# Patient Record
Sex: Female | Born: 1960 | Race: Black or African American | Hispanic: No | Marital: Single | Smoking: Current every day smoker
Health system: Southern US, Community
[De-identification: ages and names within clinical notes are randomized; demographics above are authoritative.]

## PROBLEM LIST (undated history)

## (undated) DIAGNOSIS — E1140 Type 2 diabetes mellitus with diabetic neuropathy, unspecified: Secondary | ICD-10-CM

## (undated) DIAGNOSIS — E669 Obesity, unspecified: Secondary | ICD-10-CM

## (undated) DIAGNOSIS — M76899 Other specified enthesopathies of unspecified lower limb, excluding foot: Secondary | ICD-10-CM

## (undated) DIAGNOSIS — I10 Essential (primary) hypertension: Secondary | ICD-10-CM

## (undated) DIAGNOSIS — E119 Type 2 diabetes mellitus without complications: Secondary | ICD-10-CM

## (undated) DIAGNOSIS — M2240 Chondromalacia patellae, unspecified knee: Secondary | ICD-10-CM

## (undated) DIAGNOSIS — M545 Low back pain, unspecified: Secondary | ICD-10-CM

## (undated) DIAGNOSIS — K219 Gastro-esophageal reflux disease without esophagitis: Secondary | ICD-10-CM

## (undated) DIAGNOSIS — F1910 Other psychoactive substance abuse, uncomplicated: Secondary | ICD-10-CM

## (undated) DIAGNOSIS — D126 Benign neoplasm of colon, unspecified: Secondary | ICD-10-CM

## (undated) DIAGNOSIS — I340 Nonrheumatic mitral (valve) insufficiency: Secondary | ICD-10-CM

## (undated) DIAGNOSIS — B182 Chronic viral hepatitis C: Secondary | ICD-10-CM

## (undated) DIAGNOSIS — G4700 Insomnia, unspecified: Secondary | ICD-10-CM

## (undated) DIAGNOSIS — M1990 Unspecified osteoarthritis, unspecified site: Secondary | ICD-10-CM

## (undated) DIAGNOSIS — F32A Depression, unspecified: Secondary | ICD-10-CM

## (undated) DIAGNOSIS — F419 Anxiety disorder, unspecified: Secondary | ICD-10-CM

## (undated) DIAGNOSIS — I499 Cardiac arrhythmia, unspecified: Secondary | ICD-10-CM

## (undated) DIAGNOSIS — I071 Rheumatic tricuspid insufficiency: Secondary | ICD-10-CM

## (undated) DIAGNOSIS — B1920 Unspecified viral hepatitis C without hepatic coma: Secondary | ICD-10-CM

## (undated) DIAGNOSIS — M542 Cervicalgia: Secondary | ICD-10-CM

## (undated) DIAGNOSIS — M797 Fibromyalgia: Secondary | ICD-10-CM

## (undated) DIAGNOSIS — M25529 Pain in unspecified elbow: Secondary | ICD-10-CM

## (undated) DIAGNOSIS — M1710 Unilateral primary osteoarthritis, unspecified knee: Secondary | ICD-10-CM

## (undated) DIAGNOSIS — F329 Major depressive disorder, single episode, unspecified: Secondary | ICD-10-CM

## (undated) DIAGNOSIS — G8929 Other chronic pain: Secondary | ICD-10-CM

## (undated) DIAGNOSIS — G894 Chronic pain syndrome: Secondary | ICD-10-CM

## (undated) DIAGNOSIS — Z720 Tobacco use: Secondary | ICD-10-CM

## (undated) DIAGNOSIS — I509 Heart failure, unspecified: Secondary | ICD-10-CM

## (undated) HISTORY — DX: Chondromalacia patellae, unspecified knee: M22.40

## (undated) HISTORY — DX: Obesity, unspecified: E66.9

## (undated) HISTORY — DX: Cervicalgia: M54.2

## (undated) HISTORY — DX: Tobacco use: Z72.0

## (undated) HISTORY — DX: Type 2 diabetes mellitus without complications: E11.9

## (undated) HISTORY — DX: Other psychoactive substance abuse, uncomplicated: F19.10

## (undated) HISTORY — DX: Essential (primary) hypertension: I10

## (undated) HISTORY — DX: Chronic pain syndrome: G89.4

## (undated) HISTORY — DX: Low back pain: M54.5

## (undated) HISTORY — DX: Major depressive disorder, single episode, unspecified: F32.9

## (undated) HISTORY — DX: Chronic viral hepatitis C: B18.2

## (undated) HISTORY — DX: Unspecified viral hepatitis C without hepatic coma: B19.20

## (undated) HISTORY — DX: Other chronic pain: G89.29

## (undated) HISTORY — DX: Low back pain, unspecified: M54.50

## (undated) HISTORY — PX: FOOT SURGERY: SHX648

## (undated) HISTORY — DX: Unilateral primary osteoarthritis, unspecified knee: M17.10

## (undated) HISTORY — PX: ABDOMINAL HYSTERECTOMY: SHX81

## (undated) HISTORY — DX: Insomnia, unspecified: G47.00

## (undated) HISTORY — DX: Other specified enthesopathies of unspecified lower limb, excluding foot: M76.899

## (undated) HISTORY — DX: Depression, unspecified: F32.A

## (undated) HISTORY — DX: Benign neoplasm of colon, unspecified: D12.6

## (undated) HISTORY — DX: Unspecified osteoarthritis, unspecified site: M19.90

## (undated) HISTORY — DX: Type 2 diabetes mellitus with diabetic neuropathy, unspecified: E11.40

## (undated) HISTORY — DX: Pain in unspecified elbow: M25.529

## (undated) HISTORY — PX: EYE SURGERY: SHX253

---

## 1998-05-03 ENCOUNTER — Emergency Department (HOSPITAL_COMMUNITY): Admission: EM | Admit: 1998-05-03 | Discharge: 1998-05-03 | Payer: Self-pay | Admitting: Emergency Medicine

## 1998-05-09 ENCOUNTER — Encounter: Admission: RE | Admit: 1998-05-09 | Discharge: 1998-05-09 | Payer: Self-pay | Admitting: Internal Medicine

## 1998-05-16 ENCOUNTER — Encounter: Admission: RE | Admit: 1998-05-16 | Discharge: 1998-05-16 | Payer: Self-pay | Admitting: Internal Medicine

## 1998-05-26 ENCOUNTER — Encounter: Admission: RE | Admit: 1998-05-26 | Discharge: 1998-05-26 | Payer: Self-pay | Admitting: Internal Medicine

## 1998-06-06 ENCOUNTER — Encounter: Admission: RE | Admit: 1998-06-06 | Discharge: 1998-06-06 | Payer: Self-pay | Admitting: Internal Medicine

## 1998-06-25 ENCOUNTER — Encounter: Admission: RE | Admit: 1998-06-25 | Discharge: 1998-06-25 | Payer: Self-pay | Admitting: Internal Medicine

## 1998-07-09 ENCOUNTER — Encounter: Admission: RE | Admit: 1998-07-09 | Discharge: 1998-07-09 | Payer: Self-pay

## 1998-12-24 ENCOUNTER — Encounter: Admission: RE | Admit: 1998-12-24 | Discharge: 1998-12-24 | Payer: Self-pay | Admitting: Hematology and Oncology

## 1999-02-27 ENCOUNTER — Encounter: Admission: RE | Admit: 1999-02-27 | Discharge: 1999-02-27 | Payer: Self-pay | Admitting: Internal Medicine

## 1999-03-03 ENCOUNTER — Encounter: Admission: RE | Admit: 1999-03-03 | Discharge: 1999-03-03 | Payer: Self-pay | Admitting: Internal Medicine

## 1999-05-22 ENCOUNTER — Encounter: Admission: RE | Admit: 1999-05-22 | Discharge: 1999-05-22 | Payer: Self-pay | Admitting: Internal Medicine

## 1999-10-28 ENCOUNTER — Ambulatory Visit (HOSPITAL_COMMUNITY): Admission: RE | Admit: 1999-10-28 | Discharge: 1999-10-28 | Payer: Self-pay | Admitting: Internal Medicine

## 1999-10-28 ENCOUNTER — Encounter: Payer: Self-pay | Admitting: Internal Medicine

## 1999-10-28 ENCOUNTER — Encounter: Admission: RE | Admit: 1999-10-28 | Discharge: 1999-10-28 | Payer: Self-pay | Admitting: Internal Medicine

## 1999-11-17 ENCOUNTER — Encounter: Admission: RE | Admit: 1999-11-17 | Discharge: 1999-11-17 | Payer: Self-pay | Admitting: Internal Medicine

## 2000-02-12 ENCOUNTER — Encounter: Admission: RE | Admit: 2000-02-12 | Discharge: 2000-02-12 | Payer: Self-pay | Admitting: Internal Medicine

## 2000-06-14 ENCOUNTER — Emergency Department (HOSPITAL_COMMUNITY): Admission: EM | Admit: 2000-06-14 | Discharge: 2000-06-14 | Payer: Self-pay | Admitting: *Deleted

## 2000-07-13 ENCOUNTER — Encounter: Admission: RE | Admit: 2000-07-13 | Discharge: 2000-07-13 | Payer: Self-pay | Admitting: Internal Medicine

## 2000-08-09 ENCOUNTER — Encounter: Admission: RE | Admit: 2000-08-09 | Discharge: 2000-08-09 | Payer: Self-pay | Admitting: Internal Medicine

## 2000-08-11 ENCOUNTER — Encounter: Admission: RE | Admit: 2000-08-11 | Discharge: 2000-11-09 | Payer: Self-pay | Admitting: Internal Medicine

## 2000-08-25 ENCOUNTER — Encounter: Admission: RE | Admit: 2000-08-25 | Discharge: 2000-09-20 | Payer: Self-pay | Admitting: Orthopedic Surgery

## 2000-08-30 ENCOUNTER — Encounter: Admission: RE | Admit: 2000-08-30 | Discharge: 2000-08-30 | Payer: Self-pay | Admitting: Internal Medicine

## 2000-08-30 ENCOUNTER — Encounter: Payer: Self-pay | Admitting: Internal Medicine

## 2000-08-30 ENCOUNTER — Ambulatory Visit (HOSPITAL_COMMUNITY): Admission: RE | Admit: 2000-08-30 | Discharge: 2000-08-30 | Payer: Self-pay | Admitting: Internal Medicine

## 2000-10-25 ENCOUNTER — Encounter: Admission: RE | Admit: 2000-10-25 | Discharge: 2000-10-25 | Payer: Self-pay | Admitting: Internal Medicine

## 2000-11-04 ENCOUNTER — Encounter: Admission: RE | Admit: 2000-11-04 | Discharge: 2000-11-04 | Payer: Self-pay | Admitting: Internal Medicine

## 2000-12-17 ENCOUNTER — Emergency Department (HOSPITAL_COMMUNITY): Admission: EM | Admit: 2000-12-17 | Discharge: 2000-12-17 | Payer: Self-pay | Admitting: Emergency Medicine

## 2000-12-19 ENCOUNTER — Encounter: Payer: Self-pay | Admitting: Internal Medicine

## 2000-12-19 ENCOUNTER — Ambulatory Visit (HOSPITAL_COMMUNITY): Admission: RE | Admit: 2000-12-19 | Discharge: 2000-12-19 | Payer: Self-pay | Admitting: Internal Medicine

## 2000-12-19 ENCOUNTER — Encounter: Admission: RE | Admit: 2000-12-19 | Discharge: 2000-12-19 | Payer: Self-pay | Admitting: Internal Medicine

## 2000-12-26 ENCOUNTER — Encounter: Admission: RE | Admit: 2000-12-26 | Discharge: 2001-03-26 | Payer: Self-pay | Admitting: Internal Medicine

## 2001-01-10 ENCOUNTER — Encounter: Admission: RE | Admit: 2001-01-10 | Discharge: 2001-01-10 | Payer: Self-pay | Admitting: Internal Medicine

## 2001-02-15 ENCOUNTER — Encounter: Payer: Self-pay | Admitting: Internal Medicine

## 2001-02-15 ENCOUNTER — Encounter: Admission: RE | Admit: 2001-02-15 | Discharge: 2001-02-15 | Payer: Self-pay | Admitting: Internal Medicine

## 2001-03-17 ENCOUNTER — Encounter: Admission: RE | Admit: 2001-03-17 | Discharge: 2001-03-17 | Payer: Self-pay

## 2001-03-28 ENCOUNTER — Encounter: Admission: RE | Admit: 2001-03-28 | Discharge: 2001-06-26 | Payer: Self-pay | Admitting: Internal Medicine

## 2001-05-16 ENCOUNTER — Encounter: Admission: RE | Admit: 2001-05-16 | Discharge: 2001-05-16 | Payer: Self-pay | Admitting: Internal Medicine

## 2001-05-26 ENCOUNTER — Encounter: Payer: Self-pay | Admitting: Internal Medicine

## 2001-05-26 ENCOUNTER — Ambulatory Visit (HOSPITAL_COMMUNITY): Admission: RE | Admit: 2001-05-26 | Discharge: 2001-05-26 | Payer: Self-pay | Admitting: Internal Medicine

## 2001-06-14 ENCOUNTER — Encounter: Admission: RE | Admit: 2001-06-14 | Discharge: 2001-06-14 | Payer: Self-pay | Admitting: Internal Medicine

## 2001-07-18 ENCOUNTER — Encounter: Admission: RE | Admit: 2001-07-18 | Discharge: 2001-10-16 | Payer: Self-pay | Admitting: Internal Medicine

## 2001-08-14 ENCOUNTER — Encounter: Admission: RE | Admit: 2001-08-14 | Discharge: 2001-08-14 | Payer: Self-pay | Admitting: Internal Medicine

## 2001-09-01 ENCOUNTER — Encounter: Admission: RE | Admit: 2001-09-01 | Discharge: 2001-09-01 | Payer: Self-pay | Admitting: Internal Medicine

## 2001-09-05 ENCOUNTER — Encounter: Admission: RE | Admit: 2001-09-05 | Discharge: 2001-10-06 | Payer: Self-pay | Admitting: Orthopedic Surgery

## 2001-12-20 ENCOUNTER — Encounter: Payer: Self-pay | Admitting: Orthopedic Surgery

## 2001-12-20 ENCOUNTER — Ambulatory Visit (HOSPITAL_COMMUNITY): Admission: RE | Admit: 2001-12-20 | Discharge: 2001-12-20 | Payer: Self-pay | Admitting: Orthopedic Surgery

## 2001-12-22 ENCOUNTER — Encounter: Admission: RE | Admit: 2001-12-22 | Discharge: 2002-03-22 | Payer: Self-pay | Admitting: Internal Medicine

## 2002-01-15 ENCOUNTER — Encounter: Admission: RE | Admit: 2002-01-15 | Discharge: 2002-01-15 | Payer: Self-pay | Admitting: Internal Medicine

## 2002-01-26 ENCOUNTER — Emergency Department (HOSPITAL_COMMUNITY): Admission: EM | Admit: 2002-01-26 | Discharge: 2002-01-26 | Payer: Self-pay | Admitting: Emergency Medicine

## 2002-03-28 ENCOUNTER — Encounter: Admission: RE | Admit: 2002-03-28 | Discharge: 2002-06-26 | Payer: Self-pay | Admitting: Internal Medicine

## 2002-07-03 ENCOUNTER — Encounter: Admission: RE | Admit: 2002-07-03 | Discharge: 2002-10-01 | Payer: Self-pay | Admitting: Internal Medicine

## 2002-12-25 ENCOUNTER — Encounter: Admission: RE | Admit: 2002-12-25 | Discharge: 2003-03-25 | Payer: Self-pay | Admitting: Internal Medicine

## 2003-06-10 ENCOUNTER — Emergency Department (HOSPITAL_COMMUNITY): Admission: EM | Admit: 2003-06-10 | Discharge: 2003-06-10 | Payer: Self-pay | Admitting: Emergency Medicine

## 2003-06-10 ENCOUNTER — Encounter: Payer: Self-pay | Admitting: Emergency Medicine

## 2003-08-30 ENCOUNTER — Encounter (INDEPENDENT_AMBULATORY_CARE_PROVIDER_SITE_OTHER): Payer: Self-pay | Admitting: Specialist

## 2003-08-30 ENCOUNTER — Ambulatory Visit (HOSPITAL_BASED_OUTPATIENT_CLINIC_OR_DEPARTMENT_OTHER): Admission: RE | Admit: 2003-08-30 | Discharge: 2003-08-30 | Payer: Self-pay | Admitting: Otolaryngology

## 2003-08-30 ENCOUNTER — Ambulatory Visit (HOSPITAL_COMMUNITY): Admission: RE | Admit: 2003-08-30 | Discharge: 2003-08-30 | Payer: Self-pay | Admitting: Otolaryngology

## 2004-02-06 ENCOUNTER — Ambulatory Visit (HOSPITAL_BASED_OUTPATIENT_CLINIC_OR_DEPARTMENT_OTHER): Admission: RE | Admit: 2004-02-06 | Discharge: 2004-02-06 | Payer: Self-pay | Admitting: Orthopedic Surgery

## 2007-02-27 ENCOUNTER — Encounter (INDEPENDENT_AMBULATORY_CARE_PROVIDER_SITE_OTHER): Payer: Self-pay | Admitting: *Deleted

## 2007-02-27 ENCOUNTER — Ambulatory Visit: Payer: Self-pay | Admitting: Internal Medicine

## 2007-02-27 DIAGNOSIS — I152 Hypertension secondary to endocrine disorders: Secondary | ICD-10-CM | POA: Insufficient documentation

## 2007-02-27 DIAGNOSIS — E1169 Type 2 diabetes mellitus with other specified complication: Secondary | ICD-10-CM | POA: Insufficient documentation

## 2007-02-27 DIAGNOSIS — I10 Essential (primary) hypertension: Secondary | ICD-10-CM | POA: Insufficient documentation

## 2007-02-27 DIAGNOSIS — B1710 Acute hepatitis C without hepatic coma: Secondary | ICD-10-CM | POA: Insufficient documentation

## 2007-02-27 DIAGNOSIS — F329 Major depressive disorder, single episode, unspecified: Secondary | ICD-10-CM | POA: Insufficient documentation

## 2007-02-27 DIAGNOSIS — N898 Other specified noninflammatory disorders of vagina: Secondary | ICD-10-CM | POA: Insufficient documentation

## 2007-02-27 DIAGNOSIS — F3289 Other specified depressive episodes: Secondary | ICD-10-CM | POA: Insufficient documentation

## 2007-02-27 DIAGNOSIS — E119 Type 2 diabetes mellitus without complications: Secondary | ICD-10-CM | POA: Insufficient documentation

## 2007-02-27 LAB — CONVERTED CEMR LAB
AST: 44 units/L — ABNORMAL HIGH (ref 0–37)
Albumin: 4.1 g/dL (ref 3.5–5.2)
BUN: 19 mg/dL (ref 6–23)
Basophils Relative: 0 % (ref 0–1)
Blood Glucose, Fingerstick: 359
Blood in Urine, dipstick: NEGATIVE
Candida species: NEGATIVE
Chloride: 102 meq/L (ref 96–112)
Eosinophils Relative: 2 % (ref 0–5)
Glucose, Bld: 299 mg/dL — ABNORMAL HIGH (ref 70–99)
Hemoglobin: 12.9 g/dL (ref 12.0–15.0)
Hep B Core Total Ab: POSITIVE — AB
Hep B S Ab: POSITIVE — AB
Hgb A1c MFr Bld: 11 %
Lymphs Abs: 3.4 10*3/uL — ABNORMAL HIGH (ref 0.7–3.3)
Monocytes Absolute: 0.3 10*3/uL (ref 0.2–0.7)
Neutro Abs: 2.4 10*3/uL (ref 1.7–7.7)
Nitrite: NEGATIVE
RBC: 4.47 M/uL (ref 3.87–5.11)
RDW: 13.6 % (ref 11.5–14.0)
Specific Gravity, Urine: >=1.03
Total Bilirubin: 0.5 mg/dL (ref 0.3–1.2)
Total Protein: 7.2 g/dL (ref 6.0–8.3)
Trichomonal Vaginitis: NEGATIVE
WBC Urine, dipstick: NEGATIVE
pH: 5

## 2007-02-28 ENCOUNTER — Telehealth (INDEPENDENT_AMBULATORY_CARE_PROVIDER_SITE_OTHER): Payer: Self-pay | Admitting: *Deleted

## 2007-03-22 ENCOUNTER — Encounter (INDEPENDENT_AMBULATORY_CARE_PROVIDER_SITE_OTHER): Payer: Self-pay | Admitting: *Deleted

## 2007-03-22 ENCOUNTER — Ambulatory Visit: Payer: Self-pay | Admitting: Internal Medicine

## 2007-03-22 ENCOUNTER — Telehealth: Payer: Self-pay | Admitting: *Deleted

## 2007-03-22 LAB — CONVERTED CEMR LAB
BUN: 14 mg/dL (ref 6–23)
CO2: 24 meq/L (ref 19–32)
Calcium: 9.2 mg/dL (ref 8.4–10.5)
Chloride: 100 meq/L (ref 96–112)
Creatinine, Ser: 0.91 mg/dL (ref 0.40–1.20)

## 2007-03-23 ENCOUNTER — Encounter (INDEPENDENT_AMBULATORY_CARE_PROVIDER_SITE_OTHER): Payer: Self-pay | Admitting: *Deleted

## 2007-03-27 ENCOUNTER — Telehealth (INDEPENDENT_AMBULATORY_CARE_PROVIDER_SITE_OTHER): Payer: Self-pay | Admitting: *Deleted

## 2007-03-29 ENCOUNTER — Ambulatory Visit: Payer: Self-pay | Admitting: Internal Medicine

## 2007-03-29 ENCOUNTER — Encounter (INDEPENDENT_AMBULATORY_CARE_PROVIDER_SITE_OTHER): Payer: Self-pay | Admitting: *Deleted

## 2007-03-29 DIAGNOSIS — K117 Disturbances of salivary secretion: Secondary | ICD-10-CM | POA: Insufficient documentation

## 2007-03-29 LAB — CONVERTED CEMR LAB
BUN: 14 mg/dL (ref 6–23)
Bilirubin, Direct: 0.2 mg/dL (ref 0.0–0.3)
Calcium: 8.3 mg/dL — ABNORMAL LOW (ref 8.4–10.5)
Creatinine, Ser: 0.9 mg/dL (ref 0.40–1.20)
Total Bilirubin: 0.8 mg/dL (ref 0.3–1.2)

## 2007-04-06 ENCOUNTER — Ambulatory Visit: Payer: Self-pay | Admitting: Internal Medicine

## 2007-04-06 LAB — CONVERTED CEMR LAB: Blood Glucose, Fingerstick: 109

## 2007-04-18 ENCOUNTER — Encounter (INDEPENDENT_AMBULATORY_CARE_PROVIDER_SITE_OTHER): Payer: Self-pay | Admitting: Internal Medicine

## 2007-04-18 ENCOUNTER — Ambulatory Visit: Payer: Self-pay | Admitting: Hospitalist

## 2007-04-18 LAB — CONVERTED CEMR LAB
Blood Glucose, Fingerstick: 204
Creatinine, Urine: 88.5 mg/dL
Microalb Creat Ratio: 4.7 mg/g (ref 0.0–30.0)
Microalb, Ur: 0.42 mg/dL (ref 0.00–1.89)

## 2007-04-24 ENCOUNTER — Telehealth (INDEPENDENT_AMBULATORY_CARE_PROVIDER_SITE_OTHER): Payer: Self-pay | Admitting: *Deleted

## 2007-04-27 ENCOUNTER — Ambulatory Visit: Payer: Self-pay | Admitting: Internal Medicine

## 2007-04-28 ENCOUNTER — Telehealth (INDEPENDENT_AMBULATORY_CARE_PROVIDER_SITE_OTHER): Payer: Self-pay | Admitting: *Deleted

## 2007-05-08 ENCOUNTER — Ambulatory Visit (HOSPITAL_COMMUNITY): Admission: RE | Admit: 2007-05-08 | Discharge: 2007-05-08 | Payer: Self-pay | Admitting: Internal Medicine

## 2007-05-08 ENCOUNTER — Encounter (INDEPENDENT_AMBULATORY_CARE_PROVIDER_SITE_OTHER): Payer: Self-pay | Admitting: *Deleted

## 2007-05-08 ENCOUNTER — Ambulatory Visit: Payer: Self-pay | Admitting: Internal Medicine

## 2007-05-08 DIAGNOSIS — E1149 Type 2 diabetes mellitus with other diabetic neurological complication: Secondary | ICD-10-CM | POA: Insufficient documentation

## 2007-05-08 LAB — CONVERTED CEMR LAB: Hgb A1c MFr Bld: 9.1 %

## 2007-05-23 ENCOUNTER — Telehealth (INDEPENDENT_AMBULATORY_CARE_PROVIDER_SITE_OTHER): Payer: Self-pay | Admitting: *Deleted

## 2007-05-23 ENCOUNTER — Encounter (INDEPENDENT_AMBULATORY_CARE_PROVIDER_SITE_OTHER): Payer: Self-pay | Admitting: *Deleted

## 2007-05-23 ENCOUNTER — Ambulatory Visit: Payer: Self-pay | Admitting: Gastroenterology

## 2007-05-26 ENCOUNTER — Ambulatory Visit: Payer: Self-pay | Admitting: Internal Medicine

## 2007-05-26 ENCOUNTER — Encounter (INDEPENDENT_AMBULATORY_CARE_PROVIDER_SITE_OTHER): Payer: Self-pay | Admitting: *Deleted

## 2007-05-31 ENCOUNTER — Telehealth (INDEPENDENT_AMBULATORY_CARE_PROVIDER_SITE_OTHER): Payer: Self-pay | Admitting: *Deleted

## 2007-06-06 ENCOUNTER — Encounter: Payer: Self-pay | Admitting: Sports Medicine

## 2007-06-07 ENCOUNTER — Ambulatory Visit (HOSPITAL_COMMUNITY): Admission: RE | Admit: 2007-06-07 | Discharge: 2007-06-07 | Payer: Self-pay | Admitting: Obstetrics and Gynecology

## 2007-06-08 ENCOUNTER — Ambulatory Visit: Payer: Self-pay | Admitting: *Deleted

## 2007-06-08 ENCOUNTER — Ambulatory Visit (HOSPITAL_COMMUNITY): Admission: RE | Admit: 2007-06-08 | Discharge: 2007-06-08 | Payer: Self-pay | Admitting: Internal Medicine

## 2007-06-08 ENCOUNTER — Ambulatory Visit: Payer: Self-pay | Admitting: Internal Medicine

## 2007-06-08 DIAGNOSIS — G5600 Carpal tunnel syndrome, unspecified upper limb: Secondary | ICD-10-CM | POA: Insufficient documentation

## 2007-06-08 DIAGNOSIS — N951 Menopausal and female climacteric states: Secondary | ICD-10-CM | POA: Insufficient documentation

## 2007-06-08 LAB — CONVERTED CEMR LAB: Blood Glucose, Fingerstick: 63

## 2007-06-12 ENCOUNTER — Telehealth: Payer: Self-pay | Admitting: Internal Medicine

## 2007-06-15 ENCOUNTER — Encounter (INDEPENDENT_AMBULATORY_CARE_PROVIDER_SITE_OTHER): Payer: Self-pay | Admitting: *Deleted

## 2007-06-15 ENCOUNTER — Telehealth: Payer: Self-pay | Admitting: *Deleted

## 2007-06-19 ENCOUNTER — Telehealth (INDEPENDENT_AMBULATORY_CARE_PROVIDER_SITE_OTHER): Payer: Self-pay | Admitting: *Deleted

## 2007-06-22 ENCOUNTER — Telehealth: Payer: Self-pay | Admitting: *Deleted

## 2007-06-23 ENCOUNTER — Ambulatory Visit: Payer: Self-pay | Admitting: Hospitalist

## 2007-06-23 ENCOUNTER — Encounter (INDEPENDENT_AMBULATORY_CARE_PROVIDER_SITE_OTHER): Payer: Self-pay | Admitting: *Deleted

## 2007-06-23 LAB — CONVERTED CEMR LAB
BUN: 22 mg/dL (ref 6–23)
Calcium: 9 mg/dL (ref 8.4–10.5)
Creatinine, Ser: 1.17 mg/dL (ref 0.40–1.20)

## 2007-07-11 ENCOUNTER — Ambulatory Visit: Payer: Self-pay | Admitting: Sports Medicine

## 2007-07-11 DIAGNOSIS — M722 Plantar fascial fibromatosis: Secondary | ICD-10-CM | POA: Insufficient documentation

## 2007-07-17 ENCOUNTER — Telehealth: Payer: Self-pay | Admitting: *Deleted

## 2007-07-19 ENCOUNTER — Telehealth: Payer: Self-pay | Admitting: Infectious Disease

## 2007-08-04 ENCOUNTER — Telehealth: Payer: Self-pay | Admitting: Licensed Clinical Social Worker

## 2007-08-10 ENCOUNTER — Telehealth: Payer: Self-pay | Admitting: *Deleted

## 2007-08-18 ENCOUNTER — Ambulatory Visit: Payer: Self-pay | Admitting: Sports Medicine

## 2007-08-21 ENCOUNTER — Telehealth (INDEPENDENT_AMBULATORY_CARE_PROVIDER_SITE_OTHER): Payer: Self-pay | Admitting: *Deleted

## 2007-08-22 ENCOUNTER — Telehealth (INDEPENDENT_AMBULATORY_CARE_PROVIDER_SITE_OTHER): Payer: Self-pay | Admitting: *Deleted

## 2007-08-23 ENCOUNTER — Ambulatory Visit: Payer: Self-pay | Admitting: Sports Medicine

## 2007-08-30 ENCOUNTER — Encounter (INDEPENDENT_AMBULATORY_CARE_PROVIDER_SITE_OTHER): Payer: Self-pay | Admitting: *Deleted

## 2007-08-31 ENCOUNTER — Encounter (INDEPENDENT_AMBULATORY_CARE_PROVIDER_SITE_OTHER): Payer: Self-pay | Admitting: Internal Medicine

## 2007-08-31 ENCOUNTER — Ambulatory Visit: Payer: Self-pay | Admitting: Internal Medicine

## 2007-08-31 DIAGNOSIS — S2092XA Blister (nonthermal) of unspecified parts of thorax, initial encounter: Secondary | ICD-10-CM | POA: Insufficient documentation

## 2007-08-31 DIAGNOSIS — K146 Glossodynia: Secondary | ICD-10-CM | POA: Insufficient documentation

## 2007-08-31 DIAGNOSIS — H5710 Ocular pain, unspecified eye: Secondary | ICD-10-CM | POA: Insufficient documentation

## 2007-08-31 LAB — CONVERTED CEMR LAB: Blood Glucose, Fingerstick: 182

## 2007-09-01 ENCOUNTER — Telehealth: Payer: Self-pay | Admitting: *Deleted

## 2007-09-01 LAB — CONVERTED CEMR LAB
ALT: 44 units/L — ABNORMAL HIGH (ref 0–35)
AST: 34 units/L (ref 0–37)
CO2: 22 meq/L (ref 19–32)
Calcium: 9.4 mg/dL (ref 8.4–10.5)
Chloride: 103 meq/L (ref 96–112)
Creatinine, Ser: 1.04 mg/dL (ref 0.40–1.20)
HCT: 41.6 % (ref 36.0–46.0)
Platelets: 218 10*3/uL (ref 150–400)
RDW: 12.6 % (ref 11.5–14.0)
Sodium: 140 meq/L (ref 135–145)
TSH: 4.638 microintl units/mL (ref 0.350–5.50)
Total Bilirubin: 0.6 mg/dL (ref 0.3–1.2)
Total Protein: 7.7 g/dL (ref 6.0–8.3)
WBC: 8.5 10*3/uL (ref 4.0–10.5)

## 2007-09-07 ENCOUNTER — Telehealth: Payer: Self-pay | Admitting: Infectious Diseases

## 2007-09-08 ENCOUNTER — Telehealth: Payer: Self-pay | Admitting: *Deleted

## 2007-09-08 ENCOUNTER — Encounter (INDEPENDENT_AMBULATORY_CARE_PROVIDER_SITE_OTHER): Payer: Self-pay | Admitting: *Deleted

## 2007-09-14 ENCOUNTER — Ambulatory Visit: Payer: Self-pay | Admitting: *Deleted

## 2007-09-14 ENCOUNTER — Encounter (INDEPENDENT_AMBULATORY_CARE_PROVIDER_SITE_OTHER): Payer: Self-pay | Admitting: Infectious Diseases

## 2007-09-14 LAB — CONVERTED CEMR LAB
Anti Nuclear Antibody(ANA): NEGATIVE
Blood Glucose, Fingerstick: 87
Sed Rate: 16 mm/hr (ref 0–22)

## 2007-09-21 ENCOUNTER — Telehealth: Payer: Self-pay | Admitting: *Deleted

## 2007-09-21 ENCOUNTER — Telehealth (INDEPENDENT_AMBULATORY_CARE_PROVIDER_SITE_OTHER): Payer: Self-pay | Admitting: *Deleted

## 2007-09-21 ENCOUNTER — Emergency Department (HOSPITAL_COMMUNITY): Admission: EM | Admit: 2007-09-21 | Discharge: 2007-09-21 | Payer: Self-pay | Admitting: Emergency Medicine

## 2007-09-22 ENCOUNTER — Telehealth (INDEPENDENT_AMBULATORY_CARE_PROVIDER_SITE_OTHER): Payer: Self-pay | Admitting: *Deleted

## 2007-09-22 ENCOUNTER — Telehealth: Payer: Self-pay | Admitting: *Deleted

## 2007-09-25 ENCOUNTER — Telehealth: Payer: Self-pay | Admitting: *Deleted

## 2007-09-26 ENCOUNTER — Telehealth: Payer: Self-pay | Admitting: *Deleted

## 2007-09-30 ENCOUNTER — Encounter (INDEPENDENT_AMBULATORY_CARE_PROVIDER_SITE_OTHER): Payer: Self-pay | Admitting: *Deleted

## 2007-10-03 ENCOUNTER — Ambulatory Visit: Payer: Self-pay | Admitting: Sports Medicine

## 2007-10-03 ENCOUNTER — Encounter: Payer: Self-pay | Admitting: Internal Medicine

## 2007-10-03 DIAGNOSIS — B369 Superficial mycosis, unspecified: Secondary | ICD-10-CM | POA: Insufficient documentation

## 2007-10-05 ENCOUNTER — Encounter (INDEPENDENT_AMBULATORY_CARE_PROVIDER_SITE_OTHER): Payer: Self-pay | Admitting: *Deleted

## 2007-10-05 ENCOUNTER — Ambulatory Visit: Payer: Self-pay | Admitting: *Deleted

## 2007-10-05 LAB — CONVERTED CEMR LAB
ALT: 33 units/L (ref 0–35)
AST: 31 units/L (ref 0–37)
Alkaline Phosphatase: 87 units/L (ref 39–117)
Creatinine, Ser: 1.07 mg/dL (ref 0.40–1.20)
Sodium: 140 meq/L (ref 135–145)
Total Bilirubin: 0.4 mg/dL (ref 0.3–1.2)
Total Protein: 7.8 g/dL (ref 6.0–8.3)

## 2007-10-06 ENCOUNTER — Ambulatory Visit (HOSPITAL_COMMUNITY): Admission: RE | Admit: 2007-10-06 | Discharge: 2007-10-06 | Payer: Self-pay | Admitting: Gastroenterology

## 2007-10-06 ENCOUNTER — Encounter (INDEPENDENT_AMBULATORY_CARE_PROVIDER_SITE_OTHER): Payer: Self-pay | Admitting: Diagnostic Radiology

## 2007-10-11 ENCOUNTER — Encounter (INDEPENDENT_AMBULATORY_CARE_PROVIDER_SITE_OTHER): Payer: Self-pay | Admitting: *Deleted

## 2007-10-19 ENCOUNTER — Telehealth (INDEPENDENT_AMBULATORY_CARE_PROVIDER_SITE_OTHER): Payer: Self-pay | Admitting: *Deleted

## 2007-10-20 ENCOUNTER — Telehealth (INDEPENDENT_AMBULATORY_CARE_PROVIDER_SITE_OTHER): Payer: Self-pay | Admitting: *Deleted

## 2007-11-14 ENCOUNTER — Telehealth (INDEPENDENT_AMBULATORY_CARE_PROVIDER_SITE_OTHER): Payer: Self-pay | Admitting: *Deleted

## 2007-11-14 ENCOUNTER — Telehealth: Payer: Self-pay | Admitting: *Deleted

## 2007-11-14 ENCOUNTER — Ambulatory Visit: Payer: Self-pay | Admitting: Internal Medicine

## 2007-11-14 ENCOUNTER — Ambulatory Visit: Payer: Self-pay | Admitting: Sports Medicine

## 2007-11-27 ENCOUNTER — Telehealth: Payer: Self-pay | Admitting: *Deleted

## 2007-12-15 ENCOUNTER — Ambulatory Visit: Payer: Self-pay | Admitting: Infectious Disease

## 2007-12-15 ENCOUNTER — Ambulatory Visit: Payer: Self-pay | Admitting: Sports Medicine

## 2007-12-15 ENCOUNTER — Ambulatory Visit (HOSPITAL_COMMUNITY): Admission: RE | Admit: 2007-12-15 | Discharge: 2007-12-15 | Payer: Self-pay | Admitting: Internal Medicine

## 2007-12-15 DIAGNOSIS — M542 Cervicalgia: Secondary | ICD-10-CM | POA: Insufficient documentation

## 2007-12-15 DIAGNOSIS — G894 Chronic pain syndrome: Secondary | ICD-10-CM | POA: Insufficient documentation

## 2007-12-27 ENCOUNTER — Encounter (HOSPITAL_BASED_OUTPATIENT_CLINIC_OR_DEPARTMENT_OTHER): Admission: RE | Admit: 2007-12-27 | Discharge: 2008-02-15 | Payer: Self-pay | Admitting: Surgery

## 2008-01-01 ENCOUNTER — Encounter (INDEPENDENT_AMBULATORY_CARE_PROVIDER_SITE_OTHER): Payer: Self-pay | Admitting: *Deleted

## 2008-01-02 ENCOUNTER — Ambulatory Visit: Payer: Self-pay | Admitting: Gastroenterology

## 2008-01-08 ENCOUNTER — Telehealth: Payer: Self-pay | Admitting: *Deleted

## 2008-01-15 ENCOUNTER — Encounter: Admission: RE | Admit: 2008-01-15 | Discharge: 2008-01-15 | Payer: Self-pay | Admitting: Gastroenterology

## 2008-01-18 ENCOUNTER — Telehealth: Payer: Self-pay | Admitting: *Deleted

## 2008-01-18 ENCOUNTER — Ambulatory Visit: Payer: Self-pay | Admitting: Internal Medicine

## 2008-01-18 DIAGNOSIS — G4700 Insomnia, unspecified: Secondary | ICD-10-CM | POA: Insufficient documentation

## 2008-01-26 ENCOUNTER — Encounter (INDEPENDENT_AMBULATORY_CARE_PROVIDER_SITE_OTHER): Payer: Self-pay | Admitting: *Deleted

## 2008-01-26 ENCOUNTER — Ambulatory Visit: Payer: Self-pay | Admitting: Internal Medicine

## 2008-01-26 ENCOUNTER — Encounter: Payer: Self-pay | Admitting: Licensed Clinical Social Worker

## 2008-01-26 LAB — CONVERTED CEMR LAB: Blood Glucose, Fingerstick: 186

## 2008-01-31 ENCOUNTER — Telehealth: Payer: Self-pay | Admitting: *Deleted

## 2008-02-06 ENCOUNTER — Encounter (INDEPENDENT_AMBULATORY_CARE_PROVIDER_SITE_OTHER): Payer: Self-pay | Admitting: *Deleted

## 2008-02-12 ENCOUNTER — Telehealth (INDEPENDENT_AMBULATORY_CARE_PROVIDER_SITE_OTHER): Payer: Self-pay | Admitting: *Deleted

## 2008-02-14 ENCOUNTER — Telehealth (INDEPENDENT_AMBULATORY_CARE_PROVIDER_SITE_OTHER): Payer: Self-pay | Admitting: *Deleted

## 2008-02-20 ENCOUNTER — Telehealth: Payer: Self-pay | Admitting: *Deleted

## 2008-02-22 ENCOUNTER — Encounter: Payer: Self-pay | Admitting: Internal Medicine

## 2008-02-22 ENCOUNTER — Ambulatory Visit (HOSPITAL_COMMUNITY): Admission: RE | Admit: 2008-02-22 | Discharge: 2008-02-22 | Payer: Self-pay | Admitting: Hospitalist

## 2008-02-22 ENCOUNTER — Encounter (INDEPENDENT_AMBULATORY_CARE_PROVIDER_SITE_OTHER): Payer: Self-pay | Admitting: *Deleted

## 2008-02-22 ENCOUNTER — Ambulatory Visit: Payer: Self-pay | Admitting: Hospitalist

## 2008-02-22 DIAGNOSIS — R000 Tachycardia, unspecified: Secondary | ICD-10-CM | POA: Insufficient documentation

## 2008-02-22 LAB — CONVERTED CEMR LAB
ALT: 41 units/L — ABNORMAL HIGH (ref 0–35)
AST: 39 units/L — ABNORMAL HIGH (ref 0–37)
Alkaline Phosphatase: 100 units/L (ref 39–117)
Blood Glucose, Fingerstick: 174
Creatinine, Ser: 1.25 mg/dL — ABNORMAL HIGH (ref 0.40–1.20)
HDL: 48 mg/dL (ref >39)
Hgb A1c MFr Bld: 7.5 %
Total Bilirubin: 0.5 mg/dL (ref 0.3–1.2)
Total CHOL/HDL Ratio: 3.8
VLDL: 32 mg/dL (ref 0–40)

## 2008-02-27 ENCOUNTER — Ambulatory Visit: Payer: Self-pay | Admitting: Sports Medicine

## 2008-02-29 ENCOUNTER — Ambulatory Visit: Payer: Self-pay | Admitting: *Deleted

## 2008-03-18 ENCOUNTER — Encounter (INDEPENDENT_AMBULATORY_CARE_PROVIDER_SITE_OTHER): Payer: Self-pay | Admitting: *Deleted

## 2008-03-21 ENCOUNTER — Ambulatory Visit: Payer: Self-pay | Admitting: Gastroenterology

## 2008-03-22 ENCOUNTER — Telehealth: Payer: Self-pay | Admitting: *Deleted

## 2008-04-02 ENCOUNTER — Encounter (INDEPENDENT_AMBULATORY_CARE_PROVIDER_SITE_OTHER): Payer: Self-pay | Admitting: *Deleted

## 2008-04-02 ENCOUNTER — Ambulatory Visit: Payer: Self-pay | Admitting: Hospitalist

## 2008-04-02 LAB — CONVERTED CEMR LAB: Blood Glucose, Fingerstick: 147

## 2008-04-05 LAB — CONVERTED CEMR LAB
ALT: 38 units/L — ABNORMAL HIGH (ref 0–35)
Alkaline Phosphatase: 92 units/L (ref 39–117)
Creatinine, Ser: 1.14 mg/dL (ref 0.40–1.20)
Glucose, Bld: 78 mg/dL (ref 70–99)
Sodium: 140 meq/L (ref 135–145)
Total Bilirubin: 0.4 mg/dL (ref 0.3–1.2)
Total Protein: 7.8 g/dL (ref 6.0–8.3)

## 2008-04-08 ENCOUNTER — Telehealth (INDEPENDENT_AMBULATORY_CARE_PROVIDER_SITE_OTHER): Payer: Self-pay | Admitting: *Deleted

## 2008-04-08 ENCOUNTER — Encounter (INDEPENDENT_AMBULATORY_CARE_PROVIDER_SITE_OTHER): Payer: Self-pay | Admitting: *Deleted

## 2008-05-01 ENCOUNTER — Telehealth (INDEPENDENT_AMBULATORY_CARE_PROVIDER_SITE_OTHER): Payer: Self-pay | Admitting: *Deleted

## 2008-05-08 ENCOUNTER — Ambulatory Visit: Payer: Self-pay | Admitting: Internal Medicine

## 2008-05-08 ENCOUNTER — Encounter (INDEPENDENT_AMBULATORY_CARE_PROVIDER_SITE_OTHER): Payer: Self-pay | Admitting: *Deleted

## 2008-05-09 ENCOUNTER — Telehealth: Payer: Self-pay

## 2008-05-09 LAB — CONVERTED CEMR LAB
CO2: 24 meq/L (ref 19–32)
Calcium: 9.7 mg/dL (ref 8.4–10.5)
Chloride: 103 meq/L (ref 96–112)
Glucose, Bld: 158 mg/dL — ABNORMAL HIGH (ref 70–99)
Sodium: 142 meq/L (ref 135–145)

## 2008-05-10 ENCOUNTER — Encounter (INDEPENDENT_AMBULATORY_CARE_PROVIDER_SITE_OTHER): Payer: Self-pay | Admitting: *Deleted

## 2008-05-23 ENCOUNTER — Encounter (INDEPENDENT_AMBULATORY_CARE_PROVIDER_SITE_OTHER): Payer: Self-pay | Admitting: *Deleted

## 2008-06-14 ENCOUNTER — Ambulatory Visit (HOSPITAL_COMMUNITY): Admission: RE | Admit: 2008-06-14 | Discharge: 2008-06-14 | Payer: Self-pay | Admitting: *Deleted

## 2008-06-24 ENCOUNTER — Ambulatory Visit: Payer: Self-pay | Admitting: *Deleted

## 2008-06-24 ENCOUNTER — Encounter: Payer: Self-pay | Admitting: Internal Medicine

## 2008-06-24 LAB — CONVERTED CEMR LAB
Creatinine, Urine: 259.4 mg/dL
Microalb, Ur: 1.7 mg/dL (ref 0.00–1.89)

## 2008-06-25 ENCOUNTER — Encounter: Payer: Self-pay | Admitting: Internal Medicine

## 2008-06-26 ENCOUNTER — Encounter: Payer: Self-pay | Admitting: Internal Medicine

## 2008-07-02 ENCOUNTER — Telehealth (INDEPENDENT_AMBULATORY_CARE_PROVIDER_SITE_OTHER): Payer: Self-pay | Admitting: *Deleted

## 2008-07-03 ENCOUNTER — Encounter: Payer: Self-pay | Admitting: Licensed Clinical Social Worker

## 2008-07-09 ENCOUNTER — Telehealth: Payer: Self-pay | Admitting: *Deleted

## 2008-07-10 ENCOUNTER — Telehealth: Payer: Self-pay | Admitting: Internal Medicine

## 2008-07-29 ENCOUNTER — Ambulatory Visit: Payer: Self-pay | Admitting: Internal Medicine

## 2008-07-29 LAB — CONVERTED CEMR LAB: Blood Glucose, Fingerstick: 204

## 2008-08-05 ENCOUNTER — Ambulatory Visit: Payer: Self-pay | Admitting: Family Medicine

## 2008-08-05 ENCOUNTER — Encounter: Admission: RE | Admit: 2008-08-05 | Discharge: 2008-08-05 | Payer: Self-pay | Admitting: Internal Medicine

## 2008-08-06 ENCOUNTER — Encounter: Payer: Self-pay | Admitting: Family Medicine

## 2008-08-07 ENCOUNTER — Telehealth (INDEPENDENT_AMBULATORY_CARE_PROVIDER_SITE_OTHER): Payer: Self-pay | Admitting: *Deleted

## 2008-08-15 ENCOUNTER — Ambulatory Visit: Payer: Self-pay | Admitting: Internal Medicine

## 2008-08-15 ENCOUNTER — Encounter: Payer: Self-pay | Admitting: Internal Medicine

## 2008-08-15 ENCOUNTER — Encounter (INDEPENDENT_AMBULATORY_CARE_PROVIDER_SITE_OTHER): Payer: Self-pay | Admitting: Internal Medicine

## 2008-08-15 LAB — CONVERTED CEMR LAB
Blood Glucose, Fingerstick: 134
CO2: 23 meq/L (ref 19–32)
Calcium: 9.2 mg/dL (ref 8.4–10.5)
Creatinine, Ser: 0.91 mg/dL (ref 0.40–1.20)
Sodium: 140 meq/L (ref 135–145)

## 2008-08-26 ENCOUNTER — Telehealth: Payer: Self-pay | Admitting: *Deleted

## 2008-08-28 ENCOUNTER — Telehealth: Payer: Self-pay | Admitting: *Deleted

## 2008-08-30 ENCOUNTER — Ambulatory Visit: Payer: Self-pay | Admitting: Internal Medicine

## 2008-09-04 ENCOUNTER — Encounter: Admission: RE | Admit: 2008-09-04 | Discharge: 2008-09-05 | Payer: Self-pay | Admitting: Internal Medicine

## 2008-09-11 ENCOUNTER — Encounter: Admission: RE | Admit: 2008-09-11 | Discharge: 2008-11-20 | Payer: Self-pay | Admitting: Podiatry

## 2008-11-08 ENCOUNTER — Encounter: Payer: Self-pay | Admitting: Internal Medicine

## 2008-12-12 ENCOUNTER — Encounter: Payer: Self-pay | Admitting: Internal Medicine

## 2008-12-24 ENCOUNTER — Telehealth (INDEPENDENT_AMBULATORY_CARE_PROVIDER_SITE_OTHER): Payer: Self-pay | Admitting: *Deleted

## 2009-01-09 ENCOUNTER — Ambulatory Visit: Payer: Self-pay | Admitting: Sports Medicine

## 2009-01-09 ENCOUNTER — Ambulatory Visit: Payer: Self-pay | Admitting: *Deleted

## 2009-01-09 ENCOUNTER — Encounter: Payer: Self-pay | Admitting: Internal Medicine

## 2009-01-09 ENCOUNTER — Encounter: Admission: RE | Admit: 2009-01-09 | Discharge: 2009-01-09 | Payer: Self-pay | Admitting: Sports Medicine

## 2009-01-09 LAB — CONVERTED CEMR LAB
Blood Glucose, Fingerstick: 140
CO2: 23 meq/L (ref 19–32)
Chloride: 101 meq/L (ref 96–112)
Glucose, Bld: 95 mg/dL (ref 70–99)
HDL: 62 mg/dL (ref >39)
Hemoglobin, Urine: NEGATIVE
Hgb A1c MFr Bld: 5.7 %
LDL Cholesterol: 75 mg/dL (ref 0–99)
Nitrite: NEGATIVE
Protein, ur: NEGATIVE mg/dL
Sodium: 139 meq/L (ref 135–145)
Total CHOL/HDL Ratio: 2.6
Urobilinogen, UA: 1 (ref 0.0–1.0)

## 2009-01-13 ENCOUNTER — Telehealth: Payer: Self-pay | Admitting: Internal Medicine

## 2009-02-06 ENCOUNTER — Telehealth: Payer: Self-pay | Admitting: Internal Medicine

## 2009-02-07 ENCOUNTER — Encounter: Payer: Self-pay | Admitting: Internal Medicine

## 2009-02-07 ENCOUNTER — Telehealth: Payer: Self-pay | Admitting: Internal Medicine

## 2009-03-04 ENCOUNTER — Telehealth: Payer: Self-pay | Admitting: *Deleted

## 2009-03-24 ENCOUNTER — Ambulatory Visit: Payer: Self-pay | Admitting: Internal Medicine

## 2009-03-24 DIAGNOSIS — K029 Dental caries, unspecified: Secondary | ICD-10-CM | POA: Insufficient documentation

## 2009-03-26 ENCOUNTER — Encounter: Payer: Self-pay | Admitting: Internal Medicine

## 2009-03-31 ENCOUNTER — Telehealth: Payer: Self-pay | Admitting: Internal Medicine

## 2009-04-01 ENCOUNTER — Telehealth: Payer: Self-pay | Admitting: Internal Medicine

## 2009-04-02 ENCOUNTER — Telehealth: Payer: Self-pay | Admitting: Internal Medicine

## 2009-04-08 ENCOUNTER — Ambulatory Visit: Payer: Self-pay | Admitting: Sports Medicine

## 2009-04-14 ENCOUNTER — Telehealth: Payer: Self-pay | Admitting: Internal Medicine

## 2009-05-01 ENCOUNTER — Telehealth: Payer: Self-pay | Admitting: Internal Medicine

## 2009-05-14 ENCOUNTER — Telehealth: Payer: Self-pay | Admitting: *Deleted

## 2009-05-14 ENCOUNTER — Telehealth (INDEPENDENT_AMBULATORY_CARE_PROVIDER_SITE_OTHER): Payer: Self-pay | Admitting: Internal Medicine

## 2009-05-17 ENCOUNTER — Telehealth: Payer: Self-pay | Admitting: Internal Medicine

## 2009-06-04 ENCOUNTER — Encounter: Payer: Self-pay | Admitting: Internal Medicine

## 2009-06-16 ENCOUNTER — Telehealth: Payer: Self-pay | Admitting: Internal Medicine

## 2009-07-31 ENCOUNTER — Ambulatory Visit: Payer: Self-pay | Admitting: Sports Medicine

## 2009-08-12 ENCOUNTER — Telehealth: Payer: Self-pay | Admitting: Internal Medicine

## 2009-08-28 ENCOUNTER — Ambulatory Visit (HOSPITAL_COMMUNITY): Admission: RE | Admit: 2009-08-28 | Discharge: 2009-08-28 | Payer: Self-pay | Admitting: Internal Medicine

## 2009-09-04 ENCOUNTER — Ambulatory Visit: Payer: Self-pay | Admitting: Internal Medicine

## 2009-09-04 LAB — CONVERTED CEMR LAB: Blood Glucose, Fingerstick: 120

## 2009-09-08 ENCOUNTER — Telehealth: Payer: Self-pay | Admitting: Internal Medicine

## 2009-09-09 ENCOUNTER — Telehealth: Payer: Self-pay | Admitting: Internal Medicine

## 2009-09-10 ENCOUNTER — Telehealth: Payer: Self-pay | Admitting: Internal Medicine

## 2009-09-16 ENCOUNTER — Ambulatory Visit: Payer: Self-pay | Admitting: Sports Medicine

## 2009-09-16 DIAGNOSIS — M67919 Unspecified disorder of synovium and tendon, unspecified shoulder: Secondary | ICD-10-CM | POA: Insufficient documentation

## 2009-09-16 DIAGNOSIS — M719 Bursopathy, unspecified: Secondary | ICD-10-CM

## 2009-09-25 ENCOUNTER — Telehealth: Payer: Self-pay | Admitting: *Deleted

## 2009-09-25 ENCOUNTER — Ambulatory Visit: Payer: Self-pay | Admitting: Internal Medicine

## 2009-10-14 ENCOUNTER — Ambulatory Visit: Payer: Self-pay | Admitting: Sports Medicine

## 2009-11-04 ENCOUNTER — Telehealth: Payer: Self-pay | Admitting: *Deleted

## 2010-01-05 ENCOUNTER — Telehealth: Payer: Self-pay | Admitting: Internal Medicine

## 2010-01-12 ENCOUNTER — Telehealth: Payer: Self-pay | Admitting: Internal Medicine

## 2010-01-14 ENCOUNTER — Telehealth: Payer: Self-pay | Admitting: Internal Medicine

## 2010-01-30 ENCOUNTER — Ambulatory Visit: Payer: Self-pay | Admitting: Internal Medicine

## 2010-01-30 ENCOUNTER — Ambulatory Visit (HOSPITAL_COMMUNITY): Admission: RE | Admit: 2010-01-30 | Discharge: 2010-01-30 | Payer: Self-pay | Admitting: Internal Medicine

## 2010-01-30 LAB — CONVERTED CEMR LAB
ALT: 30 units/L (ref 0–35)
Basophils Relative: 0 % (ref 0–1)
Blood Glucose, Fingerstick: 212
CO2: 25 meq/L (ref 19–32)
Cholesterol: 146 mg/dL (ref 0–200)
Hgb A1c MFr Bld: 6.4 %
LDL Cholesterol: 63 mg/dL (ref 0–99)
Lymphocytes Relative: 50 % — ABNORMAL HIGH (ref 12–46)
Lymphs Abs: 4.1 10*3/uL — ABNORMAL HIGH (ref 0.7–4.0)
MCHC: 32.7 g/dL (ref 30.0–36.0)
Monocytes Relative: 6 % (ref 3–12)
Neutro Abs: 3.4 10*3/uL (ref 1.7–7.7)
Neutrophils Relative %: 42 % — ABNORMAL LOW (ref 43–77)
Potassium: 4.2 meq/L (ref 3.5–5.3)
RBC: 4.22 M/uL (ref 3.87–5.11)
Sodium: 141 meq/L (ref 135–145)
Total Bilirubin: 0.5 mg/dL (ref 0.3–1.2)
Total Protein: 7.1 g/dL (ref 6.0–8.3)
VLDL: 32 mg/dL (ref 0–40)
WBC: 8.2 10*3/uL (ref 4.0–10.5)

## 2010-02-03 ENCOUNTER — Ambulatory Visit: Payer: Self-pay | Admitting: Internal Medicine

## 2010-02-03 LAB — CONVERTED CEMR LAB
OCCULT 2: NEGATIVE
OCCULT 3: POSITIVE

## 2010-02-09 ENCOUNTER — Ambulatory Visit: Payer: Self-pay | Admitting: Sports Medicine

## 2010-04-03 ENCOUNTER — Telehealth: Payer: Self-pay | Admitting: Internal Medicine

## 2010-05-13 ENCOUNTER — Telehealth: Payer: Self-pay | Admitting: Internal Medicine

## 2010-05-21 ENCOUNTER — Telehealth: Payer: Self-pay | Admitting: Internal Medicine

## 2010-06-05 ENCOUNTER — Telehealth: Payer: Self-pay | Admitting: Internal Medicine

## 2010-06-09 ENCOUNTER — Telehealth: Payer: Self-pay | Admitting: *Deleted

## 2010-06-10 ENCOUNTER — Ambulatory Visit: Payer: Self-pay | Admitting: Cardiovascular Disease

## 2010-06-10 ENCOUNTER — Encounter: Payer: Self-pay | Admitting: Ophthalmology

## 2010-06-10 ENCOUNTER — Inpatient Hospital Stay (HOSPITAL_COMMUNITY): Admission: EM | Admit: 2010-06-10 | Discharge: 2010-06-12 | Payer: Self-pay | Admitting: Emergency Medicine

## 2010-06-10 ENCOUNTER — Ambulatory Visit: Payer: Self-pay | Admitting: Internal Medicine

## 2010-06-10 ENCOUNTER — Telehealth: Payer: Self-pay | Admitting: *Deleted

## 2010-06-12 ENCOUNTER — Encounter: Payer: Self-pay | Admitting: Internal Medicine

## 2010-06-15 ENCOUNTER — Telehealth: Payer: Self-pay | Admitting: Internal Medicine

## 2010-06-19 ENCOUNTER — Ambulatory Visit: Payer: Self-pay | Admitting: Internal Medicine

## 2010-06-19 ENCOUNTER — Encounter (INDEPENDENT_AMBULATORY_CARE_PROVIDER_SITE_OTHER): Payer: Self-pay | Admitting: *Deleted

## 2010-06-19 DIAGNOSIS — N3000 Acute cystitis without hematuria: Secondary | ICD-10-CM | POA: Insufficient documentation

## 2010-06-19 DIAGNOSIS — R55 Syncope and collapse: Secondary | ICD-10-CM | POA: Insufficient documentation

## 2010-06-24 ENCOUNTER — Telehealth: Payer: Self-pay | Admitting: Internal Medicine

## 2010-07-07 ENCOUNTER — Telehealth: Payer: Self-pay | Admitting: Internal Medicine

## 2010-07-11 ENCOUNTER — Ambulatory Visit (HOSPITAL_COMMUNITY): Admission: RE | Admit: 2010-07-11 | Discharge: 2010-07-11 | Payer: Self-pay | Admitting: Internal Medicine

## 2010-07-20 ENCOUNTER — Encounter (INDEPENDENT_AMBULATORY_CARE_PROVIDER_SITE_OTHER): Payer: Self-pay | Admitting: *Deleted

## 2010-07-21 ENCOUNTER — Ambulatory Visit: Payer: Self-pay | Admitting: Internal Medicine

## 2010-07-21 ENCOUNTER — Telehealth: Payer: Self-pay | Admitting: Internal Medicine

## 2010-07-21 LAB — CONVERTED CEMR LAB: Blood Glucose, Fingerstick: 43

## 2010-07-23 ENCOUNTER — Ambulatory Visit: Payer: Self-pay | Admitting: Gastroenterology

## 2010-07-27 ENCOUNTER — Encounter: Payer: Self-pay | Admitting: Internal Medicine

## 2010-08-03 ENCOUNTER — Ambulatory Visit: Payer: Self-pay | Admitting: Internal Medicine

## 2010-08-06 ENCOUNTER — Ambulatory Visit: Payer: Self-pay | Admitting: Gastroenterology

## 2010-08-06 DIAGNOSIS — D126 Benign neoplasm of colon, unspecified: Secondary | ICD-10-CM

## 2010-08-06 HISTORY — DX: Benign neoplasm of colon, unspecified: D12.6

## 2010-08-07 ENCOUNTER — Encounter: Payer: Self-pay | Admitting: Gastroenterology

## 2010-08-11 ENCOUNTER — Ambulatory Visit: Payer: Self-pay | Admitting: Sports Medicine

## 2010-08-11 ENCOUNTER — Encounter: Payer: Self-pay | Admitting: Internal Medicine

## 2010-08-12 ENCOUNTER — Telehealth: Payer: Self-pay | Admitting: Internal Medicine

## 2010-08-31 ENCOUNTER — Ambulatory Visit (HOSPITAL_COMMUNITY): Admission: RE | Admit: 2010-08-31 | Discharge: 2010-08-31 | Payer: Self-pay | Admitting: Internal Medicine

## 2010-09-10 ENCOUNTER — Telehealth: Payer: Self-pay | Admitting: *Deleted

## 2010-09-14 ENCOUNTER — Ambulatory Visit: Payer: Self-pay | Admitting: Internal Medicine

## 2010-09-14 LAB — CONVERTED CEMR LAB: Hgb A1c MFr Bld: 6.3 %

## 2010-09-25 ENCOUNTER — Telehealth: Payer: Self-pay | Admitting: Internal Medicine

## 2010-09-28 ENCOUNTER — Ambulatory Visit: Payer: Self-pay | Admitting: Sports Medicine

## 2010-10-12 ENCOUNTER — Ambulatory Visit: Payer: Self-pay | Admitting: Family Medicine

## 2010-10-13 ENCOUNTER — Telehealth: Payer: Self-pay | Admitting: Internal Medicine

## 2010-10-14 ENCOUNTER — Encounter: Payer: Self-pay | Admitting: Sports Medicine

## 2010-10-20 ENCOUNTER — Telehealth (INDEPENDENT_AMBULATORY_CARE_PROVIDER_SITE_OTHER): Payer: Self-pay | Admitting: *Deleted

## 2010-10-22 ENCOUNTER — Ambulatory Visit: Payer: Self-pay | Admitting: Sports Medicine

## 2010-11-02 ENCOUNTER — Ambulatory Visit: Payer: Self-pay | Admitting: Family Medicine

## 2010-11-03 ENCOUNTER — Telehealth: Payer: Self-pay | Admitting: Internal Medicine

## 2010-12-27 ENCOUNTER — Encounter: Payer: Self-pay | Admitting: Gastroenterology

## 2011-01-05 NOTE — Initial Assessments (Signed)
INTERNAL MEDICINE ADMISSION HISTORY AND PHYSICAL  Attending: Dr. Rogelia Boga First Contact: Dr. Anselm Jungling [phone] Second Contact: Dr. Threasa Beards [phone] PCP: Mliss Sax MD  CC: Syncope  HPI: This is a 50 y/o AA female with a past medical history significant for PSVT,  type 2 diabetes, hypertension, and depression, who presents with a two day history of syncopal events.  The patient endorses three such events to date.  The first occurred yesterday at 1pm while the patient was sitting at a doctors office.  After getting up she noted herself walking "side ways" which apparently is not uncommon for her and has been occurring intermittently for the last 3-4 years.  At that point the patient passed out and was unconscious for a few mins.  The patient passed out again around 3pm while she was walking down her steps.  The last episode occurred today when she was walking to catch a bus.  On each of the occasions, the patient passed out with no prodromal symptoms (including warmth, palpitations, dizziness), and awakened without a post ictal state a within a few minutes. The patient does not recall the events and is unsure of whether or not she hit her head.  Two of the three of these events were reportedly witnessed and bystanders informed her that she had no shaking or other signs of seizures.  The patient denies having bit her tongue nor having lost control of her bowel or bladder.   There are no apparent associated symptoms nor any particular triggers.  The pt denies feeling anxious prior to the events and also denies any chest pain, weakness, SOB, recent changes in her medications, blurred vision, slurred speech, or diaphoresis.  The patient does claim that she used to experience similar episodes for which she never saw a doctor between the ages of 66 and 49.  Also of note, according to Dr. Gracy Racer clinic note  6/09, pt had previously been hospitalized at [facility] for PSVT leading to hypotension.   Upon further  questioning, pt endorses a 4 month history of constant sharp 10/10 shoulder pain that is worsened by movement and relieved by nothing.   ALLERGIES: NKDA  PAST MEDICAL HISTORY: PSVT associated with hypotension in 2008 Fibromyalgia Depression Diabetes mellitus, type II- Diagnosed 5 years ago Hypertension Obesity Tobacco abuse Low back pain Cocaine use and etoh- sober since 2001 Hepatitis C, Diagnosed 3-4 years ago, untreated. Insomnia  MEDICATIONS: Current Meds:  GLUCOTROL XL 10 MG XR24H-TAB (GLIPIZIDE) Take 1 tablet by mouth once a day METFORMIN HCL 1000 MG TABS (METFORMIN HCL) Take 1 tablet by mouth two times a day CALCIUM 600 1500 MG TABS (CALCIUM CARBONATE) Take 1 tablet by mouth two times a day VITAMIN D 400 UNIT CAPS (CHOLECALCIFEROL) Take 1 tablet by mouth once a day TRUETRACK TEST  STRP (GLUCOSE BLOOD) to test blood sugar 2x/day CYMBALTA 60 MG CPEP (DULOXETINE HCL) Take 2 tablets by mouth once a day LIDODERM 5 %  PTCH (LIDOCAINE) Apply on affected area once a day for 12 hours only, then off for 12 hours. METOPROLOL TARTRATE 25 MG  TABS (METOPROLOL TARTRATE) Take 1 tablet by mouth two times a day BENICAR 20 MG TABS (OLMESARTAN MEDOXOMIL) Take 1 tablet by mouth once a day BACTROBAN 2 % OINT (MUPIROCIN) apply three times a day NEURONTIN 300 MG CAPS (GABAPENTIN) Take 2 caps three times a day ULTICARE INSULIN SYRINGE 30G X 5/16" 0.5 ML MISC (INSULIN SYRINGE-NEEDLE U-100) use 2-3 times per day when you are checking your sugar  level ULTICARE SHORT PEN NEEDLES 31G X 8 MM MISC (INSULIN PEN NEEDLE) use 2-3 times per day when you are checking your sugar level PRILOSEC OTC 20 MG TBEC (OMEPRAZOLE MAGNESIUM) Take 1 tablet by mouth once a day TRAZODONE HCL 100 MG TABS (TRAZODONE HCL) Take 1 tablet by mouth at bedtime WELBUTRIN dose unknown, by mouth BID TRAMADOL HCL 50 MG TABS (TRAMADOL HCL) 1 tab every 6 hrs as needed for pain VICODIN 5-500 MG TABS (HYDROCODONE-ACETAMINOPHEN) Twice a day  as needed for pain  SOCIAL HISTORY: Social History: currently unemployed, previously worked as a Lawyer, lives by herself. Has two sons, one of which recently passed away.   FAMILY HISTORY Family History: 1 son with schizophrenia is 73 son in prison is 22 - for drug dealing lost twins through miscarriage after physical abuse Son who died of heart attack at age 64.  GM - heart problems Mother died of uterine ca Father died in mva  ROS: Negative as per HPI.   VITALS: T: 97.9 P:73  BP:127/88  R: 20  O2SAT: 100%  ON:RA PHYSICAL EXAM: General:  alert, well-developed, and cooperative to examination.   Head:  normocephalic and atraumatic.   Eyes:  vision grossly intact, pupils equal, pupils round, pupils reactive to light, no injection and anicteric.   Mouth:  pharynx pink and moist, no erythema, and no exudates.   Neck:  supple, full ROM, no thyromegaly, no JVD, and no carotid bruits.   Lungs:  normal respiratory effort, no accessory muscle use, normal breath sounds, no crackles, and no wheezes.  Heart:  normal rate, regular rhythm, no murmur, no gallop, and no rub.   Abdomen:  soft, non-tender, normal bowel sounds, no distention, no guarding, no rebound tenderness, no hepatomegaly, and no splenomegaly.   Msk:  no joint swelling, no joint warmth, and no redness over joints.  Left shoulder is tender to palpation and passive ROM without noticable joint effusion or warmth.  Pulses:  2+ DP/PT pulses bilaterally Extremities:  No cyanosis, clubbing, edema  Neurologic:  alert & oriented X3, cranial nerves II-XII intact, strength normal in all extremities, sensation intact to light touch, and gait normal.   Skin:  turgor normal and no rashes.   Psych:  Oriented X3, memory intact for recent and remote, normally interactive, good eye contact, not anxious appearing, and not depressed  appearing.  LABS:  WBC                                      8.6               4.0-10.5         K/uL  RBC                                       4.00              3.87-5.11        MIL/uL  Hemoglobin (HGB)                         12.4              12.0-15.0        g/dL  Hematocrit (HCT)  37.4              36.0-46.0        %  MCV                                      93.4              78.0-100.0       fL  MCH -                                    31.0              26.0-34.0        pg  MCHC                                     33.2              30.0-36.0        g/dL  RDW                                      13.9              11.5-15.5        %  Platelet Count (PLT)                     189               150-400          K/uL  Neutrophils, %                           42         l      43-77            %  Lymphocytes, %                           48         h      12-46            %  Monocytes, %                             8                 3-12             %  Eosinophils, %                           2                 0-5              %  Basophils, %                             1  0-1              %  Neutrophils, Absolute                    3.6               1.7-7.7          K/uL  Lymphocytes, Absolute                    4.1        h      0.7-4.0          K/uL  Monocytes, Absolute                      0.7               0.1-1.0          K/uL  Eosinophils, Absolute                    0.1               0.0-0.7          K/uL  Basophils, Absolute                      0.0               0.0-0.1          K/uL   CKMB, POC                                <1.0       l      1.0-8.0          ng/mL  Troponin I, POC                          <0.05             0.00-0.09        ng/mL  Myoglobin, POC                           42.6              12-200           ng/mL   Sodium (NA)                              137               135-145          mEq/L  Potassium (K)                            4.3               3.5-5.1          mEq/L  Chloride                                 103               96-112            mEq/L  CO2                                      27                19-32            mEq/L  Glucose                                  80                70-99            mg/dL  BUN                                      15                6-23             mg/dL  Creatinine                               1.13              0.4-1.2          mg/dL  GFR, Est Non African American            51         l      >60              mL/min  GFR, Est African American                >60               >60              mL/min    Oversized comment, see footnote  1  Calcium                                  8.9               8.4-10.5         mg/dL  STUDIES:   Head CT without contrast: Findings: No evidence for acute hemorrhage, mass lesion, midline   shift, hydrocephalus or large infarct.  No acute bony abnormality.   The nasal septum is deviated towards the right side.  Chest Xray: IMPRESSION:   No acute finding.  EKG: Normal sinus rhythm with a rate of 96 bpm.  ASSESSMENT AND PLAN:  1. Syncope: DDX includes Cardiac causes (arrhythmia, ischemia, structural), absence seizure, medication effect (Neurontin, Metoprolol, insulin, cymbalta), orthostasis, vasovagal syncope,  psychiatric syncope, hypoglycemia, drug use.  Given past history of PTST associated with hypotension in 2016m, arrhythmia is being heavily considered. Unfortunately, further details of prior admission to [facility]  are unavailable at this time. No formal cardiology evaluation was done as far as were are aware after that hospitalization.     Plan:     -admit to telemetry unit to monitor for arrhythmia.     -EKG when pt arrives to floor.       -UDS given hx of cocaine use, will  evaluate to determine if drug           use could be contributing to etiology.     -2D echocardiogram       -hold parameters for hypertension medications and orthostasis.      -100 cc NS x 12 hours.     - will continue to monitor CBC and BMET     - will check TSH to  evaluate for hyperthyroidism which could          precipitate arrhythmia.      -will check carotid doplers.   2. Shoulder pain:  Pt has history of fibromyalgia.  Pain has been ongoing for the last 4 months will evaluate with shoulder x-ray and recommend outpatient work up if negative.   2. Type 2 diabetes: Last A1C 6.4 on 2/11.  Will get HGB A1C and continue home medications.   3. Hepatitis C: reportedly untreated will order hepatitis C viral load.    3. Chronic pain: continue home medications.   5. DEPRESSION/ANXIETY: Continue home medications  6. VTE PROPH: lovenox  7. FEN: 100 cc NS x 12 hours, continue to monitor electrolytes and replete if needed, regular diet.   Attending Physician: I have seen and examined the patient. I reviewed the resident/fellow note and agree with the findings and plan of care as documented. My additions and revisions are included.    Name:   Date:

## 2011-01-05 NOTE — Progress Notes (Signed)
Summary: refill/ hla  Phone Note Refill Request Message from:  Fax from Pharmacy on November 03, 2010 3:03 PM  Refills Requested: Medication #1:  BACTROBAN 2 % OINT apply three times a day   Dosage confirmed as above?Dosage Confirmed   Last Refilled: 10/14 last visit 11/28  Initial call taken by: Marin Roberts RN,  November 03, 2010 3:03 PM  Follow-up for Phone Call        She has been on this since 2009. Any reason why she needs chronic topical ABX? I could not find a reason why. It just kept being refilled and refilled. Follow-up by: Blanch Media MD,  November 03, 2010 3:59 PM    Prescriptions: BACTROBAN 2 % OINT (MUPIROCIN) apply three times a day  #1 x 0   Entered and Authorized by:   Julaine Fusi  DO   Signed by:   Julaine Fusi  DO on 11/03/2010   Method used:   Faxed to ...       [facility] [address] (retail)       [address]       [facility]       [place], [place]  [address]       Ph: [phone]       Fax: [phone]   RxID:   [id number]   Appended Document: refill/ hla i have tried to call pt 3 times, keep getting a message about [facility] is off
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
## 2011-01-05 NOTE — Progress Notes (Signed)
Summary: Refill/gh  Phone Note Refill Request Message from:  Fax from Pharmacy on September 25, 2010 1:57 PM  Refills Requested: Medication #1:  PRILOSEC OTC 20 MG TBEC Take 1 tablet by mouth once a day   Last Refilled: 07/20/2010  Medication #2:  BENICAR 20 MG TABS Take 1 tablet by mouth once a day   Last Refilled: 08/11/2010  Medication #3:  TRAMADOL HCL 50 MG TABS 1 tab every 6 hrs as needed for pain  Method Requested: Electronic Initial call taken by: Angelina Ok RN,  September 25, 2010 1:58 PM  Follow-up for Phone Call        completed refill, thank you [name]   Follow-up by: Mliss Sax MD,  September 25, 2010 2:59 PM    Prescriptions: TRAMADOL HCL 50 MG TABS (TRAMADOL HCL) 1 tab every 6 hrs as needed for pain  #120 x 7   Entered and Authorized by:   Mliss Sax MD   Signed by:   Mliss Sax MD on 09/25/2010   Method used:   Faxed to ...       [facility] [address] (retail)       [address]       [facility]       [place], [place]  [address]       Ph: [phone]       Fax: [phone]   RxID:   [id number] PRILOSEC OTC 20 MG TBEC (OMEPRAZOLE MAGNESIUM) Take 1 tablet by mouth once a day  #30 x 6   Entered and Authorized by:   Mliss Sax MD   Signed by:   Mliss Sax MD on 09/25/2010   Method used:   Faxed to ...       [facility] [address] (retail)       [address]       [facility]       [name], [place]  [address]       Ph: [phone]       Fax: [phone]   RxID:   [id number] BENICAR 20 MG TABS (OLMESARTAN MEDOXOMIL) Take 1 tablet by mouth once a day  #31 x 7   Entered and Authorized by:   Mliss Sax MD   Signed by:   Mliss Sax MD on 09/25/2010   Method used:   Faxed to ...       [facility] [address] (retail)       [address]       [facility]       [place], [place]  [address]       Ph: [phone]       Fax: [phone]   RxID:   [id number]

## 2011-01-05 NOTE — Progress Notes (Signed)
Summary: refill/ hla  Phone Note Refill Request Message from:  Patient on June 05, 2010 12:02 PM  Refills Requested: Medication #1:  ULTICARE INSULIN SYRINGE 30G X 5/16" 0.5 ML MISC use 2-3 times per day when you are checking your sugar level   Dosage confirmed as above?Dosage Confirmed Initial call taken by: Marin Roberts RN,  June 05, 2010 12:02 PM  Follow-up for Phone Call        completed refill, thank you Terran Hollenkamp  Follow-up by: Mliss Sax MD,  June 05, 2010 6:41 PM    Prescriptions: Stann Ore INSULIN SYRINGE 30G X 5/16" 0.5 ML MISC (INSULIN SYRINGE-NEEDLE U-100) use 2-3 times per day when you are checking your sugar level  #90 x 10   Entered and Authorized by:   Mliss Sax MD   Signed by:   Mliss Sax MD on 06/05/2010   Method used:   Electronically to        [facility] # [phone]* (retail)       [address]       [place], [place]  [address]       Ph: [phone]       Fax: [phone]   RxID:   [id number]

## 2011-01-05 NOTE — Progress Notes (Signed)
Summary: phone/gg  Phone Note Call from Patient   Caller: Patient Summary of Call: Pt called stating she blacked out yesterday and again today.  She states she was walking sideways then she blacked out.  She was able to get up with help and return to normal acitvity.  This happened again today.  She did say this used to happen alot when she was young.  She denies other problems.  No nausea,  no known cause. she said she will not be able to come in for an appointment for at least a week.  At this time the phone call was disconnected and I was not able to advise pt.   Initial call taken by: Merrie Roof RN,  June 10, 2010 2:18 PM  Follow-up for Phone Call        Pt returned call and stated she blacked out again. I advised pt to go to ED NOW for evaluation.  She will have someone bring her. Follow-up by: Merrie Roof RN,  June 10, 2010 2:24 PM  Additional Follow-up for Phone Call Additional follow up Details #1::        I agree that patient should be seen in ED now.  She should not drive, and should call EMS if she is not already being taken to the ED. Additional Follow-up by: Margarito Liner MD,  June 10, 2010 3:00 PM

## 2011-01-05 NOTE — Miscellaneous (Signed)
Summary: [facility] Previsit/prep  Clinical Lists Changes  Medications: Added new medication of COLYTE WITH FLAVOR PACKS 240 GM  SOLR (PEG 3350-KCL-NABCB-NACL-NASULF) As per prep instructions. - Signed Rx of COLYTE WITH FLAVOR PACKS 240 GM  SOLR (PEG 3350-KCL-NABCB-NACL-NASULF) As per prep instructions.;  #1 x 0;  Signed;  Entered by: Wyona Almas RN;  Authorized by: Meryl Dare MD [facility];  Method used: Print then Give to Patient Observations: Added new observation of NKA: T (07/23/2010 9:06)    Prescriptions: COLYTE WITH FLAVOR PACKS 240 GM  SOLR (PEG 3350-KCL-NABCB-NACL-NASULF) As per prep instructions.  #1 x 0   Entered by:   Wyona Almas RN   Authorized by:   Meryl Dare MD [facility]   Signed by:   Wyona Almas RN on 07/23/2010   Method used:   Print then Give to Patient   RxID:   [id number]

## 2011-01-05 NOTE — Progress Notes (Signed)
Summary: med refill/gp  Phone Note Refill Request Message from:  Patient on October 13, 2010 12:04 PM  Refills Requested: Medication #1:  VICODIN 5-500 MG TABS Twice a day as needed for pain Pt. received #20 tablet from Dr. Jennette Kettle on 09/28/10. Pt. was called and stated she has taken the 20 tabs; she has been taking 3 a day.  Stated Dr. Jennette Kettle said she can take 3 a day for the broken bone in her foot.   Method Requested: Telephone to Pharmacy Initial call taken by: Chinita Pester RN,  October 13, 2010 12:04 PM  Follow-up for Phone Call        I have last perscribed this medication in June and I saw that she has gotten some in october so I assume that she is still in pain, what I dont see is the pain contract so i think the best thing to do would be to get her to come in to sign a new pain contract since  it sounds that she might be on vicodin for some time. I would give pt 40 tablets and have her come in for further evaluation of her pain. Thank you  [name] Follow-up by: Mliss Sax MD,  October 14, 2010 9:11 AM    Prescriptions: VICODIN 5-500 MG TABS (HYDROCODONE-ACETAMINOPHEN) Twice a day as needed for pain  #40 x 5   Entered and Authorized by:   Mliss Sax MD   Signed by:   Mliss Sax MD on 10/14/2010   Method used:   Telephoned to ...       [facility]* (retail)       [address]       [address] Shipping/mailing       [place], [place]  [address]       Ph: [phone]       Fax: [phone]   RxID:   [id number]   Appended Document: med refill/gp Rx called to by MD; pt. was called and made awared of refill.

## 2011-01-05 NOTE — Progress Notes (Signed)
Summary: refill/gg  Phone Note Refill Request  on May 13, 2010 10:21 AM  Refills Requested: Medication #1:  TRAMADOL HCL 50 MG TABS 1 tab every 6 hrs as needed for pain ***Pt wants a 3 month supply because the cost is only $5   Method Requested: Fax to Local Pharmacy Initial call taken by: Merrie Roof RN,  May 13, 2010 10:21 AM  Follow-up for Phone Call        completed refill, thank you Elio Haden  Follow-up by: Mliss Sax MD,  May 13, 2010 2:01 PM    Prescriptions: TRAMADOL HCL 50 MG TABS (TRAMADOL HCL) 1 tab every 6 hrs as needed for pain  #120 x 3   Entered and Authorized by:   Mliss Sax MD   Signed by:   Mliss Sax MD on 05/13/2010   Method used:   Faxed to ...       [facility] [address] (retail)       [address]       [facility]       [place], [place]  [address]       Ph: [phone]       Fax: [phone]   RxID:   [id number] TRAMADOL HCL 50 MG TABS (TRAMADOL HCL) 1 tab every 6 hrs as needed for pain  #120 x 3   Entered and Authorized by:   Mliss Sax MD   Signed by:   Mliss Sax MD on 05/13/2010   Method used:   Telephoned to ...       [facility] [address] (retail)       [address]       [facility]       [place], [place]  [address]       Ph: [phone]       Fax: [phone]   RxID:   [id number]

## 2011-01-05 NOTE — Progress Notes (Signed)
Summary: phone/gg  Phone Note Call from Patient   Caller: Patient Summary of Call: Pt states she fell and chipped bone in foot about 2 weeks ago.    She was seen at [facility],  was given a shoe and pain meds. She was told to f/u with PCP. Scheduled for Monday  Initial call taken by: Merrie Roof RN,  September 10, 2010 5:40 PM

## 2011-01-05 NOTE — Progress Notes (Signed)
Summary: refill/gg  Phone Note Refill Request  on April 03, 2010 2:20 PM  Refills Requested: Medication #1:  TRAMADOL HCL 50 MG TABS 1 tab every 6 hrs as needed for pain   Last Refilled: 01/30/2010  Method Requested: Telephone to Pharmacy Initial call taken by: Merrie Roof RN,  April 03, 2010 2:20 PM  Follow-up for Phone Call        completed refill, thank you Rosamund Nyland  Follow-up by: Mliss Sax MD,  April 03, 2010 3:06 PM    Prescriptions: TRAMADOL HCL 50 MG TABS (TRAMADOL HCL) 1 tab every 6 hrs as needed for pain  #120 x 0   Entered and Authorized by:   Mliss Sax MD   Signed by:   Mliss Sax MD on 04/03/2010   Method used:   Telephoned to ...       [facility] [address] (retail)       [address]       [facility]       [place], [place]  [address]       Ph: [phone]       Fax: [phone]   RxID:   [id number]   Appended Document: refill/gg Rx called in

## 2011-01-05 NOTE — Progress Notes (Signed)
Summary: refill/gg  Phone Note Refill Request  on July 07, 2010 3:13 PM  Refills Requested: Medication #1:  BENICAR 20 MG TABS Take 1 tablet by mouth once a day   Last Refilled: 01/14/2010 # 90   Method Requested: Fax to Local Pharmacy Initial call taken by: Merrie Roof RN,  July 07, 2010 3:14 PM  Follow-up for Phone Call        completed refill, thank you [name]  Follow-up by: Mliss Sax MD,  July 07, 2010 3:32 PM    Prescriptions: BENICAR 20 MG TABS (OLMESARTAN MEDOXOMIL) Take 1 tablet by mouth once a day  #31 x 7   Entered and Authorized by:   Mliss Sax MD   Signed by:   Mliss Sax MD on 07/07/2010   Method used:   Telephoned to ...       [facility] [address] (retail)       [address]       [facility]       [place], [place]  [address]       Ph: [phone]       Fax: [phone]   RxID:   [id number]   Appended Document: refill/gg rax sent in

## 2011-01-05 NOTE — Assessment & Plan Note (Signed)
Summary: F/U FOOT,MC   Primary Care [facility]:  Mliss Sax MD   History of Present Illness: f/u RIGHT foot and ankle. Better. Has been doing exercises. Still occasionally has some pain.   B plantar fasciitis pain--saw Dr Darrick Penna last week and had heel cups placed in her shoes and started using arch strap. Left plantar fascia area some better but right is still painful. They had discussed injection and she wants to try that.  Current Medications (verified): 1)  Glucotrol Xl 10 Mg Xr24h-Tab (Glipizide) .... Take 1 Tablet By Mouth Once A Day 2)  Metformin Hcl 1000 Mg Tabs (Metformin Hcl) .... Take 1 Tablet By Mouth Two Times A Day 3)  Calcium 600 1500 Mg Tabs (Calcium Carbonate) .... Take 1 Tablet By Mouth Two Times A Day 4)  Vitamin D 400 Unit Caps (Cholecalciferol) .... Take 1 Tablet By Mouth Once A Day 5)  Truetrack Test  Strp (Glucose Blood) .... To Test Blood Sugar 2x/day 6)  Cymbalta 60 Mg Cpep (Duloxetine Hcl) .... Take 2 Tablets By Mouth Once A Day 7)  Metoprolol Tartrate 25 Mg  Tabs (Metoprolol Tartrate) .... Take 1/2  Tablet By Mouth Two Times A Day 8)  Benicar 20 Mg Tabs (Olmesartan Medoxomil) .... Take 1 Tablet By Mouth Once A Day 9)  Bactroban 2 % Oint (Mupirocin) .... Apply Three Times A Day 10)  Neurontin 300 Mg Caps (Gabapentin) .... Take 2 Caps Three Times A Day 11)  Ulticare Insulin Syringe 30g X 5/16" 0.5 Ml Misc (Insulin Syringe-Needle U-100) .... Use 2-3 Times Per Day When You Are Checking Your Sugar Level 12)  Ulticare Short Pen Needles 31g X 8 Mm Misc (Insulin Pen Needle) .... Use 2-3 Times Per Day When You Are Checking Your Sugar Level 13)  Prilosec Otc 20 Mg Tbec (Omeprazole Magnesium) .... Take 1 Tablet By Mouth Once A Day 14)  Trazodone Hcl 100 Mg Tabs (Trazodone Hcl) .... Take 1 Tablet By Mouth At Bedtime 15)  Abilify 5 Mg Tabs (Aripiprazole) .... Take 1 Tablet By Mouth Once A Day 16)  Tramadol Hcl 50 Mg Tabs (Tramadol Hcl) .Marland Kitchen.. 1 Tab Every 6 Hrs As Needed For  Pain 17)  Vicodin 5-500 Mg Tabs (Hydrocodone-Acetaminophen) .... Twice A Day As Needed For Pain 18)  Ciprofloxacin Hcl 500 Mg Tabs (Ciprofloxacin Hcl) .... Take 1 Tablet By Mouth Two Times A Day 19)  Zoloft 25 Mg Tabs (Sertraline Hcl) .... Take 1 Tablet By Mouth Once A Day  Allergies: No Known Drug Allergies  Review of Systems  The patient denies weight gain.    Physical Exam  General:  alert, well-developed, well-nourished, well-hydrated, and overweight-appearing.   Msk:  Right plantar fascia origin is TTP and this reproduces her pain. No erythema or warmth, normal skin without a lot of callous. Neurovascaulrly intact.  RIGHT ankle still some ttp over ATF area but essentially full ROM and strength in ankle and fooot Additional Exam:  Patient given informed consent for injection. Discussed possible complications of infection, bleeding or skin atrophy at site of injection. Possible side effect of avascular necrosis (focal area of bone death) due to steroid use.Appropriate verbal time out taken Are cleaned and prepped in usual sterile fashion. A --1-- cc kennalog plus ---1-cc 1% lidocaine without epinephrine was injected into the-area above the right plantar fascia origin using a lateral approach--. Patient tolerated procedure well with no complications.    Impression & Recommendations:  Problem # 1:  ANKLE PAIN (ICD-719.47) LEFT ankle improved;  back to baseline  Problem # 2:  PLANTAR FASCIITIS, BILATERAL (ICD-728.71) injection into right foot continue arch strap and heel cups, stretches rtc if not improveing  Complete Medication List: 1)  Glucotrol Xl 10 Mg Xr24h-tab (Glipizide) .... Take 1 tablet by mouth once a day 2)  Metformin Hcl 1000 Mg Tabs (Metformin hcl) .... Take 1 tablet by mouth two times a day 3)  Calcium 600 1500 Mg Tabs (Calcium carbonate) .... Take 1 tablet by mouth two times a day 4)  Vitamin D 400 Unit Caps (Cholecalciferol) .... Take 1 tablet by mouth once a  day 5)  Truetrack Test Strp (Glucose blood) .... To test blood sugar 2x/day 6)  Cymbalta 60 Mg Cpep (Duloxetine hcl) .... Take 2 tablets by mouth once a day 7)  Metoprolol Tartrate 25 Mg Tabs (Metoprolol tartrate) .... Take 1/2  tablet by mouth two times a day 8)  Benicar 20 Mg Tabs (Olmesartan medoxomil) .... Take 1 tablet by mouth once a day 9)  Bactroban 2 % Oint (Mupirocin) .... Apply three times a day 10)  Neurontin 300 Mg Caps (Gabapentin) .... Take 2 caps three times a day 11)  Ulticare Insulin Syringe 30g X 5/16" 0.5 Ml Misc (Insulin syringe-needle u-100) .... Use 2-3 times per day when you are checking your sugar level 12)  Ulticare Short Pen Needles 31g X 8 Mm Misc (Insulin pen needle) .... Use 2-3 times per day when you are checking your sugar level 13)  Prilosec Otc 20 Mg Tbec (Omeprazole magnesium) .... Take 1 tablet by mouth once a day 14)  Trazodone Hcl 100 Mg Tabs (Trazodone hcl) .... Take 1 tablet by mouth at bedtime 15)  Abilify 5 Mg Tabs (Aripiprazole) .... Take 1 tablet by mouth once a day 16)  Tramadol Hcl 50 Mg Tabs (Tramadol hcl) .Marland Kitchen.. 1 tab every 6 hrs as needed for pain 17)  Vicodin 5-500 Mg Tabs (Hydrocodone-acetaminophen) .... Twice a day as needed for pain 18)  Ciprofloxacin Hcl 500 Mg Tabs (Ciprofloxacin hcl) .... Take 1 tablet by mouth two times a day 19)  Zoloft 25 Mg Tabs (Sertraline hcl) .... Take 1 tablet by mouth once a day  Other Orders: Joint Aspirate / Injection, Intermediate (09811) Kenalog 10 mg inj (B1478)   Orders Added: 1)  Est. Patient Level III [29562] 2)  Joint Aspirate / Injection, Intermediate [20605] 3)  Kenalog 10 mg inj [J3301]

## 2011-01-05 NOTE — Progress Notes (Signed)
Summary: refill/gg  Phone Note Refill Request  on May 21, 2010 3:02 PM  Refills Requested: Medication #1:  VICODIN 5-500 MG TABS Twice a day as needed for pain.   Last Refilled: 01/30/2010 Pt states tramadol does not help her pain, only gives some relief.   Method Requested: Fax to Local Pharmacy Initial call taken by: Merrie Roof RN,  May 21, 2010 3:03 PM  Follow-up for Phone Call        completed refill, thank you Billiejean Schimek  Follow-up by: Mliss Sax MD,  May 21, 2010 11:38 PM    Prescriptions: VICODIN 5-500 MG TABS (HYDROCODONE-ACETAMINOPHEN) Twice a day as needed for pain  #20 x 0   Entered and Authorized by:   Mliss Sax MD   Signed by:   Mliss Sax MD on 05/21/2010   Method used:   Telephoned to ...       [facility] [address] (retail)       [address]       [facility]       [place], [place]  [address]       Ph: [phone]       Fax: [phone]   RxID:   [id number]   Appended Document: refill/gg Health dept no longer can supply narcotics.  This will need to be called into another pharmacy.  Unable to reach pt to tell her but message left for her to call us with that info.  Appended Document: refill/gg called to [facility] [address] [place]  Appended Document: refill/gg I was called by one of the pharmacist that explained they don't stock this medicine and pt can not get it from there any longer. I have tried to reach the patient at home to inform her of that but the number we have listed above has been disconnected. If she needs this refill she will have to get it from another pharmacy which I can call in for her I just need to know what other pharmacy is she using. Will wait for pts call.  Alvy Alsop  Appended Document: refill/gg I have this phone #  [phone] for Thressa  gayle  Appended Document: refill/gg I have tried that number and the voice mail picked up but it was female voice so will see if she calls back. I did  leave the message indicating to call us back.

## 2011-01-05 NOTE — Progress Notes (Signed)
Summary: refill/ hla  Phone Note Refill Request Message from:  Patient on January 05, 2010 4:57 PM  Refills Requested: Medication #1:  TRAMADOL HCL 50 MG TABS take one by mouth two times a day as needed knee pain.   Dosage confirmed as above?Dosage Confirmed   Supply Requested: 3 months   Last Refilled: 12/13 Initial call taken by: Marin Roberts RN,  January 05, 2010 4:57 PM    Prescriptions: TRAMADOL HCL 50 MG TABS (TRAMADOL HCL) take one by mouth two times a day as needed knee pain  #60 x 2   Entered and Authorized by:   Doneen Poisson MD   Signed by:   Doneen Poisson MD on 01/05/2010   Method used:   Faxed to ...       [facility] [address] (retail)       [address]       [facility]       [place], [place]  [address]       Ph: [phone]       Fax: [phone]   RxID:   [id number]

## 2011-01-05 NOTE — Progress Notes (Signed)
Summary: refill/gg  Phone Note Refill Request  on July 21, 2010 4:36 PM  Refills Requested: Medication #1:  METOPROLOL TARTRATE 25 MG  TABS Take 1/2  tablet by mouth two times a day   Last Refilled: 06/01/2010 refill request is for metoprolol 25 mg 1 tab two times a day    Method Requested: Fax to Local Pharmacy Initial call taken by: Merrie Roof RN,  July 21, 2010 4:37 PM  Follow-up for Phone Call        completed refill, thank you [name]  Follow-up by: Mliss Sax MD,  July 21, 2010 4:42 PM    Prescriptions: METOPROLOL TARTRATE 25 MG  TABS (METOPROLOL TARTRATE) Take 1/2  tablet by mouth two times a day  #30 x 5   Entered and Authorized by:   Mliss Sax MD   Signed by:   Mliss Sax MD on 07/21/2010   Method used:   Faxed to ...       [facility] [address] (retail)       [address]       [facility]       [place], [place]  [address]       Ph: [phone]       Fax: [phone]   RxID:   [id number]

## 2011-01-05 NOTE — Letter (Signed)
Summary: GLUCOSE LOGBOOK REPORT  GLUCOSE LOGBOOK REPORT   Imported By: Shon Hough 08/11/2010 16:10:17  _____________________________________________________________________  External Attachment:    Type:   Image     Comment:   External Document

## 2011-01-05 NOTE — Assessment & Plan Note (Signed)
Summary: 3:30APPT,L FOOT PAIN/CHIPPED BONE,MC   Vital Signs:  Patient profile:   50 year old female Pulse rate:   80 / minute BP sitting:   134 / 95  (right arm) CC: f/u foot injury- fx left foot, stepped in hole   Primary Care Provider:  Mliss Sax MD  CC:  f/u foot injury- fx left foot and stepped in hole.  History of Present Illness: 3 weeks ago she stepped into a hole and turned her ankle. was seen at [facility] and had xrays---they told her she had broken a bone in her foot an placed her into postop soe. She is some better but still pain with standing and walking.  Initially had a  lot of ankle swelling--taht has improved but not resolved.  PERTINENT PMH/PSH: no prior ankle or foot injury on left.  Current Medications (verified): 1)  Glucotrol Xl 10 Mg Xr24h-Tab (Glipizide) .... Take 1 Tablet By Mouth Once A Day 2)  Metformin Hcl 1000 Mg Tabs (Metformin Hcl) .... Take 1 Tablet By Mouth Two Times A Day 3)  Calcium 600 1500 Mg Tabs (Calcium Carbonate) .... Take 1 Tablet By Mouth Two Times A Day 4)  Vitamin D 400 Unit Caps (Cholecalciferol) .... Take 1 Tablet By Mouth Once A Day 5)  Truetrack Test  Strp (Glucose Blood) .... To Test Blood Sugar 2x/day 6)  Cymbalta 60 Mg Cpep (Duloxetine Hcl) .... Take 2 Tablets By Mouth Once A Day 7)  Metoprolol Tartrate 25 Mg  Tabs (Metoprolol Tartrate) .... Take 1/2  Tablet By Mouth Two Times A Day 8)  Benicar 20 Mg Tabs (Olmesartan Medoxomil) .... Take 1 Tablet By Mouth Once A Day 9)  Bactroban 2 % Oint (Mupirocin) .... Apply Three Times A Day 10)  Neurontin 300 Mg Caps (Gabapentin) .... Take 2 Caps Three Times A Day 11)  Ulticare Insulin Syringe 30g X 5/16" 0.5 Ml Misc (Insulin Syringe-Needle U-100) .... Use 2-3 Times Per Day When You Are Checking Your Sugar Level 12)  Ulticare Short Pen Needles 31g X 8 Mm Misc (Insulin Pen Needle) .... Use 2-3 Times Per Day When You Are Checking Your Sugar Level 13)  Prilosec Otc 20 Mg Tbec (Omeprazole Magnesium)  .... Take 1 Tablet By Mouth Once A Day 14)  Trazodone Hcl 100 Mg Tabs (Trazodone Hcl) .... Take 1 Tablet By Mouth At Bedtime 15)  Abilify 5 Mg Tabs (Aripiprazole) .... Take 1 Tablet By Mouth Once A Day 16)  Tramadol Hcl 50 Mg Tabs (Tramadol Hcl) .Marland Kitchen.. 1 Tab Every 6 Hrs As Needed For Pain 17)  Vicodin 5-500 Mg Tabs (Hydrocodone-Acetaminophen) .... Twice A Day As Needed For Pain 18)  Ciprofloxacin Hcl 500 Mg Tabs (Ciprofloxacin Hcl) .... Take 1 Tablet By Mouth Two Times A Day 19)  Zoloft 25 Mg Tabs (Sertraline Hcl) .... Take 1 Tablet By Mouth Once A Day  Allergies: No Known Drug Allergies  Past History:  Past Medical History: Last updated: 09/16/2009 analgesics reviewed-- currently on neurontin, cymbalta, amitriptyline, lidoderm to hips Depression Diabetes mellitus, type II Hypertension Obesity Tobacco abuse Low back pain Cocaine use and etoh- sober since 2001 Hepatitis C Insomnia  Review of Systems  The patient denies fever, weight loss, and weight gain.         no numness in foot or toes  Physical Exam  General:  alert and well-developed.  is weraing a soft cervical collar and a right ankle brace. Msk:  Left foot some soft tissue swelling over  ATF area and she is tender there. No echymoses. Distallly neurovasclularly intact. Ankle stable to antreas. erior drawer, inversion and eversion. TTP dorsum of foot MT 1-3  Additional Exam:  limited US of foot / ankle I see no MT fracture, Small ankle effusion is present.  Reviewed her xray report from [facility] ED. Scanned to chart. imagesnot available,   Impression & Recommendations:  Problem # 1:  ANKLE PAIN (ICD-719.47) ankle sprain with possibly small ausion fracture as described n her x ray report Orders: Ankle Training Brace/ASO Support [phone]) [place] LIMITED [phone]) ASO brace and WBAT and rtc 2 weeks--HEP program with theraband given and explained.   Complete Medication List: 1)  Glucotrol Xl 10 Mg Xr24h-tab (Glipizide) ....  Take 1 tablet by mouth once a day 2)  Metformin Hcl 1000 Mg Tabs (Metformin hcl) .... Take 1 tablet by mouth two times a day 3)  Calcium 600 1500 Mg Tabs (Calcium carbonate) .... Take 1 tablet by mouth two times a day 4)  Vitamin D 400 Unit Caps (Cholecalciferol) .... Take 1 tablet by mouth once a day 5)  Truetrack Test Strp (Glucose blood) .... To test blood sugar 2x/day 6)  Cymbalta 60 Mg Cpep (Duloxetine hcl) .... Take 2 tablets by mouth once a day 7)  Metoprolol Tartrate 25 Mg Tabs (Metoprolol tartrate) .... Take 1/2  tablet by mouth two times a day 8)  Benicar 20 Mg Tabs (Olmesartan medoxomil) .... Take 1 tablet by mouth once a day 9)  Bactroban 2 % Oint (Mupirocin) .... Apply three times a day 10)  Neurontin 300 Mg Caps (Gabapentin) .... Take 2 caps three times a day 11)  Ulticare Insulin Syringe 30g X 5/16" 0.5 Ml Misc (Insulin syringe-needle u-100) .... Use 2-3 times per day when you are checking your sugar level 12)  Ulticare Short Pen Needles 31g X 8 Mm Misc (Insulin pen needle) .... Use 2-3 times per day when you are checking your sugar level 13)  Prilosec Otc 20 Mg Tbec (Omeprazole magnesium) .... Take 1 tablet by mouth once a day 14)  Trazodone Hcl 100 Mg Tabs (Trazodone hcl) .... Take 1 tablet by mouth at bedtime 15)  Abilify 5 Mg Tabs (Aripiprazole) .... Take 1 tablet by mouth once a day 16)  Tramadol Hcl 50 Mg Tabs (Tramadol hcl) .Marland Kitchen.. 1 tab every 6 hrs as needed for pain 17)  Vicodin 5-500 Mg Tabs (Hydrocodone-acetaminophen) .... Twice a day as needed for pain 18)  Ciprofloxacin Hcl 500 Mg Tabs (Ciprofloxacin hcl) .... Take 1 tablet by mouth two times a day 19)  Zoloft 25 Mg Tabs (Sertraline hcl) .... Take 1 tablet by mouth once a day Prescriptions: VICODIN 5-500 MG TABS (HYDROCODONE-ACETAMINOPHEN) Twice a day as needed for pain  #20 x 0   Entered and Authorized by:   Denny Levy MD   Signed by:   Denny Levy MD on 09/28/2010   Method used:   Print then Give to Patient   RxID:    [id number]    Orders Added: 1)  Ankle Training Brace/ASO Support [L1902] 2)  Est. Patient Level III [06301] 3)  [place] LIMITED [60109]

## 2011-01-05 NOTE — Progress Notes (Signed)
Summary: MRI  Phone Note Other Incoming   Caller: [facility] Summary of Call: Call from [facility].  Pt will need a MRI of Lumbar without contrast.done prior to getting the injections.  Once done pt will then be scheduled. Initial call taken by: Angelina Ok RN,  July 07, 2010 3:51 PM  Follow-up for Phone Call        completed, thank you [name]  Follow-up by: Mliss Sax MD,  July 07, 2010 4:16 PM

## 2011-01-05 NOTE — Assessment & Plan Note (Signed)
Summary: CORT SHOT/KNEE,MC   Vital Signs:  Patient profile:   50 year old female BP sitting:   90 / 60  Vitals Entered By: Lillia Pauls CMA (February 09, 2010 10:45 AM)  Primary Provider:  Mliss Sax MD   History of Present Illness: Left knee pain: DJD on Xrays  bilateral knees, but worse on L knee.  Last injection 09/2009 which gave about 50% relief.  Was able to walk after that injection.  Walking 2/wk, about 45 min each time.  knee pain worse with walking or standing.  Ankle pain all the time.    Would like refill on Vicodin 5-500. Last refilled on 01/30/10 for #20 tabs.  Pt takes this two times a day.  She also takes Tramadol 50mg  two times a day.   Allergies: No Known Drug Allergies   Knee Exam  General:    Well-developed, well-nourished, normal body habitus; no deformities, normal grooming.  Gait:    Unsteadyl heel-toe gait pattern bilaterally.    Knee Exam:    Right:    Inspection:  Normal    Palpation:  Normal    Stability:  stable    Tenderness:  no    Swelling:  no    Erythema:  no    Left:    Inspection:  Normal    Palpation:  Normal    Stability:  stable    Tenderness:  no    Swelling:  no    Erythema:  no    There are no changes in the appearance of chronic DJD as noted on prior exams no real effusion noted today on exam [facility] [place] does not show fluid in suprapatellar pouch    Impression & Recommendations:  Problem # 1:  KNEE PAIN, BILATERAL (ICD-719.46)  Consent obtained and verified. Sterile alcohol prep.  Topical analgesic spray: Ethyl chloride. Joint:left knee Completed without difficulty Meds: lidocaine + kenolog Needle:  Aftercare instructions and Red flags advised.  Pt advised to discuss vicodin refills with her PCP.  Since her DJD is chronic she needs to use the medicine only for her worse pain and use tramadol for more minor pain flares  Her updated medication list for this problem includes:    Tramadol Hcl 50 Mg Tabs (Tramadol hcl)  .Marland Kitchen... 1 tab every 6 hrs as needed for pain    Vicodin 5-500 Mg Tabs (Hydrocodone-acetaminophen) .Marland Kitchen... 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 1 tab every 6 hrs as needed for pain    Vicodin 5-500 Mg Tabs (Hydrocodone-acetaminophen) .Marland Kitchen... Twice a day as needed for pain  advanced DJD  manage with conservative care keep up exercise program to tolerance periodic injections  Complete Medication List: 1)  Glucotrol Xl 10 Mg Xr24h-tab (Glipizide) .... Take 1 tablet by mouth once a day 2)  Metformin Hcl 1000 Mg Tabs (Metformin hcl) .... Take 1 tablet by mouth two times a day 3)  Calcium 600 1500 Mg Tabs (Calcium carbonate) .... Take 1 tablet by mouth two times a day 4)  Vitamin D 400 Unit Caps (Cholecalciferol) .... Take 1 tablet by mouth once a day 5)  Truetrack Test Strp (Glucose blood) .... To test blood sugar 2x/day 6)  Cymbalta 60 Mg Cpep (Duloxetine hcl) .... Take 2  tablets by mouth once a day 7)  Lidoderm 5 % Ptch (Lidocaine) .... Apply on affected area once a day for 12 hours only, then off for 12 hours. 8)  Metoprolol Tartrate 25 Mg Tabs (Metoprolol tartrate) .... Take 1 tablet by mouth two times a day 9)  Benicar 20 Mg Tabs (Olmesartan medoxomil) .... Take 1 tablet by mouth once a day 10)  Bactroban 2 % Oint (Mupirocin) .... Apply three times a day 11)  Neurontin 300 Mg Caps (Gabapentin) .... Take 2 caps three times a day 12)  Ulticare Insulin Syringe 30g X 5/16" 0.5 Ml Misc (Insulin syringe-needle u-100) .... Use 2-3 times per day when you are checking your sugar level 13)  Ulticare Short Pen Needles 31g X 8 Mm Misc (Insulin pen needle) .... Use 2-3 times per day when you are checking your sugar level 14)  Prilosec Otc 20 Mg Tbec (Omeprazole magnesium)  .... Take 1 tablet by mouth once a day 15)  Trazodone Hcl 100 Mg Tabs (Trazodone hcl) .... Take 1 tablet by mouth at bedtime 16)  Abilify 5 Mg Tabs (Aripiprazole) .... Take 1 tablet by mouth once a day 17)  Tramadol Hcl 50 Mg Tabs (Tramadol hcl) .Marland Kitchen.. 1 tab every 6 hrs as needed for pain 18)  Vicodin 5-500 Mg Tabs (Hydrocodone-acetaminophen) .... Twice a day as needed for pain  Patient Instructions: 1)  Congratulations on your weight loss.!!! 2)  Please schedule a follow-up appointment in 3 months as needed for knee pain. 3)  Use ice for the next 2 days. 4)  Easy motion for the next few days.   5)  Then continue your routine exercises.

## 2011-01-05 NOTE — Discharge Summary (Signed)
Summary: Hospital Discharge Update    Hospital Discharge Update:  Date of Admission: 06/10/2010 Date of Discharge: 06/12/2010  Brief Summary:  Patient was admitted on 06/10/10 after 3 syncopal events which were unprecipitated and lasted a few minutes each.  Patient had a history of of PSVT with hypotension per EMR thus cardiac arrhythmia was of concern.  Work ups include Cardiac enzymes, TSH and d-dimer which were WNL.  CT of head on 06/10/10 was negative. Patient did not have any syncopal episodes or dizziness during this hospital admission.  She is to follow up with PCP and return to clinic or ED sooner if syncope is recurrent.   Lab or other results pending at discharge:  none  Other follow-up issues:  Check vitals to see if she still has dizziness May need to adjust BP medications  Medication list changes:  Changed medication from METOPROLOL TARTRATE 25 MG  TABS (METOPROLOL TARTRATE) Take 1 tablet by mouth two times a day to METOPROLOL TARTRATE 25 MG  TABS (METOPROLOL TARTRATE) Take 1/2  tablet by mouth two times a day  Discharge medications:  GLUCOTROL XL 10 MG XR24H-TAB (GLIPIZIDE) Take 1 tablet by mouth once a day METFORMIN HCL 1000 MG TABS (METFORMIN HCL) Take 1 tablet by mouth two times a day CALCIUM 600 1500 MG TABS (CALCIUM CARBONATE) Take 1 tablet by mouth two times a day VITAMIN D 400 UNIT CAPS (CHOLECALCIFEROL) Take 1 tablet by mouth once a day TRUETRACK TEST  STRP (GLUCOSE BLOOD) to test blood sugar 2x/day CYMBALTA 60 MG CPEP (DULOXETINE HCL) Take 2 tablets by mouth once a day LIDODERM 5 %  PTCH (LIDOCAINE) Apply on affected area once a day for 12 hours only, then off for 12 hours. METOPROLOL TARTRATE 25 MG  TABS (METOPROLOL TARTRATE) Take 1/2  tablet by mouth two times a day BENICAR 20 MG TABS (OLMESARTAN MEDOXOMIL) Take 1 tablet by mouth once a day BACTROBAN 2 % OINT (MUPIROCIN) apply three times a day NEURONTIN 300 MG CAPS (GABAPENTIN) Take 2 caps three times a  day ULTICARE INSULIN SYRINGE 30G X 5/16" 0.5 ML MISC (INSULIN SYRINGE-NEEDLE U-100) use 2-3 times per day when you are checking your sugar level ULTICARE SHORT PEN NEEDLES 31G X 8 MM MISC (INSULIN PEN NEEDLE) use 2-3 times per day when you are checking your sugar level PRILOSEC OTC 20 MG TBEC (OMEPRAZOLE MAGNESIUM) Take 1 tablet by mouth once a day TRAZODONE HCL 100 MG TABS (TRAZODONE HCL) Take 1 tablet by mouth at bedtime ABILIFY 5 MG TABS (ARIPIPRAZOLE) Take 1 tablet by mouth once a day TRAMADOL HCL 50 MG TABS (TRAMADOL HCL) 1 tab every 6 hrs as needed for pain VICODIN 5-500 MG TABS (HYDROCODONE-ACETAMINOPHEN) Twice a day as needed for pain  Other patient instructions:  1. Continue home medications 2. Please take 1/2 tablet of Metoprolol two times a day 2. Follow up with Dr. Eben Burow on July 15th, 2011 @ 10AM 3. Return to clinic or  ED if continue to pass out  Note: Hospital Discharge Medications & Other Instructions handout was printed, one copy for patient and a second copy to be placed in hospital chart.

## 2011-01-05 NOTE — Progress Notes (Signed)
Summary: refill/gg  Phone Note Refill Request  on August 12, 2010 3:45 PM  Refills Requested: Medication #1:  GLUCOTROL XL 10 MG XR24H-TAB Take 1 tablet by mouth once a day   Last Refilled: 07/07/2010  Method Requested: Fax to Local Pharmacy Initial call taken by: Merrie Roof RN,  August 12, 2010 3:46 PM  Follow-up for Phone Call        completed refill, thank you [name]  Follow-up by: Mliss Sax MD,  August 13, 2010 10:05 AM    Prescriptions: GLUCOTROL XL 10 MG XR24H-TAB (GLIPIZIDE) Take 1 tablet by mouth once a day  #31 x 1   Entered and Authorized by:   Mliss Sax MD   Signed by:   Mliss Sax MD on 08/13/2010   Method used:   Faxed to ...       [facility] [address] (retail)       [address]       [facility]       [place], [place]  [address]       Ph: [phone]       Fax: [phone]   RxID:   [id number]

## 2011-01-05 NOTE — Assessment & Plan Note (Signed)
Summary: ACUTE-BACK PAIN REQUESTING MRI/(MAGICK)/CFB   Vital Signs:  Patient profile:   50 year old female Height:      70.25 inches Weight:      315.4 pounds BMI:     45.10 Temp:     98.1 degrees F oral Pulse rate:   50 / minute BP sitting:   127 / 88  (right arm)  Vitals Entered By: Filomena Jungling NT II (January 30, 2010 11:27 AM) CC: BACK PAIN-  PATIENT FELL IN BATH TUB AND HURT HER BACK, Depression Is Patient Diabetic? No Pain Assessment Patient in pain? yes     Location: back Intensity: 10 Nutritional Status BMI of > 30 = obese CBG Result 212  Does patient need assistance? Functional Status Self care Ambulation Normal   Primary Care Provider:  Mliss Sax MD  CC:  BACK PAIN-  PATIENT FELL IN BATH TUB AND HURT HER BACK and Depression.  History of Present Illness: 50 y/o woman with PMH of HTN, HLD, DM and OA of multiple joints comes to the clinic complaining of back pain. She has a chronic lower back pain controlled with tramadol and OTC meds. She fell in her bathtub 2 days abck and the pain has been worse afer that. No weakness or numbness in hands and feet. Able towalk without difficulty. Pain is persistent throughout the day. More when bending forward. Requests a radiology referral for intrarticular injections as her sister recieved it in the past it helped her with back pain.   Depression History:      The patient denies a depressed mood most of the day and a diminished interest in her usual daily activities.         Preventive Screening-Counseling & Management  Alcohol-Tobacco     Alcohol type: AT TIMES  / BEER     Smoking Status: quit     Smoking Cessation Counseling: yes     Year Started: ABOUT 1 -2 CIGARETTES A WEEK     Year Quit: 2000  Caffeine-Diet-Exercise     Does Patient Exercise: yes     Type of exercise: walking     Exercise (avg: min/session): 30 min.     Times/week: 3  Current Medications (verified): 1)  Glucotrol Xl 10 Mg Xr24h-Tab  (Glipizide) .... Take 1 Tablet By Mouth Once A Day 2)  Metformin Hcl 1000 Mg Tabs (Metformin Hcl) .... Take 1 Tablet By Mouth Two Times A Day 3)  Calcium 600 1500 Mg Tabs (Calcium Carbonate) .... Take 1 Tablet By Mouth Two Times A Day 4)  Vitamin D 400 Unit Caps (Cholecalciferol) .... Take 1 Tablet By Mouth Once A Day 5)  Truetrack Test  Strp (Glucose Blood) .... To Test Blood Sugar 2x/day 6)  Cymbalta 60 Mg Cpep (Duloxetine Hcl) .... Take 2 Tablets By Mouth Once A Day 7)  Lidoderm 5 %  Ptch (Lidocaine) .... Apply On Affected Area Once A Day For 12 Hours Only, Then Off For 12 Hours. 8)  Metoprolol Tartrate 25 Mg  Tabs (Metoprolol Tartrate) .... Take 1 Tablet By Mouth Two Times A Day 9)  Benicar 20 Mg Tabs (Olmesartan Medoxomil) .... Take 1 Tablet By Mouth Once A Day 10)  Bactroban 2 % Oint (Mupirocin) .... Apply Three Times A Day 11)  Neurontin 300 Mg Caps (Gabapentin) .... Take 2 Caps Three Times A Day 12)  Ulticare Insulin Syringe 30g X 5/16" 0.5 Ml Misc (Insulin Syringe-Needle U-100) .... Use 2-3 Times Per Day When You Are Checking  Your Sugar Level 13)  Ulticare Short Pen Needles 31g X 8 Mm Misc (Insulin Pen Needle) .... Use 2-3 Times Per Day When You Are Checking Your Sugar Level 14)  Prilosec Otc 20 Mg Tbec (Omeprazole Magnesium) .... Take 1 Tablet By Mouth Once A Day 15)  Trazodone Hcl 100 Mg Tabs (Trazodone Hcl) .... Take 1 Tablet By Mouth At Bedtime 16)  Abilify 5 Mg Tabs (Aripiprazole) .... Take 1 Tablet By Mouth Once A Day 17)  Tramadol Hcl 50 Mg Tabs (Tramadol Hcl) .Marland Kitchen.. 1 Tab Every 6 Hrs As Needed For Pain 18)  Vicodin 5-500 Mg Tabs (Hydrocodone-Acetaminophen) .... Twice A Day As Needed For Pain  Allergies (verified): No Known Drug Allergies  Physical Exam  General:  alert, NAD, obese Head:  normocephalic.   atraumatic.   Eyes:  vision grossly intact, pupils equal, pupils round, and pupils reactive to light.   Ears:  R ear normal and L ear normal.   Nose:  no external  deformity.   Neck:  supple, full ROM, and no masses.   Lungs:  normal respiratory effort, no intercostal retractions, no accessory muscle use, normal breath sounds, and no wheezes.   Heart:  normal rate, regular rhythm, no murmur, and no gallop.   Abdomen:  soft, non-tender, normal bowel sounds, no distention, and no masses.   Msk:  normal ROM.  Back: TTP near sacrum, no paraspinal tenderness. Pain on flexion as well as extension but more with extension.  Pulses:  dorsalis pedis pulses normal bilaterally  Extremities:  no edema Neurologic:  OrientedX3, cranial nerver 2-12 intact,strength good in all extremities, sensations normal to light touch, reflexes 2+ b/l, gait normal    Impression & Recommendations:  Problem # 1:  LOW BACK PAIN, CHRONIC (ICD-724.2) Will increase her tramadol to Q6 hrs. She wants to see her radiologist for intrarticular injections for pain. She recied those in her shoulder in the past and helped with the pain. Will put in a referral and give 20 vicodins to treat pain till she sees her radiologist. No neuro impairment. Will get xray of spine to r/o fracture as the pain is worse after the fall  **DG Spine: Degenerative facet joint changes. No fracture  Her updated medication list for this problem includes:    Tramadol Hcl 50 Mg Tabs (Tramadol hcl) .Marland Kitchen... 1 tab every 6 hrs as needed for pain    Vicodin 5-500 Mg Tabs (Hydrocodone-acetaminophen) .Marland Kitchen... Twice a day as needed for pain  Orders: T-DG Lumbar Spine 2-3 Views [phone]) Radiology Referral (Radiology)  Discussed use of moist heat or ice, modified activities, medications, and stretching/strengthening exercises. Back care instructions given. To be seen in 51 weeks if no improvement; sooner if worsening of symptoms.   Problem # 2:  DIABETES MELLITUS, TYPE II (ICD-250.00) Assessment: Unchanged Well controlled.     Glucotrol Xl 10 Mg Xr24h-tab (Glipizide) .Marland Kitchen... Take 1 tablet by mouth once a day    Metformin Hcl 1000  Mg Tabs (Metformin hcl) .Marland Kitchen... Take 1 tablet by mouth two times a day    Benicar 20 Mg Tabs (Olmesartan medoxomil) .Marland Kitchen... Take 1 tablet by mouth once a day  Orders: T- Capillary Blood Glucose (98119) T-Hgb A1C (in-house) (14782NF) T-Comprehensive Metabolic Panel [phone])  Labs Reviewed: Creat: 0.92 (01/09/2009)     Last Eye Exam: Eye Exam done at [facility] (10/11/2007) Reviewed HgBA1c results: 6.4 (01/30/2010)  6.9 (09/04/2009)  Problem # 3:  HEPATITIS C (ICD-070.51) Last viral load measured in 2008.  No symptoms or physical exam findings suggestiveof decompensated liver disease. Contnue to observe.  Orders: T-CBC w/Diff ([id number])  Problem # 4:  DEPRESSION (ICD-311) Well controlled with current treatment plan. has a psychiatry follow up Her updated medication list for this problem includes:    Cymbalta 60 Mg Cpep (Duloxetine hcl) .Marland Kitchen... Take 2 tablets by mouth once a day    Trazodone Hcl 100 Mg Tabs (Trazodone hcl) .Marland Kitchen... Take 1 tablet by mouth at bedtime  Problem # 5:  HYPERTENSION (ICD-401.9) BP controlledl with benicar. Will get lipid profile today.   * CMET and lipid profile normal  Her updated medication list for this problem includes:    Metoprolol Tartrate 25 Mg Tabs (Metoprolol tartrate) .Marland Kitchen... Take 1 tablet by mouth two times a day    Benicar 20 Mg Tabs (Olmesartan medoxomil) .Marland Kitchen... Take 1 tablet by mouth once a day  Orders: T-Lipid Profile ([id number])  BP today: 127/88 Prior BP: 118/80 (10/14/2009)  Labs Reviewed: K+: 4.4 (01/09/2009) Creat: : 0.92 (01/09/2009)   Chol: 161 (01/09/2009)   HDL: 62 (01/09/2009)   LDL: 75 (01/09/2009)   TG: 119 (01/09/2009)  Complete Medication List: 1)  Glucotrol Xl 10 Mg Xr24h-tab (Glipizide) .... Take 1 tablet by mouth once a day 2)  Metformin Hcl 1000 Mg Tabs (Metformin hcl) .... Take 1 tablet by mouth two times a day 3)  Calcium 600 1500 Mg Tabs (Calcium carbonate) .... Take 1 tablet by mouth two times a  day 4)  Vitamin D 400 Unit Caps (Cholecalciferol) .... Take 1 tablet by mouth once a day 5)  Truetrack Test Strp (Glucose blood) .... To test blood sugar 2x/day 6)  Cymbalta 60 Mg Cpep (Duloxetine hcl) .... Take 2 tablets by mouth once a day 7)  Lidoderm 5 % Ptch (Lidocaine) .... Apply on affected area once a day for 12 hours only, then off for 12 hours. 8)  Metoprolol Tartrate 25 Mg Tabs (Metoprolol tartrate) .... Take 1 tablet by mouth two times a day 9)  Benicar 20 Mg Tabs (Olmesartan medoxomil) .... Take 1 tablet by mouth once a day 10)  Bactroban 2 % Oint (Mupirocin) .... Apply three times a day 11)  Neurontin 300 Mg Caps (Gabapentin) .... Take 2 caps three times a day 12)  Ulticare Insulin Syringe 30g X 5/16" 0.5 Ml Misc (Insulin syringe-needle u-100) .... Use 2-3 times per day when you are checking your sugar level 13)  Ulticare Short Pen Needles 31g X 8 Mm Misc (Insulin pen needle) .... Use 2-3 times per day when you are checking your sugar level 14)  Prilosec Otc 20 Mg Tbec (Omeprazole magnesium) .... Take 1 tablet by mouth once a day 15)  Trazodone Hcl 100 Mg Tabs (Trazodone hcl) .... Take 1 tablet by mouth at bedtime 16)  Abilify 5 Mg Tabs (Aripiprazole) .... Take 1 tablet by mouth once a day 17)  Tramadol Hcl 50 Mg Tabs (Tramadol hcl) .Marland Kitchen.. 1 tab every 6 hrs as needed for pain 18)  Vicodin 5-500 Mg Tabs (Hydrocodone-acetaminophen) .... Twice a day as needed for pain  Other Orders: T-Hemoccult Card-Multiple (take home) ([id number])  Patient Instructions: 1)  Please schedule a follow-up appointment in 1 month. 2)  Tobacco is very bad for your health and your loved ones! You Should stop smoking!. 3)  Stop Smoking Tips: Choose a Quit date. Cut down before the Quit date. decide what you will do as a substitute when you feel the urge to smoke(gum,toothpick,exercise). 4)  It is  important that you exercise regularly at least 20 minutes 5 times a week. If you develop chest pain, have severe  difficulty breathing, or feel very tired , stop exercising immediately and seek medical attention. 5)  You need to lose weight. Consider a lower calorie diet and regular exercise.  6)  It is not healthy  for men to drink more than 2-3 drinks per day or for women to drink more than 1-2 drinks per day. 7)  Schedule a colonoscopy/sigmoidoscopy to help detect colon cancer. 8)  You need to have a Pap Smear to prevent cervical cancer. 9)  Most patients (90%) with low back pain will improve with time (2-6 weeks). Keep active but avoid activities that are painful. Apply moist heat and/or ice to lower back several times a day. Prescriptions: VICODIN 5-500 MG TABS (HYDROCODONE-ACETAMINOPHEN) Twice a day as needed for pain  #20 x 0   Entered and Authorized by:   Bethel Born MD   Signed by:   Bethel Born MD on 01/30/2010   Method used:   Print then Give to Patient   RxID:   [id number] TRAMADOL HCL 50 MG TABS (TRAMADOL HCL) 1 tab every 6 hrs as needed for pain  #120 x 0   Entered and Authorized by:   Bethel Born MD   Signed by:   Bethel Born MD on 01/30/2010   Method used:   Print then Give to Patient   RxID:   [id number] TRAMADOL HCL 50 MG TABS (TRAMADOL HCL) 1 tab every 6 hrs as needed for pain  #120 x 0   Entered and Authorized by:   Bethel Born MD   Signed by:   Bethel Born MD on 01/30/2010   Method used:   Print then Give to Patient   RxID:   [id number] VICODIN 5-500 MG TABS (HYDROCODONE-ACETAMINOPHEN) Twice a day as needed for pain  #20 x 0   Entered and Authorized by:   Bethel Born MD   Signed by:   Bethel Born MD on 01/30/2010   Method used:   Print then Give to Patient   RxID:   [id number]   Prevention & Chronic Care Immunizations   Influenza vaccine: Fluvax 3+  (09/04/2009)    Tetanus booster: Not documented    Pneumococcal vaccine: Not documented  Colorectal Screening   Hemoccult: Not documented   Hemoccult action/deferral: Ordered   (01/30/2010)    Colonoscopy: Not documented   Colonoscopy action/deferral: Refused  (01/30/2010)  Other Screening   Pap smear: Not documented   Pap smear action/deferral: Not indicated S/P hysterectomy  (01/30/2010)    Mammogram: No specific mammographic evidence of malignancy.  No significant changes compared to previous study.  Assessment: BIRADS 1.   (08/28/2009)   Mammogram due: 09/2010   Smoking status: quit  (01/30/2010)  Diabetes Mellitus   HgbA1C: 6.4  (01/30/2010)   Hemoglobin A1C due: 08/08/2007    Eye exam: Eye Exam done at [facility]  (10/11/2007)   Diabetic eye exam action/deferral: Ophthalmology referral  (09/04/2009)   Eye exam due: 01/06/2011    Foot exam: yes  (03/24/2009)   High risk foot: Not documented   Foot care education: Not documented    Urine microalbumin/creatinine ratio: 6.6  (06/24/2008)   Urine microalbumin action/deferral: Deferred   Urine microalbumin/cr due: 04/17/2008    Diabetes flowsheet reviewed?: Yes   Progress toward A1C goal: Improved  Lipids   Total Cholesterol: 161  (01/09/2009)   LDL: 75  (01/09/2009)  LDL Direct: Not documented   HDL: 62  (01/09/2009)   Triglycerides: 119  (01/09/2009)  Hypertension   Last Blood Pressure: 127 / 88  (01/30/2010)   Serum creatinine: 0.92  (01/09/2009)   Serum potassium 4.4  (01/09/2009) CMP ordered     Hypertension flowsheet reviewed?: Yes   Progress toward BP goal: At goal  Self-Management Support :   Personal Goals (by the next clinic visit) :     Personal A1C goal: 7  (09/04/2009)     Personal blood pressure goal: 130/80  (09/04/2009)     Personal LDL goal: 100  (09/04/2009)    Patient will work on the following items until the next clinic visit to reach self-care goals:     Medications and monitoring: take my medicines every day, check my blood sugar  (01/30/2010)     Eating: drink diet soda or water instead of juice or soda, eat more vegetables, use fresh or  frozen vegetables, eat foods that are low in salt, eat baked foods instead of fried foods, eat fruit for snacks and desserts  (01/30/2010)     Activity: take a 30 minute walk every day  (01/30/2010)    Diabetes self-management support: Written self-care plan, Resources for patients handout  (01/30/2010)   Diabetes care plan printed   Last medical nutrition therapy: 04/27/2007    Hypertension self-management support: Written self-care plan, Resources for patients handout  (01/30/2010)   Hypertension self-care plan printed.      Resource handout printed.   Nursing Instructions: Give tetanus booster today Provide Hemoccult cards with instructions (see order)    Process Orders Check Orders Results:     [facility]: ABN not required for this insurance Tests Sent for requisitioning (January 31, 2010 12:28 PM):     01/30/2010: [facility] -- T-Comprehensive Metabolic Panel [[id number]] (signed)     01/30/2010: [facility] -- T-Lipid Profile [phone] (signed)     01/30/2010: [facility] -- [facility] w/Diff [[id number]] (signed)    Laboratory Results   Blood Tests   Date/Time Received: January 30, 2010 11:52 AM Date/Time Reported: Alric Quan  January 30, 2010 11:52 AM  HGBA1C: 6.4%   (Normal Range: Non-Diabetic - 3-6%   Control Diabetic - 6-8%) CBG Random:: 212mg /dL    Please disregard the duplicate presciptions for vicodin and tramadol.

## 2011-01-05 NOTE — Progress Notes (Signed)
Summary: med refill/gp  Phone Note Refill Request Message from:  Fax from Pharmacy on January 12, 2010 11:54 AM  Refills Requested: Medication #1:  METFORMIN HCL 1000 MG TABS Take 1 tablet by mouth two times a day   Last Refilled: 11/17/2009  Medication #2:  METOPROLOL TARTRATE 25 MG  TABS Take 1 tablet by mouth two times a day   Last Refilled: 11/17/2009  Medication #3:  BACTROBAN 2 % OINT apply three times a day   Last Refilled: 07/24/2009  Method Requested: Telephone to Pharmacy Initial call taken by: Chinita Pester RN,  January 12, 2010 11:55 AM  Follow-up for Phone Call        completed Follow-up by: Mliss Sax MD,  January 12, 2010 12:06 PM    Prescriptions: BACTROBAN 2 % OINT (MUPIROCIN) apply three times a day  #1 x 2   Entered and Authorized by:   Mliss Sax MD   Signed by:   Mliss Sax MD on 01/12/2010   Method used:   Faxed to ...       [facility] [address] (retail)       [address]       [facility]       [place], [place]  [address]       Ph: [phone]       Fax: [phone]   RxID:   [id number] METOPROLOL TARTRATE 25 MG  TABS (METOPROLOL TARTRATE) Take 1 tablet by mouth two times a day  #62 x 3   Entered and Authorized by:   Mliss Sax MD   Signed by:   Mliss Sax MD on 01/12/2010   Method used:   Faxed to ...       [facility] [address] (retail)       [address]       [facility]       [place], [place]  [address]       Ph: [phone]       Fax: [phone]   RxID:   [id number] METFORMIN HCL 1000 MG TABS (METFORMIN HCL) Take 1 tablet by mouth two times a day  #62 x 3   Entered and Authorized by:   Mliss Sax MD   Signed by:   Mliss Sax MD on 01/12/2010   Method used:   Faxed to ...       [facility] [address] (retail)       [address]       [facility]       [place], [place]  [address]       Ph: [phone]       Fax: [phone]   RxID:    [id number]

## 2011-01-05 NOTE — Letter (Signed)
Summary: Previsit letter  [facility]  [address] [place], [place] [address]   Phone: [phone]  Fax: [phone]       06/19/2010 MRN: [id number]  [facility] [address] [place], [place]  [address]  Dear Ms. Jarvis Newcomer,  Welcome to the Gastroenterology Division at [facility].    You are scheduled to see a nurse for your pre-procedure visit on 07-23-10 at 10:30a.m. on the 3rd floor at [facility], [address]  We ask that you try to arrive at our office 15 minutes prior to your appointment time to allow for check-in.  Your nurse visit will consist of discussing your medical and surgical history, your immediate family medical history, and your medications.    Please bring a complete list of all your medications or, if you prefer, bring the medication bottles and we will list them.  We will need to be aware of both prescribed and over the counter drugs.  We will need to know exact dosage information as well.  If you are on blood thinners (Coumadin, Plavix, Aggrenox, Ticlid, etc.) please call our office today/prior to your appointment, as we need to consult with your physician about holding your medication.   Please be prepared to read and sign documents such as consent forms, a financial agreement, and acknowledgement forms.  If necessary, and with your consent, a friend or relative is welcome to sit-in on the nurse visit with you.  Please bring your insurance card so that we may make a copy of it.  If your insurance requires a referral to see a specialist, please bring your referral form from your primary care physician.  No co-pay is required for this nurse visit.     If you cannot keep your appointment, please call [phone] to cancel or reschedule prior to your appointment date.  This allows [place] the opportunity to schedule an appointment for another patient in need of care.    Thank you for choosing  Gastroenterology for your  medical needs.  We appreciate the opportunity to care for you.  Please visit [place] at our website  to learn more about our practice.                     Sincerely.                                                                                                                   The Gastroenterology Division

## 2011-01-05 NOTE — Progress Notes (Signed)
Summary: refill/ hla  Phone Note Refill Request Message from:  Fax from Pharmacy  Refills Requested: Medication #1:  BENICAR 20 MG TABS Take 1 tablet by mouth once a day   Last Refilled: 12/1 Initial call taken by: Marin Roberts RN,  January 14, 2010 1:51 PM  Follow-up for Phone Call        completed Follow-up by: Mliss Sax MD,  January 14, 2010 4:37 PM    Prescriptions: BENICAR 20 MG TABS (OLMESARTAN MEDOXOMIL) Take 1 tablet by mouth once a day  #31 x 3   Entered and Authorized by:   Mliss Sax MD   Signed by:   Mliss Sax MD on 01/14/2010   Method used:   Faxed to ...       [facility] [address] (retail)       [address]       [facility]       [place], [place]  [address]       Ph: [phone]       Fax: [phone]   RxID:   [id number]

## 2011-01-05 NOTE — Letter (Signed)
Summary: Diabetic Instructions  [facility]  [address] [place], [place] [address]   Phone: [phone]  Fax: [phone]    [name] [birth year] MRN: [id number]   _X  _   ORAL DIABETIC MEDICATION INSTRUCTIONS  The day before your procedure:   Take your diabetic pill as you do normally  The day of your procedure:   Do not take your diabetic pill    We will check your blood sugar levels during the admission process and again in Recovery before discharging you home  ________________________________________________________________________  _ X _   INSULIN (LONG ACTING) MEDICATION INSTRUCTIONS (Lantus, NPH, 70/30, Humulin, Novolin-N)   The day before your procedure:   Take  your regular evening dose    The day of your procedure:   Do not take your morning dose

## 2011-01-05 NOTE — Assessment & Plan Note (Signed)
Summary: DM TEACHING/CH   Vital Signs:  Patient profile:   50 year old female Weight:      324.4 pounds BMI:     46.38 Is Patient Diabetic? Yes   Allergies: No Known Drug Allergies  MEDICAL NUTRITION THERAPY  Assessment:Patient reports concern about weight gain of 24 #. Recently lost son to heart disease, is grieving this loss by eating and smoking. Sees therapsit weekly. Will think about hospice grief counseling.   Wt: 302 09/2010 consistent with gain of 22 # over last year.: Medications: glucophage nad glucotrol xl 10 mg Blood sugars:see download- many hypoglycemic episodes 24 hour recall:breakfast= 3-6 carbs                      snack 0-2 carbs                       lunch-2-5 carbs                      snack-0 carbs                       dinner- 4 carbs                       snack- 2- 8 carbs Exercise:limited by heel spur nad motivation, thinking about rejoining gym Labs:A1C at target Estimated needs:2000-2200 calories &  ~300 g carb/day for weight loss  Diagnosis:  NI 5.8.4-Inconsistent carbohydrate intake as related to need to spread carb intake out over the day with use of Glucotrol XL as evidenced by patient report.   Intervention:  1- E:  1.6 advanced topicReviewed carb counting and importance of spreading carbs out with current medicine regimen 2- C 2.9 relapse prevention- Discussed self monitoring of weight/food intake as means of helping herself stay on track and know when she needs help.  3- RC 1.3 : collaboration with physican about hypoglycemic events   Monitoring: hypoglycemic events and understanding of carb sources and servings Evaluation:A1C and weight  Follow-up:as requested by patient- suggest 2-4 weeks  Complete Medication List: 1)  Glucotrol Xl 10 Mg Xr24h-tab (Glipizide) .... Take 1 tablet by mouth once a day 2)  Metformin Hcl 1000 Mg Tabs (Metformin hcl) .... Take 1 tablet by mouth two times a day 3)  Calcium 600 1500 Mg Tabs (Calcium  carbonate) .... Take 1 tablet by mouth two times a day 4)  Vitamin D 400 Unit Caps (Cholecalciferol) .... Take 1 tablet by mouth once a day 5)  Truetrack Test Strp (Glucose blood) .... To test blood sugar 2x/day 6)  Cymbalta 60 Mg Cpep (Duloxetine hcl) .... Take 2 tablets by mouth once a day 7)  Metoprolol Tartrate 25 Mg Tabs (Metoprolol tartrate) .... Take 1/2  tablet by mouth two times a day 8)  Benicar 20 Mg Tabs (Olmesartan medoxomil) .... Take 1 tablet by mouth once a day 9)  Bactroban 2 % Oint (Mupirocin) .... Apply three times a day 10)  Neurontin 300 Mg Caps (Gabapentin) .... Take 2 caps three times a day 11)  Ulticare Insulin Syringe 30g X 5/16" 0.5 Ml Misc (Insulin syringe-needle u-100) .... Use 2-3 times per day when you are checking your sugar level 12)  Ulticare Short Pen Needles 31g X 8 Mm Misc (Insulin pen needle) .... Use 2-3 times per day when you are checking your sugar level 13)  Prilosec Otc 20 Mg Tbec (Omeprazole  magnesium) .... Take 1 tablet by mouth once a day 14)  Trazodone Hcl 100 Mg Tabs (Trazodone hcl) .... Take 1 tablet by mouth at bedtime 15)  Abilify 5 Mg Tabs (Aripiprazole) .... Take 1 tablet by mouth once a day 16)  Tramadol Hcl 50 Mg Tabs (Tramadol hcl) .Marland Kitchen.. 1 tab every 6 hrs as needed for pain 17)  Vicodin 5-500 Mg Tabs (Hydrocodone-acetaminophen) .... Twice a day as needed for pain 18)  Ciprofloxacin Hcl 500 Mg Tabs (Ciprofloxacin hcl) .... Take 1 tablet by mouth two times a day 19)  Zoloft 25 Mg Tabs (Sertraline hcl) .... Take 1 tablet by mouth once a day 20)  Colyte With Flavor Packs 240 Gm Solr (Peg 3350-kcl-nabcb-nacl-nasulf) .... As per prep instructions. 21)  Dulcolax 5 Mg Tbec (Bisacodyl) .... Day before procedure take 2 at 12 noon 22)  Metoclopramide Hcl 10 Mg Tabs (Metoclopramide hcl) .... As per prep instructions.  Other Orders: MNT/Reassessment and Intervention, Individual, 15 Minutes [phone])

## 2011-01-05 NOTE — Progress Notes (Signed)
  Phone Note Other Incoming   Request: Send information Summary of Call: Attorney request from [facility] forwarded to healthport.

## 2011-01-05 NOTE — Progress Notes (Signed)
Summary: refill/gg  Phone Note Refill Request  on June 09, 2010 4:09 PM  Refills Requested: Medication #1:  GLUCOTROL XL 10 MG XR24H-TAB Take 1 tablet by mouth once a day   Last Refilled: 11/12/2009 # 200   Method Requested: Fax to Local Pharmacy Initial call taken by: Merrie Roof RN,  June 09, 2010 4:09 PM  Follow-up for Phone Call        Refill approved-nurse to complete.  Please schedule a follow-up appointment and notify patient. Follow-up by: Margarito Liner MD,  June 09, 2010 5:32 PM  Additional Follow-up for Phone Call Additional follow up Details #1::        Rx faxed to pharmacy [place] [facility] [place] Additional Follow-up by: Merrie Roof RN,  June 12, 2010 9:14 AM    Prescriptions: GLUCOTROL XL 10 MG XR24H-TAB (GLIPIZIDE) Take 1 tablet by mouth once a day  #31 x 1   Entered and Authorized by:   Margarito Liner MD   Signed by:   Margarito Liner MD on 06/09/2010   Method used:   Telephoned to ...       [facility] [address] (retail)       [address]       [facility]       [place], [place]  [address]       Ph: [phone]       Fax: [phone]   RxID:   [id number]

## 2011-01-05 NOTE — Progress Notes (Signed)
  Phone Note Outgoing Call   Summary of Call: Patient's urine ctx came back positive for Klebsiella pneumoniae.  Called patient and inquired about her urinary symptoms. Patient does not have  any symptoms suggestive of UTI or pyelo.  Advised pt to follow up with Dr. Eben Burow on 06/19/10. Given that this is asymptomatic bacteriuria, it is appropriate not to treat with abx.    Follow-up for Phone Call       Follow-up by: Rosana Berger MD,  June 15, 2010 12:06 PM

## 2011-01-05 NOTE — Letter (Signed)
Summary: Patient Notice- Polyp Results  [facility]  [address] [place], [place] [address]   Phone: [phone]  Fax: [phone]        August 07, 2010 MRN: [id number]    [facility] [address] [place], [place]  [address]    Dear [name],  I am pleased to inform you that the colon polyp(s) removed during your recent colonoscopy was (were) found to be benign (no cancer detected) upon pathologic examination.  I recommend you have a repeat colonoscopy examination in 5 years to look for recurrent polyps, as having colon polyps increases your risk for having recurrent polyps or even colon cancer in the future.  Should you develop new or worsening symptoms of abdominal pain, bowel habit changes or bleeding from the rectum or bowels, please schedule an evaluation with either your primary care physician or with me.  Continue treatment plan as outlined the day of your exam.  Please call us if you are having persistent problems or have questions about your condition that have not been fully answered at this time.  Sincerely,  Meryl Dare MD [facility]  This letter has been electronically signed by your physician.  Appended Document: Patient Notice- Polyp Results letter mailed 9.7.2011

## 2011-01-05 NOTE — Letter (Signed)
Summary: [name]  [name],P.C   Imported By: Marily Memos 10/22/2010 08:38:09  _____________________________________________________________________  External Attachment:    Type:   Image     Comment:   External Document

## 2011-01-05 NOTE — Miscellaneous (Signed)
Summary: PREP MEDS  Clinical Lists Changes  Medications: Added new medication of DULCOLAX 5 MG  TBEC (BISACODYL) Day before procedure take 2 at 12 NOON - Signed Added new medication of METOCLOPRAMIDE HCL 10 MG  TABS (METOCLOPRAMIDE HCL) As per prep instructions. - Signed Rx of DULCOLAX 5 MG  TBEC (BISACODYL) Day before procedure take 2 at 12 NOON;  #2 x 0;  Signed;  Entered by: Wyona Almas RN;  Authorized by: Meryl Dare MD FACG;  Method used: Print then Give to Patient Rx of METOCLOPRAMIDE HCL 10 MG  TABS (METOCLOPRAMIDE HCL) As per prep instructions.;  #2 x 0;  Signed;  Entered by: Wyona Almas RN;  Authorized by: Meryl Dare MD FACG;  Method used: Print then Give to Patient    Prescriptions: METOCLOPRAMIDE HCL 10 MG  TABS (METOCLOPRAMIDE HCL) As per prep instructions.  #2 x 0   Entered by:   Wyona Almas RN   Authorized by:   Meryl Dare MD [facility]   Signed by:   Wyona Almas RN on 07/23/2010   Method used:   Print then Give to Patient   RxID:   [id number] DULCOLAX 5 MG  TBEC (BISACODYL) Day before procedure take 2 at 12 NOON  #2 x 0   Entered by:   Wyona Almas RN   Authorized by:   Meryl Dare MD [facility]   Signed by:   Wyona Almas RN on 07/23/2010   Method used:   Print then Give to Patient   RxID:   [id number]

## 2011-01-05 NOTE — Procedures (Signed)
Summary: Colonoscopy  Patient: [name] Note: All result statuses are Final unless otherwise noted.  Tests: (1) Colonoscopy (COL)   COL Colonoscopy           DONE     [facility]     [address]     [place], [place]  [address]           COLONOSCOPY PROCEDURE REPORT           PATIENT:  [name], [name]  MR#:  [id number]     BIRTHDATE:  06/02/1961, 49 yrs. old  GENDER:  female     ENDOSCOPIST:  Judie Petit T. Russella Dar, MD, [facility]     Referred by:  Internal Medicine Teaching Service     PROCEDURE DATE:  08/06/2010     PROCEDURE:  Colonoscopy with snare polypectomy     ASA CLASS:  Class II     INDICATIONS:  1) Routine Risk Screening     MEDICATIONS:   Fentanyl 100 mcg IV, Versed 8 mg IV     DESCRIPTION OF PROCEDURE:   After the risks benefits and     alternatives of the procedure were thoroughly explained, informed     consent was obtained.  Digital rectal exam was performed and     revealed no abnormalities.   The LB [id number] endoscope     was introduced through the anus and advanced to the cecum, which     was identified by both the appendix and ileocecal valve, without     limitations.  The quality of the prep was good, using MoviPrep.     The instrument was then slowly withdrawn as the colon was fully     examined.     <<PROCEDUREIMAGES>>     FINDINGS:  Four polyps were found in the ascending colon. They     were 3 - 6 mm in size. Polyps were snared without cautery.     Retrieval was successful. Two polyps were found in the sigmoid     colon. They were 4 - 5 mm in size. Polyps were snared without     cautery. Retrieval was successful. A normal appearing cecum,     ileocecal valve, and appendiceal orifice were identified. The     hepatic flexure, transverse, splenic flexure, descending colon,     and rectum appeared unremarkable. Retroflexed views in the rectum     revealed internal hemorrhoids, small.  The time to cecum =  3.5     minutes. The scope was  then withdrawn (time =  12  min) from the     patient and the procedure completed.           COMPLICATIONS:  None           ENDOSCOPIC IMPRESSION:     1) 3 - 6 mm Four polyps in the ascending colon     2) 4 - 5 mm Two polyps in the sigmoid colon     3) Internal hemorrhoids           RECOMMENDATIONS:     1) Await pathology results     2) If the polyps removed today are adenomatous (pre-cancerous),     you will need a repeat colonoscopy in 5 years. Otherwise you     should continue to follow colorectal cancer screening guidelines     for "routine risk" patients with colonoscopy in 10 years.     Venita Lick. Russella Dar, MD, Clementeen Graham  n.     eSIGNED:   Lamarkus Nebel T. Maydelin Deming at 08/06/2010 10:38 AM           [name], [id number]  Note: An exclamation mark (!) indicates a result that was not dispersed into the flowsheet. Document Creation Date: 08/06/2010 10:38 AM _______________________________________________________________________  (1) Order result status: Final Collection or observation date-time: 08/06/2010 10:33 Requested date-time:  Receipt date-time:  Reported date-time:  Referring Physician:   Ordering Physician: Claudette Head [phone]) Specimen Source:  Source: Launa Grill Order Number: [id number] Lab site:   Appended Document: Colonoscopy     Procedures Next Due Date:    Colonoscopy: 08/2015

## 2011-01-05 NOTE — Assessment & Plan Note (Signed)
Summary: F/U FOOT,MC   Vital Signs:  Patient profile:   50 year old female Pulse rate:   101 / minute BP sitting:   176 / 87  (right arm)  Vitals Entered By: Rochele Pages RN (October 12, 2010 2:47 PM) CC: f/u Left foot 65% improved   Primary Care Provider:  Mliss Sax MD  CC:  f/u Left foot 65% improved.  History of Present Illness: left ankle / foot pain 50% better. has been wearing post op shoe as requested. No new sx  Habits & Providers  Alcohol-Tobacco-Diet     Tobacco Status: current     Tobacco Counseling: to quit use of tobacco products     Cigarette Packs/Day: 0.25     Year Started: 1978  Current Medications (verified): 1)  Glucotrol Xl 10 Mg Xr24h-Tab (Glipizide) .... Take 1 Tablet By Mouth Once A Day 2)  Metformin Hcl 1000 Mg Tabs (Metformin Hcl) .... Take 1 Tablet By Mouth Two Times A Day 3)  Calcium 600 1500 Mg Tabs (Calcium Carbonate) .... Take 1 Tablet By Mouth Two Times A Day 4)  Vitamin D 400 Unit Caps (Cholecalciferol) .... Take 1 Tablet By Mouth Once A Day 5)  Truetrack Test  Strp (Glucose Blood) .... To Test Blood Sugar 2x/day 6)  Cymbalta 60 Mg Cpep (Duloxetine Hcl) .... Take 2 Tablets By Mouth Once A Day 7)  Metoprolol Tartrate 25 Mg  Tabs (Metoprolol Tartrate) .... Take 1/2  Tablet By Mouth Two Times A Day 8)  Benicar 20 Mg Tabs (Olmesartan Medoxomil) .... Take 1 Tablet By Mouth Once A Day 9)  Bactroban 2 % Oint (Mupirocin) .... Apply Three Times A Day 10)  Neurontin 300 Mg Caps (Gabapentin) .... Take 2 Caps Three Times A Day 11)  Ulticare Insulin Syringe 30g X 5/16" 0.5 Ml Misc (Insulin Syringe-Needle U-100) .... Use 2-3 Times Per Day When You Are Checking Your Sugar Level 12)  Ulticare Short Pen Needles 31g X 8 Mm Misc (Insulin Pen Needle) .... Use 2-3 Times Per Day When You Are Checking Your Sugar Level 13)  Prilosec Otc 20 Mg Tbec (Omeprazole Magnesium) .... Take 1 Tablet By Mouth Once A Day 14)  Trazodone Hcl 100 Mg Tabs (Trazodone Hcl) .... Take  1 Tablet By Mouth At Bedtime 15)  Abilify 5 Mg Tabs (Aripiprazole) .... Take 1 Tablet By Mouth Once A Day 16)  Tramadol Hcl 50 Mg Tabs (Tramadol Hcl) .Marland Kitchen.. 1 Tab Every 6 Hrs As Needed For Pain 17)  Vicodin 5-500 Mg Tabs (Hydrocodone-Acetaminophen) .... Twice A Day As Needed For Pain 18)  Ciprofloxacin Hcl 500 Mg Tabs (Ciprofloxacin Hcl) .... Take 1 Tablet By Mouth Two Times A Day 19)  Zoloft 25 Mg Tabs (Sertraline Hcl) .... Take 1 Tablet By Mouth Once A Day  Allergies: No Known Drug Allergies  Social History: Smoking Status:  current Packs/Day:  0.25  Physical Exam  General:  alert, well-developed, and well-nourished.   Msk:  Left ankle no effusion. No tenderness to palpation. Ligamentously intact. Neurovascularlyintact. Some pain wit weight bearing but improved   Impression & Recommendations:  Problem # 1:  ANKLE PAIN (ICD-719.47) 50% improvement--will decrease post op shoe to 50% daytime wear, start ankle rehab--HEP and theraband given rtc 2 weks and expect her to be able to come out of post op shoe all together at that point.  Complete Medication List: 1)  Glucotrol Xl 10 Mg Xr24h-tab (Glipizide) .... Take 1 tablet by mouth once a day 2)  Metformin Hcl 1000 Mg Tabs (Metformin hcl) .... Take 1 tablet by mouth two times a day 3)  Calcium 600 1500 Mg Tabs (Calcium carbonate) .... Take 1 tablet by mouth two times a day 4)  Vitamin D 400 Unit Caps (Cholecalciferol) .... Take 1 tablet by mouth once a day 5)  Truetrack Test Strp (Glucose blood) .... To test blood sugar 2x/day 6)  Cymbalta 60 Mg Cpep (Duloxetine hcl) .... Take 2 tablets by mouth once a day 7)  Metoprolol Tartrate 25 Mg Tabs (Metoprolol tartrate) .... Take 1/2  tablet by mouth two times a day 8)  Benicar 20 Mg Tabs (Olmesartan medoxomil) .... Take 1 tablet by mouth once a day 9)  Bactroban 2 % Oint (Mupirocin) .... Apply three times a day 10)  Neurontin 300 Mg Caps (Gabapentin) .... Take 2 caps three times a day 11)   Ulticare Insulin Syringe 30g X 5/16" 0.5 Ml Misc (Insulin syringe-needle u-100) .... Use 2-3 times per day when you are checking your sugar level 12)  Ulticare Short Pen Needles 31g X 8 Mm Misc (Insulin pen needle) .... Use 2-3 times per day when you are checking your sugar level 13)  Prilosec Otc 20 Mg Tbec (Omeprazole magnesium) .... Take 1 tablet by mouth once a day 14)  Trazodone Hcl 100 Mg Tabs (Trazodone hcl) .... Take 1 tablet by mouth at bedtime 15)  Abilify 5 Mg Tabs (Aripiprazole) .... Take 1 tablet by mouth once a day 16)  Tramadol Hcl 50 Mg Tabs (Tramadol hcl) .Marland Kitchen.. 1 tab every 6 hrs as needed for pain 17)  Vicodin 5-500 Mg Tabs (Hydrocodone-acetaminophen) .... Twice a day as needed for pain 18)  Ciprofloxacin Hcl 500 Mg Tabs (Ciprofloxacin hcl) .... Take 1 tablet by mouth two times a day 19)  Zoloft 25 Mg Tabs (Sertraline hcl) .... Take 1 tablet by mouth once a day   Orders Added: 1)  Est. Patient Level III [22025]

## 2011-01-05 NOTE — Assessment & Plan Note (Signed)
Summary: FOOT INJECTION,MC   Vital Signs:  Patient profile:   50 year old female BP sitting:   130 / 86  Vitals Entered By: Lillia Pauls CMA (August 11, 2010 11:09 AM)  Primary Provider:  Mliss Sax MD   History of Present Illness: RT heel pain we injected this in august of 2010 and she got several months of relief last 2 mos has started hurting again  son died 12-Jul-2024of heart problem she has been stressed since not resting well  pain in foot hurts w too much standing  also has knee pain and gets some relief w don joy support had accidental fall onto knees playing w grandkids both are bruised at present  Allergies: No Known Drug Allergies  Physical Exam  General:  Well-developed,well-nourished,in no acute distress; alert,appropriate and cooperative throughout examination Msk:  Tender directly over insertion into medial border of calcaneus has fairly high arch shape no pain on calcaneal squeeze  bruises over ant tibia just below knee bilat   Impression & Recommendations:  Problem # 1:  PLANTAR FASCIITIS, BILATERAL (ICD-728.71)  This is pimarily on RT at present  will inject again arch straps did not help given heel cup today   cleanse with alcohol Topical analgesic spray : Ethyl choride RT Plantar fascia Approached in typical fashion with: Medial approach above fascia into space near med calcaneal insertion Completed without difficulty Meds: 1 cc kenalog 10 + 1.5 cc lidocaine Needle: 25 G 1.5 inch Aftercare instructions and Red flags advised  hopefully will get good relief again if not could repeat injection in 3 mos  Orders: Kenalog 10 mg inj ([id number]) Trigger Point Injection Single Tendon Origin/Insertion ([id number])  Complete Medication List: 1)  Glucotrol Xl 10 Mg Xr24h-tab (Glipizide) .... Take 1 tablet by mouth once a day 2)  Metformin Hcl 1000 Mg Tabs (Metformin hcl) .... Take 1 tablet by mouth two times a day 3)  Calcium 600 1500 Mg Tabs  (Calcium carbonate) .... Take 1 tablet by mouth two times a day 4)  Vitamin D 400 Unit Caps (Cholecalciferol) .... Take 1 tablet by mouth once a day 5)  Truetrack Test Strp (Glucose blood) .... To test blood sugar 2x/day 6)  Cymbalta 60 Mg Cpep (Duloxetine hcl) .... Take 2 tablets by mouth once a day 7)  Metoprolol Tartrate 25 Mg Tabs (Metoprolol tartrate) .... Take 1/2  tablet by mouth two times a day 8)  Benicar 20 Mg Tabs (Olmesartan medoxomil) .... Take 1 tablet by mouth once a day 9)  Bactroban 2 % Oint (Mupirocin) .... Apply three times a day 10)  Neurontin 300 Mg Caps (Gabapentin) .... Take 2 caps three times a day 11)  Ulticare Insulin Syringe 30g X 5/16" 0.5 Ml Misc (Insulin syringe-needle u-100) .... Use 2-3 times per day when you are checking your sugar level 12)  Ulticare Short Pen Needles 31g X 8 Mm Misc (Insulin pen needle) .... Use 2-3 times per day when you are checking your sugar level 13)  Prilosec Otc 20 Mg Tbec (Omeprazole magnesium) .... Take 1 tablet by mouth once a day 14)  Trazodone Hcl 100 Mg Tabs (Trazodone hcl) .... Take 1 tablet by mouth at bedtime 15)  Abilify 5 Mg Tabs (Aripiprazole) .... Take 1 tablet by mouth once a day 16)  Tramadol Hcl 50 Mg Tabs (Tramadol hcl) .Marland Kitchen.. 1 tab every 6 hrs as needed for pain 17)  Vicodin 5-500 Mg Tabs (Hydrocodone-acetaminophen) .... Twice a day as needed  for pain 18)  Ciprofloxacin Hcl 500 Mg Tabs (Ciprofloxacin hcl) .... Take 1 tablet by mouth two times a day 19)  Zoloft 25 Mg Tabs (Sertraline hcl) .... Take 1 tablet by mouth once a day 20)  Colyte With Flavor Packs 240 Gm Solr (Peg [id number]) .... As per prep instructions. 21)  Dulcolax 5 Mg Tbec (Bisacodyl) .... Day before procedure take 2 at 12 noon 22)  Metoclopramide Hcl 10 Mg Tabs (Metoclopramide hcl) .... As per prep instructions.

## 2011-01-05 NOTE — Assessment & Plan Note (Signed)
Summary: foot injury/gg   Vital Signs:  Patient profile:   50 year old female Height:      70.25 inches Weight:      324.9 pounds BMI:     46.45 Temp:     98.0 degrees F oral Pulse rate:   85 / minute BP sitting:   137 / 85  (right arm)  Vitals Entered By: Filomena Jungling NT II (September 14, 2010 4:00 PM) CC: MIDDLE TOES NUMBX , WANTS TO INCREASE PAIN MEDICINES., Depression Is Patient Diabetic? Yes Did you bring your meter with you today? No Pain Assessment Patient in pain? yes     Location: NECK,FOOT, Intensity: 7 Type: aching Onset of pain  Constant Nutritional Status BMI of > 30 = obese CBG Result 90  Have you ever been in a relationship where you felt threatened, hurt or afraid?No   Does patient need assistance? Functional Status Self care Ambulation Normal   Primary Care Provider:  Mliss Sax MD  CC:  MIDDLE TOES NUMBX , WANTS TO INCREASE PAIN MEDICINES., and Depression.  History of Present Illness: 50 y/o woman with DJD and multiple pain sites as per EMR comes to the clinic complainng for foot pain  site- right foot pain since 3 weeks, started when she fell down 3 weeks ago Xray done at that time showed chipped bone (closed fracture) was given splint, vicodin from the ED ([facility]) Pain is a little better but not all hte way pain free    Depression History:      The patient denies a depressed mood most of the day and a diminished interest in her usual daily activities.         Preventive Screening-Counseling & Management  Alcohol-Tobacco     Alcohol type: AT TIMES  / BEER     Smoking Status: quit     Smoking Cessation Counseling: yes     Year Started: ABOUT 1 -2 CIGARETTES A WEEK     Year Quit: 2000  Caffeine-Diet-Exercise     Does Patient Exercise: yes     Type of exercise: walking     Exercise (avg: min/session): 30 min.     Times/week: 3  Current Medications (verified): 1)  Glucotrol Xl 10 Mg Xr24h-Tab (Glipizide) ....  Take 1 Tablet By Mouth Once A Day 2)  Metformin Hcl 1000 Mg Tabs (Metformin Hcl) .... Take 1 Tablet By Mouth Two Times A Day 3)  Calcium 600 1500 Mg Tabs (Calcium Carbonate) .... Take 1 Tablet By Mouth Two Times A Day 4)  Vitamin D 400 Unit Caps (Cholecalciferol) .... Take 1 Tablet By Mouth Once A Day 5)  Truetrack Test  Strp (Glucose Blood) .... To Test Blood Sugar 2x/day 6)  Cymbalta 60 Mg Cpep (Duloxetine Hcl) .... Take 2 Tablets By Mouth Once A Day 7)  Metoprolol Tartrate 25 Mg  Tabs (Metoprolol Tartrate) .... Take 1/2  Tablet By Mouth Two Times A Day 8)  Benicar 20 Mg Tabs (Olmesartan Medoxomil) .... Take 1 Tablet By Mouth Once A Day 9)  Bactroban 2 % Oint (Mupirocin) .... Apply Three Times A Day 10)  Neurontin 300 Mg Caps (Gabapentin) .... Take 2 Caps Three Times A Day 11)  Ulticare Insulin Syringe 30g X 5/16" 0.5 Ml Misc (Insulin Syringe-Needle U-100) .... Use 2-3 Times Per Day When You Are Checking Your Sugar Level 12)  Ulticare Short Pen Needles 31g X 8 Mm Misc (Insulin Pen Needle) .... Use 2-3 Times Per Day When You  Are Checking Your Sugar Level 13)  Prilosec Otc 20 Mg Tbec (Omeprazole Magnesium) .... Take 1 Tablet By Mouth Once A Day 14)  Trazodone Hcl 100 Mg Tabs (Trazodone Hcl) .... Take 1 Tablet By Mouth At Bedtime 15)  Abilify 5 Mg Tabs (Aripiprazole) .... Take 1 Tablet By Mouth Once A Day 16)  Tramadol Hcl 50 Mg Tabs (Tramadol Hcl) .Marland Kitchen.. 1 Tab Every 6 Hrs As Needed For Pain 17)  Vicodin 5-500 Mg Tabs (Hydrocodone-Acetaminophen) .... Twice A Day As Needed For Pain 18)  Ciprofloxacin Hcl 500 Mg Tabs (Ciprofloxacin Hcl) .... Take 1 Tablet By Mouth Two Times A Day 19)  Zoloft 25 Mg Tabs (Sertraline Hcl) .... Take 1 Tablet By Mouth Once A Day  Allergies (verified): No Known Drug Allergies  Review of Systems  The patient denies anorexia, fever, weight loss, weight gain, vision loss, decreased hearing, hoarseness, chest pain, syncope, dyspnea on exertion, peripheral edema, prolonged  cough, headaches, hemoptysis, abdominal pain, melena, hematochezia, severe indigestion/heartburn, hematuria, incontinence, genital sores, muscle weakness, suspicious skin lesions, transient blindness, difficulty walking, depression, unusual weight change, abnormal bleeding, enlarged lymph nodes, angioedema, breast masses, and testicular masses.    Physical Exam  General:  alert, well-developed, and overweight-appearing.   Head:  normocephalic and atraumatic.   Eyes:  vision grossly intact, pupils equal, pupils round, and pupils reactive to light.   Ears:  R ear normal and L ear normal.   Nose:  no external deformity.   Neck:  supple and full ROM.   Chest Wall:  no deformities.   Lungs:  normal respiratory effort, no intercostal retractions, no accessory muscle use, and normal breath sounds.   Heart:  normal rate, regular rhythm, and no murmur.   Abdomen:  soft, non-tender, normal bowel sounds, and no masses.   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  will give 40 tabs of vicodin today  Her updated medication list for this problem includes:    Tramadol Hcl 50 Mg Tabs (Tramadol hcl) .Marland Kitchen... 1 tab every 6 hrs as needed for pain    Vicodin 5-500 Mg Tabs (Hydrocodone-acetaminophen) .Marland Kitchen... Twice a day as needed for pain  Orders: Sports Medicine (Sports Med)  Complete Medication List: 1)  Glucotrol Xl 10 Mg Xr24h-tab (Glipizide) .... Take 1 tablet by mouth once a  day 2)  Metformin Hcl 1000 Mg Tabs (Metformin hcl) .... Take 1 tablet by mouth two times a day 3)  Calcium 600 1500 Mg Tabs (Calcium carbonate) .... Take 1 tablet by mouth two times a day 4)  Vitamin D 400 Unit Caps (Cholecalciferol) .... Take 1 tablet by mouth once a day 5)  Truetrack Test Strp (Glucose blood) .... To test blood sugar 2x/day 6)  Cymbalta 60 Mg Cpep (Duloxetine hcl) .... Take 2 tablets by mouth once a day 7)  Metoprolol Tartrate 25 Mg Tabs (Metoprolol tartrate) .... Take 1/2  tablet by mouth two times a day 8)  Benicar 20 Mg Tabs (Olmesartan medoxomil) .... Take 1 tablet by mouth once a day 9)  Bactroban 2 % Oint (Mupirocin) .... Apply three times a day 10)  Neurontin 300 Mg Caps (Gabapentin) .... Take 2 caps three times a day 11)  Ulticare Insulin Syringe 30g X 5/16" 0.5 Ml Misc (Insulin syringe-needle u-100) .... Use 2-3 times per day when you are checking your sugar level 12)  Ulticare Short Pen Needles 31g X 8 Mm Misc (Insulin pen needle) .... Use 2-3 times per day when you are checking your sugar level 13)  Prilosec Otc 20 Mg Tbec (Omeprazole magnesium) .... Take 1 tablet by mouth once a day 14)  Trazodone Hcl 100 Mg Tabs (Trazodone hcl) .... Take 1 tablet by mouth at bedtime 15)  Abilify 5 Mg Tabs (Aripiprazole) .... Take 1 tablet by mouth once a day 16)  Tramadol Hcl 50 Mg Tabs (Tramadol hcl) .Marland Kitchen.. 1 tab every 6 hrs as needed for pain 17)  Vicodin 5-500 Mg Tabs (Hydrocodone-acetaminophen) .... Twice a day as needed for pain 18)  Ciprofloxacin Hcl 500 Mg Tabs (Ciprofloxacin hcl) .... Take 1 tablet by mouth two times a day 19)  Zoloft 25 Mg Tabs (Sertraline hcl) .... Take 1 tablet by mouth once a day  Other Orders: T- Capillary Blood Glucose ([id number]) T-Hgb A1C (in-house) ([id number])  Patient Instructions: 1)  Please schedule a follow-up appointment in 1 month. Prescriptions: VICODIN 5-500 MG TABS (HYDROCODONE-ACETAMINOPHEN) Twice a day as needed for pain  #20 x 0    Entered and Authorized by:   Bethel Born MD   Signed by:   Bethel Born MD on 09/14/2010   Method used:   Handwritten   RxID:   [id number]    * patinet is given 40 tablets and not 20 tablets of vicodin  Prevention & Chronic Care Immunizations   Influenza vaccine: Fluvax 3+  (09/04/2009)   Influenza vaccine deferral: Not indicated  (07/21/2010)    Tetanus booster: 06/19/2010: Tdap    Pneumococcal vaccine: Not documented  Colorectal Screening   Hemoccult: Not documented   Hemoccult action/deferral: Not indicated  (07/21/2010)    Colonoscopy: DONE  (08/06/2010)   Colonoscopy action/deferral: Refused  (07/21/2010)   Colonoscopy due: 08/2015  Other Screening   Pap smear: Not documented   Pap smear action/deferral: Not indicated S/P hysterectomy  (01/30/2010)    Mammogram: ASSESSMENT: Negative - BI-RADS 1^MM DIGITAL SCREENING  (08/31/2010)   Mammogram due: 09/2011   Smoking status: quit  (09/14/2010)  Diabetes Mellitus   HgbA1C: 6.3  (09/14/2010)   Hemoglobin A1C due: 08/08/2007    Eye exam: Eye Exam done at [facility]  (10/11/2007)   Diabetic eye exam action/deferral: Deferred  (06/19/2010)   Eye exam due: 01/06/2011    Foot exam: yes  (07/21/2010)   Foot exam action/deferral: Do today   High risk foot: No  (07/21/2010)   Foot care education: Done  (07/21/2010)    Urine microalbumin/creatinine ratio: 7.4  (06/19/2010)   Urine microalbumin action/deferral: Ordered   Urine microalbumin/cr due: 04/17/2008  Lipids   Total Cholesterol: 146  (01/30/2010)   LDL: 63  (01/30/2010)   LDL Direct: Not documented   HDL: 51  (01/30/2010)   Triglycerides: 158  (01/30/2010)  Hypertension   Last Blood Pressure: 137 / 85  (09/14/2010)   Serum creatinine: 0.97  (01/30/2010)   Serum potassium 4.2  (01/30/2010)  Self-Management Support :   Personal Goals (by the next clinic visit) :  Personal A1C goal: 7  (09/04/2009)     Personal blood pressure  goal: 130/80  (09/04/2009)     Personal LDL goal: 100  (09/04/2009)    Patient will work on the following items until the next clinic visit to reach self-care goals:     Medications and monitoring: take my medicines every day, check my blood sugar, examine my feet every day  (09/14/2010)     Eating: drink diet soda or water instead of juice or soda, eat more vegetables, use fresh or frozen vegetables, eat foods that are low in salt, eat baked foods instead of fried foods, eat fruit for snacks and desserts, limit or avoid alcohol  (09/14/2010)     Activity: take a 30 minute walk every day  (09/14/2010)    Diabetes self-management support: Education handout, Resources for patients handout  (09/14/2010)   Diabetes education handout printed   Last medical nutrition therapy: 04/27/2007    Hypertension self-management support: Education handout, Resources for patients handout  (09/14/2010)   Hypertension education handout printed      Resource handout printed.   Laboratory Results   Blood Tests   Date/Time Received: September 14, 2010 4:21 PM Date/Time Reported: Alric Quan  September 14, 2010 4:21 PM    HGBA1C: 6.3%   (Normal Range: Non-Diabetic - 3-6%   Control Diabetic - 6-8%) CBG Random:: 90mg /dL

## 2011-01-05 NOTE — Assessment & Plan Note (Signed)
Summary: INJECTION IN BOTH FEET,MC   Primary Provider:  Mliss Sax MD  CC:  b/l foot pain and possible injections.  History of Present Illness: 50yo female to office for bilateral foot pain & possible injection. Hx of plantar fasciitis & peripheral neuropathy.  Currently using gel heel cups which help some. Has sports insoles, but does not wear them often. Worsening pain over the last 66-months, increased with standing for long periods of time. Steroid injection in R heel 08/11/10, but did not help at all.  Had previous injection that was helpful. Previous injection in L heel that was helpful for several months. Taking tramadol three times a day, vicodin three times a day, gabapentin three times a day, & cymbalta which help some. Insterested in possible repeat injections. Also note recent L ankle sprain 71-month ago.  Pain is improving.  Also c/o L knee pain. Hx of advanced DJD & baker's cyst. Increased posterior-lateral knee pain over several weeks. Continues to use Don-joy knee brace which helps some. Occasional clicking, denies catching, locking, instability. Some night-time pain.  Allergies: No Known Drug Allergies  Past History:  Past Medical History: Last updated: 09/16/2009 analgesics reviewed-- currently on neurontin, cymbalta, amitriptyline, lidoderm to hips Depression Diabetes mellitus, type II Hypertension Obesity Tobacco abuse Low back pain Cocaine use and etoh- sober since 2001 Hepatitis C Insomnia  Past Surgical History: Last updated: 08/31/2007 Hysterectomy- unknown reason (mass in uterus) - when she was 17  Review of Systems      See HPI  Physical Exam  General:  Obese, NAD, pleasant, AOx3 Msk:  KNEES: - L knee: ROM 0-120 with pain & crepitus.  TTP along medial/lateral joint lines, tender baker's cyst on lateral aspect of popliteal fossa.  No erythema or bruising.  No ligamentous instability.   - R knee: ROM 0-120, mild crepitus.  Non-tender to  palpation today.  No ligamentous instability.  No swelling or effusion.  FEET: b/l pes cavus with mild longitudinal arch collapse.  Significant transverse arch collapse.  TTP along insertion of PF on medial border of calcaneus b/l.    GAIT: walking with antalgic gait Pulses:  +2/4 DP & PT b/l Additional Exam:  MSK U/S: FEET:  L foot with PF measuring 0.57cm, significant thickening noted throughout foot.  Small heel spur & calcifications at PF insertion.  R foot with PF measuring 0.40cm, also with thickening throughout foot.  Small heel spur.  No increased doppler flow.  Images saved.   Impression & Recommendations:  Problem # 1:  FOOT PAIN, BILATERAL (ICD-729.5) - b/l foot pain with long hx of chronic PF - Fitted with arch straps in office today - Scaphoid pads placed in dress shoes, additional scaphoid pads given for additional pairs of shoes.  Recommend she wear sports insoles & supportive shoes as much as possible. - If symptoms persist could consider injections in the future  Orders: [place] LIMITED ([id number]) Foot Orthosis ( Arch Strap/Heel Cup) [phone])  Problem # 2:  PLANTAR FASCIITIS, BILATERAL (ICD-728.71) - Chronic PF - MSK u/s showing thickening of PF b/l - R>L - Fitted with arch straps - Scaphoid pads placed in dress shoes & additional pads given for other pairs of shoes.  Encouraged to wear sports insoles & supportive shoes as much as possible. - Will defer injections at this time, but consider in futurre if no improvement with arch straps & scaphoid pads - Cont. to use heel cups - Cont. stretching exercises - f/u prn  Orders: [place] LIMITED ([id number])  Foot Orthosis ( Arch Strap/Heel Cup) [id number])  Problem # 3:  KNEE PAIN, LEFT (ICD-719.46)  - Hx of advanced DJD on previous x-rays.  Prominent Baker's Cyst noted on exam today - Continue using knee brace as needed - Explained given advanced DJD she will likely continue to have pain unless decides to undergo knee replacement, given  her comorbidities she is not a great candidate for this - Cont. medications as prescribed - f/u as needed, could consider steroid injection if symptoms worsening or persiting.  Her updated medication list for this problem includes:    Tramadol Hcl 50 Mg Tabs (Tramadol hcl) .Marland Kitchen... 1 tab every 6 hrs as needed for pain    Vicodin 5-500 Mg Tabs (Hydrocodone-acetaminophen) .Marland Kitchen... Twice a day as needed for pain  Orders: [place] LIMITED ([id number]) Foot Orthosis ( Arch Strap/Heel Cup) [phone])  Complete Medication List: 1)  Glucotrol Xl 10 Mg Xr24h-tab (Glipizide) .... Take 1 tablet by mouth once a day 2)  Metformin Hcl 1000 Mg Tabs (Metformin hcl) .... Take 1 tablet by mouth two times a day 3)  Calcium 600 1500 Mg Tabs (Calcium carbonate) .... Take 1 tablet by mouth two times a day 4)  Vitamin D 400 Unit Caps (Cholecalciferol) .... Take 1 tablet by mouth once a day 5)  Truetrack Test Strp (Glucose blood) .... To test blood sugar 2x/day 6)  Cymbalta 60 Mg Cpep (Duloxetine hcl) .... Take 2 tablets by mouth once a day 7)  Metoprolol Tartrate 25 Mg Tabs (Metoprolol tartrate) .... Take 1/2  tablet by mouth two times a day 8)  Benicar 20 Mg Tabs (Olmesartan medoxomil) .... Take 1 tablet by mouth once a day 9)  Bactroban 2 % Oint (Mupirocin) .... Apply three times a day 10)  Neurontin 300 Mg Caps (Gabapentin) .... Take 2 caps three times a day 11)  Ulticare Insulin Syringe 30g X 5/16" 0.5 Ml Misc (Insulin syringe-needle u-100) .... Use 2-3 times per day when you are checking your sugar level 12)  Ulticare Short Pen Needles 31g X 8 Mm Misc (Insulin pen needle) .... Use 2-3 times per day when you are checking your sugar level 13)  Prilosec Otc 20 Mg Tbec (Omeprazole magnesium) .... Take 1 tablet by mouth once a day 14)  Trazodone Hcl 100 Mg Tabs (Trazodone hcl) .... Take 1 tablet by mouth at bedtime 15)  Abilify 5 Mg Tabs (Aripiprazole) .... Take 1 tablet by mouth once a day 16)  Tramadol Hcl 50 Mg Tabs (Tramadol  hcl) .Marland Kitchen.. 1 tab every 6 hrs as needed for pain 17)  Vicodin 5-500 Mg Tabs (Hydrocodone-acetaminophen) .... Twice a day as needed for pain 18)  Ciprofloxacin Hcl 500 Mg Tabs (Ciprofloxacin hcl) .... Take 1 tablet by mouth two times a day 19)  Zoloft 25 Mg Tabs (Sertraline hcl) .... Take 1 tablet by mouth once a day   Orders Added: 1)  Foot Orthosis ( Arch Strap/Heel Cup) [[id number]] 2)  Est. Patient Level III [29528] 3)  [place] LIMITED [[id number]] 4)  Foot Orthosis ( Arch Strap/Heel Cup) [[id number]]
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
## 2011-01-05 NOTE — Procedures (Signed)
Summary: Instructions for procedure/[facility]  Instructions for procedure/[facility]   Imported By: Sherian Rein 07/24/2010 14:21:06  _____________________________________________________________________  External Attachment:    Type:   Image     Comment:   External Document

## 2011-01-05 NOTE — Letter (Signed)
Summary: BLOOD GLUCOSE LOGBOOK 08/16-08/22/2011  BLOOD GLUCOSE LOGBOOK 08/16-08/22/2011   Imported By: Shon Hough 08/11/2010 10:24:33  _____________________________________________________________________  External Attachment:    Type:   Image     Comment:   External Document

## 2011-01-05 NOTE — Progress Notes (Signed)
Summary: Injections for pain  Phone Note Call from Patient   Caller: Patient Call For: Mliss Sax MD Complaint: Urinary/GYN Problems Summary of Call: Call from pt requests a referral to [facility].  RTC to pt to see why .  Phone not taking calls.  Wants to know if she can get Epidural Injections for her pain.  Said that her siter goes there.  Order will need to be sent to [facility].Angelina Ok RN  June 24, 2010 2:51 PM  Initial call taken by: Angelina Ok RN,  June 24, 2010 2:51 PM  Follow-up for Phone Call        I can do that Venita Sheffield, I am just not sure whaere to put in the order, is it under referals or diagnostic studies. Thank you Bradd Merlos Follow-up by: Mliss Sax MD,  June 25, 2010 10:22 AM  Additional Follow-up for Phone Call Additional follow up Details #1::        Will need to be sent as a referral for the Epidural Injections.  To be faxed to Raynelle Fanning at [facility] who will then schedule the appointment.Angelina Ok RN  June 25, 2010 10:45 AM  Additional Follow-up by: Angelina Ok RN,  June 25, 2010 10:45 AM    Additional Follow-up for Phone Call Additional follow up Details #2::    completed refill, thank you Elvy Mclarty  Follow-up by: Mliss Sax MD,  June 25, 2010 11:08 AM

## 2011-01-05 NOTE — Progress Notes (Signed)
Summary: Refill/gh  Phone Note Refill Request Message from:  Fax from Pharmacy on September 25, 2010 1:46 PM  Refills Requested: Medication #1:  TRAMADOL HCL 50 MG TABS 1 tab every 6 hrs as needed for pain   Last Refilled: 09/01/2010  Method Requested: Fax to Local Pharmacy Initial call taken by: Angelina Ok RN,  September 25, 2010 1:46 PM  Follow-up for Phone Call        completed refill, thank you Trenese Haft  Follow-up by: Mliss Sax MD,  September 25, 2010 1:51 PM    Prescriptions: TRAMADOL HCL 50 MG TABS (TRAMADOL HCL) 1 tab every 6 hrs as needed for pain  #120 x 7   Entered and Authorized by:   Mliss Sax MD   Signed by:   Mliss Sax MD on 09/25/2010   Method used:   Faxed to ...       [facility] [address] (retail)       [address]       [facility]       [place], [place]  [address]       Ph: [phone]       Fax: [phone]   RxID:   [id number]

## 2011-01-07 NOTE — Op Note (Signed)
Summary: Office Procedure  Office Procedure   Imported By: Marily Memos 11/25/2010 08:54:08  _____________________________________________________________________  External Attachment:    Type:   Image     Comment:   External Document

## 2011-01-12 ENCOUNTER — Other Ambulatory Visit: Payer: Self-pay | Admitting: Internal Medicine

## 2011-01-12 ENCOUNTER — Ambulatory Visit (INDEPENDENT_AMBULATORY_CARE_PROVIDER_SITE_OTHER): Payer: Self-pay | Admitting: Internal Medicine

## 2011-01-12 ENCOUNTER — Encounter: Payer: Self-pay | Admitting: Internal Medicine

## 2011-01-12 DIAGNOSIS — I10 Essential (primary) hypertension: Secondary | ICD-10-CM

## 2011-01-12 DIAGNOSIS — F3289 Other specified depressive episodes: Secondary | ICD-10-CM

## 2011-01-12 DIAGNOSIS — G894 Chronic pain syndrome: Secondary | ICD-10-CM

## 2011-01-12 DIAGNOSIS — E119 Type 2 diabetes mellitus without complications: Secondary | ICD-10-CM

## 2011-01-12 DIAGNOSIS — F329 Major depressive disorder, single episode, unspecified: Secondary | ICD-10-CM

## 2011-01-12 MED ORDER — HYDROCODONE-ACETAMINOPHEN 5-500 MG PO TABS: 1.0000 | ORAL_TABLET | Freq: Three times a day (TID) | ORAL | Status: DC | PRN

## 2011-01-12 MED ORDER — FLUOXETINE HCL 20 MG PO CAPS: 20.0000 mg | ORAL_CAPSULE | Freq: Every day | ORAL | Status: DC

## 2011-01-12 NOTE — Patient Instructions (Signed)
Please schedule an appointment in 3 months.

## 2011-01-13 ENCOUNTER — Encounter: Payer: Self-pay | Admitting: Internal Medicine

## 2011-01-13 MED ORDER — CALCIUM CARBONATE 1500 (600 CA) MG PO TABS: 1.0000 | ORAL_TABLET | Freq: Two times a day (BID) | ORAL | Status: DC

## 2011-01-13 MED ORDER — GABAPENTIN 300 MG PO CAPS: 600.0000 mg | ORAL_CAPSULE | Freq: Three times a day (TID) | ORAL | Status: DC

## 2011-01-13 MED ORDER — OMEPRAZOLE 20 MG PO CPDR: 20.0000 mg | DELAYED_RELEASE_CAPSULE | Freq: Every day | ORAL | Status: DC

## 2011-01-13 MED ORDER — CHOLECALCIFEROL 10 MCG (400 UNIT) PO TABS: 400.0000 [IU] | ORAL_TABLET | Freq: Every day | ORAL | Status: DC

## 2011-01-13 MED ORDER — GLIPIZIDE ER 10 MG PO TB24: 10.0000 mg | ORAL_TABLET | Freq: Every day | ORAL | Status: DC

## 2011-01-13 MED ORDER — OLMESARTAN MEDOXOMIL 20 MG PO TABS: 20.0000 mg | ORAL_TABLET | Freq: Every day | ORAL | Status: DC

## 2011-01-13 MED ORDER — DULOXETINE HCL 60 MG PO CPEP: 120.0000 mg | ORAL_CAPSULE | Freq: Every day | ORAL | Status: DC

## 2011-01-13 MED ORDER — METOPROLOL TARTRATE 25 MG PO TABS: 12.5000 mg | ORAL_TABLET | Freq: Two times a day (BID) | ORAL | Status: DC

## 2011-01-13 MED ORDER — TRAMADOL HCL 50 MG PO TABS: 50.0000 mg | ORAL_TABLET | Freq: Four times a day (QID) | ORAL | Status: DC | PRN

## 2011-01-13 MED ORDER — METFORMIN HCL 1000 MG PO TABS: 1000.0000 mg | ORAL_TABLET | Freq: Two times a day (BID) | ORAL | Status: DC

## 2011-01-13 MED ORDER — ARIPIPRAZOLE 5 MG PO TABS: 5.0000 mg | ORAL_TABLET | Freq: Every day | ORAL | Status: DC

## 2011-01-13 MED ORDER — TRAZODONE HCL 100 MG PO TABS: 100.0000 mg | ORAL_TABLET | Freq: Every day | ORAL | Status: DC

## 2011-01-13 MED ORDER — MUPIROCIN 2 % EX OINT: TOPICAL_OINTMENT | Freq: Three times a day (TID) | CUTANEOUS | Status: DC

## 2011-01-13 NOTE — Assessment & Plan Note (Signed)
Well controlled on current medication. However, pt has financial difficulty and wants to switch to new medication and will make the appropriate change today, will switch to another SSRI Fluoxetine. We have discussed potential side effects and length of therapy.

## 2011-01-13 NOTE — Assessment & Plan Note (Signed)
Discussed compliance issue and need for regular BP checks at the pharmacy or home. For now will continue the same regimen. Discussed goals.

## 2011-01-13 NOTE — Assessment & Plan Note (Signed)
No significant changes in A1C, remains 7.6 on current medication regimen. She is already on maximum dose metformin and we have discussed necessary dietary changes. Will continue the same medication regimen but have discussed the potential need for insulin in the near future if better control not achieved.

## 2011-01-13 NOTE — Progress Notes (Signed)
  Subjective:    Patient ID: [name], female    DOB: May 17, 1961, 50 y.o.   MRN: [id number]  HPI  50 yo female with PMH outlined below presents to [facility] for regular follow up on her blood pressure and also needs refill on her pain medication. She reports that her pain is well controlled on current pain medication regimen but she requires 2-3 tablets per day. She has had recent MRI of the back and does not know what the results showed. She reports loosing 3 pounds and that is helpful to her but feels like she needs to loose more weight. She reports compliance with current blood pressure medications. Additional problem for her is Zoloft, which she says it is expensive for her and she would like to be switched to cheaper medication if possible. She denies any episodes of chest pain, no abdominal or urinary concerns, no changes in mood or appetite, no suicidal ideations.   Review of Systems  Constitutional: Negative.   HENT: Negative.   Respiratory: Negative.   Cardiovascular: Negative.   Gastrointestinal: Negative.   Genitourinary: Negative.   Musculoskeletal: Negative.   Psychiatric/Behavioral: Negative.        Objective:   Physical Exam  Constitutional: She appears well-developed and well-nourished. No distress.  HENT:  Head: Normocephalic and atraumatic.  Right Ear: External ear normal.  Left Ear: External ear normal.  Nose: Nose normal.  Mouth/Throat: Oropharynx is clear and moist. No oropharyngeal exudate.  Eyes: Conjunctivae and EOM are normal. Pupils are equal, round, and reactive to light. Right eye exhibits no discharge. Left eye exhibits no discharge. No scleral icterus.  Neck: Normal range of motion. Neck supple. No JVD present. No tracheal deviation present. No thyromegaly present.  Cardiovascular: Normal rate, regular rhythm, normal heart sounds and intact distal pulses.  Exam reveals no gallop and no friction rub.   No murmur heard. Pulmonary/Chest: Effort normal  and breath sounds normal. No respiratory distress. She has no wheezes. She has no rales. She exhibits no tenderness.  Abdominal: Soft. Bowel sounds are normal. She exhibits no distension and no mass. There is no tenderness. There is no rebound and no guarding.  Musculoskeletal:       Lumbar back: She exhibits tenderness, bony tenderness and pain. She exhibits normal range of motion, no swelling, no edema, no deformity, no laceration, no spasm and normal pulse.  Lymphadenopathy:    She has no cervical adenopathy.  Skin: Skin is warm and dry. No rash noted. She is not diaphoretic. No erythema. No pallor.  Psychiatric: She has a normal mood and affect. Her behavior is normal. Judgment and thought content normal.          Assessment & Plan:

## 2011-01-13 NOTE — Assessment & Plan Note (Signed)
Per MRI done in August 2011 -   L4-L5 degenerative disc disease without stenosis noted in addition to bilateral L4-L5 and L5-S1 facet arthrosis, worst at L4-L5, will continue the same pain medication regimen.

## 2011-01-18 ENCOUNTER — Ambulatory Visit (INDEPENDENT_AMBULATORY_CARE_PROVIDER_SITE_OTHER): Payer: Self-pay | Admitting: Sports Medicine

## 2011-01-18 ENCOUNTER — Encounter: Payer: Self-pay | Admitting: Sports Medicine

## 2011-01-18 DIAGNOSIS — M722 Plantar fascial fibromatosis: Secondary | ICD-10-CM

## 2011-01-18 DIAGNOSIS — M159 Polyosteoarthritis, unspecified: Secondary | ICD-10-CM

## 2011-01-20 ENCOUNTER — Telehealth: Payer: Self-pay | Admitting: *Deleted

## 2011-01-20 NOTE — Telephone Encounter (Signed)
Pt walked in complaints of neck pain.  Said that pain is limiting her ability to turn her head.  Pt described the pain as arthritic.  Pt said that her present pain meds do relieve her knee pain but not her neck discomfort.  Dr. Aldine Contes was consulted.  Pt is already on 3 meds for pain.  Dr. Aldine Contes does not want to put pt on anything else at this time due to the risk of kidney and liver damage. Pt can use OTC meds like Icy Hot or other creams for arthritis.Pt can also take up 4 Ibuprofen tablets per day for relief.  Pt can also apply heat to her neck as well.  This was was discussed with the pt who said that she will try the OTC  arthrirtis cremes.  She will apply try applying heat as well.

## 2011-01-27 NOTE — Assessment & Plan Note (Signed)
Summary: HEEL PAIN AND KNEE PAIN/ARTHRITIS/MJD   Vital Signs:  Patient profile:   50 year old female BP sitting:   144 / 90  Vitals Entered By: Lillia Pauls CMA (January 18, 2011 11:35 AM)  Primary Provider:  Mliss Sax MD   History of Present Illness: Pt here for follow up or rt foot and bilat knee pain.  Had PF rt foot injection at last visit, did not feel this was helpful.  Continues to have rt foot pain which is fairly constant. States she can only stand for less than 1 hour due to foot pain usually only for 10 mins or so.  Wears ASO on both ankles at all times.  knees hurt worse when foot painful note does have severe DJD of knees morbid obesity has contibuted to generalized DJD of lower extremities  Allergies: No Known Drug Allergies  Physical Exam  General:  obese but in NAD Msk:  pain at medial heel on RT  chronic DJD changes both knees  gait is antalgic poor pushoof RT foot limps on RT   Impression & Recommendations:  Problem # 1:  PLANTAR FASCIITIS, BILATERAL (ICD-728.71) While there is no evidence for use in this area, I discussed a trial of NTG patches to RT heel our other options since she has failed injection, strap, heel  cups, etc is to send for surgical eval however, with no insurance we have limited options for her In addition her obesity and multiple problems make surgery greater risk and less likely to be successful  trial of NTG suing 0.1 patch to RT heel daily x 1 mo reck 1 month try scan of area at that point w [place]  Problem # 2:  OSTEOARTHRITIS, MULTIPLE JOINTS (ICD-715.89)  Her updated medication list for this problem includes:    Tramadol Hcl 50 Mg Tabs (Tramadol hcl) .Marland Kitchen... 1 tab every 6 hrs as needed for pain    Vicodin 5-500 Mg Tabs (Hydrocodone-acetaminophen) .Marland Kitchen... Twice a day as needed for pain  with her multiple DJD issues and her chronic RT foot pain patient advises me that she cannot stand for more than 10 mins without  sitting certainly she limps w walking I can see that she does not seem able of standing for as long as an hour 2/2 above problems and morbid obesity This information may be useful to her PCP if she tries to get some disability rating  Complete Medication List: 1)  Glucotrol Xl 10 Mg Xr24h-tab (Glipizide) .... Take 1 tablet by mouth once a day 2)  Metformin Hcl 1000 Mg Tabs (Metformin hcl) .... Take 1 tablet by mouth two times a day 3)  Calcium 600 1500 Mg Tabs (Calcium carbonate) .... Take 1 tablet by mouth two times a day 4)  Vitamin D 400 Unit Caps (Cholecalciferol) .... Take 1 tablet by mouth once a day 5)  Truetrack Test Strp (Glucose blood) .... To test blood sugar 2x/day 6)  Cymbalta 60 Mg Cpep (Duloxetine hcl) .... Take 2 tablets by mouth once a day 7)  Metoprolol Tartrate 25 Mg Tabs (Metoprolol tartrate) .... Take 1/2  tablet by mouth two times a day 8)  Benicar 20 Mg Tabs (Olmesartan medoxomil) .... Take 1 tablet by mouth once a day 9)  Bactroban 2 % Oint (Mupirocin) .... Apply three times a day 10)  Neurontin 300 Mg Caps (Gabapentin) .... Take 2 caps three times a day 11)  Ulticare Insulin Syringe 30g X 5/16" 0.5 Ml Misc (Insulin syringe-needle u-100) .Marland KitchenMarland KitchenMarland Kitchen  Use 2-3 times per day when you are checking your sugar level 12)  Ulticare Short Pen Needles 31g X 8 Mm Misc (Insulin pen needle) .... Use 2-3 times per day when you are checking your sugar level 13)  Prilosec Otc 20 Mg Tbec (Omeprazole magnesium) .... Take 1 tablet by mouth once a day 14)  Trazodone Hcl 100 Mg Tabs (Trazodone hcl) .... Take 1 tablet by mouth at bedtime 15)  Abilify 5 Mg Tabs (Aripiprazole) .... Take 1 tablet by mouth once a day 16)  Tramadol Hcl 50 Mg Tabs (Tramadol hcl) .Marland Kitchen.. 1 tab every 6 hrs as needed for pain 17)  Vicodin 5-500 Mg Tabs (Hydrocodone-acetaminophen) .... Twice a day as needed for pain 18)  Ciprofloxacin Hcl 500 Mg Tabs (Ciprofloxacin hcl) .... Take 1 tablet by mouth two times a day 19)  Zoloft 25  Mg Tabs (Sertraline hcl) .... Take 1 tablet by mouth once a day 20)  Nitroglycerin 0.1 Mg/hr Pt24 (Nitroglycerin) .... Apply 1 patch to the bottom or right foot daily.  change daily. Prescriptions: NITROGLYCERIN 0.1 MG/HR PT24 (NITROGLYCERIN) Apply 1 patch to the bottom or right foot daily.  Change daily.  #30 x 2   Entered by:   Ellin Mayhew MD   Authorized by:   Enid Baas MD   Signed by:   Ellin Mayhew MD on 01/18/2011   Method used:   Faxed to ...       [facility] [address] (retail)       [address]       [facility]       [place], [place]  [address]       Ph: [phone]       Fax: [phone]   RxID:   [id number]    Orders Added: 1)  Est. Patient Level III [id number]

## 2011-02-05 ENCOUNTER — Telehealth: Payer: Self-pay | Admitting: *Deleted

## 2011-02-05 DIAGNOSIS — M549 Dorsalgia, unspecified: Secondary | ICD-10-CM

## 2011-02-05 NOTE — Telephone Encounter (Signed)
Pt calls and ask for a referral to a place to get shots in her back, states she has spoken to you about this, she can't remember anything about it excepts she wants to go but she has no address, no names, no ph#'s

## 2011-02-09 ENCOUNTER — Ambulatory Visit (INDEPENDENT_AMBULATORY_CARE_PROVIDER_SITE_OTHER): Payer: Medicaid Other | Admitting: Family Medicine

## 2011-02-09 ENCOUNTER — Encounter: Payer: Self-pay | Admitting: Family Medicine

## 2011-02-09 DIAGNOSIS — M545 Low back pain, unspecified: Secondary | ICD-10-CM

## 2011-02-12 ENCOUNTER — Telehealth: Payer: Self-pay | Admitting: *Deleted

## 2011-02-12 NOTE — Telephone Encounter (Signed)
Call from the [facility] in [place].  Pt now has Medicaid and will unable to get medications from the Health Department.  Pt was given Glucotrol, Bactroban and Neurontin that had been sent to her through the program today.  Pt will need to take her prescriptions to a regular pharmacy.  Call from pt this was reinforced.  Pt to take bottles of her meds to her selected pharmacy to get her meds changed.
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
## 2011-02-17 LAB — GLUCOSE, CAPILLARY: Glucose-Capillary: 90 mg/dL (ref 70–99)

## 2011-02-18 LAB — GLUCOSE, CAPILLARY: Glucose-Capillary: 67 mg/dL — ABNORMAL LOW (ref 70–99)

## 2011-02-19 ENCOUNTER — Ambulatory Visit: Payer: Medicaid Other | Attending: Family Medicine | Admitting: Physical Therapy

## 2011-02-19 DIAGNOSIS — R269 Unspecified abnormalities of gait and mobility: Secondary | ICD-10-CM | POA: Insufficient documentation

## 2011-02-19 DIAGNOSIS — IMO0001 Reserved for inherently not codable concepts without codable children: Secondary | ICD-10-CM | POA: Insufficient documentation

## 2011-02-19 DIAGNOSIS — M25579 Pain in unspecified ankle and joints of unspecified foot: Secondary | ICD-10-CM | POA: Insufficient documentation

## 2011-02-19 DIAGNOSIS — R5381 Other malaise: Secondary | ICD-10-CM | POA: Insufficient documentation

## 2011-02-19 DIAGNOSIS — M6281 Muscle weakness (generalized): Secondary | ICD-10-CM | POA: Insufficient documentation

## 2011-02-20 LAB — GLUCOSE, CAPILLARY: Glucose-Capillary: 66 mg/dL — ABNORMAL LOW (ref 70–99)

## 2011-02-21 LAB — DIFFERENTIAL
Basophils Relative: 1 % (ref 0–1)
Eosinophils Absolute: 0.1 10*3/uL (ref 0.0–0.7)
Eosinophils Relative: 2 % (ref 0–5)
Lymphs Abs: 4.1 10*3/uL — ABNORMAL HIGH (ref 0.7–4.0)
Monocytes Relative: 8 % (ref 3–12)

## 2011-02-21 LAB — CARDIAC PANEL(CRET KIN+CKTOT+MB+TROPI)
CK, MB: 0.6 ng/mL (ref 0.3–4.0)
Relative Index: INVALID (ref 0.0–2.5)
Total CK: 38 U/L (ref 7–177)
Troponin I: 0.01 ng/mL (ref 0.00–0.06)

## 2011-02-21 LAB — BASIC METABOLIC PANEL
BUN: 15 mg/dL (ref 6–23)
CO2: 27 mEq/L (ref 19–32)
Calcium: 8.9 mg/dL (ref 8.4–10.5)
Chloride: 103 mEq/L (ref 96–112)
Creatinine, Ser: 1.13 mg/dL (ref 0.4–1.2)
GFR calc Af Amer: >60 mL/min (ref >60)
Glucose, Bld: 80 mg/dL (ref 70–99)

## 2011-02-21 LAB — URINE CULTURE: Colony Count: >=100000

## 2011-02-21 LAB — RAPID URINE DRUG SCREEN, HOSP PERFORMED
Amphetamines: POSITIVE — AB
Tetrahydrocannabinol: NOT DETECTED

## 2011-02-21 LAB — COMPREHENSIVE METABOLIC PANEL
ALT: 31 U/L (ref 0–35)
AST: 34 U/L (ref 0–37)
Albumin: 3.3 g/dL — ABNORMAL LOW (ref 3.5–5.2)
Alkaline Phosphatase: 82 U/L (ref 39–117)
CO2: 26 mEq/L (ref 19–32)
Chloride: 102 mEq/L (ref 96–112)
GFR calc Af Amer: >60 mL/min (ref >60)
Potassium: 4.1 mEq/L (ref 3.5–5.1)
Total Bilirubin: 0.4 mg/dL (ref 0.3–1.2)

## 2011-02-21 LAB — CBC
Hemoglobin: 11.2 g/dL — ABNORMAL LOW (ref 12.0–15.0)
MCH: 31 pg (ref 26.0–34.0)
MCHC: 33.2 g/dL (ref 30.0–36.0)
MCV: 93.4 fL (ref 78.0–100.0)
Platelets: 169 10*3/uL (ref 150–400)
Platelets: 189 10*3/uL (ref 150–400)
RBC: 3.64 MIL/uL — ABNORMAL LOW (ref 3.87–5.11)
RBC: 4 MIL/uL (ref 3.87–5.11)
WBC: 6.9 10*3/uL (ref 4.0–10.5)

## 2011-02-21 LAB — GLUCOSE, CAPILLARY
Glucose-Capillary: 150 mg/dL — ABNORMAL HIGH (ref 70–99)
Glucose-Capillary: 154 mg/dL — ABNORMAL HIGH (ref 70–99)

## 2011-02-21 LAB — URINALYSIS, ROUTINE W REFLEX MICROSCOPIC
Nitrite: POSITIVE — AB
Specific Gravity, Urine: 1.027 (ref 1.005–1.030)
Urobilinogen, UA: 1 mg/dL (ref 0.0–1.0)
pH: 5.5 (ref 5.0–8.0)

## 2011-02-21 LAB — D-DIMER, QUANTITATIVE: D-Dimer, Quant: <0.22 ug/mL-FEU (ref 0.00–0.48)

## 2011-02-21 LAB — URINE MICROSCOPIC-ADD ON

## 2011-02-21 LAB — POCT CARDIAC MARKERS

## 2011-02-24 ENCOUNTER — Ambulatory Visit: Payer: Medicaid Other | Admitting: Physical Therapy

## 2011-02-25 ENCOUNTER — Encounter: Payer: Self-pay | Admitting: Internal Medicine

## 2011-02-25 ENCOUNTER — Ambulatory Visit (INDEPENDENT_AMBULATORY_CARE_PROVIDER_SITE_OTHER): Payer: Medicaid Other | Admitting: Internal Medicine

## 2011-02-25 VITALS — BP 130/89 | HR 98 | Temp 97.6°F | Ht 71.0 in | Wt 324.4 lb

## 2011-02-25 DIAGNOSIS — E119 Type 2 diabetes mellitus without complications: Secondary | ICD-10-CM

## 2011-02-25 DIAGNOSIS — G894 Chronic pain syndrome: Secondary | ICD-10-CM

## 2011-02-25 LAB — GLUCOSE, CAPILLARY: Glucose-Capillary: 138 mg/dL — ABNORMAL HIGH (ref 70–99)

## 2011-02-25 MED ORDER — CHOLECALCIFEROL 10 MCG (400 UNIT) PO TABS: 400.0000 [IU] | ORAL_TABLET | Freq: Every day | ORAL | Status: DC

## 2011-02-25 MED ORDER — CALCIUM CARBONATE 1500 (600 CA) MG PO TABS: 1.0000 | ORAL_TABLET | Freq: Two times a day (BID) | ORAL | Status: DC

## 2011-02-25 NOTE — Progress Notes (Signed)
Subjective:    Patient ID: [name], female    DOB: 03-Jul-1961, 50 y.o.   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 No specific aggravating factors, momentary relief after cracking her neck. No numbness or tingling down her arms or shoulders. No muscle atrophy. Patient states pain persistent because unable to stop daily activities to allow for adequate rest. Still wearing cervical collar. Per chart review, seems that the pain has been ongoing since at least 2004, MR C-spine in 2009 showed Degenerative spurring primarily at C4-5 into the left lateral recess and left neural foramen (might affect left C5 nerve root). Slight spurring into the left lateral recess at C3-4 with slight left foraminal stenosis without discrete nerve root impingement. States she go the Vicodin Rx from Dr. Aldine Contes on 02/10/11, and has already used the 90 pills within the last 2 weeks.  Also, patient is seen by Dr. Patsy Lager for low back pain and hip pain, and he feels that given her hx of polysubstance abuse, chronic pain,long term narcotic medications should be cautiously used and that she needs to lose weight at all costs. Recently was increased on gabapentin, but pt does not want to take it.  No fevers, chills, facial muscle weakness, vision changes, numbness or weakness of shoulders.   2) Ca/ Vitamin D Rx - patient wants refills of ca and vit d today.  Review of Systems Per HPI.  Current Outpatient Medications Medication Sig  . ARIPiprazole (ABILIFY) 5 MG tablet Take 1 tablet (5 mg total) by mouth daily.  . DULoxetine (CYMBALTA) 60 MG capsule Take 2 capsules (120 mg total) by mouth daily.  Marland Kitchen gabapentin (NEURONTIN) 300 MG capsule  Take 2 capsules (600 mg total) by mouth 3 (three) times daily.  Marland Kitchen glipiZIDE (GLUCOTROL) 10 MG 24 hr tablet Take 1 tablet (10 mg total) by mouth daily.  Marland Kitchen glucose blood (TRUETRACK TEST) test strip Use to test blood sugar 2 times a day.    Marland Kitchen HYDROcodone-acetaminophen (VICODIN) 5-500 MG per tablet Take 1 tablet by mouth every 8 (eight) hours as needed for Pain. For pain.  . metFORMIN (GLUCOPHAGE) 1000 MG tablet Take 1 tablet (1,000 mg total) by mouth 2 (two) times daily with meals.  . metoprolol (LOPRESSOR) 25 MG tablet Take 0.5 tablets (12.5 mg total) by mouth 2 (two) times daily.  . mupirocin (BACTROBAN) 2 % ointment Apply topically 3 (three) times daily.  Marland Kitchen olmesartan (BENICAR) 20 MG tablet Take 1 tablet (20 mg total) by mouth daily.  . traMADol (ULTRAM) 50 MG tablet Take 1 tablet (50 mg total) by mouth every 6 (six) hours as needed for Pain. For pain.  . traZODone (DESYREL) 100 MG tablet Take 1 tablet (100 mg total) by mouth at bedtime.  . Calcium Carbonate (CALCARB 600) 1500 MG TABS Take 1 tablet (1,500 mg total) by mouth 2 (two) times daily.  . cholecalciferol (VITAMIN D-400) 400 UNITS TABS Take 1 tablet (400 Units total) by mouth daily.  Marland Kitchen FLUoxetine (PROZAC) 20 MG capsule Take 1 capsule (20 mg total) by mouth daily.  Marland Kitchen omeprazole (PRILOSEC) 20 MG capsule Take 1 capsule (20 mg total) by mouth daily.    Allergies Review of patient's allergies indicates no known allergies.  Past Medical History  Diagnosis Date  . Depression     secondary to loss of her son at age 47  . Diabetes mellitus   . Hypertension   . Low back pain   . Hep C w/o coma, chronic   . Insomnia   . Substance abuse     sober since 2001    Past Surgical History  Procedure Date  . Abdominal hysterectomy     at age 76, unknown reasons       Objective:   Physical Exam General: Vital signs reviewed and noted. Well-developed,well-nourished,in no acute distress; alert,appropriate and cooperative throughout  examination. Head: normocephalic, atraumatic. Neck: No deformities, masses. Tenderness to palpation over cervical spine, and bogginess of surrounding musculature. Normal FROM. Sensation and muscle strength intact BL.  Lungs: Normal respiratory effort. Clear to auscultation BL without crackles or wheezes.  Heart: RRR. S1 and S2 normal without gallop, murmur, or rubs.  Abdomen: BS normoactive. Soft, Nondistended, non-tender.  No masses or organomegaly. Extremities: No pretibial edema.    Assessment & Plan:  Case and plan of care discussed with Dr. Mariea Stable.

## 2011-02-25 NOTE — Patient Instructions (Signed)
1) Please follow-up at the clinic in 1 month with your Primary Care Provider, at which time we will reevaluate your pain. 2) Continue taking your Neurontin as prescribed. 3) Please bring all of your medications in a bag to your next visit. 4) If you are diabetic, please bring your meter to your next visit. 5) If symptoms worsen, or new symptoms arise, please call the clinic or go to the ER.

## 2011-02-26 ENCOUNTER — Encounter: Payer: Self-pay | Admitting: Internal Medicine

## 2011-02-26 ENCOUNTER — Telehealth: Payer: Self-pay | Admitting: *Deleted

## 2011-02-26 NOTE — Assessment & Plan Note (Addendum)
Cervical neck pain consistent with her chronic pain, no new neurologic symptoms appreciated. As suggested by Dr. Patsy Lager, given patient's history of substance abuse and chronic pain, careful management of narcotic meds should be pursued.  - Encouraged continuation of gabapentin - No narcotic refills given today as pt states she has used 90 pills of Vicodin within last 2 weeks - told only to take it as directed - Follow-up with PCP closely.

## 2011-02-26 NOTE — Telephone Encounter (Signed)
CALLED PATIENT AND LEFT VOICE MESSAGE FOR PATIENT TO CALL ME(Catrell Morrone) AT [facility] [phone].  THE REASON FOR THIS CALL , IS TO LET THE PATIENT KNOW (PER DR REYNOLDS) THAT MEDICAID WILL NOT PAY FOR VIT D / NOR WILL IT PAY FOR CALCIUM PILL. THIS IS AT ALL PHRAM.  SHE CAN BUY THEM OVER THE COUNTER.  Schawn Byas NTII

## 2011-03-03 ENCOUNTER — Ambulatory Visit: Payer: Medicaid Other | Admitting: Physical Therapy

## 2011-03-10 ENCOUNTER — Ambulatory Visit: Payer: Medicaid Other | Attending: Family Medicine | Admitting: Physical Therapy

## 2011-03-10 DIAGNOSIS — R269 Unspecified abnormalities of gait and mobility: Secondary | ICD-10-CM | POA: Insufficient documentation

## 2011-03-10 DIAGNOSIS — M25579 Pain in unspecified ankle and joints of unspecified foot: Secondary | ICD-10-CM | POA: Insufficient documentation

## 2011-03-10 DIAGNOSIS — IMO0001 Reserved for inherently not codable concepts without codable children: Secondary | ICD-10-CM | POA: Insufficient documentation

## 2011-03-10 DIAGNOSIS — M6281 Muscle weakness (generalized): Secondary | ICD-10-CM | POA: Insufficient documentation

## 2011-03-10 DIAGNOSIS — R5381 Other malaise: Secondary | ICD-10-CM | POA: Insufficient documentation

## 2011-03-11 ENCOUNTER — Telehealth: Payer: Self-pay | Admitting: Family Medicine

## 2011-03-11 ENCOUNTER — Telehealth: Payer: Self-pay | Admitting: Internal Medicine

## 2011-03-11 ENCOUNTER — Ambulatory Visit (INDEPENDENT_AMBULATORY_CARE_PROVIDER_SITE_OTHER): Payer: Medicaid Other | Admitting: Sports Medicine

## 2011-03-11 ENCOUNTER — Encounter: Payer: Self-pay | Admitting: Sports Medicine

## 2011-03-11 VITALS — BP 127/86 | HR 93 | Ht 71.0 in

## 2011-03-11 DIAGNOSIS — M722 Plantar fascial fibromatosis: Secondary | ICD-10-CM | POA: Insufficient documentation

## 2011-03-11 MED ORDER — HYDROCODONE-ACETAMINOPHEN 5-500 MG PO TABS: 1.0000 | ORAL_TABLET | Freq: Three times a day (TID) | ORAL | Status: DC | PRN

## 2011-03-11 NOTE — Telephone Encounter (Signed)
Pt called from pharmacy with question regarding vicodin script; states she was expecting to receive her 90 tab supply but was only given 20.  Previous note in EPIC reveals that pharmacy had not received script and would not be able to fill 90 tab supply until Monday, so pt was gicwen 20 tabs to provide her with sufficient meds over the weekend.  Pt reports significant difficulty with transportation and is unsure why she cannot obtain her normal rx now.  Discussed case with pharmacist, Maxine Glenn [phone]).  Pt recently transferred all scripts from [facility] to [facility].  [facility] has not received paperwork for transfer as the request for paperwork was made after hours and will not arrive at [name] until Monday; therefore rx for Vicodin could not be filled until Monday.  As a result of patients difficulty obtaining transportation, full rx for vicodin 5/500 one tab po q8 #90 will be filled; both the 20 tab rx called in earlier today and the rx for monthly 90 tab supply expected on Monday will be cancelled.

## 2011-03-11 NOTE — Assessment & Plan Note (Signed)
Will refer for surgical evaluation for possible surgical management. Likely worsening in setting of morbid obesity. Discontinue nitroglycerin patches. Continue current pain regimen. Follow up as below.

## 2011-03-11 NOTE — Progress Notes (Signed)
  Subjective:    Patient ID: [name], female    DOB: Apr 25, 1961, 50 y.o.   MRN: [id number]  HPI 1) Plantar fasciitis: Chronic problem. Minimal relief with insoles, arch bands. Some initial relief with injections but less so with subsequent treatments. No relief with nitroglycerin patches. Currently on gabapentin with some relief, also on Cymbalta. Has been on amitriptyline with little to no relief, has also been on NSAIDs, tramadol. She is interested in surgical evaluation. Pain is worse in right foot as compared to left. Ultrasound of bilateral feet in November 2011 demonstrated L foot with PF measuring 0.57cm, significant thickening noted throughout foot.  Small heel spur & calcifications at PF insertion.  R foot with PF measuring 0.40cm, also with thickening throughout foot.  Small heel spur.  No increased doppler flow.   Review of Systems     Objective:   Physical Exam General: Morbidly obese, pleasant, NAD    Musculoskeletal:   FEET:  Full ROM bilateral ankles with pain at insertion of plantar fascia with dorsiflexion bilaterally (but right more than left) Bilateral collapse of longitudinal arch with weight bearing. Significant transverse arch collapse bilaterally. Tender to palpation at insertion of plantar fascia bilaterally right much more than left. No nodules palpated.          Assessment & Plan:

## 2011-03-11 NOTE — Patient Instructions (Addendum)
It was great to see you today! Stop using the nitroglycerin patches since they are not working. Continue to wear the orthotics (shoe inserts) to help with pain. We will work on getting you set up with one of the surgeons who accepts Medicaid. You have an appointment with Dr. Thurston Hole on Tuesday April 10th at 10 AM. Their address is [address] and the phone number there is  [phone]. The office stated you have a previous balance with one of their sister offices that needs to be addressed before the appt on Tuesday. The number to call to handle the balance is [phone].

## 2011-03-11 NOTE — Telephone Encounter (Signed)
Patient's pharmacy called saying they could not fill her vicodin refill prescription as they misplaced her prescription but found it again later in the day and the pharmacy was called by that time. They could refill it on Monday but patient does not have any pain medication for next 3 days. They can however give her a small prescription that will last her until Monday. Prescribed 20 tabs of vicodin for now.

## 2011-03-12 LAB — GLUCOSE, CAPILLARY: Glucose-Capillary: 120 mg/dL — ABNORMAL HIGH (ref 70–99)

## 2011-03-15 ENCOUNTER — Other Ambulatory Visit: Payer: Self-pay | Admitting: *Deleted

## 2011-03-15 NOTE — Telephone Encounter (Signed)
refill 

## 2011-03-16 ENCOUNTER — Telehealth: Payer: Self-pay | Admitting: *Deleted

## 2011-03-16 NOTE — Telephone Encounter (Signed)
Pt left paperwork for diabetic shoes and assistance with health care.  It's in your box and she would like it signed ASAP and faxed back to Facility.  Also she wants tramadol Rx to be written for # 120 a month, she takes 4 times a  Day as written.

## 2011-03-17 LAB — GLUCOSE, CAPILLARY: Glucose-Capillary: 93 mg/dL (ref 70–99)

## 2011-03-20 MED ORDER — TRAMADOL HCL 50 MG PO TABS: 50.0000 mg | ORAL_TABLET | Freq: Four times a day (QID) | ORAL | Status: DC | PRN

## 2011-03-23 ENCOUNTER — Telehealth: Payer: Self-pay | Admitting: *Deleted

## 2011-03-23 ENCOUNTER — Ambulatory Visit: Payer: Medicaid Other | Admitting: Family Medicine

## 2011-03-23 ENCOUNTER — Ambulatory Visit: Payer: Medicaid Other | Admitting: Physical Therapy

## 2011-03-23 LAB — GLUCOSE, CAPILLARY: Glucose-Capillary: 140 mg/dL — ABNORMAL HIGH (ref 70–99)

## 2011-03-23 NOTE — Telephone Encounter (Signed)
OPC to make the referral to Dr Charlsie Merles.

## 2011-03-25 ENCOUNTER — Ambulatory Visit: Payer: Medicaid Other | Admitting: Physical Therapy

## 2011-03-26 ENCOUNTER — Other Ambulatory Visit: Payer: Self-pay | Admitting: *Deleted

## 2011-03-26 DIAGNOSIS — M722 Plantar fascial fibromatosis: Secondary | ICD-10-CM

## 2011-03-26 NOTE — Telephone Encounter (Signed)
Call from pt stating that she needed a refill on her medication.  Did not get a full prescription at the pharmacy last month.  Wants to get a refill on an unspecified medication.  Message left for pt to call the Clinics and to ask for the Triage nurse.

## 2011-03-26 NOTE — Telephone Encounter (Signed)
Pt returned the call.  Would like to get a refill on Tramadol if possible.

## 2011-03-30 ENCOUNTER — Ambulatory Visit: Payer: Medicaid Other | Admitting: Physical Therapy

## 2011-03-31 ENCOUNTER — Telehealth: Payer: Self-pay | Admitting: *Deleted

## 2011-03-31 MED ORDER — TRAMADOL HCL 50 MG PO TABS: 50.0000 mg | ORAL_TABLET | Freq: Four times a day (QID) | ORAL | Status: DC | PRN

## 2011-03-31 NOTE — Telephone Encounter (Signed)
Addended byMliss Sax on: 03/31/2011 02:42 PM   Modules accepted: Orders

## 2011-03-31 NOTE — Telephone Encounter (Signed)
SPOKE WITH [name] AND GAVE HER THE APPT INFO FOR DR REGAL / MAY 2, 012 AT 10:30AM / [facility]- [address]- [phone]. LETTER MAILED TO THE PATIENT.  PATIENT WAS INSTRUCTED TO CALL THE OFFICE IF THIS IS NOT A GOOD DATE/TIME FOR HER. LELA STURDIVANT NTII

## 2011-04-01 ENCOUNTER — Ambulatory Visit: Payer: Medicaid Other | Admitting: Physical Therapy

## 2011-04-05 ENCOUNTER — Other Ambulatory Visit: Payer: Self-pay | Admitting: Internal Medicine

## 2011-04-05 ENCOUNTER — Ambulatory Visit: Payer: Medicaid Other | Admitting: Rehabilitation

## 2011-04-05 ENCOUNTER — Other Ambulatory Visit: Payer: Self-pay | Admitting: *Deleted

## 2011-04-06 ENCOUNTER — Encounter: Payer: Self-pay | Admitting: Internal Medicine

## 2011-04-06 ENCOUNTER — Ambulatory Visit (INDEPENDENT_AMBULATORY_CARE_PROVIDER_SITE_OTHER): Payer: Medicaid Other | Admitting: Internal Medicine

## 2011-04-06 VITALS — BP 132/86 | HR 86 | Temp 97.1°F | Ht 71.0 in | Wt 326.4 lb

## 2011-04-06 DIAGNOSIS — M722 Plantar fascial fibromatosis: Secondary | ICD-10-CM

## 2011-04-06 DIAGNOSIS — I10 Essential (primary) hypertension: Secondary | ICD-10-CM

## 2011-04-06 DIAGNOSIS — L819 Disorder of pigmentation, unspecified: Secondary | ICD-10-CM

## 2011-04-06 DIAGNOSIS — E119 Type 2 diabetes mellitus without complications: Secondary | ICD-10-CM

## 2011-04-06 DIAGNOSIS — F3289 Other specified depressive episodes: Secondary | ICD-10-CM

## 2011-04-06 DIAGNOSIS — F329 Major depressive disorder, single episode, unspecified: Secondary | ICD-10-CM

## 2011-04-06 LAB — GLUCOSE, CAPILLARY: Glucose-Capillary: 89 mg/dL (ref 70–99)

## 2011-04-06 MED ORDER — HYDROCODONE-ACETAMINOPHEN 5-500 MG PO TABS: 1.0000 | ORAL_TABLET | Freq: Three times a day (TID) | ORAL | Status: DC | PRN

## 2011-04-06 MED ORDER — HYDROCODONE-ACETAMINOPHEN 7.5-500 MG PO TABS: 1.0000 | ORAL_TABLET | Freq: Three times a day (TID) | ORAL | Status: DC | PRN

## 2011-04-06 MED ORDER — METOPROLOL TARTRATE 25 MG PO TABS: 12.5000 mg | ORAL_TABLET | Freq: Two times a day (BID) | ORAL | Status: DC

## 2011-04-06 MED ORDER — METFORMIN HCL 1000 MG PO TABS: 1000.0000 mg | ORAL_TABLET | Freq: Two times a day (BID) | ORAL | Status: DC

## 2011-04-06 NOTE — Assessment & Plan Note (Signed)
First reading upon arrival to the clinic indicated high blood pressure however I haven't recheck blood pressure and it appears well-controlled on current medication regimen. I have encouraged patient to check her blood pressure regularly and to call us back if her numbers are persistently higher than 140/90. For now we'll continue same medication regimen and we'll not increase the dose of metoprolol at this time. If the numbers are persistently higher than that he needed goal we can increase the dose of metoprolol.

## 2011-04-06 NOTE — Assessment & Plan Note (Signed)
Patient does not appear to be depressed at today's visit, her mood appears to be stable with good judgment. Current medication regimen includes Abilify, trazodone, Wellbutrin and Cymbalta all of which are being prescribed by mental health department.

## 2011-04-06 NOTE — Progress Notes (Signed)
  Subjective:    Patient ID: [name], female    DOB: November 27, 1961, 50 y.o.   MRN: [id number]  HPI Patient is 50 year old female with medical history outlined below who presents to clinic for regular followup on blood pressure, diabetes, chronic pain. She reports medical compliance with BP and diabetic medication and reports that pain is well controlled on current medication regimen. She has new concern today of facial spots that she first noticed 2 weeks ago and has never had similar problem in the past. Facial spots look hyperpigmented compared to the rest of the skin. She has 3 spots on the forehead and would like to see a dermatologist for the problem. She denies recent illnesses or hospitalizations. She denies fevers and chills, no chest pain, no abdominal or urinary concerns. In terms of her chronic plantar fasciitis she seen Dr. Charlsie Merles first week of May. She also needs refill on her pain medication and her blood pressure medication.    Review of Systems Per history of present illness    Objective:   Physical Exam Constitutional: Vital signs reviewed.  Patient is a well-developed and well-nourished in no acute distress and cooperative with exam. Alert and oriented x3.  Cardiovascular: RRR, S1 normal, S2 normal, no MRG, pulses symmetric and intact bilaterally Pulmonary/Chest: CTAB, no wheezes, rales, or rhonchi Abdominal: Soft. Non-tender, non-distended, bowel sounds are normal, no masses, organomegaly, or guarding present.  Musculoskeletal: No joint deformities, erythema, or stiffness, ROM full and no nontender Skin: Warm, dry and intact. There are 3 hyperpigmented spots on forehead, round and approximately 1 cm in diameter, not raised and not bleeding, with regular borders Psychiatric: Normal mood and affect. speech and behavior is normal. Judgment and thought content normal. Cognition and memory are normal.          Assessment & Plan:

## 2011-04-06 NOTE — Assessment & Plan Note (Signed)
Patient is following with orthopedic surgeon Dr. Charlsie Merles and she has appointment scheduled first week of May. We will followup on recommendations.

## 2011-04-06 NOTE — Assessment & Plan Note (Signed)
Spots on the forehead appear as sun related skin damage. I agree with patient's request to see dermatologist. Will make referral today and will followup on recommendations. Patient was advised to avoid exposure to sun and to wear protective clothing with sunscreen lotion.

## 2011-04-06 NOTE — Patient Instructions (Signed)
Please schedule followup appointment in 3 months. Also checked her blood pressure regularly and if the numbers are higher than 140/90 please call us back so that we can readjust her current medications.

## 2011-04-06 NOTE — Assessment & Plan Note (Signed)
A1c today was 6.5. Diabetes is well-controlled. I have discussed with patient the need for Accu-Chek Kit. She does not want to check her sugar levels and I have agreed with her decision. I have discussed recommended diet and have also recommended to continue compliance with medications. If her diabetes becomes uncontrollable we can provide Accu-Chek Kit for her as well as additional diabetic supplies. Patient requested diabetic shoes and I have provided prescription for that. Patient is up-to-date on yearly eye examinations. I have also examined her feet today and the results of foot exam are within normal limits except thickened nails. We will continue same medication regimen with no changes in dosing at this time.

## 2011-04-07 ENCOUNTER — Other Ambulatory Visit: Payer: Self-pay | Admitting: Licensed Clinical Social Worker

## 2011-04-07 DIAGNOSIS — M1990 Unspecified osteoarthritis, unspecified site: Secondary | ICD-10-CM

## 2011-04-08 ENCOUNTER — Ambulatory Visit: Payer: Medicaid Other | Attending: Family Medicine | Admitting: Rehabilitation

## 2011-04-08 ENCOUNTER — Telehealth: Payer: Self-pay | Admitting: Licensed Clinical Social Worker

## 2011-04-08 DIAGNOSIS — R5381 Other malaise: Secondary | ICD-10-CM | POA: Insufficient documentation

## 2011-04-08 DIAGNOSIS — M6281 Muscle weakness (generalized): Secondary | ICD-10-CM | POA: Insufficient documentation

## 2011-04-08 DIAGNOSIS — R269 Unspecified abnormalities of gait and mobility: Secondary | ICD-10-CM | POA: Insufficient documentation

## 2011-04-08 DIAGNOSIS — IMO0001 Reserved for inherently not codable concepts without codable children: Secondary | ICD-10-CM | POA: Insufficient documentation

## 2011-04-08 DIAGNOSIS — M25579 Pain in unspecified ankle and joints of unspecified foot: Secondary | ICD-10-CM | POA: Insufficient documentation

## 2011-04-08 NOTE — Telephone Encounter (Signed)
Left message with patient to discuss plan of care [facility], inhome aide).

## 2011-04-12 NOTE — Telephone Encounter (Signed)
Patient called this AM and said she would like to have an aide for assist with ADL's.  Faxed order this AM for [employer] to go out and assess need.

## 2011-04-13 ENCOUNTER — Encounter: Payer: Self-pay | Admitting: Rehabilitation

## 2011-04-14 ENCOUNTER — Other Ambulatory Visit: Payer: Self-pay | Admitting: Internal Medicine

## 2011-04-15 ENCOUNTER — Encounter: Payer: Self-pay | Admitting: Rehabilitation

## 2011-04-20 ENCOUNTER — Encounter: Payer: Self-pay | Admitting: Rehabilitation

## 2011-04-20 NOTE — Consult Note (Signed)
NAMEANGELIK, WALLS           ACCOUNT NO.:  [id number]   MEDICAL RECORD NO.:  [id number]          PATIENT TYPE:  REC   LOCATION:  FOOT                         FACILITY:  [facility]   PHYSICIAN:  Theresia Majors. Tanda Rockers, M.D.DATE OF BIRTH:  1961/11/07   DATE OF CONSULTATION:  12/28/2007  DATE OF DISCHARGE:                                 CONSULTATION   REASON FOR CONSULTATION:  Melanie Openshaw is a 50 year old female  referred by Dr. Silvestre Mesi and Dr. Donnald Garre for evaluation of abdominal  wound and also a rectal area wound.   IMPRESSION:  1. 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  2. Second-degree abrasion in the gluteal area.   RECOMMENDATIONS:  1. Continuation of antiseptic soap wash b.i.d. with topical tri-      antibiotic ointment, 4x4 pad and loose garments.  2. Additional efforts to avoid fraction in these areas of recurrent      injury.  3. Continue under the care of the [facility] for management of      her diabetes and exogenous obesity.   SUBJECTIVE:  Mr. Sol is a 50 year old lady who has had a small  ulceration in the suprapubic area for the past 5 months.  She has been  followed at the outpatient clinic and has been treated with antifungal  agents, and creams and local hygiene.  In spite of these efforts, this  wound has enlarged.  The wound has not been associated with excessive  drainage or extreme malodor.  The wound is tender.  It is particularly  painful with motion with walking and turning in the bed. She denies  concurrent trauma.   PAST MEDICAL HISTORY:  1. Hypertension.  2. Obesity.  3. Hepatitis C.  4. Depression.  5. Substance abuse.   CURRENT MEDICATION LIST:  1. Naprosyn 500 mg b.i.d.  2. Glipizide 10 mg daily.  3. Benicar 20 mg daily.  4. Metformin 1000 mg b.i.d.  5. Lantus insulin 25 units h.s.  6. Ultram 50 mg b.i.d.  7. Lasix 40 mg daily.  8. Cymbalta 120 mg daily.  9. Catapres 0.1 mg daily.  10.Neurontin 400 mg t.i.d.  11.Trazodone 100 mg h.s.  12.Hydroxipam 50 mg every hours.  13.She takes over-the-counter calcium and vitamin D.   The patient denies drug allergies.   Previous surgery has included a hysterectomy, nasal surgery and open  reduction of a left wrist.   FAMILY HISTORY:  Positive for cancer and diabetes.  Positive for heart  attack.   SOCIAL:  She lives in [place].  She is an unemployed former Lawyer.  She  has two adult children, who live in the local area. She is not married.   REVIEW OF SYSTEMS:  The patient admits to polymyalgias and arthralgias,  which prohibit her from walking more than a block.  She cannot negotiate  a flight of stairs.  She is a reformed smoker of over 10 years.  She  reports a weight gain of over 50 pounds over the last year.  She has had  multiple changes in her diabetic medications  because of increased CBG's.  Her last fasting blood sugar was in excess of 200.  Normally she is  approximately 150-160.  She denies polydipsia but admits to polyphagia.  She denies incontinence.  She admits to anxiety and depression, for  which she is being evaluated by the mental health department.  She  specifically denies angina pectoris, chronic cough, hemoptysis or  hematochezia.  The remainder of her review of systems is negative.   PHYSICAL EXAM:  She is an alert, oriented female who is in good contact  with reality and responding appropriately to questioning.  Blood pressure is 129/73, respirations are 20, temperature is 98.2.  She  is unaccompanied.  She is examined in the presence of the nurse.  Her  capillary blood glucose is 219 mg%.  HEENT:  Clear.  NECK:  Supple.  Trachea is midline.  The thyroid is nonpalpable.  LUNGS:  Clear.  The heart sounds are distant.  ABDOMEN:  Soft.  There is a prominent panniculus extending over the  suprapubic area.  At the level of the pubis there is a partial-thickness  abrasion, chronically inflamed,  with a halo of epithelialization.  No  debridement is needed.  This wound is moist and is moderately tender to  probing with a Q-tip.  On the lateral areas of the folded panniculus are  superficial abrasions in the depths of the skin fold.  The patient was  examined from the left lateral position and in the superior portion of  the intergluteal crease are two punctate areas of abrasion.  There is  100% granulation on these wounds.  There is no peri-wound erythema.  There is no cellulitis or fluctuance.  There is no active infection.  Refer to the data base entries.  There is trace edema bilaterally.  The feet were not examined.   DISCUSSION:  Ms. Graig has superficial abrasions involving the lower  mid abdomen and the proximal intergluteal area.  We have addressed the  multifactorial etiology of these lesions including the possibility of  fungi, candidiasis and mechanical pressure due to her panniculus as well  as abrasions from her use of Turkish towels.  We have emphasized the  need for an aggressive weight reduction program and the need to continue  under her mental health [facility].  In addition, we have emphasized the  need to control her diabetes.   With regard to her wounds, we have instructed her in local hygiene,  identifying the major culprits of the suprapubic wound as that of  pressure caused from the panniculus. The suggest dressing technique was  demonstrated.  We have instructed her in hygiene and the use of a  sterile 4x4 in these areas to offset the direct pressure.  We have given  the patient an opportunity to ask questions.  She seems to understand  the explanations and the recommendations for  management.  She indicates that she is able to execute these  instructions and will be compliant.  She expresses gratitude for having  been seen in the clinic, indicates that she will be compliant.  We will  reevaluate the patient in 1 month p.r.n.      Harold A.  Tanda Rockers, M.D.  Electronically Signed     HAN/MEDQ  D:  12/28/2007  T:  12/28/2007  Job:  [id number]   cc:   Ronda Fairly, M.D.  Alvester Morin, M.D.  Yetta Barre, M.D.

## 2011-04-20 NOTE — Assessment & Plan Note (Signed)
[facility]   NAME:  [name], [name]           ACCOUNT NO.:  [id number]   MEDICAL RECORD NO.:  [id number]      DATE OF BIRTH:  Apr 12, 1961   PHYSICIAN:  Jake Shark A. Tanda Rockers, M.D. VISIT DATE:  01/25/2008                                   OFFICE VISIT   SUBJECTIVE:  [name] is a 50 year old lady who we are following  for some abrasions secondary to the pressure and torsion related to her  panniculus.  In the interim we have treated her with antiseptic soap  washes and dry pads.  She reports that there has been decrease pain and  less tenderness in these areas of previous abrasion.   OBJECTIVE:  Blood pressure is 123/82, respirations 18, pulse rate 100,  temperature 97.6.  Capillary blood glucose 132 mg percent.  Inspection of the lower midline abdominal lesion shows that there is  been significant contraction.  There is no sinus.  There is advancing  epithelium over a 100% granulated bed.  Wound #2 is completely resolved.   ASSESSMENT:  Clinical improvement of abrasive injuries.   PLAN:  We will continue antiseptic soap wash and pad protection.  We  will reevaluate the patient in 3 weeks with anticipation that this wound  should be completely resolved.      Harold A. Tanda Rockers, M.D.  Electronically Signed     HAN/MEDQ  D:  01/25/2008  T:  01/26/2008  Job:  [id number]

## 2011-04-22 ENCOUNTER — Encounter: Payer: Self-pay | Admitting: Rehabilitation

## 2011-04-23 NOTE — H&P (Signed)
   NAMEBELVIE, IRIBE                       ACCOUNT NO.:  [id number]   MEDICAL RECORD NO.:  [id number]                   PATIENT TYPE:  AMB   LOCATION:  DSC                                  FACILITY:  [facility]   PHYSICIAN:  Hermelinda Medicus, M.D.                DATE OF BIRTH:  March 17, 1961   DATE OF ADMISSION:  08/30/2003  DATE OF DISCHARGE:                                HISTORY & PHYSICAL   This patient is a 50 year old female who has traumatized her nose in the  past.  She cannot breathe through her nose.  She has a severe septal  deviation.  Essentially the septum is at right angles in the midportion  posterior quadrilateral cartilage, and she now enters for a septal  reconstruction and turbinate reduction.   Her past history is interesting in the fact that she has had previous  hysterectomy and also previous wrist surgery on the right wrist.   She is allergic to IBUPROFEN, causes heartburn and discomfort.   She takes Skelaxin 800 mg q.8h. and GFN Phenylephrine tablets daily.  Also,  she has been on prednisone 10 mg.   She has used cocaine in the past 4-1/2 years ago, no more.  She smokes about  three cigarettes a day.   She has no cardiac, neurological, bowel or bladder or kidney problems.  She  has had hepatitis B, however.   PHYSICAL EXAMINATION:  VITAL SIGNS:  Blood pressure of 137/95.  She is 6  feet.  She weighs 350 pounds.  HEENT:  Her ears are clear.  The tympanic membranes are clear.  Oral cavity  is clear.  The nose shows a severe right-angle septal deviation blocking her  nasal airway on the right and the left.  No salivary gland abnormalities.  Both teeth and gums are within normal limits.  NECK:  Free of any thyromegaly, cervical lymphadenopathy, or mass.  CHEST:  Clear.  No rales, rhonchi, or wheezes.  CARDIOVASCULAR:  No evidence of murmur or gallops.  ABDOMEN:  Obese.  EXTREMITIES:  Unremarkable except for her right wrist where she has had  surgery.   INITIAL DIAGNOSES:  1. Septal deviation.  2. Turbinate hypertrophy.  3. History of hepatitis B.  4. History of IBUPROFEN allergy.  5. Obesity.                                                Hermelinda Medicus, M.D.   JC/MEDQ  D:  08/30/2003  T:  08/31/2003  Job:  [id number]   cc:   Aggie Cosier, M.D.

## 2011-04-23 NOTE — Op Note (Signed)
[name], [name]                       ACCOUNT NO.:  [id number]   MEDICAL RECORD NO.:  [id number]                   PATIENT TYPE:  AMB   LOCATION:  DSC                                  FACILITY:  [facility]   PHYSICIAN:  Hermelinda Medicus, M.D.                DATE OF BIRTH:  16-Apr-1961   DATE OF PROCEDURE:  08/30/2003  DATE OF DISCHARGE:                                 OPERATIVE REPORT   PREOPERATIVE DIAGNOSIS:  Septal deviation, turbinate hypertrophy, nasal  obstruction.   POSTOPERATIVE DIAGNOSIS:  Septal deviation, turbinate hypertrophy, nasal  obstruction.   PROCEDURE:  Septal reconstruction, turbinate reduction.   SURGEON:  Hermelinda Medicus, M.D.   ANESTHESIA:  Local 1% Xylocaine with epinephrine, topical Lidocaine and  Afrin nasal spray. The anesthesia was local with MAC with Judie Petit,  M.D.   DESCRIPTION OF PROCEDURE:  The patient was placed in the supine position and  under local anesthesia was prepped and draped in the appropriate manner  using Hibiclens x3 and the usual head drape.   Once the local anesthesia was instilled, a hemitransfixion incision was made  on the right side of the septum, carried around the columella to the left  side, back along the quadrilateral cartilage which was grossly deviated onto  a right-angle, pushing against the side of the nose into the turbinates. We  took a strip of the quadrilateral cartilage posteriorly  and then a  considerable amount of ethmoid and vomerine septal deviation which was also  pushed to the left.   We reduced the turbinates by outfracturing using the butter knife and were  able to gain some space this way also. She did have some high ethmoid septal  deviation to the right, but we were quite conservative with this for fear  that she would be losing some nasal height here because of the severity of  this problem.   We therefore  were conservative there, but took the vomerine septal  deviation which was so  severe off the premaxillary crest, brought the  premaxillary crest to the midline and then once the septum was established  in the midline we began closure using 5-0 plain catgut and then a through-  and-through septal suture using 4-0 plain catgut x2. An intranasal dressing  was placed.   The patient tolerated the procedure very well. She was doing well  postoperatively. Follow up will be overnight because of her steroid use,  because she is totally alone and her weight. She will then be seen in 5  days, 10 days, 3 weeks, 6 weeks and 6 months.                                               Hermelinda Medicus, M.D.    JC/MEDQ  D:  08/30/2003  T:  08/31/2003  Job:  [id number]

## 2011-04-23 NOTE — Op Note (Signed)
[name], [name]                       ACCOUNT NO.:  [id number]   MEDICAL RECORD NO.:  [id number]                   PATIENT TYPE:  AMB   LOCATION:  DSC                                  FACILITY:  [facility]   PHYSICIAN:  Dionne Ano. [name], M.D.         DATE OF BIRTH:  25-Oct-1961   DATE OF PROCEDURE:  02/06/2004  DATE OF DISCHARGE:  02/06/2004                                 OPERATIVE REPORT   PREOPERATIVE DIAGNOSIS:  Right carpal tunnel syndrome.   POSTOPERATIVE DIAGNOSIS:  Right carpal tunnel syndrome.   PROCEDURE:  1. Right limited open carpal tunnel release.  2. Right median nerve/peripheral nerve block at the wrist performed for     anesthesia purposes for carpal tunnel release.   SURGEON:  Dionne Ano. Amanda Pea, M.D.   COMPLICATIONS:  None.   ANESTHESIA:  Peripheral nerve block with IV sedation keeping the patient  awake, alert, and oriented in the entire case.   TOURNIQUET TIME:  Less than 15 minutes.   ESTIMATED BLOOD LOSS:  Minimal.   INDICATIONS FOR PROCEDURE:  The patient is a 50 year old female who presents  for the above-mentioned diagnosis.  I have counseled her in regard to the  risks and benefits of surgery, including the risks of bleeding, infection,  anesthesia, damage to normal structures, and failure of surgery to  accomplish its intended goals of relieving symptoms and restoring function.  With this in mind she has decided to proceed.  All questions have been  encouraged and answered preoperatively.   OPERATIVE PROCEDURE IN DETAIL:  The patient was seen by myself and  anesthesia, taken to the operative suite, underwent smooth induction of  peripheral nerve block with Marcaine and lidocaine without epinephrine  mixture at the wrist level.  Once adequate wrist block was obtained  performed by myself at the wrist level with local anesthetic, she was  prepped and draped a second time with Betadine scrub and paint.  Once it was  done the right upper  extremity was elevated, the tourniquet was insufflated  to 250 mmHg.  An incision was made 1 cm at the distal edge of the transverse  carpal ligament and the dissection was carried down through skin with knife  blade.  Palmar fascia was incised.  Deep canal/dissection was accomplished  revealing a palmaris brevis muscle, which was released.  I then identified  the distal edge of the transverse carpal ligament, identified this clearly,  and released this with a knife blade under 4.0 loupe magnification.  Following this, distal to proximal dissection was carried out, releasing the  proximal leaflet of the transverse carpal ligament until adequate room is  available for canal preparatory device one, two, and three.  The canal  preparatory devices were placed with the patient awake, alert and oriented,  without patient discomfort problems or complicating features.  Following  this a security clip was placed directly midline just under the proximal  leading leaflet of the  transverse carpal ligament.  The obturator disengaged  nicely, indicating correct placement.  I then identified the security knife,  placed it in the security clip, correctly releasing the transverse carpal  ligament, removed the security clip, identified the canal.  It was  completely released in terms of the release of the transverse carpal  ligament.  The median nerve was intact.  There was no pain or problems  during this.  I then deflated the tourniquet, obtained hemostasis with  bipolar electrocautery, irrigated the wound copiously, and sutured the wound  closed with interrupted Prolene after hemostasis was secured.  The patient  had a sterile dressing applied.  She was taken to the recovery room in  stable condition.  All sponge, needle, and instrument counts were reported  as correct.  She was discharged home on Vicodin and Robaxin.  She will  elevate the hand, move fingers frequently, and notify me should any problems   occur.  Otherwise, I will see her in a week.                                               Dionne Ano. [name], M.D.    Nash Mantis  D:  02/06/2004  T:  02/08/2004  Job:  [id number]

## 2011-04-27 ENCOUNTER — Telehealth: Payer: Self-pay | Admitting: *Deleted

## 2011-04-27 ENCOUNTER — Encounter: Payer: Self-pay | Admitting: Physical Therapy

## 2011-04-27 NOTE — Telephone Encounter (Signed)
Pt calls and states 5/21 when she had a BM the stool was full of dark red bleeding, at that time she had rectal pain, was not constipated. Since then she has not had another episode of bleeding, denies pain but is very concerned something "bad" is going on, she would like an appt but not until Friday, she is scheduled by sharonB. For thurs at 1415 and is agreeable, she is cautioned that if this becomes worse or she becomes more concerned that she call 911or go to EDor urg care, she is agreeable w/ this plan.

## 2011-04-27 NOTE — Telephone Encounter (Signed)
I agree with plan, thank you for the notification

## 2011-04-29 ENCOUNTER — Encounter: Payer: Self-pay | Admitting: Gastroenterology

## 2011-04-29 ENCOUNTER — Encounter: Payer: Self-pay | Admitting: Rehabilitation

## 2011-04-29 ENCOUNTER — Ambulatory Visit (INDEPENDENT_AMBULATORY_CARE_PROVIDER_SITE_OTHER): Payer: Medicaid Other | Admitting: Internal Medicine

## 2011-04-29 ENCOUNTER — Encounter: Payer: Self-pay | Admitting: Internal Medicine

## 2011-04-29 VITALS — BP 137/102 | HR 104 | Temp 98.3°F | Ht 71.0 in | Wt 319.9 lb

## 2011-04-29 DIAGNOSIS — I10 Essential (primary) hypertension: Secondary | ICD-10-CM

## 2011-04-29 DIAGNOSIS — K921 Melena: Secondary | ICD-10-CM | POA: Insufficient documentation

## 2011-04-29 DIAGNOSIS — E119 Type 2 diabetes mellitus without complications: Secondary | ICD-10-CM

## 2011-04-29 LAB — GLUCOSE, CAPILLARY: Glucose-Capillary: 115 mg/dL — ABNORMAL HIGH (ref 70–99)

## 2011-04-29 MED ORDER — OMEPRAZOLE 20 MG PO CPDR: 20.0000 mg | DELAYED_RELEASE_CAPSULE | Freq: Every day | ORAL | Status: DC

## 2011-04-29 MED ORDER — MUPIROCIN 2 % EX OINT: TOPICAL_OINTMENT | Freq: Three times a day (TID) | CUTANEOUS | Status: DC

## 2011-04-29 MED ORDER — CHOLECALCIFEROL 10 MCG (400 UNIT) PO TABS: 400.0000 [IU] | ORAL_TABLET | Freq: Every day | ORAL | Status: DC

## 2011-04-29 MED ORDER — GABAPENTIN 300 MG PO CAPS: 300.0000 mg | ORAL_CAPSULE | Freq: Three times a day (TID) | ORAL | Status: DC

## 2011-04-29 MED ORDER — CALCIUM CARBONATE 1500 (600 CA) MG PO TABS: 1.0000 | ORAL_TABLET | Freq: Two times a day (BID) | ORAL | Status: DC

## 2011-04-29 MED ORDER — FLUOXETINE HCL 20 MG PO CAPS: 20.0000 mg | ORAL_CAPSULE | Freq: Every day | ORAL | Status: DC

## 2011-04-29 MED ORDER — OLMESARTAN MEDOXOMIL 20 MG PO TABS: 20.0000 mg | ORAL_TABLET | Freq: Every day | ORAL | Status: DC

## 2011-04-29 MED ORDER — METFORMIN HCL 1000 MG PO TABS: 1000.0000 mg | ORAL_TABLET | Freq: Two times a day (BID) | ORAL | Status: DC

## 2011-04-29 NOTE — Progress Notes (Signed)
  Subjective:    Patient ID: [name], female    DOB: Apr 26, 1961, 50 y.o.   MRN: [id number]  HPI [name] is a pleasant 59 your old woman with past with a history of DM 2, peripheral neuropathy, depression who comes the clinic with bright red blood in stool 3 days ago. She said that this was the first ever incidence when she noticed blood in the stool and it was dark to bright red. The bowel movement yesterday, which was the next one after the bloody bowel movement, was normal brown color without any blood. She denies any abdominal pain, recent weight loss, fever, night sweats. She denies any diarrhea, constipation, nausea, vomiting, history of peptic of the disease. She does endorse having acid reflux and heartburn from that and she is on Prilosec. She had a colonoscopy done in September 2011 which showed--     1) 3 - 6 mm Four polyps in the ascending colon     2) 4 - 5 mm Two polyps in the sigmoid colon     3) Internal hemorrhoids And she is to have repeat colonoscopy in 5 years from then.    Review of Systems    as per history of present illness Objective:   Physical Exam    Constitutional: Vital signs reviewed.  Patient is a well-developed and well-nourished in no acute distress and cooperative with exam. Alert and oriented x3.  Head: Normocephalic and atraumatic Mouth: no erythema or exudates, MMM Eyes: PERRL, EOMI, conjunctivae normal, No scleral icterus.  Neck: Supple, Trachea midline normal ROM, No JVD, mass, thyromegaly, or carotid bruit present.  Cardiovascular: RRR, S1 normal, S2 normal Pulmonary/Chest: CTAB, no wheezes, rales, or rhonchi Abdominal: Soft. Non-tender, non-distended, bowel sounds are normal, no masses, organomegaly, or guarding present.  GU: no CVA tenderness Musculoskeletal: No joint deformities, erythema, or stiffness, ROM full and no nontender Neurological: A&O x3, Strenght is normal and symmetric bilaterally, cranial nerve II-XII are grossly  intact, no focal motor deficit, sensory intact to light touch bilaterally.  Skin: Warm, dry and intact. No rash, cyanosis, or clubbing.      Assessment & Plan:

## 2011-04-29 NOTE — Patient Instructions (Signed)
Please make a f/u apointment in 2-3 months. Please check stool with Hemoccult strips for any blood in stool. If you notice any blood in stool again, call the clinic and make an appointment.

## 2011-04-29 NOTE — Assessment & Plan Note (Signed)
[name] had one episode of bloody bowel movement with before and she has history of internal hemorrhoids and 6 polyp removal per colonoscopy last year. Considering this she likely had pain due to her hemorrhoids but considering her adenomatous polyps removal in September 11, will make a follow appointment with GI.

## 2011-04-29 NOTE — Assessment & Plan Note (Signed)
Blood pressure 137/102 today. I will continue the same medications for now. Refilled all the other medications for her today.

## 2011-04-30 ENCOUNTER — Other Ambulatory Visit: Payer: Self-pay | Admitting: Internal Medicine

## 2011-04-30 ENCOUNTER — Other Ambulatory Visit: Payer: Self-pay | Admitting: *Deleted

## 2011-04-30 DIAGNOSIS — M722 Plantar fascial fibromatosis: Secondary | ICD-10-CM

## 2011-04-30 NOTE — Telephone Encounter (Signed)
Pt said that she should be on the plain Hydrocodone 7.5 mg.  Has an ulcer that prevents her from taking Tylenol.  Has a prescription at another Pharnacy for the Lortab.  Wants this prescription sent to a different Pharmacy

## 2011-04-30 NOTE — Telephone Encounter (Signed)
Please communicate with the primary care for this problem of pain medication, as she already has refill of Vicodin and she is going to 2 different drugstores. Needs to be addressed by primary care as I just saw the patient for acute visit yesterday.

## 2011-05-04 ENCOUNTER — Encounter: Payer: Self-pay | Admitting: Rehabilitation

## 2011-05-04 ENCOUNTER — Telehealth: Payer: Self-pay | Admitting: *Deleted

## 2011-05-04 MED ORDER — HYDROCODONE-ACETAMINOPHEN 7.5-500 MG PO TABS: 1.0000 | ORAL_TABLET | Freq: Two times a day (BID) | ORAL | Status: DC

## 2011-05-04 NOTE — Telephone Encounter (Signed)
RTC to pt at [phone].  Child answered stating that pt was not there.  Pt was called at her home phone number.  Message left for the pt to call the Clinics.  Call was to inform pt that she will need to continue on her present Lortab.  Pt had come by the Clinics late last Friday stating that she had come to pick up het prescription.  Pt was informed at that time that Dr. Aldine Contes had not written a new prescription for pain medication for her.

## 2011-05-06 ENCOUNTER — Encounter: Payer: Self-pay | Admitting: Rehabilitation

## 2011-05-27 ENCOUNTER — Telehealth: Payer: Self-pay | Admitting: *Deleted

## 2011-05-27 NOTE — Telephone Encounter (Signed)
Called for prior authorization on Benicar - need to try Diovan or Losartan first. Flag sent to Dr Allena Katz about med. Stanton Kidney Joeann Steppe RN  05/27/11 11:30AM

## 2011-06-14 ENCOUNTER — Encounter: Payer: Self-pay | Admitting: Gastroenterology

## 2011-06-14 ENCOUNTER — Ambulatory Visit (INDEPENDENT_AMBULATORY_CARE_PROVIDER_SITE_OTHER): Payer: Medicaid Other | Admitting: Gastroenterology

## 2011-06-14 VITALS — BP 128/78 | HR 80 | Ht 71.0 in | Wt 309.0 lb

## 2011-06-14 DIAGNOSIS — Z86010 Personal history of colonic polyps: Secondary | ICD-10-CM

## 2011-06-14 DIAGNOSIS — K921 Melena: Secondary | ICD-10-CM

## 2011-06-14 NOTE — Progress Notes (Signed)
History of Present Illness: This is a 50 year old female who had one episode of dark red blood per rectum in May. She was seen by her primary physician and referred for further evaluation. She's not had any bleeding since that time. She underwent colonoscopy in September 2011 small adenomatous colon polyps and internal hemorrhoids. She relates no change in bowel habits change in stool caliber weight loss abdominal pain rectal pain nausea or vomiting.  Current Medications, Allergies, Past Medical History, Past Surgical History, Family History and Social History were reviewed in [employer] record.  Physical Exam: General: Well developed , well nourished, no acute distress Head: Normocephalic and atraumatic Eyes:  sclerae anicteric, EOMI Ears: Normal auditory acuity Mouth: No deformity or lesions Lungs: Clear throughout to auscultation Heart: Regular rate and rhythm; no murmurs, rubs or bruits Abdomen: Soft, non tender and non distended. No masses, hepatosplenomegaly or hernias noted. Normal Bowel sounds Rectal: No lesions and Hemoccult negative brown stool in the vault Musculoskeletal: Symmetrical with no gross deformities  Pulses:  Normal pulses noted Extremities: No clubbing, cyanosis, edema or deformities noted Neurological: Alert oriented x 4, grossly nonfocal Psychological:  Alert and cooperative. Normal mood and affect  Assessment and Recommendations:  1. Hematochezia-self-limited. This is likely a hemorrhoidal bleed. Her bleeding has not recurred. Followup as needed for recurrent GI symptoms.  2. Personal history of adenomatous colon polyps. Surveillance colonoscopy recommended September 2016.

## 2011-06-14 NOTE — Patient Instructions (Signed)
cc: Lyn Hollingshead, MD

## 2011-06-17 ENCOUNTER — Encounter: Payer: Self-pay | Admitting: Internal Medicine

## 2011-06-18 ENCOUNTER — Other Ambulatory Visit: Payer: Self-pay | Admitting: *Deleted

## 2011-06-18 MED ORDER — GLIPIZIDE ER 10 MG PO TB24: 10.0000 mg | ORAL_TABLET | Freq: Every day | ORAL | Status: DC

## 2011-06-18 NOTE — Telephone Encounter (Signed)
Rx called in 

## 2011-06-21 ENCOUNTER — Other Ambulatory Visit: Payer: Self-pay | Admitting: Dietician

## 2011-06-21 MED ORDER — GLUCOSE BLOOD VI STRP: ORAL_STRIP | Status: DC

## 2011-07-09 ENCOUNTER — Telehealth: Payer: Self-pay | Admitting: *Deleted

## 2011-07-09 NOTE — Telephone Encounter (Signed)
Pharm calls to state that pt is getting gabapentin from a spencer copeland and dr Aldine Contes. They state that on 06/12/2011 pt had script from dr copeland and dr Aldine Contes filled at [facility] getting a total of 360 gabapentin... 270 from dr copeland and 90 from int med. Pt is also getting trazadone from a dr holland.

## 2011-07-14 ENCOUNTER — Ambulatory Visit (INDEPENDENT_AMBULATORY_CARE_PROVIDER_SITE_OTHER): Payer: Medicaid Other | Admitting: Internal Medicine

## 2011-07-14 ENCOUNTER — Encounter: Payer: Self-pay | Admitting: Internal Medicine

## 2011-07-14 VITALS — BP 123/88 | HR 50 | Temp 97.7°F | Ht 71.0 in | Wt 309.5 lb

## 2011-07-14 DIAGNOSIS — I10 Essential (primary) hypertension: Secondary | ICD-10-CM

## 2011-07-14 DIAGNOSIS — G894 Chronic pain syndrome: Secondary | ICD-10-CM

## 2011-07-14 DIAGNOSIS — E119 Type 2 diabetes mellitus without complications: Secondary | ICD-10-CM

## 2011-07-14 LAB — POCT GLYCOSYLATED HEMOGLOBIN (HGB A1C): Hemoglobin A1C: 6.1

## 2011-07-14 NOTE — Assessment & Plan Note (Signed)
Patient has chronic pain and is requesting fentanyl patch. I have discussed this with patient given her history of substance abuse and I have recommended referral to pain clinic for further management.

## 2011-07-14 NOTE — Progress Notes (Signed)
  Subjective:    Patient ID: [name], female    DOB: Apr 08, 1961, 50 y.o.   MRN: [id number]  HPI  Patient is 50 year old female with past medical history outlined below who presents to clinic for regular followup on her diabetes and blood pressure. She would also like to discuss pain medication for her chronic pain. She's currently on Lortab, tramadol, muscle relaxant and this is offering no relief to her. She denies recent sicknesses or hospitalizations, no episodes of chest pain or shortness of breath, no abdominal or urinary concerns. She lives at home with son who is schizophrenic and is helping him with his activities of daily living. She is able to get around on her own.  Review of Systems Per HPI    Objective:   Physical Exam  Constitutional: Vital signs reviewed.  Patient is a well-developed and well-nourished in no acute distress and cooperative with exam. Alert and oriented x3.  Cardiovascular: RRR, S1 normal, S2 normal, no MRG, pulses symmetric and intact bilaterally Pulmonary/Chest: CTAB, no wheezes, rales, or rhonchi Abdominal: Soft. Non-tender, non-distended, bowel sounds are normal, no masses, organomegaly, or guarding present.  Neurological: A&O x3, Strenght is normal and symmetric bilaterally, cranial nerve II-XII are grossly intact, no focal motor deficit, sensory intact to light touch bilaterally.  Skin: Warm, dry and intact. No rash, cyanosis, or clubbing.  Psychiatric: Normal mood and affect. speech and behavior is normal. Judgment and thought content normal. Cognition and memory are normal.         Assessment & Plan:

## 2011-07-14 NOTE — Assessment & Plan Note (Signed)
Well controlled on current medication regimen we will check electrolyte panel today. No changes will be made today.

## 2011-07-14 NOTE — Assessment & Plan Note (Signed)
Diabetes is well controlled on current medication regimen. I have advised patient to bring her monitor next visit. We will see her in 3 months.

## 2011-08-04 ENCOUNTER — Encounter: Payer: Medicaid Other | Admitting: Physical Medicine and Rehabilitation

## 2011-08-04 ENCOUNTER — Other Ambulatory Visit: Payer: Self-pay | Admitting: Internal Medicine

## 2011-08-04 NOTE — Telephone Encounter (Signed)
Dr Aldine Contes noted earlier this month that the opioid was not providing any pain relief. Also got what I think was a four month supply on 05/04/2011 so should have one refill left. Has appt with pain clinic 9/26 so that fill (and refills) should last until that appt. Will CC Dr Aldine Contes who can override this denial and give enough for one month until pain clinic appt if medically indicated.

## 2011-08-06 ENCOUNTER — Other Ambulatory Visit: Payer: Self-pay | Admitting: Internal Medicine

## 2011-08-06 DIAGNOSIS — M722 Plantar fascial fibromatosis: Secondary | ICD-10-CM

## 2011-08-06 MED ORDER — HYDROCODONE-ACETAMINOPHEN 7.5-500 MG PO TABS: 1.0000 | ORAL_TABLET | Freq: Three times a day (TID) | ORAL | Status: DC

## 2011-08-06 NOTE — Telephone Encounter (Signed)
Patient called for refill of Lortab. She noted that she was taking 3 tablets a day  And no more at this point. I called the pharmacy and they informed me that she was prescribed 3 tablets per day with 90 tablets supply and she was due for a refill. I refill the medication and gave her a 90 day supply. She will follow up with Pain Clinic on 09/26.

## 2011-08-06 NOTE — Telephone Encounter (Signed)
Last filled 8/1

## 2011-08-13 ENCOUNTER — Encounter
Payer: Medicaid Other | Attending: Physical Medicine and Rehabilitation | Admitting: Physical Medicine and Rehabilitation

## 2011-08-13 DIAGNOSIS — I10 Essential (primary) hypertension: Secondary | ICD-10-CM | POA: Insufficient documentation

## 2011-08-13 DIAGNOSIS — M542 Cervicalgia: Secondary | ICD-10-CM

## 2011-08-13 DIAGNOSIS — E1149 Type 2 diabetes mellitus with other diabetic neurological complication: Secondary | ICD-10-CM | POA: Insufficient documentation

## 2011-08-13 DIAGNOSIS — M25569 Pain in unspecified knee: Secondary | ICD-10-CM

## 2011-08-13 DIAGNOSIS — M545 Low back pain, unspecified: Secondary | ICD-10-CM

## 2011-08-13 DIAGNOSIS — E1142 Type 2 diabetes mellitus with diabetic polyneuropathy: Secondary | ICD-10-CM | POA: Insufficient documentation

## 2011-08-13 DIAGNOSIS — M546 Pain in thoracic spine: Secondary | ICD-10-CM | POA: Insufficient documentation

## 2011-08-13 DIAGNOSIS — M76899 Other specified enthesopathies of unspecified lower limb, excluding foot: Secondary | ICD-10-CM | POA: Insufficient documentation

## 2011-08-13 DIAGNOSIS — E669 Obesity, unspecified: Secondary | ICD-10-CM | POA: Insufficient documentation

## 2011-08-13 DIAGNOSIS — R209 Unspecified disturbances of skin sensation: Secondary | ICD-10-CM | POA: Insufficient documentation

## 2011-08-13 DIAGNOSIS — F17200 Nicotine dependence, unspecified, uncomplicated: Secondary | ICD-10-CM | POA: Insufficient documentation
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
## 2011-08-16 ENCOUNTER — Telehealth: Payer: Self-pay | Admitting: *Deleted

## 2011-08-16 NOTE — Telephone Encounter (Signed)
Pt calls and wants referral for hand surgery, L hand. Also ask about getting narcotics, informed she would need to speak w/ dr wildman-tobriner at the appt., appt given for 9/18 at 1330

## 2011-08-24 ENCOUNTER — Other Ambulatory Visit: Payer: Self-pay | Admitting: Physical Medicine and Rehabilitation

## 2011-08-24 ENCOUNTER — Ambulatory Visit (INDEPENDENT_AMBULATORY_CARE_PROVIDER_SITE_OTHER): Payer: Medicaid Other | Admitting: Internal Medicine

## 2011-08-24 ENCOUNTER — Ambulatory Visit (HOSPITAL_COMMUNITY)
Admission: RE | Admit: 2011-08-24 | Discharge: 2011-08-24 | Disposition: A | Discharge status: Home / Self Care | Payer: Medicaid Other | Source: Ambulatory Visit | Attending: Physical Medicine and Rehabilitation | Admitting: Physical Medicine and Rehabilitation

## 2011-08-24 ENCOUNTER — Encounter: Payer: Self-pay | Admitting: Internal Medicine

## 2011-08-24 DIAGNOSIS — M79609 Pain in unspecified limb: Secondary | ICD-10-CM

## 2011-08-24 DIAGNOSIS — M1710 Unilateral primary osteoarthritis, unspecified knee: Secondary | ICD-10-CM | POA: Insufficient documentation

## 2011-08-24 DIAGNOSIS — M25561 Pain in right knee: Secondary | ICD-10-CM

## 2011-08-24 DIAGNOSIS — M25562 Pain in left knee: Secondary | ICD-10-CM

## 2011-08-24 DIAGNOSIS — G8929 Other chronic pain: Secondary | ICD-10-CM

## 2011-08-24 DIAGNOSIS — G894 Chronic pain syndrome: Secondary | ICD-10-CM

## 2011-08-24 DIAGNOSIS — G5602 Carpal tunnel syndrome, left upper limb: Secondary | ICD-10-CM | POA: Insufficient documentation

## 2011-08-24 DIAGNOSIS — E119 Type 2 diabetes mellitus without complications: Secondary | ICD-10-CM

## 2011-08-24 DIAGNOSIS — M79642 Pain in left hand: Secondary | ICD-10-CM

## 2011-08-24 DIAGNOSIS — F3289 Other specified depressive episodes: Secondary | ICD-10-CM

## 2011-08-24 DIAGNOSIS — M722 Plantar fascial fibromatosis: Secondary | ICD-10-CM

## 2011-08-24 DIAGNOSIS — F329 Major depressive disorder, single episode, unspecified: Secondary | ICD-10-CM

## 2011-08-24 DIAGNOSIS — M25569 Pain in unspecified knee: Secondary | ICD-10-CM | POA: Insufficient documentation

## 2011-08-24 DIAGNOSIS — IMO0002 Reserved for concepts with insufficient information to code with codable children: Secondary | ICD-10-CM | POA: Insufficient documentation

## 2011-08-24 LAB — GLUCOSE, CAPILLARY
Glucose-Capillary: 52 mg/dL — ABNORMAL LOW (ref 70–99)
Glucose-Capillary: 52 mg/dL — ABNORMAL LOW (ref 70–99)
Glucose-Capillary: 56 mg/dL — ABNORMAL LOW (ref 70–99)
Glucose-Capillary: 67 mg/dL — ABNORMAL LOW (ref 70–99)

## 2011-08-24 MED ORDER — GLUCOSE BLOOD VI STRP: ORAL_STRIP | Status: DC

## 2011-08-24 MED ORDER — ACCU-CHEK NANO SMARTVIEW W/DEVICE KIT: 1.0000 | PACK | Freq: Once | Status: DC

## 2011-08-24 MED ORDER — OLMESARTAN MEDOXOMIL 20 MG PO TABS: 20.0000 mg | ORAL_TABLET | Freq: Every day | ORAL | Status: DC

## 2011-08-24 MED ORDER — HYDROCODONE-ACETAMINOPHEN 7.5-500 MG PO TABS: 1.0000 | ORAL_TABLET | Freq: Three times a day (TID) | ORAL | Status: DC

## 2011-08-24 MED ORDER — ACCU-CHEK FASTCLIX LANCETS MISC: 1.0000 | Freq: Every day | Status: DC

## 2011-08-24 MED ORDER — FLUOXETINE HCL 20 MG PO CAPS: 20.0000 mg | ORAL_CAPSULE | Freq: Every day | ORAL | Status: DC

## 2011-08-24 MED ORDER — TRAMADOL HCL 50 MG PO TABS: 50.0000 mg | ORAL_TABLET | Freq: Four times a day (QID) | ORAL | Status: DC | PRN

## 2011-08-24 NOTE — Assessment & Plan Note (Signed)
Was seen in the pain clinic on 08/13/2011. As per history of present illness, waiting for MRI and x-rays and probable electrodiagnostic studies. She is on Vicodin 7.5 one tablet 3 times a day which helps her. I will give her the prescription for 90 tablets of Vicodin which cannot be refilled before 09/04/2011 because her last refill was done on 08/06/2011. I will also refill her tramadol today. Encouraged her to followup with the imaging results with the pain clinic and then will follow their final recommendations. She was supposed to get bilateral knee x-rays today.

## 2011-08-24 NOTE — Assessment & Plan Note (Signed)
Patient has history of carpal tunnel syndrome on right hand- for which she had surgery more than 2 years before. She says that she has in her symptoms in her left hand with sensation changes in fingers and weakness and change in grip. She  called the doctor's office who did the surgery on her right hand and she was told to get a referral from our clinic. We will call the clinic and try to set appointment for her for further evaluation and management

## 2011-08-24 NOTE — Assessment & Plan Note (Signed)
No suicidal ideation. Feels at baseline. I will refill fluoxetine.

## 2011-08-24 NOTE — Patient Instructions (Signed)
Please make followup appointment with your primary care doctor in 6-8 weeks or at the earliest possible appointment. Please stop taking glipizide from today. But continue taking metformin. I will give the refill for Vicodin today but that cannot be filled until September 29. Also get the x-rays and imaging procedures done as per the pain clinic Doctors recommendations. Take all medications regularly. We will make a referral for you for hand surgery.

## 2011-08-24 NOTE — Progress Notes (Signed)
2PM - container OJ given to pt due to CBG. Stanton Kidney Dallana Mavity RN 08/24/11 2:15PM

## 2011-08-24 NOTE — Assessment & Plan Note (Signed)
HbA1c 6.1. Being hypoglycemic with 50s and 60s lately. She saw Jamison Neighbor before I saw her, and as per recommendation I would stop glipizide and continue her metformin only. Discussed with her and told to stop glipizide from today. She verbalized understanding. She'll see her primary care doctor in 8-10 weeks.

## 2011-08-24 NOTE — Progress Notes (Signed)
  Subjective:    Patient ID: [name], female    DOB: 13-Oct-1961, 50 y.o.   MRN: [id number]  HPI [name] is a pleasant 36 your old woman with past with history of chronic pain syndrome, diabetes type 2 who comes to the clinic for medication refills and pain management. She was referred to pain management clinic in the last visit in August 2012. She was seen by the pain  Clinic on 08/13/2011 She is planned to get thoracic MRI, bilateral knee x-ray with sunrise view, and also considered to have elective diagnostic studies of bilateral lower extremities and bilateral trochanter injections. The did not fill her narcotic prescription. She is going to get the knee x-rays today. She says that her pain is well controlled with Vicodin 7.5 one tablet 3 times a day. She also takes tramadol for pain along with gabapentin. She denies any fever, chills, nausea vomiting, headache, chest pain, shortness of breath, abdominal pain, diarrhea.  She does complain of left hand pain all day long along with decreased sensation in the fingers and decreased strength with weak grip. She had carpal tunnel release on right side about 2 years before and she says that she has the same issues in left now. Reviewing her CBG's - today 58 and has been low in the 50s lately. Per Lupita Leash Riley's recommendation, we will stop glipizide today.  Review of Systems    as per history of present illness, other systems reviewed and negative. Objective:   Physical Exam  Vitals: Reviewed and stable  General: NAD, had a cervical collar placed. HEENT: PERRL, EOMI, no scleral icterus Cardiac: RRR, no rubs, murmurs or gallops Pulm: clear to auscultation bilaterally, moving normal volumes of air Abd: soft, nontender, nondistended, BS present Ext: warm and well perfused, no pedal edema Neuro: alert and oriented X3, cranial nerves II-XII grossly intact, decreased sensations below knee bilaterally. No motor abnormalities.       Assessment & Plan:

## 2011-08-27 ENCOUNTER — Other Ambulatory Visit: Payer: Self-pay | Admitting: Physical Medicine and Rehabilitation

## 2011-08-27 DIAGNOSIS — M546 Pain in thoracic spine: Secondary | ICD-10-CM

## 2011-08-30 ENCOUNTER — Ambulatory Visit
Admission: RE | Admit: 2011-08-30 | Discharge: 2011-08-30 | Disposition: A | Discharge status: Home / Self Care | Payer: Medicaid Other | Source: Ambulatory Visit | Attending: Physical Medicine and Rehabilitation | Admitting: Physical Medicine and Rehabilitation

## 2011-08-30 DIAGNOSIS — M546 Pain in thoracic spine: Secondary | ICD-10-CM

## 2011-09-01 ENCOUNTER — Other Ambulatory Visit: Payer: Self-pay | Admitting: Internal Medicine

## 2011-09-01 ENCOUNTER — Ambulatory Visit: Payer: Medicaid Other | Admitting: Physical Medicine and Rehabilitation

## 2011-09-01 DIAGNOSIS — Z1231 Encounter for screening mammogram for malignant neoplasm of breast: Secondary | ICD-10-CM

## 2011-09-10 ENCOUNTER — Encounter: Payer: Medicaid Other | Admitting: Physical Medicine and Rehabilitation

## 2011-09-15 ENCOUNTER — Ambulatory Visit (HOSPITAL_COMMUNITY)
Admission: RE | Admit: 2011-09-15 | Discharge: 2011-09-15 | Disposition: A | Discharge status: Home / Self Care | Payer: Medicaid Other | Source: Ambulatory Visit | Attending: Internal Medicine | Admitting: Internal Medicine

## 2011-09-15 DIAGNOSIS — Z1231 Encounter for screening mammogram for malignant neoplasm of breast: Secondary | ICD-10-CM | POA: Insufficient documentation

## 2011-09-15 LAB — CBC
RBC: 4.35
WBC: 8.4

## 2011-09-15 LAB — PROTIME-INR
INR: 0.9
Prothrombin Time: 12.1

## 2011-09-15 LAB — WOUND CULTURE

## 2011-09-21 ENCOUNTER — Telehealth: Payer: Self-pay | Admitting: *Deleted

## 2011-09-21 NOTE — Telephone Encounter (Addendum)
RTC to pt regarding referral to hand surgeon for Carpal Tunnel.  Call to [facility].  Pt scheduled with Dr. Cliffton Asters for 11/08/2011 at 9:00 AM to arrive by 8:30 AM.  Pt was informed.  Angelina Ok, RN 09/21/2011 4:56 PM

## 2011-09-29 ENCOUNTER — Ambulatory Visit (INDEPENDENT_AMBULATORY_CARE_PROVIDER_SITE_OTHER): Payer: Medicaid Other | Admitting: Internal Medicine

## 2011-09-29 ENCOUNTER — Encounter: Payer: Self-pay | Admitting: Internal Medicine

## 2011-09-29 ENCOUNTER — Other Ambulatory Visit: Payer: Self-pay | Admitting: Internal Medicine

## 2011-09-29 VITALS — BP 163/83 | HR 51 | Temp 97.5°F | Ht 71.0 in | Wt 295.7 lb

## 2011-09-29 DIAGNOSIS — Z23 Encounter for immunization: Secondary | ICD-10-CM

## 2011-09-29 DIAGNOSIS — M722 Plantar fascial fibromatosis: Secondary | ICD-10-CM

## 2011-09-29 DIAGNOSIS — E119 Type 2 diabetes mellitus without complications: Secondary | ICD-10-CM

## 2011-09-29 DIAGNOSIS — Z599 Problem related to housing and economic circumstances, unspecified: Secondary | ICD-10-CM

## 2011-09-29 DIAGNOSIS — Z5989 Other problems related to housing and economic circumstances: Secondary | ICD-10-CM

## 2011-09-29 DIAGNOSIS — Z598 Other problems related to housing and economic circumstances: Secondary | ICD-10-CM

## 2011-09-29 LAB — LIPID PANEL
Cholesterol: 152 mg/dL (ref 0–200)
Triglycerides: 103 mg/dL (ref <150)

## 2011-09-29 MED ORDER — GABAPENTIN 300 MG PO CAPS: 300.0000 mg | ORAL_CAPSULE | Freq: Three times a day (TID) | ORAL | Status: DC

## 2011-09-29 MED ORDER — HYDROCODONE-ACETAMINOPHEN 7.5-500 MG PO TABS: 1.0000 | ORAL_TABLET | Freq: Three times a day (TID) | ORAL | Status: DC

## 2011-09-29 MED ORDER — CHOLECALCIFEROL 10 MCG (400 UNIT) PO TABS: 400.0000 [IU] | ORAL_TABLET | Freq: Every day | ORAL | Status: DC

## 2011-09-29 MED ORDER — MUPIROCIN 2 % EX OINT: TOPICAL_OINTMENT | Freq: Three times a day (TID) | CUTANEOUS | Status: DC

## 2011-09-29 MED ORDER — ACCU-CHEK NANO SMARTVIEW W/DEVICE KIT: 1.0000 | PACK | Status: DC

## 2011-09-29 NOTE — Progress Notes (Signed)
HPI:  1. Refill on her medications. Past Medical History  Diagnosis Date  . Depression     secondary to loss of her son at age 50  . DMII (diabetes mellitus, type 2)   . Hypertension   . Low back pain   . Hep C w/o coma, chronic   . Insomnia   . Substance abuse     sober since 2001  . Chronic neck pain   . Tobacco abuse   . Hepatitis C   . Insomnia   . 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 Obesity   . Arthritis    Current Outpatient Prescriptions  Medication Sig Dispense Refill  . ACCU-CHEK FASTCLIX LANCETS MISC 1 each by Does not apply route daily. Check blood sugar once daily. 250.00  102 each  4  . ARIPiprazole (ABILIFY) 5 MG tablet Take 1 tablet (5 mg total) by mouth daily.  30 tablet  5  . Blood Glucose Monitoring Suppl (ACCU-CHEK NANO SMARTVIEW) W/DEVICE KIT 1 each by Does not apply route once. 250.00  1 kit  0  . Calcium Carbonate (CALCARB 600) 1500 MG TABS Take 1 tablet (1,500 mg total) by mouth 2 (two) times daily.  60 tablet  5  . cholecalciferol (VITAMIN D-400) 400 UNITS TABS Take 1 tablet (400 Units total) by mouth daily.  30 each  11  . DULoxetine (CYMBALTA) 60 MG capsule Take 2 capsules (120 mg total) by mouth daily.  60 capsule  5  . gabapentin (NEURONTIN) 300 MG capsule Take 1 capsule (300 mg total) by mouth 3 (three) times daily.  90 capsule  5  . glucose blood (ACCU-CHEK SMARTVIEW) test strip Check blood sugar once daily. 250.00  50 each  12  . HYDROcodone-acetaminophen (LORTAB) 7.5-500 MG per tablet Take 1 tablet by mouth 3 (three) times daily. Prescription to be filled on or after 10/04/2011.  90 tablet  0  . metoprolol tartrate (LOPRESSOR) 25 MG tablet Take 1 tablet (25 mg total) by mouth 2 (two) times daily.  60 tablet  5  . mupirocin (BACTROBAN) 2 % ointment Apply topically 3 (three) times daily.  22 g  5  . olmesartan (BENICAR) 20 MG tablet Take 1 tablet (20 mg total) by mouth daily.  30 tablet  5  . omeprazole (PRILOSEC) 20 MG capsule Take 1  capsule (20 mg total) by mouth daily.  30 capsule  5  . traMADol (ULTRAM) 50 MG tablet Take 1 tablet (50 mg total) by mouth every 6 (six) hours as needed for pain.  120 tablet  3  . traZODone (DESYREL) 100 MG tablet Take 1 tablet (100 mg total) by mouth at bedtime.  30 tablet  5  . DISCONTD: cholecalciferol (VITAMIN D-400) 400 UNITS TABS Take 1 tablet (400 Units total) by mouth daily.  30 each  5  . DISCONTD: gabapentin (NEURONTIN) 300 MG capsule Take 1 capsule (300 mg total) by mouth 3 (three) times daily.  90 capsule  5  . DISCONTD: HYDROcodone-acetaminophen (LORTAB) 7.5-500 MG per tablet Take 1 tablet by mouth 3 (three) times daily. Prescription to be filled on or after 09/04/2011.  90 tablet  0  . DISCONTD: mupirocin (BACTROBAN) 2 % ointment Apply topically 3 (three) times daily.  22 g  5  . glipiZIDE (GLUCOTROL XL) 10 MG 24 hr tablet Take 1 tablet (10 mg total) by mouth daily.  30 tablet  5  . metFORMIN (GLUCOPHAGE) 1000 MG tablet Take 1 tablet (1,000 mg total)  by mouth 2 (two) times daily with a meal.  60 tablet  5   Family History  Problem Relation Age of Onset  . Uterine cancer Mother   . Schizophrenia Son   . Diabetes     History   Social History  . Marital Status: Single    Spouse Name: N/A    Number of Children: N/A  . Years of Education: N/A   Social History Main Topics  . Smoking status: Current Everyday Smoker -- 0.4 packs/day  . Smokeless tobacco: Never Used  . Alcohol Use: No  . Drug Use: No  . Sexually Active: None   Other Topics Concern  . None   Social History Narrative   Financial assistance approved for 100% discount at [name] and has [name]  September 29, 2010 2:27 PM    Review of Systems: Constitutional: Denies fever, chills, diaphoresis, appetite change and fatigue.  HEENT: Denies photophobia, eye pain, redness, hearing loss, ear pain, congestion, sore throat, rhinorrhea, sneezing, mouth sores, trouble swallowing, neck pain, neck stiffness  and tinnitus.  Respiratory: Denies SOB, DOE, cough, chest tightness, and wheezing.  Cardiovascular: Denies chest pain, palpitations and leg swelling.  Gastrointestinal: Denies nausea, vomiting, abdominal pain, diarrhea, constipation, blood in stool and abdominal distention.  Genitourinary: Denies dysuria, urgency, frequency, hematuria, flank pain and difficulty urinating.  Musculoskeletal: Denies myalgias, back pain, joint swelling, arthralgias and gait problem.  Skin: Denies pallor, rash and wound.  Neurological: Denies dizziness, seizures, syncope, weakness, light-headedness, numbness and headaches.  Hematological: Denies adenopathy. Easy bruising, personal or family bleeding history  Psychiatric/Behavioral: Denies suicidal ideation, mood changes, confusion, nervousness, sleep disturbance and agitation   Vitals: reviewed General: alert, well-developed, and cooperative to examination.  Head: normocephalic and atraumatic.  Eyes: vision grossly intact, pupils equal, pupils round, pupils reactive to light, no injection and anicteric.  Mouth: pharynx pink and moist, no erythema, and no exudates.  Neck: supple, full ROM, no thyromegaly, no JVD, and no carotid bruits.  Lungs: normal respiratory effort, no accessory muscle use, normal breath sounds, no crackles, and no wheezes. Heart: normal rate, regular rhythm, no murmur, no gallop, and no rub.  Abdomen: soft, non-tender, normal bowel sounds, no distention, no guarding, no rebound tenderness, no hepatomegaly, and no splenomegaly.  Msk: no joint swelling, no joint warmth, and no redness over joints.  Pulses: 2+ DP/PT pulses bilaterally Extremities: No cyanosis, clubbing, edema Neurologic: alert & oriented X3, cranial nerves II-XII intact, strength normal in all extremities, sensation intact to light touch, and gait normal.  Skin: turgor normal and no rashes.  Psych: Oriented X3, memory intact for recent and remote, normally interactive, good eye  contact, not anxious appearing, and not depressed appearing.    Assessment & Plan:  1. 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 - Strongly adivsed against polypharmacy --rationale explained.  -Strongly advised to receive her Rx  ONLY from our clinic and [name] -refilled Lortab  2. Severe depression and anxiety -D/C fluoxetine (patient never filled a Rx written by Dr. Lyn Hollingshead); Rx was destroyed in the office. -Medication contract for Klonopin.  3. HTN -needs a tighter control -weight management discussed -risks of HTN explained to the patient -"patient would like to think about starting" anit-HTn medication.

## 2011-09-29 NOTE — Progress Notes (Deleted)
  Subjective:    Patient ID: [name], female    DOB: 01/31/1961, 50 y.o.   MRN: [id number]  HPI    Review of Systems     Objective:   Physical Exam        Assessment & Plan:   

## 2011-09-29 NOTE — Patient Instructions (Signed)
Please, restart your medications as instructed. Please, do not fill your prescriptions from  Multiple doctors and/or clinics. Please, call with any concerns and follow up in 4 weeks.

## 2011-10-04 ENCOUNTER — Encounter
Payer: Medicaid Other | Attending: Physical Medicine and Rehabilitation | Admitting: Physical Medicine and Rehabilitation

## 2011-10-04 DIAGNOSIS — M76899 Other specified enthesopathies of unspecified lower limb, excluding foot: Secondary | ICD-10-CM

## 2011-10-04 DIAGNOSIS — M1710 Unilateral primary osteoarthritis, unspecified knee: Secondary | ICD-10-CM | POA: Insufficient documentation

## 2011-10-04 DIAGNOSIS — M25559 Pain in unspecified hip: Secondary | ICD-10-CM | POA: Insufficient documentation

## 2011-10-04 DIAGNOSIS — E1149 Type 2 diabetes mellitus with other diabetic neurological complication: Secondary | ICD-10-CM | POA: Insufficient documentation

## 2011-10-04 DIAGNOSIS — M47814 Spondylosis without myelopathy or radiculopathy, thoracic region: Secondary | ICD-10-CM | POA: Insufficient documentation

## 2011-10-04 DIAGNOSIS — M546 Pain in thoracic spine: Secondary | ICD-10-CM | POA: Insufficient documentation

## 2011-10-04 DIAGNOSIS — M47817 Spondylosis without myelopathy or radiculopathy, lumbosacral region: Secondary | ICD-10-CM | POA: Insufficient documentation

## 2011-10-04 DIAGNOSIS — M47812 Spondylosis without myelopathy or radiculopathy, cervical region: Secondary | ICD-10-CM | POA: Insufficient documentation

## 2011-10-04 DIAGNOSIS — M545 Low back pain, unspecified: Secondary | ICD-10-CM | POA: Insufficient documentation

## 2011-10-04 DIAGNOSIS — M542 Cervicalgia: Secondary | ICD-10-CM

## 2011-10-04 DIAGNOSIS — M25529 Pain in unspecified elbow: Secondary | ICD-10-CM

## 2011-10-04 DIAGNOSIS — E1142 Type 2 diabetes mellitus with diabetic polyneuropathy: Secondary | ICD-10-CM | POA: Insufficient documentation

## 2011-10-04 DIAGNOSIS — F1911 Other psychoactive substance abuse, in remission: Secondary | ICD-10-CM | POA: Insufficient documentation

## 2011-10-04 DIAGNOSIS — R209 Unspecified disturbances of skin sensation: Secondary | ICD-10-CM | POA: Insufficient documentation

## 2011-10-04 DIAGNOSIS — M25519 Pain in unspecified shoulder: Secondary | ICD-10-CM | POA: Insufficient documentation

## 2011-10-05 NOTE — Assessment & Plan Note (Signed)
HISTORY:  [name] is a pleasant 50 year old single African American woman who has multiple pain complaints, predominantly shoulder, midback, bilateral hips consist of the trochanteric bursitis as well as knee pain.  Her average pain is about a 7 to an 8 on a scale of 10.  She is back in today for review of her radiographs and her thoracic MRI, the results are attached to the chart.  Briefly, she has a stable mild tricompartmental degenerative changes of her left knee as well as stable tricompartmental degenerative changes of her right knee, most advanced at the patellofemoral joint in the right knee.  Her thoracic MRI was notable for mild thoracic spondylosis at T11-T12 without cord deformity or significant stenosis.  The results of this were reviewed with her.  FUNCTIONAL STATUS:  She can walk 5 minutes.  She has difficulty with stairs.  She does drive. She is independent with self-care.  Denies problems with bowel or bladder.  Admits to depression and anxiety.  Denies suicidal ideation.  Reports occasional night sweats and weight loss.  Follow up with primary care for these problems.  PAST MEDICAL HISTORY, SOCIAL HISTORY, AND FAMILY HISTORY:  Otherwise unchanged.  She does have a history of substance abuse, however, and is on nonnarcotic management.  PHYSICAL EXAMINATION:  VITAL SIGNS:  Today, blood pressure is 1 6/79, pulse 87, respirations 16, 97% saturated on room air. GENERAL:  She is a well-developed obese woman, who does not appear in any distress.  She is oriented x3.  Speech is clear.  Affect is bright. She is alert, cooperative, and pleasant.  Follows commands without difficulty.  Answers my questions appropriately.  Cranial nerves, coordination are grossly intact.  Her reflexes are 2+ at the patellar tendons, 2+ at the Achilles tendon.  No abnormal tone, clonus, tremors are noted today.  She has limited motion in the right shoulder with internal rotation,  external is about 80 degrees.  She has about 90 degrees of B adduction on the right side in the right shoulder and left shoulder is relatively within normal limits.  Limitations in lumbar motion are noted and complains of pain with extension.  Motor strength is generally good in both upper and lower extremities.  She has tenderness over both trochanters right worse than left.  Decreased sensation below the knees is noted as well.  IMPRESSION: 1. Lumbago. 2. Bilateral knee osteoarthritis, more patellofemoral involvement on     the right. 3. Bilateral trochanteric bursitis. 4. Bilateral leg numbness.  History of diabetic neuropathy. 5. Cervicalgia with cervical spondylosis. 6. Lumbago with known lumbar spondylosis. 7. History of substance abuse.  Documented in the chart on July 14, 2011.  PLAN:  We will review treatment options for her bilateral hips, I would like her to get involved in some physical therapy, orders were written for this.  She would like to consider injection as well.  I will go ahead and get this set up for her.  I would consider electrodiagnostic studies for bilateral lower extremities.  I will switch her gabapentin to slightly higher dose.  The prescription is written for gabapentin 300 mg 1 p.o. q.7 a.m. 11:00 a.m., 3:00 p.m., 7:00 p.m, and 1 at 2 a.m. #150 with a refill.  I have answered all her questions.  She is nonnarcotic management as she is comfortable with the treatment plan at this time.     [name], M.D. Electronically Signed    DMK/MedQ D:  10/04/2011  14:27:07  T:  10/05/2011 01:09:55  Job #:  [id number]

## 2011-10-06 ENCOUNTER — Telehealth: Payer: Self-pay | Admitting: Licensed Clinical Social Worker

## 2011-10-11 NOTE — Telephone Encounter (Signed)
Referred by Dr. Denton Meek due to financial issues. 50 yo female patient w/ multiple health and MH issues including diabetes. Lives in section 8 housing and has no income.  Currently has disability claim in process and in appeals phase.  Has Medicaid.   Has aide thru [facility] and satisfied w/ that service. Supports include son who is in and out helping her.  Foodstamps are $200 per month.   Main issue is that she cannot afford her medication copays and therefore has not been able to fill meds for months.  Explained [facility] service to her and she was receptive.   The plan is to refer to [facility] to begin delivering meds and help w/ copays.   She said that Medicaid had not been paying for Abilify and Benicar.  I told her to go to Mental Health re: Abilify for authorization process since they are prescribing.  I will let Triage know about problem w/ Benicar and that I am referring to [facility]

## 2011-10-18 ENCOUNTER — Ambulatory Visit: Payer: Medicaid Other | Admitting: Physical Therapy

## 2011-10-25 ENCOUNTER — Other Ambulatory Visit: Payer: Self-pay | Admitting: *Deleted

## 2011-10-25 DIAGNOSIS — E119 Type 2 diabetes mellitus without complications: Secondary | ICD-10-CM

## 2011-10-25 DIAGNOSIS — G8929 Other chronic pain: Secondary | ICD-10-CM

## 2011-10-25 DIAGNOSIS — I10 Essential (primary) hypertension: Secondary | ICD-10-CM

## 2011-10-25 MED ORDER — METOPROLOL TARTRATE 25 MG PO TABS: 12.5000 mg | ORAL_TABLET | Freq: Two times a day (BID) | ORAL | Status: DC

## 2011-10-25 MED ORDER — CALCIUM CARBONATE 1500 (600 CA) MG PO TABS: 1.0000 | ORAL_TABLET | Freq: Two times a day (BID) | ORAL | Status: DC

## 2011-10-25 MED ORDER — TRAMADOL HCL 50 MG PO TABS: 50.0000 mg | ORAL_TABLET | Freq: Four times a day (QID) | ORAL | Status: DC | PRN

## 2011-10-25 MED ORDER — ARIPIPRAZOLE 5 MG PO TABS: 5.0000 mg | ORAL_TABLET | Freq: Every day | ORAL | Status: DC

## 2011-10-25 MED ORDER — OMEPRAZOLE 20 MG PO CPDR: 20.0000 mg | DELAYED_RELEASE_CAPSULE | Freq: Every day | ORAL | Status: DC

## 2011-10-25 MED ORDER — METFORMIN HCL 1000 MG PO TABS: 1000.0000 mg | ORAL_TABLET | Freq: Two times a day (BID) | ORAL | Status: DC

## 2011-10-25 MED ORDER — OLMESARTAN MEDOXOMIL 20 MG PO TABS: 20.0000 mg | ORAL_TABLET | Freq: Every day | ORAL | Status: DC

## 2011-10-25 NOTE — Telephone Encounter (Signed)
Pt called and stated she has changed pharmacy; now using [facility] and they need new rxs Thanks

## 2011-10-26 ENCOUNTER — Other Ambulatory Visit: Payer: Self-pay | Admitting: *Deleted

## 2011-10-26 DIAGNOSIS — I10 Essential (primary) hypertension: Secondary | ICD-10-CM

## 2011-10-26 DIAGNOSIS — F32A Depression, unspecified: Secondary | ICD-10-CM

## 2011-10-26 DIAGNOSIS — E119 Type 2 diabetes mellitus without complications: Secondary | ICD-10-CM

## 2011-10-26 DIAGNOSIS — F329 Major depressive disorder, single episode, unspecified: Secondary | ICD-10-CM

## 2011-10-26 DIAGNOSIS — M722 Plantar fascial fibromatosis: Secondary | ICD-10-CM

## 2011-10-26 MED ORDER — MUPIROCIN 2 % EX OINT: TOPICAL_OINTMENT | Freq: Three times a day (TID) | CUTANEOUS | Status: DC

## 2011-10-26 MED ORDER — GABAPENTIN 300 MG PO CAPS: 300.0000 mg | ORAL_CAPSULE | Freq: Three times a day (TID) | ORAL | Status: DC

## 2011-10-26 MED ORDER — GLUCOSE BLOOD VI STRP: ORAL_STRIP | Status: DC

## 2011-10-26 MED ORDER — METOPROLOL TARTRATE 25 MG PO TABS: 12.5000 mg | ORAL_TABLET | Freq: Two times a day (BID) | ORAL | Status: DC

## 2011-10-26 MED ORDER — DULOXETINE HCL 60 MG PO CPEP: 120.0000 mg | ORAL_CAPSULE | Freq: Every day | ORAL | Status: DC

## 2011-10-26 MED ORDER — ACCU-CHEK FASTCLIX LANCETS MISC: 1.0000 | Freq: Every day | Status: DC

## 2011-10-26 MED ORDER — GLIPIZIDE ER 10 MG PO TB24: 10.0000 mg | ORAL_TABLET | Freq: Every day | ORAL | Status: DC

## 2011-10-26 MED ORDER — TRAZODONE HCL 100 MG PO TABS: 100.0000 mg | ORAL_TABLET | Freq: Every day | ORAL | Status: DC

## 2011-10-26 NOTE — Telephone Encounter (Signed)
Pt also need a refill on her Wellbutrin and Prozac.

## 2011-10-26 NOTE — Telephone Encounter (Signed)
Rxs faxed to [facility] [phone]).

## 2011-10-26 NOTE — Telephone Encounter (Signed)
Prescriptions faxed to the [facility].

## 2011-10-26 NOTE — Telephone Encounter (Signed)
Will not refill Prozac and wellbutrin-she is already on Cymbalta-high risk for SSRI overdose/seratonin syndrome. I placed referral for psych.

## 2011-11-01 ENCOUNTER — Other Ambulatory Visit: Payer: Self-pay | Admitting: *Deleted

## 2011-11-01 ENCOUNTER — Encounter: Payer: Medicaid Other | Admitting: Internal Medicine

## 2011-11-01 DIAGNOSIS — F32A Depression, unspecified: Secondary | ICD-10-CM

## 2011-11-01 DIAGNOSIS — E119 Type 2 diabetes mellitus without complications: Secondary | ICD-10-CM

## 2011-11-01 DIAGNOSIS — F329 Major depressive disorder, single episode, unspecified: Secondary | ICD-10-CM

## 2011-11-01 NOTE — Telephone Encounter (Signed)
Pharmacy is requesting 6 months refill if possible. Angelina Ok, RN 11/01/2011 10:20 AM

## 2011-11-02 ENCOUNTER — Telehealth: Payer: Self-pay | Admitting: Licensed Clinical Social Worker

## 2011-11-02 NOTE — Telephone Encounter (Signed)
Referral from MD for psychiatry.  Patient already connected to psychiatry and will f/u w/ her to determine details.

## 2011-11-03 ENCOUNTER — Telehealth: Payer: Self-pay | Admitting: Licensed Clinical Social Worker

## 2011-11-03 ENCOUNTER — Encounter
Payer: Medicaid Other | Attending: Physical Medicine and Rehabilitation | Admitting: Physical Medicine and Rehabilitation

## 2011-11-03 DIAGNOSIS — M546 Pain in thoracic spine: Secondary | ICD-10-CM | POA: Insufficient documentation

## 2011-11-03 DIAGNOSIS — M25559 Pain in unspecified hip: Secondary | ICD-10-CM | POA: Insufficient documentation

## 2011-11-03 DIAGNOSIS — M76899 Other specified enthesopathies of unspecified lower limb, excluding foot: Secondary | ICD-10-CM

## 2011-11-03 DIAGNOSIS — G894 Chronic pain syndrome: Secondary | ICD-10-CM

## 2011-11-03 DIAGNOSIS — M25519 Pain in unspecified shoulder: Secondary | ICD-10-CM | POA: Insufficient documentation

## 2011-11-03 NOTE — Telephone Encounter (Signed)
Called patient to verify information about psychiatrist. Patient is already connected w/ [facility] in [place], Dr. Marcelle Overlie  [phone].  She goes there quarterly and has an appmt in January.  She has been going there since at least 2008.

## 2011-11-03 NOTE — Procedures (Signed)
NAMETAMMARA, MASSING           ACCOUNT NO.:  [id number]  MEDICAL RECORD NO.:  [id number]           PATIENT TYPE:  O  LOCATION:  PRM                          FACILITY:  [facility]  PHYSICIAN:  Brantley Stage, M.D.DATE OF BIRTH:  1961/10/23  DATE OF PROCEDURE:  11/03/2011 DATE OF DISCHARGE:                              OPERATIVE REPORT  [name] is a 50 year old single African American woman who has multiple pain complaints including shoulder, mid back, bilateral hips as well as diagnosed trochanteric bursitis bilaterally worse on the right than left, history of knee pain.  She was last seen by me on October 29.  At that time, she was interested in hip injection for trochanteric bursitis.  I had reviewed other treatment options with her. She has used up her 3 visits of physical therapy allowed to her.  She is still having some hip pain which interferes with sleep and ambulation. I reviewed treatment options with her as well as risks and benefits of hip injection.  She would like to proceed.  Ultrasound guidance was used.  Greater trochanter on the right was located and the skin area was marked.  The area was then prepped with Betadine and alcohol, and using a 25-gauge needle, 1 mL of Kenalog and 5 mL of 1% lidocaine was easily injected into the area of the right greater trochanter.  The patient tolerated the procedure very well.  She reports some improvement in the right lateral hip after the procedure.  I went over post injection instructions with her.  I have answered all her questions.  She would like to follow up back in our clinic after Christmas.  I will give her a follow up in 6 weeks then.  She is comfortable with this treatment plan and agrees with it.     Brantley Stage, M.D. Electronically Signed    DMK/MEDQ  D:  11/03/2011 09:13:56  T:  11/03/2011 09:24:34  Job:  [id number]

## 2011-11-04 ENCOUNTER — Telehealth: Payer: Self-pay | Admitting: *Deleted

## 2011-11-04 ENCOUNTER — Ambulatory Visit (INDEPENDENT_AMBULATORY_CARE_PROVIDER_SITE_OTHER): Payer: Medicaid Other | Admitting: Ophthalmology

## 2011-11-04 ENCOUNTER — Encounter: Payer: Self-pay | Admitting: Ophthalmology

## 2011-11-04 VITALS — BP 182/120 | HR 85 | Temp 97.2°F | Ht 71.0 in | Wt 288.7 lb

## 2011-11-04 DIAGNOSIS — F329 Major depressive disorder, single episode, unspecified: Secondary | ICD-10-CM

## 2011-11-04 DIAGNOSIS — I10 Essential (primary) hypertension: Secondary | ICD-10-CM

## 2011-11-04 DIAGNOSIS — M722 Plantar fascial fibromatosis: Secondary | ICD-10-CM

## 2011-11-04 DIAGNOSIS — Z5181 Encounter for therapeutic drug level monitoring: Secondary | ICD-10-CM

## 2011-11-04 DIAGNOSIS — G894 Chronic pain syndrome: Secondary | ICD-10-CM

## 2011-11-04 DIAGNOSIS — E119 Type 2 diabetes mellitus without complications: Secondary | ICD-10-CM

## 2011-11-04 MED ORDER — METFORMIN HCL 1000 MG PO TABS: 1000.0000 mg | ORAL_TABLET | Freq: Two times a day (BID) | ORAL | Status: DC

## 2011-11-04 MED ORDER — OMEPRAZOLE 20 MG PO CPDR: 20.0000 mg | DELAYED_RELEASE_CAPSULE | Freq: Every day | ORAL | Status: DC

## 2011-11-04 MED ORDER — HYDROCODONE-ACETAMINOPHEN 7.5-500 MG PO TABS: 1.0000 | ORAL_TABLET | Freq: Three times a day (TID) | ORAL | Status: DC

## 2011-11-04 MED ORDER — LOSARTAN POTASSIUM 50 MG PO TABS: 50.0000 mg | ORAL_TABLET | Freq: Every day | ORAL | Status: DC

## 2011-11-04 MED ORDER — ARIPIPRAZOLE 5 MG PO TABS: 5.0000 mg | ORAL_TABLET | Freq: Every day | ORAL | Status: DC

## 2011-11-04 NOTE — Assessment & Plan Note (Signed)
Uncontrolled on beta blocker alone. Started her on Losartan today and stopped Benicar since prior authorization was denied. Will recheck BP in one month.

## 2011-11-04 NOTE — Progress Notes (Signed)
  Subjective:    Patient ID: [name], female    DOB: 11-11-1961, 50 y.o.   MRN: [id number]  HPI    Review of Systems     Objective:   Physical Exam        Assessment & Plan:

## 2011-11-04 NOTE — Progress Notes (Signed)
Subjective:   Patient ID: [name] female   DOB: 1961/10/31 50 y.o.   MRN: [id number]  HPI: NicoleDominic [name] is a 50 y.o. woman here for refill of her vicodin and inability to fill benicar.  DM: sugars are extremely well controlled. Brought in log and values before breakfast were in 110s-120s. Patient is trying to lose weight and had lost over 30 lbs per chart review.  HTN: Medicaid would not fill prescription and suggested Losartan. Was previously on a different program which covered it. Hasn't been on an ACE inhibitor per records in [place] and [address].   Pain: Arthritis in neck, hips, knees, heel spurs. Has been taking vicodin for about a year. Is also taking tramadol. Will do urine tox today since none done in several months. Wasn't taking gabapentin since it was too expensive, has been taking it for the past 3 weeks. Doesn't notice further improvement in her pain. Pain clinic wanted her on higher dose of gabapentin, will let her up the dose to the dosage they ordered and will check back with her in a month. Is getting hip injection at pain clinic, doesn't notice an improvement yet but was 2 days ago.  Depression: mood is been all right. Has sadness when she thinks about her two sons, one has passed away about a year ago, and one has schizophrenia. Caregiver comes 4 hours a day for 5 days a week which gives her company. She helps with bathing, house cleaning, and grocery shopping etc. Sees [facility] in [place]. They want her to start class on depression every Monday. She is hesitant to commit to something that would require her to leave the house regularly.  Past Medical History  Diagnosis Date  . Depression     secondary to loss of her son at age 62  . DMII (diabetes mellitus, type 2)   . Hypertension   . Low back pain   . Hep C w/o coma, chronic   . Insomnia   . Substance abuse     sober since 2001  . Chronic neck pain   . Tobacco abuse   . Hepatitis C   .  Insomnia   . 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 Obesity   . Arthritis    Current Outpatient Prescriptions  Medication Sig Dispense Refill  . ACCU-CHEK FASTCLIX LANCETS MISC 1 each by Does not apply route daily. Check blood sugar once daily. 250.00  102 each  4  . ARIPiprazole (ABILIFY) 5 MG tablet Take 1 tablet (5 mg total) by mouth daily.  30 tablet  0  . Blood Glucose Monitoring Suppl (ACCU-CHEK NANO SMARTVIEW) W/DEVICE KIT 1 each by Does not apply route 1 day or 1 dose.  1 kit  0  . Calcium Carbonate (CALCARB 600) 1500 MG TABS Take 1 tablet (1,500 mg total) by mouth 2 (two) times daily.  60 tablet  0  . cholecalciferol (VITAMIN D-400) 400 UNITS TABS Take 1 tablet (400 Units total) by mouth daily.  30 each  11  . DULoxetine (CYMBALTA) 60 MG capsule Take 2 capsules (120 mg total) by mouth daily.  60 capsule  3  . gabapentin (NEURONTIN) 300 MG capsule Take 1 capsule (300 mg total) by mouth 3 (three) times daily.  90 capsule  5  . glipiZIDE (GLUCOTROL XL) 10 MG 24 hr tablet Take 1 tablet (10 mg total) by mouth daily.  30 tablet  5  . glucose blood (ACCU-CHEK SMARTVIEW) test strip  Check blood sugar once daily. 250.00  50 each  12  . HYDROcodone-acetaminophen (LORTAB) 7.5-500 MG per tablet Take 1 tablet by mouth 3 (three) times daily. Prescription to be filled on or after 10/04/2011.  90 tablet  0  . metFORMIN (GLUCOPHAGE) 1000 MG tablet Take 1 tablet (1,000 mg total) by mouth 2 (two) times daily with a meal.  60 tablet  0  . metoprolol tartrate (LOPRESSOR) 25 MG tablet Take 0.5 tablets (12.5 mg total) by mouth 2 (two) times daily.  60 tablet  0  . mupirocin ointment (BACTROBAN) 2 % Apply topically 3 (three) times daily.  22 g  5  . olmesartan (BENICAR) 20 MG tablet Take 1 tablet (20 mg total) by mouth daily.  30 tablet  0  . omeprazole (PRILOSEC) 20 MG capsule Take 1 capsule (20 mg total) by mouth daily.  30 capsule  0  . traMADol (ULTRAM) 50 MG tablet Take 1 tablet (50 mg total) by  mouth every 6 (six) hours as needed for pain.  120 tablet  0  . traZODone (DESYREL) 100 MG tablet Take 1 tablet (100 mg total) by mouth at bedtime.  30 tablet  3   Family History  Problem Relation Age of Onset  . Uterine cancer Mother   . Schizophrenia Son   . Diabetes     History   Social History  . Marital Status: Single    Spouse Name: N/A    Number of Children: N/A  . Years of Education: N/A   Social History Main Topics  . Smoking status: Current Everyday Smoker -- 0.4 packs/day  . Smokeless tobacco: Never Used  . Alcohol Use: No  . Drug Use: No  . Sexually Active: None   Other Topics Concern  . None   Social History Narrative   Financial assistance approved for 100% discount at [facility] and has [facility]  September 29, 2010 2:27 PM   Objective:  Physical Exam: Filed Vitals:   11/04/11 1622  BP: 182/120  Pulse: 85  Temp: 97.2 F (36.2 C)  TempSrc: Oral  Height: 5\' 11"  (1.803 m)  Weight: 288 lb 11.2 oz (130.953 kg)   General: pleasant obese woman with a neck collar on, slightly blank expression at times HEENT: PERRL, EOMI, no scleral icterus, has momentary dysconjugate gaze Cardiac: RRR, no rubs, murmurs or gallops, fast Pulm: clear to auscultation bilaterally, moving normal volumes of air Neuro: alert and oriented X3, cranial nerves II-XII grossly intact Psych: patient does not seem overtly depressed but is slightly limited in affect  Assessment & Plan:

## 2011-11-04 NOTE — Patient Instructions (Signed)
Please consider going to [facility] and doing water aerobics. Exercise can be very helpful for people with pain.

## 2011-11-04 NOTE — Telephone Encounter (Signed)
Called [phone] denied prior authorization on Benicar - needs to try Diovan or Losartan. Has appt today with Dr Maisie Fus. Stanton Kidney Sudiksha Victor RN 11/04/11 10AM

## 2011-11-04 NOTE — Assessment & Plan Note (Signed)
Patient is taking cymbalta and abilify. She takes trazodone for sleep. Is getting regular mental health follow up. Encouraged her to attend weekly class on depression. She would benefit from regular interaction and positive activities.

## 2011-11-04 NOTE — Assessment & Plan Note (Signed)
Patient's pain is manageable with current regimen. She wishes to increase gabapentin per pain clinic's reccs. Will allow her to fill script from them and will get dosages at next appt. She is also getting injections at pain clinic. Follow up UDS at next visit. Encouraged her to go to exercise class so she can build some muscle pain tolerance.

## 2011-11-04 NOTE — Assessment & Plan Note (Addendum)
Very well controlled on glipizide and metformin. HgA1c is 6.5 today. Encouraged exercise and continued weight loss. Congratulated on good control. LDL 91.

## 2011-11-05 NOTE — Progress Notes (Addendum)
Both meds from visit called to [facility], vicodin and cozaar.

## 2011-11-05 NOTE — Progress Notes (Signed)
agree with plan and notes 

## 2011-11-08 ENCOUNTER — Telehealth: Payer: Self-pay | Admitting: *Deleted

## 2011-11-08 LAB — AMPHETAMINES (GC/LC/MS), URINE
Amphetamine GC/MS Conf: NEGATIVE NG/ML
MDA GC/MS confirm: NEGATIVE NG/ML
MDMA GC/MS Conf: NEGATIVE NG/ML
Methamphetamine Quant, Ur: NEGATIVE NG/ML

## 2011-11-08 LAB — FENTANYL (GC/LC/MS), URINE: Fentanyl, confirm: NEGATIVE NG/ML

## 2011-11-08 LAB — PRESCRIPTION ABUSE MONITORING 15P, URINE
Amphetamine/Meth: POSITIVE NG/ML — ABNORMAL HIGH
Barbiturate Screen, Urine: NEGATIVE NG/ML
Buprenorphine, Urine: NEGATIVE NG/ML
Carisoprodol, Urine: NEGATIVE NG/ML
Cocaine Metabolites: NEGATIVE NG/ML
Creatinine, Urine: 271.5 MG/DL
Fentanyl, Ur: POSITIVE NG/ML — ABNORMAL HIGH
Opiate Screen, Urine: NEGATIVE NG/ML
Propoxyphene: NEGATIVE NG/ML
Zolpidem, Urine: NEGATIVE NG/ML

## 2011-11-08 NOTE — Telephone Encounter (Signed)
Called [facility] to check on delivery date for pt's scripts from visit last week, was informed the cozaar will need a Prior authorization before it will be filled, would you like to change to something else?

## 2011-11-09 ENCOUNTER — Other Ambulatory Visit: Payer: Self-pay | Admitting: Internal Medicine

## 2011-11-09 ENCOUNTER — Telehealth: Payer: Self-pay | Admitting: *Deleted

## 2011-11-09 ENCOUNTER — Encounter: Payer: Self-pay | Admitting: Internal Medicine

## 2011-11-09 DIAGNOSIS — I10 Essential (primary) hypertension: Secondary | ICD-10-CM

## 2011-11-09 MED ORDER — LISINOPRIL-HYDROCHLOROTHIAZIDE 10-12.5 MG PO TABS: 1.0000 | ORAL_TABLET | Freq: Every day | ORAL | Status: DC

## 2011-11-09 NOTE — Progress Notes (Signed)
Random UDS 15 point screen obtained from patient at 11/29 visit. Positive for tramadol appropriate, negative for prescribed hydrocodone, two false positives for AMP.FENT(confirmations negative). Based on this UDS she does not have prescribed Hydrocodone in her urine-I anticipate this is due to uncontrolled pain and that she is taking up her medicine quite early and relying on tramadol to get her by until her visits. This may be a UDS that we want to monitor closely -monthly. A negative primary drug may mean under-treated pain, pseudoaddiction, misuse or diversion-will need closer monitoring and close attention to her functional status- I notice that each visit unresolved pain is an issue- may need to consider long acting pain medication and very tight monitoring of pills. Prn use for someone who has chronic pain is probably most difficult to manage and most likely to be inappropriately discontinued due to interpretation of UDS results.

## 2011-11-09 NOTE — Progress Notes (Signed)
Was notified that pt cannot qualify for ARB tx until has a trial of ACEi.  Per Dr. Maisie Fus' last note, no documented trial of ACEi in echart or epic.  No listed allergy to ACEi.  Wrote Rx for lisinopril/HCTZ combo pill.  Will write for bmet in 2-4 weeks as well.

## 2011-11-09 NOTE — Telephone Encounter (Signed)
Insurance company [phone] denied Losartan - pt needs to try any ace inhibitor first. Flag sent to Dr Abner Greenspan. Stanton Kidney Daiton Cowles RN 11/09/11 11:24AM

## 2011-11-09 NOTE — Progress Notes (Deleted)
  Subjective:    Patient ID: [name], female    DOB: 10/31/1961, 50 y.o.   MRN: [id number]  HPI    Review of Systems     Objective:   Physical Exam        Assessment & Plan:   

## 2011-11-10 ENCOUNTER — Telehealth: Payer: Self-pay | Admitting: *Deleted

## 2011-11-10 NOTE — Telephone Encounter (Signed)
Pt aware new meds sent to pharm from Dr Abner Greenspan - advise to have labs 2-4 weeks after starting new med. Stanton Kidney Keirstyn Aydt RN 11/10/11 12N

## 2011-11-17 ENCOUNTER — Encounter: Payer: Self-pay | Admitting: Internal Medicine

## 2011-11-17 ENCOUNTER — Ambulatory Visit (INDEPENDENT_AMBULATORY_CARE_PROVIDER_SITE_OTHER): Payer: Medicaid Other | Admitting: Internal Medicine

## 2011-11-17 DIAGNOSIS — G894 Chronic pain syndrome: Secondary | ICD-10-CM

## 2011-11-17 DIAGNOSIS — I10 Essential (primary) hypertension: Secondary | ICD-10-CM

## 2011-11-17 DIAGNOSIS — E119 Type 2 diabetes mellitus without complications: Secondary | ICD-10-CM

## 2011-11-17 NOTE — Assessment & Plan Note (Signed)
Patient's blood pressure is still elevated, note she was started on a new blood pressure medication that she has not yet filled. I encouraged the patient to fill her prescription that was given to her last month, take it for several days and return to the clinic for followup so we will be able to make further adjustments, check basic metabolic panel, etc..

## 2011-11-17 NOTE — Patient Instructions (Signed)
Please take your new blood pressure medications and return 1-2 weeks after for a recheck of your BP.

## 2011-11-17 NOTE — Assessment & Plan Note (Signed)
Excellently controlled, no changes made

## 2011-11-17 NOTE — Assessment & Plan Note (Signed)
Also asked for an early refill of her narcotics she last had a refill 2 weeks ago for 90 tablets of Vicodin, on chart review it appears that last time her UDS was positive for amphetamine and fentanyl negative for opiates , and confirmatory screen was negative for amphetamine and fentanyl. When I asked the patient she states that she was taking her narcotics and had not run out last month when she had her UDS taken. This is indeed a confusing picture, and the patient does display drug-seeking behavior, I will not refill her prescription, and a for this issue to her primary care physician to decide whether he/she wants to continue prescribing the patient narcotics

## 2011-11-17 NOTE — Progress Notes (Signed)
Subjective:    Patient ID: [name], female    DOB: 1961/01/24, 50 y.o.   MRN: [id number]  HPI  Patient is a 50 year old female with a past medical history listed below, presents to the outpatient clinic for followup of her blood pressure, one month ago she was "started" on lisinopril, she was due to return today for a basic metabolic panel a blood pressure check,  However the patient has not filled her prescription, stating that she did not have time. She does however complain of left shoulder pain describes as tingling and aggravated with movement. Also asked for an early refill of her narcotics, on chart review it appears that last time her UDS was positive for amphetamine and fentanyl negative for opiates , and confirmatory screen was negative for amphetamine and fentanyl. When I asked the patient she states that she was taking her narcotics and had not run out last month when she had her UDS taken.   Patient Active Problem List  Diagnoses  . HEPATITIS C  . DIABETES MELLITUS, TYPE II  . DIABETIC PERIPHERAL NEUROPATHY  . DEPRESSION  . Chronic pain syndrome  . HYPERTENSION  . INSOMNIA  . Plantar fasciitis  . Blood in stool  . Left hand pain   Current Outpatient Prescriptions on File Prior to Visit  Medication Sig Dispense Refill  . ACCU-CHEK FASTCLIX LANCETS MISC 1 each by Does not apply route [name]. Check blood sugar once [name]. 250.00  102 each  4  . ARIPiprazole (ABILIFY) 5 MG tablet Take 1 tablet (5 mg total) by mouth [name].  30 tablet  6  . Blood Glucose Monitoring Suppl (ACCU-CHEK NANO SMARTVIEW) W/DEVICE KIT 1 each by Does not apply route 1 day or 1 dose.  1 kit  0  . Calcium Carbonate (CALCARB 600) 1500 MG TABS Take 1 tablet (1,500 mg total) by mouth 2 (two) times [name].  60 tablet  0  . cholecalciferol (VITAMIN D-400) 400 UNITS TABS Take 1 tablet (400 Units total) by mouth [name].  30 each  11  . DULoxetine (CYMBALTA) 60 MG capsule Take 2 capsules (120 mg total) by  mouth [name].  60 capsule  3  . gabapentin (NEURONTIN) 300 MG capsule Take 1 capsule (300 mg total) by mouth 3 (three) times [name].  90 capsule  5  . glipiZIDE (GLUCOTROL XL) 10 MG 24 hr tablet Take 1 tablet (10 mg total) by mouth [name].  30 tablet  5  . glucose blood (ACCU-CHEK SMARTVIEW) test strip Check blood sugar once [name]. 250.00  50 each  12  . HYDROcodone-acetaminophen (LORTAB) 7.5-500 MG per tablet Take 1 tablet by mouth 3 (three) times [name]. Prescription to be filled on or after 10/04/2011.  90 tablet  0  . lisinopril-hydrochlorothiazide (PRINZIDE,ZESTORETIC) 10-12.5 MG per tablet Take 1 tablet by mouth [name].  31 tablet  3  . metFORMIN (GLUCOPHAGE) 1000 MG tablet Take 1 tablet (1,000 mg total) by mouth 2 (two) times [name] with a meal.  60 tablet  6  . metoprolol tartrate (LOPRESSOR) 25 MG tablet Take 0.5 tablets (12.5 mg total) by mouth 2 (two) times [name].  60 tablet  0  . mupirocin ointment (BACTROBAN) 2 % Apply topically 3 (three) times [name].  22 g  5  . omeprazole (PRILOSEC) 20 MG capsule Take 1 capsule (20 mg total) by mouth [name].  30 capsule  6  . traMADol (ULTRAM) 50 MG tablet Take 1 tablet (50 mg total) by mouth every 6 (six) hours  as needed for pain.  120 tablet  0  . traZODone (DESYREL) 100 MG tablet Take 1 tablet (100 mg total) by mouth at bedtime.  30 tablet  3   No Known Allergies   Review of Systems  All other systems reviewed and are negative.       Objective:   Physical Exam  Nursing note and vitals reviewed. Constitutional: She is oriented to person, place, and time. She appears well-developed and well-nourished.  HENT:  Head: Normocephalic and atraumatic.  Eyes: Pupils are equal, round, and reactive to light.  Neck: Normal range of motion. Neck supple. No JVD present. No thyromegaly present.  Cardiovascular: Normal rate, regular rhythm and normal heart sounds.   No murmur heard. Pulmonary/Chest: Effort normal and breath sounds normal. She has no  wheezes. She has no rales.  Abdominal: Soft. Bowel sounds are normal.  Musculoskeletal: Normal range of motion. She exhibits no edema.  Neurological: She is alert and oriented to person, place, and time.  Skin: Skin is warm and dry.          Assessment & Plan:

## 2011-11-25 ENCOUNTER — Telehealth: Payer: Self-pay | Admitting: *Deleted

## 2011-11-25 IMAGING — CR DG KNEE COMPLETE 4+V*L*
4 series · 4 of 4 positions shown · non-contrast
Comparison: 01/09/2009 radiographs.

CLINICAL DATA: Knee pain.

LEFT KNEE - COMPLETE 4+ VIEW

[t knee ap left]
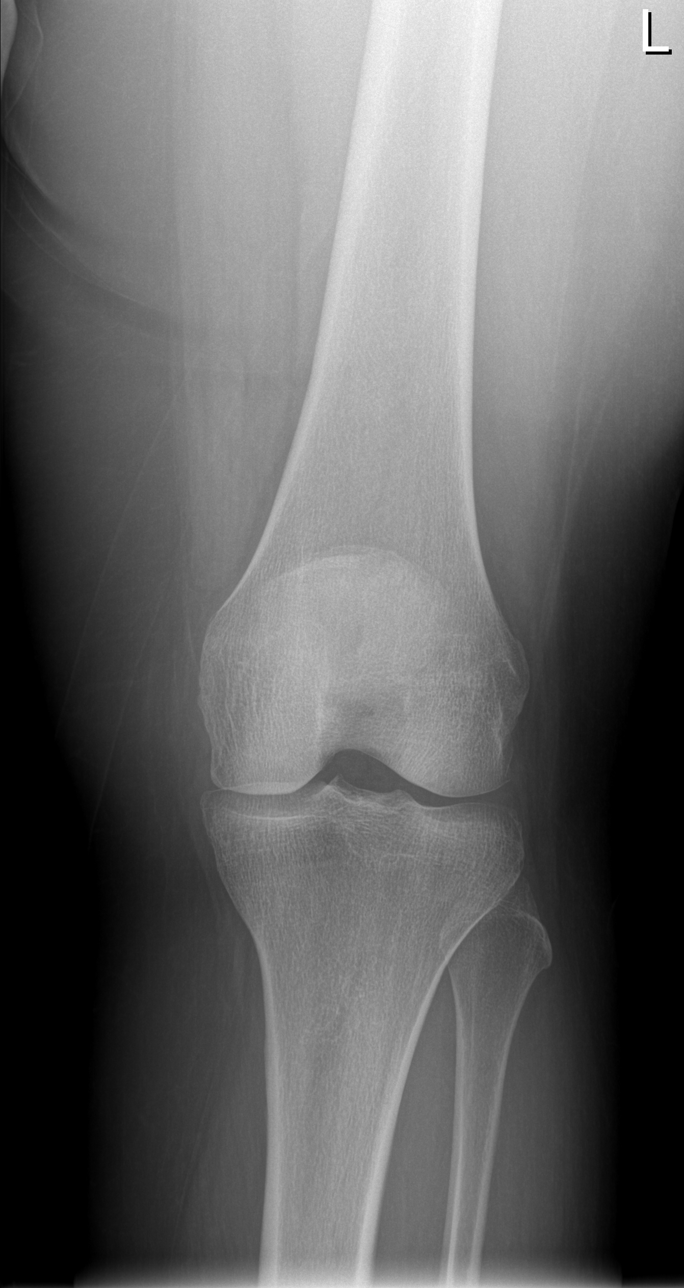

[t knee oblique left (1 of 2)]
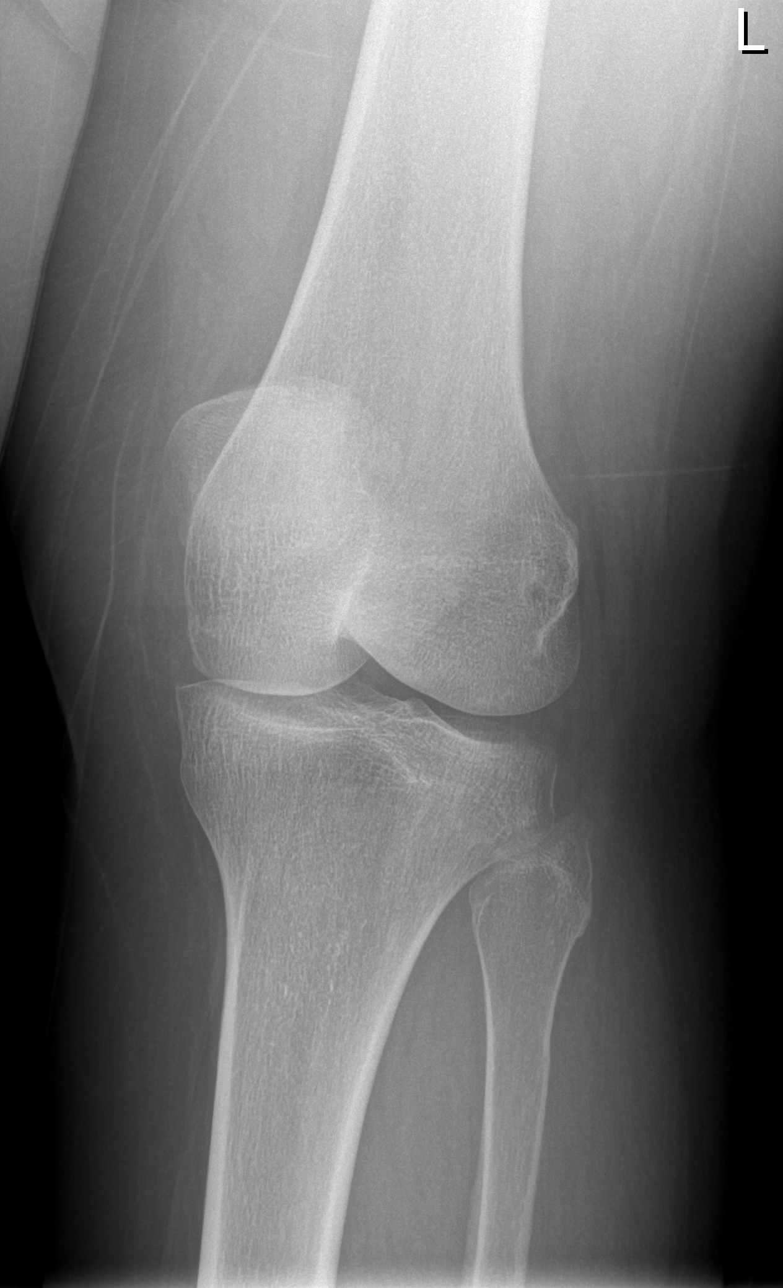

[t knee oblique left (2 of 2)]
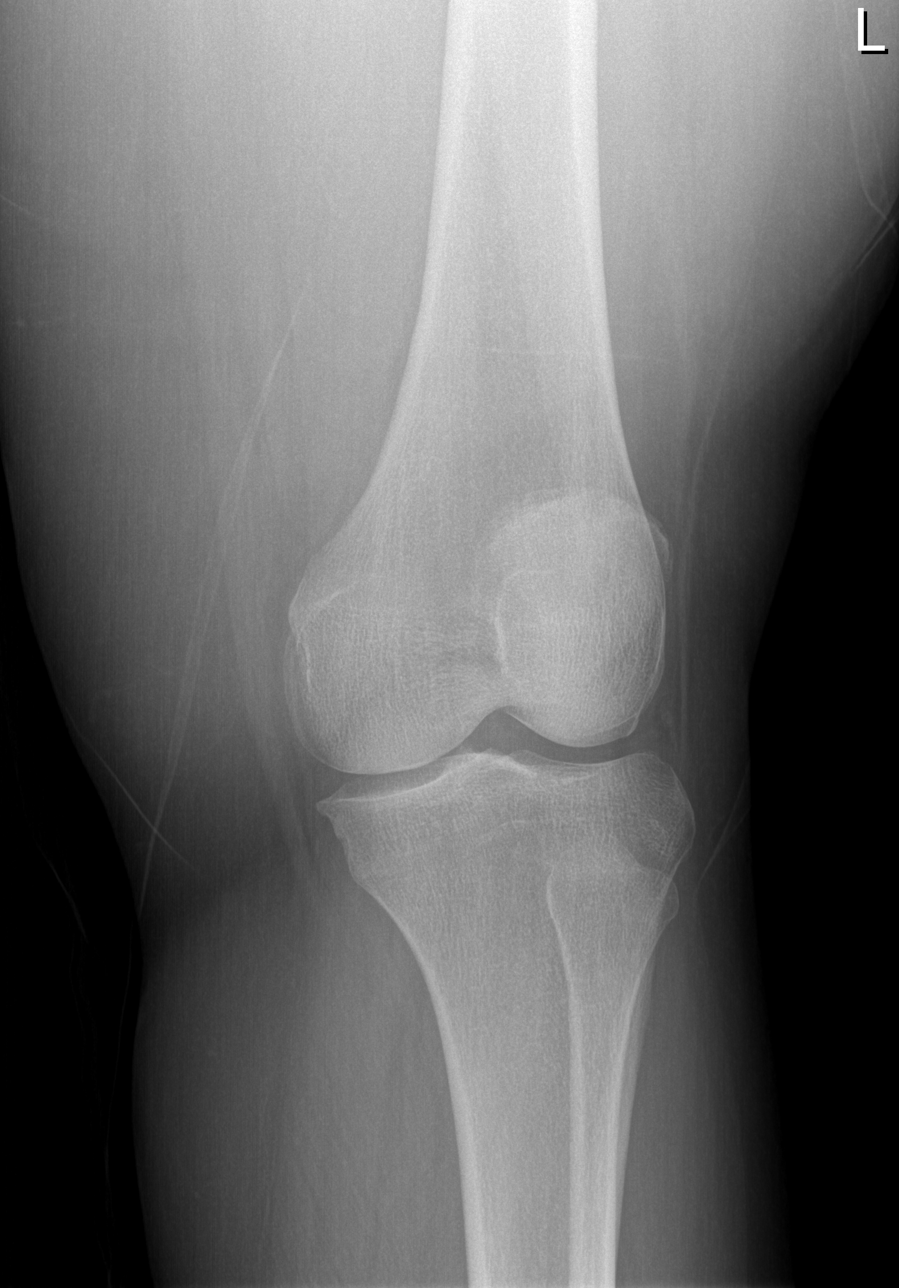

[t knee lat left]
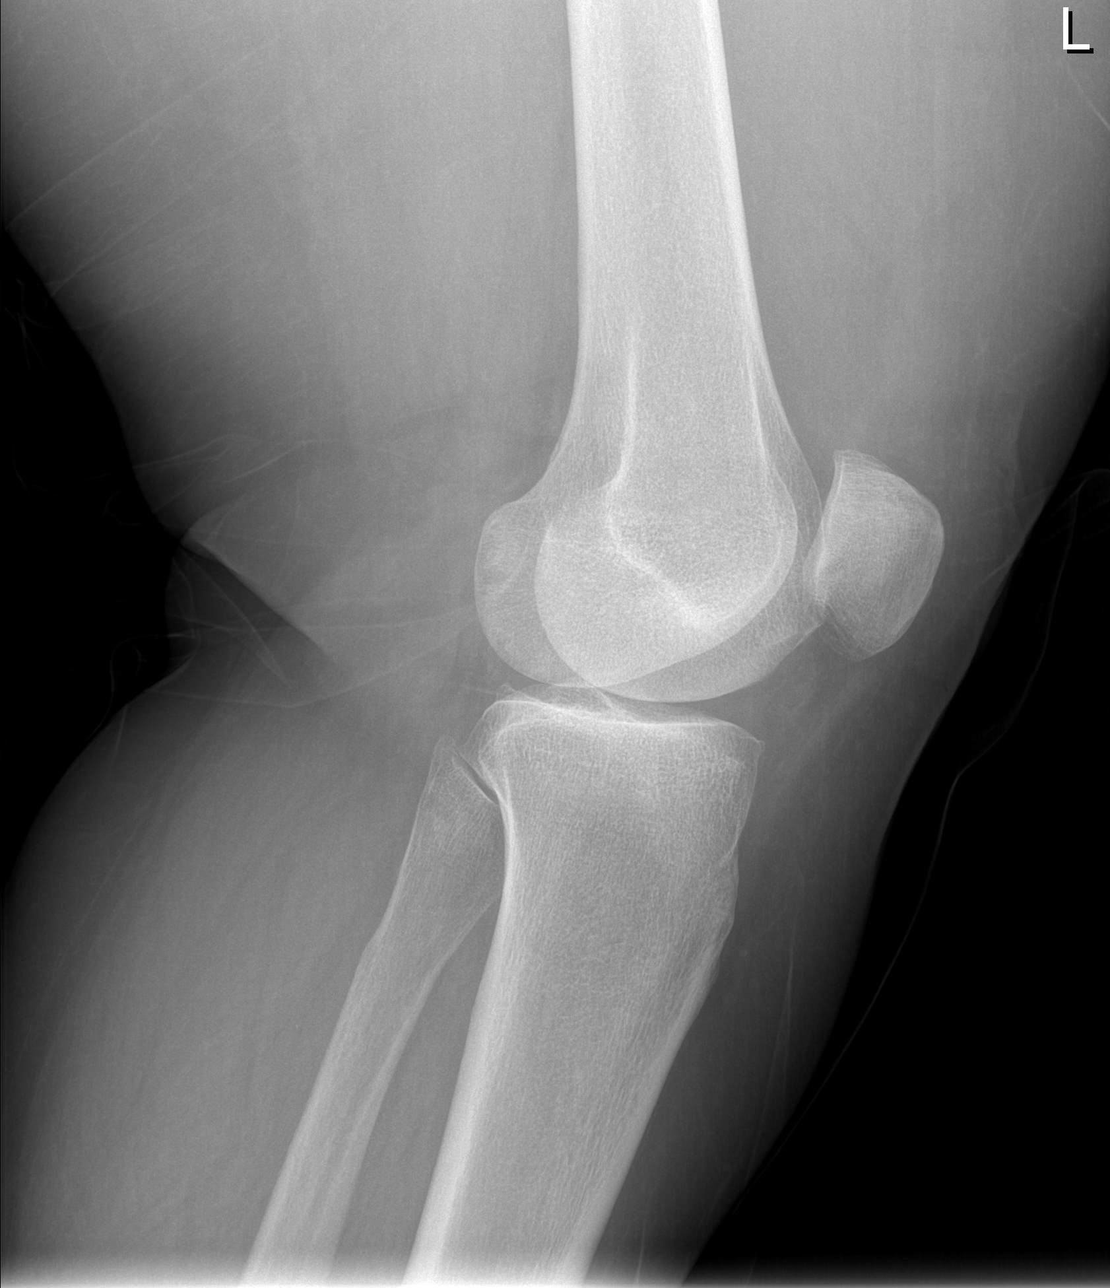

[4 of 4 positions shown; findings below may reference images not displayed]

FINDINGS: Mild tricompartmental degenerative changes are stable.
There is no evidence of acute fracture, dislocation or knee joint
effusion.  The mineralization and alignment are normal.
IMPRESSION: Stable mild tricompartmental degenerative changes.  No acute
osseous findings.

## 2011-11-25 NOTE — Telephone Encounter (Signed)
Called [phone] Abilify approved 11/23/11 - 11/22/12. Pharmacy and pt aware. Stanton Kidney Shawnte Demarest RN 11/25/11 10:30AM

## 2011-12-02 ENCOUNTER — Ambulatory Visit (INDEPENDENT_AMBULATORY_CARE_PROVIDER_SITE_OTHER): Payer: Medicaid Other | Admitting: Internal Medicine

## 2011-12-02 ENCOUNTER — Encounter: Payer: Self-pay | Admitting: Internal Medicine

## 2011-12-02 DIAGNOSIS — I10 Essential (primary) hypertension: Secondary | ICD-10-CM

## 2011-12-02 DIAGNOSIS — M722 Plantar fascial fibromatosis: Secondary | ICD-10-CM

## 2011-12-02 DIAGNOSIS — E1149 Type 2 diabetes mellitus with other diabetic neurological complication: Secondary | ICD-10-CM

## 2011-12-02 DIAGNOSIS — M79609 Pain in unspecified limb: Secondary | ICD-10-CM

## 2011-12-02 DIAGNOSIS — G894 Chronic pain syndrome: Secondary | ICD-10-CM

## 2011-12-02 DIAGNOSIS — M79642 Pain in left hand: Secondary | ICD-10-CM

## 2011-12-02 MED ORDER — HYDROCODONE-ACETAMINOPHEN 7.5-500 MG PO TABS: 1.0000 | ORAL_TABLET | Freq: Three times a day (TID) | ORAL | Status: DC

## 2011-12-02 MED ORDER — LISINOPRIL-HYDROCHLOROTHIAZIDE 10-12.5 MG PO TABS: 1.0000 | ORAL_TABLET | Freq: Every day | ORAL | Status: DC

## 2011-12-02 NOTE — Assessment & Plan Note (Signed)
Likely contributing to her chronic pain. Continue Neurontin along with tramadol and Vicodin.

## 2011-12-02 NOTE — Progress Notes (Signed)
Lisinopril/HCTZ rx called to Jonny Ruiz at [facility].

## 2011-12-02 NOTE — Assessment & Plan Note (Signed)
Lab Results  Component Value Date   NA 135 06/11/2010   K 4.1 06/11/2010   CL 102 06/11/2010   CO2 26 06/11/2010   BUN 13 06/11/2010   CREATININE 0.92 06/11/2010    BP Readings from Last 3 Encounters:  12/02/11 181/108  11/17/11 157/95  11/04/11 182/120    Assessment: Hypertension control:  severely elevated  Progress toward goals:  deteriorated Barriers to meeting goals:  nonadherence to medications  Plan: Hypertension treatment:  continue current medications . I will refill the prescription of lisinopril/HCTZ to her pharmacy. She will call the pharmacy and see if she can get the prescription as soon as possible. She will call the clinic for any further confusion or questions.

## 2011-12-02 NOTE — Patient Instructions (Signed)
Please make a followup appointment in 2-3 weeks.  We will reassess your pain. Don't forget to bring your medicines with you.  Take all medications regularly.  I did resend the prescription for your new blood pressure pill- lisinopril/HCTZ-10/12.5 mg daily again today. Call the pharmacy and see if they can send it to you as soon as possible.

## 2011-12-02 NOTE — Assessment & Plan Note (Addendum)
She has an appointment coming up with pain clinic on 12/15/2011.  From patient's description of her pain getting worse and After discussing this with Dr. Phillips Odor in detail, it seems like she is not getting enough pain medication. That is one of the explanations for her running out of her Vicodin earlier.  I would recommend reviewing the notes from pain clinic and see if any further recommendations- if not, it would be a better option to add long-acting baseline pain medication and give Vicodin as needed- then keep on increasing the dose of Vicodin. This was recommended by Dr. Phillips Odor- as per her note on 11/09/2011 and also a discussion with her today. Patient will do to come back in 2 weeks to reassess her pain and see if she needs to be started on long acting pain medication along with when necessary Vicodin.  After patient left, I reviewed the notes from pain Center- which are not in Epic but in the E-chart. Last note from 10/04/2011 says that patient is a nonnarcotic pain management- along with physical therapy and steroid injections. I discussed this with Dr. Phillips Odor and decided to let the pain clinic no about her getting narcotics from our clinic since may 2012 and will send her clinic notes to pain Center.  I will refill her Vicodin 90 tablets today without any refills and she will bring the pill bottles during next visit for a pill count. Epic did not allow me 2 print prescription for Vicodin for any computers and so I gave her written prescription which will be scanned in the system.

## 2011-12-02 NOTE — Assessment & Plan Note (Signed)
She's going to have surgery for left hand pain next month. She had carpal release for right previously.

## 2011-12-02 NOTE — Progress Notes (Signed)
  Subjective:    Patient ID: [name], female    DOB: March 29, 1961, 50 y.o.   MRN: [id number]  HPI [name] is a pleasant 50 year old woman with past history of DM 2, peripheral neuropathy, chronic pain syndrome, hypertension who comes the clinic for followup visit for blood pressure recheck and left arm pain.  Her blood pressure today is 181/108 which is not at all near goal. She was supposed to get started on lisinopril/HCTZ- which was prescribed on 11/09/2011 by Dr. Berlinda Last- but she did not start it yet, as she says that the pharmacy she gets from did not send her the bottle.   She also complains of left arm pain which is going on for 6-8 months now- for which she is going to have an MRI done in pain clinic during next visit.  She also complains of chronic neck pain, right hip pain for which she had a steroid injections and mid to low back pain- of which are going on for 7-8 years now.  She is on tramadol, Vicodin, gabapentin for pain. She says that Vicodin and tramadol helps her the best and she is been taking them for about a year now. She was not able to get out of her bed before she started Vicodin. She is not able to do her activities of daily living much more easily than before.  She was seen 2 weeks before in our clinic- when she was out of her Vicodin as she did more pills due to worsening pain- but she was not given the refill because he was not due until today.   Her previous UDS in November 2012 showed positive for tramadol and negative for hydrocodone- but she says that she ran out of Vicodin before she got a UDS and so she was not taking it.  Dr. Phillips Odor has a documentation not on 11/09/2011 to help management of chronic pain for this patient. I discussed about pain management for this patient with Dr. Phillips Odor before letting the patient go home today.  She denies any fever, chills, nausea vomiting, chest pain, short of breath, abdominal pain, diarrhea.     Review of  Systems     as per history of present illness, all other systems reviewed and negative.  Objective:   Physical Exam  General: resting in bed HEENT: PERRL, EOMI, no scleral icterus Cardiac: S1, S2, RRR, no rubs, murmurs or gallops Pulm: clear to auscultation bilaterally, moving normal volumes of air Abd: soft, nontender, nondistended, BS present Ext: Left arm pain, no significant sensation changes in left arm. Decrease in range of motion of neck and spine due to pain. Neuro: alert and oriented X3, cranial nerves II-XII grossly intact       Assessment & Plan:

## 2011-12-07 HISTORY — PX: CARPAL TUNNEL RELEASE: SHX101

## 2011-12-15 ENCOUNTER — Encounter
Payer: Medicaid Other | Attending: Physical Medicine and Rehabilitation | Admitting: Physical Medicine and Rehabilitation

## 2011-12-15 DIAGNOSIS — M25569 Pain in unspecified knee: Secondary | ICD-10-CM | POA: Insufficient documentation

## 2011-12-15 DIAGNOSIS — M76899 Other specified enthesopathies of unspecified lower limb, excluding foot: Secondary | ICD-10-CM

## 2011-12-15 DIAGNOSIS — M546 Pain in thoracic spine: Secondary | ICD-10-CM

## 2011-12-15 DIAGNOSIS — M1710 Unilateral primary osteoarthritis, unspecified knee: Secondary | ICD-10-CM | POA: Insufficient documentation

## 2011-12-15 DIAGNOSIS — M47817 Spondylosis without myelopathy or radiculopathy, lumbosacral region: Secondary | ICD-10-CM | POA: Insufficient documentation

## 2011-12-15 DIAGNOSIS — M542 Cervicalgia: Secondary | ICD-10-CM

## 2011-12-15 DIAGNOSIS — M79609 Pain in unspecified limb: Secondary | ICD-10-CM | POA: Insufficient documentation

## 2011-12-15 DIAGNOSIS — M25519 Pain in unspecified shoulder: Secondary | ICD-10-CM | POA: Insufficient documentation

## 2011-12-15 DIAGNOSIS — M545 Low back pain, unspecified: Secondary | ICD-10-CM | POA: Insufficient documentation

## 2011-12-15 DIAGNOSIS — G894 Chronic pain syndrome: Secondary | ICD-10-CM

## 2011-12-15 DIAGNOSIS — M25559 Pain in unspecified hip: Secondary | ICD-10-CM | POA: Insufficient documentation

## 2011-12-16 ENCOUNTER — Other Ambulatory Visit: Payer: Self-pay | Admitting: *Deleted

## 2011-12-16 DIAGNOSIS — M722 Plantar fascial fibromatosis: Secondary | ICD-10-CM

## 2011-12-16 DIAGNOSIS — G8929 Other chronic pain: Secondary | ICD-10-CM

## 2011-12-16 DIAGNOSIS — G894 Chronic pain syndrome: Secondary | ICD-10-CM

## 2011-12-16 NOTE — Assessment & Plan Note (Signed)
Ms. Deneene Tarver is a pleasant 51 year old, African American woman who has multiple pain complaints.  She has cervicalgia, left upper extremity pain, bilateral knee pain and foot pain as well as low back pain and lateral hip pain.  She is back in today for brief recheck.  She recently has undergone a left carpal tunnel surgery by Dr. Amanda Pea, December 10, 2011.  She has a follow up appointment with him in a couple of days.  She continues to have complaints of left upper extremity pain.  Pain radiating from her neck to her left shoulder and upper arm.  Average pain is about an 8 on a scale of 10.  Sleep is poor.  Pain is rather constant.  It seems to improve somewhat with heat and medication. She gets fair relief with current meds.  FUNCTIONAL STATUS:  She can walk about 5 minutes.  She is able to drive. She has difficulty with stairs due to her knees.  She is independent with self-care, but currently needs assistance because her left upper extremity is in a sling after the carpal tunnel surgery.  REVIEW OF SYSTEMS:  Admits to numbness, tingling, trouble walking, depression, anxiety.  Denies suicidal ideation.  Denies problems with bowel or bladder.  The rest of her problems, I asked her to follow up with primary care for issues related to night sweats, weight loss, and low blood sugars.  PAST MEDICAL, SOCIAL, AND FAMILY HISTORY:  Otherwise unchanged.  She smokes half pack of cigarettes a day, cautioned against this.  PHYSICAL EXAMINATION:  Blood pressure is 113/78, pulse 86, respirations 16, 96% saturated on room air.  She is 71 inches tall, weighing 285 pounds.  Well-developed obese woman who does not appear in any distress. She is oriented x3.  Speech is clear.  Affect is bright.  She is alert, cooperative, and pleasant.  Follows commands without difficulty. Answers the questions appropriately.  Cranial nerves and coordination are intact.  Her left upper extremity is in a  sling and a thick wrapped bandage.  She has well-preserved strength in right upper extremity and bilateral lower extremities.  The left upper extremity is not evaluated due to recent surgery.  She transitions easily from sitting to standing.  Gait is nonantalgic. Tandem gait and Romberg test are performed adequately.  She has limitations in cervical range of motion, complains of pain, especially the left neck radiating to the left upper extremity and shoulder with movement.  She is wearing a soft cervical orthosis today.  Reflexes are 2+ in the upper extremity, diminished in the lower extremities, tenderness over the trochanters are noted as well.  IMPRESSION: 1. Cervicalgia with radiation to the left shoulder and left upper arm. 2. Status post left carpal tunnel surgery. 3. Bilateral knee osteoarthritis. 4. History of leg numbness with history of diabetic neuropathy. 5. Lumbago with known lumbar spondylosis. 6. History of substance abuse documented in the chart on July 14, 2011.  PLAN:  She has requested a refill on some Voltaren gel.  She has been using that on her hands as well as her lateral elbow and knees.  We will give her a prescription of Voltaren gel 1% 4 g q.i.d. as directed with 2 refills.  I will also encourage her to maintain contact with Dr. Amanda Pea during her postoperative period with her left carpal tunnel surgery.  I will see her back in a month.  We will anticipate further cervical workup considering MRI.  She is in  agreement with this.  She would like to get through this postop period with her left hand prior to further work up.  I have answered all her questions.  She is comfortable with our plan.     Brantley Stage, M.D. Electronically Signed    DMK/MedQ D:  12/15/2011 10:41:49  T:  12/16/2011 03:56:15  Job #:  [id number]

## 2011-12-17 ENCOUNTER — Ambulatory Visit (INDEPENDENT_AMBULATORY_CARE_PROVIDER_SITE_OTHER): Payer: Medicaid Other | Admitting: Internal Medicine

## 2011-12-17 ENCOUNTER — Encounter: Payer: Self-pay | Admitting: Internal Medicine

## 2011-12-17 ENCOUNTER — Telehealth: Payer: Self-pay | Admitting: *Deleted

## 2011-12-17 VITALS — BP 121/87 | HR 93 | Temp 97.2°F | Ht 71.0 in | Wt 290.8 lb

## 2011-12-17 DIAGNOSIS — G894 Chronic pain syndrome: Secondary | ICD-10-CM

## 2011-12-17 DIAGNOSIS — M79642 Pain in left hand: Secondary | ICD-10-CM

## 2011-12-17 DIAGNOSIS — E119 Type 2 diabetes mellitus without complications: Secondary | ICD-10-CM

## 2011-12-17 DIAGNOSIS — I10 Essential (primary) hypertension: Secondary | ICD-10-CM

## 2011-12-17 DIAGNOSIS — F329 Major depressive disorder, single episode, unspecified: Secondary | ICD-10-CM

## 2011-12-17 DIAGNOSIS — M79609 Pain in unspecified limb: Secondary | ICD-10-CM

## 2011-12-17 MED ORDER — TRAZODONE HCL 100 MG PO TABS: 200.0000 mg | ORAL_TABLET | Freq: Every day | ORAL | Status: DC

## 2011-12-17 MED ORDER — HYDROCODONE-ACETAMINOPHEN 5-500 MG PO TABS: 2.0000 | ORAL_TABLET | Freq: Two times a day (BID) | ORAL | Status: DC | PRN

## 2011-12-17 MED ORDER — OXYCODONE HCL 20 MG PO TB12: 20.0000 mg | ORAL_TABLET | Freq: Two times a day (BID) | ORAL | Status: DC

## 2011-12-17 NOTE — Telephone Encounter (Signed)
Message from pt about her medications stating that she could not get her Oxycodone or the Hydrocodone.  Said that the doctor needed to do something about the problem.  Call to [facility] spoke to the Pharmacy was told that pt has received 11 med this month through Medicaid and pt will be unable to get more meds for 30 days.  Pharmacist said that the limit is 8 but she was extended 3 more.  Pt did get the Voltain Gel so all other scripts will not be filled until next month.  Attempts to call pt unable to reach her message left with female who answered phone that pt had called.Angelina Ok, RN 12/17/2011 5:01 PM

## 2011-12-17 NOTE — Assessment & Plan Note (Addendum)
Appreciate excellent visit and discussion by Dr. Allena Katz on 12/02/11. As he asked her to do, the patient did bring all her medicines and pill bottles today. The patient's pain continues to be moderately controlled with Vicodin, but she is using up to 45 mg daily, if not more. As Dr. Eliane Decree note outlines, I think we should in fact switch her to a long acting pain med.  After discussion with Dr. Coralee Pesa, we are going to switch her to oxycodone 20 mg twice a day along with Vicodin 5/500 for breakthrough pain. We will prescribe her 60 of the oxycodone, and 60 of the Vicodin. She will followup in 4 weeks, and should get a UDS at that time. I told her to stop taking her tramadol. - Oxycodone 20 twice a day - Vicodin 5/500, quantity 60 for breakthrough pain - DC'd tramadol - UDS at followup visit - Fax records to pain clinic and Dr. Pamelia Hoit

## 2011-12-17 NOTE — Telephone Encounter (Signed)
Can you refused refill and sign off.  Thanks

## 2011-12-17 NOTE — Assessment & Plan Note (Signed)
Patient was able to obtain blood pressure meds, and is taking lisinopril/HCTZ combo pill along with metoprolol. Her pressure today on recheck was 121 a bradycardia 7, which is at goal. No changes in her therapy. We will check a BMP today to ensure her electrolytes and creatinine are appropriate.

## 2011-12-17 NOTE — Progress Notes (Signed)
Notes of todays visit faxed to Dr Pamelia Hoit per Dr Abner Greenspan. Stanton Kidney Neveyah Garzon RN 12/17/11 1:20PM

## 2011-12-17 NOTE — Assessment & Plan Note (Signed)
Patient under excellent control on glipizide 10 mg and metformin 500 twice a day. She did bring her meter today which is excellent readings. She is not due for an A1c or lipids today. Her blood pressure is well controlled.

## 2011-12-17 NOTE — Progress Notes (Signed)
I agree with Dr. Wildman-Tobriner's assessment and plan. 

## 2011-12-17 NOTE — Assessment & Plan Note (Signed)
Patient continues taking Cymbalta and Abilify, along with trazodone for sleep. Discussion of her depression was not a major topic of this visit. Her mood should be reassessed at followup.

## 2011-12-17 NOTE — Telephone Encounter (Signed)
These medicines were changed at today's visit and should not be refilled

## 2011-12-17 NOTE — Progress Notes (Signed)
Subjective:     Patient ID: [name], female   DOB: [birth year], 51 y.o.   MRN: [id number]  HPI Patient is a very pleasant 51 year old woman with history of diabetes, hypertension, chronic pain, who presents for followup. Patient underwent left carpal tunnel surgery 1 week ago with Dr. Bryon Lions, who did her right carpal tunnel release roughly 5 years ago. The surgery had no consultations and went smoothly. The patient is here today with her arm in a sling and her left wrist bandaged. She says she is recovering well.  She was here on 12/27 for management of her chronic pain, which she has had for 6-7 years. The patient complains of continued neck, back, hip, feet pain. Last visit she was given 90 Vicodin 7.5 MG, and she has used all but 2 of them today, 15 days later. She says that they do help, but they don't last as long as they use to. She is able to be mobile and feels relatively well. She takes tramadol with Vicodin as well.  She continues to take Abilify and Cymbalta, as well as trazodone 200 mg for sleep. She was seen this week at the pain clinic [facility] who does not prescribe her narcotics. They're working up her chronic left arm pain and considering a cervical spine MRI. She has gone one hip injection the past, but there were no injections in the near future.  I will try to fax today's visit over to their office.  Patient was able to get her blood pressure medicines, which she is taking appropriately. She is also taking metformin and glipizide with no signs of hypoglycemia.  Review of Systems As per history of present illness    Objective:   Physical Exam Gen: NAD, EOMI MSK: L arm in sling, L wrist bandaged Neuro: ambulating without difficulty    Assessment:         Plan:

## 2011-12-17 NOTE — Assessment & Plan Note (Signed)
Status post left carpal toe release one week ago. No complications, patient doing well. Left arm currently in sling with left wrist bandaged. Has followup with Dr. Bryon Lions today.

## 2011-12-18 LAB — BASIC METABOLIC PANEL WITH GFR
CO2: 27 mEq/L (ref 19–32)
Chloride: 103 mEq/L (ref 96–112)
Creat: 0.85 mg/dL (ref 0.50–1.10)
Glucose, Bld: 46 mg/dL — ABNORMAL LOW (ref 70–99)

## 2011-12-20 NOTE — Progress Notes (Signed)
I agree with Dr. Wildman-Tobriner's assessment and plan. 

## 2011-12-21 ENCOUNTER — Telehealth: Payer: Self-pay | Admitting: *Deleted

## 2011-12-21 NOTE — Telephone Encounter (Signed)
Called [phone] - Dr Abner Greenspan needs to complete paperwork. Stanton Kidney Farran Amsden RN 12/21/11 4PM

## 2011-12-23 ENCOUNTER — Other Ambulatory Visit: Payer: Self-pay | Admitting: Internal Medicine

## 2011-12-23 MED ORDER — MORPHINE SULFATE ER 40 MG PO CP24: 1.0000 | ORAL_CAPSULE | Freq: Every day | ORAL | Status: DC

## 2011-12-23 NOTE — Progress Notes (Signed)
Insurance would not cover Oxycontin but offered 4 possible substitutions. Therefore I have written for Kadian (MS Contin) 40mg  q day. #30 given.

## 2012-01-12 ENCOUNTER — Encounter: Payer: Medicaid Other | Attending: Physical Medicine and Rehabilitation | Admitting: Neurosurgery

## 2012-01-12 DIAGNOSIS — M1710 Unilateral primary osteoarthritis, unspecified knee: Secondary | ICD-10-CM

## 2012-01-12 DIAGNOSIS — M542 Cervicalgia: Secondary | ICD-10-CM | POA: Insufficient documentation

## 2012-01-12 DIAGNOSIS — M545 Low back pain, unspecified: Secondary | ICD-10-CM | POA: Insufficient documentation

## 2012-01-12 DIAGNOSIS — M25519 Pain in unspecified shoulder: Secondary | ICD-10-CM | POA: Insufficient documentation

## 2012-01-12 NOTE — Assessment & Plan Note (Signed)
This patient is followed for multiple pain complaints, especially her neck and shoulder.  She rates her average pain at 8/10.  General activity level is 9.  Pain is same 24 hours a day.  Sleep patterns are poor.  Walking, bending, standing aggravate.  Rest and medication help. She uses a walker.  She does drive.  She does not climb steps. Functionally, she is unemployed.  She needs help with ADLs and household duties.  REVIEW OF SYSTEMS:  Notable for difficulties described above as well as some night sweats, weight loss, tingling, trouble walking, depression. No suicidal thoughts.  PAST MEDICAL HISTORY:  Unchanged.  SOCIAL HISTORY:  Unchanged.  FAMILY HISTORY:  Unchanged.  PHYSICAL EXAMINATION:  VITAL SIGNS:  Blood pressure is 140/86, pulse 76, respirations 14, O2 sat is 97 on room air. NEUROLOGIC:  Motor strength is somewhat diminished in the upper extremities, intact in the lower extremities.  Sensation is intact. Constitutionally, she is obese.  She is alert and oriented x3.  She uses a rolling walker.  IMPRESSION: 1. Cervicalgia. 2. Status post left carpal tunnel. 3. Bilateral knee osteoarthritis. 4. Lumbago.  PLAN:  Per the patient's request and Dr. Leretha Dykes last note, we are going to obtain an MRI of the cervical spine without gadolinium.  She will come back in a month to review that with Dr. Pamelia Hoit.  No prescriptions were needed or given.  Questions were encouraged and answered.     Natalie Leclaire L. Blima Dessert Electronically Signed    RLW/MedQ D:  01/12/2012 08:33:15  T:  01/12/2012 10:18:13  Job #:  [id number]

## 2012-01-12 NOTE — Progress Notes (Signed)
Addended by: Remus Blake on: 01/12/2012 11:58 AM   Modules accepted: Orders

## 2012-01-14 ENCOUNTER — Other Ambulatory Visit: Payer: Self-pay | Admitting: Physical Medicine and Rehabilitation

## 2012-01-14 DIAGNOSIS — M25512 Pain in left shoulder: Secondary | ICD-10-CM

## 2012-01-18 ENCOUNTER — Encounter: Payer: Self-pay | Admitting: Internal Medicine

## 2012-01-18 ENCOUNTER — Ambulatory Visit (INDEPENDENT_AMBULATORY_CARE_PROVIDER_SITE_OTHER): Payer: Medicaid Other | Admitting: Internal Medicine

## 2012-01-18 VITALS — BP 134/104 | HR 81 | Temp 97.2°F | Ht 71.0 in | Wt 290.4 lb

## 2012-01-18 DIAGNOSIS — G8929 Other chronic pain: Secondary | ICD-10-CM

## 2012-01-18 DIAGNOSIS — I10 Essential (primary) hypertension: Secondary | ICD-10-CM

## 2012-01-18 DIAGNOSIS — Z79899 Other long term (current) drug therapy: Secondary | ICD-10-CM

## 2012-01-18 DIAGNOSIS — F329 Major depressive disorder, single episode, unspecified: Secondary | ICD-10-CM

## 2012-01-18 DIAGNOSIS — E119 Type 2 diabetes mellitus without complications: Secondary | ICD-10-CM

## 2012-01-18 DIAGNOSIS — F3289 Other specified depressive episodes: Secondary | ICD-10-CM

## 2012-01-18 DIAGNOSIS — G4700 Insomnia, unspecified: Secondary | ICD-10-CM

## 2012-01-18 DIAGNOSIS — G894 Chronic pain syndrome: Secondary | ICD-10-CM

## 2012-01-18 LAB — POCT GLYCOSYLATED HEMOGLOBIN (HGB A1C): Hemoglobin A1C: 6.1

## 2012-01-18 LAB — GLUCOSE, CAPILLARY: Glucose-Capillary: 67 mg/dL — ABNORMAL LOW (ref 70–99)

## 2012-01-18 MED ORDER — MORPHINE SULFATE ER 40 MG PO CP24: 1.0000 | ORAL_CAPSULE | Freq: Every day | ORAL | Status: DC

## 2012-01-18 MED ORDER — HYDROCODONE-ACETAMINOPHEN 7.5-750 MG PO TABS: 1.0000 | ORAL_TABLET | Freq: Two times a day (BID) | ORAL | Status: DC | PRN

## 2012-01-18 MED ORDER — METOPROLOL TARTRATE 25 MG PO TABS: 25.0000 mg | ORAL_TABLET | Freq: Two times a day (BID) | ORAL | Status: DC

## 2012-01-18 NOTE — Assessment & Plan Note (Signed)
A1c today 6.1 on meformin 500 bid and glipizide 10mg .  Will con't current regimen.

## 2012-01-18 NOTE — Progress Notes (Signed)
Subjective:     Patient ID: [name], female   DOB: [birth year], 51 y.o.   MRN: [id number]  HPI Patient is a 51 year old woman with a history of DM, depression, hypertension,and  chronic pain who presents for followup.  Pain: Patient continues to have lower back pain, neck pain, and is scheduled to have an MRI next week as part of her management and her pain clinic. She is status post left carpal tunnel release, doing well with decreased numbness and tingling of that hand.  She got an injection on her R lateral foot through her orthopedist as well.  Last visit she was started on kadian 40mg  q daily, but has been taking it twice daily.  She was unaware of the dosing.  She says the vicodin she has for breakthrough pain is not sufficient and that she wants to go back to her 7.5mg  pills.  BP: says it has been running a little high at home  Mood: good, though she has been worried a/b her son who carries a dx of schizophrenia and was in the [facility] ED at the time of our appt.  Review of Systems As per HPI    Objective:   Physical Exam Gen: NAD, neck brace in place MSK: good ROM of all extremeties, ambulating w/o difficulties.    Assessment:         Plan:

## 2012-01-18 NOTE — Assessment & Plan Note (Signed)
BP's running high at home and in clinic.  Was taking 1/2 metop pills, so we increased to full pill. - f/u bp at next visit, ask a/b any orthostatic sx

## 2012-01-18 NOTE — Assessment & Plan Note (Addendum)
Con't cymbalta and abilify, trazodone for sleep.  Endorses stable, good moods.

## 2012-01-18 NOTE — Assessment & Plan Note (Signed)
Last visit had tried to start oxycodone, but insurance would not cover.  Instead, started morphine 40mg  q daily, but pt was taking bid.  I explained the proper dosing to the patient this visit.  She had vicodin for breakthough pain, but she complained it was not sufficient.  I changed her vicodin to 7.5mg  pills today. - refilled morphine 40mg  daily #30, put two refills in pain contract - refilled vicodin 7.5/750 #60, put two refills in pain contract - UDS today - f/u pain at next visit

## 2012-01-19 LAB — PRESCRIPTION ABUSE MONITORING 15P, URINE
Barbiturate Screen, Urine: NEGATIVE ng/mL
Buprenorphine, Urine: NEGATIVE ng/mL
Cocaine Metabolites: NEGATIVE ng/mL
Creatinine, Urine: 178.66 mg/dL
Fentanyl, Ur: NEGATIVE ng/mL
Methadone Screen, Urine: NEGATIVE ng/mL
Opiate Screen, Urine: POSITIVE ng/mL — ABNORMAL HIGH
Oxycodone Screen, Ur: NEGATIVE ng/mL
Propoxyphene: NEGATIVE ng/mL
Zolpidem, Urine: NEGATIVE ng/mL

## 2012-01-20 ENCOUNTER — Ambulatory Visit
Admission: RE | Admit: 2012-01-20 | Discharge: 2012-01-20 | Disposition: A | Discharge status: Home / Self Care | Payer: Medicaid Other | Source: Ambulatory Visit | Attending: Physical Medicine and Rehabilitation | Admitting: Physical Medicine and Rehabilitation

## 2012-01-20 ENCOUNTER — Other Ambulatory Visit: Payer: Self-pay | Admitting: Internal Medicine

## 2012-01-20 DIAGNOSIS — M25512 Pain in left shoulder: Secondary | ICD-10-CM

## 2012-01-20 LAB — OPIATES/OPIOIDS (LC/MS-MS)
Codeine Urine: NEGATIVE NG/ML
Heroin (6-AM), UR: NEGATIVE NG/ML
Hydrocodone: NEGATIVE NG/ML
Hydromorphone: NEGATIVE NG/ML
Morphine Urine: 6080 NG/ML — ABNORMAL HIGH

## 2012-02-08 ENCOUNTER — Encounter: Payer: Self-pay | Admitting: *Deleted

## 2012-02-08 ENCOUNTER — Other Ambulatory Visit: Payer: Self-pay | Admitting: *Deleted

## 2012-02-08 DIAGNOSIS — G8929 Other chronic pain: Secondary | ICD-10-CM

## 2012-02-08 MED ORDER — HYDROCODONE-ACETAMINOPHEN 7.5-750 MG PO TABS: 1.0000 | ORAL_TABLET | Freq: Two times a day (BID) | ORAL | Status: DC | PRN

## 2012-02-08 NOTE — Progress Notes (Signed)
[name]- What do you make of this?  While I generally try to do what patients ask for, I'm not so sure about this one...  Thoughts? Romeo Apple

## 2012-02-08 NOTE — Progress Notes (Signed)
Pt presents wanting script for kadian changed from 40mg  daily to 20mg  two daily, she states the 20mg  caps work better than the 40mg  caps???

## 2012-02-09 NOTE — Progress Notes (Signed)
There is no basis for this request if she is taking the Kadian once a day.  Therefore, the request will be denied.  It is not unreasonable to take the Kadian as 20 mg PO BID but this is not what the patient is asking to do.  Such a change would require a discussion between the patient and her PCP at a visit in the clinic where she could be examined to assure such a change in the dosing schedule is appropriate.  This is not something that can be change without such an encounter.

## 2012-02-09 NOTE — Telephone Encounter (Signed)
Called to pharm 

## 2012-02-11 ENCOUNTER — Encounter: Payer: Self-pay | Admitting: Physical Medicine and Rehabilitation

## 2012-02-11 ENCOUNTER — Encounter
Payer: Medicaid Other | Attending: Physical Medicine and Rehabilitation | Admitting: Physical Medicine and Rehabilitation

## 2012-02-11 DIAGNOSIS — M79609 Pain in unspecified limb: Secondary | ICD-10-CM | POA: Insufficient documentation

## 2012-02-11 DIAGNOSIS — M76899 Other specified enthesopathies of unspecified lower limb, excluding foot: Secondary | ICD-10-CM

## 2012-02-11 DIAGNOSIS — M25519 Pain in unspecified shoulder: Secondary | ICD-10-CM | POA: Insufficient documentation

## 2012-02-11 DIAGNOSIS — M25569 Pain in unspecified knee: Secondary | ICD-10-CM | POA: Insufficient documentation

## 2012-02-11 DIAGNOSIS — M5090 Cervical disc disorder, unspecified, unspecified cervical region: Secondary | ICD-10-CM | POA: Insufficient documentation

## 2012-02-11 DIAGNOSIS — M545 Low back pain, unspecified: Secondary | ICD-10-CM | POA: Insufficient documentation

## 2012-02-11 DIAGNOSIS — M7061 Trochanteric bursitis, right hip: Secondary | ICD-10-CM | POA: Insufficient documentation

## 2012-02-11 DIAGNOSIS — M1710 Unilateral primary osteoarthritis, unspecified knee: Secondary | ICD-10-CM | POA: Insufficient documentation

## 2012-02-11 DIAGNOSIS — R208 Other disturbances of skin sensation: Secondary | ICD-10-CM | POA: Insufficient documentation

## 2012-02-11 DIAGNOSIS — R209 Unspecified disturbances of skin sensation: Secondary | ICD-10-CM

## 2012-02-11 DIAGNOSIS — M25559 Pain in unspecified hip: Secondary | ICD-10-CM | POA: Insufficient documentation

## 2012-02-11 DIAGNOSIS — M542 Cervicalgia: Secondary | ICD-10-CM | POA: Insufficient documentation

## 2012-02-11 DIAGNOSIS — M47817 Spondylosis without myelopathy or radiculopathy, lumbosacral region: Secondary | ICD-10-CM | POA: Insufficient documentation

## 2012-02-11 NOTE — Progress Notes (Signed)
Requesting injection in rt hip.

## 2012-02-11 NOTE — Patient Instructions (Signed)
Take your gabapentin 4 times a day instead of 5 times a day.  We will schedule an injection for your right hip next month.

## 2012-02-11 NOTE — Progress Notes (Signed)
Subjective:    Patient ID: [name], female    DOB: 1961/06/15, 51 y.o.   MRN: [id number]  HPI The patient is a 51 year old woman who is followed here in our center for pain and rehabilitative medicine for multiple chronic pain problems. These included neck pain, bilateral foot burning and tingling, knee pain, low back pain, and hand pain as well his right hip pain.  she reports no new medical problems since he was last seen. She continues to use home health assistance for some activities of daily living.  Since increase in gabapentin last month she is noted some mild dizziness.   Review of Systems  Constitutional: Positive for diaphoresis (night sweats).  HENT: Positive for neck pain.   Eyes: Negative.   Respiratory: Positive for cough.   Cardiovascular: Negative.   Gastrointestinal: Negative.   Genitourinary: Negative.   Musculoskeletal: Positive for myalgias, back pain and arthralgias.  Skin: Negative.   Neurological: Positive for dizziness (more often lately, not r/t meals/blood sugar) and syncope (assoc w/ dizziness, feels she might faint).  Hematological: Negative.   Psychiatric/Behavioral:       Treating for PTSD  Pain Inventory Average Pain 8 Pain Right Now 9 My pain is sharp, stabbing, tingling and aching  In the last 24 hours, has pain interfered with the following? General activity 8 Relation with others 9 Enjoyment of life 10 What TIME of day is your pain at its worst? morning, daytime,  & at night Sleep (in general) Fair  Pain is worse with: walking, bending, standing and some activites Pain improves with: heat/ice, medication and TENS Relief from Meds: 8  Mobility walk with assistance use a walker ability to climb steps?  no do you drive?  yes transfers alone  Function not employed: date last employed 2008 disabled: date disabled pending I need assistance with the following:  dressing, bathing, meal prep, household duties and  shopping  Neuro/Psych numbness tingling trouble walking dizziness depression  Prior Studies Any changes since last visit?  no CT/MRI  Physicians involved in your care Primary care Dr Aldine Contes, [facility] Psychiatrist [facility]         Objective:   Physical Exam  I the patient is a moderately obese woman who does not appear in any distress he is oriented x3 her speech is clear her affect is bright she's alert cooperative and pleasant she follows commands without difficulty and answers the questions appropriately  Her cranial nerves coordination are intact  Her reflexes are 2+ in the upper extremities 1+ in the lower extremities and symmetric no abnormal tone clonus or tremors are noted  Motor strength is good in both upper and lower extremity is about obvious focal deficit  She reports decreased sensation in hands and feet  She transitions from sitting to standing slowly she uses a walker for ambulation  She has decreased range of motion in cervical spine in all planes. Shoulder range of motion is significantly diminished especially with an abduction and internal rotation.  She has crepitus with flexion extension at both knees and no obvious effusion is appreciated. Joint line tenderness especially medial is noted  She has tenderness over the right trochanter. Hip range of motion is fairly well preserves without growing or posterior hip pain.      Assessment & Plan:  1. Neck pain with radiation to the shoulders: recent cervical MRI is reviewed in electronic medical record. Multilevel cervical spondylosis is appreciated.  2.  Right hip trochanteric bursitis. This was injected 11/03/2011. This improved pain approximately 40% but is now recurring.may consider reinjection in the upcoming months.patient would like to be scheduled for reinjection next month.  3. Chronic burning foot pain x6-7 years consistent with diabetic peripheral  neuropathy.  4. Bilateral knee osteoarthritis.  5. Bilateral hand achiness. She does have a history of left carpal tunnel surgery    Plan: Continue voltaren gel to use as directed. Patient has noted some dizziness with a slight increase in gabapentin last month. I will reduce her dose back down to 4 times a day. She does not need a refill on her gabapentin or her voltaren GEL at this time. But she understands that she will reduce her dose from 5 times a day to 4 times a day with a gabapentin.  We'll set patient up for repeat right hip injection next month.

## 2012-02-18 ENCOUNTER — Other Ambulatory Visit: Payer: Self-pay | Admitting: *Deleted

## 2012-02-18 MED ORDER — LISINOPRIL-HYDROCHLOROTHIAZIDE 10-12.5 MG PO TABS: 1.0000 | ORAL_TABLET | Freq: Every day | ORAL | Status: DC

## 2012-02-20 ENCOUNTER — Telehealth: Payer: Self-pay | Admitting: Internal Medicine

## 2012-02-20 NOTE — Telephone Encounter (Signed)
Pt calls at 17: 02 3/17 asking to send prescription for Morphine 20 mg BID. She has called earlier this month( looking at notes seems like =on 3/5) about this same issue and her PCP and Dr. [name] decided not to accept the request before discussing during clinic appt. I told the same to pt that I can't send the prescription and will let clinic and her PCP know about this and give her a call back with close appt or further plan. She has Morphine 40mg  caps and advised her to use those for now.

## 2012-02-21 ENCOUNTER — Other Ambulatory Visit: Payer: Self-pay | Admitting: *Deleted

## 2012-02-21 NOTE — Telephone Encounter (Signed)
Please try to schedule an appt w/ pt's pcp asap, it seems she needs to discuss pain meds and dosages w/ him  Thanks, h.

## 2012-02-21 NOTE — Telephone Encounter (Signed)
Last refill 2/12 Pt is requesting Kadian 20 mg tid instead of 40 mg daily.  The 20 mg tabs work better for her. Please advise

## 2012-02-21 NOTE — Telephone Encounter (Signed)
Hi Gayle-  Is she now requesting TID??  She had initially wanted 20mg , 2 pills, once daily, instead of 40mg  once daily.  We have told her we are not comfortable changing her opiates around by phone and it would be best to come in for appt.  Dr. [name] had commented on prior phone note as well.  It also seems she called the pager (dr. patel) at night, at which time we of course could not change her medicines, so she is really reaching here.  If she is asking for TID this is yet another version of what she wants.  Can you please forward to triage pool?  She can come in for appt sooner if this is a big issue.  It does not have to be with me.  It is dosing management that any provider can do.  Romeo Apple

## 2012-02-22 NOTE — Telephone Encounter (Addendum)
I called pt to inform her and she states she was able to pick up refill on Morphine yesterday.  She will keep scheduled appointment and talk about changes at that time. Pt had 2 Rx written on 2/15 so she picked up the second one.

## 2012-03-07 ENCOUNTER — Encounter: Payer: Self-pay | Admitting: Physical Medicine and Rehabilitation

## 2012-03-14 ENCOUNTER — Ambulatory Visit (INDEPENDENT_AMBULATORY_CARE_PROVIDER_SITE_OTHER): Payer: Medicaid Other | Admitting: Internal Medicine

## 2012-03-14 ENCOUNTER — Encounter: Payer: Self-pay | Admitting: Internal Medicine

## 2012-03-14 VITALS — BP 102/70 | HR 60 | Temp 97.0°F | Ht 71.0 in | Wt 293.7 lb

## 2012-03-14 DIAGNOSIS — G8929 Other chronic pain: Secondary | ICD-10-CM

## 2012-03-14 DIAGNOSIS — I10 Essential (primary) hypertension: Secondary | ICD-10-CM

## 2012-03-14 DIAGNOSIS — E119 Type 2 diabetes mellitus without complications: Secondary | ICD-10-CM

## 2012-03-14 DIAGNOSIS — G894 Chronic pain syndrome: Secondary | ICD-10-CM

## 2012-03-14 LAB — GLUCOSE, CAPILLARY: Glucose-Capillary: 54 mg/dL — ABNORMAL LOW (ref 70–99)

## 2012-03-14 MED ORDER — OXYMORPHONE HCL ER 10 MG PO T12A: 10.0000 mg | EXTENDED_RELEASE_TABLET | Freq: Two times a day (BID) | ORAL | Status: DC

## 2012-03-14 MED ORDER — HYDROCODONE-ACETAMINOPHEN 7.5-750 MG PO TABS: 1.0000 | ORAL_TABLET | Freq: Four times a day (QID) | ORAL | Status: DC | PRN

## 2012-03-14 MED ORDER — HYDROCODONE-ACETAMINOPHEN 7.5-500 MG PO TABS: 1.0000 | ORAL_TABLET | Freq: Four times a day (QID) | ORAL | Status: AC | PRN

## 2012-03-14 MED ORDER — PIROXICAM 10 MG PO CAPS: 10.0000 mg | ORAL_CAPSULE | Freq: Every day | ORAL | Status: DC

## 2012-03-14 NOTE — Assessment & Plan Note (Signed)
BP Readings from Last 3 Encounters:  03/14/12 102/70  02/11/12 127/53  01/18/12 134/104   BP soft today and pt reports orthostatic sx at home.  Will d/c metoprolol, con't other antiHTN meds.

## 2012-03-14 NOTE — Assessment & Plan Note (Addendum)
Between last visit and now, pt has called clinic multiple times for changes in her pain regimen.  Today she says nothing is working and she has pain in the neck, back, hips, feet, and somewhat in her wrists.  She is taking kadian and vicodin as prescribed.  She also takes cymbalta, gabapentin, and trazodone.  Concern that opiate regimen is certainly not sufficiently treating her pain.  After much discussion, initially decided to try oxymorphone plus breakthrough vicodin.  However, after those rx's were written, pt asked to go back to just vicodin.  This had actually been her regimen towards the end of 2012 and seemed to be working for her.  Her behavior is at times concerning for drug seeking, but at other times seems quite reasonable.  I also think her cervical pain, which is her main complaint, may be better treated with NSAIDS.  We arrived at the following plan: - piroxicam 10mg  q12h PRN - vicodin 7.5/500mg  1-2 tabs q6h, #180 with one refill - clearly stated that she should STOP kadian - should get UDS at next visit to see whats in it - con't attending pain clinic (no opiates rx'ed there) - rtc 2 months or sooner if regimen not working well - pain contract addended and put back in drawer

## 2012-03-14 NOTE — Progress Notes (Signed)
Subjective:     Patient ID: [name], female   DOB: February 14, 1961, 51 y.o.   MRN: [id number]  HPI Pt is a 51 yo F with a h/o DMII, HCV, depression, chronic pain syndrome, and HTN who presents for f/u.  Please see problem list for HPI, a/p.  Review of Systems As per problems    Objective:   Physical Exam Gen: NAD MSK: TTP diffusely along C/T/L spine.  No TTP in shoulders.  TTP with ROM of b/l hips.  B/l wrists with good ROM, no pain or neuropathy.    Assessment:         Plan:

## 2012-03-15 ENCOUNTER — Other Ambulatory Visit: Payer: Self-pay | Admitting: Internal Medicine

## 2012-03-15 NOTE — Telephone Encounter (Signed)
Call from [facility] asking if we want pt on both Losartan Potassium 50 mg and Lisinopril/HCTZ 10-12.5 I looked in record and only find lisinopril/hctz.   Pt has been receiving both meds since 11/04/11 During last visit pt's BP was 102/70  I called pt to verify but have not heard back from her. Should I d/c the cozaar?

## 2012-03-15 NOTE — Telephone Encounter (Signed)
I called [facility] and d/c cozaar  also asked if pt was getting olmesartan and they said that was never started yet it remains on her med list. I told pharmacy the  metoprolol was d/c.

## 2012-03-15 NOTE — Telephone Encounter (Signed)
Front desk to schedule appointment for 4 weeks.

## 2012-03-15 NOTE — Telephone Encounter (Signed)
Just got #180 hydrocodone with 1 RF earlier this month.  Please D/C the Cozaar but see if you can get pt in with PCP 4 weeks or so bc metoprolol had been D/C'd 2/2 low BP and sxs. If she D/C's both ARB and BB, BP likely to increase.

## 2012-03-20 ENCOUNTER — Encounter
Payer: Medicaid Other | Attending: Physical Medicine and Rehabilitation | Admitting: Physical Medicine and Rehabilitation

## 2012-03-20 ENCOUNTER — Encounter: Payer: Self-pay | Admitting: Physical Medicine and Rehabilitation

## 2012-03-20 VITALS — BP 141/83 | HR 86 | Resp 18 | Ht 71.0 in | Wt 291.2 lb

## 2012-03-20 DIAGNOSIS — M7061 Trochanteric bursitis, right hip: Secondary | ICD-10-CM

## 2012-03-20 DIAGNOSIS — M542 Cervicalgia: Secondary | ICD-10-CM | POA: Insufficient documentation

## 2012-03-20 DIAGNOSIS — M76899 Other specified enthesopathies of unspecified lower limb, excluding foot: Secondary | ICD-10-CM

## 2012-03-20 NOTE — Patient Instructions (Signed)
Post-Injection Inflammatory Reaction An inflammatory reaction is possible any time a needle is used to give an injection. It is called a post-injection inflammatory reaction because it happens after the needle is put through the skin. A reaction may start minutes after the injection was given, or the reaction may appear several hours after the injection was given. A reaction can last for several hours to several days. CAUSES  An injection reaction can be caused by different things. Possible causes include:  A reaction to the medicine or vaccine that was given.   An infection that occurs if germs get inside the body at the injection site.  SYMPTOMS   Some symptoms may be found only at the injection site (localized reaction). These symptoms may include:   Itching.   Redness.   Warmth.   Swelling.   Tenderness.   Pain.   Some symptoms may show up in other parts of the body (systemic reaction). These symptoms may include:   Fever or chills.   Muscle aches.   Nausea.   Headache.   Dizziness.  DIAGNOSIS  To determine if there is a post-injection inflammatory reaction, your caregiver may:  Do a physical exam.   Draw a circle around any redness near the injection site. This will help to show whether the redness is spreading.  TREATMENT  Treatment will depend on what caused the reaction. Treatment will also vary based on how severe your reaction is. Common treatment methods include:  Putting an ice pack over the injection site.   Taking anti-inflammatory medicine, to reduce swelling and itching.   Taking an antibiotic.   Taking pain medicine.  HOME CARE INSTRUCTIONS   Follow all your caregiver's instructions carefully.   Keep the injection site clean.   You may put ice on the injection site.   Put ice in a plastic bag.   Place a towel between your skin and the bag.   Leave the ice on for 15 to 20 minutes, 3 to 4 times a day.   If the reaction is in a joint, you  might need to rest the joint for a while. Ask your caregiver how active you can be.   Only take over-the-counter or prescription medicines for pain, fever, or discomfort as directed by your caregiver. Do not give aspirin to children.  SEEK MEDICAL CARE IF:   You have any questions about your medicines.   Your pain, redness, warmth, swelling, or itching lasts for several hours.   You have a fever, chills, or muscle aches.  SEEK IMMEDIATE MEDICAL CARE IF:  Your pain, swelling, itching, or redness gets worse.   You have trouble breathing.   Your child has a high-pitched cry or does not stop crying.  Document Released: 08/04/2011 Document Revised: 11/11/2011 Document Reviewed: 08/04/2011 Exit[employer] Patient Information 2012 [employer]. 

## 2012-03-20 NOTE — Progress Notes (Signed)
  Subjective:    Patient ID: [name], female    DOB: [birth year], 51 y.o.   MRN: [id number]  HPI  Right hip pain. Chronic, worse with ambulation. Pacing to walk 10 minutes prior to right hip hurting.  Patient had good relief with injection last fall. Conservative management has not prevented recurrence .  This was injected 11/03/2011. This improved pain approximately 40% but is now recurring.may consider reinjection in the upcoming months.patient would like to be scheduled for reinjection next month She can walk about 10 min before R hip stops her from wallking   Pain Inventory Average Pain 8 Pain Right Now 8 My pain is constant, sharp, stabbing and aching  In the last 24 hours, has pain interfered with the following? General activity 8 Relation with others 8 Enjoyment of life 9 What TIME of day is your pain at its worst? morning daytime and night Sleep (in general) Fair  Pain is worse with: walking, bending and standing Pain improves with: rest, pacing activities and medication Relief from Meds: 6  Mobility use a walker how many minutes can you walk? 5 ability to climb steps?  no do you drive?  yes Do you have any goals in this area?  no  Function disabled: date disabled 2008 I need assistance with the following:  dressing, bathing, meal prep, household duties and shopping  Neuro/Psych depression anxiety  Prior Studies Any changes since last visit?  no  Physicians involved in your care Any changes since last visit?  no      Review of Systems  Constitutional: Positive for diaphoresis.       Night sweats  Musculoskeletal: Positive for back pain.       Neck and shoulder, hip, knees  Psychiatric/Behavioral: Positive for dysphoric mood. The patient is nervous/anxious.   All other systems reviewed and are negative.       Objective:   Physical Exam No new focal deficits are appreciated Tender to palpation over right greater trochanter noted.  Someextension down the iliotibial band as well.  5 over 5 strength in lower extremities. Reflexes unchanged from previous visits.     Assessment & Plan:  Indication: Chronic right hip pain nonresponsive to conservative treatment.  Procedure: Right greater trochanter hip injection.   Right hip trochanteric bursitis. This was injected 11/03/2011. This improved pain approximately 40% but is now recurring.may consider reinjection in the upcoming months.patient would like to be scheduled for reinjection next month She can walk about 10 min before R hip stops her from wallking  After reviewing treatment options, as well as risk and benefits of this procedure a consent was signed. The patient was placed in left lateral decubitus position. Using static and real time images ultrasound of the right greater trochanterwere obtained. Images were stored. The area was marked. Betadine swallows were used to clean the area followed by alcohol prep. 1 cc of Kenalog and 4 cc  of 1% lidocaine was injected into the area without complication.   Discharge instructions were given. Patient was discharged in good condition all questions were answered.

## 2012-03-28 ENCOUNTER — Encounter: Payer: Medicaid Other | Admitting: Internal Medicine

## 2012-04-06 ENCOUNTER — Other Ambulatory Visit: Payer: Self-pay | Admitting: Internal Medicine

## 2012-04-07 ENCOUNTER — Telehealth: Payer: Self-pay | Admitting: *Deleted

## 2012-04-07 NOTE — Telephone Encounter (Signed)
Fax from [facility] - Hydrocodone 7.5-500mg  - Take 1 -2 tabs every 12hrs as needed for pain Qty #60  Thanks

## 2012-04-07 NOTE — Telephone Encounter (Signed)
Denied. Pt was Rx #180 plus one refill on 03/14/2012. Too early to refill.

## 2012-04-10 NOTE — Telephone Encounter (Signed)
[facility] made awared of denial.

## 2012-04-18 ENCOUNTER — Encounter: Payer: Self-pay | Admitting: Internal Medicine

## 2012-04-18 ENCOUNTER — Ambulatory Visit (INDEPENDENT_AMBULATORY_CARE_PROVIDER_SITE_OTHER): Payer: Medicaid Other | Admitting: Internal Medicine

## 2012-04-18 ENCOUNTER — Telehealth: Payer: Self-pay | Admitting: Internal Medicine

## 2012-04-18 VITALS — BP 137/93 | HR 105 | Temp 97.1°F | Wt 294.0 lb

## 2012-04-18 DIAGNOSIS — E119 Type 2 diabetes mellitus without complications: Secondary | ICD-10-CM

## 2012-04-18 DIAGNOSIS — G894 Chronic pain syndrome: Secondary | ICD-10-CM

## 2012-04-18 DIAGNOSIS — F329 Major depressive disorder, single episode, unspecified: Secondary | ICD-10-CM

## 2012-04-18 DIAGNOSIS — I10 Essential (primary) hypertension: Secondary | ICD-10-CM

## 2012-04-18 DIAGNOSIS — Z79899 Other long term (current) drug therapy: Secondary | ICD-10-CM

## 2012-04-18 LAB — GLUCOSE, CAPILLARY: Glucose-Capillary: 113 mg/dL — ABNORMAL HIGH (ref 70–99)

## 2012-04-18 LAB — POCT GLYCOSYLATED HEMOGLOBIN (HGB A1C): Hemoglobin A1C: 6.1

## 2012-04-18 MED ORDER — HYDROCODONE-ACETAMINOPHEN 7.5-500 MG PO TABS: 2.0000 | ORAL_TABLET | Freq: Four times a day (QID) | ORAL | Status: AC | PRN

## 2012-04-18 NOTE — Assessment & Plan Note (Addendum)
Larey Seat last week when getting out of car.  No visible trauma.  Says b/l knees and lower back con't to hurt.  Chronic pain reasonably controlled, though says she is a 9/10.  Recently had R hip injected by pain clinic for trochanteric bursitis.  Gave vicodin 7.5/500 #180, which pt filled in April, then the one refill i gave her was filled in 04/10/12 but only #120 b/c the pharmacy didn't have adequate supplies.  Pt will get remaining rx at this time. - gave refill of vicodin for June/july with "do not fill until 05/12/12" on rx.  Copied and put in pain contract chart - UDS today.  Should be vicodin only - pt asked about patches, but has tried lidocaine patches and voltaren gel unsuccessfully in past.  No rx for fentayl patch at this time.

## 2012-04-18 NOTE — Progress Notes (Signed)
Subjective:     Patient ID: [name], female   DOB: [birth year], 51 y.o.   MRN: [id number]  HPI HPI  Pt is a 51 yo F with a h/o DMII, HCV, depression, chronic pain syndrome, and HTN who presents for f/u.   Please see problem list for HPI, a/p.   Review of Systems deferred    Objective:   Physical Exam Gen: NAD, walking with walker.    Assessment:         Plan:

## 2012-04-18 NOTE — Telephone Encounter (Signed)
Pt called around 6 30 pm, 04/18/12. Got new prescription of Vicodin from Dr. Berlinda Last, which was supposed to be for 180 tabs, but pharmacy won't fill 180- would just give 120. Wanted me to call pharmacy and make her have the extra 60 tabs as she didn't want to drive to [place] from [place] again.  I called [facility] pharmacy and talked with Pharmacist at length. They do not have the  New prescription with 2 tablets per 6 hrs PRN as patient didn't go there, but called in the prescription. Pharmacy needs new script or else can only fill 120 tabs per last refill instruction of 1 tab q6hr.  Called patient back and she said, she will call clinic tomorrow.

## 2012-04-18 NOTE — Assessment & Plan Note (Signed)
BP Readings from Last 3 Encounters:  04/18/12 137/93  03/20/12 141/83  03/14/12 102/70   Just slightly above goal today, now on prinzide only, though pt unsure given multiple prior rx.  Will go home and double check with most recent med list.  Orthostatic sx resolved off beta blocker.  If BP con't to run slightly high, can consider additional agent, though pt already on many meds. - con't to follow

## 2012-04-18 NOTE — Assessment & Plan Note (Signed)
A1c 6.1 again today on metformin 500 bid and glipizide 10mg .  Pt did not bring meter but says she checks CBG roughly daily with a.m. Values around 110s-120s. - con't current meds - bring meter next time - a1c in 3 months - on ACEi - on ASA - BP controlled - lipids controlled

## 2012-04-18 NOTE — Assessment & Plan Note (Signed)
Followed by [facility] in [place], but pt does not remember MD name.  Recently started on wellbutrin, though continues abilify, cymbalta, and trazadone.  Definite concern for polypharmacy, but pt thinks meds are helping her mood. - con't to follow

## 2012-04-19 ENCOUNTER — Telehealth: Payer: Self-pay | Admitting: *Deleted

## 2012-04-19 ENCOUNTER — Encounter: Payer: Self-pay | Admitting: Physical Medicine and Rehabilitation

## 2012-04-19 ENCOUNTER — Encounter
Payer: Medicaid Other | Attending: Physical Medicine and Rehabilitation | Admitting: Physical Medicine and Rehabilitation

## 2012-04-19 VITALS — BP 135/85 | HR 89 | Resp 16 | Ht 71.0 in | Wt 289.0 lb

## 2012-04-19 DIAGNOSIS — M533 Sacrococcygeal disorders, not elsewhere classified: Secondary | ICD-10-CM | POA: Insufficient documentation

## 2012-04-19 DIAGNOSIS — M545 Low back pain, unspecified: Secondary | ICD-10-CM | POA: Insufficient documentation

## 2012-04-19 DIAGNOSIS — M47816 Spondylosis without myelopathy or radiculopathy, lumbar region: Secondary | ICD-10-CM

## 2012-04-19 DIAGNOSIS — M47817 Spondylosis without myelopathy or radiculopathy, lumbosacral region: Secondary | ICD-10-CM | POA: Insufficient documentation

## 2012-04-19 LAB — PRESCRIPTION ABUSE MONITORING 15P, URINE
Barbiturate Screen, Urine: NEGATIVE ng/mL
Benzodiazepine Screen, Urine: NEGATIVE ng/mL
Buprenorphine, Urine: NEGATIVE ng/mL
Carisoprodol, Urine: NEGATIVE ng/mL
Meperidine, Ur: NEGATIVE ng/mL
Methadone Screen, Urine: NEGATIVE ng/mL
Propoxyphene: NEGATIVE ng/mL

## 2012-04-19 NOTE — Progress Notes (Addendum)
Subjective:    Patient ID: Markitta Ausburn, female    DOB: May 12, 1961, 51 y.o.   MRN: [id number]  HPI   The patient is a 51 year old woman who is followed here in our center for pain and rehabilitative medicine for multiple chronic pain problems. These included neck pain, bilateral foot burning and tingling, knee pain, low back pain, and hand pain as well his right hip pain.  she reports no new medical problems since he was last seen. She continues to use home health assistance for some activities of daily living.   Her chief complaint today is right low back pain which is getting in the way of her activities of daily living. She's been using a walker. Pain is described as a deep ache localized to the low right low back. She reports a constant pain and discomfort.  No history of new weakness numbness or tingling  No history of  Fever/ chills    PAST MEDICAL HISTORY: Positive for:  1. History of depression.  2. Diabetes mellitus type 2 x7 years.  3. Hypertension.  4. Obesity.  5. Tobacco use.  6. History of cocaine use and alcohol, sober since 2001. History of substance abuse. Documented in the chart on July 14, 2011.      Pain Inventory Average Pain 8 Pain Right Now 8 My pain is sharp, stabbing and aching  In the last 24 hours, has pain interfered with the following? General activity 8 Relation with others 9 Enjoyment of life 9 What TIME of day is your pain at its worst? daytime,evening and night Sleep (in general) Fair  Pain is worse with: walking, bending, standing and some activites Pain improves with: rest and medication Relief from Meds: 4  Mobility walk with assistance use a walker ability to climb steps?  no do you drive?  yes transfers alone Do you have any goals in this area?  no  Function not employed: date last employed 2008 I need assistance with the following:  dressing, bathing, meal prep, household duties and  shopping  Neuro/Psych weakness trouble walking depression anxiety  Prior Studies Any changes since last visit?  no  Physicians involved in your care Any changes since last visit?  no       Review of Systems  Constitutional: Positive for diaphoresis.  HENT: Negative.   Eyes: Negative.   Respiratory: Positive for cough.   Cardiovascular: Negative.   Gastrointestinal: Negative.   Genitourinary: Negative.   Musculoskeletal: Negative.   Skin: Negative.   Neurological: Negative.   Hematological: Negative.   Psychiatric/Behavioral: Negative.        Objective:   Physical Exam  Physical Exam  I the patient is a moderately obese woman who does not appear in any distress he is oriented x3 her speech is clear her affect is bright she's alert cooperative and pleasant she follows commands without difficulty and answers the questions appropriately  Her cranial nerves coordination are intact  Her reflexes are 2+ in the upper extremities 2+ in the lower extremities and symmetric no abnormal tone clonus or tremors are noted  Motor strength is good in both upper and lower extremity is about obvious focal deficit except weakness right gastroc for 4+ over 5.   She reports decreased sensation in hands and feet.  She transitions from sitting to standing slowly she uses a walker for ambulation  She has decreased range of motion in cervical spine in all planes. Shoulder range of motion is significantly diminished especially with  an abduction and internal rotation.  She has crepitus with flexion extension at both knees and no obvious effusion is appreciated. Joint line tenderness especially medial is noted  No tenderness over trochanter.   Hip range of motion is fairly well preserves without groin or posterior hip pain.Hip abductor's on the right slightly weaker than left hip abductor's . 4+/ 5 versus 5 over 5.          Assessment & Plan:  1. Right low back pain  may be SI related vs  facet Pin over SI joint but also pain with extension. Her right low back pain is her biggest problem for her right now and limits her function. We discussed various treatment options with her she would like to pursue sacroiliac joint injection. If this we are also consideringdoes not help I would consider facet medial branch blocks. Will also try a TENS unit for chronic low back pain   2 Neck pain with radiation to the shoulders: recent cervical MRI is reviewed in electronic medical record. Multilevel cervical spondylosis is appreciated.     3. Right hip trochanteric bursitis. This was injected 11/03/2011. This improved pain approximately 40% but is now recurring.may consider reinjection in the upcoming months.patient would like to be scheduled for reinjection next month.    4. Chronic burning foot pain x6-7 years consistent with diabetic peripheral neuropathy.     5. Bilateral knee osteoarthritis.     6. Bilateral hand achiness. She does have a history of left carpal tunnel surgery     Plan:   Continue voltaren gel to use as directed.    She is nonnarcotic management as she is comfortable with the treatment plan at this time.   opioid risk tool greater than 8.

## 2012-04-19 NOTE — Telephone Encounter (Signed)
Pt stopped by [facility] and needs extra Rx Vicdin 7.5/750mg  - pharmacy only gave her # 120 due to directions - Rx was written # 180.. Talked with Dr Rogelia Boga about med - pt finally decided to pay out of pocket 04/26/12 $37.99 for remaining 60 tablet. Then pt can fill new Rx 05/12/12 with new directions. Talked with Leonie Douglas at [facility]. Blayze Haen RN 04/19/12 11:40AM

## 2012-04-19 NOTE — Patient Instructions (Addendum)
We are setting you up for a sacral iliac joint injection on the right. Are being sent you to physical therapy for a TENS unit

## 2012-04-20 LAB — OPIATES/OPIOIDS (LC/MS-MS)
Codeine Urine: NEGATIVE NG/ML
Heroin (6-AM), UR: NEGATIVE NG/ML
Hydromorphone: NEGATIVE NG/ML
Morphine Urine: NEGATIVE NG/ML
Oxycodone, ur: NEGATIVE NG/ML

## 2012-04-26 ENCOUNTER — Ambulatory Visit: Payer: Medicaid Other | Attending: Physical Medicine and Rehabilitation

## 2012-05-04 ENCOUNTER — Ambulatory Visit: Payer: Medicaid Other

## 2012-05-05 DIAGNOSIS — M545 Low back pain, unspecified: Secondary | ICD-10-CM | POA: Insufficient documentation

## 2012-05-05 DIAGNOSIS — M25559 Pain in unspecified hip: Secondary | ICD-10-CM | POA: Insufficient documentation

## 2012-05-05 DIAGNOSIS — IMO0001 Reserved for inherently not codable concepts without codable children: Secondary | ICD-10-CM | POA: Insufficient documentation

## 2012-05-08 ENCOUNTER — Telehealth: Payer: Self-pay | Admitting: *Deleted

## 2012-05-08 ENCOUNTER — Other Ambulatory Visit: Payer: Self-pay | Admitting: Internal Medicine

## 2012-05-08 DIAGNOSIS — E119 Type 2 diabetes mellitus without complications: Secondary | ICD-10-CM

## 2012-05-08 MED ORDER — METFORMIN HCL 500 MG PO TABS: 500.0000 mg | ORAL_TABLET | Freq: Two times a day (BID) | ORAL | Status: DC

## 2012-05-08 NOTE — Telephone Encounter (Signed)
Fax from [facility].  Pt states she is taking Metformin1000mg  1/2 tab  BID d/t to hypoglycemia. According to office note 5/14, pt is on 500mg . If this is correct, the pharmacy needs a new rx . Current medlist has pt on Metfromin 1000mg  1 tab BID.   Thanks.

## 2012-05-10 ENCOUNTER — Ambulatory Visit: Payer: Medicaid Other | Attending: Physical Medicine and Rehabilitation

## 2012-05-10 DIAGNOSIS — IMO0001 Reserved for inherently not codable concepts without codable children: Secondary | ICD-10-CM | POA: Insufficient documentation

## 2012-05-10 DIAGNOSIS — M545 Low back pain, unspecified: Secondary | ICD-10-CM | POA: Insufficient documentation

## 2012-05-10 DIAGNOSIS — M25559 Pain in unspecified hip: Secondary | ICD-10-CM | POA: Insufficient documentation

## 2012-05-12 ENCOUNTER — Other Ambulatory Visit: Payer: Self-pay | Admitting: Internal Medicine

## 2012-05-15 ENCOUNTER — Ambulatory Visit (INDEPENDENT_AMBULATORY_CARE_PROVIDER_SITE_OTHER): Payer: Medicaid Other | Admitting: Internal Medicine

## 2012-05-15 ENCOUNTER — Encounter: Payer: Self-pay | Admitting: Internal Medicine

## 2012-05-15 ENCOUNTER — Other Ambulatory Visit: Payer: Self-pay | Admitting: *Deleted

## 2012-05-15 ENCOUNTER — Other Ambulatory Visit: Payer: Self-pay | Admitting: Internal Medicine

## 2012-05-15 VITALS — BP 132/98 | HR 81 | Temp 97.8°F | Ht 71.0 in | Wt 297.5 lb

## 2012-05-15 DIAGNOSIS — E119 Type 2 diabetes mellitus without complications: Secondary | ICD-10-CM

## 2012-05-15 DIAGNOSIS — G894 Chronic pain syndrome: Secondary | ICD-10-CM

## 2012-05-15 DIAGNOSIS — F329 Major depressive disorder, single episode, unspecified: Secondary | ICD-10-CM

## 2012-05-15 DIAGNOSIS — E1149 Type 2 diabetes mellitus with other diabetic neurological complication: Secondary | ICD-10-CM

## 2012-05-15 DIAGNOSIS — E1142 Type 2 diabetes mellitus with diabetic polyneuropathy: Secondary | ICD-10-CM

## 2012-05-15 MED ORDER — OMEPRAZOLE 20 MG PO CPDR: 20.0000 mg | DELAYED_RELEASE_CAPSULE | Freq: Every day | ORAL | Status: DC

## 2012-05-15 NOTE — Assessment & Plan Note (Signed)
Patient brought in her meter today with no new values. Does not complain of any lows. Had self titrated her metformin because she thought it was causing hypoglycemia, we discussed that glipizide would be the medicine because hyperglycemia. She's not due for an A1c today. - Continue metformin 500 twice a day - Continue glipizide 10 mg daily - Continue ACE inhibitor - Continue aspirin - A1c in August - Lipids controlled - BP controlled - Had eye exam last year

## 2012-05-15 NOTE — Progress Notes (Signed)
Subjective:     Patient ID: [name], female   DOB: [birth year], 51 y.o.   MRN: [id number]  HPI Patient is a 51 year old woman with history of diabetes, HCT, depression, chronic pain syndrome, and hypertension who presents for routine followup.  Patient has recently been seen in the chronic pain clinic, and is scheduled to get an injection next week. She is also working on getting a TENS unit for her lower back pain. Please see their note from 04/19/12 for more details. Patient is still taking Vicodin with moderate pain relief, she would like to continue.  Patient reports good compliance with her metformin and glipizide. She does not report any symptomatic lows. She does complain of some peripheral neuropathy in her feet, especially at night, despite gabapentin therapy.  Review of Systems Denies chest pain, palpitations, shortness of breath    Objective:   Physical Exam Gen: NAD, cooperative CV: RRR Chest: symmetric expansion, no respiratory distress Neuro: ambulating w/o difficulty, communicating appropriately    Assessment:         Plan:

## 2012-05-15 NOTE — Assessment & Plan Note (Signed)
Patient does report some worsening neuropathy, especially at night. She is currently on gabapentin 300 3 times a day. She has tried increased doses of gabapentin before, but says it caused her some nausea. - We agreed to try increasing her evening dose of gabapentin to 600 mg, in keeping the others at 300

## 2012-05-15 NOTE — Assessment & Plan Note (Signed)
Follow[facility] by [facility]. Says she has followup scheduled. Is currently on Abilify, Cymbalta, and trazodone. Patient reports a stable mood, denies SI - Continue to follow

## 2012-05-15 NOTE — Assessment & Plan Note (Addendum)
Continues to be an ongoing issue for this patient. Was recently seen in pain clinic. Based on the note from pain clinic, it looks like it may be pursuing another injection as well as a TENS unit. We do continue to prescribe narcotics to this patient, which she says are helpful. There has been some question of her last UDS, that did not show hydromorphone. I have left multiple messages with the UDS lab technician for help clarifying this. I will continue to look into it for UDS results are appropriate or not. For now, we will continue with Vicodin. She does have a refill for July already. She will need a new prescription starting on August 7. She will continue to follow with pain clinic. - Should have Vicodin through August 7 - Follow with pain clinic - Need to followup UDS results (have left message--today and in past--with Gwynneth Macleod at [phone])  **Addendum** UDS results are neg for hydromorphone (an active metabolite of hydrocodone).  This is very suspicious for pill dipping, and pt has h/o suspicious UDS last year.  At next visit, need to ask pt if she has been taking vicodin, how often, has she run out, all without raising suspicious.  Then get UDS, and we will see if results are consistent.  We will schedule her for the end of July (since she should have vicodin through that date) with her new PCP to have this visit. - GET UDS NEXT VISIT

## 2012-05-16 NOTE — Progress Notes (Signed)
Rx called into pharmacy. Jenan Ellegood RN 05/16/12 4:15PM 

## 2012-05-18 ENCOUNTER — Encounter: Payer: Self-pay | Admitting: Physical Medicine & Rehabilitation

## 2012-05-18 ENCOUNTER — Encounter: Payer: Medicaid Other | Attending: Physical Medicine and Rehabilitation

## 2012-05-18 ENCOUNTER — Ambulatory Visit (HOSPITAL_BASED_OUTPATIENT_CLINIC_OR_DEPARTMENT_OTHER): Payer: Medicaid Other | Admitting: Physical Medicine & Rehabilitation

## 2012-05-18 VITALS — BP 151/77 | HR 90 | Resp 16 | Ht 71.0 in | Wt 291.0 lb

## 2012-05-18 DIAGNOSIS — M533 Sacrococcygeal disorders, not elsewhere classified: Secondary | ICD-10-CM

## 2012-05-18 DIAGNOSIS — M542 Cervicalgia: Secondary | ICD-10-CM | POA: Insufficient documentation

## 2012-05-18 NOTE — Progress Notes (Signed)
Right sacroiliac injection under fluoroscopic guidance  Indication: Right Low back and buttocks pain not relieved by medication management and other conservative care.  Informed consent was obtained after describing risks and benefits of the procedure with the patient, this includes bleeding, bruising, infection, paralysis and medication side effects. The patient wishes to proceed and has given written consent. The patient was placed in a prone position. The lumbar and sacral area was marked and prepped with Betadine. A 25-gauge 1-1/2 inch needle was inserted into the skin and subcutaneous tissue and 1 mL of 1% lidocaine was injected. Then a 25-gauge 3.5 inch spinal needle was inserted under fluoroscopic guidance into the Right sacroiliac joint. AP and lateral images were utilized. Omnipaque 180x0.5 mL under live fluoroscopy demonstrated no intravascular uptake. Then a solution containing one ML of 40 mg per mL depomedrol and 2 ML of 1% lidocaine MPF was injected x1.5 mL. Patient tolerated the procedure well. Post procedure instructions were given. Please see post procedure form.

## 2012-05-18 NOTE — Patient Instructions (Signed)
Please monitor your pain level. We are looking for a reduction of pain the right side of the low back area. Please report how much pain relief he gets to Dr. Pamelia Hoit next visit. We may need to repeat this injection in the future or try a different area

## 2012-05-18 NOTE — Progress Notes (Signed)
  PROCEDURE RECORD [facility]   Name: Katoria Yetman DOB:[birth year] MRN: [id number]  Date:05/18/2012  Physician: Claudette Laws, MD    Nurse/CMA: Redgie Grayer  Allergies: No Known Allergies  Consent Signed: yes  Is patient diabetic? yes  CBG today? Didn't check this morning  Pregnant: no LMP: Patient's last menstrual period was 12/06/1976. (age 51-55)  Anticoagulants: no Anti-inflammatory: no Antibiotics: no  Procedure: Sacroiliac Injection  Position: Prone Start Time: 10:03am  End Time: 10:05am  Fluoro Time: 9 seconds  RN/CMA Hampton Abbot    Time 9:16am 10:07am    BP 151/77 149/99    Pulse 90 92    Respirations 16 16    O2 Sat 98% 96%    S/S 6 6    Pain Level 9/10 9/10     D/C home with Geraline (sister), patient A & O X 3, D/C instructions reviewed, and sits independently.

## 2012-06-06 ENCOUNTER — Other Ambulatory Visit: Payer: Self-pay | Admitting: Internal Medicine

## 2012-06-06 NOTE — Telephone Encounter (Signed)
Denied. This is a topical antibiotic and is meant to only be used a week or two at most.

## 2012-06-07 ENCOUNTER — Other Ambulatory Visit: Payer: Self-pay | Admitting: *Deleted

## 2012-06-07 DIAGNOSIS — E119 Type 2 diabetes mellitus without complications: Secondary | ICD-10-CM

## 2012-06-07 MED ORDER — OMEPRAZOLE 20 MG PO CPDR: 20.0000 mg | DELAYED_RELEASE_CAPSULE | Freq: Every day | ORAL | Status: DC

## 2012-06-07 NOTE — Telephone Encounter (Signed)
Per Dr Danice Goltz last note, she has enough hydrocodone until Aug 7th. I am having trouble figuring this out in EPIC. Doesn't use local pharmacy. If I am wrong about lasting until Aug, pls let me know.

## 2012-06-12 ENCOUNTER — Other Ambulatory Visit: Payer: Self-pay | Admitting: Internal Medicine

## 2012-06-12 NOTE — Telephone Encounter (Signed)
This was already refilled on 7/02 by Dr. Aundria Rud.

## 2012-06-13 NOTE — Telephone Encounter (Signed)
Ran [place] database and called pharmacy. [facility]. Needs reminded that she can only use one pharmacy.    4/10 #120 (has 60 remaining that she can pick up) 5/7 #120 5/22 #60 6/7 #180 6/27 #180   She got the 6/27 early so isn't due for refill until Aug 6th, which is exactly what Dr Berlinda Last wrote in his note. She has had red flags - significant in past - so Myriam Jacobson has arranged for appt - we must get UDS at that time.

## 2012-06-13 NOTE — Telephone Encounter (Signed)
Please schedule pt with her new pcp the last week of July close to the first of that week per dr Danice Goltz last visit note, please tell pt that it is to refill her pain meds and check her comfort level. Thank you

## 2012-06-16 ENCOUNTER — Encounter: Payer: Self-pay | Admitting: Physical Medicine and Rehabilitation

## 2012-06-16 ENCOUNTER — Encounter: Payer: Medicaid Other | Attending: Physical Medicine & Rehabilitation | Admitting: Physical Medicine and Rehabilitation

## 2012-06-16 VITALS — BP 123/80 | HR 105 | Resp 16 | Ht 71.0 in | Wt 292.6 lb

## 2012-06-16 DIAGNOSIS — M76899 Other specified enthesopathies of unspecified lower limb, excluding foot: Secondary | ICD-10-CM | POA: Insufficient documentation

## 2012-06-16 DIAGNOSIS — G8929 Other chronic pain: Secondary | ICD-10-CM | POA: Insufficient documentation

## 2012-06-16 DIAGNOSIS — M79609 Pain in unspecified limb: Secondary | ICD-10-CM | POA: Insufficient documentation

## 2012-06-16 DIAGNOSIS — M25519 Pain in unspecified shoulder: Secondary | ICD-10-CM | POA: Insufficient documentation

## 2012-06-16 DIAGNOSIS — M25559 Pain in unspecified hip: Secondary | ICD-10-CM | POA: Insufficient documentation

## 2012-06-16 DIAGNOSIS — M25569 Pain in unspecified knee: Secondary | ICD-10-CM | POA: Insufficient documentation

## 2012-06-16 DIAGNOSIS — M1710 Unilateral primary osteoarthritis, unspecified knee: Secondary | ICD-10-CM | POA: Insufficient documentation

## 2012-06-16 DIAGNOSIS — M545 Low back pain, unspecified: Secondary | ICD-10-CM

## 2012-06-16 DIAGNOSIS — R209 Unspecified disturbances of skin sensation: Secondary | ICD-10-CM | POA: Insufficient documentation

## 2012-06-16 DIAGNOSIS — M542 Cervicalgia: Secondary | ICD-10-CM | POA: Insufficient documentation

## 2012-06-16 MED ORDER — DICLOFENAC EPOLAMINE 1.3 % TD PTCH: 1.0000 | MEDICATED_PATCH | Freq: Two times a day (BID) | TRANSDERMAL | Status: DC

## 2012-06-16 NOTE — Progress Notes (Signed)
Subjective:    Patient ID: [name], female    DOB: 06/13/1961, 51 y.o.   MRN: [id number]  HPI  The patient complains about chronic neck pain, bilateral foot burning and tingling, knee pain, low back pain, and hand pain as well his right hip pain. Her problem has been stable.  She reports no new medical problems since he was last seen. She continues to use home health assistance for some activities of daily living.   Pain Inventory Average Pain 7 Pain Right Now 7 My pain is sharp, stabbing and aching  In the last 24 hours, has pain interfered with the following? General activity 9 Relation with others 9 Enjoyment of life 9 What TIME of day is your pain at its worst? daytime and night Sleep (in general) Poor  Pain is worse with: walking, bending and standing Pain improves with: rest Relief from Meds: 8  Mobility walk without assistance use a walker ability to climb steps?  no do you drive?  yes  Function disabled: date disabled  I need assistance with the following:  dressing, meal prep, household duties and shopping  Neuro/Psych depression anxiety  Prior Studies Any changes since last visit?  no  Physicians involved in your care Any changes since last visit?  no   Family History  Problem Relation Age of Onset  . Uterine cancer Mother   . Schizophrenia Son   . Diabetes    . Arthritis    . Hypertension     History   Social History  . Marital Status: Single    Spouse Name: N/A    Number of Children: N/A  . Years of Education: N/A   Occupational History  . CNA     worked for 15 years   Social History Main Topics  . Smoking status: Current Everyday Smoker -- 0.8 packs/day for 30 years    Types: Cigarettes  . Smokeless tobacco: Never Used  . Alcohol Use: No  . Drug Use: No  . Sexually Active: None   Other Topics Concern  . None   Social History Narrative   Financial assistance approved for 100% discount at [facility] and has [facility] cardDeborah  [facility]  September 29, 2010 2:27 PM   Past Surgical History  Procedure Date  . Abdominal hysterectomy     at age 64, unknown reasons  . Carpal tunnel release jan 2013    left side   Past Medical History  Diagnosis Date  . Depression     secondary to loss of her son at age 75  . DMII (diabetes mellitus, type 2)   . Hypertension   . Low back pain   . Hep C w/o coma, chronic   . Insomnia   . Substance abuse     sober since 2001  . Chronic neck pain   . Tobacco abuse   . Hepatitis C   . Insomnia   . 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 Obesity   . Arthritis   . Lumbago   . Enthesopathy of hip region   . Primary localized osteoarthrosis, lower leg   . Chondromalacia of patella   . Cervicalgia   . Pain in joint, upper arm   . Disturbance of skin sensation   . Chronic pain syndrome   . Diabetes mellitus    BP 123/80  Pulse 105  Resp 16  Ht 5\' 11"  (1.803 m)  Wt 292 lb 9.6 oz (132.722 kg)  BMI 40.81 kg/m2  SpO2 95%  LMP 12/06/1976    Review of Systems  Constitutional: Positive for diaphoresis.  Respiratory: Positive for cough.   Musculoskeletal: Positive for back pain.  Psychiatric/Behavioral: Positive for dysphoric mood. The patient is nervous/anxious.   All other systems reviewed and are negative.       Objective:   Physical Exam  Constitutional: She is oriented to person, place, and time. She appears well-developed and well-nourished.  HENT:  Head: Normocephalic.  Neck: Neck supple.  Musculoskeletal: She exhibits tenderness.  Neurological: She is alert and oriented to person, place, and time.  Skin: Skin is warm and dry.  Psychiatric: She has a normal mood and affect.   Symmetric normal motor tone is noted throughout. Normal muscle bulk. Muscle testing reveals 5/5 muscle strength of the upper extremity, and 5/5 of the lower extremity. Full range of motion in upper and lower extremities, except shoulders bilateral, abduction 45 , flexion 45 in both  shoulder. ROM of spine is  restricted. Fine motor movements are normal in both hands.  DTR in the upper and lower extremity are present and symmetric 2+. No clonus is noted.  Patient arises from chair without difficulty. Narrow based gait with normal arm swing bilateral , able to walk on heels and toes . Tandem walk is stable. No pronator drift. Rhomberg negati        Assessment & Plan:  1. Right low back pain 2 Neck pain with radiation to the shoulders: recent cervical MRI is reviewed in electronic medical record. Multilevel cervical spondylosis is appreciated.  3. Right hip trochanteric bursitis. This was injected 11/03/2011. This improved pain approximately 40% but is now recurring.may consider reinjection in the upcoming months.patient would like to be scheduled for reinjection next month.  4. Chronic burning foot pain x6-7 years consistent with diabetic peripheral neuropathy.  5. Bilateral knee osteoarthritis.  6. Bilateral hand achiness. She does have a history of left carpal tunnel surgery  Plan:  Prescribed Flector patches, for LBP and posterior hip pain. Advised patient to do a foot massage with a ball or in ice bottle. Advised patient to do some painting exercises for her shoulders to improve range of motion . Will fax order for electrodes and batteries for tens unit to: [facility] [address]  She is nonnarcotic management as she is comfortable with the treatment plan at this time.  opioid risk tool greater than 8. Followup in one month

## 2012-06-16 NOTE — Patient Instructions (Addendum)
Continue with walking as much as tolerated.Try to massage your feet, with a ball or icebottle.

## 2012-06-27 ENCOUNTER — Telehealth: Payer: Self-pay | Admitting: *Deleted

## 2012-06-27 ENCOUNTER — Encounter: Payer: Self-pay | Admitting: Internal Medicine

## 2012-06-27 ENCOUNTER — Ambulatory Visit (INDEPENDENT_AMBULATORY_CARE_PROVIDER_SITE_OTHER): Payer: Medicaid Other | Admitting: Internal Medicine

## 2012-06-27 VITALS — BP 124/88 | HR 99 | Temp 97.9°F | Wt 296.8 lb

## 2012-06-27 DIAGNOSIS — R05 Cough: Secondary | ICD-10-CM

## 2012-06-27 DIAGNOSIS — M545 Low back pain, unspecified: Secondary | ICD-10-CM

## 2012-06-27 DIAGNOSIS — F329 Major depressive disorder, single episode, unspecified: Secondary | ICD-10-CM

## 2012-06-27 DIAGNOSIS — Z720 Tobacco use: Secondary | ICD-10-CM | POA: Insufficient documentation

## 2012-06-27 DIAGNOSIS — E669 Obesity, unspecified: Secondary | ICD-10-CM | POA: Insufficient documentation

## 2012-06-27 DIAGNOSIS — M722 Plantar fascial fibromatosis: Secondary | ICD-10-CM

## 2012-06-27 DIAGNOSIS — M76899 Other specified enthesopathies of unspecified lower limb, excluding foot: Secondary | ICD-10-CM

## 2012-06-27 DIAGNOSIS — R059 Cough, unspecified: Secondary | ICD-10-CM | POA: Insufficient documentation

## 2012-06-27 DIAGNOSIS — G894 Chronic pain syndrome: Secondary | ICD-10-CM

## 2012-06-27 DIAGNOSIS — M7061 Trochanteric bursitis, right hip: Secondary | ICD-10-CM

## 2012-06-27 DIAGNOSIS — B1920 Unspecified viral hepatitis C without hepatic coma: Secondary | ICD-10-CM

## 2012-06-27 DIAGNOSIS — I10 Essential (primary) hypertension: Secondary | ICD-10-CM

## 2012-06-27 DIAGNOSIS — M5090 Cervical disc disorder, unspecified, unspecified cervical region: Secondary | ICD-10-CM

## 2012-06-27 DIAGNOSIS — M533 Sacrococcygeal disorders, not elsewhere classified: Secondary | ICD-10-CM

## 2012-06-27 DIAGNOSIS — F17200 Nicotine dependence, unspecified, uncomplicated: Secondary | ICD-10-CM

## 2012-06-27 DIAGNOSIS — E1149 Type 2 diabetes mellitus with other diabetic neurological complication: Secondary | ICD-10-CM

## 2012-06-27 DIAGNOSIS — G4700 Insomnia, unspecified: Secondary | ICD-10-CM

## 2012-06-27 DIAGNOSIS — E119 Type 2 diabetes mellitus without complications: Secondary | ICD-10-CM

## 2012-06-27 DIAGNOSIS — B1710 Acute hepatitis C without hepatic coma: Secondary | ICD-10-CM

## 2012-06-27 MED ORDER — HYDROCODONE-ACETAMINOPHEN 7.5-500 MG PO TABS: 1.0000 | ORAL_TABLET | Freq: Four times a day (QID) | ORAL | Status: AC | PRN

## 2012-06-27 MED ORDER — HYDROCODONE-ACETAMINOPHEN 5-500 MG PO TABS: 1.0000 | ORAL_TABLET | Freq: Four times a day (QID) | ORAL | Status: DC | PRN

## 2012-06-27 MED ORDER — LOSARTAN POTASSIUM-HCTZ 50-12.5 MG PO TABS: 1.0000 | ORAL_TABLET | Freq: Every day | ORAL | Status: DC

## 2012-06-27 NOTE — Assessment & Plan Note (Signed)
Regularly seen in by PM&R in pain clinic.  She just received a SI joint injection and is on NON-NARCOTIC pain management with clinic.  I spoke to them today, and they said its likely some flags were raised in the past that leads them to not prescribe her any narcotics.   Ms. Fishel was receiving Vicodin from our clinic, last prescription seemed to be called in May.  However, her last UDS did not show hydromorphone and results were verified by her PCP.  This raises suspicion for probably misuses of medicine and I was asked by her previous PCP to get another UDS this visit which we did.  Results pending.  I then called her pharmacy to verify refills and her medications.  She picked up 180 tablets of Vicodin on 05/12/12 which is a 22 day supply followed by another one on 6/27.  Additionally, on 7/12 she filled an old prescription from April 2012 of 60 tablets (15 day supply) and claims to stil have some pills left over.  She did want Vicodin refills this visit.  I explained to her that this would be difficult considering she goes to pain clinic and she should be evaluated for all pain and management there.  Dr. William Hamburger went to talk Ms. Soza with me and explained that upon review of her MRI's they did not seem to warrant the Vicodin prescription due to mild evidence.  As such, we would start tapering the Vicodin, from 8 tablets a day to 4 tablets a day and only give this last refill to last a month.  After this month, she will need to follow up exclusively with pain clinic for pain medication as they are better suited to handle her pain and overall management.  She was counseled on the effects of this type of medication, tendency to get addicted, and how we could not justify a standing prescription for it due to our limitations and that she needs proper attention and care from specialists.  She was clearly upset and even started crying, but appeared to understand our position and necessary next steps.  She is  welcome to continue to follow up in clinic for all her other general medical problems.

## 2012-06-27 NOTE — Assessment & Plan Note (Signed)
Presenting with 3 month history of chronic cough with white sputum.  Might be secondary to Ace inhibitor.  Changes to Hyzaar today 50/12.5 and will follow up on next visit to see if cough improved.

## 2012-06-27 NOTE — Assessment & Plan Note (Signed)
Please see chronic pain syndrome plan  To be followed in pain clinic

## 2012-06-27 NOTE — Telephone Encounter (Signed)
Dr Virgina Organ called our office inquiring about the narcotic status as far as our prescribing for [name].  I informed her that we were not prescribing narcotics, only non- narcotic management, therefore, she is not under contract to only get pain medications through our clinic physicians.

## 2012-06-27 NOTE — Assessment & Plan Note (Signed)
On Trazadone  Seen by [facility]

## 2012-06-27 NOTE — Assessment & Plan Note (Signed)
Please see chronic pain syndrome plan  To be followed in pain clinic 

## 2012-06-27 NOTE — Assessment & Plan Note (Signed)
A1c 6.1, recheck next month  Morning glucose as per her was 161, finger stick in clinic 97 today  Currently on Glipizide and metformin and claims to be compliant.

## 2012-06-27 NOTE — Assessment & Plan Note (Signed)
Diagnosed 8 years ago as per patient  Not on any current treatment  Not followed up regularly

## 2012-06-27 NOTE — Assessment & Plan Note (Signed)
Smoking for 35+ years, approximately 15 cigs/day.  Stopped for ten years 2 years ago but restarted.  No plans on quitting at this time.  Cessation advised.

## 2012-06-27 NOTE — Progress Notes (Signed)
Subjective:   Patient ID: [name] female   DOB: 1961-09-27 51 y.o.   MRN: [id number]  HPI: Ms.[name] is a 51 y.o. African American female with extensive past medical history presenting to clinic today for 3 month hx of cough with white sputum and medication refill for Vicodin.  [name] claims she has had this intermittent cough for 3 months, not relieved with any medication or cough syrup, and denies any recent cold or illness.  She denies any associated shortness of breath, congestion, or chest pain.  She does report 1 episode of vomiting 4 days ago because she coughed so much and has occasional nausea.  [name] is also here for a refill of Vicodin.  It appears on her last visit to our clinic, her UDS did not show presence of hydromorphone, as such suspicion was raised of possible misuse of medication.  As such, Dr. Berlinda Last asked for a repeat UDS to be done this visit and explained that he had called a refill for her on last visit that should last her until August 7th.  At that time, we could re-evaluate based on UDS results and [name] presentation. Upon calling pain clinic, I found out that they do not and will not prescribe her any narcotics due to certain suspicion flags raised on their end.  They will continue to treat her on a non-narcotic regimen.  I also called her pharmacy and verified medication and found that she last refilled Vicodin n 6/7, 6/27, and an old prescription on 7/12.  She claims she still currently has some but that she needs more and is only on this one medication that helps with her pain.  Past Medical History  Diagnosis Date  . Depression     secondary to loss of her son at age 57  . DMII (diabetes mellitus, type 2)   . Hypertension   . Low back pain   . Hep C w/o coma, chronic   . Insomnia   . Substance abuse     sober since 2001  . Chronic neck pain   . Tobacco abuse   . Hepatitis C   . Insomnia   . 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 Obesity   . Arthritis   . Lumbago   . Enthesopathy of hip region   . Primary localized osteoarthrosis, lower leg   . Chondromalacia of patella   . Cervicalgia   . Pain in joint, upper arm   . Disturbance of skin sensation   . Chronic pain syndrome   . Diabetes mellitus    Current Outpatient Prescriptions  Medication Sig Dispense Refill  . ACCU-CHEK FASTCLIX LANCETS MISC 1 each by Does not apply route daily. Check blood sugar once daily. 250.00  102 each  4  . ARIPiprazole (ABILIFY) 5 MG tablet Take 1 tablet (5 mg total) by mouth daily.  30 tablet  6  . Blood Glucose Monitoring Suppl (ACCU-CHEK NANO SMARTVIEW) W/DEVICE KIT 1 each by Does not apply route 1 day or 1 dose.  1 kit  0  . buPROPion (WELLBUTRIN) 75 MG tablet Take 75 mg by mouth 2 (two) times daily.      . Calcium Carbonate (CALCARB 600) 1500 MG TABS Take 1 tablet (1,500 mg total) by mouth 2 (two) times daily.  60 tablet  0  . cholecalciferol (VITAMIN D-400) 400 UNITS TABS Take 1 tablet (400 Units total) by mouth daily.  30 each  11  . CYMBALTA  60 MG capsule TAKE 2 CAPSULES BY MOUTH EVERY DAY  60 capsule  4  . diclofenac (FLECTOR) 1.3 % PTCH Place 1 patch onto the skin 2 (two) times daily.  30 patch  2  . gabapentin (NEURONTIN) 300 MG capsule TAKE 1 CAPSULE BY MOUTH THREE TIMES A DAY  90 capsule  5  . glipiZIDE (GLUCOTROL XL) 10 MG 24 hr tablet Take 1 tablet (10 mg total) by mouth daily.  30 tablet  5  . glucose blood (ACCU-CHEK SMARTVIEW) test strip Check blood sugar once daily. 250.00  50 each  12  . HYDROcodone-acetaminophen (LORTAB 7.5) 7.5-500 MG per tablet Take 1 tablet by mouth every 6 (six) hours as needed for pain.  120 tablet  0  . losartan-hydrochlorothiazide (HYZAAR) 50-12.5 MG per tablet Take 1 tablet by mouth daily.  30 tablet  11  . metFORMIN (GLUCOPHAGE) 500 MG tablet Take 1 tablet (500 mg total) by mouth 2 (two) times daily with a meal.  60 tablet  6  . mupirocin ointment (BACTROBAN) 2 %  Apply topically 3 (three) times daily.  22 g  5  . omeprazole (PRILOSEC) 20 MG capsule Take 1 capsule (20 mg total) by mouth daily.  30 capsule  6  . piroxicam (FELDENE) 10 MG capsule TAKE 1 CAPSULE BY MOUTH DAILY  30 capsule  PRN  . traZODone (DESYREL) 100 MG tablet Take 2 tablets (200 mg total) by mouth at bedtime.  60 tablet  3   Family History  Problem Relation Age of Onset  . Uterine cancer Mother   . Schizophrenia Son   . Diabetes    . Arthritis    . Hypertension     History   Social History  . Marital Status: Single    Spouse Name: N/A    Number of Children: N/A  . Years of Education: N/A   Occupational History  . CNA     worked for 15 years   Social History Main Topics  . Smoking status: Current Everyday Smoker -- 0.8 packs/day for 30 years    Types: Cigarettes  . Smokeless tobacco: Never Used  . Alcohol Use: No  . Drug Use: No  . Sexually Active: Not on file   Other Topics Concern  . Not on file   Social History Narrative   Financial assistance approved for 100% discount at [facility] and [facility]  September 29, 2010 2:27 PM   Review of Systems: Constitutional: Denies fever, chills, diaphoresis, appetite change and fatigue. + nausea HEENT: Denies photophobia, eye pain, redness, hearing loss, ear pain, congestion, sore throat, rhinorrhea, sneezing, mouth sores, trouble swallowing, neck stiffness and tinnitus.   Respiratory: Denies SOB, DOE, chest tightness,  and wheezing.  +cough plus white sputum Cardiovascular: Denies chest pain, palpitations and leg swelling.  Gastrointestinal: Denies nausea, vomiting, abdominal pain, diarrhea, constipation, blood in stool and abdominal distention.  Genitourinary: Denies dysuria, urgency, frequency, hematuria, flank pain and difficulty urinating.  Musculoskeletal: + diffuse body and joint pain.  Skin: Denies pallor, rash and wound.  Neurological: Denies dizziness, seizures, syncope, weakness, light-headedness,  numbness and headaches.  Hematological: Denies adenopathy. Easy bruising, personal or family bleeding history  Psychiatric/Behavioral: + hx depression and insomnia. Denies suicidal ideation, mood changes, confusion, nervousness, and agitation  Objective:  Physical Exam: Filed Vitals:   06/27/12 1336 06/27/12 1349  BP: 139/102 124/88  Pulse: 97 99  Temp: 97.9 F (36.6 C)   TempSrc: Oral   Weight: 296 lb 12.8  oz (134.628 kg)   SpO2: 94%    Constitutional: Vital signs reviewed.  Patient is a well-developed and well-nourished female in no acute distress and cooperative with exam. Alert and oriented x3.  Head: Normocephalic and atraumatic Mouth: no erythema or exudates, MMM.  Eyes: PERRL, EOMI, conjunctivae normal, No scleral icterus.  Neck: Supple, Trachea midline normal ROM, No JVD, mass, thyromegaly, or carotid bruit present.  Cardiovascular: RRR, S1 normal, S2 normal, no MRG, pulses symmetric and intact bilaterally Pulmonary/Chest: CTAB, no wheezes, rales, or rhonchi Abdominal: Soft. Non-tender, non-distended, obese, bowel sounds are normal, no masses, organomegaly, or guarding present.  GU: no CVA tenderness Musculoskeletal: + tenderness to palpation of entire spine, shoulders, and b/l lower extremities. + tenderness to palpation of left great toe.  Hematology: no cervical adenopathy.  Neurological: A&O x3, Strength is normal and symmetric bilaterally, cranial nerve II-XII are grossly intact, no focal motor deficit, sensory intact to light touch bilaterally. + tingling sensation in b/l feet. Skin: Warm, dry and intact. No rash, cyanosis, or clubbing.  Psychiatric: Normal mood and affect. speech and behavior is normal. Judgment and thought content normal. Cognition and memory are normal.   Assessment & Plan:   Ms. Dysert is a 51 year old African American female with extensive past medical history significant for chronic pain syndrome who presented today for cough x3 months and  Vicodin refill.

## 2012-06-27 NOTE — Progress Notes (Signed)
Lortab 7.5 rx faxed to [facility] on [address].

## 2012-06-27 NOTE — Assessment & Plan Note (Signed)
Advised for weight and diet control

## 2012-06-27 NOTE — Assessment & Plan Note (Signed)
Complained of cough with white sputum x3 months, switched Ace inhibitor to ARB (Hyzaar)  Will follow up to see if cough resolved

## 2012-06-27 NOTE — Assessment & Plan Note (Signed)
On gabapentin, not much relief, but bearable

## 2012-06-27 NOTE — Patient Instructions (Addendum)
Discontinue using Prinzide for high blood pressure Start taking Hyzaar for blood pressure to help with cough Vicodin being tapered, 4 tablets a day for one month and then follow up as need with pain clinic for full pain management Return to clinic for a follow up 3 months

## 2012-06-27 NOTE — Assessment & Plan Note (Addendum)
Regularly seen in at [facility] behavioral health.    One of her sons passed away several years ago from an MI, she claims to be depressed since them.  Is on Abilify, Cymbalta, Wellbutrin, and Trazadone.

## 2012-06-28 LAB — PRESCRIPTION ABUSE MONITORING 15P, URINE
Barbiturate Screen, Urine: NEGATIVE ng/mL
Benzodiazepine Screen, Urine: NEGATIVE ng/mL
Cannabinoid Scrn, Ur: NEGATIVE ng/mL
Cocaine Metabolites: NEGATIVE ng/mL
Creatinine, Urine: 296.56 mg/dL (ref >20.0)
Meperidine, Ur: NEGATIVE ng/mL
Methadone Screen, Urine: NEGATIVE ng/mL
Tramadol Scrn, Ur: NEGATIVE ng/mL
Zolpidem, Urine: NEGATIVE ng/mL

## 2012-06-29 NOTE — Progress Notes (Signed)
I interviewed patient with Dr. Virgina Organ and I agree with her note.  We should not presc ribe opiates to this patient.

## 2012-06-30 LAB — AMPHETAMINES (GC/LC/MS), URINE
Amphetamine GC/MS Conf: NEGATIVE ng/mL
MDA GC/MS confirm: NEGATIVE ng/mL
MDEA GC/MS Conf: NEGATIVE ng/mL
MDMA GC/MS Conf: NEGATIVE ng/mL
Methamphetamine Quant, Ur: NEGATIVE ng/mL

## 2012-06-30 LAB — OPIATES/OPIOIDS (LC/MS-MS)
Codeine Urine: NEGATIVE ng/mL
Hydrocodone: 615 ng/mL
Morphine Urine: NEGATIVE ng/mL

## 2012-07-17 ENCOUNTER — Encounter
Payer: Medicaid Other | Attending: Physical Medicine and Rehabilitation | Admitting: Physical Medicine and Rehabilitation

## 2012-07-17 ENCOUNTER — Encounter: Payer: Self-pay | Admitting: Physical Medicine and Rehabilitation

## 2012-07-17 VITALS — BP 136/88 | HR 105 | Resp 16 | Ht 70.0 in | Wt 296.0 lb

## 2012-07-17 DIAGNOSIS — M545 Low back pain, unspecified: Secondary | ICD-10-CM | POA: Insufficient documentation

## 2012-07-17 DIAGNOSIS — I10 Essential (primary) hypertension: Secondary | ICD-10-CM | POA: Insufficient documentation

## 2012-07-17 DIAGNOSIS — M79609 Pain in unspecified limb: Secondary | ICD-10-CM | POA: Insufficient documentation

## 2012-07-17 DIAGNOSIS — G894 Chronic pain syndrome: Secondary | ICD-10-CM

## 2012-07-17 DIAGNOSIS — M1710 Unilateral primary osteoarthritis, unspecified knee: Secondary | ICD-10-CM | POA: Insufficient documentation

## 2012-07-17 DIAGNOSIS — G8929 Other chronic pain: Secondary | ICD-10-CM | POA: Insufficient documentation

## 2012-07-17 DIAGNOSIS — M25569 Pain in unspecified knee: Secondary | ICD-10-CM | POA: Insufficient documentation

## 2012-07-17 DIAGNOSIS — M542 Cervicalgia: Secondary | ICD-10-CM | POA: Insufficient documentation

## 2012-07-17 DIAGNOSIS — F17200 Nicotine dependence, unspecified, uncomplicated: Secondary | ICD-10-CM | POA: Insufficient documentation

## 2012-07-17 DIAGNOSIS — E1149 Type 2 diabetes mellitus with other diabetic neurological complication: Secondary | ICD-10-CM | POA: Insufficient documentation

## 2012-07-17 DIAGNOSIS — E1142 Type 2 diabetes mellitus with diabetic polyneuropathy: Secondary | ICD-10-CM | POA: Insufficient documentation

## 2012-07-17 DIAGNOSIS — M76899 Other specified enthesopathies of unspecified lower limb, excluding foot: Secondary | ICD-10-CM | POA: Insufficient documentation

## 2012-07-17 DIAGNOSIS — R209 Unspecified disturbances of skin sensation: Secondary | ICD-10-CM | POA: Insufficient documentation

## 2012-07-17 NOTE — Patient Instructions (Signed)
Continue with your stretching exercises. 

## 2012-07-17 NOTE — Progress Notes (Signed)
Subjective:    Patient ID: [name], female    DOB: March 18, 1961, 51 y.o.   MRN: [id number]  HPI The patient complains about chronic neck pain, bilateral foot burning and tingling, knee pain, low back pain, and hand pain as well his right hip pain. Her problem has been stable.  She reports that she had surgery on her left foot for her plantar fasciitis. She continues to use home health assistance for some activities of daily living. She states, that she wants to get a prescription for narcotics, for her pain syndrome.  Pain Inventory Average Pain 8 Pain Right Now 8 My pain is sharp, stabbing and aching  In the last 24 hours, has pain interfered with the following? General activity 8 Relation with others 8 Enjoyment of life 10 What TIME of day is your pain at its worst? Morning, Dayimte, Evening Sleep (in general) Fair  Pain is worse with: walking, bending, sitting and standing Pain improves with: rest, medication, TENS and injections Relief from Meds: 8  Mobility use a walker ability to climb steps?  no do you drive?  yes  Function I need assistance with the following:  dressing, bathing, meal prep, household duties and shopping  Neuro/Psych trouble walking depression anxiety  Prior Studies Any changes since last visit?  no  Physicians involved in your care Any changes since last visit?  no   Family History  Problem Relation Age of Onset  . Uterine cancer Mother   . Schizophrenia Son   . Diabetes    . Arthritis    . Hypertension    . Heart attack Son 74    Died suddenly   History   Social History  . Marital Status: Single    Spouse Name: N/A    Number of Children: N/A  . Years of Education: N/A   Occupational History  . CNA     worked for 15 years   Social History Main Topics  . Smoking status: Current Everyday Smoker -- 0.8 packs/day for 35 years    Types: Cigarettes  . Smokeless tobacco: Never Used  . Alcohol Use: No  . Drug Use: No  .  Sexually Active: No     Celebate since 2000   Other Topics Concern  . None   Social History Narrative   Financial assistance approved for 100% discount at [facility] and has [facility]  September 29, 2010 2:27 PM   Past Surgical History  Procedure Date  . Abdominal hysterectomy     at age 58, unknown reasons  . Carpal tunnel release jan 2013    left side   Past Medical History  Diagnosis Date  . Depression     secondary to loss of her son at age 42  . DMII (diabetes mellitus, type 2)   . Hypertension   . Low back pain   . Hep C w/o coma, chronic   . Insomnia   . Substance abuse     sober since 2001  . Chronic neck pain   . Tobacco abuse   . Hepatitis C     diagnosed 2005  . Insomnia   . 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 Obesity   . Arthritis   . Lumbago   . Enthesopathy of hip region   . Primary localized osteoarthrosis, lower leg   . Chondromalacia of patella   . Cervicalgia   . Pain in joint, upper arm   . Diabetic neuropathy   .  Chronic pain syndrome   . Diabetes mellitus    BP 136/88  Pulse 105  Resp 16  Ht 5\' 10"  (1.778 m)  Wt 296 lb (134.265 kg)  BMI 42.47 kg/m2  SpO2 94%  LMP 12/06/1976      Review of Systems  Constitutional: Negative.   HENT: Positive for neck pain.   Eyes: Negative.   Respiratory: Negative.   Cardiovascular: Negative.   Gastrointestinal: Negative.   Genitourinary: Negative.   Musculoskeletal: Positive for back pain and gait problem.  Skin: Negative.   Neurological: Negative.   Hematological: Negative.   Psychiatric/Behavioral:       Depression/Anxiety       Objective:   Physical Exam Constitutional: She is oriented to person, place, and time. She appears well-developed and well-nourished.  HENT:  Head: Normocephalic.  Neck: Neck supple.  Musculoskeletal: She exhibits tenderness.  Neurological: She is alert and oriented to person, place, and time.  Skin: Skin is warm and dry.  Psychiatric: She  has a normal mood and affect.   Symmetric normal motor tone is noted throughout. Normal muscle bulk. Muscle testing reveals 5/5 muscle strength of the upper extremity, and 5/5 of the lower extremity. Full range of motion in upper and lower extremities, except shoulders bilateral, abduction 45 , flexion 45 in both shoulder. ROM of spine is restricted. Fine motor movements are normal in both hands.  DTR in the upper and lower extremity are present and symmetric 2+. No clonus is noted.  Patient arises from chair without difficulty. Narrow based gait with normal arm swing bilateral , able to walk on heels and toes . Tandem walk is stable. No pronator drift. Rhomberg negative.         Assessment & Plan:  1. Right low back pain  2 Neck pain with radiation to the shoulders: recent cervical MRI is reviewed in electronic medical record. Multilevel cervical spondylosis is appreciated.  3. Right hip trochanteric bursitis. This was injected 11/03/2011. This improved pain approximately 40% but is now recurring.may consider reinjection in the upcoming months.patient would like to be scheduled for reinjection next month.  4. Chronic burning foot pain x6-7 years consistent with diabetic peripheral neuropathy.  5. Bilateral knee osteoarthritis.  6. Bilateral hand achiness. She does have a history of left carpal tunnel surgery  Plan:   She is nonnarcotic management as she has an opioid risk tool greater than 8. I explained to her that I would not prescribe narcotics, because the risk of her abusing the medication would be too high. The patient wanted to be referred to another pain clinic, I gave her a list of other pain specialists in the region. She should tell us to whom she wants to be referred. I also told her , that we would continue to prescribe her non narcotic medication until she gets established in another pain clinic.

## 2012-08-01 ENCOUNTER — Encounter: Payer: Medicaid Other | Admitting: Internal Medicine

## 2012-08-03 ENCOUNTER — Other Ambulatory Visit: Payer: Self-pay | Admitting: *Deleted

## 2012-08-03 NOTE — Telephone Encounter (Signed)
Is this current 

## 2012-08-08 ENCOUNTER — Other Ambulatory Visit (HOSPITAL_COMMUNITY): Payer: Self-pay | Admitting: Internal Medicine

## 2012-08-08 DIAGNOSIS — Z1231 Encounter for screening mammogram for malignant neoplasm of breast: Secondary | ICD-10-CM

## 2012-09-04 ENCOUNTER — Other Ambulatory Visit: Payer: Self-pay | Admitting: *Deleted

## 2012-09-04 ENCOUNTER — Other Ambulatory Visit: Payer: Self-pay | Admitting: Internal Medicine

## 2012-09-04 MED ORDER — DICLOFENAC EPOLAMINE 1.3 % TD PTCH: 1.0000 | MEDICATED_PATCH | Freq: Two times a day (BID) | TRANSDERMAL | Status: DC

## 2012-09-04 NOTE — Telephone Encounter (Signed)
Message sent to front desk to schedule pt an appt. 

## 2012-09-04 NOTE — Telephone Encounter (Signed)
Needs routine F/U appt Dr Zannie Kehr / Nov

## 2012-09-18 ENCOUNTER — Ambulatory Visit (HOSPITAL_COMMUNITY)
Admission: RE | Admit: 2012-09-18 | Discharge: 2012-09-18 | Disposition: A | Discharge status: Home / Self Care | Payer: Medicaid Other | Source: Ambulatory Visit | Attending: Internal Medicine | Admitting: Internal Medicine

## 2012-09-18 DIAGNOSIS — Z1231 Encounter for screening mammogram for malignant neoplasm of breast: Secondary | ICD-10-CM

## 2012-10-02 ENCOUNTER — Other Ambulatory Visit: Payer: Self-pay | Admitting: Internal Medicine

## 2012-11-21 ENCOUNTER — Other Ambulatory Visit: Payer: Self-pay | Admitting: Internal Medicine

## 2012-11-21 NOTE — Telephone Encounter (Signed)
Not Internal Med pt.

## 2012-11-22 ENCOUNTER — Other Ambulatory Visit: Payer: Self-pay | Admitting: *Deleted

## 2012-11-22 DIAGNOSIS — E119 Type 2 diabetes mellitus without complications: Secondary | ICD-10-CM

## 2012-11-22 MED ORDER — MUPIROCIN 2 % EX OINT: TOPICAL_OINTMENT | Freq: Three times a day (TID) | CUTANEOUS | Status: DC

## 2012-11-22 NOTE — Telephone Encounter (Signed)
Pt has changed to another provider.  Refill was cancelled at [facility].  I spoke with Melissa, this order was not processed.

## 2012-11-22 NOTE — Addendum Note (Signed)
Addended by: Lars Mage on: 11/22/2012 11:26 AM   Modules accepted: Orders

## 2012-11-28 ENCOUNTER — Other Ambulatory Visit: Payer: Self-pay | Admitting: Internal Medicine

## 2013-05-16 ENCOUNTER — Other Ambulatory Visit: Payer: Self-pay | Admitting: Internal Medicine

## 2013-05-16 NOTE — Telephone Encounter (Signed)
prilosec is otc and does not need prescription.

## 2013-06-14 ENCOUNTER — Other Ambulatory Visit: Payer: Self-pay

## 2013-07-05 ENCOUNTER — Other Ambulatory Visit: Payer: Self-pay | Admitting: Internal Medicine

## 2013-07-05 NOTE — Telephone Encounter (Signed)
bactroban refill request

## 2014-07-11 ENCOUNTER — Ambulatory Visit: Payer: Self-pay | Admitting: Podiatry

## 2014-07-18 ENCOUNTER — Encounter: Payer: Self-pay | Admitting: Podiatry

## 2014-07-18 ENCOUNTER — Ambulatory Visit (INDEPENDENT_AMBULATORY_CARE_PROVIDER_SITE_OTHER): Payer: Medicaid Other | Admitting: Podiatry

## 2014-07-18 ENCOUNTER — Ambulatory Visit (INDEPENDENT_AMBULATORY_CARE_PROVIDER_SITE_OTHER): Payer: Medicaid Other

## 2014-07-18 VITALS — Ht 71.0 in | Wt 280.0 lb

## 2014-07-18 DIAGNOSIS — M79672 Pain in left foot: Principal | ICD-10-CM

## 2014-07-18 DIAGNOSIS — M2010 Hallux valgus (acquired), unspecified foot: Secondary | ICD-10-CM

## 2014-07-18 DIAGNOSIS — M79671 Pain in right foot: Secondary | ICD-10-CM

## 2014-07-18 DIAGNOSIS — M2040 Other hammer toe(s) (acquired), unspecified foot: Secondary | ICD-10-CM

## 2014-07-18 MED ORDER — TRIAMCINOLONE ACETONIDE 10 MG/ML IJ SUSP
10.0000 mg | Freq: Once | INTRAMUSCULAR | Status: AC
  Administered 2014-07-18: 10 mg

## 2014-07-18 NOTE — Patient Instructions (Signed)

## 2014-07-18 NOTE — Progress Notes (Signed)
Subjective:     Patient ID: [name], female   DOB: 08-Jul-1961, 53 y.o.   MRN: [id number]  HPI patient states I have painful bunion deformity on both feet but my heels are starting to hurt me not where we operated but in the center and outside when I get up in the morning or after periods of sitting. Patient is obese which is a certain complicating factor   Review of Systems  All other systems reviewed and are negative.      Objective:   Physical Exam Neurovascular status intact with muscle strength adequate and pain in the central and central lateral aspect of the heels of both feet. Moderate bunion deformity noted bilateral with some deviation of the big toes noted    Assessment:     Center and lateral band plan her fasciitis which has not worked on it previous visit and moderate structural bunion deformity    Plan:     H&P and x-rays reviewed with patient concerning bunion from the lateral side injected the heels 3 mg Kenalog 5 mg Xylocaine and applied fascially brace bilateral to support the arch and reappoint as needed

## 2014-07-18 NOTE — Progress Notes (Signed)
   Subjective:    Patient ID: [name], female    DOB: 1961-07-11, 53 y.o.   MRN: [id number]  HPI Comments: Pt complains of both heels hurting for over 3 months, she has been treating pain symptoms with Tylenol and muscle relaxers for other problems as well.  Pt states she had surgery for the heels couple years ago.  Pt states she is having pain in the B/L 1st MPJ areas for 1 years that is worsening.  Pt denies any treatment.     Review of Systems  All other systems reviewed and are negative.      Objective:   Physical Exam        Assessment & Plan:

## 2014-08-01 ENCOUNTER — Ambulatory Visit (INDEPENDENT_AMBULATORY_CARE_PROVIDER_SITE_OTHER): Payer: Medicaid Other | Admitting: Podiatry

## 2014-08-01 ENCOUNTER — Encounter: Payer: Self-pay | Admitting: Podiatry

## 2014-08-01 VITALS — BP 142/92 | HR 79 | Resp 16 | Ht 71.0 in | Wt 280.0 lb

## 2014-08-01 DIAGNOSIS — M79672 Pain in left foot: Principal | ICD-10-CM

## 2014-08-01 DIAGNOSIS — M79671 Pain in right foot: Secondary | ICD-10-CM

## 2014-08-01 DIAGNOSIS — M2010 Hallux valgus (acquired), unspecified foot: Secondary | ICD-10-CM

## 2014-08-01 DIAGNOSIS — M2040 Other hammer toe(s) (acquired), unspecified foot: Secondary | ICD-10-CM

## 2014-08-01 NOTE — Progress Notes (Signed)
Subjective:     Patient ID: Auriella Wieand, female   DOB: [birth year], 53 y.o.   MRN: [id number]  HPI patient states that they feel quite a bit better with continued structural damage of the bunion site and hammertoe noted   Review of Systems     Objective:   Physical Exam Neurovascular status intact with no change in health history with structural deformity noted around the first metatarsal and toes and minimal discomfort to the plantar heel both medial and lateral side    Assessment:     Improved fasciitis of both heels with structural deformity of both feet    Plan:     We are not been addressed structural deformity even though I did discuss it with her today and I did go ahead and advised on supportive shoe gear and continued usage of her fascially brace

## 2014-09-30 ENCOUNTER — Encounter: Payer: Self-pay | Admitting: Podiatry

## 2014-09-30 ENCOUNTER — Ambulatory Visit (INDEPENDENT_AMBULATORY_CARE_PROVIDER_SITE_OTHER): Payer: Medicaid Other | Admitting: Podiatry

## 2014-09-30 VITALS — BP 134/79 | HR 99 | Resp 16

## 2014-09-30 DIAGNOSIS — M79672 Pain in left foot: Secondary | ICD-10-CM

## 2014-09-30 DIAGNOSIS — M722 Plantar fascial fibromatosis: Secondary | ICD-10-CM

## 2014-09-30 DIAGNOSIS — M79671 Pain in right foot: Secondary | ICD-10-CM

## 2014-09-30 MED ORDER — TRIAMCINOLONE ACETONIDE 10 MG/ML IJ SUSP
10.0000 mg | Freq: Once | INTRAMUSCULAR | Status: AC
  Administered 2014-09-30: 10 mg

## 2014-09-30 NOTE — Progress Notes (Signed)
Subjective:     Patient ID: [name], female   DOB: 29-Jun-1961, 53 y.o.   MRN: [id number]  HPI patient presents stating on having pain in my right heel and   Review of Systems     Objective:   Physical Exam Neurovascular status are intact and unchanged with pain in the plantar aspect right heel at the insertion of a moderate nature    Assessment:     Continued plantar fasciitis plantar right    Plan:     Reinject the plantar fascia 3 mg Kenalog 5 mg Xylocaine and advised her reduced activity. Reappoint to recheck

## 2014-10-07 ENCOUNTER — Encounter: Payer: Self-pay | Admitting: Podiatry

## 2015-01-10 DIAGNOSIS — M722 Plantar fascial fibromatosis: Secondary | ICD-10-CM

## 2015-07-11 ENCOUNTER — Encounter: Payer: Self-pay | Admitting: Gastroenterology

## 2015-08-28 ENCOUNTER — Encounter: Payer: Self-pay | Admitting: Podiatry

## 2015-08-28 ENCOUNTER — Ambulatory Visit (INDEPENDENT_AMBULATORY_CARE_PROVIDER_SITE_OTHER): Payer: Medicare Other

## 2015-08-28 ENCOUNTER — Ambulatory Visit (INDEPENDENT_AMBULATORY_CARE_PROVIDER_SITE_OTHER): Payer: Medicare Other | Admitting: Podiatry

## 2015-08-28 DIAGNOSIS — M79671 Pain in right foot: Secondary | ICD-10-CM

## 2015-08-28 DIAGNOSIS — M79672 Pain in left foot: Secondary | ICD-10-CM | POA: Diagnosis not present

## 2015-08-28 DIAGNOSIS — S93409A Sprain of unspecified ligament of unspecified ankle, initial encounter: Secondary | ICD-10-CM

## 2015-08-28 DIAGNOSIS — M779 Enthesopathy, unspecified: Secondary | ICD-10-CM | POA: Diagnosis not present

## 2015-08-28 MED ORDER — TRIAMCINOLONE ACETONIDE 10 MG/ML IJ SUSP
10.0000 mg | Freq: Once | INTRAMUSCULAR | Status: AC
  Administered 2015-08-28: 10 mg

## 2015-08-28 NOTE — Progress Notes (Signed)
Subjective:     Patient ID: [name], female   DOB: 30-Jul-1961, 54 y.o.   MRN: [id number]  HPI patient states I developed a lot of pain on top of my foot and then also in the ankle on the inside it can get occasionally sore   Review of Systems     Objective:   Physical Exam Neurovascular status intact patient is obese which is complicating factor with long-term health problems diabetes and heel pain which is resolved with surgery. Dorsum of both feet are very tender when pressed and ankles can get sore on the medial side but no indications of long-term arthritis    Assessment:     Acute tendinitis dorsal of both feet with possible sprained ankles or other pathology of the ankles themselves    Plan:     H&P and both conditions reviewed. Today I did inject the dorsal tendon complex bilateral 3 mg Kenalog 5 mg Xylocaine and advised on heat therapy for this and then went ahead and discussed the ankles and supportive shoe gear and also ankle exercises. Reappoint to recheck as needed

## 2015-10-23 ENCOUNTER — Ambulatory Visit (INDEPENDENT_AMBULATORY_CARE_PROVIDER_SITE_OTHER): Payer: Medicare Other | Admitting: Podiatry

## 2015-10-23 ENCOUNTER — Encounter: Payer: Self-pay | Admitting: Podiatry

## 2015-10-23 VITALS — BP 135/77 | HR 87 | Resp 12

## 2015-10-23 DIAGNOSIS — M779 Enthesopathy, unspecified: Secondary | ICD-10-CM | POA: Diagnosis not present

## 2015-10-23 MED ORDER — TRIAMCINOLONE ACETONIDE 10 MG/ML IJ SUSP
10.0000 mg | Freq: Once | INTRAMUSCULAR | Status: AC
  Administered 2015-10-23: 10 mg

## 2015-10-23 NOTE — Progress Notes (Signed)
Subjective:     Patient ID: [name], female   DOB: 07-05-1961, 54 y.o.   MRN: [id number]  HPI patient states the top of my right foot has been hurting   Review of Systems     Objective:   Physical Exam Neurovascular status intact moderate obesity with inflammation around the dorsum of the right foot and ankle    Assessment:     Tendinitis condition    Plan:     Injected the tendinous area 3 mg Kenalog 5 mill grams Xylocaine and advised on physical therapy right foot and reappoint to recheck

## 2015-11-11 ENCOUNTER — Encounter (HOSPITAL_COMMUNITY): Payer: Self-pay | Admitting: Psychiatry

## 2015-11-11 ENCOUNTER — Telehealth (HOSPITAL_COMMUNITY): Payer: Self-pay

## 2015-11-11 ENCOUNTER — Ambulatory Visit (INDEPENDENT_AMBULATORY_CARE_PROVIDER_SITE_OTHER): Payer: Medicare Other | Admitting: Psychiatry

## 2015-11-11 DIAGNOSIS — F4310 Post-traumatic stress disorder, unspecified: Secondary | ICD-10-CM | POA: Diagnosis not present

## 2015-11-11 DIAGNOSIS — F332 Major depressive disorder, recurrent severe without psychotic features: Secondary | ICD-10-CM | POA: Diagnosis not present

## 2015-11-11 DIAGNOSIS — F329 Major depressive disorder, single episode, unspecified: Secondary | ICD-10-CM | POA: Insufficient documentation

## 2015-11-11 DIAGNOSIS — Z79899 Other long term (current) drug therapy: Secondary | ICD-10-CM

## 2015-11-11 DIAGNOSIS — G4700 Insomnia, unspecified: Secondary | ICD-10-CM

## 2015-11-11 DIAGNOSIS — F32A Depression, unspecified: Secondary | ICD-10-CM | POA: Insufficient documentation

## 2015-11-11 DIAGNOSIS — F411 Generalized anxiety disorder: Secondary | ICD-10-CM | POA: Diagnosis not present

## 2015-11-11 MED ORDER — ZOLPIDEM TARTRATE ER 12.5 MG PO TBCR: 12.5000 mg | EXTENDED_RELEASE_TABLET | Freq: Every evening | ORAL | Status: DC | PRN

## 2015-11-11 MED ORDER — ARIPIPRAZOLE 5 MG PO TABS: 5.0000 mg | ORAL_TABLET | Freq: Every day | ORAL | Status: DC

## 2015-11-11 MED ORDER — DULOXETINE HCL 60 MG PO CPEP: 120.0000 mg | ORAL_CAPSULE | Freq: Every day | ORAL | Status: DC

## 2015-11-11 MED ORDER — BUPROPION HCL 75 MG PO TABS: 75.0000 mg | ORAL_TABLET | Freq: Every morning | ORAL | Status: DC

## 2015-11-11 MED ORDER — HYDROXYZINE HCL 25 MG PO TABS: 25.0000 mg | ORAL_TABLET | Freq: Four times a day (QID) | ORAL | Status: DC | PRN

## 2015-11-11 NOTE — Patient Instructions (Signed)
1. Labs 2. EKG call [phone]  

## 2015-11-11 NOTE — Telephone Encounter (Signed)
Met with Dr. Doyne Keel to verify patient's Wellbutrin order after a message was left by [place] for verification.  Called patient's pharmacy back and verified with Tameka, pharmacist patient was only to be getting Wellbutrin 34m, one a day now as she informed Dr. ADoyne Keelthat is all she had been taking each day.  Called patient to inform this order would be filled now as was ordered this date.  Patient requested Dr. ADoyne Keelchange her Ambien medication as states her insurance will no longer approve it and needs something else for sleep. Informed Dr. ADoyne Keelwould be back in the office on 11/13/15 and would send request to her for a change.  Requested patient call back that date for follow up.
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
## 2015-11-13 NOTE — Telephone Encounter (Signed)
Telephone message left for patient Dr. Doyne Keel would approved regular Ambien 10mg  one at bedtime if she would like to try this and requested patient call back to confirm for order to be sent.

## 2015-11-13 NOTE — Telephone Encounter (Signed)
We can try regular Ambien 10mg .

## 2015-11-14 ENCOUNTER — Other Ambulatory Visit (HOSPITAL_COMMUNITY): Payer: Self-pay

## 2015-11-14 NOTE — Telephone Encounter (Signed)
that the pharmacy told her to ask for ambien 10mg  and see if insurance will cover.  pt has a new cell phone # [phone] and pt wants rx sent to [facility], [place], [place]

## 2015-11-18 MED ORDER — ZOLPIDEM TARTRATE 10 MG PO TABS
10.0000 mg | ORAL_TABLET | Freq: Every evening | ORAL | Status: DC | PRN
Start: 2015-11-18 — End: 2016-01-25

## 2015-11-18 NOTE — Telephone Encounter (Signed)
Telephone call with patient to discuss her desire to try regular Ambien 10 mg, one at bedtime for sleep.  Informed Dr. Doyne Keel approved the change to 10 mg, one at bedtime and agreed to call in new order plus one refill to last patient until her next evaluation on 01/12/15.  Requested patient call us back if any problems with regular Ambien as is changing due to insurance coverage and patient agreed with plan.  Called in Ambien 10 mg, one at bedtime as needed for sleep, #30 plus one refill to [facility] on [address] in [place] per patient's request with Benita Gutter, pharmacist.  Patient to no longer take Ambien 12.5 CR and will call if any problems with change.

## 2015-11-19 ENCOUNTER — Other Ambulatory Visit: Payer: Self-pay | Admitting: Pain Medicine

## 2015-11-26 ENCOUNTER — Encounter: Payer: Self-pay | Admitting: Pain Medicine

## 2015-11-26 ENCOUNTER — Ambulatory Visit: Payer: Medicare Other | Attending: Pain Medicine | Admitting: Pain Medicine

## 2015-11-26 VITALS — BP 96/67 | HR 71 | Temp 98.0°F | Resp 16 | Ht 71.0 in | Wt 294.0 lb

## 2015-11-26 DIAGNOSIS — M79605 Pain in left leg: Secondary | ICD-10-CM | POA: Diagnosis present

## 2015-11-26 DIAGNOSIS — M7060 Trochanteric bursitis, unspecified hip: Secondary | ICD-10-CM | POA: Diagnosis not present

## 2015-11-26 DIAGNOSIS — E1140 Type 2 diabetes mellitus with diabetic neuropathy, unspecified: Secondary | ICD-10-CM | POA: Diagnosis not present

## 2015-11-26 DIAGNOSIS — M47816 Spondylosis without myelopathy or radiculopathy, lumbar region: Secondary | ICD-10-CM | POA: Insufficient documentation

## 2015-11-26 DIAGNOSIS — F418 Other specified anxiety disorders: Secondary | ICD-10-CM | POA: Diagnosis not present

## 2015-11-26 DIAGNOSIS — G5600 Carpal tunnel syndrome, unspecified upper limb: Secondary | ICD-10-CM | POA: Insufficient documentation

## 2015-11-26 DIAGNOSIS — K219 Gastro-esophageal reflux disease without esophagitis: Secondary | ICD-10-CM | POA: Insufficient documentation

## 2015-11-26 DIAGNOSIS — M797 Fibromyalgia: Secondary | ICD-10-CM | POA: Diagnosis not present

## 2015-11-26 DIAGNOSIS — M79604 Pain in right leg: Secondary | ICD-10-CM | POA: Diagnosis present

## 2015-11-26 DIAGNOSIS — F17210 Nicotine dependence, cigarettes, uncomplicated: Secondary | ICD-10-CM | POA: Diagnosis not present

## 2015-11-26 DIAGNOSIS — I10 Essential (primary) hypertension: Secondary | ICD-10-CM | POA: Insufficient documentation

## 2015-11-26 DIAGNOSIS — M170 Bilateral primary osteoarthritis of knee: Secondary | ICD-10-CM | POA: Insufficient documentation

## 2015-11-26 DIAGNOSIS — M545 Low back pain: Secondary | ICD-10-CM | POA: Diagnosis present

## 2015-11-26 DIAGNOSIS — G588 Other specified mononeuropathies: Secondary | ICD-10-CM | POA: Diagnosis not present

## 2015-11-26 DIAGNOSIS — M5136 Other intervertebral disc degeneration, lumbar region: Secondary | ICD-10-CM | POA: Diagnosis not present

## 2015-11-26 DIAGNOSIS — M5416 Radiculopathy, lumbar region: Secondary | ICD-10-CM

## 2015-11-26 DIAGNOSIS — M533 Sacrococcygeal disorders, not elsewhere classified: Secondary | ICD-10-CM | POA: Diagnosis not present

## 2015-11-26 DIAGNOSIS — E0843 Diabetes mellitus due to underlying condition with diabetic autonomic (poly)neuropathy: Secondary | ICD-10-CM

## 2015-11-26 MED ORDER — KETOROLAC TROMETHAMINE 60 MG/2ML IM SOLN
INTRAMUSCULAR | Status: AC
  Filled 2015-11-26: qty 2

## 2015-11-26 MED ORDER — TRIAMCINOLONE ACETONIDE 40 MG/ML IJ SUSP: 40.0000 mg | Freq: Once | INTRAMUSCULAR | Status: DC

## 2015-11-26 MED ORDER — SODIUM CHLORIDE 0.9 % IJ SOLN: 20.0000 mL | Freq: Once | INTRAMUSCULAR | Status: DC

## 2015-11-26 MED ORDER — TRIAMCINOLONE ACETONIDE 40 MG/ML IJ SUSP
INTRAMUSCULAR | Status: AC
  Filled 2015-11-26: qty 1

## 2015-11-26 MED ORDER — SODIUM CHLORIDE 0.9 % IJ SOLN
INTRAMUSCULAR | Status: AC
  Filled 2015-11-26: qty 20

## 2015-11-26 NOTE — Progress Notes (Signed)
Subjective:    Patient ID: [name], female    DOB: 10-Apr-1961, 54 y.o.   MRN: [id number]  HPI  The patient is a 54 year old female who comes to pain management for at the request of Dr. Arvella Nigh for further evaluation and treatment of pain involving the lumbar and lower extremity regions. The patient states that her pain has progressively increased over the years. The patient states that the pain at the present time is severely incapacitating with pain involving the right lower extremity as well as the left lower extremity. The patient denies any recent trauma change in events of daily living to account for patient's symptomatology. Patient states that the pain involves the right lower extremity more than the left lower extremity and described pain as aching and utilizing a gnawing sensation that was deep sharp stabbing and tender. The patient states the pain awakened her from sleep and that the pain increases with bending climbing kneeling lifting intercourse motion standing squatting stooping twisting walking. The patient states the pain has decreased with biofeedback sleeping TENS unit use warm showers and baths hot packs and medications. We discussed patient's condition on today's visit and patient wished to proceed with interventional treatment in attempt to decrease severity of patient's symptoms, minimize progression of patient's symptoms, and avoid the need for more involved treatment. We discussed patient's condition and decision was made to proceed with cluneal and sciatic nerve blocks on today's visit. All were in agreement with suggested treatment plan. We also informed patient that we would like to obtain lumbar MRI and that we may consider PNCV EMG studies as well for further evaluation of patient's condition. We also informed patient we'll may consider surgical and neurological evaluations pending response to the present treatment and follow-up evaluations. The patient was  understanding and in agreement with suggested treatment plan.     Review of Systems    Cardiovascular: High blood pressure  Pulmonary: Positive cigarette smoking  Neurological: Unremarkable  Psychological: Anxiety Depression Insomnia  Gastrointestinal: Gastroesophageal reflux disease  Genitourinary: Unremarkable  Hematologic: Unremarkable  Endocrine: Diabetes mellitus  Rheumatological: Fibromyalgia  Musculoskeletal: Unremarkable  Other significant: Unremarkable      Objective:   Physical Exam  The patient was with tenderness to palpation of the splenius capitis and occipitalis musculature regions of mild degree. The patient was with unremarkable Spurling's maneuver and palpation of the acromioclavicular and glenohumeral joint regions reproduced minimal to mild discomfort. The patient appeared to be with bilaterally equal grip strength and Tinel and Phalen's maneuver were without increased pain of significant degree. The patient is status post carpal tunnel surgery. There was tenderness to palpation over the cervical paraspinal musculature region as well as the thoracic paraspinal musculature region with no crepitus of the thoracic region noted. Palpation over the lumbar paraspinal musculature region lumbar facet region was associated with moderate discomfort with lateral bending rotation extension and palpation over the lumbar facets reproducing moderate discomfort with severe tenderness to palpation over the PSIS and PII S regions as well as the gluteal and piriformis musculature regions right greater than the left. Straight leg raising was tolerates approximately 30 without a definite increased pain with dorsiflexion noted. He appeared to be negative clonus negative Homans. No definite sensory deficit or dermatomal distribution detected. There was tends to palpation of the greater trochanteric region iliotibial band region of moderate degree as well. The knees were  tenderness to palpation and crepitus of the knees were noted with negative anterior and posterior drawer signs without  ballottement of the patella. There was tenderness to palpation of the knees noted. There was negative clonus and negative Homans. Abdomen was nontender and no costovertebral tenderness was noted.         Assessment & Plan:      Degenerative disc disease lumbar spine  Gluteal and piriformis neuralgia  Lumbar facet syndrome  Sacroiliac joint dysfunction  Greater trochanteric bursitis  Carpal tunnel syndrome  DJD (knees)  Diabetic neuropathy                                                    Procedure: Cluneal and Sciatic Nerve Blocks  The patient is with pain involving the lumbar lower extremity region with severe tenderness to palpation of the gluteal and piriformis musculature regions. There is concern regarding component of patient's pain being due to gluteal and piriformis neuralgia The risks, benefits, and expectations of the procedure have been discussed and explained to the patient who was understanding and in agreement with suggested treatment plan. We will proceed with interventional treatment as discussed and as explained to the patient who wishes to proceed with proposed treatment.   PROCEDURE #1: Left cluneal nerve block with EKG, blood pressure, pulse, and pulse oximetry monitoring. The procedure was performed with the patient in the lateral decubitus position. Following alcohol prep of proposed entry site and identification of landmarks, a 22 -gauge needle was inserted into the gluteal musculature region and following elicitation of paresthesias radiating to the buttocks, needle was slightly withdrawn and following negative aspiration, a total of 4 mL of preservative-free normal saline with Kenalog injected for left  cluneal nerve block. Needle removed. The patient tolerated the injection well.   PROCEDURE #2: Left sciatic nerve block with EKG, blood  pressure, pulse, and pulse oximetry monitoring. The procedure was performed with the patient in the lateral decubitus position. Following identification of greater trochanter for establishing point of needle entry and alcohol prep of proposed entry site, a 22 -gauge needle was inserted and following elicitation of paresthesias radiating from buttocks to the foot, needle was slightly withdrawn. Following negative aspiration, a total of 4 mL of preservative-free normal saline with Kenalog injected for left sciatic nerve block. Needle removed. The patient tolerated injection well.  Cluneal and Sciatic Nerve Blocks on the Right Side:  The procedure was performed on the right side exactly as was performed on the left side and utilizing the same technique  The patient tolerated the procedure well  A total of 10 mg of Kenalog was utilized for the entire procedure  PLAN:   1. Medications: Will continue presently prescribed medications. 2. Will consider modification of treatment regimen pending response to treatment rendered on today's visit and follow-up evaluation. 3. The patient is to follow-up with primary care physician Dr.  Arvella Nigh regarding blood pressure diabetes mellitus and general medical condition status pose procedure performed on today's visit.. The patient has appointment to see primary care physician today 4. Surgical evaluation as discussed. 5. Neurological evaluation as discussed. 6. The patient may be a candidate for radiofrequency procedures, Botox injections, implantation type procedures, and other treatment pending response to treatment rendered on today's visit and follow-up evaluation. 7. The patient has been advised to adhere to proper body mechanics and avoid activities which appear to aggravate condition. 8. The patient has been advised to call the [facility] prior to scheduled return appointment should there be significant change in condition or should patient  have other concerns regarding condition prior to scheduled return appointment.  The patient is understanding and in agreement with suggested treatment plan.

## 2015-11-26 NOTE — Progress Notes (Signed)
Safety precautions to be maintained throughout the outpatient stay will include: orient to surroundings, keep bed in low position, maintain call bell within reach at all times, provide assistance with transfer out of bed and ambulation.  

## 2015-11-26 NOTE — Patient Instructions (Addendum)
Continue present medications   F/U PCP Dr.Sanchez-Brugal for evaliation of  BP diabetes mellitus  and general medical condition today as planned and as scheduled   Lumbar MRI to evaluate lumbar and lower extremity pain and paresthesias. Ask receptionist the date of your lumbar MRI. You may have lumbar MRI performed at the facility of your choice.  F/U surgical evaluation. May consider pending follow-up evaluations  F/U neurological evaluation. May consider pending follow-up evaluations . May consider PNCV/EMG studies and other studies  May consider radiofrequency rhizolysis or intraspinal procedures pending response to present treatment and F/U evaluation   Patient to call Pain Management Center should patient have concerns prior to scheduled return appointment. Trigger Point Injection Trigger points are areas where you have muscle pain. A trigger point injection is a shot given in the trigger point to relieve that pain. A trigger point might feel like a knot in your muscle. It hurts to press on a trigger point. Sometimes the pain spreads out (radiates) to other parts of the body. For example, pressing on a trigger point in your shoulder might cause pain in your arm or neck. You might have one trigger point. Or, you might have more than one. People often have trigger points in their upper back and lower back. They also occur often in the neck and shoulders. Pain from a trigger point lasts for a long time. It can make it hard to keep moving. You might not be able to do the exercise or physical therapy that could help you deal with the pain. A trigger point injection may help. It does not work for everyone. But, it may relieve your pain for a few days or a few months. A trigger point injection does not cure long-lasting (chronic) pain. LET YOUR CAREGIVER KNOW ABOUT:  Any allergies (especially to latex, lidocaine, or steroids).  Blood-thinning medicines that you take. These drugs can lead to bleeding  or bruising after an injection. They include:  Aspirin.  Ibuprofen.  Clopidogrel.  Warfarin.  Other medicines you take. This includes all vitamins, herbs, eyedrops, over-the-counter medicines, and creams.  Use of steroids.  Recent infections.  Past problems with numbing medicines.  Bleeding problems.  Surgeries you have had.  Other health problems. RISKS AND COMPLICATIONS A trigger point injection is a safe treatment. However, problems may develop, such as:  Minor side effects usually go away in 1 to 2 days. These may include:  Soreness.  Bruising.  Stiffness.  More serious problems are rare. But, they may include:  Bleeding under the skin (hematoma).  Skin infection.  Breaking off of the needle under your skin.  Lung puncture.  The trigger point injection may not work for you. BEFORE THE PROCEDURE You may need to stop taking any medicine that thins your blood. This is to prevent bleeding and bruising. Usually these medicines are stopped several days before the injection. No other preparation is needed. PROCEDURE  A trigger point injection can be given in your caregiver's office or in a clinic. Each injection takes 2 minutes or less.  Your caregiver will feel for trigger points. The caregiver may use a marker to circle the area for the injection.  The skin over the trigger point will be washed with a germ-killing (antiseptic) solution.  The caregiver pinches the spot for the injection.  Then, a very thin needle is used for the shot. You may feel pain or a twitching feeling when the needle enters the trigger point.  A numbing solution  may be injected into the trigger point. Sometimes a drug to keep down swelling, redness, and warmth (inflammation) is also injected.  Your caregiver moves the needle around the trigger zone until the tightness and twitching goes away.  After the injection, your caregiver may put gentle pressure over the injection  site.  Then it is covered with a bandage. AFTER THE PROCEDURE  You can go right home after the injection.  The bandage can be taken off after a few hours.  You may feel sore and stiff for 1 to 2 days.  Go back to your regular activities slowly. Your caregiver may ask you to stretch your muscles. Do not do anything that takes extra energy for a few days.  Follow your caregiver's instructions to manage and treat other pain.   This information is not intended to replace advice given to you by your health care provider. Make sure you discuss any questions you have with your health care provider.   Document Released: 11/11/2011 Document Revised: 03/19/2013 Document Reviewed: 11/11/2011 Elsevier Interactive Patient Education 2016 [employer] Pain Management Discharge Instructions  General Discharge Instructions :  If you need to reach your doctor call: Monday-Friday 8:00 am - 4:00 pm at [phone] or toll free [phone].  After clinic hours [phone] to have operator reach doctor.  Bring all of your medication bottles to all your appointments in the pain clinic.  To cancel or reschedule your appointment with Pain Management please remember to call 24 hours in advance to avoid a fee.  Refer to the educational materials which you have been given on: General Risks, I had my Procedure. Discharge Instructions, Post Sedation.  Post Procedure Instructions:  The drugs you were given will stay in your system until tomorrow, so for the next 24 hours you should not drive, make any legal decisions or drink any alcoholic beverages.  You may eat anything you prefer, but it is better to start with liquids then soups and crackers, and gradually work up to solid foods.  Please notify your doctor immediately if you have any unusual bleeding, trouble breathing or pain that is not related to your normal pain.  Depending on the type of procedure that was done, some parts of your body may  feel week and/or numb.  This usually clears up by tonight or the next day.  Walk with the use of an assistive device or accompanied by an adult for the 24 hours.  You may use ice on the affected area for the first 24 hours.  Put ice in a Ziploc bag and cover with a towel and place against area 15 minutes on 15 minutes off.  You may switch to heat after 24 hours.

## 2015-11-27 ENCOUNTER — Telehealth: Payer: Self-pay

## 2015-11-27 NOTE — Telephone Encounter (Signed)
Post procedure call - Left message to call if any questions or concerns.

## 2015-12-25 ENCOUNTER — Telehealth (HOSPITAL_COMMUNITY): Payer: Self-pay | Admitting: Pain Medicine

## 2015-12-26 ENCOUNTER — Ambulatory Visit (HOSPITAL_COMMUNITY): Admission: RE | Admit: 2015-12-26 | Payer: Medicare Other | Source: Ambulatory Visit

## 2015-12-30 ENCOUNTER — Ambulatory Visit (HOSPITAL_COMMUNITY)
Admission: RE | Admit: 2015-12-30 | Discharge: 2015-12-30 | Disposition: A | Discharge status: Home / Self Care | Payer: Medicare Other | Source: Ambulatory Visit | Attending: Pain Medicine | Admitting: Pain Medicine

## 2015-12-30 ENCOUNTER — Other Ambulatory Visit: Payer: Self-pay | Admitting: Pain Medicine

## 2015-12-30 DIAGNOSIS — M5136 Other intervertebral disc degeneration, lumbar region: Secondary | ICD-10-CM | POA: Insufficient documentation

## 2015-12-30 DIAGNOSIS — M7060 Trochanteric bursitis, unspecified hip: Secondary | ICD-10-CM | POA: Diagnosis not present

## 2015-12-30 DIAGNOSIS — M5137 Other intervertebral disc degeneration, lumbosacral region: Secondary | ICD-10-CM

## 2015-12-30 DIAGNOSIS — R202 Paresthesia of skin: Secondary | ICD-10-CM | POA: Insufficient documentation

## 2015-12-30 DIAGNOSIS — M4726 Other spondylosis with radiculopathy, lumbar region: Secondary | ICD-10-CM | POA: Insufficient documentation

## 2015-12-30 DIAGNOSIS — M7138 Other bursal cyst, other site: Secondary | ICD-10-CM | POA: Diagnosis not present

## 2015-12-30 DIAGNOSIS — M5416 Radiculopathy, lumbar region: Secondary | ICD-10-CM

## 2015-12-30 DIAGNOSIS — M545 Low back pain: Secondary | ICD-10-CM | POA: Diagnosis not present

## 2015-12-30 DIAGNOSIS — E0843 Diabetes mellitus due to underlying condition with diabetic autonomic (poly)neuropathy: Secondary | ICD-10-CM

## 2015-12-30 DIAGNOSIS — M47816 Spondylosis without myelopathy or radiculopathy, lumbar region: Secondary | ICD-10-CM

## 2015-12-30 NOTE — Telephone Encounter (Signed)
Attempted to call patient to inquire about blood thinners. Message left.

## 2015-12-31 ENCOUNTER — Telehealth: Payer: Self-pay | Admitting: *Deleted

## 2015-12-31 ENCOUNTER — Ambulatory Visit (HOSPITAL_COMMUNITY): Payer: Self-pay | Admitting: Clinical

## 2015-12-31 NOTE — Telephone Encounter (Signed)
Fax sent to PCP to stop Xarelto and request for PA sent to Pm-admin

## 2015-12-31 NOTE — Telephone Encounter (Signed)
Left message to please call our office. 

## 2016-01-01 ENCOUNTER — Other Ambulatory Visit: Payer: Self-pay | Admitting: Pain Medicine

## 2016-01-01 DIAGNOSIS — M47816 Spondylosis without myelopathy or radiculopathy, lumbar region: Secondary | ICD-10-CM

## 2016-01-01 DIAGNOSIS — M533 Sacrococcygeal disorders, not elsewhere classified: Secondary | ICD-10-CM

## 2016-01-01 DIAGNOSIS — M5416 Radiculopathy, lumbar region: Secondary | ICD-10-CM

## 2016-01-01 DIAGNOSIS — M5136 Other intervertebral disc degeneration, lumbar region: Secondary | ICD-10-CM

## 2016-01-01 DIAGNOSIS — Z7901 Long term (current) use of anticoagulants: Secondary | ICD-10-CM

## 2016-01-06 ENCOUNTER — Telehealth: Payer: Self-pay | Admitting: *Deleted

## 2016-01-06 NOTE — Telephone Encounter (Signed)
Left message with patient that we have not received authorization to d/c Xarelto prior to procedure on 01/12/16, if she would like to call their office to check on that.

## 2016-01-06 NOTE — Telephone Encounter (Signed)
Called DR Felicita Gage A. Laurin Coder M.D. Office to request information re; stopping Xorelto prior to procedure on 01/13/2016 with Dr Primus Bravo.  Message left on Dr Laurin Coder nurse voicemail to please let us know of decision to stop the medicine.

## 2016-01-07 NOTE — Telephone Encounter (Signed)
Patient called back / she had spoken with dr Laurin Coder and he said ok to stop meds for 3 days and restart  On tue     Call back at this number  [phone]

## 2016-01-09 ENCOUNTER — Ambulatory Visit (HOSPITAL_COMMUNITY): Payer: Medicare Other

## 2016-01-12 ENCOUNTER — Encounter: Payer: Self-pay | Admitting: Pain Medicine

## 2016-01-12 ENCOUNTER — Ambulatory Visit: Payer: Medicare Other | Attending: Pain Medicine | Admitting: Pain Medicine

## 2016-01-12 VITALS — BP 141/83 | HR 95 | Temp 97.9°F | Resp 16 | Ht 71.0 in | Wt 298.0 lb

## 2016-01-12 DIAGNOSIS — M79606 Pain in leg, unspecified: Secondary | ICD-10-CM | POA: Diagnosis present

## 2016-01-12 DIAGNOSIS — M7060 Trochanteric bursitis, unspecified hip: Secondary | ICD-10-CM

## 2016-01-12 DIAGNOSIS — M5136 Other intervertebral disc degeneration, lumbar region: Secondary | ICD-10-CM

## 2016-01-12 DIAGNOSIS — M47816 Spondylosis without myelopathy or radiculopathy, lumbar region: Secondary | ICD-10-CM | POA: Diagnosis not present

## 2016-01-12 DIAGNOSIS — M545 Low back pain: Secondary | ICD-10-CM | POA: Diagnosis present

## 2016-01-12 DIAGNOSIS — M5416 Radiculopathy, lumbar region: Secondary | ICD-10-CM

## 2016-01-12 DIAGNOSIS — E0843 Diabetes mellitus due to underlying condition with diabetic autonomic (poly)neuropathy: Secondary | ICD-10-CM

## 2016-01-12 MED ORDER — ORPHENADRINE CITRATE 30 MG/ML IJ SOLN
60.0000 mg | Freq: Once | INTRAMUSCULAR | Status: AC
  Administered 2016-01-12: 60 mg via INTRAMUSCULAR

## 2016-01-12 MED ORDER — TRIAMCINOLONE ACETONIDE 40 MG/ML IJ SUSP
40.0000 mg | Freq: Once | INTRAMUSCULAR | Status: AC
  Administered 2016-01-12: 40 mg

## 2016-01-12 MED ORDER — BUPIVACAINE HCL (PF) 0.25 % IJ SOLN
30.0000 mL | Freq: Once | INTRAMUSCULAR | Status: AC
Start: 2016-01-12 — End: 2016-01-12
  Administered 2016-01-12: 30 mL

## 2016-01-12 MED ORDER — FENTANYL CITRATE (PF) 100 MCG/2ML IJ SOLN
100.0000 ug | Freq: Once | INTRAMUSCULAR | Status: AC
  Administered 2016-01-12: 50 ug via INTRAVENOUS

## 2016-01-12 MED ORDER — MIDAZOLAM HCL 5 MG/5ML IJ SOLN
INTRAMUSCULAR | Status: AC
  Administered 2016-01-12: 4 mg via INTRAVENOUS
  Filled 2016-01-12: qty 5

## 2016-01-12 MED ORDER — SODIUM CHLORIDE 0.9% FLUSH: 20.0000 mL | Freq: Once | INTRAVENOUS | Status: DC

## 2016-01-12 MED ORDER — ORPHENADRINE CITRATE 30 MG/ML IJ SOLN
INTRAMUSCULAR | Status: AC
  Administered 2016-01-12: 60 mg via INTRAMUSCULAR
  Filled 2016-01-12: qty 2

## 2016-01-12 MED ORDER — FENTANYL CITRATE (PF) 100 MCG/2ML IJ SOLN
INTRAMUSCULAR | Status: AC
  Administered 2016-01-12: 50 ug via INTRAVENOUS
  Filled 2016-01-12: qty 2

## 2016-01-12 MED ORDER — CEFUROXIME AXETIL 250 MG PO TABS: 250.0000 mg | ORAL_TABLET | Freq: Two times a day (BID) | ORAL | Status: DC

## 2016-01-12 MED ORDER — CEFAZOLIN SODIUM 1-5 GM-% IV SOLN: 1.0000 g | Freq: Once | INTRAVENOUS | Status: DC

## 2016-01-12 MED ORDER — LACTATED RINGERS IV SOLN: 1000.0000 mL | INTRAVENOUS | Status: DC

## 2016-01-12 MED ORDER — BUPIVACAINE HCL (PF) 0.25 % IJ SOLN
INTRAMUSCULAR | Status: AC
  Administered 2016-01-12: 30 mL
  Filled 2016-01-12: qty 30

## 2016-01-12 MED ORDER — MIDAZOLAM HCL 5 MG/5ML IJ SOLN
5.0000 mg | Freq: Once | INTRAMUSCULAR | Status: AC
  Administered 2016-01-12: 4 mg via INTRAVENOUS

## 2016-01-12 MED ORDER — CEFAZOLIN SODIUM 1 G IJ SOLR
INTRAMUSCULAR | Status: AC
  Administered 2016-01-12: 1 g via INTRAVENOUS
  Filled 2016-01-12: qty 10

## 2016-01-12 MED ORDER — TRIAMCINOLONE ACETONIDE 40 MG/ML IJ SUSP
INTRAMUSCULAR | Status: AC
  Administered 2016-01-12: 40 mg
  Filled 2016-01-12: qty 1

## 2016-01-12 NOTE — Progress Notes (Signed)
Safety precautions to be maintained throughout the outpatient stay will include: orient to surroundings, keep bed in low position, maintain call bell within reach at all times, provide assistance with transfer out of bed and ambulation.  

## 2016-01-12 NOTE — Progress Notes (Signed)
Subjective:    Patient ID: [name], female    DOB: 07/17/1961, 55 y.o.   MRN: [id number]  HPI  PROCEDURE PERFORMED: Lumbar facet (medial branch block)   NOTE: The patient is a 55 y.o. female who returns to [facility] for further evaluation and treatment of pain involving the lumbar and lower extremity region. MRI  revealed the patient to be with evidence of L1-2: Mild facet hypertrophy without disc herniation or stenosis,  unchanged.  L2-3: Mild facet hypertrophy without disc herniation or stenosis,  unchanged.  L3-4: Mild facet hypertrophy without disc herniation or stenosis,  unchanged.  L4-5: Progressive, moderate facet arthrosis with a new 12 x 8 mm  cyst projecting medially from the left facet joint/ligamentum  flavum. This cyst focally indents the posterior thecal sac with mild  deflection of the intraspinal nerve roots. No significant overall  spinal stenosis or neural foraminal stenosis. No disc herniation.  There is also a new 12 mm synovial cyst projecting posterior  laterally from the inferior aspect of the left facet joint, not in a  position to cause neural impingement.  L5-S1: Mild-to-moderate facet arthrosis without disc herniation or  stenosis, unchanged. There is concern regarding significant component of patient's pain began due to facet arthropathy with facet syndrome The risks, benefits, and expectations of the procedure have been discussed and explained to the patient who was understanding and in agreement with suggested treatment plan. We will proceed with interventional treatment as discussed and as explained to the patient who was understanding and wished to proceed with procedure as planned.   DESCRIPTION OF PROCEDURE: Lumbar facet (medial branch block) with IV Versed, IV fentanyl conscious sedation, EKG, blood pressure, pulse, and pulse oximetry monitoring. The procedure was performed with the patient in the prone position. Betadine prep of  proposed entry site performed.   NEEDLE PLACEMENT AT: Left L 2 lumbar facet (medial branch block). Under fluoroscopic guidance with oblique orientation of 15 degrees, a 22-gauge needle was inserted at the L 2 vertebral body level with needle placed at the targeted area of Burton's Eye or Eye of the Scotty Dog with documentation of needle placement in the superior and lateral border of targeted area of Burton's Eye or Eye of the Scotty Dog with oblique orientation of 15 degrees. Following documentation of needle placement at the L 2 vertebral body level, needle placement was then accomplished at the L 3 vertebral body level.   NEEDLE PLACEMENT AT L3, L4, and L5 VERTEBRAL BODY LEVELS ON THE LEFT SIDE The procedure was performed at the L3, L4, and L5 vertebral body levels exactly as was performed at the L 2 vertebral body level utilizing the same technique and under fluoroscopic guidance.  NEEDLE PLACEMENT AT THE SACRAL ALA with AP view of the lumbosacral spine. With the patient in the prone position, Betadine prep of proposed entry site accomplished, a 22 gauge needle was inserted in the region of the sacral ala (groove formed by the superior articulating process of S1 and the sacral wing).  Needle placement was then verified at all levels on lateral view. Following documentation of needle placement at all levels on lateral view and following negative aspiration for heme and CSF, each level was injected with 1 mL of 0.125% bupivacaine with Kenalog.     LUMBAR FACET, MEDIAL BRANCH NERVE, BLOCKS PERFORMED ON THE RIGHT SIDE  The procedure was performed on the right side exactly as was performed on the left side at the same levels  and utilizing the same technique under fluoroscopic guidance.   Myoneural block injection of the gluteal musculature region Following Betadine prep proposed entry site a 22-gauge needle was inserted in the gluteal musculature region on the right and following negative  aspiration 2 cc of 0.125% ropivacaine with Norflex was injected for myoneural block injection of the gluteal musculature region times two.   The patient tolerated the procedure well  .A total of 40 mg of Kenalog was utilized for the procedure.   PLAN:  1. Medications: The patient will continue presently prescribed medications. 2. May consider modification of treatment regimen at time of return appointment pending response to treatment rendered on today's visit. 3. The patient is to follow-up with primary care physician Dr. Arvella Nigh   for further evaluation of blood pressure and general medical condition status post steroid injection performed on today's visit. 4. Surgical follow-up evaluation.Has been addressed  5. Neurological follow-up evaluation.May consider PNCV EMG studies  6. The patient may be candidate for radiofrequency procedures, implantation type procedures, and other treatment pending response to treatment and follow-up evaluation. 7. The patient has been advised to call the Pain Management Center prior to scheduled return appointment should there be significant change in condition or should patient have other concerns regarding condition prior to scheduled return appointment.  The patient is understanding and in agreement with suggested treatment plan.   Review of Systems     Objective:   Physical Exam        Assessment & Plan:

## 2016-01-12 NOTE — Patient Instructions (Addendum)
Continue present medications and get Ceftin antibiotic today and begin taking Ceftin antibiotic today as prescribed  F/U PCP Dr.Sanchez-Brugal for evaliation of  BP diabetes mellitus  and general medical condition today as planned and as scheduled   F/U surgical evaluation. May consider pending follow-up evaluations  F/U neurological evaluation. May consider pending follow-up evaluations . May consider PNCV/EMG studies and other studies  May consider radiofrequency rhizolysis or intraspinal procedures pending response to present treatment and F/U evaluation   Patient to call Pain Management Center should patient have concerns prior to scheduled return appointment. Facet Blocks Patient Information  Description: The facets are joints in the spine between the vertebrae.  Like any joints in the body, facets can become irritated and painful.  Arthritis can also effect the facets.  By injecting steroids and local anesthetic in and around these joints, we can temporarily block the nerve supply to them.  Steroids act directly on irritated nerves and tissues to reduce selling and inflammation which often leads to decreased pain.  Facet blocks may be done anywhere along the spine from the neck to the low back depending upon the location of your pain.   After numbing the skin with local anesthetic (like Novocaine), a small needle is passed onto the facet joints under x-ray guidance.  You may experience a sensation of pressure while this is being done.  The entire block usually lasts about 15-25 minutes.   Conditions which may be treated by facet blocks:   Low back/buttock pain  Neck/shoulder pain  Certain types of headaches  Preparation for the injection:  1. Do not eat any solid food or dairy products within 6 hours of your appointment. 2. You may drink clear liquid up to 2 hours before appointment.  Clear liquids include water, black coffee, juice or soda.  No milk or cream please. 3. You may  take your regular medication, including pain medications, with a sip of water before your appointment.  Diabetics should hold regular insulin (if taken separately) and take 1/2 normal NPH dose the morning of the procedure.  Carry some sugar containing items with you to your appointment. 4. A driver must accompany you and be prepared to drive you home after your procedure. 5. Bring all your current medications with you. 6. An IV may be inserted and sedation may be given at the discretion of the physician. 7. A blood pressure cuff, EKG and other monitors will often be applied during the procedure.  Some patients may need to have extra oxygen administered for a short period. 8. You will be asked to provide medical information, including your allergies and medications, prior to the procedure.  We must know immediately if you are taking blood thinners (like Coumadin/Warfarin) or if you are allergic to IV iodine contrast (dye).  We must know if you could possible be pregnant.  Possible side-effects:   Bleeding from needle site  Infection (rare, may require surgery)  Nerve injury (rare)  Numbness & tingling (temporary)  Difficulty urinating (rare, temporary)  Spinal headache (a headache worse with upright posture)  Light-headedness (temporary)  Pain at injection site (serveral days)  Decreased blood pressure (rare, temporary)  Weakness in arm/leg (temporary)  Pressure sensation in back/neck (temporary)   Call if you experience:   Fever/chills associated with headache or increased back/neck pain  Headache worsened by an upright position  New onset, weakness or numbness of an extremity below the injection site  Hives or difficulty breathing (go to the emergency room)  Inflammation or drainage at the injection site(s)  Severe back/neck pain greater than usual  New symptoms which are concerning to you  Please note:  Although the local anesthetic injected can often make your  back or neck feel good for several hours after the injection, the pain will likely return. It takes 3-7 days for steroids to work.  You may not notice any pain relief for at least one week.  If effective, we will often do a series of 2-3 injections spaced 3-6 weeks apart to maximally decrease your pain.  After the initial series, you may be a candidate for a more permanent nerve block of the facets.  If you have any questions, please call #[phone]) [facility] Dis[facility]  General Discharge Instructions :  If you need to reach your doctor call: Monday-Friday 8:00 am - 4:00 pm at [phone] or toll free [phone].  After clinic hours [phone] to have operator reach doctor.  Bring all of your medication bottles to all your appointments in the pain clinic.  To cancel or reschedule your appointment with Pain Management please remember to call 24 hours in advance to avoid a fee.  Refer to the educational materials which you have been given on: General Risks, I had my Procedure. Discharge Instructions, Post Sedation.  Post Procedure Instructions:  The drugs you were given will stay in your system until tomorrow, so for the next 24 hours you should not drive, make any legal decisions or drink any alcoholic beverages.  You may eat anything you prefer, but it is better to start with liquids then soups and crackers, and gradually work up to solid foods.  Please notify your doctor immediately if you have any unusual bleeding, trouble breathing or pain that is not related to your normal pain.  Depending on the type of procedure that was done, some parts of your body may feel week and/or numb.  This usually clears up by tonight or the next day.  Walk with the use of an assistive device or accompanied by an adult for the 24 hours.  You may use ice on the affected area for the first 24 hours.  Put ice in a Ziploc bag and cover  with a towel and place against area 15 minutes on 15 minutes off.  You may switch to heat after 24 hours.

## 2016-01-13 ENCOUNTER — Ambulatory Visit (HOSPITAL_COMMUNITY): Payer: Self-pay | Admitting: Psychiatry

## 2016-01-13 NOTE — Telephone Encounter (Signed)
Message left

## 2016-01-14 ENCOUNTER — Other Ambulatory Visit (HOSPITAL_COMMUNITY): Payer: Self-pay | Admitting: Psychiatry

## 2016-01-25 ENCOUNTER — Other Ambulatory Visit (HOSPITAL_COMMUNITY): Payer: Self-pay | Admitting: Psychiatry

## 2016-01-25 DIAGNOSIS — G4700 Insomnia, unspecified: Secondary | ICD-10-CM

## 2016-01-27 NOTE — Telephone Encounter (Signed)
I called the pharmacy to give a verbal order and the pharmacist informed me that patient picked up a prescription for ambien 12.5 written by a doctor at [facility]. I did not give the order and I called patient to let her know that it was not sent in due to her already having a prescription. I left a voicemail for patient and told her to call back with any questions.

## 2016-01-27 NOTE — Telephone Encounter (Signed)
Call in 30 day refill of Ambien. If pt reschedules again then no refills will be given until pt is seen in the clinic.

## 2016-01-28 ENCOUNTER — Ambulatory Visit: Payer: Self-pay | Admitting: Pain Medicine

## 2016-02-10 ENCOUNTER — Telehealth (HOSPITAL_COMMUNITY): Payer: Self-pay

## 2016-02-10 ENCOUNTER — Encounter (HOSPITAL_COMMUNITY): Payer: Self-pay | Admitting: Clinical

## 2016-02-10 ENCOUNTER — Ambulatory Visit (INDEPENDENT_AMBULATORY_CARE_PROVIDER_SITE_OTHER): Payer: 59 | Admitting: Clinical

## 2016-02-10 DIAGNOSIS — F4310 Post-traumatic stress disorder, unspecified: Secondary | ICD-10-CM | POA: Diagnosis not present

## 2016-02-10 DIAGNOSIS — F1421 Cocaine dependence, in remission: Secondary | ICD-10-CM

## 2016-02-10 DIAGNOSIS — F1490 Cocaine use, unspecified, uncomplicated: Secondary | ICD-10-CM | POA: Diagnosis not present

## 2016-02-10 DIAGNOSIS — F332 Major depressive disorder, recurrent severe without psychotic features: Secondary | ICD-10-CM | POA: Diagnosis not present

## 2016-02-10 DIAGNOSIS — F411 Generalized anxiety disorder: Secondary | ICD-10-CM

## 2016-02-10 MED ORDER — DULOXETINE HCL 60 MG PO CPEP: 120.0000 mg | ORAL_CAPSULE | Freq: Every day | ORAL | Status: DC

## 2016-02-10 MED ORDER — ARIPIPRAZOLE 5 MG PO TABS: 5.0000 mg | ORAL_TABLET | Freq: Every day | ORAL | Status: DC

## 2016-02-10 MED ORDER — BUPROPION HCL 75 MG PO TABS: 75.0000 mg | ORAL_TABLET | Freq: Every morning | ORAL | Status: DC

## 2016-02-10 MED ORDER — HYDROXYZINE HCL 25 MG PO TABS: 25.0000 mg | ORAL_TABLET | Freq: Four times a day (QID) | ORAL | Status: DC | PRN

## 2016-02-10 NOTE — Telephone Encounter (Signed)
Patient was here seeing Tharon Aquas and stopped by my office to let me know that she needs a refill on Abilify, Wellbutrin, Ambien, and Cymbalta. The patient has a f/u with you on 4/4 but does not have enough medication until that time. Okay to refill? Please advise, thank you

## 2016-02-10 NOTE — Telephone Encounter (Signed)
I e-scribed 4 prescriptions, Abilify, Wellbutrin, Cymbalta and Hydroxyzine to the [facility] in [place]. I did not refill ambien as the patient told me she just got a prescription for this at her PCP office.

## 2016-02-10 NOTE — Telephone Encounter (Signed)
Yes- give enough to get to scheduled appt. If pt misses appt then no refills until she is evaluated in the clinic.
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
## 2016-03-09 ENCOUNTER — Ambulatory Visit (INDEPENDENT_AMBULATORY_CARE_PROVIDER_SITE_OTHER): Payer: 59 | Admitting: Psychiatry

## 2016-03-09 ENCOUNTER — Encounter (HOSPITAL_COMMUNITY): Payer: Self-pay | Admitting: Psychiatry

## 2016-03-09 VITALS — BP 138/84 | HR 94 | Ht 71.0 in | Wt 288.2 lb

## 2016-03-09 DIAGNOSIS — F4310 Post-traumatic stress disorder, unspecified: Secondary | ICD-10-CM | POA: Diagnosis not present

## 2016-03-09 DIAGNOSIS — F411 Generalized anxiety disorder: Secondary | ICD-10-CM | POA: Diagnosis not present

## 2016-03-09 DIAGNOSIS — F332 Major depressive disorder, recurrent severe without psychotic features: Secondary | ICD-10-CM

## 2016-03-09 MED ORDER — BUPROPION HCL 75 MG PO TABS: 75.0000 mg | ORAL_TABLET | Freq: Every morning | ORAL | Status: DC

## 2016-03-09 MED ORDER — QUETIAPINE FUMARATE 50 MG PO TABS: 50.0000 mg | ORAL_TABLET | Freq: Every day | ORAL | Status: DC

## 2016-03-09 MED ORDER — DULOXETINE HCL 60 MG PO CPEP: 120.0000 mg | ORAL_CAPSULE | Freq: Every day | ORAL | Status: DC

## 2016-03-09 NOTE — Patient Instructions (Signed)
Call [phone] for EKG   Labs
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
## 2016-03-25 ENCOUNTER — Ambulatory Visit (INDEPENDENT_AMBULATORY_CARE_PROVIDER_SITE_OTHER): Payer: 59 | Admitting: Clinical

## 2016-03-25 DIAGNOSIS — F1490 Cocaine use, unspecified, uncomplicated: Secondary | ICD-10-CM | POA: Diagnosis not present

## 2016-03-25 DIAGNOSIS — F4310 Post-traumatic stress disorder, unspecified: Secondary | ICD-10-CM

## 2016-03-25 DIAGNOSIS — F1421 Cocaine dependence, in remission: Secondary | ICD-10-CM

## 2016-03-25 DIAGNOSIS — F332 Major depressive disorder, recurrent severe without psychotic features: Secondary | ICD-10-CM

## 2016-03-25 LAB — COMPREHENSIVE METABOLIC PANEL
ALBUMIN: 4.3 g/dL (ref 3.6–5.1)
ALK PHOS: 79 U/L (ref 33–130)
ALT: 16 U/L (ref 6–29)
AST: 15 U/L (ref 10–35)
BILIRUBIN TOTAL: 0.4 mg/dL (ref 0.2–1.2)
BUN: 18 mg/dL (ref 7–25)
CALCIUM: 8.4 mg/dL — AB (ref 8.6–10.4)
CO2: 27 mmol/L (ref 20–31)
Chloride: 105 mmol/L (ref 98–110)
Creat: 1.08 mg/dL — ABNORMAL HIGH (ref 0.50–1.05)
GLUCOSE: 98 mg/dL (ref 65–99)
POTASSIUM: 4.2 mmol/L (ref 3.5–5.3)
Sodium: 140 mmol/L (ref 135–146)
Total Protein: 6.7 g/dL (ref 6.1–8.1)

## 2016-03-25 LAB — LIPID PANEL
CHOL/HDL RATIO: 4.6 ratio (ref <=5.0)
CHOLESTEROL: 180 mg/dL (ref 125–200)
HDL: 39 mg/dL — ABNORMAL LOW (ref >=46)
LDL Cholesterol: 117 mg/dL (ref <130)
Triglycerides: 122 mg/dL (ref <150)
VLDL: 24 mg/dL (ref <30)

## 2016-03-25 LAB — CBC
HEMATOCRIT: 36.8 % (ref 35.0–45.0)
HEMOGLOBIN: 12.3 g/dL (ref 11.7–15.5)
MCH: 30.5 pg (ref 27.0–33.0)
MCHC: 33.4 g/dL (ref 32.0–36.0)
MCV: 91.3 fL (ref 80.0–100.0)
MPV: 10.9 fL (ref 7.5–12.5)
Platelets: 188 10*3/uL (ref 140–400)
RBC: 4.03 MIL/uL (ref 3.80–5.10)
RDW: 13.7 % (ref 11.0–15.0)
WBC: 5.2 10*3/uL (ref 3.8–10.8)

## 2016-03-25 LAB — TSH: TSH: 1.75 mIU/L

## 2016-03-25 LAB — PROLACTIN: PROLACTIN: 2.5 ng/mL

## 2016-03-25 LAB — HEMOGLOBIN A1C
HEMOGLOBIN A1C: 5.4 % (ref <5.7)
MEAN PLASMA GLUCOSE: 108 mg/dL

## 2016-04-01 NOTE — Progress Notes (Signed)
   THERAPIST PROGRESS NOTE  Session Time: 11:17-12:05  Participation Level: Active  Behavioral Response: CasualAlertDepressed  Type of Therapy: Individual Therapy  Treatment Goals addressed: improve psychiatric symptoms, relapse prevention Interventions: Motivational Interviewing,   Summary: [name] is a 55 y.o. female who presents with Major Depressive Disorder, severe without psychotic features, PTSD (Post Traumatic Stress Disorder) Cocaine Use Disorder, Moderate, in early remission.   Suicidal/Homicidal: Nowithout intent/plan  Therapist Response: Lourdes Sledge met with clinician for an individual session.[name] discussed her psychiatric symptoms, her current life events, and her goals for therapy. [name] shared that she has not been as depressed. Clinician asked open ended questions. [name] shared that being bored was her biggest issue. Client and clinician discussed her recovery. She shared she was doing "okay" but would not elaborate. Client and clinician discussed whether or not she would be interested in IOP - "no" and whether or not she was interested in attending NA . She shared that she thought about going to a meeting but hasn't gone to one yet. Clinician offered to give her a copy of a meeting schedule which she declined. Client and clinician discussed early recovery and boredom. Client and clinician discussed some relapse prevention skills. Clinician introduced some grounding and mindfulness techniques. Client and clinician discussed the purpose, and how to practice the skills. [name] shared that she would practice them daily until next session. [name] shared that she would like to return in 3 to 4 weeks  Plan: Return again in 3-4 weeks.  Diagnosis: Axis I: Major Depressive Disorder, severe without psychotic features, PTSD (Post Traumatic Stress Disorder) Cocaine Use Disorder, Moderate, in early remission.      Simonne Boulos A, LCSW 03/25/16

## 2016-04-01 NOTE — Progress Notes (Deleted)
   THERAPIST PROGRESS NOTE  Session Time: ***  Participation Level: {BHH PARTICIPATION LEVEL:22264}  Behavioral Response: {Appearance:22683}{BHH LEVEL OF CONSCIOUSNESS:22305}{BHH MOOD:22306}  Type of Therapy: {CHL AMB BH Type of Therapy:21022741}  Treatment Goals addressed: {CHL AMB BH Treatment Goals Addressed:21022754}  Interventions: {CHL AMB BH Type of Intervention:21022753}  Summary: [name] is a 55 y.o. female who presents with ***.   Suicidal/Homicidal: {BHH YES OR NO:22294}{yes/no/with/without intent/plan:22693}  Therapist Response: ***  Plan: Return again in *** weeks.  Diagnosis: Axis I: {psych axis 1:31909}     Severe episode of recurrent major depressive disorder, without psychotic features ([place]) - Primary    ICD-9-CM: 296.33 ICD-10-CM: F33.2    PTSD (post-traumatic stress disorder)     ICD-9-CM: 309.81 ICD-10-CM: F43.10    Cocaine use disorder, moderate, in early remission     ICD-9-CM: 305.63 ICD-10-CM: F14.90         Rusell Meneely A, LCSW 04/01/2016

## 2016-04-02 ENCOUNTER — Encounter (HOSPITAL_COMMUNITY): Payer: Self-pay | Admitting: Clinical

## 2016-04-02 ENCOUNTER — Other Ambulatory Visit (HOSPITAL_COMMUNITY): Payer: Self-pay | Admitting: Psychiatry

## 2016-04-05 ENCOUNTER — Ambulatory Visit (INDEPENDENT_AMBULATORY_CARE_PROVIDER_SITE_OTHER): Payer: Medicare Other | Admitting: Podiatry

## 2016-04-05 ENCOUNTER — Encounter: Payer: Self-pay | Admitting: Podiatry

## 2016-04-05 ENCOUNTER — Ambulatory Visit (INDEPENDENT_AMBULATORY_CARE_PROVIDER_SITE_OTHER): Payer: Medicare Other

## 2016-04-05 DIAGNOSIS — M779 Enthesopathy, unspecified: Secondary | ICD-10-CM | POA: Diagnosis not present

## 2016-04-05 DIAGNOSIS — M722 Plantar fascial fibromatosis: Secondary | ICD-10-CM

## 2016-04-05 MED ORDER — TRIAMCINOLONE ACETONIDE 10 MG/ML IJ SUSP
10.0000 mg | Freq: Once | INTRAMUSCULAR | Status: AC
  Administered 2016-04-05: 10 mg

## 2016-04-07 NOTE — Progress Notes (Signed)
Subjective:     Patient ID: [name], female   DOB: [birth year], 55 y.o.   MRN: [id number]  HPI patient presents stating I'm having a lot of pain on top of both my feet and it was better for about 45 months but has reoccurred   Review of Systems     Objective:   Physical Exam Neurovascular status intact muscle strength adequate with tendinitis of the dorsal bilateral feet that's improved but is still present when pressed    Assessment:     Tendinitis still noted dorsally but improved and does seem to respond to medication    Plan:     Injected the dorsal complex bilateral 3 mg Kenalog 5 mg Xylocaine advised on heat ice therapy and shoe gear that does not press too far into the foot

## 2016-04-15 ENCOUNTER — Ambulatory Visit (HOSPITAL_COMMUNITY): Payer: Self-pay | Admitting: Clinical

## 2016-05-06 ENCOUNTER — Other Ambulatory Visit (HOSPITAL_COMMUNITY): Payer: Self-pay | Admitting: Psychiatry

## 2016-05-11 ENCOUNTER — Ambulatory Visit (INDEPENDENT_AMBULATORY_CARE_PROVIDER_SITE_OTHER): Payer: 59 | Admitting: Psychiatry

## 2016-05-11 ENCOUNTER — Encounter (HOSPITAL_COMMUNITY): Payer: Self-pay | Admitting: Psychiatry

## 2016-05-11 VITALS — BP 132/82 | HR 79 | Ht 71.0 in | Wt 284.4 lb

## 2016-05-11 DIAGNOSIS — F332 Major depressive disorder, recurrent severe without psychotic features: Secondary | ICD-10-CM

## 2016-05-11 DIAGNOSIS — F4310 Post-traumatic stress disorder, unspecified: Secondary | ICD-10-CM

## 2016-05-11 DIAGNOSIS — F411 Generalized anxiety disorder: Secondary | ICD-10-CM | POA: Diagnosis not present

## 2016-05-11 MED ORDER — QUETIAPINE FUMARATE 100 MG PO TABS: 100.0000 mg | ORAL_TABLET | Freq: Every day | ORAL | Status: DC

## 2016-05-11 MED ORDER — DULOXETINE HCL 60 MG PO CPEP: 120.0000 mg | ORAL_CAPSULE | Freq: Every day | ORAL | Status: DC

## 2016-05-11 MED ORDER — BUPROPION HCL 75 MG PO TABS: 75.0000 mg | ORAL_TABLET | Freq: Every morning | ORAL | Status: DC
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
## 2016-05-25 ENCOUNTER — Encounter (HOSPITAL_COMMUNITY): Payer: Self-pay | Admitting: Clinical

## 2016-05-25 ENCOUNTER — Ambulatory Visit (INDEPENDENT_AMBULATORY_CARE_PROVIDER_SITE_OTHER): Payer: 59 | Admitting: Clinical

## 2016-05-25 DIAGNOSIS — F332 Major depressive disorder, recurrent severe without psychotic features: Secondary | ICD-10-CM | POA: Diagnosis not present

## 2016-05-25 DIAGNOSIS — F1490 Cocaine use, unspecified, uncomplicated: Secondary | ICD-10-CM

## 2016-05-25 DIAGNOSIS — F1421 Cocaine dependence, in remission: Secondary | ICD-10-CM

## 2016-05-25 DIAGNOSIS — F4310 Post-traumatic stress disorder, unspecified: Secondary | ICD-10-CM

## 2016-05-26 NOTE — Progress Notes (Signed)
   THERAPIST PROGRESS NOTE  Session Time: 10:15 -11:00  Participation Level: Active  Behavioral Response: CasualAlertDepressed  Type of Therapy: Individual Therapy  Treatment Goals addressed: Improve psychiatric symptoms, elevate mood, improve unhelpful thought patterns,  Interventions: motivational interviewing,   Summary: Jovanka Westgate is a 55 y.o. female who presents with severe episode of recurrent major depressive disorder, without psychotic features and PTSD, and Cocaine use disorder, moderate in early remission  Suicidal/Homicidal: No without intent/plan  Therapist Response: Boneta met with clinician for an individual session. Analeah discussed her psychiatric symptoms and  her current life events. Kenneisha stated that she has not relapsed.Melony shared that she has been sleeping better since starting Seroquel. She shared she is only waking up 2x a night rather than 5x. She shared that she is concerned about her balance. She stated she has almost fallen 3-4 times yesterday. She shared it has been increasing over the past 3-4 months. Clinician encouraged her to speak to her doctor about this. She stated she went to her doctor this week but forgot to mention it. Clinician suggest she call and let them know about it. Gilman Schmidt  Shared that she has been concerned about her son who has schizophrenia. She shared he won't take his medication and has not been acting right. Clinician asked open ended questions and she shared that she is determined to keep healthy boundaries with him, but thinking about it makes her depressed and concerned. Client and clinician discussed what was in her power to change and what was not. Client and clinician discussed Things she could do and things she is currently doing to elevate her mood.  Plan: Return again in 3-4 weeks.  Diagnosis: Axis I: major depressive disorder, without psychotic features and PTSD, and Cocaine use disorder, moderate in early  remission   Mozel Burdett A, LCSW 05/25/16

## 2016-06-11 ENCOUNTER — Telehealth: Payer: Self-pay | Admitting: *Deleted

## 2016-06-11 NOTE — Telephone Encounter (Signed)
"  This is [name].  Give me a call back."

## 2016-06-15 ENCOUNTER — Telehealth: Payer: Self-pay | Admitting: Podiatry

## 2016-06-15 NOTE — Telephone Encounter (Signed)
Pt returned call and just  wanted to know what days Dr Paulla Dolly does his surgery on. I told her tuesdays and said once the date was determined by Dr Paulla Dolly the surgery center would notify pt of the time.

## 2016-06-15 NOTE — Telephone Encounter (Signed)
I attempted to return patient's call.  Her home numbers were not in service and voicemail wasn't set up on mobile to leave a message.

## 2016-06-24 ENCOUNTER — Encounter (HOSPITAL_COMMUNITY): Payer: Self-pay | Admitting: Clinical

## 2016-06-24 ENCOUNTER — Ambulatory Visit (INDEPENDENT_AMBULATORY_CARE_PROVIDER_SITE_OTHER): Payer: Medicare Other | Admitting: Podiatry

## 2016-06-24 ENCOUNTER — Ambulatory Visit (INDEPENDENT_AMBULATORY_CARE_PROVIDER_SITE_OTHER): Payer: 59 | Admitting: Clinical

## 2016-06-24 VITALS — BP 114/67 | HR 83 | Resp 16

## 2016-06-24 DIAGNOSIS — M779 Enthesopathy, unspecified: Secondary | ICD-10-CM | POA: Diagnosis not present

## 2016-06-24 DIAGNOSIS — F1490 Cocaine use, unspecified, uncomplicated: Secondary | ICD-10-CM | POA: Diagnosis not present

## 2016-06-24 DIAGNOSIS — F332 Major depressive disorder, recurrent severe without psychotic features: Secondary | ICD-10-CM

## 2016-06-24 DIAGNOSIS — F1421 Cocaine dependence, in remission: Secondary | ICD-10-CM

## 2016-06-24 DIAGNOSIS — F4310 Post-traumatic stress disorder, unspecified: Secondary | ICD-10-CM | POA: Diagnosis not present

## 2016-06-24 MED ORDER — TRIAMCINOLONE ACETONIDE 10 MG/ML IJ SUSP
10.0000 mg | Freq: Once | INTRAMUSCULAR | Status: AC
  Administered 2016-06-24: 10 mg

## 2016-06-24 NOTE — Progress Notes (Signed)
   THERAPIST PROGRESS NOTE  Session Time: 10:30 -11:00  Participation Level: Active  Behavioral Response: CasualAlertDepressed  Type of Therapy: Individual Therapy  Treatment Goals addressed: Improve psychiatric symptoms, elevate mood ( increased energy, improved self-esteem, and decreased irritability), , improve unhelpful thought patterns, relapse prevention skills  Interventions: motivational interviewing, cbt,   Summary: [name] is a 55 y.o. female who presents with severe episode of recurrent major depressive disorder, without psychotic features and PTSD, and Cocaine use disorder, moderate in early remission  Suicidal/Homicidal: No without intent/plan  Therapist Response: Kyliegh met with clinician for an individual session. Antanisha discussed her psychiatric symptoms, her current life events and her homework. [name] shared that she has not completed her homework or practiced her grounding or mindfulness techniques. She shared that she has had her ups and down since last session. She shared that she has not relapsed. She shared that she would like to share her poetry on recovery. Client and clinician discussed possible platforms for her poetry ( a benefit to her recovery process). She shared that she has made some efforts to improve her health through exercising 1hr 5x a week. She shared that her foot has been hurting and her doctor told her that she might need to have surgery. [name] shared about some of the current challenges in her life. She shared about some of her negative automatic thoughts and emotions. [name] and clinician discussed the evidence for and against the thoughts. [name] was blet to formulate healthier alternative thoughts. Client and clinician discussed the process and how she could apply it to other negative thoughts.   Plan: Return again in 2-3 weeks.  Diagnosis: Axis I: major depressive disorder, without psychotic features and PTSD, and Cocaine use  disorder, moderate in early remission    Franke Menter A, LCSW 06/24/2016

## 2016-06-25 NOTE — Progress Notes (Signed)
Subjective:     Patient ID: [name], female   DOB: 1961/01/11, 55 y.o.   MRN: [id number]  HPI patient presents stating I'm still getting pain on top of my feet but it seems to be more to the outside   Review of Systems     Objective:   Physical Exam Neurovascular status intact with discomfort in the dorsal lateral aspect of left foot and right foot with tendinitis-like symptoms    Assessment:     Moderate obesity of the bilateral feet tendinitis-like symptoms    Plan:     Reviewed conditions and at this time went ahead and injected the lateral side 3 mg Kenalog 5 mg Xylocaine and advised on heat therapy and stretching exercises

## 2016-07-01 ENCOUNTER — Other Ambulatory Visit (HOSPITAL_COMMUNITY): Payer: Self-pay | Admitting: Psychiatry

## 2016-07-01 DIAGNOSIS — F4310 Post-traumatic stress disorder, unspecified: Secondary | ICD-10-CM

## 2016-07-01 DIAGNOSIS — F332 Major depressive disorder, recurrent severe without psychotic features: Secondary | ICD-10-CM

## 2016-07-01 DIAGNOSIS — F411 Generalized anxiety disorder: Secondary | ICD-10-CM

## 2016-07-29 ENCOUNTER — Other Ambulatory Visit (HOSPITAL_COMMUNITY): Payer: Self-pay | Admitting: Psychiatry

## 2016-07-29 DIAGNOSIS — F332 Major depressive disorder, recurrent severe without psychotic features: Secondary | ICD-10-CM

## 2016-08-11 ENCOUNTER — Ambulatory Visit (INDEPENDENT_AMBULATORY_CARE_PROVIDER_SITE_OTHER): Payer: 59 | Admitting: Clinical

## 2016-08-11 DIAGNOSIS — F1490 Cocaine use, unspecified, uncomplicated: Secondary | ICD-10-CM

## 2016-08-11 DIAGNOSIS — F1421 Cocaine dependence, in remission: Secondary | ICD-10-CM

## 2016-08-11 DIAGNOSIS — F332 Major depressive disorder, recurrent severe without psychotic features: Secondary | ICD-10-CM

## 2016-08-11 DIAGNOSIS — F4310 Post-traumatic stress disorder, unspecified: Secondary | ICD-10-CM | POA: Diagnosis not present
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
## 2016-08-12 ENCOUNTER — Ambulatory Visit (HOSPITAL_COMMUNITY): Payer: Self-pay | Admitting: Psychiatry

## 2016-08-17 ENCOUNTER — Encounter (HOSPITAL_COMMUNITY): Payer: Self-pay | Admitting: Clinical

## 2016-08-31 ENCOUNTER — Ambulatory Visit (INDEPENDENT_AMBULATORY_CARE_PROVIDER_SITE_OTHER): Payer: 59 | Admitting: Clinical

## 2016-08-31 ENCOUNTER — Other Ambulatory Visit (HOSPITAL_COMMUNITY): Payer: Self-pay | Admitting: Psychiatry

## 2016-08-31 DIAGNOSIS — F1421 Cocaine dependence, in remission: Secondary | ICD-10-CM

## 2016-08-31 DIAGNOSIS — F332 Major depressive disorder, recurrent severe without psychotic features: Secondary | ICD-10-CM

## 2016-08-31 DIAGNOSIS — F4310 Post-traumatic stress disorder, unspecified: Secondary | ICD-10-CM

## 2016-08-31 DIAGNOSIS — F411 Generalized anxiety disorder: Secondary | ICD-10-CM

## 2016-08-31 NOTE — Progress Notes (Signed)
   THERAPIST PROGRESS NOTE  Session Time: 8:05 - 9:00  Participation Level: Active  Behavioral Response: CasualAlertAnxious  Type of Therapy: Individual Therapy  Treatment Goals addressed: Improve psychiatric symptoms, elevate mood (decreased irritability),  improve unhelpful thought patterns, relapse prevention skills  Interventions: motivational interviewing, cbt, grounding and mindfulness techniques, psychoeducation  Summary: [name] is [name] 55 y.o. female who presents with severe episode of recurrent major depressive disorder, without psychotic features and PTSD, and Cocaine use disorder, moderate in early remission  Suicidal/Homicidal: No without intent/plan  Therapist Response: [name] met with clinician for an individual session. [name] discussed her psychiatric symptoms and her current life event. [name] shared that she had had ups and downs with her mental health. She shared that she had more downs because she is concerned [name] bout her son getting sentenced and other family issues. She shared she has been isolating some. Clinician asked open ended questions and [name] shared about her hopes for her son to receive mental health help and be able to return to the group home he was in before.  [name] shared her negative automatic thoughts client and clinician discussed these thoughts,  [name] was then able formulate healthier alternative thoughts.  [name] denied any relapse. She discussed her desire to begin exercising again to improve her mood. Clinician asked open ended questions and [name] identified things that would make it more likely for her to exercise. [name] shared that she was unlikely to do her homework packet so none was offered.  Plan: Return again in 3-4 weeks.  Diagnosis: Axis I: major depressive disorder, without psychotic features and PTSD, and Cocaine use disorder, moderate in early remission    Nicole Kloth A, LCSW 08/31/2016

## 2016-09-06 ENCOUNTER — Encounter (HOSPITAL_COMMUNITY): Payer: Self-pay | Admitting: Clinical

## 2016-09-24 ENCOUNTER — Ambulatory Visit: Payer: Medicare Other | Admitting: Podiatry

## 2016-09-28 ENCOUNTER — Other Ambulatory Visit (HOSPITAL_COMMUNITY): Payer: Self-pay | Admitting: Psychiatry

## 2016-09-28 DIAGNOSIS — F4310 Post-traumatic stress disorder, unspecified: Secondary | ICD-10-CM

## 2016-09-28 DIAGNOSIS — F411 Generalized anxiety disorder: Secondary | ICD-10-CM

## 2016-09-28 DIAGNOSIS — F332 Major depressive disorder, recurrent severe without psychotic features: Secondary | ICD-10-CM

## 2016-09-29 ENCOUNTER — Encounter: Payer: Self-pay | Admitting: Podiatry

## 2016-09-29 ENCOUNTER — Ambulatory Visit (INDEPENDENT_AMBULATORY_CARE_PROVIDER_SITE_OTHER): Payer: Medicare Other | Admitting: Podiatry

## 2016-09-29 ENCOUNTER — Ambulatory Visit (INDEPENDENT_AMBULATORY_CARE_PROVIDER_SITE_OTHER): Payer: Medicare Other

## 2016-09-29 DIAGNOSIS — M79672 Pain in left foot: Secondary | ICD-10-CM

## 2016-09-29 DIAGNOSIS — M79671 Pain in right foot: Secondary | ICD-10-CM | POA: Diagnosis not present

## 2016-09-29 DIAGNOSIS — M779 Enthesopathy, unspecified: Secondary | ICD-10-CM | POA: Diagnosis not present

## 2016-09-29 MED ORDER — TRIAMCINOLONE ACETONIDE 10 MG/ML IJ SUSP
10.0000 mg | Freq: Once | INTRAMUSCULAR | Status: AC
  Administered 2016-09-29: 10 mg

## 2016-09-30 ENCOUNTER — Ambulatory Visit (INDEPENDENT_AMBULATORY_CARE_PROVIDER_SITE_OTHER): Payer: 59 | Admitting: Clinical

## 2016-09-30 DIAGNOSIS — F1421 Cocaine dependence, in remission: Secondary | ICD-10-CM | POA: Diagnosis not present

## 2016-09-30 DIAGNOSIS — F332 Major depressive disorder, recurrent severe without psychotic features: Secondary | ICD-10-CM

## 2016-09-30 DIAGNOSIS — F4310 Post-traumatic stress disorder, unspecified: Secondary | ICD-10-CM | POA: Diagnosis not present

## 2016-09-30 NOTE — Progress Notes (Signed)
Subjective:     Patient ID: [name], female   DOB: 1961/05/19, 55 y.o.   MRN: [id number]  HPI patient states she did real well for a few months but has started to develop pain in the lateral side of both feet   Review of Systems     Objective:   Physical Exam Neurovascular status intact with discomfort in the lateral side of both feet around the peroneal tertius tendon complex with no muscle strength loss but moderate flatfoot and obesity is complicating factor    Assessment:     Tendinitis of the lateral side of the foot bilateral    Plan:     Careful sheath injection administered bilateral 3 mg Kenalog 5 mg Xylocaine and advised on ice therapy support therapy and will reappoint as needed

## 2016-09-30 NOTE — Progress Notes (Signed)
   THERAPIST PROGRESS NOTE  Session Time: 8:02 - 8:48  Participation Level: Active  Behavioral Response: CasualAlertDepressed   Treatment Goals addressed: Improve psychiatric symptoms, elevate mood, improve unhelpful thought patterns,   Interventions: motivational interviewing, cbt, grounding and mindfulness techniques, psychoeducation  Summary: [name] is a 55 y.o. female who presents with severe episode of recurrent major depressive disorder, without psychotic features and PTSD, and Cocaine use disorder, moderate in early remission  Suicidal/Homicidal: No without intent/plan  Therapist Response: [name] met with clinician for an individual session. [name] discussed her psychiatric symptoms, her current life events and her homework. [name] shared that she is experiencing depression and anxiety. Clinician asked open ended question and [name] shared that she is very concerned about her son. He is currently in [place] and she is concerned about his return home. Client and clinician discussed her concerns and options. [name] shared that she is also stressed due to health problems. She shared her concerns. Clinician asked open ended questions and she shared the steps she is going to take to take care of them. [name] shared that they upped her dose of Seroquel which she believes is helping. [name] denied any substance use. [name] discussed some negative thought she was experiencing client and clinician discussed the evidence for and against the thoughts recently was able to formulate healthier alternative thoughts.  Plan: Return again in 1-2 weeks.  Diagnosis: Axis I: major depressive disorder, without psychotic features and PTSD, and Cocaine use disorder, moderate in early remission   Diarra Kos A, LCSW 09/30/2016

## 2016-10-05 ENCOUNTER — Encounter (HOSPITAL_COMMUNITY): Payer: Self-pay | Admitting: Clinical

## 2016-10-14 ENCOUNTER — Encounter (HOSPITAL_COMMUNITY): Payer: Self-pay | Admitting: Psychiatry

## 2016-10-14 ENCOUNTER — Ambulatory Visit (INDEPENDENT_AMBULATORY_CARE_PROVIDER_SITE_OTHER): Payer: 59 | Admitting: Psychiatry

## 2016-10-14 DIAGNOSIS — Z833 Family history of diabetes mellitus: Secondary | ICD-10-CM

## 2016-10-14 DIAGNOSIS — Z808 Family history of malignant neoplasm of other organs or systems: Secondary | ICD-10-CM

## 2016-10-14 DIAGNOSIS — F411 Generalized anxiety disorder: Secondary | ICD-10-CM

## 2016-10-14 DIAGNOSIS — F332 Major depressive disorder, recurrent severe without psychotic features: Secondary | ICD-10-CM | POA: Diagnosis not present

## 2016-10-14 DIAGNOSIS — F17210 Nicotine dependence, cigarettes, uncomplicated: Secondary | ICD-10-CM | POA: Diagnosis not present

## 2016-10-14 DIAGNOSIS — F4310 Post-traumatic stress disorder, unspecified: Secondary | ICD-10-CM | POA: Diagnosis not present

## 2016-10-14 DIAGNOSIS — Z8261 Family history of arthritis: Secondary | ICD-10-CM

## 2016-10-14 DIAGNOSIS — Z818 Family history of other mental and behavioral disorders: Secondary | ICD-10-CM

## 2016-10-14 DIAGNOSIS — Z8249 Family history of ischemic heart disease and other diseases of the circulatory system: Secondary | ICD-10-CM

## 2016-10-14 DIAGNOSIS — Z813 Family history of other psychoactive substance abuse and dependence: Secondary | ICD-10-CM

## 2016-10-14 DIAGNOSIS — Z79899 Other long term (current) drug therapy: Secondary | ICD-10-CM

## 2016-10-14 DIAGNOSIS — Z811 Family history of alcohol abuse and dependence: Secondary | ICD-10-CM

## 2016-10-14 MED ORDER — BENZTROPINE MESYLATE 0.5 MG PO TABS: 0.5000 mg | ORAL_TABLET | Freq: Two times a day (BID) | ORAL | 3 refills | Status: DC

## 2016-10-14 MED ORDER — BUPROPION HCL 75 MG PO TABS: 75.0000 mg | ORAL_TABLET | Freq: Every morning | ORAL | 3 refills | Status: DC

## 2016-10-14 MED ORDER — DULOXETINE HCL 60 MG PO CPEP: 120.0000 mg | ORAL_CAPSULE | Freq: Every day | ORAL | 3 refills | Status: DC

## 2016-10-14 MED ORDER — QUETIAPINE FUMARATE 100 MG PO TABS: 100.0000 mg | ORAL_TABLET | Freq: Every day | ORAL | 3 refills | Status: DC
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
## 2016-11-03 ENCOUNTER — Other Ambulatory Visit: Payer: Self-pay | Admitting: Physical Medicine and Rehabilitation

## 2016-11-03 DIAGNOSIS — M545 Low back pain: Secondary | ICD-10-CM

## 2016-11-11 ENCOUNTER — Other Ambulatory Visit: Payer: Self-pay

## 2016-11-12 ENCOUNTER — Ambulatory Visit
Admission: RE | Admit: 2016-11-12 | Discharge: 2016-11-12 | Disposition: A | Discharge status: Home / Self Care | Payer: Medicare Other | Source: Ambulatory Visit | Attending: Physical Medicine and Rehabilitation | Admitting: Physical Medicine and Rehabilitation

## 2016-11-12 DIAGNOSIS — M545 Low back pain: Secondary | ICD-10-CM

## 2016-12-01 ENCOUNTER — Ambulatory Visit (INDEPENDENT_AMBULATORY_CARE_PROVIDER_SITE_OTHER): Payer: 59 | Admitting: Clinical

## 2016-12-01 DIAGNOSIS — F4310 Post-traumatic stress disorder, unspecified: Secondary | ICD-10-CM

## 2016-12-01 DIAGNOSIS — F1421 Cocaine dependence, in remission: Secondary | ICD-10-CM | POA: Diagnosis not present

## 2016-12-01 DIAGNOSIS — F332 Major depressive disorder, recurrent severe without psychotic features: Secondary | ICD-10-CM

## 2016-12-01 NOTE — Progress Notes (Signed)
   THERAPIST PROGRESS NOTE  Session Time: 8:00 -8:40  Participation Level: Active  Behavioral Response: CasualAlertDepressed  Type of Therapy: Individual Therapy  Treatment Goals addressed: Improve psychiatric symptoms, elevate mood ,improve unhelpful thought patterns, relapse prevention skills  Interventions: motivational interviewing, cbt,   Summary: [name] is a 55 y.o. female who presents with severe episode of recurrent major depressive disorder, without psychotic features and PTSD, and Cocaine use disorder, moderate in early remission  Suicidal/Homicidal: No without intent/plan  Therapist Response: [name] met with clinician for an individual session. [name] discussed her psychiatric symptoms and her current life events . [name] shared that she has been depressed and isolating. She shared that she has been missing her son a lot. He has been locked up for 3 months and she is not sure when he will be released. She shared that he has court on the 5th. She shared that she is concerned about whether or not his mental health is being taken care of (he has schizophrenia). She shared that she is very concerned because she used her son's food stamps while he was in jail and now need to pay them back. She shared that she has not used drugs but has put herself in a financial bind by going to the game rooms. Clinician asked open ended questions and [name] identified her solutions. One solution included moving. Client and clinician discussed relapse prevention skills (for drugs, and gambling). Clinician asked open ended questions about how [name] would like her life to look like and how she could create that. Clinician asked about hobbies and positive social interactions. [name] agreed to think about this,  Plan: Return again in 3-4 weeks.  Diagnosis: Axis I: major depressive disorder, without psychotic features and PTSD, and Cocaine use disorder, moderate in early  remission    [name] A, LCSW 12/01/2016

## 2016-12-03 ENCOUNTER — Encounter (HOSPITAL_COMMUNITY): Payer: Self-pay | Admitting: Clinical

## 2016-12-10 ENCOUNTER — Emergency Department (HOSPITAL_COMMUNITY): Payer: Medicare Other

## 2016-12-10 ENCOUNTER — Encounter (HOSPITAL_COMMUNITY): Payer: Self-pay | Admitting: Emergency Medicine

## 2016-12-10 ENCOUNTER — Emergency Department (HOSPITAL_COMMUNITY)
Admission: EM | Admit: 2016-12-10 | Discharge: 2016-12-11 | Disposition: A | Discharge status: Home / Self Care | Payer: Medicare Other | Attending: Emergency Medicine | Admitting: Emergency Medicine

## 2016-12-10 DIAGNOSIS — I951 Orthostatic hypotension: Secondary | ICD-10-CM | POA: Insufficient documentation

## 2016-12-10 DIAGNOSIS — I10 Essential (primary) hypertension: Secondary | ICD-10-CM | POA: Insufficient documentation

## 2016-12-10 DIAGNOSIS — F17210 Nicotine dependence, cigarettes, uncomplicated: Secondary | ICD-10-CM | POA: Diagnosis not present

## 2016-12-10 DIAGNOSIS — Z7901 Long term (current) use of anticoagulants: Secondary | ICD-10-CM | POA: Insufficient documentation

## 2016-12-10 DIAGNOSIS — E1140 Type 2 diabetes mellitus with diabetic neuropathy, unspecified: Secondary | ICD-10-CM | POA: Insufficient documentation

## 2016-12-10 DIAGNOSIS — R55 Syncope and collapse: Secondary | ICD-10-CM | POA: Diagnosis present

## 2016-12-10 LAB — CBC WITH DIFFERENTIAL/PLATELET
BASOS ABS: 0 10*3/uL (ref 0.0–0.1)
BASOS PCT: 1 %
Eosinophils Absolute: 0.4 10*3/uL (ref 0.0–0.7)
Eosinophils Relative: 5 %
HEMATOCRIT: 28.1 % — AB (ref 36.0–46.0)
HEMOGLOBIN: 9.7 g/dL — AB (ref 12.0–15.0)
LYMPHS PCT: 38 %
Lymphs Abs: 2.9 10*3/uL (ref 0.7–4.0)
MCH: 29 pg (ref 26.0–34.0)
MCHC: 34.5 g/dL (ref 30.0–36.0)
MCV: 84.1 fL (ref 78.0–100.0)
MONOS PCT: 11 %
Monocytes Absolute: 0.8 10*3/uL (ref 0.1–1.0)
NEUTROS ABS: 3.5 10*3/uL (ref 1.7–7.7)
NEUTROS PCT: 45 %
Platelets: 237 10*3/uL (ref 150–400)
RBC: 3.34 MIL/uL — ABNORMAL LOW (ref 3.87–5.11)
RDW: 13.2 % (ref 11.5–15.5)
WBC: 7.6 10*3/uL (ref 4.0–10.5)

## 2016-12-10 LAB — COMPREHENSIVE METABOLIC PANEL
ALBUMIN: 4.6 g/dL (ref 3.5–5.0)
ALK PHOS: 85 U/L (ref 38–126)
ALT: 21 U/L (ref 14–54)
ANION GAP: 12 (ref 5–15)
AST: 23 U/L (ref 15–41)
BUN: 25 mg/dL — ABNORMAL HIGH (ref 6–20)
CALCIUM: 7.8 mg/dL — AB (ref 8.9–10.3)
CO2: 23 mmol/L (ref 22–32)
Chloride: 99 mmol/L — ABNORMAL LOW (ref 101–111)
Creatinine, Ser: 1.42 mg/dL — ABNORMAL HIGH (ref 0.44–1.00)
GFR calc non Af Amer: 40 mL/min — ABNORMAL LOW (ref >60)
GFR, EST AFRICAN AMERICAN: 47 mL/min — AB (ref >60)
GLUCOSE: 88 mg/dL (ref 65–99)
POTASSIUM: 3 mmol/L — AB (ref 3.5–5.1)
SODIUM: 134 mmol/L — AB (ref 135–145)
Total Bilirubin: 0.5 mg/dL (ref 0.3–1.2)
Total Protein: 7.3 g/dL (ref 6.5–8.1)

## 2016-12-10 LAB — CBG MONITORING, ED: GLUCOSE-CAPILLARY: 116 mg/dL — AB (ref 65–99)

## 2016-12-10 NOTE — ED Provider Notes (Signed)
[facility] DEPT Provider Note   CSN: [id number] Arrival date & time: 12/10/16  1920  By signing my name below, I, Delton Prairie, attest that this documentation has been prepared under the direction and in the presence of Orpah Greek, MD  Electronically Signed: Delton Prairie, ED Scribe. 12/10/16. 11:57 PM.    History   Chief Complaint Chief Complaint  Patient presents with  . Loss of Consciousness    The history is provided by the patient. No language interpreter was used.   HPI Comments:  [name] is a 56 y.o. female, with a hx of DM and HTN, who presents to the Emergency Department s/p 3 episodes of syncope which lasted for a few minutes onset today. Pt state she feels lightheaded and dizzy prior to syncope. She also reports SOB, generalized body pain and notes she was stressed today. No alleviating factors noted. Pt denies hx of COPD, hx of lung problems, hx of blood clots, hx of hearty problems, any other associated symptoms and modifying factors at this time.    Past Medical History:  Diagnosis Date  . Arthritis   . Cervicalgia   . Chondromalacia of patella   . Chronic neck pain   . Chronic pain syndrome   . Depression    secondary to loss of her son at age 89  . Diabetes mellitus   . Diabetic neuropathy ([place])   . DMII (diabetes mellitus, type 2) ([place])   . Enthesopathy of hip region   . Hep C w/o coma, chronic ([place])   . Hepatitis C    diagnosed 2005  . Hypertension   . Insomnia   . Insomnia   . Low back pain   . Lumbago   . Obesity   . Pain in joint, upper arm   . Primary localized osteoarthrosis, lower leg   . Substance abuse    sober since 2001  . Tobacco abuse   . 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 DDD (degenerative disc disease), lumbar 11/26/2015  . Facet syndrome, lumbar 11/26/2015  . Greater trochanteric bursitis 11/26/2015  . Diabetic neuropathy ([place]) 11/26/2015   . Insomnia 11/11/2015  . PTSD (post-traumatic stress disorder) 11/11/2015  . GAD (generalized anxiety disorder) 11/11/2015  . Severe episode of recurrent major depressive disorder, without psychotic features ([place]) 11/11/2015  . Depression 11/11/2015  . Cough 06/27/2012  . Obesity 06/27/2012  . Tobacco use 06/27/2012  . Sacroiliac joint dysfunction 05/18/2012  . Right low back pain 04/19/2012  . Cervical neck pain with evidence of disc disease 02/11/2012  . Trochanteric bursitis of right hip 02/11/2012  . Plantar fasciitis 03/11/2011  . INSOMNIA 01/18/2008  . Chronic pain syndrome 12/15/2007  . DIABETIC PERIPHERAL NEUROPATHY 05/08/2007  . HEPATITIS C 02/27/2007  . DIABETES MELLITUS, TYPE II 02/27/2007  . DEPRESSION 02/27/2007  . HYPERTENSION 02/27/2007    Past Surgical History:  Procedure Laterality Date  . ABDOMINAL HYSTERECTOMY     at age 56, unknown reasons  . CARPAL TUNNEL RELEASE  jan 2013   left side  . FOOT SURGERY Bilateral     OB History    Gravida Para Term Preterm AB Living   2 2           SAB TAB Ectopic Multiple Live Births                   Home Medications    Prior to  Admission medications   Medication Sig Start Date End Date Taking? Authorizing Provider  albuterol (PROVENTIL HFA;VENTOLIN HFA) 108 (90 Base) MCG/ACT inhaler Inhale 2 puffs into the lungs every 6 (six) hours as needed for wheezing or shortness of breath.    Yes Historical Provider, MD  apixaban (ELIQUIS) 5 MG TABS tablet Take 5 mg by mouth 2 (two) times daily.    Yes Historical Provider, MD  atorvastatin (LIPITOR) 20 MG tablet Take 20 mg by mouth daily.   Yes Historical Provider, MD  benztropine (COGENTIN) 0.5 MG tablet Take 1 tablet (0.5 mg total) by mouth 2 (two) times daily. 10/14/16 10/14/17 Yes [name], MD  buPROPion (WELLBUTRIN) 75 MG tablet Take 1 tablet (75 mg total) by mouth every morning. 10/14/16  Yes [name], MD  Calcium Carbonate (CALCARB 600) 1500 MG TABS Take 1  tablet (1,500 mg total) by mouth 2 (two) times daily. 10/25/11  Yes Burman Freestone, MD  celecoxib (CELEBREX) 200 MG capsule Take 200 mg by mouth 2 (two) times daily.    Yes Historical Provider, MD  cholecalciferol (VITAMIN D-400) 400 UNITS TABS Take 1 tablet (400 Units total) by mouth daily. 09/29/11  Yes Heinz Knuckles, MD  diltiazem (DILACOR XR) 240 MG 24 hr capsule Take 240 mg by mouth daily.   Yes Historical Provider, MD  DULoxetine (CYMBALTA) 60 MG capsule Take 2 capsules (120 mg total) by mouth daily. 10/14/16  Yes [name], MD  ergocalciferol (VITAMIN D2) 50000 UNITS capsule Take 50,000 Units by mouth every Sunday.    Yes Historical Provider, MD  estrogens, conjugated, (PREMARIN) 0.3 MG tablet Take 0.3 mg by mouth daily. Take daily for 21 days then do not take for 7 days.   Yes Historical Provider, MD  GLIPIZIDE XL 10 MG 24 hr tablet TAKE 1 TABLET BY MOUTH EVERY DAY 09/04/12  Yes Bartholomew Crews, MD  hydroxypropyl methylcellulose / hypromellose (ISOPTO TEARS / GONIOVISC) 2.5 % ophthalmic solution Place 1 drop into both eyes 3 (three) times daily as needed for dry eyes.   Yes Historical Provider, MD  Iron-FA-B Cmp-C-Biot-Probiotic (FUSION PLUS PO) Take 1 tablet by mouth daily. Reported on 03/09/2016   Yes Historical Provider, MD  losartan-hydrochlorothiazide (HYZAAR) 100-25 MG tablet Take 1 tablet by mouth daily.   Yes Historical Provider, MD  metFORMIN (GLUCOPHAGE) 500 MG tablet Take 1 tablet (500 mg total) by mouth 2 (two) times daily with a meal. Patient taking differently: Take 1,000 mg by mouth 2 (two) times daily with a meal.  05/08/12  Yes Jenelle Mages, MD  mupirocin ointment (BACTROBAN) 2 % Apply topically 3 (three) times daily. Patient taking differently: Apply 1 application topically 2 (two) times daily.  11/22/12  Yes Janell Quiet, MD  omeprazole (PRILOSEC) 20 MG capsule Take 1 capsule (20 mg total) by mouth daily. Patient taking differently: Take 40 mg by mouth  daily.  06/07/12  Yes Bartholomew Crews, MD  Oxycodone HCl 10 MG TABS Take 10 mg by mouth 4 (four) times daily. Reported on 11/26/2015   Yes Historical Provider, MD  piroxicam (FELDENE) 10 MG capsule TAKE 1 CAPSULE BY MOUTH DAILY 06/06/12  Yes Milta Deiters, MD  pregabalin (LYRICA) 150 MG capsule Take 150 mg by mouth 2 (two) times daily.   Yes Historical Provider, MD  QUEtiapine (SEROQUEL) 100 MG tablet Take 1 tablet (100 mg total) by mouth at bedtime. Patient taking differently: Take 100 mg by mouth at bedtime.  10/14/16  Yes [name], MD  rivaroxaban (XARELTO) 20 MG TABS tablet Take 20 mg by mouth daily with supper.   Yes Historical Provider, MD  VOLTAREN 1 % GEL APP 1 GRAM EXT AA D UTD 11/24/16  Yes Historical Provider, MD  ACCU-CHEK FASTCLIX LANCETS MISC 1 each by Does not apply route daily. Check blood sugar once daily. 250.00 10/26/11   [name], DO  Blood Glucose Monitoring Suppl (ACCU-CHEK NANO SMARTVIEW) W/DEVICE KIT 1 each by Does not apply route 1 day or 1 dose. 09/29/11   Heinz Knuckles, MD  glucose blood (ACCU-CHEK SMARTVIEW) test strip Check blood sugar once daily. 250.00 10/26/11 10/25/12  [name], DO  Phentermine-Topiramate 3.75-23 MG CP24 Take by mouth. 08/23/16 09/06/16  Historical Provider, MD  Phentermine-Topiramate 7.5-46 MG CP24 Take by mouth. 08/23/16 09/22/16  Historical Provider, MD    Family History Family History  Problem Relation Age of Onset  . Uterine cancer Mother   . Alcohol abuse Father   . Alcohol abuse Sister   . Drug abuse Sister   . Drug abuse Brother   . Alcohol abuse Brother   . Schizophrenia Son   . Diabetes    . Arthritis    . Hypertension    . Heart attack Son 25    Died suddenly    Social History Social History  Substance Use Topics  . Smoking status: Current Every Day Smoker    Packs/day: 0.50    Years: 35.00    Types: Cigarettes  . Smokeless tobacco: Never Used  . Alcohol use Yes     Comment: one drink  every 6 months     Allergies   Hydrocodone-acetaminophen   Review of Systems Review of Systems  Respiratory: Positive for shortness of breath.   Cardiovascular: Positive for syncope.  Musculoskeletal: Positive for myalgias.  Neurological: Positive for dizziness, syncope and light-headedness.  All other systems reviewed and are negative.   Physical Exam Updated Vital Signs BP 108/73 (BP Location: Left Arm)   Pulse 108   Temp 97.7 F (36.5 C) (Oral)   Resp 19   Ht 5' 11"  (1.803 m)   Wt 290 lb (131.5 kg)   LMP 12/06/1976   SpO2 97%   BMI 40.45 kg/m   Physical Exam  Constitutional: She is oriented to person, place, and time. She appears well-developed and well-nourished. No distress.  HENT:  Head: Normocephalic and atraumatic.  Right Ear: Hearing normal.  Left Ear: Hearing normal.  Nose: Nose normal.  Mouth/Throat: Oropharynx is clear and moist and mucous membranes are normal.  Eyes: Conjunctivae and EOM are normal. Pupils are equal, round, and reactive to light.  Neck: Normal range of motion. Neck supple.  Cardiovascular: Regular rhythm, S1 normal and S2 normal.  Exam reveals no gallop and no friction rub.   No murmur heard. Pulmonary/Chest: Effort normal and breath sounds normal. No respiratory distress. She exhibits no tenderness.  Abdominal: Soft. Normal appearance and bowel sounds are normal. There is no hepatosplenomegaly. There is no tenderness. There is no rebound, no guarding, no tenderness at McBurney's point and negative Murphy's sign. No hernia.  Musculoskeletal: Normal range of motion.  Neurological: She is alert and oriented to person, place, and time. She has normal strength. No cranial nerve deficit or sensory deficit. Coordination normal. GCS eye subscore is 4. GCS verbal subscore is 5. GCS motor subscore is 6.  Skin: Skin is warm, dry and intact. No rash noted. No cyanosis.  Psychiatric: She has a normal mood and affect.  Her speech is normal and behavior  is normal. Thought content normal.  Nursing note and vitals reviewed.    ED Treatments / Results  DIAGNOSTIC STUDIES:  Oxygen Saturation is 98% on RA, normal by my interpretation.    COORDINATION OF CARE:  11:55 PM Discussed treatment plan with pt at bedside and pt agreed to plan.  Labs (all labs ordered are listed, but only abnormal results are displayed) Labs Reviewed  URINALYSIS, ROUTINE W REFLEX MICROSCOPIC - Abnormal; Notable for the following:       Result Value   Color, Urine AMBER (*)    APPearance HAZY (*)    All other components within normal limits  COMPREHENSIVE METABOLIC PANEL - Abnormal; Notable for the following:    Sodium 134 (*)    Potassium 3.0 (*)    Chloride 99 (*)    BUN 25 (*)    Creatinine, Ser 1.42 (*)    Calcium 7.8 (*)    GFR calc non Af Amer 40 (*)    GFR calc Af Amer 47 (*)    All other components within normal limits  CBC WITH DIFFERENTIAL/PLATELET - Abnormal; Notable for the following:    RBC 3.34 (*)    Hemoglobin 9.7 (*)    HCT 28.1 (*)    All other components within normal limits  CBG MONITORING, ED - Abnormal; Notable for the following:    Glucose-Capillary 116 (*)    All other components within normal limits    EKG  EKG Interpretation  Date/Time:  Friday December 10 2016 19:49:32 EST Ventricular Rate:  90 PR Interval:    QRS Duration: 96 QT Interval:  419 QTC Calculation: 513 R Axis:   47 Text Interpretation:  Sinus rhythm Ventricular premature complex Low voltage, precordial leads Borderline T wave abnormalities Prolonged QT interval Baseline wander in lead(s) I III aVL aVF V6 No significant change since last tracing Confirmed by Aicha Clingenpeel  MD, Hang Ammon ([id number]) on 12/11/2016 12:03:54 AM       Radiology No results found.  Procedures Procedures (including critical care time)  Medications Ordered in ED Medications  sodium chloride 0.9 % bolus 1,000 mL (0 mLs Intravenous Stopped 12/11/16 0427)     Initial Impression /  Assessment and Plan / ED Course  I have reviewed the triage vital signs and the nursing notes.  Pertinent labs & imaging results that were available during my care of the patient were reviewed by me and considered in my medical decision making (see chart for details).  Clinical Course   Patient presented to the ER for evaluation of syncope. Patient was noted to be significantly orthostatic here in the ER. This was the cause of her syncope. Patient was a minister to IV fluids with significant improvement. At this point she was felt appropriate for discharge and follow-up with primary doctor.  Final Clinical Impressions(s) / ED Diagnoses   Final diagnoses:  Syncope, unspecified syncope type  Orthostatic hypotension    New Prescriptions Discharge Medication List as of 12/11/2016  4:26 AM    I personally performed the services described in this documentation, which was scribed in my presence. The recorded information has been reviewed and is accurate.     Orpah Greek, MD 12/14/16 [phone]

## 2016-12-10 NOTE — ED Triage Notes (Signed)
Patient complaining of passing out. Patient states that she has not checked her blood sugar. Patient also cut her left palm and it has been bleeding off and on. Patient complaining of dry mouth and sob.

## 2016-12-11 DIAGNOSIS — I951 Orthostatic hypotension: Secondary | ICD-10-CM | POA: Diagnosis not present

## 2016-12-11 LAB — URINALYSIS, ROUTINE W REFLEX MICROSCOPIC
Bilirubin Urine: NEGATIVE
Glucose, UA: NEGATIVE mg/dL
Hgb urine dipstick: NEGATIVE
KETONES UR: NEGATIVE mg/dL
LEUKOCYTES UA: NEGATIVE
NITRITE: NEGATIVE
PROTEIN: NEGATIVE mg/dL
Specific Gravity, Urine: 1.011 (ref 1.005–1.030)
pH: 5 (ref 5.0–8.0)

## 2016-12-11 MED ORDER — SODIUM CHLORIDE 0.9 % IV BOLUS (SEPSIS)
1000.0000 mL | Freq: Once | INTRAVENOUS | Status: AC
  Administered 2016-12-11: 1000 mL via INTRAVENOUS

## 2016-12-11 NOTE — ED Notes (Signed)
Asked for urine multiple times

## 2016-12-30 DIAGNOSIS — R42 Dizziness and giddiness: Secondary | ICD-10-CM | POA: Insufficient documentation

## 2016-12-30 DIAGNOSIS — D649 Anemia, unspecified: Secondary | ICD-10-CM | POA: Insufficient documentation

## 2016-12-30 DIAGNOSIS — D500 Iron deficiency anemia secondary to blood loss (chronic): Secondary | ICD-10-CM | POA: Insufficient documentation

## 2016-12-30 DIAGNOSIS — Z791 Long term (current) use of non-steroidal anti-inflammatories (NSAID): Secondary | ICD-10-CM | POA: Insufficient documentation

## 2016-12-30 DIAGNOSIS — I480 Paroxysmal atrial fibrillation: Secondary | ICD-10-CM | POA: Insufficient documentation

## 2016-12-30 DIAGNOSIS — R55 Syncope and collapse: Secondary | ICD-10-CM

## 2016-12-30 DIAGNOSIS — M1990 Unspecified osteoarthritis, unspecified site: Secondary | ICD-10-CM | POA: Insufficient documentation

## 2017-01-17 DIAGNOSIS — E160 Drug-induced hypoglycemia without coma: Secondary | ICD-10-CM | POA: Insufficient documentation

## 2017-01-17 DIAGNOSIS — T383X1A Poisoning by insulin and oral hypoglycemic [antidiabetic] drugs, accidental (unintentional), initial encounter: Secondary | ICD-10-CM

## 2017-01-17 DIAGNOSIS — R9431 Abnormal electrocardiogram [ECG] [EKG]: Secondary | ICD-10-CM | POA: Insufficient documentation

## 2017-01-18 ENCOUNTER — Other Ambulatory Visit (HOSPITAL_COMMUNITY): Payer: Self-pay | Admitting: Psychiatry

## 2017-01-18 DIAGNOSIS — F332 Major depressive disorder, recurrent severe without psychotic features: Secondary | ICD-10-CM

## 2017-01-18 DIAGNOSIS — F411 Generalized anxiety disorder: Secondary | ICD-10-CM

## 2017-01-18 DIAGNOSIS — F4310 Post-traumatic stress disorder, unspecified: Secondary | ICD-10-CM

## 2017-01-20 ENCOUNTER — Ambulatory Visit (HOSPITAL_COMMUNITY): Payer: Self-pay | Admitting: Psychiatry

## 2017-02-03 ENCOUNTER — Ambulatory Visit (INDEPENDENT_AMBULATORY_CARE_PROVIDER_SITE_OTHER): Payer: 59 | Admitting: Clinical

## 2017-02-03 ENCOUNTER — Encounter (HOSPITAL_COMMUNITY): Payer: Self-pay | Admitting: Clinical

## 2017-02-03 DIAGNOSIS — F4310 Post-traumatic stress disorder, unspecified: Secondary | ICD-10-CM

## 2017-02-03 DIAGNOSIS — F1421 Cocaine dependence, in remission: Secondary | ICD-10-CM

## 2017-02-03 DIAGNOSIS — F332 Major depressive disorder, recurrent severe without psychotic features: Secondary | ICD-10-CM

## 2017-02-03 NOTE — Progress Notes (Signed)
   THERAPIST PROGRESS NOTE  Session Time: 8:12 - 9:00  Participation Level: Active  Behavioral Response: CasualAlertDepressed   Type of Therapy: Individual Therapy  Treatment Goals addressed: Improve psychiatric symptoms, elevate mood, improve unhelpful thought patterns,   Interventions: motivational interviewing,   Summary: Charlynn Salih is a 56 y.o. female who presents with severe episode of recurrent major depressive disorder, without psychotic features and PTSD, and Cocaine use disorder, moderate in early remission  Suicidal/Homicidal: No without intent/plan Therapist Response: Chairty met with clinician for an individual session. Altheia discussed her psychiatric symptomsand her current life events.  Bernardette shared that she has been feeling depressed. Clinician asked open ended questions and [name] shared that she has been having some medical issues. She shared she has gone to the hospital several times because she has been blacking out and passing out. She shared there is little or no warning sign and the medical staff has not yet figured out the cause. She denied any relapse. She also shared she was experiencing a lot of stress due to the fact her son has been released from jail but refuses to take his medication ( he does not live with her) and he has schizophrenia. Clinician asked open ended question and Polette identified steps she could take to improve her stress level including challenge unhelpful thoughts. Client and clinician discussed additional stress reduction techniques.   Plan: Return again in 2-3 weeks.  Diagnosis: Axis I: major depressive disorder, without psychotic features and PTSD, and Cocaine use disorder, moderate in early remission    Blackouts for about a month  No relapse - modifying medication, been the hospital several times - when walking  Davied Nocito A, LCSW 02/03/2017

## 2017-03-03 ENCOUNTER — Ambulatory Visit (INDEPENDENT_AMBULATORY_CARE_PROVIDER_SITE_OTHER): Payer: 59 | Admitting: Psychiatry

## 2017-03-03 ENCOUNTER — Encounter (HOSPITAL_COMMUNITY): Payer: Self-pay | Admitting: Psychiatry

## 2017-03-03 DIAGNOSIS — Z818 Family history of other mental and behavioral disorders: Secondary | ICD-10-CM | POA: Diagnosis not present

## 2017-03-03 DIAGNOSIS — F4310 Post-traumatic stress disorder, unspecified: Secondary | ICD-10-CM

## 2017-03-03 DIAGNOSIS — F17210 Nicotine dependence, cigarettes, uncomplicated: Secondary | ICD-10-CM

## 2017-03-03 DIAGNOSIS — Z888 Allergy status to other drugs, medicaments and biological substances status: Secondary | ICD-10-CM

## 2017-03-03 DIAGNOSIS — Z811 Family history of alcohol abuse and dependence: Secondary | ICD-10-CM | POA: Diagnosis not present

## 2017-03-03 DIAGNOSIS — F332 Major depressive disorder, recurrent severe without psychotic features: Secondary | ICD-10-CM | POA: Diagnosis not present

## 2017-03-03 DIAGNOSIS — Z79899 Other long term (current) drug therapy: Secondary | ICD-10-CM

## 2017-03-03 DIAGNOSIS — F411 Generalized anxiety disorder: Secondary | ICD-10-CM | POA: Diagnosis not present

## 2017-03-03 DIAGNOSIS — F1490 Cocaine use, unspecified, uncomplicated: Secondary | ICD-10-CM | POA: Diagnosis not present

## 2017-03-03 DIAGNOSIS — Z813 Family history of other psychoactive substance abuse and dependence: Secondary | ICD-10-CM

## 2017-03-03 MED ORDER — DULOXETINE HCL 60 MG PO CPEP: ORAL_CAPSULE | ORAL | 1 refills | Status: DC

## 2017-03-03 MED ORDER — BUPROPION HCL 100 MG PO TABS: 100.0000 mg | ORAL_TABLET | Freq: Every morning | ORAL | 1 refills | Status: DC

## 2017-03-03 MED ORDER — HYDROXYZINE PAMOATE 50 MG PO CAPS: 50.0000 mg | ORAL_CAPSULE | Freq: Every evening | ORAL | 1 refills | Status: DC | PRN

## 2017-03-03 NOTE — Progress Notes (Signed)
[place] MD/PA/NP OP Progress Note  03/03/2017 9:17 AM [name]  MRN:  [id number]  Chief Complaint:  Chief Complaint    Follow-up     HPI: Pt reports while walking she has had multiple syncopal episodes. She is working with her PCP to diagnosis the problem.  Pt denies depression. Denies anhedonia, isolation, crying spells, low motivation, poor hygiene, worthlessness and hopelessness. Denies SI/HI.  Sleep, appetite, energy are good.   She worries about her son a lot. Sister tells her she is anxious all the time. Pt states she feels anxious but can't give any details. She is scared to go outside due to multiple falls.   PTSD- states "I don't talk about it so I guess its ok".   Pt is worried that she is picking up bad habits like sucking on her tooth and on ice.  Taking meds as prescribed and denies SE.    Visit Diagnosis:    ICD-9-CM ICD-10-CM   1. Severe episode of recurrent major depressive disorder, without psychotic features ([place]) 296.33 F33.2 buPROPion (WELLBUTRIN) 100 MG tablet     DULoxetine (CYMBALTA) 60 MG capsule     DISCONTINUED: DULoxetine (CYMBALTA) 60 MG capsule     DISCONTINUED: buPROPion (WELLBUTRIN) 100 MG tablet  2. GAD (generalized anxiety disorder) 300.02 F41.1 DULoxetine (CYMBALTA) 60 MG capsule     hydrOXYzine (VISTARIL) 50 MG capsule     DISCONTINUED: DULoxetine (CYMBALTA) 60 MG capsule     DISCONTINUED: hydrOXYzine (VISTARIL) 50 MG capsule  3. PTSD (post-traumatic stress disorder) 309.81 F43.10 DULoxetine (CYMBALTA) 60 MG capsule     DISCONTINUED: DULoxetine (CYMBALTA) 60 MG capsule    Past Psychiatric History: see H&P  Past Medical History:  Past Medical History:  Diagnosis Date  . Arthritis   . Cervicalgia   . Chondromalacia of patella   . Chronic neck pain   . Chronic pain syndrome   . Depression    secondary to loss of her son at age 86  . Diabetes mellitus   . Diabetic neuropathy ([place])   . DMII (diabetes mellitus, type 2) ([place])   .  Enthesopathy of hip region   . Hep C w/o coma, chronic ([place])   . Hepatitis C    diagnosed 2005  . Hypertension   . Insomnia   . Insomnia   . Low back pain   . Lumbago   . Obesity   . Pain in joint, upper arm   . Primary localized osteoarthrosis, lower leg   . Substance abuse    sober since 2001  . Tobacco abuse   . 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 ABDOMINAL HYSTERECTOMY     at age 47, unknown reasons  . CARPAL TUNNEL RELEASE  jan 2013   left side  . FOOT SURGERY Bilateral     Family Psychiatric Hx:  Family History  Problem Relation Age of Onset  . Uterine cancer Mother   . Alcohol abuse Father   . Alcohol abuse Sister   . Drug abuse Sister   . Drug abuse Brother   . Alcohol abuse Brother   . Schizophrenia Son   . Diabetes    . Arthritis    . Hypertension    . Heart attack Son 14    Died suddenly    Social History:  Social History   Social History  . Marital status: Single    Spouse name: N/A  . Number of  children: N/A  . Years of education: N/A   Occupational History  . CNA     worked for 15 years   Social History Main Topics  . Smoking status: Current Every Day Smoker    Packs/day: 0.50    Years: 35.00    Types: Cigarettes  . Smokeless tobacco: Never Used  . Alcohol use Yes     Comment: one drink every 6 months  . Drug use: Yes    Types: "Crack" cocaine     Comment: last used Oct 2016  . Sexual activity: No     Comment: Celebate since 2000   Other Topics Concern  . None   Social History Narrative   Born and raised by mom until mom died when she was 14yo. After that she stayed with her aunt. Dad was married to someone else and was never around.  Pt has 1 brother and 1 sister and pt is the middle child. Pt had a very rough childhood. Never married. 2 kids one son died 3 yrs ago and other son has Schizophrenia. Pt has graduated HS and has some college. Pt is on disability and is  unemployed. 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       Financial assistance approved for 100% discount at [facility] and has [facility] card   [employer]  September 29, 2010 2:27 PM    Allergies:  Allergies  Allergen Reactions  . Hydrocodone-Acetaminophen Itching    Metabolic Disorder Labs: Lab Results  Component Value Date   HGBA1C 5.4 03/25/2016   MPG 108 03/25/2016   Lab Results  Component Value Date   PROLACTIN 2.5 03/25/2016   Lab Results  Component Value Date   CHOL 180 03/25/2016   TRIG 122 03/25/2016   HDL 39 (L) 03/25/2016   CHOLHDL 4.6 03/25/2016   VLDL 24 03/25/2016   LDLCALC 117 03/25/2016   LDLCALC 91 09/29/2011     Current Medications: Current Outpatient Prescriptions  Medication Sig Dispense Refill  . ACCU-CHEK FASTCLIX LANCETS MISC 1 each by Does not apply route daily. Check blood sugar once daily. 250.00 102 each 4  . albuterol (PROVENTIL HFA;VENTOLIN HFA) 108 (90 Base) MCG/ACT inhaler Inhale 2 puffs into the lungs every 6 (six) hours as needed for wheezing or shortness of breath.     Marland Kitchen apixaban (ELIQUIS) 5 MG TABS tablet Take 5 mg by mouth 2 (two) times daily.     Marland Kitchen atorvastatin (LIPITOR) 20 MG tablet Take 20 mg by mouth daily.    . benztropine (COGENTIN) 0.5 MG tablet TAKE 1 TABLET BY MOUTH TWICE DAILY 60 tablet 1  . Blood Glucose Monitoring Suppl (ACCU-CHEK NANO SMARTVIEW) W/DEVICE KIT 1 each by Does not apply route 1 day or 1 dose. 1 kit 0  . buPROPion (WELLBUTRIN) 75 MG tablet Take 1 tablet (75 mg total) by mouth every morning. 30 tablet 3  . Calcium Carbonate (CALCARB 600) 1500 MG TABS Take 1 tablet (1,500 mg total) by mouth 2 (two) times daily. 60 tablet 0  . celecoxib (CELEBREX) 200 MG capsule Take 200 mg by mouth 2 (two) times daily.     . cholecalciferol (VITAMIN D-400) 400 UNITS TABS Take 1 tablet (400 Units total) by mouth daily. 30 each 11  . diltiazem (DILACOR XR) 240 MG 24 hr capsule Take 240 mg by mouth daily.    . DULoxetine (CYMBALTA) 60 MG  capsule TAKE 2 CAPSULE BY MOUTH EVERY DAY 60 capsule 1  . ergocalciferol (VITAMIN  D2) 50000 UNITS capsule Take 50,000 Units by mouth every Sunday.     . estrogens, conjugated, (PREMARIN) 0.3 MG tablet Take 0.3 mg by mouth daily. Take daily for 21 days then do not take for 7 days.    . hydroxypropyl methylcellulose / hypromellose (ISOPTO TEARS / GONIOVISC) 2.5 % ophthalmic solution Place 1 drop into both eyes 3 (three) times daily as needed for dry eyes.    . Iron-FA-B Cmp-C-Biot-Probiotic (FUSION PLUS PO) Take 1 tablet by mouth daily. Reported on 03/09/2016    . mupirocin ointment (BACTROBAN) 2 % Apply topically 3 (three) times daily. (Patient taking differently: Apply 1 application topically 2 (two) times daily. ) 22 g 5  . omeprazole (PRILOSEC) 20 MG capsule Take 1 capsule (20 mg total) by mouth daily. (Patient taking differently: Take 40 mg by mouth daily. ) 30 capsule 6  . Oxycodone HCl 10 MG TABS Take 10 mg by mouth 4 (four) times daily. Reported on 11/26/2015    . piroxicam (FELDENE) 10 MG capsule TAKE 1 CAPSULE BY MOUTH DAILY 30 capsule PRN  . pregabalin (LYRICA) 150 MG capsule Take 150 mg by mouth 2 (two) times daily.    . QUEtiapine (SEROQUEL) 100 MG tablet Take 1 tablet (100 mg total) by mouth at bedtime. (Patient taking differently: Take 100 mg by mouth at bedtime. ) 30 tablet 3  . rivaroxaban (XARELTO) 20 MG TABS tablet Take 20 mg by mouth daily with supper.    Marland Kitchen VOLTAREN 1 % GEL APP 1 GRAM EXT AA D UTD  0  . glucose blood (ACCU-CHEK SMARTVIEW) test strip Check blood sugar once daily. 250.00 50 each 12   Current Facility-Administered Medications  Medication Dose Route Frequency Provider Last Rate Last Dose  . ceFAZolin (ANCEF) IVPB 1 g/50 mL premix  1 g Intravenous Once Mohammed Kindle, MD      . lactated ringers infusion 1,000 mL  1,000 mL Intravenous Continuous Mohammed Kindle, MD      . sodium chloride 0.9 % injection 20 mL  20 mL Other Once Mohammed Kindle, MD      . sodium chloride flush  (NS) 0.9 % injection 20 mL  20 mL Other Once Mohammed Kindle, MD      . triamcinolone acetonide (KENALOG-40) injection 40 mg  40 mg Other Once Mohammed Kindle, MD          Musculoskeletal: Strength & Muscle Tone: within normal limits Gait & Station: normal Patient leans: N/A  Psychiatric Specialty Exam: Review of Systems  Musculoskeletal: Positive for back pain, falls, joint pain and neck pain.  Neurological: Positive for tremors and loss of consciousness. Negative for dizziness, sensory change, seizures and headaches.  Psychiatric/Behavioral: Negative for depression, hallucinations, substance abuse and suicidal ideas. The patient is nervous/anxious. The patient does not have insomnia.     Blood pressure 130/78, pulse (!) 119, height 5' 11" (1.803 m), weight 262 lb 9.6 oz (119.1 kg), last menstrual period 12/06/1976.Body mass index is 36.63 kg/m.  General Appearance: Casual  Eye Contact:  Good  Speech:  Clear and Coherent and Normal Rate  Volume:  Normal  Mood:  Anxious  Affect:  Congruent  Thought Process:  Goal Directed and Descriptions of Associations: Intact  Orientation:  Full (Time, Place, and Person)  Thought Content: WDL   Suicidal Thoughts:  No  Homicidal Thoughts:  No  Memory:  Immediate;   Fair Recent;   Fair Remote;   Fair  Judgement:  Fair  Insight:  Fair  Psychomotor Activity:  Normal  Concentration:  Concentration: Good and Attention Span: Good  Recall:  Good  Fund of Knowledge: Good  Language: Good  Akathisia:  No  Handed:  Right  AIMS (if indicated):  n/a  Assets:  Communication Skills Desire for Improvement  ADL's:  Intact  Cognition: WNL  Sleep:  good     Treatment Plan Summary:Medication management and Plan see below  Assessment: MDD- recurrent, severe without psychotic features; GAD; PTSD   Medication management with supportive therapy. Risks/benefits and SE of the medication discussed. Pt verbalized understanding and verbal consent obtained for  treatment.  Affirm with the patient that the medications are taken as ordered. Patient expressed understanding of how their medications were to be used.   The risk of un-intended pregnancy is low based on the fact that pt reports she had a hysterectomy. Pt is aware that these meds carry a teratogenic risk. Pt will discuss plan of action if she does or plans to become pregnant in the future.   Meds: d/c Seroquel due to QTc 513 Start trial of Vistaril 58m po qHS for anxiety and sleep  Cymbalta 1219mpo qD for depression, anxiety and PTSD Increase Wellbutrin 1008mo qD for depression and anxiety D/c Cogentin    Labs: done on 12/10/2016 Hb 9.7, K 3, creat 1.42, EKG QTc 513    Therapy: brief supportive therapy provided. Discussed psychosocial stressors in detail.   Encouraged pt to develop daily routine and work on daily goal setting as a way to improve mood symptoms.  Reviewed sleep hygiene in detail Recommended pt stop all drug and alcohol use  Consultations:  Encouraged to continue individual therapy  Pt denies SI and is at an acute low risk for suicide. Patient told to call clinic if any problems occur. Patient advised to go to ER if they should develop SI/HI, side effects, or if symptoms worsen. Has crisis numbers to call if needed. Pt verbalized understanding.  F/up in 2 months or sooner if needed   SalCharlcie CradleD 03/03/2017, 9:17 AM

## 2017-05-04 DIAGNOSIS — M65342 Trigger finger, left ring finger: Secondary | ICD-10-CM | POA: Insufficient documentation

## 2017-05-05 ENCOUNTER — Ambulatory Visit (INDEPENDENT_AMBULATORY_CARE_PROVIDER_SITE_OTHER): Payer: 59 | Admitting: Psychiatry

## 2017-05-05 ENCOUNTER — Other Ambulatory Visit (HOSPITAL_COMMUNITY): Payer: Self-pay | Admitting: Psychiatry

## 2017-05-05 ENCOUNTER — Encounter (HOSPITAL_COMMUNITY): Payer: Self-pay | Admitting: Psychiatry

## 2017-05-05 DIAGNOSIS — F4310 Post-traumatic stress disorder, unspecified: Secondary | ICD-10-CM | POA: Diagnosis not present

## 2017-05-05 DIAGNOSIS — F17210 Nicotine dependence, cigarettes, uncomplicated: Secondary | ICD-10-CM

## 2017-05-05 DIAGNOSIS — Z813 Family history of other psychoactive substance abuse and dependence: Secondary | ICD-10-CM | POA: Diagnosis not present

## 2017-05-05 DIAGNOSIS — F332 Major depressive disorder, recurrent severe without psychotic features: Secondary | ICD-10-CM

## 2017-05-05 DIAGNOSIS — F411 Generalized anxiety disorder: Secondary | ICD-10-CM

## 2017-05-05 DIAGNOSIS — Z811 Family history of alcohol abuse and dependence: Secondary | ICD-10-CM

## 2017-05-05 DIAGNOSIS — Z818 Family history of other mental and behavioral disorders: Secondary | ICD-10-CM | POA: Diagnosis not present

## 2017-05-05 MED ORDER — LAMOTRIGINE 25 MG PO TABS: ORAL_TABLET | ORAL | 1 refills | Status: DC

## 2017-05-05 MED ORDER — DULOXETINE HCL 60 MG PO CPEP: ORAL_CAPSULE | ORAL | 1 refills | Status: DC
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
## 2017-05-24 ENCOUNTER — Other Ambulatory Visit (HOSPITAL_COMMUNITY): Payer: Self-pay | Admitting: Psychiatry

## 2017-06-23 ENCOUNTER — Other Ambulatory Visit (HOSPITAL_COMMUNITY): Payer: Self-pay | Admitting: Psychiatry

## 2017-06-23 DIAGNOSIS — F411 Generalized anxiety disorder: Secondary | ICD-10-CM

## 2017-06-30 ENCOUNTER — Ambulatory Visit (HOSPITAL_COMMUNITY): Payer: Self-pay | Admitting: Psychiatry

## 2017-07-07 ENCOUNTER — Ambulatory Visit (INDEPENDENT_AMBULATORY_CARE_PROVIDER_SITE_OTHER): Payer: 59 | Admitting: Psychiatry

## 2017-07-07 ENCOUNTER — Encounter (HOSPITAL_COMMUNITY): Payer: Self-pay | Admitting: Psychiatry

## 2017-07-07 ENCOUNTER — Ambulatory Visit: Payer: Medicare Other | Admitting: Podiatry

## 2017-07-07 DIAGNOSIS — Z818 Family history of other mental and behavioral disorders: Secondary | ICD-10-CM

## 2017-07-07 DIAGNOSIS — F332 Major depressive disorder, recurrent severe without psychotic features: Secondary | ICD-10-CM

## 2017-07-07 DIAGNOSIS — Z813 Family history of other psychoactive substance abuse and dependence: Secondary | ICD-10-CM

## 2017-07-07 DIAGNOSIS — F4310 Post-traumatic stress disorder, unspecified: Secondary | ICD-10-CM

## 2017-07-07 DIAGNOSIS — F411 Generalized anxiety disorder: Secondary | ICD-10-CM | POA: Diagnosis not present

## 2017-07-07 DIAGNOSIS — F17210 Nicotine dependence, cigarettes, uncomplicated: Secondary | ICD-10-CM | POA: Diagnosis not present

## 2017-07-07 DIAGNOSIS — Z811 Family history of alcohol abuse and dependence: Secondary | ICD-10-CM

## 2017-07-07 MED ORDER — DULOXETINE HCL 30 MG PO CPEP: ORAL_CAPSULE | ORAL | 2 refills | Status: DC
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
## 2017-07-13 ENCOUNTER — Encounter: Payer: Self-pay | Admitting: Podiatry

## 2017-07-13 ENCOUNTER — Ambulatory Visit (INDEPENDENT_AMBULATORY_CARE_PROVIDER_SITE_OTHER): Payer: Medicare Other | Admitting: Podiatry

## 2017-07-13 ENCOUNTER — Ambulatory Visit (INDEPENDENT_AMBULATORY_CARE_PROVIDER_SITE_OTHER): Payer: Medicare Other

## 2017-07-13 ENCOUNTER — Other Ambulatory Visit: Payer: Self-pay | Admitting: Podiatry

## 2017-07-13 DIAGNOSIS — M779 Enthesopathy, unspecified: Secondary | ICD-10-CM

## 2017-07-13 DIAGNOSIS — M2010 Hallux valgus (acquired), unspecified foot: Secondary | ICD-10-CM

## 2017-07-13 MED ORDER — TRIAMCINOLONE ACETONIDE 10 MG/ML IJ SUSP: 10.0000 mg | Freq: Once | INTRAMUSCULAR | Status: DC

## 2017-07-13 NOTE — Progress Notes (Signed)
Subjective:    Patient ID: [name], female   DOB: 56 y.o.   MRN: [id number]   HPI patient comes in complaining of 2 separate problems. There is inflammation around the lateral tendon complex of the left foot that's painful when pressed and also around the first MPJ left there is fluid buildup around the joint with what appears to be structural deformity    ROS      Objective:  Physical Exam neurovascular status intact with patient noted to have hyperostosis medial aspect first metatarsal head left that is prominent with fluid buildup around the joint surface and also tendinitis dorsal left with thick toenail deformity bilateral also noted     Assessment:    Inflammatory capsulitis first MPJ left with pain fluid buildup and tendinitis dorsal left foot     Plan:    H&P condition reviewed and injected around the first MPJ capsule 3 [place] Kenalog 5 mill grams Xylocaine and injected the tendon complex left 3 mg Kenalog 5 mill grams Xylocaine and discussed physical therapy anti-inflammatories wider shoes and bunion correction if symptoms do not resolve over time.  X-rays indicate structural bunion deformity left

## 2017-07-13 NOTE — Patient Instructions (Signed)

## 2017-07-15 DIAGNOSIS — H2513 Age-related nuclear cataract, bilateral: Secondary | ICD-10-CM | POA: Insufficient documentation

## 2017-07-15 DIAGNOSIS — H04123 Dry eye syndrome of bilateral lacrimal glands: Secondary | ICD-10-CM | POA: Insufficient documentation

## 2017-07-15 DIAGNOSIS — H524 Presbyopia: Secondary | ICD-10-CM | POA: Insufficient documentation

## 2017-08-23 DIAGNOSIS — K219 Gastro-esophageal reflux disease without esophagitis: Secondary | ICD-10-CM | POA: Insufficient documentation

## 2017-08-23 DIAGNOSIS — Z8679 Personal history of other diseases of the circulatory system: Secondary | ICD-10-CM | POA: Insufficient documentation

## 2017-08-25 ENCOUNTER — Ambulatory Visit (HOSPITAL_COMMUNITY): Payer: Self-pay | Admitting: Psychiatry

## 2017-09-08 ENCOUNTER — Encounter (HOSPITAL_COMMUNITY): Payer: Self-pay | Admitting: Psychiatry

## 2017-09-08 ENCOUNTER — Ambulatory Visit (INDEPENDENT_AMBULATORY_CARE_PROVIDER_SITE_OTHER): Payer: Medicare Other | Admitting: Psychiatry

## 2017-09-08 DIAGNOSIS — F17210 Nicotine dependence, cigarettes, uncomplicated: Secondary | ICD-10-CM | POA: Diagnosis not present

## 2017-09-08 DIAGNOSIS — F4310 Post-traumatic stress disorder, unspecified: Secondary | ICD-10-CM

## 2017-09-08 DIAGNOSIS — F332 Major depressive disorder, recurrent severe without psychotic features: Secondary | ICD-10-CM

## 2017-09-08 DIAGNOSIS — Z818 Family history of other mental and behavioral disorders: Secondary | ICD-10-CM | POA: Diagnosis not present

## 2017-09-08 DIAGNOSIS — F411 Generalized anxiety disorder: Secondary | ICD-10-CM | POA: Diagnosis not present

## 2017-09-08 DIAGNOSIS — Z79899 Other long term (current) drug therapy: Secondary | ICD-10-CM

## 2017-09-08 MED ORDER — DULOXETINE HCL 30 MG PO CPEP: 90.0000 mg | ORAL_CAPSULE | Freq: Every day | ORAL | 2 refills | Status: DC
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
## 2017-09-12 ENCOUNTER — Encounter: Payer: Self-pay | Admitting: Podiatry

## 2017-09-12 ENCOUNTER — Ambulatory Visit (INDEPENDENT_AMBULATORY_CARE_PROVIDER_SITE_OTHER): Payer: Medicare Other | Admitting: Podiatry

## 2017-09-12 DIAGNOSIS — M779 Enthesopathy, unspecified: Secondary | ICD-10-CM

## 2017-09-12 DIAGNOSIS — M79675 Pain in left toe(s): Secondary | ICD-10-CM

## 2017-09-12 DIAGNOSIS — M79674 Pain in right toe(s): Secondary | ICD-10-CM

## 2017-09-12 DIAGNOSIS — B351 Tinea unguium: Secondary | ICD-10-CM | POA: Diagnosis not present

## 2017-09-12 MED ORDER — TRIAMCINOLONE ACETONIDE 10 MG/ML IJ SUSP
10.0000 mg | Freq: Once | INTRAMUSCULAR | Status: AC
  Administered 2017-09-12: 10 mg

## 2017-09-12 NOTE — Progress Notes (Signed)
Subjective:    Patient ID: [name], female   DOB: 56 y.o.   MRN: [id number]   HPI patient presents with a lot of pain on top of both feet and thick yellow brittle nailbeds 1-5 both feet that are incurvated and becoming increasingly sore. 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

## 2017-09-21 DIAGNOSIS — Z86010 Personal history of colonic polyps: Secondary | ICD-10-CM | POA: Insufficient documentation

## 2017-09-23 ENCOUNTER — Other Ambulatory Visit (HOSPITAL_BASED_OUTPATIENT_CLINIC_OR_DEPARTMENT_OTHER): Payer: Self-pay | Admitting: Internal Medicine

## 2017-09-23 DIAGNOSIS — Z1231 Encounter for screening mammogram for malignant neoplasm of breast: Secondary | ICD-10-CM

## 2017-09-26 ENCOUNTER — Ambulatory Visit (HOSPITAL_BASED_OUTPATIENT_CLINIC_OR_DEPARTMENT_OTHER): Payer: Self-pay

## 2017-11-10 ENCOUNTER — Encounter (HOSPITAL_COMMUNITY): Payer: Self-pay | Admitting: Psychiatry

## 2017-11-10 ENCOUNTER — Ambulatory Visit (INDEPENDENT_AMBULATORY_CARE_PROVIDER_SITE_OTHER): Payer: Medicare Other | Admitting: Psychiatry

## 2017-11-10 ENCOUNTER — Other Ambulatory Visit: Payer: Self-pay

## 2017-11-10 DIAGNOSIS — F4310 Post-traumatic stress disorder, unspecified: Secondary | ICD-10-CM

## 2017-11-10 DIAGNOSIS — F17210 Nicotine dependence, cigarettes, uncomplicated: Secondary | ICD-10-CM | POA: Diagnosis not present

## 2017-11-10 DIAGNOSIS — F332 Major depressive disorder, recurrent severe without psychotic features: Secondary | ICD-10-CM | POA: Diagnosis not present

## 2017-11-10 DIAGNOSIS — Z811 Family history of alcohol abuse and dependence: Secondary | ICD-10-CM

## 2017-11-10 DIAGNOSIS — F411 Generalized anxiety disorder: Secondary | ICD-10-CM | POA: Diagnosis not present

## 2017-11-10 DIAGNOSIS — Z818 Family history of other mental and behavioral disorders: Secondary | ICD-10-CM

## 2017-11-10 DIAGNOSIS — Z813 Family history of other psychoactive substance abuse and dependence: Secondary | ICD-10-CM

## 2017-11-10 MED ORDER — DULOXETINE HCL 30 MG PO CPEP: 90.0000 mg | ORAL_CAPSULE | Freq: Every day | ORAL | 2 refills | Status: DC
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
## 2017-11-23 ENCOUNTER — Other Ambulatory Visit (HOSPITAL_COMMUNITY): Payer: Self-pay | Admitting: Psychiatry

## 2017-12-21 DIAGNOSIS — F17210 Nicotine dependence, cigarettes, uncomplicated: Secondary | ICD-10-CM | POA: Insufficient documentation

## 2017-12-21 DIAGNOSIS — R1313 Dysphagia, pharyngeal phase: Secondary | ICD-10-CM | POA: Insufficient documentation

## 2017-12-21 DIAGNOSIS — E041 Nontoxic single thyroid nodule: Secondary | ICD-10-CM | POA: Insufficient documentation

## 2017-12-30 ENCOUNTER — Encounter: Payer: Self-pay | Admitting: Podiatry

## 2017-12-30 ENCOUNTER — Ambulatory Visit (INDEPENDENT_AMBULATORY_CARE_PROVIDER_SITE_OTHER): Payer: Medicare Other | Admitting: Podiatry

## 2017-12-30 DIAGNOSIS — M779 Enthesopathy, unspecified: Secondary | ICD-10-CM | POA: Diagnosis not present

## 2017-12-30 DIAGNOSIS — M79675 Pain in left toe(s): Secondary | ICD-10-CM | POA: Diagnosis not present

## 2017-12-30 DIAGNOSIS — B351 Tinea unguium: Secondary | ICD-10-CM

## 2017-12-30 DIAGNOSIS — M79674 Pain in right toe(s): Secondary | ICD-10-CM | POA: Diagnosis not present

## 2017-12-30 MED ORDER — TRIAMCINOLONE ACETONIDE 10 MG/ML IJ SUSP
10.0000 mg | Freq: Once | INTRAMUSCULAR | Status: AC
  Administered 2017-12-30: 10 mg

## 2017-12-30 NOTE — Progress Notes (Signed)
Subjective:   Patient ID: [name], female   DOB: 57 y.o.   MRN: [id number]   HPI Patient presents stating her nails are really thick and sore and she cannot cut them and she has pain on top of both feet which just started recently   ROS      Objective:  Physical Exam  Neurovascular status was found to be intact with exquisite tendinitis of the dorsum of the left and right foot midfoot region and thick yellow brittle nailbeds 1-5 both feet that are painful     Assessment:  Dorsal tendinitis bilateral with mycotic nail infection with pain     Plan:  Debridement of nailbeds 1-5 both feet with no iatrogenic bleeding accomplished and injected dorsal tendon complex bilateral 3 mg Kenalog 5 mg Xylocaine with no problems noted.  Reappoint for routine care

## 2018-01-16 DIAGNOSIS — Z7984 Long term (current) use of oral hypoglycemic drugs: Secondary | ICD-10-CM | POA: Insufficient documentation

## 2018-02-16 ENCOUNTER — Ambulatory Visit (INDEPENDENT_AMBULATORY_CARE_PROVIDER_SITE_OTHER): Payer: Medicare Other | Admitting: Psychiatry

## 2018-02-16 ENCOUNTER — Encounter (HOSPITAL_COMMUNITY): Payer: Self-pay | Admitting: Psychiatry

## 2018-02-16 DIAGNOSIS — Z813 Family history of other psychoactive substance abuse and dependence: Secondary | ICD-10-CM

## 2018-02-16 DIAGNOSIS — F4310 Post-traumatic stress disorder, unspecified: Secondary | ICD-10-CM

## 2018-02-16 DIAGNOSIS — F332 Major depressive disorder, recurrent severe without psychotic features: Secondary | ICD-10-CM

## 2018-02-16 DIAGNOSIS — M542 Cervicalgia: Secondary | ICD-10-CM | POA: Diagnosis not present

## 2018-02-16 DIAGNOSIS — Z811 Family history of alcohol abuse and dependence: Secondary | ICD-10-CM | POA: Diagnosis not present

## 2018-02-16 DIAGNOSIS — M2550 Pain in unspecified joint: Secondary | ICD-10-CM

## 2018-02-16 DIAGNOSIS — Z818 Family history of other mental and behavioral disorders: Secondary | ICD-10-CM | POA: Diagnosis not present

## 2018-02-16 DIAGNOSIS — F411 Generalized anxiety disorder: Secondary | ICD-10-CM

## 2018-02-16 DIAGNOSIS — F17210 Nicotine dependence, cigarettes, uncomplicated: Secondary | ICD-10-CM

## 2018-02-16 MED ORDER — DULOXETINE HCL 30 MG PO CPEP: 90.0000 mg | ORAL_CAPSULE | Freq: Every day | ORAL | 3 refills | Status: DC
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
## 2018-02-28 DIAGNOSIS — M2012 Hallux valgus (acquired), left foot: Secondary | ICD-10-CM | POA: Insufficient documentation

## 2018-02-28 DIAGNOSIS — M19079 Primary osteoarthritis, unspecified ankle and foot: Secondary | ICD-10-CM | POA: Insufficient documentation

## 2018-03-09 DIAGNOSIS — M1710 Unilateral primary osteoarthritis, unspecified knee: Secondary | ICD-10-CM | POA: Insufficient documentation

## 2018-03-31 DIAGNOSIS — M7711 Lateral epicondylitis, right elbow: Secondary | ICD-10-CM | POA: Insufficient documentation

## 2018-04-04 ENCOUNTER — Other Ambulatory Visit (HOSPITAL_COMMUNITY): Payer: Self-pay | Admitting: Psychiatry

## 2018-04-04 DIAGNOSIS — F332 Major depressive disorder, recurrent severe without psychotic features: Secondary | ICD-10-CM

## 2018-04-04 DIAGNOSIS — F4310 Post-traumatic stress disorder, unspecified: Secondary | ICD-10-CM

## 2018-04-04 DIAGNOSIS — F411 Generalized anxiety disorder: Secondary | ICD-10-CM

## 2018-04-17 ENCOUNTER — Encounter: Payer: Self-pay | Admitting: Podiatry

## 2018-04-17 ENCOUNTER — Other Ambulatory Visit: Payer: Self-pay | Admitting: Podiatry

## 2018-04-17 ENCOUNTER — Ambulatory Visit (INDEPENDENT_AMBULATORY_CARE_PROVIDER_SITE_OTHER): Payer: Medicare Other

## 2018-04-17 ENCOUNTER — Ambulatory Visit (INDEPENDENT_AMBULATORY_CARE_PROVIDER_SITE_OTHER): Payer: Medicare Other | Admitting: Podiatry

## 2018-04-17 DIAGNOSIS — M2011 Hallux valgus (acquired), right foot: Secondary | ICD-10-CM

## 2018-04-17 DIAGNOSIS — M2012 Hallux valgus (acquired), left foot: Principal | ICD-10-CM

## 2018-04-17 DIAGNOSIS — M779 Enthesopathy, unspecified: Secondary | ICD-10-CM

## 2018-04-17 DIAGNOSIS — M21611 Bunion of right foot: Secondary | ICD-10-CM | POA: Diagnosis not present

## 2018-04-17 DIAGNOSIS — M2010 Hallux valgus (acquired), unspecified foot: Secondary | ICD-10-CM

## 2018-04-17 DIAGNOSIS — M21612 Bunion of left foot: Secondary | ICD-10-CM

## 2018-04-17 DIAGNOSIS — M7750 Other enthesopathy of unspecified foot: Secondary | ICD-10-CM | POA: Diagnosis not present

## 2018-04-17 MED ORDER — TRIAMCINOLONE ACETONIDE 10 MG/ML IJ SUSP
10.0000 mg | Freq: Once | INTRAMUSCULAR | Status: AC
  Administered 2018-04-17: 10 mg

## 2018-04-17 NOTE — Patient Instructions (Signed)
Pre-Operative Instructions  Congratulations, you have decided to take an important step towards improving your quality of life.  You can be assured that the doctors and staff at [facility] will be with you every step of the way.  Here are some important things you should know:  1. Plan to be at the surgery center/hospital at least 1 (one) hour prior to your scheduled time, unless otherwise directed by the surgical center/hospital staff.  You must have a responsible adult accompany you, remain during the surgery and drive you home.  Make sure you have directions to the surgical center/hospital to ensure you arrive on time. 2. If you are having surgery at [facility] or [facility] hospitals, you will need a copy of your medical history and physical form from your family physician within one month prior to the date of surgery. We will give you a form for your primary physician to complete.  3. We make every effort to accommodate the date you request for surgery.  However, there are times where surgery dates or times have to be moved.  We will contact you as soon as possible if a change in schedule is required.   4. No aspirin/ibuprofen for one week before surgery.  If you are on aspirin, any non-steroidal anti-inflammatory medications (Mobic, Aleve, Ibuprofen) should not be taken seven (7) days prior to your surgery.  You make take Tylenol for pain prior to surgery.  5. Medications - If you are taking daily heart and blood pressure medications, seizure, reflux, allergy, asthma, anxiety, pain or diabetes medications, make sure you notify the surgery center/hospital before the day of surgery so they can tell you which medications you should take or avoid the day of surgery. 6. No food or drink after midnight the night before surgery unless directed otherwise by surgical center/hospital staff. 7. No alcoholic beverages 24-hours prior to surgery.  No smoking 24-hours prior or 24-hours after  surgery. 8. Wear loose pants or shorts. They should be loose enough to fit over bandages, boots, and casts. 9. Don't wear slip-on shoes. Sneakers are preferred. 10. Bring your boot with you to the surgery center/hospital.  Also bring crutches or a walker if your physician has prescribed it for you.  If you do not have this equipment, it will be provided for you after surgery. 11. If you have not been contacted by the surgery center/hospital by the day before your surgery, call to confirm the date and time of your surgery. 12. Leave-time from work may vary depending on the type of surgery you have.  Appropriate arrangements should be made prior to surgery with your employer. 13. Prescriptions will be provided immediately following surgery by your doctor.  Fill these as soon as possible after surgery and take the medication as directed. Pain medications will not be refilled on weekends and must be approved by the doctor. 14. Remove nail polish on the operative foot and avoid getting pedicures prior to surgery. 15. Wash the night before surgery.  The night before surgery wash the foot and leg well with water and the antibacterial soap provided. Be sure to pay special attention to beneath the toenails and in between the toes.  Wash for at least three (3) minutes. Rinse thoroughly with water and dry well with a towel.  Perform this wash unless told not to do so by your physician.  Enclosed: 1 Ice pack (please put in freezer the night before surgery)   1 Hibiclens skin cleaner     Pre-op instructions  If you have any questions regarding the instructions, please do not hesitate to call our office.  [place]: [address], [place], [place] [address] -- [phone]  [place]: [address] , [place] [address] -- [phone]  [place]: [address]  [place], [place] [address] -- [phone]  [place]: [address], [address], [place], [place] [address] -- [phone]  Website:  [email or web address] 

## 2018-04-19 NOTE — Progress Notes (Signed)
Subjective:   Patient ID: [name], female   DOB: 57 y.o.   MRN: [id number]   HPI Patient presents stating that times her bunions seem to hurt her but is really the pain on top of her feet that bothers her the most and it is been several months since we worked on this   ROS      Objective:  Physical Exam  Neurovascular status unchanged with patient having mild bunion deformity right her true symptoms are really more related to the top of her feet with inflammation fluid around the dorsal tendon complex     Assessment:  Mild structural HAV deformity right which is present but not currently significantly symptomatic along with dorsal tendinitis     Plan:  H&P conditions reviewed and will get a focus on the top and I did reinject the dorsal tendon complex a more lateral today with 3 mg Kenalog 5 mg Xylocaine.  I do not recommend removal of the bunion unless it were to become more symptomatic and she does have mild systemic risk and I would rather not do this unless it becomes so painful she cannot wear shoe gear

## 2018-05-05 ENCOUNTER — Other Ambulatory Visit (HOSPITAL_COMMUNITY): Payer: Self-pay | Admitting: Psychiatry

## 2018-05-05 DIAGNOSIS — F332 Major depressive disorder, recurrent severe without psychotic features: Secondary | ICD-10-CM

## 2018-05-05 DIAGNOSIS — F4310 Post-traumatic stress disorder, unspecified: Secondary | ICD-10-CM

## 2018-05-05 DIAGNOSIS — F411 Generalized anxiety disorder: Secondary | ICD-10-CM

## 2018-05-19 ENCOUNTER — Other Ambulatory Visit (HOSPITAL_COMMUNITY): Payer: Self-pay | Admitting: Psychiatry

## 2018-05-19 ENCOUNTER — Other Ambulatory Visit: Payer: Self-pay

## 2018-05-19 DIAGNOSIS — F4310 Post-traumatic stress disorder, unspecified: Secondary | ICD-10-CM

## 2018-05-19 DIAGNOSIS — F411 Generalized anxiety disorder: Secondary | ICD-10-CM

## 2018-05-19 DIAGNOSIS — F332 Major depressive disorder, recurrent severe without psychotic features: Secondary | ICD-10-CM

## 2018-05-22 ENCOUNTER — Ambulatory Visit (INDEPENDENT_AMBULATORY_CARE_PROVIDER_SITE_OTHER): Payer: Medicare HMO | Admitting: Podiatry

## 2018-05-22 ENCOUNTER — Encounter: Payer: Self-pay | Admitting: Podiatry

## 2018-05-22 VITALS — BP 144/86 | HR 83 | Resp 16

## 2018-05-22 DIAGNOSIS — M21612 Bunion of left foot: Secondary | ICD-10-CM

## 2018-05-22 DIAGNOSIS — M2041 Other hammer toe(s) (acquired), right foot: Secondary | ICD-10-CM

## 2018-05-22 DIAGNOSIS — M21611 Bunion of right foot: Secondary | ICD-10-CM | POA: Diagnosis not present

## 2018-05-22 DIAGNOSIS — M2042 Other hammer toe(s) (acquired), left foot: Secondary | ICD-10-CM

## 2018-05-22 DIAGNOSIS — M898X9 Other specified disorders of bone, unspecified site: Secondary | ICD-10-CM

## 2018-05-22 NOTE — Patient Instructions (Signed)
Pre-Operative Instructions  Congratulations, you have decided to take an important step towards improving your quality of life.  You can be assured that the doctors and staff at [facility] will be with you every step of the way.  Here are some important things you should know:  1. Plan to be at the surgery center/hospital at least 1 (one) hour prior to your scheduled time, unless otherwise directed by the surgical center/hospital staff.  You must have a responsible adult accompany you, remain during the surgery and drive you home.  Make sure you have directions to the surgical center/hospital to ensure you arrive on time. 2. If you are having surgery at [facility] or [facility] hospitals, you will need a copy of your medical history and physical form from your family physician within one month prior to the date of surgery. We will give you a form for your primary physician to complete.  3. We make every effort to accommodate the date you request for surgery.  However, there are times where surgery dates or times have to be moved.  We will contact you as soon as possible if a change in schedule is required.   4. No aspirin/ibuprofen for one week before surgery.  If you are on aspirin, any non-steroidal anti-inflammatory medications (Mobic, Aleve, Ibuprofen) should not be taken seven (7) days prior to your surgery.  You make take Tylenol for pain prior to surgery.  5. Medications - If you are taking daily heart and blood pressure medications, seizure, reflux, allergy, asthma, anxiety, pain or diabetes medications, make sure you notify the surgery center/hospital before the day of surgery so they can tell you which medications you should take or avoid the day of surgery. 6. No food or drink after midnight the night before surgery unless directed otherwise by surgical center/hospital staff. 7. No alcoholic beverages 24-hours prior to surgery.  No smoking 24-hours prior or 24-hours after  surgery. 8. Wear loose pants or shorts. They should be loose enough to fit over bandages, boots, and casts. 9. Don't wear slip-on shoes. Sneakers are preferred. 10. Bring your boot with you to the surgery center/hospital.  Also bring crutches or a walker if your physician has prescribed it for you.  If you do not have this equipment, it will be provided for you after surgery. 11. If you have not been contacted by the surgery center/hospital by the day before your surgery, call to confirm the date and time of your surgery. 12. Leave-time from work may vary depending on the type of surgery you have.  Appropriate arrangements should be made prior to surgery with your employer. 13. Prescriptions will be provided immediately following surgery by your doctor.  Fill these as soon as possible after surgery and take the medication as directed. Pain medications will not be refilled on weekends and must be approved by the doctor. 14. Remove nail polish on the operative foot and avoid getting pedicures prior to surgery. 15. Wash the night before surgery.  The night before surgery wash the foot and leg well with water and the antibacterial soap provided. Be sure to pay special attention to beneath the toenails and in between the toes.  Wash for at least three (3) minutes. Rinse thoroughly with water and dry well with a towel.  Perform this wash unless told not to do so by your physician.  Enclosed: 1 Ice pack (please put in freezer the night before surgery)   1 Hibiclens skin cleaner     Pre-op instructions  If you have any questions regarding the instructions, please do not hesitate to call our office.  [place]: [address], [place], [place] [address] -- [phone]  [place]: [address] [place], [place] [address] -- [phone]  [place]: [address]  [place], [place] [address] -- [phone]  [place]: [address], [place], [place] [address] -- [phone]  Website:  [email or web address] 

## 2018-05-24 ENCOUNTER — Encounter: Payer: Self-pay | Admitting: *Deleted

## 2018-05-24 ENCOUNTER — Telehealth: Payer: Self-pay | Admitting: *Deleted

## 2018-05-24 NOTE — Telephone Encounter (Signed)
I attempted to call the patient twice at her home number.  A child answered each time and hung up on me each time.  I tried her mobile number but I could not leave a message because the mailbox was full.  I then tried the Home Phone number and it was not in service.  I am trying to find out who her primary care physician is so I can send him (her) a request for medical clearance for surgery.

## 2018-05-26 NOTE — Telephone Encounter (Addendum)
I attempted to call the patient again to find out her her primary care physician is.  She is scheduled for surgery on August 8.  Dr. Amalia Hailey has requested medical clearance.

## 2018-05-28 NOTE — Progress Notes (Signed)
Subjective: 57 year old female presenting today with a chief complaint of aching to the dorsal aspect of the bilateral midfoot, left worse than right, that has been ongoing for the past 8 years. She reports associated swelling. Walking and wearing certain shoes increases the pain. She has been applying Voltaren gel she received from Dr. Gershon Mussel. He referred her here for spurs. Patient is here for further evaluation and treatment.   Past Medical History:  Diagnosis Date  . Arthritis   . Cervicalgia   . Chondromalacia of patella   . Chronic neck pain   . Chronic pain syndrome   . Depression    secondary to loss of her son at age 40  . Diabetes mellitus   . Diabetic neuropathy ([place])   . DMII (diabetes mellitus, type 2) ([place])   . Enthesopathy of hip region   . Hep C w/o coma, chronic ([place])   . Hepatitis C    diagnosed 2005  . Hypertension   . Insomnia   . Insomnia   . Low back pain   . Lumbago   . Obesity   . Pain in joint, upper arm   . Primary localized osteoarthrosis, lower leg   . 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 Tobacco abuse   . 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  Dermatology: Skin is cool, dry and supple bilateral lower extremities. Negative for open lesions or macerations.  Vascular: Palpable pedal pulses bilaterally. No edema or erythema noted. Capillary refill within normal limits.  Neurological: Epicritic and protective threshold grossly intact bilaterally.   Musculoskeletal Exam: Clinical evidence of bunion deformity noted to the respective foot. There is a moderate pain on palpation range of motion of the first MPJ. Lateral deviation of the hallux noted consistent with hallux abductovalgus. Hammertoe contracture also noted on clinical exam to digits 2-5 of the bilateral feet. Symptomatic pain on palpation and range of motion also noted to the  metatarsal phalangeal joints of the respective hammertoe digits.   Dorsal midfoot exostosis noted bilaterally with pain on palpation.   Radiographic Exam: Increased intermetatarsal angle greater than 15 with a hallux abductus angle greater than 30 noted on AP view. Moderate degenerative changes noted within the first MPJ. Contracture deformity also noted to the interphalangeal joints and MPJs of the digits of the respective hammertoes.    Assessment: 1. HAV w/ bunion deformity bilateral 2. Hammertoe deformity digits 2-5 bilateral   3. Dorsal midfoot exostosis bilateral    Plan of Care:  1. Patient was evaluated. X-Rays reviewed. 2. Today we discussed the conservative versus surgical management of the presenting pathology. The patient opts for surgical management. All possible complications and details of the procedure were explained. All patient questions were answered. No guarantees were expressed or implied. 3. Authorization for surgery was initiated today. Surgery will consist of bunionectomy with double osteotomy left, PIPJ arthroplasty with MPJ capsulotomy 2-5 left, dorsal midfoot exostectomy left. 4. Request for home health aid placed for post op care.  5. CAM boot dispensed.  6. Patient will require medical clearance from PCP prior to surgery.  7. Return to clinic one week post op.   Edrick Kins, DPM [facility]  Dr. Edrick Kins, DPM    [address]  [place], [place] [address]                Office [phone]  Fax [phone]

## 2018-05-31 NOTE — Telephone Encounter (Addendum)
I attempted to call the patient once again.  Patient did not answer [name] home phone.  I was not given the option to leave a message.  I called [name] mobile number but was unable to leave a message because [name] mailbox was full.  I do not know who [name] primary care physician is.  I cannot get medical clearance prior to scheduled surgery on 07/13/2018 without this information.

## 2018-06-19 ENCOUNTER — Other Ambulatory Visit (HOSPITAL_COMMUNITY): Payer: Self-pay | Admitting: Psychiatry

## 2018-06-19 DIAGNOSIS — F4310 Post-traumatic stress disorder, unspecified: Secondary | ICD-10-CM

## 2018-06-19 DIAGNOSIS — F411 Generalized anxiety disorder: Secondary | ICD-10-CM

## 2018-06-19 DIAGNOSIS — F332 Major depressive disorder, recurrent severe without psychotic features: Secondary | ICD-10-CM

## 2018-06-22 ENCOUNTER — Other Ambulatory Visit (HOSPITAL_COMMUNITY): Payer: Self-pay | Admitting: Psychiatry

## 2018-06-22 ENCOUNTER — Ambulatory Visit (INDEPENDENT_AMBULATORY_CARE_PROVIDER_SITE_OTHER): Payer: Medicare HMO | Admitting: Psychiatry

## 2018-06-22 ENCOUNTER — Encounter (HOSPITAL_COMMUNITY): Payer: Self-pay | Admitting: Psychiatry

## 2018-06-22 VITALS — BP 148/74 | HR 93 | Ht 71.0 in | Wt 306.0 lb

## 2018-06-22 DIAGNOSIS — F411 Generalized anxiety disorder: Secondary | ICD-10-CM | POA: Diagnosis not present

## 2018-06-22 DIAGNOSIS — G4700 Insomnia, unspecified: Secondary | ICD-10-CM

## 2018-06-22 DIAGNOSIS — F332 Major depressive disorder, recurrent severe without psychotic features: Secondary | ICD-10-CM | POA: Diagnosis not present

## 2018-06-22 DIAGNOSIS — F4310 Post-traumatic stress disorder, unspecified: Secondary | ICD-10-CM

## 2018-06-22 MED ORDER — DULOXETINE HCL 60 MG PO CPEP: 120.0000 mg | ORAL_CAPSULE | Freq: Every day | ORAL | 3 refills | Status: DC

## 2018-06-22 MED ORDER — BUPROPION HCL ER (SR) 200 MG PO TB12: 200.0000 mg | ORAL_TABLET | Freq: Every day | ORAL | 3 refills | Status: DC

## 2018-06-22 MED ORDER — TRAZODONE HCL 100 MG PO TABS: 200.0000 mg | ORAL_TABLET | Freq: Every day | ORAL | 3 refills | Status: DC
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
## 2018-07-05 NOTE — Telephone Encounter (Signed)
I have made several attempts to reach the patient.  We have not obtained medical clearance because I do not know who her primary care physician is.  I am going to cancel her surgery for 07/13/2018.  I c

## 2018-07-06 ENCOUNTER — Telehealth: Payer: Self-pay | Admitting: *Deleted

## 2018-07-06 NOTE — Telephone Encounter (Signed)
I left the patient a message that we were canceling the surgery for 07/13/2018.  I have made several attempts to call and get the name of her primary care doctor, so I can get medical clearance that Dr. Amalia Hailey has requested.

## 2018-07-11 ENCOUNTER — Encounter: Payer: Self-pay | Admitting: *Deleted

## 2018-07-11 ENCOUNTER — Telehealth: Payer: Self-pay | Admitting: Podiatry

## 2018-07-11 NOTE — Telephone Encounter (Signed)
I spoke to pt and informed pt, [name] - Surgery Coordinator had made multiple attempts to contact pt for PCP to obtain medical clearance for surgery. I told pt I would send the note to her PCP for the clearance and would attempt to get the clearance, so she could be scheduled possibly next week. Pt states she seen DR. Sofie Hartigan at [facility] in [place], and she would like him to get medical clearance for stronger pain medication also.

## 2018-07-11 NOTE — Telephone Encounter (Signed)
Just general medical clearance. Patient has history of tobacco use, and multiple comorbidities regarding sugery. Dr. Amalia Hailey

## 2018-07-11 NOTE — Telephone Encounter (Signed)
Patient had surgery scheduled for 07/13/18 with Dr Amalia Hailey. She was called and told it was canceled and she would like to know why and if she can get it rescheduled for the same day. Please call patient back at [phone]. Call after 2pm if its not before 11am. She has a doctors apt at 11am.

## 2018-07-11 NOTE — Telephone Encounter (Signed)
Faxed letter to Dr. Arvella Nigh for medical clearance for diabetic and hypertensive diagnosis and recommendations for pain management.

## 2018-07-11 NOTE — Telephone Encounter (Signed)
Pt called and states she is waiting for a call concerning rescheduling her surgery for next week. I spoke with pt and told her that I had spoken with her this morning and we had discussed getting medical clearance from Dr. Arvella Nigh, and once we had gotten that the surgery coordinator would call her and help her schedule hopefully next week if received in time and surgery time was available. Pt states she remembers now her feet just feels so bad.

## 2018-07-19 ENCOUNTER — Encounter: Payer: Medicare HMO | Admitting: Podiatry

## 2018-07-19 ENCOUNTER — Telehealth: Payer: Self-pay | Admitting: *Deleted

## 2018-07-19 NOTE — Telephone Encounter (Signed)
"  I am calling to see if you received the forms from my doctor approving me to have surgery.  My foot is killing me.  I need to have this done as soon as possible."  We have not received anything from Dr. Jonita Albee sent the letter to your doctor on 07/11/2018.  "I am here to see the doctor today.  Can you send the forms again?"  A form was not faxed.  All we need is a written statement, it can we included in the clinical notes from your visit today.  "What is your fax number?"  My fax number is [phone].

## 2018-07-21 NOTE — Telephone Encounter (Signed)
I left [name] a message that we still haven't received medical clearance from her primary care physician.  I asked her to get them to fax it to [place] at [phone].

## 2018-07-24 NOTE — Telephone Encounter (Addendum)
"  I am calling to see if you got the medical clearance from Dr. Laurin Coder."  I have not received it.  "Did you fax the form to him?"  I have not you said you were at his office and that you would get him to send it.  What is his fax number?  The fax number is [phone]."  I'll send it.  I will let you know when I get a response.

## 2018-07-25 NOTE — Telephone Encounter (Signed)
I faxed the medical clearance request to Dr. Arvella Nigh again.  It was faxed previously to him/her on 07/11/2018.  We have not received a response.

## 2018-07-25 NOTE — Telephone Encounter (Signed)
"  I was calling to see if you received that information back from my doctor's office.  If I have to, I'll make an appointment just to pick it up."

## 2018-07-26 ENCOUNTER — Encounter: Payer: Medicare HMO | Admitting: Podiatry

## 2018-08-02 ENCOUNTER — Ambulatory Visit (INDEPENDENT_AMBULATORY_CARE_PROVIDER_SITE_OTHER): Payer: Medicare HMO | Admitting: Podiatry

## 2018-08-02 ENCOUNTER — Encounter: Payer: Self-pay | Admitting: Podiatry

## 2018-08-02 DIAGNOSIS — M21621 Bunionette of right foot: Secondary | ICD-10-CM

## 2018-08-02 DIAGNOSIS — M898X9 Other specified disorders of bone, unspecified site: Secondary | ICD-10-CM

## 2018-08-02 DIAGNOSIS — M21611 Bunion of right foot: Secondary | ICD-10-CM

## 2018-08-02 DIAGNOSIS — M2041 Other hammer toe(s) (acquired), right foot: Secondary | ICD-10-CM

## 2018-08-02 DIAGNOSIS — M21612 Bunion of left foot: Secondary | ICD-10-CM

## 2018-08-02 DIAGNOSIS — M21622 Bunionette of left foot: Secondary | ICD-10-CM

## 2018-08-02 DIAGNOSIS — M2042 Other hammer toe(s) (acquired), left foot: Secondary | ICD-10-CM

## 2018-08-02 NOTE — Patient Instructions (Signed)
Pre-Operative Instructions  Congratulations, you have decided to take an important step towards improving your quality of life.  You can be assured that the doctors and staff at [facility] will be with you every step of the way.  Here are some important things you should know:  1. Plan to be at the surgery center/hospital at least 1 (one) hour prior to your scheduled time, unless otherwise directed by the surgical center/hospital staff.  You must have a responsible adult accompany you, remain during the surgery and drive you home.  Make sure you have directions to the surgical center/hospital to ensure you arrive on time. 2. If you are having surgery at [facility] or [facility], you will need a copy of your medical history and physical form from your family physician within one month prior to the date of surgery. We will give you a form for your primary physician to complete.  3. We make every effort to accommodate the date you request for surgery.  However, there are times where surgery dates or times have to be moved.  We will contact you as soon as possible if a change in schedule is required.   4. No aspirin/ibuprofen for one week before surgery.  If you are on aspirin, any non-steroidal anti-inflammatory medications (Mobic, Aleve, Ibuprofen) should not be taken seven (7) days prior to your surgery.  You make take Tylenol for pain prior to surgery.  5. Medications - If you are taking daily heart and blood pressure medications, seizure, reflux, allergy, asthma, anxiety, pain or diabetes medications, make sure you notify the surgery center/hospital before the day of surgery so they can tell you which medications you should take or avoid the day of surgery. 6. No food or drink after midnight the night before surgery unless directed otherwise by surgical center/hospital staff. 7. No alcoholic beverages 24-hours prior to surgery.  No smoking 24-hours prior or 24-hours after  surgery. 8. Wear loose pants or shorts. They should be loose enough to fit over bandages, boots, and casts. 9. Don't wear slip-on shoes. Sneakers are preferred. 10. Bring your boot with you to the surgery center/hospital.  Also bring crutches or a walker if your physician has prescribed it for you.  If you do not have this equipment, it will be provided for you after surgery. 11. If you have not been contacted by the surgery center/hospital by the day before your surgery, call to confirm the date and time of your surgery. 12. Leave-time from work may vary depending on the type of surgery you have.  Appropriate arrangements should be made prior to surgery with your employer. 13. Prescriptions will be provided immediately following surgery by your doctor.  Fill these as soon as possible after surgery and take the medication as directed. Pain medications will not be refilled on weekends and must be approved by the doctor. 14. Remove nail polish on the operative foot and avoid getting pedicures prior to surgery. 15. Wash the night before surgery.  The night before surgery wash the foot and leg well with water and the antibacterial soap provided. Be sure to pay special attention to beneath the toenails and in between the toes.  Wash for at least three (3) minutes. Rinse thoroughly with water and dry well with a towel.  Perform this wash unless told not to do so by your physician.  Enclosed: 1 Ice pack (please put in freezer the night before surgery)   1 Hibiclens skin cleaner     Pre-op instructions  If you have any questions regarding the instructions, please do not hesitate to call our office.  [place]: [address], [place], [place] [address] -- [phone]  [place]: [address] [place], [place] [address] -- [phone]  [place]: [address]  [place], [place] [address] -- [phone]  [place]: [address], [address], [place],  [address] -- [phone]  Website:  [email or web address] 

## 2018-08-02 NOTE — Progress Notes (Signed)
[name] came by the office to get a copy of the medical clearance letter that we sent to Dr. Laurin Coder.  She said she was going to deliver it to Dr. Olene Craven office personally to get them to respond to our request.

## 2018-08-05 NOTE — Progress Notes (Signed)
Subjective: 57 year old female presenting today for follow up evaluation of bilateral bunions, hammertoes and left midfoot pain. She is interested in surgical intervention at this time. Patient is here for further evaluation and treatment.   Past Medical History:  Diagnosis Date  . Arthritis   . Cervicalgia   . Chondromalacia of patella   . Chronic neck pain   . Chronic pain syndrome   . Depression    secondary to loss of her son at age 44  . Diabetes mellitus   . Diabetic neuropathy ([place])   . DMII (diabetes mellitus, type 2) ([place])   . Enthesopathy of hip region   . Hep C w/o coma, chronic ([place])   . Hepatitis C    diagnosed 2005  . Hypertension   . Insomnia   . Insomnia   . Low back pain   . Lumbago   . Obesity   . Pain in joint, upper arm   . Primary localized osteoarthrosis, lower leg   . 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 Tobacco abuse   . 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  Dermatology: Skin is cool, dry and supple bilateral lower extremities. Negative for open lesions or macerations.  Vascular: Palpable pedal pulses bilaterally. No edema or erythema noted. Capillary refill within normal limits.  Neurological: Epicritic and protective threshold grossly intact bilaterally.   Musculoskeletal Exam: Clinical evidence of bunion deformity noted to the respective foot. There is a moderate pain on palpation range of motion of the first MPJ. Lateral deviation of the hallux noted consistent with hallux abductovalgus. Hammertoe contracture also noted on clinical exam to digits 2-5 of the bilateral feet. Symptomatic pain on palpation and range of motion also noted to the metatarsal phalangeal joints of the respective hammertoe digits.   Dorsal midfoot exostosis noted bilaterally with pain on palpation. Clinical evidence of Tailor's bunion deformity noted to  the respective foot. There is a moderate pain on palpation range of motion of the fifth MPJ.    Assessment: 1. HAV w/ bunion deformity bilateral 2. Hammertoe deformity digits 2-5 bilateral   3. Dorsal midfoot exostosis bilateral  4. Tailor's bunion deformity bilateral    Plan of Care:  1. Patient was evaluated.  2. Today we discussed the conservative versus surgical management of the presenting pathology. The patient opts for surgical management. All possible complications and details of the procedure were explained. All patient questions were answered. No guarantees were expressed or implied. 3. Authorization for surgery was initiated today. Surgery will consist of bunionectomy with double osteotomy left, PIPJ arthroplasty with MPJ capsulotomy 2-5 left, dorsal midfoot exostectomy left, Tailor's bunionectomy with metatarsal osteotomy left.  4. Request for home health aid placed for post op care.  5. Patient will require medical clearance from PCP prior to surgery.  6. Return to clinic one week post op.   Edrick Kins, DPM [facility]  Dr. Edrick Kins, DPM    [address]                                        [place],  [address]                Office [phone]  Fax [phone]  [phone]       

## 2018-08-11 ENCOUNTER — Telehealth: Payer: Self-pay | Admitting: *Deleted

## 2018-08-11 NOTE — Telephone Encounter (Signed)
We received the medical clearance letter from Dr. Laurin Coder.  I called and left the patient a message that she was cleared by Dr. Laurin Coder.  We can proceed with the procedure.

## 2018-08-15 NOTE — Telephone Encounter (Signed)
"  I am calling to see if you all received medical clearance from the patient's doctor."  Yes, we did.  "Can you fax it to [place].  The patient has called inquiring about it.  She said you all told her to call here to find out."  I have not received any calls from the patient.  I will send it over.  I emailed the medical clearance letter to Caren Griffins at the surgical center.

## 2018-08-15 NOTE — Telephone Encounter (Signed)
I faxed clinicals to [facility] as requested for review for authorization of [name]'s surgery.

## 2018-08-16 NOTE — Telephone Encounter (Addendum)
I called to check on the status of the authorization request.  Brandy informed me it was still pending.  The reference number for the call is [id number].

## 2018-08-18 NOTE — Telephone Encounter (Signed)
"  I'm calling to get the date of birth of the patient.  Our dates are not matching."  It's Mar 27, 1961.  "That's not what we have.  Maybe our records are wrong.  Can you send [place] a copy of a document stating that?"  I'm sorry, someone had written it down incorrectly.  It is 1961/01/09.  "Okay thank you, that's what we have.  I also wanted to let you know we do not handle code [phone], you need to contact [facility] regarding it."  So, have the other codes been authorized?  "Not yet, the case is under review as we speak."

## 2018-08-22 NOTE — Telephone Encounter (Signed)
I called and spoke to Zel J to see if authorization was needed for cpt code 28270.  He stated pre-certification was not needed for this code.  The reference number for the call is [id number].    I called [facility] to check on the status of the authorization.  Mammie Lorenzo stated it was still pending.  She asked if I would like for her to put a rush on it.  I told her yes.  The reference number for the call is [id number] [phone].

## 2018-08-24 ENCOUNTER — Other Ambulatory Visit: Payer: Self-pay | Admitting: Podiatry

## 2018-08-24 ENCOUNTER — Encounter: Payer: Self-pay | Admitting: Podiatry

## 2018-08-24 ENCOUNTER — Telehealth: Payer: Self-pay | Admitting: Podiatry

## 2018-08-24 DIAGNOSIS — M2012 Hallux valgus (acquired), left foot: Secondary | ICD-10-CM | POA: Diagnosis not present

## 2018-08-24 DIAGNOSIS — M7752 Other enthesopathy of left foot: Secondary | ICD-10-CM | POA: Diagnosis not present

## 2018-08-24 DIAGNOSIS — M25775 Osteophyte, left foot: Secondary | ICD-10-CM | POA: Diagnosis not present

## 2018-08-24 DIAGNOSIS — M2042 Other hammer toe(s) (acquired), left foot: Secondary | ICD-10-CM | POA: Diagnosis not present

## 2018-08-24 DIAGNOSIS — M21542 Acquired clubfoot, left foot: Secondary | ICD-10-CM | POA: Diagnosis not present

## 2018-08-24 MED ORDER — CEPHALEXIN 500 MG PO CAPS: 500.0000 mg | ORAL_CAPSULE | Freq: Three times a day (TID) | ORAL | 0 refills | Status: DC

## 2018-08-24 MED ORDER — OXYCODONE-ACETAMINOPHEN 5-325 MG PO TABS: 1.0000 | ORAL_TABLET | ORAL | 0 refills | Status: DC | PRN

## 2018-08-24 NOTE — Telephone Encounter (Signed)
The surgery was authorized on 08/22/2018.

## 2018-08-24 NOTE — Telephone Encounter (Signed)
I informed Julianna - [facility] Dr. Amalia Hailey stated destroy the percocet rx he had written.

## 2018-08-24 NOTE — Progress Notes (Signed)
Postop  Percocet 5/325mg  q4h Keflex 500mg  TID #[id number]

## 2018-08-24 NOTE — Telephone Encounter (Signed)
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 I'm calling about the Rx we got today for  percocet 5-325 that was sent in today from Dr. Amalia Hailey. The pt is actually already a pain management pt and is getting oxycodone 10 mg regularly and just increased to 15 mg. The most recent refill was on 03 September. I'm just inquiring about the needs for the percocet in addition to the oxycodone. Give me a call back and let me know if this is necessary, and was the provider aware when he was prescribing, and if the pt has been counseled on how to take it and is she going to stop taking the oxycodone, and if you have talked to the pain management provider as well. The call back number is [phone]. Thank you.

## 2018-08-26 ENCOUNTER — Other Ambulatory Visit: Payer: Self-pay | Admitting: Sports Medicine

## 2018-08-26 DIAGNOSIS — M79673 Pain in unspecified foot: Secondary | ICD-10-CM

## 2018-08-26 MED ORDER — CYCLOBENZAPRINE HCL 5 MG PO TABS: 5.0000 mg | ORAL_TABLET | Freq: Every day | ORAL | 0 refills | Status: DC

## 2018-08-26 MED ORDER — IBUPROFEN 800 MG PO TABS: 800.0000 mg | ORAL_TABLET | Freq: Three times a day (TID) | ORAL | 0 refills | Status: DC | PRN

## 2018-08-26 NOTE — Progress Notes (Signed)
Patient called stating that her foot is sore especially with walking. I advised patient to refrain from excessively walking or standing after having surgery. I sent to pharmacy motrin 800mg  to take in between doses of pain medication for break through and flexiril to take as needed but do not mix with pain medication. -Dr. Cannon Kettle

## 2018-08-30 ENCOUNTER — Ambulatory Visit (INDEPENDENT_AMBULATORY_CARE_PROVIDER_SITE_OTHER): Payer: Medicare HMO | Admitting: Podiatry

## 2018-08-30 ENCOUNTER — Other Ambulatory Visit: Payer: Self-pay | Admitting: Podiatry

## 2018-08-30 ENCOUNTER — Ambulatory Visit (INDEPENDENT_AMBULATORY_CARE_PROVIDER_SITE_OTHER): Payer: Medicare HMO

## 2018-08-30 DIAGNOSIS — M2042 Other hammer toe(s) (acquired), left foot: Secondary | ICD-10-CM

## 2018-08-30 DIAGNOSIS — M21612 Bunion of left foot: Secondary | ICD-10-CM

## 2018-08-30 DIAGNOSIS — M21611 Bunion of right foot: Secondary | ICD-10-CM

## 2018-08-30 DIAGNOSIS — M2041 Other hammer toe(s) (acquired), right foot: Secondary | ICD-10-CM

## 2018-08-30 DIAGNOSIS — Z9889 Other specified postprocedural states: Secondary | ICD-10-CM

## 2018-08-30 MED ORDER — CEPHALEXIN 500 MG PO CAPS: 500.0000 mg | ORAL_CAPSULE | Freq: Three times a day (TID) | ORAL | 0 refills | Status: DC

## 2018-08-30 NOTE — Progress Notes (Signed)
   Subjective:  Patient presents today status post left forefoot reconstructive surgery. DOS: 08/23/2018.  Patient presents today wearing her immobilization cam boot.  There is a strong malodor noted.  Immobilization cam boot is wet although the patient denies getting the boot wet.  She is currently being managed by pain management and is on 15 mg oxycodone every 6 hours for chronic pain unrelated to the foot.  Past Medical History:  Diagnosis Date  . Arthritis   . Cervicalgia   . Chondromalacia of patella   . Chronic neck pain   . Chronic pain syndrome   . Depression    secondary to loss of her son at age 66  . Diabetes mellitus   . Diabetic neuropathy ([place])   . DMII (diabetes mellitus, type 2) ([place])   . Enthesopathy of hip region   . Hep C w/o coma, chronic ([place])   . Hepatitis C    diagnosed 2005  . Hypertension   . Insomnia   . Insomnia   . Low back pain   . Lumbago   . Obesity   . Pain in joint, upper arm   . Primary localized osteoarthrosis, lower leg   . 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 Tobacco abuse   . 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  Skin incisions appear to be well coapted with sutures and staples intact. No dehiscence. No active bleeding noted.  Strong malodor noted with a heavy amount of maceration noted to the skin throughout the foot extending along the plantar aspect of the foot to the heel.  Malodor is likely due to skin maceration rather than infection.  Radiographic Exam:  Orthopedic hardware and osteotomies sites appear to be stable with routine healing.  Assessment: 1. s/p left forefoot reconstructive surgery. DOS: 08/23/2018 2.  Heavy maceration left foot   Plan of Care:  1. Patient was evaluated. X-rays reviewed 2.  Betadine wet-to-dry dressings were applied to the surgical foot.  Patient would benefit from home health dressing changes consisting of Betadine wet-to-dry  dressings every other day.  Orders placed today.  Patient is homebound and unable to come to the office regularly/routinely for dressing changes.  Patient is also unable to change her own dressings. 3.  Refill prescription for Keflex 500 mg 3 times daily for prophylaxis 4.  Continue weightbearing in the immobilization cam boot 5.  Return to clinic in 1 week   Edrick Kins, DPM [facility]  Dr. Edrick Kins, [address]                                        [place],  [address]                Office [phone]  Fax [phone]

## 2018-08-31 ENCOUNTER — Ambulatory Visit (INDEPENDENT_AMBULATORY_CARE_PROVIDER_SITE_OTHER): Payer: Medicare HMO | Admitting: Psychiatry

## 2018-08-31 ENCOUNTER — Encounter (HOSPITAL_COMMUNITY): Payer: Self-pay | Admitting: Psychiatry

## 2018-08-31 ENCOUNTER — Telehealth: Payer: Self-pay | Admitting: *Deleted

## 2018-08-31 DIAGNOSIS — F332 Major depressive disorder, recurrent severe without psychotic features: Secondary | ICD-10-CM

## 2018-08-31 DIAGNOSIS — F411 Generalized anxiety disorder: Secondary | ICD-10-CM | POA: Diagnosis not present

## 2018-08-31 DIAGNOSIS — G4700 Insomnia, unspecified: Secondary | ICD-10-CM | POA: Diagnosis not present

## 2018-08-31 DIAGNOSIS — F4310 Post-traumatic stress disorder, unspecified: Secondary | ICD-10-CM | POA: Diagnosis not present

## 2018-08-31 MED ORDER — TRAZODONE HCL 100 MG PO TABS: 200.0000 mg | ORAL_TABLET | Freq: Every evening | ORAL | 0 refills | Status: DC | PRN

## 2018-08-31 MED ORDER — DULOXETINE HCL 60 MG PO CPEP: 120.0000 mg | ORAL_CAPSULE | Freq: Every day | ORAL | 3 refills | Status: DC

## 2018-08-31 MED ORDER — BUPROPION HCL ER (SR) 200 MG PO TB12: 200.0000 mg | ORAL_TABLET | Freq: Every day | ORAL | 3 refills | Status: DC
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
## 2018-08-31 NOTE — Telephone Encounter (Signed)
Left message informing pt that we were ordering [place] and an agency will be calling either afternoon or tomorrow. Faxed required form, clinicals and demographics to [facility].

## 2018-08-31 NOTE — Telephone Encounter (Signed)
-----   Message from Edrick Kins, DPM sent at 08/30/2018  1:40 PM EDT ----- Regarding: Home health dressing changes Please order Home health dressing changes for patient.  Patient is homebound  Dx: s/p left forefoot reconstructive surgery. Heavy maceration left foot. DOS: 08/24/18  -Applied Betadine soaked 4 x 4 gauze between toes and to all incision sites.  -Dressed with dry 4 x 4 gauze, large Kerlix, Ace wrap or Coban.  -Reapply Immobilization cam boot  Thanks, Dr. Amalia Hailey

## 2018-09-11 ENCOUNTER — Ambulatory Visit (INDEPENDENT_AMBULATORY_CARE_PROVIDER_SITE_OTHER): Payer: Medicare Other | Admitting: Podiatry

## 2018-09-11 DIAGNOSIS — Z9889 Other specified postprocedural states: Secondary | ICD-10-CM

## 2018-09-11 DIAGNOSIS — M21611 Bunion of right foot: Secondary | ICD-10-CM

## 2018-09-11 DIAGNOSIS — M2042 Other hammer toe(s) (acquired), left foot: Secondary | ICD-10-CM

## 2018-09-11 DIAGNOSIS — M2041 Other hammer toe(s) (acquired), right foot: Secondary | ICD-10-CM

## 2018-09-11 DIAGNOSIS — M21612 Bunion of left foot: Secondary | ICD-10-CM

## 2018-09-12 NOTE — Progress Notes (Signed)
   Subjective:  Patient presents today status post left forefoot reconstructive surgery. DOS: 08/23/2018. She reports some continue sensitivity and tenderness around the pins. There are no modifying factors noted. She has been using the CAM boot as directed without issue. Patient is here for further evaluation and treatment.   Past Medical History:  Diagnosis Date  . Arthritis   . Cervicalgia   . Chondromalacia of patella   . Chronic neck pain   . Chronic pain syndrome   . Depression    secondary to loss of her son at age 44  . Diabetes mellitus   . Diabetic neuropathy ([place])   . DMII (diabetes mellitus, type 2) ([place])   . Enthesopathy of hip region   . Hep C w/o coma, chronic ([place])   . Hepatitis C    diagnosed 2005  . Hypertension   . Insomnia   . Insomnia   . Low back pain   . Lumbago   . Obesity   . Pain in joint, upper arm   . Primary localized osteoarthrosis, lower leg   . 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 Tobacco abuse   . 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  Skin incisions appear to be well coapted with sutures and staples intact. No dehiscence. No active bleeding noted.  Strong malodor noted with a heavy amount of maceration noted to the skin throughout the foot extending along the plantar aspect of the foot to the heel.  Malodor is likely due to skin maceration rather than infection.   Assessment: 1. s/p left forefoot reconstructive surgery. DOS: 08/23/2018 2. Heavy maceration left foot   Plan of Care:  1. Patient was evaluated.  2. Partial staples removed. Dry sterile dressing applied.  3. Continue home health dressing changes.  4. Continue weightbearing in CAM boot.  5. Return to clinic in two weeks.   Edrick Kins, DPM [facility]  Dr. Edrick Kins, [address]                                        [place], [place] [address]                Office  [phone]  Fax [phone]

## 2018-09-13 NOTE — Progress Notes (Signed)
DOS: 08-24-2018 LT; Bunionectomy with Osteotomy Tailors bunionectomy with osteotomy Hammer toe repair with capsulotomy 2-5  Dr. Durenda Guthrie

## 2018-10-02 ENCOUNTER — Ambulatory Visit (INDEPENDENT_AMBULATORY_CARE_PROVIDER_SITE_OTHER): Payer: Medicare Other

## 2018-10-02 ENCOUNTER — Ambulatory Visit (INDEPENDENT_AMBULATORY_CARE_PROVIDER_SITE_OTHER): Payer: Medicare Other | Admitting: Podiatry

## 2018-10-02 DIAGNOSIS — M2042 Other hammer toe(s) (acquired), left foot: Secondary | ICD-10-CM

## 2018-10-02 DIAGNOSIS — Z9889 Other specified postprocedural states: Secondary | ICD-10-CM

## 2018-10-02 DIAGNOSIS — M2041 Other hammer toe(s) (acquired), right foot: Secondary | ICD-10-CM

## 2018-10-02 DIAGNOSIS — M21612 Bunion of left foot: Secondary | ICD-10-CM

## 2018-10-02 DIAGNOSIS — M21611 Bunion of right foot: Secondary | ICD-10-CM

## 2018-10-03 NOTE — Progress Notes (Signed)
   Subjective:  Patient presents today status post left forefoot reconstructive surgery. DOS: 08/23/2018. She states the wound does not look well and wants it checked.  Patient is here for further evaluation and treatment. She has been using the CAM boot as directed without difficulty. Patient is here for further evaluation and treatment.   Past Medical History:  Diagnosis Date  . Arthritis   . Cervicalgia   . Chondromalacia of patella   . Chronic neck pain   . Chronic pain syndrome   . Depression    secondary to loss of her son at age 63  . Diabetes mellitus   . Diabetic neuropathy ([place])   . DMII (diabetes mellitus, type 2) ([place])   . Enthesopathy of hip region   . Hep C w/o coma, chronic ([place])   . Hepatitis C    diagnosed 2005  . Hypertension   . Insomnia   . Insomnia   . Low back pain   . Lumbago   . Obesity   . Pain in joint, upper arm   . Primary localized osteoarthrosis, lower leg   . 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 Tobacco abuse   . 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  Skin incisions appear to be well coapted with remaining staples intact. No dehiscence. No active bleeding noted.  Strong malodor noted with a heavy amount of maceration noted to the skin throughout the foot extending along the plantar aspect of the foot to the heel. Malodor is likely due to skin maceration rather than infection.   Assessment: 1. s/p left forefoot reconstructive surgery. DOS: 08/23/2018 2. Heavy maceration left foot   Plan of Care:  1. Patient was evaluated.  2. Remaining staples removed.  3. Percutaneous pins removed.  4. Post op shoe dispensed. Discontinue using CAM boot.   5. Return to clinic in 4 weeks.   Edrick Kins, DPM [facility]  Dr. Edrick Kins, [address]                                        [place], [place] [address]                Office [phone]  Fax [phone]

## 2018-10-04 ENCOUNTER — Telehealth: Payer: Self-pay | Admitting: Podiatry

## 2018-10-04 NOTE — Telephone Encounter (Signed)
This is [name] with [facility]. Ms. Stennett said she was in to see you guys this week or last week and I'm needing updated wound care orders sent to [place]. My number is [phone].

## 2018-10-05 NOTE — Telephone Encounter (Signed)
DR. Amalia Hailey states continue original wound care orders. Orders called to [facility].

## 2018-10-30 ENCOUNTER — Other Ambulatory Visit: Payer: Self-pay | Admitting: Podiatry

## 2018-10-30 ENCOUNTER — Ambulatory Visit (INDEPENDENT_AMBULATORY_CARE_PROVIDER_SITE_OTHER): Payer: Medicare Other | Admitting: Podiatry

## 2018-10-30 ENCOUNTER — Ambulatory Visit (INDEPENDENT_AMBULATORY_CARE_PROVIDER_SITE_OTHER): Payer: Medicare Other

## 2018-10-30 ENCOUNTER — Encounter: Payer: Self-pay | Admitting: Podiatry

## 2018-10-30 DIAGNOSIS — M21612 Bunion of left foot: Secondary | ICD-10-CM

## 2018-10-30 DIAGNOSIS — M2041 Other hammer toe(s) (acquired), right foot: Secondary | ICD-10-CM

## 2018-10-30 DIAGNOSIS — Z9889 Other specified postprocedural states: Secondary | ICD-10-CM

## 2018-10-30 DIAGNOSIS — M21611 Bunion of right foot: Secondary | ICD-10-CM

## 2018-10-30 DIAGNOSIS — M2042 Other hammer toe(s) (acquired), left foot: Secondary | ICD-10-CM

## 2018-10-31 NOTE — Progress Notes (Signed)
   Subjective:  Patient presents today status post left forefoot reconstructive surgery. DOS: 08/23/2018. She reports continued swelling of the foot. She notes associated stiffness of the toes. Patient is here for further evaluation and treatment. She has been using the post op shoe as directed. There are no modifying factors noted. Patient is here for further evaluation and treatment.   Past Medical History:  Diagnosis Date  . Arthritis   . Cervicalgia   . Chondromalacia of patella   . Chronic neck pain   . Chronic pain syndrome   . Depression    secondary to loss of her son at age 37  . Diabetes mellitus   . Diabetic neuropathy ([place])   . DMII (diabetes mellitus, type 2) ([place])   . Enthesopathy of hip region   . Hep C w/o coma, chronic ([place])   . Hepatitis C    diagnosed 2005  . Hypertension   . Insomnia   . Insomnia   . Low back pain   . Lumbago   . Obesity   . Pain in joint, upper arm   . Primary localized osteoarthrosis, lower leg   . 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 Tobacco abuse   . 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  Skin incisions appear to be well coapted. No dehiscence. No active bleeding noted. No signs of infection. Moderate edema noted to surgical extremity. Maceration resolved.   Radiographic Exam:  Orthopedic hardware and osteotomies sites appear to be stable with routine healing.   Assessment: 1. s/p left forefoot reconstructive surgery. DOS: 08/23/2018   Plan of Care:  1. Patient was evaluated. X-Rays reviewed.  2. Resume wearing good sneakers. Discontinue using post op shoe.  3. May resume full activity with no restrictions.  4. Return to clinic as needed.   Edrick Kins, DPM [facility]  Dr. Edrick Kins, [address]                                        [place],  [address]                Office [phone]  Fax [phone]

## 2018-11-22 ENCOUNTER — Ambulatory Visit (INDEPENDENT_AMBULATORY_CARE_PROVIDER_SITE_OTHER): Payer: Medicare Other | Admitting: Podiatry

## 2018-11-22 DIAGNOSIS — M79676 Pain in unspecified toe(s): Secondary | ICD-10-CM | POA: Diagnosis not present

## 2018-11-22 DIAGNOSIS — B351 Tinea unguium: Secondary | ICD-10-CM

## 2018-11-22 DIAGNOSIS — E0843 Diabetes mellitus due to underlying condition with diabetic autonomic (poly)neuropathy: Secondary | ICD-10-CM

## 2018-11-23 ENCOUNTER — Ambulatory Visit (INDEPENDENT_AMBULATORY_CARE_PROVIDER_SITE_OTHER): Payer: Medicare Other | Admitting: Psychiatry

## 2018-11-23 ENCOUNTER — Encounter (HOSPITAL_COMMUNITY): Payer: Self-pay | Admitting: Psychiatry

## 2018-11-23 DIAGNOSIS — F332 Major depressive disorder, recurrent severe without psychotic features: Secondary | ICD-10-CM | POA: Diagnosis not present

## 2018-11-23 DIAGNOSIS — F4310 Post-traumatic stress disorder, unspecified: Secondary | ICD-10-CM | POA: Diagnosis not present

## 2018-11-23 DIAGNOSIS — F411 Generalized anxiety disorder: Secondary | ICD-10-CM

## 2018-11-23 DIAGNOSIS — G4700 Insomnia, unspecified: Secondary | ICD-10-CM

## 2018-11-23 MED ORDER — DULOXETINE HCL 60 MG PO CPEP: 120.0000 mg | ORAL_CAPSULE | Freq: Every day | ORAL | 0 refills | Status: DC

## 2018-11-23 MED ORDER — BUPROPION HCL ER (SR) 200 MG PO TB12: 200.0000 mg | ORAL_TABLET | Freq: Every day | ORAL | 0 refills | Status: DC

## 2018-11-23 MED ORDER — TRAZODONE HCL 100 MG PO TABS: 250.0000 mg | ORAL_TABLET | Freq: Every evening | ORAL | 0 refills | Status: DC | PRN

## 2018-11-23 MED ORDER — BUPROPION HCL ER (SR) 200 MG PO TB12: 200.0000 mg | ORAL_TABLET | Freq: Two times a day (BID) | ORAL | 0 refills | Status: DC
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
## 2018-11-26 NOTE — Progress Notes (Signed)
   SUBJECTIVE 57 year old female presenting today status post left forefoot reconstructive surgery. DOS: 08/23/18. She reports some continued numbness of the foot and soreness to the dorsal aspect.  She also presents to office today complaining of elongated, thickened nails that cause pain while ambulating in shoes. She is unable to trim her own nails. Patient is here for further evaluation and treatment.   Past Medical History:  Diagnosis Date  . Arthritis   . Cervicalgia   . Chondromalacia of patella   . Chronic neck pain   . Chronic pain syndrome   . Depression    secondary to loss of her son at age 46  . Diabetes mellitus   . Diabetic neuropathy ([place])   . DMII (diabetes mellitus, type 2) ([place])   . Enthesopathy of hip region   . Hep C w/o coma, chronic ([place])   . Hepatitis C    diagnosed 2005  . Hypertension   . Insomnia   . Insomnia   . Low back pain   . Lumbago   . Obesity   . Pain in joint, upper arm   . Primary localized osteoarthrosis, lower leg   . 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 Tobacco abuse   . 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 Derm Skin incisions appear to be well coapted. No dehiscence. No active bleeding. Skin is dry and supple bilateral. Negative open lesions or macerations. Remaining integument unremarkable. Nails are tender, long, thickened and dystrophic with subungual debris, consistent with onychomycosis, 1-5 bilateral. No signs of infection noted. Vasc  DP and PT pedal pulses palpable bilaterally. Temperature gradient within normal limits.  Neuro Epicritic and protective threshold sensation diminished bilaterally.  Musculoskeletal Exam No symptomatic pedal deformities noted bilateral. Muscular strength within normal limits.  ASSESSMENT 1. Diabetes Mellitus w/ peripheral neuropathy 2. Onychomycosis of nail due to dermatophyte bilateral 3. S/p  left forefoot reconstruction surgery. DOS: 08/24/18.   PLAN OF CARE 1. Patient evaluated today. 2. Instructed to maintain good pedal hygiene and foot care. Stressed importance of controlling blood sugar.  3. Mechanical debridement of nails 1-5 bilaterally performed using a nail nipper. Filed with dremel without incident.  4. Recommended good shoe gear.  5. Return to clinic in 3 months for routine nail care.     Edrick Kins, DPM [facility]  Dr. Edrick Kins, [address]                                        [place], [place] [address]                Office [phone]  Fax [phone]

## 2019-01-01 ENCOUNTER — Ambulatory Visit (INDEPENDENT_AMBULATORY_CARE_PROVIDER_SITE_OTHER): Payer: Medicare Other | Admitting: Podiatry

## 2019-01-01 ENCOUNTER — Ambulatory Visit (INDEPENDENT_AMBULATORY_CARE_PROVIDER_SITE_OTHER): Payer: Medicare Other

## 2019-01-01 ENCOUNTER — Telehealth: Payer: Self-pay | Admitting: *Deleted

## 2019-01-01 ENCOUNTER — Encounter: Payer: Self-pay | Admitting: Podiatry

## 2019-01-01 ENCOUNTER — Other Ambulatory Visit: Payer: Self-pay | Admitting: Podiatry

## 2019-01-01 DIAGNOSIS — M205X2 Other deformities of toe(s) (acquired), left foot: Secondary | ICD-10-CM

## 2019-01-01 DIAGNOSIS — M21611 Bunion of right foot: Secondary | ICD-10-CM | POA: Diagnosis not present

## 2019-01-01 DIAGNOSIS — M898X7 Other specified disorders of bone, ankle and foot: Secondary | ICD-10-CM

## 2019-01-01 DIAGNOSIS — M779 Enthesopathy, unspecified: Principal | ICD-10-CM

## 2019-01-01 DIAGNOSIS — M2041 Other hammer toe(s) (acquired), right foot: Secondary | ICD-10-CM | POA: Diagnosis not present

## 2019-01-01 DIAGNOSIS — M778 Other enthesopathies, not elsewhere classified: Secondary | ICD-10-CM

## 2019-01-01 NOTE — Telephone Encounter (Signed)
"  I have a surgery that will be coming up in March on the 19th.  I just need you all address where you are located, the phone number, and the time so I can make sure I'll be there.  Call me back."

## 2019-01-01 NOTE — Progress Notes (Signed)
HPI: 58 year old female presents the office today for evaluation of a mallet toe contracture that is developed over the past several months to the left foot.  Patient had forefoot reconstructive surgery performed to the left foot on 08/23/2018.  Patient has no complaints and no pain other than the contracture that is developed to the left hallux. Patient also complains of pain to the right foot associated with her bunion and hammertoe deformities.  She also has a symptomatic dorsal bone spur on the top of her right foot.  Conservative treatments have been unsuccessful including shoe gear modifications and anti-inflammatories.  Past Medical History:  Diagnosis Date  . Arthritis   . Cervicalgia   . Chondromalacia of patella   . Chronic neck pain   . Chronic pain syndrome   . Depression    secondary to loss of her son at age 39  . Diabetes mellitus   . Diabetic neuropathy ([place])   . DMII (diabetes mellitus, type 2) ([place])   . Enthesopathy of hip region   . Hep C w/o coma, chronic ([place])   . Hepatitis C    diagnosed 2005  . Hypertension   . Insomnia   . Insomnia   . Low back pain   . Lumbago   . Obesity   . Pain in joint, upper arm   . Primary localized osteoarthrosis, lower leg   . 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 Tobacco abuse   . 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  Dermatology: Skin is warm, dry and supple bilateral lower extremities. Negative for open lesions or macerations.  Vascular: Palpable pedal pulses bilaterally. No edema or erythema noted. Capillary refill within normal limits.  Neurological: Epicritic and protective threshold grossly intact bilaterally.   Musculoskeletal Exam: Range of motion within normal limits to all pedal and ankle joints bilateral. Muscle strength 5/5 in all groups bilateral.  Flexible hammertoe deformity noted 2-5 right foot.  Hallux  abductovalgus deformity also noted right foot.  Rigid contracture noted to the MTPJ left hallux  Radiographic Exam:  Normal osseous mineralization. Joint spaces preserved. No fracture/dislocation/boney destruction.  Hammertoe contracture noted to digits 2-5 right foot.  Dorsal midfoot tarsal exostosis also noted to the right foot best visualized on lateral view.  Hallux abductovalgus bunion deformity noted to the right foot with an IM angle of approximately 15 degrees.  Assessment: 1.  Bunion right foot 2.  Hammertoes 2-5 right foot 3.  Dorsal midfoot exostosis right foot 4.  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 Patient evaluated. X-Rays reviewed.  2. Today we discussed the conservative versus surgical management of the presenting pathology. The patient opts for surgical management. All possible complications and details of the procedure were explained. All patient questions were answered. No guarantees were expressed or implied. 3. Authorization for surgery was initiated today. Surgery will consist of bunionectomy with first metatarsal osteotomy right.  Exostectomy right dorsal midfoot.  Hammertoe repair digits 2-5 right.  Digits 2-4 repair will include internal screw fixation.  The patient does not want percutaneous pin fixation.  Hallux IPJ arthrodesis left.  First MTPJ joint release left. 4.  Return to clinic 1 week postop        Edrick Kins, DPM [facility]  Dr. Edrick Kins, DPM  [address], [place] [address]                Office [phone]  Fax [phone]

## 2019-01-01 NOTE — Patient Instructions (Signed)
Pre-Operative Instructions  Congratulations, you have decided to take an important step towards improving your quality of life.  You can be assured that the doctors and staff at [facility] will be with you every step of the way.  Here are some important things you should know:  1. Plan to be at the surgery center/hospital at least 1 (one) hour prior to your scheduled time, unless otherwise directed by the surgical center/hospital staff.  You must have a responsible adult accompany you, remain during the surgery and drive you home.  Make sure you have directions to the surgical center/hospital to ensure you arrive on time. 2. If you are having surgery at [facility] or [facility] hospitals, you will need a copy of your medical history and physical form from your family physician within one month prior to the date of surgery. We will give you a form for your primary physician to complete.  3. We make every effort to accommodate the date you request for surgery.  However, there are times where surgery dates or times have to be moved.  We will contact you as soon as possible if a change in schedule is required.   4. No aspirin/ibuprofen for one week before surgery.  If you are on aspirin, any non-steroidal anti-inflammatory medications (Mobic, Aleve, Ibuprofen) should not be taken seven (7) days prior to your surgery.  You make take Tylenol for pain prior to surgery.  5. Medications - If you are taking daily heart and blood pressure medications, seizure, reflux, allergy, asthma, anxiety, pain or diabetes medications, make sure you notify the surgery center/hospital before the day of surgery so they can tell you which medications you should take or avoid the day of surgery. 6. No food or drink after midnight the night before surgery unless directed otherwise by surgical center/hospital staff. 7. No alcoholic beverages 24-hours prior to surgery.  No smoking 24-hours prior or 24-hours after  surgery. 8. Wear loose pants or shorts. They should be loose enough to fit over bandages, boots, and casts. 9. Don't wear slip-on shoes. Sneakers are preferred. 10. Bring your boot with you to the surgery center/hospital.  Also bring crutches or a walker if your physician has prescribed it for you.  If you do not have this equipment, it will be provided for you after surgery. 11. If you have not been contacted by the surgery center/hospital by the day before your surgery, call to confirm the date and time of your surgery. 12. Leave-time from work may vary depending on the type of surgery you have.  Appropriate arrangements should be made prior to surgery with your employer. 13. Prescriptions will be provided immediately following surgery by your doctor.  Fill these as soon as possible after surgery and take the medication as directed. Pain medications will not be refilled on weekends and must be approved by the doctor. 14. Remove nail polish on the operative foot and avoid getting pedicures prior to surgery. 15. Wash the night before surgery.  The night before surgery wash the foot and leg well with water and the antibacterial soap provided. Be sure to pay special attention to beneath the toenails and in between the toes.  Wash for at least three (3) minutes. Rinse thoroughly with water and dry well with a towel.  Perform this wash unless told not to do so by your physician.  Enclosed: 1 Ice pack (please put in freezer the night before surgery)   1 Hibiclens skin cleaner     Pre-op instructions  If you have any questions regarding the instructions, please do not hesitate to call our office.  [place]: [address], [place], [place] [address] -- [phone]  [place]: [address] [place], [place] [address] -- [phone]  [place]: [address]  [place], [place] [address] -- [phone]  [place]: [address], [address], [place], [place] [address] -- [phone]  Website:  [email or web address] 

## 2019-01-02 NOTE — Telephone Encounter (Signed)
I'm returning your call.  You need information about the surgical center?  "Yes, they offered me a bag when I was at your office but I told her I already had a bag.  The last time I had surgery, I was given two bags.  I need the name of the surgical center, their address, phone number, and the time I need to be there."  You're going to [facility].  Their address is [address].  Their phone number is [phone].  Someone from the surgical center will give you a call a day or two prior to your surgery date and they will give you your arrival time.  "Okay, that's all I needed."

## 2019-01-09 ENCOUNTER — Other Ambulatory Visit (HOSPITAL_COMMUNITY): Payer: Self-pay | Admitting: Psychiatry

## 2019-01-29 ENCOUNTER — Other Ambulatory Visit (HOSPITAL_COMMUNITY): Payer: Self-pay | Admitting: Psychiatry

## 2019-01-31 ENCOUNTER — Ambulatory Visit (INDEPENDENT_AMBULATORY_CARE_PROVIDER_SITE_OTHER): Payer: Medicare Other | Admitting: Podiatry

## 2019-01-31 DIAGNOSIS — M205X2 Other deformities of toe(s) (acquired), left foot: Secondary | ICD-10-CM | POA: Diagnosis not present

## 2019-01-31 DIAGNOSIS — M2041 Other hammer toe(s) (acquired), right foot: Secondary | ICD-10-CM | POA: Diagnosis not present

## 2019-01-31 DIAGNOSIS — M779 Enthesopathy, unspecified: Secondary | ICD-10-CM

## 2019-01-31 DIAGNOSIS — M21611 Bunion of right foot: Secondary | ICD-10-CM

## 2019-01-31 DIAGNOSIS — M778 Other enthesopathies, not elsewhere classified: Secondary | ICD-10-CM

## 2019-02-04 NOTE — Progress Notes (Signed)
   HPI: 58 year old female presenting today with a chief complaint of constant pain to the left lateral foot that began a few weeks ago. She states there is a "bone sticking out of her foot" that is causing the pain. She has been taking OTC pain relievers for treatment. She denies any known trauma or injury and describes this as more of a flare up. Walking and bearing weight increases her pain. Patient is here for further evaluation and treatment.   Past Medical History:  Diagnosis Date  . Arthritis   . Cervicalgia   . Chondromalacia of patella   . Chronic neck pain   . Chronic pain syndrome   . Depression    secondary to loss of her son at age 31  . Diabetes mellitus   . Diabetic neuropathy ([place])   . DMII (diabetes mellitus, type 2) ([place])   . Enthesopathy of hip region   . Hep C w/o coma, chronic ([place])   . Hepatitis C    diagnosed 2005  . Hypertension   . Insomnia   . Insomnia   . Low back pain   . Lumbago   . Obesity   . Pain in joint, upper arm   . Primary localized osteoarthrosis, lower leg   . 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 Tobacco abuse   . 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  Dermatology: Skin is warm, dry and supple bilateral lower extremities. Negative for open lesions or macerations.  Vascular: Palpable pedal pulses bilaterally. No edema or erythema noted. Capillary refill within normal limits.  Neurological: Epicritic and protective threshold grossly intact bilaterally.   Musculoskeletal Exam: Pain with palpation noted to the lateral aspect of the bilateral midfoot. Range of motion within normal limits to all pedal and ankle joints bilateral. Muscle strength 5/5 in all groups bilateral.   Assessment: 1. Bilateral lateral midfoot capsulitis    Plan of Care:  1. Patient evaluated.   2. Injection of 0.5 mLs Celestone Soluspan injected into the lateral  aspect of the bilateral midfoot.  3. Patient scheduled for surgery on 02/22/2019.  4. Return to clinic one week post op.      Edrick Kins, DPM [facility]  Dr. Edrick Kins, DPM    [address], [place] [address]                Office [phone]  Fax [phone]

## 2019-02-08 ENCOUNTER — Other Ambulatory Visit: Payer: Self-pay | Admitting: Sports Medicine

## 2019-02-08 DIAGNOSIS — M79673 Pain in unspecified foot: Secondary | ICD-10-CM

## 2019-02-21 ENCOUNTER — Ambulatory Visit (INDEPENDENT_AMBULATORY_CARE_PROVIDER_SITE_OTHER): Payer: Medicare Other | Admitting: Podiatry

## 2019-02-21 ENCOUNTER — Other Ambulatory Visit: Payer: Self-pay

## 2019-02-21 DIAGNOSIS — M79676 Pain in unspecified toe(s): Secondary | ICD-10-CM | POA: Diagnosis not present

## 2019-02-21 DIAGNOSIS — B351 Tinea unguium: Secondary | ICD-10-CM

## 2019-02-21 NOTE — Patient Instructions (Signed)
Diabetes Mellitus and Foot Care Foot care is an important part of your health, especially when you have diabetes. Diabetes may cause you to have problems because of poor blood flow (circulation) to your feet and legs, which can cause your skin to:  Become thinner and drier.  Break more easily.  Heal more slowly.  Peel and crack. You may also have nerve damage (neuropathy) in your legs and feet, causing decreased feeling in them. This means that you may not notice minor injuries to your feet that could lead to more serious problems. Noticing and addressing any potential problems early is the best way to prevent future foot problems. How to care for your feet Foot hygiene  Wash your feet daily with warm water and mild soap. Do not use hot water. Then, pat your feet and the areas between your toes until they are completely dry. Do not soak your feet as this can dry your skin.  Trim your toenails straight across. Do not dig under them or around the cuticle. File the edges of your nails with an emery board or nail file.  Apply a moisturizing lotion or petroleum jelly to the skin on your feet and to dry, brittle toenails. Use lotion that does not contain alcohol and is unscented. Do not apply lotion between your toes. Shoes and socks  Wear clean socks or stockings every day. Make sure they are not too tight. Do not wear knee-high stockings since they may decrease blood flow to your legs.  Wear shoes that fit properly and have enough cushioning. Always look in your shoes before you put them on to be sure there are no objects inside.  To break in new shoes, wear them for just a few hours a day. This prevents injuries on your feet. Wounds, scrapes, corns, and calluses  Check your feet daily for blisters, cuts, bruises, sores, and redness. If you cannot see the bottom of your feet, use a mirror or ask someone for help.  Do not cut corns or calluses or try to remove them with medicine.  If you  find a minor scrape, cut, or break in the skin on your feet, keep it and the skin around it clean and dry. You may clean these areas with mild soap and water. Do not clean the area with peroxide, alcohol, or iodine.  If you have a wound, scrape, corn, or callus on your foot, look at it several times a day to make sure it is healing and not infected. Check for: ? Redness, swelling, or pain. ? Fluid or blood. ? Warmth. ? Pus or a bad smell. General instructions  Do not cross your legs. This may decrease blood flow to your feet.  Do not use heating pads or hot water bottles on your feet. They may burn your skin. If you have lost feeling in your feet or legs, you may not know this is happening until it is too late.  Protect your feet from hot and cold by wearing shoes, such as at the beach or on hot pavement.  Schedule a complete foot exam at least once a year (annually) or more often if you have foot problems. If you have foot problems, report any cuts, sores, or bruises to your health care provider immediately. Contact a health care provider if:  You have a medical condition that increases your risk of infection and you have any cuts, sores, or bruises on your feet.  You have an injury that is not   healing.  You have redness on your legs or feet.  You feel burning or tingling in your legs or feet.  You have pain or cramps in your legs and feet.  Your legs or feet are numb.  Your feet always feel cold.  You have pain around a toenail. Get help right away if:  You have a wound, scrape, corn, or callus on your foot and: ? You have pain, swelling, or redness that gets worse. ? You have fluid or blood coming from the wound, scrape, corn, or callus. ? Your wound, scrape, corn, or callus feels warm to the touch. ? You have pus or a bad smell coming from the wound, scrape, corn, or callus. ? You have a fever. ? You have a red line going up your leg. Summary  Check your feet every day  for cuts, sores, red spots, swelling, and blisters.  Moisturize feet and legs daily.  Wear shoes that fit properly and have enough cushioning.  If you have foot problems, report any cuts, sores, or bruises to your health care provider immediately.  Schedule a complete foot exam at least once a year (annually) or more often if you have foot problems. This information is not intended to replace advice given to you by your health care provider. Make sure you discuss any questions you have with your health care provider. Document Released: 11/19/2000 Document Revised: 01/04/2018 Document Reviewed: 12/24/2016 Elsevier Interactive Patient Education  2019 [employer] Inc[employer]  Onychomycosis/Fungal Toenails  WHAT IS IT? An infection that lies within the keratin of your nail plate that is caused by a fungus.  WHY ME? Fungal infections affect all ages, sexes, races, and creeds.  There may be many factors that predispose you to a fungal infection such as age, coexisting medical conditions such as diabetes, or an autoimmune disease; stress, medications, fatigue, genetics, etc.  Bottom line: fungus thrives in a warm, moist environment and your shoes offer such a location.  IS IT CONTAGIOUS? Theoretically, yes.  You do not want to share shoes, nail clippers or files with someone who has fungal toenails.  Walking around barefoot in the same room or sleeping in the same bed is unlikely to transfer the organism.  It is important to realize, however, that fungus can spread easily from one nail to the next on the same foot.  HOW DO WE TREAT THIS?  There are several ways to treat this condition.  Treatment may depend on many factors such as age, medications, pregnancy, liver and kidney conditions, etc.  It is best to ask your doctor which options are available to you.  1. No treatment.   Unlike many other medical concerns, you can live with this condition.  However for many people this can be a painful condition and  may lead to ingrown toenails or a bacterial infection.  It is recommended that you keep the nails cut short to help reduce the amount of fungal nail. 2. Topical treatment.  These range from herbal remedies to prescription strength nail lacquers.  About 40-50% effective, topicals require twice daily application for approximately 9 to 12 months or until an entirely new nail has grown out.  The most effective topicals are medical grade medications available through physicians offices. 3. Oral antifungal medications.  With an 80-90% cure rate, the most common oral medication requires 3 to 4 months of therapy and stays in your system for a year as the new nail grows out.  Oral antifungal medications do require   blood work to make sure it is a safe drug for you.  A liver function panel will be performed prior to starting the medication and after the first month of treatment.  It is important to have the blood work performed to avoid any harmful side effects.  In general, this medication safe but blood work is required. 4. Laser Therapy.  This treatment is performed by applying a specialized laser to the affected nail plate.  This therapy is noninvasive, fast, and non-painful.  It is not covered by insurance and is therefore, out of pocket.  The results have been very good with a 80-95% cure rate.  The [facility] is the only practice in the area to offer this therapy. 5. Permanent Nail Avulsion.  Removing the entire nail so that a new nail will not grow back. 

## 2019-02-21 NOTE — Progress Notes (Signed)
Subjective: [name] presents today with painful, thick toenails 1-5 b/l that she cannot cut and which interfere with daily activities.  Pain is aggravated when wearing enclosed shoe gear.  She has attempted to cut her toenails herself and has cut herself several times doing so and she is also on Eliquis.  Patient states she is scheduled to have surgery on her left foot tomorrow with Dr. Amalia Hailey.  Rocky Morel, MD    Current Outpatient Medications:  .  ACCU-CHEK FASTCLIX LANCETS MISC, 1 each by Does not apply route daily. Check blood sugar once daily. 250.00, Disp: 102 each, Rfl: 4 .  albuterol (PROVENTIL HFA;VENTOLIN HFA) 108 (90 Base) MCG/ACT inhaler, Inhale 2 puffs into the lungs every 6 (six) hours as needed for wheezing or shortness of breath. , Disp: , Rfl:  .  apixaban (ELIQUIS) 5 MG TABS tablet, Take 5 mg by mouth 2 (two) times daily. , Disp: , Rfl:  .  atorvastatin (LIPITOR) 20 MG tablet, Take 20 mg by mouth daily., Disp: , Rfl:  .  Biotin 10 MG CAPS, Take 10 mg by mouth daily., Disp: , Rfl:  .  buPROPion (WELLBUTRIN SR) 150 MG 12 hr tablet, , Disp: , Rfl:  .  buPROPion (WELLBUTRIN SR) 200 MG 12 hr tablet, TK 1 T PO D, Disp: , Rfl:  .  Calcium Carbonate (CALCARB 600) 1500 MG TABS, Take 1 tablet (1,500 mg total) by mouth 2 (two) times daily., Disp: 60 tablet, Rfl: 0 .  celecoxib (CELEBREX) 200 MG capsule, Take 200 mg by mouth 2 (two) times daily. , Disp: , Rfl:  .  cephALEXin (KEFLEX) 500 MG capsule, Take 1 capsule (500 mg total) by mouth 3 (three) times daily., Disp: 30 capsule, Rfl: 0 .  CHANTIX CONTINUING MONTH PAK 1 MG tablet, , Disp: , Rfl:  .  Cholecalciferol (VITAMIN D-1000 MAX ST) 1000 units tablet, Take by mouth., Disp: , Rfl:  .  clotrimazole-betamethasone (LOTRISONE) cream, Apply 1 application topically 2 (two) times daily., Disp: , Rfl:  .  Cyanocobalamin (VITAMIN B 12 PO), Take 50 mcg by mouth., Disp: , Rfl:  .  cyclobenzaprine (FLEXERIL) 5 MG  tablet, Take 1 tablet (5 mg total) by mouth at bedtime., Disp: 30 tablet, Rfl: 0 .  DEXILANT 60 MG capsule, , Disp: , Rfl:  .  diltiazem (CARDIZEM CD) 120 MG 24 hr capsule, Take by mouth., Disp: , Rfl:  .  diltiazem (DILACOR XR) 240 MG 24 hr capsule, Take 240 mg by mouth daily., Disp: , Rfl:  .  DULoxetine (CYMBALTA) 60 MG capsule, TAKE 2 CAPSULE BY MOUTH EVERY DAY, Disp: 60 capsule, Rfl: 1 .  DULoxetine (CYMBALTA) 60 MG capsule, , Disp: , Rfl:  .  ergocalciferol (VITAMIN D2) 50000 UNITS capsule, Take 50,000 Units by mouth every Sunday. , Disp: , Rfl:  .  estrogens, conjugated, (PREMARIN) 0.3 MG tablet, Take 0.3 mg by mouth daily. Take daily for 21 days then do not take for 7 days., Disp: , Rfl:  .  ferrous sulfate 325 (65 FE) MG tablet, Take 325 mg by mouth daily with breakfast., Disp: , Rfl:  .  glucose blood (ACCU-CHEK SMARTVIEW) test strip, Check blood sugar once daily. 250.00, Disp: 50 each, Rfl: 12 .  guaiFENesin (MUCINEX) 600 MG 12 hr tablet, Take by mouth daily., Disp: , Rfl:  .  hydroxypropyl methylcellulose / hypromellose (ISOPTO TEARS / GONIOVISC) 2.5 % ophthalmic solution, Place 1 drop into both eyes 3 (three) times daily as needed  for dry eyes., Disp: , Rfl:  .  hydrOXYzine (ATARAX/VISTARIL) 25 MG tablet, , Disp: , Rfl:  .  ibuprofen (ADVIL,MOTRIN) 800 MG tablet, TAKE 1 TABLET(800 MG) BY MOUTH EVERY 8 HOURS AS NEEDED FOR MILD PAIN OR MODERATE PAIN, Disp: 30 tablet, Rfl: 0 .  Iron-FA-B Cmp-C-Biot-Probiotic (FUSION PLUS PO), Take 1 tablet by mouth daily. Reported on 03/09/2016, Disp: , Rfl:  .  LACTOBACILLUS PO, Take by mouth., Disp: , Rfl:  .  Lidocaine 4 % PTCH, Apply 2 patches topically daily., Disp: , Rfl:  .  linaclotide (LINZESS) 145 MCG CAPS capsule, Take 145 mcg by mouth daily before breakfast., Disp: , Rfl:  .  linagliptin (TRADJENTA) 5 MG TABS tablet, Take 5 mg by mouth daily., Disp: , Rfl:  .  losartan (COZAAR) 50 MG tablet, Take 50 mg by mouth daily., Disp: , Rfl:  .   lubiprostone (AMITIZA) 24 MCG capsule, Take 24 mcg by mouth 2 (two) times daily., Disp: , Rfl:  .  metoCLOPramide (REGLAN) 10 MG tablet, TK 1 T PO HS, Disp: , Rfl:  .  mupirocin ointment (BACTROBAN) 2 %, Apply topically 3 (three) times daily., Disp: 22 g, Rfl: 5 .  naloxone (NARCAN) nasal spray 4 mg/0.1 mL, Place 1 spray into the nose., Disp: , Rfl:  .  nystatin (NYSTATIN) powder, Apply topically 3 (three) times daily., Disp: , Rfl:  .  omeprazole (PRILOSEC) 20 MG capsule, Take 1 capsule (20 mg total) by mouth daily., Disp: 30 capsule, Rfl: 6 .  Oxycodone HCl 10 MG TABS, Take 10 mg by mouth 4 (four) times daily. Reported on 11/26/2015, Disp: , Rfl:  .  oxyCODONE-acetaminophen (PERCOCET) 5-325 MG tablet, Take 1 tablet by mouth every 4 (four) hours as needed for severe pain., Disp: 30 tablet, Rfl: 0 .  piroxicam (FELDENE) 10 MG capsule, TAKE 1 CAPSULE BY MOUTH DAILY, Disp: 30 capsule, Rfl: PRN .  potassium chloride (MICRO-K) 10 MEQ CR capsule, Take 20 mEq by mouth daily., Disp: , Rfl:  .  pregabalin (LYRICA) 200 MG capsule, , Disp: , Rfl:  .  RESTASIS 0.05 % ophthalmic emulsion, INT 1 GTT INTO OU BID, Disp: , Rfl:  .  rivaroxaban (XARELTO) 20 MG TABS tablet, Take 20 mg by mouth daily with supper., Disp: , Rfl:  .  traZODone (DESYREL) 100 MG tablet, Take 2.5 tablets (250 mg total) by mouth at bedtime as needed for sleep., Disp: 225 tablet, Rfl: 0 .  VOLTAREN 1 % GEL, APP 1 GRAM EXT AA D UTD, Disp: , Rfl: 0 .  XIIDRA 5 % SOLN, INT 1 GTT INTO OU BID, Disp: , Rfl:   Current Facility-Administered Medications:  .  ceFAZolin (ANCEF) IVPB 1 g/50 mL premix, 1 g, Intravenous, Once, Mohammed Kindle, MD .  lactated ringers infusion 1,000 mL, 1,000 mL, Intravenous, Continuous, Mohammed Kindle, MD .  sodium chloride 0.9 % injection 20 mL, 20 mL, Other, Once, Mohammed Kindle, MD .  sodium chloride flush (NS) 0.9 % injection 20 mL, 20 mL, Other, Once, Mohammed Kindle, MD .  triamcinolone acetonide (KENALOG) 10 MG/ML  injection 10 mg, 10 mg, Other, Once, Evelina Bucy, DPM .  triamcinolone acetonide (KENALOG-40) injection 40 mg, 40 mg, Other, Once, Mohammed Kindle, MD  Allergies  Allergen Reactions  . Hydrocodone-Acetaminophen Itching  . Hydrocodone Itching    Objective:  Vascular Examination: Capillary refill time immediate x 10 digits.  Dorsalis pedis and Posterior tibial pulses palpable b/l.  Digital hair present x 10 digits.  Skin temperature gradient WNL b/l.  Dermatological Examination: Skin with normal turgor, texture and tone b/l  Toenails 1-5 b/l discolored, thick, dystrophic with subungual debris and pain with palpation to nailbeds due to thickness of nails.  Well healed surgical surgical scars left foot dorsal midfoot and dorsal aspect metatarsals 2, 3, 4, 5  Musculoskeletal: Muscle strength 5/5 to all LE muscle groups  Cavus foot type noted bilaterally  No gross bony deformities b/l.  No pain, crepitus or joint limitation noted with ROM.   Neurological: Sensation intact with 10 gram monofilament. Vibratory sensation intact.  Assessment: Painful onychomycosis toenails 1-5 b/l   Plan: 1. Toenails 1-5 b/l were debrided in length and girth without iatrogenic bleeding. 2. Patient to continue soft, supportive shoe gear 3. Patient to report any pedal injuries to medical professional immediately. 4. Follow up 3 months.  5. Patient/POA to call should there be a concern in the interim.

## 2019-02-22 ENCOUNTER — Encounter: Payer: Self-pay | Admitting: Podiatry

## 2019-03-01 ENCOUNTER — Telehealth: Payer: Self-pay | Admitting: *Deleted

## 2019-03-01 ENCOUNTER — Other Ambulatory Visit: Payer: Self-pay

## 2019-03-01 ENCOUNTER — Ambulatory Visit (INDEPENDENT_AMBULATORY_CARE_PROVIDER_SITE_OTHER): Payer: Medicaid Other | Admitting: Psychiatry

## 2019-03-01 DIAGNOSIS — Z5329 Procedure and treatment not carried out because of patient's decision for other reasons: Secondary | ICD-10-CM

## 2019-03-01 NOTE — Progress Notes (Signed)
I called the patient at our scheduled appointment time.  There was no answer and I left a voice message for call back.  I called the patient again about an hour later and there was no answer.  Patient did not call back.

## 2019-03-01 NOTE — Telephone Encounter (Signed)
I am calling to reschedule your surgery.  Can you do it on Apr 26, 2019?  "Yes, that date will be fine.  Someone will call me a day or two before the surgery, right?"  Yes, someone from the surgical center will call you a day or two prior to your surgery date and they will give you your arrival time.

## 2019-03-05 ENCOUNTER — Other Ambulatory Visit: Payer: Medicare Other

## 2019-03-07 ENCOUNTER — Other Ambulatory Visit (HOSPITAL_COMMUNITY): Payer: Self-pay | Admitting: Psychiatry

## 2019-03-26 ENCOUNTER — Ambulatory Visit (INDEPENDENT_AMBULATORY_CARE_PROVIDER_SITE_OTHER): Payer: Medicare Other | Admitting: Podiatry

## 2019-03-26 ENCOUNTER — Other Ambulatory Visit: Payer: Self-pay

## 2019-03-26 ENCOUNTER — Ambulatory Visit: Payer: Medicare Other

## 2019-03-26 VITALS — Temp 97.3°F

## 2019-03-26 DIAGNOSIS — M778 Other enthesopathies, not elsewhere classified: Secondary | ICD-10-CM

## 2019-03-26 DIAGNOSIS — M779 Enthesopathy, unspecified: Secondary | ICD-10-CM | POA: Diagnosis not present

## 2019-03-26 DIAGNOSIS — M898X9 Other specified disorders of bone, unspecified site: Secondary | ICD-10-CM

## 2019-03-26 NOTE — Progress Notes (Signed)
HPI: 58 year old female presents the office today for new complaint regarding some pain and tenderness to the outside of the left foot.  Patient is scheduled for surgery on 04/26/2019.  She says that she has a prominent bump on the outside of her left foot but is been ongoing for several months despite shoe gear modification she continues to have pain and tenderness.  She would like to have it addressed and possible surgical intervention since she is going to have surgery anyways coming up in May.  Past Medical History:  Diagnosis Date  . Arthritis   . Cervicalgia   . Chondromalacia of patella   . Chronic neck pain   . Chronic pain syndrome   . Depression    secondary to loss of her son at age 83  . Diabetes mellitus   . Diabetic neuropathy ([place])   . DMII (diabetes mellitus, type 2) ([place])   . Enthesopathy of hip region   . Hep C w/o coma, chronic ([place])   . Hepatitis C    diagnosed 2005  . Hypertension   . Insomnia   . Insomnia   . Low back pain   . Lumbago   . Obesity   . Pain in joint, upper arm   . Primary localized osteoarthrosis, lower leg   . 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 Tobacco abuse   . 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  Dermatology: Skin is warm, dry and supple bilateral lower extremities. Negative for open lesions or macerations.  Vascular: Palpable pedal pulses bilaterally. No edema or erythema noted. Capillary refill within normal limits.  Neurological: Epicritic and protective threshold grossly intact bilaterally.   Musculoskeletal Exam: Range of motion within normal limits to all pedal and ankle joints bilateral. Muscle strength 5/5 in all groups bilateral.  There is a very prominent palpable exostosis of the anterior process of the calcaneus.  This is very tender to palpation.  This is not as prominent on the contralateral limb.  Radiographic  Exam taken 01/01/2019:  Normal osseous mineralization. Joint spaces preserved. No fracture/dislocation/boney destruction.  Radiographic exam does demonstrate a very prominent anterior process of the calcaneus.  Assessment: 1.  Bony exostosis anterior process calcaneus left   Plan of Care:  1. Patient evaluated. X-Rays reviewed that were taken 01/01/2019.  2.  Today we want to add exostectomy anterior process of the calcaneus to the left foot.  Patient has tried multiple conservative modalities and it is not provided any significant alleviation of symptoms for the patient.  All possible complications and details the procedure were explained.  No guarantees were expressed or implied.  All patient questions answered. 3.  Prior authorization for surgery which was initiated on 01/01/2019 consists ofbunionectomy with first metatarsal osteotomy right.  Exostectomy right dorsal midfoot.  Hammertoe repair digits 2-5 right.  Digits 2-4 repair will include internal screw fixation.  The patient does not want percutaneous pin fixation.  Hallux IPJ arthrodesis left.  First MTPJ joint release left. 4.  Return to clinic 1 week postop      Edrick Kins, DPM [facility]  Dr. Edrick Kins, DPM    [address]  [place], [place] [address]                Office [phone]  Fax [phone]

## 2019-04-02 ENCOUNTER — Other Ambulatory Visit (HOSPITAL_COMMUNITY): Payer: Self-pay | Admitting: Psychiatry

## 2019-04-11 ENCOUNTER — Telehealth: Payer: Self-pay | Admitting: *Deleted

## 2019-04-11 NOTE — Telephone Encounter (Signed)
Pt states she won't have anyone at home to help her after surgery and would like to have rehab set up for her.

## 2019-04-11 NOTE — Telephone Encounter (Signed)
Pt called states she is having surgery 04/25/2009 and would like to be admitted to rehab post-op.

## 2019-04-13 ENCOUNTER — Telehealth: Payer: Self-pay | Admitting: *Deleted

## 2019-04-13 NOTE — Telephone Encounter (Signed)
I informed pt the Surgery coordinator [phone] takes care of orders that needed to be ordered/scheduled prior to surgery.

## 2019-04-13 NOTE — Telephone Encounter (Signed)
I am calling to let you know that Dr. Amalia Hailey cannot have you admitted into a rehab after your surgery.  You do not meet the criteria for admission.  You surgery is not extensive enough for you to be admitted.  You health is not critical enough to be admitted.  "Okay, thanks for checking."

## 2019-04-13 NOTE — Telephone Encounter (Signed)
DOS 04/26/2019, CPT CODES: 43329, 28100, 28104, 28285 X 4, 51884 [place] BUNIONECTOMY, CALCANEAL EXOSTECTOMY, AND TARSAL EXOSTECTOMY OF THE RIGHT FOOT, HAMMER TOE REPAIR X 4, HALLUX IPJ FUSION OF THE LEFT FOOT  [employer]: Active Coverage: 12/06/2018 - -  Individual In-Network (Service Year) Deductible Member's plan does not have a deductible  Out-of-Pocket $0.00 MET YTD  $6,700.00 remaining  $6,700.00 Plan Amt.  You pay $0.00 if you are enrolled in Medicaid as a Qualified Medicare Beneficiary (QMB). $0.00 if you are enrolled in [place] with full benefits as a non-QMB, except for services that are not covered by the state Medicaid program.      This [employer] Medicare Advantage members plan does not currently require a prior authorization for these services. If you have general questions about the prior authorization requirements, please call us at [phone] or visit [email or web address] > Clinician Resources > Advance and Admission Notification Requirements. The number above acknowledges your notification. Please write this number down for future reference. Notification is not a guarantee of coverage or payment.  Decision ID #:[id number]

## 2019-04-13 NOTE — Telephone Encounter (Signed)
Pt states she would like to go into rehab after her surgery.

## 2019-04-23 ENCOUNTER — Telehealth: Payer: Self-pay | Admitting: *Deleted

## 2019-04-23 NOTE — Telephone Encounter (Signed)
"  I'm supposed to be having surgery on the twenty-first with Dr. Amalia Hailey.  I'm calling to see if you can write a note for my niece for work because she's going to be bringing me to the surgery center.  Her name is Lacretia Leigh.  She works at the [employer].  Could you call me when you get this message.  Thank you so much."

## 2019-04-24 ENCOUNTER — Encounter: Payer: Self-pay | Admitting: *Deleted

## 2019-04-24 ENCOUNTER — Telehealth: Payer: Self-pay | Admitting: Podiatry

## 2019-04-24 NOTE — Telephone Encounter (Signed)
I called and informed the patient I would type a letter as requested.  I informed her I would leave it at the front desk.  She said her niece would come by to pick it up on Thursday.

## 2019-04-24 NOTE — Progress Notes (Signed)
[name] called and requested a letter to give to her niece, Lacretia Leigh.  She stated her niece needs a letter for work so she can take the time off to transport and assist her with her after surgery care.

## 2019-04-24 NOTE — Telephone Encounter (Signed)
Patient is scheduled for surgery this Thursday and her niece is needing a note from our office to give to her employer in order to take off work and drive the patient to surgery and back home. Please give patient a call.

## 2019-04-25 ENCOUNTER — Telehealth: Payer: Self-pay | Admitting: *Deleted

## 2019-04-25 NOTE — Telephone Encounter (Signed)
I called pt and she states she now has a note for her sister.

## 2019-04-25 NOTE — Telephone Encounter (Signed)
Pt called states she needs a note for her sister to get out of work to take her to and from her surgery. Pt called twice.

## 2019-04-26 ENCOUNTER — Telehealth: Payer: Self-pay | Admitting: *Deleted

## 2019-04-26 ENCOUNTER — Encounter: Payer: Self-pay | Admitting: Podiatry

## 2019-04-26 DIAGNOSIS — M2042 Other hammer toe(s) (acquired), left foot: Secondary | ICD-10-CM | POA: Diagnosis not present

## 2019-04-26 DIAGNOSIS — M25775 Osteophyte, left foot: Secondary | ICD-10-CM | POA: Diagnosis not present

## 2019-04-26 DIAGNOSIS — M7752 Other enthesopathy of left foot: Secondary | ICD-10-CM | POA: Diagnosis not present

## 2019-04-26 DIAGNOSIS — M2011 Hallux valgus (acquired), right foot: Secondary | ICD-10-CM | POA: Diagnosis not present

## 2019-04-26 DIAGNOSIS — M25774 Osteophyte, right foot: Secondary | ICD-10-CM | POA: Diagnosis not present

## 2019-04-26 DIAGNOSIS — M2041 Other hammer toe(s) (acquired), right foot: Secondary | ICD-10-CM | POA: Diagnosis not present

## 2019-04-26 DIAGNOSIS — M7751 Other enthesopathy of right foot: Secondary | ICD-10-CM | POA: Diagnosis not present

## 2019-04-26 DIAGNOSIS — M2021 Hallux rigidus, right foot: Secondary | ICD-10-CM | POA: Diagnosis not present

## 2019-04-26 NOTE — Telephone Encounter (Signed)
Left message [facility] - Dr. Laurin Coder, requesting medical clearance to give pt additional pain medication after surgery today with Dr. Amalia Hailey, to fax information to [phone] or call [phone].

## 2019-04-26 NOTE — Telephone Encounter (Signed)
Pt's str, [name] presented to office stating Dr. Laurin Coder - [facility] had said Dr. Amalia Hailey could give her additional pain medication after surgery.

## 2019-04-26 NOTE — Telephone Encounter (Signed)
Left message informing Dr. Amalia Hailey, I had left a message for pt's pain management Dr. Laurin Coder to give medical clearance for additional pain medication, and was waiting for call back.

## 2019-04-27 ENCOUNTER — Encounter (HOSPITAL_BASED_OUTPATIENT_CLINIC_OR_DEPARTMENT_OTHER): Payer: Self-pay | Admitting: *Deleted

## 2019-04-27 ENCOUNTER — Telehealth: Payer: Self-pay | Admitting: Podiatry

## 2019-04-27 ENCOUNTER — Other Ambulatory Visit: Payer: Self-pay

## 2019-04-27 ENCOUNTER — Emergency Department (HOSPITAL_BASED_OUTPATIENT_CLINIC_OR_DEPARTMENT_OTHER)
Admission: EM | Admit: 2019-04-27 | Discharge: 2019-04-27 | Disposition: A | Discharge status: Home / Self Care | Payer: Medicare Other | Attending: Emergency Medicine | Admitting: Emergency Medicine

## 2019-04-27 DIAGNOSIS — Z79899 Other long term (current) drug therapy: Secondary | ICD-10-CM | POA: Diagnosis not present

## 2019-04-27 DIAGNOSIS — L7632 Postprocedural hematoma of skin and subcutaneous tissue following other procedure: Secondary | ICD-10-CM | POA: Insufficient documentation

## 2019-04-27 DIAGNOSIS — E119 Type 2 diabetes mellitus without complications: Secondary | ICD-10-CM | POA: Diagnosis not present

## 2019-04-27 DIAGNOSIS — Z7984 Long term (current) use of oral hypoglycemic drugs: Secondary | ICD-10-CM | POA: Diagnosis not present

## 2019-04-27 DIAGNOSIS — R58 Hemorrhage, not elsewhere classified: Secondary | ICD-10-CM

## 2019-04-27 DIAGNOSIS — F329 Major depressive disorder, single episode, unspecified: Secondary | ICD-10-CM | POA: Insufficient documentation

## 2019-04-27 DIAGNOSIS — M778 Other enthesopathies, not elsewhere classified: Secondary | ICD-10-CM

## 2019-04-27 DIAGNOSIS — Z7901 Long term (current) use of anticoagulants: Secondary | ICD-10-CM | POA: Diagnosis not present

## 2019-04-27 DIAGNOSIS — I10 Essential (primary) hypertension: Secondary | ICD-10-CM | POA: Diagnosis not present

## 2019-04-27 DIAGNOSIS — G8918 Other acute postprocedural pain: Secondary | ICD-10-CM | POA: Insufficient documentation

## 2019-04-27 DIAGNOSIS — Z9889 Other specified postprocedural states: Secondary | ICD-10-CM

## 2019-04-27 DIAGNOSIS — F17210 Nicotine dependence, cigarettes, uncomplicated: Secondary | ICD-10-CM | POA: Insufficient documentation

## 2019-04-27 HISTORY — DX: Fibromyalgia: M79.7

## 2019-04-27 MED ORDER — OXYCODONE HCL 15 MG PO TABS: 15.0000 mg | ORAL_TABLET | ORAL | 0 refills | Status: DC | PRN

## 2019-04-27 NOTE — Telephone Encounter (Signed)
Left message informing pt I had left a message with [facility] Dr. Laurin Coder yesterday and had not heard anything from that office concerning additional pain medication post op, that she should contact their office for pain management. I also informed the wheelchair had been ordered.

## 2019-04-27 NOTE — ED Provider Notes (Signed)
[facility] Provider Note   CSN: [id number] Arrival date & time: 04/27/19  2154    History   Chief Complaint Chief Complaint  Patient presents with  . Post-op Problem    HPI [name] is a 58 y.o. female.     The history is provided by the patient and medical records. No language interpreter was used.  Wound Check  This is a new problem. The current episode started yesterday. The problem occurs constantly. The problem has not changed since onset.Pertinent negatives include no chest pain, no abdominal pain, no headaches and no shortness of breath. Nothing aggravates the symptoms. Nothing relieves the symptoms. She has tried nothing for the symptoms. The treatment provided no relief.    Past Medical History:  Diagnosis Date  . Arthritis   . Cervicalgia   . Chondromalacia of patella   . Chronic neck pain   . Chronic pain syndrome   . Depression    secondary to loss of her son at age 49  . Diabetes mellitus   . Diabetic neuropathy ([place])   . DMII (diabetes mellitus, type 2) ([place])   . Enthesopathy of hip region   . Fibromyalgia   . Hep C w/o coma, chronic ([place])   . Hepatitis C    diagnosed 2005  . Hypertension   . Insomnia   . Insomnia   . Low back pain   . Lumbago   . Obesity   . Pain in joint, upper arm   . Primary localized osteoarthrosis, lower leg   . 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 Tobacco abuse   . 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 Lateral epicondylitis of right elbow 03/31/2018  . Arthritis of knee 03/09/2018  . Arthritis of foot, degenerative 02/28/2018  . Hallux valgus (acquired), left foot 02/28/2018  . Long term current use of oral hypoglycemic drug 01/16/2018  . Pharyngeal dysphagia 12/21/2017  . Left thyroid nodule 12/21/2017  . Cigarette smoker 12/21/2017  . History of colon polyps 09/21/2017  . History of atrial  fibrillation 08/23/2017  . GERD (gastroesophageal reflux disease) 08/23/2017  . Presbyopia of both eyes 07/15/2017  . Nuclear sclerotic cataract of both eyes 07/15/2017  . Dry eye syndrome of both lacrimal glands 07/15/2017  . Acquired trigger finger of left ring finger 05/04/2017  . Prolonged Q-T interval on ECG 01/17/2017  . 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 Postural dizziness with presyncope 12/30/2016  . Paroxysmal atrial fibrillation ([place]) 12/30/2016  . Osteoarthritis 12/30/2016  . NSAID long-term use 12/30/2016  . Anemia 12/30/2016  . DDD (degenerative disc disease), lumbar 11/26/2015  . Facet syndrome, lumbar 11/26/2015  . Greater trochanteric bursitis 11/26/2015  . Diabetic neuropathy ([place]) 11/26/2015  . Insomnia 11/11/2015  . PTSD (post-traumatic stress disorder) 11/11/2015  . GAD (generalized anxiety disorder) 11/11/2015  . Severe episode of recurrent major depressive disorder, without psychotic features ([place]) 11/11/2015  . Depression 11/11/2015  . Cough 06/27/2012  . Obesity 06/27/2012  . Tobacco use 06/27/2012  . Sacroiliac joint dysfunction 05/18/2012  . Right low back pain 04/19/2012  . Cervical neck pain with evidence of disc disease 02/11/2012  . Trochanteric bursitis of right hip 02/11/2012  . Plantar fasciitis 03/11/2011  . INSOMNIA 01/18/2008  . Chronic pain syndrome 12/15/2007  . DIABETIC PERIPHERAL NEUROPATHY 05/08/2007  . HEPATITIS C 02/27/2007  . DIABETES MELLITUS, TYPE II 02/27/2007  .  DEPRESSION 02/27/2007  . HYPERTENSION 02/27/2007    Past Surgical History:  Procedure Laterality Date  . ABDOMINAL HYSTERECTOMY     at age 51, unknown reasons  . CARPAL TUNNEL RELEASE  jan 2013   left side  . FOOT SURGERY Bilateral      OB History    Gravida  2   Para  2   Term      Preterm      AB      Living        SAB      TAB      Ectopic      Multiple      Live Births               Home Medications    Prior  to Admission medications   Medication Sig Start Date End Date Taking? Authorizing Provider  ACCU-CHEK FASTCLIX LANCETS MISC 1 each by Does not apply route daily. Check blood sugar once daily. 250.00 10/26/11   [name], DO  albuterol (PROVENTIL HFA;VENTOLIN HFA) 108 (90 Base) MCG/ACT inhaler Inhale 2 puffs into the lungs every 6 (six) hours as needed for wheezing or shortness of breath.     [provider]  atorvastatin (LIPITOR) 20 MG tablet Take 20 mg by mouth daily.    [provider]  Biotin 10 MG CAPS Take 10 mg by mouth daily.    [provider]  buPROPion [facility] SR) 150 MG 12 hr tablet  01/29/19   [provider]  buPROPion (WELLBUTRIN SR) 200 MG 12 hr tablet TK 1 T PO D 02/09/19   [provider]  Calcium Carbonate (CALCARB 600) 1500 MG TABS Take 1 tablet (1,500 mg total) by mouth 2 (two) times daily. 10/25/11   Burman Freestone, MD  celecoxib (CELEBREX) 200 MG capsule Take 200 mg by mouth 2 (two) times daily.     [provider]  cephALEXin (KEFLEX) 500 MG capsule Take 1 capsule (500 mg total) by mouth 3 (three) times daily. 08/30/18   Edrick Kins, DPM  Cholecalciferol (VITAMIN D-1000 MAX ST) 1000 units tablet Take by mouth.    [provider]  clotrimazole-betamethasone (LOTRISONE) cream Apply 1 application topically 2 (two) times daily.    [provider]  Cyanocobalamin (VITAMIN B 12 PO) Take 50 mcg by mouth.    [provider]  cyclobenzaprine (FLEXERIL) 5 MG tablet Take 1 tablet (5 mg total) by mouth at bedtime. 08/26/18   Landis Martins, DPM  DEXILANT 60 MG capsule  12/21/18   [provider]  diltiazem (CARDIZEM CD) 120 MG 24 hr capsule Take by mouth.    [provider]  diltiazem (DILACOR XR) 240 MG 24 hr capsule Take 240 mg by mouth daily.    [provider]  DULoxetine (CYMBALTA) 60 MG capsule TAKE 2 CAPSULE BY MOUTH EVERY DAY 02/01/19   [name], MD   DULoxetine (CYMBALTA) 60 MG capsule  01/03/19   [provider]  ergocalciferol (VITAMIN D2) 50000 UNITS capsule Take 50,000 Units by mouth every Sunday.     [provider]  estrogens, conjugated, (PREMARIN) 0.3 MG tablet Take 0.3 mg by mouth daily. Take daily for 21 days then do not take for 7 days.    [provider]  famotidine (PEPCID) 20 MG tablet  03/20/19   [provider]  ferrous sulfate 325 (65 FE) MG tablet Take 325 mg by mouth daily with breakfast.  [provider]  glucose blood (ACCU-CHEK SMARTVIEW) test strip Check blood sugar once daily. 250.00 10/26/11 10/25/12  [name], DO  guaiFENesin (MUCINEX) 600 MG 12 hr tablet Take by mouth daily.    [provider]  hydroxypropyl methylcellulose / hypromellose (ISOPTO TEARS / GONIOVISC) 2.5 % ophthalmic solution Place 1 drop into both eyes 3 (three) times daily as needed for dry eyes.    [provider]  hydrOXYzine (ATARAX/VISTARIL) 25 MG tablet  12/21/18   [provider]  ibuprofen (ADVIL,MOTRIN) 800 MG tablet TAKE 1 TABLET(800 MG) BY MOUTH EVERY 8 HOURS AS NEEDED FOR MILD PAIN OR MODERATE PAIN 02/09/19   Edrick Kins, DPM  Iron-FA-B Cmp-C-Biot-Probiotic (FUSION PLUS PO) Take 1 tablet by mouth daily. Reported on 03/09/2016    [provider]  LACTOBACILLUS PO Take by mouth.    [provider]  Lidocaine 4 % PTCH Apply 2 patches topically daily.    [provider]  linaclotide (LINZESS) 145 MCG CAPS capsule Take 145 mcg by mouth daily before breakfast.    [provider]  linagliptin (TRADJENTA) 5 MG TABS tablet Take 5 mg by mouth daily.    [provider]  losartan (COZAAR) 50 MG tablet Take 50 mg by mouth daily.    [provider]  lubiprostone (AMITIZA) 24 MCG capsule Take 24 mcg by mouth 2 (two) times daily.    [provider]  metoCLOPramide (REGLAN) 10 MG tablet TK 1 T PO HS 12/19/18    [provider]  mupirocin ointment (BACTROBAN) 2 % Apply topically 3 (three) times daily. 11/22/12   Janell Quiet, MD  naloxone [facility]) nasal spray 4 mg/0.1 mL Place 1 spray into the nose.    [provider]  nystatin (NYSTATIN) powder Apply topically 3 (three) times daily.    [provider]  omeprazole (PRILOSEC) 20 MG capsule Take 1 capsule (20 mg total) by mouth daily. 06/07/12   Bartholomew Crews, MD  Oxycodone HCl 10 MG TABS Take 10 mg by mouth 4 (four) times daily. Reported on 11/26/2015    [provider]  oxyCODONE-acetaminophen (PERCOCET) 5-325 MG tablet Take 1 tablet by mouth every 4 (four) hours as needed for severe pain. 08/24/18   Edrick Kins, DPM  piroxicam (FELDENE) 10 MG capsule TAKE 1 CAPSULE BY MOUTH DAILY 06/06/12   Milta Deiters, MD  potassium chloride (MICRO-K) 10 MEQ CR capsule Take 20 mEq by mouth daily.    [provider]  pregabalin (LYRICA) 200 MG capsule  12/21/18   [provider]  RESTASIS 0.05 % ophthalmic emulsion INT 1 GTT INTO OU BID 02/09/19   [provider]  rivaroxaban (XARELTO) 20 MG TABS tablet Take 20 mg by mouth daily with supper.    [provider]  traZODone (DESYREL) 100 MG tablet Take 2.5 tablets (250 mg total) by mouth at bedtime as needed for sleep. 11/23/18   [name], MD  VOLTAREN 1 % GEL APP 1 GRAM EXT AA D UTD 11/24/16   [provider]  Shirley Friar 5 % SOLN INT 1 GTT INTO OU BID 01/23/19   [provider]    Family History Family History  Problem Relation Age of Onset  . Uterine cancer Mother   . Alcohol abuse Father   . Alcohol abuse Sister   . Drug abuse Sister   . Drug abuse Brother   . Alcohol abuse Brother   . Schizophrenia Son   . Diabetes Other   .  Arthritis Other   . Hypertension Other   . Heart attack Son 46       Died suddenly    Social History Social History   Tobacco Use  . Smoking status: Current Every Day Smoker     Packs/day: 0.50    Years: 35.00    Pack years: 17.50    Types: Cigarettes  . Smokeless tobacco: Never Used  Substance Use Topics  . Alcohol use: No  . Drug use: No    Types: "Crack" cocaine    Comment: last used Oct 2016     Allergies   Hydrocodone-acetaminophen and Hydrocodone   Review of Systems Review of Systems  Constitutional: Negative for chills and fever.  HENT: Negative for ear pain and sore throat.   Eyes: Negative for pain and visual disturbance.  Respiratory: Negative for cough and shortness of breath.   Cardiovascular: Negative for chest pain and palpitations.  Gastrointestinal: Negative for abdominal pain and vomiting.  Genitourinary: Negative for dysuria, frequency and hematuria.  Musculoskeletal: Negative for arthralgias and back pain.  Skin: Negative for color change and rash.  Neurological: Negative for seizures, syncope and headaches.  All other systems reviewed and are negative.    Physical Exam Updated Vital Signs BP 132/70   Pulse 91   Temp 98.4 F (36.9 C) (Oral)   Resp 18   Ht 5\' 11"  (1.803 m)   Wt (!) 138.3 kg   LMP 12/06/1976   SpO2 98%   BMI 42.54 kg/m   Physical Exam Vitals signs and nursing note reviewed.  Constitutional:      General: She is not in acute distress.    Appearance: She is well-developed.  HENT:     Head: Normocephalic and atraumatic.  Eyes:     Conjunctiva/sclera: Conjunctivae normal.  Neck:     Musculoskeletal: Neck supple.  Cardiovascular:     Rate and Rhythm: Normal rate and regular rhythm.     Heart sounds: No murmur.  Pulmonary:     Effort: Pulmonary effort is normal. No respiratory distress.     Breath sounds: Normal breath sounds.  Abdominal:     Palpations: Abdomen is soft.     Tenderness: There is no abdominal tenderness.  Musculoskeletal:        General: Tenderness present.     Right lower leg: Edema (chronic) present.     Left lower leg: Edema (chroinc) present.     Right foot: Normal  capillary refill. Tenderness and laceration present. No crepitus or deformity.       Feet:  Skin:    General: Skin is warm and dry.     Capillary Refill: Capillary refill takes less than 2 seconds.     Findings: No erythema.  Neurological:     Mental Status: She is alert and oriented to person, place, and time.  Psychiatric:        Mood and Affect: Mood normal.      ED Treatments / Results  Labs (all labs ordered are listed, but only abnormal results are displayed) Labs Reviewed - No data to display  EKG None  Radiology No results found.  Procedures Procedures (including critical care time)  Medications Ordered in ED Medications - No data to display   Initial Impression / Assessment and Plan / ED Course  I have reviewed the triage vital signs and the nursing notes.  Pertinent labs & imaging results that were available during my care of the patient were reviewed by me and  considered in my medical decision making (see chart for details).        Sorcha Rotunno is a 58 y.o. female with a past medical history significant for diabetes, hepatitis C, hypertension, depression, and recent procedures from podiatry yesterday who presents for bleeding and pain.  Patient reports that she was told to return the emergency department after her podiatry procedure if she was bleeding through the dressing.  She reports that it did this today.  She also reports her pain has been inadequately controlled at home.  She reports that she has been speaking with both podiatry and her pain control team and is waiting for more pain medication prescription.  She reports that she takes oxycodone 15 mg multiple times a day but needs a new prescription for several doses to get her through the weekend.  She denies any fevers, chills, or other symptoms.  On exam, patient has a soaked through bandage on her right foot.  Left side is not giving her new problems.  Dressing was taken down and the wounds were  examined.  Intricate dressing was not removed from the toes however the large wounds were examined.  There is not appear to be any new bleeding or specifically no pulsatile bleeding.  With lack of new bleeding, wounds were redressed and re-bandaged.  Do not feel there is a complication with her surgery at this time.  We had a shared decision made conversation and as her procedure was done yesterday, we will not do any new labs or imaging at this time.  Suspect postoperative bleeding that has resolved.  Patient will follow-up with her podiatry team this week and she was given a prescription for several doses of pain medicine to help get her to her pain and podiatry team.  They will need to prescribe her more medication going forward.  Patient agreed with this plan of care and was discharged in good condition with understanding of return precautions.   Final Clinical Impressions(s) / ED Diagnoses   Final diagnoses:  Bleeding  Acute post-operative pain    ED Discharge Orders         Ordered    oxyCODONE (ROXICODONE) 15 MG immediate release tablet  Every 4 hours PRN     04/27/19 2300         Clinical Impression: 1. Bleeding   2. Acute post-operative pain     Disposition: Discharge  Condition: Good  I have discussed the results, Dx and Tx plan with the pt(& family if present). He/she/they expressed understanding and agree(s) with the plan. Discharge instructions discussed at great length. Strict return precautions discussed and pt &/or family have verbalized understanding of the instructions. No further questions at time of discharge.    Discharge Medication List as of 04/27/2019 11:17 PM    START taking these medications   Details  !! oxyCODONE (ROXICODONE) 15 MG immediate release tablet Take 1 tablet (15 mg total) by mouth every 4 (four) hours as needed for pain., Starting Fri 04/27/2019, Normal     !! - Potential duplicate medications found. Please discuss with provider.       Follow Up: Edrick Kins, [address] [place] [address] [phone]        Tegeler, Gwenyth Allegra, MD 04/28/19 [phone]

## 2019-04-27 NOTE — Telephone Encounter (Signed)
Faxed orders for wheelchair to [facility] and emailed to [facility]. [place].

## 2019-04-27 NOTE — Addendum Note (Signed)
Addended by: Harriett Sine D on: 04/27/2019 11:02 AM   Modules accepted: Orders

## 2019-04-27 NOTE — Telephone Encounter (Signed)
Pt is in pain, she is requesting pain meds and she also wants a referral sent to [facility] for a wheel chair. Please call patient

## 2019-04-27 NOTE — Discharge Instructions (Signed)
Your examination today did not show active severe bleeding from your postoperative site.  We rewrapped it and dressed it.  We prescribed you a small mount of pain medicine to add to your pain regimen to continue the recommended regimen.  Please follow-up with your foot surgeon and your pain team for further management.  If any symptoms change or worsen, please return to the nearest emergency department.

## 2019-04-27 NOTE — ED Triage Notes (Signed)
Pt brought in from home by EMS for right foot post op bleeding

## 2019-04-29 ENCOUNTER — Telehealth: Payer: Self-pay | Admitting: Sports Medicine

## 2019-04-29 NOTE — Telephone Encounter (Signed)
Patient called stating that she had to walk on her foot to let a relative in her home and noticed more bleeding through her dressing. Reports on Friday she went to [facility] and a doctor there changed it but now its bleeding again because she has to walk on it even though Dr. Rebekah Chesterfield told her not to. I advised patient to stay off her foot as instructed and to continue with ice and elevation. I advised patient to re-inforce the dressing with another guaze and ace wrap. I recommended for her to call office if bleeding continues or worsens. -Dr. Cannon Kettle

## 2019-05-01 ENCOUNTER — Telehealth: Payer: Self-pay | Admitting: *Deleted

## 2019-05-01 NOTE — Telephone Encounter (Signed)
Due to the [facility] Day holiday our office was closed. I called pt and she states she quit icing the foot and it has stopped bleeding and she has an appt tomorrow. I told pt to rest as much as possible to allow the incision site to heal and call again if she had any questions.

## 2019-05-01 NOTE — Telephone Encounter (Signed)
Pt called Friday after hours stating her surgery foot was bleeding. Pt called Saturday morning stating she had been to [facility] ED in [facility], the staff had changed the dressing except what was over the surgery site, and pt stated she was still having some bleeding.

## 2019-05-02 ENCOUNTER — Encounter: Payer: Self-pay | Admitting: Podiatry

## 2019-05-02 ENCOUNTER — Ambulatory Visit (INDEPENDENT_AMBULATORY_CARE_PROVIDER_SITE_OTHER): Payer: Medicare Other

## 2019-05-02 ENCOUNTER — Other Ambulatory Visit: Payer: Self-pay

## 2019-05-02 ENCOUNTER — Ambulatory Visit (INDEPENDENT_AMBULATORY_CARE_PROVIDER_SITE_OTHER): Payer: Medicare Other | Admitting: Podiatry

## 2019-05-02 VITALS — Temp 97.4°F

## 2019-05-02 DIAGNOSIS — Z9889 Other specified postprocedural states: Secondary | ICD-10-CM

## 2019-05-02 DIAGNOSIS — M779 Enthesopathy, unspecified: Secondary | ICD-10-CM

## 2019-05-02 DIAGNOSIS — M778 Other enthesopathies, not elsewhere classified: Secondary | ICD-10-CM

## 2019-05-02 DIAGNOSIS — M898X9 Other specified disorders of bone, unspecified site: Secondary | ICD-10-CM

## 2019-05-02 MED ORDER — DOXYCYCLINE HYCLATE 100 MG PO TABS: 100.0000 mg | ORAL_TABLET | Freq: Two times a day (BID) | ORAL | 0 refills | Status: DC

## 2019-05-07 ENCOUNTER — Ambulatory Visit (INDEPENDENT_AMBULATORY_CARE_PROVIDER_SITE_OTHER): Payer: Medicare Other | Admitting: Podiatry

## 2019-05-07 ENCOUNTER — Encounter: Payer: Self-pay | Admitting: Podiatry

## 2019-05-07 ENCOUNTER — Other Ambulatory Visit: Payer: Self-pay

## 2019-05-07 VITALS — Temp 97.3°F

## 2019-05-07 DIAGNOSIS — Z9889 Other specified postprocedural states: Secondary | ICD-10-CM

## 2019-05-07 NOTE — Progress Notes (Addendum)
   Subjective:  Patient presents today status post bilateral foot surgery. DOS: 04/26/2019.  Patient underwent hallux IPJ arthrodesis with bilateral exostectomy and bunionectomy to the right foot with hammertoe repair 2-5.  Patient states that her feet feel warm and throb.  She admits to being up on her feet a lot and doing a lot of walking, despite instructions to be minimal weightbearing and resting after surgery.  Patient also has not been wearing her postoperative shoes as instructed.  Past Medical History:  Diagnosis Date  . Arthritis   . Cervicalgia   . Chondromalacia of patella   . Chronic neck pain   . Chronic pain syndrome   . Depression    secondary to loss of her son at age 35  . Diabetes mellitus   . Diabetic neuropathy ([place])   . DMII (diabetes mellitus, type 2) ([place])   . Enthesopathy of hip region   . Fibromyalgia   . Hep C w/o coma, chronic ([place])   . Hepatitis C    diagnosed 2005  . Hypertension   . Insomnia   . Insomnia   . Low back pain   . Lumbago   . Obesity   . Pain in joint, upper arm   . Primary localized osteoarthrosis, lower leg   . Substance abuse ([place])    sober since 2001  . Tobacco abuse   . 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  Skin incisions have completely dehisced secondary to the excessive ambulation.  Heavy edema noted to the bilateral feet.  No significant sign of infection at the moment.  Significant amount of serous drainage noted to the bilateral feet incision sites.  Radiographic Exam:  Orthopedic hardware and osteotomies sites appear to be stable despite the excessive ambulation  Assessment: 1. s/p bilateral foot surgery. DOS: 04/26/2019 2.  Dehiscence to incision sites bilateral   Plan of Care:  1. Patient was evaluated. X-rays reviewed 2.  Today dressings were changed.  Instructions were provided to keep the dressings clean dry and intact x1 week 3.  Recommend the  patient be wheelchair-bound and not ambulatory to the bilateral feet 4.  Patient states that she needs a evening home health aide.  Our office will attempt to get the patient approved for home health aide.  This is medically necessary 5.  Patient is unable to use a walker, cane or crutch and she does have the ability to self propel a wheelchair.   6.  Return to clinic in 1 week for dressing change and reevaluation  Edrick Kins, DPM [facility]  Dr. Edrick Kins, [address]                                        [place], [place] [address]                Office [phone]  Fax [phone]

## 2019-05-07 NOTE — Telephone Encounter (Signed)
-----   Message from Edrick Kins, DPM sent at 05/07/2019  3:13 PM EDT ----- Regarding: Wheelchair and Home Health Aid Patient had surgery on 04/26/2019. Incisions have broken open and a mess.   Patient needs wheelchair. Last time she got it through [place].   Patient also needs home health aid. Last time she got it through [employer].   Dx: S/p bilateral foot surgery. DOS: 04/26/2019. NWB b/l lower extremities.   Thanks, Dr. Amalia Hailey

## 2019-05-08 ENCOUNTER — Telehealth: Payer: Self-pay | Admitting: Podiatry

## 2019-05-08 ENCOUNTER — Telehealth: Payer: Self-pay | Admitting: *Deleted

## 2019-05-08 DIAGNOSIS — Z9889 Other specified postprocedural states: Secondary | ICD-10-CM

## 2019-05-08 NOTE — Telephone Encounter (Signed)
-----   Message from Edrick Kins, DPM sent at 05/07/2019  3:13 PM EDT ----- Regarding: Wheelchair and Home Health Aid Patient had surgery on 04/26/2019. Incisions have broken open and a mess.   Patient needs wheelchair. Last time she got it through [place].   Patient also needs home health aid. Last time she got it through [employer].   Dx: S/p bilateral foot surgery. DOS: 04/26/2019. NWB b/l lower extremities.   Thanks, Dr. Amalia Hailey

## 2019-05-08 NOTE — Telephone Encounter (Signed)
ATamala Julian - Scheduler informed pt the wheelchair had been ordered with [facility] and I was waiting for [facility] to send referral form.

## 2019-05-08 NOTE — Telephone Encounter (Signed)
05/07/2019 orders processed in 05/08/2019 Telephone Call.

## 2019-05-08 NOTE — Telephone Encounter (Signed)
Patient wants a wheelchair, she spoke with [employer] and they are waiting on paperwork from [place].

## 2019-05-08 NOTE — Telephone Encounter (Signed)
Refaxed the [facility] DME form required with the Dr. Amalia Hailey notes and pt demographics.

## 2019-05-08 NOTE — Telephone Encounter (Signed)
Left message informing [facility] - Sonya I had not received the referral form from their office.

## 2019-05-08 NOTE — Telephone Encounter (Signed)
I called [place] and informed that Dr. Amalia Hailey is ordering [facility] aide for pt and I needed a copy of their referral form. Davy Pique states she will have Ms Jimmye Norman fax to our office.

## 2019-05-08 NOTE — Telephone Encounter (Signed)
Faxed orders with 12 month length of need, last completed clinical 05/02/2019 with order for the wheelchair and required [facility] Wheelchair order form to [place].

## 2019-05-08 NOTE — Telephone Encounter (Signed)
Faxed the forms [id number] from [facility] [employer] to [employer] and to [employer].

## 2019-05-09 ENCOUNTER — Telehealth: Payer: Self-pay | Admitting: Podiatry

## 2019-05-09 DIAGNOSIS — M898X9 Other specified disorders of bone, unspecified site: Secondary | ICD-10-CM

## 2019-05-09 DIAGNOSIS — M778 Other enthesopathies, not elsewhere classified: Secondary | ICD-10-CM

## 2019-05-09 DIAGNOSIS — Z9889 Other specified postprocedural states: Secondary | ICD-10-CM

## 2019-05-09 NOTE — Progress Notes (Signed)
   Subjective:  Patient presents today status post bilateral foot surgery. DOS: 04/26/2019.  Patient underwent hallux IPJ arthrodesis with bilateral exostectomy and bunionectomy to the right foot with hammertoe repair 2-5.  Patient continues to state that she has been ambulatory and walking on her foot excessively.  She does not have any home health aide in the evenings to help her.  She is kept the dressings clean dry and intact wearing her surgical shoes bilaterally.  She continues to have some bleeding to the incision sites continues to experience pain.  Past Medical History:  Diagnosis Date  . Arthritis   . Cervicalgia   . Chondromalacia of patella   . Chronic neck pain   . Chronic pain syndrome   . Depression    secondary to loss of her son at age 37  . Diabetes mellitus   . Diabetic neuropathy ([place])   . DMII (diabetes mellitus, type 2) ([place])   . Enthesopathy of hip region   . Fibromyalgia   . Hep C w/o coma, chronic ([place])   . Hepatitis C    diagnosed 2005  . Hypertension   . Insomnia   . Insomnia   . Low back pain   . Lumbago   . Obesity   . Pain in joint, upper arm   . Primary localized osteoarthrosis, lower leg   . Substance abuse ([place])    sober since 2001  . Tobacco abuse   . 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  Skin incisions have completely dehisced secondary to the excessive ambulation.  Heavy edema noted to the bilateral feet however there is some improvement since prior visit.  No significant sign of infection at the moment, although she is currently on antibiotics prescribed last visit more for prophylaxis.  Significant amount of serous drainage noted to the bilateral feet incision sites.  No malodor noted.  Assessment: 1. s/p bilateral foot surgery. DOS: 04/26/2019 2.  Dehiscence to incision sites bilateral   Plan of Care:  1. Patient was evaluated.  2.  Today dressings were changed.   Instructions were provided to keep the dressings clean dry and intact x1 week 3.  Continue oral antibiotics as prescribed last visit 4.  Patient is waiting to get approval for a wheelchair and home health aide for the evenings.  This will be medically necessary.  The patient needs to be nonweightbearing to the bilateral lower extremities.  Ambulation on her feet is detrimental to the surgery and postoperative recovery, which can lead to revisional surgery and potential complications.  Patient will need this for potentially 6 months. 5.  Until we can get the patient a wheelchair continue minimal weightbearing, preferably nonweightbearing however this is not reasonable until she can get a wheelchair. 6.  Return to clinic in 1 week  Edrick Kins, DPM [facility]  Dr. Edrick Kins, [address]                                        [place], [place] [address]                Office [phone]  Fax [phone]

## 2019-05-09 NOTE — Telephone Encounter (Signed)
Pt states [facility] states they are waiting for more information from our office and to call them.

## 2019-05-09 NOTE — Telephone Encounter (Signed)
Pt called and stated that [facility] is requesting more information from our office before they can deliver her the wheel chair. Please call patient

## 2019-05-09 NOTE — Telephone Encounter (Signed)
Left message for M. Stenson - [facility] to call if more information was needed.

## 2019-05-10 NOTE — Addendum Note (Signed)
Addended by: Harriett Sine D on: 05/10/2019 10:03 AM   Modules accepted: Orders

## 2019-05-10 NOTE — Telephone Encounter (Signed)
Faxed to [place], M. Stenson and emailed to [employer] Stenson for wheelchair.

## 2019-05-14 ENCOUNTER — Ambulatory Visit (INDEPENDENT_AMBULATORY_CARE_PROVIDER_SITE_OTHER): Payer: Self-pay | Admitting: Podiatry

## 2019-05-14 ENCOUNTER — Telehealth: Payer: Self-pay | Admitting: Podiatry

## 2019-05-14 ENCOUNTER — Other Ambulatory Visit: Payer: Self-pay

## 2019-05-14 VITALS — Temp 97.7°F

## 2019-05-14 DIAGNOSIS — M779 Enthesopathy, unspecified: Secondary | ICD-10-CM

## 2019-05-14 DIAGNOSIS — M778 Other enthesopathies, not elsewhere classified: Secondary | ICD-10-CM

## 2019-05-14 DIAGNOSIS — Z9889 Other specified postprocedural states: Secondary | ICD-10-CM

## 2019-05-14 NOTE — Telephone Encounter (Signed)
Addendum completed. - Dr. Amalia Hailey

## 2019-05-14 NOTE — Telephone Encounter (Signed)
Pt stated she saw Dr. Amalia Hailey' this morning and he is supposed to be writing another Rx for another wheelchair. Pt called to speak about that.

## 2019-05-15 NOTE — Telephone Encounter (Signed)
I called pt and informed that our office had faxed the required form with the heavy duty wheelchair, cushion, rear anti-tippers and brake extenders with Dr. Amalia Hailey up dated clinicals to [place]. I apologized to pt for the delay, and explained there were new steps to the ordering procedure our office was having to get use to.

## 2019-05-15 NOTE — Telephone Encounter (Signed)
Emailed required form, clinicals with Dr. Amalia Hailey updated statement of necessity, length of time needed and wheelchair with cushion, brake extensions, and rear anti-tippers and emailed to M. Stenson.

## 2019-05-17 NOTE — Progress Notes (Signed)
   Subjective:  Patient presents today status post bilateral foot surgery. DOS: 04/26/2019. Patient underwent hallux IPJ arthrodesis with bilateral exostectomy and bunionectomy to the right foot with hammertoe repair 2-5. She reports an increase in soreness of the bilateral feet but admits to being on them more due to multiple doctor appointments last week. She states she has been taking the antibiotics as directed. There are no other modifying factors noted. Patient is here for further evaluation and treatment.   Past Medical History:  Diagnosis Date  . Arthritis   . Cervicalgia   . Chondromalacia of patella   . Chronic neck pain   . Chronic pain syndrome   . Depression    secondary to loss of her son at age 89  . Diabetes mellitus   . Diabetic neuropathy ([place])   . DMII (diabetes mellitus, type 2) ([place])   . Enthesopathy of hip region   . Fibromyalgia   . Hep C w/o coma, chronic ([place])   . Hepatitis C    diagnosed 2005  . Hypertension   . Insomnia   . Insomnia   . Low back pain   . Lumbago   . Obesity   . Pain in joint, upper arm   . Primary localized osteoarthrosis, lower leg   . 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 Tobacco abuse   . 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  Skin incisions have completely dehisced secondary to the excessive ambulation.  Heavy edema noted to the bilateral feet however there is some improvement since prior visit.  No significant sign of infection at the moment, although she is currently on antibiotics prescribed last visit more for prophylaxis.  Significant amount of serous drainage noted to the bilateral feet incision sites.  No malodor noted.  Assessment: 1. s/p bilateral foot surgery. DOS: 04/26/2019 2.  Dehiscence to incision sites bilateral   Plan of Care:  1. Patient was evaluated.  2. Sutures removed. Staples left intact. Dry sterile dressing applied.  3.  Wheelchair still pending.  4. Continue minimal weightbearing bilaterally until wheelchair is approved.  5. Return to clinic in one week for staple removal.   Edrick Kins, DPM [facility]  Dr. Edrick Kins, [address]                                        [place], [place] [address]                Office [phone]  Fax [phone]

## 2019-05-23 ENCOUNTER — Ambulatory Visit (INDEPENDENT_AMBULATORY_CARE_PROVIDER_SITE_OTHER): Payer: Self-pay | Admitting: Podiatry

## 2019-05-23 ENCOUNTER — Telehealth: Payer: Self-pay | Admitting: *Deleted

## 2019-05-23 ENCOUNTER — Ambulatory Visit (INDEPENDENT_AMBULATORY_CARE_PROVIDER_SITE_OTHER): Payer: Medicare Other

## 2019-05-23 ENCOUNTER — Other Ambulatory Visit: Payer: Self-pay

## 2019-05-23 DIAGNOSIS — M898X9 Other specified disorders of bone, unspecified site: Secondary | ICD-10-CM

## 2019-05-23 DIAGNOSIS — M778 Other enthesopathies, not elsewhere classified: Secondary | ICD-10-CM

## 2019-05-23 DIAGNOSIS — Z9889 Other specified postprocedural states: Secondary | ICD-10-CM

## 2019-05-23 DIAGNOSIS — M779 Enthesopathy, unspecified: Secondary | ICD-10-CM | POA: Diagnosis not present

## 2019-05-23 NOTE — Telephone Encounter (Signed)
-----   Message from Edrick Kins, DPM sent at 05/23/2019  9:07 AM EDT ----- Regarding: wound dressing changes Please order Home health dressing changes to right foot wound. 2.0 x 1.0 x 0.2 cm.  3x/week x 6 weeks.   - cleanse with NS - apply prisma, aquacel ag, and DSD  Thanks, Dr. Amalia Hailey

## 2019-05-23 NOTE — Telephone Encounter (Signed)
Orders faxed to [facility].  °

## 2019-05-23 NOTE — Telephone Encounter (Signed)
Faxed copy of Dr. Amalia Hailey wound care orders of 05/23/2019 9:07am to [place].

## 2019-05-26 NOTE — Progress Notes (Signed)
   Subjective:  Patient presents today status post bilateral foot surgery. DOS: 04/26/2019. Patient underwent hallux IPJ arthrodesis with bilateral exostectomy and bunionectomy to the right foot with hammertoe repair 2-5. She reports an increase in soreness of the bilateral feet but admits to being on them more due to multiple doctor appointments last week. She states she has been taking the antibiotics as directed. There are no other modifying factors noted. Patient is here for further evaluation and treatment.   Past Medical History:  Diagnosis Date  . Arthritis   . Cervicalgia   . Chondromalacia of patella   . Chronic neck pain   . Chronic pain syndrome   . Depression    secondary to loss of her son at age 18  . Diabetes mellitus   . Diabetic neuropathy ([place])   . DMII (diabetes mellitus, type 2) ([place])   . Enthesopathy of hip region   . Fibromyalgia   . Hep C w/o coma, chronic ([place])   . Hepatitis C    diagnosed 2005  . Hypertension   . Insomnia   . Insomnia   . Low back pain   . Lumbago   . Obesity   . Pain in joint, upper arm   . Primary localized osteoarthrosis, lower leg   . 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 Tobacco abuse   . 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 Right foot wound dehiscence measuring 2.0 x 1.0 x 0.2 cm. Heavy edema noted to the bilateral feet however there is some improvement since prior visit. No significant sign of infection at the moment, although she is currently on antibiotics prescribed last visit more for prophylaxis.  Significant amount of serous drainage noted to the bilateral feet incision sites.  No malodor noted.  Radiographic Exam:  Orthopedic hardware and osteotomies sites appear to be stable with routine healing.  Assessment: 1. s/p bilateral foot surgery. DOS: 04/26/2019 2. Right foot wound dehiscence    Plan of Care:  1. Patient was evaluated. X-Rays  reviewed.   2. Staples removed.  3. Orders placed for home health wound dressing changes. Collagen and Aquacel with dry sterile dressing three times weekly.  4. Wheelchair still pending.  5. Continue minimal weightbearing bilateral until wheelchair is approved.  6. Return to clinic in 3 weeks.   Edrick Kins, DPM [facility]  Dr. Edrick Kins, [address]                                        [place], [place] [address]                Office [phone]  Fax [phone]

## 2019-05-29 ENCOUNTER — Telehealth: Payer: Self-pay | Admitting: Podiatry

## 2019-05-29 NOTE — Telephone Encounter (Addendum)
I called pt and informed I had sent the [facility] orders to [employer]. Pt states the aide has been coming out but not the nurse. I asked pt if the [facility] had nursing care and she stated they did. Pt states she had another company when she had the 1st surgery. I reviewed clinicals and pt was established with [facility]. Faxed required form, clinicals, orders and demographics to [place].

## 2019-05-29 NOTE — Telephone Encounter (Signed)
Patient called and asked about her home care nurse order for dressing changes. She has not heard anything. Please call when possible.

## 2019-06-06 ENCOUNTER — Ambulatory Visit (INDEPENDENT_AMBULATORY_CARE_PROVIDER_SITE_OTHER): Payer: Medicare Other | Admitting: Podiatry

## 2019-06-06 ENCOUNTER — Other Ambulatory Visit: Payer: Self-pay

## 2019-06-06 DIAGNOSIS — Z9889 Other specified postprocedural states: Secondary | ICD-10-CM

## 2019-06-06 DIAGNOSIS — B351 Tinea unguium: Secondary | ICD-10-CM | POA: Diagnosis not present

## 2019-06-06 DIAGNOSIS — M79676 Pain in unspecified toe(s): Secondary | ICD-10-CM

## 2019-06-13 NOTE — Progress Notes (Signed)
   Subjective:  Patient presents today status post bilateral foot surgery. DOS: 04/26/2019. Patient underwent hallux IPJ arthrodesis with bilateral exostectomy and bunionectomy to the right foot with hammertoe repair 2-5. She states she is doing well. She denies any significant pain or modifying factors.  She reports elongated, thickened nail that cause pain while ambulating in shoes. She is unable to trim her own nails. Patient is here for further evaluation and treatment.   Past Medical History:  Diagnosis Date  . Arthritis   . Cervicalgia   . Chondromalacia of patella   . Chronic neck pain   . Chronic pain syndrome   . Depression    secondary to loss of her son at age 49  . Diabetes mellitus   . Diabetic neuropathy ([place])   . DMII (diabetes mellitus, type 2) ([place])   . Enthesopathy of hip region   . Fibromyalgia   . Hep C w/o coma, chronic ([place])   . Hepatitis C    diagnosed 2005  . Hypertension   . Insomnia   . Insomnia   . Low back pain   . Lumbago   . Obesity   . Pain in joint, upper arm   . Primary localized osteoarthrosis, lower leg   . 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 Tobacco abuse   . 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  Skin incisions appear to be well coapted. No sign of infectious process noted. Dehiscence healed. No active bleeding noted. Moderate edema noted to the surgical extremity. Nails are tender, long, thickened and dystrophic with subungual debris, consistent with onychomycosis, 1-5 bilateral. No signs of infection noted.   Assessment: 1. s/p bilateral foot surgery. DOS: 04/26/2019 2. Right foot wound dehiscence - healed  3. Onychodystrophic nails 1-5 bilateral with hyperkeratosis of nails.  4. Onychomycosis of nail due to dermatophyte bilateral   Plan of Care:  1. Patient was evaluated.    2. Mechanical debridement of nails 1-5 bilaterally performed using a nail nipper.  Filed with dremel without incident.  3. Begin daily foot baths.  4. Continue weightbearing in post op shoes bilaterally.  5. Return to clinic in 4 weeks.   Edrick Kins, DPM [facility]  Dr. Edrick Kins, [address]                                        [place], [place] [address]                Office [phone]  Fax [phone]

## 2019-07-03 ENCOUNTER — Telehealth: Payer: Self-pay | Admitting: Podiatry

## 2019-07-03 ENCOUNTER — Telehealth: Payer: Self-pay | Admitting: *Deleted

## 2019-07-03 NOTE — Telephone Encounter (Signed)
Faxed completed forms 1 and 2 to [place].

## 2019-07-03 NOTE — Telephone Encounter (Signed)
Pt presents to office with form to be completed to continue [place] aide at current scheduling, with noting on form DOS 08/24/2018 and 04/26/2019, and that she is continuing surgical foot gear and needs assist with ADL. Form completed and faxed to [employer], copy sent to scan and original given to pt.

## 2019-07-03 NOTE — Telephone Encounter (Signed)
[facility] - [place] [facility] states the form faxed to her office is encorrect to call for more information.

## 2019-07-03 NOTE — Telephone Encounter (Signed)
Pt states CNA with home health agency is about to cut hours if a form is not filled out and signed today. States they need the dates of both surgeries. The first surgery is when she wore the boot and the second surgery is when she wore two shoes. Asked pt to fax [place] the form and pt stated if she cannot get it faxed she is coming to the office.

## 2019-07-03 NOTE — Telephone Encounter (Signed)
I called [facility] and she states she will fax the proper forms with instruction to me.

## 2019-07-03 NOTE — Telephone Encounter (Signed)
Pt presented to office and requested form completed and faxed, see additional note of 07/03/2019.

## 2019-07-10 ENCOUNTER — Telehealth: Payer: Self-pay | Admitting: *Deleted

## 2019-07-10 NOTE — Telephone Encounter (Signed)
[place] [employer] [place] - [name] states pt's DOB was incorrect on last form for Medical assistance, please complete the form if pt is to continue [place].

## 2019-07-11 ENCOUNTER — Ambulatory Visit (INDEPENDENT_AMBULATORY_CARE_PROVIDER_SITE_OTHER): Payer: Self-pay | Admitting: Podiatry

## 2019-07-11 ENCOUNTER — Other Ambulatory Visit: Payer: Self-pay

## 2019-07-11 ENCOUNTER — Encounter: Payer: Self-pay | Admitting: Podiatry

## 2019-07-11 VITALS — Temp 97.3°F

## 2019-07-11 DIAGNOSIS — Z9889 Other specified postprocedural states: Secondary | ICD-10-CM

## 2019-07-11 DIAGNOSIS — M2041 Other hammer toe(s) (acquired), right foot: Secondary | ICD-10-CM

## 2019-07-11 DIAGNOSIS — M898X9 Other specified disorders of bone, unspecified site: Secondary | ICD-10-CM

## 2019-07-11 NOTE — Telephone Encounter (Signed)
Yes, patient is able to perform ADL without assistance now. - Dr. Amalia Hailey

## 2019-07-11 NOTE — Telephone Encounter (Signed)
Form completed with proper DOB faxed to [facility].

## 2019-07-14 NOTE — Progress Notes (Signed)
   Subjective:  Patient presents today status post bilateral foot surgery. DOS: 04/26/2019. She states she is experiencing some continued pain in the bilateral arches. She reports some pain to the dorsum of the right foot. She has been using the post op shoes as directed and denies any known modifying factors. Patient is here for further evaluation and treatment.    Past Medical History:  Diagnosis Date  . Arthritis   . Cervicalgia   . Chondromalacia of patella   . Chronic neck pain   . Chronic pain syndrome   . Depression    secondary to loss of her son at age 53  . Diabetes mellitus   . Diabetic neuropathy ([place])   . DMII (diabetes mellitus, type 2) ([place])   . Enthesopathy of hip region   . Fibromyalgia   . Hep C w/o coma, chronic ([place])   . Hepatitis C    diagnosed 2005  . Hypertension   . Insomnia   . Insomnia   . Low back pain   . Lumbago   . Obesity   . Pain in joint, upper arm   . Primary localized osteoarthrosis, lower leg   . 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 Tobacco abuse   . 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  Skin incisions appear to be well coapted. No sign of infectious process noted. No dehiscence. No active bleeding noted. Moderate edema noted to the surgical extremity. Superficial skin breakdown of the right dorsal foot noted.   Assessment: 1. s/p bilateral foot surgery. DOS: 04/26/2019 2. Superficial skin breakdown right dorsal foot  Plan of Care:  1. Patient was evaluated. 2. Discontinue using post op shoes bilaterally.  3. Begin weightbearing in good sneakers.  4. Recommended Silvadene cream daily to the right foot.  5. Return to clinic in 6 weeks for final follow up X-Ray.    Edrick Kins, DPM [facility]  Dr. Edrick Kins, [address]                                        [place], [place] [address]                Office [phone]   Fax [phone]

## 2019-07-17 ENCOUNTER — Ambulatory Visit: Payer: Medicare Other | Admitting: Orthotics

## 2019-07-17 ENCOUNTER — Telehealth: Payer: Self-pay | Admitting: Podiatry

## 2019-07-17 ENCOUNTER — Other Ambulatory Visit: Payer: Self-pay

## 2019-07-17 DIAGNOSIS — M2012 Hallux valgus (acquired), left foot: Secondary | ICD-10-CM

## 2019-07-17 DIAGNOSIS — M205X2 Other deformities of toe(s) (acquired), left foot: Secondary | ICD-10-CM

## 2019-07-17 DIAGNOSIS — E0843 Diabetes mellitus due to underlying condition with diabetic autonomic (poly)neuropathy: Secondary | ICD-10-CM

## 2019-07-17 DIAGNOSIS — M2041 Other hammer toe(s) (acquired), right foot: Secondary | ICD-10-CM

## 2019-07-17 MED ORDER — SILVER SULFADIAZINE 1 % EX CREA: 1.0000 "application " | TOPICAL_CREAM | Freq: Every day | CUTANEOUS | 3 refills | Status: DC

## 2019-07-17 NOTE — Telephone Encounter (Signed)
I informed pt the silvadene was sent to the [facility] in [facility] on [address]. Pt stated [facility] [address], but would pick it up at the [facility] this time.

## 2019-07-17 NOTE — Telephone Encounter (Signed)
Pt is requesting a prescription for Silvadene Cream 1 %. Dr. Amalia Hailey gave pt a sample. [facility] [place]

## 2019-07-17 NOTE — Progress Notes (Signed)

## 2019-07-17 NOTE — Addendum Note (Signed)
Addended by: Harriett Sine D on: 07/17/2019 04:17 PM   Modules accepted: Orders

## 2019-08-17 DIAGNOSIS — E7800 Pure hypercholesterolemia, unspecified: Secondary | ICD-10-CM | POA: Insufficient documentation

## 2019-08-22 ENCOUNTER — Ambulatory Visit (INDEPENDENT_AMBULATORY_CARE_PROVIDER_SITE_OTHER): Payer: Medicare Other | Admitting: Podiatry

## 2019-08-22 ENCOUNTER — Other Ambulatory Visit: Payer: Self-pay

## 2019-08-22 ENCOUNTER — Ambulatory Visit (INDEPENDENT_AMBULATORY_CARE_PROVIDER_SITE_OTHER): Payer: Medicare Other

## 2019-08-22 ENCOUNTER — Other Ambulatory Visit: Payer: Self-pay | Admitting: Podiatry

## 2019-08-22 DIAGNOSIS — Z9889 Other specified postprocedural states: Secondary | ICD-10-CM

## 2019-08-22 DIAGNOSIS — T8484XA Pain due to internal orthopedic prosthetic devices, implants and grafts, initial encounter: Secondary | ICD-10-CM

## 2019-08-22 DIAGNOSIS — M779 Enthesopathy, unspecified: Secondary | ICD-10-CM

## 2019-08-22 NOTE — Progress Notes (Signed)
Subjective:  Patient presents today status post bilateral foot surgery. DOS: 04/26/2019.  Patient states that overall she is doing well however she still has some pain and tenderness.  She does admit to early weightbearing excessively immediately after surgery because she says that she did not get a wheelchair approved through her insurance.  She has had a rough postoperative management because of the increased ambulation against my advice.  However she is improved and feeling much better.  No new complaints at this time  Past Medical History:  Diagnosis Date  . Arthritis   . Cervicalgia   . Chondromalacia of patella   . Chronic neck pain   . Chronic pain syndrome   . Depression    secondary to loss of her son at age 85  . Diabetes mellitus   . Diabetic neuropathy ([place])   . DMII (diabetes mellitus, type 2) ([place])   . Enthesopathy of hip region   . Fibromyalgia   . Hep C w/o coma, chronic ([place])   . Hepatitis C    diagnosed 2005  . Hypertension   . Insomnia   . Insomnia   . Low back pain   . Lumbago   . Obesity   . Pain in joint, upper arm   . Primary localized osteoarthrosis, lower leg   . Substance abuse ([place])    sober since 2001  . Tobacco abuse   . 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  Skin incisions appear to be well coapted. No sign of infectious process noted. No dehiscence. No active bleeding noted. Moderate edema noted to the surgical extremity. Pain also noted to the distal tufts of the digits 2,3,4.  The pain is likely coming from the heads that the orthopedic screws used for the hammertoe repair.  The screws appear to be intact however it is very hard and nodular at the distal tuft of the toes.  The screws likely need to come out.   Radiographic exam There is some displacement of the capital fragment of the first metatarsal however the screws appear to be intact although they have moved since surgery.   Dorsal displacement of the capital fragment noted.  Screws appear intact to the hammertoes of the 2, 3, 4 right foot.  There is associated clinical pain with palpation of the heads of the orthopedic screws  Assessment: 1. s/p bilateral foot surgery. DOS: 04/26/2019 2.  Symptomatic orthopedic hardware right foot 2, 3, 4  Plan of Care:  1. Patient was evaluated. 2.  The patient does have some pain and tenderness to the distal tufts of the toes 2, 3, 4 I do believe that the screws need to come out.  The patient would like to be under sedation at that time so we can have to do the surgery at the surgery center instead of here in the office.  Under anesthesia.  All possible complications and details the procedure were explained.  No guarantees were expressed or implied.  All patient questions answered. 3.  Authorization for surgery initiated today.  Surgery will consist of removal of hardware digits 2, 3, 4 right foot 4.  Patient has an appointment for Friday, 08/24/2019, to receive her diabetic shoes with insoles from our Pedorthist 5.  Return to clinic 1 week postop   Edrick Kins, DPM [facility]  Dr. Edrick Kins, [address]  [place], [place] [address]                Office [phone]  Fax [phone]

## 2019-08-22 NOTE — Patient Instructions (Addendum)
Pre-Operative Instructions  Congratulations, you have decided to take an important step towards improving your quality of life.  You can be assured that the doctors and staff at [facility] will be with you every step of the way.  Here are some important things you should know:  1. Plan to be at the surgery center/hospital at least 1 (one) hour prior to your scheduled time, unless otherwise directed by the surgical center/hospital staff.  You must have a responsible adult accompany you, remain during the surgery and drive you home.  Make sure you have directions to the surgical center/hospital to ensure you arrive on time. 2. If you are having surgery at [facility] or [facility], you will need a copy of your medical history and physical form from your family physician within one month prior to the date of surgery. We will give you a form for your primary physician to complete.  3. We make every effort to accommodate the date you request for surgery.  However, there are times where surgery dates or times have to be moved.  We will contact you as soon as possible if a change in schedule is required.   4. No aspirin/ibuprofen for one week before surgery.  If you are on aspirin, any non-steroidal anti-inflammatory medications (Mobic, Aleve, Ibuprofen) should not be taken seven (7) days prior to your surgery.  You make take Tylenol for pain prior to surgery.  5. Medications - If you are taking daily heart and blood pressure medications, seizure, reflux, allergy, asthma, anxiety, pain or diabetes medications, make sure you notify the surgery center/hospital before the day of surgery so they can tell you which medications you should take or avoid the day of surgery. 6. No food or drink after midnight the night before surgery unless directed otherwise by surgical center/hospital staff. 7. No alcoholic beverages 24-hours prior to surgery.  No smoking 24-hours prior or 24-hours after  surgery. 8. Wear loose pants or shorts. They should be loose enough to fit over bandages, boots, and casts. 9. Don't wear slip-on shoes. Sneakers are preferred. 10. Bring your boot with you to the surgery center/hospital.  Also bring crutches or a walker if your physician has prescribed it for you.  If you do not have this equipment, it will be provided for you after surgery. 11. If you have not been contacted by the surgery center/hospital by the day before your surgery, call to confirm the date and time of your surgery. 12. Leave-time from work may vary depending on the type of surgery you have.  Appropriate arrangements should be made prior to surgery with your employer. 13. Prescriptions will be provided immediately following surgery by your doctor.  Fill these as soon as possible after surgery and take the medication as directed. Pain medications will not be refilled on weekends and must be approved by the doctor. 14. Remove nail polish on the operative foot and avoid getting pedicures prior to surgery. 15. Wash the night before surgery.  The night before surgery wash the foot and leg well with water and the antibacterial soap provided. Be sure to pay special attention to beneath the toenails and in between the toes.  Wash for at least three (3) minutes. Rinse thoroughly with water and dry well with a towel.  Perform this wash unless told not to do so by your physician.  Enclosed: 1 Ice pack (please put in freezer the night before surgery)   1 Hibiclens skin cleaner     Pre-op instructions  If you have any questions regarding the instructions, please do not hesitate to call our office.  [place]: [address], [place], [place] [address] -- [phone]  [place]: [address] [place], [place] [address] -- [phone]  [place]: [address]  [place], [place] [address] -- [phone]   Website: [email or web address] 

## 2019-08-23 ENCOUNTER — Telehealth: Payer: Self-pay | Admitting: *Deleted

## 2019-08-23 NOTE — Telephone Encounter (Signed)
"  I want to cancel my surgery for October 29.  I'm going to have another surgery and I want my insurance to cover it.  So I want to do this in the office.  I'll be in the office tomorrow at 9 am.  Hopefully I can talk to you then."

## 2019-08-23 NOTE — Telephone Encounter (Signed)
"  I had set up an appointment to have surgery on my feet on the October 29.  I want to cancel that and just let him take the screws out in the office.  Give me a call to set up an appointment to do this in the office."

## 2019-08-24 ENCOUNTER — Other Ambulatory Visit: Payer: Self-pay

## 2019-08-24 ENCOUNTER — Ambulatory Visit: Payer: Medicare Other | Admitting: Orthotics

## 2019-08-24 DIAGNOSIS — M2012 Hallux valgus (acquired), left foot: Secondary | ICD-10-CM | POA: Diagnosis not present

## 2019-08-24 DIAGNOSIS — E1140 Type 2 diabetes mellitus with diabetic neuropathy, unspecified: Secondary | ICD-10-CM | POA: Diagnosis not present

## 2019-08-24 DIAGNOSIS — M2042 Other hammer toe(s) (acquired), left foot: Secondary | ICD-10-CM | POA: Diagnosis not present

## 2019-08-24 NOTE — Telephone Encounter (Signed)
I am returning your call.  You want to cancel the surgery for the surgical center and schedule your surgery for here in the office, correct?  "Yes, that is right."  Dr. Amalia Hailey can do it on October 19 or October 03, 2019.  "Let's schedule it for October 19.  What time do I need to be there?"  You will need to be here at 7:45 am.  "I'll be there."

## 2019-08-31 DIAGNOSIS — T84498A Other mechanical complication of other internal orthopedic devices, implants and grafts, initial encounter: Secondary | ICD-10-CM | POA: Insufficient documentation

## 2019-09-07 ENCOUNTER — Telehealth: Payer: Self-pay | Admitting: *Deleted

## 2019-09-07 NOTE — Telephone Encounter (Signed)
Called and spoke with Hedy Camara at [facility] [employer] and there is no pre-cert or prior authorization for procedure code 20680 and diagnosis code Z48 and the reference number is [id number]. Lattie Haw

## 2019-09-24 ENCOUNTER — Ambulatory Visit (INDEPENDENT_AMBULATORY_CARE_PROVIDER_SITE_OTHER): Payer: Medicare Other | Admitting: Podiatry

## 2019-09-24 ENCOUNTER — Other Ambulatory Visit: Payer: Self-pay

## 2019-09-24 ENCOUNTER — Encounter: Payer: Self-pay | Admitting: Podiatry

## 2019-09-24 VITALS — BP 120/80 | HR 101 | Resp 16

## 2019-09-24 DIAGNOSIS — T8484XA Pain due to internal orthopedic prosthetic devices, implants and grafts, initial encounter: Secondary | ICD-10-CM

## 2019-09-28 NOTE — Progress Notes (Signed)
OPERATIVE REPORT Patient name: [name] MRN: [id number] DOB: [birth year]  DOS:  09/28/19  Preop Dx: Symptomatic orthopedic hardware x3 right foot Postop Dx: same  Procedure:  1.  Removal of orthopedic screw second digit right foot 2.  Removal of orthopedic screw third digit right foot 3.  Removal of orthopedic screw fourth digit right foot  Surgeon: Edrick Kins DPM  Anesthesia: 50-50 mixture of 2% lidocaine plain with 0.5% Marcaine plain totaling 10 cc infiltrated in the patient's right lower extremity in a digital block fashion  Hemostasis: Ankle tourniquet inflated to a pressure of 249mmHg after esmarch exsanguination   EBL: Minimal mL Materials: None Injectables: None Pathology: None  Condition: The patient tolerated the procedure and anesthesia well. No complications noted or reported   Justification for procedure: The patient is a 58 y.o. female who presents today for surgical correction of removal of symptomatic screws to digits 2, 3, 4 of the right foot. All conservative modalities of been unsuccessful in providing any sort of satisfactory alleviation of symptoms with the patient. The patient was told benefits as well as possible side effects of the surgery. The patient consented for surgical correction. The patient consent form was reviewed. All patient questions were answered. No guarantees were expressed or implied.  Patient consent form obtained last office visit  Procedure in Detail: The patient was brought to the procedure room, placed in the procedure chair in the supine position at which time an aseptic scrub and drape were performed about the patient's respective lower extremity after anesthesia was induced as described above. Attention was then directed to the surgical area where procedure number one commenced.  Procedure #1: Removal orthopedic screw second digit right foot A 1 cm transverse incision was planned and made overlying the distal tuft of the  second digit right foot.  Incision was carried down to the level of the orthopedic screw and the head of the screw was visualized and the screw was removed in toto and placed in sterile specimen container.  Irrigation utilized and primary closure obtained using 4-0 nylon suture.  Procedure #2: Removal orthopedic screw third digit right foot Procedure #2 commenced in the exact same fashion manner as procedure #1 with the exception that procedure #2 was performed to the third digit right foot  Procedure #3: Removal orthopedic screw fourth digit right foot Procedure #3 commenced in the exact same fashion manner as procedure #1 with the exception that the procedure was performed to the fourth digit right foot  Dry sterile compressive dressings were then applied to all previously mentioned incision sites about the patient's lower extremity. The tourniquet which was used for hemostasis was deflated. All normal neurovascular responses including pink color and warmth returned all the digits of patient's lower extremity.  The patient was then discharged with adequate prescriptions for analgesia. Verbal as well as written instructions were provided for the patient regarding wound care. The patient is to keep the dressings clean dry and intact until they are to follow surgeon Dr. Daylene Katayama in the office upon discharge.   Edrick Kins, DPM [facility]  Dr. Edrick Kins, [place]                                        [place], [place] [address]  Office [phone]  Fax [phone]

## 2019-10-01 ENCOUNTER — Encounter: Payer: Medicare Other | Admitting: Podiatry

## 2019-10-03 ENCOUNTER — Other Ambulatory Visit: Payer: Self-pay

## 2019-10-03 ENCOUNTER — Ambulatory Visit (INDEPENDENT_AMBULATORY_CARE_PROVIDER_SITE_OTHER): Payer: Medicare Other

## 2019-10-03 ENCOUNTER — Ambulatory Visit (INDEPENDENT_AMBULATORY_CARE_PROVIDER_SITE_OTHER): Payer: Self-pay | Admitting: Podiatry

## 2019-10-03 DIAGNOSIS — T8484XA Pain due to internal orthopedic prosthetic devices, implants and grafts, initial encounter: Secondary | ICD-10-CM

## 2019-10-03 DIAGNOSIS — Z9889 Other specified postprocedural states: Secondary | ICD-10-CM | POA: Diagnosis not present

## 2019-10-07 NOTE — Progress Notes (Signed)
   Subjective:  Patient presents today status post removal of hardware digits 2, 3 and 4 right. DOS: 09/26/2019. She states she is doing well regarding surgery. She denies any significant pain in the right foot or modifying factors. She has been using the post op shoe as directed.  She does have a complaint of aching pain in the medial left ankle and left plantar midfoot that began a few days ago. She has been using Voltaren gel which has helped to alleviate the symptoms. Being on the foot increases the pain. Patient is here for further evaluation and treatment.   Past Medical History:  Diagnosis Date  . Arthritis   . Cervicalgia   . Chondromalacia of patella   . Chronic neck pain   . Chronic pain syndrome   . Depression    secondary to loss of her son at age 79  . Diabetes mellitus   . Diabetic neuropathy ([place])   . DMII (diabetes mellitus, type 2) ([place])   . Enthesopathy of hip region   . Fibromyalgia   . Hep C w/o coma, chronic ([place])   . Hepatitis C    diagnosed 2005  . Hypertension   . Insomnia   . Insomnia   . Low back pain   . Lumbago   . Obesity   . Pain in joint, upper arm   . Primary localized osteoarthrosis, lower leg   . 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 Tobacco abuse   . 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  Skin incisions appear to be well coapted with sutures and staples intact. No sign of infectious process noted. No dehiscence. No active bleeding noted. Moderate edema noted to the surgical extremity. Pain with palpation noted to the medial, lateral and anterior aspects of the left ankle. Pain with palpation also noted to the left heel.   Radiographic Exam:  Orthopedic hardware and osteotomies sites appear to be stable with routine healing.  Assessment: 1. s/p ROH digits 2, 3 and 4. DOS: 09/26/2019 2. Ankle synovitis left 3. Plantar fasciitis left - midsubstance    Plan of Care:   1. Patient was evaluated. X-rays reviewed 2. Continue using OTC Voltaren gel.  3. Dressing changed.  4. Continue using post op shoe right.  5. Return to clinic in one week for suture removal.     Edrick Kins, DPM [facility]  Dr. Edrick Kins, [place]                                        [place], [place] [address]                Office [phone]  Fax [phone]

## 2019-10-08 ENCOUNTER — Encounter: Payer: Medicare Other | Admitting: Podiatry

## 2019-10-10 ENCOUNTER — Ambulatory Visit (INDEPENDENT_AMBULATORY_CARE_PROVIDER_SITE_OTHER): Payer: Medicare Other | Admitting: Podiatry

## 2019-10-10 ENCOUNTER — Other Ambulatory Visit: Payer: Self-pay

## 2019-10-10 DIAGNOSIS — M79676 Pain in unspecified toe(s): Secondary | ICD-10-CM | POA: Diagnosis not present

## 2019-10-10 DIAGNOSIS — B351 Tinea unguium: Secondary | ICD-10-CM | POA: Diagnosis not present

## 2019-10-10 DIAGNOSIS — T8484XA Pain due to internal orthopedic prosthetic devices, implants and grafts, initial encounter: Secondary | ICD-10-CM | POA: Diagnosis not present

## 2019-10-10 DIAGNOSIS — M778 Other enthesopathies, not elsewhere classified: Secondary | ICD-10-CM

## 2019-10-10 DIAGNOSIS — Z9889 Other specified postprocedural states: Secondary | ICD-10-CM

## 2019-10-15 NOTE — Progress Notes (Signed)
   Subjective:  Patient presents today status post removal of hardware from digits 2, 3 and 4 right. DOS: 09/26/2019. She states she is doing well overall. She reports some swelling of the right foot. She reports some pain in the plantar aspects of the bilateral feet. She has been using OTC Voltaren Gel and the post op shoe on the right foot as directed. Being on the foot increases the swelling. Patient is here for further evaluation and treatment.    Past Medical History:  Diagnosis Date  . Arthritis   . Cervicalgia   . Chondromalacia of patella   . Chronic neck pain   . Chronic pain syndrome   . Depression    secondary to loss of her son at age 58  . Diabetes mellitus   . Diabetic neuropathy ([place])   . DMII (diabetes mellitus, type 2) ([place])   . Enthesopathy of hip region   . Fibromyalgia   . Hep C w/o coma, chronic ([place])   . Hepatitis C    diagnosed 2005  . Hypertension   . Insomnia   . Insomnia   . Low back pain   . Lumbago   . Obesity   . Pain in joint, upper arm   . Primary localized osteoarthrosis, lower leg   . 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 Tobacco abuse   . 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  Skin incisions appear to be well coapted with sutures and staples intact. No sign of infectious process noted. No dehiscence. No active bleeding noted. Moderate edema noted to the surgical extremity. Nails are tender, long, thickened and dystrophic with subungual debris, consistent with onychomycosis, 1-5 bilateral. No signs of infection noted. Pain with palpation noted to the left midfoot.   Assessment: 1. s/p ROH digits 2, 3 and 4 right. DOS: 09/26/2019 2. Capsulitis left midfoot 3. Onychodystrophic nails 1-5 bilateral with hyperkeratosis of nails.  4. Onychomycosis of nail due to dermatophyte bilateral   Plan of Care:  1. Patient was evaluated.  2. Sutures removed.  3. Resume wearing DM  shoes.  4. Mechanical debridement of nails 1-5 bilaterally performed using a nail nipper. Filed with dremel without incident.  5. Injection of 0.5 mLs Celestone Soluspan injected into the left midfoot.  6. Return to clinic in 3 months.    Edrick Kins, DPM [facility]  Dr. Edrick Kins, [place]                                        [place], [place] [address]                Office [phone]  Fax [phone]

## 2019-11-20 ENCOUNTER — Telehealth: Payer: Self-pay | Admitting: Podiatry

## 2019-11-20 NOTE — Telephone Encounter (Signed)
I signed a medical records release form to have my medical records sent to Dr. Emogene Morgan and their fax number is [phone] for a second opinion. Call me when you get this message please.

## 2019-11-20 NOTE — Telephone Encounter (Signed)
I'm calling to let you know I just filled out another release form. They are supposed to be putting it in my chart, so if you can get it, I would appreciate it.

## 2019-11-20 NOTE — Telephone Encounter (Signed)
I called pt back to ask which practice Dr. Emogene Morgan was with but she didn't know. Stated she signed a release form in our office about a month ago. I told her I did not have the release form, but I faxed her records to [facility] yesterday from the release form she signed with them. Pt stated she doesn't think she will be going there. Said she would be in [place] tomorrow and would come by to sign another release form.

## 2019-11-23 ENCOUNTER — Telehealth: Payer: Self-pay | Admitting: Podiatry

## 2019-11-23 NOTE — Telephone Encounter (Signed)
Left voicemail letting pt know that I did receive her signed medical records release form the other day and I went ahead and faxed her records to Dr. Emogene Morgan. Told her to call with any other questions.

## 2019-11-23 NOTE — Telephone Encounter (Signed)
I came in and signed a release form the other day to have my medical records sent to Dr. Emogene Morgan. I was calling to make sure you received that request. Please call me when you get this message.

## 2020-01-14 ENCOUNTER — Ambulatory Visit: Payer: Medicare Other | Admitting: Podiatry

## 2020-02-26 DIAGNOSIS — M2020 Hallux rigidus, unspecified foot: Secondary | ICD-10-CM | POA: Insufficient documentation

## 2020-02-26 DIAGNOSIS — M2010 Hallux valgus (acquired), unspecified foot: Secondary | ICD-10-CM | POA: Insufficient documentation

## 2020-02-26 DIAGNOSIS — Z969 Presence of functional implant, unspecified: Secondary | ICD-10-CM | POA: Insufficient documentation

## 2020-04-05 DIAGNOSIS — Z8249 Family history of ischemic heart disease and other diseases of the circulatory system: Secondary | ICD-10-CM | POA: Insufficient documentation

## 2020-04-25 DIAGNOSIS — J3489 Other specified disorders of nose and nasal sinuses: Secondary | ICD-10-CM | POA: Insufficient documentation

## 2020-09-11 ENCOUNTER — Ambulatory Visit (HOSPITAL_COMMUNITY)
Admission: EM | Admit: 2020-09-11 | Discharge: 2020-09-11 | Disposition: A | Discharge status: Home / Self Care | Payer: Medicare Other | Attending: Internal Medicine | Admitting: Internal Medicine

## 2020-09-11 ENCOUNTER — Other Ambulatory Visit: Payer: Self-pay

## 2020-09-11 ENCOUNTER — Encounter (HOSPITAL_COMMUNITY): Payer: Self-pay | Admitting: *Deleted

## 2020-09-11 DIAGNOSIS — G8929 Other chronic pain: Secondary | ICD-10-CM | POA: Diagnosis not present

## 2020-09-11 DIAGNOSIS — M542 Cervicalgia: Secondary | ICD-10-CM

## 2020-09-11 MED ORDER — KETOROLAC TROMETHAMINE 60 MG/2ML IM SOLN
INTRAMUSCULAR | Status: AC
  Filled 2020-09-11: qty 2

## 2020-09-11 MED ORDER — LIDOCAINE 5 % EX PTCH: 1.0000 | MEDICATED_PATCH | CUTANEOUS | 0 refills | Status: DC

## 2020-09-11 MED ORDER — KETOROLAC TROMETHAMINE 60 MG/2ML IM SOLN
60.0000 mg | Freq: Once | INTRAMUSCULAR | Status: AC
  Administered 2020-09-11: 60 mg via INTRAMUSCULAR

## 2020-09-11 NOTE — Discharge Instructions (Addendum)
You will need to see pain specialist for further management of your neck pain. If symptoms persist ,please see you PCP sooner.

## 2020-09-11 NOTE — ED Triage Notes (Signed)
Patient in with complaints of neck pain that has been ongoing for 20 years. Patient states that she takes oxycodone at home. Patient was seen by PCP this am. Patient states that she was told by a friend that a "shot" helped her pain when she was seen here and patient stated she wanted to come here to see if it would help.

## 2020-09-11 NOTE — ED Provider Notes (Signed)
[name]    CSN: [id number] Arrival date & time: 09/11/20  1020      History   Chief Complaint Chief Complaint  Patient presents with   Neck Pain    HPI [name] is a 59 y.o. female to the urgent care with complaints of neck pain which has been ongoing for the past 20 years.  Patient has been taking narcotics at home for the neck pain.  Does not seem to help.  She denies any radiation of pain into the hands.  No weakness in the upper extremities.  Pain is aggravated by movement.  No known relieving factors.  Patient came to the urgent care with the understanding that I could give her.   Patient came to the urgent care upon the advice of a friend.  Her friend apparently received a shot of Toradol and helped so the patient is requesting the same thing.  HPI  Past Medical History:  Diagnosis Date   Arthritis    Cervicalgia    Chondromalacia of patella    Chronic neck pain    Chronic pain syndrome    Depression    secondary to loss of her son at age 33   Diabetes mellitus    Diabetic neuropathy ([place])    DMII (diabetes mellitus, type 2) ([place])    Enthesopathy of hip region    Fibromyalgia    Hep C w/o coma, chronic ([place])    Hepatitis C    diagnosed 2005   Hypertension    Insomnia    Insomnia    Low back pain    Lumbago    Obesity    Pain in joint, upper arm    Primary localized osteoarthrosis, lower leg    Substance abuse ([place])    sober since 2001   Tobacco abuse    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    Patient Active Problem List   Diagnosis Date Noted   Mechanical complication associated with orthopedic device ([place]) 08/31/2019   Hypercholesterolemia 08/17/2019   Lateral epicondylitis of right elbow 03/31/2018   Arthritis of knee 03/09/2018   Arthritis of foot, degenerative 02/28/2018   Hallux valgus (acquired), left foot 02/28/2018   Long term current use of oral hypoglycemic drug 01/16/2018    Pharyngeal dysphagia 12/21/2017   Left thyroid nodule 12/21/2017   Cigarette smoker 12/21/2017   History of colon polyps 09/21/2017   History of atrial fibrillation 08/23/2017   GERD (gastroesophageal reflux disease) 08/23/2017   Presbyopia of both eyes 07/15/2017   Nuclear sclerotic cataract of both eyes 07/15/2017   Dry eye syndrome of both lacrimal glands 07/15/2017   Acquired trigger finger of left ring finger 05/04/2017   Prolonged Q-T interval on ECG 01/17/2017   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 01/17/2017   Postural dizziness with presyncope 12/30/2016   Paroxysmal atrial fibrillation ([place]) 12/30/2016   Osteoarthritis 12/30/2016   NSAID long-term use 12/30/2016   Anemia 12/30/2016   DDD (degenerative disc disease), lumbar 11/26/2015   Facet syndrome, lumbar 11/26/2015   Greater trochanteric bursitis 11/26/2015   Diabetic neuropathy ([place]) 11/26/2015   Insomnia 11/11/2015   PTSD (post-traumatic stress disorder) 11/11/2015   GAD (generalized anxiety disorder) 11/11/2015   Severe episode of recurrent major depressive disorder, without psychotic features ([place]) 11/11/2015   Depression 11/11/2015   Cough 06/27/2012   Obesity 06/27/2012   Tobacco use 06/27/2012   Sacroiliac joint dysfunction 05/18/2012   Right low back pain 04/19/2012   Cervical neck pain with  evidence of disc disease 02/11/2012   Trochanteric bursitis of right hip 02/11/2012   Plantar fasciitis 03/11/2011   INSOMNIA 01/18/2008   Chronic pain syndrome 12/15/2007   DIABETIC PERIPHERAL NEUROPATHY 05/08/2007   HEPATITIS C 02/27/2007   DIABETES MELLITUS, TYPE II 02/27/2007   DEPRESSION 02/27/2007   HYPERTENSION 02/27/2007    Past Surgical History:  Procedure Laterality Date   ABDOMINAL HYSTERECTOMY     at age 34, unknown reasons   CARPAL TUNNEL RELEASE  jan 2013   left side   FOOT SURGERY Bilateral     OB History    Gravida  2   Para  2   Term       Preterm      AB      Living        SAB      TAB      Ectopic      Multiple      Live Births               Home Medications    Prior to Admission medications   Medication Sig Start Date End Date Taking? Authorizing Provider  atorvastatin (LIPITOR) 20 MG tablet Take 20 mg by mouth daily.   Yes [provider]  Biotin 10 MG CAPS Take 10 mg by mouth daily.   Yes [provider]  buPROPion (WELLBUTRIN SR) 150 MG 12 hr tablet  01/29/19  Yes [provider]  lubiprostone (AMITIZA) 24 MCG capsule Take 24 mcg by mouth 2 (two) times daily.   Yes [provider]  nystatin (NYSTATIN) powder Apply topically 3 (three) times daily.   Yes [provider]  omeprazole (PRILOSEC) 20 MG capsule Take 1 capsule (20 mg total) by mouth daily. 06/07/12  Yes Bartholomew Crews, MD  oxyCODONE (ROXICODONE) 15 MG immediate release tablet Take 1 tablet (15 mg total) by mouth every 4 (four) hours as needed for pain. 04/27/19  Yes Tegeler, Gwenyth Allegra, MD  potassium chloride (MICRO-K) 10 MEQ CR capsule Take 20 mEq by mouth daily.   Yes [provider]  pregabalin (LYRICA) 200 MG capsule  12/21/18  Yes [provider]  sucralfate (CARAFATE) 1 g tablet sucralfate 1 gram tablet   Yes [provider]  traZODone (DESYREL) 100 MG tablet Take 2.5 tablets (250 mg total) by mouth at bedtime as needed for sleep. 11/23/18  Yes [name], MD  ACCU-CHEK FASTCLIX LANCETS MISC 1 each by Does not apply route daily. Check blood sugar once daily. 250.00 10/26/11   [name], DO  albuterol (PROVENTIL HFA;VENTOLIN HFA) 108 (90 Base) MCG/ACT inhaler Inhale 2 puffs into the lungs every 6 (six) hours as needed for wheezing or shortness of breath.     [provider]  buPROPion (WELLBUTRIN SR) 200 MG 12 hr tablet TK 1 T PO D 02/09/19   [provider]  Calcium Carbonate (CALCARB 600) 1500 MG TABS Take 1 tablet (1,500 mg total)  by mouth 2 (two) times daily. 10/25/11   Burman Freestone, MD  celecoxib (CELEBREX) 200 MG capsule Take 200 mg by mouth 2 (two) times daily.     [provider]  cephALEXin (KEFLEX) 500 MG capsule Take 1 capsule (500 mg total) by mouth 3 (three) times daily. 08/30/18   Edrick Kins, DPM  CEQUA 0.09 % SOLN Apply 1 drop to eye 2 (two) times daily. 06/12/19   [provider]  Cholecalciferol (VITAMIN D-1000 MAX ST) 1000 units  tablet Take by mouth.    [provider]  clotrimazole-betamethasone (LOTRISONE) cream Apply 1 application topically 2 (two) times daily.    [provider]  Cyanocobalamin (VITAMIN B 12 PO) Take 50 mcg by mouth.    [provider]  cyclobenzaprine (FLEXERIL) 5 MG tablet Take 1 tablet (5 mg total) by mouth at bedtime. 08/26/18   Landis Martins, DPM  DEXILANT 60 MG capsule  12/21/18   [provider]  diltiazem (CARDIZEM CD) 120 MG 24 hr capsule Take by mouth.    [provider]  diltiazem (DILACOR XR) 240 MG 24 hr capsule Take 240 mg by mouth daily.    [provider]  doxycycline (VIBRA-TABS) 100 MG tablet Take 1 tablet (100 mg total) by mouth 2 (two) times daily. 05/02/19   Edrick Kins, DPM  DULoxetine (CYMBALTA) 20 MG capsule  04/05/19   [provider]  DULoxetine (CYMBALTA) 60 MG capsule TAKE 2 CAPSULE BY MOUTH EVERY DAY 02/01/19   [name], MD  DULoxetine (CYMBALTA) 60 MG capsule  01/03/19   [provider]  ergocalciferol (VITAMIN D2) 50000 UNITS capsule Take 50,000 Units by mouth every Sunday.     [provider]  estrogens, conjugated, (PREMARIN) 0.3 MG tablet Take 0.3 mg by mouth daily. Take daily for 21 days then do not take for 7 days.    [provider]  famotidine (PEPCID) 20 MG tablet  03/20/19   [provider]  ferrous sulfate 325 (65 FE) MG tablet Take 325 mg by mouth daily with breakfast.    [provider]  glucose blood (ACCU-CHEK  SMARTVIEW) test strip Check blood sugar once daily. 250.00 10/26/11 10/25/12  [name], DO  guaiFENesin (MUCINEX) 600 MG 12 hr tablet Take by mouth daily.    [provider]  hydroxypropyl methylcellulose / hypromellose (ISOPTO TEARS / GONIOVISC) 2.5 % ophthalmic solution Place 1 drop into both eyes 3 (three) times daily as needed for dry eyes.    [provider]  hydrOXYzine (ATARAX/VISTARIL) 25 MG tablet  12/21/18   [provider]  ibuprofen (ADVIL,MOTRIN) 800 MG tablet TAKE 1 TABLET(800 MG) BY MOUTH EVERY 8 HOURS AS NEEDED FOR MILD PAIN OR MODERATE PAIN 02/09/19   Edrick Kins, DPM  Iron-FA-B Cmp-C-Biot-Probiotic (FUSION PLUS PO) Take 1 tablet by mouth daily. Reported on 03/09/2016    [provider]  LACTOBACILLUS PO Take by mouth.    [provider]  lidocaine (LIDODERM) 5 % Place 1 patch onto the skin daily. Remove & Discard patch within 12 hours or as directed by MD 09/11/20   Chase Picket, MD  linaclotide (LINZESS) 145 MCG CAPS capsule Take 145 mcg by mouth daily before breakfast.    [provider]  linagliptin (TRADJENTA) 5 MG TABS tablet Take 5 mg by mouth daily.    [provider]  metoCLOPramide (REGLAN) 10 MG tablet TK 1 T PO HS 12/19/18   [provider]  mupirocin ointment (BACTROBAN) 2 % Apply topically 3 (three) times daily. 11/22/12   Janell Quiet, MD  naloxone [facility]) nasal spray 4 mg/0.1 mL Place 1 spray into the nose.    [provider]  Oxycodone HCl 10 MG TABS Take 10 mg by mouth 4 (four) times daily. Reported on 11/26/2015    [provider]  oxyCODONE-acetaminophen (PERCOCET) 5-325 MG tablet Take 1 tablet by mouth every 4 (four) hours as needed for severe pain. 08/24/18   Edrick Kins,  DPM  piroxicam (FELDENE) 10 MG capsule TAKE 1 CAPSULE BY MOUTH DAILY 06/06/12   Milta Deiters, MD  RESTASIS 0.05 % ophthalmic emulsion INT 1 GTT INTO OU BID 02/09/19   [provider]   Semaglutide,0.25 or 0.5MG /DOS, (OZEMPIC, 0.25 OR 0.5 MG/DOSE,) 2 MG/1.5ML SOPN INJECT 0.5 MG ONCE A WEEK 07/18/19   [provider]  silver sulfADIAZINE (SILVADENE) 1 % cream Apply 1 application topically daily. 07/17/19   Edrick Kins, DPM  VOLTAREN 1 % GEL APP 1 GRAM EXT AA D UTD 11/24/16   [provider]  XIIDRA 5 % SOLN INT 1 GTT INTO OU BID 01/23/19   [provider]  rivaroxaban (XARELTO) 20 MG TABS tablet Take 20 mg by mouth daily with supper.  09/11/20  [provider]    Family History Family History  Problem Relation Age of Onset   Uterine cancer Mother    Alcohol abuse Father    Alcohol abuse Sister    Drug abuse Sister    Drug abuse Brother    Alcohol abuse Brother    Schizophrenia Son    Diabetes Other    Arthritis Other    Hypertension Other    Heart attack Son 34       Died suddenly    Social History Social History   Tobacco Use   Smoking status: Current Every Day Smoker    Packs/day: 0.50    Years: 35.00    Pack years: 17.50    Types: Cigarettes   Smokeless tobacco: Never Used  Scientific laboratory technician Use: Never used  Substance Use Topics   Alcohol use: No   Drug use: No    Types: "Crack" cocaine    Comment: last used Oct 2016     Allergies   Hydrocodone-acetaminophen, Hydrocodone-acetaminophen, and Hydrocodone   Review of Systems Review of Systems  Constitutional: Negative.   Respiratory: Negative.   Musculoskeletal: Positive for neck pain. Negative for arthralgias and neck stiffness.  Neurological: Negative.  Negative for headaches.  Psychiatric/Behavioral: Negative for confusion.     Physical Exam Triage Vital Signs ED Triage Vitals [09/11/20 1112]  Enc Vitals Group     BP      Pulse      Resp      Temp      Temp src      SpO2      Weight      Height      Head Circumference      Peak Flow      Pain Score 7     Pain Loc      Pain Edu?      Excl. in [place]?    No data  found.  Updated Vital Signs LMP 12/06/1976   Visual Acuity Right Eye Distance:   Left Eye Distance:   Bilateral Distance:    Right Eye Near:   Left Eye Near:    Bilateral Near:     Physical Exam Vitals and nursing note reviewed.  Pulmonary:     Effort: Pulmonary effort is normal.     Breath sounds: Normal breath sounds.  Abdominal:     General: Bowel sounds are normal.     Palpations: Abdomen is soft.  Musculoskeletal:        General: No swelling, tenderness or deformity. Normal range of motion.  Skin:    General: Skin is warm.     Capillary Refill: Capillary refill takes less than 2 seconds.  Neurological:     General: No focal deficit present.     Mental Status: She is oriented to person, place, and time.      UC Treatments / Results  Labs (all labs ordered are listed, but only abnormal results are displayed) Labs Reviewed - No data to display  EKG   Radiology No results found.  Procedures Procedures (including critical care time)  Medications Ordered in UC Medications  ketorolac (TORADOL) injection 60 mg (60 mg Intramuscular Given 09/11/20 1301)    Initial Impression / Assessment and Plan / UC Course  I have reviewed the triage vital signs and the nursing notes.  Pertinent labs & imaging results that were available during my care of the patient were reviewed by me and considered in my medical decision making (see chart for details).     1.  Chronic neck pain with normal neurologic exam: Toradol 60 mg IM x1 dose Patient is advised to continue taking her current pain medications. Follow-up with primary care physician for possible referral to a neurosurgeon.  Patient is not enthused about surgical options for the neck pain. Final Clinical Impressions(s) / UC Diagnoses   Final diagnoses:  Chronic neck pain with normal neurological examination     Discharge Instructions     You will need to see pain specialist for further management of your neck  pain. If symptoms persist ,please see you PCP sooner.   ED Prescriptions    Medication Sig Dispense Auth. Provider   lidocaine (LIDODERM) 5 % Place 1 patch onto the skin daily. Remove & Discard patch within 12 hours or as directed by MD 30 patch Cristiana Yochim, Myrene Galas, MD     PDMP not reviewed this encounter.   Chase Picket, MD 09/12/20 1254

## 2020-11-07 ENCOUNTER — Ambulatory Visit (INDEPENDENT_AMBULATORY_CARE_PROVIDER_SITE_OTHER): Payer: Medicare Other | Admitting: Podiatry

## 2020-11-07 ENCOUNTER — Other Ambulatory Visit: Payer: Self-pay

## 2020-11-07 ENCOUNTER — Ambulatory Visit (INDEPENDENT_AMBULATORY_CARE_PROVIDER_SITE_OTHER): Payer: Medicare Other

## 2020-11-07 DIAGNOSIS — M2041 Other hammer toe(s) (acquired), right foot: Secondary | ICD-10-CM

## 2020-11-07 DIAGNOSIS — M2021 Hallux rigidus, right foot: Secondary | ICD-10-CM | POA: Diagnosis not present

## 2020-11-07 DIAGNOSIS — M21622 Bunionette of left foot: Secondary | ICD-10-CM

## 2020-11-07 DIAGNOSIS — Z969 Presence of functional implant, unspecified: Secondary | ICD-10-CM | POA: Diagnosis not present

## 2020-11-07 DIAGNOSIS — M21621 Bunionette of right foot: Secondary | ICD-10-CM

## 2020-11-07 DIAGNOSIS — M21961 Unspecified acquired deformity of right lower leg: Secondary | ICD-10-CM | POA: Diagnosis not present

## 2020-11-07 DIAGNOSIS — M79672 Pain in left foot: Secondary | ICD-10-CM

## 2020-11-07 DIAGNOSIS — M79671 Pain in right foot: Secondary | ICD-10-CM

## 2020-11-08 NOTE — Progress Notes (Unsigned)
  Subjective:  Patient ID: [name], female    DOB: May 19, 1961,  MRN: [id number]  Chief Complaint  Patient presents with  . Foot Pain    Left 5th digit painful screw in foot.  . Bunions    Right bunion pain  . Foot Problem    Right 2nd digit "turning"    59 y.o. female presents with the above complaint. History confirmed with patient. Endorses history of multiple procedures to both feet. Pain is worse on the right, her major complaint is the prominent screw in her left foot  Objective:  Physical Exam: warm, good capillary refill, no trophic changes or ulcerative lesions, normal DP and PT pulses and normal sensory exam. Left Foot: POP left 5th metatarsal with palpable hardware  Right Foot: 1st metatarsal dorsal prominence, decreased joint ROM with pain and crepitus on ROM. POP 2nd/3rd MPJs and toes, prominence dorsal 5th metatarsal  No images are attached to the encounter.  Radiographs: X-ray of both feet:   Left: Hallux IPJ arthroplasty with good arthrodesis.  Evidence of metatarsal osteotomy first metatarsal fully healed. Fixation screws presents to the left fifth metatarsal.  Fifth metatarsal does have some excess bone.  Evidence of prior arthroplasties of lesser digits.  Good digital alignment  Right Foot:Hallux valgus right foot with displaced but healed dorsal capital fragment.  Fixation staple present in the great toe.  Healed elongated second third metatarsals with lateral deviation of the digits.  Evidence of arthroplasty of the second third fourth fifth toes Assessment:   1. Hallux rigidus of right foot   2. Retained orthopedic hardware   3. Metatarsal deformity, right   4. Acquired hammertoe of right foot   5. Tailor's bunionette, left   6. Tailor's bunionette, right      Plan:  Patient was evaluated and treated and all questions answered.   Hallux rigidus, metatarsal deformity, hammertoes right foot, prominent hardware left foot ; tailor's bunion  bilateral -XR reviewed with patient -Educated on etiology of deformity -Discussed proper shoe gear modifications and padding  -Patient has failed all conservative therapy and wishes to proceed with surgical intervention. All risks, benefits, and alternatives discussed with patient. No guarantees given. Consent reviewed and signed by patient. -Planned procedures: Left foot removal of hardware. Right foot 1st metatarsophalangeal joint replacement, 2nd/3rd metatarsal shortening osteotomy, 2nd/3rd hammertoe corrections with pin fixation, 5th metatarsal correction of tailor's bunion -Identified risk factors: tobacco use -DME dispensed for post-op use: none   No follow-ups on file.

## 2020-11-11 ENCOUNTER — Other Ambulatory Visit: Payer: Self-pay | Admitting: Podiatry

## 2020-11-11 DIAGNOSIS — M21622 Bunionette of left foot: Secondary | ICD-10-CM

## 2020-11-11 DIAGNOSIS — M21621 Bunionette of right foot: Secondary | ICD-10-CM

## 2020-12-09 ENCOUNTER — Telehealth: Payer: Self-pay

## 2020-12-09 NOTE — Telephone Encounter (Signed)
DOS 12/17/2020  KELLER BUNION IMPLANT RT - 28291 MET HEAD RESECTION RT - 28113 METATARSAL OSTEOTOMY 2,3 RT - 28308 REMOVAL FIXATION DEEP LT - 20680 HAMMERTOE REPAIR 2,3 RT - 28285 HALLUX IPJ FUSION 1ST RT - 28760  [facility] EFFECTIVE DATE - 12/06/2020  PLAN DEDUCTIBLE - $0.00 OUT OF POCKET - $7550.00 W/ $7550.00 REMAINING   CO-INSURANCE 20% / Day OUTPATIENT SURGERY 20% / [place] $0 / Day OUTPATIENT SURGERY $0 / [place] Notification or Prior Authorization is not required for the requested services  This [employer] Medicare Advantage members plan does not currently require a prior authorization for these services. If you have general questions about the prior authorization requirements, please call us at [phone] or visit [email or web address] > Clinician Resources > Advance and Admission Notification Requirements. The number above acknowledges your notification. Please write this number down for future reference. Notification is not a guarantee of coverage or payment.  Decision ID #:[id number]

## 2020-12-17 ENCOUNTER — Telehealth: Payer: Self-pay | Admitting: Podiatry

## 2020-12-17 NOTE — Telephone Encounter (Signed)
Patient was unsure if she needed to be seen or rescheduled for surgery. There was a complication with patients legs were they went numb with absolutely no feeling. The surgery center wasn't clear on next steps and confused patient per patient. Please Advise

## 2020-12-18 ENCOUNTER — Telehealth: Payer: Self-pay | Admitting: Podiatry

## 2020-12-18 NOTE — Telephone Encounter (Signed)
Pt called the [place] office stating that she did not get sx on 12/17/2020 due to numbness in her foot. She is currently in the hospital and will call back to r/s her sx when she is out.

## 2020-12-23 ENCOUNTER — Encounter: Payer: Medicare Other | Admitting: Podiatry

## 2020-12-30 ENCOUNTER — Encounter: Payer: Medicare Other | Admitting: Podiatry

## 2021-01-01 DIAGNOSIS — M79642 Pain in left hand: Secondary | ICD-10-CM | POA: Insufficient documentation

## 2021-01-02 ENCOUNTER — Ambulatory Visit (INDEPENDENT_AMBULATORY_CARE_PROVIDER_SITE_OTHER): Payer: Medicare Other | Admitting: Podiatry

## 2021-01-02 ENCOUNTER — Other Ambulatory Visit: Payer: Self-pay

## 2021-01-02 DIAGNOSIS — M2041 Other hammer toe(s) (acquired), right foot: Secondary | ICD-10-CM

## 2021-01-02 DIAGNOSIS — Z969 Presence of functional implant, unspecified: Secondary | ICD-10-CM

## 2021-01-02 DIAGNOSIS — M21961 Unspecified acquired deformity of right lower leg: Secondary | ICD-10-CM

## 2021-01-02 DIAGNOSIS — Z01818 Encounter for other preprocedural examination: Secondary | ICD-10-CM

## 2021-01-02 DIAGNOSIS — M2021 Hallux rigidus, right foot: Secondary | ICD-10-CM

## 2021-01-02 DIAGNOSIS — M21622 Bunionette of left foot: Secondary | ICD-10-CM

## 2021-01-02 NOTE — Progress Notes (Signed)
  Subjective:  Patient ID: [name], female    DOB: 12-Feb-1961,  MRN: [id number]  Chief Complaint  Patient presents with  . Foot Problem    Surgical consult    60 y.o. female presents with the above complaint. History confirmed with patient. Was admitted after her surgery was canceled for electrolyte abnormality. States her numbness and other symptoms are now resolved. She would like to schedule surgery again and would like to discuss an additional procedure on her big and 5th toe of the right foot. States they hurt with shoegear as well.  Objective:  Physical Exam: warm, good capillary refill, no trophic changes or ulcerative lesions, normal DP and PT pulses and normal sensory exam. Left Foot: POP left 5th metatarsal with palpable hardware  Right Foot: 1st metatarsal dorsal prominence at MPJ and IPJ, decreased joint ROM with pain and crepitus on ROM. POP 2nd/3rd MPJs and toes, 5th toe, prominence dorsal 5th metatarsal  No images are attached to the encounter.  Radiographs: 12/3 X-ray of both feet:    Left: Hallux IPJ arthroplasty with good arthrodesis.  Evidence of metatarsal osteotomy first metatarsal fully healed. Fixation screws presents to the left fifth metatarsal.  Fifth metatarsal does have some excess bone.  Evidence of prior arthroplasties of lesser digits.  Good digital alignment  Right Foot:Hallux valgus right foot with displaced but healed dorsal capital fragment.  Fixation staple present in the great toe.  Healed elongated second third metatarsals with lateral deviation of the digits.  Evidence of arthroplasty of the second third fourth fifth toes Assessment:   1. Hallux rigidus of right foot   2. Pre-operative exam   3. Metatarsal deformity, right   4. Acquired hammertoe of right foot   5. Tailor's bunionette, left   6. Retained orthopedic hardware    Plan:  Patient was evaluated and treated and all questions answered.   Hallux rigidus, metatarsal  deformity, hammertoes right foot, prominent hardware left foot ; tailor's bunion bilateral -No longer having numbness in both legs. -Recheck electrolytes prior to surgery. CMP ordered. -Patient has failed all conservative therapy and wishes to proceed with surgical intervention. All risks, benefits, and alternatives discussed with patient. No guarantees given. Consent reviewed and signed by patient. -Planned procedures: Left foot removal of hardware. Right foot 1st metatarsophalangeal joint replacement, hallux IPJ arthroplasty, 2nd/3rd metatarsal shortening osteotomy, 2nd/3rd hammertoe corrections with pin fixation, 5th metatarsal correction of tailor's bunion and 5th hammertoe correction -Identified risk factors: tobacco use -DME dispensed for post-op use: none   No follow-ups on file.

## 2021-01-03 LAB — COMPREHENSIVE METABOLIC PANEL
AG Ratio: 1.5 (calc) (ref 1.0–2.5)
ALT: 6 U/L (ref 6–29)
AST: 11 U/L (ref 10–35)
Albumin: 4.4 g/dL (ref 3.6–5.1)
Alkaline phosphatase (APISO): 104 U/L (ref 37–153)
BUN/Creatinine Ratio: 16 (calc) (ref 6–22)
BUN: 20 mg/dL (ref 7–25)
CO2: 24 mmol/L (ref 20–32)
Calcium: 9.6 mg/dL (ref 8.6–10.4)
Chloride: 103 mmol/L (ref 98–110)
Creat: 1.28 mg/dL — ABNORMAL HIGH (ref 0.50–0.99)
Globulin: 3 g/dL (calc) (ref 1.9–3.7)
Glucose, Bld: 81 mg/dL (ref 65–99)
Potassium: 4.5 mmol/L (ref 3.5–5.3)
Sodium: 138 mmol/L (ref 135–146)
Total Bilirubin: 0.4 mg/dL (ref 0.2–1.2)
Total Protein: 7.4 g/dL (ref 6.1–8.1)

## 2021-01-13 ENCOUNTER — Encounter: Payer: Medicare Other | Admitting: Podiatry

## 2021-02-04 ENCOUNTER — Telehealth: Payer: Self-pay

## 2021-02-04 NOTE — Telephone Encounter (Signed)
DOS 02/18/2021  METATARSAL OSTEOTOMY 2,3RT - 67591 TAILORS BUNIONECTOMY RT - 28110 REMOVAL FIXATION DEEP LT - 20680 HAMMERTOE REPAIR 1,2,3 & 5 RT - 28285  [facility] MEDICARE   Notification or Prior Authorization is not required for the requested services  Decision ID #:[id number]

## 2021-02-18 ENCOUNTER — Other Ambulatory Visit: Payer: Self-pay | Admitting: Podiatry

## 2021-02-18 ENCOUNTER — Encounter: Payer: Self-pay | Admitting: Podiatry

## 2021-02-18 DIAGNOSIS — M2021 Hallux rigidus, right foot: Secondary | ICD-10-CM | POA: Diagnosis not present

## 2021-02-18 DIAGNOSIS — M21541 Acquired clubfoot, right foot: Secondary | ICD-10-CM

## 2021-02-18 DIAGNOSIS — M2041 Other hammer toe(s) (acquired), right foot: Secondary | ICD-10-CM

## 2021-02-18 DIAGNOSIS — Z4889 Encounter for other specified surgical aftercare: Secondary | ICD-10-CM

## 2021-02-18 DIAGNOSIS — M2011 Hallux valgus (acquired), right foot: Secondary | ICD-10-CM

## 2021-02-18 MED ORDER — CEPHALEXIN 500 MG PO CAPS: 500.0000 mg | ORAL_CAPSULE | Freq: Two times a day (BID) | ORAL | 0 refills | Status: DC

## 2021-02-18 MED ORDER — ONDANSETRON HCL 4 MG PO TABS: 4.0000 mg | ORAL_TABLET | Freq: Three times a day (TID) | ORAL | 0 refills | Status: DC | PRN

## 2021-02-18 MED ORDER — OXYCODONE-ACETAMINOPHEN 10-325 MG PO TABS: 1.0000 | ORAL_TABLET | ORAL | 0 refills | Status: DC | PRN

## 2021-02-18 NOTE — Progress Notes (Signed)
Rx sent to pharmacy for outpatient surgery. °

## 2021-02-19 ENCOUNTER — Telehealth (INDEPENDENT_AMBULATORY_CARE_PROVIDER_SITE_OTHER): Payer: Medicare Other | Admitting: Podiatry

## 2021-02-19 DIAGNOSIS — M21961 Unspecified acquired deformity of right lower leg: Secondary | ICD-10-CM

## 2021-02-19 DIAGNOSIS — M2021 Hallux rigidus, right foot: Secondary | ICD-10-CM

## 2021-02-19 DIAGNOSIS — M2041 Other hammer toe(s) (acquired), right foot: Secondary | ICD-10-CM

## 2021-02-19 NOTE — Telephone Encounter (Signed)
Patient has requested order for in home health for caregiver, Please Advise

## 2021-02-19 NOTE — Telephone Encounter (Signed)
[name] [phone]  The company the patient would like to receive home health care from

## 2021-02-19 NOTE — Addendum Note (Signed)
Addended by: Hardie Pulley on: 02/19/2021 12:04 PM   Modules accepted: Orders, Level of Service

## 2021-02-24 ENCOUNTER — Ambulatory Visit (INDEPENDENT_AMBULATORY_CARE_PROVIDER_SITE_OTHER): Payer: Medicare Other | Admitting: Podiatry

## 2021-02-24 ENCOUNTER — Other Ambulatory Visit: Payer: Self-pay

## 2021-02-24 ENCOUNTER — Ambulatory Visit (INDEPENDENT_AMBULATORY_CARE_PROVIDER_SITE_OTHER): Payer: Medicare Other

## 2021-02-24 DIAGNOSIS — Z9889 Other specified postprocedural states: Secondary | ICD-10-CM

## 2021-02-24 NOTE — Progress Notes (Signed)
  Subjective:  Patient ID: [name], female    DOB: May 29, 1961,  MRN: [id number]  Chief Complaint  Patient presents with  . Routine Post Op    POV #1 DOS 02/18/2021 2ND 3RD METATARSAL OSTETOMY 2-5 RT, LT REMOVAL FIXATION DEEP KWIRE/SCEW, 1ST 2ND 3RD 5TH HAMMERTOE REPAIR RT    DOS: 02/18/21 Procedure: Left foot removal of hardware. Right foot 1st metatarsophalangeal joint replacement, hallux IPJ arthroplasty, 2nd/3rd metatarsal shortening osteotomy, 2nd/3rd hammertoe corrections with pin fixation, 5th metatarsal correction of tailor's bunion and 5th hammertoe correction  60 y.o. female presents with the above complaint. History confirmed with patient. States she is having some pain that is relieved with her pain medication. Otherwise denies post-op issues or concerns.  Objective:  Physical Exam: tenderness at the surgical site, local edema noted and calf supple, nontender. Incision: healing well, no significant drainage, no dehiscence, no significant erythema  No images are attached to the encounter.  Radiographs: X-ray of both feet: consistent with post-op state good alignment noted.   Assessment:   1. Post-operative state     Plan:  Patient was evaluated and treated and all questions answered.  Post-operative State -XR reviewed with patient -WBAT in Surgical shoe bilat -Declined pain med refill -Dressed right foot with povidone, telfa, DSD. Left foot povidone and band-aid.  No follow-ups on file.

## 2021-02-25 ENCOUNTER — Telehealth: Payer: Self-pay | Admitting: *Deleted

## 2021-02-25 NOTE — Telephone Encounter (Signed)
Patient is calling and requesting home health care assist from [facility].She is having a hard time getting around since having surgery on both feet. Please advise.

## 2021-02-26 NOTE — Telephone Encounter (Signed)
Orders sent previously

## 2021-03-13 ENCOUNTER — Other Ambulatory Visit: Payer: Self-pay

## 2021-03-13 ENCOUNTER — Ambulatory Visit (INDEPENDENT_AMBULATORY_CARE_PROVIDER_SITE_OTHER): Payer: Medicare Other | Admitting: Podiatry

## 2021-03-13 DIAGNOSIS — Z9889 Other specified postprocedural states: Secondary | ICD-10-CM

## 2021-03-31 ENCOUNTER — Ambulatory Visit: Payer: Medicare Other | Admitting: Podiatry

## 2021-03-31 ENCOUNTER — Encounter: Payer: Medicare Other | Admitting: Podiatry

## 2021-04-07 ENCOUNTER — Other Ambulatory Visit: Payer: Self-pay

## 2021-04-07 ENCOUNTER — Ambulatory Visit (INDEPENDENT_AMBULATORY_CARE_PROVIDER_SITE_OTHER): Payer: Medicare Other

## 2021-04-07 ENCOUNTER — Ambulatory Visit (INDEPENDENT_AMBULATORY_CARE_PROVIDER_SITE_OTHER): Payer: Medicare Other | Admitting: Podiatry

## 2021-04-07 ENCOUNTER — Other Ambulatory Visit: Payer: Self-pay | Admitting: Podiatry

## 2021-04-07 DIAGNOSIS — Z9889 Other specified postprocedural states: Secondary | ICD-10-CM

## 2021-04-07 DIAGNOSIS — M2021 Hallux rigidus, right foot: Secondary | ICD-10-CM | POA: Diagnosis not present

## 2021-04-07 NOTE — Addendum Note (Signed)
Addended by: Judy Pimple D on: 04/07/2021 10:44 AM   Modules accepted: Orders

## 2021-04-07 NOTE — Progress Notes (Signed)
  Subjective:  Patient ID: [name], female    DOB: [birth year],  MRN: [id number]  Chief Complaint  Patient presents with  . Routine Post Op    Rm 10 POV #2 DOS 02/18/2021 2ND 3RD METATARSAL OSTETOMY 2-5 RT, LT REMOVAL FIXATION DEEP KWIRE/SCEW, 1ST 2ND 3RD 5TH HAMMERTOE REPAIR RT. Pt states she is doing well. Pt states she has questions for Dr. March Rummage directly.     DOS: 02/18/21 Procedure: Left foot removal of hardware. Right foot 1st metatarsophalangeal joint replacement, hallux IPJ arthroplasty, 2nd/3rd metatarsal shortening osteotomy, 2nd/3rd hammertoe corrections with pin fixation, 5th metatarsal correction of tailor's bunion and 5th hammertoe correction  60 y.o. female presents with the above complaint. History confirmed with patient.  Objective:  Physical Exam: tenderness at the surgical site, local edema noted and calf supple, nontender. Incision: healing well, no significant drainage, no dehiscence, no significant erythema  Assessment:   1. Post-operative state     Plan:  Patient was evaluated and treated and all questions answered.  Post-operative State -Sutures removed. -Still cannot shower due to pins. -F/u in 2 weeks for new XR and possible pin removal.  No follow-ups on file.

## 2021-04-07 NOTE — Progress Notes (Signed)
  Subjective:  Patient ID: Darshana Curnutt, female    DOB: 03-Sep-1961,  MRN: [id number]  Chief Complaint  Patient presents with  . Routine Post Op    POV # 3 DOS: 02/18/2021 2nd/3rd metatarsal osteotomy 2-5 rt, lt removal fixation deep kwire/scew, 1st 2nd 3rd 5th hammertoe repair rt. Reports ER visit on Saturday d/t shooting pains in toes. Also reports some rawness in btwn toes on right foot and on edge of toes. Also concerned with continued swelling.     DOS: 02/18/21 Procedure: Left foot removal of hardware. Right foot 1st metatarsophalangeal joint replacement, hallux IPJ arthroplasty, 2nd/3rd metatarsal shortening osteotomy, 2nd/3rd hammertoe corrections with pin fixation, 5th metatarsal correction of tailor's bunion and 5th hammertoe correction  60 y.o. female presents with the above complaint. History confirmed with patient.  Objective:  Physical Exam: tenderness at the surgical site, local edema noted and calf supple, nontender. Incision: well healed.  Assessment:   1. Post-operative state   2. Hallux rigidus of right foot    Plan:  Patient was evaluated and treated and all questions answered.  Post-operative State -Small retained sutures removed. -Ok to shower and soak at this point. -Start PT for ROM exercises of the MPJs.  Return in about 3 weeks (around 04/28/2021) for Post-Op (No XRs).

## 2021-04-15 ENCOUNTER — Other Ambulatory Visit: Payer: Self-pay

## 2021-04-15 ENCOUNTER — Encounter: Payer: Self-pay | Admitting: Physical Therapy

## 2021-04-15 ENCOUNTER — Ambulatory Visit: Payer: Medicare Other | Attending: Podiatry | Admitting: Physical Therapy

## 2021-04-15 DIAGNOSIS — M79672 Pain in left foot: Secondary | ICD-10-CM | POA: Diagnosis present

## 2021-04-15 DIAGNOSIS — M79671 Pain in right foot: Secondary | ICD-10-CM | POA: Diagnosis present

## 2021-04-15 DIAGNOSIS — M25672 Stiffness of left ankle, not elsewhere classified: Secondary | ICD-10-CM | POA: Diagnosis present

## 2021-04-15 DIAGNOSIS — R2689 Other abnormalities of gait and mobility: Secondary | ICD-10-CM | POA: Diagnosis present

## 2021-04-15 DIAGNOSIS — M25671 Stiffness of right ankle, not elsewhere classified: Secondary | ICD-10-CM | POA: Insufficient documentation

## 2021-04-15 DIAGNOSIS — M6281 Muscle weakness (generalized): Secondary | ICD-10-CM | POA: Diagnosis present

## 2021-04-15 NOTE — Therapy (Signed)
[place], [place], [address] Phone: [phone]   Fax:  [phone]  Physical Therapy Evaluation  Patient Details  Name: [name] MRN: [id number] Date of Birth: 1961-09-13 Referring Provider (PT): Evelina Bucy, DPM   Encounter Date: 04/15/2021   PT End of Session - 04/15/21 1303    Visit Number 1    Number of Visits 8    Date for PT Re-Evaluation 06/10/21    Authorization Type [employer] MCR    Progress Note Due on Visit 10    PT Start Time 1118    PT Stop Time 1202    PT Time Calculation (min) 44 min    Activity Tolerance Patient tolerated treatment well    Behavior During Therapy [facility] for tasks assessed/performed           Past Medical History:  Diagnosis Date  . Arthritis   . Cervicalgia   . Chondromalacia of patella   . Chronic neck pain   . Chronic pain syndrome   . Depression    secondary to loss of her son at age 39  . Diabetes mellitus   . Diabetic neuropathy ([place])   . DMII (diabetes mellitus, type 2) ([employer])   . Enthesopathy of hip region   . Fibromyalgia   . Hep C w/o coma, chronic ([place])   . Hepatitis C    diagnosed 2005  . Hypertension   . Insomnia   . Insomnia   . Low back pain   . Lumbago   . Obesity   . Pain in joint, upper arm   . Primary localized osteoarthrosis, lower leg   . 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 Tobacco abuse   . 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 ABDOMINAL HYSTERECTOMY     at age 57, unknown reasons  . CARPAL TUNNEL RELEASE  jan 2013   left side  . FOOT SURGERY Bilateral     There were no vitals filed for this visit.    Subjective Assessment - 04/15/21 1120    Subjective Patient reports she has had several procedures on her feet due to spurs, bunions, and straightening her toes over the past few years. Most recent surgery was on 02/18/2021. She just has constant  foot pain, and also notes chronic pain syndrome so feels pain throughout body. Patient is still wearing surgcal shoes and was told by her doctor she can start using regular shoes, but she hasn't done this yet. She states she spends most of the day in bed due to foot pain. She continues to have swelling of the right foot and this is a reason she is reluctant to wear regular shoes.    Pertinent History Chronic pain syndrome, depression, DM, HTN, BMI, numerous foot surgeries    Limitations Standing;Walking;House hold activities    How long can you stand comfortably? 2-3 minutes    How long can you walk comfortably? 2-3 minutes    Patient Stated Goals Want the pain to stop and swelling to go out    Currently in Pain? Yes    Pain Score 8     Pain Location Foot    Pain Orientation Right;Left   right worse than left   Pain Descriptors / Indicators Aching;Numbness   "just hurt"   Pain Type Chronic pain;Surgical pain    Pain Onset More than a month ago  Pain Frequency Constant    Aggravating Factors  Constant pain, worse with standing and walking    Pain Relieving Factors Medication              [facility] PT Assessment - 04/15/21 0001      Assessment   Medical Diagnosis Hallux rigidus of right foot    Referring Provider (PT) Evelina Bucy, DPM    Onset Date/Surgical Date 02/18/21    Next MD Visit 05/01/2021    Prior Therapy No      Precautions   Precautions None      Restrictions   Weight Bearing Restrictions No      Balance Screen   Has the patient fallen in the past 6 months Yes    How many times? 1 - about 3 months ago    Has the patient had a decrease in activity level because of a fear of falling?  Yes    Is the patient reluctant to leave their home because of a fear of falling?  Yes      Home Environment   Living Environment Private residence    Living Arrangements Alone    Type of Home Apartment    Home Access Level entry    Home Layout One level      Prior Function    Level of [place] On disability    Leisure None reported      Cognition   Overall Cognitive Status Within Functional Limits for tasks assessed      Observation/Other Assessments   Observations Patient appears in no apparent distress    Focus on Therapeutic Outcomes (FOTO)  NA - not set up      Observation/Other Assessments-Edema    Edema --   observable right foot MTP joint swelling     Sensation   Light Touch Appears Intact      Coordination   Gross Motor Movements are Fluid and Coordinated Yes      Functional Tests   Functional tests Single leg stance      Single Leg Stance   Comments Unable bilaterally      ROM / Strength   AROM / PROM / Strength AROM;Strength      AROM   Overall AROM Comments Minimal toe active motion, increased pain noted with eversion    AROM Assessment Site Ankle    Right/Left Ankle Right;Left    Right Ankle Dorsiflexion 0    Right Ankle Plantar Flexion 50    Right Ankle Inversion 30    Right Ankle Eversion 8    Left Ankle Dorsiflexion 5    Left Ankle Plantar Flexion 50    Left Ankle Inversion 35    Left Ankle Eversion 12      Strength   Overall Strength Comments Pain noted with strength testing all directions    Strength Assessment Site Ankle    Right/Left Ankle Right;Left    Right Ankle Dorsiflexion 4/5    Right Ankle Plantar Flexion 4-/5    Right Ankle Inversion 4/5    Right Ankle Eversion 4-/5    Left Ankle Dorsiflexion 4+/5    Left Ankle Plantar Flexion 4-/5    Left Ankle Inversion 4/5    Left Ankle Eversion 4-/5      Palpation   Palpation comment Patient reports generalized tenderness right > left foot, majority of tenderness noted around MTP joints throughout      Special Tests   Other special tests  None performed      Transfers   Transfers Independent with all Transfers      Ambulation/Gait   Ambulation/Gait Yes    Ambulation/Gait Assistance 7: Independent    Gait Comments Patient ambulating  with bilateral surgical shoes, bilateral toe out with coxalgic gait                      Objective measurements completed on examination: See above findings.       [employer] Adult PT Treatment/Exercise - 04/15/21 0001      Exercises   Exercises Ankle      Ankle Exercises: Stretches   Gastroc Stretch 3 reps;20 seconds    Gastroc Stretch Limitations seated edge of mat with strap      Ankle Exercises: Seated   Towel Crunch Limitations x 20 reps    Towel Inversion/Eversion Limitations x 20 reps    Heel Raises 20 reps    Toe Raise 20 reps                  PT Education - 04/15/21 1302    Education Details Exam findings, POC, HEP, elevation and ankle pumps for swelling, continued icing for pain    Person(s) Educated Patient    Methods Explanation;Demonstration;Verbal cues;Handout    Comprehension Verbalized understanding;Returned demonstration;Verbal cues required;Need further instruction            PT Short Term Goals - 04/15/21 1318      PT SHORT TERM GOAL #1   Title Patient will be I with initial HEP to progress in PT    Baseline provided at eval    Time 4    Period Weeks    Status New    Target Date 05/13/21      PT SHORT TERM GOAL #2   Title Patient will demonstrate > 5 deg dorsiflexion to improve gait    Baseline Right 0 deg, left 5 eg    Time 4    Period Weeks    Status New    Target Date 05/13/21      PT SHORT TERM GOAL #3   Title Patient will be able to walk >5 minutes using regular shoes with </= 6/10 pain level    Baseline only able to walk 2-3 minutes    Time 4    Period Weeks    Status New    Target Date 05/13/21             PT Long Term Goals - 04/15/21 1321      PT LONG TERM GOAL #1   Title Patient will be I with final HEP to maintain progress with PT and reduce pain    Time 8    Period Weeks    Status New    Target Date 06/10/21      PT LONG TERM GOAL #2   Title Patient will demonstrate ankle dorsflexion >/= 8 deg  to improve gait and stair negotiation    Time 8    Period Weeks    Status New    Target Date 06/10/21      PT LONG TERM GOAL #3   Title Patient will demonstrate >/= 4+/5 gross ankle strength to improve walking tolerance    Time 8    Period Weeks    Status New    Target Date 06/10/21      PT LONG TERM GOAL #4   Title Patient will be able to perform SLS >/=  15 sec bilaterally to improve balance and reduce fall risk    Time 8    Period Weeks    Status New    Target Date 06/10/21      PT LONG TERM GOAL #5   Title Patient will be able to walk >/= 20 minutes with needing to sit to improve community access and shopping    Time 8    Period Weeks    Status New    Target Date 06/10/21                  Plan - 04/15/21 1304    Clinical Impression Statement Patient presents to PT with report of long history of bilateral foot pain with numerous surgical interventions, most recently on 02/18/2021 for left foot removal of hardware (5th metatarsal); right foot 1st metatarsophalangeal joint replacement, hallux IPJ arthroplasty, 2nd/3rd metatarsal shortening osteotomy, 2nd/3rd hammertoe corrections with pin fixation, 5th metatarsal correction of tailor's bunion and 5th hammertoe correction. She is still ambulating in bilateral surgical shoes but can begin weaning into regular shoes at this time. She does report constant foot pain that limits all activit, she exhibits limitations in active range of motion and strength bilaterally with pain noted in all directions of resistance, gait and balance deficits, and observable swelling throughout right MTP joints. She was provided exercises to initiate active motion and ankles and feet, and instructed on swelling and pain management techniques. Patient would benefit from continued skilled PT to progress mobility and strength in order to reduce pain, improve walking, and maximize functional ability.    Personal Factors and Comorbidities Fitness;Past/Current  Experience;Time since onset of injury/illness/exacerbation;Comorbidity 3+    Comorbidities Chronic pain syndrome, depression, DM, HTN, BMI, numerous foot surgeries    Examination-Activity Limitations Locomotion Level;Stand;Bathing;Stairs;Sleep    Examination-Participation Restrictions Meal Prep;Cleaning;Community Activity;Shop    Stability/Clinical Decision Making Evolving/Moderate complexity    Clinical Decision Making Moderate    Rehab Potential Fair    PT Frequency 1x / week    PT Duration 8 weeks    PT Treatment/Interventions ADLs/Self Care Home Management;Aquatic Therapy;Electrical Stimulation;Cryotherapy;Iontophoresis 4mg /ml Dexamethasone;Moist Heat;Ultrasound;Neuromuscular re-education;Balance training;Therapeutic exercise;Therapeutic activities;Functional mobility training;Stair training;Gait training;Patient/family education;Manual techniques;Dry needling;Passive range of motion;Taping;Joint Manipulations    PT Next Visit Plan Review HEP and progress PRN, manual and stretching for ankle mobility and pain, progress AROM and initiate banded strengthening PRN, gait and balance training with regular shoes    PT Home Exercise Plan [email or web address]    Consulted and Agree with Plan of Care Patient           Patient will benefit from skilled therapeutic intervention in order to improve the following deficits and impairments:  Abnormal gait,Decreased range of motion,Difficulty walking,Pain,Decreased activity tolerance,Decreased balance,Impaired flexibility,Decreased strength,Increased edema  Visit Diagnosis: Pain in right foot  Pain in left foot  Stiffness of right ankle, not elsewhere classified  Stiffness of left ankle, not elsewhere classified  Muscle weakness (generalized)  Other abnormalities of gait and mobility     Problem List Patient Active Problem List   Diagnosis Date Noted  . Pain of left hand 01/01/2021  . Nasal vestibulitis 04/25/2020  . FH: CHF (congestive heart  failure) 04/05/2020  . Acquired hallux interphalangeus 02/26/2020  . Hallux rigidus 02/26/2020  . Retained orthopedic hardware 02/26/2020  . Mechanical complication associated with orthopedic device ([place]) 08/31/2019  . Hypercholesterolemia 08/17/2019  . Lateral epicondylitis of right elbow 03/31/2018  . Arthritis of knee 03/09/2018  . Arthritis of foot, degenerative 02/28/2018  .  Hallux valgus (acquired), left foot 02/28/2018  . Long term current use of oral hypoglycemic drug 01/16/2018  . Pharyngeal dysphagia 12/21/2017  . Left thyroid nodule 12/21/2017  . Cigarette smoker 12/21/2017  . History of colon polyps 09/21/2017  . History of atrial fibrillation 08/23/2017  . GERD (gastroesophageal reflux disease) 08/23/2017  . Presbyopia of both eyes 07/15/2017  . Nuclear sclerotic cataract of both eyes 07/15/2017  . Dry eye syndrome of both lacrimal glands 07/15/2017  . Acquired trigger finger of left ring finger 05/04/2017  . Prolonged Q-T interval on ECG 01/17/2017  . 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 Postural dizziness with presyncope 12/30/2016  . Paroxysmal atrial fibrillation ([place]) 12/30/2016  . Osteoarthritis 12/30/2016  . NSAID long-term use 12/30/2016  . Anemia 12/30/2016  . DDD (degenerative disc disease), lumbar 11/26/2015  . Facet syndrome, lumbar 11/26/2015  . Greater trochanteric bursitis 11/26/2015  . Diabetic neuropathy ([place]) 11/26/2015  . Insomnia 11/11/2015  . PTSD (post-traumatic stress disorder) 11/11/2015  . GAD (generalized anxiety disorder) 11/11/2015  . Severe episode of recurrent major depressive disorder, without psychotic features ([place]) 11/11/2015  . Depression 11/11/2015  . Cough 06/27/2012  . Obesity 06/27/2012  . Tobacco use 06/27/2012  . Sacroiliac joint dysfunction 05/18/2012  . Right low back pain 04/19/2012  . Cervical neck pain with evidence of disc disease 02/11/2012  . Trochanteric bursitis of right hip 02/11/2012  .  Plantar fasciitis 03/11/2011  . INSOMNIA 01/18/2008  . Chronic pain syndrome 12/15/2007  . DIABETIC PERIPHERAL NEUROPATHY 05/08/2007  . HEPATITIS C 02/27/2007  . DIABETES MELLITUS, TYPE II 02/27/2007  . DEPRESSION 02/27/2007  . HYPERTENSION 02/27/2007    Hilda Blades, PT, DPT, LAT, ATC 04/15/21  1:28 PM Phone: [phone] Fax: [facility] [address] [place], [place], [address] Phone: [phone]   Fax:  [phone]  Name: [name] MRN: [id number] Date of Birth: 07-03-1961

## 2021-04-15 NOTE — Patient Instructions (Signed)
Access Code: [email or web address] URL: [email or web address] Date: 04/15/2021 Prepared by: Hilda Blades  Exercises Seated Calf Stretch with Strap - 3 x daily - 7 x weekly - 3 reps - 20 hold Seated Heel Toe Raises - 3 x daily - 7 x weekly - 20 reps Ankle Inversion Eversion Towel Slide - 3 x daily - 7 x weekly - 20 reps Seated Toe Towel Scrunches - 3 x daily - 7 x weekly - 20 reps Ankle Pumps in Elevation - 3 x daily - 7 x weekly

## 2021-04-21 ENCOUNTER — Other Ambulatory Visit: Payer: Self-pay

## 2021-04-21 ENCOUNTER — Ambulatory Visit: Payer: Medicare Other

## 2021-04-21 DIAGNOSIS — M79671 Pain in right foot: Secondary | ICD-10-CM

## 2021-04-21 DIAGNOSIS — M6281 Muscle weakness (generalized): Secondary | ICD-10-CM

## 2021-04-21 DIAGNOSIS — M79672 Pain in left foot: Secondary | ICD-10-CM

## 2021-04-21 DIAGNOSIS — M25671 Stiffness of right ankle, not elsewhere classified: Secondary | ICD-10-CM

## 2021-04-21 DIAGNOSIS — R2689 Other abnormalities of gait and mobility: Secondary | ICD-10-CM

## 2021-04-21 DIAGNOSIS — M25672 Stiffness of left ankle, not elsewhere classified: Secondary | ICD-10-CM

## 2021-04-21 NOTE — Therapy (Addendum)
[place], [place], [address] Phone: [phone]   Fax:  [phone]  Physical Therapy Treatment / Discharge  Patient Details  Name: [name] MRN: [id number] Date of Birth: 22-Dec-1960 Referring Provider (PT): Evelina Bucy, DPM   Encounter Date: 04/21/2021   PT End of Session - 04/21/21 1133    Visit Number 2    Number of Visits 8    Date for PT Re-Evaluation 06/10/21    Authorization Type [employer] MCR    Progress Note Due on Visit 10    PT Start Time 1130    PT Stop Time 1210    PT Time Calculation (min) 40 min    Activity Tolerance Patient tolerated treatment well    Behavior During Therapy [facility] for tasks assessed/performed           Past Medical History:  Diagnosis Date  . Arthritis   . Cervicalgia   . Chondromalacia of patella   . Chronic neck pain   . Chronic pain syndrome   . Depression    secondary to loss of her son at age 56  . Diabetes mellitus   . Diabetic neuropathy ([place])   . DMII (diabetes mellitus, type 2) ([place])   . Enthesopathy of hip region   . Fibromyalgia   . Hep C w/o coma, chronic ([place])   . Hepatitis C    diagnosed 2005  . Hypertension   . Insomnia   . Insomnia   . Low back pain   . Lumbago   . Obesity   . Pain in joint, upper arm   . Primary localized osteoarthrosis, lower leg   . Substance abuse ([place])    sober since 2001  . Tobacco abuse   . 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 ABDOMINAL HYSTERECTOMY     at age 29, unknown reasons  . CARPAL TUNNEL RELEASE  jan 2013   left side  . FOOT SURGERY Bilateral     There were no vitals filed for this visit.   Subjective Assessment - 04/21/21 1134    Subjective Pt presents to PT with reports of continued R>L foot pain and discomfort. She has been compliant with her HEP with no adverse effect. Pt is ready to begin PT treatment at this time.     Currently in Pain? Yes    Pain Score 8     Pain Location Foot    Pain Orientation Right;Left   R>L                            [facility] Adult PT Treatment/Exercise - 04/21/21 0001      Ankle Exercises: Aerobic   Nustep lvl 5 LE only x 5 min while taking subjective      Additional Ankle Exercises DO NOT USE   Towel Crunch Limitations x 20 reps      Ankle Exercises: Seated   Towel Inversion/Eversion Limitations x 10 ea    Marble Pickup x 10 ea - difficult    Other Seated Ankle Exercises tennis ball massage rollouts to bilateral plantar fascia      Ankle Exercises: Supine   T-Band ankle inv/ev x 10 YTB ea                  PT Education - 04/21/21 1212    Education Details HEP  Person(s) Educated Patient    Methods Explanation;Demonstration;Handout    Comprehension Returned demonstration;Verbalized understanding            PT Short Term Goals - 04/15/21 1318      PT SHORT TERM GOAL #1   Title Patient will be I with initial HEP to progress in PT    Baseline provided at eval    Time 4    Period Weeks    Status New    Target Date 05/13/21      PT SHORT TERM GOAL #2   Title Patient will demonstrate > 5 deg dorsiflexion to improve gait    Baseline Right 0 deg, left 5 eg    Time 4    Period Weeks    Status New    Target Date 05/13/21      PT SHORT TERM GOAL #3   Title Patient will be able to walk >5 minutes using regular shoes with </= 6/10 pain level    Baseline only able to walk 2-3 minutes    Time 4    Period Weeks    Status New    Target Date 05/13/21             PT Long Term Goals - 04/15/21 1321      PT LONG TERM GOAL #1   Title Patient will be I with final HEP to maintain progress with PT and reduce pain    Time 8    Period Weeks    Status New    Target Date 06/10/21      PT LONG TERM GOAL #2   Title Patient will demonstrate ankle dorsflexion >/= 8 deg to improve gait and stair negotiation    Time 8    Period Weeks     Status New    Target Date 06/10/21      PT LONG TERM GOAL #3   Title Patient will demonstrate >/= 4+/5 gross ankle strength to improve walking tolerance    Time 8    Period Weeks    Status New    Target Date 06/10/21      PT LONG TERM GOAL #4   Title Patient will be able to perform SLS >/= 15 sec bilaterally to improve balance and reduce fall risk    Time 8    Period Weeks    Status New    Target Date 06/10/21      PT LONG TERM GOAL #5   Title Patient will be able to walk >/= 20 minutes with needing to sit to improve community access and shopping    Time 8    Period Weeks    Status New    Target Date 06/10/21                 Plan - 04/21/21 1256    Clinical Impression Statement Pt was able to complete prescribed exercises, but had difficulty with distal LE/ankle motor control, especially with marble pickup d/t swelling and limited ROM. She did respond well to self-directed tennis ball massage to bilateral medial longitudinal arches with no adverse effect. Incorporation of progressed distal LE strengthening exercises added to HEP. Pt continues to benefit from skilled PT services and should continue to be seen per POC as prescribed.    Personal Factors and Comorbidities Fitness;Past/Current Experience;Time since onset of injury/illness/exacerbation;Comorbidity 3+    Comorbidities Chronic pain syndrome, depression, DM, HTN, BMI, numerous foot surgeries    Examination-Activity Limitations Locomotion Level;Stand;Bathing;Stairs;Sleep  Examination-Participation Restrictions Meal Prep;Cleaning;Community Activity;Shop    Stability/Clinical Decision Making Evolving/Moderate complexity    Rehab Potential Fair    PT Frequency 1x / week    PT Duration 8 weeks    PT Treatment/Interventions ADLs/Self Care Home Management;Aquatic Therapy;Electrical Stimulation;Cryotherapy;Iontophoresis 4mg /ml Dexamethasone;Moist Heat;Ultrasound;Neuromuscular re-education;Balance training;Therapeutic  exercise;Therapeutic activities;Functional mobility training;Stair training;Gait training;Patient/family education;Manual techniques;Dry needling;Passive range of motion;Taping;Joint Manipulations    PT Next Visit Plan Review HEP and progress PRN, manual and stretching for ankle mobility and pain, progress AROM and initiate banded strengthening PRN, gait and balance training with regular shoes    PT Home Exercise Plan [id number]    Consulted and Agree with Plan of Care Patient           Patient will benefit from skilled therapeutic intervention in order to improve the following deficits and impairments:  Abnormal gait,Decreased range of motion,Difficulty walking,Pain,Decreased activity tolerance,Decreased balance,Impaired flexibility,Decreased strength,Increased edema  Visit Diagnosis: Pain in right foot  Pain in left foot  Stiffness of right ankle, not elsewhere classified  Stiffness of left ankle, not elsewhere classified  Muscle weakness (generalized)  Other abnormalities of gait and mobility     Problem List Patient Active Problem List   Diagnosis Date Noted  . Pain of left hand 01/01/2021  . Nasal vestibulitis 04/25/2020  . FH: CHF (congestive heart failure) 04/05/2020  . Acquired hallux interphalangeus 02/26/2020  . Hallux rigidus 02/26/2020  . Retained orthopedic hardware 02/26/2020  . Mechanical complication associated with orthopedic device ([place]) 08/31/2019  . Hypercholesterolemia 08/17/2019  . Lateral epicondylitis of right elbow 03/31/2018  . Arthritis of knee 03/09/2018  . Arthritis of foot, degenerative 02/28/2018  . Hallux valgus (acquired), left foot 02/28/2018  . Long term current use of oral hypoglycemic drug 01/16/2018  . Pharyngeal dysphagia 12/21/2017  . Left thyroid nodule 12/21/2017  . Cigarette smoker 12/21/2017  . History of colon polyps 09/21/2017  . History of atrial fibrillation 08/23/2017  . GERD (gastroesophageal reflux disease) 08/23/2017   . Presbyopia of both eyes 07/15/2017  . Nuclear sclerotic cataract of both eyes 07/15/2017  . Dry eye syndrome of both lacrimal glands 07/15/2017  . Acquired trigger finger of left ring finger 05/04/2017  . Prolonged Q-T interval on ECG 01/17/2017  . 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 Postural dizziness with presyncope 12/30/2016  . Paroxysmal atrial fibrillation ([place]) 12/30/2016  . Osteoarthritis 12/30/2016  . NSAID long-term use 12/30/2016  . Anemia 12/30/2016  . DDD (degenerative disc disease), lumbar 11/26/2015  . Facet syndrome, lumbar 11/26/2015  . Greater trochanteric bursitis 11/26/2015  . Diabetic neuropathy ([place]) 11/26/2015  . Insomnia 11/11/2015  . PTSD (post-traumatic stress disorder) 11/11/2015  . GAD (generalized anxiety disorder) 11/11/2015  . Severe episode of recurrent major depressive disorder, without psychotic features ([place]) 11/11/2015  . Depression 11/11/2015  . Cough 06/27/2012  . Obesity 06/27/2012  . Tobacco use 06/27/2012  . Sacroiliac joint dysfunction 05/18/2012  . Right low back pain 04/19/2012  . Cervical neck pain with evidence of disc disease 02/11/2012  . Trochanteric bursitis of right hip 02/11/2012  . Plantar fasciitis 03/11/2011  . INSOMNIA 01/18/2008  . Chronic pain syndrome 12/15/2007  . DIABETIC PERIPHERAL NEUROPATHY 05/08/2007  . HEPATITIS C 02/27/2007  . DIABETES MELLITUS, TYPE II 02/27/2007  . DEPRESSION 02/27/2007  . HYPERTENSION 02/27/2007    Ward Chatters, PT, DPT 04/21/21 12:59 PM  [facility] [address] [place], [place], [address] Phone: [phone]   Fax:  [phone]  Name:  Elna Radovich MRN: [id number] Date of Birth: 27-Oct-1961    PHYSICAL THERAPY DISCHARGE SUMMARY  Visits from Start of Care: 2  Current functional level related to goals / functional outcomes: See above   Remaining deficits: See above   Education / Equipment: HEP   Plan: Patient agrees to discharge.  Patient goals were not met. Patient is being discharged due to the patient's request.  ?????  Patient states she is having surgery.  Hilda Blades, PT, DPT, LAT, ATC 04/28/21  11:49 AM Phone: [phone] Fax: [phone]

## 2021-04-22 ENCOUNTER — Ambulatory Visit: Payer: Medicare Other | Admitting: Physical Therapy

## 2021-04-30 ENCOUNTER — Ambulatory Visit: Payer: Medicare Other | Admitting: Physical Therapy

## 2021-05-01 ENCOUNTER — Encounter: Payer: Medicare Other | Admitting: Podiatry

## 2021-05-07 ENCOUNTER — Encounter: Payer: Medicare Other | Admitting: Physical Therapy

## 2021-05-15 ENCOUNTER — Encounter: Payer: Medicare Other | Admitting: Physical Therapy

## 2021-06-25 ENCOUNTER — Other Ambulatory Visit: Payer: Self-pay | Admitting: Orthopaedic Surgery

## 2021-06-25 DIAGNOSIS — M79671 Pain in right foot: Secondary | ICD-10-CM

## 2021-07-14 ENCOUNTER — Ambulatory Visit
Admission: RE | Admit: 2021-07-14 | Discharge: 2021-07-14 | Disposition: A | Discharge status: Home / Self Care | Payer: Medicare Other | Source: Ambulatory Visit | Attending: Orthopaedic Surgery | Admitting: Orthopaedic Surgery

## 2021-07-14 DIAGNOSIS — M79671 Pain in right foot: Secondary | ICD-10-CM

## 2021-08-26 ENCOUNTER — Emergency Department (HOSPITAL_COMMUNITY)
Admission: EM | Admit: 2021-08-26 | Discharge: 2021-08-26 | Disposition: A | Discharge status: Home / Self Care | Payer: Medicare Other | Attending: Student | Admitting: Student

## 2021-08-26 ENCOUNTER — Encounter (HOSPITAL_COMMUNITY): Payer: Self-pay

## 2021-08-26 ENCOUNTER — Other Ambulatory Visit: Payer: Self-pay

## 2021-08-26 DIAGNOSIS — R509 Fever, unspecified: Secondary | ICD-10-CM | POA: Diagnosis not present

## 2021-08-26 DIAGNOSIS — R630 Anorexia: Secondary | ICD-10-CM | POA: Insufficient documentation

## 2021-08-26 DIAGNOSIS — R519 Headache, unspecified: Secondary | ICD-10-CM | POA: Diagnosis present

## 2021-08-26 DIAGNOSIS — I10 Essential (primary) hypertension: Secondary | ICD-10-CM | POA: Diagnosis not present

## 2021-08-26 DIAGNOSIS — Z7901 Long term (current) use of anticoagulants: Secondary | ICD-10-CM | POA: Diagnosis not present

## 2021-08-26 DIAGNOSIS — Z7984 Long term (current) use of oral hypoglycemic drugs: Secondary | ICD-10-CM | POA: Insufficient documentation

## 2021-08-26 DIAGNOSIS — E1140 Type 2 diabetes mellitus with diabetic neuropathy, unspecified: Secondary | ICD-10-CM | POA: Diagnosis not present

## 2021-08-26 DIAGNOSIS — Z794 Long term (current) use of insulin: Secondary | ICD-10-CM | POA: Diagnosis not present

## 2021-08-26 DIAGNOSIS — Z20822 Contact with and (suspected) exposure to covid-19: Secondary | ICD-10-CM | POA: Diagnosis not present

## 2021-08-26 DIAGNOSIS — F17210 Nicotine dependence, cigarettes, uncomplicated: Secondary | ICD-10-CM | POA: Insufficient documentation

## 2021-08-26 DIAGNOSIS — Z7982 Long term (current) use of aspirin: Secondary | ICD-10-CM | POA: Diagnosis not present

## 2021-08-26 DIAGNOSIS — Z79899 Other long term (current) drug therapy: Secondary | ICD-10-CM | POA: Diagnosis not present

## 2021-08-26 LAB — COMPREHENSIVE METABOLIC PANEL
ALT: 12 U/L (ref 0–44)
AST: 20 U/L (ref 15–41)
Albumin: 4.1 g/dL (ref 3.5–5.0)
Alkaline Phosphatase: 57 U/L (ref 38–126)
Anion gap: 11 (ref 5–15)
BUN: 14 mg/dL (ref 8–23)
CO2: 23 mmol/L (ref 22–32)
Calcium: 8.1 mg/dL — ABNORMAL LOW (ref 8.9–10.3)
Chloride: 104 mmol/L (ref 98–111)
Creatinine, Ser: 1.16 mg/dL — ABNORMAL HIGH (ref 0.44–1.00)
GFR, Estimated: 54 mL/min — ABNORMAL LOW (ref >60)
Glucose, Bld: 96 mg/dL (ref 70–99)
Potassium: 3.8 mmol/L (ref 3.5–5.1)
Sodium: 138 mmol/L (ref 135–145)
Total Bilirubin: 0.6 mg/dL (ref 0.3–1.2)
Total Protein: 7.4 g/dL (ref 6.5–8.1)

## 2021-08-26 LAB — CBC WITH DIFFERENTIAL/PLATELET
Abs Immature Granulocytes: 0.03 10*3/uL (ref 0.00–0.07)
Basophils Absolute: 0.1 10*3/uL (ref 0.0–0.1)
Basophils Relative: 1 %
Eosinophils Absolute: 0.1 10*3/uL (ref 0.0–0.5)
Eosinophils Relative: 1 %
HCT: 40.8 % (ref 36.0–46.0)
Hemoglobin: 13.9 g/dL (ref 12.0–15.0)
Immature Granulocytes: 1 %
Lymphocytes Relative: 30 %
Lymphs Abs: 1.7 10*3/uL (ref 0.7–4.0)
MCH: 30.4 pg (ref 26.0–34.0)
MCHC: 34.1 g/dL (ref 30.0–36.0)
MCV: 89.3 fL (ref 80.0–100.0)
Monocytes Absolute: 0.4 10*3/uL (ref 0.1–1.0)
Monocytes Relative: 7 %
Neutro Abs: 3.4 10*3/uL (ref 1.7–7.7)
Neutrophils Relative %: 60 %
Platelets: 234 10*3/uL (ref 150–400)
RBC: 4.57 MIL/uL (ref 3.87–5.11)
RDW: 13.5 % (ref 11.5–15.5)
WBC: 5.7 10*3/uL (ref 4.0–10.5)
nRBC: 0 % (ref 0.0–0.2)

## 2021-08-26 LAB — RESP PANEL BY RT-PCR (FLU A&B, COVID) ARPGX2
Influenza A by PCR: NEGATIVE
Influenza B by PCR: NEGATIVE
SARS Coronavirus 2 by RT PCR: NEGATIVE

## 2021-08-26 NOTE — ED Notes (Signed)
Pt's R big toe redressed and r ankle ace wrapped per ERP.

## 2021-08-26 NOTE — ED Triage Notes (Signed)
Pt reports going to dentist for filling and sending to ER for HTN 199/122. Patient reports nausea, dizziness, lightheaded, fatigue x2 days. On HTN meds but hasn't took them for 2 days due to nausea

## 2021-08-26 NOTE — ED Provider Notes (Signed)
Emergency Medicine Provider Triage Evaluation Note  [name] , a 60 y.o. female  was evaluated in triage.  Pt complains of lightheadedness.  Patient states for the past few days she has been experiencing fatigue, lightheadedness, dizziness, as well as nausea.  Due to her nausea she has had decreased [name].o. intake and stopped taking her regular medications.  While at the dentist office today she was noted to have an elevated blood pressure and was sent to the emergency department for further evaluation.  Denies visual changes, chest pain, shortness of breath.  Physical Exam  BP (!) 147/111 (BP Location: Left Arm)   Pulse 68   Temp (!) 97.3 F (36.3 C)   Resp 16   Ht 5\' 11"  (1.803 m)   Wt 135 kg   LMP 12/06/1976   SpO2 99%   BMI 41.51 kg/m  Gen:   Awake, no distress   Resp:  Normal effort  MSK:   Moves extremities without difficulty  Other:    Medical Decision Making  Medically screening exam initiated at 3:20 PM.  Appropriate orders placed.  [name] was informed that the remainder of the evaluation will be completed by another provider, this initial triage assessment does not replace that evaluation, and the importance of remaining in the ED until their evaluation is complete.   Nicole Sexton, PA-C 58/85/02 [id number]    [place], [place] P, DO 28/78/67 [phone]

## 2021-08-26 NOTE — Discharge Instructions (Addendum)
You were seen and evaluated in the emergency department today for further evaluation of high blood pressure and feeling unwell.  As we discussed, there is not appear to be signs of infection or other complications to your high blood pressure.  Your symptoms are likely due to your high blood pressure.  Not taking your medications for hypertension and diabetes.  I would recommend restarting your medications.  Please follow-up with your primary care provider within the next week for further evaluation.  Please return to the emergency department if you are experiencing new and severe chest pain, severe shortness of breath, loss of consciousness, weakness/numbness to upper or lower extremity, or any other concerns you might have.

## 2021-08-26 NOTE — ED Provider Notes (Signed)
[facility] DEPT Provider Note   CSN: [id number] Arrival date & time: 08/26/21  1458     History Chief Complaint  Patient presents with   Hypertension    [name] is a 60 y.o. female with history of hypertension and diabetes who presents to the emergency department for further evaluation of "feeling bad" that began 3 days ago.  She states that she had associated loss of appetite, headache, and dizziness.  Dizziness has been intermittent.  She describes her dizziness as a lightheaded sensation like she wants to pass out. She stopped taking her antihypertensive medications and diabetes medications secondary to loss of appetite.  She recently underwent a right great toe bunionectomy 1 week ago.  She also complains of subjective fever and chills.  She denies any chest pain, shortness of breath, abdominal pain, nausea, vomiting, diarrhea, leg pain, leg swelling, visual disturbances, syncope.  Chills improved prior to arrival with Tylenol.  The history is provided by the patient. No language interpreter was used.  Hypertension      Past Medical History:  Diagnosis Date   Arthritis    Cervicalgia    Chondromalacia of patella    Chronic neck pain    Chronic pain syndrome    Depression    secondary to loss of her son at age 62   Diabetes mellitus    Diabetic neuropathy ([place])    DMII (diabetes mellitus, type 2) ([place])    Enthesopathy of hip region    Fibromyalgia    Hep C w/o coma, chronic ([place])    Hepatitis C    diagnosed 2005   Hypertension    Insomnia    Insomnia    Low back pain    Lumbago    Obesity    Pain in joint, upper arm    Primary localized osteoarthrosis, lower leg    Substance abuse ([place])    sober since 2001   Tobacco abuse    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    Patient Active Problem List   Diagnosis Date Noted   Pain of left hand 01/01/2021   Nasal vestibulitis 04/25/2020   FH: CHF (congestive heart  failure) 04/05/2020   Acquired hallux interphalangeus 02/26/2020   Hallux rigidus 02/26/2020   Retained orthopedic hardware 02/26/2020   Mechanical complication associated with orthopedic device ([place]) 08/31/2019   Hypercholesterolemia 08/17/2019   Lateral epicondylitis of right elbow 03/31/2018   Arthritis of knee 03/09/2018   Arthritis of foot, degenerative 02/28/2018   Hallux valgus (acquired), left foot 02/28/2018   Long term current use of oral hypoglycemic drug 01/16/2018   Pharyngeal dysphagia 12/21/2017   Left thyroid nodule 12/21/2017   Cigarette smoker 12/21/2017   History of colon polyps 09/21/2017   History of atrial fibrillation 08/23/2017   GERD (gastroesophageal reflux disease) 08/23/2017   Presbyopia of both eyes 07/15/2017   Nuclear sclerotic cataract of both eyes 07/15/2017   Dry eye syndrome of both lacrimal glands 07/15/2017   Acquired trigger finger of left ring finger 05/04/2017   Prolonged Q-T interval on ECG 01/17/2017   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/11/2015   Depression 11/11/2015   Cough 06/27/2012   Obesity 06/27/2012   Tobacco use 06/27/2012   Sacroiliac joint dysfunction 05/18/2012   Right low back pain 04/19/2012   Cervical neck pain with evidence of disc disease 02/11/2012   Trochanteric bursitis of right hip 02/11/2012   Plantar fasciitis 03/11/2011   INSOMNIA  01/18/2008   Chronic pain syndrome 12/15/2007   DIABETIC PERIPHERAL NEUROPATHY 05/08/2007   HEPATITIS C 02/27/2007   DIABETES MELLITUS, TYPE II 02/27/2007   DEPRESSION 02/27/2007   HYPERTENSION 02/27/2007    Past Surgical History:  Procedure Laterality Date   ABDOMINAL HYSTERECTOMY     at age 75, unknown reasons   CARPAL TUNNEL RELEASE  jan 2013   left side   FOOT SURGERY Bilateral      OB History     Gravida  2   Para  2   Term      Preterm      AB      Living         SAB      IAB      Ectopic      Multiple      Live Births              Family History  Problem Relation Age of Onset   Uterine cancer Mother    Alcohol abuse Father    Alcohol abuse Sister    Drug abuse Sister    Drug abuse Brother    Alcohol abuse Brother    Schizophrenia Son    Diabetes Other    Arthritis Other    Hypertension Other    Heart attack Son 11       Died suddenly    Social History   Tobacco Use   Smoking status: Every Day    Packs/day: 0.50    Years: 35.00    Pack years: 17.50    Types: Cigarettes   Smokeless tobacco: Never  Vaping Use   Vaping Use: Never used  Substance Use Topics   Alcohol use: No   Drug use: No    Types: "Crack" cocaine    Comment: last used Oct 2016    Home Medications Prior to Admission medications   Medication Sig Start Date End Date Taking? Authorizing Provider  albuterol (PROVENTIL HFA;VENTOLIN HFA) 108 (90 Base) MCG/ACT inhaler Inhale 2 puffs into the lungs every 6 (six) hours as needed for wheezing or shortness of breath.    Yes [provider]  aspirin 81 MG EC tablet Take by mouth.   Yes [provider]  atorvastatin (LIPITOR) 20 MG tablet Take 20 mg by mouth daily.   Yes [provider]  baclofen (LIORESAL) 10 MG tablet baclofen 10 mg tablet   Yes [provider]  buPROPion (ZYBAN) 150 MG 12 hr tablet Take 150 mg by mouth 2 (two) times daily. 02/06/20  Yes [provider]   Cholecalciferol 25 MCG (1000 UT) tablet Take by mouth.   Yes [provider]  Cyanocobalamin (VITAMIN B 12 PO) Take 50 mcg by mouth.   Yes [provider]  diltiazem (CARDIZEM CD) 120 MG 24 hr capsule Take 120 mg by mouth daily.   Yes [provider]  DULoxetine (CYMBALTA) 60 MG capsule TAKE 2 CAPSULE BY MOUTH EVERY DAY Patient taking differently: Take 60 mg by mouth 2 (  two) times daily. 02/01/19  Yes [name], MD  ergocalciferol (VITAMIN D2) 50000 UNITS capsule Take 50,000 Units by mouth every Sunday.    Yes [provider]  estrogens, conjugated, (PREMARIN) 0.3 MG tablet Take 0.3 mg by mouth daily. Take daily for 21 days then do not take for 7 days.   Yes [provider]  ferrous sulfate 325 (65 FE) MG tablet Take 325 mg by mouth daily with breakfast.   Yes [provider]  hydroxypropyl methylcellulose / hypromellose (ISOPTO TEARS / GONIOVISC) 2.5 % ophthalmic solution Place 1 drop into both eyes 3 (three) times daily as needed for dry eyes.   Yes [provider]  hydrOXYzine (ATARAX/VISTARIL) 25 MG tablet Take 25 mg by mouth 3 (three) times daily. 12/21/18  Yes [provider]  linagliptin (TRADJENTA) 5 MG TABS tablet Take 5 mg by mouth daily.   Yes [provider]  losartan (COZAAR) 100 MG tablet Take 100 mg by mouth daily. 09/14/20  Yes [provider]  lubiprostone (AMITIZA) 24 MCG capsule Take 24 mcg by mouth 2 (two) times daily.   Yes [provider]  metFORMIN (GLUCOPHAGE) 500 MG tablet Take 500 mg by mouth daily with breakfast.   Yes [provider]  metoprolol tartrate (LOPRESSOR) 25 MG tablet Take 25 mg by mouth 2 (two) times daily.   Yes [provider]  naloxone (NARCAN) nasal spray 4 mg/0.1 mL Place 1 spray into the nose.   Yes [provider]  potassium chloride (MICRO-K) 10 MEQ CR capsule Take 20 mEq by mouth daily.   Yes [provider]   Semaglutide,0.25 or 0.5MG /DOS, (OZEMPIC, 0.25 OR 0.5 MG/DOSE,) 2 MG/1.5ML SOPN Inject 0.5 mg into the skin once a week. 07/18/19  Yes [provider]  ACCU-CHEK FASTCLIX LANCETS MISC 1 each by Does not apply route daily. Check blood sugar once daily. 250.00 10/26/11   [name], DO  Calcium Carbonate (CALCARB 600) 1500 MG TABS Take 1 tablet (1,500 mg total) by mouth 2 (two) times daily. Patient not taking: No sig reported 10/25/11   Burman Freestone, MD  calcium-vitamin D (OSCAL WITH D) 500-200 MG-UNIT TABS tablet Take by mouth. 12/19/20 03/19/21  [provider]  cephALEXin (KEFLEX) 500 MG capsule Take 1 capsule (500 mg total) by mouth 2 (two) times daily. Patient not taking: No sig reported 02/18/21   Evelina Bucy, DPM  cyclobenzaprine (FLEXERIL) 5 MG tablet Take 1 tablet (5 mg total) by mouth at bedtime. Patient not taking: No sig reported 08/26/18   Landis Martins, DPM  doxycycline (VIBRA-TABS) 100 MG tablet Take 1 tablet (100 mg total) by mouth 2 (two) times daily. Patient not taking: No sig reported 05/02/19   Edrick Kins, DPM  glucose blood (ACCU-CHEK SMARTVIEW) test strip Check blood sugar once daily. 250.00 10/26/11 10/25/12  [name], DO  ibuprofen (ADVIL,MOTRIN) 800 MG tablet TAKE 1 TABLET(800 MG) BY MOUTH EVERY 8 HOURS AS NEEDED FOR MILD PAIN OR MODERATE PAIN Patient not taking: No sig reported 02/09/19   Edrick Kins, DPM  lidocaine (LIDODERM) 5 % Place 1 patch onto the skin daily. Remove & Discard patch within 12 hours or as directed by MD Patient not taking: No sig reported 09/11/20   Lamptey, Myrene Galas, MD  mupirocin ointment (BACTROBAN) 2 % Apply topically 3 (three) times daily. Patient not taking: No sig reported 11/22/12   Janell Quiet, MD  omeprazole (PRILOSEC) 20 MG capsule Take 1 capsule (  20 mg total) by mouth daily. Patient not taking: No sig reported 06/07/12   Bartholomew Crews, MD  ondansetron (ZOFRAN) 4 MG tablet Take 1 tablet  (4 mg total) by mouth every 8 (eight) hours as needed for nausea or vomiting. Patient not taking: No sig reported 02/18/21   Evelina Bucy, DPM  oxyCODONE (ROXICODONE) 15 MG immediate release tablet Take 1 tablet (15 mg total) by mouth every 4 (four) hours as needed for pain. Patient not taking: No sig reported 04/27/19   Tegeler, Gwenyth Allegra, MD  oxyCODONE-acetaminophen (PERCOCET) 10-325 MG tablet Take 1 tablet by mouth every 4 (four) hours as needed for pain. Patient not taking: No sig reported 02/18/21   Evelina Bucy, DPM  piroxicam (FELDENE) 10 MG capsule TAKE 1 CAPSULE BY MOUTH DAILY Patient not taking: No sig reported 06/06/12   Milta Deiters, MD  silver sulfADIAZINE (SILVADENE) 1 % cream Apply 1 application topically daily. Patient not taking: No sig reported 07/17/19   Edrick Kins, DPM  traZODone (DESYREL) 100 MG tablet Take 2.5 tablets (250 mg total) by mouth at bedtime as needed for sleep. Patient not taking: No sig reported 11/23/18   [name], MD  rivaroxaban (XARELTO) 20 MG TABS tablet Take 20 mg by mouth daily with supper.  09/11/20  [provider]    Allergies    Hydrocodone-acetaminophen and Hydrocodone  Review of Systems   Review of Systems  All other systems reviewed and are negative.  Physical Exam Updated Vital Signs BP (!) 152/133   Pulse 80   Temp (!) 97.3 F (36.3 C)   Resp 18   Ht 5\' 11"  (1.803 m)   Wt 135 kg   LMP 12/06/1976   SpO2 99%   BMI 41.51 kg/m   Physical Exam Constitutional:      General: She is not in acute distress.    Appearance: Normal appearance.  HENT:     Head: Normocephalic and atraumatic.  Eyes:     General:        Right eye: No discharge.        Left eye: No discharge.  Cardiovascular:     Comments: Regular rate and rhythm.  S1/S2 are distinct without any evidence of murmur, rubs, or gallops.  Radial pulses are 2+ bilaterally.  Dorsalis pedis pulses are 2+ bilaterally.  No evidence of pedal  edema. Pulmonary:     Comments: Clear to auscultation bilaterally.  Normal effort.  No respiratory distress.  No evidence of wheezes, rales, or rhonchi heard throughout. Abdominal:     General: Abdomen is flat. Bowel sounds are normal. There is no distension.     Tenderness: There is no guarding or rebound.     Comments: Mild lower abdominal tenderness.  Musculoskeletal:        General: Normal range of motion.     Cervical back: Neck supple.  Skin:    General: Skin is warm and dry.     Findings: No rash.     Comments: There are sutures in place over the right great toe.  There does not appear to be any surrounding erythema or obvious purulence.  Wound appears to be healing well.  There is a 3 mm stage I ulcer to the right lower abdomen.  No obvious purulence or surrounding erythema  Neurological:     General: No focal deficit present.     Mental Status: She is alert.  Psychiatric:        Mood  and Affect: Mood normal.        Behavior: Behavior normal.    ED Results / Procedures / Treatments   Labs (all labs ordered are listed, but only abnormal results are displayed) Labs Reviewed  COMPREHENSIVE METABOLIC PANEL - Abnormal; Notable for the following components:      Result Value   Creatinine, Ser 1.16 (*)    Calcium 8.1 (*)    GFR, Estimated 54 (*)    All other components within normal limits  RESP PANEL BY RT-PCR (FLU A&B, COVID) ARPGX2  CBC WITH DIFFERENTIAL/PLATELET    EKG None  Radiology No results found.  Procedures Procedures   Medications Ordered in ED Medications - No data to display  ED Course  I have reviewed the triage vital signs and the nursing notes.  Pertinent labs & imaging results that were available during my care of the patient were reviewed by me and considered in my medical decision making (see chart for details).    MDM Rules/Calculators/A&P                          [name] is a 60 y.o. female with history of diabetes and  hypertension who presents to the emergency department today for further evaluation of high blood pressure and feeling poorly.  Her signs and symptoms are consistent with high blood pressure.  This correlates with her not taking her medications over the last few days. There does not appear to be any endorgan damage on her lab work.  COVID was negative.  There does not appear to be any other source that would appear to be making her feel unwell at this time.  Believe she is safe for discharge.  Strict return precautions given.  I will have her follow-up with her primary care provider.   Final Clinical Impression(s) / ED Diagnoses Final diagnoses:  Primary hypertension    Rx / DC Orders ED Discharge Orders     None        Cherrie Gauze 08/26/21 2250    Kseniya Lower, MD 08/27/21 0001

## 2021-12-05 ENCOUNTER — Emergency Department (HOSPITAL_COMMUNITY): Payer: Medicare Other

## 2021-12-05 ENCOUNTER — Emergency Department (HOSPITAL_COMMUNITY)
Admission: EM | Admit: 2021-12-05 | Discharge: 2021-12-05 | Disposition: A | Discharge status: Home / Self Care | Payer: Medicare Other | Attending: Emergency Medicine | Admitting: Emergency Medicine

## 2021-12-05 ENCOUNTER — Other Ambulatory Visit: Payer: Self-pay

## 2021-12-05 ENCOUNTER — Encounter (HOSPITAL_COMMUNITY): Payer: Self-pay

## 2021-12-05 DIAGNOSIS — I10 Essential (primary) hypertension: Secondary | ICD-10-CM | POA: Insufficient documentation

## 2021-12-05 DIAGNOSIS — R072 Precordial pain: Secondary | ICD-10-CM | POA: Insufficient documentation

## 2021-12-05 DIAGNOSIS — D649 Anemia, unspecified: Secondary | ICD-10-CM | POA: Diagnosis not present

## 2021-12-05 DIAGNOSIS — M797 Fibromyalgia: Secondary | ICD-10-CM | POA: Diagnosis not present

## 2021-12-05 DIAGNOSIS — R52 Pain, unspecified: Secondary | ICD-10-CM

## 2021-12-05 LAB — TROPONIN I (HIGH SENSITIVITY)
Troponin I (High Sensitivity): 7 ng/L (ref <18)
Troponin I (High Sensitivity): 8 ng/L (ref <18)

## 2021-12-05 LAB — CBC
HCT: 29.8 % — ABNORMAL LOW (ref 36.0–46.0)
Hemoglobin: 9.9 g/dL — ABNORMAL LOW (ref 12.0–15.0)
MCH: 31 pg (ref 26.0–34.0)
MCHC: 33.2 g/dL (ref 30.0–36.0)
MCV: 93.4 fL (ref 80.0–100.0)
Platelets: 157 10*3/uL (ref 150–400)
RBC: 3.19 MIL/uL — ABNORMAL LOW (ref 3.87–5.11)
RDW: 13.1 % (ref 11.5–15.5)
WBC: 6 10*3/uL (ref 4.0–10.5)
nRBC: 0 % (ref 0.0–0.2)

## 2021-12-05 LAB — LIPASE, BLOOD: Lipase: 53 U/L — ABNORMAL HIGH (ref 11–51)

## 2021-12-05 LAB — COMPREHENSIVE METABOLIC PANEL
ALT: 9 U/L (ref 0–44)
AST: 12 U/L — ABNORMAL LOW (ref 15–41)
Albumin: 3.5 g/dL (ref 3.5–5.0)
Alkaline Phosphatase: 50 U/L (ref 38–126)
Anion gap: 7 (ref 5–15)
BUN: 10 mg/dL (ref 8–23)
CO2: 23 mmol/L (ref 22–32)
Calcium: 7.9 mg/dL — ABNORMAL LOW (ref 8.9–10.3)
Chloride: 105 mmol/L (ref 98–111)
Creatinine, Ser: 0.98 mg/dL (ref 0.44–1.00)
GFR, Estimated: >60 mL/min (ref >60)
Glucose, Bld: 153 mg/dL — ABNORMAL HIGH (ref 70–99)
Potassium: 3.7 mmol/L (ref 3.5–5.1)
Sodium: 135 mmol/L (ref 135–145)
Total Bilirubin: 0.4 mg/dL (ref 0.3–1.2)
Total Protein: 6.1 g/dL — ABNORMAL LOW (ref 6.5–8.1)

## 2021-12-05 LAB — CBG MONITORING, ED: Glucose-Capillary: 106 mg/dL — ABNORMAL HIGH (ref 70–99)

## 2021-12-05 MED ORDER — ONDANSETRON HCL 4 MG/2ML IJ SOLN
4.0000 mg | Freq: Once | INTRAMUSCULAR | Status: AC
  Administered 2021-12-05: 4 mg via INTRAVENOUS
  Filled 2021-12-05: qty 2

## 2021-12-05 MED ORDER — MORPHINE SULFATE (PF) 4 MG/ML IV SOLN
4.0000 mg | Freq: Once | INTRAVENOUS | Status: AC
  Administered 2021-12-05: 4 mg via INTRAVENOUS
  Filled 2021-12-05: qty 1

## 2021-12-05 MED ORDER — METOPROLOL TARTRATE 25 MG PO TABS
25.0000 mg | ORAL_TABLET | Freq: Once | ORAL | Status: AC
  Administered 2021-12-05: 25 mg via ORAL
  Filled 2021-12-05: qty 1

## 2021-12-05 MED ORDER — KETOROLAC TROMETHAMINE 15 MG/ML IJ SOLN
15.0000 mg | Freq: Once | INTRAMUSCULAR | Status: AC
  Administered 2021-12-05: 15 mg via INTRAVENOUS
  Filled 2021-12-05: qty 1

## 2021-12-05 MED ORDER — LOSARTAN POTASSIUM 50 MG PO TABS
100.0000 mg | ORAL_TABLET | Freq: Once | ORAL | Status: AC
  Administered 2021-12-05: 100 mg via ORAL
  Filled 2021-12-05: qty 2

## 2021-12-05 NOTE — ED Provider Notes (Signed)
Emergency Medicine Provider Triage Evaluation Note  Skyelynn Rambeau , a 60 y.o. female  was evaluated in triage.  Pt complains of chest pain that woke her up from sleep. C/o left arm pain as well.  Review of Systems  Positive: Chest pain, left arm pain Negative: Sob, nv, abd pain, diaphoresis  Physical Exam  BP (!) 158/105    Pulse 79    Temp 97.8 F (36.6 C) (Oral)    Resp 18    LMP 12/06/1976    SpO2 98%  Gen:   Awake, no distress   Resp:  Normal effort  MSK:   Moves extremities without difficulty  Other:  Heart with rrr, murmur present. Lungs ctab  Medical Decision Making  Medically screening exam initiated at 4:44 AM.  Appropriate orders placed.  Najmah Carradine was informed that the remainder of the evaluation will be completed by another provider, this initial triage assessment does not replace that evaluation, and the importance of remaining in the ED until their evaluation is complete.     Rodney Booze, PA-C 12/05/21 0445    Veryl Speak, MD 12/05/21 [phone]

## 2021-12-05 NOTE — ED Triage Notes (Signed)
Pt reports acute onset of centralized chest pain radiating left arm onset 5 hrs PTA.

## 2021-12-05 NOTE — ED Notes (Addendum)
Pt continuously keeps asking where her name lies on the list. Pt is currently agitated claiming she needs her cigarettes. Educated patient on [employer].

## 2021-12-05 NOTE — ED Notes (Signed)
Patient refused dc vitals signs

## 2021-12-05 NOTE — ED Provider Notes (Signed)
[facility] EMERGENCY DEPARTMENT Provider Note   CSN: [id number] Arrival date & time: 12/05/21  0436     History Chief Complaint  Patient presents with   Chest Pain    [name] is a 60 y.o. female.  Patient states hx fibromyalgia and has chronic pain issues - states last night, pain in trapezius area, as well as occasionally notes sob when lies flat. No pnd. States mid chest pain at rest last night, dull, at rest, mild, not pleuritic, non radiating, midline/mid to lower chest. No associated sob, nv or diaphoresis. No exertional cp or discomfort. Denies fam hx premature cad, or personal hx cad. No leg pain or swelling. No recent surgery, trauma, travel or immobility. No hx dvt or pe. ?hx gerd. Denies severe headaches, had mild headache earlier. No neck pain or stiffness. No abd pain or nvd. No dysuria or gu c/o. Is requesting pain medication via iv line placed earlier as has been waiting in ED for long while.    The history is provided by the patient and medical records.  Chest Pain Associated symptoms: no abdominal pain, no cough, no fever, no numbness, no palpitations, no vomiting and no weakness       Past Medical History:  Diagnosis Date   Arthritis    Cervicalgia    Chondromalacia of patella    Chronic neck pain    Chronic pain syndrome    Depression    secondary to loss of her son at age 51   Diabetes mellitus    Diabetic neuropathy ([place])    DMII (diabetes mellitus, type 2) ([place])    Enthesopathy of hip region    Fibromyalgia    Hep C w/o coma, chronic ([place])    Hepatitis C    diagnosed 2005   Hypertension    Insomnia    Insomnia    Low back pain    Lumbago    Obesity    Pain in joint, upper arm    Primary localized osteoarthrosis, lower leg    Substance abuse ([place])    sober since 2001   Tobacco abuse    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    Patient Active Problem List   Diagnosis Date Noted   Pain of left hand  01/01/2021   Nasal vestibulitis 04/25/2020   FH: CHF (congestive heart failure) 04/05/2020   Acquired hallux interphalangeus 02/26/2020   Hallux rigidus 02/26/2020   Retained orthopedic hardware 02/26/2020   Mechanical complication associated with orthopedic device ([place]) 08/31/2019   Hypercholesterolemia 08/17/2019   Lateral epicondylitis of right elbow 03/31/2018   Arthritis of knee 03/09/2018   Arthritis of foot, degenerative 02/28/2018   Hallux valgus (acquired), left foot 02/28/2018   Long term current use of oral hypoglycemic drug 01/16/2018   Pharyngeal dysphagia 12/21/2017   Left thyroid nodule 12/21/2017   Cigarette smoker 12/21/2017   History of colon polyps 09/21/2017   History of atrial fibrillation 08/23/2017   GERD (gastroesophageal reflux disease) 08/23/2017   Presbyopia of both eyes 07/15/2017   Nuclear sclerotic cataract of both eyes 07/15/2017   Dry eye syndrome of both lacrimal glands 07/15/2017   Acquired trigger finger of left ring finger 05/04/2017   Prolonged Q-T interval on ECG 01/17/2017   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 01/17/2017   Postural dizziness with presyncope 12/30/2016   Paroxysmal atrial fibrillation ([place]) 12/30/2016   Osteoarthritis 12/30/2016   NSAID long-term use 12/30/2016   Anemia 12/30/2016   DDD (degenerative disc disease),  lumbar 11/26/2015   Facet syndrome, lumbar 11/26/2015   Greater trochanteric bursitis 11/26/2015   Diabetic neuropathy ([place]) 11/26/2015   Insomnia 11/11/2015   PTSD (post-traumatic stress disorder) 11/11/2015   GAD (generalized anxiety disorder) 11/11/2015   Severe episode of recurrent major depressive disorder, 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/11/2015   Depression 11/11/2015   Cough 06/27/2012   Obesity 06/27/2012   Tobacco use 06/27/2012   Sacroiliac joint dysfunction 05/18/2012   Right low back pain 04/19/2012   Cervical neck pain with evidence of disc disease 02/11/2012   Trochanteric bursitis of  right hip 02/11/2012   Plantar fasciitis 03/11/2011   INSOMNIA 01/18/2008   Chronic pain syndrome 12/15/2007   DIABETIC PERIPHERAL NEUROPATHY 05/08/2007   HEPATITIS C 02/27/2007   DIABETES MELLITUS, TYPE II 02/27/2007   DEPRESSION 02/27/2007   HYPERTENSION 02/27/2007    Past Surgical History:  Procedure Laterality Date   ABDOMINAL HYSTERECTOMY     at age 83, unknown reasons   CARPAL TUNNEL RELEASE  jan 2013   left side   FOOT SURGERY Bilateral      OB History     Gravida  2   Para  2   Term      Preterm      AB      Living         SAB      IAB      Ectopic      Multiple      Live Births              Family History  Problem Relation Age of Onset   Uterine cancer Mother    Alcohol abuse Father    Alcohol abuse Sister    Drug abuse Sister    Drug abuse Brother    Alcohol abuse Brother    Schizophrenia Son    Diabetes Other    Arthritis Other    Hypertension Other    Heart attack Son 43       Died suddenly    Social History   Tobacco Use   Smoking status: Every Day    Packs/day: 0.50    Years: 35.00    Pack years: 17.50    Types: Cigarettes   Smokeless tobacco: Never  Vaping Use   Vaping Use: Never used  Substance Use Topics   Alcohol use: No   Drug use: No    Types: "Crack" cocaine    Comment: last used Oct 2016    Home Medications Prior to Admission medications   Medication Sig Start Date End Date Taking? Authorizing Provider  ACCU-CHEK FASTCLIX LANCETS MISC 1 each by Does not apply route daily. Check blood sugar once daily. 250.00 10/26/11   [name], DO  albuterol (PROVENTIL HFA;VENTOLIN HFA) 108 (90 Base) MCG/ACT inhaler Inhale 2 puffs into the lungs every 6 (six) hours as needed for wheezing or shortness of breath.     [provider]  aspirin 81 MG EC tablet Take by mouth.    [provider]  atorvastatin (LIPITOR) 20 MG tablet Take 20 mg by mouth daily.    [provider]  baclofen  (LIORESAL) 10 MG tablet baclofen 10 mg tablet    [provider]  buPROPion (ZYBAN) 150 MG 12 hr tablet Take 150 mg by mouth 2 (two) times daily. 02/06/20   [provider]  Calcium Carbonate (CALCARB 600) 1500 MG TABS Take 1 tablet (1,500 mg total) by mouth 2 (two) times daily.  Patient not taking: No sig reported 10/25/11   Burman Freestone, MD  calcium-vitamin D (OSCAL WITH D) 500-200 MG-UNIT TABS tablet Take by mouth. 12/19/20 03/19/21  [provider]  cephALEXin (KEFLEX) 500 MG capsule Take 1 capsule (500 mg total) by mouth 2 (two) times daily. Patient not taking: No sig reported 02/18/21   Evelina Bucy, DPM  Cholecalciferol 25 MCG (1000 UT) tablet Take by mouth.    [provider]  Cyanocobalamin (VITAMIN B 12 PO) Take 50 mcg by mouth.    [provider]  cyclobenzaprine (FLEXERIL) 5 MG tablet Take 1 tablet (5 mg total) by mouth at bedtime. Patient not taking: No sig reported 08/26/18   Landis Martins, DPM  diltiazem (CARDIZEM CD) 120 MG 24 hr capsule Take 120 mg by mouth daily.    [provider]  doxycycline (VIBRA-TABS) 100 MG tablet Take 1 tablet (100 mg total) by mouth 2 (two) times daily. Patient not taking: No sig reported 05/02/19   Edrick Kins, DPM  DULoxetine (CYMBALTA) 60 MG capsule TAKE 2 CAPSULE BY MOUTH EVERY DAY Patient taking differently: Take 60 mg by mouth 2 (two) times daily. 02/01/19   [name], MD  ergocalciferol (VITAMIN D2) 50000 UNITS capsule Take 50,000 Units by mouth every Sunday.     [provider]  estrogens, conjugated, (PREMARIN) 0.3 MG tablet Take 0.3 mg by mouth daily. Take daily for 21 days then do not take for 7 days.    [provider]  ferrous sulfate 325 (65 FE) MG tablet Take 325 mg by mouth daily with breakfast.    [provider]  glucose blood (ACCU-CHEK SMARTVIEW) test strip Check blood sugar once daily. 250.00 10/26/11 10/25/12  [name], DO   hydroxypropyl methylcellulose / hypromellose (ISOPTO TEARS / GONIOVISC) 2.5 % ophthalmic solution Place 1 drop into both eyes 3 (three) times daily as needed for dry eyes.    [provider]  hydrOXYzine (ATARAX/VISTARIL) 25 MG tablet Take 25 mg by mouth 3 (three) times daily. 12/21/18   [provider]  ibuprofen (ADVIL,MOTRIN) 800 MG tablet TAKE 1 TABLET(800 MG) BY MOUTH EVERY 8 HOURS AS NEEDED FOR MILD PAIN OR MODERATE PAIN Patient not taking: No sig reported 02/09/19   Edrick Kins, DPM  lidocaine (LIDODERM) 5 % Place 1 patch onto the skin daily. Remove & Discard patch within 12 hours or as directed by MD Patient not taking: No sig reported 09/11/20   Chase Picket, MD  linagliptin (TRADJENTA) 5 MG TABS tablet Take 5 mg by mouth daily.    [provider]  losartan (COZAAR) 100 MG tablet Take 100 mg by mouth daily. 09/14/20   [provider]  lubiprostone (AMITIZA) 24 MCG capsule Take 24 mcg by mouth 2 (two) times daily.    [provider]  metFORMIN (GLUCOPHAGE) 500 MG tablet Take 500 mg by mouth daily with breakfast.    [provider]  metoprolol tartrate (LOPRESSOR) 25 MG tablet Take 25 mg by mouth 2 (two) times daily.    [provider]  mupirocin ointment (BACTROBAN) 2 % Apply topically 3 (three) times daily. Patient not taking: No sig reported 11/22/12   Janell Quiet, MD  naloxone [facility]) nasal spray 4 mg/0.1 mL Place 1 spray into the nose.    [provider]  omeprazole (PRILOSEC) 20 MG capsule Take 1 capsule (20 mg total) by mouth daily. Patient not taking: No sig reported 06/07/12   Larey Dresser  A, MD  ondansetron (ZOFRAN) 4 MG tablet Take 1 tablet (4 mg total) by mouth every 8 (eight) hours as needed for nausea or vomiting. Patient not taking: No sig reported 02/18/21   Evelina Bucy, DPM  oxyCODONE (ROXICODONE) 15 MG immediate release tablet Take 1 tablet (15 mg total) by mouth every 4 (four) hours as  needed for pain. Patient not taking: No sig reported 04/27/19   Tegeler, Gwenyth Allegra, MD  oxyCODONE-acetaminophen (PERCOCET) 10-325 MG tablet Take 1 tablet by mouth every 4 (four) hours as needed for pain. Patient not taking: No sig reported 02/18/21   Evelina Bucy, DPM  piroxicam (FELDENE) 10 MG capsule TAKE 1 CAPSULE BY MOUTH DAILY Patient not taking: No sig reported 06/06/12   Milta Deiters, MD  potassium chloride (MICRO-K) 10 MEQ CR capsule Take 20 mEq by mouth daily.    [provider]  Semaglutide,0.25 or 0.5MG /DOS, (OZEMPIC, 0.25 OR 0.5 MG/DOSE,) 2 MG/1.5ML SOPN Inject 0.5 mg into the skin once a week. 07/18/19   [provider]  silver sulfADIAZINE (SILVADENE) 1 % cream Apply 1 application topically daily. Patient not taking: No sig reported 07/17/19   Edrick Kins, DPM  traZODone (DESYREL) 100 MG tablet Take 2.5 tablets (250 mg total) by mouth at bedtime as needed for sleep. Patient not taking: No sig reported 11/23/18   [name], MD  rivaroxaban (XARELTO) 20 MG TABS tablet Take 20 mg by mouth daily with supper.  09/11/20  [provider]    Allergies    Hydrocodone-acetaminophen and Hydrocodone  Review of Systems   Review of Systems  Constitutional:  Negative for chills and fever.  HENT:  Negative for sore throat.   Eyes:  Negative for redness.  Respiratory:  Negative for cough.   Cardiovascular:  Positive for chest pain. Negative for palpitations and leg swelling.  Gastrointestinal:  Negative for abdominal pain, diarrhea and vomiting.  Genitourinary:  Negative for dysuria and flank pain.  Musculoskeletal:  Negative for neck pain and neck stiffness.       Bil trapezius area, as well as general body pain.   Skin:  Negative for rash.  Neurological:  Negative for weakness and numbness.  Hematological:  Does not bruise/bleed easily.  Psychiatric/Behavioral:  Negative for confusion.    Physical Exam Updated Vital Signs BP (!) 168/110 (BP  Location: Right Arm)    Pulse 69    Temp 97.8 F (36.6 C) (Oral)    Resp 18    LMP 12/06/1976    SpO2 99%   Physical Exam Vitals and nursing note reviewed.  Constitutional:      Appearance: Normal appearance. She is well-developed.  HENT:     Head: Atraumatic.     Comments: No sinus or temporal tenderness.     Nose: Nose normal.     Mouth/Throat:     Mouth: Mucous membranes are moist.  Eyes:     General: No scleral icterus.    Conjunctiva/sclera: Conjunctivae normal.     Pupils: Pupils are equal, round, and reactive to light.  Neck:     Trachea: No tracheal deviation.     Comments: No stiffness or rigidity Cardiovascular:     Rate and Rhythm: Normal rate and regular rhythm.     Pulses: Normal pulses.     Heart sounds: Normal heart sounds. No murmur heard.   No friction rub. No gallop.  Pulmonary:     Effort: Pulmonary effort is normal. No respiratory distress.  Breath sounds: Normal breath sounds.     Comments: Mild chest wall tenderness, reproducing symptoms. Normal chest movement, no sts or crepitus.  Abdominal:     General: Bowel sounds are normal. There is no distension.     Palpations: Abdomen is soft.     Tenderness: There is no abdominal tenderness. There is no guarding.  Genitourinary:    Comments: No cva tenderness.  Musculoskeletal:        General: No swelling or tenderness.     Cervical back: Normal range of motion and neck supple. No rigidity. No muscular tenderness.     Right lower leg: No edema.     Left lower leg: No edema.     Comments: C/T/L spine non tender, aligned, no step off. Trapezius/lateral neck muscular tenderness, no sts.   Skin:    General: Skin is warm and dry.     Findings: No rash.  Neurological:     Mental Status: She is alert.     Comments: Alert, speech normal. Motor/sens grossly intact bil. Steady gait.   Psychiatric:        Mood and Affect: Mood normal.    ED Results / Procedures / Treatments   Labs (all labs ordered are  listed, but only abnormal results are displayed) Results for orders placed or performed during the hospital encounter of 12/05/21  CBC  Result Value Ref Range   WBC 6.0 4.0 - 10.5 K/uL   RBC 3.19 (L) 3.87 - 5.11 MIL/uL   Hemoglobin 9.9 (L) 12.0 - 15.0 g/dL   HCT 29.8 (L) 36.0 - 46.0 %   MCV 93.4 80.0 - 100.0 fL   MCH 31.0 26.0 - 34.0 pg   MCHC 33.2 30.0 - 36.0 g/dL   RDW 13.1 11.5 - 15.5 %   Platelets 157 150 - 400 K/uL   nRBC 0.0 0.0 - 0.2 %  Comprehensive metabolic panel  Result Value Ref Range   Sodium 135 135 - 145 mmol/L   Potassium 3.7 3.5 - 5.1 mmol/L   Chloride 105 98 - 111 mmol/L   CO2 23 22 - 32 mmol/L   Glucose, Bld 153 (H) 70 - 99 mg/dL   BUN 10 8 - 23 mg/dL   Creatinine, Ser 0.98 0.44 - 1.00 mg/dL   Calcium 7.9 (L) 8.9 - 10.3 mg/dL   Total Protein 6.1 (L) 6.5 - 8.1 g/dL   Albumin 3.5 3.5 - 5.0 g/dL   AST 12 (L) 15 - 41 U/L   ALT 9 0 - 44 U/L   Alkaline Phosphatase 50 38 - 126 U/L   Total Bilirubin 0.4 0.3 - 1.2 mg/dL   GFR, Estimated >60 >60 mL/min   Anion gap 7 5 - 15  Lipase, blood  Result Value Ref Range   Lipase 53 (H) 11 - 51 U/L  Troponin I (High Sensitivity)  Result Value Ref Range   Troponin I (High Sensitivity) 7 <18 ng/L   DG Chest 1 View  Result Date: 12/05/2021 CLINICAL DATA:  Chest pain. EXAM: CHEST  1 VIEW COMPARISON:  Chest CT 12/18/2020 FINDINGS: There is mild-to-moderate cardiomegaly. The central vessels are normal caliber. There are mild chronic changes in the lung bases. No focal pneumonia is seen. No pleural effusion is evident. Mild aortic tortuosity. Thoracic spondylosis. IMPRESSION: Cardiomegaly with no evidence of acute chest disease or interval changes. Electronically Signed   By: Telford Nab M.D.   On: 12/05/2021 05:29     EKG EKG Interpretation  Date/Time:  Saturday December 05 2021 04:43:48 EST Ventricular Rate:  76 PR Interval:  160 QRS Duration: 88 QT Interval:  432 QTC Calculation: 486 R Axis:   31 Text  Interpretation: Normal sinus rhythm Nonspecific T wave abnormality No significant change since last tracing Confirmed by Lajean Saver [phone]) on 12/05/2021 10:15:13 AM  Radiology DG Chest 1 View  Result Date: 12/05/2021 CLINICAL DATA:  Chest pain. EXAM: CHEST  1 VIEW COMPARISON:  Chest CT 12/18/2020 FINDINGS: There is mild-to-moderate cardiomegaly. The central vessels are normal caliber. There are mild chronic changes in the lung bases. No focal pneumonia is seen. No pleural effusion is evident. Mild aortic tortuosity. Thoracic spondylosis. IMPRESSION: Cardiomegaly with no evidence of acute chest disease or interval changes. Electronically Signed   By: Telford Nab M.D.   On: 12/05/2021 05:29    Procedures Procedures   Medications Ordered in ED Medications  morphine 4 MG/ML injection 4 mg (has no administration in time range)  ondansetron (ZOFRAN) injection 4 mg (has no administration in time range)  ketorolac (TORADOL) 15 MG/ML injection 15 mg (has no administration in time range)    ED Course  I have reviewed the triage vital signs and the nursing notes.  Pertinent labs & imaging results that were available during my care of the patient were reviewed by me and considered in my medical decision making (see chart for details).    MDM Rules/Calculators/A&P                         Labs sent. Ecg. Cxr.   Reviewed nursing notes and prior charts for additional history.   Morphine iv, toradol iv.   Po fluids/food.   Labs reviewed/interpreted by me - trop normal. Wbc normal.   CXR reviewed/interpreted by me - no pna.   Pt appears comfortable, no distress, breathing comfortably. Currently hr is 66, rr 16, and pulse ox 99% room air.   Pt appears stable for d/c.   Rec f/u with her pcp/cardiologist, including  for recent symptoms, and as bp is high, hx same.   Return precautions provided.    Final Clinical Impression(s) / ED Diagnoses Final diagnoses:  Pain    Rx / DC  Orders ED Discharge Orders     None        Lajean Saver, MD 12/07/21 [phone]

## 2021-12-05 NOTE — Discharge Instructions (Addendum)
It was our pleasure to provide your ER care today - we hope that you feel better.  Your blood pressure is high today and your lab tests show anemia/low blood count - continue your blood pressure meds, limit salt intake, and follow up closely with your doctor/cardiologist in the next 1-2 weeks for these issues, as well as for your  recent chest discomfort.   Avoid smoking. If wheezing, use albuterol inhaler as need. If reflux symptoms, try pepcid and maalox for symptom relief.  Return to ER if worse, new symptoms, recurrent or persistent chest pain, increased trouble breathing, new/severe pain, fevers, or other concern.   You were given pain meds in the ER - no driving for the next 6 hours.

## 2021-12-05 NOTE — ED Triage Notes (Signed)
Pt BIB [facility] from home c/o chest pain for the past 5 hours. Home Oxycodone taken without relief. EMS gave ASA 324. No cardiac history. EMS VS BP 170/123, PR 84, 99% RA, 18-20 RR. 20G R forearm IV

## 2021-12-07 DIAGNOSIS — R011 Cardiac murmur, unspecified: Secondary | ICD-10-CM | POA: Diagnosis not present

## 2021-12-07 DIAGNOSIS — Z20822 Contact with and (suspected) exposure to covid-19: Secondary | ICD-10-CM | POA: Diagnosis not present

## 2021-12-07 DIAGNOSIS — I509 Heart failure, unspecified: Secondary | ICD-10-CM | POA: Diagnosis not present

## 2021-12-07 DIAGNOSIS — J90 Pleural effusion, not elsewhere classified: Secondary | ICD-10-CM | POA: Diagnosis not present

## 2021-12-07 DIAGNOSIS — Z7982 Long term (current) use of aspirin: Secondary | ICD-10-CM | POA: Insufficient documentation

## 2021-12-07 DIAGNOSIS — R0602 Shortness of breath: Secondary | ICD-10-CM | POA: Diagnosis present

## 2021-12-08 ENCOUNTER — Telehealth (HOSPITAL_COMMUNITY): Payer: Self-pay | Admitting: Emergency Medicine

## 2021-12-08 ENCOUNTER — Emergency Department (HOSPITAL_COMMUNITY): Payer: Medicare Other

## 2021-12-08 ENCOUNTER — Other Ambulatory Visit: Payer: Self-pay

## 2021-12-08 ENCOUNTER — Encounter (HOSPITAL_COMMUNITY): Payer: Self-pay | Admitting: Emergency Medicine

## 2021-12-08 ENCOUNTER — Telehealth: Payer: Self-pay | Admitting: Physician Assistant

## 2021-12-08 ENCOUNTER — Telehealth: Payer: Self-pay | Admitting: Surgery

## 2021-12-08 ENCOUNTER — Emergency Department (HOSPITAL_COMMUNITY)
Admission: EM | Admit: 2021-12-08 | Discharge: 2021-12-08 | Disposition: A | Discharge status: Home / Self Care | Payer: Medicare Other | Attending: Emergency Medicine | Admitting: Emergency Medicine

## 2021-12-08 DIAGNOSIS — R011 Cardiac murmur, unspecified: Secondary | ICD-10-CM

## 2021-12-08 DIAGNOSIS — I509 Heart failure, unspecified: Secondary | ICD-10-CM

## 2021-12-08 DIAGNOSIS — J90 Pleural effusion, not elsewhere classified: Secondary | ICD-10-CM

## 2021-12-08 LAB — COMPREHENSIVE METABOLIC PANEL
ALT: 9 U/L (ref 0–44)
AST: 13 U/L — ABNORMAL LOW (ref 15–41)
Albumin: 3.5 g/dL (ref 3.5–5.0)
Alkaline Phosphatase: 57 U/L (ref 38–126)
Anion gap: 9 (ref 5–15)
BUN: 15 mg/dL (ref 8–23)
CO2: 22 mmol/L (ref 22–32)
Calcium: 8.2 mg/dL — ABNORMAL LOW (ref 8.9–10.3)
Chloride: 107 mmol/L (ref 98–111)
Creatinine, Ser: 0.99 mg/dL (ref 0.44–1.00)
GFR, Estimated: >60 mL/min (ref >60)
Glucose, Bld: 82 mg/dL (ref 70–99)
Potassium: 4.1 mmol/L (ref 3.5–5.1)
Sodium: 138 mmol/L (ref 135–145)
Total Bilirubin: 0.9 mg/dL (ref 0.3–1.2)
Total Protein: 6.2 g/dL — ABNORMAL LOW (ref 6.5–8.1)

## 2021-12-08 LAB — CBC WITH DIFFERENTIAL/PLATELET
Abs Immature Granulocytes: 0.02 10*3/uL (ref 0.00–0.07)
Basophils Absolute: 0 10*3/uL (ref 0.0–0.1)
Basophils Relative: 1 %
Eosinophils Absolute: 0.1 10*3/uL (ref 0.0–0.5)
Eosinophils Relative: 2 %
HCT: 31.1 % — ABNORMAL LOW (ref 36.0–46.0)
Hemoglobin: 10.3 g/dL — ABNORMAL LOW (ref 12.0–15.0)
Immature Granulocytes: 0 %
Lymphocytes Relative: 20 %
Lymphs Abs: 1.4 10*3/uL (ref 0.7–4.0)
MCH: 30.7 pg (ref 26.0–34.0)
MCHC: 33.1 g/dL (ref 30.0–36.0)
MCV: 92.8 fL (ref 80.0–100.0)
Monocytes Absolute: 0.5 10*3/uL (ref 0.1–1.0)
Monocytes Relative: 7 %
Neutro Abs: 4.9 10*3/uL (ref 1.7–7.7)
Neutrophils Relative %: 70 %
Platelets: 157 10*3/uL (ref 150–400)
RBC: 3.35 MIL/uL — ABNORMAL LOW (ref 3.87–5.11)
RDW: 12.8 % (ref 11.5–15.5)
WBC: 7 10*3/uL (ref 4.0–10.5)
nRBC: 0 % (ref 0.0–0.2)

## 2021-12-08 LAB — BRAIN NATRIURETIC PEPTIDE: B Natriuretic Peptide: 705.8 pg/mL — ABNORMAL HIGH (ref 0.0–100.0)

## 2021-12-08 LAB — RESP PANEL BY RT-PCR (FLU A&B, COVID) ARPGX2
Influenza A by PCR: NEGATIVE
Influenza B by PCR: NEGATIVE
SARS Coronavirus 2 by RT PCR: NEGATIVE

## 2021-12-08 LAB — TROPONIN I (HIGH SENSITIVITY): Troponin I (High Sensitivity): 7 ng/L (ref <18)

## 2021-12-08 MED ORDER — FUROSEMIDE 40 MG PO TABS: 40.0000 mg | ORAL_TABLET | Freq: Every day | ORAL | 0 refills | Status: DC

## 2021-12-08 MED ORDER — FUROSEMIDE 10 MG/ML IJ SOLN
40.0000 mg | Freq: Once | INTRAMUSCULAR | Status: AC
  Administered 2021-12-08: 40 mg via INTRAVENOUS
  Filled 2021-12-08: qty 4

## 2021-12-08 MED ORDER — FUROSEMIDE 20 MG PO TABS: 40.0000 mg | ORAL_TABLET | Freq: Once | ORAL | Status: DC

## 2021-12-08 MED ORDER — OXYCODONE HCL 5 MG PO TABS
15.0000 mg | ORAL_TABLET | Freq: Once | ORAL | Status: AC
  Administered 2021-12-08: 15 mg via ORAL
  Filled 2021-12-08: qty 3

## 2021-12-08 NOTE — ED Triage Notes (Signed)
Patient reports SOB worse when lying on bed this evening with occasional dry cough , denies chest pain /no fever or chills.

## 2021-12-08 NOTE — ED Provider Notes (Signed)
Emergency Medicine Provider Triage Evaluation Note  Jenaya Saar , a 61 y.o. female  was evaluated in triage.  Pt complains of shortness of breath and pain in her back. She states that this started this afternoon.  She feels short of breath when she lays down or even tips her head forward.  She denies any fevers.  She states that she has a stabbing pain in her back.  She was seen for similar on 12/05/2021.  Review of Systems  Positive: Shortness of breath, back pain Negative: Fevers, syncope, leg swelling  Physical Exam  BP (!) 189/113    Pulse 75    Temp 98.4 F (36.9 C) (Oral)    Resp 16    Ht 5\' 11"  (1.803 m)    Wt 115 kg    LMP 12/06/1976    SpO2 96%    BMI 35.36 kg/m  Gen:   Awake, no distress   Resp:  Normal effort  MSK:   Moves extremities without difficulty  Other:  RRR, loud murmur auscultated.  She is very tender to palpation over her thoracic back diffusely which she states recreates and exacerbates her reported back pain.  Medical Decision Making  Medically screening exam initiated at 12:48 AM.  Appropriate orders placed.  Daurice Ovando was informed that the remainder of the evaluation will be completed by another provider, this initial triage assessment does not replace that evaluation, and the importance of remaining in the ED until their evaluation is complete.  Patient with back/chest pain, shortness of breath. Her back pain seems musculoskeletal, however given her risk factors and shortness of breath will obtain labs, chest x-ray, EKG.  Note: Portions of this report may have been transcribed using voice recognition software. Every effort was made to ensure accuracy; however, inadvertent computerized transcription errors may be present    Lorin Glass, PA-C 12/08/21 0050    Merryl Hacker, MD 12/08/21 2302

## 2021-12-08 NOTE — ED Provider Notes (Signed)
[facility] EMERGENCY DEPARTMENT Provider Note   CSN: [id number] Arrival date & time: 12/07/21  2356     History  Chief Complaint  Patient presents with   SOB / Cough    [name] is a 61 y.o. female.  Patient is a 61 yo female with pmh of CHF and heart murmur-unknown presenting for sob. Patients she developed sob while lying flat yesterday. Was seen on 12/31 in ED for similar symptoms. Denies chest pain. Admits to cough x 4 days. Denies fevers, chills, vomiting, or diarrhea.   The history is provided by the patient. No language interpreter was used.      Home Medications Prior to Admission medications   Medication Sig Start Date End Date Taking? Authorizing Provider  ACCU-CHEK FASTCLIX LANCETS MISC 1 each by Does not apply route daily. Check blood sugar once daily. 250.00 10/26/11   [name], DO  albuterol (PROVENTIL HFA;VENTOLIN HFA) 108 (90 Base) MCG/ACT inhaler Inhale 2 puffs into the lungs every 6 (six) hours as needed for wheezing or shortness of breath.     [provider]  aspirin 81 MG EC tablet Take by mouth.    [provider]  atorvastatin (LIPITOR) 20 MG tablet Take 20 mg by mouth daily.    [provider]  baclofen (LIORESAL) 10 MG tablet baclofen 10 mg tablet    [provider]  buPROPion (ZYBAN) 150 MG 12 hr tablet Take 150 mg by mouth 2 (two) times daily. 02/06/20   [provider]  Calcium Carbonate (CALCARB 600) 1500 MG TABS Take 1 tablet (1,500 mg total) by mouth 2 (two) times daily. Patient not taking: No sig reported 10/25/11   Burman Freestone, MD  calcium-vitamin D (OSCAL WITH D) 500-200 MG-UNIT TABS tablet Take by mouth. 12/19/20 03/19/21  [provider]  cephALEXin (KEFLEX) 500 MG capsule Take 1 capsule (500 mg total) by mouth 2 (two) times daily. Patient not taking: No sig reported 02/18/21   Evelina Bucy, DPM  Cholecalciferol 25 MCG (1000 UT) tablet Take by  mouth.    [provider]  Cyanocobalamin (VITAMIN B 12 PO) Take 50 mcg by mouth.    [provider]  cyclobenzaprine (FLEXERIL) 5 MG tablet Take 1 tablet (5 mg total) by mouth at bedtime. Patient not taking: No sig reported 08/26/18   Landis Martins, DPM  diltiazem (CARDIZEM CD) 120 MG 24 hr capsule Take 120 mg by mouth daily.    [provider]  doxycycline (VIBRA-TABS) 100 MG tablet Take 1 tablet (100 mg total) by mouth 2 (two) times daily. Patient not taking: No sig reported 05/02/19   Edrick Kins, DPM  DULoxetine (CYMBALTA) 60 MG capsule TAKE 2 CAPSULE BY MOUTH EVERY DAY Patient taking differently: Take 60 mg by mouth 2 (two) times daily. 02/01/19   [name], MD  ergocalciferol (VITAMIN D2) 50000 UNITS capsule Take 50,000 Units by mouth every Sunday.     [provider]  estrogens, conjugated, (PREMARIN) 0.3 MG tablet Take 0.3 mg by mouth daily. Take daily for 21 days then do not take for 7 days.    [provider]  ferrous sulfate 325 (65 FE) MG tablet Take 325 mg by mouth daily with breakfast.    [provider]  glucose blood (ACCU-CHEK SMARTVIEW) test strip Check blood sugar once daily. 250.00 10/26/11 10/25/12  [name], DO  hydroxypropyl methylcellulose / hypromellose (ISOPTO TEARS / GONIOVISC) 2.5 % ophthalmic solution Place  1 drop into both eyes 3 (three) times daily as needed for dry eyes.    [provider]  hydrOXYzine (ATARAX/VISTARIL) 25 MG tablet Take 25 mg by mouth 3 (three) times daily. 12/21/18   [provider]  ibuprofen (ADVIL,MOTRIN) 800 MG tablet TAKE 1 TABLET(800 MG) BY MOUTH EVERY 8 HOURS AS NEEDED FOR MILD PAIN OR MODERATE PAIN Patient not taking: No sig reported 02/09/19   Edrick Kins, DPM  lidocaine (LIDODERM) 5 % Place 1 patch onto the skin daily. Remove & Discard patch within 12 hours or as directed by MD Patient not taking: No sig reported 09/11/20   Chase Picket, MD   linagliptin (TRADJENTA) 5 MG TABS tablet Take 5 mg by mouth daily.    [provider]  losartan (COZAAR) 100 MG tablet Take 100 mg by mouth daily. 09/14/20   [provider]  lubiprostone (AMITIZA) 24 MCG capsule Take 24 mcg by mouth 2 (two) times daily.    [provider]  metFORMIN (GLUCOPHAGE) 500 MG tablet Take 500 mg by mouth daily with breakfast.    [provider]  metoprolol tartrate (LOPRESSOR) 25 MG tablet Take 25 mg by mouth 2 (two) times daily.    [provider]  mupirocin ointment (BACTROBAN) 2 % Apply topically 3 (three) times daily. Patient not taking: No sig reported 11/22/12   Janell Quiet, MD  naloxone [facility]) nasal spray 4 mg/0.1 mL Place 1 spray into the nose.    [provider]  omeprazole (PRILOSEC) 20 MG capsule Take 1 capsule (20 mg total) by mouth daily. Patient not taking: No sig reported 06/07/12   Bartholomew Crews, MD  ondansetron (ZOFRAN) 4 MG tablet Take 1 tablet (4 mg total) by mouth every 8 (eight) hours as needed for nausea or vomiting. Patient not taking: No sig reported 02/18/21   Evelina Bucy, DPM  oxyCODONE (ROXICODONE) 15 MG immediate release tablet Take 1 tablet (15 mg total) by mouth every 4 (four) hours as needed for pain. Patient not taking: No sig reported 04/27/19   Tegeler, Gwenyth Allegra, MD  oxyCODONE-acetaminophen (PERCOCET) 10-325 MG tablet Take 1 tablet by mouth every 4 (four) hours as needed for pain. Patient not taking: No sig reported 02/18/21   Evelina Bucy, DPM  piroxicam (FELDENE) 10 MG capsule TAKE 1 CAPSULE BY MOUTH DAILY Patient not taking: No sig reported 06/06/12   Milta Deiters, MD  potassium chloride (MICRO-K) 10 MEQ CR capsule Take 20 mEq by mouth daily.    [provider]  Semaglutide,0.25 or 0.5MG /DOS, (OZEMPIC, 0.25 OR 0.5 MG/DOSE,) 2 MG/1.5ML SOPN Inject 0.5 mg into the skin once a week. 07/18/19   [provider]  silver sulfADIAZINE (SILVADENE) 1 %  cream Apply 1 application topically daily. Patient not taking: No sig reported 07/17/19   Edrick Kins, DPM  traZODone (DESYREL) 100 MG tablet Take 2.5 tablets (250 mg total) by mouth at bedtime as needed for sleep. Patient not taking: No sig reported 11/23/18   [name], MD  rivaroxaban (XARELTO) 20 MG TABS tablet Take 20 mg by mouth daily with supper.  09/11/20  [provider]      Allergies    Hydrocodone-acetaminophen and Hydrocodone    Review of Systems   Review of Systems  Constitutional:  Negative for chills and fever.  HENT:  Negative for ear pain and sore throat.   Eyes:  Negative for pain and visual disturbance.  Respiratory:  Positive for cough  and shortness of breath.   Cardiovascular:  Negative for chest pain and palpitations.  Gastrointestinal:  Negative for abdominal pain and vomiting.  Genitourinary:  Negative for dysuria and hematuria.  Musculoskeletal:  Negative for arthralgias and back pain.  Skin:  Negative for color change and rash.  Neurological:  Negative for seizures and syncope.  All other systems reviewed and are negative.  Physical Exam Updated Vital Signs BP (!) 177/119 (BP Location: Right Arm)    Pulse 80    Temp 98.1 F (36.7 C) (Oral)    Resp 16    Ht 5\' 11"  (1.803 m)    Wt 115 kg    LMP 12/06/1976    SpO2 100%    BMI 35.36 kg/m  Physical Exam Vitals and nursing note reviewed.  Constitutional:      General: She is not in acute distress.    Appearance: She is well-developed.  HENT:     Head: Normocephalic and atraumatic.  Eyes:     Conjunctiva/sclera: Conjunctivae normal.  Cardiovascular:     Rate and Rhythm: Normal rate and regular rhythm.     Heart sounds: No murmur heard. Pulmonary:     Effort: Pulmonary effort is normal. No respiratory distress.     Breath sounds: Normal breath sounds.  Abdominal:     Palpations: Abdomen is soft.     Tenderness: There is no abdominal tenderness.  Musculoskeletal:        General: No  swelling.     Cervical back: Neck supple.  Skin:    General: Skin is warm and dry.     Capillary Refill: Capillary refill takes less than 2 seconds.  Neurological:     Mental Status: She is alert.  Psychiatric:        Mood and Affect: Mood normal.    ED Results / Procedures / Treatments   Labs (all labs ordered are listed, but only abnormal results are displayed) Labs Reviewed  COMPREHENSIVE METABOLIC PANEL - Abnormal; Notable for the following components:      Result Value   Calcium 8.2 (*)    Total Protein 6.2 (*)    AST 13 (*)    All other components within normal limits  BRAIN NATRIURETIC PEPTIDE - Abnormal; Notable for the following components:   B Natriuretic Peptide 705.8 (*)    All other components within normal limits  CBC WITH DIFFERENTIAL/PLATELET - Abnormal; Notable for the following components:   RBC 3.35 (*)    Hemoglobin 10.3 (*)    HCT 31.1 (*)    All other components within normal limits  RESP PANEL BY RT-PCR (FLU A&B, COVID) ARPGX2  TROPONIN I (HIGH SENSITIVITY)  TROPONIN I (HIGH SENSITIVITY)    EKG EKG Interpretation  Date/Time:  Tuesday December 08 2021 00:24:39 EST Ventricular Rate:  77 PR Interval:  148 QRS Duration: 88 QT Interval:  424 QTC Calculation: 479 R Axis:   117 Text Interpretation: Normal sinus rhythm Left posterior fascicular block Nonspecific ST and T wave abnormality PREVIOUS ECG IS PRESENT Confirmed by Campbell Stall ([id number]) on 0/08/7352 9:05:40 AM  Radiology DG Chest 2 View  Result Date: 12/08/2021 CLINICAL DATA:  Shortness of breath, difficulty breathing. EXAM: CHEST - 2 VIEW COMPARISON:  12/05/2021. FINDINGS: The heart is enlarged. Atherosclerotic calcification of the aorta is noted. Pulmonary vasculature is mildly distended. Interstitial prominence is noted bilaterally mild airspace disease is noted at the lung bases. There are small bilateral pleural effusions. No pneumothorax. No acute osseous abnormality. IMPRESSION: 1.  Cardiomegaly with mildly distended pulmonary vasculature. 2. Interstitial and airspace opacities predominantly at the lung bases, possible edema or infiltrate. 3. Small bilateral pleural effusions. Electronically Signed   By: Brett Fairy M.D.   On: 12/08/2021 01:29    Procedures Procedures    Medications Ordered in ED Medications - No data to display  ED Course/ Medical Decision Making/ A&P                           Medical Decision Making  9:18 AM  61 yo female with pmh of CHF and heart murmur-unknown presenting for sob. Patient is Aox3, no acute distress, no hypoxia, with stable vitals. Physical exam pertinent for systolic heart murmur and bilateral crackles in lower lung fields. No lower extremity swelling. Labs demonstrate elevated BNP. CXR demonstrates bilateral pleural effusions. Pt currently not on any diuretics. Lasix 40 mg iv given for suspected CHF exacerbation.   ECG stable with no ST segment elevation or depression. Troponin wnl.  Systolic heart murmur on exam-pt aware and states she has had for many years.   Patient in no distress and overall condition improved here in the ED. Detailed discussions were had with the patient regarding current findings, and need for close f/u with cardiologist for repeat echocardiogram for susptected worsening of CHF. The patient has been instructed to return immediately if the symptoms worsen in any way for re-evaluation. Patient verbalized understanding and is in agreement with current care plan. All questions answered prior to discharge.          Final Clinical Impression(s) / ED Diagnoses Final diagnoses:  Pleural effusion, bilateral  Acute on chronic congestive heart failure, unspecified heart failure type ([place])  Heart murmur    Rx / DC Orders ED Discharge Orders     None         Lianne Cure, DO 60/10/93 1009

## 2021-12-08 NOTE — Telephone Encounter (Signed)
Note created to change pharmacy for Lasix prescription

## 2021-12-08 NOTE — ED Notes (Signed)
Pt ambulatory to waiting room. Pt verbalized understanding of discharge instructions.   

## 2021-12-08 NOTE — Telephone Encounter (Signed)
ED RNCM attempted to call in prescription pharmacy not answering, message sent to EDP to follow up. No further EDCM needs. Will update patient.

## 2021-12-08 NOTE — ED Notes (Signed)
Pt called for vitals, no response. 

## 2021-12-08 NOTE — Telephone Encounter (Signed)
Patient called stating that she was not sure who her doctor was, and they sent the call through to me.  After reviewing the chart, was able to determine that she had been to the ER today with heart failure symptoms.  She was given Lasix in the ER and then discharged with a prescription.  There was some issue with her getting the prescription filled and she stated she had gone to several drug stores and was not able to get it.  Since she is not a cardiology patient and we had never seen her, I worked with the ER staff to resolve this.  The RN was able to get the ER PA to send the prescription to a different drugstore.  He then contacted the patient so she would know where to go pick it up.  Rosaria Ferries, PA-C 12/08/2021 7:25 PM

## 2021-12-08 NOTE — ED Notes (Signed)
Pt called for vitals x2 no response.

## 2021-12-09 NOTE — Care Management (Signed)
Patient has called several times and spoken with several different medical staff members concerning sending this prescription to different pharmacies.  ED RNCM spoke with [facility] and [facility] [address] which received the lasix 40mg  prescription and filled it. RNCM was contacted again by patient after she was not able to get prescription filled a [facility]. Spoke the pharmacist who explained that  prescription is held at [facility]. CM called [facility] to release prescription so [facility] can fill the script. Updated patient. No further ED RNCM needs identified.

## 2021-12-11 ENCOUNTER — Ambulatory Visit: Payer: Commercial Managed Care - HMO | Admitting: Internal Medicine

## 2021-12-21 NOTE — Progress Notes (Signed)
Cardiology Office Note:    Date:  12/30/2021   ID:  [name], DOB January 24, 1961, MRN [id number]  PCP:  [facility] [place] Providers Cardiologist:  None     Referring MD: [place]   CC: SOB Consulted for the evaluation of SOB at the [employer], [place]   History of Present Illness:    [name] is a 61 y.o. female with a hx of HTN with DM, Fibromyalgia, History of Hep C s/p therapy, Distant substance abuse, tobacco abuse, PAF (potentially in 2018- Xarelto was in per chart review) only who presents 12/30/21 for evaluation.  Patient notes that she is feeling fine.  Has had no chest pain, chest pressure, chest tightness, chest stinging .  Discomfort occurs as SOB, with minimal activity,  improves with laying down or sitting down.  Patient exertion notable for going grocery shopping and needs to ride a buggy because foot pain, fatigue, and DOE.  Notes shortness of breath and DOE.  Notes that when she is laying down she feels SOB. Needs 3-4 pillows.  No weight gain, leg swelling , or abdominal swelling.  No syncope or near syncope . Notes heart is pumping fast sometimes with activity.  No history of pre-eclampsia, gestation HTN or gestational DM.    Ambulatory BP not done.  Since lasix started on lasix 12/08/21 breathing has improved.  STOPBANG 3. Patient notes negative sleep test   Past Medical History:  Diagnosis Date   Arthritis    Cervicalgia    Chondromalacia of patella    Chronic neck pain    Chronic pain syndrome    Depression    secondary to loss of her son at age 1   Diabetes mellitus    Diabetic neuropathy ([place])    DMII (diabetes mellitus, type 2) ([place])    Enthesopathy of hip region    Fibromyalgia    Hep C w/o coma, chronic ([place])    Hepatitis C    diagnosed 2005   Hypertension    Insomnia    Insomnia    Low back pain    Lumbago    Obesity    Pain in joint, upper arm    Primary localized  osteoarthrosis, lower leg    Substance abuse ([place])    sober since 2001   Tobacco abuse    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 Check blood sugar once daily. 250.00   albuterol (PROVENTIL HFA;VENTOLIN HFA) 108 (90 Base) MCG/ACT inhaler Inhale 2 puffs into the lungs every 6 (six) hours as needed for wheezing or shortness of breath.    aspirin 81 MG EC tablet Take by mouth.   atorvastatin (LIPITOR) 20 MG tablet Take 20 mg by mouth daily.   baclofen (LIORESAL) 10 MG tablet baclofen 10 mg tablet   buprenorphine (BUTRANS) 10 MCG/HR PTWK 1 patch once a week.   buPROPion (WELLBUTRIN XL) 300 MG 24 hr tablet Take 300 mg by mouth daily.   buPROPion (ZYBAN) 150 MG 12 hr tablet Take 150 mg by mouth 2 (two) times daily.   Calcium Carbonate (CALCARB 600)  1500 MG TABS Take 1 tablet (1,500 mg total) by mouth 2 (two) times daily.   calcium-vitamin D (OSCAL WITH D) 500-200 MG-UNIT TABS tablet Take by mouth.   Cholecalciferol 25 MCG (1000 UT) tablet Take by mouth.   Cyanocobalamin (VITAMIN B 12 PO) Take 50 mcg by mouth.   dexlansoprazole (DEXILANT) 60 MG capsule Take 1 capsule by mouth daily.   diltiazem (CARDIZEM CD) 120 MG 24 hr capsule Take 120 mg by mouth daily.   Docusate Sodium (DSS) 100 MG CAPS Take by mouth as needed.   DULoxetine (CYMBALTA) 60 MG capsule TAKE 2 CAPSULE BY MOUTH EVERY DAY   ergocalciferol (VITAMIN D2) 50000 UNITS capsule Take 50,000 Units by mouth every Sunday.    estrogens, conjugated, (PREMARIN) 0.3 MG tablet Take 0.3 mg by mouth daily. Take daily for 21 days then do not take for 7 days.   ferrous sulfate 325 (65 FE) MG tablet Take 325 mg by mouth daily with  breakfast.   furosemide (LASIX) 40 MG tablet Take 1 tablet (40 mg total) by mouth daily.   gabapentin (NEURONTIN) 300 MG capsule Take 300 mg by mouth at bedtime.   glucose blood (ACCU-CHEK SMARTVIEW) test strip Check blood sugar once daily. 250.00   glycopyrrolate (ROBINUL) 1 MG tablet Take 1 mg by mouth daily.   hydroxypropyl methylcellulose / hypromellose (ISOPTO TEARS / GONIOVISC) 2.5 % ophthalmic solution Place 1 drop into both eyes 3 (three) times daily as needed for dry eyes.   hydrOXYzine (ATARAX/VISTARIL) 25 MG tablet Take 25 mg by mouth 3 (three) times daily.   ibuprofen (ADVIL,MOTRIN) 800 MG tablet TAKE 1 TABLET(800 MG) BY MOUTH EVERY 8 HOURS AS NEEDED FOR MILD PAIN OR MODERATE PAIN   linagliptin (TRADJENTA) 5 MG TABS tablet Take 5 mg by mouth daily.   losartan (COZAAR) 100 MG tablet Take 100 mg by mouth daily.   lubiprostone (AMITIZA) 24 MCG capsule Take 24 mcg by mouth 2 (two) times daily.   meloxicam (MOBIC) 15 MG tablet Take 15 mg by mouth daily.   metFORMIN (GLUCOPHAGE) 500 MG tablet Take 500 mg by mouth daily with breakfast.   metoprolol tartrate (LOPRESSOR) 25 MG tablet Take 25 mg by mouth 2 (two) times daily.   naloxone (NARCAN) nasal spray 4 mg/0.1 mL Place 1 spray into the nose.   omeprazole (PRILOSEC) 20 MG capsule Take 1 capsule (20 mg total) by mouth daily.   oxyCODONE (ROXICODONE) 15 MG immediate release tablet Take 1 tablet (15 mg total) by mouth every 4 (four) hours as needed for pain.   potassium chloride (MICRO-K) 10 MEQ CR capsule Take 20 mEq by mouth daily.   spironolactone (ALDACTONE) 25 MG tablet Take 0.5 tablets (12.5 mg total) by mouth daily.   traZODone (DESYREL) 100 MG tablet Take 2.5 tablets (250 mg total) by mouth at bedtime as needed for sleep.   [DISCONTINUED] lidocaine (LIDODERM) 5 % Place 1 patch onto the skin daily. Remove & Discard patch within 12 hours or as directed by MD   Current Facility-Administered Medications for the 12/30/21 encounter (Office  Visit) with Werner Lean, MD  Medication   ceFAZolin (ANCEF) IVPB 1 g/50 mL premix   lactated ringers infusion 1,000 mL   sodium chloride 0.9 % injection 20 mL   sodium chloride flush (NS) 0.9 % injection 20 mL   triamcinolone acetonide (KENALOG) 10 MG/ML injection 10 mg   triamcinolone acetonide (KENALOG-40) injection 40 mg     Allergies:   Hydrocodone-acetaminophen and Hydrocodone  Social History   Socioeconomic History   Marital status: Single    Spouse name: Not on file   Number of children: 2   Years of education: Not on file   Highest education level: Not on file  Occupational History   Occupation: CNA    Comment: worked for 15 years  Tobacco Use   Smoking status: Every Day    Packs/day: 0.50    Years: 35.00    Pack years: 17.50    Types: Cigarettes   Smokeless tobacco: Never  Vaping Use   Vaping Use: Never used  Substance and Sexual Activity   Alcohol use: No   Drug use: No    Types: "Crack" cocaine    Comment: last used Oct 2016   Sexual activity: Not Currently    Comment: Celebate since 2000  Other Topics Concern   Not on file  Social History Narrative   Born and raised by mom until mom died when she was 14yo. After that she stayed with her aunt. Dad was married to someone else and was never around.  Pt has 1 brother and 1 sister and pt is the middle child. Pt had a very rough childhood. Never married. 2 kids one son died several  yrs ago and other son has Schizophrenia. Pt has graduated HS and has some college. Pt is on disability and is unemployed. 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       Financial assistance approved for 100% discount at [facility] and has [facility] card   [employer]  September 29, 2010 2:27 PM   Social Determinants of Health   Financial Resource Strain: Not on file  Food Insecurity: Not on file  Transportation Needs: Not on file  Physical Activity: Not on file  Stress: Not on file  Social Connections: Not on file     Family  History: The patient's family history includes Alcohol abuse in her brother, father, and sister; Arthritis in an other family member; Diabetes in an other family member; Drug abuse in her brother and sister; Heart attack (age of onset: 35) in her son; Hypertension in an other family member; Schizophrenia in her son; Uterine cancer in her mother.  ROS:   Please see the history of present illness.     All other systems reviewed and are negative.  EKGs/Labs/Other Studies Reviewed:    The following studies were reviewed today:  EKG:   12/08/21: SR non specific TW flattening  Transthoracic Echocardiogram: Date: 06/12/2010 Results:  - Left ventricle: The cavity size was normal. Wall thickness was     normal. Systolic function was normal. The estimated ejection     fraction was in the range of 55% to 60%.   - Mitral valve: Mild regurgitation.   - Right ventricle: The cavity size was mildly dilated.   - Tricuspid valve: Mild-moderate regurgitation.   - Pulmonary arteries: PA peak pressure: 19mm Hg (S).   Transthoracic echocardiography. M-mode, complete 2D, spectral   Doppler, and color Doppler. Height: Height: 180cm. Height: 70.9in.   Weight: Weight: 146kg. Weight: 321.2lb. Body mass index: BMI:   45.1kg/m^2. Body surface area: BSA: 2.55m^2. Blood pressure: 127/88.   Patient status: Inpatient. Location: Bedside.    Recent Labs: 12/08/2021: ALT 9; B Natriuretic Peptide 705.8; BUN 15; Creatinine, Ser 0.99; Hemoglobin 10.3; Platelets 157; Potassium 4.1; Sodium 138  Recent Lipid Panel    Component Value Date/Time   CHOL 180 03/25/2016 0909   TRIG 122 03/25/2016 0909  HDL 39 (L) 03/25/2016 0909   CHOLHDL 4.6 03/25/2016 0909   VLDL 24 03/25/2016 0909   LDLCALC 117 03/25/2016 0909        Physical Exam:    VS:  BP 126/70    Pulse (!) 55    Ht 5\' 11"  (1.803 m)    Wt 91.6 kg    LMP 12/06/1976    SpO2 98%    BMI 28.17 kg/m     Wt Readings from Last 3 Encounters:  12/30/21 91.6 kg   12/08/21 115 kg  08/26/21 135 kg     Gen: No distress,    Neck: JVD with prominent V wave a nadir at the clavicle Cardiac: No Rubs or Gallops, no Murmur, regular bradycardia, +2 radial pulses Respiratory: Rhonic in bases bilaterally, norma effort, normal  respiratory rate GI: Soft, nontender, non-distended  MS: No  edema;  moves all extremities Integument: Skin feels warm Neuro:  At time of evaluation, alert and oriented to person/place/time/situation  Psych: Normal affect, patient feels OK   ASSESSMENT:    1. Hypertension, unspecified type   2. DOE (dyspnea on exertion)   3. History of atrial fibrillation   4. Hypertension associated with diabetes ([place])    PLAN:    DOE HTN with DM Fibromyalgia Distant substance abuse Tobacco abuse PAF NOS Moderate tricuspid regurgitation - will get echo - will get 14 day non -live ziopatch; if we find evidence of AF will switch ASA back to Xarelto 20 mg PO daily - adding aldactone 12.5 mg PO daily - continue atorvastatin 20 mg - continue lasix 40 mg PO daily - presently continue diltiazem 120 mg PO daily, if normal testing will stop metoprolol 25 mg PO BID - continue losartan 100 mg PO daily - may need to stop 20 meq potassium based pm tests - discussed vaping cessation - if she has no AF or vascular disease as we finish f/u will consider ASA stop after age 51  Three months me or APP         Medication Adjustments/Labs and Tests Ordered: Current medicines are reviewed at length with the patient today.  Concerns regarding medicines are outlined above.  Orders Placed This Encounter  Procedures   Basic metabolic panel   Pro b natriuretic peptide (BNP)   LONG TERM MONITOR (3-14 DAYS)   ECHOCARDIOGRAM COMPLETE   Meds ordered this encounter  Medications   spironolactone (ALDACTONE) 25 MG tablet    Sig: Take 0.5 tablets (12.5 mg total) by mouth daily.    Dispense:  45 tablet    Refill:  3    Patient Instructions   Medication Instructions:  Your physician has recommended you make the following change in your medication:  START: spironolactone (Aldactone) 12.5 mg by mouth daily  *If you need a refill on your cardiac medications before your next appointment, please call your pharmacy*   Lab Work: IN 7-10 DAYS: BNP, BMP If you have labs (blood work) drawn today and your tests are completely normal, you will receive your results only by: [place] (if you have MyChart) OR A paper copy in the mail If you have any lab test that is abnormal or we need to change your treatment, we will call you to review the results.   Testing/Procedures: Your physician has requested that you have an echocardiogram. Echocardiography is a painless test that uses sound waves to create images of your heart. It provides your doctor with information about the size and shape of  your heart and how well your hearts chambers and valves are working. This procedure takes approximately one hour. There are no restrictions for this procedure.  Your physician has requested that you wear a heart monitor.    Follow-Up: At [facility], you and your health needs are our priority.  As part of our continuing mission to provide you with exceptional heart care, we have created designated Provider Care Teams.  These Care Teams include your primary Cardiologist (physician) and Advanced Practice Providers (APPs -  Physician Assistants and Nurse Practitioners) who all work together to provide you with the care you need, when you need it.  We recommend signing up for the patient portal called "MyChart".  Sign up information is provided on this After Visit Summary.  MyChart is used to connect with patients for Virtual Visits (Telemedicine).  Patients are able to view lab/test results, encounter notes, upcoming appointments, etc.  Non-urgent messages can be sent to your provider as well.   To learn more about what you can do with MyChart, go to  [email or web address].    Your next appointment:   3 month(s)  The format for your next appointment:   In Person  Provider:   Rudean Haskell, MD or Melina Copa, PA-C or Ermalinda Barrios, PA-C         Other Instructions  Bryn Gulling- Long Term Monitor Instructions  Your physician has requested you wear a ZIO patch monitor for 14 days.  This is a single patch monitor. [employer] supplies one patch monitor per enrollment. Additional stickers are not available. Please do not apply patch if you will be having a Nuclear Stress Test,  Echocardiogram, Cardiac CT, MRI, or Chest Xray during the period you would be wearing the  monitor. The patch cannot be worn during these tests. You cannot remove and re-apply the  ZIO XT patch monitor.  Your ZIO patch monitor will be mailed 3 day USPS to your address on file. It may take 3-5 days  to receive your monitor after you have been enrolled.  Once you have received your monitor, please review the enclosed instructions. Your monitor  has already been registered assigning a specific monitor serial # to you.  Billing and Patient Assistance Program Information  We have supplied [employer] with any of your insurance information on file for billing purposes. [employer] offers a sliding scale Patient Assistance Program for patients that do not have  insurance, or whose insurance does not completely cover the cost of the ZIO monitor.  You must apply for the Patient Assistance Program to qualify for this discounted rate.  To apply, please call [employer] at [phone], select option 4, select option 2, ask to apply for  Patient Assistance Program. Theodore Demark will ask your household income, and how many people  are in your household. They will quote your out-of-pocket cost based on that information.  [employer] will also be able to set up a 4-month, interest-free payment plan if needed.  Applying the monitor   Shave hair from upper left chest.  Hold abrader disc by  orange tab. Rub abrader in 40 strokes over the upper left chest as  indicated in your monitor instructions.  Clean area with 4 enclosed alcohol pads. Let dry.  Apply patch as indicated in monitor instructions. Patch will be placed under collarbone on left  side of chest with arrow pointing upward.  Rub patch adhesive wings for 2 minutes. Remove white label marked "1". Remove the white  label marked "2". Rub  patch adhesive wings for 2 additional minutes.  While looking in a mirror, press and release button in center of patch. A small green light will  flash 3-4 times. This will be your only indicator that the monitor has been turned on.  Do not shower for the first 24 hours. You may shower after the first 24 hours.  Press the button if you feel a symptom. You will hear a small click. Record Date, Time and  Symptom in the Patient Logbook.  When you are ready to remove the patch, follow instructions on the last 2 pages of Patient  Logbook. Stick patch monitor onto the last page of Patient Logbook.  Place Patient Logbook in the blue and white box. Use locking tab on box and tape box closed  securely. The blue and white box has prepaid postage on it. Please place it in the mailbox as  soon as possible. Your physician should have your test results approximately 7 days after the  monitor has been mailed back to [facility].  Call [place] at [phone] if you have questions regarding  your ZIO XT patch monitor. Call them immediately if you see an orange light blinking on your  monitor.  If your monitor falls off in less than 4 days, contact our Monitor department at [phone].  If your monitor becomes loose or falls off after 4 days call [employer] at [phone] for  suggestions on securing your monitor     Signed, Werner Lean, MD  12/30/2021 9:43 AM    [place]

## 2021-12-30 ENCOUNTER — Other Ambulatory Visit: Payer: Self-pay

## 2021-12-30 ENCOUNTER — Other Ambulatory Visit: Payer: Self-pay | Admitting: Internal Medicine

## 2021-12-30 ENCOUNTER — Ambulatory Visit (INDEPENDENT_AMBULATORY_CARE_PROVIDER_SITE_OTHER): Payer: Medicare Other | Admitting: Internal Medicine

## 2021-12-30 ENCOUNTER — Ambulatory Visit (INDEPENDENT_AMBULATORY_CARE_PROVIDER_SITE_OTHER): Payer: Medicare Other

## 2021-12-30 ENCOUNTER — Encounter: Payer: Self-pay | Admitting: Internal Medicine

## 2021-12-30 VITALS — BP 126/70 | HR 55 | Ht 71.0 in | Wt 202.0 lb

## 2021-12-30 DIAGNOSIS — R0602 Shortness of breath: Secondary | ICD-10-CM

## 2021-12-30 DIAGNOSIS — R0609 Other forms of dyspnea: Secondary | ICD-10-CM

## 2021-12-30 DIAGNOSIS — Z8679 Personal history of other diseases of the circulatory system: Secondary | ICD-10-CM | POA: Diagnosis not present

## 2021-12-30 DIAGNOSIS — I340 Nonrheumatic mitral (valve) insufficiency: Secondary | ICD-10-CM

## 2021-12-30 DIAGNOSIS — E1159 Type 2 diabetes mellitus with other circulatory complications: Secondary | ICD-10-CM

## 2021-12-30 DIAGNOSIS — I10 Essential (primary) hypertension: Secondary | ICD-10-CM

## 2021-12-30 DIAGNOSIS — R001 Bradycardia, unspecified: Secondary | ICD-10-CM

## 2021-12-30 DIAGNOSIS — R9431 Abnormal electrocardiogram [ECG] [EKG]: Secondary | ICD-10-CM

## 2021-12-30 DIAGNOSIS — I152 Hypertension secondary to endocrine disorders: Secondary | ICD-10-CM

## 2021-12-30 MED ORDER — SPIRONOLACTONE 25 MG PO TABS: 12.5000 mg | ORAL_TABLET | Freq: Every day | ORAL | 3 refills | Status: DC

## 2021-12-30 NOTE — Progress Notes (Unsigned)
Enrolled for [employer] to mail a ZIO XT long term holter monitor to the patients address on file.  

## 2021-12-30 NOTE — Patient Instructions (Signed)
Medication Instructions:  Your physician has recommended you make the following change in your medication:  START: spironolactone (Aldactone) 12.5 mg by mouth daily  *If you need a refill on your cardiac medications before your next appointment, please call your pharmacy*   Lab Work: IN 7-10 DAYS: BNP, BMP If you have labs (blood work) drawn today and your tests are completely normal, you will receive your results only by: [place] (if you have MyChart) OR A paper copy in the mail If you have any lab test that is abnormal or we need to change your treatment, we will call you to review the results.   Testing/Procedures: Your physician has requested that you have an echocardiogram. Echocardiography is a painless test that uses sound waves to create images of your heart. It provides your doctor with information about the size and shape of your heart and how well your hearts chambers and valves are working. This procedure takes approximately one hour. There are no restrictions for this procedure.  Your physician has requested that you wear a heart monitor.    Follow-Up: At [facility], you and your health needs are our priority.  As part of our continuing mission to provide you with exceptional heart care, we have created designated Provider Care Teams.  These Care Teams include your primary Cardiologist (physician) and Advanced Practice Providers (APPs -  Physician Assistants and Nurse Practitioners) who all work together to provide you with the care you need, when you need it.  We recommend signing up for the patient portal called "MyChart".  Sign up information is provided on this After Visit Summary.  MyChart is used to connect with patients for Virtual Visits (Telemedicine).  Patients are able to view lab/test results, encounter notes, upcoming appointments, etc.  Non-urgent messages can be sent to your provider as well.   To learn more about what you can do with MyChart, go to  [email or web address].    Your next appointment:   3 month(s)  The format for your next appointment:   In Person  Provider:   Rudean Haskell, MD or Melina Copa, PA-C or Ermalinda Barrios, PA-C         Other Instructions  Bryn Gulling- Long Term Monitor Instructions  Your physician has requested you wear a ZIO patch monitor for 14 days.  This is a single patch monitor. [employer] supplies one patch monitor per enrollment. Additional stickers are not available. Please do not apply patch if you will be having a Nuclear Stress Test,  Echocardiogram, Cardiac CT, MRI, or Chest Xray during the period you would be wearing the  monitor. The patch cannot be worn during these tests. You cannot remove and re-apply the  ZIO XT patch monitor.  Your ZIO patch monitor will be mailed 3 day USPS to your address on file. It may take 3-5 days  to receive your monitor after you have been enrolled.  Once you have received your monitor, please review the enclosed instructions. Your monitor  has already been registered assigning a specific monitor serial # to you.  Billing and Patient Assistance Program Information  We have supplied [employer] with any of your insurance information on file for billing purposes. [employer] offers a sliding scale Patient Assistance Program for patients that do not have  insurance, or whose insurance does not completely cover the cost of the ZIO monitor.  You must apply for the Patient Assistance Program to qualify for this discounted rate.  To apply, please call [employer] at  [phone], select option 4, select option 2, ask to apply for  Patient Assistance Program. Theodore Demark will ask your household income, and how many people  are in your household. They will quote your out-of-pocket cost based on that information.  [employer] will also be able to set up a 42-month, interest-free payment plan if needed.  Applying the monitor   Shave hair from upper left chest.  Hold abrader disc by  orange tab. Rub abrader in 40 strokes over the upper left chest as  indicated in your monitor instructions.  Clean area with 4 enclosed alcohol pads. Let dry.  Apply patch as indicated in monitor instructions. Patch will be placed under collarbone on left  side of chest with arrow pointing upward.  Rub patch adhesive wings for 2 minutes. Remove white label marked "1". Remove the white  label marked "2". Rub patch adhesive wings for 2 additional minutes.  While looking in a mirror, press and release button in center of patch. A small green light will  flash 3-4 times. This will be your only indicator that the monitor has been turned on.  Do not shower for the first 24 hours. You may shower after the first 24 hours.  Press the button if you feel a symptom. You will hear a small click. Record Date, Time and  Symptom in the Patient Logbook.  When you are ready to remove the patch, follow instructions on the last 2 pages of Patient  Logbook. Stick patch monitor onto the last page of Patient Logbook.  Place Patient Logbook in the blue and white box. Use locking tab on box and tape box closed  securely. The blue and white box has prepaid postage on it. Please place it in the mailbox as  soon as possible. Your physician should have your test results approximately 7 days after the  monitor has been mailed back to [facility].  Call [place] at [phone] if you have questions regarding  your ZIO XT patch monitor. Call them immediately if you see an orange light blinking on your  monitor.  If your monitor falls off in less than 4 days, contact our Monitor department at [phone].  If your monitor becomes loose or falls off after 4 days call [employer] at [phone] for  suggestions on securing your monitor

## 2022-01-01 DIAGNOSIS — R0602 Shortness of breath: Secondary | ICD-10-CM | POA: Diagnosis not present

## 2022-01-01 DIAGNOSIS — R0609 Other forms of dyspnea: Secondary | ICD-10-CM

## 2022-01-01 DIAGNOSIS — R001 Bradycardia, unspecified: Secondary | ICD-10-CM | POA: Diagnosis not present

## 2022-01-06 ENCOUNTER — Other Ambulatory Visit: Payer: Commercial Managed Care - HMO

## 2022-01-06 ENCOUNTER — Other Ambulatory Visit (HOSPITAL_COMMUNITY): Payer: Commercial Managed Care - HMO

## 2022-01-06 ENCOUNTER — Encounter (HOSPITAL_COMMUNITY): Payer: Self-pay | Admitting: Internal Medicine

## 2022-01-06 ENCOUNTER — Encounter: Payer: Self-pay | Admitting: Cardiology

## 2022-01-06 NOTE — Progress Notes (Unsigned)
Patient ID: [name], female   DOB: [birth year], 61 y.o.   MRN: [id number]  Verified appointment "no show" status with Pearline Cables at 7:45am.

## 2022-01-11 ENCOUNTER — Ambulatory Visit (HOSPITAL_COMMUNITY): Payer: Medicare Other | Attending: Cardiology

## 2022-01-11 ENCOUNTER — Other Ambulatory Visit: Payer: Self-pay

## 2022-01-11 ENCOUNTER — Other Ambulatory Visit (HOSPITAL_COMMUNITY): Payer: Medicare Other

## 2022-01-11 DIAGNOSIS — R0609 Other forms of dyspnea: Secondary | ICD-10-CM | POA: Insufficient documentation

## 2022-01-11 DIAGNOSIS — Z8679 Personal history of other diseases of the circulatory system: Secondary | ICD-10-CM | POA: Diagnosis not present

## 2022-01-11 DIAGNOSIS — I10 Essential (primary) hypertension: Secondary | ICD-10-CM | POA: Insufficient documentation

## 2022-01-11 LAB — ECHOCARDIOGRAM COMPLETE
Area-P 1/2: 3.58 cm2
MV M vel: 6.57 m/s
MV Peak grad: 172.7 mmHg
P 1/2 time: 914 msec
Radius: 0.9 cm
S' Lateral: 4.1 cm

## 2022-01-12 ENCOUNTER — Telehealth: Payer: Self-pay | Admitting: Internal Medicine

## 2022-01-12 NOTE — Telephone Encounter (Signed)
Called Patient in regard to echo results (has severe mitral regurgitation) Left voicemail- will try to reach patient again.  If we miss patient we will send a message through [place].  A/P Severe MR Will schedule TEE  Rudean Haskell, MD [facility]  [address] [place], [place] [address] [phone]  4:10 PM

## 2022-01-14 ENCOUNTER — Telehealth: Payer: Self-pay

## 2022-01-14 ENCOUNTER — Telehealth: Payer: Self-pay | Admitting: Internal Medicine

## 2022-01-14 NOTE — Telephone Encounter (Signed)
Left a message for pt to call back.  Will notify pt of 02/03/22 scheduled TEE.  Letter pending to be released to my chart upon pt return call.

## 2022-01-14 NOTE — Telephone Encounter (Signed)
Called Patient in regard to Severe MR on Echo and her persistent SOB Discussed TEE risks and benefits Answered questions in regard to natural progression of MR  Patient doesn't want to do 02/10/22 TEE because its her sisters birthday. Will get TEE with structural team  Assessment Severe MR   Plan TEE  Patient had no further questions.  Rudean Haskell, MD [address] [place], [place] [address] [phone]  1:29 PM

## 2022-01-15 NOTE — Telephone Encounter (Signed)
Pt called in RN reviewed TEE instructions with pt and releases letter to my chart for pt to review.  Pt had no questions or concerns.

## 2022-02-02 ENCOUNTER — Telehealth (INDEPENDENT_AMBULATORY_CARE_PROVIDER_SITE_OTHER): Payer: Medicare Other | Admitting: Internal Medicine

## 2022-02-02 DIAGNOSIS — I340 Nonrheumatic mitral (valve) insufficiency: Secondary | ICD-10-CM

## 2022-02-02 NOTE — Addendum Note (Signed)
Addended by: Rudean Haskell A on: 02/02/2022 04:07 PM   Modules accepted: Orders

## 2022-02-02 NOTE — Telephone Encounter (Signed)
Cardiology Tele-Visit Office Note:    Date:  02/02/2022   ID:  [name], DOB 17-Jun-1961, MRN [id number]  PCP:  [facility] [place] [facility] Providers Cardiologist:  None     C: Severe MR  History of Present Illness:    [name] is a 61 y.o. female with a hx of HTN with DM, Fibromyalgia, History of Hep C s/p therapy, Distant substance abuse, tobacco abuse, PAF (potentially in 2018- Xarelto was in per chart review) only who presents 12/30/21 for evaluation.  Found to have severe MR 01/11/22.    She feels ok.  Still have some SOB, she had forgotten that tomorrow was   Past Medical History:  Diagnosis Date   Arthritis    Cervicalgia    Chondromalacia of patella    Chronic neck pain    Chronic pain syndrome    Depression    secondary to loss of her son at age 62   Diabetes mellitus    Diabetic neuropathy ([place])    DMII (diabetes mellitus, type 2) ([place])    Enthesopathy of hip region    Fibromyalgia    Hep C w/o coma, chronic ([place])    Hepatitis C    diagnosed 2005   Hypertension    Insomnia    Insomnia    Low back pain    Lumbago    Obesity    Pain in joint, upper arm    Primary localized osteoarthrosis, lower leg    Substance abuse ([place])    sober since 2001   Tobacco abuse    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   Current Facility-Administered Medications for the 02/02/22 encounter (Telephone) with Werner Lean, MD  Medication   ceFAZolin (ANCEF) IVPB 1 g/50 mL premix   lactated ringers infusion 1,000 mL   sodium chloride 0.9 % injection 20 mL   sodium chloride flush (NS) 0.9 % injection  20 mL   triamcinolone acetonide (KENALOG) 10 MG/ML injection 10 mg   triamcinolone acetonide (KENALOG-40) injection 40 mg     Allergies:   Hydrocodone-acetaminophen and Hydrocodone   Social History   Socioeconomic History   Marital status: Single    Spouse name: Not on file   Number of children: 2   Years of education: Not on file   Highest education level: Not on file  Occupational History   Occupation: CNA    Comment: worked for 15 years  Tobacco Use   Smoking status: Every Day    Packs/day: 0.50    Years: 35.00    Pack years: 17.50    Types: Cigarettes   Smokeless tobacco: Never  Vaping Use   Vaping Use: Never used  Substance and Sexual Activity   Alcohol use: No   Drug use: No    Types: "Crack" cocaine    Comment: last used Oct 2016   Sexual activity: Not Currently    Comment: Celebate since 2000  Other Topics Concern   Not on file  Social History Narrative   Born and raised by mom until mom died when she was 14yo. After that she stayed  with her aunt. Dad was married to someone else and was never around.  Pt has 1 brother and 1 sister and pt is the middle child. Pt had a very rough childhood. Never married. 2 kids one son died several  yrs ago and other son has Schizophrenia. Pt has graduated HS and has some college. Pt is on disability and is unemployed. 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       Financial assistance approved for 100% discount at [facility] and has [facility] card   [employer]  September 29, 2010 2:27 PM   Social Determinants of Health   Financial Resource Strain: Not on file  Food Insecurity: Not on file  Transportation Needs: Not on file  Physical Activity: Not on file  Stress: Not on file  Social Connections: Not on file     Family History: The patient's family history includes Alcohol abuse in her brother, father, and sister; Arthritis in an other family member; Diabetes in an other family member; Drug abuse in her brother and sister; Heart attack  (age of onset: 3) in her son; Hypertension in an other family member; Schizophrenia in her son; Uterine cancer in her mother.  ROS:   Please see the history of present illness.     All other systems reviewed and are negative.  EKGs/Labs/Other Studies Reviewed:    The following studies were reviewed today:  EKG:   12/08/21: SR non specific TW flattening  Transthoracic Echocardiogram: Date: 06/12/2010 Results:  - Left ventricle: The cavity size was normal. Wall thickness was     normal. Systolic function was normal. The estimated ejection     fraction was in the range of 55% to 60%.   - Mitral valve: Mild regurgitation.   - Right ventricle: The cavity size was mildly dilated.   - Tricuspid valve: Mild-moderate regurgitation.   - Pulmonary arteries: PA peak pressure: 38mm Hg (S).   Transthoracic echocardiography. M-mode, complete 2D, spectral   Doppler, and color Doppler. Height: Height: 180cm. Height: 70.9in.   Weight: Weight: 146kg. Weight: 321.2lb. Body mass index: BMI:   45.1kg/m^2. Body surface area: BSA: 2.49m^2. Blood pressure: 127/88.   Patient status: Inpatient. Location: Bedside.    Recent Labs: 12/08/2021: ALT 9; B Natriuretic Peptide 705.8; BUN 15; Creatinine, Ser 0.99; Hemoglobin 10.3; Platelets 157; Potassium 4.1; Sodium 138  Recent Lipid Panel    Component Value Date/Time   CHOL 180 03/25/2016 0909   TRIG 122 03/25/2016 0909   HDL 39 (L) 03/25/2016 0909   CHOLHDL 4.6 03/25/2016 0909   VLDL 24 03/25/2016 0909   LDLCALC 117 03/25/2016 0909        Physical Exam:    Virtual visit only  ASSESSMENT:    No diagnosis found.  PLAN:     Severe MR - After careful review of history and examination, the risks and benefits of transesophageal echocardiogram have been explained including risks of esophageal damage, perforation (1:10,000 risk), bleeding, pharyngeal hematoma as well as other potential complications associated with conscious sedation including aspiration,  arrhythmia, respiratory failure and death. Alternatives to treatment were discussed, questions were answered. Patient is willing to proceed.   Werner Lean, MD  02/02/2022 3:32 PM           Medication Adjustments/Labs and Tests Ordered: Current medicines are reviewed at length with the patient today.  Concerns regarding medicines are outlined above.  No orders of the defined types were placed in this encounter.  No orders of  the defined types were placed in this encounter.   There are no Patient Instructions on file for this visit.   Signed, Werner Lean, MD  02/02/2022 3:19 PM    [place]

## 2022-02-02 NOTE — Addendum Note (Signed)
Addended by: Rudean Haskell A on: 02/02/2022 04:05 PM   Modules accepted: Orders

## 2022-02-03 ENCOUNTER — Ambulatory Visit (HOSPITAL_COMMUNITY)
Admission: RE | Admit: 2022-02-03 | Discharge: 2022-02-03 | Disposition: A | Discharge status: Home / Self Care | Payer: Medicare Other | Source: Ambulatory Visit | Attending: Cardiovascular Disease | Admitting: Cardiovascular Disease

## 2022-02-03 ENCOUNTER — Ambulatory Visit (HOSPITAL_BASED_OUTPATIENT_CLINIC_OR_DEPARTMENT_OTHER): Payer: Medicare Other | Admitting: Anesthesiology

## 2022-02-03 ENCOUNTER — Ambulatory Visit (HOSPITAL_BASED_OUTPATIENT_CLINIC_OR_DEPARTMENT_OTHER)
Admission: RE | Admit: 2022-02-03 | Discharge: 2022-02-03 | Disposition: A | Discharge status: Home / Self Care | Payer: Medicare Other | Source: Ambulatory Visit | Attending: Internal Medicine | Admitting: Internal Medicine

## 2022-02-03 ENCOUNTER — Ambulatory Visit (HOSPITAL_COMMUNITY): Payer: Medicare Other | Admitting: Anesthesiology

## 2022-02-03 ENCOUNTER — Encounter (HOSPITAL_COMMUNITY)
Admission: RE | Disposition: A | Discharge status: Home / Self Care | Payer: Self-pay | Source: Ambulatory Visit | Attending: Cardiovascular Disease

## 2022-02-03 ENCOUNTER — Other Ambulatory Visit: Payer: Self-pay

## 2022-02-03 ENCOUNTER — Encounter (HOSPITAL_COMMUNITY): Payer: Self-pay | Admitting: Cardiovascular Disease

## 2022-02-03 DIAGNOSIS — E1140 Type 2 diabetes mellitus with diabetic neuropathy, unspecified: Secondary | ICD-10-CM | POA: Diagnosis not present

## 2022-02-03 DIAGNOSIS — E119 Type 2 diabetes mellitus without complications: Secondary | ICD-10-CM

## 2022-02-03 DIAGNOSIS — M797 Fibromyalgia: Secondary | ICD-10-CM | POA: Insufficient documentation

## 2022-02-03 DIAGNOSIS — I10 Essential (primary) hypertension: Secondary | ICD-10-CM | POA: Insufficient documentation

## 2022-02-03 DIAGNOSIS — F418 Other specified anxiety disorders: Secondary | ICD-10-CM | POA: Diagnosis not present

## 2022-02-03 DIAGNOSIS — I081 Rheumatic disorders of both mitral and tricuspid valves: Secondary | ICD-10-CM

## 2022-02-03 DIAGNOSIS — F1911 Other psychoactive substance abuse, in remission: Secondary | ICD-10-CM | POA: Diagnosis not present

## 2022-02-03 DIAGNOSIS — I340 Nonrheumatic mitral (valve) insufficiency: Secondary | ICD-10-CM | POA: Diagnosis not present

## 2022-02-03 DIAGNOSIS — F17210 Nicotine dependence, cigarettes, uncomplicated: Secondary | ICD-10-CM | POA: Insufficient documentation

## 2022-02-03 HISTORY — PX: TEE WITHOUT CARDIOVERSION: SHX5443

## 2022-02-03 LAB — ECHO TEE: Radius: 0.9 cm

## 2022-02-03 LAB — GLUCOSE, CAPILLARY
Glucose-Capillary: 87 mg/dL (ref 70–99)
Glucose-Capillary: 81 mg/dL (ref 70–99)

## 2022-02-03 SURGERY — ECHOCARDIOGRAM, TRANSESOPHAGEAL
Anesthesia: Monitor Anesthesia Care

## 2022-02-03 MED ORDER — SODIUM CHLORIDE 0.9 % IV SOLN: INTRAVENOUS | Status: DC

## 2022-02-03 MED ORDER — PROPOFOL 10 MG/ML IV BOLUS
INTRAVENOUS | Status: DC | PRN
Start: 2022-02-03 — End: 2022-02-03
  Administered 2022-02-03: 50 mg via INTRAVENOUS

## 2022-02-03 MED ORDER — LIDOCAINE 2% (20 MG/ML) 5 ML SYRINGE
INTRAMUSCULAR | Status: DC | PRN
  Administered 2022-02-03: 100 mg via INTRAVENOUS

## 2022-02-03 MED ORDER — PROPOFOL 500 MG/50ML IV EMUL
INTRAVENOUS | Status: DC | PRN
Start: 2022-02-03 — End: 2022-02-03
  Administered 2022-02-03: 150 ug/kg/min via INTRAVENOUS

## 2022-02-03 NOTE — CV Procedure (Signed)
TEE: ?Anesthesia: Propofol ? ?Normal EF 55% ?Severe MR rheumatic appearing valve with thickened leaflets and ERO 11-91% LICA stenosis.  F/U duplex in one year cm2 near P1/A1 and P2/A2 ?Severe TR with elevated PA pressures ?Moderate bi atrial enlargement  ?No LAA thrombus ?No ASD/PFO ?Normal RV ?Normal AV ?No effusion ? ?Jenkins Rouge MD [facility] ?

## 2022-02-03 NOTE — H&P (Signed)
Expand All Collapse All ?   ?   ?   ?   ?   ?   ?   ?   ?   ?   ?   ?   ?   ?   ?   ?   ?   ?   ?   ?   ?   ?   ?   ?   ?   ?   ?   ?   ?   ?   ?   ?   ?   ?   ?   ?   ?   ?   ?   ?   ?   ?   ?   ?   ?   ?   ?   ?   ?   ?   ?   ?   ?   ?   ?   ?   ?   ?   ?   ?   ?   ?   ?   ?   ?   ?   ?   ?   ?   ?   ?   ?   ?   ?   ?   ?   ?   ?   ?   ?   ?   ?   ?   ?   ?   ?   ?   ?   ?   ?   ?   ?   ? ?Cardiology Tele-Visit Office Note:   ?  ?Date:  02/02/2022  ?  ?ID:  [name], DOB May 01, 1961, MRN [id number] ?  ?PCP:  [facility] [place] ?             ?[facility] Providers ?Cardiologist:  None    ?  ?C: Severe MR ?  ?History of Present Illness:   ?  ?[name] is a 61 y.o. female with a hx of HTN with DM, Fibromyalgia, History of Hep C s/p therapy, Distant substance abuse, tobacco abuse, PAF (potentially in 2018- Xarelto was in per chart review) only who presents 12/30/21 for evaluation. ?  ?Found to have severe MR 01/11/22.   ?  ?Discussed TEE and risks including esophageal injury and intubation willing to proceed   ?  ?    ?Past Medical History:  ?Diagnosis Date  ? Arthritis    ? Cervicalgia    ? Chondromalacia of patella    ? Chronic neck pain    ? Chronic pain syndrome    ? Depression    ?  secondary to loss of her son at age 60  ? Diabetes mellitus    ? Diabetic neuropathy ([place])    ? DMII (diabetes mellitus, type 2) ([place])    ? Enthesopathy of hip region    ? Fibromyalgia    ? Hep C w/o coma, chronic ([facility])    ? Hepatitis C    ?  diagnosed 2005  ? Hypertension    ? Insomnia    ? Insomnia    ? Low back pain    ? Lumbago    ? Obesity    ? Pain in joint, upper arm    ? Primary localized osteoarthrosis, lower leg    ? Substance abuse ([place])    ?  sober since 2001  ? Tobacco abuse    ?  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  ?  ?     ?Past Surgical History:  ?Procedure Laterality Date  ? ABDOMINAL HYSTERECTOMY      ?  at age 85, unknown reasons  ? CARPAL TUNNEL RELEASE   jan 2013  ?  left side  ? FOOT  SURGERY Bilateral    ?  ?  ?Current Medications: ?Active Medications  ?No outpatient medications have been marked as taking for the 02/02/22 encounter (Telephone) with Werner Lean, MD.  ?  ?   ?Current Facility-Administered Medications for the 02/02/22 encounter (Telephone) with Werner Lean, MD  ?Medication  ? ceFAZolin (ANCEF) IVPB 1 g/50 mL premix  ? lactated ringers infusion 1,000 mL  ? sodium chloride 0.9 % injection 20 mL  ? sodium chloride flush (NS) 0.9 % injection 20 mL  ? triamcinolone acetonide (KENALOG) 10 MG/ML injection 10 mg  ? triamcinolone acetonide (KENALOG-40) injection 40 mg  ?  ?  ?  ?Allergies:   Hydrocodone-acetaminophen and Hydrocodone  ?  ?Social History  ?  ?     ?Socioeconomic History  ? Marital status: Single  ?    Spouse name: Not on file  ? Number of children: 2  ? Years of education: Not on file  ? Highest education level: Not on file  ?Occupational History  ? 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 Smoking status: Every Day  ?    Packs/day: 0.50  ?    Years: 35.00  ?    Pack years: 17.50  ?    Types: Cigarettes  ? Smokeless tobacco: Never  ?Vaping Use  ? Vaping Use: Never used  ?Substance and Sexual Activity  ? Alcohol use: No  ? Drug use: No  ?    Types: "Crack" cocaine  ?    Comment: last used Oct 2016  ? Sexual activity: Not Currently  ?    Comment: Celebate since 2000  ?Other Topics Concern  ? Not on file  ?Social History Narrative  ?  Born and raised by mom until mom died when she was 89yo. After that she stayed with her aunt. Dad was married to someone else and was never around.  Pt has 1 brother and 1 sister and pt is the middle child. Pt had a very rough childhood. Never married. 2 kids one son died several  yrs ago and other son has Schizophrenia. Pt has graduated HS and has some college. Pt is on disability and is unemployed. 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   ?     ?  Financial assistance approved for 100% discount at  [facility] and has [facility] card  ?  Bonna Gains  September 29, 2010 2:27 PM  ?  ?Social Determinants of Health  ?  ?Financial Resource Strain: Not on file  ?Food Insecurity: Not on file  ?Transportation Needs: Not on file  ?Physical Activity: Not on file  ?Stress: Not on file  ?Social Connections: Not on file  ?  ?  ?Family History: ?The patient's family history includes Alcohol abuse in her brother, father, and sister; Arthritis in an other family member; Diabetes in an other family member; Drug abuse in her brother and sister; Heart attack (age of onset: 80) in her son; Hypertension in an other family member; Schizophrenia in her son; Uterine cancer in her mother. ?  ?ROS:   ?Please see the  history of present illness.    ? All other systems reviewed and are negative. ?  ?EKGs/Labs/Other Studies Reviewed:   ?  ?The following studies were reviewed today: ?  ?EKG:   ?12/08/21: SR non specific TW flattening ?  ?Transthoracic Echocardiogram: ?Date: 06/12/2010 ?Results: ? - Left ventricle: The cavity size was normal. Wall thickness was  ?   normal. Systolic function was normal. The estimated ejection  ?   fraction was in the range of 55% to 60%.  ? - Mitral valve: Mild regurgitation.  ? - Right ventricle: The cavity size was mildly dilated.  ? - Tricuspid valve: Mild-moderate regurgitation.  ? - Pulmonary arteries: PA peak pressure: 19mm Hg (S).  ? Transthoracic echocardiography. M-mode, complete 2D, spectral  ? Doppler, and color Doppler. Height: Height: 180cm. Height: 70.9in.  ? Weight: Weight: 146kg. Weight: 321.2lb. Body mass index: BMI:  ? 45.1kg/m^2. Body surface area: BSA: 2.61m^2. Blood pressure: 127/88.  ? Patient status: Inpatient. Location: Bedside.  ?  ?  ?Recent Labs: ?12/08/2021: ALT 9; B Natriuretic Peptide 705.8; BUN 15; Creatinine, Ser 0.99; Hemoglobin 10.3; Platelets 157; Potassium 4.1; Sodium 138  ?Recent Lipid Panel ?Labs (Brief)  ?     ?   ?Component Value Date/Time  ?  CHOL 180 03/25/2016 0909  ?  TRIG 122  03/25/2016 0909  ?  HDL 39 (L) 03/25/2016 0909  ?  CHOLHDL 4.6 03/25/2016 0909  ?  VLDL 24 03/25/2016 0909  ?  [place] 117 03/25/2016 0909  ?  ?  ?    ?  ?Physical Exam:   ?  ?Virtual visit only ?  ?ASSESSMENT:   ?  ?No diagnosis found. ?  ?PLAN:   ?  ?  ?Severe MR ?- After careful review of history and examination, the risks and benefits of transesophageal echocardiogram have been explained including risks of esophageal damage, perforation (1:10,000 risk), bleeding, pharyngeal hematoma as well as other potential complications associated with conscious sedation including aspiration, arrhythmia, respiratory failure and death. Alternatives to treatment were discussed, questions were answered. Patient is willing to proceed.  ?  ?Jenkins Rouge MD [facility] ?  ?  ?  ?   ?  ?   ?  ?  ?  ? ?Note Details ? ?Ane Payment, Terisa Starr, MD File Time 02/02/2022  3:33 PM  ?Author Type Physician Status Signed  ?Last Editor Werner Lean, MD Service Cardiology  ?Hospital Acct # [id number] Admit Date 02/03/2022  ? ?

## 2022-02-03 NOTE — Transfer of Care (Signed)
Immediate Anesthesia Transfer of Care Note ? ?Patient: [name] ? ?Procedure(s) Performed: TRANSESOPHAGEAL ECHOCARDIOGRAM (TEE) ? ?Patient Location: PACU ? ?Anesthesia Type:MAC ? ?Level of Consciousness: drowsy and patient cooperative ? ?Airway & Oxygen Therapy: Patient Spontanous Breathing and Patient connected to nasal cannula oxygen ? ?Post-op Assessment: Report given to RN and Post -op Vital signs reviewed and stable ? ?Post vital signs: Reviewed and stable ? ?Last Vitals:  ?Vitals Value Taken Time  ?BP 120/63 02/03/22 0956  ?Temp 36.3 ?C 02/03/22 0955  ?Pulse 67 02/03/22 0958  ?Resp 14 02/03/22 0958  ?SpO2 96 % 02/03/22 0958  ?Vitals shown include unvalidated device data. ? ?Last Pain:  ?Vitals:  ? 02/03/22 0955  ?TempSrc: Temporal  ?PainSc: 0-No pain  ?   ? ?  ? ?Complications: No notable events documented. ?

## 2022-02-03 NOTE — Anesthesia Postprocedure Evaluation (Signed)
Anesthesia Post Note ? ?Patient: [name] ? ?Procedure(s) Performed: TRANSESOPHAGEAL ECHOCARDIOGRAM (TEE) ? ?  ? ?Patient location during evaluation: PACU ?Anesthesia Type: MAC ?Level of consciousness: awake and alert ?Pain management: pain level controlled ?Vital Signs Assessment: post-procedure vital signs reviewed and stable ?Respiratory status: spontaneous breathing, nonlabored ventilation and respiratory function stable ?Cardiovascular status: blood pressure returned to baseline ?Postop Assessment: no apparent nausea or vomiting ?Anesthetic complications: no ? ? ?No notable events documented. ? ?Last Vitals:  ?Vitals:  ? 02/03/22 1006 02/03/22 1016  ?BP: 140/81 (!) 151/86  ?Pulse: 64 68  ?Resp: 13 13  ?Temp:    ?SpO2: 96% 98%  ?  ?Last Pain:  ?Vitals:  ? 02/03/22 1016  ?TempSrc:   ?PainSc: 0-No pain  ? ? ?  ?  ?  ?  ?  ?  ? ?Marthenia Rolling ? ? ? ? ?

## 2022-02-03 NOTE — Progress Notes (Signed)
?  Echocardiogram ?Echocardiogram Transesophageal has been performed. ? ?Fidel Levy ?02/03/2022, 10:04 AM ?

## 2022-02-03 NOTE — Discharge Instructions (Signed)

## 2022-02-03 NOTE — Anesthesia Preprocedure Evaluation (Addendum)
Anesthesia Evaluation  ?Patient identified by MRN, date of birth, ID band ?Patient awake ? ? ? ?Reviewed: ?Allergy & Precautions, NPO status , Patient's Chart, lab work & pertinent test results, reviewed documented beta blocker date and time  ? ?History of Anesthesia Complications ?Negative for: history of anesthetic complications ? ?Airway ?Mallampati: III ? ?TM Distance: >3 FB ?Neck ROM: Full ? ? ? Dental ? ?(+) Missing,  ?  ?Pulmonary ?Current Smoker,  ?  ?Pulmonary exam normal ? ? ? ? ? ? ? Cardiovascular ?hypertension, Pt. on medications and Pt. on home beta blockers ?Normal cardiovascular exam ? ?TTE 01/11/22: EF 55-60%, moderate LVH, moderately elevated PASP 57.5 mmHg, severe RAE, moderate to severe TR, mild dilatation of the ascending aorta measuring 38 mm  ?  ?Neuro/Psych ?Anxiety Depression negative neurological ROS ?   ? GI/Hepatic ?GERD  Medicated and Controlled,(+) Hepatitis -, C  ?Endo/Other  ?diabetes, Type 2 ? Renal/GU ?negative Renal ROS  ?negative genitourinary ?  ?Musculoskeletal ? ?(+) Arthritis , Fibromyalgia - ? Abdominal ?  ?Peds ? Hematology ?negative hematology ROS ?(+)   ?Anesthesia Other Findings ?Day of surgery medications reviewed with patient. ? Reproductive/Obstetrics ?negative OB ROS ? ?  ? ? ? ? ? ? ? ? ? ? ? ? ? ?  ?  ? ? ? ? ? ? ? ?Anesthesia Physical ?Anesthesia Plan ? ?ASA: 4 ? ?Anesthesia Plan: MAC  ? ?Post-op Pain Management: Minimal or no pain anticipated  ? ?Induction:  ? ?PONV Risk Score and Plan: Treatment may vary due to age or medical condition and Propofol infusion ? ?Airway Management Planned: Natural Airway and Nasal Cannula ? ?Additional Equipment: None ? ?Intra-op Plan:  ? ?Post-operative Plan:  ? ?Informed Consent: I have reviewed the patients History and Physical, chart, labs and discussed the procedure including the risks, benefits and alternatives for the proposed anesthesia with the patient or authorized representative who has  indicated his/her understanding and acceptance.  ? ? ? ? ? ?Plan Discussed with: CRNA ? ?Anesthesia Plan Comments:   ? ? ? ? ? ?Anesthesia Quick Evaluation ? ?

## 2022-02-08 ENCOUNTER — Telehealth: Payer: Self-pay | Admitting: Internal Medicine

## 2022-02-08 NOTE — Telephone Encounter (Signed)
Called Patient in regard to TEE results ?Discussed Significant eccentric MR and TR ?Answered questions in regard to severity of disease and that I think at some point she will need surgery. ? ?Patient clarifies that she can go to the grocery store, but cannot complete shopping without a buggy due to SOB ? 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 ?Plan:  ?Consented for LHC/RHC ?Will see patient post cath to discuss options including surgical referal ? ?Patient had no further questions. ? ?Rudean Haskell, MD ?Cardiologist ?[facility]  ?[place], [place] ?[place], [place] [address] ?[phone]  ?7:44 AM ? ?

## 2022-02-09 ENCOUNTER — Telehealth: Payer: Self-pay | Admitting: Internal Medicine

## 2022-02-09 NOTE — Telephone Encounter (Signed)
Called Patient ?- we reviewed that the mitral regurgitation may by leading to the phenotype of CHF ?- she is doing better with the diuretic but still has SOB ?- we reviewed the purpose of the [facility] and [place] ? ?Assessment ?- LHC and RHC, based on this will optimize diuretics and/or send to CT surgery as appropriate (if there is RHC Echo discordance will repeat echo in 6 months) ? ? ?Patient had no further questions. ? ?Rudean Haskell, MD ?Cardiologist ?[facility]  ?[place], [place] ?[place], [place] [address] ?[phone]  ?10:27 AM ? ?

## 2022-02-11 ENCOUNTER — Encounter: Payer: Self-pay | Admitting: Internal Medicine

## 2022-02-11 ENCOUNTER — Ambulatory Visit (INDEPENDENT_AMBULATORY_CARE_PROVIDER_SITE_OTHER): Payer: Medicare Other | Admitting: Internal Medicine

## 2022-02-11 ENCOUNTER — Other Ambulatory Visit: Payer: Self-pay

## 2022-02-11 VITALS — BP 126/72 | HR 51 | Ht 71.0 in | Wt 213.3 lb

## 2022-02-11 DIAGNOSIS — I071 Rheumatic tricuspid insufficiency: Secondary | ICD-10-CM | POA: Diagnosis not present

## 2022-02-11 DIAGNOSIS — I340 Nonrheumatic mitral (valve) insufficiency: Secondary | ICD-10-CM | POA: Insufficient documentation

## 2022-02-11 DIAGNOSIS — I10 Essential (primary) hypertension: Secondary | ICD-10-CM

## 2022-02-11 LAB — CBC
Hematocrit: 27.7 % — ABNORMAL LOW (ref 34.0–46.6)
Hemoglobin: 9.4 g/dL — ABNORMAL LOW (ref 11.1–15.9)
MCH: 28.7 pg (ref 26.6–33.0)
MCHC: 33.9 g/dL (ref 31.5–35.7)
MCV: 85 fL (ref 79–97)
Platelets: 186 10*3/uL (ref 150–450)
RBC: 3.28 x10E6/uL — ABNORMAL LOW (ref 3.77–5.28)
RDW: 14.9 % (ref 11.7–15.4)
WBC: 4.4 10*3/uL (ref 3.4–10.8)

## 2022-02-11 LAB — BASIC METABOLIC PANEL
BUN/Creatinine Ratio: 13 (ref 12–28)
BUN: 14 mg/dL (ref 8–27)
CO2: 27 mmol/L (ref 20–29)
Calcium: 8.8 mg/dL (ref 8.7–10.3)
Chloride: 106 mmol/L (ref 96–106)
Creatinine, Ser: 1.09 mg/dL — ABNORMAL HIGH (ref 0.57–1.00)
Glucose: 107 mg/dL — ABNORMAL HIGH (ref 70–99)
Potassium: 5.1 mmol/L (ref 3.5–5.2)
Sodium: 138 mmol/L (ref 134–144)
eGFR: 57 mL/min/{1.73_m2} — ABNORMAL LOW (ref >59)

## 2022-02-11 MED ORDER — SODIUM CHLORIDE 0.9% FLUSH: 3.0000 mL | Freq: Two times a day (BID) | INTRAVENOUS | Status: DC

## 2022-02-11 NOTE — Progress Notes (Signed)
Cardiology Office Note:    Date:  02/11/2022   ID:  [name], DOB 06-25-1961, MRN [id number]  PCP:  [facility] Cardiologist:  None     Referring MD: [facility]   CC: MR follow up  History of Present Illness:    [name] is a 61 y.o. female with a hx of HTN with DM, Fibromyalgia, History of Hep C s/p therapy, Distant substance abuse, tobacco abuse, PAF (potentially in 2018- Xarelto was in per chart review) only who presented 12/30/21 for evaluation.  In interval had severe MR and signifcant TR on TTE, on confirmatory TEE had severe MR and TR and had abnormal inflammatory/rheumatic valves.  She does not have a commitant history of prior infectious sequelae,  Patient notes that she is doing [place].    I am unclear of her respiratory symptoms. She notes that she had leg pain and fatigue and then she is minimally activity. She also notes that when she tries to be active, she feels shortness of breath and DOE.  She still uses a buggy at the grocery store which is her most active activity.  Her limitation in this is SOB.  No chest pain or pressure . No weight gain or leg swelling.  No palpitations or syncope .   Past Medical History:  Diagnosis Date   Arthritis    Cervicalgia    Chondromalacia of patella    Chronic neck pain    Chronic pain syndrome    Depression    secondary to loss of her son at age 46   Diabetes mellitus    Diabetic neuropathy ([place])    DMII (diabetes mellitus, type 2) ([place])    Enthesopathy of hip region    Fibromyalgia    Hep C w/o coma, chronic ([place])    Hepatitis C    diagnosed 2005   Hypertension    Insomnia    Insomnia    Low back pain    Lumbago    Obesity    Pain in joint, upper arm    Primary localized osteoarthrosis, lower leg    Substance abuse ([place])    sober since 2001   Tobacco abuse    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 Check blood sugar once daily. 250.00   albuterol (PROVENTIL HFA;VENTOLIN HFA) 108 (90 Base) MCG/ACT inhaler Inhale 2 puffs into the lungs every 6 (six) hours as needed for wheezing or shortness of breath.    aspirin 81 MG EC tablet Take 81 mg by mouth daily.   atorvastatin (LIPITOR) 20 MG tablet Take 20 mg by mouth daily.   baclofen (LIORESAL) 10 MG tablet Take 10 mg by mouth 2 (two) times daily.   BIOTIN PO Take 1 tablet by mouth daily.   buPROPion (WELLBUTRIN XL) 150 MG 24 hr tablet Take 150 mg by mouth every morning.   calcium-vitamin D (OSCAL WITH D) 500-200 MG-UNIT TABS  tablet Take by mouth.   Cyanocobalamin (VITAMIN B 12 PO) Take 50 mcg by mouth.   dexlansoprazole (DEXILANT) 60 MG capsule Take 60 mg by mouth daily.   ergocalciferol (VITAMIN D2) 50000 UNITS capsule Take 50,000 Units by mouth every Sunday.    ferrous sulfate 325 (65 FE) MG tablet Take 325 mg by mouth daily with breakfast.   furosemide (LASIX) 40 MG tablet Take 1 tablet (40 mg total) by mouth daily.   gabapentin (NEURONTIN) 600 MG tablet Take 600 mg by mouth 3 (three) times daily.   hydrOXYzine (ATARAX/VISTARIL) 25 MG tablet Take 50 mg by mouth daily.   ibuprofen (ADVIL,MOTRIN) 800 MG tablet TAKE 1 TABLET(800 MG) BY MOUTH EVERY 8 HOURS AS NEEDED FOR MILD PAIN OR MODERATE PAIN   linagliptin (TRADJENTA) 5 MG TABS tablet Take 5 mg by mouth daily.   losartan (COZAAR) 100 MG tablet Take 100 mg by mouth daily.   lubiprostone (AMITIZA) 24 MCG capsule Take 24 mcg by mouth  daily as needed.   metoprolol tartrate (LOPRESSOR) 100 MG tablet Take 100 mg by mouth daily.   naloxone (NARCAN) nasal spray 4 mg/0.1 mL Place 1 spray into the nose once.   oxyCODONE (ROXICODONE) 15 MG immediate release tablet Take 1 tablet (15 mg total) by mouth every 4 (four) hours as needed for pain. (Patient taking differently: Take 15 mg by mouth 4 (four) times daily.)   piroxicam (FELDENE) 10 MG capsule TAKE 1 CAPSULE BY MOUTH DAILY   PREMARIN 0.45 MG tablet Take 0.45 mg by mouth daily.   spironolactone (ALDACTONE) 25 MG tablet Take 0.5 tablets (12.5 mg total) by mouth daily.   sucralfate (CARAFATE) 1 g tablet Take 1 g by mouth 4 (four) times daily -  with meals and at bedtime.   traZODone (DESYREL) 100 MG tablet Take 2.5 tablets (250 mg total) by mouth at bedtime as needed for sleep. (Patient taking differently: Take 200 mg by mouth at bedtime.)   Current Facility-Administered Medications for the 02/11/22 encounter (Office Visit) with Werner Lean, MD  Medication   ceFAZolin (ANCEF) IVPB 1 g/50 mL premix   lactated ringers infusion 1,000 mL   sodium chloride 0.9 % injection 20 mL   sodium chloride flush (NS) 0.9 % injection 20 mL   triamcinolone acetonide (KENALOG) 10 MG/ML injection 10 mg   triamcinolone acetonide (KENALOG-40) injection 40 mg     Allergies:   Hydrocodone-acetaminophen and Hydrocodone   Social History   Socioeconomic History   Marital status: Single    Spouse name: Not on file   Number of children: 2   Years of education: Not on file   Highest education level: Not on file  Occupational History   Occupation: CNA    Comment: worked for 15 years  Tobacco Use   Smoking status: Every Day    Packs/day: 0.50    Years: 35.00    Pack years: 17.50    Types: Cigarettes   Smokeless tobacco: Never  Vaping Use   Vaping Use: Never used  Substance and Sexual Activity   Alcohol use: No   Drug use: No    Types: "Crack" cocaine    Comment: last used Oct  2016   Sexual activity: Not Currently    Comment: Celebate since 2000  Other Topics Concern   Not on file  Social History Narrative   Born and raised by mom until mom died when she was 14yo. After that she stayed with her aunt. Dad was married  to someone else and was never around.  Pt has 1 brother and 1 sister and pt is the middle child. Pt had a very rough childhood. Never married. 2 kids one son died several  yrs ago and other son has Schizophrenia. Pt has graduated HS and has some college. Pt is on disability and is unemployed. 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       Financial assistance approved for 100% discount at [facility] and has [facility] card   [employer]  September 29, 2010 2:27 PM   Social Determinants of Health   Financial Resource Strain: Not on file  Food Insecurity: Not on file  Transportation Needs: Not on file  Physical Activity: Not on file  Stress: Not on file  Social Connections: Not on file     Family History: The patient's family history includes Alcohol abuse in her brother, father, and sister; Arthritis in an other family member; Diabetes in an other family member; Drug abuse in her brother and sister; Heart attack (age of onset: 58) in her son; Hypertension in an other family member; Schizophrenia in her son; Uterine cancer in her mother.  ROS:   Please see the history of present illness.     All other systems reviewed and are negative.  EKGs/Labs/Other Studies Reviewed:    The following studies were reviewed today:  EKG:   12/08/21: SR non specific TW flattening  Transthoracic Echocardiogram: Date: 06/12/2010 Results:  - Left ventricle: The cavity size was normal. Wall thickness was     normal. Systolic function was normal. The estimated ejection     fraction was in the range of 55% to 60%.   - Mitral valve: Mild regurgitation.   - Right ventricle: The cavity size was mildly dilated.   - Tricuspid valve: Mild-moderate regurgitation.   - Pulmonary  arteries: PA peak pressure: 1m Hg (S).   Transthoracic echocardiography. M-mode, complete 2D, spectral   Doppler, and color Doppler. Height: Height: 180cm. Height: 70.9in.   Weight: Weight: 146kg. Weight: 321.2lb. Body mass index: BMI:   45.1kg/m^2. Body surface area: BSA: 2.539m. Blood pressure: 127/88.   Patient status: Inpatient. Location: Bedside.    Recent Labs: 12/08/2021: ALT 9; B Natriuretic Peptide 705.8; BUN 15; Creatinine, Ser 0.99; Hemoglobin 10.3; Platelets 157; Potassium 4.1; Sodium 138  Recent Lipid Panel    Component Value Date/Time   CHOL 180 03/25/2016 0909   TRIG 122 03/25/2016 0909   HDL 39 (L) 03/25/2016 0909   CHOLHDL 4.6 03/25/2016 0909   VLDL 24 03/25/2016 0909   LDLCALC 117 03/25/2016 0909        Physical Exam:    VS:  BP 126/72    Pulse (!) 51    Ht '5\' 11"'$  (1.803 m)    Wt 213 lb 4.8 oz (96.8 kg)    LMP 12/06/1976    SpO2 98%    BMI 29.75 kg/m     Wt Readings from Last 3 Encounters:  02/11/22 213 lb 4.8 oz (96.8 kg)  12/30/21 202 lb (91.6 kg)  12/08/21 253 lb 8.5 oz (115 kg)    Gen: No distress  Neck: JVD with prominent V wave a nadir at the clavicle Cardiac: No Rubs or Gallops, holosystolic murmur, distant heart sounds regular bradycardia, +2 radial pulses Respiratory: CTEB, normal  respiratory rate and effort GI: Soft, nontender, non-distended  MS: No  edema;  moves all extremities Integument: Skin feels warm Neuro:  At time of evaluation, alert and oriented  to person/place/time/situation  Psych: Normal affect, patient feels tired  ASSESSMENT:    1. Nonrheumatic mitral valve regurgitation   2. Hypertension, unspecified type   3. Severe mitral regurgitation   4. Severe tricuspid regurgitation     PLAN:    Inflammatory vs Rheumatic Mitral Valve disease - Severe MR and Moderate severe TR HTN with DM Fibromyalgia Distant substance abuse, former Tobacco abuse - we have discussed LHC and RHC - based on results we will likely send her to  [facility] for eval, if no prominent V wave on RHC and widely patent CORS will refer but surgery may be delayed - continue diuretic - has f/u with me 04/05/22.        Medication Adjustments/Labs and Tests Ordered: Current medicines are reviewed at length with the patient today.  Concerns regarding medicines are outlined above.  Orders Placed This Encounter  Procedures   Basic metabolic panel   CBC   EKG 12-Lead   No orders of the defined types were placed in this encounter.   Patient Instructions  Medication Instructions:  Your physician recommends that you continue on your current medications as directed. Please refer to the Current Medication list given to you today.  *If you need a refill on your cardiac medications before your next appointment, please call your pharmacy*   Lab Work: TODAY: CBC, BMET If you have labs (blood work) drawn today and your tests are completely normal, you will receive your results only by: [place] (if you have MyChart) OR A paper copy in the mail If you have any lab test that is abnormal or we need to change your treatment, we will call you to review the results.   Testing/Procedures: Your physician has requested that you have a cardiac catheterization. Cardiac catheterization is used to diagnose and/or treat various heart conditions. Doctors may recommend this procedure for a number of different reasons. The most common reason is to evaluate chest pain. Chest pain can be a symptom of coronary artery disease (CAD), and cardiac catheterization can show whether plaque is narrowing or blocking your hearts arteries. This procedure is also used to evaluate the valves, as well as measure the blood flow and oxygen levels in different parts of your heart. For further information please visit [facility]uge[facility]iesta[facility][email or web address]. Please follow instruction sheet, as given.    Follow-Up: At [facility], you and your health needs are our priority.  As part of our  continuing mission to provide you with exceptional heart care, we have created designated Provider Care Teams.  These Care Teams include your primary Cardiologist (physician) and Advanced Practice Providers (APPs -  Physician Assistants and Nurse Practitioners) who all work together to provide you with the care you need, when you need it.   Your next appointment:   Apr 05, 2022 at 8 am   The format for your next appointment:   In Person  Provider:   Rudean Haskell, MD     Signed, Werner Lean, MD  02/11/2022 10:25 AM    [place]

## 2022-02-11 NOTE — Progress Notes (Signed)
Heart Cath Instructions ?

## 2022-02-11 NOTE — Patient Instructions (Signed)
Medication Instructions:  ?Your physician recommends that you continue on your current medications as directed. Please refer to the Current Medication list given to you today. ? ?*If you need a refill on your cardiac medications before your next appointment, please call your pharmacy* ? ? ?Lab Work: ?TODAY: CBC, BMET ?If you have labs (blood work) drawn today and your tests are completely normal, you will receive your results only by: ?MyChart Message (if you have MyChart) OR ?A paper copy in the mail ?If you have any lab test that is abnormal or we need to change your treatment, we will call you to review the results. ? ? ?Testing/Procedures: ?Your physician has requested that you have a cardiac catheterization. Cardiac catheterization is used to diagnose and/or treat various heart conditions. Doctors may recommend this procedure for a number of different reasons. The most common reason is to evaluate chest pain. Chest pain can be a symptom of coronary artery disease (CAD), and cardiac catheterization can show whether plaque is narrowing or blocking your heart?s arteries. This procedure is also used to evaluate the valves, as well as measure the blood flow and oxygen levels in different parts of your heart. For further information please visit [facility][email or web address]. Please follow instruction sheet, as given. ? ? ? ?Follow-Up: ?At [facility], you and your health needs are our priority.  As part of our continuing mission to provide you with exceptional heart care, we have created designated Provider Care Teams.  These Care Teams include your primary Cardiologist (physician) and Advanced Practice Providers (APPs -  Physician Assistants and Nurse Practitioners) who all work together to provide you with the care you need, when you need it. ? ? ?Your next appointment:   ?Apr 05, 2022 at 8 am  ? ?The format for your next appointment:   ?In Person ? ?Provider:   ?Rudean Haskell, MD  ? ?

## 2022-02-11 NOTE — H&P (View-Only) (Signed)
Cardiology Office Note:    Date:  02/11/2022   ID:  [name], DOB [birth year], MRN [id number]  PCP:  [facility] Cardiologist:  None     Referring MD: [facility]   CC: MR follow up  History of Present Illness:    [name] is a 61 y.o. female with a hx of HTN with DM, Fibromyalgia, History of Hep C s/p therapy, Distant substance abuse, tobacco abuse, PAF (potentially in 2018- Xarelto was in per chart review) only who presented 12/30/21 for evaluation.  In interval had severe MR and signifcant TR on TTE, on confirmatory TEE had severe MR and TR and had abnormal inflammatory/rheumatic valves.  She does not have a commitant history of prior infectious sequelae,  Patient notes that she is doing [name].    I am unclear of her respiratory symptoms. She notes that she had leg pain and fatigue and then she is minimally activity. She also notes that when she tries to be active, she feels shortness of breath and DOE.  She still uses a buggy at the grocery store which is her most active activity.  Her limitation in this is SOB.  No chest pain or pressure . No weight gain or leg swelling.  No palpitations or syncope .   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 Check blood sugar once daily. 250.00   albuterol (PROVENTIL HFA;VENTOLIN HFA) 108 (90 Base) MCG/ACT inhaler Inhale 2 puffs into the lungs every 6 (six) hours as needed for wheezing or shortness of breath.    aspirin 81 MG EC tablet Take 81 mg by mouth daily.   atorvastatin (LIPITOR) 20 MG tablet Take 20 mg by mouth daily.   baclofen (LIORESAL) 10 MG tablet Take 10 mg by mouth 2 (two) times daily.   BIOTIN PO Take 1 tablet by mouth daily.   buPROPion (WELLBUTRIN XL) 150 MG 24 hr tablet Take 150 mg by mouth every morning.   calcium-vitamin D (OSCAL WITH D) 500-200 MG-UNIT TABS  tablet Take by mouth.   Cyanocobalamin (VITAMIN B 12 PO) Take 50 mcg by mouth.   dexlansoprazole (DEXILANT) 60 MG capsule Take 60 mg by mouth daily.   ergocalciferol (VITAMIN D2) 50000 UNITS capsule Take 50,000 Units by mouth every Sunday.    ferrous sulfate 325 (65 FE) MG tablet Take 325 mg by mouth daily with breakfast.   furosemide (LASIX) 40 MG tablet Take 1 tablet (40 mg total) by mouth daily.   gabapentin (NEURONTIN) 600 MG tablet Take 600 mg by mouth 3 (three) times daily.   hydrOXYzine (ATARAX/VISTARIL) 25 MG tablet Take 50 mg by mouth daily.   ibuprofen (ADVIL,MOTRIN) 800 MG tablet TAKE 1 TABLET(800 MG) BY MOUTH EVERY 8 HOURS AS NEEDED FOR MILD PAIN OR MODERATE PAIN   linagliptin (TRADJENTA) 5 MG TABS tablet Take 5 mg by mouth daily.   losartan (COZAAR) 100 MG tablet Take 100 mg by mouth daily.   lubiprostone (AMITIZA) 24 MCG capsule Take 24 mcg by mouth  daily as needed.   metoprolol tartrate (LOPRESSOR) 100 MG tablet Take 100 mg by mouth daily.   naloxone (NARCAN) nasal spray 4 mg/0.1 mL Place 1 spray into the nose once.   oxyCODONE (ROXICODONE) 15 MG immediate release tablet Take 1 tablet (15 mg total) by mouth every 4 (four) hours as needed for pain. (Patient taking differently: Take 15 mg by mouth 4 (four) times daily.)   piroxicam (FELDENE) 10 MG capsule TAKE 1 CAPSULE BY MOUTH DAILY   PREMARIN 0.45 MG tablet Take 0.45 mg by mouth daily.   spironolactone (ALDACTONE) 25 MG tablet Take 0.5 tablets (12.5 mg total) by mouth daily.   sucralfate (CARAFATE) 1 g tablet Take 1 g by mouth 4 (four) times daily -  with meals and at bedtime.   traZODone (DESYREL) 100 MG tablet Take 2.5 tablets (250 mg total) by mouth at bedtime as needed for sleep. (Patient taking differently: Take 200 mg by mouth at bedtime.)   Current Facility-Administered Medications for the 02/11/22 encounter (Office Visit) with Werner Lean, MD  Medication   ceFAZolin (ANCEF) IVPB 1 g/50 mL premix   lactated ringers infusion 1,000 mL   sodium chloride 0.9 % injection 20 mL   sodium chloride flush (NS) 0.9 % injection 20 mL   triamcinolone acetonide (KENALOG) 10 MG/ML injection 10 mg   triamcinolone acetonide (KENALOG-40) injection 40 mg     Allergies:   Hydrocodone-acetaminophen and Hydrocodone   Social History   Socioeconomic History   Marital status: Single    Spouse name: Not on file   Number of children: 2   Years of education: Not on file   Highest education level: Not on file  Occupational History   Occupation: CNA    Comment: worked for 15 years  Tobacco Use   Smoking status: Every Day    Packs/day: 0.50    Years: 35.00    Pack years: 17.50    Types: Cigarettes   Smokeless tobacco: Never  Vaping Use   Vaping Use: Never used  Substance and Sexual Activity   Alcohol use: No   Drug use: No    Types: "Crack" cocaine    Comment: last used Oct  2016   Sexual activity: Not Currently    Comment: Celebate since 2000  Other Topics Concern   Not on file  Social History Narrative   Born and raised by mom until mom died when she was 14yo. After that she stayed with her aunt. Dad was married  to someone else and was never around.  Pt has 1 brother and 1 sister and pt is the middle child. Pt had a very rough childhood. Never married. 2 kids one son died several  yrs ago and other son has Schizophrenia. Pt has graduated HS and has some college. Pt is on disability and is unemployed. 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       Financial assistance approved for 100% discount at [facility] and has [facility] card   [employer]  September 29, 2010 2:27 PM   Social Determinants of Health   Financial Resource Strain: Not on file  Food Insecurity: Not on file  Transportation Needs: Not on file  Physical Activity: Not on file  Stress: Not on file  Social Connections: Not on file     Family History: The patient's family history includes Alcohol abuse in her brother, father, and sister; Arthritis in an other family member; Diabetes in an other family member; Drug abuse in her brother and sister; Heart attack (age of onset: 27) in her son; Hypertension in an other family member; Schizophrenia in her son; Uterine cancer in her mother.  ROS:   Please see the history of present illness.     All other systems reviewed and are negative.  EKGs/Labs/Other Studies Reviewed:    The following studies were reviewed today:  EKG:   12/08/21: SR non specific TW flattening  Transthoracic Echocardiogram: Date: 06/12/2010 Results:  - Left ventricle: The cavity size was normal. Wall thickness was     normal. Systolic function was normal. The estimated ejection     fraction was in the range of 55% to 60%.   - Mitral valve: Mild regurgitation.   - Right ventricle: The cavity size was mildly dilated.   - Tricuspid valve: Mild-moderate regurgitation.   - Pulmonary  arteries: PA peak pressure: 5m Hg (S).   Transthoracic echocardiography. M-mode, complete 2D, spectral   Doppler, and color Doppler. Height: Height: 180cm. Height: 70.9in.   Weight: Weight: 146kg. Weight: 321.2lb. Body mass index: BMI:   45.1kg/m^2. Body surface area: BSA: 2.536m. Blood pressure: 127/88.   Patient status: Inpatient. Location: Bedside.    Recent Labs: 12/08/2021: ALT 9; B Natriuretic Peptide 705.8; BUN 15; Creatinine, Ser 0.99; Hemoglobin 10.3; Platelets 157; Potassium 4.1; Sodium 138  Recent Lipid Panel    Component Value Date/Time   CHOL 180 03/25/2016 0909   TRIG 122 03/25/2016 0909   HDL 39 (L) 03/25/2016 0909   CHOLHDL 4.6 03/25/2016 0909   VLDL 24 03/25/2016 0909   LDLCALC 117 03/25/2016 0909        Physical Exam:    VS:  BP 126/72    Pulse (!) 51    Ht '5\' 11"'$  (1.803 m)    Wt 213 lb 4.8 oz (96.8 kg)    LMP 12/06/1976    SpO2 98%    BMI 29.75 kg/m     Wt Readings from Last 3 Encounters:  02/11/22 213 lb 4.8 oz (96.8 kg)  12/30/21 202 lb (91.6 kg)  12/08/21 253 lb 8.5 oz (115 kg)    Gen: No distress  Neck: JVD with prominent V wave a nadir at the clavicle Cardiac: No Rubs or Gallops, holosystolic murmur, distant heart sounds regular bradycardia, +2 radial pulses Respiratory: CTEB, normal  respiratory rate and effort GI: Soft, nontender, non-distended  MS: No  edema;  moves all extremities Integument: Skin feels warm Neuro:  At time of evaluation, alert and oriented  to person/place/time/situation  Psych: Normal affect, patient feels tired  ASSESSMENT:    1. Nonrheumatic mitral valve regurgitation   2. Hypertension, unspecified type   3. Severe mitral regurgitation   4. Severe tricuspid regurgitation     PLAN:    Inflammatory vs Rheumatic Mitral Valve disease - Severe MR and Moderate severe TR HTN with DM Fibromyalgia Distant substance abuse, former Tobacco abuse - we have discussed LHC and RHC - based on results we will likely send her to  TCTS for eval, if no prominent V wave on RHC and widely patent CORS will refer but surgery may be delayed - continue diuretic - has f/u with me 04/05/22.        Medication Adjustments/Labs and Tests Ordered: Current medicines are reviewed at length with the patient today.  Concerns regarding medicines are outlined above.  Orders Placed This Encounter  Procedures   Basic metabolic panel   CBC   EKG 12-Lead   No orders of the defined types were placed in this encounter.   Patient Instructions  Medication Instructions:  Your physician recommends that you continue on your current medications as directed. Please refer to the Current Medication list given to you today.  *If you need a refill on your cardiac medications before your next appointment, please call your pharmacy*   Lab Work: TODAY: CBC, BMET If you have labs (blood work) drawn today and your tests are completely normal, you will receive your results only by: [place] (if you have MyChart) OR A paper copy in the mail If you have any lab test that is abnormal or we need to change your treatment, we will call you to review the results.   Testing/Procedures: Your physician has requested that you have a cardiac catheterization. Cardiac catheterization is used to diagnose and/or treat various heart conditions. Doctors may recommend this procedure for a number of different reasons. The most common reason is to evaluate chest pain. Chest pain can be a symptom of coronary artery disease (CAD), and cardiac catheterization can show whether plaque is narrowing or blocking your hearts arteries. This procedure is also used to evaluate the valves, as well as measure the blood flow and oxygen levels in different parts of your heart. For further information please visit [email or web address]. Please follow instruction sheet, as given.    Follow-Up: At [facility], you and your health needs are our priority.  As part of our  continuing mission to provide you with exceptional heart care, we have created designated Provider Care Teams.  These Care Teams include your primary Cardiologist (physician) and Advanced Practice Providers (APPs -  Physician Assistants and Nurse Practitioners) who all work together to provide you with the care you need, when you need it.   Your next appointment:   Apr 05, 2022 at 8 am   The format for your next appointment:   In Person  Provider:   Rudean Haskell, MD     Signed, Werner Lean, MD  02/11/2022 10:25 AM    [place]

## 2022-02-12 ENCOUNTER — Other Ambulatory Visit: Payer: Self-pay

## 2022-02-12 ENCOUNTER — Encounter (HOSPITAL_COMMUNITY)
Admission: RE | Disposition: A | Discharge status: Home / Self Care | Payer: Medicare Other | Source: Home / Self Care | Attending: Interventional Cardiology

## 2022-02-12 ENCOUNTER — Ambulatory Visit (HOSPITAL_COMMUNITY)
Admission: RE | Admit: 2022-02-12 | Discharge: 2022-02-12 | Disposition: A | Discharge status: Home / Self Care | Payer: Medicare Other | Attending: Interventional Cardiology | Admitting: Interventional Cardiology

## 2022-02-12 ENCOUNTER — Telehealth: Payer: Self-pay

## 2022-02-12 DIAGNOSIS — I081 Rheumatic disorders of both mitral and tricuspid valves: Secondary | ICD-10-CM | POA: Insufficient documentation

## 2022-02-12 DIAGNOSIS — I480 Paroxysmal atrial fibrillation: Secondary | ICD-10-CM | POA: Diagnosis present

## 2022-02-12 DIAGNOSIS — M797 Fibromyalgia: Secondary | ICD-10-CM | POA: Diagnosis not present

## 2022-02-12 DIAGNOSIS — I340 Nonrheumatic mitral (valve) insufficiency: Secondary | ICD-10-CM | POA: Diagnosis not present

## 2022-02-12 DIAGNOSIS — E119 Type 2 diabetes mellitus without complications: Secondary | ICD-10-CM | POA: Insufficient documentation

## 2022-02-12 DIAGNOSIS — F17210 Nicotine dependence, cigarettes, uncomplicated: Secondary | ICD-10-CM | POA: Diagnosis present

## 2022-02-12 DIAGNOSIS — Z7982 Long term (current) use of aspirin: Secondary | ICD-10-CM | POA: Insufficient documentation

## 2022-02-12 DIAGNOSIS — F4310 Post-traumatic stress disorder, unspecified: Secondary | ICD-10-CM | POA: Diagnosis present

## 2022-02-12 DIAGNOSIS — Z7984 Long term (current) use of oral hypoglycemic drugs: Secondary | ICD-10-CM | POA: Insufficient documentation

## 2022-02-12 DIAGNOSIS — F1910 Other psychoactive substance abuse, uncomplicated: Secondary | ICD-10-CM | POA: Diagnosis not present

## 2022-02-12 DIAGNOSIS — I2722 Pulmonary hypertension due to left heart disease: Secondary | ICD-10-CM | POA: Insufficient documentation

## 2022-02-12 DIAGNOSIS — E1159 Type 2 diabetes mellitus with other circulatory complications: Secondary | ICD-10-CM

## 2022-02-12 DIAGNOSIS — I10 Essential (primary) hypertension: Secondary | ICD-10-CM | POA: Diagnosis not present

## 2022-02-12 DIAGNOSIS — Z79899 Other long term (current) drug therapy: Secondary | ICD-10-CM | POA: Diagnosis not present

## 2022-02-12 DIAGNOSIS — I071 Rheumatic tricuspid insufficiency: Secondary | ICD-10-CM

## 2022-02-12 HISTORY — PX: RIGHT/LEFT HEART CATH AND CORONARY ANGIOGRAPHY: CATH118266

## 2022-02-12 LAB — GLUCOSE, CAPILLARY
Glucose-Capillary: 101 mg/dL — ABNORMAL HIGH (ref 70–99)
Glucose-Capillary: 84 mg/dL (ref 70–99)

## 2022-02-12 LAB — POCT I-STAT EG7
Acid-base deficit: 1 mmol/L (ref 0.0–2.0)
Acid-base deficit: 1 mmol/L (ref 0.0–2.0)
Bicarbonate: 24.9 mmol/L (ref 20.0–28.0)
Bicarbonate: 25.4 mmol/L (ref 20.0–28.0)
Calcium, Ion: 1.22 mmol/L (ref 1.15–1.40)
Calcium, Ion: 1.22 mmol/L (ref 1.15–1.40)
HCT: 30 % — ABNORMAL LOW (ref 36.0–46.0)
HCT: 31 % — ABNORMAL LOW (ref 36.0–46.0)
Hemoglobin: 10.2 g/dL — ABNORMAL LOW (ref 12.0–15.0)
Hemoglobin: 10.5 g/dL — ABNORMAL LOW (ref 12.0–15.0)
O2 Saturation: 69 %
O2 Saturation: 70 %
Potassium: 4.5 mmol/L (ref 3.5–5.1)
Potassium: 4.5 mmol/L (ref 3.5–5.1)
Sodium: 141 mmol/L (ref 135–145)
Sodium: 141 mmol/L (ref 135–145)
TCO2: 26 mmol/L (ref 22–32)
TCO2: 27 mmol/L (ref 22–32)
pCO2, Ven: 45.3 mmHg (ref 44–60)
pCO2, Ven: 46.7 mmHg (ref 44–60)
pH, Ven: 7.343 (ref 7.25–7.43)
pH, Ven: 7.349 (ref 7.25–7.43)
pO2, Ven: 39 mmHg (ref 32–45)
pO2, Ven: 39 mmHg (ref 32–45)

## 2022-02-12 LAB — POCT I-STAT 7, (LYTES, BLD GAS, ICA,H+H)
Acid-base deficit: 1 mmol/L (ref 0.0–2.0)
Bicarbonate: 23.9 mmol/L (ref 20.0–28.0)
Calcium, Ion: 1.12 mmol/L — ABNORMAL LOW (ref 1.15–1.40)
HCT: 29 % — ABNORMAL LOW (ref 36.0–46.0)
Hemoglobin: 9.9 g/dL — ABNORMAL LOW (ref 12.0–15.0)
O2 Saturation: 99 %
Potassium: 4.4 mmol/L (ref 3.5–5.1)
Sodium: 142 mmol/L (ref 135–145)
TCO2: 25 mmol/L (ref 22–32)
pCO2 arterial: 38 mmHg (ref 32–48)
pH, Arterial: 7.406 (ref 7.35–7.45)
pO2, Arterial: 122 mmHg — ABNORMAL HIGH (ref 83–108)

## 2022-02-12 SURGERY — RIGHT/LEFT HEART CATH AND CORONARY ANGIOGRAPHY
Anesthesia: LOCAL

## 2022-02-12 MED ORDER — SODIUM CHLORIDE 0.9% FLUSH: 3.0000 mL | INTRAVENOUS | Status: DC | PRN

## 2022-02-12 MED ORDER — VERAPAMIL HCL 2.5 MG/ML IV SOLN
INTRAVENOUS | Status: AC
  Filled 2022-02-12: qty 2

## 2022-02-12 MED ORDER — HEPARIN (PORCINE) IN NACL 1000-0.9 UT/500ML-% IV SOLN
INTRAVENOUS | Status: DC | PRN
  Administered 2022-02-12 (×2): 500 mL

## 2022-02-12 MED ORDER — LIDOCAINE HCL (PF) 1 % IJ SOLN
INTRAMUSCULAR | Status: DC | PRN
  Administered 2022-02-12 (×2): 2 mL via INTRADERMAL

## 2022-02-12 MED ORDER — ONDANSETRON HCL 4 MG/2ML IJ SOLN: 4.0000 mg | Freq: Four times a day (QID) | INTRAMUSCULAR | Status: DC | PRN

## 2022-02-12 MED ORDER — HEPARIN SODIUM (PORCINE) 1000 UNIT/ML IJ SOLN
INTRAMUSCULAR | Status: DC | PRN
  Administered 2022-02-12: 4500 [IU] via INTRAVENOUS

## 2022-02-12 MED ORDER — LABETALOL HCL 5 MG/ML IV SOLN: 10.0000 mg | INTRAVENOUS | Status: DC | PRN

## 2022-02-12 MED ORDER — SODIUM CHLORIDE 0.9 % IV SOLN: 250.0000 mL | INTRAVENOUS | Status: DC | PRN

## 2022-02-12 MED ORDER — SODIUM CHLORIDE 0.9 % IV SOLN: INTRAVENOUS | Status: DC

## 2022-02-12 MED ORDER — HYDRALAZINE HCL 20 MG/ML IJ SOLN
INTRAMUSCULAR | Status: AC
  Filled 2022-02-12: qty 1

## 2022-02-12 MED ORDER — MIDAZOLAM HCL 2 MG/2ML IJ SOLN
INTRAMUSCULAR | Status: DC | PRN
  Administered 2022-02-12: 1 mg via INTRAVENOUS

## 2022-02-12 MED ORDER — LIDOCAINE HCL (PF) 1 % IJ SOLN
INTRAMUSCULAR | Status: AC
  Filled 2022-02-12: qty 30

## 2022-02-12 MED ORDER — ACETAMINOPHEN 325 MG PO TABS: 650.0000 mg | ORAL_TABLET | ORAL | Status: DC | PRN

## 2022-02-12 MED ORDER — VERAPAMIL HCL 2.5 MG/ML IV SOLN
INTRAVENOUS | Status: DC | PRN
  Administered 2022-02-12: 10 mL via INTRA_ARTERIAL

## 2022-02-12 MED ORDER — HEPARIN (PORCINE) IN NACL 1000-0.9 UT/500ML-% IV SOLN
INTRAVENOUS | Status: AC
  Filled 2022-02-12: qty 1000

## 2022-02-12 MED ORDER — HEPARIN SODIUM (PORCINE) 1000 UNIT/ML IJ SOLN
INTRAMUSCULAR | Status: AC
  Filled 2022-02-12: qty 10

## 2022-02-12 MED ORDER — SODIUM CHLORIDE 0.9 % IV SOLN
250.0000 mL | INTRAVENOUS | Status: DC | PRN
Start: 2022-02-12 — End: 2022-02-12

## 2022-02-12 MED ORDER — FUROSEMIDE 40 MG PO TABS: 40.0000 mg | ORAL_TABLET | Freq: Two times a day (BID) | ORAL | 3 refills | Status: DC

## 2022-02-12 MED ORDER — LABETALOL HCL 5 MG/ML IV SOLN
INTRAVENOUS | Status: AC
  Filled 2022-02-12: qty 4

## 2022-02-12 MED ORDER — SODIUM CHLORIDE 0.9% FLUSH: 3.0000 mL | Freq: Two times a day (BID) | INTRAVENOUS | Status: DC

## 2022-02-12 MED ORDER — SODIUM CHLORIDE 0.9 % IV SOLN
INTRAVENOUS | Status: DC | PRN
Start: 2022-02-12 — End: 2022-02-12
  Administered 2022-02-12: 96 mL/h via INTRAVENOUS

## 2022-02-12 MED ORDER — HYDRALAZINE HCL 20 MG/ML IJ SOLN
10.0000 mg | INTRAMUSCULAR | Status: DC | PRN
  Administered 2022-02-12: 10 mg via INTRAVENOUS
  Filled 2022-02-12: qty 1

## 2022-02-12 MED ORDER — ASPIRIN 81 MG PO CHEW: 81.0000 mg | CHEWABLE_TABLET | ORAL | Status: DC

## 2022-02-12 MED ORDER — MIDAZOLAM HCL 2 MG/2ML IJ SOLN
INTRAMUSCULAR | Status: AC
  Filled 2022-02-12: qty 2

## 2022-02-12 MED ORDER — LABETALOL HCL 5 MG/ML IV SOLN
INTRAVENOUS | Status: DC | PRN
  Administered 2022-02-12: 10 mg via INTRAVENOUS

## 2022-02-12 MED ORDER — IOHEXOL 350 MG/ML SOLN
INTRAVENOUS | Status: DC | PRN
  Administered 2022-02-12: 30 mL via INTRA_ARTERIAL

## 2022-02-12 MED ORDER — HYDRALAZINE HCL 20 MG/ML IJ SOLN
INTRAMUSCULAR | Status: DC | PRN
Start: 2022-02-12 — End: 2022-02-12
  Administered 2022-02-12: 10 mg via INTRAVENOUS

## 2022-02-12 MED ORDER — METOPROLOL TARTRATE 100 MG PO TABS
100.0000 mg | ORAL_TABLET | Freq: Once | ORAL | Status: AC
  Administered 2022-02-12: 100 mg via ORAL
  Filled 2022-02-12: qty 1

## 2022-02-12 SURGICAL SUPPLY — 12 items
CATH BALLN WEDGE 5F 110CM (CATHETERS) ×2 IMPLANT
CATH INFINITI 5 FR JL3.5 (CATHETERS) ×2 IMPLANT
CATH INFINITI JR4 5F (CATHETERS) ×2 IMPLANT
DEVICE RAD COMP TR BAND LRG (VASCULAR PRODUCTS) ×2 IMPLANT
GLIDESHEATH SLEND A-KIT 6F 22G (SHEATH) ×2 IMPLANT
GUIDEWIRE INQWIRE 1.5J.035X260 (WIRE) IMPLANT
INQWIRE 1.5J .035X260CM (WIRE) ×3
KIT HEART LEFT (KITS) ×3 IMPLANT
PACK CARDIAC CATHETERIZATION (CUSTOM PROCEDURE TRAY) ×3 IMPLANT
SHEATH GLIDE SLENDER 4/5FR (SHEATH) ×2 IMPLANT
TRANSDUCER W/STOPCOCK (MISCELLANEOUS) ×3 IMPLANT
TUBING CIL FLEX 10 FLL-RA (TUBING) ×3 IMPLANT

## 2022-02-12 NOTE — Telephone Encounter (Signed)
Called pt reviewed the following MD recommendation:  "Cath looked ok from coronary stand point.  ?I'd like to increase her lasix to 40 mg PO BID.  BMP in two weeks.  ?I'd like to refer her to cardiothoracic surgery for surgical eval.  ?Thanks,  ?MAC ". ? ?Pt is agreeable to plan.  Will come in for lab work on 02/26/22.  Advised that a scheduler will call to schedule CTS appointment.  All questions were answered.  ?

## 2022-02-12 NOTE — Interval H&P Note (Signed)
Cath Lab Visit (complete for each Cath Lab visit) ? ?Clinical Evaluation Leading to the Procedure:  ? ?ACS: No. ? ?Non-ACS:   ? ?Anginal Classification: CCS [name] ? ?Anti-ischemic medical therapy: No Therapy ? ?Non-Invasive Test Results: No non-invasive testing performed ? ?Prior CABG: No previous CABG ? ? ? ? ? ?History and Physical Interval Note: ? ?02/12/2022 ?1:13 PM ? ?[name]  has presented today for surgery, with the diagnosis of mitral regurgitation.  The various methods of treatment have been discussed with the patient and family. After consideration of risks, benefits and other options for treatment, the patient has consented to  Procedure(s): ?RIGHT/LEFT HEART CATH AND CORONARY ANGIOGRAPHY (N/A) as a surgical intervention.  The patient's history has been reviewed, patient examined, no change in status, stable for surgery.  I have reviewed the patient's chart and labs.  Questions were answered to the patient's satisfaction.   ? ? ?[name] ? ? ?

## 2022-02-12 NOTE — Telephone Encounter (Signed)
-----   Message from Werner Lean, MD sent at 02/12/2022  5:01 PM EST ----- ?Regarding: CT Surgery ?Cath looked ok from coronary stand point. ? ?I'd like to increase her lasix to 40 mg PO BID.  BMP in two weeks. ? ?I'd like to refer her to cardiothoracic surgery for surgical eval. ? ?Thanks, ?MAC ? ?

## 2022-02-12 NOTE — Progress Notes (Signed)
Patient stated that she feels like her heart is fluttering. Nicole Julian, MD at bedside. Nicole Julian, MD gave verbal order for home dose of metoprolol tartrate 100 mg PO once. Orders placed and followed. Will continue to monitor.  ?

## 2022-02-12 NOTE — Discharge Instructions (Signed)
Please take medications as prescribed including PM doses of Metoprolol and others. ?

## 2022-02-15 ENCOUNTER — Encounter (HOSPITAL_COMMUNITY): Payer: Self-pay | Admitting: Interventional Cardiology

## 2022-02-17 ENCOUNTER — Telehealth: Payer: Self-pay

## 2022-02-17 NOTE — Telephone Encounter (Signed)
Called pt in regards to furosemide and metoprolol questions.  Advised pt that furosemide dose was increased to 40 mg PO BID on 02/12/22.  No changes to metoprolol made by Dr. Gasper Sells.  Pt had no further questions.  ?

## 2022-02-26 ENCOUNTER — Other Ambulatory Visit: Payer: Medicare Other

## 2022-03-04 ENCOUNTER — Telehealth: Payer: Self-pay | Admitting: Internal Medicine

## 2022-03-04 NOTE — Telephone Encounter (Signed)
Called pt informed that previous lab work was in preparation for heart cath.  Lab work needed now is for increase in furosemide.  Informed pt that we need to check kidney function and electrolytes d/t medication increase.  Pt reports will come in on 03/12/22 for lab work.  No further questions or concerns.  ?

## 2022-03-04 NOTE — Telephone Encounter (Signed)
?  Pt wanted to know why she has to get another blood work when she just got it on 03/09 ?

## 2022-03-11 IMAGING — DX DG CHEST 2V
2 series · 2 of 2 positions shown · non-contrast
Comparison: 12/05/2021.

CLINICAL DATA: Shortness of breath, difficulty breathing.

EXAM:
CHEST - 2 VIEW

[chest pa]
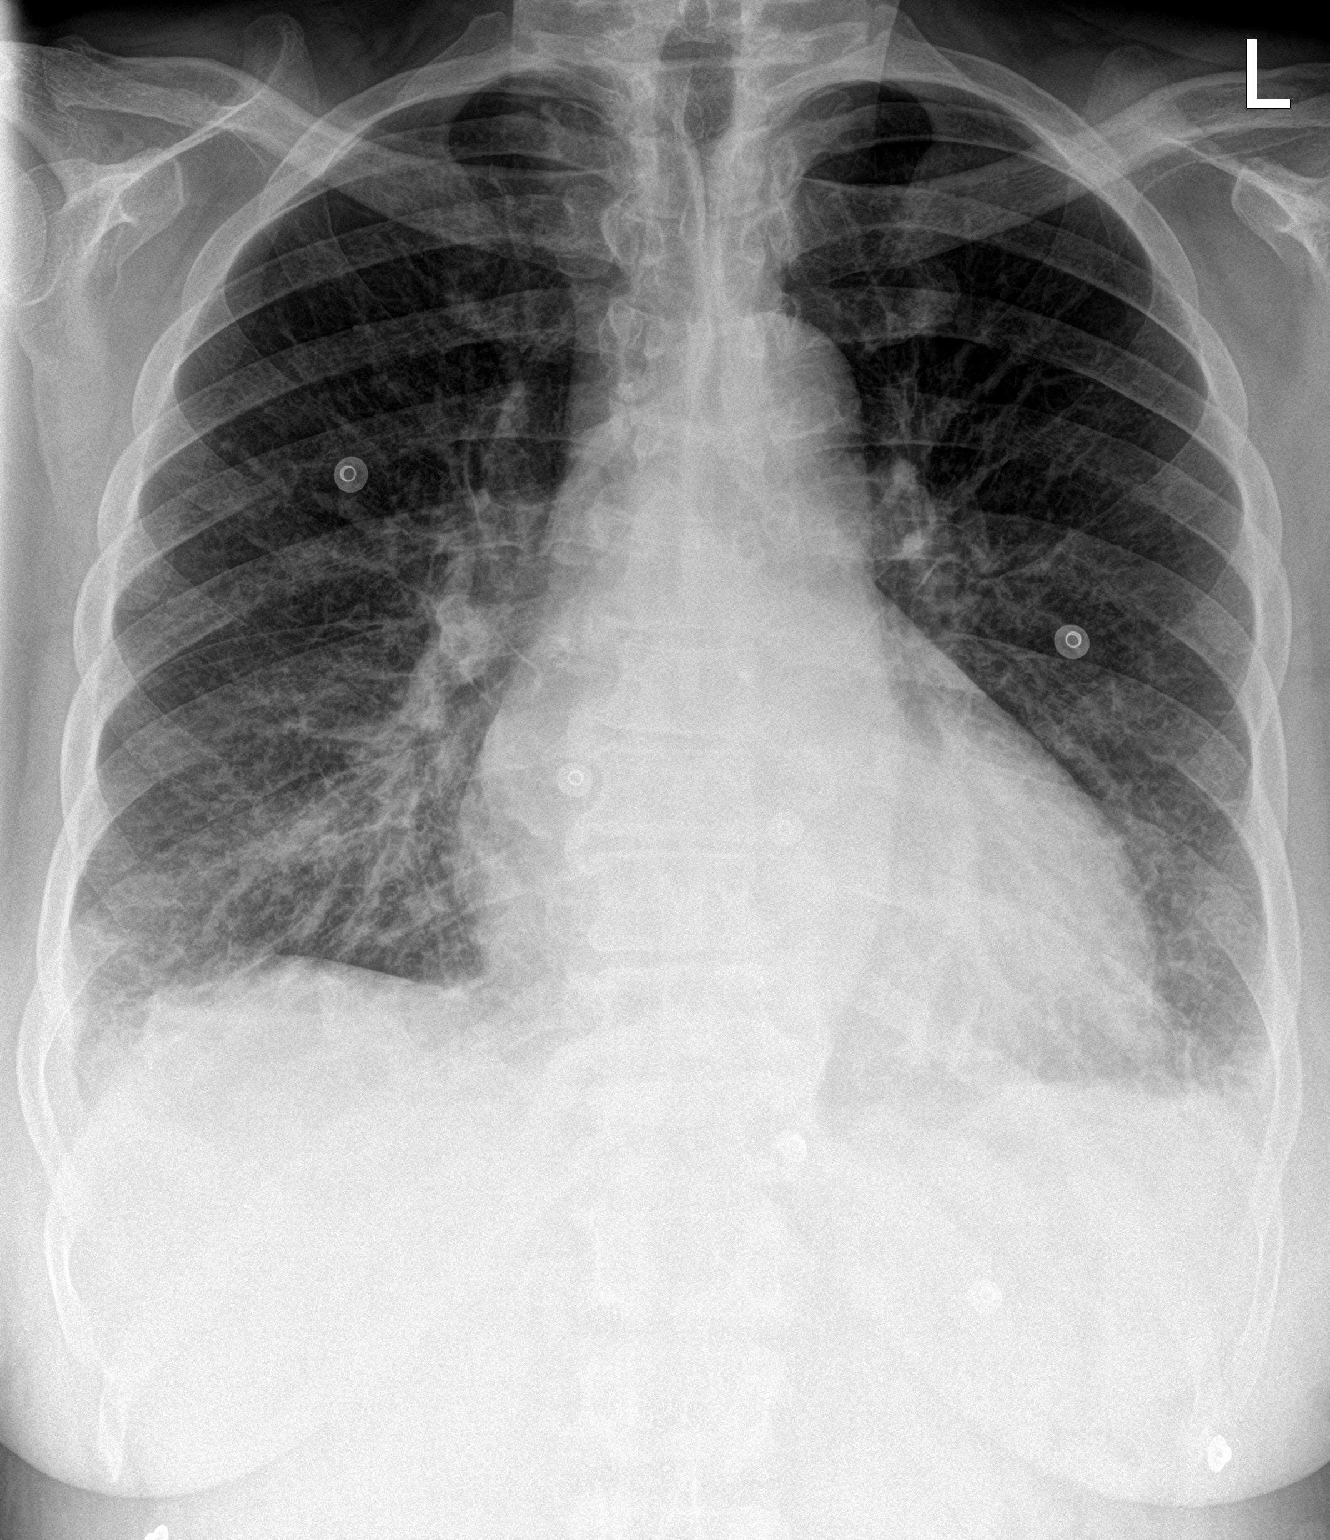

[chest lat]
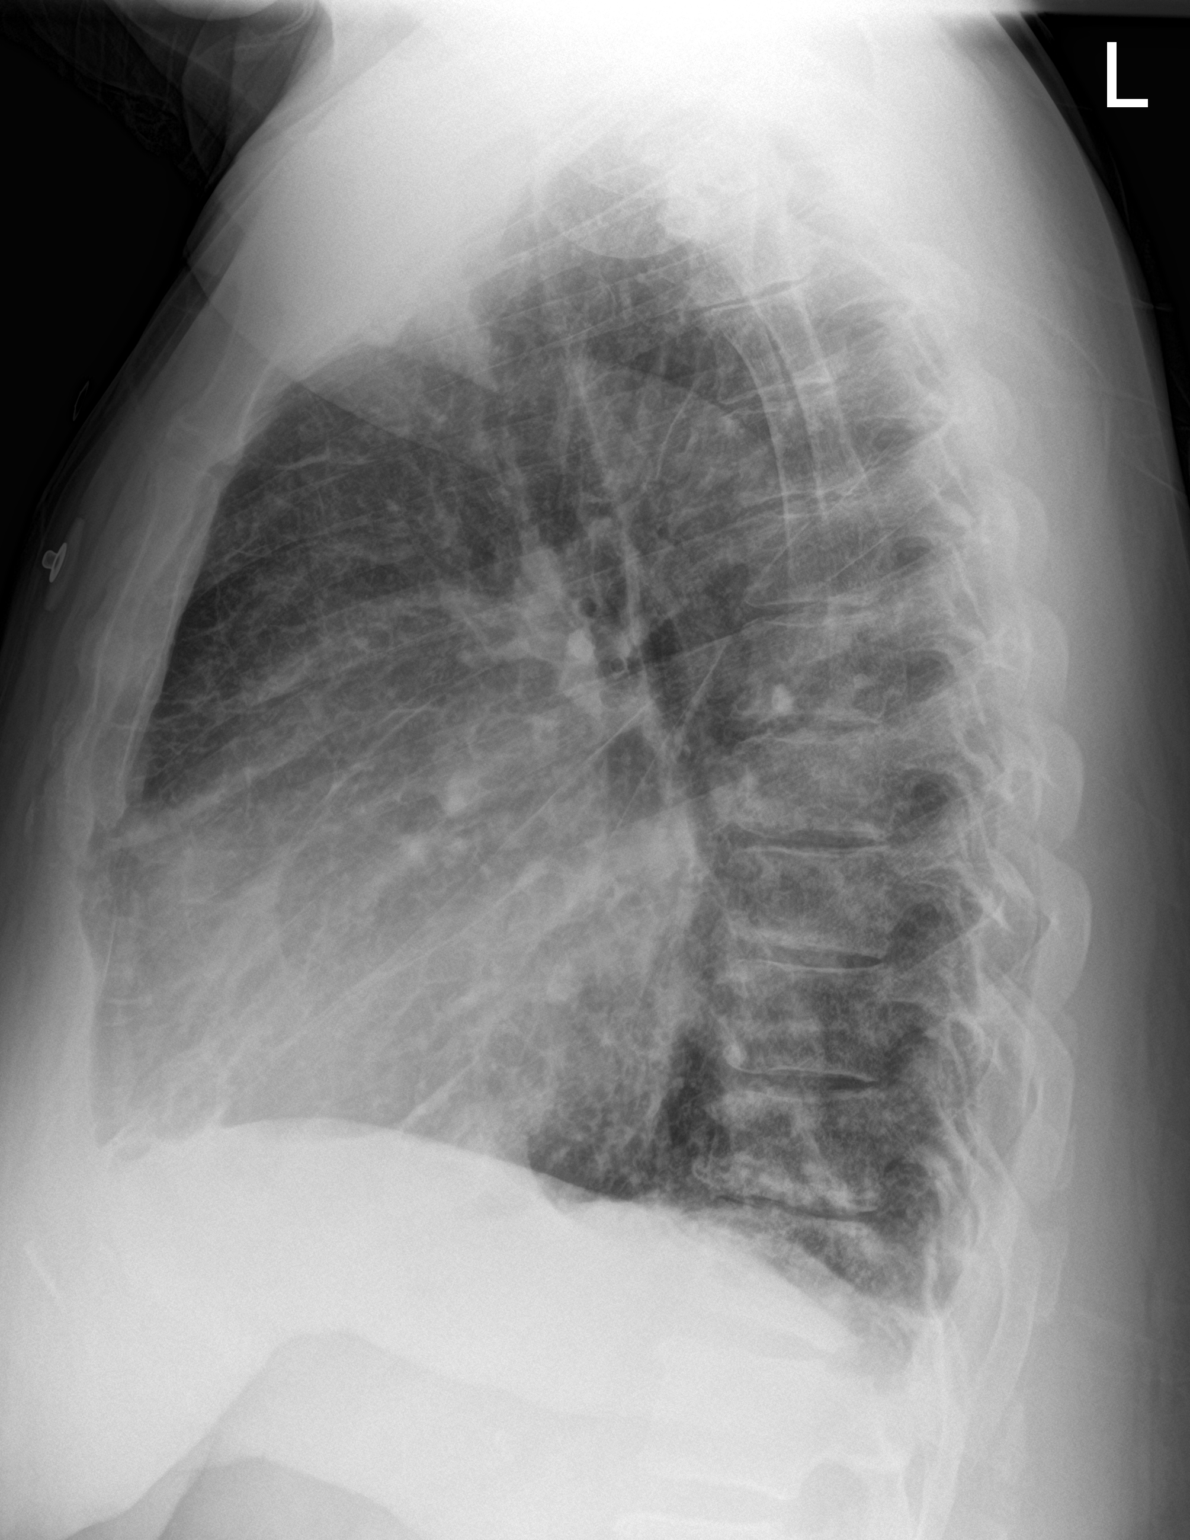

[2 of 2 positions shown; findings below may reference images not displayed]

FINDINGS: The heart is enlarged. Atherosclerotic calcification of the aorta is
noted. Pulmonary vasculature is mildly distended. Interstitial
prominence is noted bilaterally mild airspace disease is noted at
the lung bases. There are small bilateral pleural effusions. No
pneumothorax. No acute osseous abnormality.
IMPRESSION: 1. Cardiomegaly with mildly distended pulmonary vasculature.
2. Interstitial and airspace opacities predominantly at the lung
bases, possible edema or infiltrate.
3. Small bilateral pleural effusions.

## 2022-03-12 ENCOUNTER — Other Ambulatory Visit: Payer: Medicare Other | Admitting: *Deleted

## 2022-03-12 DIAGNOSIS — E1159 Type 2 diabetes mellitus with other circulatory complications: Secondary | ICD-10-CM

## 2022-03-12 DIAGNOSIS — I071 Rheumatic tricuspid insufficiency: Secondary | ICD-10-CM

## 2022-03-12 DIAGNOSIS — I340 Nonrheumatic mitral (valve) insufficiency: Secondary | ICD-10-CM

## 2022-03-12 LAB — BASIC METABOLIC PANEL
BUN/Creatinine Ratio: 18 (ref 12–28)
BUN: 23 mg/dL (ref 8–27)
CO2: 21 mmol/L (ref 20–29)
Calcium: 8.3 mg/dL — ABNORMAL LOW (ref 8.7–10.3)
Chloride: 108 mmol/L — ABNORMAL HIGH (ref 96–106)
Creatinine, Ser: 1.26 mg/dL — ABNORMAL HIGH (ref 0.57–1.00)
Glucose: 100 mg/dL — ABNORMAL HIGH (ref 70–99)
Potassium: 5.1 mmol/L (ref 3.5–5.2)
Sodium: 143 mmol/L (ref 134–144)
eGFR: 48 mL/min/{1.73_m2} — ABNORMAL LOW (ref >59)

## 2022-03-23 ENCOUNTER — Institutional Professional Consult (permissible substitution) (INDEPENDENT_AMBULATORY_CARE_PROVIDER_SITE_OTHER): Payer: Medicare Other | Admitting: Thoracic Surgery (Cardiothoracic Vascular Surgery)

## 2022-03-23 ENCOUNTER — Encounter: Payer: Self-pay | Admitting: Thoracic Surgery (Cardiothoracic Vascular Surgery)

## 2022-03-23 VITALS — BP 174/105 | HR 70 | Resp 20 | Ht 71.0 in | Wt 211.0 lb

## 2022-03-23 DIAGNOSIS — I340 Nonrheumatic mitral (valve) insufficiency: Secondary | ICD-10-CM

## 2022-03-23 NOTE — H&P (View-Only) (Signed)
[facility] [place] ?Referring Provider is [name], Nicole, * ? ?Chief Complaint  ?Patient presents with  ? Mitral Regurgitation  ?  Initial surgical consult, ECHO TEE 3/1 cath 3/10  ? ? ?HPI: [name] is sent for consultation regarding severe mitral regurgitation. ? ?[name] is a 61 year old woman with a past history of paroxysmal atrial fibrillation, hepatitis C (treated), hypertension, type 2 diabetes complicated by neuropathy, fibromyalgia, arthritis, chronic neck pain, and chronic low back pain.  She had a history of mitral regurgitation.  She presented with shortness of breath in January.  She saw Dr. Julieanne [name].  An echocardiogram was done which showed severe mitral regurgitation and moderate to severe tricuspid regurgitation.  There was preserved left ventricular function.  She had right and left heart catheterization.  She had normal coronaries.  She did have mildly elevated pulmonary artery pressure she had a large V wave. ? ?She continues to have shortness of breath with exertion.  Has improved with medical therapy but has not resolved.  She also intermittently has orthopnea.  Denies peripheral edema.  No chest pain, pressure, or tightness. ? ? ?Past Medical History:  ?Diagnosis Date  ? Arthritis   ? Cervicalgia   ? Chondromalacia of patella   ? Chronic neck pain   ? Chronic pain syndrome   ? Depression   ? secondary to loss of her son at age 73  ? Diabetes mellitus   ? Diabetic neuropathy ([place])   ? DMII (diabetes mellitus, type 2) ([place])   ? Enthesopathy of hip region   ? Fibromyalgia   ? Hep C w/o coma, chronic (HCC)   ? Hepatitis C   ? diagnosed 2005  ? Hypertension   ? Insomnia   ? Insomnia   ? Low back pain   ? Lumbago   ? Obesity   ? Pain in joint, upper arm   ? Primary localized osteoarthrosis, lower leg   ? Substance abuse ([place])   ? sober since 2001  ? Tobacco abuse   ? 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 ? ?Past Surgical History:  ?Procedure Laterality  Date  ? ABDOMINAL HYSTERECTOMY    ? at age 67, unknown reasons  ? CARPAL TUNNEL RELEASE  jan 2013  ? left side  ? FOOT SURGERY Bilateral   ? RIGHT/LEFT HEART CATH AND CORONARY ANGIOGRAPHY N/A 02/12/2022  ? Procedure: RIGHT/LEFT HEART CATH AND CORONARY ANGIOGRAPHY;  Surgeon: Nicole Crome, MD;  Location: [facility];  Service: Cardiovascular;  Laterality: N/A;  ? TEE WITHOUT CARDIOVERSION N/A 02/03/2022  ? Procedure: TRANSESOPHAGEAL ECHOCARDIOGRAM (TEE);  Surgeon: Nicole Hector, MD;  Location: [facility];  Service: Cardiovascular;  Laterality: N/A;  ? ? ?Family History  ?Problem Relation Age of Onset  ? Uterine cancer Mother   ? Alcohol abuse Father   ? Alcohol abuse Sister   ? Drug abuse Sister   ? Drug abuse Brother   ? Alcohol abuse Brother   ? Schizophrenia Son   ? Diabetes Other   ? Arthritis Other   ? Hypertension Other   ? Heart attack Son 75  ?     Died suddenly  ? ? ?Social History ?Social History  ? ?Tobacco Use  ? Smoking status: Every Day  ?  Packs/day: 0.50  ?  Years: 35.00  ?  Pack years: 17.50  ?  Types: Cigarettes  ? Smokeless tobacco: Never  ?Vaping Use  ? Vaping Use: Never used  ?Substance Use  Topics  ? Alcohol use: No  ? Drug use: No  ?  Types: "Crack" cocaine  ?  Comment: last used Oct 2016  ? ? ?Current Outpatient Medications  ?Medication Sig Dispense Refill  ? ACCU-CHEK FASTCLIX LANCETS MISC 1 each by Does not apply route daily. Check blood sugar once daily. 250.00 102 each 4  ? albuterol (PROVENTIL HFA;VENTOLIN HFA) 108 (90 Base) MCG/ACT inhaler Inhale 2 puffs into the lungs every 6 (six) hours as needed for wheezing or shortness of breath.     ? aspirin 81 MG EC tablet Take 81 mg by mouth daily.    ? atorvastatin (LIPITOR) 20 MG tablet Take 20 mg by mouth daily.    ? baclofen (LIORESAL) 10 MG tablet Take 10 mg by mouth 2 (two) times daily.    ? BIOTIN PO Take 1 tablet by mouth daily.    ? buPROPion (WELLBUTRIN XL) 150 MG 24 hr tablet Take 150 mg by mouth every morning.    ?  Cyanocobalamin (VITAMIN B 12 PO) Take 50 mcg by mouth.    ? dexlansoprazole (DEXILANT) 60 MG capsule Take 60 mg by mouth daily.    ? ergocalciferol (VITAMIN D2) 50000 UNITS capsule Take 50,000 Units by mouth every Sunday.     ? ferrous sulfate 325 (65 FE) MG tablet Take 325 mg by mouth daily with breakfast.    ? furosemide (LASIX) 40 MG tablet Take 1 tablet (40 mg total) by mouth 2 (two) times daily. 180 tablet 3  ? gabapentin (NEURONTIN) 600 MG tablet Take 600 mg by mouth 3 (three) times daily.    ? hydrOXYzine (ATARAX/VISTARIL) 25 MG tablet Take 50 mg by mouth daily.    ? ibuprofen (ADVIL,MOTRIN) 800 MG tablet TAKE 1 TABLET(800 MG) BY MOUTH EVERY 8 HOURS AS NEEDED FOR MILD PAIN OR MODERATE PAIN 30 tablet 0  ? linagliptin (TRADJENTA) 5 MG TABS tablet Take 5 mg by mouth daily.    ? losartan (COZAAR) 100 MG tablet Take 100 mg by mouth daily.    ? lubiprostone (AMITIZA) 24 MCG capsule Take 24 mcg by mouth daily as needed.    ? meloxicam (MOBIC) 15 MG tablet Take 15 mg by mouth daily.    ? metoprolol tartrate (LOPRESSOR) 100 MG tablet Take 100 mg by mouth daily.    ? naloxone (NARCAN) nasal spray 4 mg/0.1 mL Place 1 spray into the nose once.    ? oxyCODONE (ROXICODONE) 15 MG immediate release tablet Take 1 tablet (15 mg total) by mouth every 4 (four) hours as needed for pain. (Patient taking differently: Take 15 mg by mouth 4 (four) times daily.) 10 tablet 0  ? piroxicam (FELDENE) 10 MG capsule TAKE 1 CAPSULE BY MOUTH DAILY 30 capsule PRN  ? PREMARIN 0.45 MG tablet Take 0.45 mg by mouth daily.    ? spironolactone (ALDACTONE) 25 MG tablet Take 0.5 tablets (12.5 mg total) by mouth daily. 45 tablet 3  ? sucralfate (CARAFATE) 1 g tablet Take 1 g by mouth 4 (four) times daily -  with meals and at bedtime.    ? traZODone (DESYREL) 100 MG tablet Take 2.5 tablets (250 mg total) by mouth at bedtime as needed for sleep. (Patient taking differently: Take 200 mg by mouth at bedtime.) 225 tablet 0  ? calcium-vitamin D (OSCAL WITH  D) 500-200 MG-UNIT TABS tablet Take by mouth.    ? glucose blood (ACCU-CHEK SMARTVIEW) test strip Check blood sugar once daily. 250.00 50 each 12  ? ?  Current Facility-Administered Medications  ?Medication Dose Route Frequency Provider Last Rate Last Admin  ? ceFAZolin (ANCEF) IVPB 1 g/50 mL premix  1 g Intravenous Once Mohammed Kindle, MD      ? lactated ringers infusion 1,000 mL  1,000 mL Intravenous Continuous Mohammed Kindle, MD      ? sodium chloride 0.9 % injection 20 mL  20 mL Other Once Mohammed Kindle, MD      ? sodium chloride flush (NS) 0.9 % injection 20 mL  20 mL Other Once Mohammed Kindle, MD      ? sodium chloride flush (NS) 0.9 % injection 3 mL  3 mL Intravenous Q12H Chandrasekhar, Mahesh A, MD      ? triamcinolone acetonide (KENALOG) 10 MG/ML injection 10 mg  10 mg Other Once Evelina Bucy, DPM      ? triamcinolone acetonide (KENALOG-40) injection 40 mg  40 mg Other Once Mohammed Kindle, MD      ? ? ?Allergies  ?Allergen Reactions  ? Hydrocodone-Acetaminophen Itching  ? Hydrocodone Itching  ?  Tolerates oxycodone   ? ? ?Review of Systems  ?Constitutional:  Positive for activity change and fatigue. Negative for unexpected weight change.  ?HENT:  Positive for dental problem. Negative for trouble swallowing and voice change.   ?Eyes:  Positive for visual disturbance.  ?Respiratory:  Positive for shortness of breath.   ?Cardiovascular:  Negative for chest pain, palpitations and leg swelling.  ?Gastrointestinal:  Positive for abdominal pain (reflux).  ?Genitourinary:  Positive for frequency. Negative for dysuria.  ?Musculoskeletal:  Positive for arthralgias, myalgias and neck pain.  ?Neurological:  Positive for dizziness, numbness and headaches. Negative for weakness.  ?Hematological:  Bruises/bleeds easily.  ?Psychiatric/Behavioral:  Positive for dysphoric mood. The patient is nervous/anxious.   ?All other systems reviewed and are negative. ? ?BP (!) 174/105 (BP Location: Left Arm, Patient Position:  Sitting)   Pulse 70   Resp 20   Ht '5\' 11"'$  (1.803 m)   Wt 211 lb (95.7 kg)   LMP 12/06/1976   SpO2 96% Comment: RA  BMI 29.43 kg/m?  ?Physical Exam ?Vitals reviewed.  ?Constitutional:   ?   General: She is not in acute

## 2022-03-23 NOTE — Progress Notes (Signed)
[facility] is [facility] [place] ?Referring Provider is [place], [place], * ? ?Chief Complaint  ?Patient presents with  ? Mitral Regurgitation  ?  Initial surgical consult, ECHO TEE 3/1 cath 3/10  ? ? ?HPI: [name] is sent for consultation regarding severe mitral regurgitation. ? ?[name] is a 61 year old woman with a past history of paroxysmal atrial fibrillation, hepatitis C (treated), hypertension, type 2 diabetes complicated by neuropathy, fibromyalgia, arthritis, chronic neck pain, and chronic low back pain.  She had a history of mitral regurgitation.  She presented with shortness of breath in January.  She saw Dr. Julieanne Manson.  An echocardiogram was done which showed severe mitral regurgitation and moderate to severe tricuspid regurgitation.  There was preserved left ventricular function.  She had right and left heart catheterization.  She had normal coronaries.  She did have mildly elevated pulmonary artery pressure she had a large V wave. ? ?She continues to have shortness of breath with exertion.  Has improved with medical therapy but has not resolved.  She also intermittently has orthopnea.  Denies peripheral edema.  No chest pain, pressure, or tightness. ? ? ?Past Medical History:  ?Diagnosis Date  ? Arthritis   ? Cervicalgia   ? Chondromalacia of patella   ? Chronic neck pain   ? Chronic pain syndrome   ? Depression   ? secondary to loss of her son at age 60  ? Diabetes mellitus   ? Diabetic neuropathy ([place])   ? DMII (diabetes mellitus, type 2) ([place])   ? Enthesopathy of hip region   ? Fibromyalgia   ? Hep C w/o coma, chronic (HCC)   ? Hepatitis C   ? diagnosed 2005  ? Hypertension   ? Insomnia   ? Insomnia   ? Low back pain   ? Lumbago   ? Obesity   ? Pain in joint, upper arm   ? Primary localized osteoarthrosis, lower leg   ? 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 Tobacco abuse   ? 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 ? ?Past Surgical History:  ?Procedure Laterality  Date  ? ABDOMINAL HYSTERECTOMY    ? at age 27, unknown reasons  ? CARPAL TUNNEL RELEASE  jan 2013  ? left side  ? FOOT SURGERY Bilateral   ? RIGHT/LEFT HEART CATH AND CORONARY ANGIOGRAPHY N/A 02/12/2022  ? Procedure: RIGHT/LEFT HEART CATH AND CORONARY ANGIOGRAPHY;  Surgeon: Belva Crome, MD;  Location: [facility];  Service: Cardiovascular;  Laterality: N/A;  ? TEE WITHOUT CARDIOVERSION N/A 02/03/2022  ? Procedure: TRANSESOPHAGEAL ECHOCARDIOGRAM (TEE);  Surgeon: Josue Hector, MD;  Location: [facility]COPY;  Service: Cardiovascular;  Laterality: N/A;  ? ? ?Family History  ?Problem Relation Age of Onset  ? Uterine cancer Mother   ? Alcohol abuse Father   ? Alcohol abuse Sister   ? Drug abuse Sister   ? Drug abuse Brother   ? Alcohol abuse Brother   ? Schizophrenia Son   ? Diabetes Other   ? Arthritis Other   ? Hypertension Other   ? Heart attack Son 40  ?     Died suddenly  ? ? ?Social History ?Social History  ? ?Tobacco Use  ? Smoking status: Every Day  ?  Packs/day: 0.50  ?  Years: 35.00  ?  Pack years: 17.50  ?  Types: Cigarettes  ? Smokeless tobacco: Never  ?Vaping Use  ? Vaping Use: Never used  ?Substance Use  Topics  ? Alcohol use: No  ? Drug use: No  ?  Types: "Crack" cocaine  ?  Comment: last used Oct 2016  ? ? ?Current Outpatient Medications  ?Medication Sig Dispense Refill  ? ACCU-CHEK FASTCLIX LANCETS MISC 1 each by Does not apply route daily. Check blood sugar once daily. 250.00 102 each 4  ? albuterol (PROVENTIL HFA;VENTOLIN HFA) 108 (90 Base) MCG/ACT inhaler Inhale 2 puffs into the lungs every 6 (six) hours as needed for wheezing or shortness of breath.     ? aspirin 81 MG EC tablet Take 81 mg by mouth daily.    ? atorvastatin (LIPITOR) 20 MG tablet Take 20 mg by mouth daily.    ? baclofen (LIORESAL) 10 MG tablet Take 10 mg by mouth 2 (two) times daily.    ? BIOTIN PO Take 1 tablet by mouth daily.    ? buPROPion (WELLBUTRIN XL) 150 MG 24 hr tablet Take 150 mg by mouth every morning.    ?  Cyanocobalamin (VITAMIN B 12 PO) Take 50 mcg by mouth.    ? dexlansoprazole (DEXILANT) 60 MG capsule Take 60 mg by mouth daily.    ? ergocalciferol (VITAMIN D2) 50000 UNITS capsule Take 50,000 Units by mouth every Sunday.     ? ferrous sulfate 325 (65 FE) MG tablet Take 325 mg by mouth daily with breakfast.    ? furosemide (LASIX) 40 MG tablet Take 1 tablet (40 mg total) by mouth 2 (two) times daily. 180 tablet 3  ? gabapentin (NEURONTIN) 600 MG tablet Take 600 mg by mouth 3 (three) times daily.    ? hydrOXYzine (ATARAX/VISTARIL) 25 MG tablet Take 50 mg by mouth daily.    ? ibuprofen (ADVIL,MOTRIN) 800 MG tablet TAKE 1 TABLET(800 MG) BY MOUTH EVERY 8 HOURS AS NEEDED FOR MILD PAIN OR MODERATE PAIN 30 tablet 0  ? linagliptin (TRADJENTA) 5 MG TABS tablet Take 5 mg by mouth daily.    ? losartan (COZAAR) 100 MG tablet Take 100 mg by mouth daily.    ? lubiprostone (AMITIZA) 24 MCG capsule Take 24 mcg by mouth daily as needed.    ? meloxicam (MOBIC) 15 MG tablet Take 15 mg by mouth daily.    ? metoprolol tartrate (LOPRESSOR) 100 MG tablet Take 100 mg by mouth daily.    ? naloxone (NARCAN) nasal spray 4 mg/0.1 mL Place 1 spray into the nose once.    ? oxyCODONE (ROXICODONE) 15 MG immediate release tablet Take 1 tablet (15 mg total) by mouth every 4 (four) hours as needed for pain. (Patient taking differently: Take 15 mg by mouth 4 (four) times daily.) 10 tablet 0  ? piroxicam (FELDENE) 10 MG capsule TAKE 1 CAPSULE BY MOUTH DAILY 30 capsule PRN  ? PREMARIN 0.45 MG tablet Take 0.45 mg by mouth daily.    ? spironolactone (ALDACTONE) 25 MG tablet Take 0.5 tablets (12.5 mg total) by mouth daily. 45 tablet 3  ? sucralfate (CARAFATE) 1 g tablet Take 1 g by mouth 4 (four) times daily -  with meals and at bedtime.    ? traZODone (DESYREL) 100 MG tablet Take 2.5 tablets (250 mg total) by mouth at bedtime as needed for sleep. (Patient taking differently: Take 200 mg by mouth at bedtime.) 225 tablet 0  ? calcium-vitamin D (OSCAL WITH  D) 500-200 MG-UNIT TABS tablet Take by mouth.    ? glucose blood (ACCU-CHEK SMARTVIEW) test strip Check blood sugar once daily. 250.00 50 each 12  ? ?  Current Facility-Administered Medications  ?Medication Dose Route Frequency Provider Last Rate Last Admin  ? ceFAZolin (ANCEF) IVPB 1 g/50 mL premix  1 g Intravenous Once Mohammed Kindle, MD      ? lactated ringers infusion 1,000 mL  1,000 mL Intravenous Continuous Mohammed Kindle, MD      ? sodium chloride 0.9 % injection 20 mL  20 mL Other Once Mohammed Kindle, MD      ? sodium chloride flush (NS) 0.9 % injection 20 mL  20 mL Other Once Mohammed Kindle, MD      ? sodium chloride flush (NS) 0.9 % injection 3 mL  3 mL Intravenous Q12H Chandrasekhar, Mahesh A, MD      ? triamcinolone acetonide (KENALOG) 10 MG/ML injection 10 mg  10 mg Other Once Evelina Bucy, DPM      ? triamcinolone acetonide (KENALOG-40) injection 40 mg  40 mg Other Once Mohammed Kindle, MD      ? ? ?Allergies  ?Allergen Reactions  ? Hydrocodone-Acetaminophen Itching  ? Hydrocodone Itching  ?  Tolerates oxycodone   ? ? ?Review of Systems  ?Constitutional:  Positive for activity change and fatigue. Negative for unexpected weight change.  ?HENT:  Positive for dental problem. Negative for trouble swallowing and voice change.   ?Eyes:  Positive for visual disturbance.  ?Respiratory:  Positive for shortness of breath.   ?Cardiovascular:  Negative for chest pain, palpitations and leg swelling.  ?Gastrointestinal:  Positive for abdominal pain (reflux).  ?Genitourinary:  Positive for frequency. Negative for dysuria.  ?Musculoskeletal:  Positive for arthralgias, myalgias and neck pain.  ?Neurological:  Positive for dizziness, numbness and headaches. Negative for weakness.  ?Hematological:  Bruises/bleeds easily.  ?Psychiatric/Behavioral:  Positive for dysphoric mood. The patient is nervous/anxious.   ?All other systems reviewed and are negative. ? ?BP (!) 174/105 (BP Location: Left Arm, Patient Position:  Sitting)   Pulse 70   Resp 20   Ht '5\' 11"'$  (1.803 m)   Wt 211 lb (95.7 kg)   LMP 12/06/1976   SpO2 96% Comment: RA  BMI 29.43 kg/m?  ?Physical Exam ?Vitals reviewed.  ?Constitutional:   ?   General: She is not in acute

## 2022-04-04 NOTE — Progress Notes (Signed)
?Cardiology Office Note:   ? ?Date:  04/05/2022  ? ?ID:  [name], DOB [birth year], MRN [id number] ? ?PCP:  [facility] ?  ?[facility] Providers ?Cardiologist:  None    ? ?Referring MD: [facility]  ? ?CC: MR follow up ? ?History of Present Illness:   ? ?[name] is a 61 y.o. female with a hx of HTN with DM, Fibromyalgia, History of Hep C s/p therapy, Distant substance abuse, tobacco abuse, PAF (potentially in 2018- Xarelto was in per chart review) only who presented 12/30/21 for evaluation.  In interval had severe MR and signifcant TR on TTE, on confirmatory TEE had severe MR and TR and had abnormal inflammatory/rheumatic valves.  She does not have a commitant history of prior infectious sequelae.  Had widely Patent CORS, has 03/23/22 appointment with [facility]CTS and has been planned for surgery. ? ?Patient notes that she is doing well.   ?Pending surgery and needs to get a tooth pulled, then she will get surgery. ?There are no interval hospital/ED visit.   ? ?No chest pain or pressure .  No SOB/DOE and no PND/Orthopnea.  No weight gain or leg swelling (hast lost 5 lbs this is intentional).  No palpitations or syncope. Does best on this diuretic regimen.   ? ?Ambulatory blood pressure not done. ? ? ?Past Medical History:  ?Diagnosis Date  ? Arthritis   ? Cervicalgia   ? Chondromalacia of patella   ? Chronic neck pain   ? Chronic pain syndrome   ? Depression   ? secondary to loss of her son at age 34  ? Diabetes mellitus   ? Diabetic neuropathy ([place])   ? DMII (diabetes mellitus, type 2) ([place])   ? Enthesopathy of hip region   ? Fibromyalgia   ? Hep C w/o coma, chronic (HCC)   ? Hepatitis C   ? diagnosed 2005  ? Hypertension   ? Insomnia   ? Insomnia   ? Low back pain   ? Lumbago   ? Obesity   ? Pain in joint, upper arm   ? Primary localized osteoarthrosis, lower leg   ? 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 Tobacco abuse   ? 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 ? ?Past Surgical History:  ?Procedure Laterality Date  ? ABDOMINAL HYSTERECTOMY    ? at age 47, unknown reasons  ? CARPAL TUNNEL RELEASE  jan 2013  ? left side  ? FOOT SURGERY Bilateral   ? RIGHT/LEFT HEART CATH AND CORONARY ANGIOGRAPHY N/A 02/12/2022  ? Procedure: RIGHT/LEFT HEART CATH AND CORONARY ANGIOGRAPHY;  Surgeon: Belva Crome, MD;  Location: [facility];  Service: Cardiovascular;  Laterality: N/A;  ? TEE WITHOUT CARDIOVERSION N/A 02/03/2022  ? Procedure: TRANSESOPHAGEAL ECHOCARDIOGRAM (TEE);  Surgeon: Josue Hector, MD;  Location: [facility] ENDOSCOPY;  Service: Cardiovascular;  Laterality: N/A;  ? ? ?Current Medications: ?Current Meds  ?Medication Sig  ? ACCU-CHEK FASTCLIX LANCETS MISC 1 each by Does not apply route daily. Check blood sugar once daily. 250.00  ? albuterol (PROVENTIL HFA;VENTOLIN HFA) 108 (90 Base) MCG/ACT inhaler Inhale 2 puffs into the lungs every 6 (six) hours as needed for wheezing or shortness of breath.   ? aspirin 81 MG EC tablet Take 81 mg by mouth daily.  ? atorvastatin (LIPITOR) 20 MG tablet Take 20 mg by mouth daily.  ? baclofen (LIORESAL) 10 MG tablet Take 10 mg by mouth 2 (two)  times daily.  ? BIOTIN PO Take 1 tablet by mouth daily.  ? buPROPion (WELLBUTRIN XL) 300 MG 24 hr tablet daily.  ? calcium-vitamin D (OSCAL WITH D) 500-5 MG-MCG tablet Take 1 tablet by mouth daily.  ? Cyanocobalamin (VITAMIN B 12 PO) Take 50 mcg by mouth.  ? dexlansoprazole (DEXILANT) 60 MG capsule Take 60 mg by mouth daily.  ? diclofenac Sodium (VOLTAREN) 1 % GEL as needed.  ? ergocalciferol (VITAMIN D2) 50000 UNITS capsule Take 50,000 Units by mouth every Sunday.   ? furosemide (LASIX) 40 MG tablet Take 1 tablet (40 mg total) by mouth 2 (two) times daily.  ? gabapentin (NEURONTIN) 600 MG tablet Take 600 mg by mouth 3 (three) times daily.  ? hydrOXYzine (ATARAX/VISTARIL) 25 MG tablet Take 50 mg by mouth daily.  ? linagliptin (TRADJENTA) 5 MG TABS tablet Take 5 mg by mouth daily.  ? losartan (COZAAR)  100 MG tablet Take 100 mg by mouth daily.  ? lubiprostone (AMITIZA) 24 MCG capsule Take 24 mcg by mouth daily as needed.  ? meloxicam (MOBIC) 15 MG tablet Take 15 mg by mouth daily.  ? metoprolol tartrate (LOPRESSOR) 100 MG tablet Take 100 mg by mouth daily.  ? naloxone (NARCAN) nasal spray 4 mg/0.1 mL Place 1 spray into the nose once.  ? oxyCODONE (ROXICODONE) 15 MG immediate release tablet Take 1 tablet (15 mg total) by mouth every 4 (four) hours as needed for pain.  ? piroxicam (FELDENE) 10 MG capsule TAKE 1 CAPSULE BY MOUTH DAILY  ? PREMARIN 0.45 MG tablet Take 0.45 mg by mouth daily.  ? spironolactone (ALDACTONE) 25 MG tablet Take 0.5 tablets (12.5 mg total) by mouth daily.  ? sucralfate (CARAFATE) 1 g tablet Take 1 g by mouth 4 (four) times daily -  with meals and at bedtime.  ? traZODone (DESYREL) 100 MG tablet Take 2.5 tablets (250 mg total) by mouth at bedtime as needed for sleep. (Patient taking differently: Take 200 mg by mouth at bedtime.)  ? ?Current Facility-Administered Medications for the 04/05/22 encounter (Office Visit) with Werner Lean, MD  ?Medication  ? ceFAZolin (ANCEF) IVPB 1 g/50 mL premix  ? lactated ringers infusion 1,000 mL  ? sodium chloride 0.9 % injection 20 mL  ? sodium chloride flush (NS) 0.9 % injection 20 mL  ? sodium chloride flush (NS) 0.9 % injection 3 mL  ? triamcinolone acetonide (KENALOG) 10 MG/ML injection 10 mg  ? triamcinolone acetonide (KENALOG-40) injection 40 mg  ?  ? ?Allergies:   Hydrocodone-acetaminophen and Hydrocodone  ? ?Social History  ? ?Socioeconomic History  ? Marital status: Single  ?  Spouse name: Not on file  ? Number of children: 2  ? Years of education: Not on file  ? Highest education level: Not on file  ?Occupational History  ? Occupation: CNA  ?  Comment: worked for 15 years  ?Tobacco Use  ? Smoking status: Every Day  ?  Packs/day: 0.50  ?  Years: 35.00  ?  Pack years: 17.50  ?  Types: Cigarettes  ? Smokeless tobacco: Never  ?Vaping Use  ?  Vaping Use: Never used  ?Substance and Sexual Activity  ? Alcohol use: No  ? Drug use: No  ?  Types: "Crack" cocaine  ?  Comment: last used Oct 2016  ? Sexual activity: Not Currently  ?  Comment: Celebate since 2000  ?Other Topics Concern  ? Not on file  ?Social History Narrative  ? Born and raised  by mom until mom died when she was 14yo. After that she stayed with her aunt. Dad was married to someone else and was never around.  Pt has 1 brother and 1 sister and pt is the middle child. Pt had a very rough childhood. Never married. 2 kids one son died several  yrs ago and other son has Schizophrenia. Pt has graduated HS and has some college. Pt is on disability and is unemployed. 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   ?   ? Financial assistance approved for 100% discount at [facility] and has [facility] card  ? Bonna Gains  September 29, 2010 2:27 PM  ? ?Social Determinants of Health  ? ?Financial Resource Strain: Not on file  ?Food Insecurity: Not on file  ?Transportation Needs: Not on file  ?Physical Activity: Not on file  ?Stress: Not on file  ?Social Connections: Not on file  ?  ? ?Family History: ?The patient's family history includes Alcohol abuse in her brother, father, and sister; Arthritis in an other family member; Diabetes in an other family member; Drug abuse in her brother and sister; Heart attack (age of onset: 7) in her son; Hypertension in an other family member; Schizophrenia in her son; Uterine cancer in her mother. ? ?ROS:   ?Please see the history of present illness.    ? All other systems reviewed and are negative. ? ?EKGs/Labs/Other Studies Reviewed:   ? ?The following studies were reviewed today: ? ?EKG:   ?12/08/21: SR non specific TW flattening ? ?Transthoracic Echocardiogram: ?Date: 06/12/2010 ?Results: ? - Left ventricle: The cavity size was normal. Wall thickness was  ?   normal. Systolic function was normal. The estimated ejection  ?   fraction was in the range of 55% to 60%.  ? - Mitral valve: Mild  regurgitation.  ? - Right ventricle: The cavity size was mildly dilated.  ? - Tricuspid valve: Mild-moderate regurgitation.  ? - Pulmonary arteries: PA peak pressure: 4m Hg (S).  ? Transthoracic echocardiography.

## 2022-04-05 ENCOUNTER — Encounter: Payer: Self-pay | Admitting: Internal Medicine

## 2022-04-05 ENCOUNTER — Ambulatory Visit (INDEPENDENT_AMBULATORY_CARE_PROVIDER_SITE_OTHER): Payer: Medicare Other | Admitting: Internal Medicine

## 2022-04-05 VITALS — BP 92/70 | HR 74 | Ht 71.0 in | Wt 206.4 lb

## 2022-04-05 DIAGNOSIS — I340 Nonrheumatic mitral (valve) insufficiency: Secondary | ICD-10-CM

## 2022-04-05 DIAGNOSIS — E1159 Type 2 diabetes mellitus with other circulatory complications: Secondary | ICD-10-CM

## 2022-04-05 DIAGNOSIS — I959 Hypotension, unspecified: Secondary | ICD-10-CM | POA: Diagnosis not present

## 2022-04-05 DIAGNOSIS — I071 Rheumatic tricuspid insufficiency: Secondary | ICD-10-CM | POA: Diagnosis not present

## 2022-04-05 DIAGNOSIS — I152 Hypertension secondary to endocrine disorders: Secondary | ICD-10-CM

## 2022-04-05 NOTE — Patient Instructions (Signed)
Medication Instructions:  ?Your physician has recommended you make the following change in your medication:  ?DO NOT TAKE: Losartan and Furosemide (Lasix) today  ?IF your Blood Pressure continues to be 90/70's  ?STOP: Losartan and Spironolactone (Aldactone) ? ?*If you need a refill on your cardiac medications before your next appointment, please call your pharmacy* ? ? ?Lab Work: ?NONE ?If you have labs (blood work) drawn today and your tests are completely normal, you will receive your results only by: ?MyChart Message (if you have MyChart) OR ?A paper copy in the mail ?If you have any lab test that is abnormal or we need to change your treatment, we will call you to review the results. ? ? ?Testing/Procedures: ?NONE ? ? ?Follow-Up: ?At [facility], you and your health needs are our priority.  As part of our continuing mission to provide you with exceptional heart care, we have created designated Provider Care Teams.  These Care Teams include your primary Cardiologist (physician) and Advanced Practice Providers (APPs -  Physician Assistants and Nurse Practitioners) who all work together to provide you with the care you need, when you need it. ? ? ?Your next appointment:   ?8-9  month(s) ? ?The format for your next appointment:   ?In Person ? ?Provider:   ?Rudean Haskell, MD  ?}  ? ?Important Information About Sugar ? ? ? ? ?  ?

## 2022-04-08 ENCOUNTER — Other Ambulatory Visit: Payer: Self-pay | Admitting: *Deleted

## 2022-04-08 ENCOUNTER — Encounter: Payer: Self-pay | Admitting: *Deleted

## 2022-04-08 DIAGNOSIS — I340 Nonrheumatic mitral (valve) insufficiency: Secondary | ICD-10-CM

## 2022-04-08 DIAGNOSIS — I071 Rheumatic tricuspid insufficiency: Secondary | ICD-10-CM

## 2022-04-14 NOTE — Progress Notes (Signed)
Surgical Instructions ? ? ? Your procedure is scheduled on Monday 04/19/22.. ? Report to [facility] Main Entrance "A" at 5:30 A.M., then check in with the Admitting office. ? Call this number if you have problems the morning of surgery: ? [phone] ? ? If you have any questions prior to your surgery date call [phone]: Open Monday-Friday 8am-4pm ? ? ? Remember: ? Do not eat or drink after midnight the night before your surgery ? ?  ? Take these medicines the morning of surgery with A SIP OF WATER:  ?atorvastatin (LIPITOR) ?baclofen (LIORESAL) ?buPROPion (WELLBUTRIN XL)  ?dexlansoprazole (DEXILANT) ?gabapentin (NEURONTIN)  ?hydrOXYzine (ATARAX/VISTARIL)  ?[employer] Shirley Friar) eye drops ?methocarbamol (ROBAXIN) ?metoprolol tartrate (LOPRESSOR)  ?omeprazole (PRILOSEC) ?PREMARIN ? ?IF NEEDED: ?albuterol (PROVENTIL HFA;VENTOLIN HFA) ?lubiprostone (AMITIZA)  ?oxyCODONE (ROXICODONE) ? ?As of today, STOP taking any Aspirin (unless otherwise instructed by your surgeon) Aleve, Naproxen, Ibuprofen, Motrin, Advil, Goody's, BC's, all herbal medications, fish oil, diclofenac Sodium (VOLTAREN), meloxicam (MOBIC) and all vitamins. ? ?WHAT DO I DO ABOUT MY DIABETES MEDICATION? ? ? ?Do not take oral diabetes medicines (pills) the morning of surgery. ?   ?THE MORNING OF SURGERY, do not take linagliptin (TRADJENTA). ? ?The day of surgery, do not take other diabetes injectables, including Byetta (exenatide), Bydureon (exenatide ER), Victoza (liraglutide), or Trulicity (dulaglutide). ? ?If your CBG is greater than 220 mg/dL, you may take ? of your sliding scale (correction) dose of insulin. ? ? ?HOW TO MANAGE YOUR DIABETES ?BEFORE AND AFTER SURGERY ? ?Why is it important to control my blood sugar before and after surgery? ?Improving blood sugar levels before and after surgery helps healing and can limit problems. ?A way of improving blood sugar control is eating a healthy diet by: ? Eating less sugar and carbohydrates ? Increasing  activity/exercise ? Talking with your doctor about reaching your blood sugar goals ?High blood sugars (greater than 180 mg/dL) can raise your risk of infections and slow your recovery, so you will need to focus on controlling your diabetes during the weeks before surgery. ?Make sure that the doctor who takes care of your diabetes knows about your planned surgery including the date and location. ? ?How do I manage my blood sugar before surgery? ?Check your blood sugar at least 4 times a day, starting 2 days before surgery, to make sure that the level is not too high or low. ? ?Check your blood sugar the morning of your surgery when you wake up and every 2 hours until you get to the Short Stay unit. ? ?If your blood sugar is less than 70 mg/dL, you will need to treat for low blood sugar: ?Do not take insulin. ?Treat a low blood sugar (less than 70 mg/dL) with ? cup of clear juice (cranberry or apple), 4 glucose tablets, OR glucose gel. ?Recheck blood sugar in 15 minutes after treatment (to make sure it is greater than 70 mg/dL). If your blood sugar is not greater than 70 mg/dL on recheck, call [phone] for further instructions. ?Report your blood sugar to the short stay nurse when you get to Short Stay. ? ?If you are admitted to the hospital after surgery: ?Your blood sugar will be checked by the staff and you will probably be given insulin after surgery (instead of oral diabetes medicines) to make sure you have good blood sugar levels. ?The goal for blood sugar control after surgery is 80-180 mg/dL. ? ?         ?Do not wear jewelry or  makeup ?Do not wear lotions, powders, perfumes/colognes, or deodorant. ?Do not shave 48 hours prior to surgery.   ?Do not bring valuables to the hospital. ?Do not wear nail polish, gel polish, artificial nails, or any other type of covering on natural nails (fingers and toes) ?If you have artificial nails or gel coating that need to be removed by a nail salon, please have this  removed prior to surgery. Artificial nails or gel coating may interfere with anesthesia's ability to adequately monitor your vital signs. ? ?[facility] is not responsible for any belongings or valuables. .  ? ?Do NOT Smoke (Tobacco/Vaping)  24 hours prior to your procedure ? ?If you use a CPAP at night, you may bring your mask for your overnight stay. ?  ?Contacts, glasses, hearing aids, dentures or partials may not be worn into surgery, please bring cases for these belongings ?  ?For patients admitted to the hospital, discharge time will be determined by your treatment team. ?  ?Patients discharged the day of surgery will not be allowed to drive home, and someone needs to stay with them for 24 hours. ? ? ?SURGICAL WAITING ROOM VISITATION ?Patients having surgery or a procedure in a hospital may have two support people. ?Children under the age of 44 must have an adult with them who is not the patient. ?They may stay in the waiting area during the procedure and may switch out with other visitors. If the patient needs to stay at the hospital during part of their recovery, the visitor guidelines for inpatient rooms apply. ? ?Please refer to the [facility] website for the visitor guidelines for Inpatients (after your surgery is over and you are in a regular room).  ? ? ? ? ? ?Special instructions:   ? ?Oral Hygiene is also important to reduce your risk of infection.  Remember - BRUSH YOUR TEETH THE MORNING OF SURGERY WITH YOUR REGULAR TOOTHPASTE ? ? ?[facility]- Preparing For Surgery ? ?Before surgery, you can play an important role. Because skin is not sterile, your skin needs to be as free of germs as possible. You can reduce the number of germs on your skin by washing with CHG (chlorahexidine gluconate) Soap before surgery.  CHG is an antiseptic cleaner which kills germs and bonds with the skin to continue killing germs even after washing.   ? ? ?Please do not use if you have an allergy to CHG or antibacterial  soaps. If your skin becomes reddened/irritated stop using the CHG.  ?Do not shave (including legs and underarms) for at least 48 hours prior to first CHG shower. It is OK to shave your face. ? ?Please follow these instructions carefully. ?  ? ? Shower the NIGHT BEFORE SURGERY and the MORNING OF SURGERY with CHG Soap.  ? If you chose to wash your hair, wash your hair first as usual with your normal shampoo. After you shampoo, rinse your hair and body thoroughly to remove the shampoo.  Then [employer] and genitals (private parts) with your normal soap and rinse thoroughly to remove soap. ? ?After that Use CHG Soap as you would any other liquid soap. You can apply CHG directly to the skin and wash gently with a scrungie or a clean washcloth.  ? ?Apply the CHG Soap to your body ONLY FROM THE NECK DOWN.  Do not use on open wounds or open sores. Avoid contact with your eyes, ears, mouth and genitals (private parts). Wash Face and genitals (private parts)  with your normal soap.  ? ?Wash thoroughly, paying special attention to the area where your surgery will be performed. ? ?Thoroughly rinse your body with warm water from the neck down. ? ?DO NOT shower/wash with your normal soap after using and rinsing off the CHG Soap. ? ?Pat yourself dry with a CLEAN TOWEL. ? ?Wear CLEAN PAJAMAS to bed the night before surgery ? ?Place CLEAN SHEETS on your bed the night before your surgery ? ?DO NOT SLEEP WITH PETS. ? ? ?Day of Surgery: ?Take a shower with CHG soap. ?Wear Clean/Comfortable clothing the morning of surgery ?Do not apply any deodorants/lotions.   ?Remember to brush your teeth WITH YOUR REGULAR TOOTHPASTE. ? ? ? ?If you received a COVID test during your pre-op visit, it is requested that you wear a mask when out in public, stay away from anyone that may not be feeling well, and notify your surgeon if you develop symptoms. If you have been in contact with anyone that has tested positive in the last 10 days, please notify  your surgeon. ? ?  ?Please read over the following fact sheets that you were given.  ? ?

## 2022-04-15 ENCOUNTER — Encounter (HOSPITAL_COMMUNITY)
Admission: RE | Admit: 2022-04-15 | Discharge: 2022-04-15 | Disposition: A | Discharge status: Home / Self Care | Payer: Medicare Other | Source: Ambulatory Visit | Attending: Thoracic Surgery (Cardiothoracic Vascular Surgery) | Admitting: Thoracic Surgery (Cardiothoracic Vascular Surgery)

## 2022-04-15 ENCOUNTER — Other Ambulatory Visit: Payer: Self-pay

## 2022-04-15 ENCOUNTER — Ambulatory Visit (HOSPITAL_COMMUNITY)
Admission: RE | Admit: 2022-04-15 | Discharge: 2022-04-15 | Disposition: A | Discharge status: Home / Self Care | Payer: Medicare Other | Source: Ambulatory Visit | Attending: Thoracic Surgery (Cardiothoracic Vascular Surgery) | Admitting: Thoracic Surgery (Cardiothoracic Vascular Surgery)

## 2022-04-15 ENCOUNTER — Encounter (HOSPITAL_COMMUNITY): Payer: Self-pay

## 2022-04-15 VITALS — BP 148/93 | HR 73 | Temp 98.3°F | Resp 17 | Ht 71.0 in | Wt 212.0 lb

## 2022-04-15 DIAGNOSIS — I340 Nonrheumatic mitral (valve) insufficiency: Secondary | ICD-10-CM | POA: Insufficient documentation

## 2022-04-15 DIAGNOSIS — M542 Cervicalgia: Secondary | ICD-10-CM | POA: Insufficient documentation

## 2022-04-15 DIAGNOSIS — I480 Paroxysmal atrial fibrillation: Secondary | ICD-10-CM | POA: Insufficient documentation

## 2022-04-15 DIAGNOSIS — Z20822 Contact with and (suspected) exposure to covid-19: Secondary | ICD-10-CM | POA: Diagnosis not present

## 2022-04-15 DIAGNOSIS — I071 Rheumatic tricuspid insufficiency: Secondary | ICD-10-CM

## 2022-04-15 DIAGNOSIS — Z01818 Encounter for other preprocedural examination: Secondary | ICD-10-CM | POA: Insufficient documentation

## 2022-04-15 DIAGNOSIS — B1920 Unspecified viral hepatitis C without hepatic coma: Secondary | ICD-10-CM | POA: Insufficient documentation

## 2022-04-15 DIAGNOSIS — G894 Chronic pain syndrome: Secondary | ICD-10-CM | POA: Insufficient documentation

## 2022-04-15 DIAGNOSIS — E1140 Type 2 diabetes mellitus with diabetic neuropathy, unspecified: Secondary | ICD-10-CM | POA: Insufficient documentation

## 2022-04-15 DIAGNOSIS — I10 Essential (primary) hypertension: Secondary | ICD-10-CM | POA: Insufficient documentation

## 2022-04-15 DIAGNOSIS — I081 Rheumatic disorders of both mitral and tricuspid valves: Secondary | ICD-10-CM | POA: Insufficient documentation

## 2022-04-15 DIAGNOSIS — M797 Fibromyalgia: Secondary | ICD-10-CM | POA: Insufficient documentation

## 2022-04-15 DIAGNOSIS — F1911 Other psychoactive substance abuse, in remission: Secondary | ICD-10-CM | POA: Diagnosis not present

## 2022-04-15 DIAGNOSIS — Z7984 Long term (current) use of oral hypoglycemic drugs: Secondary | ICD-10-CM | POA: Insufficient documentation

## 2022-04-15 DIAGNOSIS — E119 Type 2 diabetes mellitus without complications: Secondary | ICD-10-CM | POA: Insufficient documentation

## 2022-04-15 DIAGNOSIS — F17200 Nicotine dependence, unspecified, uncomplicated: Secondary | ICD-10-CM | POA: Insufficient documentation

## 2022-04-15 DIAGNOSIS — F17210 Nicotine dependence, cigarettes, uncomplicated: Secondary | ICD-10-CM | POA: Diagnosis not present

## 2022-04-15 DIAGNOSIS — Z8619 Personal history of other infectious and parasitic diseases: Secondary | ICD-10-CM | POA: Insufficient documentation

## 2022-04-15 DIAGNOSIS — R0609 Other forms of dyspnea: Secondary | ICD-10-CM | POA: Diagnosis not present

## 2022-04-15 DIAGNOSIS — F1910 Other psychoactive substance abuse, uncomplicated: Secondary | ICD-10-CM | POA: Insufficient documentation

## 2022-04-15 HISTORY — DX: Rheumatic tricuspid insufficiency: I07.1

## 2022-04-15 HISTORY — DX: Nonrheumatic mitral (valve) insufficiency: I34.0

## 2022-04-15 LAB — CBC
HCT: 33.3 % — ABNORMAL LOW (ref 36.0–46.0)
Hemoglobin: 10.8 g/dL — ABNORMAL LOW (ref 12.0–15.0)
MCH: 29 pg (ref 26.0–34.0)
MCHC: 32.4 g/dL (ref 30.0–36.0)
MCV: 89.3 fL (ref 80.0–100.0)
Platelets: 159 10*3/uL (ref 150–400)
RBC: 3.73 MIL/uL — ABNORMAL LOW (ref 3.87–5.11)
RDW: 15 % (ref 11.5–15.5)
WBC: 4.3 10*3/uL (ref 4.0–10.5)
nRBC: 0 % (ref 0.0–0.2)

## 2022-04-15 LAB — PULMONARY FUNCTION TEST
DL/VA % pred: 55 %
DL/VA: 2.21 ml/min/mmHg/L
DLCO unc % pred: 44 %
DLCO unc: 11.06 ml/min/mmHg
FEF 25-75 Post: 2.75 L/sec
FEF 25-75 Pre: 2.99 L/sec
FEF2575-%Change-Post: -8 %
FEF2575-%Pred-Post: 109 %
FEF2575-%Pred-Pre: 118 %
FEV1-%Change-Post: 0 %
FEV1-%Pred-Post: 104 %
FEV1-%Pred-Pre: 104 %
FEV1-Post: 2.81 L
FEV1-Pre: 2.83 L
FEV1FVC-%Change-Post: 3 %
FEV1FVC-%Pred-Pre: 103 %
FEV6-%Change-Post: -3 %
FEV6-%Pred-Post: 99 %
FEV6-%Pred-Pre: 103 %
FEV6-Post: 3.32 L
FEV6-Pre: 3.44 L
FEV6FVC-%Pred-Post: 102 %
FEV6FVC-%Pred-Pre: 102 %
FVC-%Change-Post: -3 %
FVC-%Pred-Post: 96 %
FVC-%Pred-Pre: 100 %
FVC-Post: 3.32 L
FVC-Pre: 3.44 L
Post FEV1/FVC ratio: 85 %
Post FEV6/FVC ratio: 100 %
Pre FEV1/FVC ratio: 82 %
Pre FEV6/FVC Ratio: 100 %
RV % pred: 95 %
RV: 2.27 L
TLC % pred: 96 %
TLC: 5.92 L

## 2022-04-15 LAB — COMPREHENSIVE METABOLIC PANEL
ALT: 14 U/L (ref 0–44)
AST: 22 U/L (ref 15–41)
Albumin: 4 g/dL (ref 3.5–5.0)
Alkaline Phosphatase: 60 U/L (ref 38–126)
Anion gap: 9 (ref 5–15)
BUN: 39 mg/dL — ABNORMAL HIGH (ref 8–23)
CO2: 21 mmol/L — ABNORMAL LOW (ref 22–32)
Calcium: 8.7 mg/dL — ABNORMAL LOW (ref 8.9–10.3)
Chloride: 112 mmol/L — ABNORMAL HIGH (ref 98–111)
Creatinine, Ser: 1.38 mg/dL — ABNORMAL HIGH (ref 0.44–1.00)
GFR, Estimated: 43 mL/min — ABNORMAL LOW (ref >60)
Glucose, Bld: 130 mg/dL — ABNORMAL HIGH (ref 70–99)
Potassium: 4.3 mmol/L (ref 3.5–5.1)
Sodium: 142 mmol/L (ref 135–145)
Total Bilirubin: 0.6 mg/dL (ref 0.3–1.2)
Total Protein: 7.1 g/dL (ref 6.5–8.1)

## 2022-04-15 LAB — BLOOD GAS, ARTERIAL
Acid-base deficit: 0.7 mmol/L (ref 0.0–2.0)
Bicarbonate: 24.2 mmol/L (ref 20.0–28.0)
Drawn by: 58793
O2 Saturation: 99.4 %
Patient temperature: 37
pCO2 arterial: 40 mmHg (ref 32–48)
pH, Arterial: 7.39 (ref 7.35–7.45)
pO2, Arterial: 102 mmHg (ref 83–108)

## 2022-04-15 LAB — SURGICAL PCR SCREEN
MRSA, PCR: NEGATIVE
Staphylococcus aureus: NEGATIVE

## 2022-04-15 LAB — URINALYSIS, ROUTINE W REFLEX MICROSCOPIC
Bilirubin Urine: NEGATIVE
Glucose, UA: NEGATIVE mg/dL
Hgb urine dipstick: NEGATIVE
Ketones, ur: NEGATIVE mg/dL
Leukocytes,Ua: NEGATIVE
Nitrite: NEGATIVE
Protein, ur: NEGATIVE mg/dL
Specific Gravity, Urine: 1.011 (ref 1.005–1.030)
pH: 5 (ref 5.0–8.0)

## 2022-04-15 LAB — HEMOGLOBIN A1C
Hgb A1c MFr Bld: 6.1 % — ABNORMAL HIGH (ref 4.8–5.6)
Mean Plasma Glucose: 128.37 mg/dL

## 2022-04-15 LAB — APTT: aPTT: 29 seconds (ref 24–36)

## 2022-04-15 LAB — GLUCOSE, CAPILLARY: Glucose-Capillary: 116 mg/dL — ABNORMAL HIGH (ref 70–99)

## 2022-04-15 LAB — PROTIME-INR
INR: 1 (ref 0.8–1.2)
Prothrombin Time: 12.9 seconds (ref 11.4–15.2)

## 2022-04-15 LAB — SARS CORONAVIRUS 2 (TAT 6-24 HRS): SARS Coronavirus 2: NEGATIVE

## 2022-04-15 MED ORDER — ALBUTEROL SULFATE (2.5 MG/3ML) 0.083% IN NEBU
2.5000 mg | INHALATION_SOLUTION | Freq: Once | RESPIRATORY_TRACT | Status: AC
Start: 2022-04-15 — End: 2022-04-15
  Administered 2022-04-15: 2.5 mg via RESPIRATORY_TRACT

## 2022-04-15 MED ORDER — CHLORHEXIDINE GLUCONATE 0.12 % MT SOLN
OROMUCOSAL | Status: AC
  Filled 2022-04-15: qty 15

## 2022-04-15 NOTE — Progress Notes (Signed)
VASCULAR LAB ? ? ? ?Pre CABG Dopplers have been performed. ? ?See CV proc for preliminary results. ? ? ?Ardon Franklin, RVT ?04/15/2022, 9:02 AM ? ? ?

## 2022-04-15 NOTE — Progress Notes (Signed)
PCP - Previously seen at [facility]. Will now be going to [facility] as of tomorrow 5/12. She has an appt at 0900. ?Cardiologist - Dr. Gasper Sells ? ?PPM/ICD - n/a ? ?Chest x-ray - 04/15/22 ?EKG - 04/15/22 ?Stress Test - 04/07/20 ?ECHO - 02/03/22 ?Cardiac Cath -02/12/22  ? ?Sleep Study - Pt stated she's had a negative sleep study in the past. ?CPAP - denies ? ?Pt does not check CBG at home nor does she have the materials to do so.  ? ?Blood Thinner Instructions: n/a ?Aspirin Instructions:Cont, none DOS. ? ?NPO at MD ? ?COVID TEST- 04/15/22, done in PAT. ? ? ?Anesthesia review: Yes ? ?Patient denies shortness of breath, fever, cough and chest pain at PAT appointment ? ? ?All instructions explained to the patient, with a verbal understanding of the material. Patient agrees to go over the instructions while at home for a better understanding. Patient also instructed to self quarantine after being tested for COVID-19. The opportunity to ask questions was provided. ? ? ?

## 2022-04-16 MED ORDER — PAPAVERINE HCL 30 MG/ML IJ SOLN
INTRAMUSCULAR | Status: DC
  Filled 2022-04-16: qty 2.5

## 2022-04-16 MED ORDER — HEPARIN 30,000 UNITS/1000 ML (OHS) CELLSAVER SOLUTION
Status: DC
  Filled 2022-04-16: qty 1000

## 2022-04-16 MED ORDER — MAGNESIUM SULFATE 50 % IJ SOLN
40.0000 meq | INTRAMUSCULAR | Status: DC
  Filled 2022-04-16: qty 9.85

## 2022-04-16 MED ORDER — NOREPINEPHRINE 4 MG/250ML-% IV SOLN
0.0000 ug/min | INTRAVENOUS | Status: DC
  Filled 2022-04-16: qty 250

## 2022-04-16 MED ORDER — INSULIN REGULAR(HUMAN) IN NACL 100-0.9 UT/100ML-% IV SOLN
INTRAVENOUS | Status: AC
  Administered 2022-04-19: 2.2 [IU]/h via INTRAVENOUS
  Filled 2022-04-16: qty 100

## 2022-04-16 MED ORDER — SODIUM CHLORIDE 0.9 % IV SOLN
1.5000 mg/kg/h | INTRAVENOUS | Status: AC
  Administered 2022-04-19: 1.5 mg/kg/h via INTRAVENOUS
  Filled 2022-04-16: qty 25

## 2022-04-16 MED ORDER — NITROGLYCERIN IN D5W 200-5 MCG/ML-% IV SOLN
2.0000 ug/min | INTRAVENOUS | Status: DC
  Filled 2022-04-16: qty 250

## 2022-04-16 MED ORDER — POTASSIUM CHLORIDE 2 MEQ/ML IV SOLN
80.0000 meq | INTRAVENOUS | Status: DC
  Filled 2022-04-16: qty 40

## 2022-04-16 MED ORDER — MILRINONE LACTATE IN DEXTROSE 20-5 MG/100ML-% IV SOLN
0.3000 ug/kg/min | INTRAVENOUS | Status: DC
  Filled 2022-04-16: qty 100

## 2022-04-16 MED ORDER — VANCOMYCIN HCL 1500 MG/300ML IV SOLN
1500.0000 mg | INTRAVENOUS | Status: AC
  Administered 2022-04-19: 1500 mg via INTRAVENOUS
  Filled 2022-04-16: qty 300

## 2022-04-16 MED ORDER — DEXMEDETOMIDINE HCL IN NACL 400 MCG/100ML IV SOLN
0.1000 ug/kg/h | INTRAVENOUS | Status: AC
  Administered 2022-04-19: .7 ug/kg/h via INTRAVENOUS
  Filled 2022-04-16: qty 100

## 2022-04-16 MED ORDER — EPINEPHRINE HCL 5 MG/250ML IV SOLN IN NS
0.0000 ug/min | INTRAVENOUS | Status: DC
  Filled 2022-04-16: qty 250

## 2022-04-16 MED ORDER — TRANEXAMIC ACID (OHS) PUMP PRIME SOLUTION
2.0000 mg/kg | INTRAVENOUS | Status: DC
Start: 2022-04-19 — End: 2022-04-19
  Filled 2022-04-16: qty 1.92

## 2022-04-16 MED ORDER — CEFAZOLIN SODIUM-DEXTROSE 2-4 GM/100ML-% IV SOLN
2.0000 g | INTRAVENOUS | Status: DC
  Filled 2022-04-16: qty 100

## 2022-04-16 MED ORDER — TRANEXAMIC ACID (OHS) BOLUS VIA INFUSION
15.0000 mg/kg | INTRAVENOUS | Status: AC
  Administered 2022-04-19: 1443 mg via INTRAVENOUS
  Filled 2022-04-16: qty 1443

## 2022-04-16 MED ORDER — CEFAZOLIN SODIUM-DEXTROSE 2-4 GM/100ML-% IV SOLN
2.0000 g | INTRAVENOUS | Status: AC
  Administered 2022-04-19 (×2): 2 g via INTRAVENOUS
  Filled 2022-04-16: qty 100

## 2022-04-16 MED ORDER — PHENYLEPHRINE HCL-NACL 20-0.9 MG/250ML-% IV SOLN
30.0000 ug/min | INTRAVENOUS | Status: AC
  Administered 2022-04-19: 50 ug/min via INTRAVENOUS
  Filled 2022-04-16: qty 250

## 2022-04-16 NOTE — Progress Notes (Signed)
Anesthesia Chart Review: ? Case: [id number] Date/Time: 04/19/22 0717  ? Procedures:  ?    MITRAL VALVE REPAIR OR REPLACEMENT (MVR) ?    TRICUSPID VALVE REPAIR ?    TRANSESOPHAGEAL ECHOCARDIOGRAM (TEE)  ? Anesthesia type: General  ? Pre-op diagnosis:  ?    MR ?    TR  ? Location: [facility] ROOM 15 / [facility]  ? Surgeons: Melrose Nakayama, MD  ? ?  ? ? ?DISCUSSION: Patient is a 61 year old female scheduled for the above procedure. ? ?History includes smoking, DM2 (with neuropathy), HTN, Hepatitis C (s/p therapy), polysubstance abuse (sober since 2001), chronic neck pain, fibromyalgia, mitral and tricuspid valve regurgitation. Cardiology notes also mention PAF possibly in 2018. ? ?Anesthesia team to evaluate on the day of surgery.  04/15/2022 COVID-19 test was negative. ? ? ?VS: BP (!) 148/93   Pulse 73   Temp 36.8 ?C (Oral)   Resp 17   Ht '5\' 11"'$  (1.803 m)   Wt 96.2 kg   LMP 12/06/1976   SpO2 100%   BMI 29.57 kg/m?  ? ? ?PROVIDERS: ?[facility] has been primary care, but will be switching to [facility] as of 04/16/22. ?- Rudean Haskell, MD is cardiologist ? ? ?LABS: Labs reviewed: Acceptable for surgery. Renal function and H/H appear stable.  ?(all labs ordered are listed, but only abnormal results are displayed) ? ?Labs Reviewed  ?GLUCOSE, CAPILLARY - Abnormal; Notable for the following components:  ?    Result Value  ? Glucose-Capillary 116 (*)   ? All other components within normal limits  ?CBC - Abnormal; Notable for the following components:  ? RBC 3.73 (*)   ? Hemoglobin 10.8 (*)   ? HCT 33.3 (*)   ? All other components within normal limits  ?COMPREHENSIVE METABOLIC PANEL - Abnormal; Notable for the following components:  ? Chloride 112 (*)   ? CO2 21 (*)   ? Glucose, Bld 130 (*)   ? BUN 39 (*)   ? Creatinine, Ser 1.38 (*)   ? Calcium 8.7 (*)   ? GFR, Estimated 43 (*)   ? All other components within normal limits  ?URINALYSIS, ROUTINE W REFLEX MICROSCOPIC - Abnormal; Notable for the  following components:  ? Color, Urine STRAW (*)   ? All other components within normal limits  ?HEMOGLOBIN A1C - Abnormal; Notable for the following components:  ? Hgb A1c MFr Bld 6.1 (*)   ? All other components within normal limits  ?SARS CORONAVIRUS 2 (TAT 6-24 HRS)  ?SURGICAL PCR SCREEN  ?PROTIME-INR  ?APTT  ?BLOOD GAS, ARTERIAL  ?TYPE AND SCREEN  ? ? ?PFTs 04/15/22: FVC 3.44 (100%), post 3.32 (96%). FEV1 2.83 (104%), post 2.81 (104%). DLCO unc 11.06 (44%). ? ? ?IMAGES: ?CXR 04/15/22: ?FINDINGS: ?- Cardiomediastinal silhouette unchanged in size and contour. No ?evidence of central vascular congestion. No interlobular septal ?thickening. ?- No pneumothorax or pleural effusion. Coarsened interstitial ?markings, with no confluent airspace disease. ?- No acute displaced fracture. Degenerative changes of the spine. ?IMPRESSION: ?No active cardiopulmonary disease. ? ? ?EKG: 04/15/22: ?Normal sinus rhythm ?Cannot rule out Anterior infarct , age undetermined ?Abnormal ECG ?Confirmed by Eleonore Chiquito [phone]) on 04/15/2022 9:32:11 PM ? ? ?CV: ?US Carotid 04/15/22: ?Summary:  ?Right Carotid: The extracranial vessels were near-normal with only minimal  ?wall  ?               thickening or plaque. Systolic and Diastolic flow is  ?elevated in  ?  the distal ICA, etiology unknown.  ? ?Left Carotid: The extracranial vessels were near-normal with only minimal  ?wall  ?              thickening or plaque. Systolic and Diastolic flow is  ?elevated in  ?              the distal ICA, etiology unknown.  ?Vertebrals:  Bilateral vertebral arteries demonstrate antegrade flow.  ?Subclavians: Normal flow hemodynamics were seen in bilateral subclavian  ?             arteries.  ? ? ?RHC/LHC 02/12/22: ?CONCLUSIONS: ?Mild pulmonary hypertension with mean PA pressure 31 mmHg.  Mean pulmonary wedge pressure 23 mmHg with a V wave 41 mmHg.  WHO group 2 pulmonary hypertension. ?Pulmonary vascular resistance 0.99 Woods units ?Left  ventricular end diastolic pressure 35 mmHg, consistent with diastolic heart failure. ?Right dominant coronary anatomy.  Widely patent coronaries without obstructive disease. ?Previously documented severe mitral regurgitation. ?  ? ?TEE 02/03/22: ?IMPRESSIONS  ? 1. 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  ? 2. Left ventricular ejection fraction, by estimation, is 60 to 65%. The  ?left ventricle has normal function. The left ventricle has no regional  ?wall motion abnormalities.  ? 3. Right ventricular systolic function is normal. The right ventricular  ?size is normal.  ? 4. Left atrial size was mildly dilated. No left atrial/left atrial  ?appendage thrombus was detected.  ? 5. Thickened leaflets ? rheumatic severe MR 3D images suggest ERO .4 cm2  ?with largest area of jet between P1/A1. Blunted systolic flow in PVls PISA  ?1 cm2 . The mitral valve is rheumatic. Severe mitral valve regurgitation.  ?No evidence of mitral stenosis.  ? 6. Thickened leaflets ? rheumatic . The tricuspid valve is abnormal.  ?Tricuspid valve regurgitation is severe.  ? 7. The aortic valve is normal in structure. Aortic valve regurgitation is  ?not visualized. No aortic stenosis is present.  ? 8. The inferior vena cava is normal in size with greater than 50%  ?respiratory variability, suggesting right atrial pressure of 3 mmHg.  ?- Conclusion(s)/Recommendation(s): Normal biventricular function  ? ? ?Long term event ZioXT monitor 01/01/22-01/15/22: ?Patient had a minimum heart rate of 45 bpm, maximum heart rate of 171 bpm (SVT), and average heart rate of 71 bpm. ?Predominant underlying rhythm was sinus rhythm. ?Three runs of NSVT occurred lasting 6 beats at longest with a max rate of 169 bpm at fastest. ?Many runs of SVT occurred lasting 21 seconds at longest with a max rate of 171 bpm at fastest. ?Isolated PACs were rare (<1.0%). ?Isolated PVCs were occasional ( 2.7%). ?Triggered and diary events  associated with sinus rhythm and PVCs. ?  ?Past Medical History:  ?Diagnosis Date  ? Arthritis   ? Cervicalgia   ? Chondromalacia of patella   ? Chronic neck pain   ? Chronic pain syndrome   ? Depression   ? secondary to loss of her son at age 26  ? Diabetes mellitus   ? Diabetic neuropathy ([place])   ? DMII (diabetes mellitus, type 2) ([place])   ? Enthesopathy of hip region   ? Fibromyalgia   ? Hep C w/o coma, chronic (HCC)   ? Hepatitis C   ? diagnosed 2005  ? Hypertension   ? Insomnia   ? Insomnia   ? Low back pain   ? Lumbago   ? Mitral regurgitation   ?  Obesity   ? Pain in joint, upper arm   ? Primary localized osteoarthrosis, lower leg   ? Substance abuse ([place])   ? sober since 2001  ? Tobacco abuse   ? Tricuspid regurgitation   ? 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 ? ?Past Surgical History:  ?Procedure Laterality Date  ? ABDOMINAL HYSTERECTOMY    ? at age 51, unknown reasons  ? CARPAL TUNNEL RELEASE  12/07/2011  ? left side  ? EYE SURGERY    ? FOOT SURGERY Bilateral   ? RIGHT/LEFT HEART CATH AND CORONARY ANGIOGRAPHY N/A 02/12/2022  ? Procedure: RIGHT/LEFT HEART CATH AND CORONARY ANGIOGRAPHY;  Surgeon: Belva Crome, MD;  Location: [facility];  Service: Cardiovascular;  Laterality: N/A;  ? TEE WITHOUT CARDIOVERSION N/A 02/03/2022  ? Procedure: TRANSESOPHAGEAL ECHOCARDIOGRAM (TEE);  Surgeon: Josue Hector, MD;  Location: [facility] ENDOSCOPY;  Service: Cardiovascular;  Laterality: N/A;  ? ? ?MEDICATIONS: ? ACCU-CHEK FASTCLIX LANCETS MISC  ? albuterol (PROVENTIL HFA;VENTOLIN HFA) 108 (90 Base) MCG/ACT inhaler  ? aspirin 81 MG EC tablet  ? atorvastatin (LIPITOR) 20 MG tablet  ? baclofen (LIORESAL) 10 MG tablet  ? BIOTIN PO  ? buPROPion (WELLBUTRIN XL) 300 MG 24 hr tablet  ? Camphor-Menthol-Methyl Sal (SALONPAS) 3.12-11-08 % PTCH  ? Cyanocobalamin (VITAMIN B 12 PO)  ? dexlansoprazole (DEXILANT) 60 MG capsule  ? diclofenac Sodium (VOLTAREN) 1 % GEL  ? furosemide (LASIX) 40 MG tablet  ? gabapentin (NEURONTIN)  600 MG tablet  ? glucose blood (ACCU-CHEK SMARTVIEW) test strip  ? hydrOXYzine (ATARAX/VISTARIL) 25 MG tablet  ? Lifitegrast (XIIDRA) 5 % SOLN  ? linagliptin (TRADJENTA) 5 MG TABS tablet  ? losartan (COZAAR) 100 MG tablet

## 2022-04-16 NOTE — Anesthesia Preprocedure Evaluation (Addendum)
Anesthesia Evaluation  ?Patient identified by MRN, date of birth, ID band ?Patient awake ? ? ? ?Reviewed: ?Allergy & Precautions, NPO status , Patient's Chart, lab work & pertinent test results ? ?History of Anesthesia Complications ?Negative for: history of anesthetic complications ? ?Airway ?Mallampati: I ? ?TM Distance: >3 FB ?Neck ROM: Limited ? ? ? Dental ? ?(+) Dental Advisory Given ?  ?Pulmonary ?Current Smoker and Patient abstained from smoking.,  ?  ?Pulmonary exam normal ? ? ? ? ? ? ? Cardiovascular ?hypertension, Pt. on home beta blockers and Pt. on medications ?+ dysrhythmias Atrial Fibrillation + Valvular Problems/Murmurs  ?Rhythm:Regular Rate:Bradycardia ?+ Systolic murmurs ? ?'23 Cath -Mild pulmonary hypertension with mean PA pressure 31 mmHg.  Mean pulmonary wedge pressure 23 mmHg with a V wave 41 mmHg.  WHO group 2 pulmonary hypertension. ?Pulmonary vascular resistance 0.99 Woods units ?Left ventricular end diastolic pressure 35 mmHg, consistent with diastolic heart failure. ?Right dominant coronary anatomy.  Widely patent coronaries without obstructive disease. ?Previously documented severe mitral regurgitation. ? ?'23 TEE - EF 60 to 65%. Left atrial size was mildly dilated. Thickened MV leaflets ? rheumatic severe MR 3D images suggest ERO .4 cm2 with largest area of jet between P1/A1. Blunted systolic flow in PVls PISA 1 cm2 . The mitral valve is rheumatic. Severe mitral valve regurgitation. Thickened TV leaflets ? Rheumatic. Tricuspid valve regurgitation is severe.   ? ?  ?Neuro/Psych ?PSYCHIATRIC DISORDERS Anxiety Depression  Neuromuscular disease   ? GI/Hepatic ?GERD  Medicated and Controlled,(+)  ?  ? substance abuse (sober x 22 yrs) ? , Hepatitis -, C  ?Endo/Other  ?diabetes, Type 2, Oral Hypoglycemic Agents ? Renal/GU ?negative Renal ROS  ? ?  ?Musculoskeletal ? ?(+) Arthritis , Osteoarthritis,  Fibromyalgia - ? Abdominal ?  ?Peds ? Hematology ? ?(+) Blood  dyscrasia, anemia ,   ?Anesthesia Other Findings ? ? Reproductive/Obstetrics ? ?  ? ? ? ? ? ? ? ? ? ? ? ? ? ?  ?  ? ? ? ? ? ? ?Anesthesia Physical ?Anesthesia Plan ? ?ASA: 4 ? ?Anesthesia Plan: General  ? ?Post-op Pain Management:   ? ?Induction: Intravenous ? ?PONV Risk Score and Plan: 2 and Treatment may vary due to age or medical condition ? ?Airway Management Planned: Oral ETT ? ?Additional Equipment: Arterial line, CVP, PA Cath, TEE and Ultrasound Guidance Line Placement ? ?Intra-op Plan:  ? ?Post-operative Plan: Post-operative intubation/ventilation ? ?Informed Consent: I have reviewed the patients History and Physical, chart, labs and discussed the procedure including the risks, benefits and alternatives for the proposed anesthesia with the patient or authorized representative who has indicated his/her understanding and acceptance.  ? ? ? ?Dental advisory given ? ?Plan Discussed with: CRNA and Anesthesiologist ? ?Anesthesia Plan Comments:   ? ? ? ? ?Anesthesia Quick Evaluation ? ?

## 2022-04-19 ENCOUNTER — Encounter (HOSPITAL_COMMUNITY): Payer: Self-pay | Admitting: Thoracic Surgery (Cardiothoracic Vascular Surgery)

## 2022-04-19 ENCOUNTER — Inpatient Hospital Stay (HOSPITAL_COMMUNITY): Payer: Medicare Other

## 2022-04-19 ENCOUNTER — Inpatient Hospital Stay (HOSPITAL_COMMUNITY): Payer: Medicare Other | Admitting: Vascular Surgery

## 2022-04-19 ENCOUNTER — Inpatient Hospital Stay (HOSPITAL_COMMUNITY): Payer: Medicare Other | Admitting: Certified Registered"

## 2022-04-19 ENCOUNTER — Encounter (HOSPITAL_COMMUNITY)
Admission: RE | Disposition: A | Discharge status: Skilled Nursing Facility | Payer: Self-pay | Source: Home / Self Care | Attending: Thoracic Surgery (Cardiothoracic Vascular Surgery)

## 2022-04-19 ENCOUNTER — Inpatient Hospital Stay (HOSPITAL_COMMUNITY)
Admission: RE | Admit: 2022-04-19 | Discharge: 2022-04-28 | DRG: 220 | Disposition: A | Discharge status: Skilled Nursing Facility | Payer: Medicare Other | Attending: Thoracic Surgery (Cardiothoracic Vascular Surgery) | Admitting: Thoracic Surgery (Cardiothoracic Vascular Surgery)

## 2022-04-19 ENCOUNTER — Other Ambulatory Visit: Payer: Self-pay

## 2022-04-19 DIAGNOSIS — Z90710 Acquired absence of both cervix and uterus: Secondary | ICD-10-CM

## 2022-04-19 DIAGNOSIS — Z811 Family history of alcohol abuse and dependence: Secondary | ICD-10-CM

## 2022-04-19 DIAGNOSIS — J9811 Atelectasis: Secondary | ICD-10-CM | POA: Diagnosis not present

## 2022-04-19 DIAGNOSIS — Z79899 Other long term (current) drug therapy: Secondary | ICD-10-CM

## 2022-04-19 DIAGNOSIS — Z8249 Family history of ischemic heart disease and other diseases of the circulatory system: Secondary | ICD-10-CM | POA: Diagnosis not present

## 2022-04-19 DIAGNOSIS — Z8619 Personal history of other infectious and parasitic diseases: Secondary | ICD-10-CM | POA: Diagnosis not present

## 2022-04-19 DIAGNOSIS — E119 Type 2 diabetes mellitus without complications: Secondary | ICD-10-CM | POA: Diagnosis not present

## 2022-04-19 DIAGNOSIS — I480 Paroxysmal atrial fibrillation: Secondary | ICD-10-CM | POA: Diagnosis not present

## 2022-04-19 DIAGNOSIS — E1140 Type 2 diabetes mellitus with diabetic neuropathy, unspecified: Secondary | ICD-10-CM | POA: Diagnosis present

## 2022-04-19 DIAGNOSIS — Z8049 Family history of malignant neoplasm of other genital organs: Secondary | ICD-10-CM | POA: Diagnosis not present

## 2022-04-19 DIAGNOSIS — M1990 Unspecified osteoarthritis, unspecified site: Secondary | ICD-10-CM | POA: Diagnosis present

## 2022-04-19 DIAGNOSIS — M797 Fibromyalgia: Secondary | ICD-10-CM | POA: Diagnosis present

## 2022-04-19 DIAGNOSIS — Z7984 Long term (current) use of oral hypoglycemic drugs: Secondary | ICD-10-CM

## 2022-04-19 DIAGNOSIS — E872 Acidosis, unspecified: Secondary | ICD-10-CM | POA: Diagnosis not present

## 2022-04-19 DIAGNOSIS — D696 Thrombocytopenia, unspecified: Secondary | ICD-10-CM | POA: Diagnosis not present

## 2022-04-19 DIAGNOSIS — I10 Essential (primary) hypertension: Secondary | ICD-10-CM

## 2022-04-19 DIAGNOSIS — D62 Acute posthemorrhagic anemia: Secondary | ICD-10-CM | POA: Diagnosis not present

## 2022-04-19 DIAGNOSIS — E669 Obesity, unspecified: Secondary | ICD-10-CM | POA: Diagnosis present

## 2022-04-19 DIAGNOSIS — I081 Rheumatic disorders of both mitral and tricuspid valves: Secondary | ICD-10-CM

## 2022-04-19 DIAGNOSIS — I5032 Chronic diastolic (congestive) heart failure: Secondary | ICD-10-CM | POA: Diagnosis present

## 2022-04-19 DIAGNOSIS — E871 Hypo-osmolality and hyponatremia: Secondary | ICD-10-CM | POA: Diagnosis not present

## 2022-04-19 DIAGNOSIS — I110 Hypertensive heart disease with heart failure: Secondary | ICD-10-CM | POA: Diagnosis present

## 2022-04-19 DIAGNOSIS — I361 Nonrheumatic tricuspid (valve) insufficiency: Secondary | ICD-10-CM | POA: Diagnosis not present

## 2022-04-19 DIAGNOSIS — Z833 Family history of diabetes mellitus: Secondary | ICD-10-CM

## 2022-04-19 DIAGNOSIS — Z813 Family history of other psychoactive substance abuse and dependence: Secondary | ICD-10-CM

## 2022-04-19 DIAGNOSIS — G894 Chronic pain syndrome: Secondary | ICD-10-CM | POA: Diagnosis present

## 2022-04-19 DIAGNOSIS — E1159 Type 2 diabetes mellitus with other circulatory complications: Secondary | ICD-10-CM

## 2022-04-19 DIAGNOSIS — R001 Bradycardia, unspecified: Secondary | ICD-10-CM | POA: Diagnosis not present

## 2022-04-19 DIAGNOSIS — I4892 Unspecified atrial flutter: Secondary | ICD-10-CM | POA: Diagnosis not present

## 2022-04-19 DIAGNOSIS — F32A Depression, unspecified: Secondary | ICD-10-CM | POA: Diagnosis present

## 2022-04-19 DIAGNOSIS — F17210 Nicotine dependence, cigarettes, uncomplicated: Secondary | ICD-10-CM | POA: Diagnosis present

## 2022-04-19 DIAGNOSIS — Z7982 Long term (current) use of aspirin: Secondary | ICD-10-CM

## 2022-04-19 DIAGNOSIS — I2722 Pulmonary hypertension due to left heart disease: Secondary | ICD-10-CM | POA: Diagnosis present

## 2022-04-19 DIAGNOSIS — I493 Ventricular premature depolarization: Secondary | ICD-10-CM | POA: Diagnosis not present

## 2022-04-19 DIAGNOSIS — Z885 Allergy status to narcotic agent status: Secondary | ICD-10-CM | POA: Diagnosis not present

## 2022-04-19 DIAGNOSIS — I071 Rheumatic tricuspid insufficiency: Principal | ICD-10-CM

## 2022-04-19 DIAGNOSIS — M545 Low back pain, unspecified: Secondary | ICD-10-CM | POA: Diagnosis present

## 2022-04-19 DIAGNOSIS — Z6830 Body mass index (BMI) 30.0-30.9, adult: Secondary | ICD-10-CM

## 2022-04-19 DIAGNOSIS — I340 Nonrheumatic mitral (valve) insufficiency: Secondary | ICD-10-CM | POA: Diagnosis not present

## 2022-04-19 DIAGNOSIS — Z818 Family history of other mental and behavioral disorders: Secondary | ICD-10-CM

## 2022-04-19 HISTORY — PX: MITRAL VALVE REPAIR: SHX2039

## 2022-04-19 HISTORY — PX: TEE WITHOUT CARDIOVERSION: SHX5443

## 2022-04-19 HISTORY — PX: TRICUSPID VALVE REPLACEMENT: SHX816

## 2022-04-19 LAB — PREPARE RBC (CROSSMATCH)

## 2022-04-19 LAB — POCT I-STAT 7, (LYTES, BLD GAS, ICA,H+H)
Acid-Base Excess: 0 mmol/L (ref 0.0–2.0)
Acid-base deficit: 1 mmol/L (ref 0.0–2.0)
Acid-base deficit: 2 mmol/L (ref 0.0–2.0)
Acid-base deficit: 3 mmol/L — ABNORMAL HIGH (ref 0.0–2.0)
Acid-base deficit: 4 mmol/L — ABNORMAL HIGH (ref 0.0–2.0)
Acid-base deficit: 5 mmol/L — ABNORMAL HIGH (ref 0.0–2.0)
Acid-base deficit: 4 mmol/L — ABNORMAL HIGH (ref 0.0–2.0)
Acid-base deficit: 5 mmol/L — ABNORMAL HIGH (ref 0.0–2.0)
Bicarbonate: 24.7 mmol/L (ref 20.0–28.0)
Bicarbonate: 25.2 mmol/L (ref 20.0–28.0)
Bicarbonate: 22.4 mmol/L (ref 20.0–28.0)
Bicarbonate: 24.2 mmol/L (ref 20.0–28.0)
Bicarbonate: 21.7 mmol/L (ref 20.0–28.0)
Bicarbonate: 21 mmol/L (ref 20.0–28.0)
Bicarbonate: 21.4 mmol/L (ref 20.0–28.0)
Bicarbonate: 20.9 mmol/L (ref 20.0–28.0)
Calcium, Ion: 1.21 mmol/L (ref 1.15–1.40)
Calcium, Ion: 1.01 mmol/L — ABNORMAL LOW (ref 1.15–1.40)
Calcium, Ion: 1.02 mmol/L — ABNORMAL LOW (ref 1.15–1.40)
Calcium, Ion: 1.03 mmol/L — ABNORMAL LOW (ref 1.15–1.40)
Calcium, Ion: 1.08 mmol/L — ABNORMAL LOW (ref 1.15–1.40)
Calcium, Ion: 1.24 mmol/L (ref 1.15–1.40)
Calcium, Ion: 1.22 mmol/L (ref 1.15–1.40)
Calcium, Ion: 1.23 mmol/L (ref 1.15–1.40)
HCT: 29 % — ABNORMAL LOW (ref 36.0–46.0)
HCT: 21 % — ABNORMAL LOW (ref 36.0–46.0)
HCT: 21 % — ABNORMAL LOW (ref 36.0–46.0)
HCT: 21 % — ABNORMAL LOW (ref 36.0–46.0)
HCT: 28 % — ABNORMAL LOW (ref 36.0–46.0)
HCT: 25 % — ABNORMAL LOW (ref 36.0–46.0)
HCT: 25 % — ABNORMAL LOW (ref 36.0–46.0)
HCT: 25 % — ABNORMAL LOW (ref 36.0–46.0)
Hemoglobin: 9.9 g/dL — ABNORMAL LOW (ref 12.0–15.0)
Hemoglobin: 7.1 g/dL — ABNORMAL LOW (ref 12.0–15.0)
Hemoglobin: 7.1 g/dL — ABNORMAL LOW (ref 12.0–15.0)
Hemoglobin: 7.1 g/dL — ABNORMAL LOW (ref 12.0–15.0)
Hemoglobin: 9.5 g/dL — ABNORMAL LOW (ref 12.0–15.0)
Hemoglobin: 8.5 g/dL — ABNORMAL LOW (ref 12.0–15.0)
Hemoglobin: 8.5 g/dL — ABNORMAL LOW (ref 12.0–15.0)
Hemoglobin: 8.5 g/dL — ABNORMAL LOW (ref 12.0–15.0)
O2 Saturation: 100 %
O2 Saturation: 100 %
O2 Saturation: 100 %
O2 Saturation: 100 %
O2 Saturation: 99 %
O2 Saturation: 99 %
O2 Saturation: 99 %
O2 Saturation: 99 %
Patient temperature: 35.9
Patient temperature: 37.3
Patient temperature: 37.2
Patient temperature: 37.1
Potassium: 4.2 mmol/L (ref 3.5–5.1)
Potassium: 4 mmol/L (ref 3.5–5.1)
Potassium: 5.3 mmol/L — ABNORMAL HIGH (ref 3.5–5.1)
Potassium: 6.1 mmol/L — ABNORMAL HIGH (ref 3.5–5.1)
Potassium: 4.9 mmol/L (ref 3.5–5.1)
Potassium: 4.7 mmol/L (ref 3.5–5.1)
Potassium: 4.7 mmol/L (ref 3.5–5.1)
Potassium: 4.5 mmol/L (ref 3.5–5.1)
Sodium: 140 mmol/L (ref 135–145)
Sodium: 138 mmol/L (ref 135–145)
Sodium: 138 mmol/L (ref 135–145)
Sodium: 138 mmol/L (ref 135–145)
Sodium: 142 mmol/L (ref 135–145)
Sodium: 142 mmol/L (ref 135–145)
Sodium: 141 mmol/L (ref 135–145)
Sodium: 142 mmol/L (ref 135–145)
TCO2: 26 mmol/L (ref 22–32)
TCO2: 27 mmol/L (ref 22–32)
TCO2: 24 mmol/L (ref 22–32)
TCO2: 26 mmol/L (ref 22–32)
TCO2: 23 mmol/L (ref 22–32)
TCO2: 22 mmol/L (ref 22–32)
TCO2: 23 mmol/L (ref 22–32)
TCO2: 22 mmol/L (ref 22–32)
pCO2 arterial: 42.7 mmHg (ref 32–48)
pCO2 arterial: 43.2 mmHg (ref 32–48)
pCO2 arterial: 38.5 mmHg (ref 32–48)
pCO2 arterial: 46.2 mmHg (ref 32–48)
pCO2 arterial: 37.2 mmHg (ref 32–48)
pCO2 arterial: 41.4 mmHg (ref 32–48)
pCO2 arterial: 40.2 mmHg (ref 32–48)
pCO2 arterial: 40.7 mmHg (ref 32–48)
pH, Arterial: 7.371 (ref 7.35–7.45)
pH, Arterial: 7.374 (ref 7.35–7.45)
pH, Arterial: 7.327 — ABNORMAL LOW (ref 7.35–7.45)
pH, Arterial: 7.374 (ref 7.35–7.45)
pH, Arterial: 7.367 (ref 7.35–7.45)
pH, Arterial: 7.315 — ABNORMAL LOW (ref 7.35–7.45)
pH, Arterial: 7.335 — ABNORMAL LOW (ref 7.35–7.45)
pH, Arterial: 7.319 — ABNORMAL LOW (ref 7.35–7.45)
pO2, Arterial: 263 mmHg — ABNORMAL HIGH (ref 83–108)
pO2, Arterial: 405 mmHg — ABNORMAL HIGH (ref 83–108)
pO2, Arterial: 244 mmHg — ABNORMAL HIGH (ref 83–108)
pO2, Arterial: 308 mmHg — ABNORMAL HIGH (ref 83–108)
pO2, Arterial: 152 mmHg — ABNORMAL HIGH (ref 83–108)
pO2, Arterial: 161 mmHg — ABNORMAL HIGH (ref 83–108)
pO2, Arterial: 172 mmHg — ABNORMAL HIGH (ref 83–108)
pO2, Arterial: 152 mmHg — ABNORMAL HIGH (ref 83–108)

## 2022-04-19 LAB — CBC
HCT: 29.9 % — ABNORMAL LOW (ref 36.0–46.0)
HCT: 25.8 % — ABNORMAL LOW (ref 36.0–46.0)
Hemoglobin: 9.6 g/dL — ABNORMAL LOW (ref 12.0–15.0)
Hemoglobin: 8.4 g/dL — ABNORMAL LOW (ref 12.0–15.0)
MCH: 27.8 pg (ref 26.0–34.0)
MCH: 28.4 pg (ref 26.0–34.0)
MCHC: 32.1 g/dL (ref 30.0–36.0)
MCHC: 32.6 g/dL (ref 30.0–36.0)
MCV: 86.7 fL (ref 80.0–100.0)
MCV: 87.2 fL (ref 80.0–100.0)
Platelets: 156 10*3/uL (ref 150–400)
Platelets: 125 10*3/uL — ABNORMAL LOW (ref 150–400)
RBC: 3.45 MIL/uL — ABNORMAL LOW (ref 3.87–5.11)
RBC: 2.96 MIL/uL — ABNORMAL LOW (ref 3.87–5.11)
RDW: 15.3 % (ref 11.5–15.5)
RDW: 16 % — ABNORMAL HIGH (ref 11.5–15.5)
WBC: 7.3 10*3/uL (ref 4.0–10.5)
WBC: 5 10*3/uL (ref 4.0–10.5)
nRBC: 0 % (ref 0.0–0.2)
nRBC: 0 % (ref 0.0–0.2)

## 2022-04-19 LAB — GLUCOSE, CAPILLARY
Glucose-Capillary: 119 mg/dL — ABNORMAL HIGH (ref 70–99)
Glucose-Capillary: 96 mg/dL (ref 70–99)
Glucose-Capillary: 128 mg/dL — ABNORMAL HIGH (ref 70–99)
Glucose-Capillary: 108 mg/dL — ABNORMAL HIGH (ref 70–99)
Glucose-Capillary: 110 mg/dL — ABNORMAL HIGH (ref 70–99)
Glucose-Capillary: 119 mg/dL — ABNORMAL HIGH (ref 70–99)
Glucose-Capillary: 103 mg/dL — ABNORMAL HIGH (ref 70–99)
Glucose-Capillary: 115 mg/dL — ABNORMAL HIGH (ref 70–99)
Glucose-Capillary: 159 mg/dL — ABNORMAL HIGH (ref 70–99)
Glucose-Capillary: 146 mg/dL — ABNORMAL HIGH (ref 70–99)

## 2022-04-19 LAB — POCT I-STAT, CHEM 8
BUN: 17 mg/dL (ref 8–23)
BUN: 16 mg/dL (ref 8–23)
BUN: 16 mg/dL (ref 8–23)
BUN: 15 mg/dL (ref 8–23)
BUN: 14 mg/dL (ref 8–23)
BUN: 13 mg/dL (ref 8–23)
Calcium, Ion: 1.22 mmol/L (ref 1.15–1.40)
Calcium, Ion: 1.16 mmol/L (ref 1.15–1.40)
Calcium, Ion: 1 mmol/L — ABNORMAL LOW (ref 1.15–1.40)
Calcium, Ion: 1.02 mmol/L — ABNORMAL LOW (ref 1.15–1.40)
Calcium, Ion: 1.01 mmol/L — ABNORMAL LOW (ref 1.15–1.40)
Calcium, Ion: 1.06 mmol/L — ABNORMAL LOW (ref 1.15–1.40)
Chloride: 107 mmol/L (ref 98–111)
Chloride: 107 mmol/L (ref 98–111)
Chloride: 106 mmol/L (ref 98–111)
Chloride: 106 mmol/L (ref 98–111)
Chloride: 106 mmol/L (ref 98–111)
Chloride: 107 mmol/L (ref 98–111)
Creatinine, Ser: 1 mg/dL (ref 0.44–1.00)
Creatinine, Ser: 1 mg/dL (ref 0.44–1.00)
Creatinine, Ser: 0.9 mg/dL (ref 0.44–1.00)
Creatinine, Ser: 0.8 mg/dL (ref 0.44–1.00)
Creatinine, Ser: 0.8 mg/dL (ref 0.44–1.00)
Creatinine, Ser: 0.9 mg/dL (ref 0.44–1.00)
Glucose, Bld: 128 mg/dL — ABNORMAL HIGH (ref 70–99)
Glucose, Bld: 153 mg/dL — ABNORMAL HIGH (ref 70–99)
Glucose, Bld: 130 mg/dL — ABNORMAL HIGH (ref 70–99)
Glucose, Bld: 123 mg/dL — ABNORMAL HIGH (ref 70–99)
Glucose, Bld: 211 mg/dL — ABNORMAL HIGH (ref 70–99)
Glucose, Bld: 112 mg/dL — ABNORMAL HIGH (ref 70–99)
HCT: 33 % — ABNORMAL LOW (ref 36.0–46.0)
HCT: 27 % — ABNORMAL LOW (ref 36.0–46.0)
HCT: 23 % — ABNORMAL LOW (ref 36.0–46.0)
HCT: 23 % — ABNORMAL LOW (ref 36.0–46.0)
HCT: 22 % — ABNORMAL LOW (ref 36.0–46.0)
HCT: 27 % — ABNORMAL LOW (ref 36.0–46.0)
Hemoglobin: 11.2 g/dL — ABNORMAL LOW (ref 12.0–15.0)
Hemoglobin: 9.2 g/dL — ABNORMAL LOW (ref 12.0–15.0)
Hemoglobin: 7.8 g/dL — ABNORMAL LOW (ref 12.0–15.0)
Hemoglobin: 7.8 g/dL — ABNORMAL LOW (ref 12.0–15.0)
Hemoglobin: 7.5 g/dL — ABNORMAL LOW (ref 12.0–15.0)
Hemoglobin: 9.2 g/dL — ABNORMAL LOW (ref 12.0–15.0)
Potassium: 4.2 mmol/L (ref 3.5–5.1)
Potassium: 3.9 mmol/L (ref 3.5–5.1)
Potassium: 4.7 mmol/L (ref 3.5–5.1)
Potassium: 4.6 mmol/L (ref 3.5–5.1)
Potassium: 5.3 mmol/L — ABNORMAL HIGH (ref 3.5–5.1)
Potassium: 4.5 mmol/L (ref 3.5–5.1)
Sodium: 141 mmol/L (ref 135–145)
Sodium: 139 mmol/L (ref 135–145)
Sodium: 137 mmol/L (ref 135–145)
Sodium: 139 mmol/L (ref 135–145)
Sodium: 137 mmol/L (ref 135–145)
Sodium: 142 mmol/L (ref 135–145)
TCO2: 27 mmol/L (ref 22–32)
TCO2: 24 mmol/L (ref 22–32)
TCO2: 27 mmol/L (ref 22–32)
TCO2: 27 mmol/L (ref 22–32)
TCO2: 23 mmol/L (ref 22–32)
TCO2: 22 mmol/L (ref 22–32)

## 2022-04-19 LAB — HEMOGLOBIN AND HEMATOCRIT, BLOOD
HCT: 21.4 % — ABNORMAL LOW (ref 36.0–46.0)
Hemoglobin: 7.2 g/dL — ABNORMAL LOW (ref 12.0–15.0)

## 2022-04-19 LAB — ECHO INTRAOPERATIVE TEE
Height: 71 in
Weight: 3392 oz

## 2022-04-19 LAB — BASIC METABOLIC PANEL
Anion gap: 5 (ref 5–15)
BUN: 14 mg/dL (ref 8–23)
CO2: 20 mmol/L — ABNORMAL LOW (ref 22–32)
Calcium: 8.1 mg/dL — ABNORMAL LOW (ref 8.9–10.3)
Chloride: 115 mmol/L — ABNORMAL HIGH (ref 98–111)
Creatinine, Ser: 1 mg/dL (ref 0.44–1.00)
GFR, Estimated: >60 mL/min (ref >60)
Glucose, Bld: 108 mg/dL — ABNORMAL HIGH (ref 70–99)
Potassium: 4.7 mmol/L (ref 3.5–5.1)
Sodium: 140 mmol/L (ref 135–145)

## 2022-04-19 LAB — PROTIME-INR
INR: 1.4 — ABNORMAL HIGH (ref 0.8–1.2)
Prothrombin Time: 16.6 seconds — ABNORMAL HIGH (ref 11.4–15.2)

## 2022-04-19 LAB — APTT: aPTT: 38 seconds — ABNORMAL HIGH (ref 24–36)

## 2022-04-19 LAB — PLATELET COUNT: Platelets: 94 10*3/uL — ABNORMAL LOW (ref 150–400)

## 2022-04-19 LAB — MAGNESIUM: Magnesium: 2.8 mg/dL — ABNORMAL HIGH (ref 1.7–2.4)

## 2022-04-19 LAB — ABO/RH: ABO/RH(D): B POS

## 2022-04-19 SURGERY — REPAIR, MITRAL VALVE
Anesthesia: General

## 2022-04-19 MED ORDER — SODIUM CHLORIDE 0.9 % IV SOLN: 250.0000 mL | INTRAVENOUS | Status: DC

## 2022-04-19 MED ORDER — MIDAZOLAM HCL (PF) 10 MG/2ML IJ SOLN
INTRAMUSCULAR | Status: AC
  Filled 2022-04-19: qty 2

## 2022-04-19 MED ORDER — ACETAMINOPHEN 160 MG/5ML PO SOLN: 1000.0000 mg | Freq: Four times a day (QID) | ORAL | Status: DC

## 2022-04-19 MED ORDER — ALBUMIN HUMAN 5 % IV SOLN: INTRAVENOUS | Status: DC | PRN

## 2022-04-19 MED ORDER — BUPROPION HCL ER (XL) 300 MG PO TB24
300.0000 mg | ORAL_TABLET | Freq: Every morning | ORAL | Status: DC
  Administered 2022-04-20 – 2022-04-28 (×9): 300 mg via ORAL
  Filled 2022-04-19 (×9): qty 1

## 2022-04-19 MED ORDER — ESTROGENS CONJUGATED 0.45 MG PO TABS
0.4500 mg | ORAL_TABLET | Freq: Every day | ORAL | Status: DC
  Administered 2022-04-20 – 2022-04-28 (×9): 0.45 mg via ORAL
  Filled 2022-04-19 (×10): qty 1

## 2022-04-19 MED ORDER — LACTATED RINGERS IV SOLN: INTRAVENOUS | Status: DC

## 2022-04-19 MED ORDER — ONDANSETRON HCL 4 MG/2ML IJ SOLN: 4.0000 mg | Freq: Four times a day (QID) | INTRAMUSCULAR | Status: DC | PRN

## 2022-04-19 MED ORDER — METOPROLOL TARTRATE 5 MG/5ML IV SOLN: 2.5000 mg | INTRAVENOUS | Status: DC | PRN

## 2022-04-19 MED ORDER — GELATIN ABSORBABLE MT POWD
OROMUCOSAL | Status: DC | PRN
  Administered 2022-04-19: 16 mL via TOPICAL

## 2022-04-19 MED ORDER — CHLORHEXIDINE GLUCONATE 0.12 % MT SOLN
15.0000 mL | OROMUCOSAL | Status: AC
  Administered 2022-04-19: 15 mL via OROMUCOSAL
  Filled 2022-04-19: qty 15

## 2022-04-19 MED ORDER — EPHEDRINE 5 MG/ML INJ
INTRAVENOUS | Status: AC
  Filled 2022-04-19: qty 5

## 2022-04-19 MED ORDER — VECURONIUM BROMIDE 10 MG IV SOLR
INTRAVENOUS | Status: AC
  Filled 2022-04-19: qty 10

## 2022-04-19 MED ORDER — 0.9 % SODIUM CHLORIDE (POUR BTL) OPTIME
TOPICAL | Status: DC | PRN
  Administered 2022-04-19: 6000 mL

## 2022-04-19 MED ORDER — ALBUMIN HUMAN 5 % IV SOLN
250.0000 mL | INTRAVENOUS | Status: AC | PRN
  Administered 2022-04-19 (×4): 12.5 g via INTRAVENOUS
  Filled 2022-04-19 (×2): qty 250

## 2022-04-19 MED ORDER — ALBUTEROL SULFATE (2.5 MG/3ML) 0.083% IN NEBU
2.5000 mg | INHALATION_SOLUTION | Freq: Four times a day (QID) | RESPIRATORY_TRACT | Status: DC | PRN
  Administered 2022-04-24: 2.5 mg via RESPIRATORY_TRACT
  Filled 2022-04-19: qty 3

## 2022-04-19 MED ORDER — HEMOSTATIC AGENTS (NO CHARGE) OPTIME
TOPICAL | Status: DC | PRN
  Administered 2022-04-19 (×2): 1 via TOPICAL

## 2022-04-19 MED ORDER — ACETAMINOPHEN 500 MG PO TABS
1000.0000 mg | ORAL_TABLET | Freq: Four times a day (QID) | ORAL | Status: DC
  Administered 2022-04-20 – 2022-04-22 (×9): 1000 mg via ORAL
  Filled 2022-04-19 (×9): qty 2

## 2022-04-19 MED ORDER — SUFENTANIL CITRATE 50 MCG/ML IV SOLN
INTRAVENOUS | Status: AC
  Filled 2022-04-19: qty 1

## 2022-04-19 MED ORDER — SODIUM CHLORIDE 0.9% FLUSH: 3.0000 mL | INTRAVENOUS | Status: DC | PRN

## 2022-04-19 MED ORDER — MIDAZOLAM HCL 2 MG/2ML IJ SOLN
2.0000 mg | INTRAMUSCULAR | Status: DC | PRN
  Filled 2022-04-19: qty 2

## 2022-04-19 MED ORDER — GABAPENTIN 600 MG PO TABS
600.0000 mg | ORAL_TABLET | Freq: Three times a day (TID) | ORAL | Status: DC
Start: 2022-04-20 — End: 2022-04-29
  Administered 2022-04-20 – 2022-04-28 (×27): 600 mg via ORAL
  Filled 2022-04-19 (×27): qty 1

## 2022-04-19 MED ORDER — BISACODYL 10 MG RE SUPP: 10.0000 mg | Freq: Every day | RECTAL | Status: DC

## 2022-04-19 MED ORDER — MIDAZOLAM HCL 5 MG/5ML IJ SOLN
INTRAMUSCULAR | Status: DC | PRN
  Administered 2022-04-19 (×2): 2 mg via INTRAVENOUS
  Administered 2022-04-19 (×3): 3 mg via INTRAVENOUS
  Administered 2022-04-19: 5 mg via INTRAVENOUS
  Administered 2022-04-19: 2 mg via INTRAVENOUS

## 2022-04-19 MED ORDER — DEXMEDETOMIDINE HCL IN NACL 400 MCG/100ML IV SOLN
0.0000 ug/kg/h | INTRAVENOUS | Status: DC
  Administered 2022-04-19: 0.7 ug/kg/h via INTRAVENOUS
  Filled 2022-04-19: qty 100

## 2022-04-19 MED ORDER — SODIUM CHLORIDE 0.9 % IV SOLN: INTRAVENOUS | Status: DC

## 2022-04-19 MED ORDER — SODIUM CHLORIDE 0.9% FLUSH
3.0000 mL | Freq: Two times a day (BID) | INTRAVENOUS | Status: DC
  Administered 2022-04-20 – 2022-04-21 (×4): 3 mL via INTRAVENOUS

## 2022-04-19 MED ORDER — METOPROLOL TARTRATE 25 MG/10 ML ORAL SUSPENSION: 12.5000 mg | Freq: Two times a day (BID) | ORAL | Status: DC

## 2022-04-19 MED ORDER — CEFAZOLIN SODIUM-DEXTROSE 2-4 GM/100ML-% IV SOLN
2.0000 g | Freq: Three times a day (TID) | INTRAVENOUS | Status: AC
  Administered 2022-04-19 – 2022-04-21 (×6): 2 g via INTRAVENOUS
  Filled 2022-04-19 (×6): qty 100

## 2022-04-19 MED ORDER — SODIUM CHLORIDE 0.9 % IV SOLN: 1.0000 g | Freq: Once | INTRAVENOUS | Status: DC

## 2022-04-19 MED ORDER — ASPIRIN EC 325 MG PO TBEC: 325.0000 mg | DELAYED_RELEASE_TABLET | Freq: Every day | ORAL | Status: DC

## 2022-04-19 MED ORDER — VECURONIUM BROMIDE 10 MG IV SOLR
INTRAVENOUS | Status: DC | PRN
  Administered 2022-04-19: 10 mg via INTRAVENOUS
  Administered 2022-04-19: 2 mg via INTRAVENOUS
  Administered 2022-04-19: 6 mg via INTRAVENOUS
  Administered 2022-04-19: 2 mg via INTRAVENOUS

## 2022-04-19 MED ORDER — ACETAMINOPHEN 650 MG RE SUPP
650.0000 mg | Freq: Once | RECTAL | Status: AC
  Administered 2022-04-19: 650 mg via RECTAL

## 2022-04-19 MED ORDER — HEPARIN SODIUM (PORCINE) 1000 UNIT/ML IJ SOLN
INTRAMUSCULAR | Status: DC | PRN
  Administered 2022-04-19: 30000 [IU] via INTRAVENOUS

## 2022-04-19 MED ORDER — NITROGLYCERIN IN D5W 200-5 MCG/ML-% IV SOLN: 0.0000 ug/min | INTRAVENOUS | Status: DC

## 2022-04-19 MED ORDER — ORAL CARE MOUTH RINSE: 15.0000 mL | Freq: Once | OROMUCOSAL | Status: AC

## 2022-04-19 MED ORDER — LACTATED RINGERS IV SOLN: INTRAVENOUS | Status: DC | PRN

## 2022-04-19 MED ORDER — DOCUSATE SODIUM 100 MG PO CAPS
200.0000 mg | ORAL_CAPSULE | Freq: Every day | ORAL | Status: DC
  Administered 2022-04-20 – 2022-04-21 (×2): 200 mg via ORAL
  Filled 2022-04-19 (×2): qty 2

## 2022-04-19 MED ORDER — TRAZODONE HCL 150 MG PO TABS
250.0000 mg | ORAL_TABLET | Freq: Every evening | ORAL | Status: DC | PRN
  Administered 2022-04-22 – 2022-04-28 (×7): 250 mg via ORAL
  Filled 2022-04-19 (×7): qty 1

## 2022-04-19 MED ORDER — BACLOFEN 10 MG PO TABS
10.0000 mg | ORAL_TABLET | Freq: Two times a day (BID) | ORAL | Status: DC
  Administered 2022-04-20 – 2022-04-28 (×16): 10 mg via ORAL
  Filled 2022-04-19 (×19): qty 1

## 2022-04-19 MED ORDER — PHENYLEPHRINE HCL-NACL 20-0.9 MG/250ML-% IV SOLN
0.0000 ug/min | INTRAVENOUS | Status: DC
  Administered 2022-04-20: 10 ug/min via INTRAVENOUS

## 2022-04-19 MED ORDER — ASPIRIN 81 MG PO CHEW: 324.0000 mg | CHEWABLE_TABLET | Freq: Every day | ORAL | Status: DC

## 2022-04-19 MED ORDER — MAGNESIUM SULFATE 4 GM/100ML IV SOLN
4.0000 g | Freq: Once | INTRAVENOUS | Status: AC
  Administered 2022-04-19: 4 g via INTRAVENOUS
  Filled 2022-04-19: qty 100

## 2022-04-19 MED ORDER — PROTAMINE SULFATE 10 MG/ML IV SOLN
INTRAVENOUS | Status: AC
  Filled 2022-04-19: qty 5

## 2022-04-19 MED ORDER — LIFITEGRAST 5 % OP SOLN: 1.0000 [drp] | Freq: Two times a day (BID) | OPHTHALMIC | Status: DC

## 2022-04-19 MED ORDER — CHLORHEXIDINE GLUCONATE 4 % EX LIQD: 30.0000 mL | CUTANEOUS | Status: DC

## 2022-04-19 MED ORDER — CHLORHEXIDINE GLUCONATE CLOTH 2 % EX PADS
6.0000 | MEDICATED_PAD | Freq: Every day | CUTANEOUS | Status: DC
  Administered 2022-04-20 – 2022-04-21 (×2): 6 via TOPICAL

## 2022-04-19 MED ORDER — DEXTROSE 50 % IV SOLN: 0.0000 mL | INTRAVENOUS | Status: DC | PRN

## 2022-04-19 MED ORDER — PANTOPRAZOLE SODIUM 40 MG PO TBEC
40.0000 mg | DELAYED_RELEASE_TABLET | Freq: Every day | ORAL | Status: DC
  Administered 2022-04-20 – 2022-04-28 (×9): 40 mg via ORAL
  Filled 2022-04-19 (×9): qty 1

## 2022-04-19 MED ORDER — CALCIUM CHLORIDE 10 % IV SOLN
1.0000 g | Freq: Once | INTRAVENOUS | Status: AC
  Administered 2022-04-19: 1 g via INTRAVENOUS

## 2022-04-19 MED ORDER — METOPROLOL TARTRATE 12.5 MG HALF TABLET
12.5000 mg | ORAL_TABLET | Freq: Once | ORAL | Status: DC
  Filled 2022-04-19: qty 1

## 2022-04-19 MED ORDER — METOPROLOL TARTRATE 12.5 MG HALF TABLET: 12.5000 mg | ORAL_TABLET | Freq: Two times a day (BID) | ORAL | Status: DC

## 2022-04-19 MED ORDER — BISACODYL 5 MG PO TBEC
10.0000 mg | DELAYED_RELEASE_TABLET | Freq: Every day | ORAL | Status: DC
  Administered 2022-04-20 – 2022-04-21 (×2): 10 mg via ORAL
  Filled 2022-04-19 (×2): qty 2

## 2022-04-19 MED ORDER — SODIUM CHLORIDE 0.9% IV SOLUTION: Freq: Once | INTRAVENOUS | Status: DC

## 2022-04-19 MED ORDER — SODIUM CHLORIDE 0.9% FLUSH
10.0000 mL | Freq: Two times a day (BID) | INTRAVENOUS | Status: DC
  Administered 2022-04-20 (×2): 10 mL
  Administered 2022-04-20: 20 mL
  Administered 2022-04-21: 10 mL

## 2022-04-19 MED ORDER — PROPOFOL 10 MG/ML IV BOLUS
INTRAVENOUS | Status: DC | PRN
Start: 2022-04-19 — End: 2022-04-19
  Administered 2022-04-19: 50 mg via INTRAVENOUS

## 2022-04-19 MED ORDER — FAMOTIDINE IN NACL 20-0.9 MG/50ML-% IV SOLN
20.0000 mg | Freq: Two times a day (BID) | INTRAVENOUS | Status: AC
  Administered 2022-04-19: 20 mg via INTRAVENOUS
  Filled 2022-04-19: qty 50

## 2022-04-19 MED ORDER — CHLORHEXIDINE GLUCONATE 0.12 % MT SOLN
15.0000 mL | Freq: Once | OROMUCOSAL | Status: AC
  Administered 2022-04-19: 15 mL via OROMUCOSAL
  Filled 2022-04-19: qty 15

## 2022-04-19 MED ORDER — POTASSIUM CHLORIDE 10 MEQ/50ML IV SOLN: 10.0000 meq | INTRAVENOUS | Status: AC

## 2022-04-19 MED ORDER — SODIUM CHLORIDE 0.45 % IV SOLN: INTRAVENOUS | Status: DC | PRN

## 2022-04-19 MED ORDER — LACTATED RINGERS IV SOLN: 500.0000 mL | Freq: Once | INTRAVENOUS | Status: DC | PRN

## 2022-04-19 MED ORDER — SODIUM CHLORIDE 0.9% FLUSH: 10.0000 mL | INTRAVENOUS | Status: DC | PRN

## 2022-04-19 MED ORDER — HEPARIN SODIUM (PORCINE) 1000 UNIT/ML IJ SOLN
INTRAMUSCULAR | Status: AC
  Filled 2022-04-19: qty 1

## 2022-04-19 MED ORDER — METHOCARBAMOL 500 MG PO TABS
500.0000 mg | ORAL_TABLET | Freq: Four times a day (QID) | ORAL | Status: DC
  Administered 2022-04-20 – 2022-04-28 (×36): 500 mg via ORAL
  Filled 2022-04-19 (×36): qty 1

## 2022-04-19 MED ORDER — PROPOFOL 10 MG/ML IV BOLUS
INTRAVENOUS | Status: AC
  Filled 2022-04-19: qty 20

## 2022-04-19 MED ORDER — INSULIN ASPART 100 UNIT/ML IJ SOLN: 0.0000 [IU] | INTRAMUSCULAR | Status: DC | PRN

## 2022-04-19 MED ORDER — ACETAMINOPHEN 160 MG/5ML PO SOLN: 650.0000 mg | Freq: Once | ORAL | Status: AC

## 2022-04-19 MED ORDER — INSULIN REGULAR(HUMAN) IN NACL 100-0.9 UT/100ML-% IV SOLN: INTRAVENOUS | Status: DC

## 2022-04-19 MED ORDER — VANCOMYCIN HCL IN DEXTROSE 1-5 GM/200ML-% IV SOLN
1000.0000 mg | Freq: Once | INTRAVENOUS | Status: AC
  Administered 2022-04-19: 1000 mg via INTRAVENOUS
  Filled 2022-04-19: qty 200

## 2022-04-19 MED ORDER — PROTAMINE SULFATE 10 MG/ML IV SOLN
INTRAVENOUS | Status: AC
  Filled 2022-04-19: qty 25

## 2022-04-19 MED ORDER — ATORVASTATIN CALCIUM 10 MG PO TABS
20.0000 mg | ORAL_TABLET | Freq: Every day | ORAL | Status: DC
  Administered 2022-04-20 – 2022-04-28 (×9): 20 mg via ORAL
  Filled 2022-04-19 (×9): qty 2

## 2022-04-19 MED ORDER — HYDROXYZINE HCL 25 MG PO TABS
25.0000 mg | ORAL_TABLET | Freq: Four times a day (QID) | ORAL | Status: DC
  Administered 2022-04-20 – 2022-04-28 (×36): 25 mg via ORAL
  Filled 2022-04-19 (×36): qty 1

## 2022-04-19 MED ORDER — HEMOSTATIC AGENTS (NO CHARGE) OPTIME
TOPICAL | Status: DC | PRN
  Administered 2022-04-19: 1 via TOPICAL

## 2022-04-19 MED ORDER — PROTAMINE SULFATE 10 MG/ML IV SOLN
INTRAVENOUS | Status: DC | PRN
  Administered 2022-04-19 (×2): 150 mg via INTRAVENOUS

## 2022-04-19 MED ORDER — ~~LOC~~ CARDIAC SURGERY, PATIENT & FAMILY EDUCATION
Freq: Once | Status: DC
  Filled 2022-04-19: qty 1

## 2022-04-19 MED ORDER — TRAMADOL HCL 50 MG PO TABS
50.0000 mg | ORAL_TABLET | ORAL | Status: DC | PRN
  Administered 2022-04-23 – 2022-04-24 (×3): 100 mg via ORAL
  Administered 2022-04-25 – 2022-04-26 (×2): 50 mg via ORAL
  Filled 2022-04-19: qty 2
  Filled 2022-04-19 (×2): qty 1
  Filled 2022-04-19 (×3): qty 2

## 2022-04-19 MED ORDER — CHLORHEXIDINE GLUCONATE 0.12 % MT SOLN: 15.0000 mL | Freq: Once | OROMUCOSAL | Status: DC

## 2022-04-19 MED ORDER — PHENYLEPHRINE 80 MCG/ML (10ML) SYRINGE FOR IV PUSH (FOR BLOOD PRESSURE SUPPORT)
PREFILLED_SYRINGE | INTRAVENOUS | Status: AC
  Filled 2022-04-19: qty 10

## 2022-04-19 MED ORDER — OXYCODONE HCL 5 MG PO TABS
5.0000 mg | ORAL_TABLET | ORAL | Status: DC | PRN
  Administered 2022-04-20 – 2022-04-23 (×19): 10 mg via ORAL
  Filled 2022-04-19 (×19): qty 2

## 2022-04-19 MED ORDER — SUFENTANIL CITRATE 50 MCG/ML IV SOLN
INTRAVENOUS | Status: DC | PRN
  Administered 2022-04-19: 10 ug via INTRAVENOUS
  Administered 2022-04-19: 20 ug via INTRAVENOUS
  Administered 2022-04-19 (×2): 10 ug via INTRAVENOUS
  Administered 2022-04-19: 20 ug via INTRAVENOUS
  Administered 2022-04-19: 10 ug via INTRAVENOUS
  Administered 2022-04-19: 20 ug via INTRAVENOUS

## 2022-04-19 MED ORDER — MORPHINE SULFATE (PF) 2 MG/ML IV SOLN
1.0000 mg | INTRAVENOUS | Status: DC | PRN
  Administered 2022-04-19 – 2022-04-20 (×4): 4 mg via INTRAVENOUS
  Administered 2022-04-20 (×2): 2 mg via INTRAVENOUS
  Filled 2022-04-19 (×4): qty 2
  Filled 2022-04-19: qty 1
  Filled 2022-04-19: qty 2
  Filled 2022-04-19: qty 1

## 2022-04-19 MED ORDER — EPHEDRINE SULFATE-NACL 50-0.9 MG/10ML-% IV SOSY
PREFILLED_SYRINGE | INTRAVENOUS | Status: DC | PRN
Start: 2022-04-19 — End: 2022-04-19
  Administered 2022-04-19 (×2): 10 mg via INTRAVENOUS

## 2022-04-19 SURGICAL SUPPLY — 103 items
ADAPTER CARDIO PERF ANTE/RETRO (ADAPTER) ×2 IMPLANT
ADPR PRFSN 84XANTGRD RTRGD (ADAPTER) ×1
BAG DECANTER FOR FLEXI CONT (MISCELLANEOUS) ×2 IMPLANT
BINDER BREAST XXLRG (GAUZE/BANDAGES/DRESSINGS) ×1 IMPLANT
BLADE CLIPPER SURG (BLADE) ×2 IMPLANT
BLADE STERNUM SYSTEM 6 (BLADE) ×2 IMPLANT
BLADE SURG 15 STRL LF DISP TIS (BLADE) ×1 IMPLANT
BLADE SURG 15 STRL SS (BLADE) ×2
CANISTER SUCT 3000ML PPV (MISCELLANEOUS) ×2 IMPLANT
CANNULA ARTERIAL NVNT 3/8 22FR (MISCELLANEOUS) IMPLANT
CANNULA GUNDRY RCSP 15FR (MISCELLANEOUS) ×1 IMPLANT
CANNULA SUMP PERICARDIAL (CANNULA) ×2 IMPLANT
CATH FOLEY 2WAY  3CC 10FR (CATHETERS) ×2
CATH FOLEY 2WAY 3CC 10FR (CATHETERS) IMPLANT
CATH FOLEY 2WAY SLVR  5CC 12FR (CATHETERS) ×2
CATH FOLEY 2WAY SLVR 5CC 12FR (CATHETERS) IMPLANT
CATH HEART VENT LEFT (CATHETERS) IMPLANT
CATH ROBINSON RED A/P 18FR (CATHETERS) IMPLANT
CLIP FOGARTY SPRING 6M (CLIP) IMPLANT
CNTNR URN SCR LID CUP LEK RST (MISCELLANEOUS) IMPLANT
CONN 1/2X1/2X1/2  BEN (MISCELLANEOUS) ×4
CONN 1/2X1/2X1/2 BEN (MISCELLANEOUS) ×1 IMPLANT
CONN 3/8X1/2 ST GISH (MISCELLANEOUS) ×4 IMPLANT
CONN ST 1/2X1/2  BEN (MISCELLANEOUS) ×2
CONN ST 1/2X1/2 BEN (MISCELLANEOUS) IMPLANT
CONN ST 3/8 X 1/2 (MISCELLANEOUS) ×1 IMPLANT
CONN Y 3/8X3/8X3/8  BEN (MISCELLANEOUS)
CONN Y 3/8X3/8X3/8 BEN (MISCELLANEOUS) IMPLANT
CONT SPEC 4OZ STRL OR WHT (MISCELLANEOUS) ×4
CONTAINER PROTECT SURGISLUSH (MISCELLANEOUS) ×4 IMPLANT
DEVICE SUT CK QUICK LOAD MINI (Prosthesis & Implant Heart) ×2 IMPLANT
DRAPE CARDIOVASCULAR INCISE (DRAPES) ×2
DRAPE SRG 135X102X78XABS (DRAPES) ×1 IMPLANT
DRAPE WARM FLUID 44X44 (DRAPES) IMPLANT
DRSG AQUACEL AG ADV 3.5X14 (GAUZE/BANDAGES/DRESSINGS) ×1 IMPLANT
DRSG COVADERM 4X10 (GAUZE/BANDAGES/DRESSINGS) IMPLANT
DRSG COVADERM 4X14 (GAUZE/BANDAGES/DRESSINGS) ×1 IMPLANT
ELECT CAUTERY BLADE 6.4 (BLADE) IMPLANT
ELECT REM PT RETURN 9FT ADLT (ELECTROSURGICAL) ×4
ELECTRODE REM PT RTRN 9FT ADLT (ELECTROSURGICAL) ×2 IMPLANT
FELT TEFLON 1X6 (MISCELLANEOUS) ×4 IMPLANT
GAUZE 4X4 16PLY ~~LOC~~+RFID DBL (SPONGE) ×2 IMPLANT
GAUZE SPONGE 4X4 12PLY STRL (GAUZE/BANDAGES/DRESSINGS) ×4 IMPLANT
GAUZE SPONGE 4X4 12PLY STRL LF (GAUZE/BANDAGES/DRESSINGS) ×1 IMPLANT
GLOVE SURG SIGNA 7.5 PF LTX (GLOVE) IMPLANT
GOWN STRL REUS W/ TWL LRG LVL3 (GOWN DISPOSABLE) ×4 IMPLANT
GOWN STRL REUS W/TWL LRG LVL3 (GOWN DISPOSABLE) ×8
HEMOSTAT POWDER SURGIFOAM 1G (HEMOSTASIS) ×4 IMPLANT
HEMOSTAT SURGICEL 2X14 (HEMOSTASIS) ×1 IMPLANT
INSERT FOGARTY XLG (MISCELLANEOUS) IMPLANT
KIT BASIN OR (CUSTOM PROCEDURE TRAY) ×2 IMPLANT
KIT SUCTION CATH 14FR (SUCTIONS) ×2 IMPLANT
KIT SUT CK MINI COMBO 4X17 (Prosthesis & Implant Heart) ×1 IMPLANT
KIT TURNOVER KIT B (KITS) ×2 IMPLANT
LINE VENT (MISCELLANEOUS) ×1 IMPLANT
NS IRRIG 1000ML POUR BTL (IV SOLUTION) ×8 IMPLANT
PACK OPEN HEART (CUSTOM PROCEDURE TRAY) ×2 IMPLANT
PAD ARMBOARD 7.5X6 YLW CONV (MISCELLANEOUS) ×4 IMPLANT
POSITIONER HEAD DONUT 9IN (MISCELLANEOUS) ×2 IMPLANT
RING ANLPLS CARP-EDW PHY II 30 (Prosthesis & Implant Heart) IMPLANT
RING ANNULOPLASTY PHY II (Prosthesis & Implant Heart) ×2 IMPLANT
RING TRICUSPID T32 (Prosthesis & Implant Heart) ×1 IMPLANT
SEALANT PATCH FIBRIN 2X4IN (MISCELLANEOUS) ×2 IMPLANT
SET MPS 3-ND DEL (MISCELLANEOUS) ×1 IMPLANT
SPONGE T-LAP 18X18 ~~LOC~~+RFID (SPONGE) ×8 IMPLANT
SPONGE T-LAP 4X18 ~~LOC~~+RFID (SPONGE) ×2 IMPLANT
SUT ETHIBOND 2 0 SH (SUTURE) ×8 IMPLANT
SUT ETHIBOND 2 0 SH 36X2 (SUTURE) ×2 IMPLANT
SUT ETHIBOND 2 0 V4 (SUTURE) ×7 IMPLANT
SUT ETHIBOND 2 0V4 GREEN (SUTURE) ×7 IMPLANT
SUT ETHIBOND 4 0 RB 1 (SUTURE) ×2 IMPLANT
SUT PROLENE 3 0 SH DA (SUTURE) ×2 IMPLANT
SUT PROLENE 3 0 SH1 36 (SUTURE) ×2 IMPLANT
SUT PROLENE 4 0 RB 1 (SUTURE) ×26
SUT PROLENE 4 0 SH DA (SUTURE) ×6 IMPLANT
SUT PROLENE 4-0 RB1 .5 CRCL 36 (SUTURE) ×2 IMPLANT
SUT PROLENE 5 0 C 1 36 (SUTURE) ×5 IMPLANT
SUT PROLENE 5 0 CC1 (SUTURE) ×2 IMPLANT
SUT PROLENE 6 0 C 1 30 (SUTURE) ×1 IMPLANT
SUT SILK  1 MH (SUTURE) ×4
SUT SILK 1 MH (SUTURE) ×2 IMPLANT
SUT SILK 1 TIES 10X30 (SUTURE) ×2 IMPLANT
SUT SILK 2 0 (SUTURE) ×2
SUT SILK 2 0 SH CR/8 (SUTURE) ×4 IMPLANT
SUT SILK 2-0 18XBRD TIE 12 (SUTURE) ×1 IMPLANT
SUT SILK 3 0 SH CR/8 (SUTURE) ×2 IMPLANT
SUT SILK 4 0 (SUTURE) ×2
SUT SILK 4-0 18XBRD TIE 12 (SUTURE) ×1 IMPLANT
SUT STEEL 6MS V (SUTURE) IMPLANT
SUT TEM PAC WIRE 2 0 SH (SUTURE) ×8 IMPLANT
SUT VIC AB 1 CTX 27 (SUTURE) ×2 IMPLANT
SUT VIC AB 1 CTX 36 (SUTURE)
SUT VIC AB 1 CTX36XBRD ANBCTR (SUTURE) IMPLANT
SUT VIC AB 2-0 CTX 27 (SUTURE) IMPLANT
SUT VIC AB 3-0 X1 27 (SUTURE) IMPLANT
SYSTEM SAHARA CHEST DRAIN ATS (WOUND CARE) ×2 IMPLANT
TAPE PAPER 3X10 WHT MICROPORE (GAUZE/BANDAGES/DRESSINGS) ×1 IMPLANT
TOWEL GREEN STERILE (TOWEL DISPOSABLE) ×2 IMPLANT
TOWEL GREEN STERILE FF (TOWEL DISPOSABLE) ×2 IMPLANT
TRAY FOLEY SLVR 16FR TEMP STAT (SET/KITS/TRAYS/PACK) ×2 IMPLANT
UNDERPAD 30X36 HEAVY ABSORB (UNDERPADS AND DIAPERS) ×2 IMPLANT
VENT LEFT HEART 12002 (CATHETERS) ×2
WATER STERILE IRR 1000ML POUR (IV SOLUTION) ×4 IMPLANT

## 2022-04-19 NOTE — Anesthesia Procedure Notes (Signed)
Central Venous Catheter Insertion ?Performed by: Audry Pili, MD, anesthesiologist ?Start/End5/15/2023 7:24 AM, 04/19/2022 7:26 AM ?Patient location: Pre-op. ?Preanesthetic checklist: patient identified, IV checked, risks and benefits discussed, surgical consent, monitors and equipment checked, pre-op evaluation, timeout performed and anesthesia consent ?Position: Trendelenburg ?Hand hygiene performed  and maximum sterile barriers used  ?Total catheter length 10. ?PA cath was placed.Swan type:thermodilution ?PA Cath depth:52 ?Procedure performed without using ultrasound guided technique. ?Attempts: 1 ?Patient tolerated the procedure well with no immediate complications. ? ? ? ?

## 2022-04-19 NOTE — Anesthesia Postprocedure Evaluation (Signed)
Anesthesia Post Note ? ?Patient: [name] ? ?Procedure(s) Performed: MITRAL VALVE REPAIR WITH 30MM PHYSIO II ANNULOPLASTY RING ?TRICUSPID VALVE REPAIR WITH 32MM MC3 ANNULOPLASTY RING ?TRANSESOPHAGEAL ECHOCARDIOGRAM (TEE) ? ?  ? ?Patient location during evaluation: ICU ?Anesthesia Type: General ?Level of consciousness: sedated and patient remains intubated per anesthesia plan ?Pain management: pain level controlled ?Vital Signs Assessment: post-procedure vital signs reviewed and stable ?Respiratory status: patient remains intubated per anesthesia plan ?Cardiovascular status: stable ?Postop Assessment: no apparent nausea or vomiting ?Anesthetic complications: no ? ? ?No notable events documented. ? ?Last Vitals:  ?Vitals:  ? 04/19/22 0742 04/19/22 1342  ?BP:  (!) 142/94  ?Pulse: 60 80  ?Resp:  12  ?Temp:    ?SpO2: 100% 100%  ?  ? ? ?  ?  ?  ?  ?  ?  ? ?Audry Pili ? ? ? ? ?

## 2022-04-19 NOTE — Hospital Course (Addendum)
HPI: This is a 61 year old woman with a past history of paroxysmal atrial fibrillation, hepatitis C (treated), hypertension, type 2 diabetes complicated by neuropathy, fibromyalgia, arthritis, chronic neck pain, and chronic low back pain.  She had a history of mitral regurgitation.  She presented with shortness of breath in January.  She saw Dr. Julieanne Manson.  An echocardiogram was done which showed severe mitral regurgitation and moderate to severe tricuspid regurgitation.  There was preserved left ventricular function.  She had right and left heart catheterization.  She had normal coronaries.  She did have mildly elevated pulmonary artery pressure she had a large V wave.  She continues to have shortness of breath with exertion.  Has improved with medical therapy but has not resolved.  She also intermittently has orthopnea.  Denies peripheral edema.  No chest pain, pressure, or tightness.  Dr. Roxan Hockey discussed with her the proposed operative procedure which would be mitral valve repair or replacement and tricuspid valve repair.  Dr. Roxan Hockey informed her of the general nature of the procedure including the need for general anesthesia, the incisions to be used, the use of cardiopulmonary bypass, and the use of drainage tubes and temporary pacemaker wires postoperatively. She agreed to proceed with surgery.  Hospital Course: Patient underwent

## 2022-04-19 NOTE — Transfer of Care (Signed)
Immediate Anesthesia Transfer of Care Note ? ?Patient: [name] ? ?Procedure(s) Performed: MITRAL VALVE REPAIR WITH 30MM PHYSIO II ANNULOPLASTY RING ?TRICUSPID VALVE REPAIR WITH 32MM MC3 ANNULOPLASTY RING ?TRANSESOPHAGEAL ECHOCARDIOGRAM (TEE) ? ?Patient Location: ICU ? ?Anesthesia Type:General ? ?Level of Consciousness: unresponsive and Patient remains intubated per anesthesia plan ? ?Airway & Oxygen Therapy: Patient remains intubated per anesthesia plan and Patient placed on Ventilator (see vital sign flow sheet for setting) ? ?Post-op Assessment: Report given to RN, Post -op Vital signs reviewed and stable and Patient moving all extremities ? ?Post vital signs: Reviewed and stable ? ?Last Vitals:  ?Vitals Value Taken Time  ?BP    ?Temp 36.1 ?C 04/19/22 1349  ?Pulse 80 04/19/22 1349  ?Resp 13 04/19/22 1348  ?SpO2 100 % 04/19/22 1349  ?Vitals shown include unvalidated device data. ? ?Last Pain:  ?Vitals:  ? 04/19/22 0651  ?TempSrc:   ?PainSc: 8   ?   ? ?Patients Stated Pain Goal: 3 (04/19/22 7412) ? ?Complications: No notable events documented. ?

## 2022-04-19 NOTE — Anesthesia Procedure Notes (Signed)
Arterial Line Insertion ?Start/End5/15/2023 7:30 AM, 04/19/2022 7:40 AM ?Performed by: Moshe Salisbury, CRNA, CRNA ? Patient location: Pre-op. ?Preanesthetic checklist: patient identified, IV checked, site marked, risks and benefits discussed, surgical consent, monitors and equipment checked, pre-op evaluation, timeout performed and anesthesia consent ?Lidocaine 1% used for infiltration and patient sedated ?Left, radial was placed ?Catheter size: 20 G ?Hand hygiene performed , maximum sterile barriers used  and Seldinger technique used ? ?Attempts: 1 ?Procedure performed without using ultrasound guided technique. ?Following insertion, dressing applied and Biopatch. ?Post procedure assessment: normal ? ?Patient tolerated the procedure well with no immediate complications. ? ? ?

## 2022-04-19 NOTE — Brief Op Note (Addendum)
04/19/2022 ? ?11:46 AM ? ?PATIENT:  [name]  61 y.o. female ? ?PRE-OPERATIVE DIAGNOSIS:  1. Severe MR ?2. Severe TR ? ?POST-OPERATIVE DIAGNOSIS:  1. Severe MR ?2. Severe TR ? ?PROCEDURE: ?TRANSESOPHAGEAL ECHOCARDIOGRAM (TEE),  ?MITRAL VALVE REPAIR (using Model 5200, Serial # [id number], Size 30 MM) and TRICUSPID VALVE REPAIR (using Model 4900, Serial # [id number], size 32 mm) ? ? ?SURGEON:  Surgeon(s) and Role: ?   Melrose Nakayama, MD - Primary ? ?PHYSICIAN ASSISTANT:Donielle Tacy Dura PA-C ? ?ASSISTANTS: Alessandra Grout RNFA  ? ?ANESTHESIA:   general ? ?EBL:  Per anesthesia, perfusion record ? ?DRAINS:  2 Chest tubes placed in the mediastinum   ? ?COUNTS CORRECT:  YES ? ?DICTATION: .Dragon Dictation ? ?PLAN OF CARE: Admit to inpatient  ? ?PATIENT DISPOSITION:  ICU - intubated and hemodynamically stable. ?  ?Delay start of Pharmacological VTE agent (>24hrs) due to surgical blood loss or risk of bleeding: no ? ?BASELINE WEIGHT: 96.2 kg ? ?

## 2022-04-19 NOTE — Anesthesia Procedure Notes (Signed)
Central Venous Catheter Insertion ?Performed by: Audry Pili, MD, anesthesiologist ?Start/End5/15/2023 7:12 AM, 04/19/2022 7:26 AM ?Patient location: Pre-op. ?Preanesthetic checklist: patient identified, IV checked, risks and benefits discussed, surgical consent, monitors and equipment checked, pre-op evaluation, timeout performed and anesthesia consent ?Position: Trendelenburg ?Lidocaine 1% used for infiltration and patient sedated ?Hand hygiene performed , maximum sterile barriers used  and Seldinger technique used ?Catheter size: 8.5 Fr ?Central line was placed.MAC introducer ?Procedure performed using ultrasound guided technique. ?Ultrasound Notes:anatomy identified, needle tip was noted to be adjacent to the nerve/plexus identified, no ultrasound evidence of intravascular and/or intraneural injection and image(s) printed for medical record ?Attempts: 2 ?Following insertion, line sutured, dressing applied and Biopatch. ?Post procedure assessment: blood return through all ports, no air and free fluid flow ? ?Patient tolerated the procedure well with no immediate complications. ? ? ? ? ?

## 2022-04-19 NOTE — Progress Notes (Signed)
EVENING ROUNDS NOTE : ? ?   ?[address] ?      [place] [address] ?            [phone]   ?              ?Day of Surgery ?Procedure(s) (LRB): ?MITRAL VALVE REPAIR WITH 30MM PHYSIO II ANNULOPLASTY RING (N/A) ?TRICUSPID VALVE REPAIR WITH 32MM MC3 ANNULOPLASTY RING (N/A) ?TRANSESOPHAGEAL ECHOCARDIOGRAM (TEE) (N/A) ? ? ?Total Length of Stay:  LOS: 0 days  ?Events:   ?Low CT ?Low CI, stable BP ?Weaning to extubate ? ? ? ? ?BP (!) 142/94   Pulse 89   Temp (!) 96.4 ?F (35.8 ?C) (Core)   Resp 12   Ht '5\' 11"'$  (1.803 m)   Wt 96.2 kg   LMP 12/06/1976   SpO2 100%   BMI 29.57 kg/m?  ? ?PAP: (24-45)/(14-19) 24/14 ?CO:  [2.5 L/min-3 L/min] 3 L/min ?CI:  [1.1 L/min/m2-1.4 L/min/m2] 1.4 L/min/m2 ? ?Vent Mode: SIMV;PSV;PRVC ?FiO2 (%):  [50 %] 50 % ?Set Rate:  [12 bmp] 12 bmp ?Vt Set:  [560 mL] 560 mL ?PEEP:  [5 cmH20] 5 cmH20 ?Pressure Support:  [10 cmH20] 10 cmH20 ?Plateau Pressure:  [18 cmH20] 18 cmH20 ? ? sodium chloride 10 mL/hr at 04/19/22 1415  ? [START ON 04/20/2022] sodium chloride    ? sodium chloride 10 mL/hr at 04/19/22 1416  ?  ceFAZolin (ANCEF) IV 2 g (04/19/22 1441)  ? dexmedetomidine (PRECEDEX) IV infusion 0.7 mcg/kg/hr (04/19/22 1524)  ? famotidine (PEPCID) IV 20 mg (04/19/22 1402)  ? insulin 0.8 Units/hr (04/19/22 1410)  ? lactated ringers    ? lactated ringers    ? lactated ringers 20 mL/hr at 04/19/22 1413  ? magnesium sulfate 4 g (04/19/22 1403)  ? nitroGLYCERIN 0 mcg/min (04/19/22 1413)  ? phenylephrine (NEO-SYNEPHRINE) Adult infusion 0 mcg/min (04/19/22 1412)  ? potassium chloride    ? vancomycin    ? ? ?No intake/output data recorded. ? ? ? ?  Latest Ref Rng & Units 04/19/2022  ?  1:56 PM 04/19/2022  ?  1:06 PM 04/19/2022  ? 12:20 PM  ?CBC  ?WBC 4.0 - 10.5 K/uL 7.3      ?Hemoglobin 12.0 - 15.0 g/dL 9.6   9.2   7.5    ?Hematocrit 36.0 - 46.0 % 29.9   27.0   22.0    ?Platelets 150 - 400 K/uL 156      ? ? ? ?  Latest Ref Rng & Units 04/19/2022  ?  1:06 PM 04/19/2022  ? 12:20 PM 04/19/2022  ?  12:13 PM  ?BMP  ?Glucose 70 - 99 mg/dL 112   211     ?BUN 8 - 23 mg/dL 13   14     ?Creatinine 0.44 - 1.00 mg/dL 0.90   0.80     ?Sodium 135 - 145 mmol/L 142   137   138    ?Potassium 3.5 - 5.1 mmol/L 4.5   5.3   5.3    ?Chloride 98 - 111 mmol/L 107   106     ? ? ?ABG ?   ?Component Value Date/Time  ? PHART 7.374 04/19/2022 1213  ? PCO2ART 38.5 04/19/2022 1213  ? PO2ART 244 (H) 04/19/2022 1213  ? HCO3 22.4 04/19/2022 1213  ? TCO2 22 04/19/2022 1306  ? ACIDBASEDEF 3.0 (H) 04/19/2022 1213  ? O2SAT 100 04/19/2022 1213  ? ? ? ? ? ?Melodie Bouillon, MD ?04/19/2022 4:29 PM ? ? ?

## 2022-04-19 NOTE — Procedures (Signed)
Extubation Procedure Note ? ?Patient Details:   ?Name: [name] ?DOB: [birth year] ?MRN: [id number] ?  ?Airway Documentation:  ?  ?Vent end date: 04/19/22 Vent end time: 2103  ? ?Evaluation ? O2 sats: stable throughout ?Complications: No apparent complications ?Patient did tolerate procedure well. ?Bilateral Breath Sounds: Clear ?  ?Yes ? ?Adriahna Shearman ?04/19/2022, 9:05 PM ? ?

## 2022-04-19 NOTE — Interval H&P Note (Signed)
History and Physical Interval Note: ? ?04/19/2022 ?7:25 AM ? ?[name]  has presented today for surgery, with the diagnosis of MR ?TR.  The various methods of treatment have been discussed with the patient and family. After consideration of risks, benefits and other options for treatment, the patient has consented to  Procedure(s): ?MITRAL VALVE REPAIR OR REPLACEMENT (MVR) (N/A) ?TRICUSPID VALVE REPAIR (N/A) ?TRANSESOPHAGEAL ECHOCARDIOGRAM (TEE) (N/A) as a surgical intervention.  The patient's history has been reviewed, patient examined, no change in status, stable for surgery.  I have reviewed the patient's chart and labs.  Questions were answered to the patient's satisfaction.   ? ? ?Melrose Nakayama ? ? ?

## 2022-04-19 NOTE — Anesthesia Procedure Notes (Signed)
Procedure Name: Intubation ?Date/Time: 04/19/2022 8:08 AM ?Performed by: Moshe Salisbury, CRNA ?Pre-anesthesia Checklist: Patient identified, Emergency Drugs available, Suction available and Patient being monitored ?Patient Re-evaluated:Patient Re-evaluated prior to induction ?Oxygen Delivery Method: Circle System Utilized ?Preoxygenation: Pre-oxygenation with 100% oxygen ?Induction Type: IV induction ?Ventilation: Mask ventilation without difficulty ?Laryngoscope Size: Mac and 3 ?Grade View: Grade I ?Tube type: Oral ?Tube size: 8.0 mm ?Number of attempts: 1 ?Airway Equipment and Method: Stylet (VC exposed with minimal neck extension) ?Placement Confirmation: ETT inserted through vocal cords under direct vision, positive ETCO2 and breath sounds checked- equal and bilateral ?Secured at: 24 cm ?Tube secured with: Tape ?Dental Injury: Teeth and Oropharynx as per pre-operative assessment  ? ? ? ? ?

## 2022-04-19 NOTE — Progress Notes (Signed)
?  Echocardiogram ?Echocardiogram Transesophageal has been performed. ? ?Johny Chess ?04/19/2022, 9:35 AM ?

## 2022-04-20 ENCOUNTER — Encounter (HOSPITAL_COMMUNITY): Payer: Self-pay | Admitting: Thoracic Surgery (Cardiothoracic Vascular Surgery)

## 2022-04-20 ENCOUNTER — Inpatient Hospital Stay (HOSPITAL_COMMUNITY): Payer: Medicare Other

## 2022-04-20 LAB — BASIC METABOLIC PANEL
Anion gap: 5 (ref 5–15)
Anion gap: 8 (ref 5–15)
BUN: 12 mg/dL (ref 8–23)
BUN: 14 mg/dL (ref 8–23)
CO2: 19 mmol/L — ABNORMAL LOW (ref 22–32)
CO2: 20 mmol/L — ABNORMAL LOW (ref 22–32)
Calcium: 7.7 mg/dL — ABNORMAL LOW (ref 8.9–10.3)
Calcium: 8 mg/dL — ABNORMAL LOW (ref 8.9–10.3)
Chloride: 110 mmol/L (ref 98–111)
Chloride: 109 mmol/L (ref 98–111)
Creatinine, Ser: 1.11 mg/dL — ABNORMAL HIGH (ref 0.44–1.00)
Creatinine, Ser: 1.38 mg/dL — ABNORMAL HIGH (ref 0.44–1.00)
GFR, Estimated: 56 mL/min — ABNORMAL LOW (ref >60)
GFR, Estimated: 43 mL/min — ABNORMAL LOW (ref >60)
Glucose, Bld: 125 mg/dL — ABNORMAL HIGH (ref 70–99)
Glucose, Bld: 137 mg/dL — ABNORMAL HIGH (ref 70–99)
Potassium: 4.2 mmol/L (ref 3.5–5.1)
Potassium: 4 mmol/L (ref 3.5–5.1)
Sodium: 134 mmol/L — ABNORMAL LOW (ref 135–145)
Sodium: 137 mmol/L (ref 135–145)

## 2022-04-20 LAB — BPAM PLATELET PHERESIS
Blood Product Expiration Date: 202305162359
ISSUE DATE / TIME: 202305151151
Unit Type and Rh: 5100

## 2022-04-20 LAB — GLUCOSE, CAPILLARY
Glucose-Capillary: 124 mg/dL — ABNORMAL HIGH (ref 70–99)
Glucose-Capillary: 124 mg/dL — ABNORMAL HIGH (ref 70–99)
Glucose-Capillary: 125 mg/dL — ABNORMAL HIGH (ref 70–99)
Glucose-Capillary: 127 mg/dL — ABNORMAL HIGH (ref 70–99)
Glucose-Capillary: 128 mg/dL — ABNORMAL HIGH (ref 70–99)
Glucose-Capillary: 132 mg/dL — ABNORMAL HIGH (ref 70–99)
Glucose-Capillary: 132 mg/dL — ABNORMAL HIGH (ref 70–99)
Glucose-Capillary: 135 mg/dL — ABNORMAL HIGH (ref 70–99)
Glucose-Capillary: 140 mg/dL — ABNORMAL HIGH (ref 70–99)
Glucose-Capillary: 172 mg/dL — ABNORMAL HIGH (ref 70–99)
Glucose-Capillary: 90 mg/dL (ref 70–99)

## 2022-04-20 LAB — CBC
HCT: 23.9 % — ABNORMAL LOW (ref 36.0–46.0)
HCT: 23.8 % — ABNORMAL LOW (ref 36.0–46.0)
Hemoglobin: 7.8 g/dL — ABNORMAL LOW (ref 12.0–15.0)
Hemoglobin: 7.8 g/dL — ABNORMAL LOW (ref 12.0–15.0)
MCH: 28.4 pg (ref 26.0–34.0)
MCH: 28.7 pg (ref 26.0–34.0)
MCHC: 32.6 g/dL (ref 30.0–36.0)
MCHC: 32.8 g/dL (ref 30.0–36.0)
MCV: 86.9 fL (ref 80.0–100.0)
MCV: 87.5 fL (ref 80.0–100.0)
Platelets: 127 10*3/uL — ABNORMAL LOW (ref 150–400)
Platelets: 119 10*3/uL — ABNORMAL LOW (ref 150–400)
RBC: 2.75 MIL/uL — ABNORMAL LOW (ref 3.87–5.11)
RBC: 2.72 MIL/uL — ABNORMAL LOW (ref 3.87–5.11)
RDW: 16.3 % — ABNORMAL HIGH (ref 11.5–15.5)
RDW: 16.4 % — ABNORMAL HIGH (ref 11.5–15.5)
WBC: 6.8 10*3/uL (ref 4.0–10.5)
WBC: 8.8 10*3/uL (ref 4.0–10.5)
nRBC: 0 % (ref 0.0–0.2)
nRBC: 0.2 % (ref 0.0–0.2)

## 2022-04-20 LAB — PREPARE PLATELET PHERESIS: Unit division: 0

## 2022-04-20 LAB — MAGNESIUM
Magnesium: 2.2 mg/dL (ref 1.7–2.4)
Magnesium: 1.9 mg/dL (ref 1.7–2.4)

## 2022-04-20 MED ORDER — INSULIN DETEMIR 100 UNIT/ML ~~LOC~~ SOLN
15.0000 [IU] | Freq: Two times a day (BID) | SUBCUTANEOUS | Status: DC
  Administered 2022-04-20 – 2022-04-22 (×4): 15 [IU] via SUBCUTANEOUS
  Filled 2022-04-20 (×6): qty 0.15

## 2022-04-20 MED ORDER — WARFARIN - PHYSICIAN DOSING INPATIENT: Freq: Every day | Status: DC

## 2022-04-20 MED ORDER — FUROSEMIDE 10 MG/ML IJ SOLN
40.0000 mg | Freq: Two times a day (BID) | INTRAMUSCULAR | Status: DC
Start: 2022-04-20 — End: 2022-04-22
  Administered 2022-04-20 – 2022-04-21 (×4): 40 mg via INTRAVENOUS
  Filled 2022-04-20 (×4): qty 4

## 2022-04-20 MED ORDER — INSULIN ASPART 100 UNIT/ML IJ SOLN
0.0000 [IU] | INTRAMUSCULAR | Status: DC
  Administered 2022-04-20: 2 [IU] via SUBCUTANEOUS
  Administered 2022-04-20: 4 [IU] via SUBCUTANEOUS
  Administered 2022-04-20 – 2022-04-21 (×2): 2 [IU] via SUBCUTANEOUS
  Administered 2022-04-21: 4 [IU] via SUBCUTANEOUS
  Administered 2022-04-21: 2 [IU] via SUBCUTANEOUS

## 2022-04-20 MED ORDER — METOCLOPRAMIDE HCL 5 MG/ML IJ SOLN
10.0000 mg | Freq: Four times a day (QID) | INTRAMUSCULAR | Status: AC
  Administered 2022-04-20 – 2022-04-21 (×4): 10 mg via INTRAVENOUS
  Filled 2022-04-20 (×4): qty 2

## 2022-04-20 MED ORDER — WARFARIN SODIUM 2.5 MG PO TABS
2.5000 mg | ORAL_TABLET | Freq: Every day | ORAL | Status: DC
  Administered 2022-04-20 – 2022-04-21 (×2): 2.5 mg via ORAL
  Filled 2022-04-20 (×2): qty 1

## 2022-04-20 MED ORDER — ASPIRIN 81 MG PO TBEC
81.0000 mg | DELAYED_RELEASE_TABLET | Freq: Every day | ORAL | Status: DC
Start: 2022-04-20 — End: 2022-04-25
  Administered 2022-04-20 – 2022-04-25 (×6): 81 mg via ORAL
  Filled 2022-04-20 (×7): qty 1

## 2022-04-20 MED ORDER — ENOXAPARIN SODIUM 40 MG/0.4ML IJ SOSY
40.0000 mg | PREFILLED_SYRINGE | Freq: Every day | INTRAMUSCULAR | Status: DC
  Administered 2022-04-20 – 2022-04-21 (×2): 40 mg via SUBCUTANEOUS
  Filled 2022-04-20 (×2): qty 0.4

## 2022-04-20 MED FILL — Mannitol IV Soln 20%: INTRAVENOUS | Qty: 500 | Status: AC

## 2022-04-20 MED FILL — Heparin Sodium (Porcine) Inj 1000 Unit/ML: INTRAMUSCULAR | Qty: 10 | Status: AC

## 2022-04-20 MED FILL — Electrolyte-R (PH 7.4) Solution: INTRAVENOUS | Qty: 3000 | Status: AC

## 2022-04-20 MED FILL — Lidocaine HCl Local Preservative Free (PF) Inj 2%: INTRAMUSCULAR | Qty: 15 | Status: AC

## 2022-04-20 MED FILL — Sodium Chloride IV Soln 0.9%: INTRAVENOUS | Qty: 2000 | Status: AC

## 2022-04-20 MED FILL — Sodium Bicarbonate IV Soln 8.4%: INTRAVENOUS | Qty: 50 | Status: AC

## 2022-04-20 NOTE — Progress Notes (Signed)
? ?   ?  [address] ?      [place] [address] ?            [phone]   ? ?  ? ?POD # 1  ? ?Stable day ? ?BP 122/78   Pulse 87   Temp 99.3 ?F (37.4 ?C)   Resp (!) 21   Ht '5\' 11"'$  (1.803 m)   Wt 100.6 kg   LMP 12/06/1976   SpO2 97%   BMI 30.93 kg/m?  ?2L [place] 99%  ? ? ?Intake/Output Summary (Last 24 hours) at 04/20/2022 1744 ?Last data filed at 04/20/2022 1700 ?Gross per 24 hour  ?Intake 1201.75 ml  ?Output 1745 ml  ?Net -543.25 ml  ? ?Creatinine up to 1.38- monitor ?Hgb stable at 7.8 ? ?Revonda Standard Roxan Hockey, MD ?[facility] ?[phone] ?

## 2022-04-20 NOTE — Op Note (Signed)
[name], [name] MEDICAL RECORD NO: [id number] ACCOUNT NO: [id number] DATE OF BIRTH: Oct 28, 1961 FACILITY: [facility] LOCATION: [facility] PHYSICIAN: Revonda Standard. Roxan Hockey, MD  Operative Report   DATE OF PROCEDURE: 04/19/2022  PREOPERATIVE DIAGNOSES:  Severe mitral and tricuspid regurgitation.  POSTOPERATIVE DIAGNOSES:  Severe mitral and tricuspid regurgitation.  PROCEDURE:   Median sternotomy, extracorporeal circulation.  Mitral valve repair with closure of cleft at A2-A3 and placement of 30 mm Edwards Physio II annuloplasty ring and  Tricuspid valve repair with 32 mm MC3 annuloplasty ring.  SURGEON:  Revonda Standard. Roxan Hockey, MD  ASSISTANT:  Lars Pinks, PA.  ANESTHESIA:  General.  FINDINGS:  Severe mitral and tricuspid regurgitation with preserved left ventricular systolic function prebypass. 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 Tricuspid valve with annular dilatation.  Postbypass transesophageal echocardiography showed no residual mitral regurgitation, gradient of 3 mmHg across the mitral valve. Mild tricuspid regurgitation with  Swan-Ganz catheter in place.  CLINICAL NOTE:  [name] is a 61 year old woman with a history of paroxysmal atrial fibrillation, diabetes, hypertension, hepatitis C and mitral regurgitation.  She presented with worsening shortness of breath.  She was found to have severe mitral  regurgitation and moderate to severe tricuspid regurgitation.  She did not have any coronary disease.  She was referred for mitral and tricuspid valve repair or replacement.  The indications, risks, benefits, and alternatives were discussed in detail  with the patient.  She understood and accepted the risks and agreed to proceed.  OPERATIVE NOTE:  [name] was brought to the preoperative holding area on 04/19/2022.  Anesthesia placed a Swan-Ganz catheter and an arterial blood pressure monitoring line.  She was taken to  the operating room and anesthetized and intubated.  Foley  catheter was placed.  Intravenous antibiotics were administered.  The chest, abdomen and legs were prepped and draped in the usual sterile fashion.  Dr. Renold Don of Anesthesia performed transesophageal echocardiography.  Please see his separately  dictated note for full details of the procedure.  There was severe mitral and tricuspid regurgitation with no significant prolapse.  A timeout was performed. A median sternotomy was performed.  Initial hemostasis was achieved and a sternal retractor was placed and was opened over time.  The pericardium was opened.  There was cardiomegaly.  The patient was fully heparinized.  After  confirming adequate anticoagulation with ACT measurement, the aorta was cannulated via concentric 2-0 Ethibond pledgeted pursestring sutures.  A 31-French right angle venous cannula was placed via pursestring suture in the superior vena cava.   Cardiopulmonary bypass was initiated.  A 36-French malleable cannula was placed via pursestring suture in the inferior aspect of the right atrium and directed into the inferior vena cava and was also connected to the bypass circuit.  Flows were  maintained per protocol and the patient was cooled to 32 degrees Celsius.  Vessel loops were placed around the cavae, but were only tightened during the open portion of the procedure.  Carbon dioxide was insufflated per protocol.  Retrograde cardioplegia  cannula was placed via a pursestring suture in the right atrium and directed into the coronary sinus.  A foam pad was placed in the pericardium to insulate the heart.  A temperature probe was placed in the myocardial septum and a cardioplegia cannula  was placed into the ascending aorta via a 4-0 Prolene pledgeted suture.  The aorta was crossclamped.  Cardiac arrest then was achieved with a combination of cold antegrade and retrograde KBC blood  cardioplegia and topical iced saline.  The  calculated dose was 1450 mL. The first 450 mL was given antegrade.  There was a  diastolic arrest.  The remainder of the initial dose was given via the retrograde catheter and there was septal cooling to 10 degrees Celsius.  The interatrial groove was dissected out.  A left atriotomy was performed. A retractor was placed.  There was annular dilatation.  There was mild posterior annular calcification.  There was some retraction of the A3 leaflet.  The remainder of the  anterior leaflet appeared relatively normal and with some slight thickening of the chordae tendineae.  The posterior leaflet appeared essentially normal with no prolapsing segments.  The anterior leaflet sized for a 30 mm annuloplasty ring.  2-0  Ethibond horizontal mattress sutures were placed in the annulus circumferentially.  A total of 16 sutures were placed.  The commissural sutures also measured for a 30 mm Physio II annuloplasty ring. Sutures were placed through the sewing ring of the  valve and it was lowered into place.  The sutures were tied using the Cor-Knot device.  The left ventricle was distended with iced saline and there was residual leak at the A3 area.  There was a cleft between the A3 leaflet and the remainder of the  anterior leaflet.  This was closed with 4-0 Ethibond figure-of-eight sutures.  One suture was also placed at the commissure between A3 and P3, and there was no residual mitral regurgitation.  The left atriotomy then was closed in 2 layers using a 4-0  Prolene suture.  The first layer was a horizontal mattress suture.  De-airing was performed prior to tying the suture and then the second layer was performed.  An additional dose of retrograde cardioplegia was given at 60 minutes from the initial dose  and then a warm reanimation dose of 400 mL of cardioplegia was administered retrograde and then the aortic crossclamp was removed.  The total crossclamp time was 86 minutes.  The patient spontaneously resumed sinus  rhythm and did not require  defibrillation.  The caval tapes remained tightened and the balloon was deflated on the retrograde catheter.  A right atriotomy was performed and the tricuspid valve was inspected.  The leaflets appeared normal.  There was annular dilatation. 2-0 Ethibond  horizontal mattress sutures were placed in the annular circumferentially.  The valve sized for a 32 mm MC3 annuloplasty ring.  The sutures were placed through the ring, which was lowered into place and again the sutures were secured with a Cor-Knot  device.  The right ventricle was distended with saline and there was only a trace of leakage around the Swan-Ganz catheter.  The right atriotomy then was closed again with 2 layers of 4-0 Prolene suture with de-airing after the first layer.  By this point, rewarming was complete.  Epicardial pacing wires were placed on the right ventricle and right atrium.  When the patient had rewarmed to a core temperature of 37 degrees Celsius, a trial wean was performed and there was no residual mitral  regurgitation.  There was trace to 1+ tricuspid regurgitation at the Swan-Ganz catheter. There was only a mild gradient across the mitral valve.  The patient was placed back on full bypass.  The inferior vena caval cannula was removed and an additional  suture was placed at that site.  The patient then was weaned from bypass.  She was atrial paced and on no inotropic support at the time of separation from  bypass.  The total bypass time was 151 minutes.  The initial cardiac index was 1.8 liters per  minute per meter squared.  Post-bypass transesophageal echocardiography findings were as described above.  A test dose of protamine was administered.  The superior vena caval cannula and aortic cannula were removed.  The remainder of the protamine was administered without  incident.  After ensuring there was no residual air, the aortic cardioplegia cannula/root vent was removed.  Additional sutures  were required at that site for hemostasis.  The remainder of the protamine was administered without incident.  The chest was  irrigated with warm saline.  Hemostasis was achieved.  The pericardium was reapproximated over the ascending aorta and base of the heart with interrupted 3-0 silk sutures.  It came together easily without tension.  A 36-French chest tube and 28-French  Blake drain were placed into the mediastinum.  The sternum was closed with a combination of single and double heavy gauge stainless steel wires.  There was a drop in cardiac output with chest closure. Heart rate and blood pressure and PA pressures all  remained relatively stable.  The patient did respond to volume resuscitation.  The pectoralis fascia, subcutaneous tissue and skin were closed in standard fashion.  All sponge, needle and instrument counts were correct at the end of the procedure.  The  patient was taken from the operating room to the surgical intensive care unit, intubated and in good condition.  Experienced assistance was necessary for this case due to surgical complexity.  Lars Pinks, PA assisted providing exposure, retraction of delicate tissues, suture management, and suctioning.   VAI D: 04/19/2022 5:38:39 pm T: 04/20/2022 1:53:00 am  JOB: [id number]/ [id number]

## 2022-04-20 NOTE — Progress Notes (Signed)
1 Day Post-Op Procedure(s) (LRB): ?MITRAL VALVE REPAIR WITH 30MM PHYSIO II ANNULOPLASTY RING (N/A) ?TRICUSPID VALVE REPAIR WITH 32MM MC3 ANNULOPLASTY RING (N/A) ?TRANSESOPHAGEAL ECHOCARDIOGRAM (TEE) (N/A) ?Subjective: ?Some incisional pain ? ?Objective: ?Vital signs in last 24 hours: ?Temp:  [96.4 ?F (35.8 ?C)-99.7 ?F (37.6 ?C)] 99.7 ?F (37.6 ?C) (05/16 0600) ?Pulse Rate:  [80-90] 89 (05/16 0600) ?Cardiac Rhythm: Atrial paced (05/15 1930) ?Resp:  [11-25] 23 (05/16 0600) ?BP: (121-142)/(73-94) 121/73 (05/15 1826) ?SpO2:  [100 %] 100 % (05/16 0600) ?Arterial Line BP: (91-143)/(52-87) 114/58 (05/16 0600) ?FiO2 (%):  [40 %-50 %] 40 % (05/15 1930) ?Weight:  [100.6 kg] 100.6 kg (05/16 0600) ? ?Hemodynamic parameters for last 24 hours: ?PAP: (24-39)/(13-20) 38/19 ?CO:  [2.5 L/min-4.5 L/min] 4.5 L/min ?CI:  [1.1 L/min/m2-2.1 L/min/m2] 2.1 L/min/m2 ? ?Intake/Output from previous day: ?05/15 0701 - 05/16 0700 ?In: 4135.1 [I.V.:1991.9; Blood:945; IV Piggyback:1198.2] ?Out: 1779 [TJQZE:0923; Blood:1290; Chest Tube:490] ?Intake/Output this shift: ?No intake/output data recorded. ? ?General appearance: alert, cooperative, and no distress ?Neurologic: intact ?Heart: regular rate and rhythm and paced ?Lungs: diminished breath sounds bibasilar ?Abdomen: mildly distended, nontender ? ?Lab Results: ?Recent Labs  ?  04/19/22 ?2010 04/19/22 ?2043 04/19/22 ?2202 04/20/22 ?3007  ?WBC 5.0  --   --  6.8  ?HGB 8.4*  8.5*   < > 8.5* 7.8*  ?HCT 25.8*  25.0*   < > 25.0* 23.9*  ?PLT 125*  --   --  127*  ? < > = values in this interval not displayed.  ? ?BMET:  ?Recent Labs  ?  04/19/22 ?2010 04/19/22 ?2043 04/19/22 ?2202 04/20/22 ?6226  ?NA 140  142   < > 142 134*  ?K 4.7  4.7   < > 4.5 4.2  ?CL 115*  --   --  110  ?CO2 20*  --   --  19*  ?GLUCOSE 108*  --   --  125*  ?BUN 14  --   --  12  ?CREATININE 1.00  --   --  1.11*  ?CALCIUM 8.1*  --   --  7.7*  ? < > = values in this interval not displayed.  ?  ?PT/INR:  ?Recent Labs  ?   04/19/22 ?1356  ?LABPROT 16.6*  ?INR 1.4*  ? ?ABG ?   ?Component Value Date/Time  ? PHART 7.319 (L) 04/19/2022 2202  ? HCO3 20.9 04/19/2022 2202  ? TCO2 22 04/19/2022 2202  ? ACIDBASEDEF 5.0 (H) 04/19/2022 2202  ? O2SAT 99 04/19/2022 2202  ? ?CBG (last 3)  ?Recent Labs  ?  04/20/22 ?0216 04/20/22 ?0313 04/20/22 ?0508  ?GLUCAP 127* 125* 128*  ? ? ?Assessment/Plan: ?S/P Procedure(s) (LRB): ?MITRAL VALVE REPAIR WITH 30MM PHYSIO II ANNULOPLASTY RING (N/A) ?TRICUSPID VALVE REPAIR WITH 32MM MC3 ANNULOPLASTY RING (N/A) ?TRANSESOPHAGEAL ECHOCARDIOGRAM (TEE) (N/A) ?POD # 1 ?NEURO- intact ?CV- bradycardic- paced for rate, stop metoprolol for now ? Good hemodynamics- dc Swan, A line ? ASA 81 mg + warfarin ?RESP- IS for atelectasis ?RENAL- creatinine and lytes OK< mildly acidotic ?ENDO- CBG well controlled with insulin drip ? Transition to levemir + SSI ?GI- Reglan x 24 hours, slowly advance diet as tolerated ?Anemia acute secondary to ABL on chronic- better after transfusion, monitor ?Thrombocytopenia- improved post transfusion, monitor ?DC chest tubes ?SCD + enoxaparin ?mobilize ? ? LOS: 1 day  ? ? ?Melrose Nakayama ?04/20/2022 ? ? ?

## 2022-04-20 NOTE — Addendum Note (Signed)
Addendum  created 04/20/22 0859 by Josephine Igo, CRNA  ? Order list changed, Pharmacy for encounter modified  ?  ?

## 2022-04-21 ENCOUNTER — Inpatient Hospital Stay (HOSPITAL_COMMUNITY): Payer: Medicare Other

## 2022-04-21 LAB — BASIC METABOLIC PANEL
Anion gap: 7 (ref 5–15)
BUN: 16 mg/dL (ref 8–23)
CO2: 23 mmol/L (ref 22–32)
Calcium: 8.1 mg/dL — ABNORMAL LOW (ref 8.9–10.3)
Chloride: 106 mmol/L (ref 98–111)
Creatinine, Ser: 1.26 mg/dL — ABNORMAL HIGH (ref 0.44–1.00)
GFR, Estimated: 48 mL/min — ABNORMAL LOW (ref >60)
Glucose, Bld: 106 mg/dL — ABNORMAL HIGH (ref 70–99)
Potassium: 4 mmol/L (ref 3.5–5.1)
Sodium: 136 mmol/L (ref 135–145)

## 2022-04-21 LAB — CBC
HCT: 21.5 % — ABNORMAL LOW (ref 36.0–46.0)
Hemoglobin: 7 g/dL — ABNORMAL LOW (ref 12.0–15.0)
MCH: 28.3 pg (ref 26.0–34.0)
MCHC: 32.6 g/dL (ref 30.0–36.0)
MCV: 87 fL (ref 80.0–100.0)
Platelets: 97 10*3/uL — ABNORMAL LOW (ref 150–400)
RBC: 2.47 MIL/uL — ABNORMAL LOW (ref 3.87–5.11)
RDW: 16.4 % — ABNORMAL HIGH (ref 11.5–15.5)
WBC: 8.2 10*3/uL (ref 4.0–10.5)
nRBC: 0 % (ref 0.0–0.2)

## 2022-04-21 LAB — GLUCOSE, CAPILLARY
Glucose-Capillary: 102 mg/dL — ABNORMAL HIGH (ref 70–99)
Glucose-Capillary: 117 mg/dL — ABNORMAL HIGH (ref 70–99)
Glucose-Capillary: 135 mg/dL — ABNORMAL HIGH (ref 70–99)
Glucose-Capillary: 139 mg/dL — ABNORMAL HIGH (ref 70–99)
Glucose-Capillary: 166 mg/dL — ABNORMAL HIGH (ref 70–99)
Glucose-Capillary: 92 mg/dL (ref 70–99)

## 2022-04-21 LAB — PROTIME-INR
INR: 1.3 — ABNORMAL HIGH (ref 0.8–1.2)
Prothrombin Time: 16.1 seconds — ABNORMAL HIGH (ref 11.4–15.2)

## 2022-04-21 MED ORDER — INSULIN ASPART 100 UNIT/ML IJ SOLN: 0.0000 [IU] | INTRAMUSCULAR | Status: DC

## 2022-04-21 MED ORDER — FE FUMARATE-B12-VIT C-FA-IFC PO CAPS
1.0000 | ORAL_CAPSULE | Freq: Two times a day (BID) | ORAL | Status: DC
  Administered 2022-04-21 – 2022-04-28 (×16): 1 via ORAL
  Filled 2022-04-21 (×16): qty 1

## 2022-04-21 MED ORDER — METOPROLOL TARTRATE 12.5 MG HALF TABLET
12.5000 mg | ORAL_TABLET | Freq: Two times a day (BID) | ORAL | Status: DC
  Administered 2022-04-21 – 2022-04-22 (×4): 12.5 mg via ORAL
  Filled 2022-04-21 (×4): qty 1

## 2022-04-21 MED ORDER — INSULIN ASPART 100 UNIT/ML IJ SOLN: 0.0000 [IU] | Freq: Three times a day (TID) | INTRAMUSCULAR | Status: DC

## 2022-04-21 NOTE — Progress Notes (Addendum)
[facility] DAILY ICU PROGRESS NOTE ? ?                 [address] ?           [place] [address] ?         [phone]  ? ?2 Days Post-Op ?Procedure(s) (LRB): ?MITRAL VALVE REPAIR WITH 30MM PHYSIO II ANNULOPLASTY RING (N/A) ?TRICUSPID VALVE REPAIR WITH 32MM MC3 ANNULOPLASTY RING (N/A) ?TRANSESOPHAGEAL ECHOCARDIOGRAM (TEE) (N/A) ? ?Total Length of Stay:  LOS: 2 days  ? ?Subjective: ?Up in the bedside chair after walking a full lap in the hall this morning.  Pain controlled. No new concerns.  ? ?Objective: ?Vital signs in last 24 hours: ?Temp:  [97.6 ?F (36.4 ?C)-100.3 ?F (37.9 ?C)] 97.6 ?F (36.4 ?C) (05/17 6160) ?Pulse Rate:  [84-90] 89 (05/17 0800) ?Cardiac Rhythm: Atrial paced (05/17 0400) ?Resp:  [18-29] 23 (05/17 0800) ?BP: (102-140)/(67-87) 131/74 (05/17 0800) ?SpO2:  [93 %-100 %] 97 % (05/17 0800) ?Arterial Line BP: (117-131)/(66-68) 131/68 (05/16 1000) ?Weight:  [100.1 kg] 100.1 kg (05/17 0600) ? ?Filed Weights  ? 04/19/22 0557 04/20/22 0600 04/21/22 0600  ?Weight: 96.2 kg 100.6 kg 100.1 kg  ? ? ?Weight change: -0.5 kg  ? ?Hemodynamic parameters for last 24 hours: ?PAP: (40)/(25) 40/25 ? ?Intake/Output from previous day: ?05/16 0701 - 05/17 0700 ?In: 627.1 [P.O.:360; I.V.:67.3; IV Piggyback:199.9] ?Out: 1515 [Urine:1365; Chest Tube:150] ? ?Intake/Output this shift: ?No intake/output data recorded. ? ?Current Meds: ?Scheduled Meds: ? sodium chloride   Intravenous Once  ? acetaminophen  1,000 mg Oral Q6H  ? Or  ? acetaminophen (TYLENOL) oral liquid 160 mg/5 mL  1,000 mg Per Tube Q6H  ? aspirin EC  81 mg Oral Daily  ? atorvastatin  20 mg Oral Daily  ? baclofen  10 mg Oral BID  ? bisacodyl  10 mg Oral Daily  ? Or  ? bisacodyl  10 mg Rectal Daily  ? buPROPion  300 mg Oral q AM  ? Chlorhexidine Gluconate Cloth  6 each Topical Daily  ? docusate sodium  200 mg Oral Daily  ? enoxaparin (LOVENOX) injection  40 mg Subcutaneous QHS  ? estrogens (conjugated)  0.45 mg Oral Daily  ? furosemide  40 mg  Intravenous BID  ? gabapentin  600 mg Oral TID  ? hydrOXYzine  25 mg Oral QID  ? insulin aspart  0-24 Units Subcutaneous Q4H  ? insulin detemir  15 Units Subcutaneous BID  ? methocarbamol  500 mg Oral QID  ? pantoprazole  40 mg Oral Daily  ? sodium chloride flush  10-40 mL Intracatheter Q12H  ? sodium chloride flush  3 mL Intravenous Q12H  ? warfarin  2.5 mg Oral q1600  ? Warfarin - Physician Dosing Inpatient   Does not apply q1600  ? ?Continuous Infusions: ? sodium chloride Stopped (04/20/22 1555)  ? sodium chloride Stopped (04/20/22 1056)  ? sodium chloride 10 mL/hr at 04/19/22 1416  ? dexmedetomidine (PRECEDEX) IV infusion 0.7 mcg/kg/hr (04/19/22 1600)  ? insulin Stopped (04/20/22 1206)  ? lactated ringers    ? lactated ringers    ? lactated ringers Stopped (04/19/22 1838)  ? nitroGLYCERIN Stopped (04/19/22 2213)  ? phenylephrine (NEO-SYNEPHRINE) Adult infusion Stopped (04/20/22 7371)  ? ?PRN Meds:.sodium chloride, albuterol, dextrose, lactated ringers, metoprolol tartrate, midazolam, morphine injection, ondansetron (ZOFRAN) IV, oxyCODONE, sodium chloride flush, sodium chloride flush, traMADol, traZODone ? ?General appearance: alert, cooperative, and no distress ?Neurologic: intact ?Heart: Pacer turned off this morning, SR  in the 70's-80. ?Lungs: breath sounds clear. CXR showing mild basilar ATX, lung fields o/w clear ?Abdomen: soft, non-tender ?Extremities: mild peripheral edema ?Wound: the sternotomy is covered with a dry Aquacel dressing.  ? ?Lab Results: ?CBC: ?Recent Labs  ?  04/20/22 ?1629 04/21/22 ?0500  ?WBC 8.8 8.2  ?HGB 7.8* 7.0*  ?HCT 23.8* 21.5*  ?PLT 119* 97*  ? ?BMET:  ?Recent Labs  ?  04/20/22 ?1629 04/21/22 ?0500  ?NA 137 136  ?K 4.0 4.0  ?CL 109 106  ?CO2 20* 23  ?GLUCOSE 137* 106*  ?BUN 14 16  ?CREATININE 1.38* 1.26*  ?CALCIUM 8.0* 8.1*  ?  ?CMET: ?Lab Results  ?Component Value Date  ? WBC 8.2 04/21/2022  ? HGB 7.0 (L) 04/21/2022  ? HCT 21.5 (L) 04/21/2022  ? PLT 97 (L) 04/21/2022  ? GLUCOSE 106  (H) 04/21/2022  ? CHOL 180 03/25/2016  ? TRIG 122 03/25/2016  ? HDL 39 (L) 03/25/2016  ? [place] 117 03/25/2016  ? ALT 14 04/15/2022  ? AST 22 04/15/2022  ? NA 136 04/21/2022  ? K 4.0 04/21/2022  ? CL 106 04/21/2022  ? CREATININE 1.26 (H) 04/21/2022  ? BUN 16 04/21/2022  ? CO2 23 04/21/2022  ? TSH 1.75 03/25/2016  ? INR 1.3 (H) 04/21/2022  ? HGBA1C 6.1 (H) 04/15/2022  ? MICROALBUR 0.50 06/19/2010  ? ? ? ? ?PT/INR:  ?Recent Labs  ?  04/21/22 ?0500  ?LABPROT 16.1*  ?INR 1.3*  ? ?Radiology: [facility] Chest Port 1 View ? ?Result Date: 04/21/2022 ?CLINICAL DATA:  Reason for exam: S/P MVR Patient denies any sob or chest pains. Hx of diabetes, htn. Hx of mitral and tricuspid valves repair 04/19/2022. EXAM: PORTABLE CHEST - 1 VIEW COMPARISON:  the previous day's study FINDINGS: Swan-Ganz catheter has been retracted leaving the IJ venous sheath in place. Removal of right chest drain. No pneumothorax. Relatively low lung volumes with left basilar consolidation/atelectasis slightly improved since previous. Mild opacities at the right lung base however have slightly increased. Heart size upper limits normal. The AP window is obscured as before. Blunting of lateral costophrenic angles suggesting small effusions. Sternotomy wires. IMPRESSION: Persistent low lung volumes with bibasilar opacities, left greater than right. Electronically Signed   By: Lucrezia Europe M.D.   On: 04/21/2022 06:43   ? ? ?Assessment/Plan: ?S/P Procedure(s) (LRB): ?MITRAL VALVE REPAIR WITH 30MM PHYSIO II ANNULOPLASTY RING (N/A) ?TRICUSPID VALVE REPAIR WITH 32MM MC3 ANNULOPLASTY RING (N/A) ?TRANSESOPHAGEAL ECHOCARDIOGRAM (TEE) (N/A) ? 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 VS and cardiac rhythm stable off all support. Progressing with mobility. Chest tubes and monitoring lines are out. Remove the Foley catheter today. Has h/o PAF and now in SR, may tolerate low-dose metoprolol now. INR 1.3, continue Coumadin 2.'5mg'$  daily. ? ?-HEME-Expected acute blood loss anemia- Hct  down to 21.5 this morning. Seems to be tolerating OK. Add iron supplement, will discuss transfusion.  ? ?-ENDO-Type 2 DM- A1C 6.1.  Good control on current regimen.  ? ?-Pulm- good sats on 2Lnc, working on [employer].  ? ?-DVT PPX- ambulate, continue daily enoxaparin.  ? ?-RENAL- normal function, Wt 4Kg+. Diurese. ? ?-H/O chronic pain syndrome- gabapentin and Wellbutrin have been resumed. ? ?Antony Odea, PA-C ?04/21/2022 8:14 AM ? ?

## 2022-04-21 NOTE — Progress Notes (Signed)
CARDIAC REHAB PHASE I  ? ?PRE:  Rate/Rhythm: 70 pacing ? ?  BP: sitting 128/70 ? ?  SaO2: 98 2L, 95 RA ? ?MODE:  Ambulation: 370 ft  ? ?POST:  Rate/Rhythm: 90 SR ? ?  BP: sitting 127/70  ? ?  SaO2: 95 RA ? ?Pt moved out of bed following sternal precautions. Stood and walked with eva walker, slow and steady.  Fatigue with distance, able to walk without O2. To w/c to transfer to 4e. Left O2 off, encouraged recliner on arrival to new room. Encouraged IS and another walk. ? ?Note pt needs PT/OT eval as she sts she does not have anyone at home that can stay with her at d/c and has been planning for SNF. ?[id number] ? ?Yves Dill CES, ACSM ?04/21/2022 ?2:29 PM ? ? ? ? ?

## 2022-04-21 NOTE — Plan of Care (Signed)
  Problem: Education: Goal: Knowledge of General Education information will improve Description Including pain rating scale, medication(s)/side effects and non-pharmacologic comfort measures Outcome: Progressing   

## 2022-04-21 NOTE — Discharge Instructions (Addendum)
Patient needs PT Jeannie Done on Friday 04/30/22. Call result to the [facility] at  [phone] for Coumadin dosing instructions.    Discharge Instructions:  1. You may shower, please wash incisions daily with soap and water and keep dry.  If you wish to cover wounds with dressing you may do so but please keep clean and change daily.  No tub baths or swimming until incisions have completely healed.  If your incisions become red or develop any drainage please call our office at [phone]  2. No Driving until cleared by Dr. Leonarda Salon office and you are no longer using narcotic pain medications  3. Monitor your weight daily.. Please use the same scale and weigh at same time... If you gain 5-10 lbs in 48 hours with associated lower extremity swelling, please contact our office at [phone]  4. Fever of 101.5 for at least 24 hours with no source, please contact our office at [phone]  5. Activity- up as tolerated, please walk at least 3 times per day.  Avoid strenuous activity, no lifting, pushing, or pulling with your arms over 8-10 lbs for a minimum of 6 weeks  6. If any questions or concerns arise, please do not hesitate to contact our office at [phone]   Information on my medicine - Coumadin   (Warfarin)  This medication education was reviewed with me or my healthcare representative as part of my discharge preparation.  The pharmacist that spoke with me during my hospital stay was:  Einar Grad, [facility]  Why was Coumadin prescribed for you? Coumadin was prescribed for you because you have a blood clot or a medical condition that can cause an increased risk of forming blood clots. Blood clots can cause serious health problems by blocking the flow of blood to the heart, lung, or brain. Coumadin can prevent harmful blood clots from forming. As a reminder your indication for Coumadin is:  Blood Clot Prevention after Heart Valve Surgery  What test will check on my response to  Coumadin? While on Coumadin (warfarin) you will need to have an INR test regularly to ensure that your dose is keeping you in the desired range. The INR (international normalized ratio) number is calculated from the result of the laboratory test called prothrombin time (PT).  If an INR APPOINTMENT HAS NOT ALREADY BEEN MADE FOR YOU please schedule an appointment to have this lab work done by your health care provider within 7 days. Your INR goal is usually a number between:  2 to 3 or your provider may give you a more narrow range like 2-2.5.  Ask your health care provider during an office visit what your goal INR is.  What  do you need to  know  About  COUMADIN? Take Coumadin (warfarin) exactly as prescribed by your healthcare provider about the same time each day.  DO NOT stop taking without talking to the doctor who prescribed the medication.  Stopping without other blood clot prevention medication to take the place of Coumadin may increase your risk of developing a new clot or stroke.  Get refills before you run out.  What do you do if you miss a dose? If you miss a dose, take it as soon as you remember on the same day then continue your regularly scheduled regimen the next day.  Do not take two doses of Coumadin at the same time.  Important Safety Information A possible side effect of Coumadin (Warfarin) is an increased risk of bleeding. You should  call your healthcare provider right away if you experience any of the following: Bleeding from an injury or your nose that does not stop. Unusual colored urine (red or dark brown) or unusual colored stools (red or black). Unusual bruising for unknown reasons. A serious fall or if you hit your head (even if there is no bleeding).  Some foods or medicines interact with Coumadin (warfarin) and might alter your response to warfarin. To help avoid this: Eat a balanced diet, maintaining a consistent amount of Vitamin K. Notify your provider about major  diet changes you plan to make. Avoid alcohol or limit your intake to 1 drink for women and 2 drinks for men per day. (1 drink is 5 oz. wine, 12 oz. beer, or 1.5 oz. liquor.)  Make sure that ANY health care provider who prescribes medication for you knows that you are taking Coumadin (warfarin).  Also make sure the healthcare provider who is monitoring your Coumadin knows when you have started a new medication including herbals and non-prescription products.  Coumadin (Warfarin)  Major Drug Interactions  Increased Warfarin Effect Decreased Warfarin Effect  Alcohol (large quantities) Antibiotics (esp. Septra/Bactrim, Flagyl, Cipro) Amiodarone (Cordarone) Aspirin (ASA) Cimetidine (Tagamet) Megestrol (Megace) NSAIDs (ibuprofen, naproxen, etc.) Piroxicam (Feldene) Propafenone (Rythmol SR) Propranolol (Inderal) Isoniazid (INH) Posaconazole (Noxafil) Barbiturates (Phenobarbital) Carbamazepine (Tegretol) Chlordiazepoxide (Librium) Cholestyramine (Questran) Griseofulvin Oral Contraceptives Rifampin Sucralfate (Carafate) Vitamin K   Coumadin (Warfarin) Major Herbal Interactions  Increased Warfarin Effect Decreased Warfarin Effect  Garlic Ginseng Ginkgo biloba Coenzyme Q10 Green tea St. John's wort    Coumadin (Warfarin) FOOD Interactions  Eat a consistent number of servings per week of foods HIGH in Vitamin K (1 serving =  cup)  Collards (cooked, or boiled & drained) Kale (cooked, or boiled & drained) Mustard greens (cooked, or boiled & drained) Parsley *serving size only =  cup Spinach (cooked, or boiled & drained) Swiss chard (cooked, or boiled & drained) Turnip greens (cooked, or boiled & drained)  Eat a consistent number of servings per week of foods MEDIUM-HIGH in Vitamin K (1 serving = 1 cup)  Asparagus (cooked, or boiled & drained) Broccoli (cooked, boiled & drained, or raw & chopped) Brussel sprouts (cooked, or boiled & drained) *serving size only =   cup Lettuce, raw (green leaf, endive, romaine) Spinach, raw Turnip greens, raw & chopped   These websites have more information on Coumadin (warfarin):  FailFactory.se; VeganReport.com.au;

## 2022-04-21 NOTE — Progress Notes (Signed)
Mobility Specialist Progress Note ? ? 04/21/22 1838  ?Mobility  ?Activity Ambulated with assistance in hallway;Ambulated with assistance to bathroom  ?Level of Assistance Moderate assist, patient does 50-74%  ?Assistive Device Front wheel walker  ?Distance Ambulated (ft) 126 ft  ?Activity Response Tolerated well  ?$Mobility charge 1 Mobility  ? ?Pre Mobility:  72 HR ?During Mobility: 109 HR ?Post Mobility: 76 HR, 105/76 BP ? ?Received pt in bed c/o of sternal incision pain(6/10) but agreeable. Requiring no physical assist for initial part of mobility. While out in hallway, pt got an immense urge to void and was stating that they couldn't hold it. Pt's LE then begin to buckle and a chair was quickly found, Pt tried to prematurely sit down and assistance was needed from a NT to get pt seated safely. Rolled back to BR where pt had a successful void. MinA to get pt up from BR and back to bed. Left supine w/ call bell in reach and NT in room.   ? ?Holland Falling ?Mobility Specialist ?Phone Number [phone] ? ?

## 2022-04-21 NOTE — Care Management (Signed)
?  Transition of Care (TOC) Screening Note ? ? ?Patient Details  ?Name: [name] ?Date of Birth: 07-13-1961 ? ? ?Transition of Care (TOC) CM/SW Contact:    ?Graves-Bigelow, Ocie Cornfield, RN ?Phone Number: ?04/21/2022, 12:56 PM ? ? ? ?Transition of Care Department [facility]) has reviewed patient and no TOC needs have been identified at this time. We will continue to monitor patient advancement through interdisciplinary progression rounds. Case Manager will continue to follow for additional transition of care needs as the patient progresses.  ?

## 2022-04-22 ENCOUNTER — Other Ambulatory Visit: Payer: Self-pay

## 2022-04-22 ENCOUNTER — Inpatient Hospital Stay (HOSPITAL_COMMUNITY): Payer: Medicare Other

## 2022-04-22 LAB — CBC
HCT: 20.3 % — ABNORMAL LOW (ref 36.0–46.0)
Hemoglobin: 6.7 g/dL — CL (ref 12.0–15.0)
MCH: 28.5 pg (ref 26.0–34.0)
MCHC: 33 g/dL (ref 30.0–36.0)
MCV: 86.4 fL (ref 80.0–100.0)
Platelets: 78 10*3/uL — ABNORMAL LOW (ref 150–400)
RBC: 2.35 MIL/uL — ABNORMAL LOW (ref 3.87–5.11)
RDW: 15.6 % — ABNORMAL HIGH (ref 11.5–15.5)
WBC: 7.4 10*3/uL (ref 4.0–10.5)
nRBC: 0.4 % — ABNORMAL HIGH (ref 0.0–0.2)

## 2022-04-22 LAB — GLUCOSE, CAPILLARY
Glucose-Capillary: 90 mg/dL (ref 70–99)
Glucose-Capillary: 103 mg/dL — ABNORMAL HIGH (ref 70–99)
Glucose-Capillary: 82 mg/dL (ref 70–99)
Glucose-Capillary: 110 mg/dL — ABNORMAL HIGH (ref 70–99)

## 2022-04-22 LAB — BASIC METABOLIC PANEL
Anion gap: 8 (ref 5–15)
BUN: 18 mg/dL (ref 8–23)
CO2: 24 mmol/L (ref 22–32)
Calcium: 8.1 mg/dL — ABNORMAL LOW (ref 8.9–10.3)
Chloride: 102 mmol/L (ref 98–111)
Creatinine, Ser: 1.23 mg/dL — ABNORMAL HIGH (ref 0.44–1.00)
GFR, Estimated: 50 mL/min — ABNORMAL LOW (ref >60)
Glucose, Bld: 87 mg/dL (ref 70–99)
Potassium: 3.9 mmol/L (ref 3.5–5.1)
Sodium: 134 mmol/L — ABNORMAL LOW (ref 135–145)

## 2022-04-22 LAB — PROTIME-INR
INR: 1.6 — ABNORMAL HIGH (ref 0.8–1.2)
Prothrombin Time: 18.4 seconds — ABNORMAL HIGH (ref 11.4–15.2)

## 2022-04-22 LAB — PREPARE RBC (CROSSMATCH)

## 2022-04-22 MED ORDER — ONDANSETRON HCL 4 MG PO TABS
4.0000 mg | ORAL_TABLET | Freq: Four times a day (QID) | ORAL | Status: DC | PRN
Start: 2022-04-22 — End: 2022-04-29

## 2022-04-22 MED ORDER — WARFARIN SODIUM 2 MG PO TABS
2.0000 mg | ORAL_TABLET | Freq: Every day | ORAL | Status: DC
Start: 2022-04-22 — End: 2022-04-23
  Administered 2022-04-22: 2 mg via ORAL
  Filled 2022-04-22: qty 1

## 2022-04-22 MED ORDER — DOCUSATE SODIUM 100 MG PO CAPS
200.0000 mg | ORAL_CAPSULE | Freq: Every day | ORAL | Status: DC
  Administered 2022-04-22 – 2022-04-28 (×7): 200 mg via ORAL
  Filled 2022-04-22 (×7): qty 2

## 2022-04-22 MED ORDER — FUROSEMIDE 40 MG PO TABS
40.0000 mg | ORAL_TABLET | Freq: Every day | ORAL | Status: DC
  Administered 2022-04-22 – 2022-04-28 (×7): 40 mg via ORAL
  Filled 2022-04-22 (×7): qty 1

## 2022-04-22 MED ORDER — POTASSIUM CHLORIDE CRYS ER 20 MEQ PO TBCR: 20.0000 meq | EXTENDED_RELEASE_TABLET | Freq: Two times a day (BID) | ORAL | Status: DC

## 2022-04-22 MED ORDER — ~~LOC~~ CARDIAC SURGERY, PATIENT & FAMILY EDUCATION: Freq: Once | Status: AC

## 2022-04-22 MED ORDER — INSULIN ASPART 100 UNIT/ML IJ SOLN
0.0000 [IU] | Freq: Three times a day (TID) | INTRAMUSCULAR | Status: DC
  Administered 2022-04-27: 2 [IU] via SUBCUTANEOUS
  Administered 2022-04-28: 3 [IU] via SUBCUTANEOUS
  Administered 2022-04-28: 2 [IU] via SUBCUTANEOUS

## 2022-04-22 MED ORDER — SODIUM CHLORIDE 0.9% FLUSH
3.0000 mL | Freq: Two times a day (BID) | INTRAVENOUS | Status: DC
  Administered 2022-04-22 – 2022-04-27 (×9): 3 mL via INTRAVENOUS

## 2022-04-22 MED ORDER — SODIUM CHLORIDE 0.9% FLUSH: 3.0000 mL | INTRAVENOUS | Status: DC | PRN

## 2022-04-22 MED ORDER — SODIUM CHLORIDE 0.9 % IV SOLN: 250.0000 mL | INTRAVENOUS | Status: DC | PRN

## 2022-04-22 MED ORDER — INSULIN DETEMIR 100 UNIT/ML ~~LOC~~ SOLN
15.0000 [IU] | Freq: Two times a day (BID) | SUBCUTANEOUS | Status: DC
  Administered 2022-04-22 (×2): 15 [IU] via SUBCUTANEOUS
  Filled 2022-04-22 (×4): qty 0.15

## 2022-04-22 MED ORDER — ACETAMINOPHEN 325 MG PO TABS
650.0000 mg | ORAL_TABLET | Freq: Four times a day (QID) | ORAL | Status: DC | PRN
  Administered 2022-04-22 – 2022-04-26 (×6): 650 mg via ORAL
  Filled 2022-04-22 (×8): qty 2

## 2022-04-22 MED ORDER — ONDANSETRON HCL 4 MG/2ML IJ SOLN: 4.0000 mg | Freq: Four times a day (QID) | INTRAMUSCULAR | Status: DC | PRN

## 2022-04-22 MED ORDER — POTASSIUM CHLORIDE CRYS ER 20 MEQ PO TBCR
20.0000 meq | EXTENDED_RELEASE_TABLET | Freq: Three times a day (TID) | ORAL | Status: DC
  Administered 2022-04-22 – 2022-04-24 (×7): 20 meq via ORAL
  Filled 2022-04-22 (×7): qty 1

## 2022-04-22 NOTE — Evaluation (Signed)
Physical Therapy Evaluation Patient Details Name: [name] MRN: [id number] DOB: December 23, 1960 Today's Date: 04/22/2022  History of Present Illness  Pt is a 61 y.o. female who presented 04/19/22 for TEE and mitral and tricuspid valve repair. PMH: paroxysmal atrial fibrillation, hepatitis C (treated), hypertension, type 2 diabetes complicated by neuropathy, fibromyalgia, arthritis, chronic neck pain, and chronic low back pain   Clinical Impression  Pt presents with condition above and deficits mentioned below, see PT Problem List. PTA, she was mod I intermittently using a rollator, living in a 1-level ground floor apartment. Pt is currently requiring minA for transfers and up to minA to ambulate household distances with a RW, displaying deficits in functional strength, balance, and activity tolerance that place her at risk for falls. Pt needs cues to maintain sternal precautions during the session. Pt does not have any support to assist her at home, thus recommending short-term rehab at a SNF to improve her independence and safety with mobility prior to return home. Will continue to follow acutely.       Recommendations for follow up therapy are one component of a multi-disciplinary discharge planning process, led by the attending physician.  Recommendations may be updated based on patient status, additional functional criteria and insurance authorization.  Follow Up Recommendations Skilled nursing-short term rehab (<3 hours/day)    Assistance Recommended at Discharge Frequent or constant Supervision/Assistance  Patient can return home with the following  A little help with walking and/or transfers;A little help with bathing/dressing/bathroom;Assistance with cooking/housework;Assist for transportation    Equipment Recommendations None recommended by PT  Recommendations for Other Services       Functional Status Assessment Patient has had a recent decline in their functional status and  demonstrates the ability to make significant improvements in function in a reasonable and predictable amount of time.     Precautions / Restrictions Precautions Precautions: Fall;Sternal Precaution Booklet Issued: No Precaution Comments: reviewed sternal precautions Restrictions Weight Bearing Restrictions: Yes Other Position/Activity Restrictions:  (sternal precautions)      Mobility  Bed Mobility Overal bed mobility: Needs Assistance Bed Mobility: Sidelying to Sit, Rolling, Sit to Sidelying Rolling: Min guard Sidelying to sit: Min assist, HOB elevated     Sit to sidelying: Min guard General bed mobility comments: Extra time and cues to roll onto side. MinA to manage legs off EOB as pt slowly ascended trunk. Min guard to return to supine with cues to lean onto elbow    Transfers Overall transfer level: Needs assistance Equipment used: Rolling walker (2 wheels) Transfers: Sit to/from Stand Sit to Stand: Min assist, From elevated surface           General transfer comment: Pt with posterior LOB returning to sit upon first attempt. MinA to transition weight anteriorly and steady with transfer to stand from elevated EOB    Ambulation/Gait Ambulation/Gait assistance: Min assist, Min guard Gait Distance (Feet): 90 Feet Assistive device: Rolling walker (2 wheels) Gait Pattern/deviations: Step-through pattern, Decreased stride length, Narrow base of support, Knee flexed in stance - right Gait velocity: reduced Gait velocity interpretation: <1.31 ft/sec, indicative of household ambulator   General Gait Details: Pt with slow, mildly unsteady gait with narrow BOS and R knee flexion during stance. Cues provided to widen BOS with noted slight improvement in stability, min guard-minA for safety  Stairs            Wheelchair Mobility    Modified Rankin (Stroke Patients Only)       Balance Overall balance  assessment: Needs assistance Sitting-balance support: No upper  extremity supported, Feet supported Sitting balance-Leahy Scale: Fair Sitting balance - Comments: Static sitting EOb with supervision for safety   Standing balance support: Bilateral upper extremity supported, During functional activity, Reliant on assistive device for balance Standing balance-Leahy Scale: Poor Standing balance comment: Reliant on RW and up to minA                             Pertinent Vitals/Pain Pain Assessment Pain Assessment: 0-10 Pain Score: 6  Pain Location: back, chest Pain Descriptors / Indicators: Discomfort, Grimacing, Operative site guarding Pain Intervention(s): Limited activity within patient's tolerance, Monitored during session, Repositioned    Home Living Family/patient expects to be discharged to:: Private residence Living Arrangements: Alone Available Help at Discharge:  (none) Type of Home: Apartment Home Access: Level entry       Home Layout: One level Home Equipment: Rollator (4 wheels);Cane - single point;BSC/3in1;Wheelchair - manual;Grab bars - tub/shower;Shower seat      Prior Function Prior Level of Function : Independent/Modified Independent             Mobility Comments: Was using rollator intermittently       Hand Dominance        Extremity/Trunk Assessment   Upper Extremity Assessment Upper Extremity Assessment: Defer to OT evaluation (limited by sternal precautions)    Lower Extremity Assessment Lower Extremity Assessment: Generalized weakness (noted functionally)    Cervical / Trunk Assessment Cervical / Trunk Assessment: Other exceptions Cervical / Trunk Exceptions: s/p sternotomy  Communication   Communication: No difficulties  Cognition Arousal/Alertness: Awake/alert Behavior During Therapy: WFL for tasks assessed/performed Overall Cognitive Status: Within Functional Limits for tasks assessed                                          General Comments      Exercises      Assessment/Plan    PT Assessment Patient needs continued PT services  PT Problem List Decreased strength;Decreased activity tolerance;Decreased balance;Decreased mobility;Decreased knowledge of precautions;Cardiopulmonary status limiting activity;Pain       PT Treatment Interventions DME instruction;Gait training;Functional mobility training;Therapeutic activities;Therapeutic exercise;Balance training;Neuromuscular re-education;Patient/family education    PT Goals (Current goals can be found in the Care Plan section)  Acute Rehab PT Goals Patient Stated Goal: to go to SNF for rehab PT Goal Formulation: With patient Time For Goal Achievement: 05/13/22 Potential to Achieve Goals: Good    Frequency Min 3X/week     Co-evaluation               AM-PAC PT "6 Clicks" Mobility  Outcome Measure Help needed turning from your back to your side while in a flat bed without using bedrails?: A Little Help needed moving from lying on your back to sitting on the side of a flat bed without using bedrails?: A Little Help needed moving to and from a bed to a chair (including a wheelchair)?: A Little Help needed standing up from a chair using your arms (e.g., wheelchair or bedside chair)?: A Little Help needed to walk in hospital room?: A Little Help needed climbing 3-5 steps with a railing? : A Lot 6 Click Score: 17    End of Session   Activity Tolerance: Patient tolerated treatment well Patient left: in bed;with call bell/phone within reach;with nursing/sitter in room  Nurse Communication: Mobility status PT Visit Diagnosis: Unsteadiness on feet (R26.81);Other abnormalities of gait and mobility (R26.89);Muscle weakness (generalized) (M62.81);Difficulty in walking, not elsewhere classified (R26.2);Pain Pain - Right/Left:  (back, chest) Pain - part of body:  (back, chest)    Time: 9509-3267 PT Time Calculation (min) (ACUTE ONLY): 16 min   Charges:   PT Evaluation $PT Eval Moderate  Complexity: 1 Mod          Moishe Spice, PT, DPT Acute Rehabilitation Services  Pager: [phone] Office: [phone]   Orvan Falconer 04/22/2022, 12:11 PM

## 2022-04-22 NOTE — Progress Notes (Signed)
[address]            [place], [address]          [phone]   3 Days Post-Op Procedure(s) (LRB): MITRAL VALVE REPAIR WITH 30MM PHYSIO II ANNULOPLASTY RING (N/A) TRICUSPID VALVE REPAIR WITH 32MM MC3 ANNULOPLASTY RING (N/A) TRANSESOPHAGEAL ECHOCARDIOGRAM (TEE) (N/A)  Total Length of Stay:  LOS: 3 days   Subjective: Transferred from ICU yesterday. Continues to progress with ambulation and was weaned to RA.  Appetite improving, BM last evening.   Objective: Vital signs in last 24 hours: Temp:  [98.7 F (37.1 C)-99.8 F (37.7 C)] 99.2 F (37.3 C) (05/18 0330) Pulse Rate:  [69-89] 72 (05/18 0330) Cardiac Rhythm: Normal sinus rhythm (05/17 1900) Resp:  [18-40] 20 (05/18 0330) BP: (104-158)/(66-91) 110/66 (05/18 0330) SpO2:  [91 %-99 %] 92 % (05/18 0330) Weight:  [99.1 kg] 99.1 kg (05/18 0330)  Filed Weights   04/20/22 0600 04/21/22 0600 04/22/22 0330  Weight: 100.6 kg 100.1 kg 99.1 kg    Weight change: -1.034 kg     Intake/Output from previous day: 05/17 0701 - 05/18 0700 In: 1887.3 [P.O.:1380; I.V.:507.3] Out: 1000 [Urine:1000]  Intake/Output this shift: No intake/output data recorded.  Current Meds: Scheduled Meds:  aspirin EC  81 mg Oral Daily   atorvastatin  20 mg Oral Daily   baclofen  10 mg Oral BID   buPROPion  300 mg Oral q AM   docusate sodium  200 mg Oral Daily   estrogens (conjugated)  0.45 mg Oral Daily   ferrous [id number] C-folic acid  1 capsule Oral BID PC   furosemide  40 mg Oral Daily   gabapentin  600 mg Oral TID   hydrOXYzine  25 mg Oral QID   insulin aspart  0-15 Units Subcutaneous TID WC   insulin detemir  15 Units Subcutaneous BID   methocarbamol  500 mg Oral QID   metoprolol tartrate  12.5 mg Oral BID   pantoprazole  40 mg Oral Daily   potassium chloride  20 mEq Oral TID   sodium chloride flush  3 mL Intravenous Q12H   warfarin  2.5 mg Oral q1600   Warfarin - Physician Dosing  Inpatient   Does not apply q1600   Continuous Infusions:  sodium chloride     PRN Meds:.sodium chloride, albuterol, ondansetron **OR** ondansetron (ZOFRAN) IV, oxyCODONE, sodium chloride flush, traMADol, traZODone  General appearance: alert, cooperative, and no distress Neurologic: intact Heart: stable SR with a few PVC's Lungs: breath sounds clear. CXR is stable showing mild basilar ATX, lung fields o/w clear Abdomen: soft, non-tender, has blood oozing from skin left lower abdomen, possibly from Lovenox or insulin injection site.  Extremities: mild peripheral edema Wound: the sternotomy is covered with a dry Aquacel dressing.   Lab Results: CBC: Recent Labs    04/21/22 0500 04/22/22 0557  WBC 8.2 7.4  HGB 7.0* 6.7*  HCT 21.5* 20.3*  PLT 97* 78*   BMET:  Recent Labs    04/21/22 0500 04/22/22 0557  NA 136 134*  K 4.0 3.9  CL 106 102  CO2 23 24  GLUCOSE 106* 87  BUN 16 18  CREATININE 1.26* 1.23*  CALCIUM 8.1* 8.1*    CMET: Lab Results  Component Value Date   WBC 7.4 04/22/2022   HGB 6.7 (LL) 04/22/2022   HCT 20.3 (L) 04/22/2022  PLT 78 (L) 04/22/2022   GLUCOSE 87 04/22/2022   CHOL 180 03/25/2016   TRIG 122 03/25/2016   HDL 39 (L) 03/25/2016   LDLCALC 117 03/25/2016   ALT 14 04/15/2022   AST 22 04/15/2022   NA 134 (L) 04/22/2022   K 3.9 04/22/2022   CL 102 04/22/2022   CREATININE 1.23 (H) 04/22/2022   BUN 18 04/22/2022   CO2 24 04/22/2022   TSH 1.75 03/25/2016   INR 1.6 (H) 04/22/2022   HGBA1C 6.1 (H) 04/15/2022   MICROALBUR 0.50 06/19/2010      PT/INR:  Recent Labs    04/22/22 0155  LABPROT 18.4*  INR 1.6*   Radiology: DG Chest 2 View  Result Date: 04/22/2022 CLINICAL DATA:  Postoperative check. EXAM: CHEST - 2 VIEW COMPARISON:  Portable chest yesterday at 5:25 a.m. FINDINGS: 4:28 a.m., 04/22/2022. The heart is enlarged. There is a stable mediastinal configuration. There are midline sternotomy sutures and prosthetic mitral and tricuspid  valves again shown. Interval removal of right IJ catheter introducer sheath. No pneumothorax is seen. Small pleural effusions are again noted. The central vessels remain normal in caliber. There is consolidation or atelectasis in the base of both lungs, unchanged. The mid and upper lungs remain clear. Overall aeration is unchanged. IMPRESSION: 1. Persistent basilar atelectasis or consolidation, greater on the left and small underlying pleural effusions. Overall aeration is unchanged. 2. Interval removal right IJ catheter introducer sheath. Electronically Signed   By: Telford Nab M.D.   On: 04/22/2022 05:46     Assessment/Plan: S/P Procedure(s) (LRB): MITRAL VALVE REPAIR WITH 30MM PHYSIO II ANNULOPLASTY RING (N/A) TRICUSPID VALVE REPAIR WITH 32MM MC3 ANNULOPLASTY RING (N/A) TRANSESOPHAGEAL ECHOCARDIOGRAM (TEE) (N/A)  -POD3 MV and TV repair for severe MR/TR. VS and cardiac rhythm stable. Progressing with mobility. INR 1.6, continue Coumadin 2.'5mg'$  daily, remove the pacer wires today.  -HEME-Expected acute blood loss anemia- Hct 20% this morning. Will  discuss transfusing. Continue iron supplement.  Plt count 78,000. Monitor.  -ENDO-Type 2 DM- A1C 6.1.  Good control on current regimen.   -Pulm- good sats on RA, working on pulm hygiene.   -DVT PPX- ambulate, continue daily enoxaparin.   -RENAL- normal function pre-op with small increase in Creat post-op now trending back down. Wt down 1kg from yesterday, still 3Kg+. Diurese.  -H/O chronic pain syndrome- gabapentin and Wellbutrin have been resumed and pain is controlled.  -Disposition- Patient does not have support at home and will need short-term SNF at discharge. Requested PT/OT/TOC consults.   Antony Odea, PA-C 04/22/2022 7:49 AM

## 2022-04-22 NOTE — Care Management Important Message (Signed)
Important Message  Patient Details  Name: [name] MRN: [id number] Date of Birth: Dec 14, 1960   Medicare Important Message Given:  Yes     [name] 04/22/2022, 8:15 AM

## 2022-04-22 NOTE — Progress Notes (Signed)
CARDIAC REHAB PHASE I   PRE:  Rate/Rhythm: 73 NSR  BP:  Sitting: 120/63      SaO2: 94 RA  MODE:  Ambulation: 100 ft   POST:  Rate/Rhythm: 80 NSR  BP:  Sitting: 133/88        SaO2: 96 RA   Seen pt from 1420-1451, upon arrival pt was in good spirits and was eager to walk the hallways. Pt was assisted x2 with walker . Approximately after ambulating 71f the patient felt dizzy and needed to sit down. When the pt was ready to get up she felt dizzy again and needed to sit down in a chair near the door. A BP was taken and was recorded at 148/83. After about 3 minutes, the pt got up and walked to the recliner with assistance. Pt said that she felt faint and was nearing syncope at the door. Pt was left in recliner and her nurse was notified.   JChristen Bame 2:51 PM 04/22/2022

## 2022-04-22 NOTE — Progress Notes (Signed)
Pt declined ambulation currently. Would like to rest/sleep as didn't rest last night. SaO2 88-92 RA in bed. Changed pulse ox probe. Yves Dill CES, ACSM 8:35 AM 04/22/2022

## 2022-04-23 ENCOUNTER — Other Ambulatory Visit: Payer: Self-pay | Admitting: Physician Assistant

## 2022-04-23 DIAGNOSIS — I340 Nonrheumatic mitral (valve) insufficiency: Secondary | ICD-10-CM

## 2022-04-23 LAB — TYPE AND SCREEN
ABO/RH(D): B POS
Antibody Screen: NEGATIVE
Unit division: 0
Unit division: 0
Unit division: 0
Unit division: 0

## 2022-04-23 LAB — BPAM RBC
Blood Product Expiration Date: 202305222359
Blood Product Expiration Date: 202305232359
Blood Product Expiration Date: 202305252359
Blood Product Expiration Date: 202305262359
ISSUE DATE / TIME: 202305150911
ISSUE DATE / TIME: 202305150911
ISSUE DATE / TIME: 202305181154
ISSUE DATE / TIME: 202305190601
Unit Type and Rh: 7300
Unit Type and Rh: 7300
Unit Type and Rh: 7300
Unit Type and Rh: 7300

## 2022-04-23 LAB — PREPARE PLATELET PHERESIS
Unit division: 0
Unit division: 0

## 2022-04-23 LAB — BPAM PLATELET PHERESIS
Blood Product Expiration Date: 202305192359
Blood Product Expiration Date: 202305202359
ISSUE DATE / TIME: 202305181426
ISSUE DATE / TIME: 202305181629
Unit Type and Rh: 5100
Unit Type and Rh: 6200

## 2022-04-23 LAB — CBC
HCT: 20.9 % — ABNORMAL LOW (ref 36.0–46.0)
Hemoglobin: 7.5 g/dL — ABNORMAL LOW (ref 12.0–15.0)
MCH: 29.6 pg (ref 26.0–34.0)
MCHC: 35.9 g/dL (ref 30.0–36.0)
MCV: 82.6 fL (ref 80.0–100.0)
Platelets: 101 10*3/uL — ABNORMAL LOW (ref 150–400)
RBC: 2.53 MIL/uL — ABNORMAL LOW (ref 3.87–5.11)
RDW: 14.8 % (ref 11.5–15.5)
WBC: 7 10*3/uL (ref 4.0–10.5)
nRBC: 0.4 % — ABNORMAL HIGH (ref 0.0–0.2)

## 2022-04-23 LAB — GLUCOSE, CAPILLARY
Glucose-Capillary: 113 mg/dL — ABNORMAL HIGH (ref 70–99)
Glucose-Capillary: 89 mg/dL (ref 70–99)
Glucose-Capillary: 101 mg/dL — ABNORMAL HIGH (ref 70–99)
Glucose-Capillary: 118 mg/dL — ABNORMAL HIGH (ref 70–99)

## 2022-04-23 LAB — BASIC METABOLIC PANEL
Anion gap: 8 (ref 5–15)
BUN: 17 mg/dL (ref 8–23)
CO2: 23 mmol/L (ref 22–32)
Calcium: 8.2 mg/dL — ABNORMAL LOW (ref 8.9–10.3)
Chloride: 104 mmol/L (ref 98–111)
Creatinine, Ser: 1.04 mg/dL — ABNORMAL HIGH (ref 0.44–1.00)
GFR, Estimated: >60 mL/min (ref >60)
Glucose, Bld: 100 mg/dL — ABNORMAL HIGH (ref 70–99)
Potassium: 4.7 mmol/L (ref 3.5–5.1)
Sodium: 135 mmol/L (ref 135–145)

## 2022-04-23 LAB — PROTIME-INR
INR: 1.8 — ABNORMAL HIGH (ref 0.8–1.2)
Prothrombin Time: 20.8 seconds — ABNORMAL HIGH (ref 11.4–15.2)

## 2022-04-23 MED ORDER — WARFARIN SODIUM 2.5 MG PO TABS
2.5000 mg | ORAL_TABLET | Freq: Every day | ORAL | Status: DC
  Administered 2022-04-23: 2.5 mg via ORAL
  Filled 2022-04-23: qty 1

## 2022-04-23 MED ORDER — LACTULOSE 10 GM/15ML PO SOLN
20.0000 g | Freq: Every day | ORAL | Status: DC | PRN
  Administered 2022-04-26: 20 g via ORAL
  Filled 2022-04-23: qty 30

## 2022-04-23 MED ORDER — OXYCODONE HCL 5 MG PO TABS
15.0000 mg | ORAL_TABLET | ORAL | Status: DC | PRN
  Administered 2022-04-23 – 2022-04-28 (×29): 15 mg via ORAL
  Filled 2022-04-23 (×29): qty 3

## 2022-04-23 MED ORDER — LINAGLIPTIN 5 MG PO TABS
5.0000 mg | ORAL_TABLET | Freq: Every morning | ORAL | Status: DC
Start: 2022-04-23 — End: 2022-04-29
  Administered 2022-04-23 – 2022-04-28 (×6): 5 mg via ORAL
  Filled 2022-04-23 (×6): qty 1

## 2022-04-23 MED ORDER — METOPROLOL TARTRATE 25 MG PO TABS
25.0000 mg | ORAL_TABLET | Freq: Two times a day (BID) | ORAL | Status: DC
Start: 2022-04-23 — End: 2022-04-29
  Administered 2022-04-23 – 2022-04-28 (×12): 25 mg via ORAL
  Filled 2022-04-23 (×12): qty 1

## 2022-04-23 MED ORDER — LOSARTAN POTASSIUM 50 MG PO TABS
100.0000 mg | ORAL_TABLET | Freq: Every morning | ORAL | Status: DC
  Administered 2022-04-23 – 2022-04-28 (×6): 100 mg via ORAL
  Filled 2022-04-23 (×6): qty 2

## 2022-04-23 MED FILL — Magnesium Sulfate Inj 50%: INTRAMUSCULAR | Qty: 10 | Status: AC

## 2022-04-23 MED FILL — Heparin Sodium (Porcine) Inj 1000 Unit/ML: INTRAMUSCULAR | Qty: 2500 | Status: AC

## 2022-04-23 MED FILL — Potassium Chloride Inj 2 mEq/ML: INTRAVENOUS | Qty: 40 | Status: AC

## 2022-04-23 MED FILL — Heparin Sodium (Porcine) Inj 1000 Unit/ML: Qty: 1000 | Status: AC

## 2022-04-23 NOTE — NC FL2 (Signed)
[place] LEVEL OF CARE SCREENING TOOL     IDENTIFICATION  Patient Name: [name] Birthdate: [birth year] Sex: female Admission Date (Current Location): 04/19/2022  [facility] and [place] Number:  Herbalist and Address:  [facility] [place], [place] [address] [place], [place] [address]      Provider Number: [id number]  Attending Physician Name and Address:  Melrose Nakayama, MD  Relative Name and Phone Number:       Current Level of Care: Hospital Recommended Level of Care: [place] Prior Approval Number:    Date Approved/Denied:   PASRR Number: [id number]  Discharge Plan: SNF    Current Diagnoses: Patient Active Problem List   Diagnosis Date Noted   Hypotension 04/05/2022   Severe tricuspid regurgitation 04/05/2022   Severe mitral regurgitation 02/11/2022   DOE (dyspnea on exertion) 12/30/2021   Pain of left hand 01/01/2021   Nasal vestibulitis 04/25/2020   FH: CHF (congestive heart failure) 04/05/2020   Acquired hallux interphalangeus 02/26/2020   Hallux rigidus 02/26/2020   Retained orthopedic hardware 02/26/2020   Mechanical complication associated with orthopedic device ([place]) 08/31/2019   Hypercholesterolemia 08/17/2019   Lateral epicondylitis of right elbow 03/31/2018   Arthritis of knee 03/09/2018   Arthritis of foot, degenerative 02/28/2018   Hallux valgus (acquired), left foot 02/28/2018   Long term current use of oral hypoglycemic drug 01/16/2018   Pharyngeal dysphagia 12/21/2017   Left thyroid nodule 12/21/2017   History of colon polyps 09/21/2017   History of atrial fibrillation 08/23/2017   GERD (gastroesophageal reflux disease) 08/23/2017   Presbyopia of both eyes 07/15/2017   Nuclear sclerotic cataract of both eyes 07/15/2017   Dry eye syndrome of both lacrimal glands 07/15/2017   Acquired trigger finger of left ring finger 05/04/2017   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  01/17/2017   Postural dizziness with presyncope 12/30/2016   Paroxysmal atrial fibrillation ([place]) 12/30/2016   Osteoarthritis 12/30/2016   NSAID long-term use 12/30/2016   Anemia 12/30/2016   DDD (degenerative disc disease), lumbar 11/26/2015   Facet syndrome, lumbar 11/26/2015   Greater trochanteric bursitis 11/26/2015   Diabetic neuropathy ([place]) 11/26/2015   Insomnia 11/11/2015   PTSD (post-traumatic stress disorder) 11/11/2015   GAD (generalized anxiety disorder) 11/11/2015   Severe episode of recurrent major depressive disorder, without psychotic features ([place]) 11/11/2015   Depression 11/11/2015   Obesity 06/27/2012   Tobacco use 06/27/2012   Sacroiliac joint dysfunction 05/18/2012   Right low back pain 04/19/2012   Cervical neck pain with evidence of disc disease 02/11/2012   Trochanteric bursitis of right hip 02/11/2012   Plantar fasciitis 03/11/2011   INSOMNIA 01/18/2008   Chronic pain syndrome 12/15/2007   DIABETIC PERIPHERAL NEUROPATHY 05/08/2007   HEPATITIS C 02/27/2007   DIABETES MELLITUS, TYPE II 02/27/2007   DEPRESSION 02/27/2007   Hypertension associated with diabetes ([place]) 02/27/2007    Orientation RESPIRATION BLADDER Height & Weight     Self, Time, Situation, Place  Normal Continent Weight: 212 lb 11.9 oz (96.5 kg) Height:  '5\' 11"'$  (180.3 cm)  BEHAVIORAL SYMPTOMS/MOOD NEUROLOGICAL BOWEL NUTRITION STATUS      Continent Diet (please see discharge summary)  AMBULATORY STATUS COMMUNICATION OF NEEDS Skin   Supervision Verbally Surgical wounds (closed incision , chest)                       Personal Care Assistance Level of Assistance  Bathing, Feeding, Dressing Bathing Assistance: Limited assistance Feeding assistance:  Independent Dressing Assistance: Limited assistance     Functional Limitations Info  Sight, Hearing, Speech Sight Info: Adequate Hearing Info: Adequate Speech Info: Adequate    SPECIAL CARE FACTORS FREQUENCY  PT (By licensed PT),  OT (By licensed OT)     PT Frequency: 5x per wek OT Frequency: 5x per wek            Contractures Contractures Info: Not present    Additional Factors Info  Code Status, Allergies Code Status Info: FULL Allergies Info: Hydrocodone,acetaminophen,           Current Medications (04/23/2022):  This is the current hospital active medication list Current Facility-Administered Medications  Medication Dose Route Frequency Provider Last Rate Last Admin   0.9 %  sodium chloride infusion  250 mL Intravenous PRN Roddenberry, Myron G, PA-C       acetaminophen (TYLENOL) tablet 650 mg  650 mg Oral Q6H PRN Antony Odea, PA-C   650 mg at 04/22/22 2202   albuterol (PROVENTIL) (2.5 MG/3ML) 0.083% nebulizer solution 2.5 mg  2.5 mg Nebulization Q6H PRN Roddenberry, Myron G, PA-C       aspirin EC tablet 81 mg  81 mg Oral Daily Roddenberry, Myron G, PA-C   81 mg at 04/23/22 0847   atorvastatin (LIPITOR) tablet 20 mg  20 mg Oral Daily Roddenberry, Myron G, PA-C   20 mg at 04/23/22 0846   baclofen (LIORESAL) tablet 10 mg  10 mg Oral BID Roddenberry, Myron G, PA-C   10 mg at 04/23/22 0847   buPROPion (WELLBUTRIN XL) 24 hr tablet 300 mg  300 mg Oral q AM [place] Cutter G, PA-C   300 mg at 04/23/22 3646   docusate sodium (COLACE) capsule 200 mg  200 mg Oral Daily Antony Odea, PA-C   200 mg at 04/23/22 8032   estrogens (conjugated) (PREMARIN) tablet 0.45 mg  0.45 mg Oral Daily Roddenberry, Myron G, PA-C   0.45 mg at 04/23/22 1243   ferrous [id number] C-folic acid (TRINSICON / FOLTRIN) capsule 1 capsule  1 capsule Oral BID PC Roddenberry, Myron G, PA-C   1 capsule at 04/23/22 0846   furosemide (LASIX) tablet 40 mg  40 mg Oral Daily Antony Odea, PA-C   40 mg at 04/23/22 0847   gabapentin (NEURONTIN) tablet 600 mg  600 mg Oral TID Roddenberry, Myron G, PA-C   600 mg at 04/23/22 0847   hydrOXYzine (ATARAX) tablet 25 mg  25 mg Oral QID Roddenberry, Myron G, PA-C   25 mg at  04/23/22 1243   insulin aspart (novoLOG) injection 0-15 Units  0-15 Units Subcutaneous TID WC Roddenberry, Myron G, PA-C       linagliptin (TRADJENTA) tablet 5 mg  5 mg Oral q AM Melrose Nakayama, MD   5 mg at 04/23/22 0847   losartan (COZAAR) tablet 100 mg  100 mg Oral q AM Melrose Nakayama, MD   100 mg at 04/23/22 0846   methocarbamol (ROBAXIN) tablet 500 mg  500 mg Oral QID Roddenberry, Myron G, PA-C   500 mg at 04/23/22 1243   metoprolol tartrate (LOPRESSOR) tablet 25 mg  25 mg Oral BID Roddenberry, Myron G, PA-C   25 mg at 04/23/22 0847   ondansetron (ZOFRAN) tablet 4 mg  4 mg Oral Q6H PRN Roddenberry, Myron G, PA-C       Or   ondansetron (ZOFRAN) injection 4 mg  4 mg Intravenous Q6H PRN Antony Odea, PA-C  oxyCODONE (Oxy IR/ROXICODONE) immediate release tablet 5-10 mg  5-10 mg Oral Q3H PRN Antony Odea, PA-C   10 mg at 04/23/22 1243   pantoprazole (PROTONIX) EC tablet 40 mg  40 mg Oral Daily Antony Odea, PA-C   40 mg at 04/23/22 0846   potassium chloride SA (KLOR-CON M) CR tablet 20 mEq  20 mEq Oral TID Roddenberry, Myron G, PA-C   20 mEq at 04/23/22 0847   sodium chloride flush (NS) 0.9 % injection 3 mL  3 mL Intravenous Q12H Antony Odea, PA-C   3 mL at 04/23/22 0849   sodium chloride flush (NS) 0.9 % injection 3 mL  3 mL Intravenous PRN Roddenberry, Myron G, PA-C       traMADol Veatrice Bourbon) tablet 50-100 mg  50-100 mg Oral Q4H PRN Roddenberry, Myron G, PA-C   100 mg at 04/23/22 1106   traZODone (DESYREL) tablet 250 mg  250 mg Oral QHS PRN Antony Odea, PA-C   250 mg at 04/22/22 2202   warfarin (COUMADIN) tablet 2.5 mg  2.5 mg Oral q1600 Antony Odea, PA-C       Warfarin - Physician Dosing Inpatient   Does not apply q1600 Antony Odea, PA-C   Given at 04/22/22 1517     Discharge Medications: Please see discharge summary for a list of discharge medications.  Relevant Imaging Results:  Relevant Lab  Results:   Additional Information SSN [id number]  Vinie Sill, LCSW

## 2022-04-23 NOTE — Evaluation (Signed)
Occupational Therapy Evaluation Patient Details Name: [name] MRN: [id number] DOB: 10-Aug-1961 Today's Date: 04/23/2022   History of Present Illness Pt is a 61 y.o. female who presented 04/19/22 for TEE and mitral and tricuspid valve repair. PMH: paroxysmal atrial fibrillation, hepatitis C (treated), hypertension, type 2 diabetes complicated by neuropathy, fibromyalgia, arthritis, chronic neck pain, and chronic low back pain   Clinical Impression   Pt admitted with the above diagnosis and has the deficits listed below. Pt would benefit from cont OT to increase independence with basic adls to a mod I level so she can eventually return home to live alone after a short rehab stay.  Pt lives alone in an apartment and is unsteady on her feet during all adls making pt a fall risk to return home now.  Will focus therapy on home skills, LE adls and adapting to sternal precautions while completing adls.  Rec SNF due to pt living alone.       Recommendations for follow up therapy are one component of a multi-disciplinary discharge planning process, led by the attending physician.  Recommendations may be updated based on patient status, additional functional criteria and insurance authorization.   Follow Up Recommendations  Skilled nursing-short term rehab (<3 hours/day)    Assistance Recommended at Discharge Frequent or constant Supervision/Assistance  Patient can return home with the following A little help with walking and/or transfers;A little help with bathing/dressing/bathroom;Assistance with cooking/housework;Assist for transportation    Functional Status Assessment  Patient has had a recent decline in their functional status and demonstrates the ability to make significant improvements in function in a reasonable and predictable amount of time.  Equipment Recommendations  None recommended by OT    Recommendations for Other Services       Precautions / Restrictions  Precautions Precautions: Fall;Sternal Precaution Booklet Issued: No Precaution Comments: reviewed sternal precautions Restrictions Weight Bearing Restrictions: Yes Other Position/Activity Restrictions: sternal precautions      Mobility Bed Mobility Overal bed mobility: Needs Assistance Bed Mobility: Sidelying to Sit, Rolling, Sit to Sidelying Rolling: Min guard Sidelying to sit: Min assist, HOB elevated     Sit to sidelying: Min guard General bed mobility comments: Cues for sternal precautions.    Transfers Overall transfer level: Needs assistance Equipment used: Rolling walker (2 wheels) Transfers: Sit to/from Stand Sit to Stand: Min assist, From elevated surface           General transfer comment: Pt with posterior LOB returning to sit upon first attempt. MinA to transition weight anteriorly and steady with transfer to stand from elevated EOB. Cues to take nose over toes was helpful.      Balance Overall balance assessment: Needs assistance Sitting-balance support: No upper extremity supported, Feet supported Sitting balance-Leahy Scale: Fair Sitting balance - Comments: Static sitting EOb with supervision for safety Postural control: Posterior lean Standing balance support: Bilateral upper extremity supported, During functional activity, Reliant on assistive device for balance Standing balance-Leahy Scale: Poor Standing balance comment: Reliant on RW and up to minA                           ADL either performed or assessed with clinical judgement   ADL Overall ADL's : Needs assistance/impaired Eating/Feeding: Set up;Sitting   Grooming: Wash/dry hands;Wash/dry face;Oral care;Min guard;Standing Grooming Details (indicate cue type and reason): Pt groomed at sink with min guard only for balance.  One small LOB, pt recovered.  Pt stated she did not  feel like balance was very good this morning. Upper Body Bathing: Set up;Sitting   Lower Body Bathing:  Moderate assistance;Sit to/from stand;Cueing for compensatory techniques Lower Body Bathing Details (indicate cue type and reason): cuing to not reach down toward feet but to bring feet to her in figure 4 position. Cued to turn sideways on bed to reach feet. Upper Body Dressing : Minimal assistance   Lower Body Dressing: Moderate assistance;Cueing for compensatory techniques;Sit to/from stand   Toilet Transfer: Minimal assistance;Ambulation;BSC/3in1;Regular Toilet;Rolling walker (2 wheels) Toilet Transfer Details (indicate cue type and reason): Pt walked to bathroom with min assist. Toileting- Clothing Manipulation and Hygiene: Minimal assistance;Sit to/from stand;Cueing for compensatory techniques       Functional mobility during ADLs: Minimal assistance;Rolling walker (2 wheels) General ADL Comments: Pt limited most by decreased balance in standing during adls and pain.     Vision Baseline Vision/History: 0 No visual deficits Ability to See in Adequate Light: 0 Adequate Patient Visual Report: No change from baseline Vision Assessment?: No apparent visual deficits     Perception     Praxis      Pertinent Vitals/Pain Pain Assessment Pain Assessment: Faces Faces Pain Scale: Hurts even more Pain Location: back, chest Pain Descriptors / Indicators: Discomfort, Grimacing, Operative site guarding Pain Intervention(s): Limited activity within patient's tolerance, Monitored during session, Patient requesting pain meds-RN notified, Repositioned, RN gave pain meds during session     Hand Dominance Right   Extremity/Trunk Assessment Upper Extremity Assessment Upper Extremity Assessment: Overall WFL for tasks assessed   Lower Extremity Assessment Lower Extremity Assessment: Defer to PT evaluation   Cervical / Trunk Assessment Cervical / Trunk Assessment: Other exceptions Cervical / Trunk Exceptions: s/p sternotomy   Communication Communication Communication: No difficulties    Cognition Arousal/Alertness: Awake/alert Behavior During Therapy: WFL for tasks assessed/performed Overall Cognitive Status: Within Functional Limits for tasks assessed                                       General Comments  Pt unsteady on feet when up and experiencing pain. Pt commented about being surprised how much she is expected to do so early. Spoke with pt at length about why it is so important to walk, sit up and mobilize after a surgery such as this one.    Exercises     Shoulder Instructions      Home Living Family/patient expects to be discharged to:: Private residence Living Arrangements: Alone Available Help at Discharge: Available PRN/intermittently Type of Home: Apartment Home Access: Level entry     Home Layout: One level     Bathroom Shower/Tub: Teacher, early years/pre: Standard     Home Equipment: Rollator (4 wheels);Cane - single point;BSC/3in1;Wheelchair - manual;Grab bars - tub/shower;Shower seat          Prior Functioning/Environment Prior Level of Function : Independent/Modified Independent             Mobility Comments: Was using rollator intermittently          OT Problem List: Decreased range of motion;Impaired balance (sitting and/or standing);Decreased knowledge of use of DME or AE;Decreased knowledge of precautions;Pain      OT Treatment/Interventions: Self-care/ADL training;DME and/or AE instruction;Therapeutic activities;Balance training    OT Goals(Current goals can be found in the care plan section) Acute Rehab OT Goals Patient Stated Goal: to get stronger before returning home OT Goal  Formulation: With patient Time For Goal Achievement: 05/06/22 Potential to Achieve Goals: Good ADL Goals Pt Will Perform Grooming: with modified independence;standing Pt Will Perform Lower Body Bathing: with modified independence;sit to/from stand Pt Will Perform Lower Body Dressing: with modified independence;sit  to/from stand Pt Will Perform Tub/Shower Transfer: Tub transfer;with supervision;rolling walker;shower seat;ambulating Additional ADL Goal #1: Pt will walk to bathroom with rollator and complete all toileting on 3:1 over commode with mod I.  OT Frequency: Min 2X/week    Co-evaluation              AM-PAC OT "6 Clicks" Daily Activity     Outcome Measure Help from another person eating meals?: None Help from another person taking care of personal grooming?: None Help from another person toileting, which includes using toliet, bedpan, or urinal?: A Little Help from another person bathing (including washing, rinsing, drying)?: A Lot Help from another person to put on and taking off regular upper body clothing?: A Little Help from another person to put on and taking off regular lower body clothing?: A Lot 6 Click Score: 18   End of Session Equipment Utilized During Treatment: Rolling walker (2 wheels) Nurse Communication: Mobility status  Activity Tolerance: Patient tolerated treatment well Patient left: in bed;with call bell/phone within reach;with bed alarm set;with nursing/sitter in room  OT Visit Diagnosis: Unsteadiness on feet (R26.81)                Time: 3220-2542 OT Time Calculation (min): 29 min Charges:  OT General Charges $OT Visit: 1 Visit OT Evaluation $OT Eval Moderate Complexity: 1 Mod OT Treatments $Self Care/Home Management : 8-22 mins  Glenford Peers 04/23/2022, 9:13 AM

## 2022-04-23 NOTE — Progress Notes (Signed)
CARDIAC REHAB PHASE I   PRE:  Rate/Rhythm: 70 NSR  BP:  Sitting: 133/74  Standing: 119/76      SaO2: 91 RA  MODE:  Ambulation:  ft   POST:  Rate/Rhythm: 78 NSR  BP:  Sitting: 125/78      SaO2: 97 RA  Seen pt from 1216-1306, pt in good spirits was eager to be ambulated. As a precaution BP was taken standing which was recorded at 119/76. She felt stronger than yesterday so we ambulated with a rollator with 1 x support with gait belt. She walked 30 ft before stopping for a break due to knee buckling, she stood up with moderate assistance and walked another 64f before taking another break due to fatigue. After getting up again with moderate assistance she walked back to the room in her chair. There were no reported symptoms of dizziness or near-syncope. Pt was encouraged to continue use of IS.  JChristen Bame 12:51 PM 04/23/2022

## 2022-04-23 NOTE — TOC Initial Note (Signed)
Transition of Care [facility]) - Initial/Assessment Note    Patient Details  Name: [name] MRN: [id number] Date of Birth: 05-Aug-1961  [facility] of [facility]) CM/SW Contact:    Vinie Sill, LCSW Phone Number: 04/23/2022, 3:04 PM  Clinical Narrative:                  CSW met with patient. CSW introduced self and explained role. CSW discussed with patient therapy recommendation of short term rehab at [facility]. Patient states she lives home alone and is agreeable to short term rehab at [facility]. CSW explained the SNF process. Patient is interested in [place]?? But she was not sure of the SNF name. She was agreeable to sending out referrals in the area.   TOC will provide bed offers once available  TOC will continue to follow and assist with discharge planning.   Thurmond Butts, MSW, LCSW Clinical Social Worker    Expected Discharge Plan: Skilled Nursing Facility Barriers to Discharge: Continued Medical Work up, Ship broker, SNF Pending bed offer   Patient Goals and CMS Choice        Expected Discharge Plan and Services Expected Discharge Plan: [place] In-house Referral: Clinical Social Work     Living arrangements for the past 2 months: Single Family Home                                      Prior Living Arrangements/Services Living arrangements for the past 2 months: Single Family Home Lives with:: Self Patient language and need for interpreter reviewed:: No        Need for Family Participation in Patient Care: No (Comment) Care giver support system in place?: No (comment)   Criminal Activity/Legal Involvement Pertinent to Current Situation/Hospitalization: No - Comment as needed  Activities of Daily Living Home Assistive Devices/Equipment: Cane (specify quad or straight), Blood pressure cuff, Eyeglasses, Dentures (specify type), Walker (specify type), Wheelchair ADL Screening (condition at time of admission) Patient's  cognitive ability adequate to safely complete daily activities?: Yes Is the patient deaf or have difficulty hearing?: No Does the patient have difficulty seeing, even when wearing glasses/contacts?: No Does the patient have difficulty concentrating, remembering, or making decisions?: No Patient able to express need for assistance with ADLs?: Yes Does the patient have difficulty dressing or bathing?: No Independently performs ADLs?: Yes (appropriate for developmental age) Does the patient have difficulty walking or climbing stairs?: No Weakness of Legs: Both Weakness of Arms/Hands: Both  Permission Sought/Granted   Permission granted to share information with : No (patient does NOT want information shared w/family)              Emotional Assessment Appearance:: Appears stated age Attitude/Demeanor/Rapport: Self-Confident, Engaged Affect (typically observed): Accepting, Pleasant, Appropriate Orientation: : Oriented to Self, Oriented to Place, Oriented to  Time, Oriented to Situation Alcohol / Substance Use: Not Applicable Psych Involvement: No (comment)  Admission diagnosis:  Severe mitral regurgitation [I34.0] Patient Active Problem List   Diagnosis Date Noted   Hypotension 04/05/2022   Severe tricuspid regurgitation 04/05/2022   Severe mitral regurgitation 02/11/2022   DOE (dyspnea on exertion) 12/30/2021   Pain of left hand 01/01/2021   Nasal vestibulitis 04/25/2020   FH: CHF (congestive heart failure) 04/05/2020   Acquired hallux interphalangeus 02/26/2020   Hallux rigidus 02/26/2020   Retained orthopedic hardware 02/26/2020   Mechanical complication associated with orthopedic device ([place]) 08/31/2019  Hypercholesterolemia 08/17/2019   Lateral epicondylitis of right elbow 03/31/2018   Arthritis of knee 03/09/2018   Arthritis of foot, degenerative 02/28/2018   Hallux valgus (acquired), left foot 02/28/2018   Long term current use of oral hypoglycemic drug 01/16/2018    Pharyngeal dysphagia 12/21/2017   Left thyroid nodule 12/21/2017   History of colon polyps 09/21/2017   History of atrial fibrillation 08/23/2017   GERD (gastroesophageal reflux disease) 08/23/2017   Presbyopia of both eyes 07/15/2017   Nuclear sclerotic cataract of both eyes 07/15/2017   Dry eye syndrome of both lacrimal glands 07/15/2017   Acquired trigger finger of left ring finger 05/04/2017   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 01/17/2017   Postural dizziness with presyncope 12/30/2016   Paroxysmal atrial fibrillation ([place]) 12/30/2016   Osteoarthritis 12/30/2016   NSAID long-term use 12/30/2016   Anemia 12/30/2016   DDD (degenerative disc disease), lumbar 11/26/2015   Facet syndrome, lumbar 11/26/2015   Greater trochanteric bursitis 11/26/2015   Diabetic neuropathy ([place]) 11/26/2015   Insomnia 11/11/2015   PTSD (post-traumatic stress disorder) 11/11/2015   GAD (generalized anxiety disorder) 11/11/2015   Severe episode of recurrent major depressive disorder, without psychotic features ([place]) 11/11/2015   Depression 11/11/2015   Obesity 06/27/2012   Tobacco use 06/27/2012   Sacroiliac joint dysfunction 05/18/2012   Right low back pain 04/19/2012   Cervical neck pain with evidence of disc disease 02/11/2012   Trochanteric bursitis of right hip 02/11/2012   Plantar fasciitis 03/11/2011   INSOMNIA 01/18/2008   Chronic pain syndrome 12/15/2007   DIABETIC PERIPHERAL NEUROPATHY 05/08/2007   HEPATITIS C 02/27/2007   DIABETES MELLITUS, TYPE II 02/27/2007   DEPRESSION 02/27/2007   Hypertension associated with diabetes ([place]) 02/27/2007   PCP:  [facility]:   [facility], [place] [address] Phone: [phone] Fax: [place], [place] [address] Phone: [phone] Fax: [place], [place] - [address] [place] [address] Phone: [phone] Fax: [phone]     Social Determinants of Health (SDOH) Interventions    Readmission Risk Interventions     View : No data to display.

## 2022-04-23 NOTE — Discharge Summary (Signed)
Physician Discharge Summary  Patient ID: [name] MRN: [id number] DOB/AGE: [birth year] 61 y.o.  Admit date: 04/19/2022 Discharge date: 04/28/2022  Admission Diagnoses:  Severe mitral regurgitation Tricuspid insufficiency Tobacco use Hepatitis C Type 2 DM Depression History of paroxysmal atrial fibrillation Chronic pain syndrome  Discharge Diagnoses:   Severe mitral regurgitation Tricuspid insufficiency Tobacco use Hepatitis C Type 2 DM Depression Chronic pain syndrome Postoperative atrial fibrillation and atrial flutter    Discharged Condition: stable  HPI: This is a 61 year old woman with a past history of paroxysmal atrial fibrillation, hepatitis C (treated), hypertension, type 2 diabetes complicated by neuropathy, fibromyalgia, arthritis, chronic neck pain, and chronic low back pain.  She had a history of mitral regurgitation.  She presented with shortness of breath in January.  She saw Dr. Julieanne Manson.  An echocardiogram was done which showed severe mitral regurgitation and moderate to severe tricuspid regurgitation.  There was preserved left ventricular function.  She had right and left heart catheterization.  She had normal coronaries.  She did have mildly elevated pulmonary artery pressure she had a large V wave.  She continues to have shortness of breath with exertion.  Has improved with medical therapy but has not resolved.  She also intermittently has orthopnea.  Denies peripheral edema.  No chest pain, pressure, or tightness.  Dr. Roxan Hockey discussed with her the proposed operative procedure which would be mitral valve repair or replacement and tricuspid valve repair.  Dr. Roxan Hockey informed her of the general nature of the procedure including the need for general anesthesia, the incisions to be used, the use of cardiopulmonary bypass, and the use of drainage tubes and temporary pacemaker wires postoperatively. She agreed to proceed with  surgery.  Hospital Course: Patient underwent mitral valve repair and tricuspid valve repair on 04/19/2022.  Following procedure, she was transferred to the surgical ICU in stable condition.  She remained hemodynamically stable.  She was weaned from the ventilator and extubated routinely.  Monitoring lines were removed along with the chest drains.  He was mobilized while still in the ICU to make progress with ambulation.  She was ready for transfer to progressive care on postop day 2.  She was initially bradycardic but had return of stable sinus rhythm.  She was anticoagulated with Coumadin.  She had expected postoperative acute blood loss anemia that was monitored.  She was started on iron replacement.  By postop day 3, her hematocrit had dropped to around 20%.  She seems to be tolerating this well but felt it was most appropriate to proceed with transfusion.  She was given 1 unit of packed red blood cells along with 1 platelet pheresis for platelet count of 78,000.  While the following day, her platelet count had recovered to above 100,000.  She had no clinical signs of ongoing blood loss.  Her hematocrit had improved marginally.  Patient wires were removed without complication.  She continued to make progress with mobility.  Since she lives alone and had no support from family members, she agreed to pursue placement in a skilled nursing facility short-term until she can regain independence.  She was evaluated by the physical therapy and Occupational Therapy teams.  Transition of care staff made arrangements for identifying a skilled nursing bed. She was anticoagulated with Coumadin postoperatively with appropriate response.  Her INR is seem to stabilize around 2.3-2.5 on Coumadin dosing 2.5 mg every other day alternating with 5 mg every other day. On post-op day 8 she developed paroxysmal atrial fibrillation and atrial  flutter.  The ventricular rates were well controlled and BP remained stable. We consulted  cardiology regarding management and ultimately the decision was made to add Cardizem CD 62m daily.   She will continue anticoagulation for a minimum of 3 months and will have cardiology follow up.   Her INR at discharge is 2.5.  She will need a PT / INR checked on Friday 5/26 and have the result called to the [facility] at [phone] Consults: cardiology  Significant Diagnostic Studies: CLINICAL DATA:  Postoperative check.   EXAM: CHEST - 2 VIEW   COMPARISON:  Portable chest yesterday at 5:25 a.m.   FINDINGS: 4:28 a.m., 04/22/2022. The heart is enlarged. There is a stable mediastinal configuration.   There are midline sternotomy sutures and prosthetic mitral and tricuspid valves again shown.   Interval removal of right IJ catheter introducer sheath. No pneumothorax is seen.   Small pleural effusions are again noted. The central vessels remain normal in caliber. There is consolidation or atelectasis in the base of both lungs, unchanged.   The mid and upper lungs remain clear. Overall aeration is unchanged.   IMPRESSION: 1. Persistent basilar atelectasis or consolidation, greater on the left and small underlying pleural effusions. Overall aeration is unchanged. 2. Interval removal right IJ catheter introducer sheath.     Electronically Signed   By: KTelford NabM.D.   On: 04/22/2022 05:46  Treatments: Operative Report    DATE OF PROCEDURE: 04/19/2022   PREOPERATIVE DIAGNOSES:  Severe mitral and tricuspid regurgitation.   POSTOPERATIVE DIAGNOSES:  Severe mitral and tricuspid regurgitation.   PROCEDURE:  Median sternotomy, extracorporeal circulation. Mitral valve repair with closure of cleft at A2-A3 and placement of 30 mm Edwards Physio II annuloplasty ring and tricuspid valve repair with 32 mm MC3 annuloplasty ring.   SURGEON:  SRevonda Standard HRoxan Hockey MD   ASSISTANT:  DLars Pinks   ANESTHESIA:  General.   FINDINGS:  Severe mitral and tricuspid  regurgitation with preserved left ventricular systolic function prebypass. Intraoperative clefted A2-A3 annular dilatation, mild thickening of chordae tendineae. Tricuspid valve with annular dilatation.   Postbypass transesophageal echocardiography showed no residual mitral regurgitation, gradient of 3 mmHg across the mitral valve. Mild tricuspid regurgitation with  Swan-Ganz catheter in place.   CLINICAL NOTE:  The patient is a 61year old woman with a history of paroxysmal atrial fibrillation, diabetes, hypertension, hepatitis C and mitral regurgitation.  She presented with worsening shortness of breath.  She was found to have severe mitral  regurgitation and moderate to severe tricuspid regurgitation.  She did not have any coronary disease.  She was referred for mitral and tricuspid valve repair or replacement.  The indications, risks, benefits, and alternatives were discussed in detail  with the patient.  She understood and accepted the risks and agreed to proceed.  Discharge Exam: Blood pressure 106/71, pulse 85, temperature 98.3 F (36.8 C), temperature source Oral, resp. rate 20, height 5' 11"  (1.803 m), weight 91.3 kg, last menstrual period 12/06/1976, SpO2 94 %.  General appearance: alert, cooperative, and no distress Neurologic: intact Heart: currently SR in the 70's but had AF with rate of 130 just prior to seeing her this morning. Lungs: breath sounds clear.  Abdomen: soft, non-tender.    Extremities: no peripheral edema Wound: the sternotomy is well approximated and dry.   [facility]  Discharge Instructions     Amb Referral to Cardiac Rehabilitation   Complete by: As directed  Diagnosis: Valve Repair   Valve: Mitral   After initial evaluation and assessments completed: Virtual Based Care may be provided alone or in conjunction with Phase 2 Cardiac Rehab based on patient barriers.: Yes      Allergies as of 04/28/2022        Reactions   Hydrocodone-acetaminophen Itching   Hydrocodone Itching   Tolerates oxycodone         Medication List     STOP taking these medications    BIOTIN PO   diclofenac Sodium 1 % Gel Commonly known as: VOLTAREN   furosemide 40 MG tablet Commonly known as: LASIX   lubiprostone 24 MCG capsule Commonly known as: AMITIZA   meloxicam 15 MG tablet Commonly known as: MOBIC   omeprazole 10 MG capsule Commonly known as: PRILOSEC   Salonpas 3.12-11-08 % Ptch Generic drug: Camphor-Menthol-Methyl Sal   spironolactone 25 MG tablet Commonly known as: Aldactone   Xiidra 5 % Soln Generic drug: Lifitegrast       TAKE these medications    Accu-Chek FastClix Lancets Misc 1 each by Does not apply route daily. Check blood sugar once daily. 250.00   albuterol 108 (90 Base) MCG/ACT inhaler Commonly known as: VENTOLIN HFA Inhale 2 puffs into the lungs every 6 (six) hours as needed for wheezing or shortness of breath.   aspirin EC 81 MG tablet Take 81 mg by mouth daily.   atorvastatin 20 MG tablet Commonly known as: LIPITOR Take 20 mg by mouth daily.   baclofen 10 MG tablet Commonly known as: LIORESAL Take 10 mg by mouth 2 (two) times daily.   buPROPion 300 MG 24 hr tablet Commonly known as: WELLBUTRIN XL Take 300 mg by mouth in the morning.   dexlansoprazole 60 MG capsule Commonly known as: DEXILANT Take 60 mg by mouth daily.   diltiazem 120 MG 24 hr capsule Commonly known as: CARDIZEM CD Take 1 capsule (120 mg total) by mouth daily. Start taking on: Apr 29, 2022   ferrous [id number] C-folic acid capsule Commonly known as: TRINSICON / FOLTRIN Take 1 capsule by mouth 2 (two) times daily after a meal.   gabapentin 600 MG tablet Commonly known as: NEURONTIN Take 600 mg by mouth 3 (three) times daily.   glucose blood test strip Commonly known as: Accu-Chek SmartView Check blood sugar once daily. 250.00   hydrOXYzine 25 MG  tablet Commonly known as: ATARAX Take 25 mg by mouth in the morning, at noon, in the evening, and at bedtime.   linagliptin 5 MG Tabs tablet Commonly known as: TRADJENTA Take 5 mg by mouth in the morning.   losartan 50 MG tablet Commonly known as: COZAAR Take 1 tablet (50 mg total) by mouth in the morning. Start taking on: Apr 29, 2022 What changed:  medication strength how much to take   methocarbamol 500 MG tablet Commonly known as: ROBAXIN Take 500 mg by mouth 4 (four) times daily.   metoprolol tartrate 25 MG tablet Commonly known as: LOPRESSOR Take 1 tablet (25 mg total) by mouth 2 (two) times daily. What changed:  medication strength how much to take when to take this   naloxone 4 MG/0.1ML Liqd nasal spray kit Commonly known as: NARCAN Place 1 spray into the nose once.   oxyCODONE 15 MG immediate release tablet Commonly known as: ROXICODONE Take 1 tablet (15 mg total) by mouth every 4 (four) hours as needed for pain. What changed: when to take this   Premarin 0.45 MG tablet Generic  drug: estrogens (conjugated) Take 0.45 mg by mouth daily.   traZODone 100 MG tablet Commonly known as: DESYREL Take 2.5 tablets (250 mg total) by mouth at bedtime as needed for sleep. What changed:  how much to take when to take this   VITAMIN B 12 PO Take 50 mcg by mouth.   warfarin 2.5 MG tablet Commonly known as: COUMADIN Take 1 tablet (2.5 mg total) by mouth every Monday,Wednesday,Friday, and Sunday at 6 PM.   warfarin 5 MG tablet Commonly known as: COUMADIN Take 1 tablet (5 mg total) by mouth every Tuesday, Thursday, and Saturday at 6 PM. Start taking on: Apr 29, 2022        Contact information for follow-up providers     Swinyer, Lanice Schwab, NP. Go on 05/11/2022.   Specialty: Nurse Practitioner Why: Your appointment is at 8:25am. Contact information: [place] [address] [phone]         [facility]. Go on 05/18/2022.   Specialty: Cardiothoracic Surgery Why: Your appointment is at 3:30.  Please arrive 30 minutes early for a chest x-ray to be performed by [facility] located on the first floor of the same building. Contact information: [place], [place] [facility] .   Specialty: Cardiology Contact information: [address] [place] [phone]             Contact information for after-discharge care     Destination     [facility] .   Service: Skilled Nursing Contact information: [address] [place] [phone]                   The patient has been discharged on:   1.Beta Blocker:  Yes [ x ]                              No   [   ]                              If No, reason:  2.Ace Inhibitor/ARB: Yes [ x  ]                                     No  [    ]                                     If No, reason:  3.Statin:   Yes [  x ]                  No  [   ]                  If No, reason:  4.Ecasa:  Yes  [   ]                  No   [ x  ]                  If No, reason: No coronary disease,  on Coumadin    Signed: Antony Odea, PA-C 04/28/2022, 12:09 PM

## 2022-04-23 NOTE — Progress Notes (Addendum)
[address]            [place] [address]          [phone]   4 Days Post-Op Procedure(s) (LRB): MITRAL VALVE REPAIR WITH 30MM PHYSIO II ANNULOPLASTY RING (N/A) TRICUSPID VALVE REPAIR WITH 32MM MC3 ANNULOPLASTY RING (N/A) TRANSESOPHAGEAL ECHOCARDIOGRAM (TEE) (N/A)  Total Length of Stay:  LOS: 4 days   Subjective:  Walked in the hall yesterday but says she felt weaker and mor short of breath than previously. Having some soreness in her right chest and sides.  No evidence of any further bleeding. No GI symptoms, no melena.   Objective: Vital signs in last 24 hours: Temp:  [97.8 F (36.6 C)-100.6 F (38.1 C)] 97.8 F (36.6 C) (05/19 0342) Pulse Rate:  [62-76] 71 (05/19 0610) Cardiac Rhythm: Other (Comment) (05/18 2004) Resp:  [16-20] 18 (05/19 0610) BP: (135-167)/(69-87) 158/77 (05/19 0342) SpO2:  [90 %-99 %] 94 % (05/19 0610) Weight:  [96.5 kg] 96.5 kg (05/19 0610)  Filed Weights   04/21/22 0600 04/22/22 0330 04/23/22 0610  Weight: 100.1 kg 99.1 kg 96.5 kg    Weight change: -2.566 kg     Intake/Output from previous day: 05/18 0701 - 05/19 0700 In: 994 [P.O.:200; Blood:794] Out: 1600 [Urine:1600]  Intake/Output this shift: No intake/output data recorded.  Current Meds: Scheduled Meds:  aspirin EC  81 mg Oral Daily   atorvastatin  20 mg Oral Daily   baclofen  10 mg Oral BID   buPROPion  300 mg Oral q AM   docusate sodium  200 mg Oral Daily   estrogens (conjugated)  0.45 mg Oral Daily   ferrous [id number] C-folic acid  1 capsule Oral BID PC   furosemide  40 mg Oral Daily   gabapentin  600 mg Oral TID   hydrOXYzine  25 mg Oral QID   insulin aspart  0-15 Units Subcutaneous TID WC   insulin detemir  15 Units Subcutaneous BID   methocarbamol  500 mg Oral QID   metoprolol tartrate  12.5 mg Oral BID   pantoprazole  40 mg Oral Daily   potassium chloride  20 mEq Oral TID   sodium chloride flush  3 mL Intravenous  Q12H   warfarin  2 mg Oral q1600   Warfarin - Physician Dosing Inpatient   Does not apply q1600   Continuous Infusions:  sodium chloride     PRN Meds:.sodium chloride, acetaminophen, albuterol, ondansetron **OR** ondansetron (ZOFRAN) IV, oxyCODONE, sodium chloride flush, traMADol, traZODone  General appearance: alert, cooperative, and no distress Neurologic: intact Heart: stable SR with rare PVC's Lungs: breath sounds clear.  Abdomen: soft, non-tender.  The bleeding site on the abdomen from the Lovenox injection noted yesterday is dry.  Extremities: mild peripheral edema Wound: the sternotomy is covered with a dry Aquacel dressing.   Lab Results: CBC: Recent Labs    04/22/22 0557 04/23/22 0131  WBC 7.4 7.0  HGB 6.7* 7.5*  HCT 20.3* 20.9*  PLT 78* 101*    BMET:  Recent Labs    04/22/22 0557 04/23/22 0131  NA 134* 135  K 3.9 4.7  CL 102 104  CO2 24 23  GLUCOSE 87 100*  BUN 18 17  CREATININE 1.23* 1.04*  CALCIUM 8.1* 8.2*     CMET: Lab Results  Component Value Date   WBC 7.0 04/23/2022   HGB 7.5 (  L) 04/23/2022   HCT 20.9 (L) 04/23/2022   PLT 101 (L) 04/23/2022   GLUCOSE 100 (H) 04/23/2022   CHOL 180 03/25/2016   TRIG 122 03/25/2016   HDL 39 (L) 03/25/2016   LDLCALC 117 03/25/2016   ALT 14 04/15/2022   AST 22 04/15/2022   NA 135 04/23/2022   K 4.7 04/23/2022   CL 104 04/23/2022   CREATININE 1.04 (H) 04/23/2022   BUN 17 04/23/2022   CO2 23 04/23/2022   TSH 1.75 03/25/2016   INR 1.8 (H) 04/23/2022   HGBA1C 6.1 (H) 04/15/2022   MICROALBUR 0.50 06/19/2010      PT/INR:  Recent Labs    04/23/22 0131  LABPROT 20.8*  INR 1.8*    Radiology: No results found.   Assessment/Plan: S/P Procedure(s) (LRB): MITRAL VALVE REPAIR WITH 30MM PHYSIO II ANNULOPLASTY RING (N/A) TRICUSPID VALVE REPAIR WITH 32MM MC3 ANNULOPLASTY RING (N/A) TRANSESOPHAGEAL ECHOCARDIOGRAM (TEE) (N/A)  -POD4 MV and TV repair for severe MR/TR.  Cardiac rhythm stable, BP trending  higher so will increase the metoprolol to '25mg'$  BID. Progressing with mobility. INR 1.8, continue Coumadin 2.'5mg'$  daily, remove the pacer wires today.  -HEME-Expected acute blood loss anemia- Hct 21% this morning after transfusion with 1 unit PRBC's yesterday. Will  discuss transfusing another unit. Continue iron supplement.  Plt count 78,000 --> 101,000 after transfusion of 1 plt pheresis. No signs of ongoing losses. Monitor.  -ENDO-Type 2 DM- A1C 6.1.  Good control on current regimen.   -Pulm- good sats on RA, working on pulm hygiene.   -DVT PPX- ambulate, enoxaparin stopped due to anemia and thrombocytopenia.  -RENAL- normal function pre-op with small increase in Creat post-op now trending back down. Wt below pre-op.  -H/O chronic pain syndrome- gabapentin and Wellbutrin have been resumed and pain control is fair.  -Disposition- Patient does not have support at home and will need short-term SNF at discharge. Appreciate input from  PT/OT/TOC.   Antony Odea, PA-C 04/23/2022 7:10 AM  Patient seen and examined, agree with above Looks great SNF when available  Remo Lipps C. Roxan Hockey, MD [facility] [phone]

## 2022-04-23 NOTE — Progress Notes (Signed)
Removed epicardial pacing wires per order. All tips intact. VSS. Patient tolerated well. Educated pt on one hour bedrest. Patient verbalized understanding. Will continue to monitor. Call bell in reach.  Daymon Larsen, RN

## 2022-04-24 LAB — GLUCOSE, CAPILLARY
Glucose-Capillary: 107 mg/dL — ABNORMAL HIGH (ref 70–99)
Glucose-Capillary: 120 mg/dL — ABNORMAL HIGH (ref 70–99)
Glucose-Capillary: 107 mg/dL — ABNORMAL HIGH (ref 70–99)
Glucose-Capillary: 142 mg/dL — ABNORMAL HIGH (ref 70–99)

## 2022-04-24 LAB — PROTIME-INR
INR: 1.7 — ABNORMAL HIGH (ref 0.8–1.2)
Prothrombin Time: 20 seconds — ABNORMAL HIGH (ref 11.4–15.2)

## 2022-04-24 LAB — CBC
HCT: 22.4 % — ABNORMAL LOW (ref 36.0–46.0)
Hemoglobin: 7.7 g/dL — ABNORMAL LOW (ref 12.0–15.0)
MCH: 29.5 pg (ref 26.0–34.0)
MCHC: 34.4 g/dL (ref 30.0–36.0)
MCV: 85.8 fL (ref 80.0–100.0)
Platelets: 109 10*3/uL — ABNORMAL LOW (ref 150–400)
RBC: 2.61 MIL/uL — ABNORMAL LOW (ref 3.87–5.11)
RDW: 14.9 % (ref 11.5–15.5)
WBC: 6.4 10*3/uL (ref 4.0–10.5)
nRBC: 0.8 % — ABNORMAL HIGH (ref 0.0–0.2)

## 2022-04-24 LAB — BASIC METABOLIC PANEL
Anion gap: 10 (ref 5–15)
BUN: 15 mg/dL (ref 8–23)
CO2: 24 mmol/L (ref 22–32)
Calcium: 8.3 mg/dL — ABNORMAL LOW (ref 8.9–10.3)
Chloride: 99 mmol/L (ref 98–111)
Creatinine, Ser: 1.06 mg/dL — ABNORMAL HIGH (ref 0.44–1.00)
GFR, Estimated: 59 mL/min — ABNORMAL LOW (ref >60)
Glucose, Bld: 108 mg/dL — ABNORMAL HIGH (ref 70–99)
Potassium: 5 mmol/L (ref 3.5–5.1)
Sodium: 133 mmol/L — ABNORMAL LOW (ref 135–145)

## 2022-04-24 MED ORDER — POTASSIUM CHLORIDE CRYS ER 20 MEQ PO TBCR
20.0000 meq | EXTENDED_RELEASE_TABLET | Freq: Once | ORAL | Status: AC
  Administered 2022-04-25: 20 meq via ORAL
  Filled 2022-04-24: qty 1

## 2022-04-24 MED ORDER — WARFARIN SODIUM 5 MG PO TABS
5.0000 mg | ORAL_TABLET | ORAL | Status: DC
Start: 2022-04-24 — End: 2022-04-29
  Administered 2022-04-24 – 2022-04-27 (×2): 5 mg via ORAL
  Filled 2022-04-24 (×3): qty 1

## 2022-04-24 MED ORDER — WARFARIN SODIUM 2.5 MG PO TABS
2.5000 mg | ORAL_TABLET | ORAL | Status: DC
  Administered 2022-04-25 – 2022-04-28 (×3): 2.5 mg via ORAL
  Filled 2022-04-24 (×3): qty 1

## 2022-04-24 NOTE — Progress Notes (Addendum)
[address]       [place] [address]             [phone]      5 Days Post-Op Procedure(s) (LRB): MITRAL VALVE REPAIR WITH 30MM PHYSIO II ANNULOPLASTY RING (N/A) TRICUSPID VALVE REPAIR WITH 32MM MC3 ANNULOPLASTY RING (N/A) TRANSESOPHAGEAL ECHOCARDIOGRAM (TEE) (N/A) Subjective: Somewhat uncomfortable when taking deep breaths but mostly feeling better  Objective: Vital signs in last 24 hours: Temp:  [98.1 F (36.7 C)-98.8 F (37.1 C)] 98.8 F (37.1 C) (05/20 0822) Pulse Rate:  [60-74] 61 (05/20 0400) Cardiac Rhythm: Normal sinus rhythm (05/20 0813) Resp:  [14-20] 18 (05/20 0822) BP: (122-159)/(66-93) 129/73 (05/20 0822) SpO2:  [96 %-100 %] 100 % (05/20 0822) Weight:  [96.3 kg] 96.3 kg (05/20 0601)  Hemodynamic parameters for last 24 hours:    Intake/Output from previous day: 05/19 0701 - 05/20 0700 In: 480 [P.O.:480] Out: 1750 [Urine:1750] Intake/Output this shift: No intake/output data recorded.  General appearance: alert, cooperative, and no distress Heart: regular rate and rhythm Lungs: clear to auscultation bilaterally Abdomen: benign Extremities: no edema Wound: incis healing well  Lab Results: Recent Labs    04/23/22 0131 04/24/22 0119  WBC 7.0 6.4  HGB 7.5* 7.7*  HCT 20.9* 22.4*  PLT 101* 109*   BMET:  Recent Labs    04/23/22 0131 04/24/22 0119  NA 135 133*  K 4.7 5.0  CL 104 99  CO2 23 24  GLUCOSE 100* 108*  BUN 17 15  CREATININE 1.04* 1.06*  CALCIUM 8.2* 8.3*    PT/INR:  Recent Labs    04/24/22 0119  LABPROT 20.0*  INR 1.7*   ABG    Component Value Date/Time   PHART 7.319 (L) 04/19/2022 2202   HCO3 20.9 04/19/2022 2202   TCO2 22 04/19/2022 2202   ACIDBASEDEF 5.0 (H) 04/19/2022 2202   O2SAT 99 04/19/2022 2202   CBG (last 3)  Recent Labs    04/23/22 1558 04/23/22 2104 04/24/22 0555  GLUCAP 101* 118* 107*    Meds Scheduled Meds:  aspirin EC  81 mg Oral Daily   atorvastatin  20 mg Oral Daily    baclofen  10 mg Oral BID   buPROPion  300 mg Oral q AM   docusate sodium  200 mg Oral Daily   estrogens (conjugated)  0.45 mg Oral Daily   ferrous [id number] C-folic acid  1 capsule Oral BID PC   furosemide  40 mg Oral Daily   gabapentin  600 mg Oral TID   hydrOXYzine  25 mg Oral QID   insulin aspart  0-15 Units Subcutaneous TID WC   linagliptin  5 mg Oral q AM   losartan  100 mg Oral q AM   methocarbamol  500 mg Oral QID   metoprolol tartrate  25 mg Oral BID   pantoprazole  40 mg Oral Daily   potassium chloride  20 mEq Oral TID   sodium chloride flush  3 mL Intravenous Q12H   warfarin  2.5 mg Oral q1600   Warfarin - Physician Dosing Inpatient   Does not apply q1600   Continuous Infusions:  sodium chloride     PRN Meds:.sodium chloride, acetaminophen, albuterol, lactulose, ondansetron **OR** ondansetron (ZOFRAN) IV, oxyCODONE, sodium chloride flush, traMADol, traZODone  Xrays No results found.  Assessment/Plan: S/P Procedure(s) (LRB): MITRAL VALVE REPAIR WITH 30MM PHYSIO II ANNULOPLASTY RING (N/A) TRICUSPID VALVE REPAIR WITH 32MM MC3 ANNULOPLASTY RING (N/A) TRANSESOPHAGEAL ECHOCARDIOGRAM (TEE) (N/A)  POD#5  1 afeb, VSS s BP 120's-150's, beta blocker increased yesterday, also on ARB- control has improved  2 sats good on RA 3 good UOP, weight at preop, creat stable at 1.06, normal BUN- currently on po lasix 4 minor hyponatremia- monitor  5 INR 1.7, may need to consider increase in coumadin dose , cont 2.5 for now 6 H/H  a little better today at 7.7/22.4 7 thrombocytopenia trend improving 8 CXR stable aeration changes in lower lung fields 9 Routine rehab and pulm hygiene 10 SNF bed search for ST rehab      LOS: 5 days    Gaspar Bidding Pager [phone] 04/24/2022   Patient seen and examined, agree with above Awaiting SNF Change coumadin to 5 mg alternating with 2.5 mg  Remo Lipps C. Roxan Hockey, MD [facility] [phone]

## 2022-04-24 NOTE — Progress Notes (Signed)
Patient refused ambulation, she would like a bath first and CNA stated she would walk with her after bathing.   Education completed re: activity restrictions and progression, sternal precautions, exercise progression, smoking cessation, signs of infection, when to call the surgeon.  Awaiting SNF placement, referred to phase II cardiac rehab, not appropriate at this time.  If she progresses and becomes much stronger after SNF she may be appropriate. [phone]

## 2022-04-25 LAB — BASIC METABOLIC PANEL
Anion gap: 8 (ref 5–15)
BUN: 18 mg/dL (ref 8–23)
CO2: 25 mmol/L (ref 22–32)
Calcium: 8.2 mg/dL — ABNORMAL LOW (ref 8.9–10.3)
Chloride: 101 mmol/L (ref 98–111)
Creatinine, Ser: 1.09 mg/dL — ABNORMAL HIGH (ref 0.44–1.00)
GFR, Estimated: 57 mL/min — ABNORMAL LOW (ref >60)
Glucose, Bld: 127 mg/dL — ABNORMAL HIGH (ref 70–99)
Potassium: 4.5 mmol/L (ref 3.5–5.1)
Sodium: 134 mmol/L — ABNORMAL LOW (ref 135–145)

## 2022-04-25 LAB — GLUCOSE, CAPILLARY
Glucose-Capillary: 116 mg/dL — ABNORMAL HIGH (ref 70–99)
Glucose-Capillary: 109 mg/dL — ABNORMAL HIGH (ref 70–99)
Glucose-Capillary: 116 mg/dL — ABNORMAL HIGH (ref 70–99)
Glucose-Capillary: 108 mg/dL — ABNORMAL HIGH (ref 70–99)

## 2022-04-25 LAB — PROTIME-INR
INR: 2 — ABNORMAL HIGH (ref 0.8–1.2)
Prothrombin Time: 22.1 seconds — ABNORMAL HIGH (ref 11.4–15.2)

## 2022-04-25 NOTE — Progress Notes (Addendum)
[address]       [place] [address]             [phone]      6 Days Post-Op Procedure(s) (LRB): MITRAL VALVE REPAIR WITH 30MM PHYSIO II ANNULOPLASTY RING (N/A) TRICUSPID VALVE REPAIR WITH 32MM MC3 ANNULOPLASTY RING (N/A) TRANSESOPHAGEAL ECHOCARDIOGRAM (TEE) (N/A) Subjective: Steady progress  Objective: Vital signs in last 24 hours: Temp:  [98 F (36.7 C)-98.9 F (37.2 C)] 98 F (36.7 C) (05/21 0755) Pulse Rate:  [60-70] 66 (05/21 0755) Cardiac Rhythm: Normal sinus rhythm (05/21 0816) Resp:  [17-20] 18 (05/21 0755) BP: (99-145)/(59-82) 131/77 (05/21 0755) SpO2:  [92 %-94 %] 94 % (05/21 0755) Weight:  [94.5 kg] 94.5 kg (05/21 0416)  Hemodynamic parameters for last 24 hours:    Intake/Output from previous day: 05/20 0701 - 05/21 0700 In: 240 [P.O.:240] Out: 1800 [Urine:1800] Intake/Output this shift: Total I/O In: 240 [P.O.:240] Out: -   General appearance: alert, cooperative, and no distress Heart: regular rate and rhythm Lungs: mildly dim in bases Abdomen: benign Extremities: no edema Wound: incis healing  Lab Results: Recent Labs    04/23/22 0131 04/24/22 0119  WBC 7.0 6.4  HGB 7.5* 7.7*  HCT 20.9* 22.4*  PLT 101* 109*   BMET:  Recent Labs    04/24/22 0119 04/25/22 0336  NA 133* 134*  K 5.0 4.5  CL 99 101  CO2 24 25  GLUCOSE 108* 127*  BUN 15 18  CREATININE 1.06* 1.09*  CALCIUM 8.3* 8.2*    PT/INR:  Recent Labs    04/25/22 0336  LABPROT 22.1*  INR 2.0*   ABG    Component Value Date/Time   PHART 7.319 (L) 04/19/2022 2202   HCO3 20.9 04/19/2022 2202   TCO2 22 04/19/2022 2202   ACIDBASEDEF 5.0 (H) 04/19/2022 2202   O2SAT 99 04/19/2022 2202   CBG (last 3)  Recent Labs    04/24/22 1640 04/24/22 2105 04/25/22 0613  GLUCAP 107* 142* 116*    Meds Scheduled Meds:  aspirin EC  81 mg Oral Daily   atorvastatin  20 mg Oral Daily   baclofen  10 mg Oral BID   buPROPion  300 mg Oral q AM   docusate  sodium  200 mg Oral Daily   estrogens (conjugated)  0.45 mg Oral Daily   ferrous [id number] C-folic acid  1 capsule Oral BID PC   furosemide  40 mg Oral Daily   gabapentin  600 mg Oral TID   hydrOXYzine  25 mg Oral QID   insulin aspart  0-15 Units Subcutaneous TID WC   linagliptin  5 mg Oral q AM   losartan  100 mg Oral q AM   methocarbamol  500 mg Oral QID   metoprolol tartrate  25 mg Oral BID   pantoprazole  40 mg Oral Daily   sodium chloride flush  3 mL Intravenous Q12H   warfarin  2.5 mg Oral Q M,W,F,Su-1800   warfarin  5 mg Oral Q T,Th,Sat-1800   Warfarin - Physician Dosing Inpatient   Does not apply q1600   Continuous Infusions:  sodium chloride     PRN Meds:.sodium chloride, acetaminophen, albuterol, lactulose, ondansetron **OR** ondansetron (ZOFRAN) IV, oxyCODONE, sodium chloride flush, traMADol, traZODone  Xrays No results found.  Assessment/Plan: S/P Procedure(s) (LRB): MITRAL VALVE REPAIR WITH 30MM PHYSIO II ANNULOPLASTY RING (N/A) TRICUSPID VALVE REPAIR WITH 32MM MC3 ANNULOPLASTY RING (N/A) TRANSESOPHAGEAL ECHOCARDIOGRAM (TEE) (N/A)  POD#6 1 afeb,  VSS s BP 99-140's, sinus rhythm, sats good on RA 2 good uop, weight below preop, keep lasix for now as has some small pleural effus 3 BUN/Creat stable 4 INR 2.0 . Cont current schedule for coumadin with alternating dosing 2.5/'5mg'$  6 cont rehab/pulm hygiene 7 SNF bed search underway    LOS: 6 days    John Giovanni PA-C Pager [phone] 04/25/2022   Patient seen and examined, agree with above Looks great today, ready for DC when SNF arrangements complete  Remo Lipps C. Roxan Hockey, MD [facility] [phone]

## 2022-04-25 NOTE — TOC Progression Note (Signed)
Transition of Care [facility]) - Progression Note    Patient Details  Name: Marketa Midkiff MRN: [id number] Date of Birth: 10/13/1961  Transition of Care [facility]) CM/SW [place], LCSW Phone Number:[phone] 04/25/2022, 2:39 PM  Clinical Narrative:     Pt's only has one bed offers, will provide bed offers when more comes available.  TOC team will continue to assist with discharge planning needs.   Expected Discharge Plan: [place] Barriers to Discharge: Continued Medical Work up, Ship broker, SNF Pending bed offer  Expected Discharge Plan and Services Expected Discharge Plan: [place] In-house Referral: Clinical Social Work     Living arrangements for the past 2 months: Single Family Home                                       Social Determinants of Health (SDOH) Interventions    Readmission Risk Interventions     View : No data to display.

## 2022-04-25 NOTE — Plan of Care (Signed)
  Problem: Education: Goal: Knowledge of General Education information will improve Description Including pain rating scale, medication(s)/side effects and non-pharmacologic comfort measures Outcome: Progressing   Problem: Health Behavior/Discharge Planning: Goal: Ability to manage health-related needs will improve Outcome: Progressing   

## 2022-04-26 LAB — PROTIME-INR
INR: 2 — ABNORMAL HIGH (ref 0.8–1.2)
Prothrombin Time: 22.5 seconds — ABNORMAL HIGH (ref 11.4–15.2)

## 2022-04-26 LAB — GLUCOSE, CAPILLARY
Glucose-Capillary: 136 mg/dL — ABNORMAL HIGH (ref 70–99)
Glucose-Capillary: 124 mg/dL — ABNORMAL HIGH (ref 70–99)
Glucose-Capillary: 104 mg/dL — ABNORMAL HIGH (ref 70–99)
Glucose-Capillary: 107 mg/dL — ABNORMAL HIGH (ref 70–99)

## 2022-04-26 NOTE — Progress Notes (Addendum)
Physical Therapy Treatment Patient Details Name: [name] MRN: [id number] DOB: [birth year] Today's Date: 04/26/2022   History of Present Illness Pt is a 61 y.o. female who presented 04/19/22 for TEE and mitral and tricuspid valve repair. PMH: paroxysmal atrial fibrillation, hepatitis C (treated), hypertension, type 2 diabetes complicated by neuropathy, fibromyalgia, arthritis, chronic neck pain, and chronic low back pain.    PT Comments    Pt received in supine, agreeable to therapy session with max encouragement. Emphasis on benefits of mobility/risks of immobility, s/sx of DVT/blood clots (per pt question), and sternal precautions compliance during bed mobility and transfers (handout given to reinforce). Pt needing up to minA for bed mobility and seated scooting. Pt self-limiting today due to severe fatigue after working with cardiac rehab and OT earlier in day. Pt reports 7/10 modified RPE (fatigue) at end of session. After pt back to bed, she called out again for assist getting OOB to [facility] and pt sat up to EOB with mod cues for precaution compliance and minA for bed mobility, sit<>stand and stand pivot transfer from elevated bed height with B HHA while pivoting.  Pt continues to benefit from PT services to progress toward functional mobility goals.   Recommendations for follow up therapy are one component of a multi-disciplinary discharge planning process, led by the attending physician.  Recommendations may be updated based on patient status, additional functional criteria and insurance authorization.  Follow Up Recommendations  Skilled nursing-short term rehab (<3 hours/day)     Assistance Recommended at Discharge Frequent or constant Supervision/Assistance  Patient can return home with the following A little help with walking and/or transfers;A little help with bathing/dressing/bathroom;Assistance with cooking/housework;Assist for transportation   Equipment Recommendations  None  recommended by PT    Recommendations for Other Services       Precautions / Restrictions Precautions Precautions: Fall;Sternal Precaution Booklet Issued: Yes (comment) Precaution Comments: reviewed sternal precautions, sternal HEP/prec handouts given Required Braces or Orthoses: Other Brace (pt donned chest binder for comfort during session with assist from PTA) Restrictions Weight Bearing Restrictions: Yes Other Position/Activity Restrictions: sternal precautions     Mobility  Bed Mobility Overal bed mobility: Needs Assistance Bed Mobility: Rolling, Supine to Sit, Sit to Supine Rolling: Min guard   Supine to sit: Min assist, HOB elevated Sit to supine: Min assist   General bed mobility comments: Cues for sternal precautions and handout given to reinforce log rolling for EOB. Emphasis this session on core activation for supine to long sit with minA support at back to prevent pt extending shoulders back into bed or using UE.    Transfers     General transfer comment: seated scooting backward in long sit, cues for contralateral hip scooting but poor carryover of technique. Pt initially defers standing due to increased fatigue after supine/seated UE/LE exercises and working with cardiac rehab/OT earlier in day but after ~15 mins pt agreeable to stand up and pivot to [facility] with minA and B HHA, mod cues for technique and compliance with Move in the Tube precautions.        Balance Overall balance assessment: Needs assistance Sitting-balance support: No upper extremity supported, Feet supported Sitting balance-Leahy Scale: Fair Sitting balance - Comments: Static sitting in bed with supervision for safety, needing up to minA for dynamic seated tasks  Standing balance: Fair with BUE supported, pt needing external assist without AD      Cognition Arousal/Alertness: Awake/alert Behavior During Therapy: WFL for tasks assessed/performed Overall Cognitive Status: Within Functional  Limits for tasks assessed     General Comments: pt anxious re: pain and c/o fatigue, agreeable to limited therapy session with encouragement, able to perform some seated tasks but refusing gait progression due to fatigue.        Exercises Other Exercises Other Exercises: supine BLE AROM: heel slides, hip abduction, ankle pumps, glute sets x10 reps ea Other Exercises: seated BLE AROM: hip flexion, hip ADduction, LAQ x10 reps ea Other Exercises: seated BUE AROM: shoulder flexion (to 90*), shoulder shrugs/rolls (alternating sides for decreased pain), elbow flex/ext x10 reps ea Other Exercises: crunches while hugging heart pillow in bed from slightly elevated bed posture to long sitting with emphasis on core activation x5 reps    General Comments General comments (skin integrity, edema, etc.): reviewed importance of continued mobility OOB for pressure relief but also decreased risk of blood clots, pt instructed on s/sx of DVT/blood clots as well as manual splinting.      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 PT Goal Formulation: With patient Time For Goal Achievement: 05/13/22 Progress towards PT goals: Progressing toward goals (slow progress, fatigue/pain limiting)    Frequency    Min 3X/week      PT Plan Current plan remains appropriate       AM-PAC PT "6 Clicks" Mobility   Outcome Measure  Help needed turning from your back to your side while in a flat bed without using  bedrails?: A Little Help needed moving from lying on your back to sitting on the side of a flat bed without using bedrails?: A Little Help needed moving to and from a bed to a chair (including a wheelchair)?: A Little Help needed standing up from a chair using your arms (e.g., wheelchair or bedside chair)?: A Lot (pt too fatigued to attempt, anticipated score below based on mobility during session) Help needed to walk in hospital room?: A Lot (pt too fatigued to attempt, anticipated score below based on mobility during session) Help needed climbing 3-5 steps with a railing? : Total 6 Click Score: 14    End of Session Equipment Utilized During Treatment: Other (comment) (chest binder donned for comfort) Activity Tolerance: Patient tolerated treatment well Patient left: in bed;with call bell/phone within reach;with bed alarm set;Other (comment) (bed in chair posture) Nurse Communication: Mobility status PT Visit Diagnosis: Unsteadiness on feet (R26.81);Other abnormalities of gait and mobility (R26.89);Muscle weakness (generalized) (M62.81);Difficulty in walking, not elsewhere classified (R26.2);Pain Pain - part of body:  (sternal incision)     Time: 2778-2423, 5361-4431 PT Time Calculation (min) (ACUTE ONLY): 22 min +3 min  Charges:  $Therapeutic Exercise: 8-22 mins                     Cherrise Occhipinti P., PTA Acute Rehabilitation Services Secure Chat Preferred 9a-5:30pm Office: [place] 04/26/2022, 3:22 PM

## 2022-04-26 NOTE — Progress Notes (Signed)
Occupational Therapy Treatment Patient Details Name: [name] MRN: [id number] DOB: [birth year] Today's Date: 04/26/2022   History of present illness 61 y.o. female who presented 04/19/22 for TEE and mitral and tricuspid valve repair. PMH: paroxysmal atrial fibrillation, hepatitis C (treated), hypertension, type 2 diabetes complicated by neuropathy, fibromyalgia, arthritis, chronic neck pain, and chronic low back pain.   OT comments  Pt progressing towards established OT goals. Focused session on compensatory technique for UB and LB dressing. Pt demonstrating understanding while donning/doffing shirt and underwear with Supervision-Min Guard A. Pt performing sit<>stand and side steps to [facility] with Min Guard A. Continue to recommend dc to post-acute rehab to increase independence and safety. Will continue to follow acutely as admitted.    Recommendations for follow up therapy are one component of a multi-disciplinary discharge planning process, led by the attending physician.  Recommendations may be updated based on patient status, additional functional criteria and insurance authorization.    Follow Up Recommendations  Skilled nursing-short term rehab (<3 hours/day)    Assistance Recommended at Discharge Frequent or constant Supervision/Assistance  Patient can return home with the following  A little help with walking and/or transfers;A little help with bathing/dressing/bathroom;Assistance with cooking/housework;Assist for transportation   Equipment Recommendations  None recommended by OT    Recommendations for Other Services      Precautions / Restrictions Precautions Precautions: Fall;Sternal Precaution Booklet Issued: Yes (comment) Precaution Comments: reviewed sternal precautions, sternal HEP/prec handouts given Required Braces or Orthoses: Other Brace (pt donned chest binder for comfort during session with assist from PTA) Restrictions Weight Bearing Restrictions: Yes Other  Position/Activity Restrictions: sternal precautions       Mobility Bed Mobility Overal bed mobility: Needs Assistance Bed Mobility: Rolling, Sidelying to Sit, Sit to Sidelying Rolling: Min guard Sidelying to sit: Min guard     Sit to sidelying: Min guard General bed mobility comments: Min Guard A for log roll    Transfers Overall transfer level: Needs assistance   Transfers: Sit to/from Stand Sit to Stand: Min guard           General transfer comment: Min guard A for safety     Balance Overall balance assessment: Needs assistance Sitting-balance support: No upper extremity supported, Feet supported Sitting balance-Leahy Scale: Fair     Standing balance support: No upper extremity supported Standing balance-Leahy Scale: Fair                             ADL either performed or assessed with clinical judgement   ADL Overall ADL's : Needs assistance/impaired                 Upper Body Dressing : Supervision/safety;Set up;Sitting Upper Body Dressing Details (indicate cue type and reason): Educating pt on comepsnatory techniques for UB dressing. Pt donning and doffing her shirt without difficulty Lower Body Dressing: Min guard;Sitting/lateral leans Lower Body Dressing Details (indicate cue type and reason): Educating pt on compensatory techniques for LB dressing. Pt donning underwear. No difficulty over feet and using figure four. Declined to stand             Functional mobility during ADLs: Min guard (side steps to [facility]) General ADL Comments: Pt performing dressing and taking side steps towards [facility]    Extremity/Trunk Assessment Upper Extremity Assessment Upper Extremity Assessment: Overall WFL for tasks assessed   Lower Extremity Assessment Lower Extremity Assessment: Defer to PT evaluation  Vision   Vision Assessment?: No apparent visual deficits   Perception     Praxis      Cognition Arousal/Alertness:  Awake/alert Behavior During Therapy: WFL for tasks assessed/performed Overall Cognitive Status: Within Functional Limits for tasks assessed                                 General Comments: Slightly tangiental and self limiting.        Exercises      Shoulder Instructions       General Comments VSS    Pertinent Vitals/ Pain       Pain Assessment Pain Assessment: Faces Faces Pain Scale: Hurts even more Pain Location: sternal incision Pain Descriptors / Indicators: Discomfort, Grimacing, Operative site guarding Pain Intervention(s): Monitored during session, Limited activity within patient's tolerance, Repositioned  Home Living                                          Prior Functioning/Environment              Frequency  Min 2X/week        Progress Toward Goals  OT Goals(current goals can now be found in the care plan section)  Progress towards OT goals: Progressing toward goals  Acute Rehab OT Goals OT Goal Formulation: With patient Time For Goal Achievement: 05/06/22 Potential to Achieve Goals: Good ADL Goals Pt Will Perform Grooming: with modified independence;standing Pt Will Perform Lower Body Bathing: with modified independence;sit to/from stand Pt Will Perform Lower Body Dressing: with modified independence;sit to/from stand Pt Will Perform Tub/Shower Transfer: Tub transfer;with supervision;rolling walker;shower seat;ambulating Additional ADL Goal #1: Pt will walk to bathroom with rollator and complete all toileting on 3:1 over commode with mod I.  Plan Discharge plan remains appropriate    Co-evaluation                 AM-PAC OT "6 Clicks" Daily Activity     Outcome Measure   Help from another person eating meals?: None Help from another person taking care of personal grooming?: None Help from another person toileting, which includes using toliet, bedpan, or urinal?: A Little Help from another person  bathing (including washing, rinsing, drying)?: A Little Help from another person to put on and taking off regular upper body clothing?: A Little Help from another person to put on and taking off regular lower body clothing?: A Little 6 Click Score: 20    End of Session    OT Visit Diagnosis: Unsteadiness on feet (R26.81)   Activity Tolerance Patient tolerated treatment well   Patient Left in bed;with call bell/phone within reach;with bed alarm set   Nurse Communication Mobility status        Time: 8469-6295 OT Time Calculation (min): 13 min  Charges: OT General Charges $OT Visit: 1 Visit OT Treatments $Self Care/Home Management : 8-22 mins  [place], OTR/L Acute Rehab Pager: [phone] Office: [place] 04/26/2022, 4:48 PM

## 2022-04-26 NOTE — TOC Progression Note (Addendum)
Transition of Care [facility]) - Progression Note    Patient Details  Name: [name] MRN: [id number] Date of Birth: 1961/08/07  Transition of Care [facility]) CM/SW [place], [place] Phone Number: 04/26/2022, 11:17 AM  Clinical Narrative:    CSW notified that pt is medically ready at this time. CSW provided bed offers and pt will review. PT will need to see pt again prior to authorization initiation. CSW will follow up w/ pt on bed choice and authorization. TOC will continue to follow for DC needs.  2:55 Pt has chosen [facility]. CSW will initiate auth when PT OT updates are available.  3:45 Authorization started Expected Discharge Plan: [place] Barriers to Discharge: Continued Medical Work up, Ship broker, SNF Pending bed offer  Expected Discharge Plan and Services Expected Discharge Plan: [place] In-house Referral: Clinical Social Work     Living arrangements for the past 2 months: Single Family Home                                       Social Determinants of Health (SDOH) Interventions    Readmission Risk Interventions     View : No data to display.

## 2022-04-26 NOTE — Progress Notes (Signed)
Mobility Specialist: Progress Note   04/26/22 1500  Mobility  Activity Ambulated with assistance in hallway  Level of Assistance Standby assist, set-up cues, supervision of patient - no hands on  Assistive Device Four wheel walker  Distance Ambulated (ft) 340 ft (170'x2)  Activity Response Tolerated well  $Mobility charge 1 Mobility   Pre-Mobility: 76 HR Post-Mobility: 77 HR, 136/86 (93) BP, 92% SpO2  Pt received in the bed and agreeable to ambulation. Mod I for bed mobility and SB for ambulation. Stopped x1 for seated break secondary to fatigue and SOB. Pt back to bed after session with call bell and phone in reach.  [facility] Caitlin Ainley Mobility Specialist Mobility Specialist 5 North: [phone] Mobility Specialist 6 North: [phone]

## 2022-04-26 NOTE — Progress Notes (Signed)
CARDIAC REHAB PHASE I   PRE:  Rate/Rhythm: 73 NSR  BP:  Sitting: 125/74      SaO2: 93 RA  MODE:  Ambulation: 350 ft   POST:  Rate/Rhythm: 80 NSR  BP:  Sitting: 92/72      SaO2: 96 RA   Seen pt from 1001-1050, pt was eager to be ambulated. Pt needed moderate-maximal assistance getting out of bed. Pt had posterior lean when standing up due to narrow spacing of feet. Gait belt was used during ambulation with rollator, pt gait was slow, narrow, and unsteady when first walking but got better upon encouragement. Pt took one break to gather herself and get a hold of SOB. Pt need moderate assistance getting to the restroom due to unsteadiness. Reinforced pt education and smoking cessation.    [name]  10:41 AM 04/26/2022

## 2022-04-26 NOTE — Care Management Important Message (Signed)
Important Message  Patient Details  Name: [name] MRN: [id number] Date of Birth: 1961-08-14   Medicare Important Message Given:  Yes     [name] 04/26/2022, 7:48 AM

## 2022-04-26 NOTE — Progress Notes (Addendum)
[address]            [place] [address]          [phone]   7 Days Post-Op Procedure(s) (LRB): MITRAL VALVE REPAIR WITH 30MM PHYSIO II ANNULOPLASTY RING (N/A) TRICUSPID VALVE REPAIR WITH 32MM MC3 ANNULOPLASTY RING (N/A) TRANSESOPHAGEAL ECHOCARDIOGRAM (TEE) (N/A)  Total Length of Stay:  LOS: 7 days   Subjective:  Concerned that she didn't have as much help with walking over the weekend. Otherwise no new problems.   Objective: Vital signs in last 24 hours: Temp:  [98 F (36.7 C)-98.4 F (36.9 C)] 98.4 F (36.9 C) (05/22 0400) Pulse Rate:  [57-71] 66 (05/22 0400) Cardiac Rhythm: Junctional rhythm (05/21 1955) Resp:  [16-21] 17 (05/22 0400) BP: (101-131)/(59-81) 127/62 (05/22 0400) SpO2:  [94 %-100 %] 100 % (05/22 0400) Weight:  [94.2 kg] 94.2 kg (05/22 0422)  Filed Weights   04/24/22 0601 04/25/22 0416 04/26/22 0422  Weight: 96.3 kg 94.5 kg 94.2 kg    Weight change: -0.284 kg     Intake/Output from previous day: 05/21 0701 - 05/22 0700 In: 240 [P.O.:240] Out: 1400 [Urine:1400]  Intake/Output this shift: No intake/output data recorded.  Current Meds: Scheduled Meds:  atorvastatin  20 mg Oral Daily   baclofen  10 mg Oral BID   buPROPion  300 mg Oral q AM   docusate sodium  200 mg Oral Daily   estrogens (conjugated)  0.45 mg Oral Daily   ferrous [id number] C-folic acid  1 capsule Oral BID PC   furosemide  40 mg Oral Daily   gabapentin  600 mg Oral TID   hydrOXYzine  25 mg Oral QID   insulin aspart  0-15 Units Subcutaneous TID WC   linagliptin  5 mg Oral q AM   losartan  100 mg Oral q AM   methocarbamol  500 mg Oral QID   metoprolol tartrate  25 mg Oral BID   pantoprazole  40 mg Oral Daily   sodium chloride flush  3 mL Intravenous Q12H   warfarin  2.5 mg Oral Q M,W,F,Su-1800   warfarin  5 mg Oral Q T,Th,Sat-1800   Warfarin - Physician Dosing Inpatient   Does not apply q1600   Continuous Infusions:   sodium chloride     PRN Meds:.sodium chloride, acetaminophen, albuterol, lactulose, ondansetron **OR** ondansetron (ZOFRAN) IV, oxyCODONE, sodium chloride flush, traMADol, traZODone  General appearance: alert, cooperative, and no distress Neurologic: intact Heart: stable SR  Lungs: breath sounds clear.  Abdomen: soft, non-tender.    Extremities: no peripheral edema Wound: the sternotomy is well approximated and dry.   Lab Results: CBC: Recent Labs    04/24/22 0119  WBC 6.4  HGB 7.7*  HCT 22.4*  PLT 109*    BMET:  Recent Labs    04/24/22 0119 04/25/22 0336  NA 133* 134*  K 5.0 4.5  CL 99 101  CO2 24 25  GLUCOSE 108* 127*  BUN 15 18  CREATININE 1.06* 1.09*  CALCIUM 8.3* 8.2*     CMET: Lab Results  Component Value Date   WBC 6.4 04/24/2022   HGB 7.7 (L) 04/24/2022   HCT 22.4 (L) 04/24/2022   PLT 109 (L) 04/24/2022   GLUCOSE 127 (H) 04/25/2022   CHOL 180 03/25/2016   TRIG 122 03/25/2016   HDL 39 (L) 03/25/2016   LDLCALC 117 03/25/2016  ALT 14 04/15/2022   AST 22 04/15/2022   NA 134 (L) 04/25/2022   K 4.5 04/25/2022   CL 101 04/25/2022   CREATININE 1.09 (H) 04/25/2022   BUN 18 04/25/2022   CO2 25 04/25/2022   TSH 1.75 03/25/2016   INR 2.0 (H) 04/26/2022   HGBA1C 6.1 (H) 04/15/2022   MICROALBUR 0.50 06/19/2010      PT/INR:  Recent Labs    04/26/22 0409  LABPROT 22.5*  INR 2.0*    Radiology: No results found.   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  Cardiac rhythm stable, BP stable. Progressing with mobility. INR 2.0, continue Coumadin 2.'5mg'$  / '5mg'$  alternating dosing.   -HEME-last Hct 22% Continue iron supplement.  Thrombocytopenia resolevd.  -ENDO-Type 2 DM- A1C 6.1.  Good control on current regimen.   -Pulm- good sats on RA,  working on pulm hygiene.   -DVT PPX- anticoagulated on Coumadin, ambulate  -RENAL- normal function pre-op with small increase in Creat post-op now trending back down. Wt below pre-op.  -H/O chronic pain syndrome- gabapentin and Wellbutrin have been resumed and pain control is fair.  -Disposition- Patient does not have support at home. TOC team working on SNF placement. Ready for discharge when bed available.    Antony Odea, PA-C 04/26/2022 7:30 AM  Patient seen and examined, agree with above To SNF once arrangements are complete  Remo Lipps C. Roxan Hockey, MD [facility] [phone]

## 2022-04-27 DIAGNOSIS — I340 Nonrheumatic mitral (valve) insufficiency: Secondary | ICD-10-CM

## 2022-04-27 LAB — PROTIME-INR
INR: 2.2 — ABNORMAL HIGH (ref 0.8–1.2)
Prothrombin Time: 24 seconds — ABNORMAL HIGH (ref 11.4–15.2)

## 2022-04-27 LAB — GLUCOSE, CAPILLARY
Glucose-Capillary: 105 mg/dL — ABNORMAL HIGH (ref 70–99)
Glucose-Capillary: 132 mg/dL — ABNORMAL HIGH (ref 70–99)
Glucose-Capillary: 130 mg/dL — ABNORMAL HIGH (ref 70–99)
Glucose-Capillary: 101 mg/dL — ABNORMAL HIGH (ref 70–99)
Glucose-Capillary: 136 mg/dL — ABNORMAL HIGH (ref 70–99)

## 2022-04-27 LAB — BASIC METABOLIC PANEL
Anion gap: 8 (ref 5–15)
BUN: 14 mg/dL (ref 8–23)
CO2: 25 mmol/L (ref 22–32)
Calcium: 8.6 mg/dL — ABNORMAL LOW (ref 8.9–10.3)
Chloride: 102 mmol/L (ref 98–111)
Creatinine, Ser: 1.16 mg/dL — ABNORMAL HIGH (ref 0.44–1.00)
GFR, Estimated: 53 mL/min — ABNORMAL LOW (ref >60)
Glucose, Bld: 130 mg/dL — ABNORMAL HIGH (ref 70–99)
Potassium: 4.5 mmol/L (ref 3.5–5.1)
Sodium: 135 mmol/L (ref 135–145)

## 2022-04-27 LAB — MAGNESIUM: Magnesium: 1.9 mg/dL (ref 1.7–2.4)

## 2022-04-27 MED ORDER — ACETAMINOPHEN 500 MG PO TABS
1000.0000 mg | ORAL_TABLET | Freq: Four times a day (QID) | ORAL | Status: DC | PRN
  Administered 2022-04-27: 1000 mg via ORAL
  Filled 2022-04-27: qty 2

## 2022-04-27 NOTE — Consult Note (Addendum)
Cardiology Consultation:   Patient ID: [name] MRN: [id number]; DOB: October 10, 1961  Admit date: 04/19/2022 Date of Consult: 04/27/2022  PCP:  [facility] [place] [facility] Cardiologist:  Werner Lean, MD   Patient Profile:   [name] is a 61 y.o. female with a hx of hypertension, diabetes, fibromyalgia, history of hep C s/p therapy, distant substance abuse, tobacco abuse, PAF (?2018 with prior xarelto), severe MR and TR confirmed on TEE with abnormal inflammatory/rheumatic valves now s/p MVR and TV annuloplasty who is being seen 04/27/2022 for the evaluation of postoperative atrial fibrillation at the request of Dr. Roxan Hockey.  History of Present Illness:   [name] establish care with Dr. Gasper Sells for evaluation of severe mitral regurgitation and tricuspid regurgitation.  She does not have a history of endocarditis.  Preoperative heart catheterization showed widely patent coronaries without obstructive disease and mild pulmonary hypertension WHO group 2.  She was referred to CT surgery and ultimately underwent mitral valve repair and tricuspid valve repair on 04/19/2022.  Immediately following surgery she was in a junctional rhythm and required temporary pacing.  She converted to atrial fibrillation.  She is on Coumadin therapy s/p MVR, INR 2.2 today.  Cardiology was consulted for recommendations for antiarrhythmic.  Of note intraoperative TEE did not show thrombus.  Chart review shows she carries a history of PAF and was on cardizem and xarelto in 2018. I do not see prior cardiology notes.   During my interview, she does not remember details regarding her PAF in 2018. She does remember being on xarelto and requested to be taken off of it for unclear reasons. She denies history of bleeding, but doesn't remember details. I discussed her telemetry this admission following valve surgery. She is asymptomatic, rate controlled, and  hemodynamically stable. I discussed anticoagulation - coumadin s/p MV repair.   Per RN, she has been having daily headaches, but unclear if this is related to a certain medication.     Past Medical History:  Diagnosis Date   Arthritis    Cervicalgia    Chondromalacia of patella    Chronic neck pain    Chronic pain syndrome    Depression    secondary to loss of her son at age 6   Diabetes mellitus    Diabetic neuropathy ([place])    DMII (diabetes mellitus, type 2) ([place])    Enthesopathy of hip region    Fibromyalgia    Hep C w/o coma, chronic ([place])    Hepatitis C    diagnosed 2005   Hypertension    Insomnia    Insomnia    Low back pain    Lumbago    Mitral regurgitation    Obesity    Pain in joint, upper arm    Primary localized osteoarthrosis, lower leg    Substance abuse ([place])    sober since 2001   Tobacco abuse    Tricuspid regurgitation    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    Past Surgical History:  Procedure Laterality Date   ABDOMINAL HYSTERECTOMY     at age 21, unknown reasons   CARPAL TUNNEL RELEASE  12/07/2011   left side   EYE SURGERY     FOOT SURGERY Bilateral    MITRAL VALVE REPAIR N/A 04/19/2022   Procedure: MITRAL VALVE REPAIR WITH 30MM PHYSIO II ANNULOPLASTY RING;  Surgeon: Melrose Nakayama, MD;  Location: [facility];  Service: Open Heart Surgery;  Laterality: N/A;   RIGHT/LEFT HEART  CATH AND CORONARY ANGIOGRAPHY N/A 02/12/2022   Procedure: RIGHT/LEFT HEART CATH AND CORONARY ANGIOGRAPHY;  Surgeon: Belva Crome, MD;  Location: [facility];  Service: Cardiovascular;  Laterality: N/A;   TEE WITHOUT CARDIOVERSION N/A 02/03/2022   Procedure: TRANSESOPHAGEAL ECHOCARDIOGRAM (TEE);  Surgeon: Josue Hector, MD;  Location: [facility] ENDOSCOPY;  Service: Cardiovascular;  Laterality: N/A;   TEE WITHOUT CARDIOVERSION N/A 04/19/2022   Procedure: TRANSESOPHAGEAL ECHOCARDIOGRAM (TEE);  Surgeon: Melrose Nakayama, MD;  Location: [facility];  Service: Open  Heart Surgery;  Laterality: N/A;   TRICUSPID VALVE REPLACEMENT N/A 04/19/2022   Procedure: TRICUSPID VALVE REPAIR WITH 32MM MC3 ANNULOPLASTY RING;  Surgeon: Melrose Nakayama, MD;  Location: [facility];  Service: Open Heart Surgery;  Laterality: N/A;     Home Medications:  Prior to Admission medications   Medication Sig Start Date End Date Taking? Authorizing Provider  albuterol (PROVENTIL HFA;VENTOLIN HFA) 108 (90 Base) MCG/ACT inhaler Inhale 2 puffs into the lungs every 6 (six) hours as needed for wheezing or shortness of breath.    Yes [provider]  aspirin 81 MG EC tablet Take 81 mg by mouth daily.   Yes [provider]  atorvastatin (LIPITOR) 20 MG tablet Take 20 mg by mouth daily.   Yes [provider]  baclofen (LIORESAL) 10 MG tablet Take 10 mg by mouth 2 (two) times daily.   Yes [provider]  BIOTIN PO Take 1 tablet by mouth daily.   Yes [provider]  buPROPion (WELLBUTRIN XL) 300 MG 24 hr tablet Take 300 mg by mouth in the morning. 03/24/22  Yes [provider]  Camphor-Menthol-Methyl Sal (SALONPAS) 3.12-11-08 % PTCH Place 1 patch onto the skin daily as needed (pain.).   Yes [provider]  Cyanocobalamin (VITAMIN B 12 PO) Take 50 mcg by mouth.   Yes [provider]  dexlansoprazole (DEXILANT) 60 MG capsule Take 60 mg by mouth daily. 12/18/21  Yes [provider]  diclofenac Sodium (VOLTAREN) 1 % GEL 2 g 4 (four) times daily as needed (pain). 03/16/22  Yes [provider]  furosemide (LASIX) 40 MG tablet Take 1 tablet (40 mg total) by mouth 2 (two) times daily. 02/12/22  Yes Chandrasekhar, Mahesh A, MD  gabapentin (NEURONTIN) 600 MG tablet Take 600 mg by mouth 3 (three) times daily. 10/12/21  Yes [provider]  hydrOXYzine (ATARAX/VISTARIL) 25 MG tablet Take 25 mg by mouth in the morning, at noon, in the evening, and at bedtime. 12/21/18  Yes [provider]  Lifitegrast  Shirley Friar) 5 % SOLN Place 1 drop into both eyes in the morning and at bedtime.   Yes [provider]  linagliptin (TRADJENTA) 5 MG TABS tablet Take 5 mg by mouth in the morning.   Yes [provider]  losartan (COZAAR) 100 MG tablet Take 100 mg by mouth in the morning. 09/14/20  Yes [provider]  lubiprostone (AMITIZA) 24 MCG capsule Take 24 mcg by mouth daily as needed for constipation.   Yes [provider]  meloxicam (MOBIC) 15 MG tablet Take 15 mg by mouth daily. 02/05/22  Yes [provider]  methocarbamol (ROBAXIN) 500 MG tablet Take 500 mg by mouth 4 (four) times daily.   Yes [provider]  metoprolol tartrate (LOPRESSOR) 100 MG tablet Take 100 mg by mouth daily.   Yes [provider]  naloxone (NARCAN) nasal spray 4 mg/0.1 mL Place 1 spray into the nose once.   Yes  [provider]  omeprazole (PRILOSEC) 10 MG capsule Take 10 mg by mouth in the morning.   Yes [provider]  oxyCODONE (ROXICODONE) 15 MG immediate release tablet Take 1 tablet (15 mg total) by mouth every 4 (four) hours as needed for pain. Patient taking differently: Take 15 mg by mouth 2 (two) times daily as needed for pain. 04/27/19  Yes Tegeler, Gwenyth Allegra, MD  PREMARIN 0.45 MG tablet Take 0.45 mg by mouth daily. 01/11/22  Yes [provider]  spironolactone (ALDACTONE) 25 MG tablet Take 0.5 tablets (12.5 mg total) by mouth daily. 12/30/21  Yes Chandrasekhar, Mahesh A, MD  traZODone (DESYREL) 100 MG tablet Take 2.5 tablets (250 mg total) by mouth at bedtime as needed for sleep. Patient taking differently: Take 200 mg by mouth at bedtime. 11/23/18  Yes [name], MD  ACCU-CHEK FASTCLIX LANCETS MISC 1 each by Does not apply route daily. Check blood sugar once daily. 250.00 10/26/11   Lane Hacker L, DO  glucose blood (ACCU-CHEK SMARTVIEW) test strip Check blood sugar once daily. 250.00 10/26/11 12/30/21  [name],  DO    Inpatient Medications: Scheduled Meds:  atorvastatin  20 mg Oral Daily   baclofen  10 mg Oral BID   buPROPion  300 mg Oral q AM   docusate sodium  200 mg Oral Daily   estrogens (conjugated)  0.45 mg Oral Daily   ferrous [id number] C-folic acid  1 capsule Oral BID PC   furosemide  40 mg Oral Daily   gabapentin  600 mg Oral TID   hydrOXYzine  25 mg Oral QID   insulin aspart  0-15 Units Subcutaneous TID WC   linagliptin  5 mg Oral q AM   losartan  100 mg Oral q AM   methocarbamol  500 mg Oral QID   metoprolol tartrate  25 mg Oral BID   pantoprazole  40 mg Oral Daily   sodium chloride flush  3 mL Intravenous Q12H   warfarin  2.5 mg Oral Q M,W,F,Su-1800   warfarin  5 mg Oral Q T,Th,Sat-1800   Warfarin - Physician Dosing Inpatient   Does not apply q1600   Continuous Infusions:  sodium chloride     PRN Meds: sodium chloride, acetaminophen, albuterol, lactulose, ondansetron **OR** ondansetron (ZOFRAN) IV, oxyCODONE, sodium chloride flush, traZODone  Allergies:    Allergies  Allergen Reactions   Hydrocodone-Acetaminophen Itching   Hydrocodone Itching    Tolerates oxycodone     Social History:   Social History   Socioeconomic History   Marital status: Single    Spouse name: Not on file   Number of children: 2   Years of education: Not on file   Highest education level: Not on file  Occupational History   Occupation: CNA    Comment: worked for 15 years  Tobacco Use   Smoking status: Every Day    Packs/day: 0.50    Years: 35.00    Pack years: 17.50    Types: Cigarettes   Smokeless tobacco: Never   Tobacco comments:    4 cigarettes a day  Vaping Use   Vaping Use: Some days  Substance and Sexual Activity   Alcohol use: No   Drug use: No    Types: "Crack" cocaine    Comment: last used Oct 2016   Sexual activity: Not Currently    Comment: Celebate since 2000  Other Topics Concern   Not on file  Social History Narrative   Born and raised  by mom  until mom died when she was 14yo. After that she stayed with her aunt. Dad was married to someone else and was never around.  Pt has 1 brother and 1 sister and pt is the middle child. Pt had a very rough childhood. Never married. 2 kids one son died several  yrs ago and other son has Schizophrenia. Pt has graduated HS and has some college. Pt is on disability and is unemployed. 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       Financial assistance approved for 100% discount at [facility] and has [facility] card   [employer]  September 29, 2010 2:27 PM   Social Determinants of Health   Financial Resource Strain: Not on file  Food Insecurity: Not on file  Transportation Needs: Not on file  Physical Activity: Not on file  Stress: Not on file  Social Connections: Not on file  Intimate Partner Violence: Not on file    Family History:    Family History  Problem Relation Age of Onset   Uterine cancer Mother    Alcohol abuse Father    Alcohol abuse Sister    Drug abuse Sister    Drug abuse Brother    Alcohol abuse Brother    Schizophrenia Son    Diabetes Other    Arthritis Other    Hypertension Other    Heart attack Son 20       Died suddenly     ROS:  Please see the history of present illness.   All other ROS reviewed and negative.     Physical Exam/Data:   Vitals:   04/27/22 0400 04/27/22 0608 04/27/22 0807 04/27/22 1209  BP: (!) 114/53  134/83 104/70  Pulse: 74  70 85  Resp: '20 18 18 14  '$ Temp:   98.2 F (36.8 C) 98.2 F (36.8 C)  TempSrc: Oral  Oral Oral  SpO2: 90%  90% 96%  Weight:  92.9 kg    Height:        Intake/Output Summary (Last 24 hours) at 04/27/2022 1411 Last data filed at 04/27/2022 1210 Gross per 24 hour  Intake 680 ml  Output 1875 ml  Net -1195 ml      04/27/2022    6:08 AM 04/26/2022    4:22 AM 04/25/2022    4:16 AM  Last 3 Weights  Weight (lbs) 204 lb 12.9 oz 207 lb 10.8 oz 208 lb 4.8 oz  Weight (kg) 92.9 kg 94.2 kg 94.484 kg     Body mass index is 28.56  kg/m.  General:  Well nourished, well developed, in no acute distress HEENT: normal Neck: no JVD Vascular: No carotid bruits; Distal pulses 2+ bilaterally Cardiac:  irregular rhythm, regular rate, breast binder in place Lungs:  clear to auscultation bilaterally, no wheezing, rhonchi or rales  Abd: soft, nontender, no hepatomegaly  Ext: no edema Musculoskeletal:  No deformities, BUE and BLE strength normal and equal Skin: warm and dry  Neuro:  CNs 2-12 intact, no focal abnormalities noted Psych:  Normal affect   EKG:  The EKG was personally reviewed and demonstrates:  atrial flutter with variable block ventricular rate 77 Telemetry:  Telemetry was personally reviewed and demonstrates:  paced rhythm following surgery, sinus rhythm in the 70s, paroxysmal atrial fibrillation / flutter with rates in the 60-80s  Relevant CV Studies:  Heart cath 02/12/22: CONCLUSIONS: Mild pulmonary hypertension with mean PA pressure 31 mmHg.  Mean pulmonary wedge pressure 23 mmHg with  a V wave 41 mmHg.  WHO group 2 pulmonary hypertension. Pulmonary vascular resistance 0.99 Woods units Left ventricular end diastolic pressure 35 mmHg, consistent with diastolic heart failure. Right dominant coronary anatomy.  Widely patent coronaries without obstructive disease. Previously documented severe mitral regurgitation.   RECOMMENDATIONS:   Per Dr. Glenford Bayley who is making plans for referral to CV surgeon. Home later today if no complications. Resume all home medications including p.m. doses of antihypertensive agents.  She did not take any of her medicine this morning and had elevated blood pressure requiring IV hydralazine and labetalol.    TEE 02/03/22:  1. Severe MR ? rheumatic valve Patient may need TV addressed at time of  surgery as well.   2. Left ventricular ejection fraction, by estimation, is 60 to 65%. The  left ventricle has normal function. The left ventricle has no regional  wall motion  abnormalities.   3. Right ventricular systolic function is normal. The right ventricular  size is normal.   4. Left atrial size was mildly dilated. No left atrial/left atrial  appendage thrombus was detected.   5. Thickened leaflets ? 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 Blunted systolic flow in PVls PISA  1 cm2 . The mitral valve is rheumatic. Severe mitral valve regurgitation.  No evidence of mitral stenosis.   6. Thickened leaflets ? rheumatic . The tricuspid valve is abnormal.  Tricuspid valve regurgitation is severe.   7. The aortic valve is normal in structure. Aortic valve regurgitation is  not visualized. No aortic stenosis is present.   8. The inferior vena cava is normal in size with greater than 50%  respiratory variability, suggesting right atrial pressure of 3 mmHg.   Laboratory Data:  High Sensitivity Troponin:  No results for input(s): TROPONINIHS in the last 720 hours.   Chemistry Recent Labs  Lab 04/20/22 1629 04/21/22 0500 04/24/22 0119 04/25/22 0336 04/27/22 0857  NA 137   < > 133* 134* 135  K 4.0   < > 5.0 4.5 4.5  CL 109   < > 99 101 102  CO2 20*   < > '24 25 25  '$ GLUCOSE 137*   < > 108* 127* 130*  BUN 14   < > '15 18 14  '$ CREATININE 1.38*   < > 1.06* 1.09* 1.16*  CALCIUM 8.0*   < > 8.3* 8.2* 8.6*  MG 1.9  --   --   --  1.9  GFRNONAA 43*   < > 59* 57* 53*  ANIONGAP 8   < > '10 8 8   '$ < > = values in this interval not displayed.    No results for input(s): PROT, ALBUMIN, AST, ALT, ALKPHOS, BILITOT in the last 168 hours. Lipids No results for input(s): CHOL, TRIG, HDL, LABVLDL, LDLCALC, CHOLHDL in the last 168 hours.  Hematology Recent Labs  Lab 04/22/22 0557 04/23/22 0131 04/24/22 0119  WBC 7.4 7.0 6.4  RBC 2.35* 2.53* 2.61*  HGB 6.7* 7.5* 7.7*  HCT 20.3* 20.9* 22.4*  MCV 86.4 82.6 85.8  MCH 28.5 29.6 29.5  MCHC 33.0 35.9 34.4  RDW 15.6* 14.8 14.9  PLT 78* 101* 109*   Thyroid No results for  input(s): TSH, FREET4 in the last 168 hours.  BNPNo results for input(s): BNP, PROBNP in the last 168 hours.  DDimer No results for input(s): DDIMER in the last 168 hours.   Radiology/Studies:  No results found.  Assessment and Plan:   Atrial fibrillation / flutter Hx of PAF - hx of PAF previously on cardizem and xarelto - details unknown - BP 90-130s - metoprolol 25 mg BID - telemetry with episodes of Afib, flutter, and sinus rhythm - rate controlled - pt is asymptomatic, rate controlled, and hemodynamically stable - could consider short course of amiodarone with PO load, however pt is hesitant regarding new medications and INR is just now therapeutic - alternatively, can consider reducing losartan and restarting lower dose of cardizem in addition to metoprolol   Need for chronic anticoagulation - for PAF/flutter and s/p MV repair - continue coumadin - INR 2.2 - will need to discuss [place] following coumadin therapy   Severe MR/TR - rheumatic appearing valve - s/p MV repair and TV annuloplasty - per CTS   Hypertension PTA: 12.5 mg spironolactone, metoprolol, losartan 100 mg daily, and 120 mg cardizem - now on 100 mg losartan and metoprolol 25 mg BID  - BP labile and marginal at times   Diabetes  - A1c 6.1% - continue home mediations   Disposition Pt lives alone and slated for SNF - pending approval   Patient seen and examined, note reviewed with the signed Advanced Practice Provider. I personally reviewed laboratory data, imaging studies and relevant notes. I independently examined the patient and formulated the important aspects of the plan. I have personally discussed the plan with the patient and/or family. Comments or changes to the note/plan are indicated below.  Now with postop afib - rate control and asymptomatic.  Starting antiarrhythmic is not unreasonable but will recommend increasing Cardizem to 180 mg daily - which will also help with the uncontrolled  blood pressure.  Was previously on anticoagulation - but stopped unclear why. She is apprehensive about starting this now- and this is not unreasonable.  Since post-op would have the patient follow up, likely monitor in the outpatient setting to assess burden of afib.   Berniece Salines DO, MS [facility] Attending [place]  [address] [place], [place] [address] [phone] Website: [email or web address]    Risk Assessment/Risk Scores:   CHA2DS2-VASc Score = 3   This indicates a 3.2% annual risk of stroke. The patient's score is based upon: CHF History: 0 HTN History: 1 Diabetes History: 1 Stroke History: 0 Vascular Disease History: 0 Age Score: 0 Gender Score: 1        For questions or updates, please contact [place] Please consult [email or web address] for contact info under    Signed, Ledora Bottcher, [place]  04/27/2022 2:11 PM

## 2022-04-27 NOTE — Progress Notes (Signed)
CARDIAC REHAB PHASE I   PRE:  Rate/Rhythm: 86 SR    BP: sitting 112/65    SaO2: 88 RA  MODE:  Ambulation: 220 ft   POST:  Rate/Rhythm: 100 ST    BP: sitting 123/100     SaO2: 97 RA  Pt flat in bed, SaO2 low. Moved to EOB and to [facility], stood stronger today with rocking and min assist. Slow and steady pace in hall, more fatigued. She had LOB toward end of walk, sts her legs feel weak in general. Return to bed for nap, c/o HA. SaO2 much improved with walking. Encouraged another walk or atleast recliner. Also encouraged IS.  [id number]   [place], ACSM 04/27/2022 3:01 PM

## 2022-04-27 NOTE — Progress Notes (Signed)
Mobility Specialist: Progress Note   04/27/22 1235  Mobility  Activity Ambulated with assistance in hallway  Level of Assistance Modified independent, requires aide device or extra time  Assistive Device Four wheel walker  Distance Ambulated (ft) 340 ft (170'x2)  Activity Response Tolerated well  $Mobility charge 1 Mobility   Pre-Mobility: 86 HR, 104/70 (81) BP, 96% SpO2 Post-Mobility: 90 HR, 108/73 (85) BP, 96% SpO2  Pt received in the chair and agreeable to ambulation. Pt noted to have more coughing spells during session today. Stopped x1 for seated break secondary to BLE fatigue and SOB. Pt to BR after returning to the room and then back to bed per request. Call bell and phone at her side.   [facility] [name] Mobility Specialist Mobility Specialist 5 North: [phone] Mobility Specialist 6 North: [phone]

## 2022-04-27 NOTE — Progress Notes (Addendum)
[address]            [place] [address]          [phone]   8 Days Post-Op Procedure(s) (LRB): MITRAL VALVE REPAIR WITH 30MM PHYSIO II ANNULOPLASTY RING (N/A) TRICUSPID VALVE REPAIR WITH 32MM MC3 ANNULOPLASTY RING (N/A) TRANSESOPHAGEAL ECHOCARDIOGRAM (TEE) (N/A)  Total Length of Stay:  LOS: 8 days   Subjective:  Resting in bed, feels good. BM yesterday, has no concerns.   Monitor showing new a-fib this morning with VR in the 70's.  Objective: Vital signs in last 24 hours: Temp:  [98 F (36.7 C)-98.3 F (36.8 C)] 98.2 F (36.8 C) (05/23 0359) Pulse Rate:  [73-83] 74 (05/23 0400) Cardiac Rhythm: Normal sinus rhythm (05/23 0400) Resp:  [18-23] 18 (05/23 0608) BP: (99-131)/(53-73) 114/53 (05/23 0400) SpO2:  [90 %-97 %] 90 % (05/23 0400) Weight:  [92.9 kg] 92.9 kg (05/23 0608)  Filed Weights   04/25/22 0416 04/26/22 0422 04/27/22 0350  Weight: 94.5 kg 94.2 kg 92.9 kg    Weight change: -1.3 kg     Intake/Output from previous day: 05/22 0701 - 05/23 0700 In: 240 [P.O.:240] Out: 2375 [Urine:2375]  Intake/Output this shift: No intake/output data recorded.  Current Meds: Scheduled Meds:  atorvastatin  20 mg Oral Daily   baclofen  10 mg Oral BID   buPROPion  300 mg Oral q AM   docusate sodium  200 mg Oral Daily   estrogens (conjugated)  0.45 mg Oral Daily   ferrous [id number] C-folic acid  1 capsule Oral BID PC   furosemide  40 mg Oral Daily   gabapentin  600 mg Oral TID   hydrOXYzine  25 mg Oral QID   insulin aspart  0-15 Units Subcutaneous TID WC   linagliptin  5 mg Oral q AM   losartan  100 mg Oral q AM   methocarbamol  500 mg Oral QID   metoprolol tartrate  25 mg Oral BID   pantoprazole  40 mg Oral Daily   sodium chloride flush  3 mL Intravenous Q12H   warfarin  2.5 mg Oral Q M,W,F,Su-1800   warfarin  5 mg Oral Q T,Th,Sat-1800   Warfarin - Physician Dosing Inpatient   Does not apply q1600   Continuous  Infusions:  sodium chloride     PRN Meds:.sodium chloride, acetaminophen, albuterol, lactulose, ondansetron **OR** ondansetron (ZOFRAN) IV, oxyCODONE, sodium chloride flush, traMADol, traZODone  General appearance: alert, cooperative, and no distress Neurologic: intact Heart: irregularly irregular rhythm, 70's. Lungs: breath sounds clear.  Abdomen: soft, non-tender.    Extremities: no peripheral edema Wound: the sternotomy is well approximated and dry.   Lab Results: CBC: No results for input(s): WBC, HGB, HCT, PLT in the last 72 hours.  BMET:  Recent Labs    04/25/22 0336  NA 134*  K 4.5  CL 101  CO2 25  GLUCOSE 127*  BUN 18  CREATININE 1.09*  CALCIUM 8.2*     CMET: Lab Results  Component Value Date   WBC 6.4 04/24/2022   HGB 7.7 (L) 04/24/2022   HCT 22.4 (L) 04/24/2022   PLT 109 (L) 04/24/2022   GLUCOSE 127 (H) 04/25/2022   CHOL 180 03/25/2016   TRIG 122 03/25/2016   HDL 39 (L) 03/25/2016   LDLCALC 117 03/25/2016   ALT 14 04/15/2022   AST 22 04/15/2022   NA  134 (L) 04/25/2022   K 4.5 04/25/2022   CL 101 04/25/2022   CREATININE 1.09 (H) 04/25/2022   BUN 18 04/25/2022   CO2 25 04/25/2022   TSH 1.75 03/25/2016   INR 2.2 (H) 04/27/2022   HGBA1C 6.1 (H) 04/15/2022   MICROALBUR 0.50 06/19/2010      PT/INR:  Recent Labs    04/27/22 0500  LABPROT 24.0*  INR 2.2*    Radiology: No results found.   Assessment/Plan: S/P Procedure(s) (LRB): MITRAL VALVE REPAIR WITH 30MM PHYSIO II ANNULOPLASTY RING (N/A) TRICUSPID VALVE REPAIR WITH 32MM MC3 ANNULOPLASTY RING (N/A) TRANSESOPHAGEAL ECHOCARDIOGRAM (TEE) (N/A)  -POD8 MV and TV repair for severe MR/TR.   BP stable. INR 2.2, continue Coumadin 2.'5mg'$  / '5mg'$  alternating dosing.   -A-fib- has h/o PAF. She was bradycardic immediately post-op requiring pacing but has been in SR since POD2. She is currently rate-controlled, has stable BP and is anticoagulated. Will discuss with cardiology before adding an  anti-arrhythmic.   Check Mg++ and K+.  -HEME-last Hct 22% Continue iron supplement.  Thrombocytopenia resolved.  -ENDO-Type 2 DM- A1C 6.1.  Good control on current regimen.   -Pulm- good sats on RA  -DVT PPX- anticoagulated on Coumadin, ambulate  -RENAL- normal function pre-op with small increase in Creat post-op now trending back down. Wt below pre-op.  -H/O chronic pain syndrome- gabapentin and Wellbutrin have been resumed and pain control is fair.  -Disposition- Patient does not have support at home. She has accepted a be offer at [facility] SN[facility], awaiting insurance approval.    Antony Odea, PA-C [phone] 04/27/2022 7:29 AM  C/o pain. Headache with tramadol- will dc. She is on oxycodone 15 mg Q4, gabapentin 600 mg TID, and has PRN orders for baclofen and Robaxin  Irregular rhythm this AM, likely A fib/flutter with controlled V response in 70s Will check 12 lead Had junctional rhythm coming out of OR but has been in SR for awhile now. She is anticoagulated.  Will d/w Cardiology whether to try ot convert or just continue with rate control.  Revonda Standard Roxan Hockey, MD [facility] [phone]

## 2022-04-27 NOTE — TOC Progression Note (Addendum)
Transition of Care [facility]) - Progression Note    Patient Details  Name: [name] MRN: [id number] Date of Birth: [birth year]  Transition of Care [facility]) CM/SW Contact  Coralee Pesa, [place] Phone Number: 04/27/2022, 12:50 PM  Clinical Narrative:    [id number] 04/27/2022-04/29/2022  CSW notified by Everlene Balls they will approve SNF stay. Details not in portal yet. Plan to DC tomorrow. TOC will continue to follow for DC needs. CSW noted pt [name] is still pending. Pt with extensive improvement, concern is for denial. Pt to stay one more day. Will continue to follow for DC needs.  Expected Discharge Plan: [place] Barriers to Discharge: Continued Medical Work up, Ship broker, SNF Pending bed offer  Expected Discharge Plan and Services Expected Discharge Plan: [place] In-house Referral: Clinical Social Work     Living arrangements for the past 2 months: Single Family Home                                       Social Determinants of Health (SDOH) Interventions    Readmission Risk Interventions     View : No data to display.

## 2022-04-28 LAB — GLUCOSE, CAPILLARY
Glucose-Capillary: 134 mg/dL — ABNORMAL HIGH (ref 70–99)
Glucose-Capillary: 99 mg/dL (ref 70–99)
Glucose-Capillary: 154 mg/dL — ABNORMAL HIGH (ref 70–99)

## 2022-04-28 LAB — PROTIME-INR
INR: 2.5 — ABNORMAL HIGH (ref 0.8–1.2)
Prothrombin Time: 26.8 seconds — ABNORMAL HIGH (ref 11.4–15.2)

## 2022-04-28 MED ORDER — LOSARTAN POTASSIUM 50 MG PO TABS
50.0000 mg | ORAL_TABLET | Freq: Every morning | ORAL | Status: DC
Start: 2022-04-29 — End: 2022-04-29

## 2022-04-28 MED ORDER — DILTIAZEM HCL ER COATED BEADS 120 MG PO CP24
120.0000 mg | ORAL_CAPSULE | Freq: Every day | ORAL | Status: DC
  Administered 2022-04-28: 120 mg via ORAL
  Filled 2022-04-28: qty 1

## 2022-04-28 MED ORDER — OXYCODONE HCL 15 MG PO TABS: 15.0000 mg | ORAL_TABLET | Freq: Four times a day (QID) | ORAL | 0 refills | Status: AC | PRN

## 2022-04-28 MED ORDER — FE FUMARATE-B12-VIT C-FA-IFC PO CAPS: 1.0000 | ORAL_CAPSULE | Freq: Two times a day (BID) | ORAL | Status: DC

## 2022-04-28 MED ORDER — WARFARIN SODIUM 2.5 MG PO TABS: 2.5000 mg | ORAL_TABLET | ORAL | Status: DC

## 2022-04-28 MED ORDER — METOPROLOL TARTRATE 25 MG PO TABS: 25.0000 mg | ORAL_TABLET | Freq: Two times a day (BID) | ORAL | Status: DC

## 2022-04-28 MED ORDER — DILTIAZEM HCL ER COATED BEADS 120 MG PO CP24: 120.0000 mg | ORAL_CAPSULE | Freq: Every day | ORAL | Status: DC

## 2022-04-28 MED ORDER — WARFARIN SODIUM 5 MG PO TABS: 5.0000 mg | ORAL_TABLET | ORAL | Status: DC

## 2022-04-28 MED ORDER — LOSARTAN POTASSIUM 50 MG PO TABS: 50.0000 mg | ORAL_TABLET | Freq: Every morning | ORAL | Status: DC

## 2022-04-28 NOTE — TOC Transition Note (Addendum)
Transition of Care [facility]) - CM/SW Discharge Note   Patient Details  Name: [name] MRN: [id number] Date of Birth: 1961-09-04  Transition of Care [facility]) CM/SW Contact:  Coralee Pesa, [place] Phone Number: 04/28/2022, 1:29 PM   Clinical Narrative:    Pt to be transported to [facility] via [place]. Nurse to call report to [phone].   Final next level of care: Skilled Nursing Facility Barriers to Discharge: Barriers Resolved   Patient Goals and CMS Choice        Discharge Placement              Patient [facility] bed at: [facility] Patient to be transferred to facility by: [facility] Name of family member notified: Patient Patient and family notified of of transfer: 04/28/22  Discharge Plan and Services In-house Referral: Clinical Social Work                                   Social Determinants of Health (SDOH) Interventions     Readmission Risk Interventions     View : No data to display.

## 2022-04-28 NOTE — Progress Notes (Addendum)
Called report to ms. Chester Holstein, RN at [employer]. All d/c AVS and the printed hard copy of oxycodone prescription put in the AVS envelop.   Lavenia Atlas, RN

## 2022-04-28 NOTE — Progress Notes (Signed)
Occupational Therapy Treatment Patient Details Name: [name] MRN: [id number] DOB: 1960/12/25 Today's Date: 04/28/2022   History of present illness Pt is a 61 y.o. female admitted 04/19/22 for TEE, mitral and tricuspid valve repair. PMH includes PAF, Hep C (treated), HTN, DM2, diabetic neuropathy, fibromyalgia, arthritis, chronic neck and low back pain.   OT comments  Patient received in supine and agreeable to OT session. Patient able to get to EOB and ambulated to bathroom with rollator walker and supervision. Patient able to perform toilet hygiene and clothing management without assistance. Transfer training performed in room and patient asked to eat lunch on EOB. Patient expected to discharge to SNF today.    Recommendations for follow up therapy are one component of a multi-disciplinary discharge planning process, led by the attending physician.  Recommendations may be updated based on patient status, additional functional criteria and insurance authorization.    Follow Up Recommendations  Skilled nursing-short term rehab (<3 hours/day)    Assistance Recommended at Discharge Frequent or constant Supervision/Assistance  Patient can return home with the following  A little help with walking and/or transfers;A little help with bathing/dressing/bathroom;Assistance with cooking/housework;Assist for transportation   Equipment Recommendations  None recommended by OT    Recommendations for Other Services      Precautions / Restrictions Precautions Precautions: Fall;Sternal Precaution Comments: continue to review sternal precautions Required Braces or Orthoses: Other Brace Other Brace: pt wearing chest binder for comfort Restrictions Weight Bearing Restrictions: Yes Other Position/Activity Restrictions: sternal precautions       Mobility Bed Mobility Overal bed mobility: Needs Assistance Bed Mobility: Supine to Sit     Supine to sit: Supervision     General bed  mobility comments: supervision to get to EOB, sat on EOB at end of session to eat lunch    Transfers Overall transfer level: Needs assistance Equipment used: Rollator (4 wheels) Transfers: Sit to/from Stand Sit to Stand: Supervision           General transfer comment: able to stand without UE use     Balance Overall balance assessment: Needs assistance Sitting-balance support: No upper extremity supported, Feet supported Sitting balance-Leahy Scale: Good     Standing balance support: No upper extremity supported Standing balance-Leahy Scale: Fair Standing balance comment: can stand at sink for grooming tasks                           ADL either performed or assessed with clinical judgement   ADL Overall ADL's : Needs assistance/impaired     Grooming: Wash/dry hands;Wash/dry face;Supervision/safety;Standing Grooming Details (indicate cue type and reason): performed standing at sink                 Toilet Transfer: Engineer, manufacturing (4 wheels);Comfort height toilet Toilet Transfer Details (indicate cue type and reason): ambulated to bathroom with 4 wheel walker and performed toilet transfer Toileting- Clothing Manipulation and Hygiene: Modified independent;Sitting/lateral lean         General ADL Comments: declined further self care tasks    Extremity/Trunk Assessment              Vision       Perception     Praxis      Cognition Arousal/Alertness: Awake/alert Behavior During Therapy: WFL for tasks assessed/performed Overall Cognitive Status: No family/caregiver present to determine baseline cognitive functioning  General Comments: was able to recall sternal precautions        Exercises      Shoulder Instructions       General Comments pt preparing for d/c to SNF today. pt reluctant to focus on bed mobility training, but pt with flat bed no rails at home; continues to self-limit  with various complaints requiring frequent encouragement to perform tasks as indep as possible    Pertinent Vitals/ Pain       Pain Assessment Pain Assessment: Faces Faces Pain Scale: Hurts little more Pain Location: sternal incision Pain Descriptors / Indicators: Discomfort, Grimacing, Operative site guarding Pain Intervention(s): Monitored during session  Home Living                                          Prior Functioning/Environment              Frequency  Min 2X/week        Progress Toward Goals  OT Goals(current goals can now be found in the care plan section)  Progress towards OT goals: Progressing toward goals  Acute Rehab OT Goals Patient Stated Goal: get better OT Goal Formulation: With patient Time For Goal Achievement: 05/06/22 Potential to Achieve Goals: Good ADL Goals Pt Will Perform Grooming: with modified independence;standing Pt Will Perform Lower Body Bathing: with modified independence;sit to/from stand Pt Will Perform Lower Body Dressing: with modified independence;sit to/from stand Pt Will Perform Tub/Shower Transfer: Tub transfer;with supervision;rolling walker;shower seat;ambulating Additional ADL Goal #1: Pt will walk to bathroom with rollator and complete all toileting on 3:1 over commode with mod I.  Plan Discharge plan remains appropriate    Co-evaluation                 AM-PAC OT "6 Clicks" Daily Activity     Outcome Measure   Help from another person eating meals?: None Help from another person taking care of personal grooming?: None Help from another person toileting, which includes using toliet, bedpan, or urinal?: A Little Help from another person bathing (including washing, rinsing, drying)?: A Little Help from another person to put on and taking off regular upper body clothing?: A Little Help from another person to put on and taking off regular lower body clothing?: A Little 6 Click Score: 20     End of Session Equipment Utilized During Treatment: Rollator (4 wheels)  OT Visit Diagnosis: Unsteadiness on feet (R26.81)   Activity Tolerance Patient tolerated treatment well   Patient Left in bed;with call bell/phone within reach;with family/visitor present   Nurse Communication Mobility status        Time: 7253-6644 OT Time Calculation (min): 12 min  Charges: OT General Charges $OT Visit: 1 Visit OT Treatments $Self Care/Home Management : 8-22 mins  Lodema Hong, [place]  Pager [phone] Office [place] 04/28/2022, 1:17 PM

## 2022-04-28 NOTE — Progress Notes (Signed)
Reviewed with pt IS, sternal precautions, walking and CRPII. Receptive.  [id number] Yves Dill CES, ACSM 10:27 AM 04/28/2022

## 2022-04-28 NOTE — Progress Notes (Addendum)
[address]            [place] [address]          [phone]   9 Days Post-Op Procedure(s) (LRB): MITRAL VALVE REPAIR WITH 30MM PHYSIO II ANNULOPLASTY RING (N/A) TRICUSPID VALVE REPAIR WITH 32MM MC3 ANNULOPLASTY RING (N/A) TRANSESOPHAGEAL ECHOCARDIOGRAM (TEE) (N/A)  Total Length of Stay:  LOS: 9 days   Subjective:  Resting in bed, c/o posterior headache intermittently.   SR alternating with brief episodes of PAF / flutter  Objective: Vital signs in last 24 hours: Temp:  [98 F (36.7 C)-98.4 F (36.9 C)] 98.2 F (36.8 C) (05/24 0743) Pulse Rate:  [76-87] 81 (05/24 0743) Cardiac Rhythm: Normal sinus rhythm (05/23 1932) Resp:  [13-19] 16 (05/24 0743) BP: (104-126)/(59-83) 126/83 (05/24 0743) SpO2:  [90 %-96 %] 92 % (05/24 0743) Weight:  [91.3 kg] 91.3 kg (05/24 0700)  Filed Weights   04/26/22 0422 04/27/22 0608 04/28/22 0700  Weight: 94.2 kg 92.9 kg 91.3 kg    Weight change: -1.6 kg     Intake/Output from previous day: 05/23 0701 - 05/24 0700 In: 680 [P.O.:680] Out: 1900 [Urine:1900]  Intake/Output this shift: Total I/O In: 480 [P.O.:480] Out: -   Current Meds: Scheduled Meds:  atorvastatin  20 mg Oral Daily   baclofen  10 mg Oral BID   buPROPion  300 mg Oral q AM   docusate sodium  200 mg Oral Daily   estrogens (conjugated)  0.45 mg Oral Daily   ferrous [id number] C-folic acid  1 capsule Oral BID PC   furosemide  40 mg Oral Daily   gabapentin  600 mg Oral TID   hydrOXYzine  25 mg Oral QID   insulin aspart  0-15 Units Subcutaneous TID WC   linagliptin  5 mg Oral q AM   losartan  100 mg Oral q AM   methocarbamol  500 mg Oral QID   metoprolol tartrate  25 mg Oral BID   pantoprazole  40 mg Oral Daily   sodium chloride flush  3 mL Intravenous Q12H   warfarin  2.5 mg Oral Q M,W,F,Su-1800   warfarin  5 mg Oral Q T,Th,Sat-1800   Warfarin - Physician Dosing Inpatient   Does not apply q1600   Continuous  Infusions:  sodium chloride     PRN Meds:.sodium chloride, acetaminophen, albuterol, lactulose, ondansetron **OR** ondansetron (ZOFRAN) IV, oxyCODONE, sodium chloride flush, traZODone  General appearance: alert, cooperative, and no distress Neurologic: intact Heart: currently SR in the 70's but had AF with rate of 130 just prior to seeing her this morning. Lungs: breath sounds clear.  Abdomen: soft, non-tender.    Extremities: no peripheral edema Wound: the sternotomy is well approximated and dry.   Lab Results: CBC: No results for input(s): WBC, HGB, HCT, PLT in the last 72 hours.  BMET:  Recent Labs    04/27/22 0857  NA 135  K 4.5  CL 102  CO2 25  GLUCOSE 130*  BUN 14  CREATININE 1.16*  CALCIUM 8.6*     CMET: Lab Results  Component Value Date   WBC 6.4 04/24/2022   HGB 7.7 (L) 04/24/2022   HCT 22.4 (L) 04/24/2022   PLT 109 (L) 04/24/2022   GLUCOSE 130 (H) 04/27/2022   CHOL 180 03/25/2016   TRIG 122 03/25/2016   HDL 39 (L) 03/25/2016   LDLCALC 117 03/25/2016  ALT 14 04/15/2022   AST 22 04/15/2022   NA 135 04/27/2022   K 4.5 04/27/2022   CL 102 04/27/2022   CREATININE 1.16 (H) 04/27/2022   BUN 14 04/27/2022   CO2 25 04/27/2022   TSH 1.75 03/25/2016   INR 2.5 (H) 04/28/2022   HGBA1C 6.1 (H) 04/15/2022   MICROALBUR 0.50 06/19/2010      PT/INR:  Recent Labs    04/28/22 0303  LABPROT 26.8*  INR 2.5*    Radiology: No results found.   Assessment/Plan: S/P Procedure(s) (LRB): MITRAL VALVE REPAIR WITH 30MM PHYSIO II ANNULOPLASTY RING (N/A) TRICUSPID VALVE REPAIR WITH 32MM MC3 ANNULOPLASTY RING (N/A) TRANSESOPHAGEAL ECHOCARDIOGRAM (TEE) (N/A)  -POD9 MV and TV repair for severe MR/TR.   BP stable. INR 2.2, continue Coumadin 2.'5mg'$  / '5mg'$  alternating dosing.   -A-fib- has h/o PAF. She was bradycardic immediately post-op requiring pacing but has been in SR since POD2. She is currently rate-controlled, has stable BP and is anticoagulated.  Mg++ and K+  OK. Seen by cardiology yesterday--increase in Cardiizem dosing recommended but she is only on metoprolol currently. Will try to clarify plan with cardiology this morning.   -HEME-last Hct 22% Continue iron supplement.  Thrombocytopenia resolved.  -ENDO-Type 2 DM- A1C 6.1.  Good control on current regimen.   -Pulm- good sats on RA  -DVT PPX- anticoagulated on Coumadin, ambulate  -RENAL- normal function. Wt below pre-op.  -H/O chronic pain syndrome- gabapentin and Wellbutrin have been resumed and pain control is fair.  -Disposition- Patient does not have support at home. She has accepted a be offer at [facility] SNF, has  insurance approval for transfer today.     Antony Odea, PA-C [phone] 04/28/2022 8:10 AM   Patient seen and examined, agree with above Cardizem 120 mg daily To SNF today  Remo Lipps C. Roxan Hockey, MD [facility] [phone]

## 2022-04-28 NOTE — Progress Notes (Signed)
Mobility Specialist: Progress Note   04/28/22 1418  Mobility  Activity Ambulated with assistance in hallway  Level of Assistance Minimal assist, patient does 75% or more  Assistive Device Four wheel walker  Distance Ambulated (ft) 340 ft (170'x2)  Activity Response Tolerated well  $Mobility charge 1 Mobility   Pre-Mobility: 95 HR Post-Mobility: 85 HR  Pt received in the bed, requesting to use BR and then agreeable to hallway ambulation. Void successful. Stopped x1 for seated break secondary to SOB and BLE fatigue. Pt back to bed after session with call bell and phone in reach. Family present in the room.   [facility] Sheron Tallman Mobility Specialist Mobility Specialist 5 North: [phone] Mobility Specialist 6 North: [phone]

## 2022-04-28 NOTE — Progress Notes (Signed)
Physical Therapy Treatment Patient Details Name: [name] MRN: [id number] DOB: 1961-04-06 Today's Date: 04/28/2022   History of Present Illness Pt is a 60 y.o. female admitted 04/19/22 for TEE, mitral and tricuspid valve repair. PMH includes PAF, Hep C (treated), HTN, DM2, diabetic neuropathy, fibromyalgia, arthritis, chronic neck and low back pain.   PT Comments    Pt progressing with mobility. Today's session focused on bed mobility (with bed flat) and sit<>stand training while maintaining sternal precautions, as well as ambulation for improving strength and activity tolerance. Pt remains limited by generalized weakness, decreased activity tolerance, and impaired balance strategies/postural reactions. Pt preparing for d/c to SNF today since she has no support at home; if to remain admitted, will continue to follow acutely.    Recommendations for follow up therapy are one component of a multi-disciplinary discharge planning process, led by the attending physician.  Recommendations may be updated based on patient status, additional functional criteria and insurance authorization.  Follow Up Recommendations  Skilled nursing-short term rehab (<3 hours/day)      Assistance Recommended at Discharge Intermittent Supervision/Assistance  Patient can return home with the following A little help with bathing/dressing/bathroom;Assistance with cooking/housework;Assist for transportation   Equipment Recommendations  None recommended by PT    Recommendations for Other Services       Precautions / Restrictions Precautions Precautions: Fall;Sternal Precaution Comments: continue to review sternal precautions Required Braces or Orthoses: Other Brace Other Brace: pt wearing chest binder for comfort Restrictions Weight Bearing Restrictions: Yes     Mobility  Bed Mobility Overal bed mobility: Needs Assistance Bed Mobility: Supine to Sit, Rolling, Sidelying to Sit, Sit to  Sidelying Rolling: Supervision Sidelying to sit: Supervision Supine to sit: Min assist, HOB elevated   Sit to sidelying: Supervision General bed mobility comments: pt elevating HOB for ease of getting up secondary to urinary urgency, requiring minA for trunk elevation coming to long sitting from elevated HOB; additional supine<>sit x2 trials with bed flat, educ on log roll technique, pt able to perform without physical assist    Transfers Overall transfer level: Needs assistance Equipment used: Rollator (4 wheels) Transfers: Sit to/from Stand Sit to Stand: Supervision           General transfer comment: pt requesting assist to stand, encouraged to perform as indep as possible and reviewed technique with hand placement (on knees with use of momentum); pt able to perform multiple sit<>stands from EOB, BSC (over toilet) and rollator seat with supervision to ensure maintenance of sternal precautions; pt with good awareness of safety and sequencing with seated rests on rollator seat (i.e. backing against wall for stability, locking brakes)    Ambulation/Gait Ambulation/Gait assistance: Supervision Gait Distance (Feet): 240 Feet (+ 240) Assistive device: Rollator (4 wheels) Gait Pattern/deviations: Step-through pattern, Decreased stride length, Trunk flexed Gait velocity: Decreased     General Gait Details: slow, mostly steady gait with rollator and supervision for safety; 1x seated rest break secondary to fatigue, additional 2x standing rest break with pt c/o feeling dizzy; cues to self-monitor activity tolerance and need for rest breaks   Stairs             Wheelchair Mobility    Modified Rankin (Stroke Patients Only)       Balance Overall balance assessment: Needs assistance Sitting-balance support: No upper extremity supported, Feet supported Sitting balance-Leahy Scale: Good     Standing balance support: No upper extremity supported Standing balance-Leahy Scale:  Fair Standing balance comment: can static stand without  UE support                            Cognition Arousal/Alertness: Awake/alert Behavior During Therapy: WFL for tasks assessed/performed Overall Cognitive Status: No family/caregiver present to determine baseline cognitive functioning                                 General Comments: WFL for majority of tasks; self-limiting regarding pain despite education about post-op course (pt continues to state, "But I don't want to waste the pain medicine because I'll hurt after working with you")        Exercises Other Exercises Other Exercises: Incentive spirometer x10 (pt with good technique; pulling ~1250 mL) - pt reports doing this "whenever I think about it", educ on increasing frequency (10x/hr while awake)    General Comments General comments (skin integrity, edema, etc.): pt preparing for d/c to SNF today. pt reluctant to focus on bed mobility training, but pt with flat bed no rails at home; continues to self-limit with various complaints requiring frequent encouragement to perform tasks as indep as possible      Pertinent Vitals/Pain Pain Assessment Pain Assessment: Faces Faces Pain Scale: Hurts little more Pain Location: sternal incision Pain Descriptors / Indicators: Discomfort, Grimacing, Operative site guarding Pain Intervention(s): Premedicated before session, Monitored during session    Home Living                          Prior Function            PT Goals (current goals can now be found in the care plan section) Progress towards PT goals: Progressing toward goals    Frequency    Min 3X/week      PT Plan Current plan remains appropriate    Co-evaluation              AM-PAC PT "6 Clicks" Mobility   Outcome Measure  Help needed turning from your back to your side while in a flat bed without using bedrails?: A Little Help needed moving from lying on your back to  sitting on the side of a flat bed without using bedrails?: A Little Help needed moving to and from a bed to a chair (including a wheelchair)?: A Little Help needed standing up from a chair using your arms (e.g., wheelchair or bedside chair)?: A Little Help needed to walk in hospital room?: A Little Help needed climbing 3-5 steps with a railing? : A Little 6 Click Score: 18    End of Session Equipment Utilized During Treatment: Gait belt Activity Tolerance: Patient tolerated treatment well Patient left: in bed;with call bell/phone within reach;with bed alarm set Nurse Communication: Mobility status PT Visit Diagnosis: Other abnormalities of gait and mobility (R26.89);Muscle weakness (generalized) (M62.81);Difficulty in walking, not elsewhere classified (R26.2);Pain     Time: 9179-1505 PT Time Calculation (min) (ACUTE ONLY): 25 min  Charges:  $Therapeutic Exercise: 8-22 mins $Therapeutic Activity: 8-22 mins                     Mabeline Caras, PT, DPT Acute Rehabilitation Services  Pager [phone] Office [place] 04/28/2022, 10:02 AM

## 2022-04-28 NOTE — Progress Notes (Addendum)
Progress Note  Patient Name: [name] Date of Encounter: 04/28/2022  [facility] Cardiologist: Werner Lean, MD   Subjective   Remains asymptomatic.   Inpatient Medications    Scheduled Meds:  atorvastatin  20 mg Oral Daily   baclofen  10 mg Oral BID   buPROPion  300 mg Oral q AM   docusate sodium  200 mg Oral Daily   estrogens (conjugated)  0.45 mg Oral Daily   ferrous [id number] C-folic acid  1 capsule Oral BID PC   furosemide  40 mg Oral Daily   gabapentin  600 mg Oral TID   hydrOXYzine  25 mg Oral QID   insulin aspart  0-15 Units Subcutaneous TID WC   linagliptin  5 mg Oral q AM   losartan  100 mg Oral q AM   methocarbamol  500 mg Oral QID   metoprolol tartrate  25 mg Oral BID   pantoprazole  40 mg Oral Daily   sodium chloride flush  3 mL Intravenous Q12H   warfarin  2.5 mg Oral Q M,W,F,Su-1800   warfarin  5 mg Oral Q T,Th,Sat-1800   Warfarin - Physician Dosing Inpatient   Does not apply q1600   Continuous Infusions:  sodium chloride     PRN Meds: sodium chloride, acetaminophen, albuterol, lactulose, ondansetron **OR** ondansetron (ZOFRAN) IV, oxyCODONE, sodium chloride flush, traZODone   Vital Signs    Vitals:   04/27/22 2349 04/28/22 0418 04/28/22 0700 04/28/22 0743  BP: 123/70 (!) 125/59  126/83  Pulse: 87 76  81  Resp: '13 18  16  '$ Temp: 98.1 F (36.7 C) 98 F (36.7 C)  98.2 F (36.8 C)  TempSrc: Oral Oral  Oral  SpO2: 90% 94%  92%  Weight:   91.3 kg   Height:        Intake/Output Summary (Last 24 hours) at 04/28/2022 0830 Last data filed at 04/28/2022 0800 Gross per 24 hour  Intake 960 ml  Output 1500 ml  Net -540 ml      04/28/2022    7:00 AM 04/27/2022    6:08 AM 04/26/2022    4:22 AM  Last 3 Weights  Weight (lbs) 201 lb 4.5 oz 204 lb 12.9 oz 207 lb 10.8 oz  Weight (kg) 91.3 kg 92.9 kg 94.2 kg      Telemetry    Continues with sinus rhythm and bouts of atrial fibrillation and flutter - Personally  Reviewed  ECG    No new tracings - Personally Reviewed  Physical Exam   GEN: No acute distress.   Neck: No JVD Cardiac: RRR Respiratory: Clear to auscultation bilaterally. GI: Soft, nontender, non-distended  MS: No edema; No deformity. Neuro:  Nonfocal  Psych: Normal affect   Labs    High Sensitivity Troponin:  No results for input(s): TROPONINIHS in the last 720 hours.   Chemistry Recent Labs  Lab 04/24/22 0119 04/25/22 0336 04/27/22 0857  NA 133* 134* 135  K 5.0 4.5 4.5  CL 99 101 102  CO2 '24 25 25  '$ GLUCOSE 108* 127* 130*  BUN '15 18 14  '$ CREATININE 1.06* 1.09* 1.16*  CALCIUM 8.3* 8.2* 8.6*  MG  --   --  1.9  GFRNONAA 59* 57* 53*  ANIONGAP '10 8 8    '$ Lipids No results for input(s): CHOL, TRIG, HDL, LABVLDL, LDLCALC, CHOLHDL in the last 168 hours.  Hematology Recent Labs  Lab 04/22/22 0557 04/23/22 0131 04/24/22 0119  WBC 7.4 7.0 6.4  RBC  2.35* 2.53* 2.61*  HGB 6.7* 7.5* 7.7*  HCT 20.3* 20.9* 22.4*  MCV 86.4 82.6 85.8  MCH 28.5 29.6 29.5  MCHC 33.0 35.9 34.4  RDW 15.6* 14.8 14.9  PLT 78* 101* 109*   Thyroid No results for input(s): TSH, FREET4 in the last 168 hours.  BNPNo results for input(s): BNP, PROBNP in the last 168 hours.  DDimer No results for input(s): DDIMER in the last 168 hours.   Radiology    No results found.  Cardiac Studies   TEE intraop 04/19/22: PRE-OP FINDINGS   Left Ventricle: The left ventricle has normal systolic function, with an  ejection fraction of 60-65%. The cavity size was normal. There is mild  concentric left ventricular hypertrophy.    Right Ventricle: The right ventricle has normal systolic function. The  cavity was normal. There is no increase in right ventricular wall  thickness.   Left Atrium: Left atrial size was dilated. The left atrial appendage is  well visualized and there is no evidence of thrombus present.   Right Atrium: Right atrial size was normal in size.   Interatrial Septum: No atrial level  shunt detected by color flow Doppler.   Pericardium: There is no evidence of pericardial effusion.   Mitral Valve: The mitral valve is rheumatic. Mitral valve regurgitation is  severe by color flow Doppler. The MR jet is posteriorly-directed. There is  No evidence of mitral stenosis.   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 Tricuspid valve  regurgitation moderate-severe. No evidence of tricuspid stenosis is  present. The tricuspid valve is status post repair with an annuloplasty  ring.   Aortic Valve: The aortic valve is tricuspid Aortic valve regurgitation was  not visualized by color flow Doppler. There is no stenosis of the aortic  valve.    Pulmonic Valve: The pulmonic valve was not assessed.  Pulmonic valve regurgitation was not assessed by color flow Doppler.    Aorta: The aortic root, ascending aorta and aortic arch are normal in size  and structure.    Right and left heart cath 02/12/22: CONCLUSIONS: Mild pulmonary hypertension with mean PA pressure 31 mmHg.  Mean pulmonary wedge pressure 23 mmHg with a V wave 41 mmHg.  WHO group 2 pulmonary hypertension. Pulmonary vascular resistance 0.99 Woods units Left ventricular end diastolic pressure 35 mmHg, consistent with diastolic heart failure. Right dominant coronary anatomy.  Widely patent coronaries without obstructive disease. Previously documented severe mitral regurgitation.   RECOMMENDATIONS:   Per Dr. Glenford Bayley who is making plans for referral to CV surgeon. Home later today if no complications. Resume all home medications including p.m. doses of antihypertensive agents.  She did not take any of her medicine this morning and had elevated blood pressure requiring IV hydralazine and labetalol.  Patient Profile     61 y.o. female  with a hx of hypertension, diabetes, fibromyalgia, history of hep C s/p therapy, distant substance abuse, tobacco abuse, PAF (?2018 with prior xarelto), severe MR and TR  confirmed on TEE with abnormal inflammatory/rheumatic valves now s/p MVR and TV annuloplasty who is being seen 04/27/2022 for the evaluation of postoperative atrial fibrillation.  Assessment & Plan    Atrial fibrillation / flutter Hx of PAF (2018) - hx of PAF on cardizem, no longer on xarelto - metoprolol 25 mg BID - overnight, rates in the 130s - continue BB and start 180 mg cardizem - will discharge with rate controls strategy, suspect we will need to determine Afib burden for recommendations  for medication adjustments vs AAT   Chronic coumadin therapy Will be anticoagulated for 3 months s/p MV repair Will need to discuss ongoing [place] long-term - INR 2.5 today   Hypertension PTA: 12.5 mg spironolactone, metoprolol, losartan 100 mg daily, and 120 mg cardizem - now on 100 mg losartan and metoprolol 25 mg BID  - SBP in the 120s - start Cardizem 120 mg daily - has received 100 mg losartan this morning, will continue this dose as her BP seems to be tolerating well - will continue to hold spiro - may restart in OP setting   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 I personally reviewed laboratory data, imaging studies and relevant notes. I independently examined the patient and formulated the important aspects of the plan. I have personally discussed the plan with the patient and/or family. Comments or changes to the note/plan are indicated below.  Sinus rhythm with intermittent episodes of atrial fibrillation/flutter-rate control should be a focus for now.  Cardizem 120 mg daily will be started. Agree with holding her Aldactone. She will need outpatient follow-up with cardiology.   Berniece Salines DO, MS [facility] Attending Cardiologist [place]  [address] [place],  [address] [phone] Website: [email or web address]   For questions or  updates, please contact [place] Please consult [email or web address] for contact info under        Signed, Ledora Bottcher, PA  04/28/2022, 8:30 AM

## 2022-04-30 ENCOUNTER — Telehealth: Payer: Self-pay | Admitting: *Deleted

## 2022-04-30 NOTE — Telephone Encounter (Addendum)
Pt recently in the hospital 5/15 to 5/24. Pt d/c to [facility]. On D/C summary it says to check INR on 5/26 and call results to coumadin clinic.   [facility] called and asked if they could monitor and manage her INR while she is there.   Informed them that we prefer for them to manage while she is there. However, once she is d/c we would like monitor  and manage her warfarin. Informed them to let us know when she is d/c. [facility] said that pt will probably have [place] once d/c.   Pt is a patient of Dr. Gasper Sells

## 2022-05-02 ENCOUNTER — Emergency Department (HOSPITAL_COMMUNITY): Payer: Medicare Other

## 2022-05-02 ENCOUNTER — Emergency Department (HOSPITAL_COMMUNITY)
Admission: EM | Admit: 2022-05-02 | Discharge: 2022-05-02 | Disposition: A | Discharge status: Home / Self Care | Payer: Medicare Other | Attending: Emergency Medicine | Admitting: Emergency Medicine

## 2022-05-02 ENCOUNTER — Other Ambulatory Visit: Payer: Self-pay

## 2022-05-02 ENCOUNTER — Encounter (HOSPITAL_COMMUNITY): Payer: Self-pay | Admitting: Emergency Medicine

## 2022-05-02 DIAGNOSIS — Z7982 Long term (current) use of aspirin: Secondary | ICD-10-CM | POA: Diagnosis not present

## 2022-05-02 DIAGNOSIS — I10 Essential (primary) hypertension: Secondary | ICD-10-CM | POA: Insufficient documentation

## 2022-05-02 DIAGNOSIS — Z7901 Long term (current) use of anticoagulants: Secondary | ICD-10-CM | POA: Diagnosis not present

## 2022-05-02 DIAGNOSIS — R0789 Other chest pain: Secondary | ICD-10-CM | POA: Diagnosis not present

## 2022-05-02 DIAGNOSIS — R079 Chest pain, unspecified: Secondary | ICD-10-CM | POA: Diagnosis present

## 2022-05-02 DIAGNOSIS — Z79899 Other long term (current) drug therapy: Secondary | ICD-10-CM | POA: Diagnosis not present

## 2022-05-02 DIAGNOSIS — E119 Type 2 diabetes mellitus without complications: Secondary | ICD-10-CM | POA: Insufficient documentation

## 2022-05-02 LAB — TROPONIN I (HIGH SENSITIVITY)
Troponin I (High Sensitivity): 68 ng/L — ABNORMAL HIGH (ref <18)
Troponin I (High Sensitivity): 64 ng/L — ABNORMAL HIGH (ref <18)

## 2022-05-02 LAB — CBC WITH DIFFERENTIAL/PLATELET
Abs Immature Granulocytes: 0.03 10*3/uL (ref 0.00–0.07)
Basophils Absolute: 0.1 10*3/uL (ref 0.0–0.1)
Basophils Relative: 1 %
Eosinophils Absolute: 0.2 10*3/uL (ref 0.0–0.5)
Eosinophils Relative: 3 %
HCT: 29.9 % — ABNORMAL LOW (ref 36.0–46.0)
Hemoglobin: 9.5 g/dL — ABNORMAL LOW (ref 12.0–15.0)
Immature Granulocytes: 0 %
Lymphocytes Relative: 25 %
Lymphs Abs: 1.9 10*3/uL (ref 0.7–4.0)
MCH: 29 pg (ref 26.0–34.0)
MCHC: 31.8 g/dL (ref 30.0–36.0)
MCV: 91.2 fL (ref 80.0–100.0)
Monocytes Absolute: 0.5 10*3/uL (ref 0.1–1.0)
Monocytes Relative: 7 %
Neutro Abs: 4.8 10*3/uL (ref 1.7–7.7)
Neutrophils Relative %: 64 %
Platelets: 300 10*3/uL (ref 150–400)
RBC: 3.28 MIL/uL — ABNORMAL LOW (ref 3.87–5.11)
RDW: 15.2 % (ref 11.5–15.5)
WBC: 7.5 10*3/uL (ref 4.0–10.5)
nRBC: 0 % (ref 0.0–0.2)

## 2022-05-02 LAB — BASIC METABOLIC PANEL
Anion gap: 7 (ref 5–15)
BUN: 17 mg/dL (ref 8–23)
CO2: 22 mmol/L (ref 22–32)
Calcium: 8.7 mg/dL — ABNORMAL LOW (ref 8.9–10.3)
Chloride: 106 mmol/L (ref 98–111)
Creatinine, Ser: 1.02 mg/dL — ABNORMAL HIGH (ref 0.44–1.00)
GFR, Estimated: >60 mL/min (ref >60)
Glucose, Bld: 117 mg/dL — ABNORMAL HIGH (ref 70–99)
Potassium: 4.5 mmol/L (ref 3.5–5.1)
Sodium: 135 mmol/L (ref 135–145)

## 2022-05-02 LAB — PROTIME-INR
INR: 1.8 — ABNORMAL HIGH (ref 0.8–1.2)
Prothrombin Time: 21.1 seconds — ABNORMAL HIGH (ref 11.4–15.2)

## 2022-05-02 MED ORDER — OXYCODONE HCL 5 MG PO TABS
15.0000 mg | ORAL_TABLET | Freq: Once | ORAL | Status: AC
  Administered 2022-05-02: 15 mg via ORAL
  Filled 2022-05-02: qty 3

## 2022-05-02 MED ORDER — ACETAMINOPHEN 325 MG PO TABS
650.0000 mg | ORAL_TABLET | Freq: Once | ORAL | Status: AC
  Administered 2022-05-02: 650 mg via ORAL
  Filled 2022-05-02: qty 2

## 2022-05-02 MED ORDER — MORPHINE SULFATE (PF) 4 MG/ML IV SOLN
4.0000 mg | Freq: Once | INTRAVENOUS | Status: AC
  Administered 2022-05-02: 4 mg via INTRAVENOUS
  Filled 2022-05-02: qty 1

## 2022-05-02 NOTE — Discharge Instructions (Addendum)
Your testing today did not show any signs of heart attack.  Your x-ray was normal, I suspect the pain is coming from your chest wall after surgery which is somewhat normal.  You may continue to take the oxycodone that you have been prescribed, make sure you are taking a stool softener if you are taking this medication.  ER for worsening symptoms  Thank you for allowing [place] to treat you in the emergency department today.  After reviewing your examination and potential testing that was done it appears that you are safe to go home.  I would like for you to follow-up with your doctor within the next several days, have them obtain your results and follow-up with them to review all of these tests.  If you should develop severe or worsening symptoms return to the emergency department immediately

## 2022-05-02 NOTE — ED Triage Notes (Signed)
To ED via [place] from [place] in [place]-- with c/o upper chest pain with movement that started a few days ago but started as a shooting pain today--  Recent OHS with valve replacement May 15th--  Incision midsternal- clean, healing well. No drainage noted.

## 2022-05-02 NOTE — ED Notes (Signed)
Discharge instructions reviewed with patient, pt denies any questions or any further needs at this time. [facility] here to transport pt back to facility.

## 2022-05-02 NOTE — ED Provider Notes (Signed)
[facility] EMERGENCY DEPARTMENT Provider Note   CSN: [id number] Arrival date & time: 05/02/22  1403     History  Chief Complaint  Patient presents with   Chest Pain    [name] is a 61 y.o. female.   Chest Pain  This patient is a 61 year old female, she has a history of hypertension and atrial flutter on diltiazem, she is on Tradjenta for her diabetes and she takes warfarin as well.  This patient had a recent cardiothoracic surgery by Dr. Roxan Hockey, the patient was discharged from the hospital on May 24 after undergoing surgery on Apr 19, 2022.  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  She is on Coumadin.  During the work-up for this she had a heart catheterization on February 12, 2022.  The patient was found to have unremarkable coronary arteries that were noted to be "widely patent" based on a cardiology note from Apr 05, 2022 by Dr. Gasper Sells.  The patient has been at her nursing facility for approximately 4 days and in so doing his been doing some rehab, she has had ongoing pain in her chest ever since surgery but reports that today the pain was a little bit worse than usual more on the right side and more with range of motion of the right arm.  She does have pain with deep breathing but this has been since surgery, she has pain when she has the occasional cough.  She has no fevers or chills no swelling of the legs.  She is not short of breath at this time  She was sent by the nurse practitioner at her facility for further evaluation due to the ongoing chest pain to make sure there is nothing else going on.  Home Medications Prior to Admission medications   Medication Sig Start Date End Date Taking? Authorizing Provider  albuterol (PROVENTIL HFA;VENTOLIN HFA) 108 (90 Base) MCG/ACT inhaler Inhale 2 puffs into the lungs every 6 (six) hours as needed for wheezing or shortness of breath.    Yes [provider]   ACCU-CHEK FASTCLIX LANCETS MISC 1 each by Does not apply route daily. Check blood sugar once daily. 250.00 10/26/11   [name], DO  aspirin 81 MG EC tablet Take 81 mg by mouth daily.    [provider]  atorvastatin (LIPITOR) 20 MG tablet Take 20 mg by mouth daily.    [provider]  baclofen (LIORESAL) 10 MG tablet Take 10 mg by mouth 2 (two) times daily.    [provider]  buPROPion (WELLBUTRIN XL) 300 MG 24 hr tablet Take 300 mg by mouth in the morning. 03/24/22   [provider]  Cyanocobalamin (VITAMIN B 12 PO) Take 50 mcg by mouth.    [provider]  dexlansoprazole (DEXILANT) 60 MG capsule Take 60 mg by mouth daily. 12/18/21   [provider]  diltiazem (CARDIZEM CD) 120 MG 24 hr capsule Take 1 capsule (120 mg total) by mouth daily. 04/29/22   Antony Odea, PA-C  ferrous [id number] C-folic acid (TRINSICON / FOLTRIN) capsule Take 1 capsule by mouth 2 (two) times daily after a meal. 04/28/22   Roddenberry, Myron G, PA-C  FLUoxetine (PROZAC) 20 MG capsule Take 20 mg by mouth every morning. 04/14/22   [provider]  gabapentin (NEURONTIN) 600 MG tablet Take 600 mg by mouth 3 (three) times daily. 10/12/21   [provider]  glucose blood (ACCU-CHEK SMARTVIEW) test strip Check blood  sugar once daily. 250.00 10/26/11 12/30/21  [name], DO  hydrOXYzine (ATARAX/VISTARIL) 25 MG tablet Take 25 mg by mouth in the morning, at noon, in the evening, and at bedtime. 12/21/18   [provider]  linagliptin (TRADJENTA) 5 MG TABS tablet Take 5 mg by mouth in the morning.    [provider]  losartan (COZAAR) 50 MG tablet Take 1 tablet (50 mg total) by mouth in the morning. 04/29/22   Roddenberry, Arlis Porta, PA-C  methocarbamol (ROBAXIN) 500 MG tablet Take 500 mg by mouth 4 (four) times daily.    [provider]  metoprolol tartrate (LOPRESSOR) 25 MG tablet Take 1 tablet (25 mg  total) by mouth 2 (two) times daily. 04/28/22   Antony Odea, PA-C  naloxone [facility]) nasal spray 4 mg/0.1 mL Place 1 spray into the nose once.    [provider]  nystatin ointment (MYCOSTATIN) Apply topically 2 (two) times daily. 04/05/22   [provider]  oxyCODONE (ROXICODONE) 15 MG immediate release tablet Take 1 tablet (15 mg total) by mouth every 6 (six) hours as needed for up to 7 days for severe pain. 04/28/22 05/05/22  Antony Odea, PA-C  PREMARIN 0.45 MG tablet Take 0.45 mg by mouth daily. 01/11/22   [provider]  traZODone (DESYREL) 100 MG tablet Take 2.5 tablets (250 mg total) by mouth at bedtime as needed for sleep. Patient taking differently: Take 200 mg by mouth at bedtime. 11/23/18   [name], MD  warfarin (COUMADIN) 2.5 MG tablet Take 1 tablet (2.5 mg total) by mouth every Monday,Wednesday,Friday, and Sunday at 6 PM. 04/28/22   Antony Odea, PA-C  warfarin (COUMADIN) 5 MG tablet Take 1 tablet (5 mg total) by mouth every Tuesday, Thursday, and Saturday at 6 PM. 04/29/22   Roddenberry, Arlis Porta, PA-C      Allergies    Hydrocodone-acetaminophen and Hydrocodone    Review of Systems   Review of Systems  Cardiovascular:  Positive for chest pain.  All other systems reviewed and are negative.  Physical Exam Updated Vital Signs BP 118/79   Pulse 79   Temp 99.4 F (37.4 C) (Oral)   Resp 16   Ht 1.803 m ('5\' 11"'$ )   Wt 92.5 kg   LMP 12/06/1976   SpO2 97%   BMI 28.45 kg/m  Physical Exam Vitals and nursing note reviewed.  Constitutional:      General: She is not in acute distress.    Appearance: She is well-developed.  HENT:     Head: Normocephalic and atraumatic.     Mouth/Throat:     Pharynx: No oropharyngeal exudate.  Eyes:     General: No scleral icterus.       Right eye: No discharge.        Left eye: No discharge.     Conjunctiva/sclera: Conjunctivae normal.     Pupils: Pupils are equal, round, and reactive to  light.  Neck:     Thyroid: No thyromegaly.     Vascular: No JVD.  Cardiovascular:     Rate and Rhythm: Normal rate and regular rhythm.     Heart sounds: Normal heart sounds. No murmur heard.   No friction rub. No gallop.  Pulmonary:     Effort: Pulmonary effort is normal. No respiratory distress.     Breath sounds: Normal breath sounds. No wheezing or rales.     Comments: Median sternotomy present, well-healed surgical scar, no gaps, no bleeding, no drainage,  no surrounding redness, there is tenderness in the chest, upper bilateral.  No crepitance or subcutaneous emphysema Chest:     Chest wall: Tenderness present.  Abdominal:     General: Bowel sounds are normal. There is no distension.     Palpations: Abdomen is soft. There is no mass.     Tenderness: There is no abdominal tenderness.  Musculoskeletal:        General: No tenderness. Normal range of motion.     Cervical back: Normal range of motion and neck supple.     Right lower leg: No edema.     Left lower leg: No edema.  Lymphadenopathy:     Cervical: No cervical adenopathy.  Skin:    General: Skin is warm and dry.     Findings: No erythema or rash.  Neurological:     Mental Status: She is alert.     Coordination: Coordination normal.  Psychiatric:        Behavior: Behavior normal.    ED Results / Procedures / Treatments   Labs (all labs ordered are listed, but only abnormal results are displayed) Labs Reviewed  CBC WITH DIFFERENTIAL/PLATELET - Abnormal; Notable for the following components:      Result Value   RBC 3.28 (*)    Hemoglobin 9.5 (*)    HCT 29.9 (*)    All other components within normal limits  PROTIME-INR - Abnormal; Notable for the following components:   Prothrombin Time 21.1 (*)    INR 1.8 (*)    All other components within normal limits  BASIC METABOLIC PANEL  TROPONIN I (HIGH SENSITIVITY)    EKG EKG Interpretation  Date/Time:  Sunday May 02 2022 14:10:22 EDT Ventricular Rate:  84 PR  Interval:    QRS Duration: 125 QT Interval:  409 QTC Calculation: 484 R Axis:   -20 Text Interpretation: Atrial flutter with varied AV block, Ventricular premature complex Nonspecific intraventricular conduction delay Abnormal inferior Q waves Nonspecific T abnormalities, lateral leads Confirmed by Noemi Chapel [phone]) on 05/02/2022 2:31:48 PM  Radiology DG Chest Port 1 View  Result Date: 05/02/2022 CLINICAL DATA:  Upper chest pain, shortness of breath EXAM: PORTABLE CHEST 1 VIEW COMPARISON:  04/22/2022 FINDINGS: Single frontal view of the chest demonstrates stable enlargement the cardiac silhouette. Postsurgical changes are seen from median sternotomy and mitral and tricuspid valve replacements. No acute airspace disease, effusion, or pneumothorax. No acute bony abnormalities. IMPRESSION: 1. No acute intrathoracic process. Electronically Signed   By: Randa Ngo M.D.   On: 05/02/2022 14:59    Procedures Procedures    Medications Ordered in ED Medications  morphine (PF) 4 MG/ML injection 4 mg (4 mg Intravenous Given 05/02/22 1516)    ED Course/ Medical Decision Making/ A&P                           Medical Decision Making Amount and/or Complexity of Data Reviewed Labs: ordered. Radiology: ordered.  Risk Prescription drug management.   There is a slight irregularity to the patient's heart, she is in atrial flutter with variable block.  She has a history of this, she is anticoagulated, we will check a Coumadin level.  The causes of the patient's chest pain could be multifactorial but could include chest wall pain from the surgery and the sternotomy, this could be related to an underlying effusion or empyema, it would seem much less likely to be related to any obstructive coronary arteries given the  unremarkable EKG with regards to ST segments and the history of clean coronaries from several months ago.  Vital signs are unremarkable, she is not tachycardic, her respiratory rate is  even and unlabored and on my exam was about 16 breaths/min speaking in full sentences without distress or abnormal lung sounds.  She has a heart rate in the 80s her blood pressure is 124/70.  Labs and chest x-ray will be ordered, patient agreeable to the plan  Labs: At the time of change of shift the CBC is back and shows no leukocytosis, hemoglobin is up to 9.5 which is better than discharge and platelet count is 300,000.  INR is 1.8, troponin has not yet resulted.  Imaging: I personally viewed and interpreted the x-ray which shows no signs of significant effusion or pneumothorax, otherwise unremarkable, I agree with the radiologist interpretation.  Change of shift: I have discussed the case with Dr. Verner Chol who has accepted care at change of shift pending troponin, anticipate discharge if negative        Final Clinical Impression(s) / ED Diagnoses Final diagnoses:  Chest wall pain     Noemi Chapel, MD 05/02/22 1534

## 2022-05-02 NOTE — ED Notes (Signed)
Report attempted to be called to facility - no answer - [facility] here to take pt

## 2022-05-02 NOTE — ED Provider Notes (Addendum)
3:49 PM Care transferred to me. Discussed case with cardiology, Dr. Harrell Gave. Patient is stable on my exam. Perhaps this is residual troponin elevation from her surgery? Had clean cath just a few months ago. No ischemia on EKG. Plan with cardiology is to get 2nd troponin and see trend.  Second troponin is flat at 64.  Discussed with Dr.Kahn, who agrees this is unlikely to be MI with flat troponins apply is from some low-level inflammation from the recent surgery.  Patient has reproducible chest pain and worse with movements though no pleuritic pain, hypoxia, or tachycardia.  I think PE is less likely and do not think this needs work-up currently.  She is currently on warfarin. She feels ready for discharge.  Will discharge home with return precautions.    Sherwood Gambler, MD 05/02/22 [phone]

## 2022-05-09 NOTE — Progress Notes (Deleted)
Cardiology Office Note:    Date:  05/09/2022   ID:  [name], DOB May 31, 1961, MRN [id number]  PCP:  [facility] [place] Providers Cardiologist:  Nicole Lean, MD { Click to update primary MD,subspecialty MD or APP then REFRESH:1}    Referring MD: [facility] [place]   Chief Complaint: ***  History of Present Illness:    [name] is a *** 61 y.o. female with a hx of hypertension, diabetes, fibromyalgia, history of hepatitis C s/p therapy, distant substance abuse, tobacco abuse, PAF (? 2018 with prior Xarelto), severe MR and TR confirmed on TEE with abnormal inflammatory/rheumatic valves female s/p MVR and TV annuloplasty   She established care with Dr. Glenford [name] on 12/30/2021 for evaluation of shortness of breath  with minimal activity, improved when lying or sitting. + orthopnea on 3-4 pillows nightly.  She was started on Lasix 12/08/2021 after ED visit and had noted some improvement.  Echocardiogram 01/11/2022 revealed severe mitral valve regurgitation and moderate to severe tricuspid regurgitation, normal LVEF 55 to 60%, moderate LVH, moderately elevated PASP, severely dilated LA, mild dilatation of ascending aorta measuring 38 mm.  No history of endocarditis.  TEE revealed significant eccentric MR and TR, cardiac cath arranged. Preoperative heart catheterization showed widely patent coronaries without obstructive disease and mild pulmonary hypertension WHO group 2. She was referred to CT surgery and ultimately underwent MVR and TVR on 04/19/2022. Immediately following surgery she was in a junctional rhythm and required temporary pacing, she converted to atrial fibrillation.  She is on Coumadin therapy s/p MVR. PAF treated with metoprolol 25 mg twice daily, Cardizem 180 mg daily.  Spironolactone was held due to initiation of Cardizem.  She was discharged to SNF on 04/28/2022.  Today,   Postop echo scheduled Labs  Past Medical History:   Diagnosis Date   Arthritis    Cervicalgia    Chondromalacia of patella    Chronic neck pain    Chronic pain syndrome    Depression    secondary to loss of her son at age 49   Diabetes mellitus    Diabetic neuropathy ([place])    DMII (diabetes mellitus, type 2) ([place])    Enthesopathy of hip region    Fibromyalgia    Hep C w/o coma, chronic ([place])    Hepatitis C    diagnosed 2005   Hypertension    Insomnia    Insomnia    Low back pain    Lumbago    Mitral regurgitation    Obesity    Pain in joint, upper arm    Primary localized osteoarthrosis, lower leg    Substance abuse ([place])    sober since 2001   Tobacco abuse    Tricuspid regurgitation    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    Past Surgical History:  Procedure Laterality Date   ABDOMINAL HYSTERECTOMY     at age 23, unknown reasons   CARPAL TUNNEL RELEASE  12/07/2011   left side   EYE SURGERY     FOOT SURGERY Bilateral    MITRAL VALVE REPAIR N/A 04/19/2022   Procedure: MITRAL VALVE REPAIR WITH 30MM PHYSIO II ANNULOPLASTY RING;  Surgeon: Nicole Nakayama, MD;  Location: [facility];  Service: Open Heart Surgery;  Laterality: N/A;   RIGHT/LEFT HEART CATH AND CORONARY ANGIOGRAPHY N/A 02/12/2022   Procedure: RIGHT/LEFT HEART CATH AND CORONARY ANGIOGRAPHY;  Surgeon: Belva Crome, MD;  Location: [facility];  Service: Cardiovascular;  Laterality: N/A;   TEE WITHOUT CARDIOVERSION N/A 02/03/2022   Procedure: TRANSESOPHAGEAL ECHOCARDIOGRAM (TEE);  Surgeon: Josue Hector, MD;  Location[facility];  Service: Cardiovascular;  Laterality: N/A;   TEE WITHOUT CARDIOVERSION N/A 04/19/2022   Procedure: TRANSESOPHAGEAL ECHOCARDIOGRAM (TEE);  Surgeon: Nicole Nakayama, MD;  Location: [facility];  Service: Open Heart Surgery;  Laterality: N/A;   TRICUSPID VALVE REPLACEMENT N/A 04/19/2022   Procedure: TRICUSPID VALVE REPAIR WITH 32MM MC3 ANNULOPLASTY RING;  Surgeon: Nicole Nakayama, MD;  Location: [facility];  Service:  Open Heart Surgery;  Laterality: N/A;    Current Medications: No outpatient medications have been marked as taking for the 05/11/22 encounter (Appointment) with Ann Maki, Lanice Schwab, NP.   Current Facility-Administered Medications for the 05/11/22 encounter (Appointment) with Ann Maki, Lanice Schwab, NP  Medication   triamcinolone acetonide (KENALOG) 10 MG/ML injection 10 mg   triamcinolone acetonide (KENALOG-40) injection 40 mg     Allergies:   Hydrocodone-acetaminophen and Hydrocodone   Social History   Socioeconomic History   Marital status: Single    Spouse name: Not on file   Number of children: 2   Years of education: Not on file   Highest education level: Not on file  Occupational History   Occupation: CNA    Comment: worked for 15 years  Tobacco Use   Smoking status: Every Day    Packs/day: 0.50    Years: 35.00    Pack years: 17.50    Types: Cigarettes   Smokeless tobacco: Never   Tobacco comments:    4 cigarettes a day  Vaping Use   Vaping Use: Some days  Substance and Sexual Activity   Alcohol use: No   Drug use: No    Types: "Crack" cocaine    Comment: last used Oct 2016   Sexual activity: Not Currently    Comment: Celebate since 2000  Other Topics Concern   Not on file  Social History Narrative   Born and raised by mom until mom died when she was 14yo. After that she stayed with her aunt. Dad was married to someone else and was never around.  Pt has 1 brother and 1 sister and pt is the middle child. Pt had a very rough childhood. Never married. 2 kids one son died several  yrs ago and other son has Schizophrenia. Pt has graduated HS and has some college. Pt is on disability and is unemployed. 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       Financial assistance approved for 100% discount at [facility] and has [facility] card   [employer]  September 29, 2010 2:27 PM   Social Determinants of Health   Financial Resource Strain: Not on file  Food Insecurity: Not on file   Transportation Needs: Not on file  Physical Activity: Not on file  Stress: Not on file  Social Connections: Not on file     Family History: The patient's ***family history includes Alcohol abuse in her brother, father, and sister; Arthritis in an other family member; Diabetes in an other family member; Drug abuse in her brother and sister; Heart attack (age of onset: 8) in her son; Hypertension in an other family member; Schizophrenia in her son; Uterine cancer in her mother.  ROS:   Please see the history of present illness.    *** All other systems reviewed and are negative.  Labs/Other Studies Reviewed:    The following studies were reviewed today:  TCTS Op Note 04/19/22  PROCEDURE:   Median sternotomy, extracorporeal circulation.  Mitral valve repair with closure of cleft at A2-A3 and placement of 30 mm Edwards Physio II annuloplasty ring and  Tricuspid valve repair with 32 mm MC3 annuloplasty ring.   Right and left heart cath 02/12/22: CONCLUSIONS: Mild pulmonary hypertension with mean PA pressure 31 mmHg.  Mean pulmonary wedge pressure 23 mmHg with a V wave 41 mmHg.  WHO group 2 pulmonary hypertension. Pulmonary vascular resistance 0.99 Woods units Left ventricular end diastolic pressure 35 mmHg, consistent with diastolic heart failure. Right dominant coronary anatomy.  Widely patent coronaries without obstructive disease. Previously documented severe mitral regurgitation.   RECOMMENDATIONS:   Per Dr. Glenford [name] who is making plans for referral to CV surgeon. Home later today if no complications. Resume all home medications including p.m. doses of antihypertensive agents.  She did not take any of her medicine this morning and had elevated blood pressure requiring IV hydralazine and labetalol.  Echo TEE 02/03/22  1. Severe MR ? rheumatic valve Patient may need TV addressed at time of  surgery as well.   2. Left ventricular ejection fraction, by estimation, is 60 to  65%. The  left ventricle has normal function. The left ventricle has no regional  wall motion abnormalities.   3. Right ventricular systolic function is normal. The right ventricular  size is normal.   4. Left atrial size was mildly dilated. No left atrial/left atrial  appendage thrombus was detected.   5. Thickened leaflets ? 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 Blunted systolic flow in PVls PISA  1 cm2 . The mitral valve is rheumatic. Severe mitral valve regurgitation.  No evidence of mitral stenosis.   6. Thickened leaflets ? rheumatic . The tricuspid valve is abnormal.  Tricuspid valve regurgitation is severe.   7. The aortic valve is normal in structure. Aortic valve regurgitation is  not visualized. No aortic stenosis is present.   8. The inferior vena cava is normal in size with greater than 50%  respiratory variability, suggesting right atrial pressure of 3 mmHg.   Conclusion(s)/Recommendation(s): Normal biventricular function   2D Echo TTE 01/11/22    1. Left ventricular ejection fraction, by estimation, is 55 to 60%. The  left ventricle has normal function. The left ventricle has no regional  wall motion abnormalities. There is moderate left ventricular hypertrophy.  Left ventricular diastolic  parameters are indeterminate.   2. Right ventricular systolic function is normal. The right ventricular  size is normal. There is moderately elevated pulmonary artery systolic  pressure. The estimated right ventricular systolic pressure is 76.7 mmHg.   3. Left atrial size was severely dilated.   4. The tricuspid valve is abnormal. Tricuspid valve regurgitation is  moderate to severe.   5. The aortic valve is tricuspid. Aortic valve regurgitation is trivial.  No aortic stenosis is present.   6. Aortic dilatation noted. There is mild dilatation of the ascending  aorta, measuring 38 mm.   7. The inferior vena cava is normal in size  with greater than 50%  respiratory variability, suggesting right atrial pressure of 3 mmHg.   8. The mitral valve is abnormal. There is moderate thickening of the  mitral valve leaflets.      Severe mitral valve regurgitation. Recommend TEE for further  evaluation   Cardiac monitor 01/23/22  Patient had a minimum heart rate of 45 bpm, maximum heart rate of 171 bpm (SVT), and  average heart rate of 71 bpm. Predominant underlying rhythm was sinus rhythm. Three runs of NSVT occurred lasting 6 beats at longest with a max rate of 169 bpm at fastest. Many runs of SVT occurred lasting 21 seconds at longest with a max rate of 171 bpm at fastest. Isolated PACs were rare (<1.0%). Isolated PVCs were occasional ( 2.7%). Triggered and diary events associated with sinus rhythm and PVCs.  Recent Labs: 12/08/2021: B Natriuretic Peptide 705.8 04/15/2022: ALT 14 04/27/2022: Magnesium 1.9 05/02/2022: BUN 17; Creatinine, Ser 1.02; Hemoglobin 9.5; Platelets 300; Potassium 4.5; Sodium 135  Recent Lipid Panel    Component Value Date/Time   CHOL 180 03/25/2016 0909   TRIG 122 03/25/2016 0909   HDL 39 (L) 03/25/2016 0909   CHOLHDL 4.6 03/25/2016 0909   VLDL 24 03/25/2016 0909   LDLCALC 117 03/25/2016 0909     Risk Assessment/Calculations:   {Does this patient have ATRIAL FIBRILLATION?:[phone]}       Physical Exam:    VS:  LMP 12/06/1976     Wt Readings from Last 3 Encounters:  05/02/22 204 lb (92.5 kg)  04/28/22 201 lb 4.5 oz (91.3 kg)  04/15/22 212 lb (96.2 kg)     GEN: *** Well nourished, well developed in no acute distress HEENT: Normal NECK: No JVD; No carotid bruits CARDIAC: ***RRR, no murmurs, rubs, gallops RESPIRATORY:  Clear to auscultation without rales, wheezing or rhonchi  ABDOMEN: Soft, non-tender, non-distended MUSCULOSKELETAL:  No edema; No deformity. *** pedal pulses, ***bilaterally SKIN: Warm and dry NEUROLOGIC:  Alert and oriented x 3 PSYCHIATRIC:  Normal affect   EKG:   EKG is *** ordered today.  The ekg ordered today demonstrates ***  Diagnoses:    1. Severe mitral regurgitation   2. Severe tricuspid regurgitation   3. PAF (paroxysmal atrial fibrillation) ([place])   4. S/P MVR (mitral valve repair)   5. S/P tricuspid valve repair   6. Hypertension, unspecified type    Assessment and Plan:     Severe mitral regurgitation s/p MVR Severe tricuspid regurgitation:  PAF  {The patient has an active order for outpatient cardiac rehabilitation.   Please indicate if the patient is ready to start. Do NOT delete this.  It will auto delete.  Refresh note, then sign.              Click here to document readiness and see contraindications.  :1}  Cardiac Rehabilitation Eligibility Assessment       {The patient has an active order for outpatient cardiac rehabilitation.   Please indicate if the patient is ready to start. Do NOT delete this.  It will auto delete.  Refresh note, then sign.              Click here to document readiness and see contraindications.  :1}  Cardiac Rehabilitation Eligibility Assessment       {Are you ordering a CV Procedure (e.g. stress test, cath, DCCV, TEE, etc)?   Press F2        :[id number]}    Medication Adjustments/Labs and Tests Ordered: Current medicines are reviewed at length with the patient today.  Concerns regarding medicines are outlined above.  No orders of the defined types were placed in this encounter.  No orders of the defined types were placed in this encounter.   There are no Patient Instructions on file for this visit.   Signed, Emmaline Life, NP  05/09/2022 2:21 PM    [facility]

## 2022-05-10 ENCOUNTER — Emergency Department (HOSPITAL_COMMUNITY): Payer: Medicare Other

## 2022-05-10 ENCOUNTER — Telehealth (HOSPITAL_COMMUNITY): Payer: Self-pay

## 2022-05-10 ENCOUNTER — Inpatient Hospital Stay (HOSPITAL_COMMUNITY)
Admission: EM | Admit: 2022-05-10 | Discharge: 2022-05-17 | DRG: 863 | Disposition: A | Discharge status: Home Health | Payer: Medicare Other | Attending: Internal Medicine | Admitting: Internal Medicine

## 2022-05-10 ENCOUNTER — Encounter (HOSPITAL_COMMUNITY): Payer: Self-pay | Admitting: Emergency Medicine

## 2022-05-10 ENCOUNTER — Encounter (HOSPITAL_COMMUNITY)
Admission: EM | Disposition: A | Discharge status: Home Health | Payer: Self-pay | Source: Home / Self Care | Attending: Interventional Cardiology

## 2022-05-10 ENCOUNTER — Other Ambulatory Visit: Payer: Self-pay

## 2022-05-10 DIAGNOSIS — G4700 Insomnia, unspecified: Secondary | ICD-10-CM | POA: Diagnosis present

## 2022-05-10 DIAGNOSIS — I071 Rheumatic tricuspid insufficiency: Secondary | ICD-10-CM | POA: Diagnosis not present

## 2022-05-10 DIAGNOSIS — Z90710 Acquired absence of both cervix and uterus: Secondary | ICD-10-CM

## 2022-05-10 DIAGNOSIS — I4892 Unspecified atrial flutter: Secondary | ICD-10-CM | POA: Diagnosis not present

## 2022-05-10 DIAGNOSIS — K219 Gastro-esophageal reflux disease without esophagitis: Secondary | ICD-10-CM | POA: Diagnosis present

## 2022-05-10 DIAGNOSIS — F32A Depression, unspecified: Secondary | ICD-10-CM | POA: Diagnosis present

## 2022-05-10 DIAGNOSIS — Z8719 Personal history of other diseases of the digestive system: Secondary | ICD-10-CM

## 2022-05-10 DIAGNOSIS — E669 Obesity, unspecified: Secondary | ICD-10-CM | POA: Diagnosis present

## 2022-05-10 DIAGNOSIS — Z7984 Long term (current) use of oral hypoglycemic drugs: Secondary | ICD-10-CM | POA: Diagnosis not present

## 2022-05-10 DIAGNOSIS — F17210 Nicotine dependence, cigarettes, uncomplicated: Secondary | ICD-10-CM | POA: Diagnosis present

## 2022-05-10 DIAGNOSIS — R42 Dizziness and giddiness: Secondary | ICD-10-CM | POA: Diagnosis present

## 2022-05-10 DIAGNOSIS — E785 Hyperlipidemia, unspecified: Secondary | ICD-10-CM | POA: Diagnosis present

## 2022-05-10 DIAGNOSIS — I480 Paroxysmal atrial fibrillation: Secondary | ICD-10-CM | POA: Diagnosis present

## 2022-05-10 DIAGNOSIS — Z7901 Long term (current) use of anticoagulants: Secondary | ICD-10-CM

## 2022-05-10 DIAGNOSIS — M1990 Unspecified osteoarthritis, unspecified site: Secondary | ICD-10-CM | POA: Diagnosis present

## 2022-05-10 DIAGNOSIS — I309 Acute pericarditis, unspecified: Secondary | ICD-10-CM | POA: Diagnosis present

## 2022-05-10 DIAGNOSIS — G894 Chronic pain syndrome: Secondary | ICD-10-CM | POA: Diagnosis present

## 2022-05-10 DIAGNOSIS — I314 Cardiac tamponade: Secondary | ICD-10-CM | POA: Diagnosis present

## 2022-05-10 DIAGNOSIS — Z9889 Other specified postprocedural states: Secondary | ICD-10-CM

## 2022-05-10 DIAGNOSIS — Y838 Other surgical procedures as the cause of abnormal reaction of the patient, or of later complication, without mention of misadventure at the time of the procedure: Secondary | ICD-10-CM | POA: Diagnosis present

## 2022-05-10 DIAGNOSIS — Z952 Presence of prosthetic heart valve: Secondary | ICD-10-CM

## 2022-05-10 DIAGNOSIS — E1140 Type 2 diabetes mellitus with diabetic neuropathy, unspecified: Secondary | ICD-10-CM | POA: Diagnosis present

## 2022-05-10 DIAGNOSIS — Z885 Allergy status to narcotic agent status: Secondary | ICD-10-CM

## 2022-05-10 DIAGNOSIS — I10 Essential (primary) hypertension: Secondary | ICD-10-CM | POA: Diagnosis present

## 2022-05-10 DIAGNOSIS — I4891 Unspecified atrial fibrillation: Secondary | ICD-10-CM | POA: Diagnosis not present

## 2022-05-10 DIAGNOSIS — Z7982 Long term (current) use of aspirin: Secondary | ICD-10-CM | POA: Diagnosis not present

## 2022-05-10 DIAGNOSIS — M797 Fibromyalgia: Secondary | ICD-10-CM | POA: Diagnosis present

## 2022-05-10 DIAGNOSIS — B182 Chronic viral hepatitis C: Secondary | ICD-10-CM | POA: Diagnosis present

## 2022-05-10 DIAGNOSIS — I483 Typical atrial flutter: Secondary | ICD-10-CM | POA: Diagnosis not present

## 2022-05-10 DIAGNOSIS — Z79899 Other long term (current) drug therapy: Secondary | ICD-10-CM

## 2022-05-10 DIAGNOSIS — Z6829 Body mass index (BMI) 29.0-29.9, adult: Secondary | ICD-10-CM

## 2022-05-10 DIAGNOSIS — D500 Iron deficiency anemia secondary to blood loss (chronic): Secondary | ICD-10-CM

## 2022-05-10 DIAGNOSIS — I340 Nonrheumatic mitral (valve) insufficiency: Secondary | ICD-10-CM | POA: Diagnosis not present

## 2022-05-10 DIAGNOSIS — T8140XA Infection following a procedure, unspecified, initial encounter: Secondary | ICD-10-CM | POA: Diagnosis present

## 2022-05-10 DIAGNOSIS — E1169 Type 2 diabetes mellitus with other specified complication: Secondary | ICD-10-CM

## 2022-05-10 DIAGNOSIS — R579 Shock, unspecified: Secondary | ICD-10-CM | POA: Diagnosis present

## 2022-05-10 DIAGNOSIS — N179 Acute kidney failure, unspecified: Secondary | ICD-10-CM | POA: Diagnosis present

## 2022-05-10 DIAGNOSIS — J90 Pleural effusion, not elsewhere classified: Secondary | ICD-10-CM | POA: Diagnosis present

## 2022-05-10 DIAGNOSIS — Z720 Tobacco use: Secondary | ICD-10-CM | POA: Diagnosis present

## 2022-05-10 DIAGNOSIS — E119 Type 2 diabetes mellitus without complications: Secondary | ICD-10-CM

## 2022-05-10 DIAGNOSIS — I9589 Other hypotension: Secondary | ICD-10-CM | POA: Diagnosis not present

## 2022-05-10 DIAGNOSIS — I3139 Other pericardial effusion (noninflammatory): Secondary | ICD-10-CM

## 2022-05-10 HISTORY — PX: PERICARDIOCENTESIS: CATH118255

## 2022-05-10 LAB — I-STAT CHEM 8, ED
BUN: 21 mg/dL (ref 8–23)
Calcium, Ion: 1.07 mmol/L — ABNORMAL LOW (ref 1.15–1.40)
Chloride: 106 mmol/L (ref 98–111)
Creatinine, Ser: 2.1 mg/dL — ABNORMAL HIGH (ref 0.44–1.00)
Glucose, Bld: 134 mg/dL — ABNORMAL HIGH (ref 70–99)
HCT: 33 % — ABNORMAL LOW (ref 36.0–46.0)
Hemoglobin: 11.2 g/dL — ABNORMAL LOW (ref 12.0–15.0)
Potassium: 4.1 mmol/L (ref 3.5–5.1)
Sodium: 136 mmol/L (ref 135–145)
TCO2: 18 mmol/L — ABNORMAL LOW (ref 22–32)

## 2022-05-10 LAB — CBC WITH DIFFERENTIAL/PLATELET
Abs Immature Granulocytes: 0.06 10*3/uL (ref 0.00–0.07)
Basophils Absolute: 0.1 10*3/uL (ref 0.0–0.1)
Basophils Relative: 1 %
Eosinophils Absolute: 0.2 10*3/uL (ref 0.0–0.5)
Eosinophils Relative: 3 %
HCT: 32.1 % — ABNORMAL LOW (ref 36.0–46.0)
Hemoglobin: 10.3 g/dL — ABNORMAL LOW (ref 12.0–15.0)
Immature Granulocytes: 1 %
Lymphocytes Relative: 27 %
Lymphs Abs: 2.2 10*3/uL (ref 0.7–4.0)
MCH: 28.5 pg (ref 26.0–34.0)
MCHC: 32.1 g/dL (ref 30.0–36.0)
MCV: 88.7 fL (ref 80.0–100.0)
Monocytes Absolute: 0.7 10*3/uL (ref 0.1–1.0)
Monocytes Relative: 9 %
Neutro Abs: 4.9 10*3/uL (ref 1.7–7.7)
Neutrophils Relative %: 59 %
Platelets: 293 10*3/uL (ref 150–400)
RBC: 3.62 MIL/uL — ABNORMAL LOW (ref 3.87–5.11)
RDW: 14.7 % (ref 11.5–15.5)
WBC: 8.2 10*3/uL (ref 4.0–10.5)
nRBC: 0 % (ref 0.0–0.2)

## 2022-05-10 LAB — COMPREHENSIVE METABOLIC PANEL
ALT: 15 U/L (ref 0–44)
AST: 22 U/L (ref 15–41)
Albumin: 3.2 g/dL — ABNORMAL LOW (ref 3.5–5.0)
Alkaline Phosphatase: 125 U/L (ref 38–126)
Anion gap: 10 (ref 5–15)
BUN: 21 mg/dL (ref 8–23)
CO2: 18 mmol/L — ABNORMAL LOW (ref 22–32)
Calcium: 8.1 mg/dL — ABNORMAL LOW (ref 8.9–10.3)
Chloride: 107 mmol/L (ref 98–111)
Creatinine, Ser: 2.1 mg/dL — ABNORMAL HIGH (ref 0.44–1.00)
GFR, Estimated: 26 mL/min — ABNORMAL LOW (ref >60)
Glucose, Bld: 140 mg/dL — ABNORMAL HIGH (ref 70–99)
Potassium: 4.2 mmol/L (ref 3.5–5.1)
Sodium: 135 mmol/L (ref 135–145)
Total Bilirubin: 0.6 mg/dL (ref 0.3–1.2)
Total Protein: 6.2 g/dL — ABNORMAL LOW (ref 6.5–8.1)

## 2022-05-10 LAB — BODY FLUID CELL COUNT WITH DIFFERENTIAL
Eos, Fluid: 0 %
Lymphs, Fluid: 82 %
Monocyte-Macrophage-Serous Fluid: 1 % — ABNORMAL LOW (ref 50–90)
Neutrophil Count, Fluid: 16 % (ref 0–25)
Other Cells, Fluid: 1 %
Total Nucleated Cell Count, Fluid: 722 cu mm (ref 0–1000)

## 2022-05-10 LAB — GLUCOSE, CAPILLARY
Glucose-Capillary: 104 mg/dL — ABNORMAL HIGH (ref 70–99)
Glucose-Capillary: 120 mg/dL — ABNORMAL HIGH (ref 70–99)

## 2022-05-10 LAB — LACTIC ACID, PLASMA
Lactic Acid, Venous: 2.1 mmol/L (ref 0.5–1.9)
Lactic Acid, Venous: 2.6 mmol/L (ref 0.5–1.9)

## 2022-05-10 LAB — LIPASE, BLOOD: Lipase: 39 U/L (ref 11–51)

## 2022-05-10 LAB — GRAM STAIN

## 2022-05-10 LAB — TYPE AND SCREEN
ABO/RH(D): B POS
Antibody Screen: NEGATIVE

## 2022-05-10 LAB — PROTIME-INR
INR: 2.1 — ABNORMAL HIGH (ref 0.8–1.2)
Prothrombin Time: 23.6 seconds — ABNORMAL HIGH (ref 11.4–15.2)

## 2022-05-10 LAB — MRSA NEXT GEN BY PCR, NASAL: MRSA by PCR Next Gen: NOT DETECTED

## 2022-05-10 LAB — CBG MONITORING, ED: Glucose-Capillary: 142 mg/dL — ABNORMAL HIGH (ref 70–99)

## 2022-05-10 LAB — TROPONIN I (HIGH SENSITIVITY): Troponin I (High Sensitivity): 33 ng/L — ABNORMAL HIGH (ref <18)

## 2022-05-10 SURGERY — PERICARDIOCENTESIS
Anesthesia: LOCAL

## 2022-05-10 MED ORDER — GABAPENTIN 600 MG PO TABS
600.0000 mg | ORAL_TABLET | Freq: Three times a day (TID) | ORAL | Status: DC
  Administered 2022-05-10 – 2022-05-17 (×21): 600 mg via ORAL
  Filled 2022-05-10 (×21): qty 1

## 2022-05-10 MED ORDER — HEPARIN (PORCINE) IN NACL 1000-0.9 UT/500ML-% IV SOLN
INTRAVENOUS | Status: AC
  Filled 2022-05-10: qty 500

## 2022-05-10 MED ORDER — SODIUM CHLORIDE 0.9% FLUSH
3.0000 mL | Freq: Two times a day (BID) | INTRAVENOUS | Status: DC
  Administered 2022-05-10 – 2022-05-17 (×14): 3 mL via INTRAVENOUS

## 2022-05-10 MED ORDER — SODIUM CHLORIDE 0.9 % IV BOLUS
1000.0000 mL | Freq: Once | INTRAVENOUS | Status: AC
  Administered 2022-05-10: 1000 mL via INTRAVENOUS

## 2022-05-10 MED ORDER — PHENYLEPHRINE HCL-NACL 20-0.9 MG/250ML-% IV SOLN
INTRAVENOUS | Status: AC
Start: 2022-05-10 — End: 2022-05-10
  Filled 2022-05-10: qty 250

## 2022-05-10 MED ORDER — LIDOCAINE-EPINEPHRINE (PF) 2 %-1:200000 IJ SOLN
INTRAMUSCULAR | Status: AC
  Filled 2022-05-10: qty 20

## 2022-05-10 MED ORDER — PANTOPRAZOLE SODIUM 40 MG PO TBEC
40.0000 mg | DELAYED_RELEASE_TABLET | Freq: Every day | ORAL | Status: DC
  Administered 2022-05-10 – 2022-05-17 (×8): 40 mg via ORAL
  Filled 2022-05-10 (×8): qty 1

## 2022-05-10 MED ORDER — LIDOCAINE HCL (PF) 1 % IJ SOLN
INTRAMUSCULAR | Status: AC
  Filled 2022-05-10: qty 30

## 2022-05-10 MED ORDER — PHENYLEPHRINE 80 MCG/ML (10ML) SYRINGE FOR IV PUSH (FOR BLOOD PRESSURE SUPPORT)
80.0000 ug | PREFILLED_SYRINGE | Freq: Once | INTRAVENOUS | Status: AC | PRN
  Administered 2022-05-10: 80 ug via INTRAVENOUS
  Filled 2022-05-10: qty 10

## 2022-05-10 MED ORDER — ACETAMINOPHEN 325 MG PO TABS
650.0000 mg | ORAL_TABLET | ORAL | Status: DC | PRN
  Administered 2022-05-10 – 2022-05-12 (×3): 650 mg via ORAL
  Filled 2022-05-10 (×4): qty 2

## 2022-05-10 MED ORDER — LABETALOL HCL 5 MG/ML IV SOLN: 10.0000 mg | INTRAVENOUS | Status: AC | PRN

## 2022-05-10 MED ORDER — SODIUM CHLORIDE 0.9% FLUSH
3.0000 mL | INTRAVENOUS | Status: DC | PRN
  Administered 2022-05-16: 3 mL via INTRAVENOUS

## 2022-05-10 MED ORDER — VITAMIN K1 10 MG/ML IJ SOLN
10.0000 mg | INTRAVENOUS | Status: DC
  Filled 2022-05-10: qty 1

## 2022-05-10 MED ORDER — METOPROLOL TARTRATE 25 MG PO TABS
25.0000 mg | ORAL_TABLET | Freq: Two times a day (BID) | ORAL | Status: DC
  Administered 2022-05-10 – 2022-05-12 (×5): 25 mg via ORAL
  Filled 2022-05-10 (×4): qty 1

## 2022-05-10 MED ORDER — ALBUTEROL SULFATE (2.5 MG/3ML) 0.083% IN NEBU
3.0000 mL | INHALATION_SOLUTION | Freq: Four times a day (QID) | RESPIRATORY_TRACT | Status: DC | PRN
Start: 2022-05-10 — End: 2022-05-17

## 2022-05-10 MED ORDER — METOPROLOL TARTRATE 25 MG PO TABS
ORAL_TABLET | ORAL | Status: AC
  Filled 2022-05-10: qty 1

## 2022-05-10 MED ORDER — ONDANSETRON HCL 4 MG/2ML IJ SOLN: 4.0000 mg | Freq: Four times a day (QID) | INTRAMUSCULAR | Status: DC | PRN

## 2022-05-10 MED ORDER — SODIUM CHLORIDE 0.9 % IV SOLN: 250.0000 mL | INTRAVENOUS | Status: DC | PRN

## 2022-05-10 MED ORDER — PROTHROMBIN COMPLEX CONC HUMAN 500 UNITS IV KIT
1558.0000 [IU] | PACK | Status: AC
  Administered 2022-05-10: 1558 [IU] via INTRAVENOUS
  Filled 2022-05-10: qty 552

## 2022-05-10 MED ORDER — BACLOFEN 10 MG PO TABS
10.0000 mg | ORAL_TABLET | Freq: Two times a day (BID) | ORAL | Status: DC
  Administered 2022-05-10 – 2022-05-17 (×15): 10 mg via ORAL
  Filled 2022-05-10 (×18): qty 1

## 2022-05-10 MED ORDER — LIDOCAINE HCL (PF) 1 % IJ SOLN
INTRAMUSCULAR | Status: DC | PRN
  Administered 2022-05-10: 5 mL

## 2022-05-10 MED ORDER — HYDRALAZINE HCL 20 MG/ML IJ SOLN
10.0000 mg | INTRAMUSCULAR | Status: AC | PRN
Start: 2022-05-10 — End: 2022-05-10

## 2022-05-10 MED ORDER — MAGNESIUM HYDROXIDE 400 MG/5ML PO SUSP
30.0000 mL | Freq: Once | ORAL | Status: AC
  Administered 2022-05-10: 30 mL via ORAL
  Filled 2022-05-10: qty 30

## 2022-05-10 MED ORDER — ESTROGENS CONJUGATED 0.45 MG PO TABS
0.4500 mg | ORAL_TABLET | Freq: Every day | ORAL | Status: DC
  Administered 2022-05-12 – 2022-05-17 (×6): 0.45 mg via ORAL
  Filled 2022-05-10 (×6): qty 1

## 2022-05-10 MED ORDER — CHLORHEXIDINE GLUCONATE CLOTH 2 % EX PADS
6.0000 | MEDICATED_PAD | Freq: Every day | CUTANEOUS | Status: DC
  Administered 2022-05-11 – 2022-05-17 (×7): 6 via TOPICAL

## 2022-05-10 MED ORDER — TRAZODONE HCL 50 MG PO TABS
200.0000 mg | ORAL_TABLET | Freq: Every day | ORAL | Status: DC
  Administered 2022-05-10 – 2022-05-16 (×7): 200 mg via ORAL
  Filled 2022-05-10 (×8): qty 4

## 2022-05-10 MED ORDER — SODIUM CHLORIDE 0.9 % IV SOLN
INTRAVENOUS | Status: AC | PRN
  Administered 2022-05-10: 100 mL/h via INTRAVENOUS

## 2022-05-10 MED ORDER — ASPIRIN 81 MG PO TBEC
81.0000 mg | DELAYED_RELEASE_TABLET | Freq: Every day | ORAL | Status: DC
  Administered 2022-05-10 – 2022-05-17 (×8): 81 mg via ORAL
  Filled 2022-05-10 (×8): qty 1

## 2022-05-10 MED ORDER — OXYCODONE HCL 5 MG PO TABS
5.0000 mg | ORAL_TABLET | Freq: Once | ORAL | Status: AC
  Administered 2022-05-10: 5 mg via ORAL
  Filled 2022-05-10: qty 1

## 2022-05-10 MED ORDER — OXYCODONE HCL 5 MG PO TABS
15.0000 mg | ORAL_TABLET | Freq: Four times a day (QID) | ORAL | Status: DC
  Administered 2022-05-10: 10 mg via ORAL
  Administered 2022-05-10 – 2022-05-17 (×27): 15 mg via ORAL
  Filled 2022-05-10 (×28): qty 3

## 2022-05-10 MED ORDER — NOREPINEPHRINE 4 MG/250ML-% IV SOLN
INTRAVENOUS | Status: AC
  Administered 2022-05-10: 50 ug/min
  Filled 2022-05-10: qty 250

## 2022-05-10 MED ORDER — HEPARIN (PORCINE) IN NACL 1000-0.9 UT/500ML-% IV SOLN
INTRAVENOUS | Status: DC | PRN
  Administered 2022-05-10: 500 mL

## 2022-05-10 MED ORDER — BUPROPION HCL ER (XL) 150 MG PO TB24
300.0000 mg | ORAL_TABLET | Freq: Every morning | ORAL | Status: DC
  Administered 2022-05-10 – 2022-05-17 (×8): 300 mg via ORAL
  Filled 2022-05-10 (×9): qty 2

## 2022-05-10 MED ORDER — INSULIN ASPART 100 UNIT/ML IJ SOLN
0.0000 [IU] | Freq: Three times a day (TID) | INTRAMUSCULAR | Status: DC
  Administered 2022-05-14 – 2022-05-17 (×3): 2 [IU] via SUBCUTANEOUS

## 2022-05-10 MED ORDER — SODIUM CHLORIDE 0.9 % IV SOLN: INTRAVENOUS | Status: AC

## 2022-05-10 MED ORDER — ATORVASTATIN CALCIUM 10 MG PO TABS
20.0000 mg | ORAL_TABLET | Freq: Every day | ORAL | Status: DC
  Administered 2022-05-10 – 2022-05-17 (×8): 20 mg via ORAL
  Filled 2022-05-10 (×8): qty 2

## 2022-05-10 SURGICAL SUPPLY — 6 items
KIT HEART LEFT (KITS) ×1 IMPLANT
PACK CARDIAC CATHETERIZATION (CUSTOM PROCEDURE TRAY) ×1 IMPLANT
SHEATH PROBE COVER 6X72 (BAG) ×1 IMPLANT
TRANSDUCER W/STOPCOCK (MISCELLANEOUS) ×2 IMPLANT
TRAY PERICARDIOCENTESIS 6FX60 (TRAY / TRAY PROCEDURE) ×2 IMPLANT
TUBING CIL FLEX 10 FLL-RA (TUBING) ×1 IMPLANT

## 2022-05-10 NOTE — ED Notes (Signed)
2.1 lactic told to Ray MD

## 2022-05-10 NOTE — ED Notes (Signed)
Cardiology at pt bedside, used ultrasound to examine pt heart and confirm positive effusion

## 2022-05-10 NOTE — ED Notes (Signed)
Pt consent to heart catheterization procedure

## 2022-05-10 NOTE — Progress Notes (Signed)
Pt arrived from cath lab to [place]. Pt drowsy but oriented x4. States she feels "a little" better. Afib on monitor, BP in 889V systolic. 3L [place]. Pericardial drain with no output, MD orders to flush q8. Pt has no other needs at this time.

## 2022-05-10 NOTE — H&P (Signed)
Cardiology Admission History and Physical:   Patient ID: Nakya Weyand MRN: [id number]; DOB: 11-26-1961   Admission date: 05/10/2022  Primary Care Provider: [facility] [place] Cardiologist: Werner Lean, MD  [facility] Electrophysiologist:  None   Chief Complaint: chest pain/dizziness  Patient Profile:   [name] is a 61 y.o. female with hypertension, diabetes, fibromyalgia, history of hep C s/p therapy, distant substance abuse, tobacco abuse, pAF and post op AF, severe MR and TR confirmed on TEE with abnormal inflammatory/rheumatic valves now s/p MVR and TV annuloplasty who presents with dizziness and chest pain.   History of Present Illness:   Patient in extremis during my evaluation so history is limited to chart review. She was brought in by [facility] with dizziness and chest pain that started 06/05 AM. On arrival the fire department noted faint pulses and a sBP of 82 which remained consistent despite 500 cc IVFs. Pericardial effusion identified on bedside echo c/w tamponade physiology.   VS on ED arrival: P 75, BP 143/77, RR 14, O2 100%/RA  Appeared to be in acute distress during my evaluation and somnolent to arousal. No FNDs and reporting ongoing dizziness and chest pain. Pericardial effusion on my echo with at least 2 cm circumferential effusion with some fibrinous stranding. Dilated IVC and severely impaired biV fxn 2/2 to effusion. Normal EF prior.   In rate controlled AF with ventricular rates in the 70s, >100 on arrival (108 on ECG). BP 70/40s. With AF attempted to augment BP with both IVFs and phenylephrine pushes. No significant change in BP. CTS evaluated and did not have emergent OR availability. INR 2.1 so ordered for Kcentra as reversal. TV/MV ring but no mechanical valves.   Taken for emergency pericardiocentesis and 400 cc pink/red bloody serous fluid removed. Able to wean off pressors and IVFs following pericardial fluid  removal.   Transferred to [place] for further monitoring. Pericardial drain left in place.   Past Medical History:  Diagnosis Date   Arthritis    Cervicalgia    Chondromalacia of patella    Chronic neck pain    Chronic pain syndrome    Depression    secondary to loss of her son at age 57   Diabetes mellitus    Diabetic neuropathy ([place])    DMII (diabetes mellitus, type 2) ([place])    Enthesopathy of hip region    Fibromyalgia    Hep C w/o coma, chronic ([place])    Hepatitis C    diagnosed 2005   Hypertension    Insomnia    Insomnia    Low back pain    Lumbago    Mitral regurgitation    Obesity    Pain in joint, upper arm    Primary localized osteoarthrosis, lower leg    Substance abuse ([place])    sober since 2001   Tobacco abuse    Tricuspid regurgitation    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   Past Surgical History:  Procedure Laterality Date   ABDOMINAL HYSTERECTOMY     at age 89, unknown reasons   CARPAL TUNNEL RELEASE  12/07/2011   left side   EYE SURGERY     FOOT SURGERY Bilateral    MITRAL VALVE REPAIR N/A 04/19/2022   Procedure: MITRAL VALVE REPAIR WITH 30MM PHYSIO II ANNULOPLASTY RING;  Surgeon: Melrose Nakayama, MD;  Location: [place];  Service: Open Heart Surgery;  Laterality: N/A;   RIGHT/LEFT HEART CATH AND CORONARY ANGIOGRAPHY N/A 02/12/2022  Procedure: RIGHT/LEFT HEART CATH AND CORONARY ANGIOGRAPHY;  Surgeon: Belva Crome, MD;  Location: [place] CV [facility];  Service: Cardiovascular;  Laterality: N/A;   TEE WITHOUT CARDIOVERSION N/A 02/03/2022   Procedure: TRANSESOPHAGEAL ECHOCARDIOGRAM (TEE);  Surgeon: Josue Hector, MD;  Location: [facility] ENDOSCOPY;  Service: Cardiovascular;  Laterality: N/A;   TEE WITHOUT CARDIOVERSION N/A 04/19/2022   Procedure: TRANSESOPHAGEAL ECHOCARDIOGRAM (TEE);  Surgeon: Melrose Nakayama, MD;  Location: [place];  Service: Open Heart Surgery;  Laterality: N/A;   TRICUSPID VALVE REPLACEMENT N/A 04/19/2022   Procedure: TRICUSPID  VALVE REPAIR WITH 32MM MC3 ANNULOPLASTY RING;  Surgeon: Melrose Nakayama, MD;  Location: [place];  Service: Open Heart Surgery;  Laterality: N/A;    Medications Prior to Admission: Prior to Admission medications   Medication Sig Start Date End Date Taking? Authorizing Provider  ACCU-CHEK FASTCLIX LANCETS MISC 1 each by Does not apply route daily. Check blood sugar once daily. 250.00 10/26/11   [name], DO  albuterol (PROVENTIL HFA;VENTOLIN HFA) 108 (90 Base) MCG/ACT inhaler Inhale 2 puffs into the lungs every 6 (six) hours as needed for wheezing or shortness of breath.     [provider]  aspirin 81 MG EC tablet Take 81 mg by mouth daily.    [provider]  atorvastatin (LIPITOR) 20 MG tablet Take 20 mg by mouth daily.    [provider]  baclofen (LIORESAL) 10 MG tablet Take 10 mg by mouth 2 (two) times daily.    [provider]  buPROPion (WELLBUTRIN XL) 300 MG 24 hr tablet Take 300 mg by mouth in the morning. 03/24/22   [provider]  Cyanocobalamin (VITAMIN B 12 PO) Take 50 mcg by mouth.    [provider]  dexlansoprazole (DEXILANT) 60 MG capsule Take 60 mg by mouth daily. 12/18/21   [provider]  diltiazem (CARDIZEM CD) 120 MG 24 hr capsule Take 1 capsule (120 mg total) by mouth daily. 04/29/22   Antony Odea, PA-C  ferrous [id number] C-folic acid (TRINSICON / FOLTRIN) capsule Take 1 capsule by mouth 2 (two) times daily after a meal. 04/28/22   Roddenberry, Myron G, PA-C  FLUoxetine (PROZAC) 20 MG capsule Take 20 mg by mouth every morning. 04/14/22   [provider]  gabapentin (NEURONTIN) 600 MG tablet Take 600 mg by mouth 3 (three) times daily. 10/12/21   [provider]  glucose blood (ACCU-CHEK SMARTVIEW) test strip Check blood sugar once daily. 250.00 10/26/11 12/30/21  [name], DO  hydrOXYzine (ATARAX/VISTARIL) 25 MG tablet Take 25 mg by mouth in the morning,  at noon, in the evening, and at bedtime. 12/21/18   [provider]  linagliptin (TRADJENTA) 5 MG TABS tablet Take 5 mg by mouth in the morning.    [provider]  losartan (COZAAR) 50 MG tablet Take 1 tablet (50 mg total) by mouth in the morning. 04/29/22   Roddenberry, Arlis Porta, PA-C  methocarbamol (ROBAXIN) 500 MG tablet Take 500 mg by mouth 4 (four) times daily.    [provider]  metoprolol tartrate (LOPRESSOR) 25 MG tablet Take 1 tablet (25 mg total) by mouth 2 (two) times daily. 04/28/22   Antony Odea, PA-C  naloxone [facility]) nasal spray 4 mg/0.1 mL Place 1 spray into the nose once.    [provider]  nystatin ointment (MYCOSTATIN) Apply topically 2 (two) times daily. 04/05/22   [provider]  PREMARIN 0.45 MG tablet Take 0.45 mg by  mouth daily. 01/11/22   [provider]  traZODone (DESYREL) 100 MG tablet Take 2.5 tablets (250 mg total) by mouth at bedtime as needed for sleep. Patient taking differently: Take 200 mg by mouth at bedtime. 11/23/18   [name], MD  warfarin (COUMADIN) 2.5 MG tablet Take 1 tablet (2.5 mg total) by mouth every Monday,Wednesday,Friday, and Sunday at 6 PM. 04/28/22   Antony Odea, PA-C  warfarin (COUMADIN) 5 MG tablet Take 1 tablet (5 mg total) by mouth every Tuesday, Thursday, and Saturday at 6 PM. 04/29/22   Antony Odea, PA-C    Allergies:    Allergies  Allergen Reactions   Hydrocodone-Acetaminophen Itching   Hydrocodone Itching    Tolerates oxycodone    Social History:   Social History   Socioeconomic History   Marital status: Single    Spouse name: Not on file   Number of children: 2   Years of education: Not on file   Highest education level: Not on file  Occupational History   Occupation: CNA    Comment: worked for 15 years  Tobacco Use   Smoking status: Every Day    Packs/day: 0.50    Years: 35.00    Pack years: 17.50    Types: Cigarettes   Smokeless  tobacco: Never   Tobacco comments:    4 cigarettes a day  Vaping Use   Vaping Use: Some days  Substance and Sexual Activity   Alcohol use: No   Drug use: No    Types: "Crack" cocaine    Comment: last used Oct 2016   Sexual activity: Not Currently    Comment: Celebate since 2000  Other Topics Concern   Not on file  Social History Narrative   Born and raised by mom until mom died when she was 14yo. After that she stayed with her aunt. Dad was married to someone else and was never around.  Pt has 1 brother and 1 sister and pt is the middle child. Pt had a very rough childhood. Never married. 2 kids one son died several  yrs ago and other son has Schizophrenia. Pt has graduated HS and has some college. Pt is on disability and is unemployed. 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       Financial assistance approved for 100% discount at [facility] [place] and has [facility] card   [employer]  September 29, 2010 2:27 PM   Social Determinants of Health   Financial Resource Strain: Not on file  Food Insecurity: Not on file  Transportation Needs: Not on file  Physical Activity: Not on file  Stress: Not on file  Social Connections: Not on file  Intimate Partner Violence: Not on file    Family History:   The patient's family history includes Alcohol abuse in her brother, father, and sister; Arthritis in an other family member; Diabetes in an other family member; Drug abuse in her brother and sister; Heart attack (age of onset: 65) in her son; Hypertension in an other family member; Schizophrenia in her son; Uterine cancer in her mother.    ROS:   Review of Systems: [y] = yes, '[ ]'$  = no      General: Weight gain '[ ]'$ ; Weight loss '[ ]'$ ; Anorexia '[ ]'$ ; Fatigue '[ ]'$ ; Fever '[ ]'$ ; Chills '[ ]'$ ; Weakness [y] Cardiac: Chest pain/pressure [y]; Resting SOB '[ ]'$ ; Exertional SOB '[ ]'$ ; Orthopnea '[ ]'$ ; Pedal Edema '[ ]'$ ; Palpitations '[ ]'$ ; Syncope '[ ]'$ ;  Presyncope '[ ]'$ ; Paroxysmal nocturnal dyspnea '[ ]'$    Pulmonary: Cough '[ ]'$ ; Wheezing '[ ]'$ ;  Hemoptysis '[ ]'$ ; Sputum '[ ]'$ ; Snoring '[ ]'$    GI: Vomiting '[ ]'$ ; Dysphagia '[ ]'$ ; Melena '[ ]'$ ; Hematochezia '[ ]'$ ; Heartburn '[ ]'$ ; Abdominal pain '[ ]'$ ; Constipation '[ ]'$ ; Diarrhea '[ ]'$ ; BRBPR '[ ]'$    GU: Hematuria '[ ]'$ ; Dysuria '[ ]'$ ; Nocturia '[ ]'$  Vascular: Pain in legs with walking '[ ]'$ ; Pain in feet with lying flat '[ ]'$ ; Non-healing sores '[ ]'$ ; Stroke '[ ]'$ ; TIA '[ ]'$ ; Slurred speech '[ ]'$ ;   Neuro: Headaches '[ ]'$ ; Vertigo '[ ]'$ ; Seizures '[ ]'$ ; Paresthesias '[ ]'$ ;Blurred vision '[ ]'$ ; Diplopia '[ ]'$ ; Vision changes '[ ]'$   Dizziness [y]   Ortho/Skin: Arthritis '[ ]'$ ; Joint pain '[ ]'$ ; Muscle pain '[ ]'$ ; Joint swelling '[ ]'$ ; Back Pain '[ ]'$ ; Rash '[ ]'$    Psych: Depression '[ ]'$ ; Anxiety '[ ]'$    Heme: Bleeding problems '[ ]'$ ; Clotting disorders '[ ]'$ ; Anemia '[ ]'$    Endocrine: Diabetes '[ ]'$ ; Thyroid dysfunction '[ ]'$    Physical Exam/Data:   Vitals:   05/10/22 0758 05/10/22 0800 05/10/22 0803 05/10/22 0805  BP: 107/88 133/90 (!) 152/80 (!) 143/77  Pulse: 75 74 75 75  Resp: '13 14 14 14  '$ SpO2:  100%    Weight:      Height:        Intake/Output Summary (Last 24 hours) at 05/10/2022 0813 Last data filed at 05/10/2022 4196 Gross per 24 hour  Intake 2000 ml  Output --  Net 2000 ml      05/10/2022    6:15 AM 05/02/2022    2:22 PM 04/28/2022    7:00 AM  Last 3 Weights  Weight (lbs) 212 lb 204 lb 201 lb 4.5 oz  Weight (kg) 96.163 kg 92.534 kg 91.3 kg     Body mass index is 29.57 kg/m.  General: acute distress, somnolent but responsive  HEENT: normal Lymph: no adenopathy Neck: JVD not assessed Endocrine:  No thryomegaly Vascular: No carotid bruits; all pulses faint  Cardiac: irregular rhythm, regular rate Lungs: clear to auscultation bilaterally, no wheezing, rhonchi or rales  Abd: soft, nontender, no hepatomegaly  Ext: no edema Musculoskeletal:  No deformities, BUE and BLE strength normal and equal Skin: warm and dry  Neuro:  CNs 2-12 intact, no focal abnormalities noted Psych: ill appearing   EKG:  The ECG that was done (05/10/22 at 06:01:08)  was personally reviewed and demonstrates typical AFL with variable block, ventricular rate 108 bpm, QRS 104 ms, Qtc 521 ms, no ischemic changes   Relevant CV Studies:  TEE (Post op) Result date: 22/29/79 Complications: No known complications during this procedure.  POST-OP IMPRESSIONS  _ Left Ventricle: The left ventricle is unchanged from pre-bypass.  _ Right Ventricle: The right ventricle appears unchanged from pre-bypass.  _ Aorta: The aorta appears unchanged from pre-bypass.  _ Left Atrium: The left atrium appears unchanged from pre-bypass.  _ Left Atrial Appendage: The left atrial appendage appears unchanged from  pre-bypass.  _ Aortic Valve: The aortic valve appears unchanged from pre-bypass.  _ Mitral Valve: There is trace regurgitation. The gradient recorded across  the  prosthetic valve is within the expected range, measuring 3 cm/s. The  mitral  valve is status post repair with an annuloplasty ring.  _ Tricuspid Valve: There is mild-moderate regurgitation.  _ Pulmonic Valve: The pulmonic valve appears unchanged from pre-bypass.  _ Interatrial Septum: The interatrial  septum appears unchanged from  pre-bypass.  _ Interventricular Septum: The interventricular septum appears unchanged  from  pre-bypass.  _ Pericardium: The pericardium appears unchanged from pre-bypass.   Laboratory Data:  High Sensitivity Troponin:   Recent Labs  Lab 05/02/22 1422 05/02/22 1700 05/10/22 0603  TROPONINIHS 68* 64* 33*      Chemistry Recent Labs  Lab 05/10/22 0603 05/10/22 0611  NA 135 136  K 4.2 4.1  CL 107 106  CO2 18*  --   GLUCOSE 140* 134*  BUN 21 21  CREATININE 2.10* 2.10*  CALCIUM 8.1*  --   GFRNONAA 26*  --   ANIONGAP 10  --     Recent Labs  Lab 05/10/22 0603  PROT 6.2*  ALBUMIN 3.2*  AST 22  ALT 15  ALKPHOS 125  BILITOT 0.6   Hematology Recent Labs  Lab 05/10/22 0603 05/10/22 0611  WBC 8.2  --   RBC 3.62*  --   HGB 10.3* 11.2*  HCT 32.1* 33.0*  MCV  88.7  --   MCH 28.5  --   MCHC 32.1  --   RDW 14.7  --   PLT 293  --    BNPNo results for input(s): BNP, PROBNP in the last 168 hours.  DDimer No results for input(s): DDIMER in the last 168 hours.  Radiology/Studies:  DG Chest Port 1 View  Result Date: 05/10/2022 CLINICAL DATA:  61 year old female with chest pain. EXAM: PORTABLE CHEST 1 VIEW COMPARISON:  Portable chest 05/02/2022 and earlier. FINDINGS: Portable AP semi upright view at 0610 hours. Stable cardiomegaly and mediastinal contours. Chronic prosthetic cardiac valves. Prior sternotomy. Visualized tracheal air column is within normal limits. Allowing for portable technique the lungs are clear. No pneumothorax. No acute osseous abnormality identified. IMPRESSION: No acute cardiopulmonary abnormality. Electronically Signed   By: Genevie Ann M.D.   On: 05/10/2022 06:27    Assessment and Plan:   Cardiac tamponade Pericardial effusion  Severe MR/TR now s/p MVR and TV annuloplasty  400 cc blood serous fluid drained. Did not appear completely hemorrhagic so may just be post op pericarditis leading to pericardial effusion. Holding warfarin until drain removed. Ideally on warfarin 3 months post op. INR 2.1 and has not bee supratx. Kcentra given to normalize INR for drain. Once recovered then resume warfarin.  - continue home ASA 81 mg daily  - hold warfarin as above  HTN - hold home losartan 50 mg daily peri-shock and recovering following pressors for tamponade   AF/AFL Distant prior pAF and recent AF post op. OP monitor on 01/21/22 without atrial arrhythmias. Severe LA dilation in 01/2022 however also in the setting of severe MR prior to repair. She likely will require an AAD to maintain NSR. Tx as above to see if AFL converts to NSR, currently rate controlled with low dose metoprolol. If persistent will need to schedule cardioversion once she has recovered from pericardial tamponade and can tolerate OAC again. May need short course of  amiodarone if she starts to have AFL/AF with RVR again and/or for DCCV as OP.  - continue home metoprolol tartrate 25 mg bid  - hold dilt CD 120 mg daily  - holding warfarin   HLD - continue home atorva 20 mg daily   GERD - continue home protonix 40 mg daily   Insomnia - continue home trazodone 200 mg qhs   Severity of Illness: The appropriate patient status for this patient is INPATIENT. Inpatient status is judged to be reasonable  and necessary in order to provide the required intensity of service to ensure the patient's safety. The patient's presenting symptoms, physical exam findings, and initial radiographic and laboratory data in the context of their chronic comorbidities is felt to place them at high risk for further clinical deterioration. Furthermore, it is not anticipated that the patient will be medically stable for discharge from the hospital within 2 midnights of admission.   * I certify that at the point of admission it is my clinical judgment that the patient will require inpatient hospital care spanning beyond 2 midnights from the point of admission due to high intensity of service, high risk for further deterioration and high frequency of surveillance required.*   For questions or updates, please contact [place] Please consult [email or web address] for contact info under   Signed, Dion Body, MD  05/10/2022 8:13 AM

## 2022-05-10 NOTE — ED Notes (Signed)
Pt stated to this RN that she is full code

## 2022-05-10 NOTE — ED Notes (Signed)
Pressure bag applied to pt's normal saline liter bolus

## 2022-05-10 NOTE — Consult Note (Addendum)
[address]       [place] [address]             [phone]        Karysa Terriquez [facility] Medical Record #[id number] Date of Birth: [birth year]  Referring: Jettie Booze, MD Primary Care: [facility] Primary Cardiologist:Mahesh Ernst Spell, MD  Chief Complaint:    Chief Complaint  Patient presents with   Chest Pain   Dizziness    History of Present Illness:     [name] is a pleasant 61 year old female known to our practice.  She was initially referred to Dr. Roxan Hockey in April of this year by Dr. 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  Surgical repair and possible replacement discussed with her by Dr. Roxan Hockey and she eventually went on to have mitral valve repair and tricuspid valve repair on 04/19/2022.  Postoperative course was significant for atrial fibrillation and atrial flutter.  She was discharged on 04/28/2022 to [facility].  She was in atrial flutter at the time of discharge with controlled ventricular response managed with diltiazem.  She was brought back to the emergency room about 4 days later for evaluation of chest pain.  At that time, troponins were flat.  EKG showed no acute ischemic changes.  Her INR was 1.8.  Chest x-ray had no significant abnormality and specifically no pleural effusion and no pneumothorax.  She was felt to have reproducible chest wall pain and was returned to [facility] facility without being admitted to the hospital. She was brought to the ED this morning by [facility] with complaints of chest pain with dizziness.  She was reported to have a faint pulse by the EMS team and hypotension with a blood pressure of 84 systolic despite being given 500 mL bolus of saline.  EKG in the ED which again showed atrial flutter with controlled ventricular response.  Troponin was not significantly  elevated.  CBC was unremarkable.  Creatinine was elevated at 2.1.  Chest x-ray showed cardiomegaly.  She went on to have an echocardiogram demonstrating a large pericardial effusion with tamponade physiology.  She has been anticoagulated for the prosthetic annuloplasty rings this last month so her INR on admission was 2.1. Cardiology was consulted.  Decision was made to proceed with pericardiocentesis.  The INR was corrected with Kcentra.  She went on to have a successful pericardiocentesis draining about 400 mL of thin serous fluid.  She had immediate hemodynamic improvement.  She has been admitted by Dr.  Sheliah Mends the ICU.  Currently, [name] is resting comfortably.  She is sleepy but awakens easily.  She denies any pain or shortness of breath.    Current Activity/ Functional Status: Patient is independent with mobility/ambulation, transfers, ADL's, IADL's.   Zubrod Score: At the time of surgery this patient's most appropriate activity status/level should be described as: '[]'$     0    Normal activity, no symptoms '[]'$     1    Restricted in physical strenuous activity but ambulatory, able to do out light work '[]'$     2    Ambulatory and capable of self care, unable to do work activities, up and about                 more than 50%  Of the time                            '[]'$   3    Only limited self care, in bed greater than 50% of waking hours '[]'$     4    Completely disabled, no self care, confined to bed or chair '[]'$     5    Moribund  Past Medical History:  Diagnosis Date   Arthritis    Cervicalgia    Chondromalacia of patella    Chronic neck pain    Chronic pain syndrome    Depression    secondary to loss of her son at age 61   Diabetes mellitus    Diabetic neuropathy ([place])    DMII (diabetes mellitus, type 2) ([place])    Enthesopathy of hip region    Fibromyalgia    Hep C w/o coma, chronic ([place])    Hepatitis C    diagnosed 2005   Hypertension    Insomnia    Insomnia    Low back  pain    Lumbago    Mitral regurgitation    Obesity    Pain in joint, upper arm    Primary localized osteoarthrosis, lower leg    Substance abuse ([place])    sober since 2001   Tobacco abuse    Tricuspid regurgitation    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    Past Surgical History:  Procedure Laterality Date   ABDOMINAL HYSTERECTOMY     at age 45, unknown reasons   CARPAL TUNNEL RELEASE  12/07/2011   left side   EYE SURGERY     FOOT SURGERY Bilateral    MITRAL VALVE REPAIR N/A 04/19/2022   Procedure: MITRAL VALVE REPAIR WITH 30MM PHYSIO II ANNULOPLASTY RING;  Surgeon: Melrose Nakayama, MD;  Location: [facility];  Service: Open Heart Surgery;  Laterality: N/A;   PERICARDIOCENTESIS N/A 05/10/2022   Procedure: PERICARDIOCENTESIS;  Surgeon: Jettie Booze, MD;  Location: [facility];  Service: Cardiovascular;  Laterality: N/A;   RIGHT/LEFT HEART CATH AND CORONARY ANGIOGRAPHY N/A 02/12/2022   Procedure: RIGHT/LEFT HEART CATH AND CORONARY ANGIOGRAPHY;  Surgeon: Belva Crome, MD;  Location: [facility];  Service: Cardiovascular;  Laterality: N/A;   TEE WITHOUT CARDIOVERSION N/A 02/03/2022   Procedure: TRANSESOPHAGEAL ECHOCARDIOGRAM (TEE);  Surgeon: Josue Hector, MD;  Location: [facility];  Service: Cardiovascular;  Laterality: N/A;   TEE WITHOUT CARDIOVERSION N/A 04/19/2022   Procedure: TRANSESOPHAGEAL ECHOCARDIOGRAM (TEE);  Surgeon: Melrose Nakayama, MD;  Location: [facility];  Service: Open Heart Surgery;  Laterality: N/A;   TRICUSPID VALVE REPLACEMENT N/A 04/19/2022   Procedure: TRICUSPID VALVE REPAIR WITH 32MM MC3 ANNULOPLASTY RING;  Surgeon: Melrose Nakayama, MD;  Location: [facility];  Service: Open Heart Surgery;  Laterality: N/A;    Social History   Tobacco Use  Smoking Status Every Day   Packs/day: 0.50   Years: 35.00   Pack years: 17.50   Types: Cigarettes  Smokeless Tobacco Never  Tobacco Comments   4 cigarettes a day    Social History    Substance and Sexual Activity  Alcohol Use No     Allergies  Allergen Reactions   Hydrocodone-Acetaminophen Itching   Hydrocodone Itching    Tolerates oxycodone     Current Facility-Administered Medications  Medication Dose Route Frequency Provider Last Rate Last Admin   0.9 %  sodium chloride infusion  250 mL Intravenous PRN Jettie Booze, MD       acetaminophen (TYLENOL) tablet 650 mg  650 mg Oral Q4H PRN Jettie Booze, MD  albuterol (PROVENTIL) (2.5 MG/3ML) 0.083% nebulizer solution 3 mL  3 mL Inhalation Q6H PRN Jettie Booze, MD       aspirin EC tablet 81 mg  81 mg Oral Daily Jettie Booze, MD   81 mg at 05/10/22 1015   atorvastatin (LIPITOR) tablet 20 mg  20 mg Oral Daily Jettie Booze, MD   20 mg at 05/10/22 1015   baclofen (LIORESAL) tablet 10 mg  10 mg Oral BID Jettie Booze, MD   10 mg at 05/10/22 1015   buPROPion (WELLBUTRIN XL) 24 hr tablet 300 mg  300 mg Oral q AM Jettie Booze, MD   300 mg at 05/10/22 1015   [START ON 05/12/2022] estrogens (conjugated) (PREMARIN) tablet 0.45 mg  0.45 mg Oral Daily Jettie Booze, MD       gabapentin (NEURONTIN) tablet 600 mg  600 mg Oral TID Jettie Booze, MD       hydrALAZINE (APRESOLINE) injection 10 mg  10 mg Intravenous Q20 Min PRN Jettie Booze, MD       labetalol (NORMODYNE) injection 10 mg  10 mg Intravenous Q10 min PRN Jettie Booze, MD       lidocaine-EPINEPHrine (XYLOCAINE W/EPI) 2 %-1:200000 (PF) injection            metoprolol tartrate (LOPRESSOR) 25 MG tablet            metoprolol tartrate (LOPRESSOR) tablet 25 mg  25 mg Oral BID Jettie Booze, MD   25 mg at 05/10/22 1015   ondansetron (ZOFRAN) injection 4 mg  4 mg Intravenous Q6H PRN Jettie Booze, MD       pantoprazole (PROTONIX) EC tablet 40 mg  40 mg Oral Daily Jettie Booze, MD   40 mg at 05/10/22 1015   phenylephrine (NEOSYNEPHRINE) 20-0.9 MG/250ML-% infusion             sodium chloride flush (NS) 0.9 % injection 3 mL  3 mL Intravenous Q12H Jettie Booze, MD       sodium chloride flush (NS) 0.9 % injection 3 mL  3 mL Intravenous PRN Jettie Booze, MD       traZODone (DESYREL) tablet 200 mg  200 mg Oral QHS Jettie Booze, MD        Facility-Administered Medications Prior to Admission  Medication Dose Route Frequency Provider Last Rate Last Admin   triamcinolone acetonide (KENALOG) 10 MG/ML injection 10 mg  10 mg Other Once Evelina Bucy, DPM       triamcinolone acetonide (KENALOG-40) injection 40 mg  40 mg Other Once Mohammed Kindle, MD       Medications Prior to Admission  Medication Sig Dispense Refill Last Dose   albuterol (PROVENTIL HFA;VENTOLIN HFA) 108 (90 Base) MCG/ACT inhaler Inhale 2 puffs into the lungs every 6 (six) hours as needed for wheezing or shortness of breath.    unk   aspirin 81 MG EC tablet Take 81 mg by mouth daily.   05/09/2022   ACCU-CHEK FASTCLIX LANCETS MISC 1 each by Does not apply route daily. Check blood sugar once daily. 250.00 102 each 4    atorvastatin (LIPITOR) 20 MG tablet Take 20 mg by mouth daily.      baclofen (LIORESAL) 10 MG tablet Take 10 mg by mouth 2 (two) times daily.      buPROPion (WELLBUTRIN XL) 300 MG 24 hr tablet Take 300 mg by mouth in the morning.  Cyanocobalamin (VITAMIN B 12 PO) Take 50 mcg by mouth.      dexlansoprazole (DEXILANT) 60 MG capsule Take 60 mg by mouth daily.      diltiazem (CARDIZEM CD) 120 MG 24 hr capsule Take 1 capsule (120 mg total) by mouth daily.      ferrous [id number] C-folic acid (TRINSICON / FOLTRIN) capsule Take 1 capsule by mouth 2 (two) times daily after a meal.      FLUoxetine (PROZAC) 20 MG capsule Take 20 mg by mouth every morning.      gabapentin (NEURONTIN) 600 MG tablet Take 600 mg by mouth 3 (three) times daily.      glucose blood (ACCU-CHEK SMARTVIEW) test strip Check blood sugar once daily. 250.00 50 each 12    hydrOXYzine  (ATARAX/VISTARIL) 25 MG tablet Take 25 mg by mouth in the morning, at noon, in the evening, and at bedtime.      linagliptin (TRADJENTA) 5 MG TABS tablet Take 5 mg by mouth in the morning.      losartan (COZAAR) 50 MG tablet Take 1 tablet (50 mg total) by mouth in the morning.      methocarbamol (ROBAXIN) 500 MG tablet Take 500 mg by mouth 4 (four) times daily.      metoprolol tartrate (LOPRESSOR) 25 MG tablet Take 1 tablet (25 mg total) by mouth 2 (two) times daily.      naloxone (NARCAN) nasal spray 4 mg/0.1 mL Place 1 spray into the nose once.      nystatin ointment (MYCOSTATIN) Apply topically 2 (two) times daily.      oxyCODONE (ROXICODONE) 15 MG immediate release tablet Take 15 mg by mouth every 4 (four) hours as needed for pain.      PREMARIN 0.45 MG tablet Take 0.45 mg by mouth daily.      traZODone (DESYREL) 100 MG tablet Take 2.5 tablets (250 mg total) by mouth at bedtime as needed for sleep. (Patient taking differently: Take 200 mg by mouth at bedtime.) 225 tablet 0    warfarin (COUMADIN) 2.5 MG tablet Take 1 tablet (2.5 mg total) by mouth every Monday,Wednesday,Friday, and Sunday at 6 PM.      warfarin (COUMADIN) 5 MG tablet Take 1 tablet (5 mg total) by mouth every Tuesday, Thursday, and Saturday at 73 PM.       Family History  Problem Relation Age of Onset   Uterine cancer Mother    Alcohol abuse Father    Alcohol abuse Sister    Drug abuse Sister    Drug abuse Brother    Alcohol abuse Brother    Schizophrenia Son    Diabetes Other    Arthritis Other    Hypertension Other    Heart attack Son 41       Died suddenly     Review of Systems:      Cardiac Review of Systems: Y or  [    ]= no  Chest Pain [  x  ]  Resting SOB [   ] Exertional SOB  [ x ]  Orthopnea [  ]   Pedal Edema [   ]    Palpitations [  ] Syncope  [  ]   Presyncope [   ]  General Review of Systems: [Y] = yes [  ]=no Constitional: recent weight change [  ]; anorexia [  ]; fatigue [  ]; nausea [  ]; night  sweats [  ]; fever [  ]; or chills [  ]  Eye : blurred vision [  ]; diplopia [   ]; vision changes [  ];  Amaurosis fugax[  ]; Resp: cough [  ];  wheezing[  ];  hemoptysis[  ]; shortness of breath[ x ]; paroxysmal nocturnal dyspnea[  ]; dyspnea on exertion[ x ]; or orthopnea[  ];  GI:  gallstones[  ], vomiting[  ];  dysphagia[  ]; melena[  ];  hematochezia [  ]; heartburn[  ];   Hx of  Colonoscopy[  ]; GU: kidney stones [  ]; hematuria[  ];   dysuria [  ];  nocturia[  ];  history of     obstruction [  ]; urinary frequency [  ]             Skin: rash, swelling[  ];, hair loss[  ];  peripheral edema[  ];  or itching[  ]; Musculosketetal: myalgias[  ];  joint swelling[  ];  joint erythema[  ];  joint pain[  ];  back pain[  ];  Heme/Lymph: bruising[  ];  bleeding[  ];  anemia[  ];  Neuro: TIA[  ];  headaches[  ];  stroke[  ];  vertigo[  ];  seizures[  ];   paresthesias[  ];  difficulty walking[  ];  Psych:depression[  ]; anxiety[  ];  Endocrine: diabetes[  ];  thyroid dysfunction[  ];              Physical Exam: BP (!) 141/80   Pulse 75   Temp 97.7 F (36.5 C) (Oral)   Resp 16   Ht '5\' 11"'$  (1.803 m)   Wt 96.2 kg   LMP 12/06/1976   SpO2 100%   BMI 29.57 kg/m    General appearance: Sleepy but awakens easily.  Mental status appropriate. Head: Normocephalic, without obvious abnormality, atraumatic Neck: no carotid bruit and no JVD Resp: Breath sounds are full, equal, and clear to auscultation. Cardio: Irregularly irregular rhythm.  Monitor shows atrial flutter with variable conduction.  Ventricular rate is in the mid 80s.  Split S2, no murmur.  A small bore drainage catheter exits the subxiphoid skin.  It is attached to a closed suction wound drainage system.  There is minimal fluid in the canister. GI: Soft, nontender Extremities: Well perfused with easily palpable distal pulses.  No peripheral edema Neurologic: Grossly  normal  Diagnostic Studies & Laboratory data:     Recent Radiology Findings:   CARDIAC CATHETERIZATION  Result Date: 05/10/2022   Successful pericardiocentesis with removal of 450 cc of serosanguineous fluid. Fluid was not overtly bloody.  I suspect the fluid was more related to postsurgical pericarditis.  Cannot start NSAIDs at this point given her acute renal failure.  Hopefully, her renal function will improve now that her hemodynamics are better.  As her creatinine improves, consider increasing treatment for pericarditis.  We will try to dose colchicine for her renal function.  We will leave the drain in overnight.  Effusion was essentially gone by echocardiogram postprocedure, after the fluid removal noted above.  Plan for limited echo tomorrow.  Remove drain as determined by fluid accumulation. Discussed with Dr. Roxan Hockey. Results conveyed to bother, Jeneen Rinks, who is listed as the emergency contact.  He lives out of town but was going to inform the sister who lives here locally.   DG Chest Port 1 View  Result Date: 05/10/2022 CLINICAL DATA:  61 year old female with chest pain. EXAM: PORTABLE CHEST 1 VIEW COMPARISON:  Portable chest 05/02/2022 and earlier.  FINDINGS: Portable AP semi upright view at 0610 hours. Stable cardiomegaly and mediastinal contours. Chronic prosthetic cardiac valves. Prior sternotomy. Visualized tracheal air column is within normal limits. Allowing for portable technique the lungs are clear. No pneumothorax. No acute osseous abnormality identified. IMPRESSION: No acute cardiopulmonary abnormality. Electronically Signed   By: Genevie Ann M.D.   On: 05/10/2022 06:27   ECHOCARDIOGRAM LIMITED  Result Date: 05/10/2022    ECHOCARDIOGRAM LIMITED REPORT   Patient Name:   [name] Date of Exam: 05/10/2022 Medical Rec #:  [id number]         Height:       71.0 in Accession #:    [id number]        Weight:       212.0 lb Date of Birth:  [birth year]         BSA:          2.161 m Patient  Age:    39 years          BP:           134/70 mmHg Patient Gender: F                 HR:           86 bpm. Exam Location:  Inpatient Procedure: Limited Echo                      STAT ECHO Reported to: Dr Berniece Salines on 05/10/2022 8:27:00 AM. Indications:    Pericardiocentesis  History:        Patient has prior history of Echocardiogram examinations, most                 recent 04/19/2022. Arrythmias:Atrial Fibrillation; Risk                 Factors:Former Smoker.  Sonographer:    Clayton Lefort RDCS (AE) Referring Phys: [place]  1. Right ventricle does not relax completely suggesting increase in intrapericardial pressure.Subtle appreciation for ventricular interdependence. Full study for tamponade physiology not performed: Tissue doppler, MV/TV respiratory variation and IVC. Moderate pericardial effusion. The pericardial effusion is circumferential.  2. Left ventricular ejection fraction, by estimation, is 55 to 60%. The left ventricle has normal function. Left ventricular diastolic function could not be evaluated.  3. Right ventricular systolic function is normal.  4. The mitral valve is grossly normal.  5. The aortic valve was not well visualized. FINDINGS  Left Ventricle: Left ventricular ejection fraction, by estimation, is 55 to 60%. The left ventricle has normal function. Left ventricular diastolic function could not be evaluated. Right Ventricle: Right ventricular systolic function is normal. Left Atrium: Left atrial size was not assessed. Right Atrium: Right atrial size was not assessed. Pericardium: Right ventricle does not relax completely suggesting increase in intrapericardial pressure.Subtle appreciation for ventricular interdependence. Full study for tamponade physiology not performed: Tissue doppler, MV/TV respiratory variation and IVC. A moderately sized pericardial effusion is present. The pericardial effusion is circumferential. Mitral Valve: The mitral valve is grossly normal.  Tricuspid Valve: The tricuspid valve is grossly normal. Aortic Valve: The aortic valve was not well visualized. Pulmonic Valve: The pulmonic valve was not well visualized. Aorta: The ascending aorta was not well visualized and the aortic root was not well visualized. Venous: The inferior vena cava was not well visualized. Berniece Salines DO Electronically signed by Berniece Salines DO Signature Date/Time: 05/10/2022/10:43:00 AM    Final  I have independently reviewed the above radiologic studies and discussed with the patient   Recent Lab Findings: Lab Results  Component Value Date   WBC 8.2 05/10/2022   HGB 11.2 (L) 05/10/2022   HCT 33.0 (L) 05/10/2022   PLT 293 05/10/2022   GLUCOSE 134 (H) 05/10/2022   CHOL 180 03/25/2016   TRIG 122 03/25/2016   HDL 39 (L) 03/25/2016   LDLCALC 117 03/25/2016   ALT 15 05/10/2022   AST 22 05/10/2022   NA 136 05/10/2022   K 4.1 05/10/2022   CL 106 05/10/2022   CREATININE 2.10 (H) 05/10/2022   BUN 21 05/10/2022   CO2 18 (L) 05/10/2022   TSH 1.75 03/25/2016   INR 2.1 (H) 05/10/2022   HGBA1C 6.1 (H) 04/15/2022      Assessment / Plan:      -61 year old female well-known to [place] having had mitral and tricuspid valve repairs by Dr. Roxan Hockey on 04/19/2022.  She was discharged to [facility] inpatient rehab center on 524.  She presented earlier today via EMS with complaint of chest pain and dizziness and was discovered to have a large pericardial effusion with tamponade physiology.  Successful pericardiocentesis was performed after reversal of the Coumadin with a K-Centra.  She is hemodynamically stable. She has mild renal insufficiency that will likely resolve following pericardiocentesis.  Anticoagulation is to be resumed after observation overnight and if no further intervention is required.  We appreciate the care provided to [name] by the cardiology team.  We will follow along.  I  spent 20 minutes counseling the patient face to face.  Antony Odea, PA-C  05/10/2022 1:08 PM  Patient seen and examined in ED this AM and just now in ICU Came into the ED in shock and found to have a large pericardial effusion D/ w Dr. Irish Lack and he was able to drain the effusion with immediate hemodynamic improvement She is now resting comfortably She is in A flutter with controlled rate  Remo Lipps C. Roxan Hockey, MD [facility] [phone]

## 2022-05-10 NOTE — Telephone Encounter (Signed)
Pt insurance is active and benefits verified through [employer]. Co-pay $0.00, DED $0.00/$0.00 met, out of pocket $8,300.00/$1,644.34 met, co-insurance 20%. No pre-authorization required. Passport, 05/10/22 @ 11:01AM, REF#[id number]   How many CR sessions are covered? (36 sessions for TCR, 72 sessions for ICR)N/A Is this a lifetime maximum or an annual maximum? N/A Has the member used any of these services to date? N/A Is there a time limit (weeks/months) on start of program and/or program completion? N/A     Will contact patient to see if she is interested in the Cardiac Rehab Program. If interested, patient will need to complete follow up appt. Once completed, patient will be contacted for scheduling upon review by the RN Navigator. 

## 2022-05-10 NOTE — ED Notes (Signed)
ED provider at bedside assessed pt heart with ultrasound machine, stated that pericardial effusion is detected.

## 2022-05-10 NOTE — ED Notes (Signed)
Pt transported to cath lab at this time.

## 2022-05-10 NOTE — Progress Notes (Incomplete)
Progress Note  Patient Name: [name] Date of Encounter: 05/10/2022  [facility] Cardiologist: Werner Lean, MD ***  Subjective   ***  Inpatient Medications    Scheduled Meds:  aspirin EC  81 mg Oral Daily   atorvastatin  20 mg Oral Daily   baclofen  10 mg Oral BID   buPROPion  300 mg Oral q AM   estrogens (conjugated)  0.45 mg Oral Daily   gabapentin  600 mg Oral TID   lidocaine-EPINEPHrine       metoprolol tartrate  25 mg Oral BID   pantoprazole  40 mg Oral Daily   phenylephrine       sodium chloride flush  3 mL Intravenous Q12H   traZODone  200 mg Oral QHS   Continuous Infusions:  sodium chloride 75 mL/hr at 05/10/22 0902   sodium chloride     PRN Meds: sodium chloride, acetaminophen, albuterol, hydrALAZINE, labetalol, ondansetron (ZOFRAN) IV, sodium chloride flush   Vital Signs    Vitals:   05/10/22 0758 05/10/22 0800 05/10/22 0803 05/10/22 0805  BP: 107/88 133/90 (!) 152/80 (!) 143/77  Pulse: 75 74 75 75  Resp: '13 14 14 14  '$ SpO2:  100%    Weight:      Height:        Intake/Output Summary (Last 24 hours) at 05/10/2022 0943 Last data filed at 05/10/2022 1245 Gross per 24 hour  Intake 2000 ml  Output --  Net 2000 ml      05/10/2022    6:15 AM 05/02/2022    2:22 PM 04/28/2022    7:00 AM  Last 3 Weights  Weight (lbs) 212 lb 204 lb 201 lb 4.5 oz  Weight (kg) 96.163 kg 92.534 kg 91.3 kg      Telemetry    *** - Personally Reviewed  ECG    *** - Personally Reviewed  Physical Exam  *** GEN: No acute distress.   Neck: No JVD Cardiac: RRR, no murmurs, rubs, or gallops.  Respiratory: Clear to auscultation bilaterally. GI: Soft, nontender, non-distended  MS: No edema; No deformity. Neuro:  Nonfocal  Psych: Normal affect   Labs    High Sensitivity Troponin:   Recent Labs  Lab 05/02/22 1422 05/02/22 1700 05/10/22 0603  TROPONINIHS 68* 64* 33*     Chemistry Recent Labs  Lab 05/10/22 0603 05/10/22 0611  NA 135 136  K  4.2 4.1  CL 107 106  CO2 18*  --   GLUCOSE 140* 134*  BUN 21 21  CREATININE 2.10* 2.10*  CALCIUM 8.1*  --   PROT 6.2*  --   ALBUMIN 3.2*  --   AST 22  --   ALT 15  --   ALKPHOS 125  --   BILITOT 0.6  --   GFRNONAA 26*  --   ANIONGAP 10  --     Lipids No results for input(s): CHOL, TRIG, HDL, LABVLDL, LDLCALC, CHOLHDL in the last 168 hours.  Hematology Recent Labs  Lab 05/10/22 0603 05/10/22 0611  WBC 8.2  --   RBC 3.62*  --   HGB 10.3* 11.2*  HCT 32.1* 33.0*  MCV 88.7  --   MCH 28.5  --   MCHC 32.1  --   RDW 14.7  --   PLT 293  --    Thyroid No results for input(s): TSH, FREET4 in the last 168 hours.  BNPNo results for input(s): BNP, PROBNP in the last 168 hours.  DDimer No  results for input(s): DDIMER in the last 168 hours.   Radiology    CARDIAC CATHETERIZATION  Result Date: 05/10/2022   Successful pericardiocentesis with removal of 450 cc of serosanguineous fluid. Fluid was not overtly bloody.  I suspect the fluid was more related to postsurgical pericarditis.  Cannot start NSAIDs at this point given her acute renal failure.  Hopefully, her renal function will improve now that her hemodynamics are better.  As her creatinine improves, consider increasing treatment for pericarditis.  We will try to dose colchicine for her renal function.  We will leave the drain in overnight.  Effusion was essentially gone by echocardiogram postprocedure, after the fluid removal noted above.  Plan for limited echo tomorrow.  Remove drain as determined by fluid accumulation. Discussed with Dr. Roxan Hockey. Results conveyed to bother, Jeneen Rinks, who is listed as the emergency contact.  He lives out of town but was going to inform the sister who lives here locally.   DG Chest Port 1 View  Result Date: 05/10/2022 CLINICAL DATA:  61 year old female with chest pain. EXAM: PORTABLE CHEST 1 VIEW COMPARISON:  Portable chest 05/02/2022 and earlier. FINDINGS: Portable AP semi upright view at 0610 hours.  Stable cardiomegaly and mediastinal contours. Chronic prosthetic cardiac valves. Prior sternotomy. Visualized tracheal air column is within normal limits. Allowing for portable technique the lungs are clear. No pneumothorax. No acute osseous abnormality identified. IMPRESSION: No acute cardiopulmonary abnormality. Electronically Signed   By: Genevie Ann M.D.   On: 05/10/2022 06:27    Cardiac Studies   ***  Patient Profile     61 y.o. 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  Assessment & Plan    Pericardial Tamponade post recent MV and Tricuspid valve surgery: on warfarin therapy *** MVR Tricuspid annuloplasty Anticoagulation Atrial fibrillation  For questions or updates, please contact [facility] Please consult [email or web address] for contact info under        Signed, Sinclair Grooms, MD  05/10/2022, 9:43 AM

## 2022-05-10 NOTE — ED Notes (Signed)
Cardiology at pt bedside for arterial line placement

## 2022-05-10 NOTE — ED Provider Notes (Signed)
[facility] EMERGENCY DEPARTMENT Provider Note   CSN: [id number] Arrival date & time: 05/10/22  5284     History  Chief Complaint  Patient presents with   Chest Pain   Dizziness    [name] is a 61 y.o. female.  The history is provided by the patient, the EMS personnel and medical records.  Chest Pain Associated symptoms: dizziness   Dizziness Associated symptoms: chest pain   [name] is a 61 y.o. female who presents to the Emergency Department complaining of dizziness and chest pain.  Level 5 caveat due to acuity of condition.  History is provided by EMS, patient and the medical record.  She is status post mitral valve and tricuspid valve repair on May 15.  She was discharged from the hospital to rehab facility and went from rehab facility to home on June 2.  She states that she has had pain since the repair but became sicker today.  She cannot state when.  She states that she is dizzy feels ill with shortness of breath, worsening chest pain, nausea.  She is compliant with her medications but is unsure what they are.  EMS reports a blood pressure of 82 systolic.  They gave her 500 cc of fluid with no change in her blood pressure.    Home Medications Prior to Admission medications   Medication Sig Start Date End Date Taking? Authorizing Provider  ACCU-CHEK FASTCLIX LANCETS MISC 1 each by Does not apply route daily. Check blood sugar once daily. 250.00 10/26/11   [name], DO  albuterol (PROVENTIL HFA;VENTOLIN HFA) 108 (90 Base) MCG/ACT inhaler Inhale 2 puffs into the lungs every 6 (six) hours as needed for wheezing or shortness of breath.     [provider]  aspirin 81 MG EC tablet Take 81 mg by mouth daily.    [provider]  atorvastatin (LIPITOR) 20 MG tablet Take 20 mg by mouth daily.    [provider]  baclofen (LIORESAL) 10 MG tablet Take 10 mg by mouth 2 (two) times daily.    [provider]   buPROPion (WELLBUTRIN XL) 300 MG 24 hr tablet Take 300 mg by mouth in the morning. 03/24/22   [provider]  Cyanocobalamin (VITAMIN B 12 PO) Take 50 mcg by mouth.    [provider]  dexlansoprazole (DEXILANT) 60 MG capsule Take 60 mg by mouth daily. 12/18/21   [provider]  diltiazem (CARDIZEM CD) 120 MG 24 hr capsule Take 1 capsule (120 mg total) by mouth daily. 04/29/22   Antony Odea, PA-C  ferrous [id number] C-folic acid (TRINSICON / FOLTRIN) capsule Take 1 capsule by mouth 2 (two) times daily after a meal. 04/28/22   Roddenberry, Myron G, PA-C  FLUoxetine (PROZAC) 20 MG capsule Take 20 mg by mouth every morning. 04/14/22   [provider]  gabapentin (NEURONTIN) 600 MG tablet Take 600 mg by mouth 3 (three) times daily. 10/12/21   [provider]  glucose blood (ACCU-CHEK SMARTVIEW) test strip Check blood sugar once daily. 250.00 10/26/11 12/30/21  [name], DO  hydrOXYzine (ATARAX/VISTARIL) 25 MG tablet Take 25 mg by mouth in the morning, at noon, in the evening, and at bedtime. 12/21/18   [provider]  linagliptin (TRADJENTA) 5 MG TABS tablet Take 5 mg by mouth in the morning.    [provider]  losartan (COZAAR) 50 MG tablet Take 1 tablet (50 mg total) by mouth in the morning.  04/29/22   Antony Odea, PA-C  methocarbamol (ROBAXIN) 500 MG tablet Take 500 mg by mouth 4 (four) times daily.    [provider]  metoprolol tartrate (LOPRESSOR) 25 MG tablet Take 1 tablet (25 mg total) by mouth 2 (two) times daily. 04/28/22   Antony Odea, PA-C  naloxone [facility]) nasal spray 4 mg/0.1 mL Place 1 spray into the nose once.    [provider]  nystatin ointment (MYCOSTATIN) Apply topically 2 (two) times daily. 04/05/22   [provider]  PREMARIN 0.45 MG tablet Take 0.45 mg by mouth daily. 01/11/22   [provider]  traZODone (DESYREL) 100 MG tablet Take 2.5  tablets (250 mg total) by mouth at bedtime as needed for sleep. Patient taking differently: Take 200 mg by mouth at bedtime. 11/23/18   [name], MD  warfarin (COUMADIN) 2.5 MG tablet Take 1 tablet (2.5 mg total) by mouth every Monday,Wednesday,Friday, and Sunday at 6 PM. 04/28/22   Antony Odea, PA-C  warfarin (COUMADIN) 5 MG tablet Take 1 tablet (5 mg total) by mouth every Tuesday, Thursday, and Saturday at 6 PM. 04/29/22   Roddenberry, Arlis Porta, PA-C      Allergies    Hydrocodone-acetaminophen and Hydrocodone    Review of Systems   Review of Systems  Cardiovascular:  Positive for chest pain.  Neurological:  Positive for dizziness.  All other systems reviewed and are negative.  Physical Exam Updated Vital Signs BP (!) 143/77   Pulse 75   Resp 14   Ht '5\' 11"'$  (1.803 m)   Wt 96.2 kg   LMP 12/06/1976   SpO2 100%   BMI 29.57 kg/m  Physical Exam Vitals and nursing note reviewed.  Constitutional:      General: She is in acute distress.     Appearance: She is well-developed. She is ill-appearing.     Comments: Lethargic  HENT:     Head: Normocephalic and atraumatic.  Cardiovascular:     Rate and Rhythm: Normal rate. Rhythm irregular.     Comments: Muffled heart sounds Pulmonary:     Effort: Pulmonary effort is normal. No respiratory distress.     Breath sounds: Normal breath sounds.     Comments: Sternotomy incision site healed Abdominal:     Palpations: Abdomen is soft.     Tenderness: There is no guarding or rebound.     Comments: Mild epigastric tenderness  Musculoskeletal:        General: No tenderness.  Skin:    General: Skin is dry.     Coloration: Skin is pale.     Comments: Cool extremities  Neurological:     Comments: Oriented to person, place.  Generalized weakness    ED Results / Procedures / Treatments   Labs (all labs ordered are listed, but only abnormal results are displayed) Labs Reviewed  COMPREHENSIVE METABOLIC PANEL - Abnormal;  Notable for the following components:      Result Value   CO2 18 (*)    Glucose, Bld 140 (*)    Creatinine, Ser 2.10 (*)    Calcium 8.1 (*)    Total Protein 6.2 (*)    Albumin 3.2 (*)    GFR, Estimated 26 (*)    All other components within normal limits  PROTIME-INR - Abnormal; Notable for the following components:   Prothrombin Time 23.6 (*)    INR 2.1 (*)    All other components within normal limits  CBC WITH DIFFERENTIAL/PLATELET - Abnormal;  Notable for the following components:   RBC 3.62 (*)    Hemoglobin 10.3 (*)    HCT 32.1 (*)    All other components within normal limits  LACTIC ACID, PLASMA - Abnormal; Notable for the following components:   Lactic Acid, Venous 2.1 (*)    All other components within normal limits  I-STAT CHEM 8, ED - Abnormal; Notable for the following components:   Creatinine, Ser 2.10 (*)    Glucose, Bld 134 (*)    Calcium, Ion 1.07 (*)    TCO2 18 (*)    Hemoglobin 11.2 (*)    HCT 33.0 (*)    All other components within normal limits  CBG MONITORING, ED - Abnormal; Notable for the following components:   Glucose-Capillary 142 (*)    All other components within normal limits  TROPONIN I (HIGH SENSITIVITY) - Abnormal; Notable for the following components:   Troponin I (High Sensitivity) 33 (*)    All other components within normal limits  BODY FLUID CULTURE W GRAM STAIN  LIPASE, BLOOD  LACTIC ACID, PLASMA  BODY FLUID CELL COUNT WITH DIFFERENTIAL  GLUCOSE, BODY FLUID OTHER            PROTEIN, BODY FLUID (OTHER)  LD, BODY FLUID (OTHER)  PROTIME-INR  TYPE AND SCREEN  CYTOLOGY - NON PAP  TROPONIN I (HIGH SENSITIVITY)    EKG EKG Interpretation  Date/Time:  Monday May 10 2022 06:01:08 EDT Ventricular Rate:  108 PR Interval:    QRS Duration: 104 QT Interval:  388 QTC Calculation: 521 R Axis:   26 Text Interpretation: Atrial flutter Low voltage, precordial leads Minimal ST elevation, inferior leads Prolonged QT interval Confirmed by Quintella Reichert [phone]) on 05/10/2022 6:28:32 AM  Radiology DG Chest Port 1 View  Result Date: 05/10/2022 CLINICAL DATA:  61 year old female with chest pain. EXAM: PORTABLE CHEST 1 VIEW COMPARISON:  Portable chest 05/02/2022 and earlier. FINDINGS: Portable AP semi upright view at 0610 hours. Stable cardiomegaly and mediastinal contours. Chronic prosthetic cardiac valves. Prior sternotomy. Visualized tracheal air column is within normal limits. Allowing for portable technique the lungs are clear. No pneumothorax. No acute osseous abnormality identified. IMPRESSION: No acute cardiopulmonary abnormality. Electronically Signed   By: Genevie Ann M.D.   On: 05/10/2022 06:27    Procedures Procedures     EMERGENCY DEPARTMENT [place] CARDIAC EXAM "Study: Limited Ultrasound of the Heart and Pericardium"  INDICATIONS:Chest pain Multiple views of the heart and pericardium were obtained in real-time with a multi-frequency probe.  PERFORMED DG:UYQIHK IMAGES ARCHIVED?: No LIMITATIONS:  Emergent procedure VIEWS USED: Subcostal 4 chamber, Parasternal long axis, Apical 4 chamber , and Inferior Vena Cava INTERPRETATION: Cardiac activity present, Pericardial effusion present, Cardiac tamponade present, Decreased contractility, and IVC dilated  CRITICAL CARE Performed by: Quintella Reichert   Total critical care time: 60 minutes  Critical care time was exclusive of separately billable procedures and treating other patients.  Critical care was necessary to treat or prevent imminent or life-threatening deterioration.  Critical care was time spent personally by me on the following activities: development of treatment plan with patient and/or surrogate as well as nursing, discussions with consultants, evaluation of patient's response to treatment, examination of patient, obtaining history from patient or surrogate, ordering and performing treatments and interventions, ordering and review of laboratory studies, ordering and review  of radiographic studies, pulse oximetry and re-evaluation of patient's condition.   Medications Ordered in ED Medications  phenylephrine (NEOSYNEPHRINE) 20-0.9 MG/250ML-% infusion (has no administration in  time range)  lidocaine-EPINEPHrine (XYLOCAINE W/EPI) 2 %-1:200000 (PF) injection (has no administration in time range)  lidocaine (PF) (XYLOCAINE) 1 % injection (5 mLs Infiltration Given 05/10/22 0744)  Heparin (Porcine) in NaCl 1000-0.9 UT/500ML-% SOLN (500 mLs  Given 05/10/22 0758)  0.9 %  sodium chloride infusion (10 mL/hr  Rate/Dose Change 05/10/22 0806)  sodium chloride 0.9 % bolus 1,000 mL (0 mLs Intravenous Stopped 05/10/22 0702)  sodium chloride 0.9 % bolus 1,000 mL (0 mLs Intravenous Stopped 05/10/22 0651)  PHENYLephrine 80 mcg/ml in normal saline Adult IV Push Syringe (For Blood Pressure Support) (80 mcg Intravenous Given 05/10/22 0647)  prothrombin complex conc human (KCENTRA) IVPB 1,558 Units (0 Units Intravenous Stopped 05/10/22 0806)  norepinephrine (LEVOPHED) 4-5 MG/250ML-% infusion SOLN (0 mcg/kg/min  Stopped 05/10/22 0801)    ED Course/ Medical Decision Making/ A&P                           Medical Decision Making Amount and/or Complexity of Data Reviewed Labs: ordered. Radiology: ordered.  Risk Prescription drug management. Decision regarding hospitalization.   Patient 20 days status post mitral and tricuspid valve repair here for evaluation of chest pain, dizziness.  Patient ill-appearing on evaluation with poor perfusion, hypotension.  Bedside ultrasound with large pericardial effusion concerning for tamponade physiology.  She was treated with IV fluids for blood pressure support.  No significant change in her blood pressure with IV fluid administration and she was started on phenylephrine.  Discussed with Dr. Caffie Pinto with CT surgery-recommends formal echo.  CT surgery will team will come to evaluate the patient.  Also recommends cardiology consult.  Labs with AKI, improving  hemoglobin, INR of 2.1.  Cardiology consulted-Dr. Renella Cunas evaluated the patient in the emergency department.  Plan to take emergently to the Cath Lab for pericardiocentesis and further stabilization.        Final Clinical Impression(s) / ED Diagnoses Final diagnoses:  Cardiac tamponade    Rx / DC Orders ED Discharge Orders     None         Quintella Reichert, MD 05/10/22 [phone]

## 2022-05-10 NOTE — ED Triage Notes (Signed)
Pt bib [facility] from home for dizziness and chest pain that began this AM, hx open-heart surgery. FD noted faint pulses on arrival. EMS Palpated pressure 82, remains 82 after 500cc fluid.

## 2022-05-10 NOTE — ED Notes (Signed)
BP 64/48 (55), 277mg phenylephrine given IV per cardiology at bedside

## 2022-05-10 NOTE — ED Notes (Signed)
Per cardiology at pt bedside, verbal order 119mg additional Phenylephrine given to pt

## 2022-05-10 NOTE — Progress Notes (Signed)
CT surgery PM rounds  Patient resting comfortably after pericardiocentesis earlier today in the Cath Lab Recorded output in the ICU 190 cc from pericardial drain Blood pressure stable Urine output 400 cc  Blood pressure 109/67, pulse 75, temperature 97.7 F (36.5 C), temperature source Oral, resp. rate 17, height '5\' 11"'$  (1.803 m), weight 96.2 kg, last menstrual period 12/06/1976, SpO2 96 %.

## 2022-05-10 NOTE — Progress Notes (Signed)
  Echocardiogram 2D Echocardiogram has been performed.  [name] 05/10/2022, 8:31 AM

## 2022-05-10 NOTE — ED Notes (Signed)
Per cardiology verbal order, additional 283mg phylephrine given to pt. BP 66/45 (53)

## 2022-05-11 ENCOUNTER — Ambulatory Visit: Payer: Medicare Other | Admitting: Nurse Practitioner

## 2022-05-11 DIAGNOSIS — Z952 Presence of prosthetic heart valve: Secondary | ICD-10-CM

## 2022-05-11 DIAGNOSIS — I483 Typical atrial flutter: Secondary | ICD-10-CM

## 2022-05-11 DIAGNOSIS — I340 Nonrheumatic mitral (valve) insufficiency: Secondary | ICD-10-CM

## 2022-05-11 DIAGNOSIS — I480 Paroxysmal atrial fibrillation: Secondary | ICD-10-CM | POA: Diagnosis not present

## 2022-05-11 DIAGNOSIS — I10 Essential (primary) hypertension: Secondary | ICD-10-CM

## 2022-05-11 DIAGNOSIS — I071 Rheumatic tricuspid insufficiency: Secondary | ICD-10-CM

## 2022-05-11 DIAGNOSIS — I314 Cardiac tamponade: Secondary | ICD-10-CM | POA: Diagnosis not present

## 2022-05-11 DIAGNOSIS — I9589 Other hypotension: Secondary | ICD-10-CM | POA: Diagnosis not present

## 2022-05-11 DIAGNOSIS — Z9889 Other specified postprocedural states: Secondary | ICD-10-CM

## 2022-05-11 LAB — BASIC METABOLIC PANEL
Anion gap: 6 (ref 5–15)
BUN: 16 mg/dL (ref 8–23)
CO2: 19 mmol/L — ABNORMAL LOW (ref 22–32)
Calcium: 7.5 mg/dL — ABNORMAL LOW (ref 8.9–10.3)
Chloride: 114 mmol/L — ABNORMAL HIGH (ref 98–111)
Creatinine, Ser: 1.1 mg/dL — ABNORMAL HIGH (ref 0.44–1.00)
GFR, Estimated: 57 mL/min — ABNORMAL LOW (ref >60)
Glucose, Bld: 100 mg/dL — ABNORMAL HIGH (ref 70–99)
Potassium: 4.1 mmol/L (ref 3.5–5.1)
Sodium: 139 mmol/L (ref 135–145)

## 2022-05-11 LAB — PROTIME-INR
INR: 1.6 — ABNORMAL HIGH (ref 0.8–1.2)
Prothrombin Time: 18.7 seconds — ABNORMAL HIGH (ref 11.4–15.2)

## 2022-05-11 LAB — GLUCOSE, CAPILLARY
Glucose-Capillary: 110 mg/dL — ABNORMAL HIGH (ref 70–99)
Glucose-Capillary: 106 mg/dL — ABNORMAL HIGH (ref 70–99)
Glucose-Capillary: 87 mg/dL (ref 70–99)
Glucose-Capillary: 127 mg/dL — ABNORMAL HIGH (ref 70–99)

## 2022-05-11 LAB — LD, BODY FLUID (OTHER): LD, Body Fluid: 307 IU/L

## 2022-05-11 LAB — PROTEIN, BODY FLUID (OTHER): Total Protein, Body Fluid Other: 5.4 g/dL

## 2022-05-11 LAB — CYTOLOGY - NON PAP

## 2022-05-11 LAB — GLUCOSE, BODY FLUID OTHER: Glucose, Body Fluid Other: 134 mg/dL

## 2022-05-11 MED ORDER — COLCHICINE 0.6 MG PO TABS
0.6000 mg | ORAL_TABLET | Freq: Two times a day (BID) | ORAL | Status: DC
  Administered 2022-05-11 – 2022-05-17 (×13): 0.6 mg via ORAL
  Filled 2022-05-11 (×13): qty 1

## 2022-05-11 NOTE — Progress Notes (Signed)
   Per telemetry-- patient had HR in the 130s around 10 AM today. Was given metoprolol 25 mg at 0953. HR now in the 70s-100s, remains in afib. Will continue metoprolol 25 mg for now and monitor HR.   Margie Billet, PA-C 05/11/2022 11:30 AM

## 2022-05-11 NOTE — Progress Notes (Signed)
1 Day Post-Op Procedure(s) (LRB): PERICARDIOCENTESIS (N/A) Subjective: Feels better this AM  Objective: Vital signs in last 24 hours: Temp:  [97.7 F (36.5 C)-98.7 F (37.1 C)] 97.8 F (36.6 C) (06/06 0650) Pulse Rate:  [61-99] 84 (06/06 0600) Cardiac Rhythm: Atrial fibrillation;Atrial flutter (06/06 0000) Resp:  [8-33] 11 (06/06 0600) BP: (89-178)/(60-140) 102/82 (06/06 0600) SpO2:  [96 %-100 %] 98 % (06/06 0600) Weight:  [99.5 kg] 99.5 kg (06/06 0630)  Hemodynamic parameters for last 24 hours:    Intake/Output from previous day: 06/05 0701 - 06/06 0700 In: 1730 [P.O.:720; IV Piggyback:1000] Out: 2778 [Urine:1110; Chest Tube:475] Intake/Output this shift: No intake/output data recorded.  General appearance: alert, cooperative, and no distress Neurologic: intact Heart: irregular, + rub Lungs: clear to auscultation bilaterally  Lab Results: Recent Labs    05/10/22 0603 05/10/22 0611  WBC 8.2  --   HGB 10.3* 11.2*  HCT 32.1* 33.0*  PLT 293  --    BMET:  Recent Labs    05/10/22 0603 05/10/22 0611 05/11/22 0206  NA 135 136 139  K 4.2 4.1 4.1  CL 107 106 114*  CO2 18*  --  19*  GLUCOSE 140* 134* 100*  BUN '21 21 16  '$ CREATININE 2.10* 2.10* 1.10*  CALCIUM 8.1*  --  7.5*    PT/INR:  Recent Labs    05/11/22 0206  LABPROT 18.7*  INR 1.6*   ABG    Component Value Date/Time   PHART 7.319 (L) 04/19/2022 2202   HCO3 20.9 04/19/2022 2202   TCO2 18 (L) 05/10/2022 0611   ACIDBASEDEF 5.0 (H) 04/19/2022 2202   O2SAT 99 04/19/2022 2202   CBG (last 3)  Recent Labs    05/10/22 1737 05/10/22 2144 05/11/22 0648  GLUCAP 104* 120* 110*    Assessment/Plan: S/P Procedure(s) (LRB): PERICARDIOCENTESIS (N/A) Looks good this morning In atrial fib with rates up to 130 with activity- will defer med Rx to Cardiology Effusion likely postpericardiotomy - will start colchicine Renal function improved with relief of tamponade INR 1.6- Ok from my standpoint to resume  warfarin when [facility] with Cardiology   LOS: 1 day    Melrose Nakayama 05/11/2022

## 2022-05-11 NOTE — Progress Notes (Addendum)
Progress Note  Patient Name: [name] Date of Encounter: 05/11/2022  [facility] Cardiologist: Werner Lean, MD   Subjective   Patient presented with chest pain and dizziness status post recent MVR and TVA found to be in shock with pericardial effusion by bedside echo consistent with tamponade.  No available OR room for emergent case and patient underwent emergent pericardiocentesis of 400 cc pink/red bloody serous fluid.  Now off pressors.  285 cc were drained out overnight from pericardial drain.  Inpatient Medications    Scheduled Meds:  aspirin EC  81 mg Oral Daily   atorvastatin  20 mg Oral Daily   baclofen  10 mg Oral BID   buPROPion  300 mg Oral q AM   Chlorhexidine Gluconate Cloth  6 each Topical Daily   colchicine  0.6 mg Oral BID   [START ON 05/12/2022] estrogens (conjugated)  0.45 mg Oral Daily   gabapentin  600 mg Oral TID   insulin aspart  0-15 Units Subcutaneous TID WC   metoprolol tartrate  25 mg Oral BID   oxyCODONE  15 mg Oral QID   pantoprazole  40 mg Oral Daily   sodium chloride flush  3 mL Intravenous Q12H   traZODone  200 mg Oral QHS   Continuous Infusions:  sodium chloride     PRN Meds: sodium chloride, acetaminophen, albuterol, ondansetron (ZOFRAN) IV, sodium chloride flush   Vital Signs    Vitals:   05/11/22 0600 05/11/22 0630 05/11/22 0650 05/11/22 0800  BP: 102/82   96/64  Pulse: 84   90  Resp: 11   18  Temp:   97.8 F (36.6 C)   TempSrc:   Oral   SpO2: 98%   97%  Weight:  99.5 kg    Height:  '5\' 11"'$  (1.803 m)      Intake/Output Summary (Last 24 hours) at 05/11/2022 0917 Last data filed at 05/11/2022 0530 Gross per 24 hour  Intake 730 ml  Output 1585 ml  Net -855 ml      05/11/2022    6:30 AM 05/10/2022    6:15 AM 05/02/2022    2:22 PM  Last 3 Weights  Weight (lbs) 219 lb 5.7 oz 212 lb 204 lb  Weight (kg) 99.5 kg 96.163 kg 92.534 kg      Telemetry    Atrial flutter with heart rate in the 80s with occasional  PVCs- Personally Reviewed  ECG    No new EKG to review- Personally Reviewed  Physical Exam   GEN: No acute distress.   Neck: No JVD Cardiac: RRR, no murmurs, rubs, or gallops.  Respiratory: Clear to auscultation bilaterally. GI: Soft, nontender, non-distended  MS: No edema; No deformity. Neuro:  Nonfocal  Psych: Normal affect   Labs    High Sensitivity Troponin:   Recent Labs  Lab 05/02/22 1422 05/02/22 1700 05/10/22 0603  TROPONINIHS 68* 64* 33*      Chemistry Recent Labs  Lab 05/10/22 0603 05/10/22 0611 05/11/22 0206  NA 135 136 139  K 4.2 4.1 4.1  CL 107 106 114*  CO2 18*  --  19*  GLUCOSE 140* 134* 100*  BUN '21 21 16  '$ CREATININE 2.10* 2.10* 1.10*  CALCIUM 8.1*  --  7.5*  PROT 6.2*  --   --   ALBUMIN 3.2*  --   --   AST 22  --   --   ALT 15  --   --   ALKPHOS 125  --   --  BILITOT 0.6  --   --   GFRNONAA 26*  --  57*  ANIONGAP 10  --  6     Hematology Recent Labs  Lab 05/10/22 0603 05/10/22 0611  WBC 8.2  --   RBC 3.62*  --   HGB 10.3* 11.2*  HCT 32.1* 33.0*  MCV 88.7  --   MCH 28.5  --   MCHC 32.1  --   RDW 14.7  --   PLT 293  --     BNPNo results for input(s): BNP, PROBNP in the last 168 hours.   DDimer No results for input(s): DDIMER in the last 168 hours.   CHA2DS2-VASc Score = 3   This indicates a 3.2% annual risk of stroke. The patient's score is based upon: CHF History: 0 HTN History: 1 Diabetes History: 1 Stroke History: 0 Vascular Disease History: 0 Age Score: 0 Gender Score: 1  Radiology    CARDIAC CATHETERIZATION  Result Date: 05/10/2022   Successful pericardiocentesis with removal of 450 cc of serosanguineous fluid. Fluid was not overtly bloody.  I suspect the fluid was more related to postsurgical pericarditis.  Cannot start NSAIDs at this point given her acute renal failure.  Hopefully, her renal function will improve now that her hemodynamics are better.  As her creatinine improves, consider increasing treatment  for pericarditis.  We will try to dose colchicine for her renal function.  We will leave the drain in overnight.  Effusion was essentially gone by echocardiogram postprocedure, after the fluid removal noted above.  Plan for limited echo tomorrow.  Remove drain as determined by fluid accumulation. Discussed with Dr. Roxan Hockey. Results conveyed to bother, Jeneen Rinks, who is listed as the emergency contact.  He lives out of town but was going to inform the sister who lives here locally.   DG Chest Port 1 View  Result Date: 05/10/2022 CLINICAL DATA:  61 year old female with chest pain. EXAM: PORTABLE CHEST 1 VIEW COMPARISON:  Portable chest 05/02/2022 and earlier. FINDINGS: Portable AP semi upright view at 0610 hours. Stable cardiomegaly and mediastinal contours. Chronic prosthetic cardiac valves. Prior sternotomy. Visualized tracheal air column is within normal limits. Allowing for portable technique the lungs are clear. No pneumothorax. No acute osseous abnormality identified. IMPRESSION: No acute cardiopulmonary abnormality. Electronically Signed   By: Genevie Ann M.D.   On: 05/10/2022 06:27   ECHOCARDIOGRAM LIMITED  Result Date: 05/10/2022    ECHOCARDIOGRAM LIMITED REPORT   Patient Name:   [name] Date of Exam: 05/10/2022 Medical Rec #:  [id number]         Height:       71.0 in Accession #:    [id number]        Weight:       212.0 lb Date of Birth:  1961/08/04         BSA:          2.161 m Patient Age:    61 years          BP:           134/70 mmHg Patient Gender: F                 HR:           86 bpm. Exam Location:  Inpatient Procedure: Limited Echo                      STAT ECHO Reported to: Dr Berniece Salines on 05/10/2022 8:27:00 AM. Indications:  Pericardiocentesis  History:        Patient has prior history of Echocardiogram examinations, most                 recent 04/19/2022. Arrythmias:Atrial Fibrillation; Risk                 Factors:Former Smoker.  Sonographer:    Clayton Lefort RDCS (AE) Referring Phys:  [place]  1. Right ventricle does not relax completely suggesting increase in intrapericardial pressure.Subtle appreciation for ventricular interdependence. Full study for tamponade physiology not performed: Tissue doppler, MV/TV respiratory variation and IVC. Moderate pericardial effusion. The pericardial effusion is circumferential.  2. Left ventricular ejection fraction, by estimation, is 55 to 60%. The left ventricle has normal function. Left ventricular diastolic function could not be evaluated.  3. Right ventricular systolic function is normal.  4. The mitral valve is grossly normal.  5. The aortic valve was not well visualized. FINDINGS  Left Ventricle: Left ventricular ejection fraction, by estimation, is 55 to 60%. The left ventricle has normal function. Left ventricular diastolic function could not be evaluated. Right Ventricle: Right ventricular systolic function is normal. Left Atrium: Left atrial size was not assessed. Right Atrium: Right atrial size was not assessed. Pericardium: Right ventricle does not relax completely suggesting increase in intrapericardial pressure.Subtle appreciation for ventricular interdependence. Full study for tamponade physiology not performed: Tissue doppler, MV/TV respiratory variation and IVC. A moderately sized pericardial effusion is present. The pericardial effusion is circumferential. Mitral Valve: The mitral valve is grossly normal. Tricuspid Valve: The tricuspid valve is grossly normal. Aortic Valve: The aortic valve was not well visualized. Pulmonic Valve: The pulmonic valve was not well visualized. Aorta: The ascending aorta was not well visualized and the aortic root was not well visualized. Venous: The inferior vena cava was not well visualized. Godfrey Pick Tobb DO Electronically signed by Berniece Salines DO Signature Date/Time: 05/10/2022/10:43:00 AM    Final     Cardiac Studies   2D echo 05/10/22 IMPRESSIONS    1. Right ventricle does not relax  completely suggesting increase in  intrapericardial pressure.Subtle appreciation for ventricular  interdependence. Full study for tamponade physiology not performed: Tissue  doppler, MV/TV respiratory variation and IVC.  Moderate pericardial effusion. The pericardial effusion is  circumferential.   2. Left ventricular ejection fraction, by estimation, is 55 to 60%. The  left ventricle has normal function. Left ventricular diastolic function  could not be evaluated.   3. Right ventricular systolic function is normal.   4. The mitral valve is grossly normal.   5. The aortic valve was not well visualized.   Patient Profile     61 y.o. female with hypertension, diabetes, fibromyalgia, history of hep C s/p therapy, distant substance abuse, tobacco abuse, pAF and post op AF, severe MR and TR confirmed on TEE with abnormal inflammatory/rheumatic valves now s/p MVR and TV annuloplasty who presents with dizziness and chest pain.   Assessment & Plan    Cardiac tamponade/Pericardial effusion  -Patient presented to ER in shock and found to have pericardial effusion with tamponade on bedside echo -Status post recent MVR/TVA and likely redo to postop pericarditis leading to effusion -Emergent OR was not available on patient arrival in the ER and therefore patient underwent emergent pericardiocentesis of 400 cc of pink/bloody fluid -Pericardial drain output 200 cc overnight -continue to follow drain output and DC pericardial catheter once output has significantly dropped off  -Warfarin on hold -Started on colchicine 0.6 mg twice  daily  Severe MR/TR  -s/p MVR and TV annuloplasty  400 cc blood serous fluid drained. Did not appear completely hemorrhagic so may just be post op pericarditis leading to pericardial effusion.  -Holding warfarin until drain removed.  -Ideally on warfarin 3 months post op.  -INR 2.1 on admission and Kcentra given to normalize INR for drain.  INR 1.6 today -Once recovered  then resume warfarin.  -continue home ASA 81 mg daily    3.  HTN -BPs remain soft 96/64 mmHg this morning due to recent shock -Continue to hold losartan, metoprolol and Cardizem  4.  AF/AFL -Distant prior pAF and recent AF post op.  -OP monitor on 01/21/22 without atrial arrhythmias.  -Severe LA dilation in 01/2022 however also in the setting of severe MR prior to repair.  -She likely will require an AAD to maintain NSR.  -Currently in atrial flutter with controlled ventricular response in the 80s to low 100s -Tx as above to see if AFL converts to NSR, currently rate controlled with low dose metoprolol 25 mg twice daily -If persistent will need to schedule cardioversion once she has recovered from pericardial tamponade and can tolerate OAC again.  -May need short course of amiodarone if she starts to have AFL/AF with RVR again and/or for DCCV as OP.  -Currently holding diltiazem due to recent shock and soft BP>> BP tolerating low-dose metoprolol -holding warfarin while pericardial drain in place   5.  HLD - continue home atorva 20 mg daily    The patient is critically ill with multiple organ systems failure and requires high complexity decision making for assessment and support, frequent evaluation and titration of therapies, application of advanced monitoring technologies and extensive interpretation of multiple databases. Critical Care Time devoted to patient care services described in this note independent of APP time is 60 minutes with >50% of time spent in direct patient care.     For questions or updates, please contact [place] Please consult [email or web address] for contact info under        Signed, Fransico Him, MD  05/11/2022, 9:17 AM

## 2022-05-12 DIAGNOSIS — I314 Cardiac tamponade: Secondary | ICD-10-CM | POA: Diagnosis not present

## 2022-05-12 LAB — LIPOPROTEIN A (LPA): Lipoprotein (a): 161.6 nmol/L — ABNORMAL HIGH (ref <75.0)

## 2022-05-12 LAB — GLUCOSE, CAPILLARY
Glucose-Capillary: 111 mg/dL — ABNORMAL HIGH (ref 70–99)
Glucose-Capillary: 106 mg/dL — ABNORMAL HIGH (ref 70–99)
Glucose-Capillary: 89 mg/dL (ref 70–99)
Glucose-Capillary: 91 mg/dL (ref 70–99)
Glucose-Capillary: 93 mg/dL (ref 70–99)

## 2022-05-12 LAB — PROTIME-INR
INR: 1.3 — ABNORMAL HIGH (ref 0.8–1.2)
Prothrombin Time: 16.3 seconds — ABNORMAL HIGH (ref 11.4–15.2)

## 2022-05-12 MED ORDER — METOPROLOL TARTRATE 25 MG PO TABS
25.0000 mg | ORAL_TABLET | Freq: Once | ORAL | Status: AC
  Administered 2022-05-12: 25 mg via ORAL
  Filled 2022-05-12: qty 1

## 2022-05-12 MED ORDER — METOPROLOL TARTRATE 50 MG PO TABS
50.0000 mg | ORAL_TABLET | Freq: Two times a day (BID) | ORAL | Status: DC
  Administered 2022-05-12 – 2022-05-17 (×10): 50 mg via ORAL
  Filled 2022-05-12 (×11): qty 1

## 2022-05-12 NOTE — Progress Notes (Signed)
Progress Note  Patient Name: [name] Date of Encounter: 05/12/2022  [facility] Cardiologist: Werner Lean, MD   Subjective   Patient presented with chest pain and dizziness status post recent MVR and TVA found to be in shock with pericardial effusion by bedside echo consistent with tamponade.  No available OR room for emergent case and patient underwent emergent pericardiocentesis of 400 cc pink/red bloody serous fluid.  Now off pressors.  Still has some drainage around the pericardial tube with 170 cc yesterday. Inpatient Medications    Scheduled Meds:  aspirin EC  81 mg Oral Daily   atorvastatin  20 mg Oral Daily   baclofen  10 mg Oral BID   buPROPion  300 mg Oral q AM   Chlorhexidine Gluconate Cloth  6 each Topical Daily   colchicine  0.6 mg Oral BID   estrogens (conjugated)  0.45 mg Oral Daily   gabapentin  600 mg Oral TID   insulin aspart  0-15 Units Subcutaneous TID WC   metoprolol tartrate  25 mg Oral BID   oxyCODONE  15 mg Oral QID   pantoprazole  40 mg Oral Daily   sodium chloride flush  3 mL Intravenous Q12H   traZODone  200 mg Oral QHS   Continuous Infusions:  sodium chloride     PRN Meds: sodium chloride, acetaminophen, albuterol, ondansetron (ZOFRAN) IV, sodium chloride flush   Vital Signs    Vitals:   05/12/22 0650 05/12/22 0700 05/12/22 0702 05/12/22 0704  BP: (!) 141/97 (!) 135/92    Pulse: (!) 138 98 (!) 139 (!) 102  Resp: 18 13    Temp: 98.4 F (36.9 C)     TempSrc: Oral     SpO2: 93% 92%    Weight:      Height:        Intake/Output Summary (Last 24 hours) at 05/12/2022 9767 Last data filed at 05/12/2022 0630 Gross per 24 hour  Intake 850 ml  Output 1645 ml  Net -795 ml       05/12/2022    6:45 AM 05/11/2022    6:30 AM 05/10/2022    6:15 AM  Last 3 Weights  Weight (lbs) 217 lb 9.5 oz 219 lb 5.7 oz 212 lb  Weight (kg) 98.7 kg 99.5 kg 96.163 kg      Telemetry    Atrial flutter with heart rate in the 90s but goes up  into the 130s at times- Personally Reviewed  ECG    No new EKG to review- Personally Reviewed  Physical Exam   GEN: Well nourished, well developed in no acute distress HEENT: Normal NECK: No JVD; No carotid bruits LYMPHATICS: No lymphadenopathy CARDIAC: Tachy and irregular, no murmurs, rubs, gallops RESPIRATORY:  Clear to auscultation without rales, wheezing or rhonchi  ABDOMEN: Soft, non-tender, non-distended MUSCULOSKELETAL:  No edema; No deformity  SKIN: Warm and dry NEUROLOGIC:  Alert and oriented x 3 PSYCHIATRIC:  Normal affect    Labs    High Sensitivity Troponin:   Recent Labs  Lab 05/02/22 1422 05/02/22 1700 05/10/22 0603  TROPONINIHS 68* 64* 33*       Chemistry Recent Labs  Lab 05/10/22 0603 05/10/22 0611 05/11/22 0206  NA 135 136 139  K 4.2 4.1 4.1  CL 107 106 114*  CO2 18*  --  19*  GLUCOSE 140* 134* 100*  BUN '21 21 16  '$ CREATININE 2.10* 2.10* 1.10*  CALCIUM 8.1*  --  7.5*  PROT 6.2*  --   --  ALBUMIN 3.2*  --   --   AST 22  --   --   ALT 15  --   --   ALKPHOS 125  --   --   BILITOT 0.6  --   --   GFRNONAA 26*  --  57*  ANIONGAP 10  --  6      Hematology Recent Labs  Lab 05/10/22 0603 05/10/22 0611  WBC 8.2  --   RBC 3.62*  --   HGB 10.3* 11.2*  HCT 32.1* 33.0*  MCV 88.7  --   MCH 28.5  --   MCHC 32.1  --   RDW 14.7  --   PLT 293  --      BNPNo results for input(s): BNP, PROBNP in the last 168 hours.   DDimer No results for input(s): DDIMER in the last 168 hours.   CHA2DS2-VASc Score = 3   This indicates a 3.2% annual risk of stroke. The patient's score is based upon: CHF History: 0 HTN History: 1 Diabetes History: 1 Stroke History: 0 Vascular Disease History: 0 Age Score: 0 Gender Score: 1  Radiology    No results found.  Cardiac Studies   2D echo 05/10/22 IMPRESSIONS    1. Right ventricle does not relax completely suggesting increase in  intrapericardial pressure.Subtle appreciation for ventricular   interdependence. Full study for tamponade physiology not performed: Tissue  doppler, MV/TV respiratory variation and IVC.  Moderate pericardial effusion. The pericardial effusion is  circumferential.   2. Left ventricular ejection fraction, by estimation, is 55 to 60%. The  left ventricle has normal function. Left ventricular diastolic function  could not be evaluated.   3. Right ventricular systolic function is normal.   4. The mitral valve is grossly normal.   5. The aortic valve was not well visualized.   Patient Profile     61 y.o. female with hypertension, diabetes, fibromyalgia, history of hep C s/p therapy, distant substance abuse, tobacco abuse, pAF and post op AF, severe MR and TR confirmed on TEE with abnormal inflammatory/rheumatic valves now s/p MVR and TV annuloplasty who presents with dizziness and chest pain.   Assessment & Plan    Cardiac tamponade/Pericardial effusion  -Patient presented to ER in shock and found to have pericardial effusion with tamponade on bedside echo -Status post recent MVR/TVA and likely redo to postop pericarditis leading to effusion -Emergent OR was not available on patient arrival in the ER and therefore patient underwent emergent pericardiocentesis of 400 cc of pink/bloody fluid serous fluid drained. Did not appear completely hemorrhagic so may just be post op pericarditis leading to pericardial effusion.  -Pericardial drain output 170 cc overnight -continue to follow drain output and DC pericardial catheter once output is minimal -Warfarin on hold until drain is out -Continue colchicine 0.6 mg twice daily  Severe MR/TR  -s/p MVR and TV annuloplasty  -Holding warfarin until drain pericardial removed.  -Ideally on warfarin 3 months post op.  -INR 2.1 on admission and Kcentra given to normalize INR for drain.  INR 1.3 today -Once recovered then resume warfarin.  -continue home ASA 81 mg daily    3.  HTN -BP stable today at 141/97  mmHg -Increase metoprolol to 50 mg twice daily (home dose was 25 twice daily) this will help with heart rate control and blood pressure control  4.  AF/AFL -Distant prior pAF and recent AFL post op.  -OP monitor on 01/21/22 without atrial arrhythmias.  -Severe LA  dilation in 01/2022 however also in the setting of severe MR prior to repair.  -She likely will require an AAD to maintain NSR.  -Currently in atrial flutter with heart rate anywhere from upper 90s to 130s at times -Tx as above to see if AFL converts to NSR-If persistent will need to schedule cardioversion once she has recovered from pericardial tamponade and can tolerate OAC again.  -May need short course of amiodarone if she starts to have AFL/AF with RVR again and/or for DCCV as OP.  -Increase metoprolol to 50 mg twice daily -She was also on Cardizem outpatient but we will titrate up beta-blocker first -If we cannot get heart rate controlled on metoprolol and Cardizem may need to add amiodarone -holding warfarin while pericardial drain in place   5.  HLD - continue home atorva 20 mg daily    The patient is critically ill with multiple organ systems failure and requires high complexity decision making for assessment and support, frequent evaluation and titration of therapies, application of advanced monitoring technologies and extensive interpretation of multiple databases. Critical Care Time devoted to patient care services described in this note independent of APP time is 60 minutes with >50% of time spent in direct patient care.     For questions or updates, please contact [place] Please consult [email or web address] for contact info under        Signed, Fransico Him, MD  05/12/2022, 9:17 AM

## 2022-05-12 NOTE — Progress Notes (Signed)
CT Surgery  Resting w/o complaint Nsr Min output from pericardial drain Making urine  Blood pressure 135/80 mm Hg pulse 84, temperature 98.5 F (36.9 C), temperature source Oral, resp. rate 19, height '5\' 11"'$  (1.803 m), weight 98.7 kg, last menstrual period 12/06/1976, SpO2 95 %.

## 2022-05-12 NOTE — Progress Notes (Signed)
2 Days Post-Op Procedure(s) (LRB): PERICARDIOCENTESIS (N/A) Subjective: Some discomfort from tube, o/w feels well  Objective: Vital signs in last 24 hours: Temp:  [98.1 F (36.7 C)-99.1 F (37.3 C)] 98.4 F (36.9 C) (06/07 0650) Pulse Rate:  [76-139] 102 (06/07 0704) Cardiac Rhythm: Atrial flutter (06/06 2000) Resp:  [9-25] 13 (06/07 0700) BP: (95-141)/(57-97) 135/92 (06/07 0700) SpO2:  [90 %-99 %] 92 % (06/07 0700) Weight:  [98.7 kg] 98.7 kg (06/07 0645)  Hemodynamic parameters for last 24 hours:    Intake/Output from previous day: 06/06 0701 - 06/07 0700 In: 850 [P.O.:840] Out: 0998 [Urine:1475; Chest Tube:170] Intake/Output this shift: No intake/output data recorded.  General appearance: alert, cooperative, and no distress Neurologic: intact Heart: regular rate and rhythm and + rub  Lab Results: Recent Labs    05/10/22 0603 05/10/22 0611  WBC 8.2  --   HGB 10.3* 11.2*  HCT 32.1* 33.0*  PLT 293  --    BMET:  Recent Labs    05/10/22 0603 05/10/22 0611 05/11/22 0206  NA 135 136 139  K 4.2 4.1 4.1  CL 107 106 114*  CO2 18*  --  19*  GLUCOSE 140* 134* 100*  BUN '21 21 16  '$ CREATININE 2.10* 2.10* 1.10*  CALCIUM 8.1*  --  7.5*    PT/INR:  Recent Labs    05/12/22 0207  LABPROT 16.3*  INR 1.3*   ABG    Component Value Date/Time   PHART 7.319 (L) 04/19/2022 2202   HCO3 20.9 04/19/2022 2202   TCO2 18 (L) 05/10/2022 0611   ACIDBASEDEF 5.0 (H) 04/19/2022 2202   O2SAT 99 04/19/2022 2202   CBG (last 3)  Recent Labs    05/11/22 1535 05/11/22 2153 05/12/22 0656  GLUCAP 87 127* 111*    Assessment/Plan: S/P Procedure(s) (LRB): PERICARDIOCENTESIS (N/A) POD # 2  pericardiocentesis Drainage decreasing- only 170 yesterday, would favor keeping tube until minimal On colchicine Ambulate    LOS: 2 days    Melrose Nakayama 05/12/2022

## 2022-05-12 NOTE — Plan of Care (Signed)
Stable overnight.   Remains in AFlutter 80-100 with slight incr during exertion. BP stable on lopressor po. Pericardial drain in place, flushed q 8 per orders, output of 170 mL/24 hr. Colchicine po per orders. INR 1.3.  Problem: Education: Goal: Understanding of CV disease, CV risk reduction, and recovery process will improve Outcome: Progressing   Problem: Activity: Goal: Ability to return to baseline activity level will improve Outcome: Progressing   Problem: Cardiovascular: Goal: Ability to achieve and maintain adequate cardiovascular perfusion will improve Outcome: Progressing   Problem: Education: Goal: Ability to describe self-care measures that may prevent or decrease complications (Diabetes Survival Skills Education) will improve Outcome: Progressing   Problem: Coping: Goal: Ability to adjust to condition or change in health will improve Outcome: Progressing   Problem: Fluid Volume: Goal: Ability to maintain a balanced intake and output will improve Outcome: Progressing   Problem: Skin Integrity: Goal: Risk for impaired skin integrity will decrease Outcome: Progressing   Problem: Education: Goal: Knowledge of General Education information will improve Description: Including pain rating scale, medication(s)/side effects and non-pharmacologic comfort measures Outcome: Progressing   Problem: Clinical Measurements: Goal: Will remain free from infection Outcome: Progressing Goal: Respiratory complications will improve Outcome: Progressing   Problem: Elimination: Goal: Will not experience complications related to bowel motility Outcome: Progressing Goal: Will not experience complications related to urinary retention Outcome: Progressing   Problem: Safety: Goal: Ability to remain free from injury will improve Outcome: Progressing

## 2022-05-13 ENCOUNTER — Inpatient Hospital Stay (HOSPITAL_COMMUNITY): Payer: Medicare Other

## 2022-05-13 DIAGNOSIS — I480 Paroxysmal atrial fibrillation: Secondary | ICD-10-CM | POA: Diagnosis present

## 2022-05-13 DIAGNOSIS — I314 Cardiac tamponade: Secondary | ICD-10-CM | POA: Diagnosis not present

## 2022-05-13 DIAGNOSIS — I3139 Other pericardial effusion (noninflammatory): Secondary | ICD-10-CM

## 2022-05-13 DIAGNOSIS — I10 Essential (primary) hypertension: Secondary | ICD-10-CM | POA: Diagnosis not present

## 2022-05-13 DIAGNOSIS — I4892 Unspecified atrial flutter: Secondary | ICD-10-CM | POA: Diagnosis not present

## 2022-05-13 LAB — CBC WITH DIFFERENTIAL/PLATELET
Abs Immature Granulocytes: 0.02 10*3/uL (ref 0.00–0.07)
Basophils Absolute: 0 10*3/uL (ref 0.0–0.1)
Basophils Relative: 1 %
Eosinophils Absolute: 0.2 10*3/uL (ref 0.0–0.5)
Eosinophils Relative: 3 %
HCT: 27.6 % — ABNORMAL LOW (ref 36.0–46.0)
Hemoglobin: 9 g/dL — ABNORMAL LOW (ref 12.0–15.0)
Immature Granulocytes: 0 %
Lymphocytes Relative: 34 %
Lymphs Abs: 2.2 10*3/uL (ref 0.7–4.0)
MCH: 28.8 pg (ref 26.0–34.0)
MCHC: 32.6 g/dL (ref 30.0–36.0)
MCV: 88.5 fL (ref 80.0–100.0)
Monocytes Absolute: 0.6 10*3/uL (ref 0.1–1.0)
Monocytes Relative: 9 %
Neutro Abs: 3.4 10*3/uL (ref 1.7–7.7)
Neutrophils Relative %: 53 %
Platelets: 204 10*3/uL (ref 150–400)
RBC: 3.12 MIL/uL — ABNORMAL LOW (ref 3.87–5.11)
RDW: 14.9 % (ref 11.5–15.5)
WBC: 6.4 10*3/uL (ref 4.0–10.5)
nRBC: 0 % (ref 0.0–0.2)

## 2022-05-13 LAB — ECHOCARDIOGRAM LIMITED
AV Mean grad: 2 mmHg
AV Peak grad: 3.9 mmHg
Ao pk vel: 0.99 m/s
Height: 71 in
Height: 71 in
MV M vel: 4.41 m/s
MV Peak grad: 77.8 mmHg
S' Lateral: 3.7 cm
Weight: 3392 oz
Weight: 3481.5 oz

## 2022-05-13 LAB — BASIC METABOLIC PANEL
Anion gap: 7 (ref 5–15)
BUN: 8 mg/dL (ref 8–23)
CO2: 19 mmol/L — ABNORMAL LOW (ref 22–32)
Calcium: 8.3 mg/dL — ABNORMAL LOW (ref 8.9–10.3)
Chloride: 113 mmol/L — ABNORMAL HIGH (ref 98–111)
Creatinine, Ser: 0.93 mg/dL (ref 0.44–1.00)
GFR, Estimated: >60 mL/min (ref >60)
Glucose, Bld: 87 mg/dL (ref 70–99)
Potassium: 4.4 mmol/L (ref 3.5–5.1)
Sodium: 139 mmol/L (ref 135–145)

## 2022-05-13 LAB — GLUCOSE, CAPILLARY
Glucose-Capillary: 116 mg/dL — ABNORMAL HIGH (ref 70–99)
Glucose-Capillary: 104 mg/dL — ABNORMAL HIGH (ref 70–99)
Glucose-Capillary: 103 mg/dL — ABNORMAL HIGH (ref 70–99)
Glucose-Capillary: 100 mg/dL — ABNORMAL HIGH (ref 70–99)

## 2022-05-13 MED ORDER — DILTIAZEM HCL ER COATED BEADS 180 MG PO CP24
180.0000 mg | ORAL_CAPSULE | Freq: Every day | ORAL | Status: DC
  Administered 2022-05-13 – 2022-05-17 (×5): 180 mg via ORAL
  Filled 2022-05-13 (×5): qty 1

## 2022-05-13 MED ORDER — OXYCODONE-ACETAMINOPHEN 5-325 MG PO TABS
1.0000 | ORAL_TABLET | Freq: Once | ORAL | Status: AC
  Administered 2022-05-13: 1 via ORAL
  Filled 2022-05-13: qty 1

## 2022-05-13 NOTE — Progress Notes (Signed)
Progress Note  Patient Name: [name] Date of Encounter: 05/13/2022  [facility] Cardiologist: Werner Lean, MD   Subjective   Patient presented with chest pain and dizziness status post recent MVR and TVA found to be in shock with pericardial effusion by bedside echo consistent with tamponade.  No available OR room for emergent case and patient underwent emergent pericardiocentesis of 400 cc pink/red bloody serous fluid.  Now off pressors.  Pericardial drain 80 cc yesterday  Inpatient Medications    Scheduled Meds:  aspirin EC  81 mg Oral Daily   atorvastatin  20 mg Oral Daily   baclofen  10 mg Oral BID   buPROPion  300 mg Oral q AM   Chlorhexidine Gluconate Cloth  6 each Topical Daily   colchicine  0.6 mg Oral BID   estrogens (conjugated)  0.45 mg Oral Daily   gabapentin  600 mg Oral TID   insulin aspart  0-15 Units Subcutaneous TID WC   metoprolol tartrate  50 mg Oral BID   oxyCODONE  15 mg Oral QID   pantoprazole  40 mg Oral Daily   sodium chloride flush  3 mL Intravenous Q12H   traZODone  200 mg Oral QHS   Continuous Infusions:  sodium chloride     PRN Meds: sodium chloride, acetaminophen, albuterol, ondansetron (ZOFRAN) IV, sodium chloride flush   Vital Signs    Vitals:   05/13/22 0500 05/13/22 0605 05/13/22 0700 05/13/22 0800  BP: (!) 151/114 (!) 156/90 (!) 150/111 (!) 142/126  Pulse: 92 (!) 104 (!) 137 91  Resp: '13 13 17 20  '$ Temp:   98.4 F (36.9 C)   TempSrc:   Oral   SpO2: 96% 94% 94% 94%  Weight:      Height:        Intake/Output Summary (Last 24 hours) at 05/13/2022 0851 Last data filed at 05/13/2022 0800 Gross per 24 hour  Intake 855 ml  Output 1145 ml  Net -290 ml       05/12/2022    6:45 AM 05/11/2022    6:30 AM 05/10/2022    6:15 AM  Last 3 Weights  Weight (lbs) 217 lb 9.5 oz 219 lb 5.7 oz 212 lb  Weight (kg) 98.7 kg 99.5 kg 96.163 kg      Telemetry    Atrial flutter with heart rates in the 90s To 130s at times  personally Reviewed  ECG    No new EKG to review- Personally Reviewed  Physical Exam   GEN: Well nourished, well developed in no acute distress HEENT: Normal NECK: No JVD; No carotid bruits LYMPHATICS: No lymphadenopathy CARDIAC: Irregular and tachycardic at times, no murmurs, rubs, gallops RESPIRATORY:  Clear to auscultation without rales, wheezing or rhonchi  ABDOMEN: Soft, non-tender, non-distended MUSCULOSKELETAL:  No edema; No deformity  SKIN: Warm and dry NEUROLOGIC:  Alert and oriented x 3 PSYCHIATRIC:  Normal affect   Labs    High Sensitivity Troponin:   Recent Labs  Lab 05/02/22 1422 05/02/22 1700 05/10/22 0603  TROPONINIHS 68* 64* 33*       Chemistry Recent Labs  Lab 05/10/22 0603 05/10/22 0611 05/11/22 0206 05/13/22 0103  NA 135 136 139 139  K 4.2 4.1 4.1 4.4  CL 107 106 114* 113*  CO2 18*  --  19* 19*  GLUCOSE 140* 134* 100* 87  BUN '21 21 16 8  '$ CREATININE 2.10* 2.10* 1.10* 0.93  CALCIUM 8.1*  --  7.5* 8.3*  PROT 6.2*  --   --   --  ALBUMIN 3.2*  --   --   --   AST 22  --   --   --   ALT 15  --   --   --   ALKPHOS 125  --   --   --   BILITOT 0.6  --   --   --   GFRNONAA 26*  --  57* >60  ANIONGAP 10  --  6 7      Hematology Recent Labs  Lab 05/10/22 0603 05/10/22 0611 05/13/22 0103  WBC 8.2  --  6.4  RBC 3.62*  --  3.12*  HGB 10.3* 11.2* 9.0*  HCT 32.1* 33.0* 27.6*  MCV 88.7  --  88.5  MCH 28.5  --  28.8  MCHC 32.1  --  32.6  RDW 14.7  --  14.9  PLT 293  --  204     BNPNo results for input(s): "BNP", "PROBNP" in the last 168 hours.   DDimer No results for input(s): "DDIMER" in the last 168 hours.   CHA2DS2-VASc Score = 3   This indicates a 3.2% annual risk of stroke. The patient's score is based upon: CHF History: 0 HTN History: 1 Diabetes History: 1 Stroke History: 0 Vascular Disease History: 0 Age Score: 0 Gender Score: 1  Radiology    No results found.  Cardiac Studies   2D echo 05/10/22 IMPRESSIONS    1.  Right ventricle does not relax completely suggesting increase in  intrapericardial pressure.Subtle appreciation for ventricular  interdependence. Full study for tamponade physiology not performed: Tissue  doppler, MV/TV respiratory variation and IVC.  Moderate pericardial effusion. The pericardial effusion is  circumferential.   2. Left ventricular ejection fraction, by estimation, is 55 to 60%. The  left ventricle has normal function. Left ventricular diastolic function  could not be evaluated.   3. Right ventricular systolic function is normal.   4. The mitral valve is grossly normal.   5. The aortic valve was not well visualized.   Patient Profile     61 y.o. female with hypertension, diabetes, fibromyalgia, history of hep C s/p therapy, distant substance abuse, tobacco abuse, pAF and post op AF, severe MR and TR confirmed on TEE with abnormal inflammatory/rheumatic valves now s/p MVR and TV annuloplasty who presents with dizziness and chest pain.   Assessment & Plan    Cardiac tamponade/Pericardial effusion  -Patient presented to ER in shock and found to have pericardial effusion with tamponade on bedside echo -Status post recent MVR/TVA and likely redo to postop pericarditis leading to effusion -Emergent OR was not available on patient arrival in the ER and therefore patient underwent emergent pericardiocentesis of 400 cc of pink/bloody fluid serous fluid drained. Did not appear completely hemorrhagic so may just be post op pericarditis leading to pericardial effusion.  -Pericardial drain output 80 cc overnight -We will check limited echo today to reassess any residual pericardial fluid and if it is normal then will pull pericardial drain -Warfarin on hold until drain is out -Continue colchicine 0.6 mg twice daily  Severe MR/TR  -s/p MVR and TV annuloplasty  -Holding warfarin until drain pericardial removed.  -Ideally on warfarin 3 months post op.  -INR 2.1 on admission and  Kcentra given to normalize INR for drain.  INR 1.3 today -Once recovered then resume warfarin.  -continue home ASA 81 mg daily    3.  HTN -BP elevated today at 142/126 mmHg and persistently hypertensive through the night -Continue metoprolol 50 mg  twice daily -Restart Cardizem CD 180 mg daily (was on 120 mg at home )  4.  AF/AFL -Distant prior pAF and recent AFL post op.  -OP monitor on 01/21/22 without atrial arrhythmias.  -Severe LA dilation in 01/2022 however also in the setting of severe MR prior to repair.  -She likely will require an AAD to maintain NSR.  -Currently in atrial flutter with heart rate anywhere from upper 90s to 130s at times -Tx as above to see if AFL converts to NSR-If persistent will need to schedule cardioversion once she has recovered from pericardial tamponade and can tolerate OAC again.  -May need short course of amiodarone if she starts to have AFL/AF with RVR again and/or for DCCV as OP.  -Continue metoprolol 50 mg twice daily -She was also on Cardizem outpatient at 120 mg daily we will restart Cardizem but increased to 180 mg daily for better blood pressure and heart rate control -If we cannot get heart rate controlled on metoprolol and Cardizem may need to add amiodarone -holding warfarin while pericardial drain in place   5.  HLD - continue home atorva 20 mg daily    The patient is critically ill with multiple organ systems failure and requires high complexity decision making for assessment and support, frequent evaluation and titration of therapies, application of advanced monitoring technologies and extensive interpretation of multiple databases. Critical Care Time devoted to patient care services described in this note independent of APP time is 60 minutes with >50% of time spent in direct patient care.     For questions or updates, please contact [place] Please consult [email or web address] for contact info under        Signed, Fransico Him, MD   05/13/2022, 8:51 AM

## 2022-05-13 NOTE — Progress Notes (Signed)
3 Days Post-Op Procedure(s) (LRB): PERICARDIOCENTESIS (N/A) Subjective: Still  SOB with activity   Objective: Vital signs in last 24 hours: Temp:  [98.1 F (36.7 C)-98.7 F (37.1 C)] 98.6 F (37 C) (06/08 1113) Pulse Rate:  [83-137] 89 (06/08 1200) Cardiac Rhythm: Atrial flutter (06/08 1200) Resp:  [12-28] 16 (06/08 1200) BP: (122-156)/(77-126) 154/105 (06/08 1200) SpO2:  [92 %-98 %] 96 % (06/08 1200)  Hemodynamic parameters for last 24 hours:    Intake/Output from previous day: 06/07 0701 - 06/08 0700 In: 610 [P.O.:600] Out: 980 [Urine:900; Chest Tube:80] Intake/Output this shift: Total I/O In: 485 [P.O.:480] Out: 165 [Urine:150; Chest Tube:15]  General appearance: alert, cooperative, and no distress Heart: tachy, regular Wound: clean and dry  Lab Results: Recent Labs    05/13/22 0103  WBC 6.4  HGB 9.0*  HCT 27.6*  PLT 204   BMET:  Recent Labs    05/11/22 0206 05/13/22 0103  NA 139 139  K 4.1 4.4  CL 114* 113*  CO2 19* 19*  GLUCOSE 100* 87  BUN 16 8  CREATININE 1.10* 0.93  CALCIUM 7.5* 8.3*    PT/INR:  Recent Labs    05/12/22 0207  LABPROT 16.3*  INR 1.3*   ABG    Component Value Date/Time   PHART 7.319 (L) 04/19/2022 2202   HCO3 20.9 04/19/2022 2202   TCO2 18 (L) 05/10/2022 0611   ACIDBASEDEF 5.0 (H) 04/19/2022 2202   O2SAT 99 04/19/2022 2202   CBG (last 3)  Recent Labs    05/12/22 2217 05/13/22 0558 05/13/22 1112  GLUCAP 93 116* 104*    Assessment/Plan: S/P Procedure(s) (LRB): PERICARDIOCENTESIS (N/A) Remains in atrial flutter, rate in 110s currently Drain output down to 80 ml Echo done but not read yet   LOS: 3 days    [name] 05/13/2022

## 2022-05-14 DIAGNOSIS — I480 Paroxysmal atrial fibrillation: Secondary | ICD-10-CM | POA: Diagnosis not present

## 2022-05-14 DIAGNOSIS — I314 Cardiac tamponade: Secondary | ICD-10-CM | POA: Diagnosis not present

## 2022-05-14 DIAGNOSIS — I4892 Unspecified atrial flutter: Secondary | ICD-10-CM | POA: Diagnosis not present

## 2022-05-14 LAB — GLUCOSE, CAPILLARY
Glucose-Capillary: 97 mg/dL (ref 70–99)
Glucose-Capillary: 132 mg/dL — ABNORMAL HIGH (ref 70–99)
Glucose-Capillary: 82 mg/dL (ref 70–99)
Glucose-Capillary: 120 mg/dL — ABNORMAL HIGH (ref 70–99)

## 2022-05-14 NOTE — Progress Notes (Addendum)
Progress Note  Patient Name: [name] Date of Encounter: 05/14/2022  [facility] Cardiologist: Werner Lean, MD   Subjective   Patient presented with chest pain and dizziness status post recent MVR and TVA found to be in shock with pericardial effusion by bedside echo consistent with tamponade.  No available OR room for emergent case and patient underwent emergent pericardiocentesis of 400 cc pink/red bloody serous fluid.  Now off pressors.  Pericardial drain 125cc yesterday.  Inpatient Medications    Scheduled Meds:  aspirin EC  81 mg Oral Daily   atorvastatin  20 mg Oral Daily   baclofen  10 mg Oral BID   buPROPion  300 mg Oral q AM   Chlorhexidine Gluconate Cloth  6 each Topical Daily   colchicine  0.6 mg Oral BID   diltiazem  180 mg Oral Daily   estrogens (conjugated)  0.45 mg Oral Daily   gabapentin  600 mg Oral TID   insulin aspart  0-15 Units Subcutaneous TID WC   metoprolol tartrate  50 mg Oral BID   oxyCODONE  15 mg Oral QID   pantoprazole  40 mg Oral Daily   sodium chloride flush  3 mL Intravenous Q12H   traZODone  200 mg Oral QHS   Continuous Infusions:  sodium chloride     PRN Meds: sodium chloride, acetaminophen, albuterol, ondansetron (ZOFRAN) IV, sodium chloride flush   Vital Signs    Vitals:   05/14/22 0600 05/14/22 0625 05/14/22 0700 05/14/22 0800  BP: 117/71  123/78 119/77  Pulse: 87  89 86  Resp: (!) 23  20 (!) 24  Temp:  98.9 F (37.2 C)    TempSrc:  Oral    SpO2: 99%  93% 96%  Weight:      Height:        Intake/Output Summary (Last 24 hours) at 05/14/2022 0840 Last data filed at 05/14/2022 0160 Gross per 24 hour  Intake 725 ml  Output 960 ml  Net -235 ml       05/12/2022    6:45 AM 05/11/2022    6:30 AM 05/10/2022    6:15 AM  Last 3 Weights  Weight (lbs) 217 lb 9.5 oz 219 lb 5.7 oz 212 lb  Weight (kg) 98.7 kg 99.5 kg 96.163 kg      Telemetry    Atrial flutter with heart rates in the 90s To 130s at times  personally Reviewed  ECG    No new EKG to review- Personally Reviewed  Physical Exam   GEN: Well nourished, well developed in no acute distress HEENT: Normal NECK: No JVD; No carotid bruits LYMPHATICS: No lymphadenopathy CARDIAC: Irregular and tachycardic at times, no murmurs, rubs, gallops RESPIRATORY:  Clear to auscultation without rales, wheezing or rhonchi  ABDOMEN: Soft, non-tender, non-distended MUSCULOSKELETAL:  No edema; No deformity  SKIN: Warm and dry NEUROLOGIC:  Alert and oriented x 3 PSYCHIATRIC:  Normal affect   Labs    High Sensitivity Troponin:   Recent Labs  Lab 05/02/22 1422 05/02/22 1700 05/10/22 0603  TROPONINIHS 68* 64* 33*       Chemistry Recent Labs  Lab 05/10/22 0603 05/10/22 0611 05/11/22 0206 05/13/22 0103  NA 135 136 139 139  K 4.2 4.1 4.1 4.4  CL 107 106 114* 113*  CO2 18*  --  19* 19*  GLUCOSE 140* 134* 100* 87  BUN '21 21 16 8  '$ CREATININE 2.10* 2.10* 1.10* 0.93  CALCIUM 8.1*  --  7.5* 8.3*  PROT 6.2*  --   --   --   ALBUMIN 3.2*  --   --   --   AST 22  --   --   --   ALT 15  --   --   --   ALKPHOS 125  --   --   --   BILITOT 0.6  --   --   --   GFRNONAA 26*  --  57* >60  ANIONGAP 10  --  6 7      Hematology Recent Labs  Lab 05/10/22 0603 05/10/22 0611 05/13/22 0103  WBC 8.2  --  6.4  RBC 3.62*  --  3.12*  HGB 10.3* 11.2* 9.0*  HCT 32.1* 33.0* 27.6*  MCV 88.7  --  88.5  MCH 28.5  --  28.8  MCHC 32.1  --  32.6  RDW 14.7  --  14.9  PLT 293  --  204     BNPNo results for input(s): "BNP", "PROBNP" in the last 168 hours.   DDimer No results for input(s): "DDIMER" in the last 168 hours.   CHA2DS2-VASc Score = 3   This indicates a 3.2% annual risk of stroke. The patient's score is based upon: CHF History: 0 HTN History: 1 Diabetes History: 1 Stroke History: 0 Vascular Disease History: 0 Age Score: 0 Gender Score: 1  Radiology    ECHOCARDIOGRAM LIMITED  Result Date: 05/13/2022    ECHOCARDIOGRAM LIMITED  REPORT   Patient Name:   [name] Date of Exam: 05/13/2022 Medical Rec #:  [id number]         Height:       71.0 in Accession #:    [id number]        Weight:       217.6 lb Date of Birth:  1961/01/11         BSA:          2.185 m Patient Age:    61 years          BP:           147/128 mmHg Patient Gender: F                 HR:           98 bpm. Exam Location:  Inpatient Procedure: Limited Echo, Color Doppler and Limited Color Doppler Indications:    Pericardial effusion  History:        Patient has prior history of Echocardiogram examinations, most                 recent 04/23/2022. Signs/Symptoms:Murmur; Risk Factors:Diabetes                 and Hypertension. 05/10/22 pericardiocentesis                 MV and TV repair 04/19/22.                  Mitral Valve: 30 mm prosthetic annuloplasty ring valve is                 present in the mitral position. Procedure Date: 04/27/22.  Sonographer:    Luisa Hart RDCS Referring Phys: [place]  1. Moderate septal hypertrophy with otherwise mild LVH. Basal to mid inferior and inferoseptal hypokinesis. Left ventricular ejection fraction, by estimation, is 45 to 50%. The left ventricle has mildly decreased function. The left ventricle demonstrates regional wall motion abnormalities (see scoring  diagram/findings for description). There is moderate left ventricular hypertrophy.  2. Right ventricular systolic function is normal. The right ventricular size is normal. There is mildly elevated pulmonary artery systolic pressure.  3. Left atrial size was moderately dilated.  4. The mitral valve has been repaired/replaced. Trivial mitral valve regurgitation. No evidence of mitral stenosis. There is a 30 mm prosthetic annuloplasty ring present in the mitral position. Procedure Date: 04/27/22.  5. Pericardial effusion has resolved.  6. The tricuspid valve is has been repaired/replaced. 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 Tricuspid  valve regurgitation is mild.  7. The aortic valve is normal in structure. Aortic valve regurgitation is not visualized. No aortic stenosis is present.  8. The inferior vena cava is normal in size with greater than 50% respiratory variability, suggesting right atrial pressure of 3 mmHg. FINDINGS  Left Ventricle: Moderate septal hypertrophy with otherwise mild LVH. Basal to mid inferior and inferoseptal hypokinesis. Left ventricular ejection fraction, by estimation, is 45 to 50%. The left ventricle has mildly decreased function. The left ventricle demonstrates regional wall motion abnormalities. The left ventricular internal cavity size was normal in size. There is moderate left ventricular hypertrophy. Right Ventricle: The right ventricular size is normal. No increase in right ventricular wall thickness. Right ventricular systolic function is normal. There is mildly elevated pulmonary artery systolic pressure. The tricuspid regurgitant velocity is 2.73  m/s, and with an assumed right atrial pressure of 15 mmHg, the estimated right ventricular systolic pressure is 16.0 mmHg. Left Atrium: Left atrial size was moderately dilated. Right Atrium: Right atrial size was normal in size. Pericardium: Pericardial effusion has resolved. There is no evidence of pericardial effusion. There is excessive respiratory variation in the tricuspid valve spectral Doppler velocities. Mitral Valve: The mitral valve has been repaired/replaced. Trivial mitral valve regurgitation. There is a 30 mm prosthetic annuloplasty ring present in the mitral position. Procedure Date: 04/27/22. No evidence of mitral valve stenosis. The mean mitral valve gradient is 6.4 mmHg with average heart rate of 76 bpm. Tricuspid Valve: The tricuspid valve is has been repaired/replaced. Tricuspid valve regurgitation is mild . No evidence of tricuspid stenosis. 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 Aortic Valve: The aortic valve is normal  in structure. Aortic valve regurgitation is not visualized. No aortic stenosis is present. Aortic valve mean gradient measures 2.0 mmHg. Aortic valve peak gradient measures 3.9 mmHg. Pulmonic Valve: The pulmonic valve was normal in structure. Pulmonic valve regurgitation is not visualized. No evidence of pulmonic stenosis. Aorta: The aortic root is normal in size and structure. Venous: The inferior vena cava is normal in size with greater than 50% respiratory variability, suggesting right atrial pressure of 3 mmHg. IAS/Shunts: No atrial level shunt detected by color flow Doppler. LEFT VENTRICLE PLAX 2D LVIDd:         4.70 cm LVIDs:         3.70 cm LV PW:         1.10 cm LV IVS:        1.50 cm  LEFT ATRIUM           Index LA Vol (A4C): 75.2 ml 34.41 ml/m  AORTIC VALVE                   PULMONIC VALVE AV Vmax:           98.65 cm/s  PV Vmax:       0.72 m/s AV Vmean:  66.650 cm/s PV Vmean:      46.450 cm/s AV VTI:            0.150 m     PV VTI:        0.118 m AV Peak Grad:      3.9 mmHg    PV Peak grad:  2.1 mmHg AV Mean Grad:      2.0 mmHg    PV Mean grad:  1.0 mmHg LVOT Vmax:         65.80 cm/s LVOT Vmean:        40.100 cm/s LVOT VTI:          0.107 m LVOT/AV VTI ratio: 0.71  AORTA Ao Asc diam: 3.50 cm MITRAL VALVE                TRICUSPID VALVE MV Mean grad: 6.4 mmHg      TR Peak grad:   29.8 mmHg MR Peak grad: 77.8 mmHg     TR Vmax:        273.00 cm/s MR Mean grad: 57.0 mmHg MR Vmax:      441.00 cm/s   SHUNTS MR Vmean:     368.0 cm/s    Systemic VTI: 0.11 m MV E velocity: 190.40 cm/s Skeet Latch MD Electronically signed by Skeet Latch MD Signature Date/Time: 05/13/2022/3:03:20 PM    Final     Cardiac Studies  2D echo 05/13/2022 IMPRESSIONS    1. Moderate septal hypertrophy with otherwise mild LVH. Basal to mid  inferior and inferoseptal hypokinesis. Left ventricular ejection fraction,  by estimation, is 45 to 50%. The left ventricle has mildly decreased  function. The left ventricle   demonstrates regional wall motion abnormalities (see scoring  diagram/findings for description). There is moderate left ventricular  hypertrophy.   2. Right ventricular systolic function is normal. The right ventricular  size is normal. There is mildly elevated pulmonary artery systolic  pressure.   3. Left atrial size was moderately dilated.   4. The mitral valve has been repaired/replaced. Trivial mitral valve  regurgitation. No evidence of mitral stenosis. There is a 30 mm prosthetic  annuloplasty ring present in the mitral position. Procedure Date: 04/27/22.   5. Pericardial effusion has resolved.   6. The tricuspid valve is has been repaired/replaced. The tricuspid valve  is status post repair with an annuloplasty ring. Tricuspid valve  regurgitation is mild.   7. The aortic valve is normal in structure. Aortic valve regurgitation is  not visualized. No aortic stenosis is present.   8. The inferior vena cava is normal in size with greater than 50%  respiratory variability, suggesting right atrial pressure of 3 mmHg.    2D echo 05/10/22 IMPRESSIONS    1. Right ventricle does not relax completely suggesting increase in  intrapericardial pressure.Subtle appreciation for ventricular  interdependence. Full study for tamponade physiology not performed: Tissue  doppler, MV/TV respiratory variation and IVC.  Moderate pericardial effusion. The pericardial effusion is  circumferential.   2. Left ventricular ejection fraction, by estimation, is 55 to 60%. The  left ventricle has normal function. Left ventricular diastolic function  could not be evaluated.   3. Right ventricular systolic function is normal.   4. The mitral valve is grossly normal.   5. The aortic valve was not well visualized.   Patient Profile     61 y.o. female with hypertension, diabetes, fibromyalgia, history of hep C s/p therapy, distant substance abuse, tobacco abuse, pAF and post op  AF, severe MR and TR confirmed  on TEE with abnormal inflammatory/rheumatic valves now s/p MVR and TV annuloplasty who presents with dizziness and chest pain.   Assessment & Plan    Cardiac tamponade/Pericardial effusion  -Patient presented to ER in shock and found to have pericardial effusion with tamponade on bedside echo -Status post recent MVR/TVA and likely redo to postop pericarditis leading to effusion -Emergent OR was not available on patient arrival in the ER and therefore patient underwent emergent pericardiocentesis of 400 cc of pink/bloody fluid serous fluid drained. Did not appear completely hemorrhagic so may just be post op pericarditis leading to pericardial effusion.  -Pericardial drain output 125 cc today -2D echo yesterday showed resolution of pericardial effusion -will keep drain in another day and reassess tomorrow (want drainage < 25cc/day prior to removing) -Warfarin on hold until drain is out -Continue colchicine 0.6 mg twice daily  Severe MR/TR  -s/p MVR and TV annuloplasty  -Holding warfarin until drain pericardial removed.  -Ideally on warfarin 3 months post op.  -INR 2.1 on admission and Kcentra given to normalize INR for drain.  INR 1.3 today -Once recovered then resume warfarin.  -continue home ASA 81 mg daily    3.  HTN -BP much improved after restarting Cardizem at 119/77 mmHg today -Continue metoprolol 50 mg twice daily and Cardizem CD 180 mg daily   4.  AF/AFL -Distant prior pAF and recent AFL post op.  -OP monitor on 01/21/22 without atrial arrhythmias.  -Severe LA dilation in 01/2022 however also in the setting of severe MR prior to repair.  -She likely will require an AAD to maintain NSR.  -Currently in atrial flutter with heart rate anywhere from upper 90s to 130s at times -Tx as above to see if AFL converts to NSR-If persistent will need to schedule cardioversion once she has recovered from pericardial tamponade and can tolerate OAC again.  -May need short course of amiodarone  if she starts to have AFL/AF with RVR again and/or for DCCV as OP.  -Heart rate improved and mainly in the 90s but does at times go up into the 130s -Continue metoprolol 50 mg twice daily and Cardizem CD 180 mg daily -If she continues to spike heart rates may need to consider addition of amiodarone low-dose for heart rate control -holding warfarin while pericardial drain in place   5.  HLD - continue home atorva 20 mg daily    I have spent a total of 35 minutes with patient reviewing 2D echo , when he came down here and they were in the process of codingmetry, EKGs, labs and examining patient as well as establishing an assessment and plan that was discussed with the patient.  > 50% of time was spent in direct patient care.     For questions or updates, please contact [place] Please consult [email or web address] for contact info under        Signed, Fransico Him, MD  05/14/2022, 8:40 AM

## 2022-05-14 NOTE — Plan of Care (Signed)

## 2022-05-14 NOTE — Progress Notes (Signed)
4 Days Post-Op Procedure(s) (LRB): PERICARDIOCENTESIS (N/A) Subjective: Feels better this afternoon  Objective: Vital signs in last 24 hours: Temp:  [97.8 F (36.6 C)-98.9 F (37.2 C)] 98.1 F (36.7 C) (06/09 1100) Pulse Rate:  [74-110] 74 (06/09 1100) Cardiac Rhythm: Atrial flutter (06/09 1100) Resp:  [11-31] 19 (06/09 1100) BP: (94-165)/(71-110) 95/80 (06/09 1100) SpO2:  [86 %-99 %] 99 % (06/09 1100)  Hemodynamic parameters for last 24 hours:    Intake/Output from previous day: 06/08 0701 - 06/09 0700 In: 845 [P.O.:840] Out: 1125 [Urine:1000; Chest Tube:125] Intake/Output this shift: Total I/O In: 125 [P.O.:120] Out: -   General appearance: alert, cooperative, and no distress Neurologic: intact Heart: regular rate and rhythm Lungs: clear to auscultation bilaterally  Lab Results: Recent Labs    05/13/22 0103  WBC 6.4  HGB 9.0*  HCT 27.6*  PLT 204   BMET:  Recent Labs    05/13/22 0103  NA 139  K 4.4  CL 113*  CO2 19*  GLUCOSE 87  BUN 8  CREATININE 0.93  CALCIUM 8.3*    PT/INR:  Recent Labs    05/12/22 0207  LABPROT 16.3*  INR 1.3*   ABG    Component Value Date/Time   PHART 7.319 (L) 04/19/2022 2202   HCO3 20.9 04/19/2022 2202   TCO2 18 (L) 05/10/2022 0611   ACIDBASEDEF 5.0 (H) 04/19/2022 2202   O2SAT 99 04/19/2022 2202   CBG (last 3)  Recent Labs    05/13/22 2201 05/14/22 0625 05/14/22 1121  GLUCAP 100* 97 132*    Assessment/Plan: S/P Procedure(s) (LRB): PERICARDIOCENTESIS (N/A) - About 120 ml from drain yesterday Minimal fluid in canister currently Feels better with better rate control Echo showed no residual effusion, valves look good Ok from my standpoint to dc pericardial drain whenever Cardiology feels it is appropriate   LOS: 4 days    Melrose Nakayama 05/14/2022

## 2022-05-15 ENCOUNTER — Inpatient Hospital Stay (HOSPITAL_COMMUNITY): Payer: Medicare Other

## 2022-05-15 DIAGNOSIS — I314 Cardiac tamponade: Secondary | ICD-10-CM | POA: Diagnosis not present

## 2022-05-15 LAB — PROTIME-INR
INR: 1.2 (ref 0.8–1.2)
Prothrombin Time: 14.8 seconds (ref 11.4–15.2)

## 2022-05-15 LAB — CBC
HCT: 29.5 % — ABNORMAL LOW (ref 36.0–46.0)
Hemoglobin: 9.3 g/dL — ABNORMAL LOW (ref 12.0–15.0)
MCH: 28.4 pg (ref 26.0–34.0)
MCHC: 31.5 g/dL (ref 30.0–36.0)
MCV: 89.9 fL (ref 80.0–100.0)
Platelets: 214 10*3/uL (ref 150–400)
RBC: 3.28 MIL/uL — ABNORMAL LOW (ref 3.87–5.11)
RDW: 14.8 % (ref 11.5–15.5)
WBC: 5.8 10*3/uL (ref 4.0–10.5)
nRBC: 0 % (ref 0.0–0.2)

## 2022-05-15 LAB — CULTURE, BODY FLUID W GRAM STAIN -BOTTLE: Culture: NO GROWTH

## 2022-05-15 LAB — GLUCOSE, CAPILLARY
Glucose-Capillary: 111 mg/dL — ABNORMAL HIGH (ref 70–99)
Glucose-Capillary: 108 mg/dL — ABNORMAL HIGH (ref 70–99)
Glucose-Capillary: 95 mg/dL (ref 70–99)
Glucose-Capillary: 111 mg/dL — ABNORMAL HIGH (ref 70–99)

## 2022-05-15 MED ORDER — WARFARIN SODIUM 5 MG PO TABS
5.0000 mg | ORAL_TABLET | Freq: Once | ORAL | Status: AC
Start: 2022-05-16 — End: 2022-05-16
  Administered 2022-05-16: 5 mg via ORAL
  Filled 2022-05-15: qty 1

## 2022-05-15 MED ORDER — WARFARIN - PHARMACIST DOSING INPATIENT: Freq: Every day | Status: DC

## 2022-05-15 NOTE — Progress Notes (Signed)
ANTICOAGULATION CONSULT NOTE - Initial Consult  Pharmacy Consult for warfarin Indication: atrial fibrillation  Allergies  Allergen Reactions   Hydrocodone-Acetaminophen Itching   Hydrocodone Itching    Tolerates oxycodone     Patient Measurements: Height: '5\' 11"'$  (180.3 cm) Weight: 98.7 kg (217 lb 9.5 oz) IBW/kg (Calculated) : 70.8   Vital Signs: Temp: 98.1 F (36.7 C) (06/10 1200) Temp Source: Oral (06/10 1200) BP: 117/78 (06/10 1211) Pulse Rate: 94 (06/10 1211)  Labs: Recent Labs    05/13/22 0103  HGB 9.0*  HCT 27.6*  PLT 204  CREATININE 0.93    Estimated Creatinine Clearance: 81.2 mL/min (by C-G formula based on SCr of 0.93 mg/dL).   Medical History: Past Medical History:  Diagnosis Date   Arthritis    Cervicalgia    Chondromalacia of patella    Chronic neck pain    Chronic pain syndrome    Depression    secondary to loss of her son at age 18   Diabetes mellitus    Diabetic neuropathy ([place])    DMII (diabetes mellitus, type 2) ([place])    Enthesopathy of hip region    Fibromyalgia    Hep C w/o coma, chronic ([place])    Hepatitis C    diagnosed 2005   Hypertension    Insomnia    Insomnia    Low back pain    Lumbago    Mitral regurgitation    Obesity    Pain in joint, upper arm    Primary localized osteoarthrosis, lower leg    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   Tobacco abuse    Tricuspid regurgitation    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    Medications:  Scheduled:   aspirin EC  81 mg Oral Daily   atorvastatin  20 mg Oral Daily   baclofen  10 mg Oral BID   buPROPion  300 mg Oral q AM   Chlorhexidine Gluconate Cloth  6 each Topical Daily   colchicine  0.6 mg Oral BID   diltiazem  180 mg Oral Daily   estrogens (conjugated)  0.45 mg Oral Daily   gabapentin  600 mg Oral TID   insulin aspart  0-15 Units Subcutaneous TID WC   metoprolol tartrate  50 mg Oral BID   oxyCODONE  15 mg Oral QID   pantoprazole  40 mg Oral Daily    sodium chloride flush  3 mL Intravenous Q12H   traZODone  200 mg Oral QHS    Assessment: 61 yo F s/p recent MVR and TVR on 5/15 which was c/b postop Afib/flutter.  She was sent home on 5 mg TTS, 2 mg AOD and INR was 2.5 at discharge.  Patient returned to ED 4 days later with CP.  Cardiac work up unremarkable.  INR was 1.8, no changes made.  Patient presented 6/5 with significant CP and found to have pericardial effusion c/w tamponade on bedside Echo.  INR was 2.1 and warfarin reversed with Kcentra/VitK.  She was then emergently taken for pericardiocentesis with 400cc pink/red bloody serous fluid removed.  Pharmacy consulted for warfarin dosing s/p pericardial drain removal 6/10.   PTA warfarin regimen = 5 mg TTS; 2 mg AOD  INR 1.2 today.  CBC stable.  Goal of Therapy:  INR 2-3 Monitor platelets by anticoagulation protocol: Yes   Plan:  Give warfarin 5 mg today Daily INR, CBC  Laurey Arrow, PharmD PGY1 Pharmacy Resident 05/15/2022  2:06 PM  Please check  [email or web address] for unit-specific pharmacy phone numbers.

## 2022-05-15 NOTE — Progress Notes (Signed)
5 Days Post-Op Procedure(s) (LRB): PERICARDIOCENTESIS (N/A) Subjective: Some incisional pain this AM  Objective: Vital signs in last 24 hours: Temp:  [97.8 F (36.6 C)-98.2 F (36.8 C)] 98.2 F (36.8 C) (06/10 0737) Pulse Rate:  [77-138] 85 (06/10 0800) Cardiac Rhythm: Atrial flutter (06/10 0800) Resp:  [11-32] 17 (06/10 0800) BP: (82-134)/(56-91) 105/70 (06/10 0800) SpO2:  [90 %-100 %] 99 % (06/10 0800)  Hemodynamic parameters for last 24 hours:    Intake/Output from previous day: 06/09 0701 - 06/10 0700 In: 630 [P.O.:620] Out: 395 [Urine:350; Chest Tube:45] Intake/Output this shift: No intake/output data recorded.  General appearance: alert, cooperative, and no distress Neurologic: intact Heart: regular rate and rhythm and Flutter  Lab Results: Recent Labs    05/13/22 0103  WBC 6.4  HGB 9.0*  HCT 27.6*  PLT 204   BMET:  Recent Labs    05/13/22 0103  NA 139  K 4.4  CL 113*  CO2 19*  GLUCOSE 87  BUN 8  CREATININE 0.93  CALCIUM 8.3*    PT/INR: No results for input(s): "LABPROT", "INR" in the last 72 hours. ABG    Component Value Date/Time   PHART 7.319 (L) 04/19/2022 2202   HCO3 20.9 04/19/2022 2202   TCO2 18 (L) 05/10/2022 0611   ACIDBASEDEF 5.0 (H) 04/19/2022 2202   O2SAT 99 04/19/2022 2202   CBG (last 3)  Recent Labs    05/14/22 1607 05/14/22 2146 05/15/22 0734  GLUCAP 82 120* 111*    Assessment/Plan: S/P Procedure(s) (LRB): PERICARDIOCENTESIS (N/A) Looks good Hopefully will get drain out today Likely can transfer to tele   LOS: 5 days    [name] 05/15/2022

## 2022-05-15 NOTE — Progress Notes (Addendum)
Progress Note  Patient Name: [name] Date of Encounter: 05/15/2022  [facility] Cardiologist: Werner Lean, MD   Subjective   Patient presented with chest pain and dizziness status post recent MVR and TVA found to be in shock with pericardial effusion by bedside echo consistent with tamponade.  No available OR room for emergent case and patient underwent emergent pericardiocentesis of 400 cc pink/red bloody serous fluid.  Now off pressors.  Pericardial drain 125cc 6/8, 30 mL overnight 6/9. OK to pull drain today.  Inpatient Medications    Scheduled Meds:  aspirin EC  81 mg Oral Daily   atorvastatin  20 mg Oral Daily   baclofen  10 mg Oral BID   buPROPion  300 mg Oral q AM   Chlorhexidine Gluconate Cloth  6 each Topical Daily   colchicine  0.6 mg Oral BID   diltiazem  180 mg Oral Daily   estrogens (conjugated)  0.45 mg Oral Daily   gabapentin  600 mg Oral TID   insulin aspart  0-15 Units Subcutaneous TID WC   metoprolol tartrate  50 mg Oral BID   oxyCODONE  15 mg Oral QID   pantoprazole  40 mg Oral Daily   sodium chloride flush  3 mL Intravenous Q12H   traZODone  200 mg Oral QHS   Continuous Infusions:  sodium chloride     PRN Meds: sodium chloride, acetaminophen, albuterol, ondansetron (ZOFRAN) IV, sodium chloride flush   Vital Signs    Vitals:   05/15/22 0600 05/15/22 0700 05/15/22 0737 05/15/22 0800  BP: 92/65 113/87  105/70  Pulse: 79 81  85  Resp: '17 18  17  '$ Temp:   98.2 F (36.8 C)   TempSrc:   Oral   SpO2: 96% 97%  99%  Weight:      Height:        Intake/Output Summary (Last 24 hours) at 05/15/2022 0908 Last data filed at 05/15/2022 0600 Gross per 24 hour  Intake 505 ml  Output 395 ml  Net 110 ml      05/12/2022    6:45 AM 05/11/2022    6:30 AM 05/10/2022    6:15 AM  Last 3 Weights  Weight (lbs) 217 lb 9.5 oz 219 lb 5.7 oz 212 lb  Weight (kg) 98.7 kg 99.5 kg 96.163 kg      Telemetry    Atrial flutter with heart rates in the  90s To 130s at times personally Reviewed  ECG    No new EKG to review- Personally Reviewed  Physical Exam   GEN: Well nourished, well developed in no acute distress HEENT: Normal NECK: No JVD; No carotid bruits LYMPHATICS: No lymphadenopathy CARDIAC: Irregular and tachycardic at times, no murmurs, rubs, gallops RESPIRATORY:  Clear to auscultation without rales, wheezing or rhonchi  ABDOMEN: Soft, non-tender, non-distended MUSCULOSKELETAL:  No edema; No deformity  SKIN: Warm and dry NEUROLOGIC:  Alert and oriented x 3 PSYCHIATRIC:  Normal affect   Labs    High Sensitivity Troponin:   Recent Labs  Lab 05/02/22 1422 05/02/22 1700 05/10/22 0603  TROPONINIHS 68* 64* 33*      Chemistry Recent Labs  Lab 05/10/22 0603 05/10/22 0611 05/11/22 0206 05/13/22 0103  NA 135 136 139 139  K 4.2 4.1 4.1 4.4  CL 107 106 114* 113*  CO2 18*  --  19* 19*  GLUCOSE 140* 134* 100* 87  BUN '21 21 16 8  '$ CREATININE 2.10* 2.10* 1.10* 0.93  CALCIUM 8.1*  --  7.5* 8.3*  PROT 6.2*  --   --   --   ALBUMIN 3.2*  --   --   --   AST 22  --   --   --   ALT 15  --   --   --   ALKPHOS 125  --   --   --   BILITOT 0.6  --   --   --   GFRNONAA 26*  --  57* >60  ANIONGAP 10  --  6 7     Hematology Recent Labs  Lab 05/10/22 0603 05/10/22 0611 05/13/22 0103  WBC 8.2  --  6.4  RBC 3.62*  --  3.12*  HGB 10.3* 11.2* 9.0*  HCT 32.1* 33.0* 27.6*  MCV 88.7  --  88.5  MCH 28.5  --  28.8  MCHC 32.1  --  32.6  RDW 14.7  --  14.9  PLT 293  --  204    BNPNo results for input(s): "BNP", "PROBNP" in the last 168 hours.   DDimer No results for input(s): "DDIMER" in the last 168 hours.   CHA2DS2-VASc Score = 3   This indicates a 3.2% annual risk of stroke. The patient's score is based upon: CHF History: 0 HTN History: 1 Diabetes History: 1 Stroke History: 0 Vascular Disease History: 0 Age Score: 0 Gender Score: 1  Radiology    ECHOCARDIOGRAM LIMITED  Result Date: 05/13/2022     ECHOCARDIOGRAM LIMITED REPORT   Patient Name:   [name] Date of Exam: 05/13/2022 Medical Rec #:  [id number]         Height:       71.0 in Accession #:    [id number]        Weight:       217.6 lb Date of Birth:  1961-07-30         BSA:          2.185 m Patient Age:    61 years          BP:           147/128 mmHg Patient Gender: F                 HR:           98 bpm. Exam Location:  Inpatient Procedure: Limited Echo, Color Doppler and Limited Color Doppler Indications:    Pericardial effusion  History:        Patient has prior history of Echocardiogram examinations, most                 recent 04/23/2022. Signs/Symptoms:Murmur; Risk Factors:Diabetes                 and Hypertension. 05/10/22 pericardiocentesis                 MV and TV repair 04/19/22.                  Mitral Valve: 30 mm prosthetic annuloplasty ring valve is                 present in the mitral position. Procedure Date: 04/27/22.  Sonographer:    Luisa Hart RDCS Referring Phys: [place]  1. Moderate septal hypertrophy with otherwise mild LVH. Basal to mid inferior and inferoseptal hypokinesis. Left ventricular ejection fraction, by estimation, is 45 to 50%. The left ventricle has mildly decreased function. The left ventricle demonstrates regional wall motion abnormalities (see  scoring diagram/findings for description). There is moderate left ventricular hypertrophy.  2. Right ventricular systolic function is normal. The right ventricular size is normal. There is mildly elevated pulmonary artery systolic pressure.  3. Left atrial size was moderately dilated.  4. The mitral valve has been repaired/replaced. Trivial mitral valve regurgitation. No evidence of mitral stenosis. There is a 30 mm prosthetic annuloplasty ring present in the mitral position. Procedure Date: 04/27/22.  5. Pericardial effusion has resolved.  6. The tricuspid valve is has been repaired/replaced. The tricuspid valve is status post repair with an  annuloplasty ring. Tricuspid valve regurgitation is mild.  7. The aortic valve is normal in structure. Aortic valve regurgitation is not visualized. No aortic stenosis is present.  8. The inferior vena cava is normal in size with greater than 50% respiratory variability, suggesting right atrial pressure of 3 mmHg. FINDINGS  Left Ventricle: Moderate septal hypertrophy with otherwise mild LVH. Basal to mid inferior and inferoseptal hypokinesis. Left ventricular ejection fraction, by estimation, is 45 to 50%. The left ventricle has mildly decreased function. The left ventricle demonstrates regional wall motion abnormalities. The left ventricular internal cavity size was normal in size. There is moderate left ventricular hypertrophy. Right Ventricle: The right ventricular size is normal. No increase in right ventricular wall thickness. Right ventricular systolic function is normal. There is mildly elevated pulmonary artery systolic pressure. The tricuspid regurgitant velocity is 2.73  m/s, and with an assumed right atrial pressure of 15 mmHg, the estimated right ventricular systolic pressure is 98.9 mmHg. Left Atrium: Left atrial size was moderately dilated. Right Atrium: Right atrial size was normal in size. Pericardium: Pericardial effusion has resolved. There is no evidence of pericardial effusion. There is excessive respiratory variation in the tricuspid valve spectral Doppler velocities. Mitral Valve: The mitral valve has been repaired/replaced. Trivial mitral valve regurgitation. There is a 30 mm prosthetic annuloplasty ring present in the mitral position. Procedure Date: 04/27/22. No evidence of mitral valve stenosis. The mean mitral valve gradient is 6.4 mmHg with average heart rate of 76 bpm. Tricuspid Valve: The tricuspid valve is has been repaired/replaced. Tricuspid valve regurgitation is mild . No evidence of tricuspid stenosis. 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 Aortic  Valve: The aortic valve is normal in structure. Aortic valve regurgitation is not visualized. No aortic stenosis is present. Aortic valve mean gradient measures 2.0 mmHg. Aortic valve peak gradient measures 3.9 mmHg. Pulmonic Valve: The pulmonic valve was normal in structure. Pulmonic valve regurgitation is not visualized. No evidence of pulmonic stenosis. Aorta: The aortic root is normal in size and structure. Venous: The inferior vena cava is normal in size with greater than 50% respiratory variability, suggesting right atrial pressure of 3 mmHg. IAS/Shunts: No atrial level shunt detected by color flow Doppler. LEFT VENTRICLE PLAX 2D LVIDd:         4.70 cm LVIDs:         3.70 cm LV PW:         1.10 cm LV IVS:        1.50 cm  LEFT ATRIUM           Index LA Vol (A4C): 75.2 ml 34.41 ml/m  AORTIC VALVE                   PULMONIC VALVE AV Vmax:           98.65 cm/s  PV Vmax:       0.72 m/s AV  Vmean:          66.650 cm/s PV Vmean:      46.450 cm/s AV VTI:            0.150 m     PV VTI:        0.118 m AV Peak Grad:      3.9 mmHg    PV Peak grad:  2.1 mmHg AV Mean Grad:      2.0 mmHg    PV Mean grad:  1.0 mmHg LVOT Vmax:         65.80 cm/s LVOT Vmean:        40.100 cm/s LVOT VTI:          0.107 m LVOT/AV VTI ratio: 0.71  AORTA Ao Asc diam: 3.50 cm MITRAL VALVE                TRICUSPID VALVE MV Mean grad: 6.4 mmHg      TR Peak grad:   29.8 mmHg MR Peak grad: 77.8 mmHg     TR Vmax:        273.00 cm/s MR Mean grad: 57.0 mmHg MR Vmax:      441.00 cm/s   SHUNTS MR Vmean:     368.0 cm/s    Systemic VTI: 0.11 m MV E velocity: 190.40 cm/s Skeet Latch MD Electronically signed by Skeet Latch MD Signature Date/Time: 05/13/2022/3:03:20 PM    Final     Cardiac Studies  2D echo 05/13/2022 IMPRESSIONS    1. Moderate septal hypertrophy with otherwise mild LVH. Basal to mid  inferior and inferoseptal hypokinesis. Left ventricular ejection fraction,  by estimation, is 45 to 50%. The left ventricle has mildly decreased   function. The left ventricle  demonstrates regional wall motion abnormalities (see scoring  diagram/findings for description). There is moderate left ventricular  hypertrophy.   2. Right ventricular systolic function is normal. The right ventricular  size is normal. There is mildly elevated pulmonary artery systolic  pressure.   3. Left atrial size was moderately dilated.   4. The mitral valve has been repaired/replaced. Trivial mitral valve  regurgitation. No evidence of mitral stenosis. There is a 30 mm prosthetic  annuloplasty ring present in the mitral position. Procedure Date: 04/27/22.   5. Pericardial effusion has resolved.   6. The tricuspid valve is has been repaired/replaced. The tricuspid valve  is status post repair with an annuloplasty ring. Tricuspid valve  regurgitation is mild.   7. The aortic valve is normal in structure. Aortic valve regurgitation is  not visualized. No aortic stenosis is present.   8. The inferior vena cava is normal in size with greater than 50%  respiratory variability, suggesting right atrial pressure of 3 mmHg.   2D echo 05/10/22 IMPRESSIONS    1. Right ventricle does not relax completely suggesting increase in  intrapericardial pressure.Subtle appreciation for ventricular  interdependence. Full study for tamponade physiology not performed: Tissue  doppler, MV/TV respiratory variation and IVC.  Moderate pericardial effusion. The pericardial effusion is  circumferential.   2. Left ventricular ejection fraction, by estimation, is 55 to 60%. The  left ventricle has normal function. Left ventricular diastolic function  could not be evaluated.   3. Right ventricular systolic function is normal.   4. The mitral valve is grossly normal.   5. The aortic valve was not well visualized.   Patient Profile     61 y.o. female with hypertension, diabetes, fibromyalgia, history of hep C s/p therapy,  distant substance abuse, tobacco abuse, pAF and post op  AF, severe MR and TR confirmed on TEE with abnormal inflammatory/rheumatic valves now s/p MVR and TV annuloplasty who presents with dizziness and chest pain.   Assessment & Plan    Cardiac tamponade/Pericardial effusion  -Patient presented to ER in shock and found to have pericardial effusion with tamponade on bedside echo -Status post recent MVR/TVA and likely redo to postop pericarditis leading to effusion -Emergent OR was not available on patient arrival in the ER and therefore patient underwent emergent pericardiocentesis of 400 cc of pink/bloody fluid serous fluid drained. Did not appear completely hemorrhagic so may just be post op pericarditis leading to pericardial effusion.  -Pericardial drain output 125 cc today - 2D echo showed resolution of pericardial effusion - Pulled drain today, no complications.  -Warfarin on hold, will resuming now.  -Continue colchicine 0.6 mg twice daily, would continue for at least 3 mo.   Severe MR/TR  -s/p MVR and TV annuloplasty  -Holding warfarin until drain pericardial removed.  -Ideally on warfarin 3 months post op.  -INR 2.1 on admission and Kcentra given to normalize INR for drain.   - will consult pharmacy to resume warfarin. -continue home ASA 81 mg daily    3.  HTN -BP much improved after restarting Cardizem at 119/77 mmHg today -Continue metoprolol 50 mg twice daily and Cardizem CD 180 mg daily   4.  AF/AFL -Distant prior pAF and recent AFL post op.  -OP monitor on 01/21/22 without atrial arrhythmias.  -Severe LA dilation in 01/2022 however also in the setting of severe MR prior to repair.  -She likely will require an AAD to maintain NSR.  -Currently in atrial flutter with heart rate anywhere from upper 90s to 130s at times -Tx as above to see if AFL converts to NSR-If persistent will need to schedule cardioversion once she has recovered from pericardial tamponade and can tolerate OAC again.  -May need short course of amiodarone if  she starts to have AFL/AF with RVR again and/or for DCCV as OP.  -Heart rate improved and mainly in the 90s but does at times go up into the 130s -Continue metoprolol 50 mg twice daily and Cardizem CD 180 mg daily -If she continues to spike heart rates may need to consider addition of amiodarone low-dose for heart rate control -holding warfarin while pericardial drain in place   5.  HLD - continue home atorva 20 mg daily     For questions or updates, please contact [place] Please consult [email or web address] for contact info under   CRITICAL CARE Performed by: Cherlynn Kaiser, MD   Total critical care time: 35 minutes   Critical care time was exclusive of separately billable procedures and treating other patients.   Critical care was necessary to treat or prevent imminent or life-threatening deterioration.   Critical care was time spent personally by me (independent of APPs or residents) on the following activities: development of treatment plan with patient and/or surrogate as well as nursing, discussions with consultants, evaluation of patient's response to treatment, examination of patient, obtaining history from patient or surrogate, ordering and performing treatments and interventions, ordering and review of laboratory studies, ordering and review of radiographic studies, pulse oximetry and re-evaluation of patient's condition.      Signed, Elouise Munroe, MD  05/15/2022, 9:08 AM

## 2022-05-15 NOTE — Plan of Care (Signed)
  Problem: Activity: Goal: Ability to return to baseline activity level will improve Outcome: Progressing   Problem: Health Behavior/Discharge Planning: Goal: Ability to safely manage health-related needs after discharge will improve Outcome: Progressing   Problem: Education: Goal: Knowledge of General Education information will improve Description: Including pain rating scale, medication(s)/side effects and non-pharmacologic comfort measures Outcome: Progressing   Problem: Health Behavior/Discharge Planning: Goal: Ability to manage health-related needs will improve Outcome: Progressing   Problem: Clinical Measurements: Goal: Will remain free from infection Outcome: Progressing Goal: Diagnostic test results will improve Outcome: Progressing Goal: Cardiovascular complication will be avoided Outcome: Progressing   Problem: Activity: Goal: Risk for activity intolerance will decrease Outcome: Progressing   Problem: Nutrition: Goal: Adequate nutrition will be maintained Outcome: Progressing   Problem: Elimination: Goal: Will not experience complications related to bowel motility Outcome: Progressing   Problem: Pain Managment: Goal: General experience of comfort will improve Outcome: Progressing   Problem: Safety: Goal: Ability to remain free from injury will improve Outcome: Progressing

## 2022-05-16 DIAGNOSIS — I314 Cardiac tamponade: Secondary | ICD-10-CM | POA: Diagnosis not present

## 2022-05-16 LAB — CBC
HCT: 29.8 % — ABNORMAL LOW (ref 36.0–46.0)
Hemoglobin: 9.6 g/dL — ABNORMAL LOW (ref 12.0–15.0)
MCH: 28.3 pg (ref 26.0–34.0)
MCHC: 32.2 g/dL (ref 30.0–36.0)
MCV: 87.9 fL (ref 80.0–100.0)
Platelets: 219 10*3/uL (ref 150–400)
RBC: 3.39 MIL/uL — ABNORMAL LOW (ref 3.87–5.11)
RDW: 14.8 % (ref 11.5–15.5)
WBC: 5.3 10*3/uL (ref 4.0–10.5)
nRBC: 0 % (ref 0.0–0.2)

## 2022-05-16 LAB — PROTIME-INR
INR: 1.2 (ref 0.8–1.2)
Prothrombin Time: 14.8 seconds (ref 11.4–15.2)

## 2022-05-16 LAB — GLUCOSE, CAPILLARY
Glucose-Capillary: 108 mg/dL — ABNORMAL HIGH (ref 70–99)
Glucose-Capillary: 111 mg/dL — ABNORMAL HIGH (ref 70–99)
Glucose-Capillary: 126 mg/dL — ABNORMAL HIGH (ref 70–99)
Glucose-Capillary: 94 mg/dL (ref 70–99)

## 2022-05-16 LAB — BASIC METABOLIC PANEL
Anion gap: 8 (ref 5–15)
BUN: 8 mg/dL (ref 8–23)
CO2: 24 mmol/L (ref 22–32)
Calcium: 8.1 mg/dL — ABNORMAL LOW (ref 8.9–10.3)
Chloride: 105 mmol/L (ref 98–111)
Creatinine, Ser: 0.92 mg/dL (ref 0.44–1.00)
GFR, Estimated: >60 mL/min (ref >60)
Glucose, Bld: 122 mg/dL — ABNORMAL HIGH (ref 70–99)
Potassium: 4.1 mmol/L (ref 3.5–5.1)
Sodium: 137 mmol/L (ref 135–145)

## 2022-05-16 MED ORDER — WARFARIN SODIUM 2.5 MG PO TABS: 2.5000 mg | ORAL_TABLET | Freq: Once | ORAL | Status: DC

## 2022-05-16 NOTE — Progress Notes (Signed)
6 Days Post-Op Procedure(s) (LRB): PERICARDIOCENTESIS (N/A) Subjective: Some sternal soreness, o/w feels well   Objective: Vital signs in last 24 hours: Temp:  [98.1 F (36.7 C)-98.8 F (37.1 C)] 98.4 F (36.9 C) (06/11 0800) Pulse Rate:  [80-95] 85 (06/10 1500) Cardiac Rhythm: Atrial flutter (06/11 0800) Resp:  [13-23] 18 (06/11 0315) BP: (85-125)/(69-92) 125/92 (06/11 0635) SpO2:  [92 %-98 %] 98 % (06/11 0635)  Hemodynamic parameters for last 24 hours:    Intake/Output from previous day: 06/10 0701 - 06/11 0700 In: 665 [P.O.:660] Out: 1735 [Urine:1710; Chest Tube:25] Intake/Output this shift: Total I/O In: 100 [P.O.:100] Out: -   General appearance: alert, cooperative, and no distress Neurologic: intact Heart: regular rate and rhythm and Flutter Lungs: clear to auscultation bilaterally Wound: clean and dry  Lab Results: Recent Labs    05/15/22 1439  WBC 5.8  HGB 9.3*  HCT 29.5*  PLT 214   BMET: No results for input(s): "NA", "K", "CL", "CO2", "GLUCOSE", "BUN", "CREATININE", "CALCIUM" in the last 72 hours.  PT/INR:  Recent Labs    05/15/22 1439  LABPROT 14.8  INR 1.2   ABG    Component Value Date/Time   PHART 7.319 (L) 04/19/2022 2202   HCO3 20.9 04/19/2022 2202   TCO2 18 (L) 05/10/2022 0611   ACIDBASEDEF 5.0 (H) 04/19/2022 2202   O2SAT 99 04/19/2022 2202   CBG (last 3)  Recent Labs    05/15/22 1636 05/15/22 2133 05/16/22 0647  GLUCAP 95 111* 108*    Assessment/Plan: S/P Procedure(s) (LRB): PERICARDIOCENTESIS (N/A) Awaiting bed on progressive unit Remains in flutter. Tolerating well with rate controlled Pericardial drain removed Ok from my standpoint to resume anticoagulation for flutter    LOS: 6 days    Melrose Nakayama 05/16/2022

## 2022-05-16 NOTE — Progress Notes (Addendum)
ANTICOAGULATION CONSULT NOTE - Initial Consult  Pharmacy Consult for warfarin Indication: atrial fibrillation  Allergies  Allergen Reactions   Hydrocodone-Acetaminophen Itching   Hydrocodone Itching    Tolerates oxycodone     Patient Measurements: Height: '5\' 11"'$  (180.3 cm) Weight: 98.7 kg (217 lb 9.5 oz) IBW/kg (Calculated) : 70.8   Vital Signs: Temp: 97.7 F (36.5 C) (06/11 1138) Temp Source: Oral (06/11 1138) BP: 125/92 (06/11 0635)  Labs: Recent Labs    05/15/22 1439 05/16/22 0850  HGB 9.3* 9.6*  HCT 29.5* 29.8*  PLT 214 219  LABPROT 14.8  --   INR 1.2  --   CREATININE  --  0.92     Estimated Creatinine Clearance: 82.1 mL/min (by C-G formula based on SCr of 0.92 mg/dL).   Medical History: Past Medical History:  Diagnosis Date   Arthritis    Cervicalgia    Chondromalacia of patella    Chronic neck pain    Chronic pain syndrome    Depression    secondary to loss of her son at age 63   Diabetes mellitus    Diabetic neuropathy ([place])    DMII (diabetes mellitus, type 2) ([place])    Enthesopathy of hip region    Fibromyalgia    Hep C w/o coma, chronic ([place])    Hepatitis C    diagnosed 2005   Hypertension    Insomnia    Insomnia    Low back pain    Lumbago    Mitral regurgitation    Obesity    Pain in joint, upper arm    Primary localized osteoarthrosis, lower leg    Substance abuse ([place])    sober since 2001   Tobacco abuse    Tricuspid regurgitation    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    Medications:  Scheduled:   aspirin EC  81 mg Oral Daily   atorvastatin  20 mg Oral Daily   baclofen  10 mg Oral BID   buPROPion  300 mg Oral q AM   Chlorhexidine Gluconate Cloth  6 each Topical Daily   colchicine  0.6 mg Oral BID   diltiazem  180 mg Oral Daily   estrogens (conjugated)  0.45 mg Oral Daily   gabapentin  600 mg Oral TID   insulin aspart  0-15 Units Subcutaneous TID WC   metoprolol tartrate  50 mg Oral BID   oxyCODONE  15 mg  Oral QID   pantoprazole  40 mg Oral Daily   sodium chloride flush  3 mL Intravenous Q12H   traZODone  200 mg Oral QHS   warfarin  5 mg Oral ONCE-1600   Warfarin - Pharmacist Dosing Inpatient   Does not apply q1600    Assessment: 61 yo F s/p recent MVR and TVR on 5/15 which was c/b postop Afib/flutter.  She was sent home on 5 mg TTS, 2 mg AOD and INR was 2.5 at discharge.  Patient returned to ED 4 days later with CP.  Cardiac work up unremarkable.  INR was 1.8, no changes made.  Patient presented 6/5 with significant CP and found to have pericardial effusion c/w tamponade on bedside Echo.  INR was 2.1 and warfarin reversed with Kcentra/VitK.  She was then emergently taken for pericardiocentesis with 400cc pink/red bloody serous fluid removed.  Pharmacy consulted for warfarin dosing s/p pericardial drain removal 6/10.   PTA warfarin regimen = 5 mg TTS; 2 mg AOD  INR 1.2 today.  Didn't receive 5 mg  dose yesterday.  CBC stable.  Goal of Therapy:  INR 2-3 Monitor platelets by anticoagulation protocol: Yes   Plan:  Give warfarin 5 mg today, then resume home schedule Daily INR, CBC  Laurey Arrow, PharmD PGY1 Pharmacy Resident 05/16/2022  12:43 PM  Please check [email or web address] for unit-specific pharmacy phone numbers.

## 2022-05-16 NOTE — Progress Notes (Signed)
Progress Note  Patient Name: [name] Date of Encounter: 05/16/2022  [facility] Cardiologist: Werner Lean, MD   Subjective   Patient presented with chest pain and dizziness status post recent MVR and TVA found to be in shock with pericardial effusion by bedside echo consistent with tamponade.  No available OR room for emergent case and patient underwent emergent pericardiocentesis of 400 cc pink/red bloody serous fluid.  Pericardial drain out 6/10, doing well. Resuming AC today.  Inpatient Medications    Scheduled Meds:  aspirin EC  81 mg Oral Daily   atorvastatin  20 mg Oral Daily   baclofen  10 mg Oral BID   buPROPion  300 mg Oral q AM   Chlorhexidine Gluconate Cloth  6 each Topical Daily   colchicine  0.6 mg Oral BID   diltiazem  180 mg Oral Daily   estrogens (conjugated)  0.45 mg Oral Daily   gabapentin  600 mg Oral TID   insulin aspart  0-15 Units Subcutaneous TID WC   metoprolol tartrate  50 mg Oral BID   oxyCODONE  15 mg Oral QID   pantoprazole  40 mg Oral Daily   sodium chloride flush  3 mL Intravenous Q12H   traZODone  200 mg Oral QHS   warfarin  5 mg Oral ONCE-1600   Warfarin - Pharmacist Dosing Inpatient   Does not apply q1600   Continuous Infusions:  sodium chloride     PRN Meds: sodium chloride, acetaminophen, albuterol, ondansetron (ZOFRAN) IV, sodium chloride flush   Vital Signs    Vitals:   05/16/22 0315 05/16/22 0635 05/16/22 0645 05/16/22 0800  BP: 109/78 (!) 125/92    Pulse:      Resp: 18     Temp: 98.4 F (36.9 C)  98.5 F (36.9 C) 98.4 F (36.9 C)  TempSrc: Oral  Oral Oral  SpO2: 97% 98%    Weight:      Height:        Intake/Output Summary (Last 24 hours) at 05/16/2022 1058 Last data filed at 05/16/2022 0800 Gross per 24 hour  Intake 765 ml  Output 1735 ml  Net -970 ml      05/12/2022    6:45 AM 05/11/2022    6:30 AM 05/10/2022    6:15 AM  Last 3 Weights  Weight (lbs) 217 lb 9.5 oz 219 lb 5.7 oz 212 lb  Weight  (kg) 98.7 kg 99.5 kg 96.163 kg      Telemetry    Atrial flutter with heart rates in the 70s-90s at times personally Reviewed  ECG    No new - Personally Reviewed  Physical Exam   GEN: Well nourished, well developed in no acute distress HEENT: Normal NECK: No JVD; No carotid bruits CARDIAC: Irregular and tachycardic at times, no murmurs, rubs, gallops RESPIRATORY:  Clear to auscultation without rales, wheezing or rhonchi  ABDOMEN: Soft, non-tender, non-distended MUSCULOSKELETAL:  No edema; No deformity  SKIN: Warm and dry NEUROLOGIC:  Alert and oriented x 3 PSYCHIATRIC:  Normal affect   Labs    High Sensitivity Troponin:   Recent Labs  Lab 05/02/22 1422 05/02/22 1700 05/10/22 0603  TROPONINIHS 68* 64* 33*      Chemistry Recent Labs  Lab 05/10/22 0603 05/10/22 0611 05/11/22 0206 05/13/22 0103 05/16/22 0850  NA 135   < > 139 139 137  K 4.2   < > 4.1 4.4 4.1  CL 107   < > 114* 113* 105  CO2  18*  --  19* 19* 24  GLUCOSE 140*   < > 100* 87 122*  BUN 21   < > '16 8 8  '$ CREATININE 2.10*   < > 1.10* 0.93 0.92  CALCIUM 8.1*  --  7.5* 8.3* 8.1*  PROT 6.2*  --   --   --   --   ALBUMIN 3.2*  --   --   --   --   AST 22  --   --   --   --   ALT 15  --   --   --   --   ALKPHOS 125  --   --   --   --   BILITOT 0.6  --   --   --   --   GFRNONAA 26*  --  57* >60 >60  ANIONGAP 10  --  '6 7 8   '$ < > = values in this interval not displayed.     Hematology Recent Labs  Lab 05/13/22 0103 05/15/22 1439 05/16/22 0850  WBC 6.4 5.8 5.3  RBC 3.12* 3.28* 3.39*  HGB 9.0* 9.3* 9.6*  HCT 27.6* 29.5* 29.8*  MCV 88.5 89.9 87.9  MCH 28.8 28.4 28.3  MCHC 32.6 31.5 32.2  RDW 14.9 14.8 14.8  PLT 204 214 219    BNPNo results for input(s): "BNP", "PROBNP" in the last 168 hours.   DDimer No results for input(s): "DDIMER" in the last 168 hours.   CHA2DS2-VASc Score = 3   This indicates a 3.2% annual risk of stroke. The patient's score is based upon: CHF History: 0 HTN History:  1 Diabetes History: 1 Stroke History: 0 Vascular Disease History: 0 Age Score: 0 Gender Score: 1  Radiology    DG CHEST PORT 1 VIEW  Result Date: 05/15/2022 CLINICAL DATA:  Chest pain. EXAM: PORTABLE CHEST 1 VIEW COMPARISON:  05/10/2022 and older studies.  CT, 12/18/2020. FINDINGS: Previous cardiac surgery and valve replacement, stable from the most recent prior exam, new compared to the 12/18/2020 CT. Cardiac silhouette normal in size. No mediastinal or hilar masses. Hazy opacity in the right lung base, new since prior chest radiograph. There are linear opacities in the right mid lung, also new. Mild linear opacity and peripheral pleural based opacity at the left lung base, also new. Remainder of the lungs is clear. Suspect small effusions, greater on the right. No pneumothorax. Skeletal structures are grossly intact. IMPRESSION: 1. New lung base opacities as detailed, greater on the right. Suspect small effusions and associated atelectasis. Consider pneumonia in the proper clinical setting. No evidence of pulmonary edema. Electronically Signed   By: Lajean Manes M.D.   On: 05/15/2022 12:30    Cardiac Studies  2D echo 05/13/2022 IMPRESSIONS    1. Moderate septal hypertrophy with otherwise mild LVH. Basal to mid  inferior and inferoseptal hypokinesis. Left ventricular ejection fraction,  by estimation, is 45 to 50%. The left ventricle has mildly decreased  function. The left ventricle  demonstrates regional wall motion abnormalities (see scoring  diagram/findings for description). There is moderate left ventricular  hypertrophy.   2. Right ventricular systolic function is normal. The right ventricular  size is normal. There is mildly elevated pulmonary artery systolic  pressure.   3. Left atrial size was moderately dilated.   4. The mitral valve has been repaired/replaced. Trivial mitral valve  regurgitation. No evidence of mitral stenosis. There is a 30 mm prosthetic  annuloplasty ring  present in the mitral position.  Procedure Date: 04/27/22.   5. Pericardial effusion has resolved.   6. The tricuspid valve is has been repaired/replaced. The tricuspid valve  is status post repair with an annuloplasty ring. Tricuspid valve  regurgitation is mild.   7. The aortic valve is normal in structure. Aortic valve regurgitation is  not visualized. No aortic stenosis is present.   8. The inferior vena cava is normal in size with greater than 50%  respiratory variability, suggesting right atrial pressure of 3 mmHg.   2D echo 05/10/22 IMPRESSIONS    1. Right ventricle does not relax completely suggesting increase in  intrapericardial pressure.Subtle appreciation for ventricular  interdependence. Full study for tamponade physiology not performed: Tissue  doppler, MV/TV respiratory variation and IVC.  Moderate pericardial effusion. The pericardial effusion is  circumferential.   2. Left ventricular ejection fraction, by estimation, is 55 to 60%. The  left ventricle has normal function. Left ventricular diastolic function  could not be evaluated.   3. Right ventricular systolic function is normal.   4. The mitral valve is grossly normal.   5. The aortic valve was not well visualized.   Patient Profile     61 y.o. female with hypertension, diabetes, fibromyalgia, history of hep C s/p therapy, distant substance abuse, tobacco abuse, pAF and post op AF, severe MR and TR confirmed on TEE with abnormal inflammatory/rheumatic valves now s/p MVR and TV annuloplasty who presents with dizziness and chest pain.   Assessment & Plan    Cardiac tamponade/Pericardial effusion  -Patient presented to ER in shock and found to have pericardial effusion with tamponade on bedside echo -Status post recent MVR/TVA and likely redo to postop pericarditis leading to effusion -Emergent OR was not available on patient arrival in the ER and therefore patient underwent emergent pericardiocentesis of 400 cc of  pink/bloody fluid serous fluid drained. Did not appear completely hemorrhagic so may just be post op pericarditis leading to pericardial effusion.  -Pericardial drain output 125 cc today - 2D echo showed resolution of pericardial effusion - Pulled drain, no complications.  -Warfarin on hold, will resuming now.  -Continue colchicine 0.6 mg twice daily, would continue for 3 mo.   Severe MR/TR  -s/p MVR and TV annuloplasty  -Holding warfarin until drain pericardial removed.  -Ideally on warfarin 3 months post op.  -INR 2.1 on admission and Kcentra given to normalize INR for drain.  INR 1.2 yesterday.  - will consult pharmacy to resume warfarin. First dose today, ok with TCTS. -continue home ASA 81 mg daily    3.  HTN -BP much improved after restarting Cardizem at 119/77 mmHg today -Continue metoprolol 50 mg twice daily and Cardizem CD 180 mg daily   4.  AF/AFL -Distant prior pAF and recent AFL post op.  -OP monitor on 01/21/22 without atrial arrhythmias.  -Severe LA dilation in 01/2022 however also in the setting of severe MR prior to repair.  -She likely will require an AAD to maintain NSR.  -Currently in atrial flutter with heart rate anywhere from upper 90s to 130s at times -Tx as above to see if AFL converts to NSR-If persistent will need to schedule cardioversion once she has recovered from pericardial tamponade and can tolerate OAC again, will allow INR to drift up so may need this at output follow up.  -May need short course of amiodarone, will consider when INR therapeutic. -Heart rate improved  -Continue metoprolol 50 mg twice daily and Cardizem CD 180 mg daily, pressure better today.  -  If she continues to spike heart rates may need to consider addition of amiodarone low-dose for heart rate control -holding warfarin while pericardial drain in place   5.  HLD - continue home atorva 20 mg daily     For questions or updates, please contact [place] Please consult  [email or web address] for contact info under

## 2022-05-17 ENCOUNTER — Telehealth: Payer: Self-pay | Admitting: Internal Medicine

## 2022-05-17 ENCOUNTER — Encounter (HOSPITAL_COMMUNITY): Payer: Self-pay | Admitting: Interventional Cardiology

## 2022-05-17 ENCOUNTER — Other Ambulatory Visit: Payer: Self-pay | Admitting: Thoracic Surgery (Cardiothoracic Vascular Surgery)

## 2022-05-17 ENCOUNTER — Other Ambulatory Visit (HOSPITAL_COMMUNITY): Payer: Self-pay

## 2022-05-17 DIAGNOSIS — I314 Cardiac tamponade: Secondary | ICD-10-CM | POA: Diagnosis not present

## 2022-05-17 DIAGNOSIS — N179 Acute kidney failure, unspecified: Secondary | ICD-10-CM

## 2022-05-17 DIAGNOSIS — Z952 Presence of prosthetic heart valve: Secondary | ICD-10-CM

## 2022-05-17 DIAGNOSIS — I309 Acute pericarditis, unspecified: Secondary | ICD-10-CM

## 2022-05-17 DIAGNOSIS — Z9889 Other specified postprocedural states: Secondary | ICD-10-CM

## 2022-05-17 DIAGNOSIS — Z7901 Long term (current) use of anticoagulants: Secondary | ICD-10-CM

## 2022-05-17 HISTORY — DX: Other specified postprocedural states: Z98.890

## 2022-05-17 HISTORY — DX: Acute pericarditis, unspecified: I30.9

## 2022-05-17 HISTORY — DX: Acute kidney failure, unspecified: N17.9

## 2022-05-17 LAB — PROTIME-INR
INR: 1.1 (ref 0.8–1.2)
Prothrombin Time: 14.1 seconds (ref 11.4–15.2)

## 2022-05-17 LAB — CBC
HCT: 27.4 % — ABNORMAL LOW (ref 36.0–46.0)
Hemoglobin: 8.9 g/dL — ABNORMAL LOW (ref 12.0–15.0)
MCH: 28.8 pg (ref 26.0–34.0)
MCHC: 32.5 g/dL (ref 30.0–36.0)
MCV: 88.7 fL (ref 80.0–100.0)
Platelets: 204 10*3/uL (ref 150–400)
RBC: 3.09 MIL/uL — ABNORMAL LOW (ref 3.87–5.11)
RDW: 14.8 % (ref 11.5–15.5)
WBC: 4.7 10*3/uL (ref 4.0–10.5)
nRBC: 0 % (ref 0.0–0.2)

## 2022-05-17 LAB — GLUCOSE, CAPILLARY
Glucose-Capillary: 135 mg/dL — ABNORMAL HIGH (ref 70–99)
Glucose-Capillary: 90 mg/dL (ref 70–99)

## 2022-05-17 MED ORDER — METOPROLOL TARTRATE 50 MG PO TABS
50.0000 mg | ORAL_TABLET | Freq: Two times a day (BID) | ORAL | 6 refills | Status: DC
  Filled 2022-05-17: qty 60, 30d supply, fill #0

## 2022-05-17 MED ORDER — WARFARIN SODIUM 5 MG PO TABS: 5.0000 mg | ORAL_TABLET | Freq: Once | ORAL | Status: DC

## 2022-05-17 MED ORDER — DILTIAZEM HCL ER COATED BEADS 180 MG PO CP24
180.0000 mg | ORAL_CAPSULE | Freq: Every day | ORAL | 6 refills | Status: DC
  Filled 2022-05-17: qty 30, 30d supply, fill #0

## 2022-05-17 MED ORDER — COLCHICINE 0.6 MG PO TABS
0.6000 mg | ORAL_TABLET | Freq: Two times a day (BID) | ORAL | 2 refills | Status: DC
  Filled 2022-05-17: qty 60, 30d supply, fill #0

## 2022-05-17 MED ORDER — COLCHICINE 0.6 MG PO TABS
0.6000 mg | ORAL_TABLET | Freq: Every day | ORAL | 2 refills | Status: DC
  Filled 2022-05-17 (×2): qty 60, 60d supply, fill #0

## 2022-05-17 MED ORDER — WARFARIN SODIUM 5 MG PO TABS: 5.0000 mg | ORAL_TABLET | Freq: Every day | ORAL | Status: DC

## 2022-05-17 NOTE — Progress Notes (Signed)
7 Days Post-Op Procedure(s) (LRB): PERICARDIOCENTESIS (N/A) Subjective: No complaints, wants to go home  Objective: Vital signs in last 24 hours: Temp:  [98.1 F (36.7 C)-99 F (37.2 C)] 99 F (37.2 C) (06/12 1100) Pulse Rate:  [82-89] 88 (06/12 0929) Cardiac Rhythm: Atrial flutter (06/12 0800) Resp:  [18] 18 (06/12 0800) BP: (120-145)/(74-90) 145/88 (06/12 0929) SpO2:  [95 %-96 %] 96 % (06/12 0800) Weight:  [95.2 kg] 95.2 kg (06/12 0600)  Hemodynamic parameters for last 24 hours:    Intake/Output from previous day: 06/11 0701 - 06/12 0700 In: 400 [P.O.:400] Out: 600 [Urine:600] Intake/Output this shift: No intake/output data recorded.  General appearance: alert, cooperative, and no distress Neurologic: intact Heart: regular rate and rhythm Wound: intact  Lab Results: Recent Labs    05/16/22 0850 05/17/22 0606  WBC 5.3 4.7  HGB 9.6* 8.9*  HCT 29.8* 27.4*  PLT 219 204   BMET:  Recent Labs    05/16/22 0850  NA 137  K 4.1  CL 105  CO2 24  GLUCOSE 122*  BUN 8  CREATININE 0.92  CALCIUM 8.1*    PT/INR:  Recent Labs    05/17/22 0606  LABPROT 14.1  INR 1.1   ABG    Component Value Date/Time   PHART 7.319 (L) 04/19/2022 2202   HCO3 20.9 04/19/2022 2202   TCO2 18 (L) 05/10/2022 0611   ACIDBASEDEF 5.0 (H) 04/19/2022 2202   O2SAT 99 04/19/2022 2202   CBG (last 3)  Recent Labs    05/16/22 2110 05/17/22 0753 05/17/22 1117  GLUCAP 94 135* 90    Assessment/Plan: S/P Procedure(s) (LRB): PERICARDIOCENTESIS (N/A) - Doing well  Coumadin restarted OK for dc from my standpoint when OK with Cardiology   LOS: 7 days    Melrose Nakayama 05/17/2022

## 2022-05-17 NOTE — TOC Initial Note (Signed)
Transition of Care [facility]) - Initial/Assessment Note    Patient Details  Name: [name] MRN: [id number] Date of Birth: [birth year]  [facility] CM/SW Contact:    Bethena Roys, RN Phone Number: 05/17/2022, 11:25 AM  Clinical Narrative:  Patient presented for chest pain and dizziness. PTA patient was from home alone. Patient states she needs personal care services in the home. Case Manager discussed that the patient will need to call her CSW at [place] to arrange services. Case Manager discussed home health services for medication management and lab draws. Patient did not have a preference and is agreeable to [facility] for Services. Referral submitted to [facility] and start of care to begin within 24-48 hours post transition home. Patient reports to RN that she has transportation home. MD aware to place [facility] Orders with F2F. No further needs from Case Manager at this time.               Expected Discharge Plan: [place] Barriers to Discharge: No Barriers Identified   Patient Goals and CMS Choice     Choice offered to / list presented to : Patient (Patient did not have a preference.)  Expected Discharge Plan and Services Expected Discharge Plan: [place] In-house Referral: NA Discharge Planning Services: CM Consult Post Acute Care Choice: [place] arrangements for the past 2 months: Apartment                 DME Arranged: N/A DME Agency: NA       HH Arranged: RN [place] Agency: [place] Date [place]: 05/17/22 Time [place]: 0350 Representative spoke with at [place]: Tommi Rumps  Prior Living Arrangements/Services Living arrangements for the past 2 months: Apartment Lives with:: Self Patient language and need for interpreter reviewed:: Yes Do you feel safe going back to the place where you live?: Yes      Need for Family Participation in Patient Care: No (Comment) Care giver  support system in place?: No (comment)   Criminal Activity/Legal Involvement Pertinent to Current Situation/Hospitalization: No - Comment as needed  Activities of Daily Living Home Assistive Devices/Equipment: Cane (specify quad or straight), Wheelchair, Blood pressure cuff ADL Screening (condition at time of admission) Patient's cognitive ability adequate to safely complete daily activities?: Yes Is the patient deaf or have difficulty hearing?: No Does the patient have difficulty seeing, even when wearing glasses/contacts?: No Does the patient have difficulty concentrating, remembering, or making decisions?: No Patient able to express need for assistance with ADLs?: Yes Does the patient have difficulty dressing or bathing?: Yes Independently performs ADLs?: No Communication: Independent Dressing (OT): Needs assistance Grooming: Needs assistance Feeding: Independent Bathing: Needs assistance Toileting: Needs assistance In/Out Bed: Needs assistance Walks in Home: Needs assistance Does the patient have difficulty walking or climbing stairs?: Yes Weakness of Legs: Both Weakness of Arms/Hands: None  Permission Sought/Granted Permission sought to share information with : Facility Sport and exercise psychologist, Case Production designer, theatre/television/film granted to share info w AGENCY: Alvis Lemmings        Emotional Assessment   Attitude/Demeanor/Rapport: Engaged Affect (typically observed): Appropriate Orientation: : Oriented to Self, Oriented to Place, Oriented to Situation, Oriented to  Time Alcohol / Substance Use: Not Applicable Psych Involvement: No (comment)  Admission diagnosis:  Cardiac tamponade [I31.4] Pericardial tamponade [I31.4] Patient Active Problem List   Diagnosis Date Noted   Acute pericardial effusion s/p pericardiocentesis 05/10/22 05/17/2022  Anticoagulated 05/17/2022   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 05/17/2022   PAF  (paroxysmal atrial fibrillation) (HCC)    Atrial flutter with rapid ventricular response (HCC)    Pericardial tamponade 05/10/2022   Hypotension 04/05/2022   Severe tricuspid regurgitation 04/05/2022   Severe mitral regurgitation 02/11/2022   DOE (dyspnea on exertion) 12/30/2021   Pain of left hand 01/01/2021   Nasal vestibulitis 04/25/2020   FH: CHF (congestive heart failure) 04/05/2020   Acquired hallux interphalangeus 02/26/2020   Hallux rigidus 02/26/2020   Retained orthopedic hardware 02/26/2020   Mechanical complication associated with orthopedic device ([place]) 08/31/2019   Hypercholesterolemia 08/17/2019   Lateral epicondylitis of right elbow 03/31/2018   Arthritis of knee 03/09/2018   Arthritis of foot, degenerative 02/28/2018   Hallux valgus (acquired), left foot 02/28/2018   Long term current use of oral hypoglycemic drug 01/16/2018   Pharyngeal dysphagia 12/21/2017   Left thyroid nodule 12/21/2017   History of colon polyps 09/21/2017   History of atrial fibrillation 08/23/2017   GERD (gastroesophageal reflux disease) 08/23/2017   Presbyopia of both eyes 07/15/2017   Nuclear sclerotic cataract of both eyes 07/15/2017   Dry eye syndrome of both lacrimal glands 07/15/2017   Acquired trigger finger of left ring finger 05/04/2017   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 01/17/2017   Postural dizziness with presyncope 12/30/2016   Paroxysmal atrial fibrillation ([place]) 12/30/2016   Osteoarthritis 12/30/2016   NSAID long-term use 12/30/2016   Anemia 12/30/2016   DDD (degenerative disc disease), lumbar 11/26/2015   Facet syndrome, lumbar 11/26/2015   Greater trochanteric bursitis 11/26/2015   Diabetic neuropathy ([place]) 11/26/2015   Insomnia 11/11/2015   PTSD (post-traumatic stress disorder) 11/11/2015   GAD (generalized anxiety disorder) 11/11/2015   Severe episode of recurrent major depressive disorder, without psychotic features ([place]) 11/11/2015   Depression 11/11/2015    Obesity 06/27/2012   Tobacco use 06/27/2012   Sacroiliac joint dysfunction 05/18/2012   Right low back pain 04/19/2012   Cervical neck pain with evidence of disc disease 02/11/2012   Trochanteric bursitis of right hip 02/11/2012   Plantar fasciitis 03/11/2011   INSOMNIA 01/18/2008   Chronic pain syndrome 12/15/2007   DIABETIC PERIPHERAL NEUROPATHY 05/08/2007   HEPATITIS C 02/27/2007   Type 2 diabetes mellitus without complication, without long-term current use of insulin ([place]) 02/27/2007   DEPRESSION 02/27/2007   Hypertension associated with diabetes ([place]) 02/27/2007   PCP:  [place]:   [facility], [place] [address] Phone: [phone] Fax: [place], [place]  [address] Phone: [phone] Fax: [place], [place] - [address] [place] [address] Phone: [phone] Fax: [phone]   Readmission Risk Interventions     No data to display

## 2022-05-17 NOTE — Progress Notes (Signed)
Patient discharged at 7 with friend. VSS. IV removed, discharge paperwork reviewed, TOC medications in hand.

## 2022-05-17 NOTE — Progress Notes (Signed)
ANTICOAGULATION CONSULT NOTE  Pharmacy Consult for warfarin Indication: atrial fibrillation  Allergies  Allergen Reactions   Hydrocodone-Acetaminophen Itching   Hydrocodone Itching    Tolerates oxycodone     Patient Measurements: Height: '5\' 11"'$  (180.3 cm) Weight: 95.2 kg (209 lb 14.1 oz) IBW/kg (Calculated) : 70.8   Vital Signs: Temp: 98.9 F (37.2 C) (06/12 0700) Temp Source: Oral (06/12 0700) BP: 145/88 (06/12 0929) Pulse Rate: 88 (06/12 0929)  Labs: Recent Labs    05/15/22 1439 05/16/22 0850 05/16/22 1410 05/17/22 0606  HGB 9.3* 9.6*  --  8.9*  HCT 29.5* 29.8*  --  27.4*  PLT 214 219  --  204  LABPROT 14.8  --  14.8 14.1  INR 1.2  --  1.2 1.1  CREATININE  --  0.92  --   --      Estimated Creatinine Clearance: 80.7 mL/min (by C-G formula based on SCr of 0.92 mg/dL).   Medical History: Past Medical History:  Diagnosis Date   Arthritis    Cervicalgia    Chondromalacia of patella    Chronic neck pain    Chronic pain syndrome    Depression    secondary to loss of her son at age 71   Diabetes mellitus    Diabetic neuropathy ([place])    DMII (diabetes mellitus, type 2) ([place])    Enthesopathy of hip region    Fibromyalgia    Hep C w/o coma, chronic ([place])    Hepatitis C    diagnosed 2005   Hypertension    Insomnia    Insomnia    Low back pain    Lumbago    Mitral regurgitation    Obesity    Pain in joint, upper arm    Primary localized osteoarthrosis, lower leg    Substance abuse ([place])    sober since 2001   Tobacco abuse    Tricuspid regurgitation    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    Medications:  Scheduled:   aspirin EC  81 mg Oral Daily   atorvastatin  20 mg Oral Daily   baclofen  10 mg Oral BID   buPROPion  300 mg Oral q AM   Chlorhexidine Gluconate Cloth  6 each Topical Daily   colchicine  0.6 mg Oral BID   diltiazem  180 mg Oral Daily   estrogens (conjugated)  0.45 mg Oral Daily   gabapentin  600 mg Oral TID   insulin  aspart  0-15 Units Subcutaneous TID WC   metoprolol tartrate  50 mg Oral BID   oxyCODONE  15 mg Oral QID   pantoprazole  40 mg Oral Daily   sodium chloride flush  3 mL Intravenous Q12H   traZODone  200 mg Oral QHS   Warfarin - Pharmacist Dosing Inpatient   Does not apply q1600    Assessment: 61 yo F s/p recent MVR and TVR on 5/15 which was c/b postop Afib/flutter. Pt on warfarin PTA which was held on admit and reversed with [facility] 6/5 with need for urgent pericardiocentesis and drain placement. Pericardial drain removed 6/10, warfarin resumed 6/11.  INR today subtherapeutic at 1.1 as expected, CBC stable.   PTA warfarin regimen = 5 mg TTS; 2.5 mg AOD  Goal of Therapy:  INR 2-3 Monitor platelets by anticoagulation protocol: Yes   Plan:  Warfarin '5mg'$  PO x1 Daily INR  Arrie Senate, PharmD, BCPS, [facility] Clinical Pharmacist [phone] Please check AMION for all [facility] Pharmacy numbers 05/17/2022

## 2022-05-17 NOTE — Discharge Instructions (Signed)
Please keep appointments.  We added colchicine to medications to prevent chest pain from inflammation.  Your coumadin is 5 mg daily and we will check labs on Wed.  The coumadin clinic should call you any changes, if you have not heard from them by Wed. Afternoon call the office.  Ask to speak to coumadin clinic.    Heart Healthy diabetic diet  Stop smoking  Ambulate around the house.   Call if any issues

## 2022-05-17 NOTE — Telephone Encounter (Signed)
Patient has been scheduled for a TOC hospital follw-up on 05/26/22 at 10:15 AM with Nicholes Rough, PA, per Cecilie Kicks, NP request.

## 2022-05-17 NOTE — Discharge Summary (Addendum)
Discharge Summary    Patient ID: [name] MRN: [id number]; DOB: 04-11-1961  Admit date: 05/10/2022 Discharge date: 05/17/2022  PCP:  [facility] [place] [facility] Cardiologist:  Nicole Lean, MD        Discharge Diagnoses    Principal Problem:   Pericardial tamponade Active Problems:   Type 2 diabetes mellitus without complication, without long-term current use of insulin (HCC)   Chronic pain syndrome   Tobacco use   Anemia, blood loss   PAF (paroxysmal atrial fibrillation) (HCC)   Atrial flutter with rapid ventricular response (HCC)   Acute pericardial effusion s/p pericardiocentesis 05/10/22   Anticoagulated   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   Fluid was not overtly bloody.  I suspect the fluid was more related to postsurgical pericarditis.  Cannot start NSAIDs at this point given her acute renal failure.  Hopefully, her renal function will improve now that her hemodynamics are better.  As her creatinine improves, consider increasing treatment for pericarditis.  We will try to dose colchicine for her renal function.  We will leave the drain in overnight.  Effusion was essentially gone by echocardiogram postprocedure, after the fluid removal noted above.  Plan for limited echo tomorrow.  Remove drain as determined by fluid accumulation.   Discussed with Dr. Roxan [name].   Results conveyed to bother, [name], who is listed as the emergency contact.  He lives out of town but was going to inform the sister who lives here locally. __________    2D echo 05/13/2022 IMPRESSIONS    1. Moderate septal hypertrophy with otherwise mild LVH. Basal to mid  inferior and inferoseptal hypokinesis. Left ventricular  ejection fraction,  by estimation, is 45 to 50%. The left ventricle has mildly decreased  function. The left ventricle  demonstrates regional wall motion abnormalities (see scoring  diagram/findings for description). There is moderate left ventricular  hypertrophy.   2. Right ventricular systolic function is normal. The right ventricular  size is normal. There is mildly elevated pulmonary artery systolic  pressure.   3. Left atrial size was moderately dilated.   4. The mitral valve has been repaired/replaced. Trivial mitral valve  regurgitation. No evidence of mitral stenosis. There is a 30 mm prosthetic  annuloplasty ring present in the mitral position. Procedure Date: 04/27/22.   5. Pericardial effusion has resolved.   6. The tricuspid valve is has been repaired/replaced. The tricuspid valve  is status post repair with an annuloplasty ring. Tricuspid valve  regurgitation is mild.   7. The aortic valve is normal in structure. Aortic valve regurgitation is  not visualized. No aortic stenosis is present.   8. The inferior vena cava is normal in size with greater than 50%  respiratory variability, suggesting right atrial pressure of 3 mmHg.    2D echo 05/10/22 IMPRESSIONS    1. Right ventricle does not relax completely suggesting increase in  intrapericardial pressure.Subtle appreciation for ventricular  interdependence. Full study for tamponade physiology not performed: Tissue  doppler, MV/TV respiratory variation and IVC.  Moderate pericardial effusion. The pericardial effusion is  circumferential.   2. Left ventricular ejection fraction, by estimation, is 55 to 60%. The  left ventricle has normal function. Left ventricular diastolic function  could not be evaluated.  3. Right ventricular systolic function is normal.   4. The mitral valve is grossly normal.   5. The aortic valve was not well visualized.  History of Present Illness     [name] is a 61 y.o. 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   She had faint pulses and sBP of 82 without change with IVFs.  Bedside Echo c/w tamponade physiology.  She was taken emergently to cath lab for emergency pericardiocentesis and 450 cc of serosanguineous fluid was removed. Drain was placed.   She was admitted to ICU.    EKG on admit typical AFL with variable block HR 108 QRS 104 ms, Qtc 521 ms, no ischemic changes   Cr 2.10 K+ 4.1 Na 136 HS troponin 33  Hgb 10.3 WBC 8.2 plts 293 on admit  INR 2.1 on coumadin --coumadin held.   Hospital Course     Consultants: Dr. Roxan [name]    Post Pericardiocentesis pt admitted to 2 H for management and treatment.  Effusion likely postpericardiotomy.  Colchicine started.   Pericardial drain remained until 05/15/22.  Removed without complication.  Pt was to transfer to progressive bed but no beds available.  She has been ambulated in the hall.    AKI Cr improved after pericardiocentesis with Cr down to 1.10 on the 6th of June.   At discharge Cr 0.92  -- we held cozaar for now.    Atrial flutter rate controlled and plan at discharge if remains in flutter she will need DCCV once recovered from tamponade.  At discharge on DIlt and BB.   Anticoagulation on coumadin and INR at time of discharge 1.1 and plan to allow to drift up with blood in pericardial fluid.  Discharge on 5 mg daily and INR check on Wed.   Coumadin was resumed 05/15/22.  64m daily.  Anemia Hgb today 8.9 has slowly drifted down will recheck on Wed. As well.   HTN now on BB and dilt.   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 04/19/22  She has chronic pain and pain meds come from PCP.  She has been instructed to follow up with them.   Today pt has been seen and evaluated by Dr. AMargaretann Lovelessand found stable for discharge.  We will arrange home health RN for medication assistance and to draw labs on Wed.           Did the patient have an acute coronary syndrome (MI, NSTEMI, STEMI, etc) this admission?:  No                               Did the patient have a percutaneous coronary intervention (stent / angioplasty)?:  No.        The patient will be scheduled for a TOC follow up appointment in 9 days days.  [facility] at the office where the patient should be seen for follow up.  _____________  Discharge Vitals Blood pressure (!) 145/88, pulse 88,  temperature 99 F (37.2 C), temperature source Oral, resp. rate 18, height 5' 11"  (1.803 m), weight 95.2 kg, last menstrual period 12/06/1976, SpO2 96 %.  Filed Weights   05/11/22 0630 05/12/22 0645 05/17/22 0600  Weight: 99.5 kg 98.7 kg 95.2 kg   General:Pleasant affect, NAD Skin:Warm and dry, brisk capillary refill HEENT:normocephalic, sclera clear, mucus membranes moist Neck:supple, no JVD, no bruits  Heart:irreg irreg without murmur, gallup, rub or click Lungs:clear without rales, rhonchi, or wheezes HOZ:YYQM, non tender, + BS, do not palpate liver spleen or masses Ext:no lower ext edema, 2+ pedal pulses, 2+ radial pulses Neuro:alert and oriented, MAE, follows commands, + facial symmetry  Labs & Radiologic Studies    CBC Recent Labs    05/16/22 0850 05/17/22 0606  WBC 5.3 4.7  HGB 9.6* 8.9*  HCT 29.8* 27.4*  MCV 87.9 88.7  PLT 219 250   Basic Metabolic Panel Recent Labs    05/16/22 0850  NA 137  K 4.1  CL 105  CO2 24  GLUCOSE 122*  BUN 8  CREATININE 0.92  CALCIUM 8.1*   Liver Function Tests No results for input(s): "AST", "ALT", "ALKPHOS", "BILITOT",  "PROT", "ALBUMIN" in the last 72 hours. No results for input(s): "LIPASE", "AMYLASE" in the last 72 hours. High Sensitivity Troponin:   Recent Labs  Lab 05/02/22 1422 05/02/22 1700 05/10/22 0603  TROPONINIHS 68* 64* 33*    BNP Invalid input(s): "POCBNP" D-Dimer No results for input(s): "DDIMER" in the last 72 hours. Hemoglobin A1C No results for input(s): "HGBA1C" in the last 72 hours. Fasting Lipid Panel No results for input(s): "CHOL", "HDL", "LDLCALC", "TRIG", "CHOLHDL", "LDLDIRECT" in the last 72 hours. Thyroid Function Tests No results for input(s): "TSH", "T4TOTAL", "T3FREE", "THYROIDAB" in the last 72 hours.  Invalid input(s): "FREET3" _____________  DG CHEST PORT 1 VIEW  Result Date: 05/15/2022 CLINICAL DATA:  Chest pain. EXAM: PORTABLE CHEST 1 VIEW COMPARISON:  05/10/2022 and older studies.  CT, 12/18/2020. FINDINGS: Previous cardiac surgery and valve replacement, stable from the most recent prior exam, new compared to the 12/18/2020 CT. Cardiac silhouette normal in size. No mediastinal or hilar masses. Hazy opacity in the right lung base, new since prior chest radiograph. There are linear opacities in the right mid lung, also new. Mild linear opacity and peripheral pleural based opacity at the left lung base, also new. Remainder of the lungs is clear. Suspect small effusions, greater on the right. No pneumothorax. Skeletal structures are grossly intact. IMPRESSION: 1. New lung base opacities as detailed, greater on the right. Suspect small effusions and associated atelectasis. Consider pneumonia in the proper clinical setting. No evidence of pulmonary edema. Electronically Signed   By: Lajean Manes M.D.   On: 05/15/2022 12:30   ECHOCARDIOGRAM LIMITED  Result Date: 05/13/2022    ECHOCARDIOGRAM LIMITED REPORT   Patient Name:   [name] Date of Exam: 05/13/2022 Medical Rec #:  [id number]         Height:       71.0 in Accession #:    [id number]        Weight:       217.6 lb  Date of Birth:  14-Jun-1961         BSA:          2.185 m Patient Age:    48 years          BP:           147/128 mmHg Patient Gender: F  HR:           98 bpm. Exam Location:  Inpatient Procedure: Limited Echo, Color Doppler and Limited Color Doppler Indications:    Pericardial effusion  History:        Patient has prior history of Echocardiogram examinations, most                 recent 04/23/2022. Signs/Symptoms:Murmur; Risk Factors:Diabetes                 and Hypertension. 05/10/22 pericardiocentesis                 MV and TV repair 04/19/22.                  Mitral Valve: 30 mm prosthetic annuloplasty ring valve is                 present in the mitral position. Procedure Date: 04/27/22.  Sonographer:    Luisa Hart RDCS Referring Phys: [place]  1. Moderate septal hypertrophy with otherwise mild LVH. Basal to mid inferior and inferoseptal hypokinesis. Left ventricular ejection fraction, by estimation, is 45 to 50%. The left ventricle has mildly decreased function. The left ventricle demonstrates regional wall motion abnormalities (see scoring diagram/findings for description). There is moderate left ventricular hypertrophy.  2. Right ventricular systolic function is normal. The right ventricular size is normal. There is mildly elevated pulmonary artery systolic pressure.  3. Left atrial size was moderately dilated.  4. The mitral valve has been repaired/replaced. Trivial mitral valve regurgitation. No evidence of mitral stenosis. There is a 30 mm prosthetic annuloplasty ring present in the mitral position. Procedure Date: 04/27/22.  5. Pericardial effusion has resolved.  6. The tricuspid valve is has been repaired/replaced. 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 Tricuspid valve regurgitation is mild.  7. The aortic valve is normal in structure. Aortic valve regurgitation is not visualized. No aortic stenosis is present.  8. The inferior vena cava is  normal in size with greater than 50% respiratory variability, suggesting right atrial pressure of 3 mmHg. FINDINGS  Left Ventricle: Moderate septal hypertrophy with otherwise mild LVH. Basal to mid inferior and inferoseptal hypokinesis. Left ventricular ejection fraction, by estimation, is 45 to 50%. The left ventricle has mildly decreased function. The left ventricle demonstrates regional wall motion abnormalities. The left ventricular internal cavity size was normal in size. There is moderate left ventricular hypertrophy. Right Ventricle: The right ventricular size is normal. No increase in right ventricular wall thickness. Right ventricular systolic function is normal. There is mildly elevated pulmonary artery systolic pressure. The tricuspid regurgitant velocity is 2.73  m/s, and with an assumed right atrial pressure of 15 mmHg, the estimated right ventricular systolic pressure is 84.5 mmHg. Left Atrium: Left atrial size was moderately dilated. Right Atrium: Right atrial size was normal in size. Pericardium: Pericardial effusion has resolved. There is no evidence of pericardial effusion. There is excessive respiratory variation in the tricuspid valve spectral Doppler velocities. Mitral Valve: The mitral valve has been repaired/replaced. Trivial mitral valve regurgitation. There is a 30 mm prosthetic annuloplasty ring present in the mitral position. Procedure Date: 04/27/22. No evidence of mitral valve stenosis. The mean mitral valve gradient is 6.4 mmHg with average heart rate of 76 bpm. Tricuspid Valve: The tricuspid valve is has been repaired/replaced. Tricuspid valve regurgitation is mild . No evidence of tricuspid stenosis. The tricuspid valve is status post repair with an  annuloplasty ring. Aortic Valve: The aortic valve is normal in structure. Aortic valve regurgitation is not visualized. No aortic stenosis is present. Aortic valve mean gradient measures 2.0 mmHg. Aortic valve peak gradient measures 3.9  mmHg. Pulmonic Valve: The pulmonic valve was normal in structure. Pulmonic valve regurgitation is not visualized. No evidence of pulmonic stenosis. Aorta: The aortic root is normal in size and structure. Venous: The inferior vena cava is normal in size with greater than 50% respiratory variability, suggesting right atrial pressure of 3 mmHg. IAS/Shunts: No atrial level shunt detected by color flow Doppler. LEFT VENTRICLE PLAX 2D LVIDd:         4.70 cm LVIDs:         3.70 cm LV PW:         1.10 cm LV IVS:        1.50 cm  LEFT ATRIUM           Index LA Vol (A4C): 75.2 ml 34.41 ml/m  AORTIC VALVE                   PULMONIC VALVE AV Vmax:           98.65 cm/s  PV Vmax:       0.72 m/s AV Vmean:          66.650 cm/s PV Vmean:      46.450 cm/s AV VTI:            0.150 m     PV VTI:        0.118 m AV Peak Grad:      3.9 mmHg    PV Peak grad:  2.1 mmHg AV Mean Grad:      2.0 mmHg    PV Mean grad:  1.0 mmHg LVOT Vmax:         65.80 cm/s LVOT Vmean:        40.100 cm/s LVOT VTI:          0.107 m LVOT/AV VTI ratio: 0.71  AORTA Ao Asc diam: 3.50 cm MITRAL VALVE                TRICUSPID VALVE MV Mean grad: 6.4 mmHg      TR Peak grad:   29.8 mmHg MR Peak grad: 77.8 mmHg     TR Vmax:        273.00 cm/s MR Mean grad: 57.0 mmHg MR Vmax:      441.00 cm/s   SHUNTS MR Vmean:     368.0 cm/s    Systemic VTI: 0.11 m MV E velocity: 190.40 cm/s Skeet Latch MD Electronically signed by Skeet Latch MD Signature Date/Time: 05/13/2022/3:03:20 PM    Final    ECHOCARDIOGRAM LIMITED  Result Date: 05/13/2022    ECHOCARDIOGRAM LIMITED REPORT   Patient Name:   [name] Date of Exam: 05/10/2022 Medical Rec #:  [id number]         Height:       71.0 in Accession #:    [id number]        Weight:       212.0 lb Date of Birth:  October 11, 1961         BSA:          2.161 m Patient Age:    48 years          BP:           134/70 mmHg Patient Gender: F  HR:           86 bpm. Exam Location:  Inpatient Procedure: Limited Echo                       STAT ECHO Reported to: Dr Berniece Salines on 05/10/2022 8:27:00 AM.                                 MODIFIED REPORT: This report was modified by Berniece Salines DO on 05/13/2022 due to adjust report for                                   MV accuracy.  Indications:     Pericardiocentesis  History:         Patient has prior history of Echocardiogram examinations, most                  recent 04/19/2022. Arrythmias:Atrial Fibrillation; Risk                  Factors:Former Smoker.  Sonographer:     Clayton Lefort RDCS (AE) Referring Phys:  [place] Diagnosing Phys: Godfrey Pick Tobb DO IMPRESSIONS  1. Right ventricle does not relax completely suggesting increase in intrapericardial pressure.Subtle appreciation for ventricular interdependence. Full study for tamponade physiology not performed: Tissue doppler, MV/TV respiratory variation and IVC. Moderate pericardial effusion. The pericardial effusion is circumferential.  2. Left ventricular ejection fraction, by estimation, is 55 to 60%. The left ventricle has normal function. Left ventricular diastolic function could not be evaluated.  3. Right ventricular systolic function is normal.  4. The mitral valve has been repaired/replaced.  5. The aortic valve was not well visualized. FINDINGS  Left Ventricle: Left ventricular ejection fraction, by estimation, is 55 to 60%. The left ventricle has normal function. Left ventricular diastolic function could not be evaluated. Right Ventricle: Right ventricular systolic function is normal. Left Atrium: Left atrial size was not assessed. Right Atrium: Right atrial size was not assessed. Pericardium: Right ventricle does not relax completely suggesting increase in intrapericardial pressure.Subtle appreciation for ventricular interdependence. Full study for tamponade physiology not performed: Tissue doppler, MV/TV respiratory variation and IVC. A moderately sized pericardial effusion is present. The pericardial effusion is  circumferential. Mitral Valve: The mitral valve has been repaired/replaced. Tricuspid Valve: The tricuspid valve is grossly normal. Aortic Valve: The aortic valve was not well visualized. Pulmonic Valve: The pulmonic valve was not well visualized. Aorta: The ascending aorta was not well visualized and the aortic root was not well visualized. Venous: The inferior vena cava was not well visualized. Berniece Salines DO Electronically signed by Berniece Salines DO Signature Date/Time: 05/10/2022/10:43:00 AM    Final (Updated)    CARDIAC CATHETERIZATION  Result Date: 05/10/2022   Successful pericardiocentesis with removal of 450 cc of serosanguineous fluid. Fluid was not overtly bloody.  I suspect the fluid was more related to postsurgical pericarditis.  Cannot start NSAIDs at this point given her acute renal failure.  Hopefully, her renal function will improve now that her hemodynamics are better.  As her creatinine improves, consider increasing treatment for pericarditis.  We will try to dose colchicine for her renal function.  We will leave the drain in overnight.  Effusion was essentially gone by echocardiogram postprocedure, after the fluid removal noted above.  Plan for limited echo tomorrow.  Remove drain as determined by fluid accumulation. Discussed with Dr. Roxan [name]. Results conveyed to bother, [name], who is listed as the emergency contact.  He lives out of town but was going to inform the sister who lives here locally.   DG Chest Port 1 View  Result Date: 05/10/2022 CLINICAL DATA:  61 year old female with chest pain. EXAM: PORTABLE CHEST 1 VIEW COMPARISON:  Portable chest 05/02/2022 and earlier. FINDINGS: Portable AP semi upright view at 0610 hours. Stable cardiomegaly and mediastinal contours. Chronic prosthetic cardiac valves. Prior sternotomy. Visualized tracheal air column is within normal limits. Allowing for portable technique the lungs are clear. No pneumothorax. No acute osseous abnormality identified.  IMPRESSION: No acute cardiopulmonary abnormality. Electronically Signed   By: Genevie Ann M.D.   On: 05/10/2022 06:27   DG Chest Port 1 View  Result Date: 05/02/2022 CLINICAL DATA:  Upper chest pain, shortness of breath EXAM: PORTABLE CHEST 1 VIEW COMPARISON:  04/22/2022 FINDINGS: Single frontal view of the chest demonstrates stable enlargement the cardiac silhouette. Postsurgical changes are seen from median sternotomy and mitral and tricuspid valve replacements. No acute airspace disease, effusion, or pneumothorax. No acute bony abnormalities. IMPRESSION: 1. No acute intrathoracic process. Electronically Signed   By: Randa Ngo M.D.   On: 05/02/2022 14:59   DG Chest 2 View  Result Date: 04/22/2022 CLINICAL DATA:  Postoperative check. EXAM: CHEST - 2 VIEW COMPARISON:  Portable chest yesterday at 5:25 a.m. FINDINGS: 4:28 a.m., 04/22/2022. The heart is enlarged. There is a stable mediastinal configuration. There are midline sternotomy sutures and prosthetic mitral and tricuspid valves again shown. Interval removal of right IJ catheter introducer sheath. No pneumothorax is seen. Small pleural effusions are again noted. The central vessels remain normal in caliber. There is consolidation or atelectasis in the base of both lungs, unchanged. The mid and upper lungs remain clear. Overall aeration is unchanged. IMPRESSION: 1. Persistent basilar atelectasis or consolidation, greater on the left and small underlying pleural effusions. Overall aeration is unchanged. 2. Interval removal right IJ catheter introducer sheath. Electronically Signed   By: Telford Nab M.D.   On: 04/22/2022 05:46   DG Chest Port 1 View  Result Date: 04/21/2022 CLINICAL DATA:  Reason for exam: S/P MVR Patient denies any sob or chest pains. Hx of diabetes, htn. Hx of mitral and tricuspid valves repair 04/19/2022. EXAM: PORTABLE CHEST - 1 VIEW COMPARISON:  the previous day's study FINDINGS: Swan-Ganz catheter has been retracted leaving the  IJ venous sheath in place. Removal of right chest drain. No pneumothorax. Relatively low lung volumes with left basilar consolidation/atelectasis slightly improved since previous. Mild opacities at the right lung base however have slightly increased. Heart size upper limits normal. The AP window is obscured as before. Blunting of lateral costophrenic angles suggesting small effusions. Sternotomy wires. IMPRESSION: Persistent low lung volumes with bibasilar opacities, left greater than right. Electronically Signed   By: Lucrezia Europe M.D.   On: 04/21/2022 06:43   DG Chest Port 1 View  Result Date: 04/20/2022 CLINICAL DATA:  Mitral valve and tricuspid valve replacements yesterday. EXAM: PORTABLE CHEST 1 VIEW COMPARISON:  Portable chest yesterday at 3:02 p.m. FINDINGS: 5:27 a.m., 04/20/2022. Interval extubation and removal NGT. Two mediastinal drains remain in place. Prosthetic mitral and tricuspid valves are again noted. There are intact median sternotomy sutures. There is mild cardiomegaly. Stable mediastinum with mild aortic atherosclerosis. Right IJ Swan-Ganz catheter tip remains in the pulmonary outflow tract. The central vessels are normal in caliber. There is persistent  consolidation or atelectasis in the left lower lobe and small left effusion. Remainder of the lungs are clear. There is no visible pneumothorax. Overall aeration is unchanged. IMPRESSION: 1. Interval extubation, removal NGT. 2. Stable atelectasis or consolidation left lower lobe with small left pleural effusion. Electronically Signed   By: Telford Nab M.D.   On: 04/20/2022 07:37   DG Chest Port 1 View  Result Date: 04/19/2022 CLINICAL DATA:  Mitral regurgitation.  Postop EXAM: PORTABLE CHEST 1 VIEW COMPARISON:  02/13/2022 FINDINGS: Endotracheal tube, NG tube, Swan-Ganz catheter, and LEFT chest tube in appropriate position. Mild venous congestion pattern. LEFT basilar atelectasis. No pneumothorax. IMPRESSION: 1. Support apparatus appears  in good position. 2. No pneumothorax. 3. Central venous congestion and  LEFT lower lobe atelectasis. Electronically Signed   By: Suzy Bouchard M.D.   On: 04/19/2022 15:15   ECHO INTRAOPERATIVE TEE  Result Date: 04/19/2022  *INTRAOPERATIVE TRANSESOPHAGEAL REPORT *  Patient Name:   [name] Date of Exam: 04/19/2022 Medical Rec #:  [id number]         Height:       71.0 in Accession #:    [id number]        Weight:       212.0 lb Date of Birth:  Jul 13, 1961         BSA:          2.16 m Patient Age:    42 years          BP:           148/83 mmHg Patient Gender: F                 HR:           66 bpm. Exam Location:  Inpatient Transesophogeal exam was perform intraoperatively during surgical procedure. Patient was closely monitored under general anesthesia during the entirety of examination. Indications:     Mitral regurgitation Performing Phys: Renold Don MD Diagnosing Phys: Renold Don MD Complications: No known complications during this procedure. POST-OP IMPRESSIONS _ Left Ventricle: The left ventricle is unchanged from pre-bypass. _ Right Ventricle: The right ventricle appears unchanged from pre-bypass. _ Aorta: The aorta appears unchanged from pre-bypass. _ Left Atrium: The left atrium appears unchanged from pre-bypass. _ Left Atrial Appendage: The left atrial appendage appears unchanged from pre-bypass. _ Aortic Valve: The aortic valve appears unchanged from pre-bypass. _ Mitral Valve: There is trace regurgitation. The gradient recorded across the prosthetic valve is within the expected range, measuring 3 cm/s. The mitral valve is status post repair with an annuloplasty ring. _ Tricuspid Valve: There is mild-moderate regurgitation. _ Pulmonic Valve: The pulmonic valve appears unchanged from pre-bypass. _ Interatrial Septum: The interatrial septum appears unchanged from pre-bypass. _ Interventricular Septum: The interventricular septum appears unchanged from pre-bypass. _ Pericardium: The pericardium  appears unchanged from pre-bypass. PRE-OP FINDINGS  Left Ventricle: The left ventricle has normal systolic function, with an ejection fraction of 60-65%. The cavity size was normal. There is mild concentric left ventricular hypertrophy. Right Ventricle: The right ventricle has normal systolic function. The cavity was normal. There is no increase in right ventricular wall thickness. Left Atrium: Left atrial size was dilated. The left atrial appendage is well visualized and there is no evidence of thrombus present. Right Atrium: Right atrial size was normal in size. Interatrial Septum: No atrial level shunt detected by color flow Doppler. Pericardium: There is no evidence of pericardial effusion. Mitral Valve: The mitral valve is rheumatic. Mitral valve  regurgitation is severe by color flow Doppler. The MR jet is posteriorly-directed. There is No evidence of mitral stenosis. 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 Tricuspid valve regurgitation moderate-severe. No evidence of tricuspid stenosis is present. 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 Aortic Valve: The aortic valve is tricuspid Aortic valve regurgitation was not visualized by color flow Doppler. There is no stenosis of the aortic valve. Pulmonic Valve: The pulmonic valve was not assessed. Pulmonic valve regurgitation was not assessed by color flow Doppler. Aorta: The aortic root, ascending aorta and aortic arch are normal in size and structure.  Renold Don MD Electronically signed by Renold Don MD Signature Date/Time: 04/19/2022/12:55:51 PM    Final     Disposition   Pt is being discharged home today in good condition.  Follow-up Plans & Appointments   Please keep appointments.  We added colchicine to medications to prevent chest pain from inflammation.  Your coumadin is 5 mg daily and we will check labs on Wed.  The coumadin clinic should call you any changes, if you have not heard from them by Wed.  Afternoon call the office.  Ask to speak to coumadin clinic.    Heart Healthy diabetic diet  Stop smoking  Ambulate around the house.   Call if any issues    Follow-up Information     Elgie Collard, PA-C Follow up on 05/26/2022.   Specialties: Physician Assistant, Cardiothoracic Surgery Why: at 1015 AM - this is one of Dr. Oralia Rud PA's  PLEASE NOTE ADDRESS of APPT. Contact information: [place] [address] [phone]         [facility] Follow up.   Specialty: Home Health Services Why: Registered Nurse: office to call with visit times. Contact information: [address] [place] [address] [phone]         [facility] [place] Follow up on 05/20/2022.   Specialty: Cardiothoracic Surgery Why: at 2:00pm Contact information: [place], [place] [phone]        [facility] Follow up on 05/31/2022.   Specialty: Cardiology Why: at 9:15 AM for relook at heart with ultrasound Contact information: [address], [place], [place] an appointment as soon as possible for a visit.   Contact information: [place] [address] [phone]         Nicole Lean, MD .   Specialty: Cardiology Contact information: [place]  [address] [phone]                  Discharge Medications   Allergies as of 05/17/2022       Reactions   Hydrocodone-acetaminophen Itching   Hydrocodone Itching   Tolerates oxycodone         Medication List     STOP taking these medications    baclofen 10 MG tablet Commonly known as: LIORESAL   hydrOXYzine 25 MG tablet Commonly known as: ATARAX   losartan 50 MG tablet Commonly known as: COZAAR        TAKE these medications    Accu-Chek FastClix Lancets Misc 1 each by Does not apply route daily. Check blood sugar once daily. 250.00   acetaminophen 500 MG tablet Commonly known as: TYLENOL Take 1,000 mg by mouth every  6 (six) hours as needed for mild pain.   albuterol 108 (90 Base) MCG/ACT inhaler Commonly known as: VENTOLIN HFA Inhale 2 puffs into the lungs every 6 (six) hours as needed for wheezing or shortness of breath.   aspirin EC 81 MG tablet Take 81 mg by mouth daily.   atorvastatin 20 MG tablet Commonly known as: LIPITOR Take 20 mg by mouth daily.   buPROPion 300 MG 24 hr tablet Commonly known as: WELLBUTRIN XL Take 300 mg by mouth in the morning.   colchicine 0.6 MG tablet Take 1 tablet (0.6 mg total) by mouth daily.   dexlansoprazole 60 MG capsule Commonly known as: DEXILANT Take 60 mg by mouth daily.   diltiazem 180 MG 24 hr capsule Commonly known as: CARDIZEM CD Take 1 capsule (180 mg total) by mouth daily. Start taking on: May 18, 2022 What changed:  medication strength how much to take   ferrous [id number] C-folic acid capsule Commonly known as: TRINSICON / FOLTRIN Take 1 capsule by mouth 2 (two) times daily after a meal.   FLUoxetine 20 MG capsule Commonly known as: PROZAC Take 20 mg by mouth every morning.   gabapentin 600 MG tablet Commonly known as: NEURONTIN Take 600 mg by mouth 3 (three) times daily.   glucose blood test strip Commonly known as: Accu-Chek SmartView Check blood sugar once daily. 250.00   linagliptin 5 MG Tabs tablet Commonly known as: TRADJENTA Take 5 mg by mouth in the morning.   methocarbamol 500 MG tablet Commonly known as: ROBAXIN Take 500 mg by mouth 4 (four) times daily.   metoprolol tartrate 50 MG tablet Commonly known as: LOPRESSOR Take 1 tablet (50 mg total) by mouth 2 (two) times daily. What changed:  medication strength how much to take   naloxone 4 MG/0.1ML Liqd nasal spray  kit Commonly known as: NARCAN Place 1 spray into the nose once.   nystatin ointment Commonly known as: MYCOSTATIN Apply topically 2 (two) times daily.   oxyCODONE 15 MG immediate release tablet Commonly known as: ROXICODONE Take 15 mg by mouth every 4 (four) hours as needed for pain.   Premarin 0.45 MG tablet Generic drug: estrogens (conjugated) Take 0.45 mg by mouth daily.   traZODone 100 MG tablet Commonly known as: DESYREL Take 2.5 tablets (250 mg total) by mouth at bedtime as needed for sleep. What changed:  how much to take when to take this   VITAMIN B 12 PO Take 500 mcg by mouth daily.   warfarin 5 MG tablet Commonly known as: COUMADIN Take 1 tablet (5 mg total) by mouth daily. What changed:  when to take this Another medication with the same name was removed. Continue taking this medication, and follow the directions you see here.           Outstanding Labs/Studies   Labs ordered for 05/19/22  CBC, INR and BMP  Duration of Discharge Encounter   Greater than 30 minutes including physician time.  Signed, Cecilie Kicks, NP 05/17/2022, 12:11 PM  Patient seen and examined with Cecilie Kicks NP.  Agree as above, with the following exceptions and changes as noted below. Feels well, no CP or SOB. Gen: NAD, CV: iRRR, no murmurs, Lungs: clear, Abd: soft, Extrem: Warm, well perfused, no edema, Neuro/Psych: alert and oriented x 3, normal mood and affect. All available labs, radiology testing, previous records reviewed. Stable for hospital discharge, close follow up and labs have been arranged, echo in 2 weeks for 1 mo post  valve and recheck effusion. Pt agreeable to discharging home.   Elouise Munroe, MD 05/17/22 1:05 PM

## 2022-05-18 ENCOUNTER — Telehealth: Payer: Self-pay | Admitting: Internal Medicine

## 2022-05-18 ENCOUNTER — Telehealth: Payer: Self-pay

## 2022-05-18 ENCOUNTER — Other Ambulatory Visit: Payer: Self-pay

## 2022-05-18 ENCOUNTER — Ambulatory Visit: Payer: Medicare Other

## 2022-05-18 MED ORDER — DILTIAZEM HCL ER COATED BEADS 300 MG PO CP24: 300.0000 mg | ORAL_CAPSULE | Freq: Every day | ORAL | 6 refills | Status: DC

## 2022-05-18 NOTE — Telephone Encounter (Signed)
  [facility] calling, pt is there right now and she is in Afib. Pt is denied and significant symptom but they are no clue what to prescribe her sine pt doesn't have any clue what she is taking other than metoprolol and coumadin, they need an advised before sending the pt home.

## 2022-05-18 NOTE — Telephone Encounter (Signed)
-----   Message from [name], NP sent at 05/17/2022 11:43 AM EDT ----- [facility] appt on the 21st.

## 2022-05-18 NOTE — Telephone Encounter (Signed)
See previous note. Dr. Johnsie Cancel recommend increasing cardiazem 300 mg daily and to keep follow up appointment next week.

## 2022-05-18 NOTE — Telephone Encounter (Signed)
**Note De-Identified  Obfuscation** Patient contacted regarding discharge from [facility] on 05/17/2022.  Patient understands to follow up with provider Nicholes Rough, PA-c on 05/26/2022 at 10:15 at [address] [place], [place] [address].  Patient understands discharge instructions? Yes  Patient understands medications and regiment? Yes, but states that she does not like any changes being made with her medications and that there are too many MDs changing her medications around. We went over her hospital discharge medication list together and I explained what each new med/dosage change is for and the importance of taking her medications as directed. She verbalized understanding and verified that she does currently have all her medications.  Patient understands to bring all medications to this visit? Yes  Ask patient:  Are you enrolled in My Chart: Yes    Postop Surgical Patients:                What is your wound status: Per the pt "it looks good and has not bothered me".    Any signs/ symptoms of infection (Temp, redness/ red streaks, swelling, purulent drainage, foul odor or smell)? Per the pt, No.                Please do not place any creams/ lotions/ or antibiotic ointment on any surgical incisions/ wounds without physician approval. The pt is aware.               Do you have any questions about your medications?  All of her questions were answered. All medications (except pain medications) are to be filled by your Cardiologist AFTER your first post op appointment with them.  The pt is aware.  Are you taking your pain medication? Yes, she states that she is almost out of her pain meds but has enough to last until her f/u app at [facility] on 05/20/2022.               How is your pain controlled? Per the pt "Well". Pain level?  On a scale of 1 to 10 with 1 being the least amount of pain and 10 being the worst she rates her pain at 6 (she reports that she took a Oxycodone today at 1 pm which was about a half  hour prior to this call).               If you require a refill on pain medications, know that the same medication/ amount may not be prescribed or a refill may not be given. Please contact your pharmacy for refill requests. The pt verbalized understanding.               Do you have help at home with ADL's?  No. If you have home health, have you been contacted or seen by the agency? She states that someone is coming to her home to assist her with her medications as she has a hard time keeping them straight and to help her around her house. She reports that she was seen by [facility] today who arranged this assistance for her.               Please refer to your Pre/post surgery booklet, there is a lot of useful information in it that may answer any questions you may have. She states she has both of them and does refer to them when she had any questions.               Please note that it **Note De-Identified  Obfuscation** is ok to remove your surgical dressing, shower (soap/ water), and pat the incision dry. The pt reports that the nurse at the hospital removed her bandage before her discharge yesterday and that she showered today without any issues.  For surgery related questions staff will route a phone note to CV DIV [facility] TOC pool  [facility] [place], [place] [address]  The pt reports that other than the pain at her wound site she feels ok and denies CP/discomfort, SOB, dizziness, lightheadedness, nausea, or diaphoresis.  She does have [facility]'s office phone number to call if she has any questions or concerns. She thanked me for my call and patients with her as this was a long call.

## 2022-05-18 NOTE — Progress Notes (Signed)
Orie Fisherman PA's office called about patient they are seeing now. Spoke with Levada Dy CMA. She stated patient was in Atrial Fib with a HR 132, and asymptomatic. Patient is currently taking Metoprolol 50 mg BID, diltiazem 180 mg, and coumadin 5 mg daily. They are seeking advisement. Dr. Johnsie Cancel advised increasing diltiazem to 300 mg daily and keep her follow up appointment next week. Levada Dy verbalized understanding. Our office will place order for diltiazem.

## 2022-05-19 ENCOUNTER — Telehealth: Payer: Self-pay | Admitting: *Deleted

## 2022-05-19 NOTE — Telephone Encounter (Signed)
[facility] regarding the pt order for an INR that we received a message stating that:  Maccia, Melissa D, RPH-CPP  P Cv Div Ch St Anticoag; P Cv Div Nl Anticoag FYI-  I canceled apt- but heads up that [facility] RN will be calling        Previous Messages  ----- Message -----  From: [name], NP  Sent: 05/17/2022   3:35 PM EDT  To: Leeroy Bock, RPH-CPP   I made appt for pt in coumadin clinic for Wed the 14th but instead [facility] to draw and send to coumadin clinic at [facility]  so you can cancel please   Spoke with Burman Nieves the Clinical manager regarding INR and she stated today was the start of care day and there is an order for it to be done. She stated she will have the nurse, Gemma Payor call us directly. Will await call back.   Called pt and she stated they came out on Monday and there were 2 different agencies and that they are supposed to come back the end of the week. She stated she is taking the coumadin/warfarin. She is aware this is a monitored medication and if the nurse does not obtain the level than she will have to come into the office.

## 2022-05-20 ENCOUNTER — Encounter: Payer: Self-pay | Admitting: Physician Assistant

## 2022-05-20 ENCOUNTER — Ambulatory Visit
Admission: RE | Admit: 2022-05-20 | Discharge: 2022-05-20 | Disposition: A | Discharge status: Home / Self Care | Payer: Medicare Other | Source: Ambulatory Visit | Attending: Thoracic Surgery (Cardiothoracic Vascular Surgery) | Admitting: Thoracic Surgery (Cardiothoracic Vascular Surgery)

## 2022-05-20 ENCOUNTER — Ambulatory Visit (INDEPENDENT_AMBULATORY_CARE_PROVIDER_SITE_OTHER): Payer: Self-pay | Admitting: Physician Assistant

## 2022-05-20 ENCOUNTER — Telehealth: Payer: Self-pay | Admitting: Internal Medicine

## 2022-05-20 VITALS — BP 85/64 | HR 63 | Resp 20 | Ht 71.0 in | Wt 213.0 lb

## 2022-05-20 DIAGNOSIS — I3139 Other pericardial effusion (noninflammatory): Secondary | ICD-10-CM

## 2022-05-20 DIAGNOSIS — Z9889 Other specified postprocedural states: Secondary | ICD-10-CM

## 2022-05-20 DIAGNOSIS — I071 Rheumatic tricuspid insufficiency: Secondary | ICD-10-CM

## 2022-05-20 DIAGNOSIS — I340 Nonrheumatic mitral (valve) insufficiency: Secondary | ICD-10-CM

## 2022-05-20 DIAGNOSIS — I9789 Other postprocedural complications and disorders of the circulatory system, not elsewhere classified: Secondary | ICD-10-CM

## 2022-05-20 NOTE — Progress Notes (Signed)
[address]       [place] [address]             [phone]       HPI: [name] is a 61 year old female with a history of type 2 diabetes mellitus, hepatitis C, depression, tobacco use, chronic pain syndrome, and severe mitral insufficiency and tricuspid insufficiency.  She is status post mitral mitral valve and tricuspid valve repair on 04/19/2022 by Dr. Roxan Hockey.  Her postoperative course was notable for atrial flutter and fibrillation.  She was discharged on Coumadin anticoagulation. On 05/10/2022, she returned to the [facility] emergency room by way of [facility] with primary complaint of dizziness and chest pain that started on the morning of the day of admission.  When she arrived to the emergency room, she was hypotensive with a systolic blood pressure of 82.  Bedside echo showed a large pericardial effusion with tamponade physiology.  Both cardiology and CT surgery were consulted.  Cardiology team was able to take her to the Cath Lab emergently for pericardiocentesis.  Her hemodynamics improved immediately.  She was admitted to the ICU where she remained for the next few days.  She had mild renal insufficiency on admission that gradually improved to baseline renal function.  She remained in atrial fibrillation or atrial flutter with controlled ventricular response.  After the pericardial drainage subsided and the drain was removed, she was started back on Coumadin.  Last INR in the hospital on 05/17/2022 was 1.1.  [name] returns today for scheduled follow-up.  She says she feels pretty good overall except for her chronic pain issues.  She does report feeling lightheaded for the past several days.  She has not had any further shortness of breath since the pericardiocentesis.  She denies having any palpitations.   Current Outpatient Medications  Medication Sig Dispense Refill   ACCU-CHEK FASTCLIX LANCETS MISC 1 each by Does not apply route  daily. Check blood sugar once daily. 250.00 102 each 4   acetaminophen (TYLENOL) 500 MG tablet Take 1,000 mg by mouth every 6 (six) hours as needed for mild pain.     albuterol (PROVENTIL HFA;VENTOLIN HFA) 108 (90 Base) MCG/ACT inhaler Inhale 2 puffs into the lungs every 6 (six) hours as needed for wheezing or shortness of breath.      aspirin 81 MG EC tablet Take 81 mg by mouth daily.     atorvastatin (LIPITOR) 20 MG tablet Take 20 mg by mouth daily.     buPROPion (WELLBUTRIN XL) 300 MG 24 hr tablet Take 300 mg by mouth in the morning.     colchicine 0.6 MG tablet Take 1 tablet (0.6 mg total) by mouth daily. 60 tablet 2   Cyanocobalamin (VITAMIN B 12 PO) Take 500 mcg by mouth daily.     dexlansoprazole (DEXILANT) 60 MG capsule Take 60 mg by mouth daily.     diltiazem (CARDIZEM CD) 300 MG 24 hr capsule Take 1 capsule (300 mg total) by mouth daily. 30 capsule 6   ferrous [id number] C-folic acid (TRINSICON / FOLTRIN) capsule Take 1 capsule by mouth 2 (two) times daily after a meal. (Patient not taking: Reported on 05/11/2022)     FLUoxetine (PROZAC) 20 MG capsule Take 20 mg by mouth every morning.     gabapentin (NEURONTIN) 600 MG tablet Take 600 mg by mouth 3 (three) times daily.     glucose blood (ACCU-CHEK SMARTVIEW) test strip Check blood sugar once  daily. 250.00 50 each 12   linagliptin (TRADJENTA) 5 MG TABS tablet Take 5 mg by mouth in the morning.     methocarbamol (ROBAXIN) 500 MG tablet Take 500 mg by mouth 4 (four) times daily.     metoprolol tartrate (LOPRESSOR) 50 MG tablet Take 1 tablet (50 mg total) by mouth 2 (two) times daily. 60 tablet 6   naloxone (NARCAN) nasal spray 4 mg/0.1 mL Place 1 spray into the nose once.     nystatin ointment (MYCOSTATIN) Apply topically 2 (two) times daily.     oxyCODONE (ROXICODONE) 15 MG immediate release tablet Take 15 mg by mouth every 4 (four) hours as needed for pain. (Patient not taking: Reported on 05/11/2022)     PREMARIN 0.45 MG tablet  Take 0.45 mg by mouth daily.     traZODone (DESYREL) 100 MG tablet Take 2.5 tablets (250 mg total) by mouth at bedtime as needed for sleep. (Patient taking differently: Take 200 mg by mouth at bedtime.) 225 tablet 0   warfarin (COUMADIN) 5 MG tablet Take 1 tablet (5 mg total) by mouth daily.     No current facility-administered medications for this visit.    Physical Exam: Vital signs BP 84/54 Pulse 60 Respirations 20 SPO2 92% on room air  General: [name] walked in using a rolling walker today she has a standard kitchen trash bag with her that is over half filled with all the medicines that she is currently taking in various pill packs and prescription bottles.  Heart: Irregularly irregular rhythm.  Heart rate is 70 to 80/min.  There are no murmurs.  Chest: Breath sounds are clear to auscultation bilaterally.  Sternotomy incision and chest tube sites are healing with no sign of complication. The chest x-ray was reviewed and shows resolution of the cardiomegaly.  She has very small residual pleural effusions.  Fields are otherwise clear.  Extremities: There is no peripheral edema.   Diagnostic Tests: CLINICAL DATA:  Status post mitral valve surgery   EXAM: CHEST - 2 VIEW   COMPARISON:  Chest x-ray dated May 15, 2022   FINDINGS: Cardiac and mediastinal contours are within normal limits. Status post median sternotomy and valve replacement. Linear opacity of of the left upper lobe, likely due to atelectasis. Small right-greater-than-left pleural effusions. No focal consolidation. No evidence of pneumothorax.   IMPRESSION: Small right-greater-than-left pleural effusions.     Electronically Signed   By: Yetta Glassman M.D.   On: 05/20/2022 14:22  Impression / Plan: [name] is now 1 month post mitral valve and tricuspid valve repair for severe MR and TR.  She is stable hemodynamically.  She was readmitted to the hospital last week urgently for a large  pericardial effusion with pericardial tamponade.  This was managed with pericardiocentesis by cardiology.  She has had no recurrent symptoms of shortness of breath or heart failure.  She had acute renal insufficiency that resolved prior to her discharge after drainage of the pericardial effusion.  She was noted to be in atrial flutter while in the hospital and was asked to continue taking Coumadin and diltiazem CD 180 mg daily.  Cardiology is considering DC cardioversion at some point.  She tells me she does not currently have a primary care physician although she is taking several chronic medications for her diagnoses of chronic pain, type 2 diabetes mellitus, hypertension, anxiety, and depression she says she has also been seen by a pain clinic in [facility] but she has relocated to  [place] and does not want to return to that clinic.  She asked for prescription for oxycodone 15 mg which she had been taking every 4 hours.  I explained that it was not safe from multiple providers to provide prescription medications for her chronic pain management.   She is hypotensive during today's office visit and I think this explains her dizziness and lightheadedness.  She needs her diltiazem and metoprolol for rate control so I asked her to stop taking her losartan for now.  [name] said she was notified home health nurse will be coming to her home tomorrow to assist her with organizing her medications.   I do not see any evidence that she has had the INR since she left the hospital.  I explained to her the importance of taking the Coumadin as prescribed since she remains in atrial fibrillation.  We will arrange for PT/INR in 1 week.  She also has scheduled follow-up with cardiology in 1 week and scheduled follow-up echocardiogram in about 2 weeks.  We will plan to see her back here in 2 weeks.    Antony Odea, PA-C [facility] [phone]

## 2022-05-20 NOTE — Telephone Encounter (Signed)
Per additional phone message started in Epic:  [facility] calling, she said, pt told them yesterday, pt is refusing care from them and wanted [facility] instead, Maggy said, they will not take her INR today or tomorrow. If there's any question, she said to call her back  Attempted to call pt to make new pt appointment in the clinic since she is refusing [facility].  LMOM TCB

## 2022-05-20 NOTE — Telephone Encounter (Signed)
Attempted to return call to pt, LMOM TCB for appt.  If pt is refusing home health she must come into the office for a new pt appointment ASAP.

## 2022-05-20 NOTE — Telephone Encounter (Signed)
Called pt in regards to INR.  Pt reports does not want [facility] services.  Asked pt who will be following INR pt does not know.  Asked pt if she would like our office to follow her she said yes.  I advised pt that will have to come in for appointments; expressed she is tired of running here and there.  Advised pt that I would send a message to the coumadin clinic to f/u.

## 2022-05-20 NOTE — Patient Instructions (Signed)
Stop taking the losartan.  Take the Coumadin as prescribed 5 mg daily.  We will schedule a PT/INR with the Coumadin clinic in 1 week  Follow-up with cardiology next week as scheduled.  Follow-up in our office in 2 weeks

## 2022-05-20 NOTE — Telephone Encounter (Signed)
Maggy manager - [facility] calling, she said, pt told them yesterday, pt is refusing care from them and wanted [facility] instead, Maggy said, they will not take her INR today or tomorrow. If there's any question, she said to call her back

## 2022-05-20 NOTE — Telephone Encounter (Signed)
Attempted to call Ihor Austin, RN with [facility] back, had to leave message for her to call us back.  Will await call back.   On VM Stephanie requested fax be sent to their office [phone] for INR.  Script written and faxed to [facility] for INR to be done today 6/15 or tomorrow 6/16.

## 2022-05-21 NOTE — Telephone Encounter (Signed)
Spoke with pt and advised pt on needing to come into the office and have INR checked. Made an appt over the phone on 05/24/2022 at 1030am, gve physical address and she wrote everything down and repeated back to me to confirm.

## 2022-05-22 ENCOUNTER — Emergency Department (HOSPITAL_COMMUNITY): Payer: Medicare Other

## 2022-05-22 ENCOUNTER — Encounter (HOSPITAL_COMMUNITY): Payer: Self-pay | Admitting: Emergency Medicine

## 2022-05-22 ENCOUNTER — Inpatient Hospital Stay (HOSPITAL_COMMUNITY)
Admission: EM | Admit: 2022-05-22 | Discharge: 2022-05-26 | DRG: 682 | Disposition: A | Discharge status: Home / Self Care | Payer: Medicare Other | Attending: Family Medicine | Admitting: Family Medicine

## 2022-05-22 ENCOUNTER — Other Ambulatory Visit: Payer: Self-pay

## 2022-05-22 DIAGNOSIS — I472 Ventricular tachycardia, unspecified: Secondary | ICD-10-CM | POA: Diagnosis not present

## 2022-05-22 DIAGNOSIS — E119 Type 2 diabetes mellitus without complications: Secondary | ICD-10-CM

## 2022-05-22 DIAGNOSIS — E1169 Type 2 diabetes mellitus with other specified complication: Secondary | ICD-10-CM

## 2022-05-22 DIAGNOSIS — I5033 Acute on chronic diastolic (congestive) heart failure: Secondary | ICD-10-CM | POA: Diagnosis present

## 2022-05-22 DIAGNOSIS — M5136 Other intervertebral disc degeneration, lumbar region: Secondary | ICD-10-CM | POA: Diagnosis present

## 2022-05-22 DIAGNOSIS — E1140 Type 2 diabetes mellitus with diabetic neuropathy, unspecified: Secondary | ICD-10-CM | POA: Diagnosis present

## 2022-05-22 DIAGNOSIS — M25552 Pain in left hip: Secondary | ICD-10-CM | POA: Diagnosis not present

## 2022-05-22 DIAGNOSIS — M797 Fibromyalgia: Secondary | ICD-10-CM | POA: Diagnosis present

## 2022-05-22 DIAGNOSIS — F419 Anxiety disorder, unspecified: Secondary | ICD-10-CM | POA: Diagnosis present

## 2022-05-22 DIAGNOSIS — Z8249 Family history of ischemic heart disease and other diseases of the circulatory system: Secondary | ICD-10-CM

## 2022-05-22 DIAGNOSIS — I959 Hypotension, unspecified: Secondary | ICD-10-CM | POA: Diagnosis present

## 2022-05-22 DIAGNOSIS — Z8679 Personal history of other diseases of the circulatory system: Secondary | ICD-10-CM

## 2022-05-22 DIAGNOSIS — Z885 Allergy status to narcotic agent status: Secondary | ICD-10-CM

## 2022-05-22 DIAGNOSIS — B182 Chronic viral hepatitis C: Secondary | ICD-10-CM | POA: Diagnosis present

## 2022-05-22 DIAGNOSIS — N179 Acute kidney failure, unspecified: Principal | ICD-10-CM | POA: Diagnosis present

## 2022-05-22 DIAGNOSIS — I110 Hypertensive heart disease with heart failure: Secondary | ICD-10-CM | POA: Diagnosis present

## 2022-05-22 DIAGNOSIS — R5381 Other malaise: Secondary | ICD-10-CM | POA: Diagnosis present

## 2022-05-22 DIAGNOSIS — W010XXA Fall on same level from slipping, tripping and stumbling without subsequent striking against object, initial encounter: Secondary | ICD-10-CM | POA: Diagnosis present

## 2022-05-22 DIAGNOSIS — R0609 Other forms of dyspnea: Secondary | ICD-10-CM | POA: Diagnosis present

## 2022-05-22 DIAGNOSIS — M160 Bilateral primary osteoarthritis of hip: Secondary | ICD-10-CM | POA: Diagnosis present

## 2022-05-22 DIAGNOSIS — I480 Paroxysmal atrial fibrillation: Secondary | ICD-10-CM | POA: Diagnosis present

## 2022-05-22 DIAGNOSIS — Y92009 Unspecified place in unspecified non-institutional (private) residence as the place of occurrence of the external cause: Secondary | ICD-10-CM

## 2022-05-22 DIAGNOSIS — M25462 Effusion, left knee: Secondary | ICD-10-CM | POA: Diagnosis present

## 2022-05-22 DIAGNOSIS — I471 Supraventricular tachycardia: Secondary | ICD-10-CM | POA: Diagnosis not present

## 2022-05-22 DIAGNOSIS — W19XXXA Unspecified fall, initial encounter: Secondary | ICD-10-CM

## 2022-05-22 DIAGNOSIS — F32A Depression, unspecified: Secondary | ICD-10-CM | POA: Diagnosis present

## 2022-05-22 DIAGNOSIS — I4892 Unspecified atrial flutter: Secondary | ICD-10-CM | POA: Diagnosis present

## 2022-05-22 DIAGNOSIS — E7800 Pure hypercholesterolemia, unspecified: Secondary | ICD-10-CM | POA: Diagnosis present

## 2022-05-22 DIAGNOSIS — Z79899 Other long term (current) drug therapy: Secondary | ICD-10-CM

## 2022-05-22 DIAGNOSIS — F17210 Nicotine dependence, cigarettes, uncomplicated: Secondary | ICD-10-CM | POA: Diagnosis present

## 2022-05-22 DIAGNOSIS — R791 Abnormal coagulation profile: Secondary | ICD-10-CM | POA: Diagnosis present

## 2022-05-22 DIAGNOSIS — G894 Chronic pain syndrome: Secondary | ICD-10-CM | POA: Diagnosis present

## 2022-05-22 DIAGNOSIS — Z833 Family history of diabetes mellitus: Secondary | ICD-10-CM

## 2022-05-22 DIAGNOSIS — S76812A Strain of other specified muscles, fascia and tendons at thigh level, left thigh, initial encounter: Secondary | ICD-10-CM | POA: Diagnosis present

## 2022-05-22 DIAGNOSIS — Z7982 Long term (current) use of aspirin: Secondary | ICD-10-CM

## 2022-05-22 DIAGNOSIS — Z7901 Long term (current) use of anticoagulants: Secondary | ICD-10-CM

## 2022-05-22 HISTORY — DX: Gastro-esophageal reflux disease without esophagitis: K21.9

## 2022-05-22 HISTORY — DX: Cardiac arrhythmia, unspecified: I49.9

## 2022-05-22 HISTORY — DX: Anxiety disorder, unspecified: F41.9

## 2022-05-22 LAB — CBC WITH DIFFERENTIAL/PLATELET
Abs Immature Granulocytes: 0.03 10*3/uL (ref 0.00–0.07)
Basophils Absolute: 0.1 10*3/uL (ref 0.0–0.1)
Basophils Relative: 1 %
Eosinophils Absolute: 0.2 10*3/uL (ref 0.0–0.5)
Eosinophils Relative: 2 %
HCT: 32.5 % — ABNORMAL LOW (ref 36.0–46.0)
Hemoglobin: 10.1 g/dL — ABNORMAL LOW (ref 12.0–15.0)
Immature Granulocytes: 1 %
Lymphocytes Relative: 21 %
Lymphs Abs: 1.3 10*3/uL (ref 0.7–4.0)
MCH: 28.1 pg (ref 26.0–34.0)
MCHC: 31.1 g/dL (ref 30.0–36.0)
MCV: 90.3 fL (ref 80.0–100.0)
Monocytes Absolute: 0.4 10*3/uL (ref 0.1–1.0)
Monocytes Relative: 7 %
Neutro Abs: 4.3 10*3/uL (ref 1.7–7.7)
Neutrophils Relative %: 68 %
Platelets: 202 10*3/uL (ref 150–400)
RBC: 3.6 MIL/uL — ABNORMAL LOW (ref 3.87–5.11)
RDW: 15.1 % (ref 11.5–15.5)
WBC: 6.3 10*3/uL (ref 4.0–10.5)
nRBC: 0 % (ref 0.0–0.2)

## 2022-05-22 LAB — COMPREHENSIVE METABOLIC PANEL
ALT: 11 U/L (ref 0–44)
AST: 19 U/L (ref 15–41)
Albumin: 3.5 g/dL (ref 3.5–5.0)
Alkaline Phosphatase: 119 U/L (ref 38–126)
Anion gap: 9 (ref 5–15)
BUN: 19 mg/dL (ref 8–23)
CO2: 22 mmol/L (ref 22–32)
Calcium: 7.5 mg/dL — ABNORMAL LOW (ref 8.9–10.3)
Chloride: 107 mmol/L (ref 98–111)
Creatinine, Ser: 2.07 mg/dL — ABNORMAL HIGH (ref 0.44–1.00)
GFR, Estimated: 27 mL/min — ABNORMAL LOW (ref >60)
Glucose, Bld: 127 mg/dL — ABNORMAL HIGH (ref 70–99)
Potassium: 4.1 mmol/L (ref 3.5–5.1)
Sodium: 138 mmol/L (ref 135–145)
Total Bilirubin: 0.5 mg/dL (ref 0.3–1.2)
Total Protein: 6.3 g/dL — ABNORMAL LOW (ref 6.5–8.1)

## 2022-05-22 LAB — PROTIME-INR
INR: 1.7 — ABNORMAL HIGH (ref 0.8–1.2)
Prothrombin Time: 19.7 seconds — ABNORMAL HIGH (ref 11.4–15.2)

## 2022-05-22 LAB — TROPONIN I (HIGH SENSITIVITY)
Troponin I (High Sensitivity): 11 ng/L (ref <18)
Troponin I (High Sensitivity): 12 ng/L (ref <18)

## 2022-05-22 LAB — HIV ANTIBODY (ROUTINE TESTING W REFLEX): HIV Screen 4th Generation wRfx: NONREACTIVE

## 2022-05-22 LAB — CK: Total CK: 153 U/L (ref 38–234)

## 2022-05-22 MED ORDER — LACTATED RINGERS IV BOLUS
500.0000 mL | Freq: Once | INTRAVENOUS | Status: AC
  Administered 2022-05-22: 500 mL via INTRAVENOUS

## 2022-05-22 MED ORDER — ACETAMINOPHEN 325 MG PO TABS
650.0000 mg | ORAL_TABLET | Freq: Four times a day (QID) | ORAL | Status: DC | PRN
  Administered 2022-05-22: 650 mg via ORAL
  Filled 2022-05-22: qty 2

## 2022-05-22 MED ORDER — MORPHINE SULFATE (PF) 4 MG/ML IV SOLN
4.0000 mg | Freq: Once | INTRAVENOUS | Status: AC
  Administered 2022-05-22: 4 mg via INTRAVENOUS
  Filled 2022-05-22: qty 1

## 2022-05-22 MED ORDER — WARFARIN - PHARMACIST DOSING INPATIENT: Freq: Every day | Status: DC

## 2022-05-22 MED ORDER — PANTOPRAZOLE SODIUM 40 MG PO TBEC
40.0000 mg | DELAYED_RELEASE_TABLET | Freq: Every day | ORAL | Status: DC
  Administered 2022-05-22 – 2022-05-26 (×5): 40 mg via ORAL
  Filled 2022-05-22 (×5): qty 1

## 2022-05-22 MED ORDER — POLYETHYLENE GLYCOL 3350 17 G PO PACK: 17.0000 g | PACK | Freq: Every day | ORAL | Status: DC | PRN

## 2022-05-22 MED ORDER — ASPIRIN 81 MG PO TBEC
81.0000 mg | DELAYED_RELEASE_TABLET | Freq: Every day | ORAL | Status: DC
  Administered 2022-05-22 – 2022-05-26 (×5): 81 mg via ORAL
  Filled 2022-05-22 (×4): qty 1

## 2022-05-22 MED ORDER — HYDROMORPHONE HCL 1 MG/ML IJ SOLN
0.5000 mg | INTRAMUSCULAR | Status: DC | PRN
  Administered 2022-05-23: 0.5 mg via INTRAVENOUS
  Filled 2022-05-22 (×2): qty 1

## 2022-05-22 MED ORDER — MELATONIN 5 MG PO TABS: 5.0000 mg | ORAL_TABLET | Freq: Every evening | ORAL | Status: DC | PRN

## 2022-05-22 MED ORDER — ATORVASTATIN CALCIUM 10 MG PO TABS
20.0000 mg | ORAL_TABLET | Freq: Every day | ORAL | Status: DC
  Administered 2022-05-23 – 2022-05-26 (×4): 20 mg via ORAL
  Filled 2022-05-22 (×4): qty 2

## 2022-05-22 MED ORDER — METOPROLOL TARTRATE 25 MG PO TABS
25.0000 mg | ORAL_TABLET | Freq: Two times a day (BID) | ORAL | Status: DC
  Administered 2022-05-22: 25 mg via ORAL
  Filled 2022-05-22: qty 1

## 2022-05-22 MED ORDER — FLUOXETINE HCL 20 MG PO CAPS
20.0000 mg | ORAL_CAPSULE | Freq: Every morning | ORAL | Status: DC
  Administered 2022-05-23 – 2022-05-26 (×4): 20 mg via ORAL
  Filled 2022-05-22 (×4): qty 1

## 2022-05-22 MED ORDER — WARFARIN SODIUM 6 MG PO TABS
6.0000 mg | ORAL_TABLET | Freq: Once | ORAL | Status: AC
  Administered 2022-05-22: 6 mg via ORAL
  Filled 2022-05-22: qty 1

## 2022-05-22 MED ORDER — FENTANYL CITRATE PF 50 MCG/ML IJ SOSY
50.0000 ug | PREFILLED_SYRINGE | Freq: Once | INTRAMUSCULAR | Status: AC
  Administered 2022-05-22: 50 ug via INTRAVENOUS
  Filled 2022-05-22: qty 1

## 2022-05-22 MED ORDER — OXYCODONE HCL 5 MG PO TABS
5.0000 mg | ORAL_TABLET | Freq: Four times a day (QID) | ORAL | Status: DC | PRN
  Administered 2022-05-22: 5 mg via ORAL
  Filled 2022-05-22 (×2): qty 1

## 2022-05-22 NOTE — ED Provider Notes (Signed)
[facility]Care of the patient was assumed from [place] PA-C at 1500; see this 38 note for complete history of present illness, review of systems, and physical exam.    Briefly, the patient is a 61 y.o. female who presented to the ED with left hip pain in the setting of a ground level fall. She reportedly slipped out of bed and did the splits with her left leg going out from under her.  Imaging is negative for fractures, though unable to bear weight in ED. Baseline ambulatory without assistance.  Plan at time of handoff:  -Follow-up MRI of left hip  MDM/ED Course: On my initial exam, the patient was resting comfortably and in no acute distress.  I reviewed the patient's past medical history which is notable for hospitalization for pericardial tamponade several weeks ago.  She additionally is on Coumadin for history of mitral valvuloplasty.  Upon obtaining further history, patient is unsure if she hit her head today.  She denies preceding syncopal symptoms prior to her fall.  She states that she is having pain in her neck as well as pain and numbness of the entirety of her left lower extremity.  She states that she was on the ground for approximately an hour and a half before receiving help.  Repeat physical exam with strength 5/5 of the right lower extremity, 3/5 of the left lower extremity.  Able to initiate against antigravity, though unable to hold her leg for an extended period of time.  Given this, her trauma work-up was broadened to include a CT head and cervical spine which were negative.  CK was also obtained given her reported downtime.  Troponin within normal limits at 11. MRI of the pelvis with extensive muscular strain of the left obturator externus without occult fractures. Prior team attempted to ambulate the patient without success.  Given her AKI of unclear etiology and inability to bear weight, I spoke with the inpatient medicine team who agreed to accept the patient to their service.  Plan of care was discussed with the patient who verbalized understanding. At this time, patient is stable for transfer to the floor.  Patient seen in conjunction with the attending physician, Ezequiel Essex, MD, who participated in all aspects of the patient's care and was in agreement with the above plan.   Emergency Department Medication Summary: Medications  aspirin EC tablet 81 mg (has no administration in time range)  atorvastatin (LIPITOR) tablet 20 mg (has no administration in time range)  FLUoxetine (PROZAC) capsule 20 mg (has no administration in time range)  metoprolol tartrate (LOPRESSOR) tablet 25 mg (25 mg Oral Given 05/22/22 2157)  pantoprazole (PROTONIX) EC tablet 40 mg (40 mg Oral Given 05/22/22 2158)  acetaminophen (TYLENOL) tablet 650 mg (650 mg Oral Given 05/22/22 2158)  oxyCODONE (Oxy IR/ROXICODONE) immediate release tablet 5 mg (5 mg Oral Given 05/22/22 2158)  HYDROmorphone (DILAUDID) injection 0.5 mg (has no administration in time range)  polyethylene glycol (MIRALAX / GLYCOLAX) packet 17 g (has no administration in time range)  melatonin tablet 5 mg (has no administration in time range)  warfarin (COUMADIN) tablet 6 mg (has no administration in time range)  Warfarin - Pharmacist Dosing Inpatient (has no administration in time range)  fentaNYL (SUBLIMAZE) injection 50 mcg (50 mcg Intravenous Given 05/22/22 1407)  morphine (PF) 4 MG/ML injection 4 mg (4 mg Intravenous Given 05/22/22 1548)  lactated ringers bolus 500 mL (500 mLs Intravenous New Bag/Given 05/22/22 1809)   Clinical Impression: 1. AKI (acute kidney injury) (  [place])   2. Fall, initial encounter   3. Left hip pain       Keimon Basaldua, [place], MD 05/22/22 2241    Ezequiel Essex, MD 05/22/22 [phone]

## 2022-05-22 NOTE — ED Provider Notes (Signed)
[facility] EMERGENCY DEPARTMENT Provider Note   CSN: [id number] Arrival date & time: 05/22/22  1147     History  Chief Complaint  Patient presents with   [name]    [name] is a 61 y.o. female.  61 year old female with a past medical history of depression, diabetes, hep C, presents to the ED post fall.  Patient reports she was trying to get up in order to "go about her day ", when suddenly she slipped and fell landing on her bottom.  She reports since the incident she was unable to get up.  Has not been able to weight-bear as she reports no strength to her left leg.  She was reported via EMS.  Does not have any prior history of hip lectures.  She has not taken any medication for improvement in her symptoms.  Patient is on Coumadin and reports compliance with this medication.  Did not strike her head, no loss of consciousness, no other injury.  The history is provided by the patient and medical records.  Fall This is a new problem. Pertinent negatives include no chest pain, no abdominal pain, no headaches and no shortness of breath.       Home Medications Prior to Admission medications   Medication Sig Start Date End Date Taking? Authorizing Provider  acetaminophen (TYLENOL) 500 MG tablet Take 1,000 mg by mouth every 6 (six) hours as needed for mild pain.   Yes [provider]  albuterol (PROVENTIL HFA;VENTOLIN HFA) 108 (90 Base) MCG/ACT inhaler Inhale 2 puffs into the lungs every 6 (six) hours as needed for wheezing or shortness of breath.    Yes [provider]  aspirin 81 MG EC tablet Take 81 mg by mouth daily.   Yes [provider]  atorvastatin (LIPITOR) 20 MG tablet Take 20 mg by mouth daily.   Yes [provider]  buPROPion (WELLBUTRIN XL) 300 MG 24 hr tablet Take 300 mg by mouth in the morning. 03/24/22  Yes [provider]  colchicine 0.6 MG tablet Take 1 tablet (0.6 mg total) by mouth daily. 05/17/22  Yes  [name], NP  Cyanocobalamin (VITAMIN B 12 PO) Take 500 mcg by mouth daily.   Yes [provider]  diltiazem (CARDIZEM CD) 300 MG 24 hr capsule Take 1 capsule (300 mg total) by mouth daily. 05/18/22  Yes Josue Hector, MD  ferrous [id number] C-folic acid (TRINSICON / FOLTRIN) capsule Take 1 capsule by mouth 2 (two) times daily after a meal. 04/28/22  Yes Roddenberry, Myron G, PA-C  FLUoxetine (PROZAC) 20 MG capsule Take 20 mg by mouth every morning. 04/14/22  Yes [provider]  furosemide (LASIX) 40 MG tablet Take 40 mg by mouth 2 (two) times daily.   Yes [provider]  gabapentin (NEURONTIN) 600 MG tablet Take 600 mg by mouth 3 (three) times daily. 10/12/21  Yes [provider]  hydrOXYzine (ATARAX) 25 MG tablet Take 25 mg by mouth every 4 (four) hours as needed for anxiety.   Yes [provider]  linagliptin (TRADJENTA) 5 MG TABS tablet Take 5 mg by mouth in the morning.   Yes [provider]  methocarbamol (ROBAXIN) 500 MG tablet Take 500 mg by mouth 4 (four) times daily.   Yes [provider]  metoprolol tartrate (LOPRESSOR) 50 MG tablet Take 1 tablet (50 mg total) by mouth 2 (two) times daily. 05/17/22  Yes [name], NP  nystatin ointment (MYCOSTATIN) Apply topically  2 (two) times daily. 04/05/22  Yes [provider]  omeprazole (PRILOSEC) 20 MG capsule Take 20 mg by mouth daily.   Yes [provider]  oxyCODONE (ROXICODONE) 15 MG immediate release tablet Take 15 mg by mouth every 4 (four) hours as needed for pain. 05/06/22  Yes [provider]  PREMARIN 0.45 MG tablet Take 0.45 mg by mouth daily. 01/11/22  Yes [provider]  traZODone (DESYREL) 100 MG tablet Take 2.5 tablets (250 mg total) by mouth at bedtime as needed for sleep. 11/23/18  Yes [name], MD  warfarin (COUMADIN) 5 MG tablet Take 1 tablet (5 mg total) by mouth daily. 05/17/22  Yes [name], NP   ACCU-CHEK FASTCLIX LANCETS MISC 1 each by Does not apply route daily. Check blood sugar once daily. 250.00 10/26/11   Lane Hacker L, DO  glucose blood (ACCU-CHEK SMARTVIEW) test strip Check blood sugar once daily. 250.00 10/26/11 12/30/21  [name], DO  naloxone [facility]) nasal spray 4 mg/0.1 mL Place 1 spray into the nose once.    [provider]      Allergies    Hydrocodone-acetaminophen and Hydrocodone    Review of Systems   Review of Systems  Constitutional:  Negative for fever.  Respiratory:  Negative for shortness of breath.   Cardiovascular:  Negative for chest pain.  Gastrointestinal:  Negative for abdominal pain, nausea and vomiting.  Musculoskeletal:  Positive for arthralgias.  Neurological:  Negative for headaches.  All other systems reviewed and are negative.   Physical Exam Updated Vital Signs BP 124/85 (BP Location: Right Arm)   Pulse 72   Temp 97.9 F (36.6 C) (Axillary)   Resp 16   Ht '5\' 11"'$  (1.803 m)   Wt 96.2 kg   LMP 12/06/1976   SpO2 98%   BMI 29.57 kg/m  Physical Exam Vitals and nursing note reviewed.  Constitutional:      Appearance: Normal appearance.  Eyes:     Pupils: Pupils are equal, round, and reactive to light.  Pulmonary:     Effort: Pulmonary effort is normal.     Breath sounds: No wheezing.  Abdominal:     General: Abdomen is flat.  Musculoskeletal:     Cervical back: Normal range of motion and neck supple.     Left hip: Tenderness present. Decreased range of motion.     Right knee: Erythema present. No ecchymosis.     Left knee: Erythema present. No ecchymosis.       Legs:     Comments: Able to fully lift left leg off the stretcher, decrease strength with plantarflexion and dorsiflexion. 2+ DP and PT pulses.   Skin:    General: Skin is warm and dry.  Neurological:     Mental Status: She is alert and oriented to person, place, and time.     ED Results / Procedures / Treatments   Labs (all labs  ordered are listed, but only abnormal results are displayed) Labs Reviewed  CBC WITH DIFFERENTIAL/PLATELET - Abnormal; Notable for the following components:      Result Value   RBC 3.60 (*)    Hemoglobin 10.1 (*)    HCT 32.5 (*)    All other components within normal limits  COMPREHENSIVE METABOLIC PANEL - Abnormal; Notable for the following components:   Glucose, Bld 127 (*)    Creatinine, Ser 2.07 (*)    Calcium 7.5 (*)    Total Protein 6.3 (*)    GFR,  Estimated 27 (*)    All other components within normal limits  PROTIME-INR - Abnormal; Notable for the following components:   Prothrombin Time 19.7 (*)    INR 1.7 (*)    All other components within normal limits  URINALYSIS, ROUTINE W REFLEX MICROSCOPIC - Abnormal; Notable for the following components:   APPearance HAZY (*)    All other components within normal limits  PROTIME-INR - Abnormal; Notable for the following components:   Prothrombin Time 20.0 (*)    INR 1.7 (*)    All other components within normal limits  CBC WITH DIFFERENTIAL/PLATELET - Abnormal; Notable for the following components:   RBC 3.70 (*)    Hemoglobin 10.4 (*)    HCT 32.4 (*)    All other components within normal limits  CK  HIV ANTIBODY (ROUTINE TESTING W REFLEX)  TROPONIN I (HIGH SENSITIVITY)  TROPONIN I (HIGH SENSITIVITY)  TROPONIN I (HIGH SENSITIVITY)  TROPONIN I (HIGH SENSITIVITY)    EKG EKG Interpretation  Date/Time:  Saturday May 22 2022 18:06:06 EDT Ventricular Rate:  109 PR Interval:    QRS Duration: 88 QT Interval:  336 QTC Calculation: 452 R Axis:   22 Text Interpretation: Atrial flutter with variable A-V block Nonspecific ST and T wave abnormality Abnormal ECG When compared with ECG of 10-May-2022 06:01, PREVIOUS ECG IS PRESENT No significant change was found Confirmed by Ezequiel Essex [phone]) on 05/22/2022 6:20:54 PM  Radiology DG Chest 1 View  Result Date: 05/22/2022 CLINICAL DATA:  Evaluate for pulmonary edema. EXAM:  CHEST  1 VIEW COMPARISON:  Chest radiograph dated 05/20/2022. FINDINGS: There is cardiomegaly with vascular congestion and mild edema, progressed since the prior radiograph. Superimposed pneumonia is not excluded. Clinical correlation is recommended. No focal consolidation, pleural effusion or pneumothorax. Mitral bowel repair. Median sternotomy wires. No acute osseous pathology. IMPRESSION: Cardiomegaly with vascular congestion and mild edema, progressed since the prior radiograph. Electronically Signed   By: Anner Crete M.D.   On: 05/22/2022 21:34   DG Knee 1-2 Views Left  Result Date: 05/22/2022 CLINICAL DATA:  Fall and trauma to the left knee. EXAM: LEFT KNEE - 1-2 VIEW COMPARISON:  Left lower extremity radiograph dated 05/22/2022. FINDINGS: There is no acute fracture or dislocation. The bones are well mineralized. Mild arthritic changes of the left knee. No joint effusion. The soft tissues are unremarkable. IMPRESSION: No acute fracture or dislocation. Electronically Signed   By: Anner Crete M.D.   On: 05/22/2022 20:11   CT Head Wo Contrast  Result Date: 05/22/2022 CLINICAL DATA:  Polytrauma, blunt fall on coumadin; Polytrauma, blunt EXAM: CT HEAD WITHOUT CONTRAST CT CERVICAL SPINE WITHOUT CONTRAST TECHNIQUE: Multidetector CT imaging of the head and cervical spine was performed following the standard protocol without intravenous contrast. Multiplanar CT image reconstructions of the cervical spine were also generated. RADIATION DOSE REDUCTION: This exam was performed according to the departmental dose-optimization program which includes automated exposure control, adjustment of the mA and/or kV according to patient size and/or use of iterative reconstruction technique. COMPARISON:  None Available. FINDINGS: CT HEAD FINDINGS Brain: No evidence of large-territorial acute infarction. No parenchymal hemorrhage. No mass lesion. No extra-axial collection. No mass effect or midline shift. No  hydrocephalus. Basilar cisterns are patent. Vascular: No hyperdense vessel. Skull: No acute fracture or focal lesion. Sinuses/Orbits: Paranasal sinuses and mastoid air cells are clear. The orbits are unremarkable. Other: None. CT CERVICAL SPINE FINDINGS Alignment: Straightening of normal cervical lordosis likely due to positioning and degenerative changes. Skull  base and vertebrae: Multilevel degenerative changes of the spine. No associated severe osseous neural foraminal or central canal stenosis. No acute fracture. No aggressive appearing focal osseous lesion or focal pathologic process. Soft tissues and spinal canal: No prevertebral fluid or swelling. No visible canal hematoma. Upper chest: Partially visualized bilateral pleural effusions. Interlobular septal wall thickening. Other: None. IMPRESSION: 1. No acute intracranial abnormality. 2. No acute displaced fracture or traumatic listhesis of the cervical spine. 3. Partially visualized bilateral pleural effusions. Recommend PA and lateral view of the chest for further evaluation. 4. Likely pulmonary edema. Electronically Signed   By: Iven Finn M.D.   On: 05/22/2022 19:51   CT Cervical Spine Wo Contrast  Result Date: 05/22/2022 CLINICAL DATA:  Polytrauma, blunt fall on coumadin; Polytrauma, blunt EXAM: CT HEAD WITHOUT CONTRAST CT CERVICAL SPINE WITHOUT CONTRAST TECHNIQUE: Multidetector CT imaging of the head and cervical spine was performed following the standard protocol without intravenous contrast. Multiplanar CT image reconstructions of the cervical spine were also generated. RADIATION DOSE REDUCTION: This exam was performed according to the departmental dose-optimization program which includes automated exposure control, adjustment of the mA and/or kV according to patient size and/or use of iterative reconstruction technique. COMPARISON:  None Available. FINDINGS: CT HEAD FINDINGS Brain: No evidence of large-territorial acute infarction. No  parenchymal hemorrhage. No mass lesion. No extra-axial collection. No mass effect or midline shift. No hydrocephalus. Basilar cisterns are patent. Vascular: No hyperdense vessel. Skull: No acute fracture or focal lesion. Sinuses/Orbits: Paranasal sinuses and mastoid air cells are clear. The orbits are unremarkable. Other: None. CT CERVICAL SPINE FINDINGS Alignment: Straightening of normal cervical lordosis likely due to positioning and degenerative changes. Skull base and vertebrae: Multilevel degenerative changes of the spine. No associated severe osseous neural foraminal or central canal stenosis. No acute fracture. No aggressive appearing focal osseous lesion or focal pathologic process. Soft tissues and spinal canal: No prevertebral fluid or swelling. No visible canal hematoma. Upper chest: Partially visualized bilateral pleural effusions. Interlobular septal wall thickening. Other: None. IMPRESSION: 1. No acute intracranial abnormality. 2. No acute displaced fracture or traumatic listhesis of the cervical spine. 3. Partially visualized bilateral pleural effusions. Recommend PA and lateral view of the chest for further evaluation. 4. Likely pulmonary edema. Electronically Signed   By: Iven Finn M.D.   On: 05/22/2022 19:51   MR PELVIS WO CONTRAST  Result Date: 05/22/2022 CLINICAL DATA:  Hip trauma, fracture suspected, xray done EXAM: MRI PELVIS WITHOUT CONTRAST TECHNIQUE: Multiplanar multisequence MR imaging of the pelvis was performed. No intravenous contrast was administered. COMPARISON:  Same-day x-ray and CT FINDINGS: Bones/Joint/Cartilage No acute fracture. No dislocation. No femoral head avascular necrosis. 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 No hip joint effusion. Bony pelvis intact without diastasis. SI joints and pubic symphysis within normal limits. Degenerative facet arthropathy of L4-5 and L5-S1 with associated reactive subchondral bone marrow edema. Elsewhere, no bone  marrow edema. No marrow replacing bone lesion. Ligaments Intact. Muscles and Tendons Strain with low-grade muscle tear involving the inferior aspect of the distal left gluteus minimus muscle (series 9, image 29). Distal tendon intact. Extensive muscle strain with low-grade tearing involving the left obturator externus muscle (series 9, image 38) without tendon disruption or intramuscular hematoma. No additional acute musculotendinous findings. Soft tissues Mild nonspecific subcutaneous edema. No organized fluid collection or hematoma. No inguinal lymphadenopathy. Small volume free fluid within the pelvis. IMPRESSION: 1. No acute osseous abnormality of the pelvis. Specifically, no fracture of the left hip. 2. Extensive  muscle strain with low-grade tearing involving the left obturator externus muscle without tendon disruption or intramuscular hematoma. 3. Muscle strain with low-grade tear involving the inferior aspect of the distal left gluteus minimus muscle. 4. 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 5. Degenerative facet arthropathy of L4-5 and L5-S1 with associated reactive subchondral bone marrow edema. Electronically Signed   By: Davina Poke D.O.   On: 05/22/2022 19:39   DG Knee 2 Views Left  Result Date: 05/22/2022 CLINICAL DATA:  Fall.  Knee pain and swelling. EXAM: LEFT KNEE - 2 VIEW COMPARISON:  Left knee radiographs 08/24/2011 FINDINGS: Progressive degenerative changes are present within the medial compartment left knee. No acute fracture dislocation is present. No significant effusion is present. Soft tissues are unremarkable. IMPRESSION: 1. 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 2. No acute abnormality. Electronically Signed   By: San Morelle M.D.   On: 05/22/2022 16:22   DG Femur 1 View Left  Result Date: 05/22/2022 CLINICAL DATA:  Fall.  Knee pain and swelling. EXAM: LEFT FEMUR 1 VIEW COMPARISON:  None Available. FINDINGS: AP  images demonstrate no acute fracture. Degenerative changes are present in the knee. IMPRESSION: 1. No acute abnormality. 2. Degenerative changes of the knee. Electronically Signed   By: San Morelle M.D.   On: 05/22/2022 16:20   CT Hip Left Wo Contrast  Result Date: 05/22/2022 CLINICAL DATA:  Hip trauma, fracture suspected, xray done EXAM: CT OF THE LEFT HIP WITHOUT CONTRAST TECHNIQUE: Multidetector CT imaging of the left hip was performed according to the standard protocol. Multiplanar CT image reconstructions were also generated. RADIATION DOSE REDUCTION: This exam was performed according to the departmental dose-optimization program which includes automated exposure control, adjustment of the mA and/or kV according to patient size and/or use of iterative reconstruction technique. COMPARISON:  X-ray 05/22/2022 FINDINGS: Bones/Joint/Cartilage No acute fracture. No dislocation. No evidence of femoral head avascular necrosis. Mild left hip osteoarthritis with joint space narrowing and small marginal osteophyte formation. No appreciable hip joint effusion. Included portion of the left hemipelvis is intact without evidence of fracture or diastasis. No suspicious lytic or sclerotic bone lesion. Ligaments Suboptimally assessed by CT. Muscles and Tendons No acute musculotendinous abnormality by CT. Soft tissues No fluid collection or hematoma.  No left inguinal lymphadenopathy. IMPRESSION: No acute fracture or dislocation of the left hip. Electronically Signed   By: Davina Poke D.O.   On: 05/22/2022 15:22   DG Hip Unilat W or Wo Pelvis 2-3 Views Left  Result Date: 05/22/2022 CLINICAL DATA:  Acute LEFT hip pain following fall. Initial encounter. EXAM: DG HIP (WITH OR WITHOUT PELVIS) 2-3V LEFT COMPARISON:  12/18/2020 CT FINDINGS: There is no evidence of hip fracture or dislocation. There is no evidence of arthropathy or other focal bone abnormality. IMPRESSION: Negative. Electronically Signed   By:  Margarette Canada M.D.   On: 05/22/2022 13:17    Procedures Procedures    Medications Ordered in ED Medications  aspirin EC tablet 81 mg (81 mg Oral Given 05/23/22 1024)  atorvastatin (LIPITOR) tablet 20 mg (20 mg Oral Given 05/23/22 1021)  FLUoxetine (PROZAC) capsule 20 mg (20 mg Oral Given 05/23/22 1021)  pantoprazole (PROTONIX) EC tablet 40 mg (40 mg Oral Given 05/23/22 1021)  acetaminophen (TYLENOL) tablet 650 mg (650 mg Oral Given 05/22/22 2158)  HYDROmorphone (DILAUDID) injection 0.5 mg (0.5 mg Intravenous Given 05/23/22 0502)  polyethylene glycol (MIRALAX / GLYCOLAX) packet 17 g (has no administration in time range)  melatonin tablet 5 mg (has no administration in time range)  Warfarin - Pharmacist Dosing Inpatient ( Does not apply Given by Other 05/23/22 1511)  metoprolol tartrate (LOPRESSOR) tablet 50 mg (50 mg Oral Given 05/23/22 0611)  diltiazem (CARDIZEM CD) 24 hr capsule 300 mg (300 mg Oral Given 05/23/22 0946)  albuterol (PROVENTIL) (2.5 MG/3ML) 0.083% nebulizer solution 2.5 mg (has no administration in time range)  buPROPion (WELLBUTRIN XL) 24 hr tablet 300 mg (has no administration in time range)  colchicine tablet 0.6 mg (0.6 mg Oral Given 05/23/22 1021)  ferrous [id number] C-folic acid (TRINSICON / FOLTRIN) capsule 1 capsule (1 capsule Oral Given 05/23/22 1510)  gabapentin (NEURONTIN) tablet 300 mg (300 mg Oral Given 05/23/22 1510)  hydrOXYzine (ATARAX) tablet 25 mg (has no administration in time range)  methocarbamol (ROBAXIN) tablet 500 mg (500 mg Oral Given 05/23/22 1509)  oxyCODONE (Oxy IR/ROXICODONE) immediate release tablet 15 mg (15 mg Oral Given 05/23/22 1515)  estrogens (conjugated) (PREMARIN) tablet 0.45 mg (has no administration in time range)  traZODone (DESYREL) tablet 250 mg (has no administration in time range)  nitroGLYCERIN (NITROSTAT) SL tablet 0.4 mg (0.4 mg Sublingual Given 05/23/22 1028)  fentaNYL (SUBLIMAZE) injection 50 mcg (50 mcg Intravenous Given 05/22/22  1407)  morphine (PF) 4 MG/ML injection 4 mg (4 mg Intravenous Given 05/22/22 1548)  lactated ringers bolus 500 mL (0 mLs Intravenous Stopped 05/23/22 0449)  warfarin (COUMADIN) tablet 6 mg (6 mg Oral Given 05/22/22 2244)  LORazepam (ATIVAN) injection 0.5 mg (0.5 mg Intravenous Given 05/23/22 1021)  warfarin (COUMADIN) tablet 7.5 mg (7.5 mg Oral Given 05/23/22 1510)    ED Course/ Medical Decision Making/ A&P                           Medical Decision Making Amount and/or Complexity of Data Reviewed Labs: ordered. Radiology: ordered. ECG/medicine tests: ordered.  Risk Prescription drug management. Decision regarding hospitalization.   This patient presents to the ED for concern of Fall, this involves a number of treatment options, and is a complaint that carries with it a high risk of complications and morbidity.  The differential diagnosis includes hip fracture versus dislocation.    Co morbidities: Discussed in HPI   Brief History:  Presents to the ED status post fall while trying to ambulate at home.  Reports falling mostly on her bottom, feeling pain along the left hip unable to get up for an hour.  Has not been ambulating since.  EMR reviewed including pt PMHx, past surgical history and past visits to ER.   See HPI for more details   Lab Tests:  I ordered and independently interpreted labs.  The pertinent results include:    N/A   Imaging Studies:  X-ray of the left hip without any acute findings such as fracture or dislocation.   Cardiac Monitoring:  N/A   Medicines ordered:  I ordered medication including fentanyl  for pain control Reevaluation of the patient after these medicines showed that the patient improved I have reviewed the patients home medicines and have made adjustments as needed Reevaluation:  After the interventions noted above I re-evaluated patient and found that they have :stayed the same   Social Determinants of Health:  The  patient's social determinants of health were a factor in the care of this patient    Problem List / ED Course:  Patient presents to the ED status post mechanical fall with some left hip pain.  X-ray was without any acute finding.  Patient was given fentanyl for pain control, reports she is still unable to ambulate, some suspicion for occult fracture, CT left hip was ordered for further evaluation. 2:33 PM I attempted to have patient's stand with nurse Raquel Sarna at the bedside, she was unable to take any steps, however she was able to bear weight.  I do feel the patient needs a CT pelvis to rule out occult fracture   Dispostion:  After consideration of the diagnostic results and the patients response to treatment, I feel that the patent would benefit from MRI pelvis to further evaluate injury.   Patient care signed out pending MRI Pelvis to evaluate for further injury.     Portions of this note were generated with [employer]. Dictation errors may occur despite best attempts at proofreading.   Final Clinical Impression(s) / ED Diagnoses Final diagnoses:  Fall, initial encounter  Left hip pain  AKI (acute kidney injury) [facility])    Rx / [place] Orders ED Discharge Orders     None         Janeece Fitting, Hershal Coria 05/23/22 1536    Luna Fuse, MD 05/24/22 1705

## 2022-05-22 NOTE — ED Notes (Signed)
Attempt to ambulate patient at bedside patient able to stand with 2 assist but unable to ambulate with leg feeling numb.

## 2022-05-22 NOTE — Progress Notes (Addendum)
ANTICOAGULATION CONSULT NOTE - Initial Consult  Pharmacy Consult for Warfarin Indication: atrial fibrillation  Allergies  Allergen Reactions   Hydrocodone-Acetaminophen Itching   Hydrocodone Itching    Tolerates oxycodone     Patient Measurements: Height: '5\' 11"'$  (180.3 cm) Weight: 96.2 kg (212 lb) IBW/kg (Calculated) : 70.8  Vital Signs: Temp: 97.8 F (36.6 C) (06/17 1200) Temp Source: Oral (06/17 1200) BP: 137/83 (06/17 1815) Pulse Rate: 90 (06/17 1815)  Labs: Recent Labs    05/22/22 1531 05/22/22 1727  HGB 10.1*  --   HCT 32.5*  --   PLT 202  --   LABPROT  --  19.7*  INR  --  1.7*  CREATININE 2.07*  --   CKTOTAL  --  153  TROPONINIHS  --  11    Estimated Creatinine Clearance: 36 mL/min (A) (by C-G formula based on SCr of 2.07 mg/dL (H)).   Medical History: Past Medical History:  Diagnosis Date   Acute pericardial effusion 05/17/2022   AKI (acute kidney injury) ([place]) 05/17/2022   Arthritis    Cervicalgia    Chondromalacia of patella    Chronic neck pain    Chronic pain syndrome    Depression    secondary to loss of her son at age 52   Diabetes mellitus    Diabetic neuropathy ([place])    DMII (diabetes mellitus, type 2) ([place])    Enthesopathy of hip region    Fibromyalgia    Hep C w/o coma, chronic ([place])    Hepatitis C    diagnosed 2005   Hypertension    Insomnia    Insomnia    Low back pain    Lumbago    Mitral regurgitation    Obesity    Pain in joint, upper arm    Primary localized osteoarthrosis, lower leg    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   Substance abuse ([place])    sober since 2001   Tobacco abuse    Tricuspid regurgitation    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    Medications:  (Not in a hospital admission)  Scheduled:   aspirin EC  81 mg Oral Daily   [START ON 05/23/2022] atorvastatin  20 mg Oral Daily   [START ON 05/23/2022] FLUoxetine  20 mg Oral q morning   metoprolol tartrate  25 mg Oral BID    pantoprazole  40 mg Oral Daily   Infusions:  PRN: acetaminophen, HYDROmorphone (DILAUDID) injection, melatonin, oxyCODONE, polyethylene glycol  Assessment: 61 yof with a history of T2DM, Hep C, tobacco use, chronic pain, severe mitral & tricuspic insufficiency, s/p MVR and tricuspid valve repair, atrial flutter, s/p pericardiocentesis on 6/5 (for which KCentra given). Pericardial drain removed 6/10. Patient presenting with fall. Warfarin per pharmacy consult placed for AF.  Chart review reveals on warfarin '5mg'$  daily per last discharge on 6/13. Does not appear has been able to have INR checked since discharge. Last taken 6/16 per patient.  PT/INR is 19.7/1.7 Hgb 10.1 - stable; plt 202  Goal of Therapy:  INR 2-3 Monitor platelets by anticoagulation protocol: Yes   Plan:  Will give '6mg'$  warfarin dose tonight - pending INR response may be able to continue home dose shortly Watch for s/s of bleeding and daily INR/CBC May need further social work assistance for setting up proper INR f/u post-discharge  Lorelei Pont, PharmD, BCPS 05/22/2022 9:46 PM ED Clinical Pharmacist -  [phone]

## 2022-05-22 NOTE — H&P (Signed)
History and Physical  [name] ZOX:[id number] DOB: [birth year] DOA: 05/22/2022  Referring physician: Dr. Delice Bison, [place]  PCP: [facility] [place]  Outpatient Specialists: Cardiology Patient coming from: Home via EMS  Chief Complaint: Fall   HPI: [name] is a 61 y.o. female with medical history significant for severe mitral insufficiency and tricuspid insufficiency status post mitral valve and tricuspid valve repair on 04/19/2022 by Dr. Roxan Hockey, recent large pericardial effusion with tamponade physiology status post pericardiocentesis on 05/10/2022 paroxysmal A-fib on Coumadin, essential hypertension, hyperlipidemia, prediabetes, GERD, chronic anxiety/depression, chronic normocytic anemia, hepatitis C, tobacco use, chronic pain syndrome, who presented to [facility] ED after a ground-level fall at home with left lower extremity.  Work-up in the ED revealed AKI, no acute osseous abnormality of the pelvis, no fracture of the left hip.  Extensive muscle strain with low-grade tearing involving the left obturator externus muscle without tendon disruption or intramuscular hematoma.  Muscle strain with low-grade tear involving the inferior aspect of the distal left gluteus minimus muscle.  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  Degenerative facet arthropathy of L4-5 and L5-S1 with associated reactive subchondral bone marrow edema.  In the ED, she received LR IV fluid bolus 500 cc x 1, IV fentanyl 50 mcg x 1, IV morphine 4 mg x 1.  TRH, hospitalist service, was asked to admit.  ED Course: Tmax 97.8.  BP 130/81, pulse 93, respiration rate 21, O2 saturation 100% on room air.  Lab studies remarkable for serum glucose 127, BUN 19 creatinine 2.07, calcium 7.5, albumin 3.5, GFR of 27.  Hemoglobin 10.1 from 8.95 days ago.  WBC 6.3, platelet 202.  Review of Systems: Review of systems as noted in the HPI. All other systems reviewed and are negative.   Past Medical History:   Diagnosis Date   Acute pericardial effusion 05/17/2022   AKI (acute kidney injury) ([place]) 05/17/2022   Arthritis    Cervicalgia    Chondromalacia of patella    Chronic neck pain    Chronic pain syndrome    Depression    secondary to loss of her son at age 25   Diabetes mellitus    Diabetic neuropathy ([place])    DMII (diabetes mellitus, type 2) ([place])    Enthesopathy of hip region    Fibromyalgia    Hep C w/o coma, chronic ([place])    Hepatitis C    diagnosed 2005   Hypertension    Insomnia    Insomnia    Low back pain    Lumbago    Mitral regurgitation    Obesity    Pain in joint, upper arm    Primary localized osteoarthrosis, lower leg    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   Substance abuse ([place])    sober since 2001   Tobacco abuse    Tricuspid regurgitation    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   Past Surgical History:  Procedure Laterality Date   ABDOMINAL HYSTERECTOMY     at age 57, unknown reasons   CARPAL TUNNEL RELEASE  12/07/2011   left side   EYE SURGERY     FOOT SURGERY Bilateral    MITRAL VALVE REPAIR N/A 04/19/2022   Procedure: MITRAL VALVE REPAIR WITH 30MM PHYSIO II ANNULOPLASTY RING;  Surgeon: Melrose Nakayama, MD;  Location: [place];  Service: Open Heart Surgery;  Laterality: N/A;   PERICARDIOCENTESIS N/A 05/10/2022   Procedure: PERICARDIOCENTESIS;  Surgeon: Jettie Booze, MD;  Location: [place]  CV LAB;  Service: Cardiovascular;  Laterality: N/A;   RIGHT/LEFT HEART CATH AND CORONARY ANGIOGRAPHY N/A 02/12/2022   Procedure: RIGHT/LEFT HEART CATH AND CORONARY ANGIOGRAPHY;  Surgeon: Belva Crome, MD;  Location: [place] CV LAB;  Service: Cardiovascular;  Laterality: N/A;   TEE WITHOUT CARDIOVERSION N/A 02/03/2022   Procedure: TRANSESOPHAGEAL ECHOCARDIOGRAM (TEE);  Surgeon: Josue Hector, MD;  Location: [facility];  Service: Cardiovascular;  Laterality: N/A;   TEE WITHOUT CARDIOVERSION N/A 04/19/2022   Procedure:  TRANSESOPHAGEAL ECHOCARDIOGRAM (TEE);  Surgeon: Melrose Nakayama, MD;  Location: [place];  Service: Open Heart Surgery;  Laterality: N/A;   TRICUSPID VALVE REPLACEMENT N/A 04/19/2022   Procedure: TRICUSPID VALVE REPAIR WITH 32MM MC3 ANNULOPLASTY RING;  Surgeon: Melrose Nakayama, MD;  Location: [place];  Service: Open Heart Surgery;  Laterality: N/A;    Social History:  reports that she has been smoking cigarettes. She has a 17.50 pack-year smoking history. She has never used smokeless tobacco. She reports that she does not drink alcohol and does not use drugs.   Allergies  Allergen Reactions   Hydrocodone-Acetaminophen Itching   Hydrocodone Itching    Tolerates oxycodone     Family History  Problem Relation Age of Onset   Uterine cancer Mother    Alcohol abuse Father    Alcohol abuse Sister    Drug abuse Sister    Drug abuse Brother    Alcohol abuse Brother    Schizophrenia Son    Diabetes Other    Arthritis Other    Hypertension Other    Heart attack Son 67       Died suddenly      Prior to Admission medications   Medication Sig Start Date End Date Taking? Authorizing Provider  ACCU-CHEK FASTCLIX LANCETS MISC 1 each by Does not apply route daily. Check blood sugar once daily. 250.00 10/26/11   [name], DO  acetaminophen (TYLENOL) 500 MG tablet Take 1,000 mg by mouth every 6 (six) hours as needed for mild pain.    [provider]  albuterol (PROVENTIL HFA;VENTOLIN HFA) 108 (90 Base) MCG/ACT inhaler Inhale 2 puffs into the lungs every 6 (six) hours as needed for wheezing or shortness of breath.     [provider]  aspirin 81 MG EC tablet Take 81 mg by mouth daily.    [provider]  atorvastatin (LIPITOR) 20 MG tablet Take 20 mg by mouth daily.    [provider]  buPROPion (WELLBUTRIN XL) 300 MG 24 hr tablet Take 300 mg by mouth in the morning. 03/24/22   [provider]  colchicine 0.6 MG tablet Take 1 tablet  (0.6 mg total) by mouth daily. 05/17/22   [name], NP  Cyanocobalamin (VITAMIN B 12 PO) Take 500 mcg by mouth daily.    [provider]  diltiazem (CARDIZEM CD) 300 MG 24 hr capsule Take 1 capsule (300 mg total) by mouth daily. 05/18/22   Josue Hector, MD  ferrous [id number] C-folic acid (TRINSICON / FOLTRIN) capsule Take 1 capsule by mouth 2 (two) times daily after a meal. 04/28/22   Roddenberry, Myron G, PA-C  FLUoxetine (PROZAC) 20 MG capsule Take 20 mg by mouth every morning. 04/14/22   [provider]  furosemide (LASIX) 40 MG tablet Take 40 mg by mouth 2 (two) times daily.    [provider]  gabapentin (NEURONTIN) 600 MG tablet Take 600 mg by mouth 3 (three) times daily. 10/12/21   [provider]  glucose blood (ACCU-CHEK SMARTVIEW) test strip Check blood sugar once daily. 250.00 10/26/11 12/30/21  [name], DO  hydrOXYzine (ATARAX) 25 MG tablet Take 25 mg by mouth every 4 (four) hours as needed for anxiety.    [provider]  linagliptin (TRADJENTA) 5 MG TABS tablet Take 5 mg by mouth in the morning.    [provider]  methocarbamol (ROBAXIN) 500 MG tablet Take 500 mg by mouth 4 (four) times daily.    [provider]  metoprolol tartrate (LOPRESSOR) 50 MG tablet Take 1 tablet (50 mg total) by mouth 2 (two) times daily. 05/17/22   [name], NP  naloxone [facility]) nasal spray 4 mg/0.1 mL Place 1 spray into the nose once.    [provider]  nystatin ointment (MYCOSTATIN) Apply topically 2 (two) times daily. 04/05/22   [provider]  omeprazole (PRILOSEC) 20 MG capsule Take 20 mg by mouth daily.    [provider]  oxyCODONE (ROXICODONE) 15 MG immediate release tablet Take 15 mg by mouth every 4 (four) hours as needed for pain. Patient not taking: Reported on 05/20/2022 05/06/22   [provider]  PREMARIN 0.45 MG tablet Take 0.45 mg by mouth daily. 01/11/22    [provider]  traZODone (DESYREL) 100 MG tablet Take 2.5 tablets (250 mg total) by mouth at bedtime as needed for sleep. Patient not taking: Reported on 05/20/2022 11/23/18   [name], MD  warfarin (COUMADIN) 5 MG tablet Take 1 tablet (5 mg total) by mouth daily. 05/17/22   [name], NP    Physical Exam: BP 137/83   Pulse 90   Temp 97.8 F (36.6 C) (Oral)   Resp 14   Ht '5\' 11"'$  (1.803 m)   Wt 96.2 kg   LMP 12/06/1976   SpO2 100%   BMI 29.57 kg/m   General: 61 y.o. year-old female well developed well nourished in no acute distress.  Alert and oriented x3. Cardiovascular: Regular rate and rhythm with no rubs or gallops.  No thyromegaly or JVD noted.  No lower extremity edema. 2/4 pulses in all 4 extremities. Respiratory: Clear to auscultation with no wheezes or rales. Good inspiratory effort. Abdomen: Soft nontender nondistended with normal bowel sounds x4 quadrants. Muskuloskeletal: No cyanosis, clubbing or edema noted bilaterally. Mild left knee effusion. Neuro: CN II-XII intact, strength, sensation, reflexes Skin: No ulcerative lesions noted or rashes Psychiatry: Judgement and insight appear normal. Mood is appropriate for condition and setting          Labs on Admission:  Basic Metabolic Panel: Recent Labs  Lab 05/16/22 0850 05/22/22 1531  NA 137 138  K 4.1 4.1  CL 105 107  CO2 24 22  GLUCOSE 122* 127*  BUN 8 19  CREATININE 0.92 2.07*  CALCIUM 8.1* 7.5*   Liver Function Tests: Recent Labs  Lab 05/22/22 1531  AST 19  ALT 11  ALKPHOS 119  BILITOT 0.5  PROT 6.3*  ALBUMIN 3.5   No results for input(s): "LIPASE", "AMYLASE" in the last 168 hours. No results for input(s): "AMMONIA" in the last 168 hours. CBC: Recent Labs  Lab 05/16/22 0850 05/17/22 0606 05/22/22 1531  WBC 5.3 4.7 6.3  NEUTROABS  --   --  4.3  HGB 9.6* 8.9* 10.1*  HCT 29.8* 27.4* 32.5*  MCV 87.9 88.7 90.3  PLT 219 204 202   Cardiac Enzymes: Recent Labs  Lab  05/22/22 1727  CKTOTAL 153  BNP (last 3 results) Recent Labs    12/08/21 0114  BNP 705.8*    ProBNP (last 3 results) No results for input(s): "PROBNP" in the last 8760 hours.  CBG: Recent Labs  Lab 05/16/22 1137 05/16/22 1645 05/16/22 2110 05/17/22 0753 05/17/22 1117  GLUCAP 111* 126* 94 135* 90    Radiological Exams on Admission: DG Knee 1-2 Views Left  Result Date: 05/22/2022 CLINICAL DATA:  Fall and trauma to the left knee. EXAM: LEFT KNEE - 1-2 VIEW COMPARISON:  Left lower extremity radiograph dated 05/22/2022. FINDINGS: There is no acute fracture or dislocation. The bones are well mineralized. Mild arthritic changes of the left knee. No joint effusion. The soft tissues are unremarkable. IMPRESSION: No acute fracture or dislocation. Electronically Signed   By: Anner Crete M.D.   On: 05/22/2022 20:11   CT Head Wo Contrast  Result Date: 05/22/2022 CLINICAL DATA:  Polytrauma, blunt fall on coumadin; Polytrauma, blunt EXAM: CT HEAD WITHOUT CONTRAST CT CERVICAL SPINE WITHOUT CONTRAST TECHNIQUE: Multidetector CT imaging of the head and cervical spine was performed following the standard protocol without intravenous contrast. Multiplanar CT image reconstructions of the cervical spine were also generated. RADIATION DOSE REDUCTION: This exam was performed according to the departmental dose-optimization program which includes automated exposure control, adjustment of the mA and/or kV according to patient size and/or use of iterative reconstruction technique. COMPARISON:  None Available. FINDINGS: CT HEAD FINDINGS Brain: No evidence of large-territorial acute infarction. No parenchymal hemorrhage. No mass lesion. No extra-axial collection. No mass effect or midline shift. No hydrocephalus. Basilar cisterns are patent. Vascular: No hyperdense vessel. Skull: No acute fracture or focal lesion. Sinuses/Orbits: Paranasal sinuses and mastoid air cells are clear. The orbits are  unremarkable. Other: None. CT CERVICAL SPINE FINDINGS Alignment: Straightening of normal cervical lordosis likely due to positioning and degenerative changes. Skull base and vertebrae: Multilevel degenerative changes of the spine. No associated severe osseous neural foraminal or central canal stenosis. No acute fracture. No aggressive appearing focal osseous lesion or focal pathologic process. Soft tissues and spinal canal: No prevertebral fluid or swelling. No visible canal hematoma. Upper chest: Partially visualized bilateral pleural effusions. Interlobular septal wall thickening. Other: None. IMPRESSION: 1. No acute intracranial abnormality. 2. No acute displaced fracture or traumatic listhesis of the cervical spine. 3. Partially visualized bilateral pleural effusions. Recommend PA and lateral view of the chest for further evaluation. 4. Likely pulmonary edema. Electronically Signed   By: Iven Finn M.D.   On: 05/22/2022 19:51   CT Cervical Spine Wo Contrast  Result Date: 05/22/2022 CLINICAL DATA:  Polytrauma, blunt fall on coumadin; Polytrauma, blunt EXAM: CT HEAD WITHOUT CONTRAST CT CERVICAL SPINE WITHOUT CONTRAST TECHNIQUE: Multidetector CT imaging of the head and cervical spine was performed following the standard protocol without intravenous contrast. Multiplanar CT image reconstructions of the cervical spine were also generated. RADIATION DOSE REDUCTION: This exam was performed according to the departmental dose-optimization program which includes automated exposure control, adjustment of the mA and/or kV according to patient size and/or use of iterative reconstruction technique. COMPARISON:  None Available. FINDINGS: CT HEAD FINDINGS Brain: No evidence of large-territorial acute infarction. No parenchymal hemorrhage. No mass lesion. No extra-axial collection. No mass effect or midline shift. No hydrocephalus. Basilar cisterns are patent. Vascular: No hyperdense vessel. Skull: No acute fracture or  focal lesion. Sinuses/Orbits: Paranasal sinuses and mastoid air cells are clear. The orbits are unremarkable. Other: None. CT CERVICAL SPINE FINDINGS Alignment: Straightening of normal cervical lordosis likely due to  positioning and degenerative changes. Skull base and vertebrae: Multilevel degenerative changes of the spine. No associated severe osseous neural foraminal or central canal stenosis. No acute fracture. No aggressive appearing focal osseous lesion or focal pathologic process. Soft tissues and spinal canal: No prevertebral fluid or swelling. No visible canal hematoma. Upper chest: Partially visualized bilateral pleural effusions. Interlobular septal wall thickening. Other: None. IMPRESSION: 1. No acute intracranial abnormality. 2. No acute displaced fracture or traumatic listhesis of the cervical spine. 3. Partially visualized bilateral pleural effusions. Recommend PA and lateral view of the chest for further evaluation. 4. Likely pulmonary edema. Electronically Signed   By: Iven Finn M.D.   On: 05/22/2022 19:51   MR PELVIS WO CONTRAST  Result Date: 05/22/2022 CLINICAL DATA:  Hip trauma, fracture suspected, xray done EXAM: MRI PELVIS WITHOUT CONTRAST TECHNIQUE: Multiplanar multisequence MR imaging of the pelvis was performed. No intravenous contrast was administered. COMPARISON:  Same-day x-ray and CT FINDINGS: Bones/Joint/Cartilage No acute fracture. No dislocation. No femoral head avascular necrosis. 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 No hip joint effusion. Bony pelvis intact without diastasis. SI joints and pubic symphysis within normal limits. Degenerative facet arthropathy of L4-5 and L5-S1 with associated reactive subchondral bone marrow edema. Elsewhere, no bone marrow edema. No marrow replacing bone lesion. Ligaments Intact. Muscles and Tendons Strain with low-grade muscle tear involving the inferior aspect of the distal left gluteus minimus muscle (series 9, image  29). Distal tendon intact. Extensive muscle strain with low-grade tearing involving the left obturator externus muscle (series 9, image 38) without tendon disruption or intramuscular hematoma. No additional acute musculotendinous findings. Soft tissues Mild nonspecific subcutaneous edema. No organized fluid collection or hematoma. No inguinal lymphadenopathy. Small volume free fluid within the pelvis. IMPRESSION: 1. No acute osseous abnormality of the pelvis. Specifically, no fracture of the left hip. 2. Extensive muscle strain with low-grade tearing involving the left obturator externus muscle without tendon disruption or intramuscular hematoma. 3. Muscle strain with low-grade tear involving the inferior aspect of the distal left gluteus minimus muscle. 4. 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 5. Degenerative facet arthropathy of L4-5 and L5-S1 with associated reactive subchondral bone marrow edema. Electronically Signed   By: Davina Poke D.O.   On: 05/22/2022 19:39   DG Knee 2 Views Left  Result Date: 05/22/2022 CLINICAL DATA:  Fall.  Knee pain and swelling. EXAM: LEFT KNEE - 2 VIEW COMPARISON:  Left knee radiographs 08/24/2011 FINDINGS: Progressive degenerative changes are present within the medial compartment left knee. No acute fracture dislocation is present. No significant effusion is present. Soft tissues are unremarkable. IMPRESSION: 1. 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 2. No acute abnormality. Electronically Signed   By: San Morelle M.D.   On: 05/22/2022 16:22   DG Femur 1 View Left  Result Date: 05/22/2022 CLINICAL DATA:  Fall.  Knee pain and swelling. EXAM: LEFT FEMUR 1 VIEW COMPARISON:  None Available. FINDINGS: AP images demonstrate no acute fracture. Degenerative changes are present in the knee. IMPRESSION: 1. No acute abnormality. 2. Degenerative changes of the knee. Electronically Signed   By: San Morelle M.D.    On: 05/22/2022 16:20   CT Hip Left Wo Contrast  Result Date: 05/22/2022 CLINICAL DATA:  Hip trauma, fracture suspected, xray done EXAM: CT OF THE LEFT HIP WITHOUT CONTRAST TECHNIQUE: Multidetector CT imaging of the left hip was performed according to the standard protocol. Multiplanar CT image reconstructions were also generated. RADIATION DOSE REDUCTION: This  exam was performed according to the departmental dose-optimization program which includes automated exposure control, adjustment of the mA and/or kV according to patient size and/or use of iterative reconstruction technique. COMPARISON:  X-ray 05/22/2022 FINDINGS: Bones/Joint/Cartilage No acute fracture. No dislocation. No evidence of femoral head avascular necrosis. Mild left hip osteoarthritis with joint space narrowing and small marginal osteophyte formation. No appreciable hip joint effusion. Included portion of the left hemipelvis is intact without evidence of fracture or diastasis. No suspicious lytic or sclerotic bone lesion. Ligaments Suboptimally assessed by CT. Muscles and Tendons No acute musculotendinous abnormality by CT. Soft tissues No fluid collection or hematoma.  No left inguinal lymphadenopathy. IMPRESSION: No acute fracture or dislocation of the left hip. Electronically Signed   By: Davina Poke D.O.   On: 05/22/2022 15:22   DG Hip Unilat W or Wo Pelvis 2-3 Views Left  Result Date: 05/22/2022 CLINICAL DATA:  Acute LEFT hip pain following fall. Initial encounter. EXAM: DG HIP (WITH OR WITHOUT PELVIS) 2-3V LEFT COMPARISON:  12/18/2020 CT FINDINGS: There is no evidence of hip fracture or dislocation. There is no evidence of arthropathy or other focal bone abnormality. IMPRESSION: Negative. Electronically Signed   By: Margarette Canada M.D.   On: 05/22/2022 13:17    EKG: I independently viewed the EKG done and my findings are as followed: A flutter, rate of 109.  Nonspecific ST-T changes.  QTc 452.  Assessment/Plan Present on  Admission:  AKI (acute kidney injury) ([place])  Principal Problem:   AKI (acute kidney injury) ([place])  AKI on diuretics due to chronic systolic CHF Baseline creatinine appears to be 0.9 with GFR greater than 60 Presented with creatinine of 2.07 with GFR 27. Received IV fluid bolus 500 cc x1 in the ED Hold off home diuretics for now Closely monitor volume status while home diuretics are on hold Monitor urine output Avoid nephrotoxic agents and hypotension. Repeat renal panel in the morning  Muscular strain with low-grade tear seen on noncontrast MRI pelvis Extensive muscle strain with low-grade tearing involving the left obturator externus muscle without tendon disruption or intramuscular hematoma.  Muscle strain with low-grade tear involving the inferior aspect of the distal left gluteus minimus muscle.  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  Degenerative facet arthropathy of L4-5 and L5-S1 with associated reactive subchondral bone marrow edema. Analgesics as needed PT OT assessment for DC planning TOC consulted to arrange for DME's and DC planning  Mild left knee effusion, POA Monitor Analgesics PRN Ice pack  Essential hypertension Avoid hypotension due to AKI Resume home Lopressor Monitor vital signs  Hyperlipidemia Resume home statin  Chronic anxiety/depression Resume home regimen  Status post recent mitral valve and tricuspid valve repair 04/19/2022 Paroxysmal A-fib on Coumadin Resume home Coumadin, appreciate pharmacy's assistance Subtherapeutic INR 1.7 Resume home Lopressor to avoid withdrawal Monitor on telemetry  Recent large pericardial effusion status post pericardiocentesis Follows with cardiology outpatient  Chronic systolic CHF 03-00% Euvolemic Hold off home Lasix for now Start strict I's and O's and daily     DVT prophylaxis: Coumadin, subtherapeutic  Code Status: Full code  Family Communication: None at bedside  Disposition  Plan: Admitted to telemetry medical unit  Consults called: None  Admission status: Observation status   Status is: Observation    Kayleen Memos MD [facility] Pager [phone]  If 7PM-7AM, please contact night-coverage [email or web address] Password [facility]  05/22/2022, 9:23 PM

## 2022-05-22 NOTE — ED Triage Notes (Signed)
Pt BIB [facility] from home due to slipping out of bed onto the floor on left leg and unable to get up in over an hour.  Pt denies LOC or hitting head on the way down.  Left leg is swollen in knee area.   VS BP 138/78, P 80, CBG 118, SpO2 96%.  Pt does endorse being on Coumadin.

## 2022-05-23 ENCOUNTER — Encounter (HOSPITAL_COMMUNITY): Payer: Self-pay | Admitting: Internal Medicine

## 2022-05-23 DIAGNOSIS — F32A Depression, unspecified: Secondary | ICD-10-CM

## 2022-05-23 DIAGNOSIS — G894 Chronic pain syndrome: Secondary | ICD-10-CM

## 2022-05-23 DIAGNOSIS — N179 Acute kidney failure, unspecified: Secondary | ICD-10-CM | POA: Diagnosis not present

## 2022-05-23 DIAGNOSIS — M5136 Other intervertebral disc degeneration, lumbar region: Secondary | ICD-10-CM

## 2022-05-23 DIAGNOSIS — R0609 Other forms of dyspnea: Secondary | ICD-10-CM

## 2022-05-23 DIAGNOSIS — Z8679 Personal history of other diseases of the circulatory system: Secondary | ICD-10-CM

## 2022-05-23 DIAGNOSIS — E119 Type 2 diabetes mellitus without complications: Secondary | ICD-10-CM

## 2022-05-23 DIAGNOSIS — E7800 Pure hypercholesterolemia, unspecified: Secondary | ICD-10-CM

## 2022-05-23 LAB — CBC WITH DIFFERENTIAL/PLATELET
Abs Immature Granulocytes: 0.02 10*3/uL (ref 0.00–0.07)
Basophils Absolute: 0 10*3/uL (ref 0.0–0.1)
Basophils Relative: 1 %
Eosinophils Absolute: 0.1 10*3/uL (ref 0.0–0.5)
Eosinophils Relative: 2 %
HCT: 32.4 % — ABNORMAL LOW (ref 36.0–46.0)
Hemoglobin: 10.4 g/dL — ABNORMAL LOW (ref 12.0–15.0)
Immature Granulocytes: 0 %
Lymphocytes Relative: 18 %
Lymphs Abs: 1.3 10*3/uL (ref 0.7–4.0)
MCH: 28.1 pg (ref 26.0–34.0)
MCHC: 32.1 g/dL (ref 30.0–36.0)
MCV: 87.6 fL (ref 80.0–100.0)
Monocytes Absolute: 0.5 10*3/uL (ref 0.1–1.0)
Monocytes Relative: 7 %
Neutro Abs: 5.2 10*3/uL (ref 1.7–7.7)
Neutrophils Relative %: 72 %
Platelets: 215 10*3/uL (ref 150–400)
RBC: 3.7 MIL/uL — ABNORMAL LOW (ref 3.87–5.11)
RDW: 15.3 % (ref 11.5–15.5)
WBC: 7.2 10*3/uL (ref 4.0–10.5)
nRBC: 0 % (ref 0.0–0.2)

## 2022-05-23 LAB — URINALYSIS, ROUTINE W REFLEX MICROSCOPIC
Bilirubin Urine: NEGATIVE
Glucose, UA: NEGATIVE mg/dL
Hgb urine dipstick: NEGATIVE
Ketones, ur: NEGATIVE mg/dL
Leukocytes,Ua: NEGATIVE
Nitrite: NEGATIVE
Protein, ur: NEGATIVE mg/dL
Specific Gravity, Urine: 1.014 (ref 1.005–1.030)
pH: 5 (ref 5.0–8.0)

## 2022-05-23 LAB — TROPONIN I (HIGH SENSITIVITY)
Troponin I (High Sensitivity): 13 ng/L (ref <18)
Troponin I (High Sensitivity): 14 ng/L (ref <18)

## 2022-05-23 LAB — PROTIME-INR
INR: 1.7 — ABNORMAL HIGH (ref 0.8–1.2)
Prothrombin Time: 20 seconds — ABNORMAL HIGH (ref 11.4–15.2)

## 2022-05-23 MED ORDER — ACETAMINOPHEN 500 MG PO TABS: 1000.0000 mg | ORAL_TABLET | Freq: Four times a day (QID) | ORAL | Status: DC | PRN

## 2022-05-23 MED ORDER — BUPROPION HCL ER (XL) 300 MG PO TB24
300.0000 mg | ORAL_TABLET | Freq: Every morning | ORAL | Status: DC
  Administered 2022-05-23 – 2022-05-26 (×4): 300 mg via ORAL
  Filled 2022-05-23 (×6): qty 1

## 2022-05-23 MED ORDER — COLCHICINE 0.6 MG PO TABS
0.6000 mg | ORAL_TABLET | Freq: Every day | ORAL | Status: DC
  Administered 2022-05-23 – 2022-05-24 (×2): 0.6 mg via ORAL
  Filled 2022-05-23 (×2): qty 1

## 2022-05-23 MED ORDER — NITROGLYCERIN 0.4 MG SL SUBL
0.4000 mg | SUBLINGUAL_TABLET | SUBLINGUAL | Status: DC | PRN
  Administered 2022-05-23 (×2): 0.4 mg via SUBLINGUAL
  Filled 2022-05-23: qty 1

## 2022-05-23 MED ORDER — WARFARIN SODIUM 7.5 MG PO TABS
7.5000 mg | ORAL_TABLET | Freq: Once | ORAL | Status: AC
  Administered 2022-05-23: 7.5 mg via ORAL
  Filled 2022-05-23: qty 1

## 2022-05-23 MED ORDER — ESTROGENS CONJUGATED 0.45 MG PO TABS
0.4500 mg | ORAL_TABLET | Freq: Every day | ORAL | Status: DC
  Administered 2022-05-23 – 2022-05-26 (×4): 0.45 mg via ORAL
  Filled 2022-05-23 (×5): qty 1

## 2022-05-23 MED ORDER — DILTIAZEM HCL ER COATED BEADS 180 MG PO CP24
300.0000 mg | ORAL_CAPSULE | Freq: Every day | ORAL | Status: DC
  Administered 2022-05-23 – 2022-05-24 (×2): 300 mg via ORAL
  Filled 2022-05-23 (×3): qty 1

## 2022-05-23 MED ORDER — METOPROLOL TARTRATE 50 MG PO TABS
50.0000 mg | ORAL_TABLET | Freq: Two times a day (BID) | ORAL | Status: DC
  Administered 2022-05-23 – 2022-05-26 (×7): 50 mg via ORAL
  Filled 2022-05-23 (×6): qty 1
  Filled 2022-05-23: qty 2

## 2022-05-23 MED ORDER — OXYCODONE HCL 5 MG PO TABS
15.0000 mg | ORAL_TABLET | ORAL | Status: DC | PRN
  Administered 2022-05-23 – 2022-05-26 (×14): 15 mg via ORAL
  Filled 2022-05-23 (×14): qty 3

## 2022-05-23 MED ORDER — LORAZEPAM 2 MG/ML IJ SOLN
0.5000 mg | Freq: Once | INTRAMUSCULAR | Status: AC
  Administered 2022-05-23: 0.5 mg via INTRAVENOUS
  Filled 2022-05-23: qty 1

## 2022-05-23 MED ORDER — FE FUMARATE-B12-VIT C-FA-IFC PO CAPS
1.0000 | ORAL_CAPSULE | Freq: Two times a day (BID) | ORAL | Status: DC
  Administered 2022-05-23 – 2022-05-26 (×7): 1 via ORAL
  Filled 2022-05-23 (×9): qty 1

## 2022-05-23 MED ORDER — METHOCARBAMOL 500 MG PO TABS
500.0000 mg | ORAL_TABLET | Freq: Four times a day (QID) | ORAL | Status: DC
  Administered 2022-05-23 – 2022-05-26 (×13): 500 mg via ORAL
  Filled 2022-05-23 (×13): qty 1

## 2022-05-23 MED ORDER — HYDROXYZINE HCL 25 MG PO TABS
25.0000 mg | ORAL_TABLET | ORAL | Status: DC | PRN
  Filled 2022-05-23: qty 1

## 2022-05-23 MED ORDER — TRAZODONE HCL 50 MG PO TABS
250.0000 mg | ORAL_TABLET | Freq: Every evening | ORAL | Status: DC | PRN
  Administered 2022-05-24 – 2022-05-25 (×2): 250 mg via ORAL
  Filled 2022-05-23 (×3): qty 1

## 2022-05-23 MED ORDER — NALOXONE HCL 4 MG/0.1ML NA LIQD
1.0000 | Freq: Once | NASAL | Status: DC
Start: 2022-05-23 — End: 2022-05-23

## 2022-05-23 MED ORDER — ALBUTEROL SULFATE (2.5 MG/3ML) 0.083% IN NEBU: 2.5000 mg | INHALATION_SOLUTION | Freq: Four times a day (QID) | RESPIRATORY_TRACT | Status: DC | PRN

## 2022-05-23 MED ORDER — GABAPENTIN 600 MG PO TABS
300.0000 mg | ORAL_TABLET | Freq: Three times a day (TID) | ORAL | Status: DC
  Administered 2022-05-23 – 2022-05-26 (×10): 300 mg via ORAL
  Filled 2022-05-23 (×10): qty 1

## 2022-05-23 NOTE — ED Notes (Signed)
Pt refusing labs, st she does not want her labs drawn, pt does not want labs pulled from her IV. Pt reports, "no one taking care of me, so no point of getting my blood". Advised pt we are trying our best to accommodate her needs, we are trying our best to give her the best care, advised labs are routine to monitor her levels as they change over time, want to make sure they stay WNL. Pt still refuse at this time. Pt reports wants to sue hospital d/t lack of care as she has been in the ER for hours, advised pt we are trying to get her an inpatient bed in the hospital, currently full and there are others who are also waiting as pt st "I am the only one they forgot about taking upstairs" tried to explain that is not the case, that people being admitted go to different ares in the hospital based on acuity and resources needed and bed availability on certain units, tried to encourage pt that her care is still on going while still in the ER. Pt reports "I don't believe these people, they don't care". Again tried to reassure pt that we care about her health and want to try our best to give her the best care possible. Pt rolled her eyes and turned away from RN while in bed. This nurse will continue to monitor pt, care on going, will follow orders.

## 2022-05-23 NOTE — ED Notes (Signed)
Message sent to attending Dr. Nevada Crane regarding pt's refusal of morning labs and HR 120s, awaiting response.

## 2022-05-23 NOTE — ED Notes (Signed)
Dr. Nevada Crane also notified that pt complaint of can't feel her legs, however she is able to move them freely on command while lying on stretcher and can feel this RN when I touch them when asked, she st yes.

## 2022-05-23 NOTE — ED Notes (Signed)
Reviewed pt's Prn medications, pt refused oxycodone and Tylenol, agreed to 0.5 mg of dilaudid. Pt stated "I have chronic pain, that little 5 mg oxy ain't doing nothing, I take 15 mg at home". Advised pt can only give what's order, pt agree to Dilaudid.

## 2022-05-23 NOTE — Progress Notes (Signed)
Notified MD Pokhrel of patients ongoing CP stated as 5/10 after 2 sublingual nitro and pt HR sustaining 120's. Hx of cabg 1 mo ago per patient. BP elevated mostly diastolic staying > 824. No orders at this time, per md pt may be placed on cardizem drip. Will monitor, patient just fell asleep and is stable at this time.

## 2022-05-23 NOTE — Progress Notes (Signed)
PROGRESS NOTE    Stepheni Cameron  VOJ:[id number] DOB: [birth year] DOA: 05/22/2022 PCP: [name]    Brief Narrative:  Edgar Corrigan is a 61 y.o. female with medical history significant for severe mitral insufficiency and tricuspid insufficiency status post mitral valve and tricuspid valve repair on 04/19/2022 by Dr. Roxan Hockey, recent large pericardial effusion with tamponade physiology status post pericardiocentesis on 05/10/2022 paroxysmal A-fib on Coumadin, essential hypertension, hyperlipidemia, prediabetes, GERD, chronic anxiety/depression, chronic normocytic anemia, hepatitis C, tobacco use, chronic pain syndrome, presented to the hospital after sustaining a fall at home ground-level with impact on the left lower extremity.  In the ED patient had acute kidney injury.  X-ray of the pelvis without any fractures.  MRI of the pelvis was performed which showed  Extensive muscle strain with low-grade tearing involving the left obturator externus muscle without tendon disruption or intramuscular hematoma.  Muscle strain with low-grade tear involving the inferior aspect of the distal left gluteus minimus muscle.  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  Degenerative facet arthropathy of L4-5 and L5-S1 with associated reactive subchondral bone marrow edema.  Initial vitals were stable except for mild tachypnea.  Labs showed creatinine of 2.0 and hemoglobin of 10.1.  Patient received Ringer lactate, fentanyl morphine and was considered for admission to hospital for further evaluation and treatment.  Assessment and plan. Principal Problem:   AKI (acute kidney injury) ([place]) Active Problems:   Type 2 diabetes mellitus without complication, without long-term current use of insulin (HCC)   Chronic pain syndrome   Depression   DDD (degenerative disc disease), lumbar   History of atrial fibrillation   Hypercholesterolemia   DOE (dyspnea on exertion)   AKI on diuretics due  to chronic systolic CHF Baseline creatinine around 0.9.  Creatinine of 2.0 on presentation.  Was on diuretics at home which is on hold.  Received gentle IV fluids.  We will continue to hold diuretic.  Strict intake and output charting Daily weights.  Check BMP in AM.    Shortness of breath chest discomfort.  Will get EKG troponins.  Patient had a recent 2D echocardiogram on 05/13/2022 with LV ejection fraction 45 to 50% with regional wall motion abnormality on the left ventricle.    Muscular strain with low-grade tear seen on noncontrast MRI pelvis Extensive muscle strain with low-grade tearing involving the left obturator externus muscle without tendon disruption or intramuscular hematoma.  Muscle strain with low-grade tear involving the inferior aspect of the distal left gluteus minimus muscle.  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  Degenerative facet arthropathy of L4-5 and L5-S1 with associated reactive subchondral bone marrow edema.  Continue conservative treatment including analgesia.  PT OT evaluation.  Continue muscle relaxant, oxycodone and as needed Dilaudid.  CK level of 153.   Mild left knee effusion, POA Continue supportive care.  Ice packs.   Essential hypertension Continue metoprolol, Cardizem from home   Hyperlipidemia Continue statins   Chronic anxiety/depression Continue Prozac gabapentin from home.  On Atarax as well.   Status post recent mitral valve and tricuspid valve repair 04/19/2022 Paroxysmal A-fib on Coumadin  Subtherapeutic INR 1.7 percent.  On Coumadin at home.  Pharmacy to dose.   Recent large pericardial effusion status post pericardiocentesis Follows with cardiology outpatient  Diabetes mellitus type 2..  Latest hemoglobin A1c of 6.1. on diabetic diet.  Will consider sliding scale insulin as needed.   Chronic systolic CHF 99-37% Holding Lasix for now.  Received some IV fluid.  Continue intake and output charting  Falls, debility  weakness.  We will get PT evaluation.      DVT prophylaxis:   Coumadin   Code Status:     Code Status: Full Code  Disposition:  Home with home health  Status is: Observation  The patient will require care spanning > 2 midnights and should be moved to inpatient because: Pending clinical improvement, ambulatory dysfunction, chest pain, multiple comorbidities,   Family Communication: Communicated with the patient at bedside.  Consultants:  None  Procedures:  None  Antimicrobials:  None  Anti-infectives (From admission, onward)    None       Subjective: Today, patient was seen and examined at bedside.  Patient complains of chest discomfort as tightness with mild shortness of breath.  States that she cannot use her left leg due to pain.  Feels anxious and tearful  Objective: Vitals:   05/23/22 0800 05/23/22 0832 05/23/22 0900 05/23/22 0915  BP: (!) 166/106 (!) 166/106 (!) 154/121   Pulse: 93 (!) 128 (!) 115 (!) 127  Resp: '14 18 18 17  '$ Temp:  98.3 F (36.8 C)    TempSrc:  Oral    SpO2: 98% 97% 99% 97%  Weight:      Height:        Intake/Output Summary (Last 24 hours) at 05/23/2022 1005 Last data filed at 05/23/2022 0449 Gross per 24 hour  Intake 500 ml  Output --  Net 500 ml   Filed Weights   05/22/22 1200  Weight: 96.2 kg    Physical Examination: Body mass index is 29.57 kg/m.   General:  Average built, not in obvious distress, anxious tearful HENT:   No scleral pallor or icterus noted. Oral mucosa is moist.  Chest:  Clear breath sounds.  Diminished breath sounds bilaterally. No crackles or wheezes.  CVS: S1 &S2 heard. No murmur.  Regular rate and rhythm. Abdomen: Soft, nontender, nondistended.  Bowel sounds are heard.   Extremities: No cyanosis, clubbing or edema.  Left lower extremity weakness.  Able to wiggle toes, capillary refill present Psych: Alert, awake and oriented, anxious and tearful. CNS:  No cranial nerve deficits.  Lower extremity  weakness likely due to pain.  Able to wiggle toes.  Capillary refill present Skin: Warm and dry.  No rashes noted.  Data Reviewed:   CBC: Recent Labs  Lab 05/17/22 0606 05/22/22 1531  WBC 4.7 6.3  NEUTROABS  --  4.3  HGB 8.9* 10.1*  HCT 27.4* 32.5*  MCV 88.7 90.3  PLT 204 096    Basic Metabolic Panel: Recent Labs  Lab 05/22/22 1531  NA 138  K 4.1  CL 107  CO2 22  GLUCOSE 127*  BUN 19  CREATININE 2.07*  CALCIUM 7.5*    Liver Function Tests: Recent Labs  Lab 05/22/22 1531  AST 19  ALT 11  ALKPHOS 119  BILITOT 0.5  PROT 6.3*  ALBUMIN 3.5     Radiology Studies: DG Chest 1 View  Result Date: 05/22/2022 CLINICAL DATA:  Evaluate for pulmonary edema. EXAM: CHEST  1 VIEW COMPARISON:  Chest radiograph dated 05/20/2022. FINDINGS: There is cardiomegaly with vascular congestion and mild edema, progressed since the prior radiograph. Superimposed pneumonia is not excluded. Clinical correlation is recommended. No focal consolidation, pleural effusion or pneumothorax. Mitral bowel repair. Median sternotomy wires. No acute osseous pathology. IMPRESSION: Cardiomegaly with vascular congestion and mild edema, progressed since the prior radiograph. Electronically Signed   By: Laren Everts.D.  On: 05/22/2022 21:34   DG Knee 1-2 Views Left  Result Date: 05/22/2022 CLINICAL DATA:  Fall and trauma to the left knee. EXAM: LEFT KNEE - 1-2 VIEW COMPARISON:  Left lower extremity radiograph dated 05/22/2022. FINDINGS: There is no acute fracture or dislocation. The bones are well mineralized. Mild arthritic changes of the left knee. No joint effusion. The soft tissues are unremarkable. IMPRESSION: No acute fracture or dislocation. Electronically Signed   By: Anner Crete M.D.   On: 05/22/2022 20:11   CT Head Wo Contrast  Result Date: 05/22/2022 CLINICAL DATA:  Polytrauma, blunt fall on coumadin; Polytrauma, blunt EXAM: CT HEAD WITHOUT CONTRAST CT CERVICAL SPINE WITHOUT CONTRAST  TECHNIQUE: Multidetector CT imaging of the head and cervical spine was performed following the standard protocol without intravenous contrast. Multiplanar CT image reconstructions of the cervical spine were also generated. RADIATION DOSE REDUCTION: This exam was performed according to the departmental dose-optimization program which includes automated exposure control, adjustment of the mA and/or kV according to patient size and/or use of iterative reconstruction technique. COMPARISON:  None Available. FINDINGS: CT HEAD FINDINGS Brain: No evidence of large-territorial acute infarction. No parenchymal hemorrhage. No mass lesion. No extra-axial collection. No mass effect or midline shift. No hydrocephalus. Basilar cisterns are patent. Vascular: No hyperdense vessel. Skull: No acute fracture or focal lesion. Sinuses/Orbits: Paranasal sinuses and mastoid air cells are clear. The orbits are unremarkable. Other: None. CT CERVICAL SPINE FINDINGS Alignment: Straightening of normal cervical lordosis likely due to positioning and degenerative changes. Skull base and vertebrae: Multilevel degenerative changes of the spine. No associated severe osseous neural foraminal or central canal stenosis. No acute fracture. No aggressive appearing focal osseous lesion or focal pathologic process. Soft tissues and spinal canal: No prevertebral fluid or swelling. No visible canal hematoma. Upper chest: Partially visualized bilateral pleural effusions. Interlobular septal wall thickening. Other: None. IMPRESSION: 1. No acute intracranial abnormality. 2. No acute displaced fracture or traumatic listhesis of the cervical spine. 3. Partially visualized bilateral pleural effusions. Recommend PA and lateral view of the chest for further evaluation. 4. Likely pulmonary edema. Electronically Signed   By: Iven Finn M.D.   On: 05/22/2022 19:51   CT Cervical Spine Wo Contrast  Result Date: 05/22/2022 CLINICAL DATA:  Polytrauma, blunt fall  on coumadin; Polytrauma, blunt EXAM: CT HEAD WITHOUT CONTRAST CT CERVICAL SPINE WITHOUT CONTRAST TECHNIQUE: Multidetector CT imaging of the head and cervical spine was performed following the standard protocol without intravenous contrast. Multiplanar CT image reconstructions of the cervical spine were also generated. RADIATION DOSE REDUCTION: This exam was performed according to the departmental dose-optimization program which includes automated exposure control, adjustment of the mA and/or kV according to patient size and/or use of iterative reconstruction technique. COMPARISON:  None Available. FINDINGS: CT HEAD FINDINGS Brain: No evidence of large-territorial acute infarction. No parenchymal hemorrhage. No mass lesion. No extra-axial collection. No mass effect or midline shift. No hydrocephalus. Basilar cisterns are patent. Vascular: No hyperdense vessel. Skull: No acute fracture or focal lesion. Sinuses/Orbits: Paranasal sinuses and mastoid air cells are clear. The orbits are unremarkable. Other: None. CT CERVICAL SPINE FINDINGS Alignment: Straightening of normal cervical lordosis likely due to positioning and degenerative changes. Skull base and vertebrae: Multilevel degenerative changes of the spine. No associated severe osseous neural foraminal or central canal stenosis. No acute fracture. No aggressive appearing focal osseous lesion or focal pathologic process. Soft tissues and spinal canal: No prevertebral fluid or swelling. No visible canal hematoma. Upper chest: Partially visualized bilateral  pleural effusions. Interlobular septal wall thickening. Other: None. IMPRESSION: 1. No acute intracranial abnormality. 2. No acute displaced fracture or traumatic listhesis of the cervical spine. 3. Partially visualized bilateral pleural effusions. Recommend PA and lateral view of the chest for further evaluation. 4. Likely pulmonary edema. Electronically Signed   By: Iven Finn M.D.   On: 05/22/2022 19:51    MR PELVIS WO CONTRAST  Result Date: 05/22/2022 CLINICAL DATA:  Hip trauma, fracture suspected, xray done EXAM: MRI PELVIS WITHOUT CONTRAST TECHNIQUE: Multiplanar multisequence MR imaging of the pelvis was performed. No intravenous contrast was administered. COMPARISON:  Same-day x-ray and CT FINDINGS: Bones/Joint/Cartilage No acute fracture. No dislocation. No femoral head avascular necrosis. 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 No hip joint effusion. Bony pelvis intact without diastasis. SI joints and pubic symphysis within normal limits. Degenerative facet arthropathy of L4-5 and L5-S1 with associated reactive subchondral bone marrow edema. Elsewhere, no bone marrow edema. No marrow replacing bone lesion. Ligaments Intact. Muscles and Tendons Strain with low-grade muscle tear involving the inferior aspect of the distal left gluteus minimus muscle (series 9, image 29). Distal tendon intact. Extensive muscle strain with low-grade tearing involving the left obturator externus muscle (series 9, image 38) without tendon disruption or intramuscular hematoma. No additional acute musculotendinous findings. Soft tissues Mild nonspecific subcutaneous edema. No organized fluid collection or hematoma. No inguinal lymphadenopathy. Small volume free fluid within the pelvis. IMPRESSION: 1. No acute osseous abnormality of the pelvis. Specifically, no fracture of the left hip. 2. Extensive muscle strain with low-grade tearing involving the left obturator externus muscle without tendon disruption or intramuscular hematoma. 3. Muscle strain with low-grade tear involving the inferior aspect of the distal left gluteus minimus muscle. 4. 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 5. Degenerative facet arthropathy of L4-5 and L5-S1 with associated reactive subchondral bone marrow edema. Electronically Signed   By: Davina Poke D.O.   On: 05/22/2022 19:39   DG Knee 2 Views Left  Result Date:  05/22/2022 CLINICAL DATA:  Fall.  Knee pain and swelling. EXAM: LEFT KNEE - 2 VIEW COMPARISON:  Left knee radiographs 08/24/2011 FINDINGS: Progressive degenerative changes are present within the medial compartment left knee. No acute fracture dislocation is present. No significant effusion is present. Soft tissues are unremarkable. IMPRESSION: 1. 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 2. No acute abnormality. Electronically Signed   By: San Morelle M.D.   On: 05/22/2022 16:22   DG Femur 1 View Left  Result Date: 05/22/2022 CLINICAL DATA:  Fall.  Knee pain and swelling. EXAM: LEFT FEMUR 1 VIEW COMPARISON:  None Available. FINDINGS: AP images demonstrate no acute fracture. Degenerative changes are present in the knee. IMPRESSION: 1. No acute abnormality. 2. Degenerative changes of the knee. Electronically Signed   By: San Morelle M.D.   On: 05/22/2022 16:20   CT Hip Left Wo Contrast  Result Date: 05/22/2022 CLINICAL DATA:  Hip trauma, fracture suspected, xray done EXAM: CT OF THE LEFT HIP WITHOUT CONTRAST TECHNIQUE: Multidetector CT imaging of the left hip was performed according to the standard protocol. Multiplanar CT image reconstructions were also generated. RADIATION DOSE REDUCTION: This exam was performed according to the departmental dose-optimization program which includes automated exposure control, adjustment of the mA and/or kV according to patient size and/or use of iterative reconstruction technique. COMPARISON:  X-ray 05/22/2022 FINDINGS: Bones/Joint/Cartilage No acute fracture. No dislocation. No evidence of femoral head avascular necrosis. Mild left hip osteoarthritis with joint space narrowing  and small marginal osteophyte formation. No appreciable hip joint effusion. Included portion of the left hemipelvis is intact without evidence of fracture or diastasis. No suspicious lytic or sclerotic bone lesion. Ligaments Suboptimally assessed by  CT. Muscles and Tendons No acute musculotendinous abnormality by CT. Soft tissues No fluid collection or hematoma.  No left inguinal lymphadenopathy. IMPRESSION: No acute fracture or dislocation of the left hip. Electronically Signed   By: Davina Poke D.O.   On: 05/22/2022 15:22   DG Hip Unilat W or Wo Pelvis 2-3 Views Left  Result Date: 05/22/2022 CLINICAL DATA:  Acute LEFT hip pain following fall. Initial encounter. EXAM: DG HIP (WITH OR WITHOUT PELVIS) 2-3V LEFT COMPARISON:  12/18/2020 CT FINDINGS: There is no evidence of hip fracture or dislocation. There is no evidence of arthropathy or other focal bone abnormality. IMPRESSION: Negative. Electronically Signed   By: Margarette Canada M.D.   On: 05/22/2022 13:17      LOS: 0 days    Flora Lipps, MD [facility] Available via Epic secure chat 7am-7pm After these hours, please refer to coverage provider listed on [email or web address] 05/23/2022, 10:05 AM

## 2022-05-23 NOTE — ED Notes (Signed)
Pt c/o central CP, describes as "heavy" and "pressure," rates 8/10. Pt endorses mild SOB, denies dizziness, nausea. Dr. Louanne Belton made aware.  New order for EKG obtained.

## 2022-05-23 NOTE — ED Notes (Signed)
Pt refusing to wear monitoring devices, took BP cuff and pulse ox off herself, RN reapplied, tried to encourage to leave on so we may monitor her vitals and her Afib. Cardiac monitor is still applied at this time. MD Nevada Crane aware

## 2022-05-23 NOTE — Progress Notes (Signed)
ANTICOAGULATION CONSULT NOTE - Follow up [facility] for Warfarin Indication: atrial fibrillation  Allergies  Allergen Reactions   Hydrocodone-Acetaminophen Itching   Hydrocodone Itching    Tolerates oxycodone     Patient Measurements: Height: '5\' 11"'$  (180.3 cm) Weight: 96.2 kg (212 lb) IBW/kg (Calculated) : 70.8  Vital Signs: Temp: 97.9 F (36.6 C) (06/18 1009) Temp Source: Axillary (06/18 1009) BP: 147/102 (06/18 1215) Pulse Rate: 71 (06/18 1215)  Labs: Recent Labs    05/22/22 1531 05/22/22 1727 05/22/22 1727 05/22/22 2200 05/23/22 0917 05/23/22 1025 05/23/22 1123  HGB 10.1*  --   --   --   --  10.4*  --   HCT 32.5*  --   --   --   --  32.4*  --   PLT 202  --   --   --   --  215  --   LABPROT  --  19.7*  --   --   --  20.0*  --   INR  --  1.7*  --   --   --  1.7*  --   CREATININE 2.07*  --   --   --   --   --   --   CKTOTAL  --  153  --   --   --   --   --   TROPONINIHS  --  11   < > 12 13  --  14   < > = values in this interval not displayed.     Estimated Creatinine Clearance: 36 mL/min (A) (by C-G formula based on SCr of 2.07 mg/dL (H)).   Medical History: Past Medical History:  Diagnosis Date   Acute pericardial effusion 05/17/2022   AKI (acute kidney injury) ([place]) 05/17/2022   Arthritis    Cervicalgia    Chondromalacia of patella    Chronic neck pain    Chronic pain syndrome    Depression    secondary to loss of her son at age 84   Diabetes mellitus    Diabetic neuropathy ([place])    DMII (diabetes mellitus, type 2) ([place])    Enthesopathy of hip region    Fibromyalgia    Hep C w/o coma, chronic ([place])    Hepatitis C    diagnosed 2005   Hypertension    Insomnia    Insomnia    Low back pain    Lumbago    Mitral regurgitation    Obesity    Pain in joint, upper arm    Primary localized osteoarthrosis, lower leg    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   Substance abuse ([place])    sober since 2001   Tobacco abuse     Tricuspid regurgitation    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   Past Week   albuterol (PROVENTIL HFA;VENTOLIN HFA) 108 (90 Base) MCG/ACT inhaler Inhale 2 puffs into the lungs every 6 (six) hours as needed for wheezing or shortness of breath.    Past Week   aspirin 81 MG EC tablet Take 81 mg by mouth daily.   05/21/2022   atorvastatin (LIPITOR) 20 MG tablet Take 20 mg by mouth daily.   05/21/2022   buPROPion (WELLBUTRIN XL) 300 MG 24 hr tablet Take  300 mg by mouth in the morning.   05/21/2022   colchicine 0.6 MG tablet Take 1 tablet (0.6 mg total) by mouth daily. 60 tablet 2 05/21/2022   Cyanocobalamin (VITAMIN B 12 PO) Take 500 mcg by mouth daily.   Past Week   diltiazem (CARDIZEM CD) 300 MG 24 hr capsule Take 1 capsule (300 mg total) by mouth daily. 30 capsule 6 05/21/2022   ferrous [id number] C-folic acid (TRINSICON / FOLTRIN) capsule Take 1 capsule by mouth 2 (two) times daily after a meal.   Past Week   FLUoxetine (PROZAC) 20 MG capsule Take 20 mg by mouth every morning.   05/21/2022   furosemide (LASIX) 40 MG tablet Take 40 mg by mouth 2 (two) times daily.   05/21/2022   gabapentin (NEURONTIN) 600 MG tablet Take 600 mg by mouth 3 (three) times daily.   05/21/2022   hydrOXYzine (ATARAX) 25 MG tablet Take 25 mg by mouth every 4 (four) hours as needed for anxiety.   05/21/2022   linagliptin (TRADJENTA) 5 MG TABS tablet Take 5 mg by mouth in the morning.   05/21/2022   methocarbamol (ROBAXIN) 500 MG tablet Take 500 mg by mouth 4 (four) times daily.   05/21/2022   metoprolol tartrate (LOPRESSOR) 50 MG tablet Take 1 tablet (50 mg total) by mouth 2 (two) times daily. 60 tablet 6 05/21/2022 at 1300   nystatin ointment (MYCOSTATIN) Apply topically 2 (two) times daily.   Past Month   omeprazole  (PRILOSEC) 20 MG capsule Take 20 mg by mouth daily.   05/21/2022   oxyCODONE (ROXICODONE) 15 MG immediate release tablet Take 15 mg by mouth every 4 (four) hours as needed for pain.   Past Week   PREMARIN 0.45 MG tablet Take 0.45 mg by mouth daily.   05/21/2022   traZODone (DESYREL) 100 MG tablet Take 2.5 tablets (250 mg total) by mouth at bedtime as needed for sleep. 225 tablet 0 Past Week   warfarin (COUMADIN) 5 MG tablet Take 1 tablet (5 mg total) by mouth daily.   05/21/2022 at [place] 1 each by Does not apply route daily. Check blood sugar once daily. 250.00 102 each 4    glucose blood (ACCU-CHEK SMARTVIEW) test strip Check blood sugar once daily. 250.00 50 each 12    naloxone (NARCAN) nasal spray 4 mg/0.1 mL Place 1 spray into the nose once.      Scheduled:   aspirin EC  81 mg Oral Daily   atorvastatin  20 mg Oral Daily   buPROPion  300 mg Oral q AM   colchicine  0.6 mg Oral Daily   diltiazem  300 mg Oral Daily   estrogens (conjugated)  0.45 mg Oral Daily   ferrous [id number] C-folic acid  1 capsule Oral BID PC   FLUoxetine  20 mg Oral q morning   gabapentin  300 mg Oral TID   methocarbamol  500 mg Oral QID   metoprolol tartrate  50 mg Oral BID   pantoprazole  40 mg Oral Daily   warfarin  7.5 mg Oral ONCE-1600   Warfarin - Pharmacist Dosing Inpatient   Does not apply q1600   Infusions:  PRN: acetaminophen, albuterol, HYDROmorphone (DILAUDID) injection, hydrOXYzine, melatonin, nitroGLYCERIN, oxyCODONE, polyethylene glycol, traZODone  Assessment: 61 yof with a history of T2DM, Hep C, tobacco use, chronic pain, severe mitral & tricuspic insufficiency, s/p MVR and tricuspid valve repair, atrial flutter, s/p pericardiocentesis on 6/5 (for which KCentra given).  Pericardial drain removed 6/10. Patient presenting with fall. Warfarin per pharmacy consult placed for AF.  Chart review reveals on warfarin '5mg'$  daily per last discharge on 6/13. Does not  appear has been able to have INR checked since discharge. Last taken 6/16 per patient.  INR remains unchanged and subtherapeutic at 1.7. Hgb stable at 10.4, PLT WNL 215. Will increase today's warfarin dose. May be able to resume PTA dosing once INR therapeutic.   Goal of Therapy:  INR 2-3 Monitor platelets by anticoagulation protocol: Yes   Plan:  Warfarin 7.5 mg x1 Watch for s/s of bleeding and daily INR/CBC May need further social work assistance for setting up proper INR f/u post-discharge  Joseph Art, Pharm.D. PGY-1 Pharmacy Resident [phone] 05/23/2022 1:18 PM

## 2022-05-23 NOTE — Progress Notes (Signed)
CCMD notified this RN of NSVT on telemetry.  Pt had a 25-second run of VT while sleeping.  She is asymptomatic.  Dr. Velia Meyer notified via text page.  Will continue to monitor.  Jodell Cipro

## 2022-05-23 NOTE — Progress Notes (Signed)
OT Cancellation Note  Patient Details Name: [name] MRN: [id number] DOB: Feb 05, 1961   Cancelled Treatment:    Reason Eval/Treat Not Completed: Patient not medically ready (Per discussion with RN, OT evaluation to hold for today due to chest pain.)  Sherryll Skoczylas A Srinika Delone 05/23/2022, 3:18 PM

## 2022-05-23 NOTE — Evaluation (Signed)
Physical Therapy Evaluation Patient Details Name: [name] MRN: [id number] DOB: November 02, 1961 Today's Date: 05/23/2022  History of Present Illness  61 y.o. female presents to [facility] hospital on 05/22/2022 after fall at home. Work-up reveals AKI and  low-grade tearing involving the left obturator externus muscle. 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  Clinical Impression  Pt presents to PT with deficits in strength, power, endurance, gait, balance. Pt is limited by pain in LLE along with weakness. Mobility of LLE improves some during session and pt is able to ambulate for limited household distances. PT encourages frequent mobilization in an effort to improve strength and gait quality and to reduce pain. Pt will benefit from further acute PT services but PT anticipates she will progress to being able to discharge home with HHPT.       Recommendations for follow up therapy are one component of a multi-disciplinary discharge planning process, led by the attending physician.  Recommendations may be updated based on patient status, additional functional criteria and insurance authorization.  Follow Up Recommendations Home health PT    Assistance Recommended at Discharge Intermittent Supervision/Assistance  Patient can return home with the following  A little help with walking and/or transfers;A little help with bathing/dressing/bathroom;Assistance with cooking/housework;Help with stairs or ramp for entrance    Equipment Recommendations Rolling walker (2 wheels) (pending progress with mobility)  Recommendations for Other Services       Functional Status Assessment Patient has had a recent decline in their functional status and demonstrates the ability to make significant improvements in function in a reasonable and predictable amount of time.     Precautions / Restrictions  Precautions Precautions: Fall Restrictions Weight Bearing Restrictions: No      Mobility  Bed Mobility Overal bed mobility: Needs Assistance Bed Mobility: Supine to Sit, Sit to Supine     Supine to sit: Supervision, HOB elevated Sit to supine: Min assist   General bed mobility comments: increased time for sup to sit, pt with difficulty mobilizing LLE    Transfers Overall transfer level: Needs assistance Equipment used: 1 person hand held assist Transfers: Sit to/from Stand Sit to Stand: Min assist           General transfer comment: face to face assist initially due to pt fear of LLE instability.    Ambulation/Gait Ambulation/Gait assistance: Min guard Gait Distance (Feet): 25 Feet Assistive device: Rolling walker (2 wheels) Gait Pattern/deviations: Step-to pattern Gait velocity: reduced Gait velocity interpretation: <1.31 ft/sec, indicative of household ambulator   General Gait Details: pt with slowed step-to gait, reduced stance time on LLE  Stairs            Wheelchair Mobility    Modified Rankin (Stroke Patients Only)       Balance Overall balance assessment: Needs assistance Sitting-balance support: No upper extremity supported, Feet supported Sitting balance-Leahy Scale: Good     Standing balance support: Bilateral upper extremity supported, Reliant on assistive device for balance Standing balance-Leahy Scale: Poor                               Pertinent Vitals/Pain Pain Assessment Pain Assessment: 0-10 Pain Score: 5  Pain Location: bilateral shoulders, low back, L hip Pain Descriptors / Indicators: Sore Pain Intervention(s): Monitored during session    Home Living Family/patient expects to be discharged to:: Private residence Living Arrangements: Alone  Available Help at Discharge: Available PRN/intermittently Type of Home: Apartment Home Access: Level entry       Home Layout: One level Home Equipment: Rollator (4  wheels);Cane - single point;BSC/3in1;Wheelchair - manual;Grab bars - tub/shower;Shower seat      Prior Function Prior Level of Function : Independent/Modified Independent             Mobility Comments: pt reports she has progressed to ambulating without a device since last admission       Hand Dominance   Dominant Hand: Right    Extremity/Trunk Assessment   Upper Extremity Assessment Upper Extremity Assessment: Overall WFL for tasks assessed    Lower Extremity Assessment Lower Extremity Assessment: LLE deficits/detail LLE Deficits / Details: grossly 3/5 based on observed mobility LLE Sensation: decreased light touch (pt with reduced sensation on anterior leg, reports functional sensation above knee and below ankle)    Cervical / Trunk Assessment Cervical / Trunk Assessment: Normal  Communication   Communication: No difficulties  Cognition Arousal/Alertness: Awake/alert Behavior During Therapy: WFL for tasks assessed/performed Overall Cognitive Status: Within Functional Limits for tasks assessed                                          General Comments General comments (skin integrity, edema, etc.): intermittent tachycardia into 130s, recovers quickly when resting. Pt does not report chest pain during session    Exercises     Assessment/Plan    PT Assessment Patient needs continued PT services  PT Problem List Decreased strength;Decreased activity tolerance;Decreased balance;Decreased mobility;Decreased knowledge of use of DME;Decreased safety awareness;Decreased knowledge of precautions;Pain       PT Treatment Interventions DME instruction;Gait training;Functional mobility training;Therapeutic activities;Therapeutic exercise;Balance training;Neuromuscular re-education;Patient/family education    PT Goals (Current goals can be found in the Care Plan section)  Acute Rehab PT Goals Patient Stated Goal: to return to independent mobility PT Goal  Formulation: With patient Time For Goal Achievement: 06/06/22 Potential to Achieve Goals: Good    Frequency Min 3X/week     Co-evaluation               AM-PAC PT "6 Clicks" Mobility  Outcome Measure Help needed turning from your back to your side while in a flat bed without using bedrails?: A Little Help needed moving from lying on your back to sitting on the side of a flat bed without using bedrails?: A Little Help needed moving to and from a bed to a chair (including a wheelchair)?: A Little Help needed standing up from a chair using your arms (e.g., wheelchair or bedside chair)?: A Little Help needed to walk in hospital room?: A Little Help needed climbing 3-5 steps with a railing? : Total 6 Click Score: 16    End of Session   Activity Tolerance: Patient tolerated treatment well Patient left: in bed;with call bell/phone within reach;with bed alarm set Nurse Communication: Mobility status PT Visit Diagnosis: Other abnormalities of gait and mobility (R26.89);Muscle weakness (generalized) (M62.81);Pain Pain - Right/Left: Left Pain - part of body: Leg    Time: 7322-0254 PT Time Calculation (min) (ACUTE ONLY): 22 min   Charges:   PT Evaluation $PT Eval Low Complexity: 1 Low          Zenaida Niece, PT, DPT Acute Rehabilitation Office [phone]   Zenaida Niece 05/23/2022, 5:38 PM

## 2022-05-23 NOTE — Hospital Course (Addendum)
[name] is a 61 y.o. female with medical history significant for severe mitral insufficiency and tricuspid insufficiency status post mitral valve and tricuspid valve repair on 04/19/2022 by Dr. Roxan Hockey, recent large pericardial effusion with tamponade physiology status post pericardiocentesis on 05/10/2022 paroxysmal A-fib on Coumadin, essential hypertension, hyperlipidemia, prediabetes, GERD, chronic anxiety/depression, chronic normocytic anemia, hepatitis C, tobacco use, chronic pain syndrome, presented to the hospital after sustaining a fall at home ground-level with impact on the left lower extremity.  In the ED, patient had acute kidney injury.  X-ray of the pelvis without any fractures.  MRI of the pelvis was performed which showed  extensive muscle strain with low-grade tearing involving the left obturator externus muscle without tendon disruption or intramuscular hematoma.  Muscle strain with low-grade tear involving the inferior aspect of the distal left gluteus minimus muscle.  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  Degenerative facet arthropathy of L4-5 and L5-S1 with associated reactive subchondral bone marrow edema.  Initial vitals were stable except for mild tachypnea.  Labs showed creatinine of 2.0 and hemoglobin of 10.1.  Patient received Ringer lactate, fentanyl, morphine and was considered for admission to hospital for further evaluation and treatment.  During hospitalization, cardiology was consulted due to chest discomfort recent cardiac surgery.  Cardiology adjusting medication.  CTA performed showed no evidence of PE but effusion and congestion.  Patient has been started on IV Lasix, check venous duplex.  Assessment and plan.  Principal Problem:   AKI (acute kidney injury) ([place]) Active Problems:   Type 2 diabetes mellitus without complication, without long-term current use of insulin (HCC)   Chronic pain syndrome   Depression   DDD (degenerative disc  disease), lumbar   History of atrial fibrillation   Hypercholesterolemia   DOE (dyspnea on exertion)   Acute kidney injury on diuretics due to chronic systolic CHF Baseline creatinine around 0.9.  Creatinine of 2.0 on presentation.   Received gentle IV fluids.  Latest creatinine of 1.1.  Cardiology has seen the patient and recommended initiation of IV Lasix during hospitalization due to CT scan showing bilateral effusion.  Plan for Lasix Lasix 20 mg daily on discharge.  Hypomagnesemia had episodes of non sustained tachycardia.  Magnesium of 1.3 initially and received magnesium sulfate IV and oral.  Magnesium of 1.5 today and has received magnesium sulfate IV again.  We will continue magnesium oxide as well.  Shortness of breath, chest discomfort.  Negative troponins.  EKG with atrial flutter.  Patient had a recent 2D echocardiogram on 05/13/2022 with LV ejection fraction 45 to 50% with regional wall motion abnormality on the left ventricle.  History of pericardiocentesis, mitral valve repair.  Patient was seen by cardiology during hospitalization and recommended increasing colchicine to twice a day.  Recommendation is 3 months of colchicine.  Dose of Cardizem has been increased to 360 mg daily due to hypotension.  D-dimer was elevated so CT angiogram of the chest was performed which was negative for PE but bilateral effusion.  Communicated with cardiology who recommends initiation of IV Lasix while in the hospital.  We will also get venous Doppler of the lower extremities.  She will need to follow-up with cardiology as outpatient on discharge..  Muscular strain with low-grade tear seen on noncontrast MRI pelvis Patient complains of difficulty ambulating and pain on the left leg.  MRI of the lower extremity showed extensive muscle strain with low-grade tearing involving the left obturator externus muscle without tendon disruption or intramuscular hematoma.  Muscle strain with low-grade tear involving the  inferior aspect of the distal left gluteus minimus muscle.  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  Degenerative facet arthropathy of L4-5 and L5-S1 with associated reactive subchondral bone marrow edema.  PT OT has recommended home health PT on discharge. Continue muscle relaxant, oxycodone on discharge.  CK level of 153.   Mild left knee effusion, POA Continue supportive care.  Ice packs. continue PT as outpatient.   Essential hypertension Continue metoprolol, Cardizem.  Cardizem dose has been increased at this time.   Hyperlipidemia Continue statins   Chronic anxiety/depression Continue Prozac, gabapentin and Atarax    Status post recent mitral valve and tricuspid valve repair 04/19/2022 Paroxysmal A-fib on Coumadin  INR of 2.7 today.  Resume Coumadin on discharge   History of large pericardial effusion status post pericardiocentesis Follows with cardiology ad outpatient.  On colchicine as outpatient.  Has been increased to twice a day and will need to continue for 3 more months.  Patient will follow-up with cardiology as outpatient.  Diabetes mellitus type 2.  Latest hemoglobin A1c of 6.1. on diabetic diet.  On linagliptin as outpatient.   Chronic systolic CHF 91-63% Will resume Lasix 20 mg daily on discharge.  Non sustained ventricular tachycardia.  Received magnesium IV.  No further episodes.  Falls, debility weakness.  PT evaluation has recommended home health PT on discharge.

## 2022-05-23 NOTE — ED Notes (Signed)
Pt still refusing morning labs

## 2022-05-24 ENCOUNTER — Observation Stay (HOSPITAL_COMMUNITY): Payer: Medicare Other

## 2022-05-24 DIAGNOSIS — I10 Essential (primary) hypertension: Secondary | ICD-10-CM

## 2022-05-24 DIAGNOSIS — R0609 Other forms of dyspnea: Secondary | ICD-10-CM | POA: Diagnosis not present

## 2022-05-24 DIAGNOSIS — F32A Depression, unspecified: Secondary | ICD-10-CM | POA: Diagnosis not present

## 2022-05-24 DIAGNOSIS — I340 Nonrheumatic mitral (valve) insufficiency: Secondary | ICD-10-CM

## 2022-05-24 DIAGNOSIS — M5136 Other intervertebral disc degeneration, lumbar region: Secondary | ICD-10-CM | POA: Diagnosis not present

## 2022-05-24 DIAGNOSIS — G894 Chronic pain syndrome: Secondary | ICD-10-CM | POA: Diagnosis not present

## 2022-05-24 DIAGNOSIS — I342 Nonrheumatic mitral (valve) stenosis: Secondary | ICD-10-CM | POA: Diagnosis not present

## 2022-05-24 DIAGNOSIS — I361 Nonrheumatic tricuspid (valve) insufficiency: Secondary | ICD-10-CM

## 2022-05-24 DIAGNOSIS — I5033 Acute on chronic diastolic (congestive) heart failure: Secondary | ICD-10-CM | POA: Diagnosis not present

## 2022-05-24 DIAGNOSIS — N179 Acute kidney failure, unspecified: Secondary | ICD-10-CM | POA: Diagnosis not present

## 2022-05-24 DIAGNOSIS — I4892 Unspecified atrial flutter: Secondary | ICD-10-CM | POA: Diagnosis not present

## 2022-05-24 DIAGNOSIS — I071 Rheumatic tricuspid insufficiency: Secondary | ICD-10-CM

## 2022-05-24 LAB — PROTIME-INR
INR: 2.2 — ABNORMAL HIGH (ref 0.8–1.2)
Prothrombin Time: 24.3 seconds — ABNORMAL HIGH (ref 11.4–15.2)

## 2022-05-24 LAB — CBC WITH DIFFERENTIAL/PLATELET
Abs Immature Granulocytes: 0.02 10*3/uL (ref 0.00–0.07)
Basophils Absolute: 0 10*3/uL (ref 0.0–0.1)
Basophils Relative: 0 %
Eosinophils Absolute: 0.2 10*3/uL (ref 0.0–0.5)
Eosinophils Relative: 3 %
HCT: 29.5 % — ABNORMAL LOW (ref 36.0–46.0)
Hemoglobin: 9.6 g/dL — ABNORMAL LOW (ref 12.0–15.0)
Immature Granulocytes: 0 %
Lymphocytes Relative: 24 %
Lymphs Abs: 1.7 10*3/uL (ref 0.7–4.0)
MCH: 28.2 pg (ref 26.0–34.0)
MCHC: 32.5 g/dL (ref 30.0–36.0)
MCV: 86.5 fL (ref 80.0–100.0)
Monocytes Absolute: 0.6 10*3/uL (ref 0.1–1.0)
Monocytes Relative: 9 %
Neutro Abs: 4.5 10*3/uL (ref 1.7–7.7)
Neutrophils Relative %: 64 %
Platelets: 230 10*3/uL (ref 150–400)
RBC: 3.41 MIL/uL — ABNORMAL LOW (ref 3.87–5.11)
RDW: 15.1 % (ref 11.5–15.5)
WBC: 7.2 10*3/uL (ref 4.0–10.5)
nRBC: 0 % (ref 0.0–0.2)

## 2022-05-24 LAB — ECHOCARDIOGRAM LIMITED
Height: 71 in
MV M vel: 5.11 m/s
MV Peak grad: 104.4 mmHg
Weight: 3344 oz

## 2022-05-24 LAB — BASIC METABOLIC PANEL
Anion gap: 8 (ref 5–15)
BUN: 12 mg/dL (ref 8–23)
CO2: 20 mmol/L — ABNORMAL LOW (ref 22–32)
Calcium: 7.8 mg/dL — ABNORMAL LOW (ref 8.9–10.3)
Chloride: 110 mmol/L (ref 98–111)
Creatinine, Ser: 1.14 mg/dL — ABNORMAL HIGH (ref 0.44–1.00)
GFR, Estimated: 54 mL/min — ABNORMAL LOW (ref >60)
Glucose, Bld: 77 mg/dL (ref 70–99)
Potassium: 4.1 mmol/L (ref 3.5–5.1)
Sodium: 138 mmol/L (ref 135–145)

## 2022-05-24 LAB — BRAIN NATRIURETIC PEPTIDE: B Natriuretic Peptide: 792 pg/mL — ABNORMAL HIGH (ref 0.0–100.0)

## 2022-05-24 LAB — MAGNESIUM: Magnesium: 1.3 mg/dL — ABNORMAL LOW (ref 1.7–2.4)

## 2022-05-24 MED ORDER — MAGNESIUM OXIDE -MG SUPPLEMENT 400 (240 MG) MG PO TABS
400.0000 mg | ORAL_TABLET | Freq: Two times a day (BID) | ORAL | Status: DC
  Administered 2022-05-24 – 2022-05-26 (×5): 400 mg via ORAL
  Filled 2022-05-24 (×5): qty 1

## 2022-05-24 MED ORDER — WARFARIN SODIUM 5 MG PO TABS
5.0000 mg | ORAL_TABLET | Freq: Once | ORAL | Status: AC
  Administered 2022-05-24: 5 mg via ORAL
  Filled 2022-05-24: qty 1

## 2022-05-24 MED ORDER — MAGNESIUM SULFATE 2 GM/50ML IV SOLN
2.0000 g | Freq: Once | INTRAVENOUS | Status: AC
  Administered 2022-05-24: 2 g via INTRAVENOUS
  Filled 2022-05-24: qty 50

## 2022-05-24 MED ORDER — FUROSEMIDE 10 MG/ML IJ SOLN
40.0000 mg | Freq: Once | INTRAMUSCULAR | Status: AC
  Administered 2022-05-24: 40 mg via INTRAVENOUS
  Filled 2022-05-24: qty 4

## 2022-05-24 NOTE — Evaluation (Signed)
Occupational Therapy Evaluation Patient Details Name: [name] MRN: [id number] DOB: 24-May-1961 Today's Date: 05/24/2022   History of Present Illness 61 y.o. female presents to [facility] hospital on 05/22/2022 after fall at home. Work-up reveals AKI and  low-grade tearing involving the left obturator externus muscle. 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   Clinical Impression   PTA pt lives alone independently and has limited support at DC. Currently requires minA for LB ADL and minguard A for mobility @ RW level. Max HR sustained @ 124 during activity. Acute OT will follow; recommend follow up [place] and [place].       Recommendations for follow up therapy are one component of a multi-disciplinary discharge planning process, led by the attending physician.  Recommendations may be updated based on patient status, additional functional criteria and insurance authorization.   Follow Up Recommendations  Home health OT ; [place] Recommended at Discharge Intermittent Supervision/Assistance  Patient can return home with the following Assistance with cooking/housework    Functional Status Assessment  Patient has had a recent decline in their functional status and demonstrates the ability to make significant improvements in function in a reasonable and predictable amount of time.  Equipment Recommendations  None recommended by OT    Recommendations for Other Services       Precautions / Restrictions Precautions Precautions: Fall;Other (comment) Precaution Comments: watch HR Restrictions Weight Bearing Restrictions: No LLE Weight Bearing: Weight bearing as tolerated      Mobility Bed Mobility Overal bed mobility: Needs Assistance Bed Mobility: Supine to Sit     Supine to sit: Min guard, HOB elevated Sit to supine: Min assist   General bed mobility comments: +rail,  increased time    Transfers Overall transfer level: Needs assistance Equipment used: Rolling walker (2 wheels) Transfers: Sit to/from Stand Sit to Stand: Min assist           General transfer comment: increased time, assist to power up      Balance Overall balance assessment: Needs assistance Sitting-balance support: No upper extremity supported, Feet supported Sitting balance-Leahy Scale: Good     Standing balance support: Bilateral upper extremity supported, Reliant on assistive device for balance, During functional activity Standing balance-Leahy Scale: Poor                             ADL either performed or assessed with clinical judgement   ADL Overall ADL's : Needs assistance/impaired     Grooming: Standing;Set up   Upper Body Bathing: Set up   Lower Body Bathing: Minimal assistance;Sit to/from stand   Upper Body Dressing : Set up   Lower Body Dressing: Minimal assistance;Sit to/from stand   Toilet Transfer: Min guard;Rolling walker (2 wheels);Ambulation   Toileting- Clothing Manipulation and Hygiene: Supervision/safety       Functional mobility during ADLs: Min guard;Rolling walker (2 wheels) General ADL Comments: vc on safe use of RW; pt states she ordered a fall alert system before [facility]     Vision         Perception     Praxis      Pertinent Vitals/Pain Pain Assessment Pain Assessment: 0-10 Pain Score: 8  Pain Location: L hip Pain Descriptors / Indicators: Grimacing, Guarding, Discomfort Pain Intervention(s): Limited activity within patient's tolerance, RN gave pain meds during session  Hand Dominance Right   Extremity/Trunk Assessment Upper Extremity Assessment Upper Extremity Assessment: Overall WFL for tasks assessed   Lower Extremity Assessment Lower Extremity Assessment: Defer to PT evaluation LLE Sensation:  (pt with reduced sensation on anterior leg, reports functional sensation  above knee and below ankle)   Cervical / Trunk Assessment Cervical / Trunk Assessment: Normal   Communication Communication Communication: No difficulties   Cognition Arousal/Alertness: Awake/alert Behavior During Therapy: WFL for tasks assessed/performed Overall Cognitive Status: Within Functional Limits for tasks assessed                                       General Comments  HR sustained 127-129 during amb. Intermittent episodes into 140s. Recovers quickly with rest. HR in 90s in recliner at end of session.    Exercises     Shoulder Instructions      Home Living Family/patient expects to be discharged to:: Private residence Living Arrangements: Alone Available Help at Discharge: Available PRN/intermittently Type of Home: Apartment Home Access: Level entry     Home Layout: One level     Bathroom Shower/Tub: Teacher, early years/pre: Standard Bathroom Accessibility: Yes How Accessible: Accessible via walker Home Equipment: Rollator (4 wheels);Cane - single point;BSC/3in1;Wheelchair - manual;Grab bars - tub/shower;Shower seat;Tub bench;Hand held shower head          Prior Functioning/Environment Prior Level of Function : Independent/Modified Independent;Driving             Mobility Comments: pt reports she has progressed to ambulating without a device since last admission          OT Problem List: Decreased strength;Decreased activity tolerance;Decreased range of motion;Impaired balance (sitting and/or standing);Decreased safety awareness;Decreased knowledge of use of DME or AE;Cardiopulmonary status limiting activity;Pain      OT Treatment/Interventions: Self-care/ADL training;Therapeutic exercise;DME and/or AE instruction;Energy conservation;Therapeutic activities;Patient/family education;Balance training    OT Goals(Current goals can be found in the care plan section) Acute Rehab OT Goals Patient Stated Goal: to be able to  take care of herself OT Goal Formulation: With patient Time For Goal Achievement: 06/07/22 Potential to Achieve Goals: Good  OT Frequency: Min 2X/week    Co-evaluation              AM-PAC OT "6 Clicks" Daily Activity     Outcome Measure Help from another person eating meals?: None Help from another person taking care of personal grooming?: A Little Help from another person toileting, which includes using toliet, bedpan, or urinal?: A Little Help from another person bathing (including washing, rinsing, drying)?: A Little Help from another person to put on and taking off regular upper body clothing?: A Little Help from another person to put on and taking off regular lower body clothing?: A Little 6 Click Score: 19   End of Session Equipment Utilized During Treatment: Gait belt;Rolling walker (2 wheels) Nurse Communication: Mobility status;Other (comment) (encourage ambulation to the bathroom)  Activity Tolerance: Patient tolerated treatment well Patient left: in bed;with call bell/phone within reach;with bed alarm set  OT Visit Diagnosis: Unsteadiness on feet (R26.81);History of falling (Z91.81);Muscle weakness (generalized) (M62.81);Pain Pain - Right/Left: Left Pain - part of body: Hip;Leg                Time: 2778-2423 OT Time Calculation (min): 17 min Charges:  OT General Charges $OT Visit: 1 Visit OT Evaluation $OT Eval Moderate Complexity: 1  [place], OT/L   Acute OT Clinical Specialist Acute Rehabilitation Services Pager [phone] Office [phone]   [facility] 05/24/2022, 12:37 PM

## 2022-05-24 NOTE — TOC Initial Note (Signed)
Transition of Care [facility]) - Initial/Assessment Note    Patient Details  Name: [name] MRN: [id number] Date of Birth: 06-03-1961  [facility]) CM/SW Contact:    Bethena Roys, RN Phone Number: 05/24/2022, 3:59 PM  Clinical Narrative:  Patient is currently active with [facility] for home health services- RN. Case Manager to add PT/OT,Aide services. [facility] is agreeable to add disciplines. Patient will need orders for services. Start of care to be resumed within 24-48 hours post transition home.  No further needs identified at this time.               Expected Discharge Plan: [place] Barriers to Discharge: No Barriers Identified   Patient Goals and CMS Choice Patient states their goals for this hospitalization and ongoing recovery are:: to return home.   Choice offered to / list presented to : Patient  Expected Discharge Plan and Services Expected Discharge Plan: [place]   Discharge Planning Services: CM Consult Post Acute Care Choice: Home Health, Resumption of Svcs/PTA Provider Living arrangements for the past 2 months: Apartment                 DME Arranged: N/A DME Agency: NA       HH Arranged: RN, Disease Management, PT, OT, Nurse's Aide [place] Agency: [facility] Date [facility] Agency Contacted: 05/24/22 Time HH Agency Contacted: 55 Representative spoke with at [facility]: Tommi Rumps  Prior Living Arrangements/Services Living arrangements for the past 2 months: Apartment Lives with:: Self Patient language and need for interpreter reviewed:: Yes Do you feel safe going back to the place where you live?: Yes      Need for Family Participation in Patient Care: Yes (Comment) Care giver support system in place?: Yes (comment)   Criminal Activity/Legal Involvement Pertinent to Current Situation/Hospitalization: No - Comment as needed  Activities of Daily Living Home Assistive Devices/Equipment:  Wheelchair, Radio producer (specify quad or straight) ADL Screening (condition at time of admission) Patient's cognitive ability adequate to safely complete daily activities?: Yes Is the patient deaf or have difficulty hearing?: No Does the patient have difficulty seeing, even when wearing glasses/contacts?: No Does the patient have difficulty concentrating, remembering, or making decisions?: No Patient able to express need for assistance with ADLs?: Yes Does the patient have difficulty dressing or bathing?: No Independently performs ADLs?: Yes (appropriate for developmental age) Communication: Independent Dressing (OT): Independent Grooming: Independent Feeding: Independent Bathing: Independent Toileting: Independent In/Out Bed: Independent Walks in Home: Independent Does the patient have difficulty walking or climbing stairs?: Yes Weakness of Legs: Both Weakness of Arms/Hands: None  Permission Sought/Granted Permission sought to share information with : Family Supports, Customer service manager, Case Optician, dispensing granted to share information with : Yes, Verbal Permission Granted     Permission granted to share info w AGENCY: [facility]        Emotional Assessment Appearance:: Appears stated age Attitude/Demeanor/Rapport: Engaged Affect (typically observed): Appropriate Orientation: : Oriented to Self, Oriented to Place, Oriented to Situation, Oriented to  Time Alcohol / Substance Use: Not Applicable Psych Involvement: No (comment)  Admission diagnosis:  Left hip pain [M25.552] AKI (acute kidney injury) ([place]) [N17.9] Fall, initial encounter [W19.XXXA] Patient Active Problem List   Diagnosis Date Noted   Hypomagnesemia 05/24/2022   Acute pericardial effusion s/p pericardiocentesis 05/10/22 05/17/2022   Anticoagulated 05/17/2022   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 05/17/2022  AKI (acute kidney injury) ([place])  05/17/2022   PAF (paroxysmal atrial fibrillation) (HCC)    Atrial flutter with rapid ventricular response (HCC)    Pericardial tamponade 05/10/2022   Hypotension 04/05/2022   Severe tricuspid regurgitation 04/05/2022   Severe mitral regurgitation 02/11/2022   DOE (dyspnea on exertion) 12/30/2021   Pain of left hand 01/01/2021   Nasal vestibulitis 04/25/2020   FH: CHF (congestive heart failure) 04/05/2020   Acquired hallux interphalangeus 02/26/2020   Hallux rigidus 02/26/2020   Retained orthopedic hardware 02/26/2020   Mechanical complication associated with orthopedic device ([place]) 08/31/2019   Hypercholesterolemia 08/17/2019   Lateral epicondylitis of right elbow 03/31/2018   Arthritis of knee 03/09/2018   Arthritis of foot, degenerative 02/28/2018   Hallux valgus (acquired), left foot 02/28/2018   Long term current use of oral hypoglycemic drug 01/16/2018   Pharyngeal dysphagia 12/21/2017   Left thyroid nodule 12/21/2017   History of colon polyps 09/21/2017   History of atrial fibrillation 08/23/2017   GERD (gastroesophageal reflux disease) 08/23/2017   Presbyopia of both eyes 07/15/2017   Nuclear sclerotic cataract of both eyes 07/15/2017   Dry eye syndrome of both lacrimal glands 07/15/2017   Acquired trigger finger of left ring finger 05/04/2017   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 01/17/2017   Postural dizziness with presyncope 12/30/2016   Paroxysmal atrial fibrillation ([place]) 12/30/2016   Osteoarthritis 12/30/2016   NSAID long-term use 12/30/2016   Anemia, blood loss 12/30/2016   DDD (degenerative disc disease), lumbar 11/26/2015   Facet syndrome, lumbar 11/26/2015   Greater trochanteric bursitis 11/26/2015   Diabetic neuropathy ([place]) 11/26/2015   Insomnia 11/11/2015   PTSD (post-traumatic stress disorder) 11/11/2015   GAD (generalized anxiety disorder) 11/11/2015   Severe episode of recurrent major depressive disorder, without psychotic features ([place]) 11/11/2015    Depression 11/11/2015   Obesity 06/27/2012   Tobacco use 06/27/2012   Sacroiliac joint dysfunction 05/18/2012   Right low back pain 04/19/2012   Cervical neck pain with evidence of disc disease 02/11/2012   Trochanteric bursitis of right hip 02/11/2012   Plantar fasciitis 03/11/2011   INSOMNIA 01/18/2008   Chronic pain syndrome 12/15/2007   DIABETIC PERIPHERAL NEUROPATHY 05/08/2007   HEPATITIS C 02/27/2007   Type 2 diabetes mellitus without complication, without long-term current use of insulin ([place]) 02/27/2007   DEPRESSION 02/27/2007   Hypertension associated with diabetes ([place]) 02/27/2007   PCP:  [place], [place]:   [facility], [place] - [address] [address] Phone: [phone] Fax: [place], [place]  [address] Phone: [phone] Fax: [place], [place] - [address] [place] [address] Phone: [phone] Fax: [phone]  [facility] [address] [place] [address] Phone: [phone] Fax: [phone]  Readmission Risk Interventions     No data to display

## 2022-05-24 NOTE — Progress Notes (Signed)
Echocardiogram 2D Echocardiogram has been performed.  Oneal Deputy Ryzen Deady RDCS 05/24/2022, 3:51 PM

## 2022-05-24 NOTE — Progress Notes (Addendum)
PROGRESS NOTE    [name]  KPV:[id number] DOB: 08-31-1961 DOA: 05/22/2022 PCP: [name]    Brief Narrative:  [name] is a 61 y.o. female with medical history significant for severe mitral insufficiency and tricuspid insufficiency status post mitral valve and tricuspid valve repair on 04/19/2022 by Dr. Roxan Hockey, recent large pericardial effusion with tamponade physiology status post pericardiocentesis on 05/10/2022 paroxysmal A-fib on Coumadin, essential hypertension, hyperlipidemia, prediabetes, GERD, chronic anxiety/depression, chronic normocytic anemia, hepatitis C, tobacco use, chronic pain syndrome, presented to the hospital after sustaining a fall at home ground-level with impact on the left lower extremity.  In the ED, patient had acute kidney injury.  X-ray of the pelvis without any fractures.  MRI of the pelvis was performed which showed  Extensive muscle strain with low-grade tearing involving the left obturator externus muscle without tendon disruption or intramuscular hematoma.  Muscle strain with low-grade tear involving the inferior aspect of the distal left gluteus minimus muscle.  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  Degenerative facet arthropathy of L4-5 and L5-S1 with associated reactive subchondral bone marrow edema.  Initial vitals were stable except for mild tachypnea.  Labs showed creatinine of 2.0 and hemoglobin of 10.1.  Patient received Ringer lactate, fentanyl, morphine and was considered for admission to hospital for further evaluation and treatment.  Assessment and plan. Principal Problem:   AKI (acute kidney injury) ([place]) Active Problems:   Type 2 diabetes mellitus without complication, without long-term current use of insulin (HCC)   Chronic pain syndrome   Depression   DDD (degenerative disc disease), lumbar   History of atrial fibrillation   Hypercholesterolemia   DOE (dyspnea on exertion)   Acute kidney injury  on diuretics due to chronic systolic CHF Baseline creatinine around 0.9.  Creatinine of 2.0 on presentation.  Was on diuretics at home which is on hold.  Received gentle IV fluids. Continue strict intake and output charting, Daily weights.  Check BMP in AM.  Latest creatinine of 1.1.  Hypomagnesemia had episodes of paroxysmal SVT overnight.  Magnesium of 1.3.  Receiving 2 g of IV magnesium sulfate.  Will add oral magnesium oxide as well  Shortness of breath, chest discomfort.  Negative troponins.  EKG with atrial flutter.  Patient had a recent 2D echocardiogram on 05/13/2022 with LV ejection fraction 45 to 50% with regional wall motion abnormality on the left ventricle.  History of pericardiocentesis, mitral valve repair patient.  Patient is supposed to follow-up with cardiology as outpatient.  Patient continues to have chest discomfort at this time and has mild tachycardia.  On Cardizem.  We will consult cardiology due to her significant cardiac history  Muscular strain with low-grade tear seen on noncontrast MRI pelvis Patient complains of difficulty ambulating and pain on the left leg.  MRI of the lower extremity showed extensive muscle strain with low-grade tearing involving the left obturator externus muscle without tendon disruption or intramuscular hematoma.  Muscle strain with low-grade tear involving the inferior aspect of the distal left gluteus minimus muscle.  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  Degenerative facet arthropathy of L4-5 and L5-S1 with associated reactive subchondral bone marrow edema.  Continue conservative treatment including analgesia.  PT OT has recommended home health PT on discharge continue muscle relaxant, oxycodone and as needed Dilaudid.  CK level of 153.   Mild left knee effusion, POA Continue supportive care.  Ice packs.  Is taking colchicine as outpatient we will continue for now.  Essential hypertension Continue metoprolol, Cardizem from home,  blood pressure seems elevated at this time.   Hyperlipidemia Continue statins   Chronic anxiety/depression Continue Prozac, gabapentin and Atarax, less tearful at this time.   Status post recent mitral valve and tricuspid valve repair 04/19/2022 Paroxysmal A-fib on Coumadin  INR of 2.2 today.  Pharmacy dosing Coumadin.   History of large pericardial effusion status post pericardiocentesis Follows with cardiology outpatient we will consult cardiology.  On colchicine as outpatient.  Diabetes mellitus type 2..  Latest hemoglobin A1c of 6.1. on diabetic diet.  Will consider sliding scale insulin as needed.   Chronic systolic CHF 22-29% Holding Lasix at this time.  Continue intake and output charting  Falls, debility weakness.  PT evaluation recommend home health PT on discharge      DVT prophylaxis:   Coumadin   Code Status:     Code Status: Full Code  Disposition:  Home with home health  Status is: observation  The patient would benefit from inpatient status because, ambulatory dysfunction, ongoing chest pain, multiple comorbidities,   Family Communication:  Communicated with the patient at bedside.  Consultants:  None  Procedures:  None  Antimicrobials:  None  Anti-infectives (From admission, onward)    None       Subjective: Today, patient was seen and examined at bedside.  Still complains of chest discomfort at this time.  Less tearful and anxious today.  Mildly tachycardic.  Complains of difficulty moving the left leg and left knee pain.    Objective: Vitals:   05/23/22 1956 05/24/22 0331 05/24/22 0555 05/24/22 0752  BP: (!) 140/102 (!) 152/94  (!) 146/109  Pulse: (!) 116 92  93  Resp: '20 19  18  '$ Temp: 99.1 F (37.3 C) 98.1 F (36.7 C)  98.1 F (36.7 C)  TempSrc: Oral Oral  Oral  SpO2: 95% 95%  94%  Weight:   94.8 kg   Height:        Intake/Output Summary (Last 24 hours) at 05/24/2022 0945 Last data filed at 05/24/2022 0651 Gross per 24 hour   Intake 1342.57 ml  Output 1605 ml  Net -262.43 ml   Filed Weights   05/22/22 1200 05/24/22 0555  Weight: 96.2 kg 94.8 kg    Physical Examination: Body mass index is 29.15 kg/m.   General:  Average built, not in obvious distress, mildly anxious HENT:   No scleral pallor or icterus noted. Oral mucosa is moist.  Chest:  Clear breath sounds.  Diminished breath sounds bilaterally. No crackles or wheezes.  CVS: S1 &S2 heard.   Mildly tachycardic, midline sternal scar noted. Abdomen: Soft, nontender, nondistended.  Bowel sounds are heard.   Extremities: No cyanosis, clubbing or edema.  Left lower extremity weakness.  Able to wiggle toes, capillary refill present Psych: Alert, awake and oriented, anxious and tearful. CNS:  No cranial nerve deficits.  Lower extremity weakness noted with left-sided knee tenderness on palpation.  Capillary refill present Skin: Warm and dry.  No rashes noted.  Data Reviewed:   CBC: Recent Labs  Lab 05/22/22 1531 05/23/22 1025 05/24/22 0327  WBC 6.3 7.2 7.2  NEUTROABS 4.3 5.2 4.5  HGB 10.1* 10.4* 9.6*  HCT 32.5* 32.4* 29.5*  MCV 90.3 87.6 86.5  PLT 202 215 798    Basic Metabolic Panel: Recent Labs  Lab 05/22/22 1531 05/24/22 0327  NA 138 138  K 4.1 4.1  CL 107 110  CO2 22 20*  GLUCOSE 127* 77  BUN  19 12  CREATININE 2.07* 1.14*  CALCIUM 7.5* 7.8*  MG  --  1.3*    Liver Function Tests: Recent Labs  Lab 05/22/22 1531  AST 19  ALT 11  ALKPHOS 119  BILITOT 0.5  PROT 6.3*  ALBUMIN 3.5     Radiology Studies: DG Chest 1 View  Result Date: 05/22/2022 CLINICAL DATA:  Evaluate for pulmonary edema. EXAM: CHEST  1 VIEW COMPARISON:  Chest radiograph dated 05/20/2022. FINDINGS: There is cardiomegaly with vascular congestion and mild edema, progressed since the prior radiograph. Superimposed pneumonia is not excluded. Clinical correlation is recommended. No focal consolidation, pleural effusion or pneumothorax. Mitral bowel repair. Median  sternotomy wires. No acute osseous pathology. IMPRESSION: Cardiomegaly with vascular congestion and mild edema, progressed since the prior radiograph. Electronically Signed   By: Anner Crete M.D.   On: 05/22/2022 21:34   DG Knee 1-2 Views Left  Result Date: 05/22/2022 CLINICAL DATA:  Fall and trauma to the left knee. EXAM: LEFT KNEE - 1-2 VIEW COMPARISON:  Left lower extremity radiograph dated 05/22/2022. FINDINGS: There is no acute fracture or dislocation. The bones are well mineralized. Mild arthritic changes of the left knee. No joint effusion. The soft tissues are unremarkable. IMPRESSION: No acute fracture or dislocation. Electronically Signed   By: Anner Crete M.D.   On: 05/22/2022 20:11   CT Head Wo Contrast  Result Date: 05/22/2022 CLINICAL DATA:  Polytrauma, blunt fall on coumadin; Polytrauma, blunt EXAM: CT HEAD WITHOUT CONTRAST CT CERVICAL SPINE WITHOUT CONTRAST TECHNIQUE: Multidetector CT imaging of the head and cervical spine was performed following the standard protocol without intravenous contrast. Multiplanar CT image reconstructions of the cervical spine were also generated. RADIATION DOSE REDUCTION: This exam was performed according to the departmental dose-optimization program which includes automated exposure control, adjustment of the mA and/or kV according to patient size and/or use of iterative reconstruction technique. COMPARISON:  None Available. FINDINGS: CT HEAD FINDINGS Brain: No evidence of large-territorial acute infarction. No parenchymal hemorrhage. No mass lesion. No extra-axial collection. No mass effect or midline shift. No hydrocephalus. Basilar cisterns are patent. Vascular: No hyperdense vessel. Skull: No acute fracture or focal lesion. Sinuses/Orbits: Paranasal sinuses and mastoid air cells are clear. The orbits are unremarkable. Other: None. CT CERVICAL SPINE FINDINGS Alignment: Straightening of normal cervical lordosis likely due to positioning and  degenerative changes. Skull base and vertebrae: Multilevel degenerative changes of the spine. No associated severe osseous neural foraminal or central canal stenosis. No acute fracture. No aggressive appearing focal osseous lesion or focal pathologic process. Soft tissues and spinal canal: No prevertebral fluid or swelling. No visible canal hematoma. Upper chest: Partially visualized bilateral pleural effusions. Interlobular septal wall thickening. Other: None. IMPRESSION: 1. No acute intracranial abnormality. 2. No acute displaced fracture or traumatic listhesis of the cervical spine. 3. Partially visualized bilateral pleural effusions. Recommend PA and lateral view of the chest for further evaluation. 4. Likely pulmonary edema. Electronically Signed   By: Iven Finn M.D.   On: 05/22/2022 19:51   CT Cervical Spine Wo Contrast  Result Date: 05/22/2022 CLINICAL DATA:  Polytrauma, blunt fall on coumadin; Polytrauma, blunt EXAM: CT HEAD WITHOUT CONTRAST CT CERVICAL SPINE WITHOUT CONTRAST TECHNIQUE: Multidetector CT imaging of the head and cervical spine was performed following the standard protocol without intravenous contrast. Multiplanar CT image reconstructions of the cervical spine were also generated. RADIATION DOSE REDUCTION: This exam was performed according to the departmental dose-optimization program which includes automated exposure control, adjustment of the mA  and/or kV according to patient size and/or use of iterative reconstruction technique. COMPARISON:  None Available. FINDINGS: CT HEAD FINDINGS Brain: No evidence of large-territorial acute infarction. No parenchymal hemorrhage. No mass lesion. No extra-axial collection. No mass effect or midline shift. No hydrocephalus. Basilar cisterns are patent. Vascular: No hyperdense vessel. Skull: No acute fracture or focal lesion. Sinuses/Orbits: Paranasal sinuses and mastoid air cells are clear. The orbits are unremarkable. Other: None. CT CERVICAL  SPINE FINDINGS Alignment: Straightening of normal cervical lordosis likely due to positioning and degenerative changes. Skull base and vertebrae: Multilevel degenerative changes of the spine. No associated severe osseous neural foraminal or central canal stenosis. No acute fracture. No aggressive appearing focal osseous lesion or focal pathologic process. Soft tissues and spinal canal: No prevertebral fluid or swelling. No visible canal hematoma. Upper chest: Partially visualized bilateral pleural effusions. Interlobular septal wall thickening. Other: None. IMPRESSION: 1. No acute intracranial abnormality. 2. No acute displaced fracture or traumatic listhesis of the cervical spine. 3. Partially visualized bilateral pleural effusions. Recommend PA and lateral view of the chest for further evaluation. 4. Likely pulmonary edema. Electronically Signed   By: Iven Finn M.D.   On: 05/22/2022 19:51   MR PELVIS WO CONTRAST  Result Date: 05/22/2022 CLINICAL DATA:  Hip trauma, fracture suspected, xray done EXAM: MRI PELVIS WITHOUT CONTRAST TECHNIQUE: Multiplanar multisequence MR imaging of the pelvis was performed. No intravenous contrast was administered. COMPARISON:  Same-day x-ray and CT FINDINGS: Bones/Joint/Cartilage No acute fracture. No dislocation. No femoral head avascular necrosis. 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 No hip joint effusion. Bony pelvis intact without diastasis. SI joints and pubic symphysis within normal limits. Degenerative facet arthropathy of L4-5 and L5-S1 with associated reactive subchondral bone marrow edema. Elsewhere, no bone marrow edema. No marrow replacing bone lesion. Ligaments Intact. Muscles and Tendons Strain with low-grade muscle tear involving the inferior aspect of the distal left gluteus minimus muscle (series 9, image 29). Distal tendon intact. Extensive muscle strain with low-grade tearing involving the left obturator externus muscle (series 9, image  38) without tendon disruption or intramuscular hematoma. No additional acute musculotendinous findings. Soft tissues Mild nonspecific subcutaneous edema. No organized fluid collection or hematoma. No inguinal lymphadenopathy. Small volume free fluid within the pelvis. IMPRESSION: 1. No acute osseous abnormality of the pelvis. Specifically, no fracture of the left hip. 2. Extensive muscle strain with low-grade tearing involving the left obturator externus muscle without tendon disruption or intramuscular hematoma. 3. Muscle strain with low-grade tear involving the inferior aspect of the distal left gluteus minimus muscle. 4. 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 5. Degenerative facet arthropathy of L4-5 and L5-S1 with associated reactive subchondral bone marrow edema. Electronically Signed   By: Davina Poke D.O.   On: 05/22/2022 19:39   DG Knee 2 Views Left  Result Date: 05/22/2022 CLINICAL DATA:  Fall.  Knee pain and swelling. EXAM: LEFT KNEE - 2 VIEW COMPARISON:  Left knee radiographs 08/24/2011 FINDINGS: Progressive degenerative changes are present within the medial compartment left knee. No acute fracture dislocation is present. No significant effusion is present. Soft tissues are unremarkable. IMPRESSION: 1. 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 2. No acute abnormality. Electronically Signed   By: San Morelle M.D.   On: 05/22/2022 16:22   DG Femur 1 View Left  Result Date: 05/22/2022 CLINICAL DATA:  Fall.  Knee pain and swelling. EXAM: LEFT FEMUR 1 VIEW COMPARISON:  None Available. FINDINGS: AP images demonstrate no acute  fracture. Degenerative changes are present in the knee. IMPRESSION: 1. No acute abnormality. 2. Degenerative changes of the knee. Electronically Signed   By: San Morelle M.D.   On: 05/22/2022 16:20   CT Hip Left Wo Contrast  Result Date: 05/22/2022 CLINICAL DATA:  Hip trauma, fracture suspected, xray done  EXAM: CT OF THE LEFT HIP WITHOUT CONTRAST TECHNIQUE: Multidetector CT imaging of the left hip was performed according to the standard protocol. Multiplanar CT image reconstructions were also generated. RADIATION DOSE REDUCTION: This exam was performed according to the departmental dose-optimization program which includes automated exposure control, adjustment of the mA and/or kV according to patient size and/or use of iterative reconstruction technique. COMPARISON:  X-ray 05/22/2022 FINDINGS: Bones/Joint/Cartilage No acute fracture. No dislocation. No evidence of femoral head avascular necrosis. Mild left hip osteoarthritis with joint space narrowing and small marginal osteophyte formation. No appreciable hip joint effusion. Included portion of the left hemipelvis is intact without evidence of fracture or diastasis. No suspicious lytic or sclerotic bone lesion. Ligaments Suboptimally assessed by CT. Muscles and Tendons No acute musculotendinous abnormality by CT. Soft tissues No fluid collection or hematoma.  No left inguinal lymphadenopathy. IMPRESSION: No acute fracture or dislocation of the left hip. Electronically Signed   By: Davina Poke D.O.   On: 05/22/2022 15:22   DG Hip Unilat W or Wo Pelvis 2-3 Views Left  Result Date: 05/22/2022 CLINICAL DATA:  Acute LEFT hip pain following fall. Initial encounter. EXAM: DG HIP (WITH OR WITHOUT PELVIS) 2-3V LEFT COMPARISON:  12/18/2020 CT FINDINGS: There is no evidence of hip fracture or dislocation. There is no evidence of arthropathy or other focal bone abnormality. IMPRESSION: Negative. Electronically Signed   By: Margarette Canada M.D.   On: 05/22/2022 13:17      LOS: 0 days    Flora Lipps, MD [facility] Available via Epic secure chat 7am-7pm After these hours, please refer to coverage provider listed on [email or web address] 05/24/2022, 9:45 AM

## 2022-05-24 NOTE — Consult Note (Signed)
Cardiology Consultation:   Patient ID: [name] MRN: [id number]; DOB: 10/12/1961  Admit date: 05/22/2022 Date of Consult: 05/24/2022  PCP:  [facility] Cardiologist:  Werner Lean, MD        Patient Profile:   [name] is a 61 y.o. female with a hx of  severe mitral insufficiency and tricuspid insufficiency status post mitral valve and tricuspid valve repairs on 04/19/2022 by Dr. Roxan Hockey.  Subsequent large pericardial effusion with tamponade physiology status post pericardiocentesis on 05/10/2022.  Also, paroxysmal A-fib on Coumadin, essential hypertension, hyperlipidemia, prediabetes, GERD, chronic anxiety/depression, chronic normocytic anemia, hepatitis C, tobacco use, and chronic pain syndrome.  She was admitted 06/17 after a fall w/ soft tissue injuries, Cr 2.07, Hgb 10.1, INR 1.7.   Cards asked to see 05/24/2022 for the evaluation of Afib and chest pain, at the request of Dr Louanne Belton.  History of Present Illness:   [name] was hospitalized 05/15-05/24 for the surgery. She required 1 U RBCs and 1 platelet pheresis for Hct 20 and plt 78,000. She developed atrial flutter POD 8, Cardizem CD 120 mg qd added.   ER visit 05/28 for chest pain, continuous since surgery but a little worse that day. Worse w/ cough, certain movements. Trop elevated at 64, but flat, INR 1.8 so PE not felt likely >> d/c to rehab. Still in flutter.  Admitted 06/05-06/12 with pericardial tamponade, 450 cc removed by pericardiocentesis. This was felt 2nd post-op pericarditis. She was started on colchicine. Still in flutter, INR 2.1 on admit, coumadin held. Cr 2.10 >> 1.02 w/ IVF, Cozaar d/c'd. INR 1.1 at d/c, coumadin had been resumed. H&H 8.9/29.4 at d/c.   Office visit [facility] 06/15, BP 85/64, P-63, Losartan d/c'd although it had been stopped during the previous admit and was not on her med list.   Admitted 06/17 after slipping out of bed  and on to the floor. She had been on the floor for over an hour. L knee swollen. INR 1.7. X rays ok. AKI w/ Cr 2.07, Calcium 7.5, Hgb 10.1.  06/18, noted to have 25 sec run NSVT, still in flutter. K+ 4.1, Mg 1.3 >> supplemented IV >> oral. Pt having some SOB and chest discomfort. EF on 06/08 with ER 45-50%, +RWMA, which are new from previous echoes.  [name] has been having chest pain ever since the surgery.  It has been constant, between a 5/10 and 7/10.  Pain medications help to decrease it.  When the pain is at its worst, it is associated with with shortness of breath.  She describes it as a heaviness.  She has also had chest wall soreness from the surgery.  The pain is not made worse by lying back and not made better by sitting up or leaning forward.  There may be an exertional component, but this is not clear because she is weak and gets short of breath pretty easily.  She is doing pretty well, considering.  She ambulated with physical therapy and did not notice an increase in her chest pain with this.  She feels the palpitations when her heart is going fast, but has not had episodes where they make her lightheaded or dizzy.  However, when her heart is going fast, it makes her uncomfortable.    Past Medical History:  Diagnosis Date   Acute pericardial effusion 05/17/2022   AKI (acute kidney injury) ([place]) 05/17/2022   Anxiety    Arthritis    Cervicalgia  Chondromalacia of patella    Chronic neck pain    Chronic pain syndrome    Depression    secondary to loss of her son at age 40   Diabetes mellitus    Diabetic neuropathy ([place])    DMII (diabetes mellitus, type 2) ([place])    Dysrhythmia    Enthesopathy of hip region    Fibromyalgia    GERD (gastroesophageal reflux disease)    Hep C w/o coma, chronic ([place])    Hepatitis C    diagnosed 2005   Hypertension    Insomnia    Insomnia    Low back pain    Lumbago    Mitral regurgitation    Obesity    Pain in joint, upper arm     Primary localized osteoarthrosis, lower leg    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   Substance abuse ([place])    sober since 2001   Tobacco abuse    Tricuspid regurgitation    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    Past Surgical History:  Procedure Laterality Date   ABDOMINAL HYSTERECTOMY     at age 16, unknown reasons   CARPAL TUNNEL RELEASE  12/07/2011   left side   EYE SURGERY     FOOT SURGERY Bilateral    MITRAL VALVE REPAIR N/A 04/19/2022   Procedure: MITRAL VALVE REPAIR WITH 30MM PHYSIO II ANNULOPLASTY RING;  Surgeon: Melrose Nakayama, MD;  Location: [place];  Service: Open Heart Surgery;  Laterality: N/A;   PERICARDIOCENTESIS N/A 05/10/2022   Procedure: PERICARDIOCENTESIS;  Surgeon: Jettie Booze, MD;  Location: [facility];  Service: Cardiovascular;  Laterality: N/A;   RIGHT/LEFT HEART CATH AND CORONARY ANGIOGRAPHY N/A 02/12/2022   Procedure: RIGHT/LEFT HEART CATH AND CORONARY ANGIOGRAPHY;  Surgeon: Belva Crome, MD;  Location: [place] [facility];  Service: Cardiovascular;  Laterality: N/A;   TEE WITHOUT CARDIOVERSION N/A 02/03/2022   Procedure: TRANSESOPHAGEAL ECHOCARDIOGRAM (TEE);  Surgeon: Josue Hector, MD;  Location: [facility];  Service: Cardiovascular;  Laterality: N/A;   TEE WITHOUT CARDIOVERSION N/A 04/19/2022   Procedure: TRANSESOPHAGEAL ECHOCARDIOGRAM (TEE);  Surgeon: Melrose Nakayama, MD;  Location: [place];  Service: Open Heart Surgery;  Laterality: N/A;   TRICUSPID VALVE REPLACEMENT N/A 04/19/2022   Procedure: TRICUSPID VALVE REPAIR WITH 32MM MC3 ANNULOPLASTY RING;  Surgeon: Melrose Nakayama, MD;  Location: [facility];  Service: Open Heart Surgery;  Laterality: N/A;     Home Medications:  Prior to Admission medications   Medication Sig Start Date End Date Taking? Authorizing Provider  acetaminophen (TYLENOL) 500 MG tablet Take 1,000 mg by mouth every 6 (six) hours as needed for mild pain.   Yes [provider]  albuterol (PROVENTIL HFA;VENTOLIN HFA) 108 (90 Base) MCG/ACT inhaler Inhale 2 puffs into the lungs every 6 (six) hours as needed for wheezing or shortness of breath.    Yes [provider]  aspirin 81 MG EC tablet Take 81 mg by mouth daily.   Yes [provider]  atorvastatin (LIPITOR) 20 MG tablet Take 20 mg by mouth daily.   Yes [provider]  buPROPion (WELLBUTRIN XL) 300 MG 24 hr tablet Take 300 mg by mouth in the morning. 03/24/22  Yes [provider]  colchicine 0.6 MG tablet Take 1 tablet (0.6 mg total) by mouth daily. 05/17/22  Yes [name], NP  Cyanocobalamin (VITAMIN B 12 PO) Take 500 mcg by mouth daily.   Yes [provider]  diltiazem (CARDIZEM CD) 300 MG 24 hr capsule Take 1 capsule (300 mg total) by mouth daily. 05/18/22  Yes Josue Hector, MD  ferrous [id number] C-folic acid (TRINSICON / FOLTRIN) capsule Take 1 capsule by mouth 2 (two) times daily after a meal. 04/28/22  Yes Roddenberry, Myron G, PA-C  FLUoxetine (PROZAC) 20 MG capsule Take 20 mg by mouth every morning. 04/14/22  Yes [provider]  furosemide (LASIX) 40 MG tablet Take 40 mg by mouth 2 (two) times daily.   Yes [provider]  gabapentin (NEURONTIN) 600 MG tablet Take 600 mg by mouth 3 (three) times daily. 10/12/21  Yes [provider]  hydrOXYzine (ATARAX) 25 MG tablet Take 25 mg by mouth every 4 (four) hours as needed for anxiety.   Yes [provider]  linagliptin (TRADJENTA) 5 MG TABS tablet Take 5 mg by mouth in the morning.   Yes [provider]  methocarbamol (ROBAXIN) 500 MG tablet Take 500 mg by mouth 4 (four) times daily.   Yes [provider]  metoprolol tartrate (LOPRESSOR) 50 MG tablet Take 1 tablet (50 mg total) by mouth 2 (two) times daily. 05/17/22  Yes [name], NP  nystatin ointment (MYCOSTATIN) Apply topically 2 (two) times daily. 04/05/22  Yes [provider]   omeprazole (PRILOSEC) 20 MG capsule Take 20 mg by mouth daily.   Yes [provider]  oxyCODONE (ROXICODONE) 15 MG immediate release tablet Take 15 mg by mouth every 4 (four) hours as needed for pain. 05/06/22  Yes [provider]  PREMARIN 0.45 MG tablet Take 0.45 mg by mouth daily. 01/11/22  Yes [provider]  traZODone (DESYREL) 100 MG tablet Take 2.5 tablets (250 mg total) by mouth at bedtime as needed for sleep. 11/23/18  Yes [name], MD  warfarin (COUMADIN) 5 MG tablet Take 1 tablet (5 mg total) by mouth daily. 05/17/22  Yes [name], NP  ACCU-CHEK FASTCLIX LANCETS MISC 1 each by Does not apply route daily. Check blood sugar once daily. 250.00 10/26/11   Lane Hacker L, DO  glucose blood (ACCU-CHEK SMARTVIEW) test strip Check blood sugar once daily. 250.00 10/26/11 12/30/21  [name], DO  naloxone [facility]) nasal spray 4 mg/0.1 mL Place 1 spray into the nose once.    [provider]    Inpatient Medications: Scheduled Meds:  aspirin EC  81 mg Oral Daily   atorvastatin  20 mg Oral Daily   buPROPion  300 mg Oral q AM   colchicine  0.6 mg Oral Daily   diltiazem  300 mg Oral Daily   estrogens (conjugated)  0.45 mg Oral Daily   ferrous [id number] C-folic acid  1 capsule Oral BID PC   FLUoxetine  20 mg Oral q morning   gabapentin  300 mg Oral TID   magnesium oxide  400 mg Oral BID   methocarbamol  500 mg Oral QID   metoprolol tartrate  50 mg Oral BID   pantoprazole  40 mg Oral Daily   warfarin  5 mg Oral ONCE-1600   Warfarin - Pharmacist Dosing Inpatient   Does not apply q1600   Continuous Infusions:  PRN Meds: acetaminophen, albuterol, HYDROmorphone (DILAUDID) injection, hydrOXYzine, melatonin, nitroGLYCERIN, oxyCODONE, polyethylene glycol, traZODone  Allergies:    Allergies  Allergen Reactions   Hydrocodone-Acetaminophen Itching   Hydrocodone Itching    Tolerates oxycodone     Social History:    Social History   Socioeconomic History  Marital status: Single    Spouse name: Not on file   Number of children: 2   Years of education: Not on file   Highest education level: Not on file  Occupational History   Occupation: CNA    Comment: worked for 15 years   Occupation: disability  Tobacco Use   Smoking status: Every Day    Packs/day: 0.25    Years: 35.00    Total pack years: 8.75    Types: Cigarettes   Smokeless tobacco: Never   Tobacco comments:    4 cigarettes a day  Vaping Use   Vaping Use: Some days  Substance and Sexual Activity   Alcohol use: No   Drug use: No   Sexual activity: Not Currently    Comment: Celebate since 2000  Other Topics Concern   Not on file  Social History Narrative   Born and raised by mom until mom died when she was 14yo. After that she stayed with her aunt. Dad was married to someone else and was never around.  Pt has 1 brother and 1 sister and pt is the middle child. Pt had a very rough childhood. Never married. 2 kids one son died several  yrs ago and other son has Schizophrenia. Pt has graduated HS and has some college. Pt is on disability and is unemployed. 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       Financial assistance approved for 100% discount at [facility] and has [facility] card   [employer]  September 29, 2010 2:27 PM   Social Determinants of Health   Financial Resource Strain: Not on file  Food Insecurity: Not on file  Transportation Needs: Not on file  Physical Activity: Not on file  Stress: Not on file  Social Connections: Not on file  Intimate Partner Violence: Not on file    Family History:   Family History  Problem Relation Age of Onset   Uterine cancer Mother    Alcohol abuse Father    Alcohol abuse Sister    Drug abuse Sister    Drug abuse Brother    Alcohol abuse Brother    Schizophrenia Son    Diabetes Other    Arthritis Other    Hypertension Other    Heart attack Son 48       Died suddenly     ROS:  Please  see the history of present illness.  All other ROS reviewed and negative.     Physical Exam/Data:   Vitals:   05/24/22 0331 05/24/22 0555 05/24/22 0752 05/24/22 1151  BP: (!) 152/94  (!) 146/109 (!) 149/120  Pulse: 92  93 82  Resp: '19  18 17  '$ Temp: 98.1 F (36.7 C)  98.1 F (36.7 C) 97.7 F (36.5 C)  TempSrc: Oral  Oral Oral  SpO2: 95%  94% 100%  Weight:  94.8 kg    Height:        Intake/Output Summary (Last 24 hours) at 05/24/2022 1326 Last data filed at 05/24/2022 1220 Gross per 24 hour  Intake 918.52 ml  Output 1605 ml  Net -686.48 ml      05/24/2022    5:55 AM 05/22/2022   12:00 PM 05/20/2022    2:14 PM  Last 3 Weights  Weight (lbs) 209 lb 212 lb 213 lb  Weight (kg) 94.802 kg 96.163 kg 96.616 kg     Body mass index is 29.15 kg/m.  General:  Well nourished, well developed, in no  acute distress at rest HEENT: normal Neck: JVD 10-11 cm Vascular: No carotid bruits; Distal pulses 2+ bilaterally Cardiac:  normal S1, S2; irregular rate and rhythm, 2/6 murmur  Lungs:  rales bases bilaterally, no wheezing, rhonchi  Abd: soft, nontender, no hepatomegaly  Ext: no edema Musculoskeletal:  No deformities, BUE and BLE strength weak but equal Skin: warm and dry  Neuro:  CNs 2-12 intact, no focal abnormalities noted Psych:  Normal affect   EKG:  The EKG was personally reviewed and demonstrates:  Atrial flutter, HR 103 Telemetry:  Telemetry was personally reviewed and demonstrates:  atrial flutter, rate elevated at times, but more controlled today  Relevant CV Studies:  2D echo 05/13/2022 IMPRESSIONS   1. Moderate septal hypertrophy with otherwise mild LVH. Basal to mid  inferior and inferoseptal hypokinesis. Left ventricular ejection fraction,  by estimation, is 45 to 50%. The left ventricle has mildly decreased  function. The left ventricle demonstrates regional wall motion abnormalities (see scoring diagram/findings for description). There is moderate left ventricular  hypertrophy.   2. Right ventricular systolic function is normal. The right ventricular  size is normal. There is mildly elevated pulmonary artery systolic  pressure.   3. Left atrial size was moderately dilated.   4. The mitral valve has been repaired/replaced. Trivial mitral valve  regurgitation. No evidence of mitral stenosis. There is a 30 mm prosthetic  annuloplasty ring present in the mitral position. Procedure Date: 04/27/22.   5. Pericardial effusion has resolved.   6. The tricuspid valve is has been repaired/replaced. The tricuspid valve  is status post repair with an annuloplasty ring. Tricuspid valve  regurgitation is mild.   7. The aortic valve is normal in structure. Aortic valve regurgitation is  not visualized. No aortic stenosis is present.   8. The inferior vena cava is normal in size with greater than 50%  respiratory variability, suggesting right atrial pressure of 3 mmHg.    2D echo 05/10/22 IMPRESSIONS   1. Right ventricle does not relax completely suggesting increase in  intrapericardial pressure.Subtle appreciation for ventricular  interdependence. Full study for tamponade physiology not performed: Tissue  doppler, MV/TV respiratory variation and IVC.  Moderate pericardial effusion. The pericardial effusion is  circumferential.   2. Left ventricular ejection fraction, by estimation, is 55 to 60%. The  left ventricle has normal function. Left ventricular diastolic function  could not be evaluated.   3. Right ventricular systolic function is normal.   4. The mitral valve is grossly normal.   5. The aortic valve was not well visualized.   CARDIAC CATH: 02/12/2022 CONCLUSIONS: Mild pulmonary hypertension with mean PA pressure 31 mmHg.  Mean pulmonary wedge pressure 23 mmHg with a V wave 41 mmHg.  WHO group 2 pulmonary hypertension. Pulmonary vascular resistance 0.99 Woods units Left ventricular end diastolic pressure 35 mmHg, consistent with diastolic heart  failure. Right dominant coronary anatomy.  Widely patent coronaries without obstructive disease. Previously documented severe mitral regurgitation.   RECOMMENDATIONS:   Per Dr. Glenford Bayley who is making plans for referral to CV surgeon. Home later today if no complications. Resume all home medications including p.m. doses of antihypertensive agents.  She did not take any of her medicine this morning and had elevated blood pressure requiring IV hydralazine and labetalol.     Laboratory Data:  High Sensitivity Troponin:   Recent Labs  Lab 05/10/22 0603 05/22/22 1727 05/22/22 2200 05/23/22 0917 05/23/22 1123  TROPONINIHS 33* '11 12 13 14     '$ Chemistry  Recent Labs  Lab 05/22/22 1531 05/24/22 0327  NA 138 138  K 4.1 4.1  CL 107 110  CO2 22 20*  GLUCOSE 127* 77  BUN 19 12  CREATININE 2.07* 1.14*  CALCIUM 7.5* 7.8*  MG  --  1.3*  GFRNONAA 27* 54*  ANIONGAP 9 8    Recent Labs  Lab 05/22/22 1531  PROT 6.3*  ALBUMIN 3.5  AST 19  ALT 11  ALKPHOS 119  BILITOT 0.5   Lipids No results for input(s): "CHOL", "TRIG", "HDL", "LABVLDL", "LDLCALC", "CHOLHDL" in the last 168 hours.  Hematology Recent Labs  Lab 05/22/22 1531 05/23/22 1025 05/24/22 0327  WBC 6.3 7.2 7.2  RBC 3.60* 3.70* 3.41*  HGB 10.1* 10.4* 9.6*  HCT 32.5* 32.4* 29.5*  MCV 90.3 87.6 86.5  MCH 28.1 28.1 28.2  MCHC 31.1 32.1 32.5  RDW 15.1 15.3 15.1  PLT 202 215 230   Thyroid  Lab Results  Component Value Date   TSH 1.75 03/25/2016    DDimer No results for input(s): "DDIMER" in the last 168 hours. Lab Results  Component Value Date   INR 2.2 (H) 05/24/2022   INR 1.7 (H) 05/23/2022   INR 1.7 (H) 05/22/2022     Radiology/Studies:  DG Chest 1 View  Result Date: 05/22/2022 CLINICAL DATA:  Evaluate for pulmonary edema. EXAM: CHEST  1 VIEW COMPARISON:  Chest radiograph dated 05/20/2022. FINDINGS: There is cardiomegaly with vascular congestion and mild edema, progressed since the prior  radiograph. Superimposed pneumonia is not excluded. Clinical correlation is recommended. No focal consolidation, pleural effusion or pneumothorax. Mitral bowel repair. Median sternotomy wires. No acute osseous pathology. IMPRESSION: Cardiomegaly with vascular congestion and mild edema, progressed since the prior radiograph. Electronically Signed   By: Anner Crete M.D.   On: 05/22/2022 21:34   DG Knee 1-2 Views Left  Result Date: 05/22/2022 CLINICAL DATA:  Fall and trauma to the left knee. EXAM: LEFT KNEE - 1-2 VIEW COMPARISON:  Left lower extremity radiograph dated 05/22/2022. FINDINGS: There is no acute fracture or dislocation. The bones are well mineralized. Mild arthritic changes of the left knee. No joint effusion. The soft tissues are unremarkable. IMPRESSION: No acute fracture or dislocation. Electronically Signed   By: Anner Crete M.D.   On: 05/22/2022 20:11   CT Head Wo Contrast  Result Date: 05/22/2022 CLINICAL DATA:  Polytrauma, blunt fall on coumadin; Polytrauma, blunt EXAM: CT HEAD WITHOUT CONTRAST CT CERVICAL SPINE WITHOUT CONTRAST TECHNIQUE: Multidetector CT imaging of the head and cervical spine was performed following the standard protocol without intravenous contrast. Multiplanar CT image reconstructions of the cervical spine were also generated. RADIATION DOSE REDUCTION: This exam was performed according to the departmental dose-optimization program which includes automated exposure control, adjustment of the mA and/or kV according to patient size and/or use of iterative reconstruction technique. COMPARISON:  None Available. FINDINGS: CT HEAD FINDINGS Brain: No evidence of large-territorial acute infarction. No parenchymal hemorrhage. No mass lesion. No extra-axial collection. No mass effect or midline shift. No hydrocephalus. Basilar cisterns are patent. Vascular: No hyperdense vessel. Skull: No acute fracture or focal lesion. Sinuses/Orbits: Paranasal sinuses and mastoid air  cells are clear. The orbits are unremarkable. Other: None. CT CERVICAL SPINE FINDINGS Alignment: Straightening of normal cervical lordosis likely due to positioning and degenerative changes. Skull base and vertebrae: Multilevel degenerative changes of the spine. No associated severe osseous neural foraminal or central canal stenosis. No acute fracture. No aggressive appearing focal osseous lesion or focal pathologic process.  Soft tissues and spinal canal: No prevertebral fluid or swelling. No visible canal hematoma. Upper chest: Partially visualized bilateral pleural effusions. Interlobular septal wall thickening. Other: None. IMPRESSION: 1. No acute intracranial abnormality. 2. No acute displaced fracture or traumatic listhesis of the cervical spine. 3. Partially visualized bilateral pleural effusions. Recommend PA and lateral view of the chest for further evaluation. 4. Likely pulmonary edema. Electronically Signed   By: Iven Finn M.D.   On: 05/22/2022 19:51   CT Cervical Spine Wo Contrast  Result Date: 05/22/2022 CLINICAL DATA:  Polytrauma, blunt fall on coumadin; Polytrauma, blunt EXAM: CT HEAD WITHOUT CONTRAST CT CERVICAL SPINE WITHOUT CONTRAST TECHNIQUE: Multidetector CT imaging of the head and cervical spine was performed following the standard protocol without intravenous contrast. Multiplanar CT image reconstructions of the cervical spine were also generated. RADIATION DOSE REDUCTION: This exam was performed according to the departmental dose-optimization program which includes automated exposure control, adjustment of the mA and/or kV according to patient size and/or use of iterative reconstruction technique. COMPARISON:  None Available. FINDINGS: CT HEAD FINDINGS Brain: No evidence of large-territorial acute infarction. No parenchymal hemorrhage. No mass lesion. No extra-axial collection. No mass effect or midline shift. No hydrocephalus. Basilar cisterns are patent. Vascular: No hyperdense  vessel. Skull: No acute fracture or focal lesion. Sinuses/Orbits: Paranasal sinuses and mastoid air cells are clear. The orbits are unremarkable. Other: None. CT CERVICAL SPINE FINDINGS Alignment: Straightening of normal cervical lordosis likely due to positioning and degenerative changes. Skull base and vertebrae: Multilevel degenerative changes of the spine. No associated severe osseous neural foraminal or central canal stenosis. No acute fracture. No aggressive appearing focal osseous lesion or focal pathologic process. Soft tissues and spinal canal: No prevertebral fluid or swelling. No visible canal hematoma. Upper chest: Partially visualized bilateral pleural effusions. Interlobular septal wall thickening. Other: None. IMPRESSION: 1. No acute intracranial abnormality. 2. No acute displaced fracture or traumatic listhesis of the cervical spine. 3. Partially visualized bilateral pleural effusions. Recommend PA and lateral view of the chest for further evaluation. 4. Likely pulmonary edema. Electronically Signed   By: Iven Finn M.D.   On: 05/22/2022 19:51   MR PELVIS WO CONTRAST  Result Date: 05/22/2022 CLINICAL DATA:  Hip trauma, fracture suspected, xray done EXAM: MRI PELVIS WITHOUT CONTRAST TECHNIQUE: Multiplanar multisequence MR imaging of the pelvis was performed. No intravenous contrast was administered. COMPARISON:  Same-day x-ray and CT FINDINGS: Bones/Joint/Cartilage No acute fracture. No dislocation. No femoral head avascular necrosis. 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 No hip joint effusion. Bony pelvis intact without diastasis. SI joints and pubic symphysis within normal limits. Degenerative facet arthropathy of L4-5 and L5-S1 with associated reactive subchondral bone marrow edema. Elsewhere, no bone marrow edema. No marrow replacing bone lesion. Ligaments Intact. Muscles and Tendons Strain with low-grade muscle tear involving the inferior aspect of the distal left  gluteus minimus muscle (series 9, image 29). Distal tendon intact. Extensive muscle strain with low-grade tearing involving the left obturator externus muscle (series 9, image 38) without tendon disruption or intramuscular hematoma. No additional acute musculotendinous findings. Soft tissues Mild nonspecific subcutaneous edema. No organized fluid collection or hematoma. No inguinal lymphadenopathy. Small volume free fluid within the pelvis. IMPRESSION: 1. No acute osseous abnormality of the pelvis. Specifically, no fracture of the left hip. 2. Extensive muscle strain with low-grade tearing involving the left obturator externus muscle without tendon disruption or intramuscular hematoma. 3. Muscle strain with low-grade tear involving the inferior aspect of the distal left gluteus  minimus muscle. 4. 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 5. Degenerative facet arthropathy of L4-5 and L5-S1 with associated reactive subchondral bone marrow edema. Electronically Signed   By: Davina Poke D.O.   On: 05/22/2022 19:39   DG Knee 2 Views Left  Result Date: 05/22/2022 CLINICAL DATA:  Fall.  Knee pain and swelling. EXAM: LEFT KNEE - 2 VIEW COMPARISON:  Left knee radiographs 08/24/2011 FINDINGS: Progressive degenerative changes are present within the medial compartment left knee. No acute fracture dislocation is present. No significant effusion is present. Soft tissues are unremarkable. IMPRESSION: 1. 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 2. No acute abnormality. Electronically Signed   By: San Morelle M.D.   On: 05/22/2022 16:22   DG Femur 1 View Left  Result Date: 05/22/2022 CLINICAL DATA:  Fall.  Knee pain and swelling. EXAM: LEFT FEMUR 1 VIEW COMPARISON:  None Available. FINDINGS: AP images demonstrate no acute fracture. Degenerative changes are present in the knee. IMPRESSION: 1. No acute abnormality. 2. Degenerative changes of the knee.  Electronically Signed   By: San Morelle M.D.   On: 05/22/2022 16:20   CT Hip Left Wo Contrast  Result Date: 05/22/2022 CLINICAL DATA:  Hip trauma, fracture suspected, xray done EXAM: CT OF THE LEFT HIP WITHOUT CONTRAST TECHNIQUE: Multidetector CT imaging of the left hip was performed according to the standard protocol. Multiplanar CT image reconstructions were also generated. RADIATION DOSE REDUCTION: This exam was performed according to the departmental dose-optimization program which includes automated exposure control, adjustment of the mA and/or kV according to patient size and/or use of iterative reconstruction technique. COMPARISON:  X-ray 05/22/2022 FINDINGS: Bones/Joint/Cartilage No acute fracture. No dislocation. No evidence of femoral head avascular necrosis. Mild left hip osteoarthritis with joint space narrowing and small marginal osteophyte formation. No appreciable hip joint effusion. Included portion of the left hemipelvis is intact without evidence of fracture or diastasis. No suspicious lytic or sclerotic bone lesion. Ligaments Suboptimally assessed by CT. Muscles and Tendons No acute musculotendinous abnormality by CT. Soft tissues No fluid collection or hematoma.  No left inguinal lymphadenopathy. IMPRESSION: No acute fracture or dislocation of the left hip. Electronically Signed   By: Davina Poke D.O.   On: 05/22/2022 15:22   DG Hip Unilat W or Wo Pelvis 2-3 Views Left  Result Date: 05/22/2022 CLINICAL DATA:  Acute LEFT hip pain following fall. Initial encounter. EXAM: DG HIP (WITH OR WITHOUT PELVIS) 2-3V LEFT COMPARISON:  12/18/2020 CT FINDINGS: There is no evidence of hip fracture or dislocation. There is no evidence of arthropathy or other focal bone abnormality. IMPRESSION: Negative. Electronically Signed   By: Margarette Canada M.D.   On: 05/22/2022 13:17   DG Chest 2 View  Result Date: 05/20/2022 CLINICAL DATA:  Status post mitral valve surgery EXAM: CHEST - 2 VIEW  COMPARISON:  Chest x-ray dated May 15, 2022 FINDINGS: Cardiac and mediastinal contours are within normal limits. Status post median sternotomy and valve replacement. Linear opacity of of the left upper lobe, likely due to atelectasis. Small right-greater-than-left pleural effusions. No focal consolidation. No evidence of pneumothorax. IMPRESSION: Small right-greater-than-left pleural effusions. Electronically Signed   By: Yetta Glassman M.D.   On: 05/20/2022 14:22     Assessment and Plan:   Atrial flutter: -Some of the staff notes say that she has been in sinus rhythm, but I believe that the atrial flutter has been continuous since she went into it on May 23, POD #8.  -  Her rate is elevated at times and it is controlled much of the time in the 70s, on diltiazem CD 300 mg daily and Lopressor 50 mg twice daily -She has been on Coumadin for anticoagulation, but has not had 3 weeks of being therapeutic. -In 2017, she was on Eliquis, this was stopped for unclear reasons. -Once her general medical condition is stabilized, and her INR can be consistently therapeutic, she can be cardioverted in 3 weeks -Left atrium is moderately dilated on echo, right atrium not dilated -Could possibly increase either the diltiazem or the metoprolol for more control of her tachycardia, but I worry that we will drop her resting heart rate too low.  2.  Chest pain - This has been continuous since the surgery, but she says it is different at times with a 7/10 chest pressure that is associated with shortness of breath - Echo 06/05, after her pericardiocentesis, showed a slightly decreased EF with new wall motion abnormalities - Her troponin was mildly elevated during her previous admission, at the time of her tamponade, but is normal now -Prior to her surgery, in March 2023, she had a cath that showed no significant coronary artery disease -Repeat echo and follow-up on results  3.  History of pericardial effusion and  abnormal chest x-ray this admission - Chest x-ray on 6/17 showed mild edema, progressed since previous x-ray - Upon review of the images, there is definitely more fluid on the most recent chest x-ray, and the cardiac silhouette is smaller than it was prior to her pericardiocentesis - Repeat limited echo as a stat to make sure she is not reaccumulating her pericardial effusion    Risk Assessment/Risk Scores:       New York Heart Association (NYHA) Functional Class NYHA Class II  CHA2DS2-VASc Score = 3   This indicates a 3.2% annual risk of stroke. The patient's score is based upon: CHF History: 0 HTN History: 1 Diabetes History: 1 Stroke History: 0 Vascular Disease History: 0 Age Score: 0 Gender Score: 1   For questions or updates, please contact [place] Please consult [email or web address] for contact info under    Signed, Rosaria Ferries, PA-C  05/24/2022 1:26 PM

## 2022-05-24 NOTE — Plan of Care (Signed)

## 2022-05-24 NOTE — Progress Notes (Signed)
TR[facility] night cross cover note:   I was notified by RN that telemetry had indicated a 25 beat run of nonsustained V. tach around 2300 on 05/23/2022.  Patient asleep at that time.  Vital signs otherwise stable, including normotensive blood pressure.    Patient with a noted history of paroxysmal atrial fibrillation/flutter, while also noting that her home AV nodal blocking agents in the form of metoprolol tartrate 50 mg p.o. twice daily and diltiazem 300 daily have been resumed.  In further evaluating the above run of V. tach, I have ordered stat BMP and serum magnesium levels.    BMP notable for serum potassium level of 4.1.  Serum magnesium level found to be 1.3.  Particularly in the context of the aforementioned run of V. tach, I have ordered initial supplementation in the form of magnesium sulfate 2 g IV over 2 hours x 1 dose now.      Babs Bertin, DO Hospitalist

## 2022-05-24 NOTE — Progress Notes (Signed)
Physical Therapy Treatment Patient Details Name: [name] MRN: [id number] DOB: 06-Aug-1961 Today's Date: 05/24/2022   History of Present Illness 61 y.o. female presents to [facility] hospital on 05/22/2022 after fall at home. Work-up reveals AKI and  low-grade tearing involving the left obturator externus muscle. 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    PT Comments    Pt received in bed agreeable to participation in therapy.  She required min guard assist bed mobility, min assist sit to stand, and min guard assist ambulation 125' with RW. Pt in a fib at beginning of session converting to NSR during ambulation. HR in 90s at rest. Sustained 127-129 during mobility with intermittent increases into 140s. Pt in recliner with feet elevated at end of session, HR in 90s in NSR.    Recommendations for follow up therapy are one component of a multi-disciplinary discharge planning process, led by the attending physician.  Recommendations may be updated based on patient status, additional functional criteria and insurance authorization.  Follow Up Recommendations  Home health PT     Assistance Recommended at Discharge Intermittent Supervision/Assistance  Patient can return home with the following A little help with walking and/or transfers;A little help with bathing/dressing/bathroom;Assistance with cooking/housework;Help with stairs or ramp for entrance   Equipment Recommendations  Rolling walker (2 wheels)    Recommendations for Other Services       Precautions / Restrictions Precautions Precautions: Fall;Other (comment) Precaution Comments: watch HR Restrictions LLE Weight Bearing: Weight bearing as tolerated     Mobility  Bed Mobility Overal bed mobility: Needs Assistance Bed Mobility: Supine to Sit     Supine to sit: Min guard, HOB elevated     General bed mobility comments:  +rail, increased time    Transfers Overall transfer level: Needs assistance Equipment used: Rolling walker (2 wheels) Transfers: Sit to/from Stand Sit to Stand: Min assist           General transfer comment: increased time, assist to power up    Ambulation/Gait Ambulation/Gait assistance: Min guard Gait Distance (Feet): 125 Feet Assistive device: Rolling walker (2 wheels) Gait Pattern/deviations: Step-through pattern, Decreased stride length, Antalgic, Step-to pattern Gait velocity: decreased Gait velocity interpretation: <1.31 ft/sec, indicative of household ambulator   General Gait Details: cues for posture and proximity to RW. Slow, guarded gait. In a fib. HR sustained at 129. Intermittent episodes into 140s.   Stairs             Wheelchair Mobility    Modified Rankin (Stroke Patients Only)       Balance Overall balance assessment: Needs assistance Sitting-balance support: No upper extremity supported, Feet supported Sitting balance-Leahy Scale: Good     Standing balance support: Bilateral upper extremity supported, Reliant on assistive device for balance, During functional activity Standing balance-Leahy Scale: Poor                              Cognition Arousal/Alertness: Awake/alert Behavior During Therapy: WFL for tasks assessed/performed Overall Cognitive Status: Within Functional Limits for tasks assessed                                          Exercises      General Comments General comments (skin integrity, edema, etc.): HR sustained 127-129 during  amb. Intermittent episodes into 140s. Recovers quickly with rest. HR in 90s in recliner at end of session.      Pertinent Vitals/Pain Pain Assessment Pain Assessment: 0-10 Pain Score: 6  Pain Location: L hip Pain Descriptors / Indicators: Grimacing, Guarding, Discomfort Pain Intervention(s): Monitored during session, Repositioned    Home Living                           Prior Function            PT Goals (current goals can now be found in the care plan section) Acute Rehab PT Goals Patient Stated Goal: home Progress towards PT goals: Progressing toward goals    Frequency    Min 3X/week      PT Plan Current plan remains appropriate    Co-evaluation              AM-PAC PT "6 Clicks" Mobility   Outcome Measure  Help needed turning from your back to your side while in a flat bed without using bedrails?: A Little Help needed moving from lying on your back to sitting on the side of a flat bed without using bedrails?: A Little Help needed moving to and from a bed to a chair (including a wheelchair)?: A Little Help needed standing up from a chair using your arms (e.g., wheelchair or bedside chair)?: A Little Help needed to walk in hospital room?: A Little Help needed climbing 3-5 steps with a railing? : Total 6 Click Score: 16    End of Session Equipment Utilized During Treatment: Gait belt Activity Tolerance: Patient tolerated treatment well Patient left: in chair;with call bell/phone within reach Nurse Communication: Mobility status PT Visit Diagnosis: Other abnormalities of gait and mobility (R26.89);Muscle weakness (generalized) (M62.81);Pain Pain - Right/Left: Left Pain - part of body: Hip     Time: 1610-9604 PT Time Calculation (min) (ACUTE ONLY): 24 min  Charges:  $Gait Training: 23-37 mins                     Lorrin Goodell, [place]  Office # [phone] Pager [phone]    Lorriane Shire 05/24/2022, 9:11 AM

## 2022-05-25 ENCOUNTER — Inpatient Hospital Stay (HOSPITAL_COMMUNITY): Payer: Medicare Other

## 2022-05-25 DIAGNOSIS — R5381 Other malaise: Secondary | ICD-10-CM | POA: Diagnosis present

## 2022-05-25 DIAGNOSIS — N179 Acute kidney failure, unspecified: Secondary | ICD-10-CM | POA: Diagnosis present

## 2022-05-25 DIAGNOSIS — I5033 Acute on chronic diastolic (congestive) heart failure: Secondary | ICD-10-CM | POA: Diagnosis present

## 2022-05-25 DIAGNOSIS — I3139 Other pericardial effusion (noninflammatory): Secondary | ICD-10-CM

## 2022-05-25 DIAGNOSIS — Z833 Family history of diabetes mellitus: Secondary | ICD-10-CM | POA: Diagnosis not present

## 2022-05-25 DIAGNOSIS — G894 Chronic pain syndrome: Secondary | ICD-10-CM | POA: Diagnosis present

## 2022-05-25 DIAGNOSIS — M25462 Effusion, left knee: Secondary | ICD-10-CM | POA: Diagnosis present

## 2022-05-25 DIAGNOSIS — F419 Anxiety disorder, unspecified: Secondary | ICD-10-CM | POA: Diagnosis present

## 2022-05-25 DIAGNOSIS — I110 Hypertensive heart disease with heart failure: Secondary | ICD-10-CM | POA: Diagnosis present

## 2022-05-25 DIAGNOSIS — R0609 Other forms of dyspnea: Secondary | ICD-10-CM | POA: Diagnosis not present

## 2022-05-25 DIAGNOSIS — M797 Fibromyalgia: Secondary | ICD-10-CM | POA: Diagnosis present

## 2022-05-25 DIAGNOSIS — I472 Ventricular tachycardia, unspecified: Secondary | ICD-10-CM | POA: Diagnosis not present

## 2022-05-25 DIAGNOSIS — I480 Paroxysmal atrial fibrillation: Secondary | ICD-10-CM | POA: Diagnosis present

## 2022-05-25 DIAGNOSIS — I4892 Unspecified atrial flutter: Secondary | ICD-10-CM

## 2022-05-25 DIAGNOSIS — Z885 Allergy status to narcotic agent status: Secondary | ICD-10-CM | POA: Diagnosis not present

## 2022-05-25 DIAGNOSIS — E1140 Type 2 diabetes mellitus with diabetic neuropathy, unspecified: Secondary | ICD-10-CM | POA: Diagnosis present

## 2022-05-25 DIAGNOSIS — Y92009 Unspecified place in unspecified non-institutional (private) residence as the place of occurrence of the external cause: Secondary | ICD-10-CM | POA: Diagnosis not present

## 2022-05-25 DIAGNOSIS — I959 Hypotension, unspecified: Secondary | ICD-10-CM | POA: Diagnosis present

## 2022-05-25 DIAGNOSIS — W19XXXA Unspecified fall, initial encounter: Secondary | ICD-10-CM | POA: Diagnosis not present

## 2022-05-25 DIAGNOSIS — R079 Chest pain, unspecified: Secondary | ICD-10-CM

## 2022-05-25 DIAGNOSIS — M7989 Other specified soft tissue disorders: Secondary | ICD-10-CM

## 2022-05-25 DIAGNOSIS — F32A Depression, unspecified: Secondary | ICD-10-CM | POA: Diagnosis present

## 2022-05-25 DIAGNOSIS — F17210 Nicotine dependence, cigarettes, uncomplicated: Secondary | ICD-10-CM | POA: Diagnosis present

## 2022-05-25 DIAGNOSIS — M5136 Other intervertebral disc degeneration, lumbar region: Secondary | ICD-10-CM | POA: Diagnosis present

## 2022-05-25 DIAGNOSIS — M25552 Pain in left hip: Secondary | ICD-10-CM | POA: Diagnosis present

## 2022-05-25 DIAGNOSIS — E7800 Pure hypercholesterolemia, unspecified: Secondary | ICD-10-CM | POA: Diagnosis present

## 2022-05-25 DIAGNOSIS — M160 Bilateral primary osteoarthritis of hip: Secondary | ICD-10-CM | POA: Diagnosis present

## 2022-05-25 DIAGNOSIS — S76812A Strain of other specified muscles, fascia and tendons at thigh level, left thigh, initial encounter: Secondary | ICD-10-CM | POA: Diagnosis present

## 2022-05-25 DIAGNOSIS — W010XXA Fall on same level from slipping, tripping and stumbling without subsequent striking against object, initial encounter: Secondary | ICD-10-CM | POA: Diagnosis present

## 2022-05-25 DIAGNOSIS — B182 Chronic viral hepatitis C: Secondary | ICD-10-CM | POA: Diagnosis present

## 2022-05-25 DIAGNOSIS — I471 Supraventricular tachycardia: Secondary | ICD-10-CM | POA: Diagnosis not present

## 2022-05-25 LAB — CBC WITH DIFFERENTIAL/PLATELET
Abs Immature Granulocytes: 0.02 10*3/uL (ref 0.00–0.07)
Basophils Absolute: 0.1 10*3/uL (ref 0.0–0.1)
Basophils Relative: 1 %
Eosinophils Absolute: 0.3 10*3/uL (ref 0.0–0.5)
Eosinophils Relative: 5 %
HCT: 31.2 % — ABNORMAL LOW (ref 36.0–46.0)
Hemoglobin: 10.3 g/dL — ABNORMAL LOW (ref 12.0–15.0)
Immature Granulocytes: 0 %
Lymphocytes Relative: 30 %
Lymphs Abs: 2 10*3/uL (ref 0.7–4.0)
MCH: 28.1 pg (ref 26.0–34.0)
MCHC: 33 g/dL (ref 30.0–36.0)
MCV: 85 fL (ref 80.0–100.0)
Monocytes Absolute: 0.7 10*3/uL (ref 0.1–1.0)
Monocytes Relative: 11 %
Neutro Abs: 3.6 10*3/uL (ref 1.7–7.7)
Neutrophils Relative %: 53 %
Platelets: 250 10*3/uL (ref 150–400)
RBC: 3.67 MIL/uL — ABNORMAL LOW (ref 3.87–5.11)
RDW: 15.1 % (ref 11.5–15.5)
WBC: 6.8 10*3/uL (ref 4.0–10.5)
nRBC: 0 % (ref 0.0–0.2)

## 2022-05-25 LAB — COMPREHENSIVE METABOLIC PANEL
ALT: 10 U/L (ref 0–44)
AST: 15 U/L (ref 15–41)
Albumin: 3.2 g/dL — ABNORMAL LOW (ref 3.5–5.0)
Alkaline Phosphatase: 99 U/L (ref 38–126)
Anion gap: 11 (ref 5–15)
BUN: 9 mg/dL (ref 8–23)
CO2: 23 mmol/L (ref 22–32)
Calcium: 8.6 mg/dL — ABNORMAL LOW (ref 8.9–10.3)
Chloride: 104 mmol/L (ref 98–111)
Creatinine, Ser: 1.05 mg/dL — ABNORMAL HIGH (ref 0.44–1.00)
GFR, Estimated: >60 mL/min (ref >60)
Glucose, Bld: 84 mg/dL (ref 70–99)
Potassium: 4 mmol/L (ref 3.5–5.1)
Sodium: 138 mmol/L (ref 135–145)
Total Bilirubin: 0.7 mg/dL (ref 0.3–1.2)
Total Protein: 6.4 g/dL — ABNORMAL LOW (ref 6.5–8.1)

## 2022-05-25 LAB — D-DIMER, QUANTITATIVE: D-Dimer, Quant: 4.41 ug/mL-FEU — ABNORMAL HIGH (ref 0.00–0.50)

## 2022-05-25 LAB — PROTIME-INR
INR: 2.7 — ABNORMAL HIGH (ref 0.8–1.2)
Prothrombin Time: 28.3 seconds — ABNORMAL HIGH (ref 11.4–15.2)

## 2022-05-25 LAB — MAGNESIUM: Magnesium: 1.5 mg/dL — ABNORMAL LOW (ref 1.7–2.4)

## 2022-05-25 MED ORDER — WARFARIN SODIUM 2.5 MG PO TABS
2.5000 mg | ORAL_TABLET | Freq: Once | ORAL | Status: AC
  Administered 2022-05-25: 2.5 mg via ORAL
  Filled 2022-05-25: qty 1

## 2022-05-25 MED ORDER — FUROSEMIDE 10 MG/ML IJ SOLN
40.0000 mg | Freq: Two times a day (BID) | INTRAMUSCULAR | Status: AC
  Administered 2022-05-25 (×2): 40 mg via INTRAVENOUS
  Filled 2022-05-25 (×2): qty 4

## 2022-05-25 MED ORDER — DILTIAZEM HCL ER COATED BEADS 180 MG PO CP24
360.0000 mg | ORAL_CAPSULE | Freq: Every day | ORAL | Status: DC
  Administered 2022-05-25 – 2022-05-26 (×2): 360 mg via ORAL
  Filled 2022-05-25 (×2): qty 2

## 2022-05-25 MED ORDER — IOHEXOL 350 MG/ML SOLN
100.0000 mL | Freq: Once | INTRAVENOUS | Status: AC | PRN
  Administered 2022-05-25: 100 mL via INTRAVENOUS

## 2022-05-25 MED ORDER — MAGNESIUM SULFATE 2 GM/50ML IV SOLN
2.0000 g | Freq: Once | INTRAVENOUS | Status: AC
  Administered 2022-05-25: 2 g via INTRAVENOUS
  Filled 2022-05-25: qty 50

## 2022-05-25 MED ORDER — COLCHICINE 0.6 MG PO TABS
0.6000 mg | ORAL_TABLET | Freq: Two times a day (BID) | ORAL | Status: DC
  Administered 2022-05-25 – 2022-05-26 (×3): 0.6 mg via ORAL
  Filled 2022-05-25 (×3): qty 1

## 2022-05-25 NOTE — Progress Notes (Signed)
Bilateral LE venous duplex study completed. Please see CV Proc for preliminary results.  Quetzali Heinle BS, RVT 05/25/2022 4:05 PM

## 2022-05-25 NOTE — Progress Notes (Signed)
   D-Dimer came back elevated at 4.4 so chest CTA was ordered to rule out PE. CTA was negative for PE but showed faint ground-glass densities in the parahilar regions suggesting possible pulmonary edema as well as small to moderate bilateral pleural effusion (right > left). There were also linear and patch infiltrates in the right parahilar region and both lower lung fields suggesting atelectasis/pneumonia. Discussed with Dr. Radford Pax who recommended keeping patient another day for continued IV Lasix '40mg'$  twice daily. Placed order. Discussed with primary team who also ordered venous dopplers to rule out DVT.  Of note, CTA also showed a 9m pleural bases nodule in the right lower lobe which has increased in size from prior imaging as well as a 12 mm nodular density in the right lower lobe with possible increase in size. She will need repeat CT in 3 months for further evaluation of this. Will defer this to PCP.  CDarreld Mclean PA-C 05/25/2022 3:27 PM

## 2022-05-25 NOTE — Progress Notes (Signed)
Progress Note  Patient Name: [name] Date of Encounter: 05/25/2022  [facility] Cardiologist: Werner Lean, MD   Subjective   No acute overnight events. Patient had good urinary output with the Lasix yesterday and states she feels like she is breathing better. She is still having the chest heaviness but states this is much improved from recent admission. The heaviness is worse when sitting up and better if she lays down. She also has some mild tenderness with palpation of chest pain. She remains in rate controlled atrial flutter. She reports intermittent heart racing but none at this time.  Inpatient Medications    Scheduled Meds:  aspirin EC  81 mg Oral Daily   atorvastatin  20 mg Oral Daily   buPROPion  300 mg Oral q AM   colchicine  0.6 mg Oral Daily   diltiazem  300 mg Oral Daily   estrogens (conjugated)  0.45 mg Oral Daily   ferrous [id number] C-folic acid  1 capsule Oral BID PC   FLUoxetine  20 mg Oral q morning   gabapentin  300 mg Oral TID   magnesium oxide  400 mg Oral BID   methocarbamol  500 mg Oral QID   metoprolol tartrate  50 mg Oral BID   pantoprazole  40 mg Oral Daily   Warfarin - Pharmacist Dosing Inpatient   Does not apply q1600   Continuous Infusions:  PRN Meds: acetaminophen, albuterol, HYDROmorphone (DILAUDID) injection, hydrOXYzine, melatonin, nitroGLYCERIN, oxyCODONE, polyethylene glycol, traZODone   Vital Signs    Vitals:   05/24/22 1151 05/24/22 2037 05/25/22 0029 05/25/22 0529  BP: (!) 149/120  (!) 136/98 (!) 154/96  Pulse: 82 97 84 100  Resp: '17  18 18  '$ Temp: 97.7 F (36.5 C)  97.7 F (36.5 C) 97.7 F (36.5 C)  TempSrc: Oral  Oral Oral  SpO2: 100%  100%   Weight:    91.5 kg  Height:        Intake/Output Summary (Last 24 hours) at 05/25/2022 0623 Last data filed at 05/25/2022 0500 Gross per 24 hour  Intake 198.52 ml  Output 3725 ml  Net -3526.48 ml      05/25/2022    5:29 AM 05/24/2022    5:55 AM  05/22/2022   12:00 PM  Last 3 Weights  Weight (lbs) 201 lb 11.2 oz 209 lb 212 lb  Weight (kg) 91.491 kg 94.802 kg 96.163 kg      Telemetry    Atrial flutter with baseline rates in the 80s to 90s with brief spikes in the 120s (suspect this is when she is exerting herself). Occasional PVC/ventricular couplets also noted. - Personally Reviewed  ECG    No new ECG tracing today. - Personally Reviewed  Physical Exam   GEN: African-American female resting comfortably in no acute distress.   Neck: No JVD. Cardiac: Irregularly irregular rhythm. No murmurs, rubs, or gallops.  Respiratory: Clear to auscultation bilaterally. No wheezes, rhonchi, or rales. GI: Soft, non-distended, and non-tender. MS: No significant lower extremity edema (very slight trace edema on the left). No deformity. Neuro:  No focal deficits. Psych: Normal affect. Responds appropriately.  Labs    High Sensitivity Troponin:   Recent Labs  Lab 05/10/22 0603 05/22/22 1727 05/22/22 2200 05/23/22 0917 05/23/22 1123  TROPONINIHS 33* '11 12 13 14     '$ Chemistry Recent Labs  Lab 05/22/22 1531 05/24/22 0327 05/25/22 0252  NA 138 138 138  K 4.1 4.1 4.0  CL 107 110 104  CO2 22 20* 23  GLUCOSE 127* 77 84  BUN '19 12 9  '$ CREATININE 2.07* 1.14* 1.05*  CALCIUM 7.5* 7.8* 8.6*  MG  --  1.3* 1.5*  PROT 6.3*  --  6.4*  ALBUMIN 3.5  --  3.2*  AST 19  --  15  ALT 11  --  10  ALKPHOS 119  --  99  BILITOT 0.5  --  0.7  GFRNONAA 27* 54* >60  ANIONGAP '9 8 11    '$ Lipids No results for input(s): "CHOL", "TRIG", "HDL", "LABVLDL", "LDLCALC", "CHOLHDL" in the last 168 hours.  Hematology Recent Labs  Lab 05/23/22 1025 05/24/22 0327 05/25/22 0252  WBC 7.2 7.2 6.8  RBC 3.70* 3.41* 3.67*  HGB 10.4* 9.6* 10.3*  HCT 32.4* 29.5* 31.2*  MCV 87.6 86.5 85.0  MCH 28.1 28.2 28.1  MCHC 32.1 32.5 33.0  RDW 15.3 15.1 15.1  PLT 215 230 250   Thyroid No results for input(s): "TSH", "FREET4" in the last 168 hours.  BNP Recent  Labs  Lab 05/24/22 0327  BNP 792.0*    DDimer No results for input(s): "DDIMER" in the last 168 hours.   Radiology    ECHOCARDIOGRAM LIMITED  Result Date: 05/24/2022    ECHOCARDIOGRAM LIMITED REPORT   Patient Name:   LAQUILLA DAULT Date of Exam: 05/24/2022 Medical Rec #:  [id number]         Height:       71.0 in Accession #:    [id number]        Weight:       209.0 lb Date of Birth:  Apr 03, 1961         BSA:          2.148 m Patient Age:    61 years          BP:           152/91 mmHg Patient Gender: F                 HR:           65 bpm. Exam Location:  Inpatient Procedure: Limited Echo, Color Doppler and Cardiac Doppler Indications:    I31.3 Pericardial effusion  History:        Patient has prior history of Echocardiogram examinations, most                 recent 05/13/2022. CHF, Arrythmias:Atrial Fibrillation; Risk                 Factors:Hypertension, Diabetes and Dyslipidemia. 04/19/22 MV and                 TV ring repairs.  Sonographer:    Raquel Sarna Senior RDCS Referring Phys: [place]  1. Left ventricular ejection fraction, by estimation, is 50 to 55%. The left ventricle has low normal function. The left ventricle demonstrates regional wall motion abnormalities (see scoring diagram/findings for description). There is mild concentric left ventricular hypertrophy. There is hypokinesis of the left ventricular, basal-mid inferior wall.  2. Right ventricular systolic function is moderately reduced. There is moderately elevated pulmonary artery systolic pressure.  3. Left atrial size was severely dilated.  4. Right atrial size was moderately dilated.  5. The mitral valve has been repaired/replaced. Mild mitral valve regurgitation. Mild to moderate mitral stenosis. The mean mitral valve gradient is 9.0 mmHg.  6. The tricuspid valve is has been repaired/replaced. Tricuspid valve regurgitation is mild.  7. The aortic valve  is bicuspid. There is mild calcification of the aortic valve.  8.  The inferior vena cava is dilated in size with <50% respiratory variability, suggesting right atrial pressure of 15 mmHg.  9. No pericardial effusion. FINDINGS  Left Ventricle: Left ventricular ejection fraction, by estimation, is 50 to 55%. The left ventricle has low normal function. The left ventricle demonstrates regional wall motion abnormalities. There is mild concentric left ventricular hypertrophy. Right Ventricle: Right ventricular systolic function is moderately reduced. There is moderately elevated pulmonary artery systolic pressure. The tricuspid regurgitant velocity is 3.15 m/s, and with an assumed right atrial pressure of 15 mmHg, the estimated right ventricular systolic pressure is 00.7 mmHg. Left Atrium: Left atrial size was severely dilated. Right Atrium: Right atrial size was moderately dilated. Mitral Valve: The mitral valve has been repaired/replaced. Mild mitral valve regurgitation. 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 Mild to moderate mitral valve stenosis. MV peak gradient, 19.8 mmHg. The mean mitral valve  gradient is 9.0 mmHg. Tricuspid Valve: The tricuspid valve is has been repaired/replaced. Tricuspid valve regurgitation is mild. Aortic Valve: The aortic valve is bicuspid. There is mild calcification of the aortic valve. Pulmonic Valve: Pulmonic valve regurgitation is trivial. Venous: The inferior vena cava is dilated in size with less than 50% respiratory variability, suggesting right atrial pressure of 15 mmHg. AORTIC VALVE LVOT Vmax:   85.50 cm/s LVOT Vmean:  53.900 cm/s LVOT VTI:    0.166 m MITRAL VALVE              TRICUSPID VALVE MV Peak grad: 19.8 mmHg   TV Peak grad:   4.0 mmHg MV Mean grad: 9.0 mmHg    TV Mean grad:   3.0 mmHg MV Vmax:      2.22 m/s    TV Vmax:        1.00 m/s MV Vmean:     141.3 cm/s  TV Vmean:       83.1 cm/s MR Peak grad: 104.4 mmHg  TV VTI:         0.26 msec MR Mean grad: 76.0 mmHg   TR Peak grad:   39.7 mmHg MR Vmax:       511.00 cm/s TR Vmax:        315.00 cm/s MR Vmean:     420.0 cm/s                           SHUNTS                           Systemic VTI: 0.17 m Glori Bickers MD Electronically signed by Glori Bickers MD Signature Date/Time: 05/24/2022/4:00:46 PM    Final     Cardiac Studies   Limited Echo 05/24/2022: Impressions:  1. Left ventricular ejection fraction, by estimation, is 50 to 55%. The  left ventricle has low normal function. The left ventricle demonstrates  regional wall motion abnormalities (see scoring diagram/findings for  description). There is mild concentric  left ventricular hypertrophy. There is hypokinesis of the left  ventricular, basal-mid inferior wall.   2. Right ventricular systolic function is moderately reduced. There is  moderately elevated pulmonary artery systolic pressure.   3. Left atrial size was severely dilated.   4. Right atrial size was moderately dilated.   5. The mitral valve has been repaired/replaced. Mild mitral valve  regurgitation. Mild to moderate mitral stenosis. The mean  mitral valve  gradient is 9.0 mmHg.   6. The tricuspid valve is has been repaired/replaced. Tricuspid valve  regurgitation is mild.   7. The aortic valve is bicuspid. There is mild calcification of the  aortic valve.   8. The inferior vena cava is dilated in size with <50% respiratory  variability, suggesting right atrial pressure of 15 mmHg.   9. No pericardial effusion.   Patient Profile     61 y.o. female with a history of normal coronaries on recent cardiac catheterization in 02/2022, severe mitral vegetation and tricuspid regurgitation s/p recent mitral valve and tricuspid valve repairs on 04/19/2022, large pericardial effusion with tamponade s/p emergent pericardiocentesis on 05/10/2022, persistent atrial flutter on Coumadin, hypertension, type 2 diabetes mellitus with neuropathy, GERD, hepatitis C, fibromyalgia, chronic pain, and remote substance abuse.  Patient was recently  discharged on 05/17/2022 after being admitted for a large pericardial effusion with evidence of tamponade.  She underwent emergent pericardiocentesis and remained in the CCU for several days with continued pericardial drainage.  Started on Colchicine for ongoing chest pain.  This admission was complicated by new onset atrial flutter.  She presented back to the ED on 05/22/2022 after a mechanical fall (she slipped when getting out of the bed and laid on the floor for over an hour).  Cardiology was consulted for further evaluation of ongoing atrial flutter, NSVT, and ongoing chest pain.  Assessment & Plan    Acute on Chronic Diastolic CHF BNP elevated at 792.  Chest x-ray showed cardiomegaly with vascular congestion and mild edema with progression since last radiograph.  Repeat limited Echo on 6/19 showed LVEF of 50-55% with hypokinesis of the basal-mid inferior wall, moderately reduced RV function, biatrial enlargement (left > right), mild to moderate mitral stenosis/mild mitral regurgitation with mean gradient of 9.0 mmHg, mild tricuspid regurgitation, and moderately elevated PASP.  No pericardial effusion was seen.  She was received one dose of IV Lasix '40mg'$  on 6/19 with good urinary response. Documented 3.3 L of urinary output yesterday and net negative 2.8 L this admission.  Weight 201 lbs today, down from 209 lbs yesterday, 212 lbs on admission, and 219 lbs on 05/11/2022. Renal function improving. - Appears euvolemic on exam today. She does state she feels like she is breathing better after dose of Lasix yesterday. - Will defer additional dose of IV Lasix to MD. - Continue to monitor daily weights, strict I/Os, and renal function.  Moderate Pulmonary Hypertension RV Dysfunction Echo this admission showed moderately reduced RV systolic function with moderately elevated PASP of 54.7 mmHg.  This is new as RV function was normal no prior Echoes. - Will check a D-dimer to rule out PE. - Hopefully will  improve with diuresis.  Acute Pericarditis Recent Large Pericardial Effusion with Tamponade s/p Pericardiocentesis on 05/10/2022 Repeat Echo this admission showed no evidence of pericardial effusion. She continues to complain of chest pain but much improved from recent discharge.  - Colchicine was increased back to 0.'6mg'$  twice daily on 05/24/2022. Continue. - She had normal coronary arteries on cardiac catheterization in 02/2022 so do not think her pain is coronary ischemia.  Persistent Atrial Flutter Remains in the atrial flutter with rates in the 80s to 90s at rest with brief spikes in the 120s (suspect this is with more exertion). This has been continuous since POD#8 following valve surgery. - Will increase Cardizem CD to '360mg'$  daily for additional BP control. - Continue Lopressor '50mg'$  twice daily.  - Continue anticoagulation with  Coumadin. INR 2.7 today. Dosing per Pharmacy. Plan is for outpatient cardioversion once she has had a therapeutic INR for > 3 weeks.  Non-Sustained VT Had a 25 second run of NSVT on 05/23/2022 in the setting of hypomagnesemia. No additional long runs of NSVT over the last 24 hours - she has had occasional PVCs and some ventricular couplets. - Magnesium 1.5 today. Will replete. - Potassium  4.0 today. - Continue Cardizem CD and Lopressor as above.  Severe Mitral Regurgitation and Severe Tricuspid Regurgitation s/p Mitral and Tricuspid Valve Repair on 04/19/2022 Repeat Echo this admission showed mild to moderate mitral stenosis and mild mitral regurgitation with mean gradient of 9 mmHg which is up from 6.4 mmHg on 05/13/2022. Also showed mild tricuspid regurgitation. - Mitral valve gradient will need to continue to be followed.  Hypertension BP mildly elevated. - Will increase Cardizem CD to '360mg'$  daily. - Continue Lopressor '50mg'$  twice daily.  AKI Creatinine 2.07 on admission. Baseline 0.92. Initially was felt to be due to diuretics and she was treated with IV fluids  but then she was felt to be volume overloaded and was given one dose of IV Lasix on 6/19. - Creatinine 1.05 today. - Continue to monitor closely.  Otherwise, per primary team: - Type 2 diabetes - Muscular strain of left leg - Mild left knee effusion - Chronic anxiety/depression  - Chronic pain   For questions or updates, please contact [facility] Please consult [email or web address] for contact info under        Signed, Darreld Mclean, PA-C  05/25/2022, 6:23 AM

## 2022-05-25 NOTE — Progress Notes (Signed)
ANTICOAGULATION CONSULT NOTE - Follow up [place] for Warfarin Indication: atrial fibrillation  Allergies  Allergen Reactions   Hydrocodone-Acetaminophen Itching   Hydrocodone Itching    Tolerates oxycodone     Patient Measurements: Height: '5\' 11"'$  (180.3 cm) Weight: 91.5 kg (201 lb 11.2 oz) IBW/kg (Calculated) : 70.8  Vital Signs: Temp: 97.7 F (36.5 C) (06/20 0529) Temp Source: Oral (06/20 0529) BP: 154/96 (06/20 0529) Pulse Rate: 100 (06/20 0529)  Labs: Recent Labs    05/22/22 1531 05/22/22 1727 05/22/22 1727 05/22/22 2200 05/23/22 0917 05/23/22 1025 05/23/22 1123 05/24/22 0327 05/25/22 0252  HGB 10.1*  --   --   --   --  10.4*  --  9.6* 10.3*  HCT 32.5*  --   --   --   --  32.4*  --  29.5* 31.2*  PLT 202  --   --   --   --  215  --  230 250  LABPROT  --  19.7*   < >  --   --  20.0*  --  24.3* 28.3*  INR  --  1.7*   < >  --   --  1.7*  --  2.2* 2.7*  CREATININE 2.07*  --   --   --   --   --   --  1.14* 1.05*  CKTOTAL  --  153  --   --   --   --   --   --   --   TROPONINIHS  --  11   < > 12 13  --  14  --   --    < > = values in this interval not displayed.     Estimated Creatinine Clearance: 69.4 mL/min (A) (by C-G formula based on SCr of 1.05 mg/dL (H)).   Medical History: Past Medical History:  Diagnosis Date   Acute pericardial effusion 05/17/2022   AKI (acute kidney injury) ([place]) 05/17/2022   Anxiety    Arthritis    Cervicalgia    Chondromalacia of patella    Chronic neck pain    Chronic pain syndrome    Depression    secondary to loss of her son at age 61   Diabetes mellitus    Diabetic neuropathy ([place])    DMII (diabetes mellitus, type 2) ([place])    Dysrhythmia    Enthesopathy of hip region    Fibromyalgia    GERD (gastroesophageal reflux disease)    Hep C w/o coma, chronic ([place])    Hepatitis C    diagnosed 2005   Hypertension    Insomnia    Insomnia    Low back pain    Lumbago    Mitral regurgitation    Obesity     Pain in joint, upper arm    Primary localized osteoarthrosis, lower leg    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   Substance abuse ([place])    sober since 2001   Tobacco abuse    Tricuspid regurgitation    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   Past Week   albuterol (PROVENTIL HFA;VENTOLIN HFA) 108 (90 Base) MCG/ACT inhaler Inhale 2 puffs into the lungs every 6 (six) hours as needed for wheezing or shortness of breath.  Past Week   aspirin 81 MG EC tablet Take 81 mg by mouth daily.   05/21/2022   atorvastatin (LIPITOR) 20 MG tablet Take 20 mg by mouth daily.   05/21/2022   buPROPion (WELLBUTRIN XL) 300 MG 24 hr tablet Take 300 mg by mouth in the morning.   05/21/2022   colchicine 0.6 MG tablet Take 1 tablet (0.6 mg total) by mouth daily. 60 tablet 2 05/21/2022   Cyanocobalamin (VITAMIN B 12 PO) Take 500 mcg by mouth daily.   Past Week   diltiazem (CARDIZEM CD) 300 MG 24 hr capsule Take 1 capsule (300 mg total) by mouth daily. 30 capsule 6 05/21/2022   ferrous [id number] C-folic acid (TRINSICON / FOLTRIN) capsule Take 1 capsule by mouth 2 (two) times daily after a meal.   Past Week   FLUoxetine (PROZAC) 20 MG capsule Take 20 mg by mouth every morning.   05/21/2022   furosemide (LASIX) 40 MG tablet Take 40 mg by mouth 2 (two) times daily.   05/21/2022   gabapentin (NEURONTIN) 600 MG tablet Take 600 mg by mouth 3 (three) times daily.   05/21/2022   hydrOXYzine (ATARAX) 25 MG tablet Take 25 mg by mouth every 4 (four) hours as needed for anxiety.   05/21/2022   linagliptin (TRADJENTA) 5 MG TABS tablet Take 5 mg by mouth in the morning.   05/21/2022   methocarbamol (ROBAXIN) 500 MG tablet Take 500 mg by mouth 4 (four) times daily.   05/21/2022   metoprolol tartrate (LOPRESSOR) 50 MG  tablet Take 1 tablet (50 mg total) by mouth 2 (two) times daily. 60 tablet 6 05/21/2022 at 1300   nystatin ointment (MYCOSTATIN) Apply topically 2 (two) times daily.   Past Month   omeprazole (PRILOSEC) 20 MG capsule Take 20 mg by mouth daily.   05/21/2022   oxyCODONE (ROXICODONE) 15 MG immediate release tablet Take 15 mg by mouth every 4 (four) hours as needed for pain.   Past Week   PREMARIN 0.45 MG tablet Take 0.45 mg by mouth daily.   05/21/2022   traZODone (DESYREL) 100 MG tablet Take 2.5 tablets (250 mg total) by mouth at bedtime as needed for sleep. 225 tablet 0 Past Week   warfarin (COUMADIN) 5 MG tablet Take 1 tablet (5 mg total) by mouth daily.   05/21/2022 at [facility] 1 each by Does not apply route daily. Check blood sugar once daily. 250.00 102 each 4    glucose blood (ACCU-CHEK SMARTVIEW) test strip Check blood sugar once daily. 250.00 50 each 12    naloxone (NARCAN) nasal spray 4 mg/0.1 mL Place 1 spray into the nose once.      Scheduled:   aspirin EC  81 mg Oral Daily   atorvastatin  20 mg Oral Daily   buPROPion  300 mg Oral q AM   colchicine  0.6 mg Oral Daily   diltiazem  300 mg Oral Daily   estrogens (conjugated)  0.45 mg Oral Daily   ferrous [id number] C-folic acid  1 capsule Oral BID PC   FLUoxetine  20 mg Oral q morning   gabapentin  300 mg Oral TID   magnesium oxide  400 mg Oral BID   methocarbamol  500 mg Oral QID   metoprolol tartrate  50 mg Oral BID   pantoprazole  40 mg Oral Daily   Warfarin - Pharmacist Dosing Inpatient   Does not apply q1600   Infusions:  PRN: acetaminophen, albuterol,  HYDROmorphone (DILAUDID) injection, hydrOXYzine, melatonin, nitroGLYCERIN, oxyCODONE, polyethylene glycol, traZODone  Assessment: 61 yoF with recent MV and TV repair 5/16 c/b POAF, started on warfarin. Pt with early readmission for pericardial effusion and tmaponade requiring [facility] for warfarin reversal and pericardiocentesis 6/5. Pt now  readmitted with fall 6/17. CT head negative, INR 1.7 on admit.  INR today is therapeutic at 2.7, CBC stable.  Note home warfarin dose initially '5mg'$  T/R/Sat + 2.'5mg'$  M/W/F/Sun, then subsequently discharged on '5mg'$  daily after last admission. It appears pt has not had INR checked in the outpatient setting since initiating warfarin.  Goal of Therapy:  INR 2-3 Monitor platelets by anticoagulation protocol: Yes   Plan:  Warfarin 2.'5mg'$  PO x1 Daily protime  Arrie Senate, PharmD, BCPS, [facility] Clinical Pharmacist [phone] Please check AMION for all [facility] Pharm[facility] numbers 05/25/2022

## 2022-05-25 NOTE — Progress Notes (Signed)
PROGRESS NOTE    [name]  FKC:[id number] DOB: 03/29/1961 DOA: 05/22/2022 PCP: [name]    Brief Narrative:  [name] is a 61 y.o. female with medical history significant for severe mitral insufficiency and tricuspid insufficiency status post mitral valve and tricuspid valve repair on 04/19/2022 by Dr. Roxan Hockey, recent large pericardial effusion with tamponade physiology status post pericardiocentesis on 05/10/2022 paroxysmal A-fib on Coumadin, essential hypertension, hyperlipidemia, prediabetes, GERD, chronic anxiety/depression, chronic normocytic anemia, hepatitis C, tobacco use, chronic pain syndrome, presented to the hospital after sustaining a fall at home ground-level with impact on the left lower extremity.  In the ED, patient had acute kidney injury.  X-ray of the pelvis without any fractures.  MRI of the pelvis was performed which showed  extensive muscle strain with low-grade tearing involving the left obturator externus muscle without tendon disruption or intramuscular hematoma.  Muscle strain with low-grade tear involving the inferior aspect of the distal left gluteus minimus muscle.  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  Degenerative facet arthropathy of L4-5 and L5-S1 with associated reactive subchondral bone marrow edema.  Initial vitals were stable except for mild tachypnea.  Labs showed creatinine of 2.0 and hemoglobin of 10.1.  Patient received Ringer lactate, fentanyl, morphine and was considered for admission to hospital for further evaluation and treatment.  During hospitalization, cardiology was consulted due to chest discomfort recent cardiac surgery.  Cardiology adjusting medication.  CTA performed showed no evidence of PE but effusion and congestion.  Patient has been started on IV Lasix, check venous duplex.  Assessment and plan.  Principal Problem:   AKI (acute kidney injury) ([place]) Active Problems:   Type 2 diabetes  mellitus without complication, without long-term current use of insulin (HCC)   Chronic pain syndrome   Depression   DDD (degenerative disc disease), lumbar   History of atrial fibrillation   Hypercholesterolemia   DOE (dyspnea on exertion)   Acute kidney injury on diuretics due to chronic systolic CHF Baseline creatinine around 0.9.  Creatinine of 2.0 on presentation.   Received gentle IV fluids.  Latest creatinine of 1.1.  Cardiology has seen the patient and recommended initiation of IV Lasix during hospitalization due to CT scan showing bilateral effusion.  Plan for Lasix Lasix 20 mg daily on discharge.  Hypomagnesemia had episodes of non sustained tachycardia.  Magnesium of 1.3 initially and received magnesium sulfate IV and oral.  Magnesium of 1.5 today and has received magnesium sulfate IV again.  We will continue magnesium oxide as well.  Shortness of breath, chest discomfort.  Negative troponins.  EKG with atrial flutter.  Patient had a recent 2D echocardiogram on 05/13/2022 with LV ejection fraction 45 to 50% with regional wall motion abnormality on the left ventricle.  History of pericardiocentesis, mitral valve repair.  Patient was seen by cardiology during hospitalization and recommended increasing colchicine to twice a day.  Recommendation is 3 months of colchicine.  Dose of Cardizem has been increased to 360 mg daily due to hypotension.  D-dimer was elevated so CT angiogram of the chest was performed which was negative for PE but bilateral effusion.  Communicated with cardiology who recommends initiation of IV Lasix while in the hospital.  We will also get venous Doppler of the lower extremities.  She will need to follow-up with cardiology as outpatient on discharge..  Muscular strain with low-grade tear seen on noncontrast MRI pelvis Patient complains of difficulty ambulating and pain on the left leg.  MRI of  the lower extremity showed extensive muscle strain with low-grade tearing  involving the left obturator externus muscle without tendon disruption or intramuscular hematoma.  Muscle strain with low-grade tear involving the inferior aspect of the distal left gluteus minimus muscle.  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  Degenerative facet arthropathy of L4-5 and L5-S1 with associated reactive subchondral bone marrow edema.  PT OT has recommended home health PT on discharge. Continue muscle relaxant, oxycodone on discharge.  CK level of 153.   Mild left knee effusion, POA Continue supportive care.  Ice packs. continue PT as outpatient.   Essential hypertension Continue metoprolol, Cardizem.  Cardizem dose has been increased at this time.   Hyperlipidemia Continue statins   Chronic anxiety/depression Continue Prozac, gabapentin and Atarax    Status post recent mitral valve and tricuspid valve repair 04/19/2022 Paroxysmal A-fib on Coumadin  INR of 2.7 today.  Resume Coumadin on discharge   History of large pericardial effusion status post pericardiocentesis Follows with cardiology ad outpatient.  On colchicine as outpatient.  Has been increased to twice a day and will need to continue for 3 more months.  Patient will follow-up with cardiology as outpatient.  Diabetes mellitus type 2.  Latest hemoglobin A1c of 6.1. on diabetic diet.  On linagliptin as outpatient.   Chronic systolic CHF 16-07% Will resume Lasix 20 mg daily on discharge.  Non sustained ventricular tachycardia.  Received magnesium IV.  No further episodes.  Falls, debility weakness.  PT evaluation has recommended home health PT on discharge.      DVT prophylaxis:   Coumadin warfarin (COUMADIN) tablet 2.5 mg   Code Status:     Code Status: Full Code  Disposition:  Home with home health likely in 1 to 2 days  Status is: Inpatient  The patient is inpatient  because of chest discomfort, multiple comorbidities, IV diuresis, cardiology follow-up   Family Communication:   Communicated with the patient at bedside.  Consultants:  Cardiology  Procedures:  CTA chest  Antimicrobials:  None  Anti-infectives (From admission, onward)    None       Subjective: Today, patient was seen and examined at bedside.  Has mild chest discomfort.  Seen by cardiology.  Less pain on the extremity today.    Objective: Vitals:   05/25/22 0529 05/25/22 0853 05/25/22 0900 05/25/22 1136  BP: (!) 154/96 (!) 133/98 (!) 141/95 136/90  Pulse: 100 91 (!) 107 92  Resp: '18 17 18 14  '$ Temp: 97.7 F (36.5 C) 98.4 F (36.9 C)  98 F (36.7 C)  TempSrc: Oral Oral  Oral  SpO2:  96% 97% 92%  Weight: 91.5 kg     Height:        Intake/Output Summary (Last 24 hours) at 05/25/2022 1440 Last data filed at 05/25/2022 0851 Gross per 24 hour  Intake 120 ml  Output 3950 ml  Net -3830 ml   Filed Weights   05/22/22 1200 05/24/22 0555 05/25/22 0529  Weight: 96.2 kg 94.8 kg 91.5 kg    Physical Examination: Body mass index is 28.13 kg/m.   General:  Average built, not in obvious distress, alert awake and communicative. HENT:   No scleral pallor or icterus noted. Oral mucosa is moist.  Chest:  Clear breath sounds.  Diminished breath sounds bilaterally. No crackles or wheezes.  CVS: S1 &S2 heard.  Midline sternal scar noted. Abdomen: Soft, nontender, nondistended.  Bowel sounds are heard.   Extremities: No cyanosis, clubbing or edema.  Peripheral pulses are palpable.  Mild left knee tenderness Psych: Alert, awake and oriented, normal mood CNS:  No cranial nerve deficits.  Left lower extremity mild weakness. Skin: Warm and dry.  No rashes noted.  Data Reviewed:   CBC: Recent Labs  Lab 05/22/22 1531 05/23/22 1025 05/24/22 0327 05/25/22 0252  WBC 6.3 7.2 7.2 6.8  NEUTROABS 4.3 5.2 4.5 3.6  HGB 10.1* 10.4* 9.6* 10.3*  HCT 32.5* 32.4* 29.5* 31.2*  MCV 90.3 87.6 86.5 85.0  PLT 202 215 230 092    Basic Metabolic Panel: Recent Labs  Lab 05/22/22 1531 05/24/22 0327  05/25/22 0252  NA 138 138 138  K 4.1 4.1 4.0  CL 107 110 104  CO2 22 20* 23  GLUCOSE 127* 77 84  BUN '19 12 9  '$ CREATININE 2.07* 1.14* 1.05*  CALCIUM 7.5* 7.8* 8.6*  MG  --  1.3* 1.5*    Liver Function Tests: Recent Labs  Lab 05/22/22 1531 05/25/22 0252  AST 19 15  ALT 11 10  ALKPHOS 119 99  BILITOT 0.5 0.7  PROT 6.3* 6.4*  ALBUMIN 3.5 3.2*     Radiology Studies: CT Angio Chest Pulmonary Embolism (PE) W or WO Contrast  Result Date: 05/25/2022 CLINICAL DATA:  Pulmonary embolism suspected, high clinical probability EXAM: CT ANGIOGRAPHY CHEST WITH CONTRAST TECHNIQUE: Multidetector CT imaging of the chest was performed using the standard protocol during bolus administration of intravenous contrast. Multiplanar CT image reconstructions and MIPs were obtained to evaluate the vascular anatomy. RADIATION DOSE REDUCTION: This exam was performed according to the departmental dose-optimization program which includes automated exposure control, adjustment of the mA and/or kV according to patient size and/or use of iterative reconstruction technique. CONTRAST:  138m OMNIPAQUE IOHEXOL 350 MG/ML SOLN COMPARISON:  Previous chest radiographs including the examination done on 05/22/2022, CT chest done on 12/18/2020 FINDINGS: Cardiovascular: There is metallic density in the region of mitral and tricuspid valves possibly suggesting previous prosthetic valve placement. Contrast density in thoracic aorta is less than adequate to evaluate the lumen. There are no intraluminal filling defects in the pulmonary artery branches. Evaluation of small peripheral branches in the lower lung fields is limited by pleural effusions and infiltrates. There is reflux of contrast into inferior vena cava and hepatic veins. Mediastinum/Nodes: No new significant lymphadenopathy seen. There is 1.6 cm low-density nodule in the left lobe of thyroid. Lungs/Pleura: Small to moderate bilateral pleural effusions are seen, more so on the  right side. There is no pneumothorax. There are faint ground-glass densities in the parahilar regions and lower lung fields suggesting possible interstitial edema. There are small patchy infiltrates in the posterior lower lung fields, more so on the right side. There are linear densities in the right parahilar region. In the image 97 of series 7, there is 8 mm pleural-based nodule in the right lower lobe which measured 4 mm in the previous study. In the image 106 there is 12 mm nodular density in the lateral aspect of right lower lung fields. In the previous study there was a 9 mm nodule in the same region. Evaluation of this nodule in the current study however is less than optimal due to adjacent infiltrate in right lower lobe. Upper Abdomen: No significant abnormality is seen. Musculoskeletal: Degenerative changes are noted in the thoracic spine, particularly severe at T2-T3 and T11-T12 levels. Similar finding was seen in the previous study. Review of the MIP images confirms the above findings. IMPRESSION: There is no evidence of pulmonary artery embolism.  There are faint ground-glass densities in the parahilar regions suggesting possible pulmonary edema. There are linear and patchy infiltrates in the right parahilar region and both lower lung fields suggesting atelectasis/pneumonia. Small to moderate bilateral pleural effusions, more so on the right side. There is 8 mm pleural-based nodule in the right lower lobe which has increased in size. There is 12 mm nodular density in the right lower lobe with possible increase in size. Adjacent infiltrate in the right lower lobe limits evaluation of interval change. Short-term follow-up CT chest in the 3 months may be considered to re-evaluate the nodules in the right lower lobe. Other findings as described in the body of the report. Electronically Signed   By: Elmer Picker M.D.   On: 05/25/2022 13:35   ECHOCARDIOGRAM LIMITED  Result Date: 05/24/2022     ECHOCARDIOGRAM LIMITED REPORT   Patient Name:   [name] Date of Exam: 05/24/2022 Medical Rec #:  [id number]         Height:       71.0 in Accession #:    [id number]        Weight:       209.0 lb Date of Birth:  22-Dec-1960         BSA:          2.148 m Patient Age:    89 years          BP:           152/91 mmHg Patient Gender: F                 HR:           65 bpm. Exam Location:  Inpatient Procedure: Limited Echo, Color Doppler and Cardiac Doppler Indications:    I31.3 Pericardial effusion  History:        Patient has prior history of Echocardiogram examinations, most                 recent 05/13/2022. CHF, Arrythmias:Atrial Fibrillation; Risk                 Factors:Hypertension, Diabetes and Dyslipidemia. 04/19/22 MV and                 TV ring repairs.  Sonographer:    Raquel Sarna Senior RDCS Referring Phys: [place]  1. Left ventricular ejection fraction, by estimation, is 50 to 55%. The left ventricle has low normal function. The left ventricle demonstrates regional wall motion abnormalities (see scoring diagram/findings for description). There is mild concentric left ventricular hypertrophy. There is hypokinesis of the left ventricular, basal-mid inferior wall.  2. Right ventricular systolic function is moderately reduced. There is moderately elevated pulmonary artery systolic pressure.  3. Left atrial size was severely dilated.  4. Right atrial size was moderately dilated.  5. The mitral valve has been repaired/replaced. Mild mitral valve regurgitation. Mild to moderate mitral stenosis. The mean mitral valve gradient is 9.0 mmHg.  6. The tricuspid valve is has been repaired/replaced. Tricuspid valve regurgitation is mild.  7. The aortic valve is bicuspid. There is mild calcification of the aortic valve.  8. The inferior vena cava is dilated in size with <50% respiratory variability, suggesting right atrial pressure of 15 mmHg.  9. No pericardial effusion. FINDINGS  Left Ventricle:  Left ventricular ejection fraction, by estimation, is 50 to 55%. The left ventricle has low normal function. The left ventricle demonstrates regional wall motion abnormalities. There is mild concentric left  ventricular hypertrophy. Right Ventricle: Right ventricular systolic function is moderately reduced. There is moderately elevated pulmonary artery systolic pressure. The tricuspid regurgitant velocity is 3.15 m/s, and with an assumed right atrial pressure of 15 mmHg, the estimated right ventricular systolic pressure is 79.0 mmHg. Left Atrium: Left atrial size was severely dilated. Right Atrium: Right atrial size was moderately dilated. Mitral Valve: The mitral valve has been repaired/replaced. Mild mitral valve regurgitation. 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 Mild to moderate mitral valve stenosis. MV peak gradient, 19.8 mmHg. The mean mitral valve  gradient is 9.0 mmHg. Tricuspid Valve: The tricuspid valve is has been repaired/replaced. Tricuspid valve regurgitation is mild. Aortic Valve: The aortic valve is bicuspid. There is mild calcification of the aortic valve. Pulmonic Valve: Pulmonic valve regurgitation is trivial. Venous: The inferior vena cava is dilated in size with less than 50% respiratory variability, suggesting right atrial pressure of 15 mmHg. AORTIC VALVE LVOT Vmax:   85.50 cm/s LVOT Vmean:  53.900 cm/s LVOT VTI:    0.166 m MITRAL VALVE              TRICUSPID VALVE MV Peak grad: 19.8 mmHg   TV Peak grad:   4.0 mmHg MV Mean grad: 9.0 mmHg    TV Mean grad:   3.0 mmHg MV Vmax:      2.22 m/s    TV Vmax:        1.00 m/s MV Vmean:     141.3 cm/s  TV Vmean:       83.1 cm/s MR Peak grad: 104.4 mmHg  TV VTI:         0.26 msec MR Mean grad: 76.0 mmHg   TR Peak grad:   39.7 mmHg MR Vmax:      511.00 cm/s TR Vmax:        315.00 cm/s MR Vmean:     420.0 cm/s                           SHUNTS                           Systemic VTI: 0.17 m Glori Bickers MD Electronically  signed by Glori Bickers MD Signature Date/Time: 05/24/2022/4:00:46 PM    Final       LOS: 0 days    Flora Lipps, MD [facility] Available via Epic secure chat 7am-7pm After these hours, please refer to coverage provider listed on [email or web address] 05/25/2022, 2:40 PM

## 2022-05-26 ENCOUNTER — Ambulatory Visit: Payer: Medicare Other | Admitting: Physician Assistant

## 2022-05-26 ENCOUNTER — Other Ambulatory Visit: Payer: Self-pay | Admitting: Physician Assistant

## 2022-05-26 DIAGNOSIS — I5033 Acute on chronic diastolic (congestive) heart failure: Secondary | ICD-10-CM | POA: Diagnosis not present

## 2022-05-26 DIAGNOSIS — W19XXXA Unspecified fall, initial encounter: Secondary | ICD-10-CM

## 2022-05-26 DIAGNOSIS — R0609 Other forms of dyspnea: Secondary | ICD-10-CM | POA: Diagnosis not present

## 2022-05-26 DIAGNOSIS — G894 Chronic pain syndrome: Secondary | ICD-10-CM | POA: Diagnosis not present

## 2022-05-26 DIAGNOSIS — E7800 Pure hypercholesterolemia, unspecified: Secondary | ICD-10-CM | POA: Diagnosis not present

## 2022-05-26 DIAGNOSIS — N179 Acute kidney failure, unspecified: Secondary | ICD-10-CM

## 2022-05-26 LAB — CBC WITH DIFFERENTIAL/PLATELET
Abs Immature Granulocytes: 0.02 10*3/uL (ref 0.00–0.07)
Basophils Absolute: 0.1 10*3/uL (ref 0.0–0.1)
Basophils Relative: 1 %
Eosinophils Absolute: 0.3 10*3/uL (ref 0.0–0.5)
Eosinophils Relative: 5 %
HCT: 30.4 % — ABNORMAL LOW (ref 36.0–46.0)
Hemoglobin: 10.2 g/dL — ABNORMAL LOW (ref 12.0–15.0)
Immature Granulocytes: 0 %
Lymphocytes Relative: 34 %
Lymphs Abs: 2.4 10*3/uL (ref 0.7–4.0)
MCH: 28.5 pg (ref 26.0–34.0)
MCHC: 33.6 g/dL (ref 30.0–36.0)
MCV: 84.9 fL (ref 80.0–100.0)
Monocytes Absolute: 0.7 10*3/uL (ref 0.1–1.0)
Monocytes Relative: 10 %
Neutro Abs: 3.6 10*3/uL (ref 1.7–7.7)
Neutrophils Relative %: 50 %
Platelets: 214 10*3/uL (ref 150–400)
RBC: 3.58 MIL/uL — ABNORMAL LOW (ref 3.87–5.11)
RDW: 15.2 % (ref 11.5–15.5)
WBC: 7.2 10*3/uL (ref 4.0–10.5)
nRBC: 0.7 % — ABNORMAL HIGH (ref 0.0–0.2)

## 2022-05-26 LAB — BASIC METABOLIC PANEL
Anion gap: 7 (ref 5–15)
BUN: 10 mg/dL (ref 8–23)
CO2: 27 mmol/L (ref 22–32)
Calcium: 8.7 mg/dL — ABNORMAL LOW (ref 8.9–10.3)
Chloride: 104 mmol/L (ref 98–111)
Creatinine, Ser: 1.45 mg/dL — ABNORMAL HIGH (ref 0.44–1.00)
GFR, Estimated: 41 mL/min — ABNORMAL LOW (ref >60)
Glucose, Bld: 96 mg/dL (ref 70–99)
Potassium: 3.8 mmol/L (ref 3.5–5.1)
Sodium: 138 mmol/L (ref 135–145)

## 2022-05-26 LAB — MAGNESIUM: Magnesium: 1.7 mg/dL (ref 1.7–2.4)

## 2022-05-26 LAB — PROTIME-INR
INR: 2.8 — ABNORMAL HIGH (ref 0.8–1.2)
Prothrombin Time: 29.1 seconds — ABNORMAL HIGH (ref 11.4–15.2)

## 2022-05-26 MED ORDER — DILTIAZEM HCL ER COATED BEADS 360 MG PO CP24: 360.0000 mg | ORAL_CAPSULE | Freq: Every day | ORAL | 3 refills | Status: DC

## 2022-05-26 MED ORDER — COLCHICINE 0.6 MG PO TABS: 0.6000 mg | ORAL_TABLET | Freq: Two times a day (BID) | ORAL | 2 refills | Status: DC

## 2022-05-26 MED ORDER — BLOOD GLUCOSE MONITOR KIT: PACK | 0 refills | Status: DC

## 2022-05-26 MED ORDER — METHOCARBAMOL 500 MG PO TABS: 500.0000 mg | ORAL_TABLET | Freq: Three times a day (TID) | ORAL | 0 refills | Status: DC | PRN

## 2022-05-26 MED ORDER — FUROSEMIDE 20 MG PO TABS: 20.0000 mg | ORAL_TABLET | Freq: Every day | ORAL | 2 refills | Status: DC

## 2022-05-26 MED ORDER — FUROSEMIDE 20 MG PO TABS: 20.0000 mg | ORAL_TABLET | Freq: Every day | ORAL | Status: DC

## 2022-05-26 MED ORDER — WARFARIN SODIUM 2.5 MG PO TABS: 2.5000 mg | ORAL_TABLET | Freq: Once | ORAL | Status: DC

## 2022-05-26 NOTE — Progress Notes (Signed)
ANTICOAGULATION CONSULT NOTE - Follow up [place] for Warfarin Indication: atrial fibrillation  Allergies  Allergen Reactions   Hydrocodone-Acetaminophen Itching   Hydrocodone Itching    Tolerates oxycodone     Patient Measurements: Height: '5\' 11"'$  (180.3 cm) Weight: 89.7 kg (197 lb 12.8 oz) IBW/kg (Calculated) : 70.8  Vital Signs: Temp: 98.1 F (36.7 C) (06/21 0557) Temp Source: Oral (06/21 0557) BP: 103/67 (06/21 0557) Pulse Rate: 89 (06/21 0557)  Labs: Recent Labs    05/23/22 0917 05/23/22 1025 05/23/22 1123 05/24/22 0327 05/25/22 0252 05/26/22 0138  HGB  --    < >  --  9.6* 10.3* 10.2*  HCT  --    < >  --  29.5* 31.2* 30.4*  PLT  --    < >  --  230 250 214  LABPROT  --    < >  --  24.3* 28.3* 29.1*  INR  --    < >  --  2.2* 2.7* 2.8*  CREATININE  --   --   --  1.14* 1.05* 1.45*  TROPONINIHS 13  --  14  --   --   --    < > = values in this interval not displayed.     Estimated Creatinine Clearance: 49.8 mL/min (A) (by C-G formula based on SCr of 1.45 mg/dL (H)).   Medical History: Past Medical History:  Diagnosis Date   Acute pericardial effusion 05/17/2022   AKI (acute kidney injury) ([place]) 05/17/2022   Anxiety    Arthritis    Cervicalgia    Chondromalacia of patella    Chronic neck pain    Chronic pain syndrome    Depression    secondary to loss of her son at age 34   Diabetes mellitus    Diabetic neuropathy ([place])    DMII (diabetes mellitus, type 2) ([place])    Dysrhythmia    Enthesopathy of hip region    Fibromyalgia    GERD (gastroesophageal reflux disease)    Hep C w/o coma, chronic ([place])    Hepatitis C    diagnosed 2005   Hypertension    Insomnia    Insomnia    Low back pain    Lumbago    Mitral regurgitation    Obesity    Pain in joint, upper arm    Primary localized osteoarthrosis, lower leg    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   Substance abuse ([place])    sober since 2001   Tobacco abuse     Tricuspid regurgitation    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   Past Week   albuterol (PROVENTIL HFA;VENTOLIN HFA) 108 (90 Base) MCG/ACT inhaler Inhale 2 puffs into the lungs every 6 (six) hours as needed for wheezing or shortness of breath.    Past Week   aspirin 81 MG EC tablet Take 81 mg by mouth daily.   05/21/2022   atorvastatin (LIPITOR) 20 MG tablet Take 20 mg by mouth daily.   05/21/2022   buPROPion (WELLBUTRIN XL) 300 MG 24 hr tablet Take 300 mg by mouth in the morning.   05/21/2022   colchicine 0.6 MG tablet Take 1 tablet (0.6 mg total) by mouth daily. 60 tablet 2 05/21/2022   Cyanocobalamin (VITAMIN  B 12 PO) Take 500 mcg by mouth daily.   Past Week   diltiazem (CARDIZEM CD) 300 MG 24 hr capsule Take 1 capsule (300 mg total) by mouth daily. 30 capsule 6 05/21/2022   ferrous [id number] C-folic acid (TRINSICON / FOLTRIN) capsule Take 1 capsule by mouth 2 (two) times daily after a meal.   Past Week   FLUoxetine (PROZAC) 20 MG capsule Take 20 mg by mouth every morning.   05/21/2022   furosemide (LASIX) 40 MG tablet Take 40 mg by mouth 2 (two) times daily.   05/21/2022   gabapentin (NEURONTIN) 600 MG tablet Take 600 mg by mouth 3 (three) times daily.   05/21/2022   hydrOXYzine (ATARAX) 25 MG tablet Take 25 mg by mouth every 4 (four) hours as needed for anxiety.   05/21/2022   linagliptin (TRADJENTA) 5 MG TABS tablet Take 5 mg by mouth in the morning.   05/21/2022   methocarbamol (ROBAXIN) 500 MG tablet Take 500 mg by mouth 4 (four) times daily.   05/21/2022   metoprolol tartrate (LOPRESSOR) 50 MG tablet Take 1 tablet (50 mg total) by mouth 2 (two) times daily. 60 tablet 6 05/21/2022 at 1300   nystatin ointment (MYCOSTATIN) Apply topically 2 (two) times daily.   Past Month   omeprazole  (PRILOSEC) 20 MG capsule Take 20 mg by mouth daily.   05/21/2022   oxyCODONE (ROXICODONE) 15 MG immediate release tablet Take 15 mg by mouth every 4 (four) hours as needed for pain.   Past Week   PREMARIN 0.45 MG tablet Take 0.45 mg by mouth daily.   05/21/2022   traZODone (DESYREL) 100 MG tablet Take 2.5 tablets (250 mg total) by mouth at bedtime as needed for sleep. 225 tablet 0 Past Week   warfarin (COUMADIN) 5 MG tablet Take 1 tablet (5 mg total) by mouth daily.   05/21/2022 at [place] 1 each by Does not apply route daily. Check blood sugar once daily. 250.00 102 each 4    glucose blood (ACCU-CHEK SMARTVIEW) test strip Check blood sugar once daily. 250.00 50 each 12    naloxone (NARCAN) nasal spray 4 mg/0.1 mL Place 1 spray into the nose once.      Scheduled:   aspirin EC  81 mg Oral Daily   atorvastatin  20 mg Oral Daily   buPROPion  300 mg Oral q AM   colchicine  0.6 mg Oral BID   diltiazem  360 mg Oral Daily   estrogens (conjugated)  0.45 mg Oral Daily   ferrous [id number] C-folic acid  1 capsule Oral BID PC   FLUoxetine  20 mg Oral q morning   gabapentin  300 mg Oral TID   magnesium oxide  400 mg Oral BID   methocarbamol  500 mg Oral QID   metoprolol tartrate  50 mg Oral BID   pantoprazole  40 mg Oral Daily   Warfarin - Pharmacist Dosing Inpatient   Does not apply q1600   Infusions:  PRN: acetaminophen, albuterol, HYDROmorphone (DILAUDID) injection, hydrOXYzine, melatonin, nitroGLYCERIN, oxyCODONE, polyethylene glycol, traZODone  Assessment: 61 yoF with recent MV and TV repair 5/16 c/b POAF, started on warfarin. Pt with early readmission for pericardial effusion and tamponade requiring [facility] for warfarin reversal and pericardiocentesis 6/5. Pt now readmitted with fall 6/17. CT head negative, INR 1.7 on admit.  INR today is therapeutic at 2.8, CBC stable.  Note home warfarin dose initially '5mg'$  T/R/Sat + 2.'5mg'$  M/W/F/Sun, then subsequently  discharged on '5mg'$  daily after last admission. It appears pt has not had INR checked in the outpatient setting since initiating warfarin.  Goal of Therapy:  INR 2-3 Monitor platelets by anticoagulation protocol: Yes   Plan:  Warfarin 2.'5mg'$  PO x1 Daily protime  Arrie Senate, PharmD, BCPS, [facility] Clinical Pharmacist [phone] Please check AMION for all [facility] numbers 05/26/2022

## 2022-05-26 NOTE — Discharge Summary (Addendum)
Physician Discharge Summary   Patient: [name] MRN: [id number] DOB: 28-Oct-1961  Admit date:     05/22/2022  Discharge date: 05/26/22  Discharge Physician: [name]   PCP: [place]   Recommendations at discharge:   Follow-up PCP in 2 weeks  Discharge Diagnoses: Principal Problem:   AKI (acute kidney injury) ([place]) Active Problems:   Type 2 diabetes mellitus without complication, without long-term current use of insulin (HCC)   Chronic pain syndrome   Depression   DDD (degenerative disc disease), lumbar   History of atrial fibrillation   Hypercholesterolemia   DOE (dyspnea on exertion)   Hypomagnesemia  Resolved Problems:   * No resolved hospital problems. [facility] Course:  Acute kidney injury on diuretics due to chronic systolic CHF Baseline creatinine around 0.9.  Creatinine of 2.0 on presentation.   Received gentle IV fluids.  Creatinine today is 1.45.   Cardiology was consulted and patient started on IV Lasix.  Due to acute kidney injury IV Lasix has been started.  Cardiology recommend to start low-dose Lasix 20 mg daily from tomorrow.     Hypomagnesemia had episodes of non sustained tachycardia.  Magnesium of 1.3 initially and received magnesium sulfate IV and oral.  Magnesium of 1.5 today and has received magnesium sulfate IV again.  Repeat magnesium is 1.7   Shortness of breath, chest discomfort.  Negative troponins.  EKG with atrial flutter.  Patient had a recent 2D echocardiogram on 05/13/2022 with LV ejection fraction 45 to 50% with regional wall motion abnormality on the left ventricle.  History of pericardiocentesis, mitral valve repair.  Patient was seen by cardiology during hospitalization and recommended increasing colchicine to twice a day.  Recommendation is 3 months of colchicine.  Dose of Cardizem has been increased to 360 mg daily due to hypotension.  D-dimer was elevated so CT angiogram of the chest was performed which was negative  for PE but bilateral effusion.  Venous duplex of lower extremities was negative for DVT.  Patient to follow-up with cardiology as outpatient    Muscular strain with low-grade tear seen on noncontrast MRI pelvis Patient complains of difficulty ambulating and pain on the left leg.  MRI of the lower extremity showed extensive muscle strain with low-grade tearing involving the left obturator externus muscle without tendon disruption or intramuscular hematoma.  Muscle strain with low-grade tear involving the inferior aspect of the distal left gluteus minimus muscle.  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  Degenerative facet arthropathy of L4-5 and L5-S1 with associated reactive subchondral bone marrow edema.  PT OT has recommended home health PT on discharge. Continue muscle relaxant, oxycodone on discharge.  CK level of 153.   Mild left knee effusion, POA Continue supportive care.  Ice packs. continue PT as outpatient.   Essential hypertension Continue metoprolol, Cardizem.  Cardizem dose has been increased at this time.   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   History of large pericardial effusion status post pericardiocentesis Follows with cardiology as outpatient.  On colchicine as outpatient.  Has been increased to twice a day and will need to continue for 3 more months.  Patient will follow-up with cardiology as outpatient.   Diabetes mellitus type 2.  Latest hemoglobin A1c of 6.1. on diabetic diet.  On linagliptin as outpatient.  Chronic systolic CHF 16-10% Will resume Lasix 20 mg daily on discharge.   Non sustained ventricular tachycardia.  Received magnesium IV.  No further episodes.   Falls, debility weakness.  PT evaluation has recommended home health PT on discharge.     Assessment and Plan: No notes have been filed under this hospital service. Service: Hospitalist        Consultants: Cardiology Procedures performed:  Disposition: Home Diet recommendation:  Discharge Diet Orders (From admission, onward)     Start     Ordered   05/26/22 0000  Diet - low sodium heart healthy        05/26/22 1208           Cardiac diet DISCHARGE MEDICATION: Allergies as of 05/26/2022       Reactions   Hydrocodone-acetaminophen Itching   Hydrocodone Itching   Tolerates oxycodone         Medication List     TAKE these medications    Accu-Chek FastClix Lancets Misc 1 each by Does not apply route daily. Check blood sugar once daily. 250.00   acetaminophen 500 MG tablet Commonly known as: TYLENOL Take 1,000 mg by mouth every 6 (six) hours as needed for mild pain.   albuterol 108 (90 Base) MCG/ACT inhaler Commonly known as: VENTOLIN HFA Inhale 2 puffs into the lungs every 6 (six) hours as needed for wheezing or shortness of breath.   aspirin EC 81 MG tablet Take 81 mg by mouth daily.   atorvastatin 20 MG tablet Commonly known as: LIPITOR Take 20 mg by mouth daily.   blood glucose meter kit and supplies Kit Dispense based on patient and insurance preference. Use up to four times daily as directed.   buPROPion 300 MG 24 hr tablet Commonly known as: WELLBUTRIN XL Take 300 mg by mouth in the morning.   colchicine 0.6 MG tablet Take 1 tablet (0.6 mg total) by mouth 2 (two) times daily. What changed: when to take this   diltiazem 360 MG 24 hr capsule Commonly known as: CARDIZEM CD Take 1 capsule (360 mg total) by mouth daily. Start taking on: May 27, 2022 What changed:  medication strength how much to take   ferrous [id number] C-folic acid capsule Commonly known as: TRINSICON / FOLTRIN Take 1 capsule by mouth 2 (two) times daily after a meal.   FLUoxetine 20 MG capsule Commonly known as: PROZAC Take 20 mg by mouth  every morning.   furosemide 20 MG tablet Commonly known as: LASIX Take 1 tablet (20 mg total) by mouth daily. Start taking on: May 27, 2022 What changed:  medication strength how much to take when to take this   gabapentin 600 MG tablet Commonly known as: NEURONTIN Take 600 mg by mouth 3 (three) times daily.   glucose blood test strip Commonly known as: Accu-Chek SmartView Check blood sugar once daily. 250.00   hydrOXYzine 25 MG tablet Commonly known as: ATARAX Take 25 mg by mouth every 4 (four) hours as needed for anxiety.   linagliptin 5 MG Tabs tablet Commonly known as: TRADJENTA Take 5 mg by mouth in the morning.   methocarbamol 500 MG tablet Commonly known as: ROBAXIN Take 1 tablet (500 mg total) by mouth every 8 (eight) hours as needed for muscle spasms. What changed:  when to take this reasons to take this   metoprolol tartrate 50 MG tablet Commonly known as: LOPRESSOR Take 1 tablet (50 mg total) by mouth 2 (two) times daily.   naloxone  4 MG/0.1ML Liqd nasal spray kit Commonly known as: NARCAN Place 1 spray into the nose once.   nystatin ointment Commonly known as: MYCOSTATIN Apply topically 2 (two) times daily.   omeprazole 20 MG capsule Commonly known as: PRILOSEC Take 20 mg by mouth daily.   oxyCODONE 15 MG immediate release tablet Commonly known as: ROXICODONE Take 15 mg by mouth every 4 (four) hours as needed for pain.   Premarin 0.45 MG tablet Generic drug: estrogens (conjugated) Take 0.45 mg by mouth daily.   traZODone 100 MG tablet Commonly known as: DESYREL Take 2.5 tablets (250 mg total) by mouth at bedtime as needed for sleep.   VITAMIN B 12 PO Take 500 mcg by mouth daily.   warfarin 5 MG tablet Commonly known as: COUMADIN Take 1 tablet (5 mg total) by mouth daily.        [place] Follow up.   Contact information: [place] [address] [phone]          Care, [facility] Follow up.   Specialty: Home Health Services Why: Registered Nurse, Physical Therapy, Occupational Therapy, Aide office to call with visit times. Contact information: [place] [address] [phone]         Leanor Kail, PA Follow up on 06/10/2022.   Specialty: Cardiology Why: $RemoveBeforeD'@10'rhJaHODhTyPHYG$ :55 for hospital follow up Contact information: [address] [place] [address] [phone]         [facility] [address] [facility] Follow up on 05/28/2022.   Specialty: Cardiology Why: for renal function check between 7:30 to 4:15 Contact information: [address], [place] Follow up in 2 week(s).   Contact information: [place] [address] [phone]         Werner Lean, MD .   Specialty: Cardiology Contact information: [address] [place] [address] [phone]                Discharge Exam: Danley Danker Weights   05/24/22 0555 05/25/22 0529 05/26/22 0557  Weight: 94.8 kg 91.5 kg 89.7 kg   General-appears in no acute distress Heart-S1-S2, regular, no murmur auscultated Lungs-clear to auscultation bilaterally, no wheezing or crackles auscultated Abdomen-soft, nontender, no organomegaly Extremities-no edema in the lower extremities Neuro-alert, oriented x3, no focal deficit noted  Condition at discharge: stable  The results of significant diagnostics from this hospitalization (including imaging, microbiology, ancillary and laboratory) are listed below for reference.   Imaging Studies: VAS [place] LOWER EXTREMITY VENOUS (DVT)  Result Date: 05/25/2022  Lower Venous DVT Study Patient Name:  [name]  Date of Exam:   05/25/2022 Medical Rec #: [id number]          Accession #:    [id number] Date of Birth: [birth year]          Patient Gender: F Patient Age:   71  years Exam Location:  [facility] Procedure:      VAS [place] LOWER EXTREMITY VENOUS (DVT) Referring Phys: Corrie Mckusick POKHREL --------------------------------------------------------------------------------  Indications: Swelling.  Comparison Study: No previous exam noted. Performing Technologist: Bobetta Lime BS, RVT  Examination Guidelines: A complete evaluation includes B-mode imaging, spectral Doppler, color Doppler, and power Doppler as needed of all accessible portions of each vessel. Bilateral testing is considered an integral part of a complete examination. Limited  examinations for reoccurring indications may be performed as noted. The reflux portion of the exam is performed with the patient in reverse Trendelenburg.  +---------+---------------+---------+-----------+----------+--------------+ RIGHT    CompressibilityPhasicitySpontaneityPropertiesThrombus Aging +---------+---------------+---------+-----------+----------+--------------+ CFV      Full           Yes      Yes                                 +---------+---------------+---------+-----------+----------+--------------+ SFJ      Full                                                        +---------+---------------+---------+-----------+----------+--------------+ FV Prox  Full                                                        +---------+---------------+---------+-----------+----------+--------------+ FV Mid   Full                                                        +---------+---------------+---------+-----------+----------+--------------+ FV DistalFull                                                        +---------+---------------+---------+-----------+----------+--------------+ PFV      Full                                                        +---------+---------------+---------+-----------+----------+--------------+ POP      Full           Yes      Yes                                  +---------+---------------+---------+-----------+----------+--------------+ PTV      Full                                                        +---------+---------------+---------+-----------+----------+--------------+ PERO     Full                                                        +---------+---------------+---------+-----------+----------+--------------+   +---------+---------------+---------+-----------+----------+--------------+ LEFT     CompressibilityPhasicitySpontaneityPropertiesThrombus Aging +---------+---------------+---------+-----------+----------+--------------+ CFV      Full  Yes      Yes                                 +---------+---------------+---------+-----------+----------+--------------+ SFJ      Full                                                        +---------+---------------+---------+-----------+----------+--------------+ FV Prox  Full                                                        +---------+---------------+---------+-----------+----------+--------------+ FV Mid   Full                                                        +---------+---------------+---------+-----------+----------+--------------+ FV DistalFull                                                        +---------+---------------+---------+-----------+----------+--------------+ PFV      Full                                                        +---------+---------------+---------+-----------+----------+--------------+ POP      Full           Yes      Yes                                 +---------+---------------+---------+-----------+----------+--------------+ PTV      Full                                                        +---------+---------------+---------+-----------+----------+--------------+ PERO     Full                                                         +---------+---------------+---------+-----------+----------+--------------+     Summary: BILATERAL: - No evidence of deep vein thrombosis seen in the lower extremities, bilaterally. -No evidence of popliteal cyst, bilaterally.   *See table(s) above for measurements and observations. Electronically signed by Deitra Mayo MD on 05/25/2022 at 4:46:15 PM.    Final    CT Angio Chest Pulmonary Embolism (PE) W or WO Contrast  Result Date: 05/25/2022 CLINICAL DATA:  Pulmonary embolism suspected,  high clinical probability EXAM: CT ANGIOGRAPHY CHEST WITH CONTRAST TECHNIQUE: Multidetector CT imaging of the chest was performed using the standard protocol during bolus administration of intravenous contrast. Multiplanar CT image reconstructions and MIPs were obtained to evaluate the vascular anatomy. RADIATION DOSE REDUCTION: This exam was performed according to the departmental dose-optimization program which includes automated exposure control, adjustment of the mA and/or kV according to patient size and/or use of iterative reconstruction technique. CONTRAST:  148m OMNIPAQUE IOHEXOL 350 MG/ML SOLN COMPARISON:  Previous chest radiographs including the examination done on 05/22/2022, CT chest done on 12/18/2020 FINDINGS: Cardiovascular: There is metallic density in the region of mitral and tricuspid valves possibly suggesting previous prosthetic valve placement. Contrast density in thoracic aorta is less than adequate to evaluate the lumen. There are no intraluminal filling defects in the pulmonary artery branches. Evaluation of small peripheral branches in the lower lung fields is limited by pleural effusions and infiltrates. There is reflux of contrast into inferior vena cava and hepatic veins. Mediastinum/Nodes: No new significant lymphadenopathy seen. There is 1.6 cm low-density nodule in the left lobe of thyroid. Lungs/Pleura: Small to moderate bilateral pleural effusions are seen, more so on the right side. There  is no pneumothorax. There are faint ground-glass densities in the parahilar regions and lower lung fields suggesting possible interstitial edema. There are small patchy infiltrates in the posterior lower lung fields, more so on the right side. There are linear densities in the right parahilar region. In the image 97 of series 7, there is 8 mm pleural-based nodule in the right lower lobe which measured 4 mm in the previous study. In the image 106 there is 12 mm nodular density in the lateral aspect of right lower lung fields. In the previous study there was a 9 mm nodule in the same region. Evaluation of this nodule in the current study however is less than optimal due to adjacent infiltrate in right lower lobe. Upper Abdomen: No significant abnormality is seen. Musculoskeletal: Degenerative changes are noted in the thoracic spine, particularly severe at T2-T3 and T11-T12 levels. Similar finding was seen in the previous study. Review of the MIP images confirms the above findings. IMPRESSION: There is no evidence of pulmonary artery embolism. There are faint ground-glass densities in the parahilar regions suggesting possible pulmonary edema. There are linear and patchy infiltrates in the right parahilar region and both lower lung fields suggesting atelectasis/pneumonia. Small to moderate bilateral pleural effusions, more so on the right side. There is 8 mm pleural-based nodule in the right lower lobe which has increased in size. There is 12 mm nodular density in the right lower lobe with possible increase in size. Adjacent infiltrate in the right lower lobe limits evaluation of interval change. Short-term follow-up CT chest in the 3 months may be considered to re-evaluate the nodules in the right lower lobe. Other findings as described in the body of the report. Electronically Signed   By: PElmer PickerM.D.   On: 05/25/2022 13:35   ECHOCARDIOGRAM LIMITED  Result Date: 05/24/2022    ECHOCARDIOGRAM LIMITED  REPORT   Patient Name:   Nicole WILLISDate of Exam: 05/24/2022 Medical Rec #:  [id number]        Height:       71.0 in Accession #:    [id number]       Weight:       209.0 lb Date of Birth:  109/15/1962        BSA:  2.148 m Patient Age:    13 years          BP:           152/91 mmHg Patient Gender: F                 HR:           65 bpm. Exam Location:  Inpatient Procedure: Limited Echo, Color Doppler and Cardiac Doppler Indications:    I31.3 Pericardial effusion  History:        Patient has prior history of Echocardiogram examinations, most                 recent 05/13/2022. CHF, Arrythmias:Atrial Fibrillation; Risk                 Factors:Hypertension, Diabetes and Dyslipidemia. 04/19/22 MV and                 TV ring repairs.  Sonographer:    Raquel Sarna Senior RDCS Referring Phys: [place]  1. Left ventricular ejection fraction, by estimation, is 50 to 55%. The left ventricle has low normal function. The left ventricle demonstrates regional wall motion abnormalities (see scoring diagram/findings for description). There is mild concentric left ventricular hypertrophy. There is hypokinesis of the left ventricular, basal-mid inferior wall.  2. Right ventricular systolic function is moderately reduced. There is moderately elevated pulmonary artery systolic pressure.  3. Left atrial size was severely dilated.  4. Right atrial size was moderately dilated.  5. The mitral valve has been repaired/replaced. Mild mitral valve regurgitation. Mild to moderate mitral stenosis. The mean mitral valve gradient is 9.0 mmHg.  6. The tricuspid valve is has been repaired/replaced. Tricuspid valve regurgitation is mild.  7. The aortic valve is bicuspid. There is mild calcification of the aortic valve.  8. The inferior vena cava is dilated in size with <50% respiratory variability, suggesting right atrial pressure of 15 mmHg.  9. No pericardial effusion. FINDINGS  Left Ventricle: Left ventricular ejection  fraction, by estimation, is 50 to 55%. The left ventricle has low normal function. The left ventricle demonstrates regional wall motion abnormalities. There is mild concentric left ventricular hypertrophy. Right Ventricle: Right ventricular systolic function is moderately reduced. There is moderately elevated pulmonary artery systolic pressure. The tricuspid regurgitant velocity is 3.15 m/s, and with an assumed right atrial pressure of 15 mmHg, the estimated right ventricular systolic pressure is 07.6 mmHg. Left Atrium: Left atrial size was severely dilated. Right Atrium: Right atrial size was moderately dilated. Mitral Valve: The mitral valve has been repaired/replaced. Mild mitral valve regurgitation. 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 Mild to moderate mitral valve stenosis. MV peak gradient, 19.8 mmHg. The mean mitral valve  gradient is 9.0 mmHg. Tricuspid Valve: The tricuspid valve is has been repaired/replaced. Tricuspid valve regurgitation is mild. Aortic Valve: The aortic valve is bicuspid. There is mild calcification of the aortic valve. Pulmonic Valve: Pulmonic valve regurgitation is trivial. Venous: The inferior vena cava is dilated in size with less than 50% respiratory variability, suggesting right atrial pressure of 15 mmHg. AORTIC VALVE LVOT Vmax:   85.50 cm/s LVOT Vmean:  53.900 cm/s LVOT VTI:    0.166 m MITRAL VALVE              TRICUSPID VALVE MV Peak grad: 19.8 mmHg   TV Peak grad:   4.0 mmHg MV Mean grad: 9.0 mmHg    TV Mean grad:  3.0 mmHg MV Vmax:      2.22 m/s    TV Vmax:        1.00 m/s MV Vmean:     141.3 cm/s  TV Vmean:       83.1 cm/s MR Peak grad: 104.4 mmHg  TV VTI:         0.26 msec MR Mean grad: 76.0 mmHg   TR Peak grad:   39.7 mmHg MR Vmax:      511.00 cm/s TR Vmax:        315.00 cm/s MR Vmean:     420.0 cm/s                           SHUNTS                           Systemic VTI: 0.17 m Glori Bickers MD Electronically signed by Glori Bickers  MD Signature Date/Time: 05/24/2022/4:00:46 PM    Final    DG Chest 1 View  Result Date: 05/22/2022 CLINICAL DATA:  Evaluate for pulmonary edema. EXAM: CHEST  1 VIEW COMPARISON:  Chest radiograph dated 05/20/2022. FINDINGS: There is cardiomegaly with vascular congestion and mild edema, progressed since the prior radiograph. Superimposed pneumonia is not excluded. Clinical correlation is recommended. No focal consolidation, pleural effusion or pneumothorax. Mitral bowel repair. Median sternotomy wires. No acute osseous pathology. IMPRESSION: Cardiomegaly with vascular congestion and mild edema, progressed since the prior radiograph. Electronically Signed   By: Anner Crete M.D.   On: 05/22/2022 21:34   DG Knee 1-2 Views Left  Result Date: 05/22/2022 CLINICAL DATA:  Fall and trauma to the left knee. EXAM: LEFT KNEE - 1-2 VIEW COMPARISON:  Left lower extremity radiograph dated 05/22/2022. FINDINGS: There is no acute fracture or dislocation. The bones are well mineralized. Mild arthritic changes of the left knee. No joint effusion. The soft tissues are unremarkable. IMPRESSION: No acute fracture or dislocation. Electronically Signed   By: Anner Crete M.D.   On: 05/22/2022 20:11   CT Head Wo Contrast  Result Date: 05/22/2022 CLINICAL DATA:  Polytrauma, blunt fall on coumadin; Polytrauma, blunt EXAM: CT HEAD WITHOUT CONTRAST CT CERVICAL SPINE WITHOUT CONTRAST TECHNIQUE: Multidetector CT imaging of the head and cervical spine was performed following the standard protocol without intravenous contrast. Multiplanar CT image reconstructions of the cervical spine were also generated. RADIATION DOSE REDUCTION: This exam was performed according to the departmental dose-optimization program which includes automated exposure control, adjustment of the mA and/or kV according to patient size and/or use of iterative reconstruction technique. COMPARISON:  None Available. FINDINGS: CT HEAD FINDINGS Brain: No evidence  of large-territorial acute infarction. No parenchymal hemorrhage. No mass lesion. No extra-axial collection. No mass effect or midline shift. No hydrocephalus. Basilar cisterns are patent. Vascular: No hyperdense vessel. Skull: No acute fracture or focal lesion. Sinuses/Orbits: Paranasal sinuses and mastoid air cells are clear. The orbits are unremarkable. Other: None. CT CERVICAL SPINE FINDINGS Alignment: Straightening of normal cervical lordosis likely due to positioning and degenerative changes. Skull base and vertebrae: Multilevel degenerative changes of the spine. No associated severe osseous neural foraminal or central canal stenosis. No acute fracture. No aggressive appearing focal osseous lesion or focal pathologic process. Soft tissues and spinal canal: No prevertebral fluid or swelling. No visible canal hematoma. Upper chest: Partially visualized bilateral pleural effusions. Interlobular septal wall thickening. Other: None. IMPRESSION: 1. No acute intracranial abnormality. 2.  No acute displaced fracture or traumatic listhesis of the cervical spine. 3. Partially visualized bilateral pleural effusions. Recommend PA and lateral view of the chest for further evaluation. 4. Likely pulmonary edema. Electronically Signed   By: Iven Finn M.D.   On: 05/22/2022 19:51   CT Cervical Spine Wo Contrast  Result Date: 05/22/2022 CLINICAL DATA:  Polytrauma, blunt fall on coumadin; Polytrauma, blunt EXAM: CT HEAD WITHOUT CONTRAST CT CERVICAL SPINE WITHOUT CONTRAST TECHNIQUE: Multidetector CT imaging of the head and cervical spine was performed following the standard protocol without intravenous contrast. Multiplanar CT image reconstructions of the cervical spine were also generated. RADIATION DOSE REDUCTION: This exam was performed according to the departmental dose-optimization program which includes automated exposure control, adjustment of the mA and/or kV according to patient size and/or use of iterative  reconstruction technique. COMPARISON:  None Available. FINDINGS: CT HEAD FINDINGS Brain: No evidence of large-territorial acute infarction. No parenchymal hemorrhage. No mass lesion. No extra-axial collection. No mass effect or midline shift. No hydrocephalus. Basilar cisterns are patent. Vascular: No hyperdense vessel. Skull: No acute fracture or focal lesion. Sinuses/Orbits: Paranasal sinuses and mastoid air cells are clear. The orbits are unremarkable. Other: None. CT CERVICAL SPINE FINDINGS Alignment: Straightening of normal cervical lordosis likely due to positioning and degenerative changes. Skull base and vertebrae: Multilevel degenerative changes of the spine. No associated severe osseous neural foraminal or central canal stenosis. No acute fracture. No aggressive appearing focal osseous lesion or focal pathologic process. Soft tissues and spinal canal: No prevertebral fluid or swelling. No visible canal hematoma. Upper chest: Partially visualized bilateral pleural effusions. Interlobular septal wall thickening. Other: None. IMPRESSION: 1. No acute intracranial abnormality. 2. No acute displaced fracture or traumatic listhesis of the cervical spine. 3. Partially visualized bilateral pleural effusions. Recommend PA and lateral view of the chest for further evaluation. 4. Likely pulmonary edema. Electronically Signed   By: Iven Finn M.D.   On: 05/22/2022 19:51   MR PELVIS WO CONTRAST  Result Date: 05/22/2022 CLINICAL DATA:  Hip trauma, fracture suspected, xray done EXAM: MRI PELVIS WITHOUT CONTRAST TECHNIQUE: Multiplanar multisequence MR imaging of the pelvis was performed. No intravenous contrast was administered. COMPARISON:  Same-day x-ray and CT FINDINGS: Bones/Joint/Cartilage No acute fracture. No dislocation. No femoral head avascular necrosis. 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 No hip joint effusion. Bony pelvis intact without diastasis. SI joints and pubic symphysis within  normal limits. Degenerative facet arthropathy of L4-5 and L5-S1 with associated reactive subchondral bone marrow edema. Elsewhere, no bone marrow edema. No marrow replacing bone lesion. Ligaments Intact. Muscles and Tendons Strain with low-grade muscle tear involving the inferior aspect of the distal left gluteus minimus muscle (series 9, image 29). Distal tendon intact. Extensive muscle strain with low-grade tearing involving the left obturator externus muscle (series 9, image 38) without tendon disruption or intramuscular hematoma. No additional acute musculotendinous findings. Soft tissues Mild nonspecific subcutaneous edema. No organized fluid collection or hematoma. No inguinal lymphadenopathy. Small volume free fluid within the pelvis. IMPRESSION: 1. No acute osseous abnormality of the pelvis. Specifically, no fracture of the left hip. 2. Extensive muscle strain with low-grade tearing involving the left obturator externus muscle without tendon disruption or intramuscular hematoma. 3. Muscle strain with low-grade tear involving the inferior aspect of the distal left gluteus minimus muscle. 4. 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 5. Degenerative facet arthropathy of L4-5 and L5-S1 with associated reactive subchondral bone marrow edema. Electronically Signed   By: Davina Poke D.O.  On: 05/22/2022 19:39   DG Knee 2 Views Left  Result Date: 05/22/2022 CLINICAL DATA:  Fall.  Knee pain and swelling. EXAM: LEFT KNEE - 2 VIEW COMPARISON:  Left knee radiographs 08/24/2011 FINDINGS: Progressive degenerative changes are present within the medial compartment left knee. No acute fracture dislocation is present. No significant effusion is present. Soft tissues are unremarkable. IMPRESSION: 1. 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 2. No acute abnormality. Electronically Signed   By: San Morelle M.D.   On: 05/22/2022 16:22   DG Femur 1 View  Left  Result Date: 05/22/2022 CLINICAL DATA:  Fall.  Knee pain and swelling. EXAM: LEFT FEMUR 1 VIEW COMPARISON:  None Available. FINDINGS: AP images demonstrate no acute fracture. Degenerative changes are present in the knee. IMPRESSION: 1. No acute abnormality. 2. Degenerative changes of the knee. Electronically Signed   By: San Morelle M.D.   On: 05/22/2022 16:20   CT Hip Left Wo Contrast  Result Date: 05/22/2022 CLINICAL DATA:  Hip trauma, fracture suspected, xray done EXAM: CT OF THE LEFT HIP WITHOUT CONTRAST TECHNIQUE: Multidetector CT imaging of the left hip was performed according to the standard protocol. Multiplanar CT image reconstructions were also generated. RADIATION DOSE REDUCTION: This exam was performed according to the departmental dose-optimization program which includes automated exposure control, adjustment of the mA and/or kV according to patient size and/or use of iterative reconstruction technique. COMPARISON:  X-ray 05/22/2022 FINDINGS: Bones/Joint/Cartilage No acute fracture. No dislocation. No evidence of femoral head avascular necrosis. Mild left hip osteoarthritis with joint space narrowing and small marginal osteophyte formation. No appreciable hip joint effusion. Included portion of the left hemipelvis is intact without evidence of fracture or diastasis. No suspicious lytic or sclerotic bone lesion. Ligaments Suboptimally assessed by CT. Muscles and Tendons No acute musculotendinous abnormality by CT. Soft tissues No fluid collection or hematoma.  No left inguinal lymphadenopathy. IMPRESSION: No acute fracture or dislocation of the left hip. Electronically Signed   By: Davina Poke D.O.   On: 05/22/2022 15:22   DG Hip Unilat W or Wo Pelvis 2-3 Views Left  Result Date: 05/22/2022 CLINICAL DATA:  Acute LEFT hip pain following fall. Initial encounter. EXAM: DG HIP (WITH OR WITHOUT PELVIS) 2-3V LEFT COMPARISON:  12/18/2020 CT FINDINGS: There is no evidence of hip  fracture or dislocation. There is no evidence of arthropathy or other focal bone abnormality. IMPRESSION: Negative. Electronically Signed   By: Margarette Canada M.D.   On: 05/22/2022 13:17   DG Chest 2 View  Result Date: 05/20/2022 CLINICAL DATA:  Status post mitral valve surgery EXAM: CHEST - 2 VIEW COMPARISON:  Chest x-ray dated May 15, 2022 FINDINGS: Cardiac and mediastinal contours are within normal limits. Status post median sternotomy and valve replacement. Linear opacity of of the left upper lobe, likely due to atelectasis. Small right-greater-than-left pleural effusions. No focal consolidation. No evidence of pneumothorax. IMPRESSION: Small right-greater-than-left pleural effusions. Electronically Signed   By: Yetta Glassman M.D.   On: 05/20/2022 14:22   DG CHEST PORT 1 VIEW  Result Date: 05/15/2022 CLINICAL DATA:  Chest pain. EXAM: PORTABLE CHEST 1 VIEW COMPARISON:  05/10/2022 and older studies.  CT, 12/18/2020. FINDINGS: Previous cardiac surgery and valve replacement, stable from the most recent prior exam, new compared to the 12/18/2020 CT. Cardiac silhouette normal in size. No mediastinal or hilar masses. Hazy opacity in the right lung base, new since prior chest radiograph. There are linear opacities in the right mid  lung, also new. Mild linear opacity and peripheral pleural based opacity at the left lung base, also new. Remainder of the lungs is clear. Suspect small effusions, greater on the right. No pneumothorax. Skeletal structures are grossly intact. IMPRESSION: 1. New lung base opacities as detailed, greater on the right. Suspect small effusions and associated atelectasis. Consider pneumonia in the proper clinical setting. No evidence of pulmonary edema. Electronically Signed   By: Lajean Manes M.D.   On: 05/15/2022 12:30   ECHOCARDIOGRAM LIMITED  Result Date: 05/13/2022    ECHOCARDIOGRAM LIMITED REPORT   Patient Name:   [name] Date of Exam: 05/13/2022 Medical Rec #:  [id number]          Height:       71.0 in Accession #:    [id number]        Weight:       217.6 lb Date of Birth:  1961/01/25         BSA:          2.185 m Patient Age:    69 years          BP:           147/128 mmHg Patient Gender: F                 HR:           98 bpm. Exam Location:  Inpatient Procedure: Limited Echo, Color Doppler and Limited Color Doppler Indications:    Pericardial effusion  History:        Patient has prior history of Echocardiogram examinations, most                 recent 04/23/2022. Signs/Symptoms:Murmur; Risk Factors:Diabetes                 and Hypertension. 05/10/22 pericardiocentesis                 MV and TV repair 04/19/22.                  Mitral Valve: 30 mm prosthetic annuloplasty ring valve is                 present in the mitral position. Procedure Date: 04/27/22.  Sonographer:    Luisa Hart RDCS Referring Phys: [place]  1. Moderate septal hypertrophy with otherwise mild LVH. Basal to mid inferior and inferoseptal hypokinesis. Left ventricular ejection fraction, by estimation, is 45 to 50%. The left ventricle has mildly decreased function. The left ventricle demonstrates regional wall motion abnormalities (see scoring diagram/findings for description). There is moderate left ventricular hypertrophy.  2. Right ventricular systolic function is normal. The right ventricular size is normal. There is mildly elevated pulmonary artery systolic pressure.  3. Left atrial size was moderately dilated.  4. The mitral valve has been repaired/replaced. Trivial mitral valve regurgitation. No evidence of mitral stenosis. There is a 30 mm prosthetic annuloplasty ring present in the mitral position. Procedure Date: 04/27/22.  5. Pericardial effusion has resolved.  6. The tricuspid valve is has been repaired/replaced. 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 Tricuspid valve regurgitation is mild.  7. The aortic valve is normal in structure. Aortic valve  regurgitation is not visualized. No aortic stenosis is present.  8. The inferior vena cava is normal in size with greater than 50% respiratory variability, suggesting right atrial pressure of 3 mmHg. FINDINGS  Left Ventricle: Moderate septal hypertrophy with  otherwise mild LVH. Basal to mid inferior and inferoseptal hypokinesis. Left ventricular ejection fraction, by estimation, is 45 to 50%. The left ventricle has mildly decreased function. The left ventricle demonstrates regional wall motion abnormalities. The left ventricular internal cavity size was normal in size. There is moderate left ventricular hypertrophy. Right Ventricle: The right ventricular size is normal. No increase in right ventricular wall thickness. Right ventricular systolic function is normal. There is mildly elevated pulmonary artery systolic pressure. The tricuspid regurgitant velocity is 2.73  m/s, and with an assumed right atrial pressure of 15 mmHg, the estimated right ventricular systolic pressure is 66.5 mmHg. Left Atrium: Left atrial size was moderately dilated. Right Atrium: Right atrial size was normal in size. Pericardium: Pericardial effusion has resolved. There is no evidence of pericardial effusion. There is excessive respiratory variation in the tricuspid valve spectral Doppler velocities. Mitral Valve: The mitral valve has been repaired/replaced. Trivial mitral valve regurgitation. There is a 30 mm prosthetic annuloplasty ring present in the mitral position. Procedure Date: 04/27/22. No evidence of mitral valve stenosis. The mean mitral valve gradient is 6.4 mmHg with average heart rate of 76 bpm. Tricuspid Valve: The tricuspid valve is has been repaired/replaced. Tricuspid valve regurgitation is mild . No evidence of tricuspid stenosis. 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 Aortic Valve: The aortic valve is normal in structure. Aortic valve regurgitation is not visualized. No aortic stenosis is  present. Aortic valve mean gradient measures 2.0 mmHg. Aortic valve peak gradient measures 3.9 mmHg. Pulmonic Valve: The pulmonic valve was normal in structure. Pulmonic valve regurgitation is not visualized. No evidence of pulmonic stenosis. Aorta: The aortic root is normal in size and structure. Venous: The inferior vena cava is normal in size with greater than 50% respiratory variability, suggesting right atrial pressure of 3 mmHg. IAS/Shunts: No atrial level shunt detected by color flow Doppler. LEFT VENTRICLE PLAX 2D LVIDd:         4.70 cm LVIDs:         3.70 cm LV PW:         1.10 cm LV IVS:        1.50 cm  LEFT ATRIUM           Index LA Vol (A4C): 75.2 ml 34.41 ml/m  AORTIC VALVE                   PULMONIC VALVE AV Vmax:           98.65 cm/s  PV Vmax:       0.72 m/s AV Vmean:          66.650 cm/s PV Vmean:      46.450 cm/s AV VTI:            0.150 m     PV VTI:        0.118 m AV Peak Grad:      3.9 mmHg    PV Peak grad:  2.1 mmHg AV Mean Grad:      2.0 mmHg    PV Mean grad:  1.0 mmHg LVOT Vmax:         65.80 cm/s LVOT Vmean:        40.100 cm/s LVOT VTI:          0.107 m LVOT/AV VTI ratio: 0.71  AORTA Ao Asc diam: 3.50 cm MITRAL VALVE                TRICUSPID VALVE MV  Mean grad: 6.4 mmHg      TR Peak grad:   29.8 mmHg MR Peak grad: 77.8 mmHg     TR Vmax:        273.00 cm/s MR Mean grad: 57.0 mmHg MR Vmax:      441.00 cm/s   SHUNTS MR Vmean:     368.0 cm/s    Systemic VTI: 0.11 m MV E velocity: 190.40 cm/s Skeet Latch MD Electronically signed by Skeet Latch MD Signature Date/Time: 05/13/2022/3:03:20 PM    Final    ECHOCARDIOGRAM LIMITED  Result Date: 05/13/2022    ECHOCARDIOGRAM LIMITED REPORT   Patient Name:   [name] Date of Exam: 05/10/2022 Medical Rec #:  [id number]         Height:       71.0 in Accession #:    [id number]        Weight:       212.0 lb Date of Birth:  October 28, 1961         BSA:          2.161 m Patient Age:    57 years          BP:           134/70 mmHg Patient Gender: F                  HR:           86 bpm. Exam Location:  Inpatient Procedure: Limited Echo                      STAT ECHO Reported to: Dr Berniece Salines on 05/10/2022 8:27:00 AM.                                 MODIFIED REPORT: This report was modified by Berniece Salines DO on 05/13/2022 due to adjust report for                                   MV accuracy.  Indications:     Pericardiocentesis  History:         Patient has prior history of Echocardiogram examinations, most                  recent 04/19/2022. Arrythmias:Atrial Fibrillation; Risk                  Factors:Former Smoker.  Sonographer:     Clayton Lefort RDCS (AE) Referring Phys:  [place] Diagnosing Phys: Godfrey Pick Tobb DO IMPRESSIONS  1. Right ventricle does not relax completely suggesting increase in intrapericardial pressure.Subtle appreciation for ventricular interdependence. Full study for tamponade physiology not performed: Tissue doppler, MV/TV respiratory variation and IVC. Moderate pericardial effusion. The pericardial effusion is circumferential.  2. Left ventricular ejection fraction, by estimation, is 55 to 60%. The left ventricle has normal function. Left ventricular diastolic function could not be evaluated.  3. Right ventricular systolic function is normal.  4. The mitral valve has been repaired/replaced.  5. The aortic valve was not well visualized. FINDINGS  Left Ventricle: Left ventricular ejection fraction, by estimation, is 55 to 60%. The left ventricle has normal function. Left ventricular diastolic function could not be evaluated. Right Ventricle: Right ventricular systolic function is normal. Left Atrium: Left atrial size was not assessed. Right Atrium: Right atrial size was  not assessed. Pericardium: Right ventricle does not relax completely suggesting increase in intrapericardial pressure.Subtle appreciation for ventricular interdependence. Full study for tamponade physiology not performed: Tissue doppler, MV/TV respiratory variation and IVC.  A moderately sized pericardial effusion is present. The pericardial effusion is circumferential. Mitral Valve: The mitral valve has been repaired/replaced. Tricuspid Valve: The tricuspid valve is grossly normal. Aortic Valve: The aortic valve was not well visualized. Pulmonic Valve: The pulmonic valve was not well visualized. Aorta: The ascending aorta was not well visualized and the aortic root was not well visualized. Venous: The inferior vena cava was not well visualized. Berniece Salines DO Electronically signed by Berniece Salines DO Signature Date/Time: 05/10/2022/10:43:00 AM    Final (Updated)    CARDIAC CATHETERIZATION  Result Date: 05/10/2022   Successful pericardiocentesis with removal of 450 cc of serosanguineous fluid. Fluid was not overtly bloody.  I suspect the fluid was more related to postsurgical pericarditis.  Cannot start NSAIDs at this point given her acute renal failure.  Hopefully, her renal function will improve now that her hemodynamics are better.  As her creatinine improves, consider increasing treatment for pericarditis.  We will try to dose colchicine for her renal function.  We will leave the drain in overnight.  Effusion was essentially gone by echocardiogram postprocedure, after the fluid removal noted above.  Plan for limited echo tomorrow.  Remove drain as determined by fluid accumulation. Discussed with Dr. Roxan Hockey. Results conveyed to bother, Jeneen Rinks, who is listed as the emergency contact.  He lives out of town but was going to inform the sister who lives here locally.   DG Chest Port 1 View  Result Date: 05/10/2022 CLINICAL DATA:  61 year old female with chest pain. EXAM: PORTABLE CHEST 1 VIEW COMPARISON:  Portable chest 05/02/2022 and earlier. FINDINGS: Portable AP semi upright view at 0610 hours. Stable cardiomegaly and mediastinal contours. Chronic prosthetic cardiac valves. Prior sternotomy. Visualized tracheal air column is within normal limits. Allowing for portable technique  the lungs are clear. No pneumothorax. No acute osseous abnormality identified. IMPRESSION: No acute cardiopulmonary abnormality. Electronically Signed   By: Genevie Ann M.D.   On: 05/10/2022 06:27   DG Chest Port 1 View  Result Date: 05/02/2022 CLINICAL DATA:  Upper chest pain, shortness of breath EXAM: PORTABLE CHEST 1 VIEW COMPARISON:  04/22/2022 FINDINGS: Single frontal view of the chest demonstrates stable enlargement the cardiac silhouette. Postsurgical changes are seen from median sternotomy and mitral and tricuspid valve replacements. No acute airspace disease, effusion, or pneumothorax. No acute bony abnormalities. IMPRESSION: 1. No acute intrathoracic process. Electronically Signed   By: Randa Ngo M.D.   On: 05/02/2022 14:59    Microbiology: Results for orders placed or performed during the hospital encounter of 05/10/22  Culture, body fluid w Gram Stain-bottle     Status: None   Collection Time: 05/10/22  7:47 AM   Specimen: Pericardial  Result Value Ref Range Status   Specimen Description PERICARDIAL  Final   Special Requests NONE  Final   Culture   Final    NO GROWTH 5 DAYS Performed at [facility], [address] [place], [place] [address]    Report Status 05/15/2022 FINAL  Final  Gram stain     Status: None   Collection Time: 05/10/22  7:47 AM   Specimen: Pericardial  Result Value Ref Range Status   Specimen Description PERICARDIAL  Final   Special Requests NONE  Final   Gram Stain   Final  ABUNDANT WBC PRESENT, PREDOMINANTLY MONONUCLEAR NO ORGANISMS SEEN Performed at [facility] [address] [place], [place] [address]    Report Status 05/10/2022 FINAL  Final  MRSA Next Gen by PCR, Nasal     Status: None   Collection Time: 05/10/22  8:34 AM   Specimen: Nasal Mucosa; Nasal Swab  Result Value Ref Range Status   MRSA by PCR Next Gen NOT DETECTED NOT DETECTED Final    Comment: (NOTE) The GeneXpert MRSA Assay (FDA approved for NASAL specimens  only), is one component of a comprehensive MRSA colonization surveillance program. It is not intended to diagnose MRSA infection nor to guide or monitor treatment for MRSA infections. Test performance is not FDA approved in patients less than 34 years old. Performed at [facility], [place] [address], [place], [place] [address]     Labs: CBC: Recent Labs  Lab 05/22/22 1531 05/23/22 1025 05/24/22 0327 05/25/22 0252 05/26/22 0138  WBC 6.3 7.2 7.2 6.8 7.2  NEUTROABS 4.3 5.2 4.5 3.6 3.6  HGB 10.1* 10.4* 9.6* 10.3* 10.2*  HCT 32.5* 32.4* 29.5* 31.2* 30.4*  MCV 90.3 87.6 86.5 85.0 84.9  PLT 202 215 230 250 861   Basic Metabolic Panel: Recent Labs  Lab 05/22/22 1531 05/24/22 0327 05/25/22 0252 05/26/22 0138  NA 138 138 138 138  K 4.1 4.1 4.0 3.8  CL 107 110 104 104  CO2 22 20* 23 27  GLUCOSE 127* 77 84 96  BUN 19 12 9 10   CREATININE 2.07* 1.14* 1.05* 1.45*  CALCIUM 7.5* 7.8* 8.6* 8.7*  MG  --  1.3* 1.5* 1.7   Liver Function Tests: Recent Labs  Lab 05/22/22 1531 05/25/22 0252  AST 19 15  ALT 11 10  ALKPHOS 119 99  BILITOT 0.5 0.7  PROT 6.3* 6.4*  ALBUMIN 3.5 3.2*   CBG: No results for input(s): "GLUCAP" in the last 168 hours.  Discharge time spent: greater than 30 minutes.  Signed: [name], MD [facility] 05/26/2022

## 2022-05-26 NOTE — Progress Notes (Signed)
Progress Note  Patient Name: [name] Date of Encounter: 05/26/2022  [facility] Cardiologist: Werner Lean, MD   Subjective   Converted to sinus yesterday. Breathing improved and back to normal.   Inpatient Medications    Scheduled Meds:  aspirin EC  81 mg Oral Daily   atorvastatin  20 mg Oral Daily   buPROPion  300 mg Oral q AM   colchicine  0.6 mg Oral BID   diltiazem  360 mg Oral Daily   estrogens (conjugated)  0.45 mg Oral Daily   ferrous [id number] C-folic acid  1 capsule Oral BID PC   FLUoxetine  20 mg Oral q morning   gabapentin  300 mg Oral TID   magnesium oxide  400 mg Oral BID   methocarbamol  500 mg Oral QID   metoprolol tartrate  50 mg Oral BID   pantoprazole  40 mg Oral Daily   warfarin  2.5 mg Oral ONCE-1600   Warfarin - Pharmacist Dosing Inpatient   Does not apply q1600   Continuous Infusions:  PRN Meds: acetaminophen, albuterol, HYDROmorphone (DILAUDID) injection, hydrOXYzine, melatonin, nitroGLYCERIN, oxyCODONE, polyethylene glycol, traZODone   Vital Signs    Vitals:   05/26/22 0557 05/26/22 0735 05/26/22 0751 05/26/22 0835  BP: 103/67 101/64 96/70 106/70  Pulse: 89 96 92 95  Resp: '15 20 19 16  '$ Temp: 98.1 F (36.7 C) 98.5 F (36.9 C) 98.5 F (36.9 C)   TempSrc: Oral Oral Oral   SpO2: 95% 97% 96%   Weight: 89.7 kg     Height:        Intake/Output Summary (Last 24 hours) at 05/26/2022 0909 Last data filed at 05/26/2022 0700 Gross per 24 hour  Intake 600 ml  Output 2625 ml  Net -2025 ml      05/26/2022    5:57 AM 05/25/2022    5:29 AM 05/24/2022    5:55 AM  Last 3 Weights  Weight (lbs) 197 lb 12.8 oz 201 lb 11.2 oz 209 lb  Weight (kg) 89.721 kg 91.491 kg 94.802 kg      Telemetry    NSR - Personally Reviewed  ECG    N/A  Physical Exam   GEN: No acute distress.   Neck: No JVD Cardiac: RRR, no murmurs, rubs, or gallops.  Respiratory: Clear to auscultation bilaterally. GI: Soft, nontender,  non-distended  MS: No edema; No deformity. Neuro:  Nonfocal  Psych: Normal affect   Labs    High Sensitivity Troponin:   Recent Labs  Lab 05/10/22 0603 05/22/22 1727 05/22/22 2200 05/23/22 0917 05/23/22 1123  TROPONINIHS 33* '11 12 13 14     '$ Chemistry Recent Labs  Lab 05/22/22 1531 05/24/22 0327 05/25/22 0252 05/26/22 0138  NA 138 138 138 138  K 4.1 4.1 4.0 3.8  CL 107 110 104 104  CO2 22 20* 23 27  GLUCOSE 127* 77 84 96  BUN '19 12 9 10  '$ CREATININE 2.07* 1.14* 1.05* 1.45*  CALCIUM 7.5* 7.8* 8.6* 8.7*  MG  --  1.3* 1.5* 1.7  PROT 6.3*  --  6.4*  --   ALBUMIN 3.5  --  3.2*  --   AST 19  --  15  --   ALT 11  --  10  --   ALKPHOS 119  --  99  --   BILITOT 0.5  --  0.7  --   GFRNONAA 27* 54* >60 41*  ANIONGAP '9 8 11 7    '$ Lipids  No results for input(s): "CHOL", "TRIG", "HDL", "LABVLDL", "LDLCALC", "CHOLHDL" in the last 168 hours.  Hematology Recent Labs  Lab 05/24/22 0327 05/25/22 0252 05/26/22 0138  WBC 7.2 6.8 7.2  RBC 3.41* 3.67* 3.58*  HGB 9.6* 10.3* 10.2*  HCT 29.5* 31.2* 30.4*  MCV 86.5 85.0 84.9  MCH 28.2 28.1 28.5  MCHC 32.5 33.0 33.6  RDW 15.1 15.1 15.2  PLT 230 250 214   Thyroid No results for input(s): "TSH", "FREET4" in the last 168 hours.  BNP Recent Labs  Lab 05/24/22 0327  BNP 792.0*    DDimer  Recent Labs  Lab 05/25/22 0803  DDIMER 4.41*     Radiology    VAS [place] LOWER EXTREMITY VENOUS (DVT)  Result Date: 05/25/2022  Lower Venous DVT Study Patient Name:  [name]  Date of Exam:   05/25/2022 Medical Rec #: [id number]          Accession #:    [id number] Date of Birth: October 08, 1961          Patient Gender: F Patient Age:   61 years Exam Location:  [facility] Procedure:      VAS [place] LOWER EXTREMITY VENOUS (DVT) Referring Phys: Corrie Mckusick POKHREL --------------------------------------------------------------------------------  Indications: Swelling.  Comparison Study: No previous exam noted. Performing Technologist: Bobetta Lime  BS, RVT  Examination Guidelines: A complete evaluation includes B-mode imaging, spectral Doppler, color Doppler, and power Doppler as needed of all accessible portions of each vessel. Bilateral testing is considered an integral part of a complete examination. Limited examinations for reoccurring indications may be performed as noted. The reflux portion of the exam is performed with the patient in reverse Trendelenburg.  +---------+---------------+---------+-----------+----------+--------------+ RIGHT    CompressibilityPhasicitySpontaneityPropertiesThrombus Aging +---------+---------------+---------+-----------+----------+--------------+ CFV      Full           Yes      Yes                                 +---------+---------------+---------+-----------+----------+--------------+ SFJ      Full                                                        +---------+---------------+---------+-----------+----------+--------------+ FV Prox  Full                                                        +---------+---------------+---------+-----------+----------+--------------+ FV Mid   Full                                                        +---------+---------------+---------+-----------+----------+--------------+ FV DistalFull                                                        +---------+---------------+---------+-----------+----------+--------------+ PFV  Full                                                        +---------+---------------+---------+-----------+----------+--------------+ POP      Full           Yes      Yes                                 +---------+---------------+---------+-----------+----------+--------------+ PTV      Full                                                        +---------+---------------+---------+-----------+----------+--------------+ PERO     Full                                                         +---------+---------------+---------+-----------+----------+--------------+   +---------+---------------+---------+-----------+----------+--------------+ LEFT     CompressibilityPhasicitySpontaneityPropertiesThrombus Aging +---------+---------------+---------+-----------+----------+--------------+ CFV      Full           Yes      Yes                                 +---------+---------------+---------+-----------+----------+--------------+ SFJ      Full                                                        +---------+---------------+---------+-----------+----------+--------------+ FV Prox  Full                                                        +---------+---------------+---------+-----------+----------+--------------+ FV Mid   Full                                                        +---------+---------------+---------+-----------+----------+--------------+ FV DistalFull                                                        +---------+---------------+---------+-----------+----------+--------------+ PFV      Full                                                        +---------+---------------+---------+-----------+----------+--------------+  POP      Full           Yes      Yes                                 +---------+---------------+---------+-----------+----------+--------------+ PTV      Full                                                        +---------+---------------+---------+-----------+----------+--------------+ PERO     Full                                                        +---------+---------------+---------+-----------+----------+--------------+     Summary: BILATERAL: - No evidence of deep vein thrombosis seen in the lower extremities, bilaterally. -No evidence of popliteal cyst, bilaterally.   *See table(s) above for measurements and observations. Electronically signed by Deitra Mayo MD on 05/25/2022 at  4:46:15 PM.    Final    CT Angio Chest Pulmonary Embolism (PE) W or WO Contrast  Result Date: 05/25/2022 CLINICAL DATA:  Pulmonary embolism suspected, high clinical probability EXAM: CT ANGIOGRAPHY CHEST WITH CONTRAST TECHNIQUE: Multidetector CT imaging of the chest was performed using the standard protocol during bolus administration of intravenous contrast. Multiplanar CT image reconstructions and MIPs were obtained to evaluate the vascular anatomy. RADIATION DOSE REDUCTION: This exam was performed according to the departmental dose-optimization program which includes automated exposure control, adjustment of the mA and/or kV according to patient size and/or use of iterative reconstruction technique. CONTRAST:  139m OMNIPAQUE IOHEXOL 350 MG/ML SOLN COMPARISON:  Previous chest radiographs including the examination done on 05/22/2022, CT chest done on 12/18/2020 FINDINGS: Cardiovascular: There is metallic density in the region of mitral and tricuspid valves possibly suggesting previous prosthetic valve placement. Contrast density in thoracic aorta is less than adequate to evaluate the lumen. There are no intraluminal filling defects in the pulmonary artery branches. Evaluation of small peripheral branches in the lower lung fields is limited by pleural effusions and infiltrates. There is reflux of contrast into inferior vena cava and hepatic veins. Mediastinum/Nodes: No new significant lymphadenopathy seen. There is 1.6 cm low-density nodule in the left lobe of thyroid. Lungs/Pleura: Small to moderate bilateral pleural effusions are seen, more so on the right side. There is no pneumothorax. There are faint ground-glass densities in the parahilar regions and lower lung fields suggesting possible interstitial edema. There are small patchy infiltrates in the posterior lower lung fields, more so on the right side. There are linear densities in the right parahilar region. In the image 97 of series 7, there is 8 mm  pleural-based nodule in the right lower lobe which measured 4 mm in the previous study. In the image 106 there is 12 mm nodular density in the lateral aspect of right lower lung fields. In the previous study there was a 9 mm nodule in the same region. Evaluation of this nodule in the current study however is less than optimal due to adjacent infiltrate in right lower lobe. Upper Abdomen: No significant abnormality is seen. Musculoskeletal: Degenerative changes are noted  in the thoracic spine, particularly severe at T2-T3 and T11-T12 levels. Similar finding was seen in the previous study. Review of the MIP images confirms the above findings. IMPRESSION: There is no evidence of pulmonary artery embolism. There are faint ground-glass densities in the parahilar regions suggesting possible pulmonary edema. There are linear and patchy infiltrates in the right parahilar region and both lower lung fields suggesting atelectasis/pneumonia. Small to moderate bilateral pleural effusions, more so on the right side. There is 8 mm pleural-based nodule in the right lower lobe which has increased in size. There is 12 mm nodular density in the right lower lobe with possible increase in size. Adjacent infiltrate in the right lower lobe limits evaluation of interval change. Short-term follow-up CT chest in the 3 months may be considered to re-evaluate the nodules in the right lower lobe. Other findings as described in the body of the report. Electronically Signed   By: Elmer Picker M.D.   On: 05/25/2022 13:35   ECHOCARDIOGRAM LIMITED  Result Date: 05/24/2022    ECHOCARDIOGRAM LIMITED REPORT   Patient Name:   [name] Date of Exam: 05/24/2022 Medical Rec #:  [id number]         Height:       71.0 in Accession #:    [id number]        Weight:       209.0 lb Date of Birth:  March 26, 1961         BSA:          2.148 m Patient Age:    61 years          BP:           152/91 mmHg Patient Gender: F                 HR:           65  bpm. Exam Location:  Inpatient Procedure: Limited Echo, Color Doppler and Cardiac Doppler Indications:    I31.3 Pericardial effusion  History:        Patient has prior history of Echocardiogram examinations, most                 recent 05/13/2022. CHF, Arrythmias:Atrial Fibrillation; Risk                 Factors:Hypertension, Diabetes and Dyslipidemia. 04/19/22 MV and                 TV ring repairs.  Sonographer:    Raquel Sarna Senior RDCS Referring Phys: [place]  1. Left ventricular ejection fraction, by estimation, is 50 to 55%. The left ventricle has low normal function. The left ventricle demonstrates regional wall motion abnormalities (see scoring diagram/findings for description). There is mild concentric left ventricular hypertrophy. There is hypokinesis of the left ventricular, basal-mid inferior wall.  2. Right ventricular systolic function is moderately reduced. There is moderately elevated pulmonary artery systolic pressure.  3. Left atrial size was severely dilated.  4. Right atrial size was moderately dilated.  5. The mitral valve has been repaired/replaced. Mild mitral valve regurgitation. Mild to moderate mitral stenosis. The mean mitral valve gradient is 9.0 mmHg.  6. The tricuspid valve is has been repaired/replaced. Tricuspid valve regurgitation is mild.  7. The aortic valve is bicuspid. There is mild calcification of the aortic valve.  8. The inferior vena cava is dilated in size with <50% respiratory variability, suggesting right atrial pressure of 15 mmHg.  9. No  pericardial effusion. FINDINGS  Left Ventricle: Left ventricular ejection fraction, by estimation, is 50 to 55%. The left ventricle has low normal function. The left ventricle demonstrates regional wall motion abnormalities. There is mild concentric left ventricular hypertrophy. Right Ventricle: Right ventricular systolic function is moderately reduced. There is moderately elevated pulmonary artery systolic pressure.  The tricuspid regurgitant velocity is 3.15 m/s, and with an assumed right atrial pressure of 15 mmHg, the estimated right ventricular systolic pressure is 53.9 mmHg. Left Atrium: Left atrial size was severely dilated. Right Atrium: Right atrial size was moderately dilated. Mitral Valve: The mitral valve has been repaired/replaced. Mild mitral valve regurgitation. 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 Mild to moderate mitral valve stenosis. MV peak gradient, 19.8 mmHg. The mean mitral valve  gradient is 9.0 mmHg. Tricuspid Valve: The tricuspid valve is has been repaired/replaced. Tricuspid valve regurgitation is mild. Aortic Valve: The aortic valve is bicuspid. There is mild calcification of the aortic valve. Pulmonic Valve: Pulmonic valve regurgitation is trivial. Venous: The inferior vena cava is dilated in size with less than 50% respiratory variability, suggesting right atrial pressure of 15 mmHg. AORTIC VALVE LVOT Vmax:   85.50 cm/s LVOT Vmean:  53.900 cm/s LVOT VTI:    0.166 m MITRAL VALVE              TRICUSPID VALVE MV Peak grad: 19.8 mmHg   TV Peak grad:   4.0 mmHg MV Mean grad: 9.0 mmHg    TV Mean grad:   3.0 mmHg MV Vmax:      2.22 m/s    TV Vmax:        1.00 m/s MV Vmean:     141.3 cm/s  TV Vmean:       83.1 cm/s MR Peak grad: 104.4 mmHg  TV VTI:         0.26 msec MR Mean grad: 76.0 mmHg   TR Peak grad:   39.7 mmHg MR Vmax:      511.00 cm/s TR Vmax:        315.00 cm/s MR Vmean:     420.0 cm/s                           SHUNTS                           Systemic VTI: 0.17 m Glori Bickers MD Electronically signed by Glori Bickers MD Signature Date/Time: 05/24/2022/4:00:46 PM    Final     Cardiac Studies   Limited Echo 05/24/2022: Impressions:  1. Left ventricular ejection fraction, by estimation, is 50 to 55%. The  left ventricle has low normal function. The left ventricle demonstrates  regional wall motion abnormalities (see scoring diagram/findings for   description). There is mild concentric  left ventricular hypertrophy. There is hypokinesis of the left  ventricular, basal-mid inferior wall.   2. Right ventricular systolic function is moderately reduced. There is  moderately elevated pulmonary artery systolic pressure.   3. Left atrial size was severely dilated.   4. Right atrial size was moderately dilated.   5. The mitral valve has been repaired/replaced. Mild mitral valve  regurgitation. Mild to moderate mitral stenosis. The mean mitral valve  gradient is 9.0 mmHg.   6. The tricuspid valve is has been repaired/replaced. Tricuspid valve  regurgitation is mild.   7. The aortic valve is bicuspid. There is mild  calcification of the  aortic valve.   8. The inferior vena cava is dilated in size with <50% respiratory  variability, suggesting right atrial pressure of 15 mmHg.   9. No pericardial effusion.   Patient Profile     61 y.o. female  with a history of normal coronaries on recent cardiac catheterization in 02/2022, severe mitral vegetation and tricuspid regurgitation s/p recent mitral valve and tricuspid valve repairs on 04/19/2022, large pericardial effusion with tamponade s/p emergent pericardiocentesis on 05/10/2022, persistent atrial flutter on Coumadin, hypertension, type 2 diabetes mellitus with neuropathy, GERD, hepatitis C, fibromyalgia, chronic pain, and remote substance abuse.  Patient was recently discharged on 05/17/2022 after being admitted for a large pericardial effusion with evidence of tamponade.  She underwent emergent pericardiocentesis and remained in the CCU for several days with continued pericardial drainage.  Started on Colchicine for ongoing chest pain.  This admission was complicated by new onset atrial flutter.  She presented back to the ED on 05/22/2022 after a mechanical fall (she slipped when getting out of the bed and laid on the floor for over an hour).  Cardiology was consulted for further evaluation of ongoing  atrial flutter, NSVT, and ongoing chest pain.  Assessment & Plan    Acute on Chronic Diastolic CHF -BNP elevated at 792.  Chest x-ray showed cardiomegaly with vascular congestion and mild edema with progression since last radiograph. -Repeat limited Echo on 6/19 showed LVEF of 50-55% with hypokinesis of the basal-mid inferior wall, moderately reduced RV function, biatrial enlargement (left > right), mild to moderate mitral stenosis/mild mitral regurgitation with mean gradient of 9.0 mmHg, mild tricuspid regurgitation, and moderately elevated PASP.  No pericardial effusion was seen.   - Chest CT yesterday without PE however with pulmonary edema and pleural effusion  - Given IV lasix with improved breathing and excellent diuresis  - Net I & O -5.5L  - Weight down 212>>197lb - Continue metoprolol  - Will defer diuretics to MD.   Moderate Pulmonary Hypertension RV Dysfunction Echo this admission showed moderately reduced RV systolic function with moderately elevated PASP of 54.7 mmHg.  This is new as RV function was normal no prior Echoes. - No PE on CTA   Acute Pericarditis Recent Large Pericardial Effusion with Tamponade s/p Pericardiocentesis on 05/10/2022 - Repeat Echo this admission showed no evidence of pericardial effusion. - She continues to complain of chest pain but much improved from recent discharge.  - Colchicine was increased back to 0.'6mg'$  twice daily on 05/24/2022, Likely continue for 3 months, assess in outpatient setting  - She had normal coronary arteries on cardiac catheterization in 02/2022 so do not think her pain is coronary ischemia.   Persistent Atrial Flutter - Converted to sinus rhythm after increasing Cardizem CD - Continue Cardizem CD to '360mg'$  daily - Continue Lopressor '50mg'$  twice daily.  - Continue anticoagulation with Coumadin. INR 2.7 today. Dosing per Pharmacy.    Non-Sustained VT Had a 25 second run of NSVT on 05/23/2022 in the setting of hypomagnesemia. No  additional long runs of NSVT.  -Electrolytes corrected  - Continue Cardizem CD and Lopressor as above.   Severe Mitral Regurgitation and Severe Tricuspid Regurgitation s/p Mitral and Tricuspid Valve Repair on 04/19/2022 - Repeat Echo this admission showed mild to moderate mitral stenosis and mild mitral regurgitation with mean gradient of 9 mmHg which is up from 6.4 mmHg on 05/13/2022. Also showed mild tricuspid regurgitation. - Mitral valve gradient will need to continue to be followed.  Hypertension BP now stable  - Continue Cardizem CD to '360mg'$  daily. - Continue Lopressor '50mg'$  twice daily.   AKI Creatinine 2.07 on admission. Baseline 0.92. Initially was felt to be due to diuretics and she was treated with IV fluids but then she was felt to be volume overloaded and was given one dose of IV Lasix on 6/19. - Creatinine 1.05 yesterday, today 1.45 after IV lasix and CTA - Continue to monitor closely.  For questions or updates, please contact [place] Please consult [email or web address] for contact info under        SignedLeanor Kail, PA  05/26/2022, 9:09 AM

## 2022-05-26 NOTE — TOC Transition Note (Signed)
Transition of Care [facility]) - CM/SW Discharge Note   Patient Details  Name: [name] MRN: [id number] Date of Birth: [birth year]  [facility] CM/SW Contact:  Pollie Friar, RN Phone Number: 05/26/2022, 2:25 PM   Clinical Narrative:    Patient discharged home with self care.  CM consulted for a new PCP that will prescribe patients chronic pain medications. CM met with the patient and attempted to explain that PCP wont do long term pain meds and she will need a pain clinic. She was insistent that a PCP will order pain meds. CM called 3 places and 1 said "no" the others CM had to leave messages. CM will call the patient if they return voicemail left.  CM updated Dr Darrick Meigs and he agreed that she needs a pain clinic. Information on the AVS for pain clinic.  Pt has transportation home.   Final next level of care: Home/Self Care Barriers to Discharge: No Barriers Identified   Patient Goals and CMS Choice Patient states their goals for this hospitalization and ongoing recovery are:: to return home.   Choice offered to / list presented to : Patient  Discharge Placement                       Discharge Plan and Services   Discharge Planning Services: CM Consult Post Acute Care Choice: Home Health, Resumption of Svcs/PTA Provider          DME Arranged: N/A DME Agency: NA       HH Arranged: RN, Disease Management, PT, OT, Nurse's Aide [place] Agency: [place] Date [facility] Agency Contacted: 05/24/22 Time [place]: [phone] Representative spoke with at [place]: [place]) Interventions     Readmission Risk Interventions     No data to display

## 2022-05-28 ENCOUNTER — Emergency Department (HOSPITAL_COMMUNITY): Payer: Medicare Other

## 2022-05-28 ENCOUNTER — Ambulatory Visit: Payer: Medicare Other | Admitting: Physician Assistant

## 2022-05-28 ENCOUNTER — Encounter (HOSPITAL_COMMUNITY): Payer: Self-pay

## 2022-05-28 ENCOUNTER — Other Ambulatory Visit: Payer: Medicare Other

## 2022-05-28 ENCOUNTER — Inpatient Hospital Stay (HOSPITAL_COMMUNITY)
Admission: EM | Admit: 2022-05-28 | Discharge: 2022-05-30 | DRG: 682 | Disposition: A | Discharge status: Home Health | Payer: Medicare Other | Attending: Internal Medicine | Admitting: Internal Medicine

## 2022-05-28 ENCOUNTER — Ambulatory Visit: Payer: Self-pay | Admitting: *Deleted

## 2022-05-28 ENCOUNTER — Other Ambulatory Visit: Payer: Self-pay

## 2022-05-28 DIAGNOSIS — E1122 Type 2 diabetes mellitus with diabetic chronic kidney disease: Secondary | ICD-10-CM | POA: Diagnosis present

## 2022-05-28 DIAGNOSIS — Z8249 Family history of ischemic heart disease and other diseases of the circulatory system: Secondary | ICD-10-CM

## 2022-05-28 DIAGNOSIS — Z7982 Long term (current) use of aspirin: Secondary | ICD-10-CM

## 2022-05-28 DIAGNOSIS — R911 Solitary pulmonary nodule: Secondary | ICD-10-CM | POA: Diagnosis present

## 2022-05-28 DIAGNOSIS — E1140 Type 2 diabetes mellitus with diabetic neuropathy, unspecified: Secondary | ICD-10-CM | POA: Diagnosis present

## 2022-05-28 DIAGNOSIS — F17210 Nicotine dependence, cigarettes, uncomplicated: Secondary | ICD-10-CM | POA: Diagnosis present

## 2022-05-28 DIAGNOSIS — G894 Chronic pain syndrome: Secondary | ICD-10-CM | POA: Diagnosis present

## 2022-05-28 DIAGNOSIS — Z8719 Personal history of other diseases of the digestive system: Secondary | ICD-10-CM

## 2022-05-28 DIAGNOSIS — Z6829 Body mass index (BMI) 29.0-29.9, adult: Secondary | ICD-10-CM

## 2022-05-28 DIAGNOSIS — N179 Acute kidney failure, unspecified: Principal | ICD-10-CM | POA: Diagnosis present

## 2022-05-28 DIAGNOSIS — R531 Weakness: Secondary | ICD-10-CM | POA: Diagnosis present

## 2022-05-28 DIAGNOSIS — M25562 Pain in left knee: Secondary | ICD-10-CM | POA: Diagnosis present

## 2022-05-28 DIAGNOSIS — I480 Paroxysmal atrial fibrillation: Secondary | ICD-10-CM | POA: Diagnosis present

## 2022-05-28 DIAGNOSIS — E1169 Type 2 diabetes mellitus with other specified complication: Secondary | ICD-10-CM

## 2022-05-28 DIAGNOSIS — G928 Other toxic encephalopathy: Secondary | ICD-10-CM | POA: Diagnosis present

## 2022-05-28 DIAGNOSIS — I5032 Chronic diastolic (congestive) heart failure: Secondary | ICD-10-CM | POA: Diagnosis present

## 2022-05-28 DIAGNOSIS — Z7984 Long term (current) use of oral hypoglycemic drugs: Secondary | ICD-10-CM

## 2022-05-28 DIAGNOSIS — T4275XA Adverse effect of unspecified antiepileptic and sedative-hypnotic drugs, initial encounter: Secondary | ICD-10-CM | POA: Diagnosis present

## 2022-05-28 DIAGNOSIS — Y92009 Unspecified place in unspecified non-institutional (private) residence as the place of occurrence of the external cause: Secondary | ICD-10-CM | POA: Diagnosis not present

## 2022-05-28 DIAGNOSIS — Z7901 Long term (current) use of anticoagulants: Secondary | ICD-10-CM

## 2022-05-28 DIAGNOSIS — T502X5A Adverse effect of carbonic-anhydrase inhibitors, benzothiadiazides and other diuretics, initial encounter: Secondary | ICD-10-CM | POA: Diagnosis present

## 2022-05-28 DIAGNOSIS — Z885 Allergy status to narcotic agent status: Secondary | ICD-10-CM

## 2022-05-28 DIAGNOSIS — Z813 Family history of other psychoactive substance abuse and dependence: Secondary | ICD-10-CM

## 2022-05-28 DIAGNOSIS — I130 Hypertensive heart and chronic kidney disease with heart failure and stage 1 through stage 4 chronic kidney disease, or unspecified chronic kidney disease: Secondary | ICD-10-CM | POA: Diagnosis present

## 2022-05-28 DIAGNOSIS — D631 Anemia in chronic kidney disease: Secondary | ICD-10-CM | POA: Diagnosis present

## 2022-05-28 DIAGNOSIS — F419 Anxiety disorder, unspecified: Secondary | ICD-10-CM | POA: Diagnosis present

## 2022-05-28 DIAGNOSIS — I5022 Chronic systolic (congestive) heart failure: Secondary | ICD-10-CM | POA: Diagnosis present

## 2022-05-28 DIAGNOSIS — E860 Dehydration: Secondary | ICD-10-CM | POA: Diagnosis present

## 2022-05-28 DIAGNOSIS — Z833 Family history of diabetes mellitus: Secondary | ICD-10-CM

## 2022-05-28 DIAGNOSIS — K219 Gastro-esophageal reflux disease without esophagitis: Secondary | ICD-10-CM | POA: Diagnosis present

## 2022-05-28 DIAGNOSIS — Z20822 Contact with and (suspected) exposure to covid-19: Secondary | ICD-10-CM | POA: Diagnosis present

## 2022-05-28 DIAGNOSIS — E785 Hyperlipidemia, unspecified: Secondary | ICD-10-CM | POA: Diagnosis present

## 2022-05-28 DIAGNOSIS — E119 Type 2 diabetes mellitus without complications: Secondary | ICD-10-CM

## 2022-05-28 DIAGNOSIS — B182 Chronic viral hepatitis C: Secondary | ICD-10-CM | POA: Diagnosis present

## 2022-05-28 DIAGNOSIS — F332 Major depressive disorder, recurrent severe without psychotic features: Secondary | ICD-10-CM | POA: Diagnosis present

## 2022-05-28 DIAGNOSIS — R471 Dysarthria and anarthria: Secondary | ICD-10-CM | POA: Diagnosis present

## 2022-05-28 DIAGNOSIS — Z90710 Acquired absence of both cervix and uterus: Secondary | ICD-10-CM

## 2022-05-28 DIAGNOSIS — Z952 Presence of prosthetic heart valve: Secondary | ICD-10-CM | POA: Diagnosis not present

## 2022-05-28 DIAGNOSIS — M797 Fibromyalgia: Secondary | ICD-10-CM | POA: Diagnosis present

## 2022-05-28 DIAGNOSIS — E669 Obesity, unspecified: Secondary | ICD-10-CM | POA: Diagnosis present

## 2022-05-28 DIAGNOSIS — M25512 Pain in left shoulder: Secondary | ICD-10-CM | POA: Diagnosis present

## 2022-05-28 DIAGNOSIS — G9340 Encephalopathy, unspecified: Secondary | ICD-10-CM | POA: Diagnosis not present

## 2022-05-28 DIAGNOSIS — N182 Chronic kidney disease, stage 2 (mild): Secondary | ICD-10-CM | POA: Diagnosis present

## 2022-05-28 DIAGNOSIS — Z8049 Family history of malignant neoplasm of other genital organs: Secondary | ICD-10-CM

## 2022-05-28 DIAGNOSIS — Z811 Family history of alcohol abuse and dependence: Secondary | ICD-10-CM

## 2022-05-28 DIAGNOSIS — Z818 Family history of other mental and behavioral disorders: Secondary | ICD-10-CM

## 2022-05-28 DIAGNOSIS — Z79899 Other long term (current) drug therapy: Secondary | ICD-10-CM

## 2022-05-28 LAB — DIFFERENTIAL
Abs Immature Granulocytes: 0.02 10*3/uL (ref 0.00–0.07)
Basophils Absolute: 0 10*3/uL (ref 0.0–0.1)
Basophils Relative: 1 %
Eosinophils Absolute: 0.3 10*3/uL (ref 0.0–0.5)
Eosinophils Relative: 4 %
Immature Granulocytes: 0 %
Lymphocytes Relative: 22 %
Lymphs Abs: 1.5 10*3/uL (ref 0.7–4.0)
Monocytes Absolute: 0.7 10*3/uL (ref 0.1–1.0)
Monocytes Relative: 9 %
Neutro Abs: 4.4 10*3/uL (ref 1.7–7.7)
Neutrophils Relative %: 64 %

## 2022-05-28 LAB — COMPREHENSIVE METABOLIC PANEL
ALT: 10 U/L (ref 0–44)
AST: 17 U/L (ref 15–41)
Albumin: 3.6 g/dL (ref 3.5–5.0)
Alkaline Phosphatase: 105 U/L (ref 38–126)
Anion gap: 11 (ref 5–15)
BUN: 20 mg/dL (ref 8–23)
CO2: 24 mmol/L (ref 22–32)
Calcium: 8.1 mg/dL — ABNORMAL LOW (ref 8.9–10.3)
Chloride: 99 mmol/L (ref 98–111)
Creatinine, Ser: 2.6 mg/dL — ABNORMAL HIGH (ref 0.44–1.00)
GFR, Estimated: 20 mL/min — ABNORMAL LOW (ref >60)
Glucose, Bld: 71 mg/dL (ref 70–99)
Potassium: 3.7 mmol/L (ref 3.5–5.1)
Sodium: 134 mmol/L — ABNORMAL LOW (ref 135–145)
Total Bilirubin: 0.5 mg/dL (ref 0.3–1.2)
Total Protein: 6.5 g/dL (ref 6.5–8.1)

## 2022-05-28 LAB — CBC
HCT: 30.8 % — ABNORMAL LOW (ref 36.0–46.0)
Hemoglobin: 9.9 g/dL — ABNORMAL LOW (ref 12.0–15.0)
MCH: 28.9 pg (ref 26.0–34.0)
MCHC: 32.1 g/dL (ref 30.0–36.0)
MCV: 90.1 fL (ref 80.0–100.0)
Platelets: 212 10*3/uL (ref 150–400)
RBC: 3.42 MIL/uL — ABNORMAL LOW (ref 3.87–5.11)
RDW: 15.9 % — ABNORMAL HIGH (ref 11.5–15.5)
WBC: 6.9 10*3/uL (ref 4.0–10.5)
nRBC: 0 % (ref 0.0–0.2)

## 2022-05-28 LAB — URINALYSIS, ROUTINE W REFLEX MICROSCOPIC
Bilirubin Urine: NEGATIVE
Glucose, UA: NEGATIVE mg/dL
Hgb urine dipstick: NEGATIVE
Ketones, ur: NEGATIVE mg/dL
Leukocytes,Ua: NEGATIVE
Nitrite: NEGATIVE
Protein, ur: NEGATIVE mg/dL
Specific Gravity, Urine: 1.01 (ref 1.005–1.030)
pH: 5 (ref 5.0–8.0)

## 2022-05-28 LAB — RAPID URINE DRUG SCREEN, HOSP PERFORMED
Amphetamines: NOT DETECTED
Barbiturates: NOT DETECTED
Benzodiazepines: NOT DETECTED
Cocaine: NOT DETECTED
Opiates: POSITIVE — AB
Tetrahydrocannabinol: NOT DETECTED

## 2022-05-28 LAB — ETHANOL: Alcohol, Ethyl (B): <10 mg/dL (ref <10)

## 2022-05-28 LAB — PROTIME-INR
INR: 2.6 — ABNORMAL HIGH (ref 0.8–1.2)
Prothrombin Time: 27.9 seconds — ABNORMAL HIGH (ref 11.4–15.2)

## 2022-05-28 LAB — RESP PANEL BY RT-PCR (FLU A&B, COVID) ARPGX2
Influenza A by PCR: NEGATIVE
Influenza B by PCR: NEGATIVE
SARS Coronavirus 2 by RT PCR: NEGATIVE

## 2022-05-28 LAB — APTT: aPTT: 39 seconds — ABNORMAL HIGH (ref 24–36)

## 2022-05-28 MED ORDER — SODIUM CHLORIDE 0.9 % IV BOLUS
1000.0000 mL | Freq: Once | INTRAVENOUS | Status: AC
  Administered 2022-05-28: 1000 mL via INTRAVENOUS

## 2022-05-28 NOTE — ED Notes (Signed)
Patient arrived back from MRI. Patient placed on bed pan.

## 2022-05-29 ENCOUNTER — Encounter (HOSPITAL_COMMUNITY): Payer: Self-pay | Admitting: Family Medicine

## 2022-05-29 DIAGNOSIS — G9340 Encephalopathy, unspecified: Secondary | ICD-10-CM | POA: Diagnosis present

## 2022-05-29 DIAGNOSIS — I5022 Chronic systolic (congestive) heart failure: Secondary | ICD-10-CM | POA: Diagnosis present

## 2022-05-29 DIAGNOSIS — I5032 Chronic diastolic (congestive) heart failure: Secondary | ICD-10-CM | POA: Diagnosis not present

## 2022-05-29 DIAGNOSIS — G894 Chronic pain syndrome: Secondary | ICD-10-CM | POA: Diagnosis not present

## 2022-05-29 DIAGNOSIS — N179 Acute kidney failure, unspecified: Secondary | ICD-10-CM | POA: Diagnosis not present

## 2022-05-29 LAB — GLUCOSE, CAPILLARY
Glucose-Capillary: 100 mg/dL — ABNORMAL HIGH (ref 70–99)
Glucose-Capillary: 89 mg/dL (ref 70–99)
Glucose-Capillary: 73 mg/dL (ref 70–99)
Glucose-Capillary: 119 mg/dL — ABNORMAL HIGH (ref 70–99)

## 2022-05-29 LAB — BASIC METABOLIC PANEL
Anion gap: 9 (ref 5–15)
BUN: 17 mg/dL (ref 8–23)
CO2: 23 mmol/L (ref 22–32)
Calcium: 7.7 mg/dL — ABNORMAL LOW (ref 8.9–10.3)
Chloride: 108 mmol/L (ref 98–111)
Creatinine, Ser: 1.67 mg/dL — ABNORMAL HIGH (ref 0.44–1.00)
GFR, Estimated: 34 mL/min — ABNORMAL LOW (ref >60)
Glucose, Bld: 105 mg/dL — ABNORMAL HIGH (ref 70–99)
Potassium: 3.9 mmol/L (ref 3.5–5.1)
Sodium: 140 mmol/L (ref 135–145)

## 2022-05-29 LAB — CBC
HCT: 27 % — ABNORMAL LOW (ref 36.0–46.0)
Hemoglobin: 8.6 g/dL — ABNORMAL LOW (ref 12.0–15.0)
MCH: 28 pg (ref 26.0–34.0)
MCHC: 31.9 g/dL (ref 30.0–36.0)
MCV: 87.9 fL (ref 80.0–100.0)
Platelets: 185 10*3/uL (ref 150–400)
RBC: 3.07 MIL/uL — ABNORMAL LOW (ref 3.87–5.11)
RDW: 15.9 % — ABNORMAL HIGH (ref 11.5–15.5)
WBC: 4.8 10*3/uL (ref 4.0–10.5)
nRBC: 0 % (ref 0.0–0.2)

## 2022-05-29 LAB — PROTIME-INR
INR: 2.5 — ABNORMAL HIGH (ref 0.8–1.2)
Prothrombin Time: 26.5 seconds — ABNORMAL HIGH (ref 11.4–15.2)

## 2022-05-29 MED ORDER — ACETAMINOPHEN 325 MG PO TABS: 650.0000 mg | ORAL_TABLET | Freq: Four times a day (QID) | ORAL | Status: DC | PRN

## 2022-05-29 MED ORDER — DICLOFENAC SODIUM 1 % EX GEL
2.0000 g | Freq: Four times a day (QID) | CUTANEOUS | Status: DC | PRN
  Filled 2022-05-29: qty 100

## 2022-05-29 MED ORDER — ACETAMINOPHEN 650 MG RE SUPP: 650.0000 mg | Freq: Four times a day (QID) | RECTAL | Status: DC | PRN

## 2022-05-29 MED ORDER — GABAPENTIN 600 MG PO TABS: 300.0000 mg | ORAL_TABLET | Freq: Two times a day (BID) | ORAL | Status: DC

## 2022-05-29 MED ORDER — INSULIN ASPART 100 UNIT/ML IJ SOLN: 0.0000 [IU] | Freq: Three times a day (TID) | INTRAMUSCULAR | Status: DC

## 2022-05-29 MED ORDER — DILTIAZEM HCL ER COATED BEADS 360 MG PO CP24
360.0000 mg | ORAL_CAPSULE | Freq: Every day | ORAL | Status: DC
  Administered 2022-05-30: 360 mg via ORAL
  Filled 2022-05-29 (×2): qty 1

## 2022-05-29 MED ORDER — PANTOPRAZOLE SODIUM 40 MG PO TBEC
40.0000 mg | DELAYED_RELEASE_TABLET | Freq: Every day | ORAL | Status: DC
  Administered 2022-05-29 – 2022-05-30 (×2): 40 mg via ORAL
  Filled 2022-05-29 (×2): qty 1

## 2022-05-29 MED ORDER — OXYCODONE HCL 5 MG PO TABS
5.0000 mg | ORAL_TABLET | ORAL | Status: DC | PRN
  Administered 2022-05-29 (×4): 5 mg via ORAL
  Filled 2022-05-29 (×6): qty 1

## 2022-05-29 MED ORDER — METHOCARBAMOL 500 MG PO TABS
500.0000 mg | ORAL_TABLET | Freq: Three times a day (TID) | ORAL | Status: DC | PRN
  Administered 2022-05-29: 500 mg via ORAL
  Filled 2022-05-29 (×2): qty 1

## 2022-05-29 MED ORDER — ONDANSETRON HCL 4 MG/2ML IJ SOLN: 4.0000 mg | Freq: Four times a day (QID) | INTRAMUSCULAR | Status: DC | PRN

## 2022-05-29 MED ORDER — COLCHICINE 0.6 MG PO TABS
0.6000 mg | ORAL_TABLET | Freq: Two times a day (BID) | ORAL | Status: DC
Start: 2022-05-29 — End: 2022-05-30
  Administered 2022-05-29 – 2022-05-30 (×4): 0.6 mg via ORAL
  Filled 2022-05-29 (×4): qty 1

## 2022-05-29 MED ORDER — ONDANSETRON HCL 4 MG PO TABS: 4.0000 mg | ORAL_TABLET | Freq: Four times a day (QID) | ORAL | Status: DC | PRN

## 2022-05-29 MED ORDER — GABAPENTIN 600 MG PO TABS: 600.0000 mg | ORAL_TABLET | Freq: Three times a day (TID) | ORAL | Status: DC

## 2022-05-29 MED ORDER — SENNOSIDES-DOCUSATE SODIUM 8.6-50 MG PO TABS: 1.0000 | ORAL_TABLET | Freq: Every evening | ORAL | Status: DC | PRN

## 2022-05-29 MED ORDER — TRAZODONE HCL 50 MG PO TABS
200.0000 mg | ORAL_TABLET | Freq: Every evening | ORAL | Status: DC | PRN
Start: 2022-05-29 — End: 2022-05-30
  Administered 2022-05-29: 200 mg via ORAL
  Filled 2022-05-29: qty 4

## 2022-05-29 MED ORDER — FLUOXETINE HCL 20 MG PO CAPS
20.0000 mg | ORAL_CAPSULE | Freq: Every morning | ORAL | Status: DC
  Administered 2022-05-29 – 2022-05-30 (×2): 20 mg via ORAL
  Filled 2022-05-29 (×2): qty 1

## 2022-05-29 MED ORDER — LIFITEGRAST 5 % OP SOLN: Freq: Every day | OPHTHALMIC | Status: DC | PRN

## 2022-05-29 MED ORDER — BUPROPION HCL ER (XL) 300 MG PO TB24
300.0000 mg | ORAL_TABLET | Freq: Every day | ORAL | Status: DC
  Administered 2022-05-29 – 2022-05-30 (×2): 300 mg via ORAL
  Filled 2022-05-29 (×2): qty 1

## 2022-05-29 MED ORDER — ALBUTEROL SULFATE (2.5 MG/3ML) 0.083% IN NEBU: 3.0000 mL | INHALATION_SOLUTION | Freq: Four times a day (QID) | RESPIRATORY_TRACT | Status: DC | PRN

## 2022-05-29 MED ORDER — WARFARIN - PHARMACIST DOSING INPATIENT: Freq: Every day | Status: DC

## 2022-05-29 MED ORDER — WARFARIN SODIUM 5 MG PO TABS
5.0000 mg | ORAL_TABLET | Freq: Every day | ORAL | Status: DC
  Administered 2022-05-29 (×2): 5 mg via ORAL
  Filled 2022-05-29 (×2): qty 1

## 2022-05-29 MED ORDER — HYDROXYZINE HCL 25 MG PO TABS
25.0000 mg | ORAL_TABLET | ORAL | Status: DC | PRN
  Administered 2022-05-29: 25 mg via ORAL
  Filled 2022-05-29: qty 1

## 2022-05-29 MED ORDER — SODIUM CHLORIDE 0.9% FLUSH
3.0000 mL | Freq: Two times a day (BID) | INTRAVENOUS | Status: DC
  Administered 2022-05-29 – 2022-05-30 (×4): 3 mL via INTRAVENOUS

## 2022-05-29 MED ORDER — ASPIRIN 81 MG PO TBEC
81.0000 mg | DELAYED_RELEASE_TABLET | Freq: Every day | ORAL | Status: DC
  Administered 2022-05-29 – 2022-05-30 (×2): 81 mg via ORAL
  Filled 2022-05-29 (×2): qty 1

## 2022-05-29 MED ORDER — METOPROLOL TARTRATE 50 MG PO TABS
50.0000 mg | ORAL_TABLET | Freq: Two times a day (BID) | ORAL | Status: DC
  Administered 2022-05-30: 50 mg via ORAL
  Filled 2022-05-29 (×2): qty 1

## 2022-05-29 MED ORDER — ATORVASTATIN CALCIUM 10 MG PO TABS
20.0000 mg | ORAL_TABLET | Freq: Every day | ORAL | Status: DC
  Administered 2022-05-29 – 2022-05-30 (×2): 20 mg via ORAL
  Filled 2022-05-29 (×2): qty 2

## 2022-05-29 NOTE — Progress Notes (Addendum)
PROGRESS NOTE        PATIENT DETAILS Name: [name] Age: 61 y.o. Sex: female Date of Birth: 04-Apr-1961 Admit Date: 05/28/2022 Admitting Physician Briscoe Deutscher, MD PCP:Pcp, No  Brief Summary: Patient is a 61 y.o.  female DM-2, HTN, chronic HFpEF, mitral/tricuspid insufficiency-s/p repair May 2023, PAF, chronic pain, anxiety/depression-who presented to the hospital with altered mental status/slurred speech-patient was found to have AKI-acute toxic metabolic encephalopathy and subsequently admitted to the hospitalist service.  See below for further details.  Significant events: 6/23>> admit to [facility]-AKI-acute toxic metabolic encephalopathy  Significant studies: 6/23>> x-ray left knee: Stable degenerative changes-no acute findings 6/23>> CXR: No PNA 6/23>> MRI brain: No acute CVA 6/23>> MRI C-spine: Motion artifact-patent spinal canal-foraminal impingement at C4-C5, C5-6, and T2-3  Significant microbiology data: 6/23>> COVID/influenza PCR: Negative  Procedures:   Consults: None  Subjective: Lying comfortably in bed-denies any chest pain or shortness of breath.  Completely awake and alert.  Speech is clear.  Objective: Vitals: Blood pressure 101/63, pulse 77, temperature 98 F (36.7 C), temperature source Oral, resp. rate 16, height 5\' 11"  (1.803 m), weight 95.7 kg, last menstrual period 12/06/1976, SpO2 93 %.   Exam: Gen Exam:Alert awake-not in any distress HEENT:atraumatic, normocephalic Chest: B/L clear to auscultation anteriorly CVS:S1S2 regular Abdomen:soft non tender, non distended Extremities:no edema Neurology: Non focal but has difficulty lifting her shoulder due to chronic shoulder pain. Skin: no rash  Pertinent Labs/Radiology:    Latest Ref Rng & Units 05/29/2022    5:16 AM 05/28/2022    4:39 PM 05/26/2022    1:38 AM  CBC  WBC 4.0 - 10.5 K/uL 4.8  6.9  7.2   Hemoglobin 12.0 - 15.0 g/dL 8.6  9.9  16.1   Hematocrit 36.0 -  46.0 % 27.0  30.8  30.4   Platelets 150 - 400 K/uL 185  212  214     Lab Results  Component Value Date   NA 140 05/29/2022   K 3.9 05/29/2022   CL 108 05/29/2022   CO2 23 05/29/2022      Assessment/Plan: AKI on CKD stage II: AKI hemodynamically mediated due to dehydration/diuretic use-improving with supportive care-avoid nephrotoxic agents-monitor closely-repeat electrolytes tomorrow.  Acute toxic metabolic encephalopathy: Likely in the setting of multiple sedating agents with worsening renal function.  Significant improvement this morning-completely awake and alert-dysarthria has resolved.  MRI negative for CVA.  Continue supportive care.  Cautiously continue with reduced dose of gabapentin and narcotics.  Chronic HFpEF: Volume status stable-continue to hold diuretics  History of severe MR/TR-s/p repair on 04/19/2022  PAF: Continue telemetry monitoring-INR therapeutic-Coumadin per pharmacy.  Continue diltiazem and metoprolol.  DM-2 (A1c 6.1 on 5/11):monitor CBGs on SSI.  Resume oral hypoglycemic agents on discharge.  HLD: Continue statin  Anxiety/depression: Stable-continue Wellbutrin/Flomax  Chronic pain syndrome: Has chronic left shoulder pain-and as result is unable to lift her left shoulder.  See above regarding plans to continue gabapentin/narcotics at reduced dosage.  BMI: Estimated body mass index is 29.43 kg/m as calculated from the following:   Height as of this encounter: 5\' 11"  (1.803 m).   Weight as of this encounter: 95.7 kg.   Code status:   Code Status: Full Code   DVT Prophylaxis: warfarin (COUMADIN) tablet 5 mg    Family Communication: Sister-[phone]-updated on 6/24  Disposition Plan: Status is: Inpatient Remains  inpatient appropriate because: Resolving AKI/encephalopathy-noted stable for discharge-probably home in the next 1-2 days.   Planned Discharge Destination:Home health   Diet: Diet Order             Diet Heart Room  service appropriate? Yes; Fluid consistency: Thin  Diet effective now                     Antimicrobial agents: Anti-infectives (From admission, onward)    None        MEDICATIONS: Scheduled Meds:  aspirin EC  81 mg Oral Daily   atorvastatin  20 mg Oral Daily   buPROPion  300 mg Oral Daily   colchicine  0.6 mg Oral BID   diltiazem  360 mg Oral Daily   FLUoxetine  20 mg Oral q morning   [START ON 05/30/2022] gabapentin  300 mg Oral BID   metoprolol tartrate  50 mg Oral BID   pantoprazole  40 mg Oral Daily   sodium chloride flush  3 mL Intravenous Q12H   warfarin  5 mg Oral q1600   Warfarin - Pharmacist Dosing Inpatient   Does not apply q1600   Continuous Infusions: PRN Meds:.acetaminophen **OR** acetaminophen, albuterol, diclofenac Sodium, hydrOXYzine, Lifitegrast, methocarbamol, ondansetron **OR** ondansetron (ZOFRAN) IV, oxyCODONE, senna-docusate, traZODone   I have personally reviewed following labs and imaging studies  LABORATORY DATA: CBC: Recent Labs  Lab 05/23/22 1025 05/24/22 0327 05/25/22 0252 05/26/22 0138 05/28/22 1639 05/29/22 0516  WBC 7.2 7.2 6.8 7.2 6.9 4.8  NEUTROABS 5.2 4.5 3.6 3.6 4.4  --   HGB 10.4* 9.6* 10.3* 10.2* 9.9* 8.6*  HCT 32.4* 29.5* 31.2* 30.4* 30.8* 27.0*  MCV 87.6 86.5 85.0 84.9 90.1 87.9  PLT 215 230 250 214 212 185    Basic Metabolic Panel: Recent Labs  Lab 05/24/22 0327 05/25/22 0252 05/26/22 0138 05/28/22 1639 05/29/22 0516  NA 138 138 138 134* 140  K 4.1 4.0 3.8 3.7 3.9  CL 110 104 104 99 108  CO2 20* 23 27 24 23   GLUCOSE 77 84 96 71 105*  BUN 12 9 10 20 17   CREATININE 1.14* 1.05* 1.45* 2.60* 1.67*  CALCIUM 7.8* 8.6* 8.7* 8.1* 7.7*  MG 1.3* 1.5* 1.7  --   --     GFR: Estimated Creatinine Clearance: 44.6 mL/min (A) (by C-G formula based on SCr of 1.67 mg/dL (H)).  Liver Function Tests: Recent Labs  Lab 05/22/22 1531 05/25/22 0252 05/28/22 1639  AST 19 15 17   ALT 11 10 10   ALKPHOS 119 99 105   BILITOT 0.5 0.7 0.5  PROT 6.3* 6.4* 6.5  ALBUMIN 3.5 3.2* 3.6   No results for input(s): "LIPASE", "AMYLASE" in the last 168 hours. No results for input(s): "AMMONIA" in the last 168 hours.  Coagulation Profile: Recent Labs  Lab 05/24/22 0327 05/25/22 0252 05/26/22 0138 05/28/22 1639 05/29/22 0516  INR 2.2* 2.7* 2.8* 2.6* 2.5*    Cardiac Enzymes: Recent Labs  Lab 05/22/22 1727  CKTOTAL 153    BNP (last 3 results) No results for input(s): "PROBNP" in the last 8760 hours.  Lipid Profile: No results for input(s): "CHOL", "HDL", "LDLCALC", "TRIG", "CHOLHDL", "LDLDIRECT" in the last 72 hours.  Thyroid Function Tests: No results for input(s): "TSH", "T4TOTAL", "FREET4", "T3FREE", "THYROIDAB" in the last 72 hours.  Anemia Panel: No results for input(s): "VITAMINB12", "FOLATE", "FERRITIN", "TIBC", "IRON", "RETICCTPCT" in the last 72 hours.  Urine analysis:    Component Value Date/Time   COLORURINE YELLOW 05/28/2022  1928   APPEARANCEUR HAZY (A) 05/28/2022 1928   LABSPEC 1.010 05/28/2022 1928   PHURINE 5.0 05/28/2022 1928   GLUCOSEU NEGATIVE 05/28/2022 1928   GLUCOSEU NEG mg/dL 16/09/9603 5409   HGBUR NEGATIVE 05/28/2022 1928   HGBUR negative 02/27/2007 1327   BILIRUBINUR NEGATIVE 05/28/2022 1928   KETONESUR NEGATIVE 05/28/2022 1928   PROTEINUR NEGATIVE 05/28/2022 1928   UROBILINOGEN 1.0 06/10/2010 1729   NITRITE NEGATIVE 05/28/2022 1928   LEUKOCYTESUR NEGATIVE 05/28/2022 1928    Sepsis Labs: Lactic Acid, Venous    Component Value Date/Time   LATICACIDVEN 2.6 (HH) 05/10/2022 0939    MICROBIOLOGY: Recent Results (from the past 240 hour(s))  Resp Panel by RT-PCR (Flu A&B, Covid) Anterior Nasal Swab     Status: None   Collection Time: 05/28/22  4:28 PM   Specimen: Anterior Nasal Swab  Result Value Ref Range Status   SARS Coronavirus 2 by RT PCR NEGATIVE NEGATIVE Final    Comment: (NOTE) SARS-CoV-2 target nucleic acids are NOT DETECTED.  The SARS-CoV-2 RNA  is generally detectable in upper respiratory specimens during the acute phase of infection. The lowest concentration of SARS-CoV-2 viral copies this assay can detect is 138 copies/mL. A negative result does not preclude SARS-Cov-2 infection and should not be used as the sole basis for treatment or other patient management decisions. A negative result may occur with  improper specimen collection/handling, submission of specimen other than nasopharyngeal swab, presence of viral mutation(s) within the areas targeted by this assay, and inadequate number of viral copies(<138 copies/mL). A negative result must be combined with clinical observations, patient history, and epidemiological information. The expected result is Negative.  Fact Sheet for Patients:  BloggerCourse.com  Fact Sheet for Healthcare Providers:  SeriousBroker.it  This test is no t yet approved or cleared by the [place] FDA and  has been authorized for detection and/or diagnosis of SARS-CoV-2 by FDA under an Emergency Use Authorization (EUA). This EUA will remain  in effect (meaning this test can be used) for the duration of the COVID-19 declaration under Section 564(b)(1) of the Act, 21 U.S.C.section 360bbb-3(b)(1), unless the authorization is terminated  or revoked sooner.       Influenza A by PCR NEGATIVE NEGATIVE Final   Influenza B by PCR NEGATIVE NEGATIVE Final    Comment: (NOTE) The Xpert Xpress SARS-CoV-2/FLU/RSV plus assay is intended as an aid in the diagnosis of influenza from Nasopharyngeal swab specimens and should not be used as a sole basis for treatment. Nasal washings and aspirates are unacceptable for Xpert Xpress SARS-CoV-2/FLU/RSV testing.  Fact Sheet for Patients: BloggerCourse.com  Fact Sheet for Healthcare Providers: SeriousBroker.it  This test is not yet approved or cleared by the  [place] FDA and has been authorized for detection and/or diagnosis of SARS-CoV-2 by FDA under an Emergency Use Authorization (EUA). This EUA will remain in effect (meaning this test can be used) for the duration of the COVID-19 declaration under Section 564(b)(1) of the Act, 21 U.S.C. section 360bbb-3(b)(1), unless the authorization is terminated or revoked.  Performed at [facility], [address], [place], [place] [address]     RADIOLOGY STUDIES/RESULTS: MR BRAIN WO CONTRAST  Result Date: 05/28/2022 CLINICAL DATA:  Acute myelopathy, cervical spine. Left-sided weakness and slurred speech for 2 days EXAM: MRI HEAD WITHOUT CONTRAST MRI CERVICAL SPINE WITHOUT CONTRAST TECHNIQUE: Multiplanar, multiecho pulse sequences of the brain and surrounding structures, and cervical spine, to include the craniocervical junction and cervicothoracic junction, were obtained without intravenous  contrast. COMPARISON:  05/22/2022 CT of the head and cervical spine. FINDINGS: MRI HEAD FINDINGS Brain: No acute infarction, hemorrhage, hydrocephalus, extra-axial collection or mass lesion. Vascular: Normal flow voids. Skull and upper cervical spine: Normal marrow signal. Sinuses/Orbits: Negative MRI CERVICAL SPINE FINDINGS Alignment: Straightening of the cervical spine. Vertebrae: No fracture, evidence of discitis, or bone lesion. Cord: Normal signal and morphology. Posterior Fossa, vertebral arteries, paraspinal tissues: Negative. Disc levels: C2-3: Facet spurring with mild to moderate left foraminal narrowing. C3-4: Disc narrowing and bulging with uncovertebral ridging eccentric to the left where there is mild left foraminal narrowing. C4-5: Disc narrowing and bulging with uncovertebral ridging bilaterally. High-grade left and more moderate right foraminal impingement. Disc osteophyte complex contacts the ventral cord without suspected compression C5-6: Disc collapse with disc bulging and uncovertebral ridging  causing biforaminal impingement. Mild spinal stenosis C6-7: Disc narrowing and bulging with ridging causing likely mild bilateral foraminal narrowing. Patent spinal canal C7-T1:Mild disc bulging. T2-3: Notable disc space narrowing with biforaminal protrusion and endplate/facet spurring causing moderate bilateral foraminal stenosis. IMPRESSION: Brain MRI: No acute finding or explanation for weakness. Cervical MRI: 1. Significant motion artifact throughout the scan. 2. Bilateral foraminal impingement at C4-5, C5-6, and T2-3. 3. Diffusely patent spinal canal. Electronically Signed   By: Tiburcio Pea M.D.   On: 05/28/2022 18:42   MR Cervical Spine Wo Contrast  Result Date: 05/28/2022 CLINICAL DATA:  Acute myelopathy, cervical spine. Left-sided weakness and slurred speech for 2 days EXAM: MRI HEAD WITHOUT CONTRAST MRI CERVICAL SPINE WITHOUT CONTRAST TECHNIQUE: Multiplanar, multiecho pulse sequences of the brain and surrounding structures, and cervical spine, to include the craniocervical junction and cervicothoracic junction, were obtained without intravenous contrast. COMPARISON:  05/22/2022 CT of the head and cervical spine. FINDINGS: MRI HEAD FINDINGS Brain: No acute infarction, hemorrhage, hydrocephalus, extra-axial collection or mass lesion. Vascular: Normal flow voids. Skull and upper cervical spine: Normal marrow signal. Sinuses/Orbits: Negative MRI CERVICAL SPINE FINDINGS Alignment: Straightening of the cervical spine. Vertebrae: No fracture, evidence of discitis, or bone lesion. Cord: Normal signal and morphology. Posterior Fossa, vertebral arteries, paraspinal tissues: Negative. Disc levels: C2-3: Facet spurring with mild to moderate left foraminal narrowing. C3-4: Disc narrowing and bulging with uncovertebral ridging eccentric to the left where there is mild left foraminal narrowing. C4-5: Disc narrowing and bulging with uncovertebral ridging bilaterally. High-grade left and more moderate right  foraminal impingement. Disc osteophyte complex contacts the ventral cord without suspected compression C5-6: Disc collapse with disc bulging and uncovertebral ridging causing biforaminal impingement. Mild spinal stenosis C6-7: Disc narrowing and bulging with ridging causing likely mild bilateral foraminal narrowing. Patent spinal canal C7-T1:Mild disc bulging. T2-3: Notable disc space narrowing with biforaminal protrusion and endplate/facet spurring causing moderate bilateral foraminal stenosis. IMPRESSION: Brain MRI: No acute finding or explanation for weakness. Cervical MRI: 1. Significant motion artifact throughout the scan. 2. Bilateral foraminal impingement at C4-5, C5-6, and T2-3. 3. Diffusely patent spinal canal. Electronically Signed   By: Tiburcio Pea M.D.   On: 05/28/2022 18:42   DG Knee Complete 4 Views Left  Result Date: 05/28/2022 CLINICAL DATA:  Left knee pain. EXAM: LEFT KNEE - COMPLETE 4+ VIEW COMPARISON:  05/22/2022 FINDINGS: Stable degenerative changes most notably in the medial compartment. No acute bony findings or osteochondral abnormality. No obvious joint effusion. IMPRESSION: Stable degenerative changes but no acute bony findings. Electronically Signed   By: Rudie Meyer M.D.   On: 05/28/2022 17:02   DG Chest 1 View  Result Date: 05/28/2022 CLINICAL DATA:  Weakness for a few days. EXAM: CHEST  1 VIEW COMPARISON:  Chest CT 05/25/2022 FINDINGS: The cardiac silhouette, mediastinal and hilar contours are within normal limits and stable. Stable surgical changes from valve replacement surgery. Much improved lung aeration with resolution of pulmonary edema. Suspect small residual right pleural effusion. No pulmonary infiltrates. IMPRESSION: Much improved lung aeration with resolution of pulmonary edema. Small residual right pleural effusion. Electronically Signed   By: Rudie Meyer M.D.   On: 05/28/2022 17:01     LOS: 1 day   Jeoffrey Massed, MD  [facility]    To  contact the attending provider between 7A-7P or the covering provider during after hours 7P-7A, please log into the web site [email or web address] and access using universal [place] password for that web site. If you do not have the password, please call the hospital operator.  05/29/2022, 9:23 AM

## 2022-05-30 DIAGNOSIS — G894 Chronic pain syndrome: Secondary | ICD-10-CM | POA: Diagnosis not present

## 2022-05-30 DIAGNOSIS — I480 Paroxysmal atrial fibrillation: Secondary | ICD-10-CM | POA: Diagnosis not present

## 2022-05-30 DIAGNOSIS — G9340 Encephalopathy, unspecified: Secondary | ICD-10-CM | POA: Diagnosis not present

## 2022-05-30 DIAGNOSIS — N179 Acute kidney failure, unspecified: Secondary | ICD-10-CM | POA: Diagnosis not present

## 2022-05-30 LAB — CBC
HCT: 28.2 % — ABNORMAL LOW (ref 36.0–46.0)
Hemoglobin: 8.9 g/dL — ABNORMAL LOW (ref 12.0–15.0)
MCH: 27.8 pg (ref 26.0–34.0)
MCHC: 31.6 g/dL (ref 30.0–36.0)
MCV: 88.1 fL (ref 80.0–100.0)
Platelets: 200 10*3/uL (ref 150–400)
RBC: 3.2 MIL/uL — ABNORMAL LOW (ref 3.87–5.11)
RDW: 15.9 % — ABNORMAL HIGH (ref 11.5–15.5)
WBC: 5 10*3/uL (ref 4.0–10.5)
nRBC: 0 % (ref 0.0–0.2)

## 2022-05-30 LAB — GLUCOSE, CAPILLARY
Glucose-Capillary: 103 mg/dL — ABNORMAL HIGH (ref 70–99)
Glucose-Capillary: 127 mg/dL — ABNORMAL HIGH (ref 70–99)

## 2022-05-30 LAB — BASIC METABOLIC PANEL
Anion gap: 3 — ABNORMAL LOW (ref 5–15)
BUN: 14 mg/dL (ref 8–23)
CO2: 24 mmol/L (ref 22–32)
Calcium: 8 mg/dL — ABNORMAL LOW (ref 8.9–10.3)
Chloride: 111 mmol/L (ref 98–111)
Creatinine, Ser: 1.14 mg/dL — ABNORMAL HIGH (ref 0.44–1.00)
GFR, Estimated: 54 mL/min — ABNORMAL LOW (ref >60)
Glucose, Bld: 81 mg/dL (ref 70–99)
Potassium: 4.2 mmol/L (ref 3.5–5.1)
Sodium: 138 mmol/L (ref 135–145)

## 2022-05-30 LAB — PROTIME-INR
INR: 2.1 — ABNORMAL HIGH (ref 0.8–1.2)
Prothrombin Time: 23.5 seconds — ABNORMAL HIGH (ref 11.4–15.2)

## 2022-05-30 MED ORDER — GABAPENTIN 600 MG PO TABS: 300.0000 mg | ORAL_TABLET | Freq: Three times a day (TID) | ORAL | Status: DC

## 2022-05-30 MED ORDER — OXYCODONE HCL 15 MG PO TABS: 7.5000 mg | ORAL_TABLET | Freq: Three times a day (TID) | ORAL | 0 refills | Status: DC | PRN

## 2022-05-30 MED ORDER — OXYCODONE HCL 5 MG PO TABS
10.0000 mg | ORAL_TABLET | Freq: Four times a day (QID) | ORAL | Status: DC | PRN
  Administered 2022-05-30: 10 mg via ORAL
  Filled 2022-05-30: qty 2

## 2022-05-30 MED ORDER — GABAPENTIN 300 MG PO CAPS
300.0000 mg | ORAL_CAPSULE | Freq: Three times a day (TID) | ORAL | Status: DC
  Administered 2022-05-30: 300 mg via ORAL
  Filled 2022-05-30: qty 1

## 2022-05-30 MED ORDER — METOPROLOL TARTRATE 5 MG/5ML IV SOLN
5.0000 mg | Freq: Once | INTRAVENOUS | Status: AC
  Administered 2022-05-30: 5 mg via INTRAVENOUS
  Filled 2022-05-30: qty 5

## 2022-05-31 ENCOUNTER — Ambulatory Visit (HOSPITAL_BASED_OUTPATIENT_CLINIC_OR_DEPARTMENT_OTHER): Payer: Medicare Other

## 2022-05-31 DIAGNOSIS — I340 Nonrheumatic mitral (valve) insufficiency: Secondary | ICD-10-CM | POA: Diagnosis not present

## 2022-05-31 LAB — ECHOCARDIOGRAM COMPLETE
Area-P 1/2: 2.62 cm2
MV VTI: 1.39 cm2
S' Lateral: 3.5 cm

## 2022-06-03 ENCOUNTER — Other Ambulatory Visit: Payer: Medicare Other

## 2022-06-04 ENCOUNTER — Other Ambulatory Visit: Payer: Self-pay

## 2022-06-04 ENCOUNTER — Telehealth: Payer: Self-pay | Admitting: *Deleted

## 2022-06-04 DIAGNOSIS — Z9889 Other specified postprocedural states: Secondary | ICD-10-CM

## 2022-06-04 DIAGNOSIS — I480 Paroxysmal atrial fibrillation: Secondary | ICD-10-CM

## 2022-06-04 MED ORDER — WARFARIN SODIUM 5 MG PO TABS: 5.0000 mg | ORAL_TABLET | Freq: Every day | ORAL | 0 refills | Status: DC

## 2022-06-04 NOTE — Telephone Encounter (Addendum)
Called pt since she missed her Anticoagulation Appt at 1030am today; she stated she did not recall having an appt. She then stated she must have wrote it on the wrong paper. She stated she don't have the warfarin medication anyway's, advised that during her conversation yesterday with Nurse Vernie Shanks, she told her she had it and she stated I did but later did not find it. Advised I would send in a few tablets to get her to the appt since she needs to be monitored while on this med,. She asked to book the appt for next week and to call her back when she can pull over to write the appt down. Advised I would try to call back at 12 noon to ensure she got the date and time for next when she is available.   Called pt back at 1204pm and confirmed appt 06/09/2022 at 230pm. Stayed on the phone with her 10 minutes regarding this appt. Then she found the appt that for today that she missed. She stated she hight lighted 06/09/2022 appt so she does not miss it. She is at the pharmacy to pick up her warfarin tablets.

## 2022-06-09 ENCOUNTER — Telehealth: Payer: Self-pay | Admitting: *Deleted

## 2022-06-09 ENCOUNTER — Ambulatory Visit: Payer: Medicare Other | Admitting: Student

## 2022-06-09 NOTE — Telephone Encounter (Signed)
Called pt since she missed another Anticoagulation Appt. She stated she was here and was worked in to be seen, after much discussion she was in the building and went to [facility]. They worked her in to see [facility]. Advised that we have to get her rescheduled and after much discussion she was able to agree to an appointment on Friday.

## 2022-06-09 NOTE — Progress Notes (Unsigned)
Cardiology Office Note:    Date:  06/10/2022   ID:  [name], DOB 1961-01-13, MRN [id number]  PCP:  Pcp, No  [facility] Cardiologist:  Werner Lean, MD  [facility] Electrophysiologist:  None   Chief Complaint: Hospital follow-up  History of Present Illness:    [name] is a 61 y.o. female with a hx of severe mitral vegetation and tricuspid regurgitation s/p recent mitral valve and tricuspid valve repairs on 04/19/2022, large pericardial effusion with tamponade s/p emergent pericardiocentesis on 05/10/2022, persistent atrial flutter on Coumadin, hypertension, type 2 diabetes mellitus with neuropathy, GERD, hepatitis C, fibromyalgia, chronic pain, chronic diastolic heart failure moderate pulmonary hypertension, and remote substance abuse presents for follow-up.  Normal coronaries on cardiac catheterization 02/2022. Admitted early 05/2022 for large pericardial effusion with tamponade and underwent emergent pericardiocentesis.  She was treated with colchicine for ongoing chest pain.  She was readmitted after fall.  Found in new onset atrial flutter.  Converted to sinus rhythm after increasing Cardizem CD.  Repeat echocardiogram during admission without evidence of pericardial effusion however showed new RV dysfunction  with moderately elevated PASP of 54.7 mmHg.  No PE on CTA of chest but showed pulmonary edema and pleural effusion.  She was diuresed with improved breathing.  Pain was improving on colchicine.  She was readmitted 2 days later following discharge for acute metabolic encephalopathy secondary to multiple sedating agent with worsening renal failure.  MRI of brain without acute CVA. She was discharged on 05/30/22.  Here today for follow. Feeling well.  She denies chest pain, shortness of breath, orthopnea, PND, syncope, lower extremity edema or melena.  No bleeding issue.  She missed Coumadin check appointment and has recheck appointment tomorrow.   Recommended to attend appointment.  Some reason she is not taking her colchicine.  No pleuritic symptoms.  We will continue to hold.  Past Medical History:  Diagnosis Date   Acute pericardial effusion 05/17/2022   AKI (acute kidney injury) ([place]) 05/17/2022   Anxiety    Arthritis    Cervicalgia    Chondromalacia of patella    Chronic neck pain    Chronic pain syndrome    Depression    secondary to loss of her son at age 16   Diabetes mellitus    Diabetic neuropathy ([place])    DMII (diabetes mellitus, type 2) ([place])    Dysrhythmia    Enthesopathy of hip region    Fibromyalgia    GERD (gastroesophageal reflux disease)    Hep C w/o coma, chronic ([place])    Hepatitis C    diagnosed 2005   Hypertension    Insomnia    Insomnia    Low back pain    Lumbago    Mitral regurgitation    Obesity    Pain in joint, upper arm    Primary localized osteoarthrosis, lower leg    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   Substance abuse ([place])    sober since 2001   Tobacco abuse    Tricuspid regurgitation    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 [facility];  Service: Cardiovascular;  Laterality: N/A;   RIGHT/LEFT HEART CATH AND CORONARY ANGIOGRAPHY N/A 02/12/2022   Procedure: RIGHT/LEFT HEART CATH AND CORONARY ANGIOGRAPHY;  Surgeon: Belva Crome, MD;  Location: [place] [facility];  Service: Cardiovascular;  Laterality: N/A;   TEE WITHOUT  CARDIOVERSION N/A 02/03/2022   Procedure: TRANSESOPHAGEAL ECHOCARDIOGRAM (TEE);  Surgeon: Josue Hector, MD;  Location: [facility];  Service: Cardiovascular;  Laterality: N/A;   TEE WITHOUT CARDIOVERSION N/A 04/19/2022   Procedure: TRANSESOPHAGEAL ECHOCARDIOGRAM (TEE);  Surgeon: Melrose Nakayama, MD;  Location: [place];  Service: Open Heart Surgery;  Laterality: N/A;   TRICUSPID VALVE REPLACEMENT N/A 04/19/2022   Procedure: TRICUSPID VALVE REPAIR WITH 32MM MC3 ANNULOPLASTY RING;  Surgeon: Melrose Nakayama, MD;  Location: [place];  Service: Open Heart Surgery;  Laterality: N/A;    Current Medications: Current Meds  Medication Sig   ACCU-CHEK FASTCLIX LANCETS MISC 1 each by Does not apply route daily. Check blood sugar once daily. 250.00   acetaminophen (TYLENOL) 500 MG tablet Take 1,000 mg by mouth every 6 (six) hours as needed for mild pain.   albuterol (PROVENTIL HFA;VENTOLIN HFA) 108 (90 Base) MCG/ACT inhaler Inhale 2 puffs into the lungs every 6 (six) hours as needed for wheezing or shortness of breath.    aspirin 81 MG EC tablet Take 81 mg by mouth daily.   atorvastatin (LIPITOR) 20 MG tablet Take 20 mg by mouth daily.   blood glucose meter kit and supplies KIT Dispense based on patient and insurance preference. Use up to four times daily as directed.   buPROPion (WELLBUTRIN XL) 300 MG 24 hr tablet Take 300 mg by mouth in the morning.   Cyanocobalamin (VITAMIN B 12 PO) Take 500 mcg by mouth daily.   diclofenac Sodium (VOLTAREN) 1 % GEL Apply 2-4 g topically 4 (four) times daily as needed (joint pain).   diltiazem (CARDIZEM CD) 360 MG 24 hr capsule Take 1 capsule (360 mg total) by mouth daily.   ferrous sulfate 325 (65 FE) MG tablet Take 325 mg by mouth daily with breakfast.   FLUoxetine (PROZAC) 20 MG capsule Take 20 mg by mouth every morning.   furosemide (LASIX) 20 MG tablet Take 1 tablet (20 mg total) by mouth daily.   gabapentin (NEURONTIN) 600 MG tablet Take 0.5 tablets (300  mg total) by mouth 3 (three) times daily.   hydrOXYzine (ATARAX) 25 MG tablet Take 25 mg by mouth every 4 (four) hours as needed for anxiety.   Lifitegrast (XIIDRA OP) Place 1 drop into both eyes daily.   linagliptin (TRADJENTA) 5 MG TABS tablet Take 5 mg by mouth in the morning.   methocarbamol (ROBAXIN) 500 MG tablet Take 1 tablet (500 mg total) by mouth every 8 (eight) hours as needed for muscle spasms.   metoprolol tartrate (LOPRESSOR) 50 MG tablet Take 1 tablet (50 mg total) by mouth 2 (two) times daily.   naloxone (NARCAN) nasal spray 4 mg/0.1 mL Place 1 spray into the nose as needed (accidental overdose).   nystatin ointment (MYCOSTATIN) Apply 1 Application topically 2 (two) times daily as needed (irriation).   omeprazole (PRILOSEC) 20 MG capsule Take 20 mg by mouth daily.   oxyCODONE (ROXICODONE) 15 MG immediate release tablet Take 0.5 tablets (7.5 mg total) by  mouth every 8 (eight) hours as needed for pain.   PREMARIN 0.45 MG tablet Take 0.45 mg by mouth daily.   traZODone (DESYREL) 100 MG tablet Take 2.5 tablets (250 mg total) by mouth at bedtime as needed for sleep. (Patient taking differently: Take 200 mg by mouth at bedtime as needed for sleep.)   warfarin (COUMADIN) 5 MG tablet Take 1 tablet (5 mg total) by mouth daily.     Allergies:   Hydrocodone-acetaminophen and Hydrocodone   Social History   Socioeconomic History   Marital status: Single    Spouse name: Not on file   Number of children: 2   Years of education: Not on file   Highest education level: Not on file  Occupational History   Occupation: CNA    Comment: worked for 15 years   Occupation: disability  Tobacco Use   Smoking status: Every Day    Packs/day: 0.25    Years: 35.00    Total pack years: 8.75    Types: Cigarettes   Smokeless tobacco: Never   Tobacco comments:    4 cigarettes a day  Vaping Use   Vaping Use: Some days  Substance and Sexual Activity   Alcohol use: No   Drug use: No   Sexual  activity: Not Currently    Comment: Celebate since 2000  Other Topics Concern   Not on file  Social History Narrative   Born and raised by mom until mom died when she was 14yo. After that she stayed with her aunt. Dad was married to someone else and was never around.  Pt has 1 brother and 1 sister and pt is the middle child. Pt had a very rough childhood. Never married. 2 kids one son died several  yrs ago and other son has Schizophrenia. Pt has graduated HS and has some college. Pt is on disability and is unemployed. 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       Financial assistance approved for 100% discount at [facility] and has [facility] card   [employer]  September 29, 2010 2:27 PM   Social Determinants of Health   Financial Resource Strain: Not on file  Food Insecurity: Not on file  Transportation Needs: Not on file  Physical Activity: Not on file  Stress: Not on file  Social Connections: Not on file     Family History: The patient's family history includes Alcohol abuse in her brother, father, and sister; Arthritis in an other family member; Diabetes in an other family member; Drug abuse in her brother and sister; Heart attack (age of onset: 57) in her son; Hypertension in an other family member; Schizophrenia in her son; Uterine cancer in her mother.    ROS:   Please see the history of present illness.    All other systems reviewed and are negative.   EKGs/Labs/Other Studies Reviewed:    The following studies were reviewed today: Limited Echo 05/24/2022: Impressions:  1. Left ventricular ejection fraction, by estimation, is 50 to 55%. The  left ventricle has low normal function. The left ventricle demonstrates  regional wall motion abnormalities (see scoring diagram/findings for  description). There is mild concentric  left ventricular hypertrophy. There is hypokinesis of the left  ventricular, basal-mid inferior wall.   2. Right ventricular systolic function is moderately reduced.  There is  moderately elevated pulmonary artery systolic pressure.   3. Left atrial size was severely dilated.   4. Right atrial size was moderately dilated.  5. The mitral valve has been repaired/replaced. Mild mitral valve  regurgitation. Mild to moderate mitral stenosis. The mean mitral valve  gradient is 9.0 mmHg.   6. The tricuspid valve is has been repaired/replaced. Tricuspid valve  regurgitation is mild.   7. The aortic valve is bicuspid. There is mild calcification of the  aortic valve.   8. The inferior vena cava is dilated in size with <50% respiratory  variability, suggesting right atrial pressure of 15 mmHg.   9. No pericardial effusion.   EKG:  EKG is  ordered today.  The ekg ordered today demonstrates sinus rhythm  Recent Labs: 05/24/2022: B Natriuretic Peptide 792.0 05/26/2022: Magnesium 1.7 05/28/2022: ALT 10 05/30/2022: BUN 14; Creatinine, Ser 1.14; Hemoglobin 8.9; Platelets 200; Potassium 4.2; Sodium 138  Recent Lipid Panel    Component Value Date/Time   CHOL 180 03/25/2016 0909   TRIG 122 03/25/2016 0909   HDL 39 (L) 03/25/2016 0909   CHOLHDL 4.6 03/25/2016 0909   VLDL 24 03/25/2016 0909   LDLCALC 117 03/25/2016 0909     Risk Assessment/Calculations:    CHA2DS2-VASc Score = 3   This indicates a 3.2% annual risk of stroke. The patient's score is based upon: CHF History: 0 HTN History: 1 Diabetes History: 1 Stroke History: 0 Vascular Disease History: 0 Age Score: 0 Gender Score: 1      Physical Exam:    VS:  BP 110/78   Pulse 91   Ht $R'5\' 11"'zb$  (1.803 m)   Wt 200 lb 6.4 oz (90.9 kg)   LMP 12/06/1976   SpO2 99%   BMI 27.95 kg/m     Wt Readings from Last 3 Encounters:  06/10/22 200 lb 6.4 oz (90.9 kg)  05/29/22 211 lb (95.7 kg)  05/26/22 197 lb 12.8 oz (89.7 kg)     GEN:  Well nourished, well developed in no acute distress HEENT: Normal NECK: No JVD; No carotid bruits LYMPHATICS: No lymphadenopathy CARDIAC: RRR, no murmurs, rubs,  gallops RESPIRATORY:  Clear to auscultation without rales, wheezing or rhonchi  ABDOMEN: Soft, non-tender, non-distended MUSCULOSKELETAL:  No edema; No deformity  SKIN: Warm and dry NEUROLOGIC:  Alert and oriented x 3 PSYCHIATRIC:  Normal affect   ASSESSMENT AND PLAN:    Recent Large Pericardial Effusion with Tamponade s/p Pericardiocentesis on 05/10/2022 Pericarditis - Last echo 6/19 without evidence of pericardial effusion  -She is no longer taking colchicine.  No pleuritic symptoms.  No need to restart.   3. Severe Mitral Regurgitation and Severe Tricuspid Regurgitation s/p Mitral and Tricuspid Valve Repair on 04/19/2022 - Last echo showed mild to moderate mitral stenosis and mild mitral regurgitation with mean gradient of 9 mmHg which is up from 6.4 mmHg on 05/13/2022. Also showed mild tricuspid regurgitation. - Mitral valve gradient will need to continue to be followed.   4. Paroxymal atrial flutter Maintaining sinus rhythm.  Continue Cardizem CD 360 mg daily and Lopressor 50 mg twice daily.  5. Chronic anticoagulation  Has follow-up with Coumadin clinic tomorrow.  Recommended strong compliance.  6. HTN Blood pressure stable and controlled on current medications  7. Chronic diastolic CHF -Euvolemic Continue current therapy  Medication Adjustments/Labs and Tests Ordered: Current medicines are reviewed at length with the patient today.  Concerns regarding medicines are outlined above.  Orders Placed This Encounter  Procedures   EKG 12-Lead   No orders of the defined types were placed in this encounter.   Patient Instructions  Medication Instructions:  Your physician recommends that  you continue on your current medications as directed. Please refer to the Current Medication list given to you today. *If you need a refill on your cardiac medications before your next appointment, please call your pharmacy*   Lab Work: None Ordered   Testing/Procedures: None  ordered   Follow-Up: At [facility], you and your health needs are our priority.  As part of our continuing mission to provide you with exceptional heart care, we have created designated Provider Care Teams.  These Care Teams include your primary Cardiologist (physician) and Advanced Practice Providers (APPs -  Physician Assistants and Nurse Practitioners) who all work together to provide you with the care you need, when you need it.  We recommend signing up for the patient portal called "MyChart".  Sign up information is provided on this After Visit Summary.  MyChart is used to connect with patients for Virtual Visits (Telemedicine).  Patients are able to view lab/test results, encounter notes, upcoming appointments, etc.  Non-urgent messages can be sent to your provider as well.   To learn more about what you can do with MyChart, go to [email or web address].    Your next appointment:   2-3 month(s)  The format for your next appointment:   In Person  Provider:   Werner Lean, MD     Other Instructions   Important Information About [facility]         Jarrett Soho, PA  06/10/2022 11:00 AM    [place]

## 2022-06-10 ENCOUNTER — Ambulatory Visit (INDEPENDENT_AMBULATORY_CARE_PROVIDER_SITE_OTHER): Payer: Medicare Other | Admitting: Physician Assistant

## 2022-06-10 ENCOUNTER — Encounter: Payer: Self-pay | Admitting: Physician Assistant

## 2022-06-10 VITALS — BP 110/78 | HR 91 | Ht 71.0 in | Wt 200.4 lb

## 2022-06-10 DIAGNOSIS — I483 Typical atrial flutter: Secondary | ICD-10-CM

## 2022-06-10 DIAGNOSIS — I340 Nonrheumatic mitral (valve) insufficiency: Secondary | ICD-10-CM | POA: Diagnosis not present

## 2022-06-10 DIAGNOSIS — E1159 Type 2 diabetes mellitus with other circulatory complications: Secondary | ICD-10-CM

## 2022-06-10 DIAGNOSIS — Z9889 Other specified postprocedural states: Secondary | ICD-10-CM | POA: Diagnosis not present

## 2022-06-10 DIAGNOSIS — I152 Hypertension secondary to endocrine disorders: Secondary | ICD-10-CM

## 2022-06-10 DIAGNOSIS — I5032 Chronic diastolic (congestive) heart failure: Secondary | ICD-10-CM

## 2022-06-10 NOTE — Patient Instructions (Signed)
Medication Instructions:  Your physician recommends that you continue on your current medications as directed. Please refer to the Current Medication list given to you today. *If you need a refill on your cardiac medications before your next appointment, please call your pharmacy*   Lab Work: None Ordered   Testing/Procedures: None ordered   Follow-Up: At [facility], you and your health needs are our priority.  As part of our continuing mission to provide you with exceptional heart care, we have created designated Provider Care Teams.  These Care Teams include your primary Cardiologist (physician) and Advanced Practice Providers (APPs -  Physician Assistants and Nurse Practitioners) who all work together to provide you with the care you need, when you need it.  We recommend signing up for the patient portal called "MyChart".  Sign up information is provided on this After Visit Summary.  MyChart is used to connect with patients for Virtual Visits (Telemedicine).  Patients are able to view lab/test results, encounter notes, upcoming appointments, etc.  Non-urgent messages can be sent to your provider as well.   To learn more about what you can do with MyChart, go to [email or web address].    Your next appointment:   2-3 month(s)  The format for your next appointment:   In Person  Provider:   Werner Lean, MD     Other Instructions   Important Information About [facility]

## 2022-06-11 ENCOUNTER — Other Ambulatory Visit: Payer: Self-pay | Admitting: *Deleted

## 2022-06-11 ENCOUNTER — Ambulatory Visit (INDEPENDENT_AMBULATORY_CARE_PROVIDER_SITE_OTHER): Payer: Medicare Other | Admitting: *Deleted

## 2022-06-11 DIAGNOSIS — I4891 Unspecified atrial fibrillation: Secondary | ICD-10-CM | POA: Diagnosis not present

## 2022-06-11 DIAGNOSIS — I480 Paroxysmal atrial fibrillation: Secondary | ICD-10-CM

## 2022-06-11 DIAGNOSIS — Z9889 Other specified postprocedural states: Secondary | ICD-10-CM | POA: Diagnosis not present

## 2022-06-11 DIAGNOSIS — Z5181 Encounter for therapeutic drug level monitoring: Secondary | ICD-10-CM | POA: Insufficient documentation

## 2022-06-11 LAB — POCT INR: INR: 1.7 — AB (ref 2.0–3.0)

## 2022-06-11 MED ORDER — WARFARIN SODIUM 5 MG PO TABS: ORAL_TABLET | ORAL | 0 refills | Status: DC

## 2022-06-11 NOTE — Patient Instructions (Addendum)
A full discussion of the nature of anticoagulants has been carried out.  A benefit risk analysis has been presented to the patient, so that they understand the justification for choosing anticoagulation at this time. The need for frequent and regular monitoring, precise dosage adjustment and compliance is stressed.  Side effects of potential bleeding are discussed.  The patient should avoid any OTC items containing aspirin or ibuprofen, and should avoid great swings in general diet.  Avoid alcohol consumption.  Call if any signs of abnormal bleeding.   Description   Today take 1.5 tablets then continue taking 1 tablet daily of Warfarin. Recheck INR in 1 week. Anticoagulation Clinic [phone]

## 2022-06-14 ENCOUNTER — Telehealth: Payer: Self-pay

## 2022-06-14 NOTE — Telephone Encounter (Signed)
NOTES SCANNED TO REFERRAL 

## 2022-06-16 ENCOUNTER — Ambulatory Visit (INDEPENDENT_AMBULATORY_CARE_PROVIDER_SITE_OTHER): Payer: Medicare Other | Admitting: *Deleted

## 2022-06-16 DIAGNOSIS — Z5181 Encounter for therapeutic drug level monitoring: Secondary | ICD-10-CM | POA: Diagnosis not present

## 2022-06-16 DIAGNOSIS — I4891 Unspecified atrial fibrillation: Secondary | ICD-10-CM

## 2022-06-16 LAB — POCT INR: INR: 1.8 — AB (ref 2.0–3.0)

## 2022-06-16 NOTE — Patient Instructions (Signed)
Description   Today take 1.5 tablets then start taking 1 tablet daily of Warfarin except 1.5 tablets on Saundays. Recheck INR in 1 week. Anticoagulation Clinic [phone]

## 2022-06-23 ENCOUNTER — Ambulatory Visit (INDEPENDENT_AMBULATORY_CARE_PROVIDER_SITE_OTHER): Payer: Medicare Other | Admitting: Podiatry

## 2022-06-23 ENCOUNTER — Encounter: Payer: Self-pay | Admitting: Podiatry

## 2022-06-23 DIAGNOSIS — M79675 Pain in left toe(s): Secondary | ICD-10-CM

## 2022-06-23 DIAGNOSIS — M79674 Pain in right toe(s): Secondary | ICD-10-CM

## 2022-06-23 DIAGNOSIS — E0843 Diabetes mellitus due to underlying condition with diabetic autonomic (poly)neuropathy: Secondary | ICD-10-CM

## 2022-06-23 DIAGNOSIS — B351 Tinea unguium: Secondary | ICD-10-CM

## 2022-06-23 DIAGNOSIS — N182 Chronic kidney disease, stage 2 (mild): Secondary | ICD-10-CM

## 2022-06-23 DIAGNOSIS — N179 Acute kidney failure, unspecified: Secondary | ICD-10-CM | POA: Diagnosis not present

## 2022-06-23 NOTE — Progress Notes (Signed)

## 2022-06-24 ENCOUNTER — Ambulatory Visit (INDEPENDENT_AMBULATORY_CARE_PROVIDER_SITE_OTHER): Payer: Medicare Other | Admitting: *Deleted

## 2022-06-24 DIAGNOSIS — I480 Paroxysmal atrial fibrillation: Secondary | ICD-10-CM

## 2022-06-24 DIAGNOSIS — I4891 Unspecified atrial fibrillation: Secondary | ICD-10-CM | POA: Diagnosis not present

## 2022-06-24 DIAGNOSIS — Z5181 Encounter for therapeutic drug level monitoring: Secondary | ICD-10-CM | POA: Diagnosis not present

## 2022-06-24 DIAGNOSIS — Z9889 Other specified postprocedural states: Secondary | ICD-10-CM | POA: Diagnosis not present

## 2022-06-24 LAB — POCT INR: INR: 2.5 (ref 2.0–3.0)

## 2022-06-24 NOTE — Patient Instructions (Signed)
Description   Continue taking 1 tablet daily of Warfarin except 1.5 tablets on Saundays. Recheck INR in 1 week. Anticoagulation Clinic [phone]

## 2022-07-01 ENCOUNTER — Telehealth: Payer: Self-pay | Admitting: Pharmacist

## 2022-07-01 NOTE — Telephone Encounter (Signed)
Angel with [facility] called and reported she can not get in touch with patient for home INR check.  Called 3 times and left 2 messages and patient knew she was supposed to come today.

## 2022-07-02 ENCOUNTER — Other Ambulatory Visit: Payer: Self-pay | Admitting: Internal Medicine

## 2022-07-02 ENCOUNTER — Ambulatory Visit (INDEPENDENT_AMBULATORY_CARE_PROVIDER_SITE_OTHER): Payer: Medicare Other | Admitting: *Deleted

## 2022-07-02 DIAGNOSIS — I4891 Unspecified atrial fibrillation: Secondary | ICD-10-CM | POA: Diagnosis not present

## 2022-07-02 DIAGNOSIS — I480 Paroxysmal atrial fibrillation: Secondary | ICD-10-CM

## 2022-07-02 DIAGNOSIS — Z5181 Encounter for therapeutic drug level monitoring: Secondary | ICD-10-CM

## 2022-07-02 LAB — POCT INR: INR: 2 (ref 2.0–3.0)

## 2022-07-02 NOTE — Telephone Encounter (Signed)
Called and spoke with pt. Ms Nardelli stated someone came to her house one day this week but she couldn't remember if it was a home health nurse. I made her awsre that we need to have her INR checked as soon as possible and the Home Health nurse has been trying to contact her. I asked for pt to leave her phone nearby and [place] or my self will call her back as soon as I find out of nurse can come out today to check INR.   [place] to see if INR can be checked today before the weekend. Left Glenard Haring, RN a voicemail.

## 2022-07-02 NOTE — Telephone Encounter (Signed)
Refer to anticoagulation encounter

## 2022-07-06 ENCOUNTER — Telehealth: Payer: Self-pay | Admitting: *Deleted

## 2022-07-06 NOTE — Telephone Encounter (Signed)
   Pre-operative Risk Assessment    Patient Name: [name]  DOB: June 05, 1961 MRN: [id number]      Request for Surgical Clearance    Procedure:   CERVICAL ESI INJECTION  Date of Surgery:  Clearance 07/27/22                                 Surgeon:  NOT LISTED  Surgeon's Group or Practice Name:  [facility] Phone number:  [phone] [name] Fax number:  [phone]   Type of Clearance Requested:   - Medical  - Pharmacy:  Hold Warfarin (Coumadin) x 7 DAYS PRIOR   Type of Anesthesia:  Not Indicated   Additional requests/questions:    [name]   07/06/2022, 5:50 PM

## 2022-07-07 NOTE — Telephone Encounter (Signed)
Patient with diagnosis of atrial fibrillation on warfarin for anticoagulation.    Procedure: ESI cervical injection Date of procedure: 07/27/22   CHA2DS2-VASc Score = 3   This indicates a 3.2% annual risk of stroke. The patient's score is based upon: CHF History: 0 HTN History: 1 Diabetes History: 1 Stroke History: 0 Vascular Disease History: 0 Age Score: 0 Gender Score: 1   CrCl 73 Platelet count 200  In addition to atrial fibrillation, patient had mitral and tricuspid valve repairs on 04/19/22.  This procedure date is just past 90 days from procedure.  Would need 5 day warfarin hold for spinal procedure.  Will review with Dr. Gasper Sells to determine if patient okay to hold.  **This guidance is not considered finalized until pre-operative APP has relayed final recommendations.**

## 2022-07-09 ENCOUNTER — Telehealth: Payer: Self-pay | Admitting: *Deleted

## 2022-07-09 NOTE — Telephone Encounter (Signed)
Glenard Haring, nurse, with [facility] called and stated she needs to cancel a visit for the pt today. She stated she received a message from the pt stating the pt is out of town. She called to let the Anticoagulation Clinic know that they would have to discharge th pt since she is able to travel, has been noncompliant with visits, and she was now only has been needing med management.   Will call the pt to set up an appointment in the office as she is on a monitored medication. Called pt to set up an appointment and had to leave a message. Will try back later.

## 2022-07-12 NOTE — Telephone Encounter (Signed)
Called pt to schedule anticoagulation appt. Pt stated she would not be able to come to any appts until the end of next week. Scheduled appt on 07/23/22 at 8:45am. Educated the pt on the importance of appt and checking INR. Pt verbalized understanding.

## 2022-07-12 NOTE — Telephone Encounter (Signed)
Primary Cardiologist:Mahesh A Chandrasekhar, MD  Chart reviewed as part of pre-operative protocol coverage.  [name] had mitral and tricuspid valve repairs on 04/19/22. It was deemed reasonable that patient hold warfarin for 5 days prior to injection, however our Coumadin Clinic team has been unable to reach the patient to schedule appropriate INR follow-up.   I called [facility] and spoke with Jennell Corner and apprised her of the situation. She asked that I fax the clearance back with this information and she would contact the patient and notify the requesting physician.    Emmaline Life, NP-C    07/12/2022, 8:31 AM [place] [name] Office [phone] Fax [phone]

## 2022-07-22 ENCOUNTER — Encounter (HOSPITAL_COMMUNITY): Payer: Self-pay

## 2022-07-22 ENCOUNTER — Emergency Department (HOSPITAL_COMMUNITY): Payer: Medicare Other

## 2022-07-22 ENCOUNTER — Other Ambulatory Visit: Payer: Self-pay

## 2022-07-22 ENCOUNTER — Inpatient Hospital Stay (HOSPITAL_COMMUNITY)
Admission: EM | Admit: 2022-07-22 | Discharge: 2022-07-24 | DRG: 309 | Disposition: A | Discharge status: Home / Self Care | Payer: Medicare Other | Attending: Internal Medicine | Admitting: Internal Medicine

## 2022-07-22 DIAGNOSIS — D6869 Other thrombophilia: Secondary | ICD-10-CM | POA: Diagnosis not present

## 2022-07-22 DIAGNOSIS — Z9889 Other specified postprocedural states: Secondary | ICD-10-CM

## 2022-07-22 DIAGNOSIS — E1140 Type 2 diabetes mellitus with diabetic neuropathy, unspecified: Secondary | ICD-10-CM | POA: Diagnosis present

## 2022-07-22 DIAGNOSIS — Z952 Presence of prosthetic heart valve: Secondary | ICD-10-CM | POA: Diagnosis not present

## 2022-07-22 DIAGNOSIS — I4892 Unspecified atrial flutter: Secondary | ICD-10-CM | POA: Diagnosis present

## 2022-07-22 DIAGNOSIS — F17210 Nicotine dependence, cigarettes, uncomplicated: Secondary | ICD-10-CM | POA: Diagnosis present

## 2022-07-22 DIAGNOSIS — Z811 Family history of alcohol abuse and dependence: Secondary | ICD-10-CM

## 2022-07-22 DIAGNOSIS — Z79899 Other long term (current) drug therapy: Secondary | ICD-10-CM

## 2022-07-22 DIAGNOSIS — M797 Fibromyalgia: Secondary | ICD-10-CM | POA: Diagnosis present

## 2022-07-22 DIAGNOSIS — Z7901 Long term (current) use of anticoagulants: Secondary | ICD-10-CM

## 2022-07-22 DIAGNOSIS — I959 Hypotension, unspecified: Secondary | ICD-10-CM | POA: Diagnosis present

## 2022-07-22 DIAGNOSIS — I5032 Chronic diastolic (congestive) heart failure: Secondary | ICD-10-CM | POA: Diagnosis present

## 2022-07-22 DIAGNOSIS — R001 Bradycardia, unspecified: Principal | ICD-10-CM | POA: Diagnosis present

## 2022-07-22 DIAGNOSIS — G894 Chronic pain syndrome: Secondary | ICD-10-CM | POA: Diagnosis present

## 2022-07-22 DIAGNOSIS — I130 Hypertensive heart and chronic kidney disease with heart failure and stage 1 through stage 4 chronic kidney disease, or unspecified chronic kidney disease: Secondary | ICD-10-CM | POA: Diagnosis present

## 2022-07-22 DIAGNOSIS — I10 Essential (primary) hypertension: Secondary | ICD-10-CM

## 2022-07-22 DIAGNOSIS — I4891 Unspecified atrial fibrillation: Secondary | ICD-10-CM | POA: Diagnosis present

## 2022-07-22 DIAGNOSIS — B182 Chronic viral hepatitis C: Secondary | ICD-10-CM | POA: Diagnosis present

## 2022-07-22 DIAGNOSIS — F32A Depression, unspecified: Secondary | ICD-10-CM | POA: Diagnosis present

## 2022-07-22 DIAGNOSIS — Z8049 Family history of malignant neoplasm of other genital organs: Secondary | ICD-10-CM

## 2022-07-22 DIAGNOSIS — N179 Acute kidney failure, unspecified: Secondary | ICD-10-CM | POA: Diagnosis present

## 2022-07-22 DIAGNOSIS — I440 Atrioventricular block, first degree: Secondary | ICD-10-CM | POA: Diagnosis present

## 2022-07-22 DIAGNOSIS — E1122 Type 2 diabetes mellitus with diabetic chronic kidney disease: Secondary | ICD-10-CM | POA: Diagnosis present

## 2022-07-22 DIAGNOSIS — Z818 Family history of other mental and behavioral disorders: Secondary | ICD-10-CM

## 2022-07-22 DIAGNOSIS — E875 Hyperkalemia: Secondary | ICD-10-CM | POA: Diagnosis present

## 2022-07-22 DIAGNOSIS — I152 Hypertension secondary to endocrine disorders: Secondary | ICD-10-CM | POA: Diagnosis present

## 2022-07-22 DIAGNOSIS — E1159 Type 2 diabetes mellitus with other circulatory complications: Secondary | ICD-10-CM | POA: Diagnosis not present

## 2022-07-22 DIAGNOSIS — I5022 Chronic systolic (congestive) heart failure: Secondary | ICD-10-CM | POA: Diagnosis present

## 2022-07-22 DIAGNOSIS — Z833 Family history of diabetes mellitus: Secondary | ICD-10-CM

## 2022-07-22 DIAGNOSIS — D649 Anemia, unspecified: Secondary | ICD-10-CM

## 2022-07-22 DIAGNOSIS — E1169 Type 2 diabetes mellitus with other specified complication: Secondary | ICD-10-CM

## 2022-07-22 DIAGNOSIS — N182 Chronic kidney disease, stage 2 (mild): Secondary | ICD-10-CM | POA: Diagnosis present

## 2022-07-22 DIAGNOSIS — Z8249 Family history of ischemic heart disease and other diseases of the circulatory system: Secondary | ICD-10-CM

## 2022-07-22 DIAGNOSIS — Z7982 Long term (current) use of aspirin: Secondary | ICD-10-CM

## 2022-07-22 DIAGNOSIS — Z886 Allergy status to analgesic agent status: Secondary | ICD-10-CM

## 2022-07-22 DIAGNOSIS — F419 Anxiety disorder, unspecified: Secondary | ICD-10-CM | POA: Diagnosis present

## 2022-07-22 DIAGNOSIS — Z8719 Personal history of other diseases of the digestive system: Secondary | ICD-10-CM

## 2022-07-22 DIAGNOSIS — I442 Atrioventricular block, complete: Secondary | ICD-10-CM | POA: Diagnosis present

## 2022-07-22 DIAGNOSIS — I480 Paroxysmal atrial fibrillation: Secondary | ICD-10-CM | POA: Diagnosis present

## 2022-07-22 DIAGNOSIS — R791 Abnormal coagulation profile: Secondary | ICD-10-CM | POA: Diagnosis present

## 2022-07-22 DIAGNOSIS — Z885 Allergy status to narcotic agent status: Secondary | ICD-10-CM

## 2022-07-22 DIAGNOSIS — Z813 Family history of other psychoactive substance abuse and dependence: Secondary | ICD-10-CM

## 2022-07-22 DIAGNOSIS — K219 Gastro-esophageal reflux disease without esophagitis: Secondary | ICD-10-CM | POA: Diagnosis present

## 2022-07-22 DIAGNOSIS — E119 Type 2 diabetes mellitus without complications: Secondary | ICD-10-CM

## 2022-07-22 LAB — COMPREHENSIVE METABOLIC PANEL WITH GFR
ALT: 16 U/L (ref 0–44)
AST: 29 U/L (ref 15–41)
Albumin: 3.2 g/dL — ABNORMAL LOW (ref 3.5–5.0)
Alkaline Phosphatase: 54 U/L (ref 38–126)
Anion gap: 11 (ref 5–15)
BUN: 28 mg/dL — ABNORMAL HIGH (ref 8–23)
CO2: 19 mmol/L — ABNORMAL LOW (ref 22–32)
Calcium: 7.9 mg/dL — ABNORMAL LOW (ref 8.9–10.3)
Chloride: 105 mmol/L (ref 98–111)
Creatinine, Ser: 2.33 mg/dL — ABNORMAL HIGH (ref 0.44–1.00)
GFR, Estimated: 23 mL/min — ABNORMAL LOW
Glucose, Bld: 191 mg/dL — ABNORMAL HIGH (ref 70–99)
Potassium: 6.7 mmol/L (ref 3.5–5.1)
Sodium: 135 mmol/L (ref 135–145)
Total Bilirubin: 0.7 mg/dL (ref 0.3–1.2)
Total Protein: 6.1 g/dL — ABNORMAL LOW (ref 6.5–8.1)

## 2022-07-22 LAB — I-STAT CHEM 8, ED
BUN: 28 mg/dL — ABNORMAL HIGH (ref 8–23)
Calcium, Ion: 0.99 mmol/L — ABNORMAL LOW (ref 1.15–1.40)
Chloride: 106 mmol/L (ref 98–111)
Creatinine, Ser: 2.4 mg/dL — ABNORMAL HIGH (ref 0.44–1.00)
Glucose, Bld: 179 mg/dL — ABNORMAL HIGH (ref 70–99)
HCT: 31 % — ABNORMAL LOW (ref 36.0–46.0)
Hemoglobin: 10.5 g/dL — ABNORMAL LOW (ref 12.0–15.0)
Potassium: 6.5 mmol/L (ref 3.5–5.1)
Sodium: 135 mmol/L (ref 135–145)
TCO2: 19 mmol/L — ABNORMAL LOW (ref 22–32)

## 2022-07-22 LAB — CBC WITH DIFFERENTIAL/PLATELET
Abs Immature Granulocytes: 0.37 K/uL — ABNORMAL HIGH (ref 0.00–0.07)
Basophils Absolute: 0.1 K/uL (ref 0.0–0.1)
Basophils Relative: 1 %
Eosinophils Absolute: 0.1 K/uL (ref 0.0–0.5)
Eosinophils Relative: 1 %
HCT: 29.3 % — ABNORMAL LOW (ref 36.0–46.0)
Hemoglobin: 9 g/dL — ABNORMAL LOW (ref 12.0–15.0)
Immature Granulocytes: 4 %
Lymphocytes Relative: 26 %
Lymphs Abs: 2.6 K/uL (ref 0.7–4.0)
MCH: 27.7 pg (ref 26.0–34.0)
MCHC: 30.7 g/dL (ref 30.0–36.0)
MCV: 90.2 fL (ref 80.0–100.0)
Monocytes Absolute: 0.7 K/uL (ref 0.1–1.0)
Monocytes Relative: 7 %
Neutro Abs: 6.4 K/uL (ref 1.7–7.7)
Neutrophils Relative %: 61 %
Platelets: 190 K/uL (ref 150–400)
RBC: 3.25 MIL/uL — ABNORMAL LOW (ref 3.87–5.11)
RDW: 16.9 % — ABNORMAL HIGH (ref 11.5–15.5)
WBC: 10.2 K/uL (ref 4.0–10.5)
nRBC: 0 % (ref 0.0–0.2)

## 2022-07-22 LAB — CBG MONITORING, ED
Glucose-Capillary: 163 mg/dL — ABNORMAL HIGH (ref 70–99)
Glucose-Capillary: 86 mg/dL (ref 70–99)

## 2022-07-22 LAB — PROTIME-INR
INR: 3.4 — ABNORMAL HIGH (ref 0.8–1.2)
Prothrombin Time: 34.3 s — ABNORMAL HIGH (ref 11.4–15.2)

## 2022-07-22 LAB — LACTIC ACID, PLASMA: Lactic Acid, Venous: 1.7 mmol/L (ref 0.5–1.9)

## 2022-07-22 LAB — TROPONIN I (HIGH SENSITIVITY)
Troponin I (High Sensitivity): 11 ng/L (ref <18)
Troponin I (High Sensitivity): 9 ng/L

## 2022-07-22 LAB — POTASSIUM: Potassium: 5.7 mmol/L — ABNORMAL HIGH (ref 3.5–5.1)

## 2022-07-22 MED ORDER — WARFARIN - PHARMACIST DOSING INPATIENT
Freq: Every day | Status: DC
Start: 2022-07-23 — End: 2022-07-24

## 2022-07-22 MED ORDER — INSULIN ASPART 100 UNIT/ML IJ SOLN
0.0000 [IU] | Freq: Three times a day (TID) | INTRAMUSCULAR | Status: DC
  Administered 2022-07-23: 1 [IU] via SUBCUTANEOUS

## 2022-07-22 MED ORDER — INSULIN ASPART 100 UNIT/ML IV SOLN
5.0000 [IU] | Freq: Once | INTRAVENOUS | Status: AC
  Administered 2022-07-22: 5 [IU] via INTRAVENOUS

## 2022-07-22 MED ORDER — SODIUM CHLORIDE 0.9 % IV BOLUS
1000.0000 mL | Freq: Once | INTRAVENOUS | Status: AC
  Administered 2022-07-22: 1000 mL via INTRAVENOUS

## 2022-07-22 MED ORDER — ATORVASTATIN CALCIUM 10 MG PO TABS
20.0000 mg | ORAL_TABLET | Freq: Every day | ORAL | Status: DC
  Administered 2022-07-23 – 2022-07-24 (×2): 20 mg via ORAL
  Filled 2022-07-22 (×2): qty 2

## 2022-07-22 MED ORDER — ACETAMINOPHEN 650 MG RE SUPP: 650.0000 mg | Freq: Four times a day (QID) | RECTAL | Status: DC | PRN

## 2022-07-22 MED ORDER — CALCIUM CARBONATE 1250 (500 CA) MG PO TABS
1.0000 | ORAL_TABLET | Freq: Two times a day (BID) | ORAL | Status: DC
Start: 2022-07-23 — End: 2022-07-24
  Administered 2022-07-23 – 2022-07-24 (×3): 1250 mg via ORAL
  Filled 2022-07-22 (×3): qty 1

## 2022-07-22 MED ORDER — ACETAMINOPHEN 325 MG PO TABS: 650.0000 mg | ORAL_TABLET | Freq: Four times a day (QID) | ORAL | Status: DC | PRN

## 2022-07-22 MED ORDER — SODIUM ZIRCONIUM CYCLOSILICATE 10 G PO PACK
10.0000 g | PACK | Freq: Once | ORAL | Status: AC
Start: 2022-07-22 — End: 2022-07-23
  Administered 2022-07-23: 10 g via ORAL
  Filled 2022-07-22: qty 1

## 2022-07-22 MED ORDER — NALOXONE HCL 4 MG/0.1ML NA LIQD
1.0000 | NASAL | Status: DC | PRN
Start: 2022-07-22 — End: 2022-07-24

## 2022-07-22 MED ORDER — ACETAMINOPHEN 500 MG PO TABS: 1000.0000 mg | ORAL_TABLET | Freq: Four times a day (QID) | ORAL | Status: DC | PRN

## 2022-07-22 MED ORDER — SODIUM CHLORIDE 0.9 % IV SOLN: 250.0000 mL | INTRAVENOUS | Status: DC | PRN

## 2022-07-22 MED ORDER — SODIUM CHLORIDE 0.9 % IV SOLN: INTRAVENOUS | Status: DC

## 2022-07-22 MED ORDER — FUROSEMIDE 10 MG/ML IJ SOLN
40.0000 mg | Freq: Once | INTRAMUSCULAR | Status: AC
  Administered 2022-07-22: 40 mg via INTRAVENOUS
  Filled 2022-07-22: qty 4

## 2022-07-22 MED ORDER — DEXTROSE 50 % IV SOLN
1.0000 | Freq: Once | INTRAVENOUS | Status: AC
  Administered 2022-07-22: 50 mL via INTRAVENOUS
  Filled 2022-07-22: qty 50

## 2022-07-22 MED ORDER — POLYETHYLENE GLYCOL 3350 17 G PO PACK: 17.0000 g | PACK | Freq: Every day | ORAL | Status: DC | PRN

## 2022-07-22 MED ORDER — OXYCODONE HCL 5 MG PO TABS
7.5000 mg | ORAL_TABLET | Freq: Three times a day (TID) | ORAL | Status: DC | PRN
  Administered 2022-07-23 – 2022-07-24 (×5): 7.5 mg via ORAL
  Filled 2022-07-22 (×5): qty 2

## 2022-07-22 MED ORDER — ATROPINE SULFATE 1 MG/10ML IJ SOSY: 0.5000 mg | PREFILLED_SYRINGE | INTRAMUSCULAR | Status: DC | PRN

## 2022-07-22 MED ORDER — ALBUTEROL SULFATE (2.5 MG/3ML) 0.083% IN NEBU: 2.5000 mg | INHALATION_SOLUTION | Freq: Four times a day (QID) | RESPIRATORY_TRACT | Status: DC | PRN

## 2022-07-22 MED ORDER — ASPIRIN 81 MG PO TBEC
81.0000 mg | DELAYED_RELEASE_TABLET | Freq: Every day | ORAL | Status: DC
  Administered 2022-07-23 – 2022-07-24 (×2): 81 mg via ORAL
  Filled 2022-07-22 (×2): qty 1

## 2022-07-22 MED ORDER — GABAPENTIN 300 MG PO CAPS
300.0000 mg | ORAL_CAPSULE | Freq: Three times a day (TID) | ORAL | Status: DC
  Administered 2022-07-23 – 2022-07-24 (×6): 300 mg via ORAL
  Filled 2022-07-22 (×7): qty 1

## 2022-07-22 MED ORDER — EPINEPHRINE HCL 5 MG/250ML IV SOLN IN NS
0.5000 ug/min | INTRAVENOUS | Status: DC
  Administered 2022-07-22: 2 ug/min via INTRAVENOUS

## 2022-07-22 MED ORDER — CALCIUM GLUCONATE 10 % IV SOLN
1.0000 g | Freq: Once | INTRAVENOUS | Status: AC
  Administered 2022-07-22: 1 g via INTRAVENOUS
  Filled 2022-07-22: qty 10

## 2022-07-22 MED ORDER — ONDANSETRON HCL 4 MG PO TABS: 4.0000 mg | ORAL_TABLET | Freq: Four times a day (QID) | ORAL | Status: DC | PRN

## 2022-07-22 MED ORDER — ATROPINE SULFATE 1 MG/10ML IJ SOSY
0.5000 mg | PREFILLED_SYRINGE | Freq: Once | INTRAMUSCULAR | Status: AC
  Administered 2022-07-22: 0.5 mg via INTRAVENOUS

## 2022-07-22 MED ORDER — SODIUM CHLORIDE 0.9% FLUSH: 3.0000 mL | INTRAVENOUS | Status: DC | PRN

## 2022-07-22 MED ORDER — SODIUM CHLORIDE 0.9 % IV BOLUS
500.0000 mL | Freq: Once | INTRAVENOUS | Status: AC
  Administered 2022-07-22: 500 mL via INTRAVENOUS

## 2022-07-22 MED ORDER — ONDANSETRON HCL 4 MG/2ML IJ SOLN: 4.0000 mg | Freq: Four times a day (QID) | INTRAMUSCULAR | Status: DC | PRN

## 2022-07-22 MED ORDER — CALCIUM GLUCONATE-NACL 1-0.675 GM/50ML-% IV SOLN: 1.0000 g | Freq: Once | INTRAVENOUS | Status: DC

## 2022-07-22 MED ORDER — INSULIN ASPART 100 UNIT/ML IJ SOLN: 0.0000 [IU] | Freq: Every day | INTRAMUSCULAR | Status: DC

## 2022-07-22 MED ORDER — SODIUM CHLORIDE 0.9% FLUSH
3.0000 mL | Freq: Two times a day (BID) | INTRAVENOUS | Status: DC
  Administered 2022-07-23 – 2022-07-24 (×4): 3 mL via INTRAVENOUS

## 2022-07-22 MED ORDER — PANTOPRAZOLE SODIUM 40 MG PO TBEC
40.0000 mg | DELAYED_RELEASE_TABLET | Freq: Every day | ORAL | Status: DC
  Administered 2022-07-23 – 2022-07-24 (×2): 40 mg via ORAL
  Filled 2022-07-22 (×2): qty 1

## 2022-07-22 NOTE — ED Notes (Signed)
Pt trial off Epi per MD verbal order. MAP maintains above 65.

## 2022-07-22 NOTE — Progress Notes (Signed)
ANTICOAGULATION CONSULT NOTE - Initial Consult  Pharmacy Consult for warfarin Indication: atrial fibrillation  Allergies  Allergen Reactions   Hydrocodone-Acetaminophen Itching   Hydrocodone Itching    Tolerates oxycodone     Patient Measurements: Height: '5\' 11"'$  (180.3 cm) Weight: 92.5 kg (204 lb) IBW/kg (Calculated) : 70.8  Vital Signs: Temp: 97.4 F (36.3 C) (08/17 1906) Temp Source: Oral (08/17 1906) BP: 111/74 (08/17 2215) Pulse Rate: 57 (08/17 2215)  Labs: Recent Labs    07/22/22 1852 07/22/22 1906 07/22/22 2052  HGB 9.0* 10.5*  --   HCT 29.3* 31.0*  --   PLT 190  --   --   LABPROT 34.3*  --   --   INR 3.4*  --   --   CREATININE 2.33* 2.40*  --   TROPONINIHS 11  --  9    Estimated Creatinine Clearance: 30.5 mL/min (A) (by C-G formula based on SCr of 2.4 mg/dL (H)).   Medical History: Past Medical History:  Diagnosis Date   Acute pericardial effusion 05/17/2022   AKI (acute kidney injury) ([place]) 05/17/2022   Anxiety    Arthritis    Cervicalgia    Chondromalacia of patella    Chronic neck pain    Chronic pain syndrome    Depression    secondary to loss of her son at age 89   Diabetes mellitus    Diabetic neuropathy ([place])    DMII (diabetes mellitus, type 2) ([place])    Dysrhythmia    Enthesopathy of hip region    Fibromyalgia    GERD (gastroesophageal reflux disease)    Hep C w/o coma, chronic ([place])    Hepatitis C    diagnosed 2005   Hypertension    Insomnia    Insomnia    Low back pain    Lumbago    Mitral regurgitation    Obesity    Pain in joint, upper arm    Primary localized osteoarthrosis, lower leg    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   Substance abuse ([place])    sober since 2001   Tobacco abuse    Tricuspid regurgitation    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

## 2022-07-22 NOTE — ED Provider Notes (Signed)
[facility] EMERGENCY DEPARTMENT Provider Note  CSN: [id number] Arrival date & time: 07/22/22 1842  Chief Complaint(s) Bradycardia  HPI Cleola Perryman is a 61 y.o. female with history of diabetes, hypertension, mitral valve and tricuspid valve repair on warfarin, atrial flutter presenting to the emergency department with dizziness.  Patient reports around 3 PM she developed dizziness, described as a lightheadedness.  She reports that this was severe.  She also reports generalized weakness.  She reports that recently she was urinating less, sometimes only once per day.  She reports that she has been drinking more water.  She denies any chest pain, abdominal pain, nausea, vomiting.  Reports symptoms are constant.  Denies similar episodes in the past.  On EMS arrival, patient was hypotensive to the 50s, bradycardic to 25.  They gave patient atropine with some improvement and started her on an epinephrine drip.   Past Medical History Past Medical History:  Diagnosis Date  . Acute pericardial effusion 05/17/2022  . AKI (acute kidney injury) ([place]) 05/17/2022  . Anxiety   . Arthritis   . Cervicalgia   . Chondromalacia of patella   . Chronic neck pain   . Chronic pain syndrome   . Depression    secondary to loss of her son at age 23  . Diabetes mellitus   . Diabetic neuropathy ([place])   . DMII (diabetes mellitus, type 2) ([place])   . Dysrhythmia   . Enthesopathy of hip region   . Fibromyalgia   . GERD (gastroesophageal reflux disease)   . Hep C w/o coma, chronic ([place])   . Hepatitis C    diagnosed 2005  . Hypertension   . Insomnia   . Insomnia   . Low back pain   . Lumbago   . Mitral regurgitation   . Obesity   . Pain in joint, upper arm   . Primary localized osteoarthrosis, lower leg   . 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 Tobacco abuse   . Tricuspid regurgitation   . 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 Bradycardia 07/22/2022  . Pain due to onychomycosis of toenails of both feet 06/23/2022  . Encounter for therapeutic drug monitoring 06/11/2022  . Atrial fibrillation ([place]) 06/11/2022  . Chronic heart failure with preserved ejection fraction (HFpEF) ([place]) 05/29/2022  . Acute encephalopathy 05/29/2022  . Hypomagnesemia 05/24/2022  . Acute pericardial effusion s/p pericardiocentesis 05/10/22 05/17/2022  . Anticoagulated 05/17/2022  . 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 05/17/2022  . Acute renal failure superimposed on stage 2 chronic kidney disease ([place]) 05/17/2022  . PAF (paroxysmal atrial fibrillation) ([place])   . Atrial flutter with rapid ventricular response ([place])   . Pericardial tamponade 05/10/2022  . Hypotension 04/05/2022  . Severe tricuspid regurgitation 04/05/2022  . Severe mitral regurgitation 02/11/2022  . DOE (dyspnea on exertion) 12/30/2021  . Pain of left hand 01/01/2021  . Nasal vestibulitis 04/25/2020  . FH: CHF (congestive heart failure) 04/05/2020  . Acquired hallux interphalangeus 02/26/2020  . Hallux rigidus 02/26/2020  . Retained orthopedic hardware 02/26/2020  . Mechanical complication associated with orthopedic device ([place]) 08/31/2019  . Hypercholesterolemia 08/17/2019  . Lateral epicondylitis of right elbow 03/31/2018  . Arthritis of knee 03/09/2018  . Arthritis of foot, degenerative 02/28/2018  . Hallux valgus (acquired), left foot  02/28/2018  . Long term current use of oral hypoglycemic drug 01/16/2018  . Pharyngeal dysphagia 12/21/2017  . Left thyroid nodule 12/21/2017  . History of colon polyps 09/21/2017  . History of atrial fibrillation 08/23/2017  . GERD (gastroesophageal reflux disease) 08/23/2017  . Presbyopia of both eyes 07/15/2017  . Nuclear sclerotic cataract of both eyes 07/15/2017  . Dry eye syndrome of both  lacrimal glands 07/15/2017  . Acquired trigger finger of left ring finger 05/04/2017  . 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 Postural dizziness with presyncope 12/30/2016  . Paroxysmal atrial fibrillation ([place]) 12/30/2016  . Osteoarthritis 12/30/2016  . NSAID long-term use 12/30/2016  . Anemia, blood loss 12/30/2016  . DDD (degenerative disc disease), lumbar 11/26/2015  . Facet syndrome, lumbar 11/26/2015  . Greater trochanteric bursitis 11/26/2015  . Diabetic neuropathy ([place]) 11/26/2015  . Insomnia 11/11/2015  . PTSD (post-traumatic stress disorder) 11/11/2015  . GAD (generalized anxiety disorder) 11/11/2015  . Severe episode of recurrent major depressive disorder, without psychotic features ([place]) 11/11/2015  . Depression 11/11/2015  . Obesity 06/27/2012  . Tobacco use 06/27/2012  . Sacroiliac joint dysfunction 05/18/2012  . Right low back pain 04/19/2012  . Cervical neck pain with evidence of disc disease 02/11/2012  . Trochanteric bursitis of right hip 02/11/2012  . Plantar fasciitis 03/11/2011  . INSOMNIA 01/18/2008  . Chronic pain syndrome 12/15/2007  . DIABETIC PERIPHERAL NEUROPATHY 05/08/2007  . HEPATITIS C 02/27/2007  . Type 2 diabetes mellitus without complication, without long-term current use of insulin ([place]) 02/27/2007  . DEPRESSION 02/27/2007  . Hypertension associated with diabetes ([place]) 02/27/2007   Home Medication(s) Prior to Admission medications   Medication Sig Start Date End Date Taking? Authorizing Provider  ACCU-CHEK FASTCLIX LANCETS MISC 1 each by Does not apply route daily. Check blood sugar once daily. 250.00 10/26/11   [name], DO  acetaminophen (TYLENOL) 500 MG tablet Take 1,000 mg by mouth every 6 (six) hours as needed for mild pain.    [provider]  albuterol (PROVENTIL HFA;VENTOLIN HFA) 108 (90 Base) MCG/ACT inhaler Inhale 2 puffs into the lungs every 6 (six) hours as needed for wheezing or shortness of  breath.     [provider]  ARIPiprazole (ABILIFY) 5 MG tablet Take 5 mg by mouth daily. 07/20/22   [provider]  aspirin 81 MG EC tablet Take 81 mg by mouth daily.    [provider]  atorvastatin (LIPITOR) 20 MG tablet Take 20 mg by mouth daily.    [provider]  blood glucose meter kit and supplies KIT Dispense based on patient and insurance preference. Use up to four times daily as directed. 05/26/22   [name], MD  buPROPion (WELLBUTRIN XL) 300 MG 24 hr tablet Take 300 mg by mouth in the morning. 03/24/22   [provider]  calcium carbonate (OSCAL) 1500 (600 Ca) MG TABS tablet Take 600 mg of elemental calcium by mouth daily with breakfast.    [provider]  Cyanocobalamin (VITAMIN B 12 PO) Take 500 mcg by mouth daily.    [provider]  diclofenac Sodium (VOLTAREN) 1 % GEL Apply 2-4 g topically 4 (four) times daily as needed (joint pain). 05/10/22   [provider]  diltiazem (CARDIZEM CD) 360 MG 24 hr capsule Take 1 capsule (360 mg total) by mouth daily. 05/27/22   [name], MD  escitalopram (LEXAPRO) 10 MG tablet Take 10 mg by mouth every morning. 07/08/22  [provider]  ferrous sulfate 325 (65 FE) MG tablet Take 325 mg by mouth daily with breakfast.    [provider]  FLUoxetine (PROZAC) 20 MG capsule Take 20 mg by mouth every morning. 04/14/22   [provider]  furosemide (LASIX) 20 MG tablet Take 1 tablet (20 mg total) by mouth daily. 05/27/22   [name], MD  gabapentin (NEURONTIN) 600 MG tablet Take 0.5 tablets (300 mg total) by mouth 3 (three) times daily. 05/30/22   [name], [name], MD  glucose blood (ACCU-CHEK SMARTVIEW) test strip Check blood sugar once daily. 250.00 10/26/11 12/30/21  [name], DO  hydrOXYzine (ATARAX) 25 MG tablet Take 25 mg by mouth every 4 (four) hours as needed for anxiety.    [provider]  Lifitegrast [facility]) Place  1 drop into both eyes daily.    [provider]  linagliptin (TRADJENTA) 5 MG TABS tablet Take 5 mg by mouth in the morning.    [provider]  losartan (COZAAR) 100 MG tablet Take 100 mg by mouth daily. 07/06/22   [provider]  methocarbamol (ROBAXIN) 500 MG tablet Take 1 tablet (500 mg total) by mouth every 8 (eight) hours as needed for muscle spasms. 05/26/22   [name], MD  metoprolol tartrate (LOPRESSOR) 50 MG tablet Take 1 tablet (50 mg total) by mouth 2 (two) times daily. 05/17/22   [name], NP  naloxone [facility]) nasal spray 4 mg/0.1 mL Place 1 spray into the nose as needed (accidental overdose).    [provider]  nystatin ointment (MYCOSTATIN) Apply 1 Application topically 2 (two) times daily as needed (irriation). 04/05/22   [provider]  omeprazole (PRILOSEC) 10 MG capsule Take 10 mg by mouth daily. 07/02/22   [provider]  omeprazole (PRILOSEC) 20 MG capsule Take 20 mg by mouth daily.    [provider]  oxyCODONE (ROXICODONE) 15 MG immediate release tablet Take 0.5 tablets (7.5 mg total) by mouth every 8 (eight) hours as needed for pain. 05/30/22   [name], [name], MD  PREMARIN 0.45 MG tablet Take 0.45 mg by mouth daily. 01/11/22   [provider]  TIADYLT ER 300 MG 24 hr capsule Take 300 mg by mouth daily. 07/14/22   [provider]  traZODone (DESYREL) 100 MG tablet Take 2.5 tablets (250 mg total) by mouth at bedtime as needed for sleep. Patient taking differently: Take 200 mg by mouth at bedtime as needed for sleep. 11/23/18   [name], MD  warfarin (COUMADIN) 5 MG tablet TAKE 1 TABLET BY MOUTH DAILY OR AS DIRECTED BY ANTICOAGULATION CLINIC. Patient taking differently: Take 5 mg by mouth daily. Take 1 tablet by mouth daily or as directed by Anticoagulation Clinic. 07/02/22   Werner Lean, MD  Past Surgical History Past Surgical History:  Procedure Laterality Date  . ABDOMINAL HYSTERECTOMY     at age 72, unknown reasons  . CARPAL TUNNEL RELEASE  12/07/2011   left side  . EYE SURGERY    . FOOT SURGERY Bilateral   . MITRAL VALVE REPAIR N/A 04/19/2022   Procedure: MITRAL VALVE REPAIR WITH 30MM PHYSIO II ANNULOPLASTY RING;  Surgeon: Melrose Nakayama, MD;  Location: [facility];  Service: Open Heart Surgery;  Laterality: N/A;  . PERICARDIOCENTESIS N/A 05/10/2022   Procedure: PERICARDIOCENTESIS;  Surgeon: Jettie Booze, MD;  Location: [facility] CV LAB;  Service: Cardiovascular;  Laterality: N/A;  . RIGHT/LEFT HEART CATH AND CORONARY ANGIOGRAPHY N/A 02/12/2022   Procedure: RIGHT/LEFT HEART CATH AND CORONARY ANGIOGRAPHY;  Surgeon: Belva Crome, MD;  Location: [facility];  Service: Cardiovascular;  Laterality: N/A;  . TEE WITHOUT CARDIOVERSION N/A 02/03/2022   Procedure: TRANSESOPHAGEAL ECHOCARDIOGRAM (TEE);  Surgeon: Josue Hector, MD;  Location: [facility] ENDOSCOPY;  Service: Cardiovascular;  Laterality: N/A;  . TEE WITHOUT CARDIOVERSION N/A 04/19/2022   Procedure: TRANSESOPHAGEAL ECHOCARDIOGRAM (TEE);  Surgeon: Melrose Nakayama, MD;  Location: [facility];  Service: Open Heart Surgery;  Laterality: N/A;  . TRICUSPID VALVE REPLACEMENT N/A 04/19/2022   Procedure: TRICUSPID VALVE REPAIR WITH 32MM MC3 ANNULOPLASTY RING;  Surgeon: Melrose Nakayama, MD;  Location: [facility];  Service: Open Heart Surgery;  Laterality: N/A;   Family History Family History  Problem Relation Age of Onset  . Uterine cancer Mother   . Alcohol abuse Father   . Alcohol abuse Sister   . Drug abuse Sister   . Drug abuse Brother   . Alcohol abuse Brother   . Schizophrenia Son   . Diabetes Other   . Arthritis Other   . Hypertension Other   . Heart attack Son 66       Died suddenly    Social History Social History   Tobacco Use  . Smoking status:  Every Day    Packs/day: 0.25    Years: 35.00    Total pack years: 8.75    Types: Cigarettes  . Smokeless tobacco: Never  . Tobacco comments:    4 cigarettes a day  Vaping Use  . Vaping Use: Some days  Substance Use Topics  . Alcohol use: No  . Drug use: No   Allergies Hydrocodone-acetaminophen and Hydrocodone  Review of Systems Review of Systems  All other systems reviewed and are negative.   Physical Exam Vital Signs  I have reviewed the triage vital signs BP 111/74   Pulse (!) 57   Temp (!) 97.4 F (36.3 C) (Oral)   Resp 14   Ht 5' 11"  (1.803 m)   Wt 92.5 kg   LMP 12/06/1976   SpO2 92%   BMI 28.45 kg/m  Physical Exam Vitals and nursing note reviewed.  Constitutional:      General: She is in acute distress.     Appearance: She is well-developed.     Comments: Mildly somnolent  HENT:     Head: Normocephalic and atraumatic.     Mouth/Throat:     Mouth: Mucous membranes are moist.  Eyes:     Pupils: Pupils are equal, round, and reactive to light.  Cardiovascular:     Rate and Rhythm: Regular rhythm. Bradycardia present.     Heart sounds: No murmur heard. Pulmonary:     Effort: Pulmonary effort is normal. No respiratory distress.     Breath sounds:  Normal breath sounds.  Abdominal:     General: Abdomen is flat.     Palpations: Abdomen is soft.     Tenderness: There is no abdominal tenderness.  Musculoskeletal:        General: No tenderness.     Right lower leg: No edema.     Left lower leg: No edema.  Skin:    General: Skin is warm and dry.  Neurological:     General: No focal deficit present.     Mental Status: Mental status is at baseline.  Psychiatric:        Mood and Affect: Mood normal.        Behavior: Behavior normal.     ED Results and Treatments Labs (all labs ordered are listed, but only abnormal results are displayed) Labs Reviewed  COMPREHENSIVE METABOLIC PANEL - Abnormal; Notable for the following components:      Result Value    Potassium 6.7 (*)    CO2 19 (*)    Glucose, Bld 191 (*)    BUN 28 (*)    Creatinine, Ser 2.33 (*)    Calcium 7.9 (*)    Total Protein 6.1 (*)    Albumin 3.2 (*)    GFR, Estimated 23 (*)    All other components within normal limits  CBC WITH DIFFERENTIAL/PLATELET - Abnormal; Notable for the following components:   RBC 3.25 (*)    Hemoglobin 9.0 (*)    HCT 29.3 (*)    RDW 16.9 (*)    Abs Immature Granulocytes 0.37 (*)    All other components within normal limits  PROTIME-INR - Abnormal; Notable for the following components:   Prothrombin Time 34.3 (*)    INR 3.4 (*)    All other components within normal limits  POTASSIUM - Abnormal; Notable for the following components:   Potassium 5.7 (*)    All other components within normal limits  I-STAT CHEM 8, ED - Abnormal; Notable for the following components:   Potassium 6.5 (*)    BUN 28 (*)    Creatinine, Ser 2.40 (*)    Glucose, Bld 179 (*)    Calcium, Ion 0.99 (*)    TCO2 19 (*)    Hemoglobin 10.5 (*)    HCT 31.0 (*)    All other components within normal limits  CBG MONITORING, ED - Abnormal; Notable for the following components:   Glucose-Capillary 163 (*)    All other components within normal limits  LACTIC ACID, PLASMA  LACTIC ACID, PLASMA  URINALYSIS, ROUTINE W REFLEX MICROSCOPIC  TSH  HEMOGLOBIN A1C  COMPREHENSIVE METABOLIC PANEL  CBC  PROTIME-INR  MAGNESIUM  TROPONIN I (HIGH SENSITIVITY)  TROPONIN I (HIGH SENSITIVITY)                                                                                                                          Radiology DG Chest Port 1 View  Result Date: 07/22/2022 CLINICAL DATA:  Back pain  and bradycardia EXAM: PORTABLE CHEST 1 VIEW COMPARISON:  05/28/2022 FINDINGS: Cardiac shadow is within normal limits. Postsurgical changes are again seen. Lungs are well aerated bilaterally. No acute bony abnormality is seen. IMPRESSION: No active disease. Electronically Signed   By: Inez Catalina M.D.    On: 07/22/2022 19:38    Pertinent labs & imaging results that were available during my care of the patient were reviewed by me and considered in my medical decision making (see MDM for details).  Medications Ordered in ED Medications  atropine 1 MG/10ML injection 0.5 mg (has no administration in time range)  aspirin EC tablet 81 mg (has no administration in time range)  oxyCODONE (Oxy IR/ROXICODONE) immediate release tablet 7.5 mg (has no administration in time range)  atorvastatin (LIPITOR) tablet 20 mg (has no administration in time range)  pantoprazole (PROTONIX) EC tablet 40 mg (has no administration in time range)  naloxone (NARCAN) nasal spray 4 mg/0.1 mL (has no administration in time range)  gabapentin (NEURONTIN) capsule 300 mg (has no administration in time range)  albuterol (PROVENTIL) (2.5 MG/3ML) 0.083% nebulizer solution 2.5 mg (has no administration in time range)  sodium chloride flush (NS) 0.9 % injection 3 mL (has no administration in time range)  sodium chloride flush (NS) 0.9 % injection 3 mL (has no administration in time range)  0.9 %  sodium chloride infusion (has no administration in time range)  0.9 %  sodium chloride infusion (has no administration in time range)  acetaminophen (TYLENOL) tablet 650 mg (has no administration in time range)    Or  acetaminophen (TYLENOL) suppository 650 mg (has no administration in time range)  polyethylene glycol (MIRALAX / GLYCOLAX) packet 17 g (has no administration in time range)  ondansetron (ZOFRAN) tablet 4 mg (has no administration in time range)    Or  ondansetron (ZOFRAN) injection 4 mg (has no administration in time range)  sodium zirconium cyclosilicate (LOKELMA) packet 10 g (has no administration in time range)  calcium carbonate (OS-CAL - dosed in mg of elemental calcium) tablet 1,250 mg (has no administration in time range)  insulin aspart (novoLOG) injection 0-9 Units (has no administration in time range)  insulin  aspart (novoLOG) injection 0-5 Units (has no administration in time range)  atropine 1 MG/10ML injection 0.5 mg (0.5 mg Intravenous Given 07/22/22 1849)  sodium chloride 0.9 % bolus 1,000 mL (0 mLs Intravenous Stopped 07/22/22 2030)  furosemide (LASIX) injection 40 mg (40 mg Intravenous Given 07/22/22 1923)  sodium chloride 0.9 % bolus 500 mL (0 mLs Intravenous Stopped 07/22/22 2029)  calcium gluconate inj 10% (1 g) URGENT USE ONLY! (1 g Intravenous Given 07/22/22 1923)  insulin aspart (novoLOG) injection 5 Units (5 Units Intravenous Given 07/22/22 1924)    And  dextrose 50 % solution 50 mL (50 mLs Intravenous Given 07/22/22 1923)  calcium gluconate inj 10% (1 g) URGENT USE ONLY! (1 g Intravenous Given 07/22/22 2102)  Procedures .Critical Care  Performed by: Cristie Hem, MD Authorized by: Cristie Hem, MD   Critical care provider statement:    Critical care time (minutes):  30   Critical care was necessary to treat or prevent imminent or life-threatening deterioration of the following conditions:  Cardiac failure, circulatory failure and shock   Critical care was time spent personally by me on the following activities:  Development of treatment plan with patient or surrogate, discussions with consultants, evaluation of patient's response to treatment, examination of patient, ordering and review of laboratory studies, ordering and review of radiographic studies, ordering and performing treatments and interventions, pulse oximetry, re-evaluation of patient's condition and review of old charts   Care discussed with: admitting provider   .1-3 Lead EKG Interpretation  Performed by: Cristie Hem, MD Authorized by: Cristie Hem, MD     Interpretation: abnormal     ECG rate assessment: bradycardic     Rhythm: A-V block     Ectopy: none      Conduction: abnormal     Abnormal conduction: 3rd degree AV block     (including critical care time)  Medical Decision Making / ED Course   MDM:  61 year old female presenting to the emergency department with dizziness and lightheadedness.  Exam notable for significant bradycardia.  EKG with junctional rhythm.  Differential includes complete heart block, electrolyte abnormality.  I-STAT with potassium of 6.5 which could explain patient's bradycardia.  EKG without QRS widening otherwise to suggest hyperkalemia as cause, but will treat empirically with calcium, insulin, glucose, Lasix.  Discussed with cardiology, given low blood pressure, they agree with epinephrine for now, will review medical history further to determine whether patient would benefit from temporary transvenous pacing.  Lower concern for ACS, no chest pain, EKG without acute ST or T wave changes concerning for ischemia but will check troponin.  Less concern for sepsis as cause of low blood pressure with no fever, no infectious symptoms.  No symptoms concerning for hemorrhagic shock.  Clinical Course as of 07/22/22 2251  Thu Jul 22, 2022  2251 Discussed with ICU, given off epinephrine, recommend repeat potassium.  Repeat potassium 5.7.  Discussed with hospitalist, who admit the patient.  Patient feels much better.  Unclear cause of AKI, giving IV fluids.  Patient admitted [WS]    Clinical Course User Index [WS] Cristie Hem, MD     Additional history obtained: -Additional history obtained from EMS -External records from outside source obtained and reviewed including: Chart review including previous notes, labs, imaging, consultation notes   Lab Tests: -I ordered, reviewed, and interpreted labs.   The pertinent results include:   Labs Reviewed  COMPREHENSIVE METABOLIC PANEL - Abnormal; Notable for the following components:      Result Value   Potassium 6.7 (*)    CO2 19 (*)    Glucose, Bld 191 (*)    BUN  28 (*)    Creatinine, Ser 2.33 (*)    Calcium 7.9 (*)    Total Protein 6.1 (*)    Albumin 3.2 (*)    GFR, Estimated 23 (*)    All other components within normal limits  CBC WITH DIFFERENTIAL/PLATELET - Abnormal; Notable for the following components:   RBC 3.25 (*)    Hemoglobin 9.0 (*)    HCT 29.3 (*)    RDW 16.9 (*)    Abs Immature Granulocytes 0.37 (*)    All other components within normal limits  PROTIME-INR - Abnormal;  Notable for the following components:   Prothrombin Time 34.3 (*)    INR 3.4 (*)    All other components within normal limits  POTASSIUM - Abnormal; Notable for the following components:   Potassium 5.7 (*)    All other components within normal limits  I-STAT CHEM 8, ED - Abnormal; Notable for the following components:   Potassium 6.5 (*)    BUN 28 (*)    Creatinine, Ser 2.40 (*)    Glucose, Bld 179 (*)    Calcium, Ion 0.99 (*)    TCO2 19 (*)    Hemoglobin 10.5 (*)    HCT 31.0 (*)    All other components within normal limits  CBG MONITORING, ED - Abnormal; Notable for the following components:   Glucose-Capillary 163 (*)    All other components within normal limits  LACTIC ACID, PLASMA  LACTIC ACID, PLASMA  URINALYSIS, ROUTINE W REFLEX MICROSCOPIC  TSH  HEMOGLOBIN A1C  COMPREHENSIVE METABOLIC PANEL  CBC  PROTIME-INR  MAGNESIUM  TROPONIN I (HIGH SENSITIVITY)  TROPONIN I (HIGH SENSITIVITY)      EKG   EKG Interpretation  Date/Time:  Thursday July 22 2022 18:48:45 EDT Ventricular Rate:  50 PR Interval:    QRS Duration: 110 QT Interval:  518 QTC Calculation: 473 R Axis:   35 Text Interpretation: Junctional rhythm Low voltage, precordial leads Nonspecific T abnrm, anterolateral leads Confirmed by Garnette Gunner [phone]) on 07/22/2022 6:54:05 PM         Imaging Studies ordered: I ordered imaging studies including x-ray I independently visualized and interpreted imaging. I agree with the radiologist interpretation   Medicines  ordered and prescription drug management: Meds ordered this encounter  Medications  . atropine 1 MG/10ML injection 0.5 mg  . sodium chloride 0.9 % bolus 1,000 mL  . DISCONTD: EPINEPHrine (ADRENALIN) 5 mg in NS 250 mL (0.02 mg/mL) premix infusion  . DISCONTD: calcium gluconate 1 g/ 50 mL sodium chloride IVPB  . furosemide (LASIX) injection 40 mg  . sodium chloride 0.9 % bolus 500 mL  . calcium gluconate inj 10% (1 g) URGENT USE ONLY!  . AND Linked Order Group   . insulin aspart (novoLOG) injection 5 Units   . dextrose 50 % solution 50 mL  . calcium gluconate inj 10% (1 g) URGENT USE ONLY!  Marland Kitchen atropine 1 MG/10ML injection 0.5 mg  . DISCONTD: acetaminophen (TYLENOL) tablet 1,000 mg  . aspirin EC tablet 81 mg  . oxyCODONE (Oxy IR/ROXICODONE) immediate release tablet 7.5 mg  . atorvastatin (LIPITOR) tablet 20 mg  . pantoprazole (PROTONIX) EC tablet 40 mg  . naloxone (NARCAN) nasal spray 4 mg/0.1 mL  . gabapentin (NEURONTIN) capsule 300 mg  . albuterol (PROVENTIL) (2.5 MG/3ML) 0.083% nebulizer solution 2.5 mg  . sodium chloride flush (NS) 0.9 % injection 3 mL  . sodium chloride flush (NS) 0.9 % injection 3 mL  . 0.9 %  sodium chloride infusion  . 0.9 %  sodium chloride infusion  . OR Linked Order Group   . acetaminophen (TYLENOL) tablet 650 mg   . acetaminophen (TYLENOL) suppository 650 mg  . polyethylene glycol (MIRALAX / GLYCOLAX) packet 17 g  . OR Linked Order Group   . ondansetron (ZOFRAN) tablet 4 mg   . ondansetron (ZOFRAN) injection 4 mg  . sodium zirconium cyclosilicate (LOKELMA) packet 10 g  . calcium carbonate (OS-CAL - dosed in mg of elemental calcium) tablet 1,250 mg  . insulin aspart (novoLOG) injection 0-9 Units  Order Specific Question:   Correction coverage:    Answer:   Sensitive (thin, NPO, renal)    Order Specific Question:   CBG < 70:    Answer:   Implement Hypoglycemia Standing Orders and refer to Hypoglycemia Standing Orders sidebar report    Order Specific  Question:   CBG 70 - 120:    Answer:   0 units    Order Specific Question:   CBG 121 - 150:    Answer:   1 unit    Order Specific Question:   CBG 151 - 200:    Answer:   2 units    Order Specific Question:   CBG 201 - 250:    Answer:   3 units    Order Specific Question:   CBG 251 - 300:    Answer:   5 units    Order Specific Question:   CBG 301 - 350:    Answer:   7 units    Order Specific Question:   CBG 351 - 400    Answer:   9 units    Order Specific Question:   CBG > 400    Answer:   call MD and obtain STAT lab verification  . insulin aspart (novoLOG) injection 0-5 Units    Order Specific Question:   Correction coverage:    Answer:   HS scale    Order Specific Question:   CBG < 70:    Answer:   Implement Hypoglycemia Standing Orders and refer to Hypoglycemia Standing Orders sidebar report    Order Specific Question:   CBG 70 - 120:    Answer:   0 units    Order Specific Question:   CBG 121 - 150:    Answer:   0 units    Order Specific Question:   CBG 151 - 200:    Answer:   0 units    Order Specific Question:   CBG 201 - 250:    Answer:   2 units    Order Specific Question:   CBG 251 - 300:    Answer:   3 units    Order Specific Question:   CBG 301 - 350:    Answer:   4 units    Order Specific Question:   CBG 351 - 400:    Answer:   5 units    Order Specific Question:   CBG > 400    Answer:   call MD and obtain STAT lab verification    -I have reviewed the patients home medicines and have made adjustments as needed    Consultations Obtained: I requested consultation with the cardiologist,  and discussed lab and imaging findings as well as pertinent plan - they recommend: continue with epi, will consider TVP   Cardiac Monitoring: The patient was maintained on a cardiac monitor.  I personally viewed and interpreted the cardiac monitored which showed an underlying rhythm of: junctional rhythm  Social Determinants of Health:  Factors impacting patients care include:  distant history of drug use   Reevaluation: After the interventions noted above, I reevaluated the patient and found that they have :improved  Co morbidities that complicate the patient evaluation . Past Medical History:  Diagnosis Date  . Acute pericardial effusion 05/17/2022  . AKI (acute kidney injury) ([place]) 05/17/2022  . Anxiety   . Arthritis   . Cervicalgia   . Chondromalacia of patella   . Chronic neck pain   . Chronic pain syndrome   .  Depression    secondary to loss of her son at age 47  . Diabetes mellitus   . Diabetic neuropathy ([place])   . DMII (diabetes mellitus, type 2) ([place])   . Dysrhythmia   . Enthesopathy of hip region   . Fibromyalgia   . GERD (gastroesophageal reflux disease)   . Hep C w/o coma, chronic ([place])   . Hepatitis C    diagnosed 2005  . Hypertension   . Insomnia   . Insomnia   . Low back pain   . Lumbago   . Mitral regurgitation   . Obesity   . Pain in joint, upper arm   . Primary localized osteoarthrosis, lower leg   . 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 Substance abuse ([place])    sober since 2001  . Tobacco abuse   . Tricuspid regurgitation   . 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)  Hypotension, unspecified hypotension type     This chart was dictated using voice recognition software.  Despite best efforts to proofread,  errors can occur which can change the documentation meaning.    Cristie Hem, MD 07/22/22 2251

## 2022-07-22 NOTE — Consult Note (Signed)
Cardiology Consultation:   Patient ID: [name] MRN: [id number]; DOB: 01/26/1961  Admit date: 07/22/2022 Date of Consult: 07/22/2022  PCP:  Merryl Hacker, No   [facility] Providers Cardiologist:  Werner Lean, MD        Patient Profile:   [name] is a 61 y.o. female with a hx of valvular heart disease and paroxysmal atrial flutter who is being seen 07/22/2022 for the evaluation of complete heart block at the request of Dr. Truett Mainland.  History of Present Illness:   [name] is a 62 year old woman who developed severe mitral and tricuspid valve regurgitation due to inflammatory disease (possibly rheumatic or postinfectious) and underwent mitral and tricuspid valve repair on 04/19/2022, subsequently complicated by large pericardial effusion with tamponade requiring pericardiocentesis on 05/10/2022, persistent atrial flutter, chronic warfarin anticoagulation, hypertension, type 2 diabetes mellitus complicated by neuropathy, chronic diastolic heart failure and moderate pulmonary artery hypertension, remote history of substance abuse, hepatitis C, GERD, normal coronaries by previous angiography who presents with complete heart block.  Around 3:00 this afternoon she developed dizziness that became increasingly severe and associated with severe generalized weakness.  She did not have full syncope.  She has noticed reduced urine output over the last few days, but denies shortness of breath at rest or with activity, orthopnea, PND, chest pain, abdominal pain, nausea vomiting, diarrhea or bleeding.  She has had similar weak spells in the past but this was more severe and she called EMS.  On arrival she was found to have severe hypotension with systolic blood pressure around 50 and severe bradycardia around 25 bpm.  She was given intravenous atropine and started on epinephrine drip with an increase in heart rate to about 50 bpm.  Her ECG shows narrow complex accelerated junctional  rhythm.  Labs show hyperkalemia with a potassium of 6.7 (recheck 6.5) and a creatinine which is usually in the high normal ranges up to 2.0.  She takes diltiazem 360 mg once daily and metoprolol 50 mg twice daily, last administered this morning.  She is on chronic warfarin anticoagulation.  INR today is 3.4.  Currently on epinephrine drip at 15 mL/h.  Maintaining normal blood pressure and around 120/70, mentating well, lying fully supine in bed without respiratory difficulty.   Past Medical History:  Diagnosis Date   Acute pericardial effusion 05/17/2022   AKI (acute kidney injury) ([place]) 05/17/2022   Anxiety    Arthritis    Cervicalgia    Chondromalacia of patella    Chronic neck pain    Chronic pain syndrome    Depression    secondary to loss of her son at age 66   Diabetes mellitus    Diabetic neuropathy ([place])    DMII (diabetes mellitus, type 2) ([place])    Dysrhythmia    Enthesopathy of hip region    Fibromyalgia    GERD (gastroesophageal reflux disease)    Hep C w/o coma, chronic ([place])    Hepatitis C    diagnosed 2005   Hypertension    Insomnia    Insomnia    Low back pain    Lumbago    Mitral regurgitation    Obesity    Pain in joint, upper arm    Primary localized osteoarthrosis, lower leg    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   Tobacco abuse    Tricuspid regurgitation    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  Past Surgical History:  Procedure Laterality Date   ABDOMINAL HYSTERECTOMY     at age 76, unknown reasons   CARPAL TUNNEL RELEASE  12/07/2011   left side   EYE SURGERY     FOOT SURGERY Bilateral    MITRAL VALVE REPAIR N/A 04/19/2022   Procedure: MITRAL VALVE REPAIR WITH 30MM PHYSIO II ANNULOPLASTY RING;  Surgeon: Melrose Nakayama, MD;  Location: [place];  Service: Open Heart Surgery;  Laterality: N/A;   PERICARDIOCENTESIS N/A 05/10/2022   Procedure: PERICARDIOCENTESIS;  Surgeon:  Jettie Booze, MD;  Location: [place] [facility];  Service: Cardiovascular;  Laterality: N/A;   RIGHT/LEFT HEART CATH AND CORONARY ANGIOGRAPHY N/A 02/12/2022   Procedure: RIGHT/LEFT HEART CATH AND CORONARY ANGIOGRAPHY;  Surgeon: Belva Crome, MD;  Location: [place] [facility];  Service: Cardiovascular;  Laterality: N/A;   TEE WITHOUT CARDIOVERSION N/A 02/03/2022   Procedure: TRANSESOPHAGEAL ECHOCARDIOGRAM (TEE);  Surgeon: Josue Hector, MD;  Location: [facility];  Service: Cardiovascular;  Laterality: N/A;   TEE WITHOUT CARDIOVERSION N/A 04/19/2022   Procedure: TRANSESOPHAGEAL ECHOCARDIOGRAM (TEE);  Surgeon: Melrose Nakayama, MD;  Location: [place];  Service: Open Heart Surgery;  Laterality: N/A;   TRICUSPID VALVE REPLACEMENT N/A 04/19/2022   Procedure: TRICUSPID VALVE REPAIR WITH 32MM MC3 ANNULOPLASTY RING;  Surgeon: Melrose Nakayama, MD;  Location: [place];  Service: Open Heart Surgery;  Laterality: N/A;       Inpatient Medications: Scheduled Meds:  Continuous Infusions:  epinephrine 5 mcg/min (07/22/22 1931)   PRN Meds:   Allergies:    Allergies  Allergen Reactions   Hydrocodone-Acetaminophen Itching   Hydrocodone Itching    Tolerates oxycodone     Social History:   Social History   Socioeconomic History   Marital status: Single    Spouse name: Not on file   Number of children: 2   Years of education: Not on file   Highest education level: Not on file  Occupational History   Occupation: CNA    Comment: worked for 15 years   Occupation: disability  Tobacco Use   Smoking status: Every Day    Packs/day: 0.25    Years: 35.00    Total pack years: 8.75    Types: Cigarettes   Smokeless tobacco: Never   Tobacco comments:    4 cigarettes a day  Vaping Use   Vaping Use: Some days  Substance and Sexual Activity   Alcohol use: No   Drug use: No   Sexual activity: Not Currently    Comment: Celebate since 2000  Other Topics Concern   Not on file   Social History Narrative   Born and raised by mom until mom died when she was 14yo. After that she stayed with her aunt. Dad was married to someone else and was never around.  Pt has 1 brother and 1 sister and pt is the middle child. Pt had a very rough childhood. Never married. 2 kids one son died several  yrs ago and other son has Schizophrenia. Pt has graduated HS and has some college. Pt is on disability and is unemployed. 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       Financial assistance approved for 100% discount at [facility] and has [facility] card   [employer]  September 29, 2010 2:27 PM   Social Determinants of Health   Financial Resource Strain: Not on file  Food Insecurity: Not on file  Transportation Needs: Not on file  Physical  Activity: Not on file  Stress: Not on file  Social Connections: Not on file  Intimate Partner Violence: Not on file    Family History:    Family History  Problem Relation Age of Onset   Uterine cancer Mother    Alcohol abuse Father    Alcohol abuse Sister    Drug abuse Sister    Drug abuse Brother    Alcohol abuse Brother    Schizophrenia Son    Diabetes Other    Arthritis Other    Hypertension Other    Heart attack Son 59       Died suddenly     ROS:  Please see the history of present illness.   All other ROS reviewed and negative.     Physical Exam/Data:   Vitals:   07/22/22 1940 07/22/22 1945 07/22/22 1950 07/22/22 1955  BP: 99/63 (!) 111/57 109/80 (!) 121/58  Pulse: (!) 55 (!) 56 (!) 57 69  Resp: '17 18 19 19  '$ Temp:      TempSrc:      SpO2: 94% 92% 94% 97%  Weight:      Height:        Intake/Output Summary (Last 24 hours) at 07/22/2022 2011 Last data filed at 07/22/2022 1931 Gross per 24 hour  Intake 5.87 ml  Output --  Net 5.87 ml      07/22/2022    7:02 PM 06/10/2022   10:35 AM 05/29/2022    1:46 AM  Last 3 Weights  Weight (lbs) 204 lb 200 lb 6.4 oz 211 lb  Weight (kg) 92.534 kg 90.901 kg 95.709 kg     Body mass index is  28.45 kg/m.  General:  Well nourished, well developed, in no acute distress HEENT: normal Neck: no JVD Vascular: No carotid bruits; Distal pulses 2+ bilaterally Cardiac:  normal S1, S2; RRR; no diastolic murmur.  Left lower sternal border and apical holosystolic 2/6 murmur. Lungs:  clear to auscultation bilaterally, no wheezing, rhonchi or rales  Abd: soft, nontender, no hepatomegaly  Ext: no edema Musculoskeletal:  No deformities, BUE and BLE strength normal and equal Skin: warm and dry  Neuro:  CNs 2-12 intact, no focal abnormalities noted Psych:  Normal affect   EKG:  The EKG was personally reviewed and demonstrates: Accelerated narrow complex. junctional rhythm at approximately 50 bpm Telemetry:  Telemetry was personally reviewed and demonstrates: Junctional rhythm  Relevant CV Studies: Echocardiogram 05/31/2022     1. Basal septal and basal inferolateral hypokinesis.. Left ventricular  ejection fraction, by estimation, is 50 to 55%. The left ventricle has low  normal function. There is mild concentric left ventricular hypertrophy.   2. Right ventricular systolic function is moderately reduced. The right  ventricular size is normal.   3. Left atrial size was moderately dilated.   4. S/p MV repair with 30 mm annuloplasty ring (04/27/22). Peak and mean  gradients through the mitral valve are 27 and 15 mm Hg respectively. (HR  81 bpm) MVA by continuity equation is 1.4 cm2 consistent with severe MS.  Compared to previous echo gradients  are increased. . The mitral valve has been repaired/replaced. Mild mitral  valve regurgitation.   5. S/p TV repair with annuloplasty ring. TR jet is eccentric, directed  toward interatrial septum.. The tricuspid valve is has been  repaired/replaced. Tricuspid valve regurgitation is mild to moderate.   6. The aortic valve is tricuspid. Aortic valve regurgitation is trivial.  Aortic valve sclerosis/calcification is present, without any  evidence of   aortic stenosis.   7. The inferior vena cava is normal in size with greater than 50%  respiratory variability, suggesting right atrial pressure of 3 mmHg.   Laboratory Data:  High Sensitivity Troponin:   Recent Labs  Lab 07/22/22 1852  TROPONINIHS 11     Chemistry Recent Labs  Lab 07/22/22 1852 07/22/22 1906  NA 135 135  K 6.7* 6.5*  CL 105 106  CO2 19*  --   GLUCOSE 191* 179*  BUN 28* 28*  CREATININE 2.33* 2.40*  CALCIUM 7.9*  --   GFRNONAA 23*  --   ANIONGAP 11  --     Recent Labs  Lab 07/22/22 1852  PROT 6.1*  ALBUMIN 3.2*  AST 29  ALT 16  ALKPHOS 54  BILITOT 0.7   Lipids No results for input(s): "CHOL", "TRIG", "HDL", "LABVLDL", "LDLCALC", "CHOLHDL" in the last 168 hours.  Hematology Recent Labs  Lab 07/22/22 1852 07/22/22 1906  WBC 10.2  --   RBC 3.25*  --   HGB 9.0* 10.5*  HCT 29.3* 31.0*  MCV 90.2  --   MCH 27.7  --   MCHC 30.7  --   RDW 16.9*  --   PLT 190  --    Thyroid No results for input(s): "TSH", "FREET4" in the last 168 hours.  BNPNo results for input(s): "BNP", "PROBNP" in the last 168 hours.  DDimer No results for input(s): "DDIMER" in the last 168 hours.   Radiology/Studies:  DG Chest Port 1 View  Result Date: 07/22/2022 CLINICAL DATA:  Back pain and bradycardia EXAM: PORTABLE CHEST 1 VIEW COMPARISON:  05/28/2022 FINDINGS: Cardiac shadow is within normal limits. Postsurgical changes are again seen. Lungs are well aerated bilaterally. No acute bony abnormality is seen. IMPRESSION: No active disease. Electronically Signed   By: Inez Catalina M.D.   On: 07/22/2022 19:38     Assessment and Plan:   Complete heart block: Likely multifactorial related to hyperkalemia and treatment with beta-blockers and calcium channel blockers, possibly with some degree of underlying AV node conduction abnormalities following valvular heart surgery.  Currently hemodynamically compensated on epinephrine drip.  Implantation of a temporary transvenous wire  is considered, but does come with some risk since she is not only anticoagulated, but with a slightly supratherapeutic INR.  Option to proceed with transvenous pacemaker wire later this evening if she decompensates.  Discussed briefly with Dr. Ali Lowe and on-call cardiology. Acute renal failure/hyperkalemia: Etiology is uncertain.  She is on a tiny dose of diuretic.  She has been eating normally.  She has noticed decreased urine output.  It is possible that severe bradycardia due to heart block preceded and actually caused the acute kidney injury.  Receiving intravenous calcium and glucose/insulin in an attempt to reverse the hyperkalemia.  So far this has not led to any improvement in the rhythm abnormality. S/P mitral and tricuspid valve repair: Most recent echocardiogram does show some elevated gradients across the mitral annuloplasty, but it was performed only about a month following surgery and she was still anemic at the time, probably leading to overestimation of the gradients.  Good technical result with only mild MR mild-moderate TR.  White blood cell count is normal today and no other signs of infection at this time.  She is still anemic with a hemoglobin of 9.0. History of atrial flutter/atrial fibrillation: It appears that the history of paroxysmal atrial fibrillation precedes the acute valvular problems.  On warfarin anticoagulation.   Risk  Assessment/Risk Scores:         CHA2DS2-VASc score not relevant in patient with valvular heart disease, presumed to be possibly due to rheumatic disease and subsequent mitral valve/tricuspid valve repair. CHA2DS2-VASc Score = 3   This indicates a 3.2% annual risk of stroke. The patient's score is based upon: CHF History: 0 HTN History: 1 Diabetes History: 1 Stroke History: 0 Vascular Disease History: 0 Age Score: 0 Gender Score: 1         For questions or updates, please contact [place] Please consult [email or web address] for contact info  under    Signed, Sanda Klein, MD  07/22/2022 8:11 PM

## 2022-07-22 NOTE — ED Notes (Signed)
Cardiology at bedside and aware of BP and HR

## 2022-07-22 NOTE — ED Notes (Signed)
UA attempted. Pt uses bedpan. Does not feel need to go at this time.

## 2022-07-22 NOTE — ED Triage Notes (Signed)
Pt arrived to ED via [facility] from home w/ c/o symptomatic bradycardia. Per EMS pt was feeling dizzy around 1500. EMS reports initial HR and rhythm was junctional at 25. EMS started an 18g L AC and gave '1mg'$  atropine and started pt on an epi gtt infusing about 64mg/min which ran for about 10 mins. Epi was clamped upon arrival to the room. Pt was paced by EMS but upon arrival to ED, pt was not being paced. Initial BP 50/30. Last VS: HR 65 and SBP 100. Pt was 88% RA w/ EMS. Pt was placed on 4L O2 via [place]. Upon arrival to ED pt was 85% on 4L. Increased O2 to 6L and instructed pt to take deep breaths through her nose. Once O2 came up to 96, weened pt back down to 4L via [place]. Pt is not on O2 at baseline. Pt is A&Ox4.

## 2022-07-22 NOTE — H&P (Addendum)
History and Physical    Patient: [name] URK:[id number] DOB: Apr 30, 1961 DOA: 07/22/2022 DOS: the patient was seen and examined on 07/22/2022 PCP: Pcp, No  Patient coming from: Home  Chief Complaint:  Chief Complaint  Patient presents with   Bradycardia   HPI: [name] is a 61 y.o. female with medical history significant of atrial fibrillation/ flutter on Coumadin, valvular heart disease status post mitral and tricuspid valve repair on 04/19/2022 with pericardial effusion status post pericardiocentesis 05/10/22, diabetes mellitus type 2, GERD, hypertension, valvular heart disease, chronic pain syndrome, presented to the hospital with dizziness and lightheadedness since the afternoon which she reported was severe with generalized weakness.  EMS was called in and patient was noted to be hypotensive with a systolic blood pressure in the 50s with bradycardia at 25.  EMS gave atropine and was started on epinephrine drip and was brought into the hospital. Patient did report mild shortness of breath with it. Patient denies any  chest pain or palpitation.  She denied loss of consciousness.  Denied any headache,  nausea, vomiting, abdominal pain.  Denies any urinary urgency frequency or dysuria but has been noticing decreased urinary output.  No mention of diarrhea or constipation.  Denies any fever chills or rigor.  In the ED, patient was noted to have bradycardia with blood pressure of 78/55.  Laboratory data remarkable for a potassium of 6.5, creatinine of 2.4, ionized calcium was 0.9.  WBC was 10.2.  INR therapeutic at 3.4.  Patient received atropine 0.5 mg, calcium gluconate, Lasix 40 mg x 1, IV dextrose, epinephrine drip in the ED and was considered for admission to the hospital for further work-up.  Critical care team was consulted by the ED who reviewed the case and since the patient's heart rate was improving including the blood pressure, patient was considered for admission to the  hospitalist team.  Review of Systems: As mentioned in the history of present illness. All other systems reviewed and are negative.  Past Medical History:  Diagnosis Date   Acute pericardial effusion 05/17/2022   AKI (acute kidney injury) ([place]) 05/17/2022   Anxiety    Arthritis    Cervicalgia    Chondromalacia of patella    Chronic neck pain    Chronic pain syndrome    Depression    secondary to loss of her son at age 45   Diabetes mellitus    Diabetic neuropathy ([place])    DMII (diabetes mellitus, type 2) ([place])    Dysrhythmia    Enthesopathy of hip region    Fibromyalgia    GERD (gastroesophageal reflux disease)    Hep C w/o coma, chronic ([place])    Hepatitis C    diagnosed 2005   Hypertension    Insomnia    Insomnia    Low back pain    Lumbago    Mitral regurgitation    Obesity    Pain in joint, upper arm    Primary localized osteoarthrosis, lower leg    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   Substance abuse ([place])    sober since 2001   Tobacco abuse    Tricuspid regurgitation    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   Past Surgical History:  Procedure Laterality Date   ABDOMINAL HYSTERECTOMY     at age 27, unknown reasons   CARPAL TUNNEL RELEASE  12/07/2011   left side   EYE SURGERY     FOOT SURGERY Bilateral    MITRAL VALVE  REPAIR N/A 04/19/2022   Procedure: MITRAL VALVE REPAIR WITH 30MM PHYSIO II ANNULOPLASTY RING;  Surgeon: Melrose Nakayama, MD;  Location: [facility];  Service: Open Heart Surgery;  Laterality: N/A;   PERICARDIOCENTESIS N/A 05/10/2022   Procedure: PERICARDIOCENTESIS;  Surgeon: Jettie Booze, MD;  Location: [facility];  Service: Cardiovascular;  Laterality: N/A;   RIGHT/LEFT HEART CATH AND CORONARY ANGIOGRAPHY N/A 02/12/2022   Procedure: RIGHT/LEFT HEART CATH AND CORONARY ANGIOGRAPHY;  Surgeon: Belva Crome, MD;  Location: [facility];  Service: Cardiovascular;  Laterality: N/A;   TEE WITHOUT CARDIOVERSION  N/A 02/03/2022   Procedure: TRANSESOPHAGEAL ECHOCARDIOGRAM (TEE);  Surgeon: Josue Hector, MD;  Location: [facility] ENDOSCOPY;  Service: Cardiovascular;  Laterality: N/A;   TEE WITHOUT CARDIOVERSION N/A 04/19/2022   Procedure: TRANSESOPHAGEAL ECHOCARDIOGRAM (TEE);  Surgeon: Melrose Nakayama, MD;  Location: [facility];  Service: Open Heart Surgery;  Laterality: N/A;   TRICUSPID VALVE REPLACEMENT N/A 04/19/2022   Procedure: TRICUSPID VALVE REPAIR WITH 32MM MC3 ANNULOPLASTY RING;  Surgeon: Melrose Nakayama, MD;  Location: [facility];  Service: Open Heart Surgery;  Laterality: N/A;   Social History:  reports that she has been smoking cigarettes. She has a 8.75 pack-year smoking history. She has never used smokeless tobacco. She reports that she does not drink alcohol and does not use drugs.  Allergies  Allergen Reactions   Hydrocodone-Acetaminophen Itching   Hydrocodone Itching    Tolerates oxycodone     Family History  Problem Relation Age of Onset   Uterine cancer Mother    Alcohol abuse Father    Alcohol abuse Sister    Drug abuse Sister    Drug abuse Brother    Alcohol abuse Brother    Schizophrenia Son    Diabetes Other    Arthritis Other    Hypertension Other    Heart attack Son 10       Died suddenly    Prior to Admission medications   Medication Sig Start Date End Date Taking? Authorizing Provider  ACCU-CHEK FASTCLIX LANCETS MISC 1 each by Does not apply route daily. Check blood sugar once daily. 250.00 10/26/11   [name], DO  acetaminophen (TYLENOL) 500 MG tablet Take 1,000 mg by mouth every 6 (six) hours as needed for mild pain.    [provider]  albuterol (PROVENTIL HFA;VENTOLIN HFA) 108 (90 Base) MCG/ACT inhaler Inhale 2 puffs into the lungs every 6 (six) hours as needed for wheezing or shortness of breath.     [provider]  ARIPiprazole (ABILIFY) 5 MG tablet Take 5 mg by mouth daily. 07/20/22   [provider]  aspirin 81 MG EC  tablet Take 81 mg by mouth daily.    [provider]  atorvastatin (LIPITOR) 20 MG tablet Take 20 mg by mouth daily.    [provider]  blood glucose meter kit and supplies KIT Dispense based on patient and insurance preference. Use up to four times daily as directed. 05/26/22   [name], MD  buPROPion (WELLBUTRIN XL) 300 MG 24 hr tablet Take 300 mg by mouth in the morning. 03/24/22   [provider]  calcium carbonate (OSCAL) 1500 (600 Ca) MG TABS tablet Take 600 mg of elemental calcium by mouth daily with breakfast.    [provider]  Cyanocobalamin (VITAMIN B 12 PO) Take 500 mcg by mouth daily.    [provider]  diclofenac Sodium (VOLTAREN) 1 % GEL Apply 2-4 g  topically 4 (four) times daily as needed (joint pain). 05/10/22   [provider]  diltiazem (CARDIZEM CD) 360 MG 24 hr capsule Take 1 capsule (360 mg total) by mouth daily. 05/27/22   [name], MD  escitalopram (LEXAPRO) 10 MG tablet Take 10 mg by mouth every morning. 07/08/22   [provider]  ferrous sulfate 325 (65 FE) MG tablet Take 325 mg by mouth daily with breakfast.    [provider]  FLUoxetine (PROZAC) 20 MG capsule Take 20 mg by mouth every morning. 04/14/22   [provider]  furosemide (LASIX) 20 MG tablet Take 1 tablet (20 mg total) by mouth daily. 05/27/22   [name], MD  gabapentin (NEURONTIN) 600 MG tablet Take 0.5 tablets (300 mg total) by mouth 3 (three) times daily. 05/30/22   [name], [name], MD  glucose blood (ACCU-CHEK SMARTVIEW) test strip Check blood sugar once daily. 250.00 10/26/11 12/30/21  [name], DO  hydrOXYzine (ATARAX) 25 MG tablet Take 25 mg by mouth every 4 (four) hours as needed for anxiety.    [provider]  Lifitegrast [facility] Friar OP) Place 1 drop into both eyes daily.    [provider]  linagliptin (TRADJENTA) 5 MG TABS tablet Take 5 mg by mouth in the morning.    [provider]  losartan (COZAAR) 100 MG tablet Take 100 mg by mouth daily. 07/06/22   [provider]  methocarbamol (ROBAXIN) 500 MG tablet Take 1 tablet (500 mg total) by mouth every 8 (eight) hours as needed for muscle spasms. 05/26/22   [name], MD  metoprolol tartrate (LOPRESSOR) 50 MG tablet Take 1 tablet (50 mg total) by mouth 2 (two) times daily. 05/17/22   [name], NP  naloxone [facility]) nasal spray 4 mg/0.1 mL Place 1 spray into the nose as needed (accidental overdose).    [provider]  nystatin ointment (MYCOSTATIN) Apply 1 Application topically 2 (two) times daily as needed (irriation). 04/05/22   [provider]  omeprazole (PRILOSEC) 10 MG capsule Take 10 mg by mouth daily. 07/02/22   [provider]  omeprazole (PRILOSEC) 20 MG capsule Take 20 mg by mouth daily.    [provider]  oxyCODONE (ROXICODONE) 15 MG immediate release tablet Take 0.5 tablets (7.5 mg total) by mouth every 8 (eight) hours as needed for pain. 05/30/22   [name], [name], MD  PREMARIN 0.45 MG tablet Take 0.45 mg by mouth daily. 01/11/22   [provider]  TIADYLT ER 300 MG 24 hr capsule Take 300 mg by mouth daily. 07/14/22   [provider]  traZODone (DESYREL) 100 MG tablet Take 2.5 tablets (250 mg total) by mouth at bedtime as needed for sleep. Patient taking differently: Take 200 mg by mouth at bedtime as needed for sleep. 11/23/18   [name], MD  warfarin (COUMADIN) 5 MG tablet TAKE 1 TABLET BY MOUTH DAILY OR AS DIRECTED BY ANTICOAGULATION CLINIC. Patient taking differently: Take 5 mg by mouth daily. Take 1 tablet by mouth daily or as directed by Anticoagulation Clinic. 07/02/22   Werner Lean, MD    Physical Exam: Vitals:   07/22/22 2120 07/22/22 2125 07/22/22 2200 07/22/22 2215  BP: (!) 110/57  110/69 111/74  Pulse: (!) 45 (!) 50 (!) 59 (!) 57  Resp: 17 (!) 25 14 14   Temp:      TempSrc:      SpO2:  95%  92%  Weight:      Height:       Body mass index is 28.45 kg/m.   General:  Average built, not in obvious distress HENT:   No scleral pallor or icterus noted. Oral mucosa is moist.  Chest:  Clear breath sounds.  Diminished breath sounds bilaterally. No crackles or wheezes.  CVS: S1 &S2 heard.  Systolic murmur noted..  Regular rate and rhythm.  Bradycardic.  Midline scar noted. Abdomen: Soft, nontender, nondistended.  Bowel sounds are heard.   Extremities: No cyanosis, clubbing or edema.  Peripheral pulses are palpable. Psych: Alert, awake and oriented, normal mood CNS:  No cranial nerve deficits.  Power equal in all extremities.   Skin: Warm and dry.  No rashes noted.  Data Reviewed:    Latest Ref Rng & Units 07/22/2022    7:06 PM 07/22/2022    6:52 PM 05/30/2022    1:34 AM  CBC  WBC 4.0 - 10.5 K/uL  10.2  5.0   Hemoglobin 12.0 - 15.0 g/dL 10.5  9.0  8.9   Hematocrit 36.0 - 46.0 % 31.0  29.3  28.2   Platelets 150 - 400 K/uL  190  200        Latest Ref Rng & Units 07/22/2022    8:37 PM 07/22/2022    7:06 PM 07/22/2022    6:52 PM  BMP  Glucose 70 - 99 mg/dL  179  191   BUN 8 - 23 mg/dL  28  28   Creatinine 0.44 - 1.00 mg/dL  2.40  2.33   Sodium 135 - 145 mmol/L  135  135   Potassium 3.5 - 5.1 mmol/L 5.7  6.5  6.7   Chloride 98 - 111 mmol/L  106  105   CO2 22 - 32 mmol/L   19   Calcium 8.9 - 10.3 mg/dL   7.9      Assessment and Plan:  Principal Problem:   Bradycardia Active Problems:   Hypertension associated with diabetes (HCC)   Anticoagulated   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   Acute renal failure superimposed on stage 2 chronic kidney disease (HCC)   Chronic heart failure with preserved ejection fraction (HFpEF) (HCC)   Atrial fibrillation (HCC)   Hypotension with significant bradycardia leading to dizziness lightheadedness.  EKG showed junctional rhythm.  We will correct her electrolytes.  Admit to progressive care.  Patient has been seen by cardiology.  Patient had received atropine followed by epinephrine infusion.  Has been taken off epinephrine infusion at this time and her blood pressure and heart rate have improved. Hold Cardizem and beta-blocker from home.  Also hold Lasix and losartan for now.  Check TSH.  Review of recent 2D echocardiogram from 05/31/2022 showed LV ejection fraction of 50 to 55%.  Might need temporary pacemaker if hemodynamically unstable.We will follow cardiology recommendation.    Hyperkalemia.  Patient received temporizing measures in the ED.  Potassium level has come down to 5.7 at this time.  Initial potassium level of 6.7.  We will add Lokelma x1.  Check BMP in AM.  Hypocalcemia.  Ionized calcium low as well on i-STAT.  Received 2 doses of IV calcium gluconate in the ED for cardiac stabilization..  We will add oral calcium supplement.  Check BMP in AM.  Acute kidney injury on CKD stage II.  Could be secondary to hypotension.  Creatinine on presentation at 2.3.  Creatinine one month  back was 1.1.  Baseline creatinine around 1.1.  Patient received Lasix in the ED for hyperkalemia treatment.  Continue with IV fluids overnight.  Check BMP in AM.  Chronic pain syndrome.  Continue oxycodone from home.    Diabetes mellitus type II.  Patient is on Tradjenta at home.  Will put the patient on sliding scale insulin.  Check hemoglobin A1c  Paroxysmal atrial flutter/valvular heart disease atrial fibrillation.  On Coumadin as an outpatient.  Consult pharmacy to dose Coumadin.  Hold beta-blockers and Cardizem due to symptomatic bradycardia.  GERD.  Continue PPI.   Advance Care Planning:   Code Status: Full code  Consults: cardiology   Family Communication: None at bedside  Severity of Illness: The appropriate patient status for this patient is INPATIENT. Inpatient status is judged to be reasonable and necessary in order to provide the required intensity of service to ensure the patient's safety. The patient's presenting  symptoms, physical exam findings, and initial radiographic and laboratory data in the context of their chronic comorbidities is felt to place them at high risk for further clinical deterioration. Furthermore, it is not anticipated that the patient will be medically stable for discharge from the hospital within 2 midnights of admission. I certify that at the point of admission it is my clinical judgment that the patient will require inpatient hospital care spanning beyond 2 midnights from the point of admission due to high intensity of service, high risk for further deterioration and high frequency of surveillance required.  Author: Flora Lipps, MD 07/22/2022 10:25 PM  For on call review [email or web address].

## 2022-07-23 DIAGNOSIS — R001 Bradycardia, unspecified: Secondary | ICD-10-CM | POA: Diagnosis not present

## 2022-07-23 DIAGNOSIS — I5032 Chronic diastolic (congestive) heart failure: Secondary | ICD-10-CM | POA: Diagnosis not present

## 2022-07-23 DIAGNOSIS — E875 Hyperkalemia: Secondary | ICD-10-CM

## 2022-07-23 DIAGNOSIS — Z9889 Other specified postprocedural states: Secondary | ICD-10-CM | POA: Diagnosis not present

## 2022-07-23 DIAGNOSIS — D649 Anemia, unspecified: Secondary | ICD-10-CM

## 2022-07-23 DIAGNOSIS — I480 Paroxysmal atrial fibrillation: Secondary | ICD-10-CM | POA: Diagnosis not present

## 2022-07-23 LAB — URINALYSIS, ROUTINE W REFLEX MICROSCOPIC
Bilirubin Urine: NEGATIVE
Glucose, UA: NEGATIVE mg/dL
Hgb urine dipstick: NEGATIVE
Ketones, ur: NEGATIVE mg/dL
Leukocytes,Ua: NEGATIVE
Nitrite: NEGATIVE
Protein, ur: NEGATIVE mg/dL
Specific Gravity, Urine: 1.01 (ref 1.005–1.030)
pH: 5 (ref 5.0–8.0)

## 2022-07-23 LAB — COMPREHENSIVE METABOLIC PANEL
ALT: 19 U/L (ref 0–44)
AST: 40 U/L (ref 15–41)
Albumin: 3.5 g/dL (ref 3.5–5.0)
Alkaline Phosphatase: 60 U/L (ref 38–126)
Anion gap: 8 (ref 5–15)
BUN: 29 mg/dL — ABNORMAL HIGH (ref 8–23)
CO2: 21 mmol/L — ABNORMAL LOW (ref 22–32)
Calcium: 8.3 mg/dL — ABNORMAL LOW (ref 8.9–10.3)
Chloride: 105 mmol/L (ref 98–111)
Creatinine, Ser: 2.08 mg/dL — ABNORMAL HIGH (ref 0.44–1.00)
GFR, Estimated: 26 mL/min — ABNORMAL LOW (ref >60)
Glucose, Bld: 79 mg/dL (ref 70–99)
Potassium: 4.8 mmol/L (ref 3.5–5.1)
Sodium: 134 mmol/L — ABNORMAL LOW (ref 135–145)
Total Bilirubin: 0.6 mg/dL (ref 0.3–1.2)
Total Protein: 6.6 g/dL (ref 6.5–8.1)

## 2022-07-23 LAB — CBC
HCT: 30.2 % — ABNORMAL LOW (ref 36.0–46.0)
Hemoglobin: 9.7 g/dL — ABNORMAL LOW (ref 12.0–15.0)
MCH: 28.2 pg (ref 26.0–34.0)
MCHC: 32.1 g/dL (ref 30.0–36.0)
MCV: 87.8 fL (ref 80.0–100.0)
Platelets: 178 10*3/uL (ref 150–400)
RBC: 3.44 MIL/uL — ABNORMAL LOW (ref 3.87–5.11)
RDW: 16.7 % — ABNORMAL HIGH (ref 11.5–15.5)
WBC: 8.9 10*3/uL (ref 4.0–10.5)
nRBC: 0 % (ref 0.0–0.2)

## 2022-07-23 LAB — GLUCOSE, CAPILLARY
Glucose-Capillary: 118 mg/dL — ABNORMAL HIGH (ref 70–99)
Glucose-Capillary: 98 mg/dL (ref 70–99)
Glucose-Capillary: 146 mg/dL — ABNORMAL HIGH (ref 70–99)
Glucose-Capillary: 111 mg/dL — ABNORMAL HIGH (ref 70–99)

## 2022-07-23 LAB — TSH: TSH: 2.434 u[IU]/mL (ref 0.350–4.500)

## 2022-07-23 LAB — LACTIC ACID, PLASMA: Lactic Acid, Venous: 1.2 mmol/L (ref 0.5–1.9)

## 2022-07-23 LAB — HEMOGLOBIN A1C
Hgb A1c MFr Bld: 6 % — ABNORMAL HIGH (ref 4.8–5.6)
Mean Plasma Glucose: 126 mg/dL

## 2022-07-23 LAB — PROTIME-INR
INR: 3.1 — ABNORMAL HIGH (ref 0.8–1.2)
Prothrombin Time: 31.4 seconds — ABNORMAL HIGH (ref 11.4–15.2)

## 2022-07-23 LAB — MAGNESIUM: Magnesium: 1.5 mg/dL — ABNORMAL LOW (ref 1.7–2.4)

## 2022-07-23 MED ORDER — MAGNESIUM SULFATE 4 GM/100ML IV SOLN
4.0000 g | Freq: Once | INTRAVENOUS | Status: AC
  Administered 2022-07-23: 4 g via INTRAVENOUS
  Filled 2022-07-23: qty 100

## 2022-07-23 MED ORDER — CALCIUM GLUCONATE-NACL 2-0.675 GM/100ML-% IV SOLN
2.0000 g | Freq: Once | INTRAVENOUS | Status: AC
Start: 2022-07-23 — End: 2022-07-24
  Administered 2022-07-23: 2000 mg via INTRAVENOUS
  Filled 2022-07-23 (×2): qty 100

## 2022-07-23 MED ORDER — MELATONIN 5 MG PO TABS
10.0000 mg | ORAL_TABLET | Freq: Every evening | ORAL | Status: DC | PRN
  Administered 2022-07-23: 10 mg via ORAL
  Filled 2022-07-23: qty 2

## 2022-07-23 MED ORDER — WARFARIN SODIUM 2.5 MG PO TABS
2.5000 mg | ORAL_TABLET | Freq: Once | ORAL | Status: AC
  Administered 2022-07-23: 2.5 mg via ORAL
  Filled 2022-07-23: qty 1

## 2022-07-23 MED ORDER — CALCIUM GLUCONATE 10 % IV SOLN
2.0000 g | Freq: Once | INTRAVENOUS | Status: DC
  Filled 2022-07-23: qty 20

## 2022-07-23 MED ORDER — AYR SALINE NASAL NA GEL
1.0000 | NASAL | Status: DC | PRN
  Administered 2022-07-23: 1 via NASAL
  Filled 2022-07-23: qty 14.1

## 2022-07-23 NOTE — Hospital Course (Addendum)
[name] is a 61 yo female with PMH atrial fibrillation/flutter on Coumadin, valvular heart disease status post mitral and tricuspid valve repair (annuloplasty ring) on 04/19/2022 with pericardial effusion s/p pericardiocentesis 05/10/22, DMII, GERD, HTN, chronic pain syndrome who presented to the hospital with dizziness and lightheadedness.  Upon EMS arrival she was found to be hypotensive with SBP in the 50s and bradycardic in the 20s.  She was given atropine and started on an epinephrine drip and brought to the hospital.  She had some mild shortness of breath associated but denied any chest pain or palpitations. In the ER she continued to remain hypotensive and bradycardic.  Cardiology was consulted in case of need for pacing. She was admitted for further observation and monitoring. Her medication list and pharmacy fill history was reviewed with the help of pharmacy after admission.  She was inadvertently taking 2 different prescriptions of diltiazem and therefore taking much larger dose than needed.  Diltiazem was discontinued altogether at discharge.  She was continued on metoprolol for rate control. With monitoring during hospitalization, heart rate and blood pressure returned to normal.  Prolonged PR also resolved. She has follow-up planned with cardiology outpatient after discharge.

## 2022-07-23 NOTE — Assessment & Plan Note (Signed)
-   Improved with treatment - Continue trending BMP

## 2022-07-23 NOTE — Assessment & Plan Note (Signed)
-   Last echo 05/31/2022: EF 50 to 55%.  Basal, septal, and inferolateral hypokinesis -No signs or symptoms of exacerbation at this time - Continue fluids

## 2022-07-23 NOTE — Assessment & Plan Note (Signed)
-   continue coumadin  - goal INR 2-3

## 2022-07-23 NOTE — Assessment & Plan Note (Signed)
-   Database reviewed.  Last fill of oxycodone 06/23/2022 -Home dose resumed at discharge

## 2022-07-23 NOTE — Progress Notes (Signed)
ANTICOAGULATION CONSULT NOTE  Pharmacy Consult for warfarin Indication: atrial fibrillation  Allergies  Allergen Reactions   Hydrocodone-Acetaminophen Itching   Hydrocodone Itching    Tolerates oxycodone     Patient Measurements: Height: '5\' 11"'$  (180.3 cm) Weight: 94.3 kg (207 lb 12.8 oz) IBW/kg (Calculated) : 70.8  Vital Signs: Temp: 97.6 F (36.4 C) (08/18 0730) Temp Source: Oral (08/18 0730) BP: 101/65 (08/18 0730) Pulse Rate: 64 (08/18 0730)  Labs: Recent Labs    07/22/22 1852 07/22/22 1906 07/22/22 2052 07/23/22 0146  HGB 9.0* 10.5*  --  9.7*  HCT 29.3* 31.0*  --  30.2*  PLT 190  --   --  178  LABPROT 34.3*  --   --  31.4*  INR 3.4*  --   --  3.1*  CREATININE 2.33* 2.40*  --  2.08*  TROPONINIHS 11  --  9  --      Estimated Creatinine Clearance: 35.5 mL/min (A) (by C-G formula based on SCr of 2.08 mg/dL (H)).   Medical History: Past Medical History:  Diagnosis Date   Acute pericardial effusion 05/17/2022   AKI (acute kidney injury) ([place]) 05/17/2022   Anxiety    Arthritis    Cervicalgia    Chondromalacia of patella    Chronic neck pain    Chronic pain syndrome    Depression    secondary to loss of her son at age 27   Diabetes mellitus    Diabetic neuropathy ([place])    DMII (diabetes mellitus, type 2) ([place])    Dysrhythmia    Enthesopathy of hip region    Fibromyalgia    GERD (gastroesophageal reflux disease)    Hep C w/o coma, chronic ([place])    Hepatitis C    diagnosed 2005   Hypertension    Insomnia    Insomnia    Low back pain    Lumbago    Mitral regurgitation    Obesity    Pain in joint, upper arm    Primary localized osteoarthrosis, lower leg    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  No overt bleeding or complications noted.  Goal of Therapy:  INR 2-3 Monitor platelets by anticoagulation protocol: Yes   Plan:  Smaller dose of Coumadin today - 2.5 mg po x 1. Daily INR.  [place] Crane, Roylene Reason, BCCP Clinical Pharmacist  07/23/2022 9:18 AM   [facility] pharmacy phone numbers are listed on [email or web address]

## 2022-07-23 NOTE — Plan of Care (Signed)
°  Problem: Education: °Goal: Knowledge of General Education information will improve °Description: Including pain rating scale, medication(s)/side effects and non-pharmacologic comfort measures °Outcome: Progressing °  °Problem: Clinical Measurements: °Goal: Ability to maintain clinical measurements within normal limits will improve °Outcome: Progressing °  °Problem: Clinical Measurements: °Goal: Cardiovascular complication will be avoided °Outcome: Progressing °  °

## 2022-07-23 NOTE — Assessment & Plan Note (Signed)
-   Continue SSI and CBG monitoring - A1c 5.4%

## 2022-07-23 NOTE — Assessment & Plan Note (Addendum)
-   She may have a component of CKD however baseline is very fluctuating with trending prior labs - Regardless, creatinine 2.33 on admission which does appear consistent with AKI at the least.  Does have risk for ATN in setting of hypotension and bradycardia -Renal function improved with fluids

## 2022-07-23 NOTE — Assessment & Plan Note (Signed)
-   Patient presented with symptomatic bradycardia with associated dizziness/lightheadedness and significant bradycardia down into the 20s and hypotension with SBP in the 50s - Briefly started on epinephrine drip with EMS and was also given atropine - Slowly vitals have been improving since admission - After further investigation, patient has been inadvertently taking increased dose of diltiazem at home (2 prescriptions given from outpt pharmacy) -Blood pressure and heart rate returned to normal with further drug washout - Discontinued off of diltiazem altogether at discharge - She will continue on metoprolol only - outpt follow up with cardiology scheduled

## 2022-07-23 NOTE — Progress Notes (Signed)
Patient arrived to the unit, VSS, on 3L O2 via nasal cannula (Room air was 87%). Will continue to monitor throughout the shift.

## 2022-07-23 NOTE — Progress Notes (Signed)
Progress Note  Patient Name: [name] Date of Encounter: 07/23/2022  [facility] Cardiologist: Werner Lean, MD   Subjective   Feeling much better this morning.  Only complaint is pain in the back of her head when she eats. She was inadvertently taking a much higher than expected dose of diltiazem since she had 2 different prescriptions filled. She went back to normal AV conduction with a low atrial rhythm around 2200 hrs. and then converted to normal sinus rhythm shortly after midnight last night.  She has been in normal rhythm since then but does have occasional long cycles of second-degree AV block Mobitz type I with an occasional dropped P wave. Potassium level is now normal, but creatinine remains elevated at 2.0.  Inpatient Medications    Scheduled Meds:  aspirin EC  81 mg Oral Daily   atorvastatin  20 mg Oral Daily   calcium carbonate  1 tablet Oral BID WC   calcium gluconate  2 g Intravenous Once   gabapentin  300 mg Oral TID   insulin aspart  0-5 Units Subcutaneous QHS   insulin aspart  0-9 Units Subcutaneous TID WC   pantoprazole  40 mg Oral Daily   sodium chloride flush  3 mL Intravenous Q12H   warfarin  2.5 mg Oral ONCE-1600   Warfarin - Pharmacist Dosing Inpatient   Does not apply q1600   Continuous Infusions:  sodium chloride     sodium chloride Stopped (07/23/22 0644)   magnesium sulfate bolus IVPB     PRN Meds: sodium chloride, acetaminophen **OR** acetaminophen, albuterol, atropine, naloxone, ondansetron **OR** ondansetron (ZOFRAN) IV, oxyCODONE, polyethylene glycol, saline, sodium chloride flush   Vital Signs    Vitals:   07/23/22 0056 07/23/22 0500 07/23/22 0525 07/23/22 0730  BP: 132/83  115/60 101/65  Pulse: 75  85 64  Resp: '20  20 18  '$ Temp: 97.9 F (36.6 C)  98 F (36.7 C) 97.6 F (36.4 C)  TempSrc: Oral  Oral Oral  SpO2: 92% 92%  (!) 83%  Weight: 94.3 kg     Height: '5\' 11"'$  (1.803 m)       Intake/Output Summary (Last  24 hours) at 07/23/2022 1008 Last data filed at 07/23/2022 0900 Gross per 24 hour  Intake 1360.12 ml  Output 1200 ml  Net 160.12 ml      07/23/2022   12:56 AM 07/22/2022    7:02 PM 06/10/2022   10:35 AM  Last 3 Weights  Weight (lbs) 207 lb 12.8 oz 204 lb 200 lb 6.4 oz  Weight (kg) 94.257 kg 92.534 kg 90.901 kg      Telemetry    Sinus rhythm with occasional long cycles of second-degree AV Wenckebach cycles dropping a P wave every 9 or 10 beats.- Personally Reviewed  ECG    Sinus rhythm with long first-degree AV block PR 270 ms- Personally Reviewed  Physical Exam  Comfortable lying fully supine in bed GEN: No acute distress.   Neck: No JVD Cardiac: RRR, 1/6 holosystolic murmur at the left lower sternal border and apex, no diastolic murmurs, rubs, or gallops.  Respiratory: Clear to auscultation bilaterally. GI: Soft, nontender, non-distended  MS: No edema; No deformity. Neuro:  Nonfocal  Psych: Normal affect   Labs    High Sensitivity Troponin:   Recent Labs  Lab 07/22/22 1852 07/22/22 2052  TROPONINIHS 11 9     Chemistry Recent Labs  Lab 07/22/22 1852 07/22/22 1906 07/22/22 2037 07/23/22 0146  NA 135 135  --  134*  K 6.7* 6.5* 5.7* 4.8  CL 105 106  --  105  CO2 19*  --   --  21*  GLUCOSE 191* 179*  --  79  BUN 28* 28*  --  29*  CREATININE 2.33* 2.40*  --  2.08*  CALCIUM 7.9*  --   --  8.3*  MG  --   --   --  1.5*  PROT 6.1*  --   --  6.6  ALBUMIN 3.2*  --   --  3.5  AST 29  --   --  40  ALT 16  --   --  19  ALKPHOS 54  --   --  60  BILITOT 0.7  --   --  0.6  GFRNONAA 23*  --   --  26*  ANIONGAP 11  --   --  8    Lipids No results for input(s): "CHOL", "TRIG", "HDL", "LABVLDL", "LDLCALC", "CHOLHDL" in the last 168 hours.  Hematology Recent Labs  Lab 07/22/22 1852 07/22/22 1906 07/23/22 0146  WBC 10.2  --  8.9  RBC 3.25*  --  3.44*  HGB 9.0* 10.5* 9.7*  HCT 29.3* 31.0* 30.2*  MCV 90.2  --  87.8  MCH 27.7  --  28.2  MCHC 30.7  --  32.1  RDW  16.9*  --  16.7*  PLT 190  --  178   Thyroid  Recent Labs  Lab 07/22/22 2358  TSH 2.434    BNPNo results for input(s): "BNP", "PROBNP" in the last 168 hours.  DDimer No results for input(s): "DDIMER" in the last 168 hours.   Radiology    DG Chest Port 1 View  Result Date: 07/22/2022 CLINICAL DATA:  Back pain and bradycardia EXAM: PORTABLE CHEST 1 VIEW COMPARISON:  05/28/2022 FINDINGS: Cardiac shadow is within normal limits. Postsurgical changes are again seen. Lungs are well aerated bilaterally. No acute bony abnormality is seen. IMPRESSION: No active disease. Electronically Signed   By: Inez Catalina M.D.   On: 07/22/2022 19:38    Cardiac Studies   Echocardiogram 05/31/2022      1. Basal septal and basal inferolateral hypokinesis.. Left ventricular  ejection fraction, by estimation, is 50 to 55%. The left ventricle has low  normal function. There is mild concentric left ventricular hypertrophy.   2. Right ventricular systolic function is moderately reduced. The right  ventricular size is normal.   3. Left atrial size was moderately dilated.   4. S/p MV repair with 30 mm annuloplasty ring (04/27/22). Peak and mean  gradients through the mitral valve are 27 and 15 mm Hg respectively. (HR  81 bpm) MVA by continuity equation is 1.4 cm2 consistent with severe MS.  Compared to previous echo gradients  are increased. . The mitral valve has been repaired/replaced. Mild mitral  valve regurgitation.   5. S/p TV repair with annuloplasty ring. TR jet is eccentric, directed  toward interatrial septum.. The tricuspid valve is has been  repaired/replaced. Tricuspid valve regurgitation is mild to moderate.   6. The aortic valve is tricuspid. Aortic valve regurgitation is trivial.  Aortic valve sclerosis/calcification is present, without any evidence of  aortic stenosis.   7. The inferior vena cava is normal in size with greater than 50%  respiratory variability, suggesting right atrial  pressure of 3 mmHg.     Patient Profile     61 y.o. female presenting with complete heart block complicated by hypotension and acute kidney injury, background  history of recent mitral and tricuspid valve repair, paroxysmal atrial fibrillation and atrial flutter on warfarin anticoagulation  Assessment & Plan    Back in normal rhythm today, but still with evidence of significant AV conduction prolongation. Most likely scenario is she had advanced AV block due to combined beta-blocker and high-dose calcium channel blocker therapy.  Hypotension and  low cardiac output led to secondary acute kidney injury and hyperkalemia.   We will stop the diltiazem completely.  Plan to reinstate treatment with metoprolol when she has full normalization of AV conduction (probably tomorrow morning).  Continue to follow renal function to make sure that she does not have worsening ATN following with may be a long period of hypotension. 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     For questions or updates, please contact [facility] Please consult [email or web address] for contact info under        Signed, Sanda Klein, MD  07/23/2022, 10:08 AM

## 2022-07-23 NOTE — Progress Notes (Signed)
Methocarbamol and oxycodone '15mg'$  for her chronic pain. These meds are listed on the home list. I told patient that I will note this for her provider team to add if it is safe for her.

## 2022-07-23 NOTE — Plan of Care (Signed)
  Problem: Health Behavior/Discharge Planning: Goal: Ability to manage health-related needs will improve Outcome: Progressing   Problem: Clinical Measurements: Goal: Diagnostic test results will improve Outcome: Progressing Goal: Respiratory complications will improve Outcome: Progressing   

## 2022-07-23 NOTE — Progress Notes (Signed)
Progress Note    [name]   EQA:[id number]  DOB: Oct 21, 1961  DOA: 07/22/2022     1 PCP: Pcp, No  Initial CC: dizziness  Hospital Course: [name] is a 61 yo female with PMH atrial fibrillation/flutter on Coumadin, valvular heart disease status post mitral and tricuspid valve repair (annuloplasty ring) on 04/19/2022 with pericardial effusion s/p pericardiocentesis 05/10/22, DMII, GERD, HTN, chronic pain syndrome who presented to the hospital with dizziness and lightheadedness.  Upon EMS arrival [name] was found to be hypotensive with SBP in the 50s and bradycardic in the 20s.  [name] was given atropine and started on an epinephrine drip and brought to the hospital.  [name] had some mild shortness of breath associated but denied any chest pain or palpitations. In the ER [name] continued to remain hypotensive and bradycardic.  Cardiology was consulted in case of need for pacing. [name] was admitted for further observation and monitoring.  Interval History:  Seen this morning resting in bed much more comfortably.  Denies any further dizziness.  We reviewed medication regimen at home and [name] was aware that [name] had been mistakenly taking more than prescribed diltiazem due to ambiguity of names on the bottles.  Assessment and Plan: * Bradycardia - Patient presented with symptomatic bradycardia with associated dizziness/lightheadedness and significant bradycardia down into the 20s and hypotension with SBP in the 50s - Briefly started on epinephrine drip with EMS and was also given atropine - Slowly vitals have been improving since admission - After further investigation, patient has been inadvertently taking increased dose of diltiazem at home (2 prescriptions given from outpt pharmacy) - still having some occasional 2nd AV-block; cardiology following as well  - med adjustment to be determined prior to discharge - will continue to monitor electrolytes: Ca, Mg, K  AKI (acute kidney injury) ([place]) -  [name] may have a component of CKD however baseline is very fluctuating with trending prior labs - Regardless, creatinine 2.33 on admission which does appear consistent with AKI at the least.  Does have risk for ATN in setting of hypotension and bradycardia - Continue fluids for now for aid in perfusion - Repeat BMP in a.m.  Normocytic anemia - Baseline hemoglobin around 10 g/dL - Currently at baseline  Atrial fibrillation (HCC) - continue coumadin  - goal INR 2-3  Chronic heart failure with preserved ejection fraction (HFpEF) ([place]) - Last echo 05/31/2022: EF 50 to 55%.  Basal, septal, and inferolateral hypokinesis -No signs or symptoms of exacerbation at this time - Continue fluids  Hypertension associated with diabetes ([place]) - Home meds on hold in setting of severe hypotension on admission - Vitals steadily improving  Chronic pain syndrome - Database reviewed.  Last fill of oxycodone 06/23/2022 - Lower dose oxycodone continued in hospital in setting of hypotension and bradycardia  Type 2 diabetes mellitus without complication, without long-term current use of insulin (HCC) - Continue SSI and CBG monitoring - A1c 5.4%  Hyperkalemia-resolved as of 07/23/2022 - Improved with treatment - Continue trending BMP    Old records reviewed in assessment of this patient  Antimicrobials:   DVT prophylaxis:   warfarin (COUMADIN) tablet 2.5 mg   Code Status:   Code Status: Full Code  Mobility Assessment (last 72 hours)     Mobility Assessment     Row Name 07/23/22 0102           Does patient have an order for bedrest or is patient medically unstable No - Continue assessment  What is the highest level of mobility based on the progressive mobility assessment? Level 6 (Walks independently in room and hall) - Balance while walking in room without assist - Complete                Barriers to discharge: none Disposition Plan:  Home Saturday Status is:  Inpt  Objective: Blood pressure 101/65, pulse 64, temperature 97.6 F (36.4 C), temperature source Oral, resp. rate 18, height '5\' 11"'$  (1.803 m), weight 94.3 kg, last menstrual period 12/06/1976, SpO2 (!) 83 %.  Examination:  Physical Exam Constitutional:      General: [name] is not in acute distress.    Appearance: Normal appearance. [name] is not ill-appearing.  HENT:     Head: Normocephalic and atraumatic.     Mouth/Throat:     Mouth: Mucous membranes are moist.  Eyes:     Extraocular Movements: Extraocular movements intact.  Cardiovascular:     Rate and Rhythm: Regular rhythm. Bradycardia present.  Pulmonary:     Effort: Pulmonary effort is normal.     Breath sounds: Normal breath sounds.  Abdominal:     General: Bowel sounds are normal. There is no distension.     Palpations: Abdomen is soft.     Tenderness: There is no abdominal tenderness.  Musculoskeletal:        General: Normal range of motion.     Cervical back: Normal range of motion and neck supple.  Skin:    General: Skin is warm and dry.  Neurological:     General: No focal deficit present.     Mental Status: [name] is alert.  Psychiatric:        Mood and Affect: Mood normal.        Behavior: Behavior normal.      Consultants:  Cardiology  Procedures:    Data Reviewed: Results for orders placed or performed during the hospital encounter of 07/22/22 (from the past 24 hour(s))  Comprehensive metabolic panel     Status: Abnormal   Collection Time: 07/22/22  6:52 PM  Result Value Ref Range   Sodium 135 135 - 145 mmol/L   Potassium 6.7 (HH) 3.5 - 5.1 mmol/L   Chloride 105 98 - 111 mmol/L   CO2 19 (L) 22 - 32 mmol/L   Glucose, Bld 191 (H) 70 - 99 mg/dL   BUN 28 (H) 8 - 23 mg/dL   Creatinine, Ser 2.33 (H) 0.44 - 1.00 mg/dL   Calcium 7.9 (L) 8.9 - 10.3 mg/dL   Total Protein 6.1 (L) 6.5 - 8.1 g/dL   Albumin 3.2 (L) 3.5 - 5.0 g/dL   AST 29 15 - 41 U/L   ALT 16 0 - 44 U/L   Alkaline Phosphatase 54 38 - 126 U/L    Total Bilirubin 0.7 0.3 - 1.2 mg/dL   GFR, Estimated 23 (L) >60 mL/min   Anion gap 11 5 - 15  Troponin I (High Sensitivity)     Status: None   Collection Time: 07/22/22  6:52 PM  Result Value Ref Range   Troponin I (High Sensitivity) 11 <18 ng/L  CBC with Differential     Status: Abnormal   Collection Time: 07/22/22  6:52 PM  Result Value Ref Range   WBC 10.2 4.0 - 10.5 K/uL   RBC 3.25 (L) 3.87 - 5.11 MIL/uL   Hemoglobin 9.0 (L) 12.0 - 15.0 g/dL   HCT 29.3 (L) 36.0 - 46.0 %   MCV 90.2 80.0 - 100.0 fL  MCH 27.7 26.0 - 34.0 pg   MCHC 30.7 30.0 - 36.0 g/dL   RDW 16.9 (H) 11.5 - 15.5 %   Platelets 190 150 - 400 K/uL   nRBC 0.0 0.0 - 0.2 %   Neutrophils Relative % 61 %   Neutro Abs 6.4 1.7 - 7.7 K/uL   Lymphocytes Relative 26 %   Lymphs Abs 2.6 0.7 - 4.0 K/uL   Monocytes Relative 7 %   Monocytes Absolute 0.7 0.1 - 1.0 K/uL   Eosinophils Relative 1 %   Eosinophils Absolute 0.1 0.0 - 0.5 K/uL   Basophils Relative 1 %   Basophils Absolute 0.1 0.0 - 0.1 K/uL   Immature Granulocytes 4 %   Abs Immature Granulocytes 0.37 (H) 0.00 - 0.07 K/uL  Lactic acid, plasma     Status: None   Collection Time: 07/22/22  6:52 PM  Result Value Ref Range   Lactic Acid, Venous 1.7 0.5 - 1.9 mmol/L  Protime-INR     Status: Abnormal   Collection Time: 07/22/22  6:52 PM  Result Value Ref Range   Prothrombin Time 34.3 (H) 11.4 - 15.2 seconds   INR 3.4 (H) 0.8 - 1.2  I-stat chem 8, ED     Status: Abnormal   Collection Time: 07/22/22  7:06 PM  Result Value Ref Range   Sodium 135 135 - 145 mmol/L   Potassium 6.5 (HH) 3.5 - 5.1 mmol/L   Chloride 106 98 - 111 mmol/L   BUN 28 (H) 8 - 23 mg/dL   Creatinine, Ser 2.40 (H) 0.44 - 1.00 mg/dL   Glucose, Bld 179 (H) 70 - 99 mg/dL   Calcium, Ion 0.99 (L) 1.15 - 1.40 mmol/L   TCO2 19 (L) 22 - 32 mmol/L   Hemoglobin 10.5 (L) 12.0 - 15.0 g/dL   HCT 31.0 (L) 36.0 - 46.0 %   Comment NOTIFIED PHYSICIAN   CBG monitoring, ED     Status: Abnormal   Collection  Time: 07/22/22  7:23 PM  Result Value Ref Range   Glucose-Capillary 163 (H) 70 - 99 mg/dL  Potassium     Status: Abnormal   Collection Time: 07/22/22  8:37 PM  Result Value Ref Range   Potassium 5.7 (H) 3.5 - 5.1 mmol/L  Troponin I (High Sensitivity)     Status: None   Collection Time: 07/22/22  8:52 PM  Result Value Ref Range   Troponin I (High Sensitivity) 9 <18 ng/L  CBG monitoring, ED     Status: None   Collection Time: 07/22/22 11:41 PM  Result Value Ref Range   Glucose-Capillary 86 70 - 99 mg/dL  TSH     Status: None   Collection Time: 07/22/22 11:58 PM  Result Value Ref Range   TSH 2.434 0.350 - 4.500 uIU/mL  Hemoglobin A1c     Status: None   Collection Time: 07/22/22 11:58 PM  Result Value Ref Range   Hgb A1c MFr Bld 5.4 4.8 - 5.6 %   Mean Plasma Glucose 108.28 mg/dL  Urinalysis, Routine w reflex microscopic Urine, Clean Catch     Status: Abnormal   Collection Time: 07/23/22  1:09 AM  Result Value Ref Range   Color, Urine YELLOW YELLOW   APPearance HAZY (A) CLEAR   Specific Gravity, Urine 1.010 1.005 - 1.030   pH 5.0 5.0 - 8.0   Glucose, UA NEGATIVE NEGATIVE mg/dL   Hgb urine dipstick NEGATIVE NEGATIVE   Bilirubin Urine NEGATIVE NEGATIVE   Ketones, ur NEGATIVE NEGATIVE  mg/dL   Protein, ur NEGATIVE NEGATIVE mg/dL   Nitrite NEGATIVE NEGATIVE   Leukocytes,Ua NEGATIVE NEGATIVE  Lactic acid, plasma     Status: None   Collection Time: 07/23/22  1:46 AM  Result Value Ref Range   Lactic Acid, Venous 1.2 0.5 - 1.9 mmol/L  Comprehensive metabolic panel     Status: Abnormal   Collection Time: 07/23/22  1:46 AM  Result Value Ref Range   Sodium 134 (L) 135 - 145 mmol/L   Potassium 4.8 3.5 - 5.1 mmol/L   Chloride 105 98 - 111 mmol/L   CO2 21 (L) 22 - 32 mmol/L   Glucose, Bld 79 70 - 99 mg/dL   BUN 29 (H) 8 - 23 mg/dL   Creatinine, Ser 2.08 (H) 0.44 - 1.00 mg/dL   Calcium 8.3 (L) 8.9 - 10.3 mg/dL   Total Protein 6.6 6.5 - 8.1 g/dL   Albumin 3.5 3.5 - 5.0 g/dL   AST 40  15 - 41 U/L   ALT 19 0 - 44 U/L   Alkaline Phosphatase 60 38 - 126 U/L   Total Bilirubin 0.6 0.3 - 1.2 mg/dL   GFR, Estimated 26 (L) >60 mL/min   Anion gap 8 5 - 15  CBC     Status: Abnormal   Collection Time: 07/23/22  1:46 AM  Result Value Ref Range   WBC 8.9 4.0 - 10.5 K/uL   RBC 3.44 (L) 3.87 - 5.11 MIL/uL   Hemoglobin 9.7 (L) 12.0 - 15.0 g/dL   HCT 30.2 (L) 36.0 - 46.0 %   MCV 87.8 80.0 - 100.0 fL   MCH 28.2 26.0 - 34.0 pg   MCHC 32.1 30.0 - 36.0 g/dL   RDW 16.7 (H) 11.5 - 15.5 %   Platelets 178 150 - 400 K/uL   nRBC 0.0 0.0 - 0.2 %  Protime-INR     Status: Abnormal   Collection Time: 07/23/22  1:46 AM  Result Value Ref Range   Prothrombin Time 31.4 (H) 11.4 - 15.2 seconds   INR 3.1 (H) 0.8 - 1.2  Magnesium     Status: Abnormal   Collection Time: 07/23/22  1:46 AM  Result Value Ref Range   Magnesium 1.5 (L) 1.7 - 2.4 mg/dL  Glucose, capillary     Status: Abnormal   Collection Time: 07/23/22  6:26 AM  Result Value Ref Range   Glucose-Capillary 118 (H) 70 - 99 mg/dL  Glucose, capillary     Status: None   Collection Time: 07/23/22 11:15 AM  Result Value Ref Range   Glucose-Capillary 98 70 - 99 mg/dL   Comment 1 Document in Chart     I have Reviewed nursing notes, Vitals, and Lab results since pt's last encounter. Pertinent lab results : see above I have ordered test including BMP, CBC, Mg I have reviewed the last note from staff over past 24 hours I have discussed pt's care plan and test results with nursing staff, case manager   LOS: 1 day   Dwyane Dee, MD [facility] 07/23/2022, 1:41 PM

## 2022-07-23 NOTE — Assessment & Plan Note (Signed)
-   Baseline hemoglobin around 10 g/dL - Currently at baseline

## 2022-07-23 NOTE — Assessment & Plan Note (Signed)
-   Home meds on hold in setting of severe hypotension on admission - Vitals steadily improving

## 2022-07-24 DIAGNOSIS — R001 Bradycardia, unspecified: Secondary | ICD-10-CM | POA: Diagnosis not present

## 2022-07-24 LAB — BASIC METABOLIC PANEL
Anion gap: 5 (ref 5–15)
BUN: 21 mg/dL (ref 8–23)
CO2: 22 mmol/L (ref 22–32)
Calcium: 8.3 mg/dL — ABNORMAL LOW (ref 8.9–10.3)
Chloride: 109 mmol/L (ref 98–111)
Creatinine, Ser: 1.24 mg/dL — ABNORMAL HIGH (ref 0.44–1.00)
GFR, Estimated: 49 mL/min — ABNORMAL LOW (ref >60)
Glucose, Bld: 103 mg/dL — ABNORMAL HIGH (ref 70–99)
Potassium: 4.6 mmol/L (ref 3.5–5.1)
Sodium: 136 mmol/L (ref 135–145)

## 2022-07-24 LAB — CBC WITH DIFFERENTIAL/PLATELET
Abs Immature Granulocytes: 0.02 10*3/uL (ref 0.00–0.07)
Basophils Absolute: 0 10*3/uL (ref 0.0–0.1)
Basophils Relative: 0 %
Eosinophils Absolute: 0.1 10*3/uL (ref 0.0–0.5)
Eosinophils Relative: 2 %
HCT: 25 % — ABNORMAL LOW (ref 36.0–46.0)
Hemoglobin: 8.1 g/dL — ABNORMAL LOW (ref 12.0–15.0)
Immature Granulocytes: 0 %
Lymphocytes Relative: 23 %
Lymphs Abs: 1.2 10*3/uL (ref 0.7–4.0)
MCH: 27.8 pg (ref 26.0–34.0)
MCHC: 32.4 g/dL (ref 30.0–36.0)
MCV: 85.9 fL (ref 80.0–100.0)
Monocytes Absolute: 0.5 10*3/uL (ref 0.1–1.0)
Monocytes Relative: 10 %
Neutro Abs: 3.3 10*3/uL (ref 1.7–7.7)
Neutrophils Relative %: 65 %
Platelets: 163 10*3/uL (ref 150–400)
RBC: 2.91 MIL/uL — ABNORMAL LOW (ref 3.87–5.11)
RDW: 16 % — ABNORMAL HIGH (ref 11.5–15.5)
WBC: 5.1 10*3/uL (ref 4.0–10.5)
nRBC: 0 % (ref 0.0–0.2)

## 2022-07-24 LAB — GLUCOSE, CAPILLARY
Glucose-Capillary: 98 mg/dL (ref 70–99)
Glucose-Capillary: 126 mg/dL — ABNORMAL HIGH (ref 70–99)

## 2022-07-24 LAB — PROTIME-INR
INR: 2.7 — ABNORMAL HIGH (ref 0.8–1.2)
Prothrombin Time: 28.7 seconds — ABNORMAL HIGH (ref 11.4–15.2)

## 2022-07-24 LAB — MAGNESIUM: Magnesium: 1.6 mg/dL — ABNORMAL LOW (ref 1.7–2.4)

## 2022-07-24 MED ORDER — METOPROLOL TARTRATE 50 MG PO TABS
50.0000 mg | ORAL_TABLET | Freq: Two times a day (BID) | ORAL | Status: DC
  Administered 2022-07-24: 50 mg via ORAL
  Filled 2022-07-24: qty 1

## 2022-07-24 MED ORDER — MAGNESIUM SULFATE 2 GM/50ML IV SOLN
2.0000 g | Freq: Once | INTRAVENOUS | Status: AC
Start: 2022-07-24 — End: 2022-07-24
  Administered 2022-07-24: 2 g via INTRAVENOUS
  Filled 2022-07-24: qty 50

## 2022-07-24 MED ORDER — WARFARIN SODIUM 5 MG PO TABS
5.0000 mg | ORAL_TABLET | Freq: Once | ORAL | Status: AC
  Administered 2022-07-24: 5 mg via ORAL
  Filled 2022-07-24: qty 1

## 2022-07-24 NOTE — Progress Notes (Signed)
ANTICOAGULATION CONSULT NOTE  Pharmacy Consult for warfarin Indication: atrial fibrillation  Allergies  Allergen Reactions   Hydrocodone-Acetaminophen Itching   Hydrocodone Itching    Tolerates oxycodone     Patient Measurements: Height: '5\' 11"'$  (180.3 cm) Weight: 95.3 kg (210 lb 3.2 oz) IBW/kg (Calculated) : 70.8  Vital Signs: Temp: 98.8 F (37.1 C) (08/19 0715) Temp Source: Oral (08/19 0715) BP: 160/94 (08/19 1215) Pulse Rate: 115 (08/19 1215)  Labs: Recent Labs    07/22/22 1852 07/22/22 1906 07/22/22 2052 07/23/22 0146 07/24/22 0322  HGB 9.0* 10.5*  --  9.7* 8.1*  HCT 29.3* 31.0*  --  30.2* 25.0*  PLT 190  --   --  178 163  LABPROT 34.3*  --   --  31.4* 28.7*  INR 3.4*  --   --  3.1* 2.7*  CREATININE 2.33* 2.40*  --  2.08* 1.24*  TROPONINIHS 11  --  9  --   --     Estimated Creatinine Clearance: 59.9 mL/min (A) (by C-G formula based on SCr of 1.24 mg/dL (H)).   Medical History: Past Medical History:  Diagnosis Date   Acute pericardial effusion 05/17/2022   AKI (acute kidney injury) ([place]) 05/17/2022   Anxiety    Arthritis    Cervicalgia    Chondromalacia of patella    Chronic neck pain    Chronic pain syndrome    Depression    secondary to loss of her son at age 61   Diabetes mellitus    Diabetic neuropathy ([place])    DMII (diabetes mellitus, type 2) ([place])    Dysrhythmia    Enthesopathy of hip region    Fibromyalgia    GERD (gastroesophageal reflux disease)    Hep C w/o coma, chronic ([place])    Hepatitis C    diagnosed 2005   Hypertension    Insomnia    Insomnia    Low back pain    Lumbago    Mitral regurgitation    Obesity    Pain in joint, upper arm    Primary localized osteoarthrosis, lower leg    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   Substance abuse ([place])    sober since 2001   Tobacco abuse    Tricuspid regurgitation    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   PTA dosing: '5mg'$  daily, except 7.'5mg'$  Sundays  INR 2.7 today down from 3.1.  No overt bleeding or complications noted.  Goal of Therapy:  INR 2-3 Monitor platelets by anticoagulation protocol: Yes   Plan:  Warfarin 5 mg x 1 tonight Daily PT/INR  Eliseo Gum, PharmD PGY1 Pharmacy Resident   07/24/2022  12:28 PM   [facility] pharmacy phone numbers are listed on [email or web address]

## 2022-07-24 NOTE — Plan of Care (Signed)
  Problem: Education: Goal: Ability to describe self-care measures that may prevent or decrease complications (Diabetes Survival Skills Education) will improve Outcome: Progressing Goal: Individualized Educational Video(s) Outcome: Progressing   Problem: Coping: Goal: Ability to adjust to condition or change in health will improve Outcome: Progressing   Problem: Fluid Volume: Goal: Ability to maintain a balanced intake and output will improve Outcome: Progressing   Problem: Health Behavior/Discharge Planning: Goal: Ability to identify and utilize available resources and services will improve Outcome: Progressing Goal: Ability to manage health-related needs will improve Outcome: Progressing   Problem: Education: Goal: Ability to describe self-care measures that may prevent or decrease complications (Diabetes Survival Skills Education) will improve Outcome: Progressing   Problem: Health Behavior/Discharge Planning: Goal: Ability to identify and utilize available resources and services will improve Outcome: Progressing Goal: Ability to manage health-related needs will improve Outcome: Progressing   Problem: Metabolic: Goal: Ability to maintain appropriate glucose levels will improve Outcome: Progressing   Problem: Nutritional: Goal: Maintenance of adequate nutrition will improve Outcome: Progressing Goal: Progress toward achieving an optimal weight will improve Outcome: Progressing   Problem: Skin Integrity: Goal: Risk for impaired skin integrity will decrease Outcome: Progressing   Problem: Tissue Perfusion: Goal: Adequacy of tissue perfusion will improve Outcome: Progressing   Problem: Education: Goal: Knowledge of General Education information will improve Description: Including pain rating scale, medication(s)/side effects and non-pharmacologic comfort measures Outcome: Progressing   Problem: Health Behavior/Discharge Planning: Goal: Ability to manage  health-related needs will improve Outcome: Progressing   Problem: Clinical Measurements: Goal: Ability to maintain clinical measurements within normal limits will improve Outcome: Progressing Goal: Will remain free from infection Outcome: Progressing Goal: Diagnostic test results will improve Outcome: Progressing Goal: Respiratory complications will improve Outcome: Progressing Goal: Cardiovascular complication will be avoided Outcome: Progressing   Problem: Activity: Goal: Risk for activity intolerance will decrease Outcome: Progressing   Problem: Nutrition: Goal: Adequate nutrition will be maintained Outcome: Progressing   Problem: Elimination: Goal: Will not experience complications related to bowel motility Outcome: Progressing Goal: Will not experience complications related to urinary retention Outcome: Progressing   Problem: Pain Managment: Goal: General experience of comfort will improve Outcome: Progressing   Problem: Safety: Goal: Ability to remain free from injury will improve Outcome: Progressing   Problem: Skin Integrity: Goal: Risk for impaired skin integrity will decrease Outcome: Progressing   Problem: Education: Goal: Ability to demonstrate management of disease process will improve Outcome: Progressing Goal: Ability to verbalize understanding of medication therapies will improve Outcome: Progressing Goal: Individualized Educational Video(s) Outcome: Progressing   Problem: Activity: Goal: Capacity to carry out activities will improve Outcome: Progressing   Problem: Cardiac: Goal: Ability to achieve and maintain adequate cardiopulmonary perfusion will improve Outcome: Progressing

## 2022-07-24 NOTE — Progress Notes (Signed)
Progress Note  Patient Name: [name] Date of Encounter: 07/24/2022  [facility] Cardiologist: Werner Lean, MD   Subjective   Feeling much better.  Smiling broadly. Back in sinus rhythm, PR interval has shortened to normal range.  Inpatient Medications    Scheduled Meds:  aspirin EC  81 mg Oral Daily   atorvastatin  20 mg Oral Daily   calcium carbonate  1 tablet Oral BID WC   gabapentin  300 mg Oral TID   insulin aspart  0-5 Units Subcutaneous QHS   insulin aspart  0-9 Units Subcutaneous TID WC   metoprolol tartrate  50 mg Oral BID   pantoprazole  40 mg Oral Daily   sodium chloride flush  3 mL Intravenous Q12H   Warfarin - Pharmacist Dosing Inpatient   Does not apply q1600   Continuous Infusions:  sodium chloride     sodium chloride 100 mL/hr at 07/23/22 1148   PRN Meds: sodium chloride, acetaminophen **OR** acetaminophen, albuterol, atropine, melatonin, naloxone, ondansetron **OR** ondansetron (ZOFRAN) IV, oxyCODONE, polyethylene glycol, saline, sodium chloride flush   Vital Signs    Vitals:   07/23/22 2005 07/24/22 0057 07/24/22 0600 07/24/22 0715  BP: 109/62 (!) 104/52 135/87 132/85  Pulse: 84 92 86 97  Resp: '18 18 18 18  '$ Temp: 98.8 F (37.1 C) 98.8 F (37.1 C) 98.6 F (37 C) 98.8 F (37.1 C)  TempSrc: Oral Oral Oral Oral  SpO2: 97% 99% 90% 90%  Weight:  94.2 kg 95.3 kg   Height:        Intake/Output Summary (Last 24 hours) at 07/24/2022 1033 Last data filed at 07/24/2022 0830 Gross per 24 hour  Intake 720 ml  Output 2250 ml  Net -1530 ml      07/24/2022    6:00 AM 07/24/2022   12:57 AM 07/23/2022   12:56 AM  Last 3 Weights  Weight (lbs) 210 lb 3.2 oz 207 lb 10.8 oz 207 lb 12.8 oz  Weight (kg) 95.346 kg 94.2 kg 94.257 kg      Telemetry    Normal sinus rhythm- Personally Reviewed  ECG    No new tracing- Personally Reviewed  Physical Exam  Smiling broadly, looks very comfortable lying fully supine in bed GEN: No acute  distress.   Neck: No JVD Cardiac: RRR, 6-2/7 holosystolic murmur heard at the left lower sternal border and at the apex, no diastolic murmurs, rubs, or gallops.  Respiratory: Clear to auscultation bilaterally. GI: Soft, nontender, non-distended  MS: No edema; No deformity. Neuro:  Nonfocal  Psych: Normal affect   Labs    High Sensitivity Troponin:   Recent Labs  Lab 07/22/22 1852 07/22/22 2052  TROPONINIHS 11 9     Chemistry Recent Labs  Lab 07/22/22 1852 07/22/22 1906 07/22/22 2037 07/23/22 0146 07/24/22 0322  NA 135 135  --  134* 136  K 6.7* 6.5* 5.7* 4.8 4.6  CL 105 106  --  105 109  CO2 19*  --   --  21* 22  GLUCOSE 191* 179*  --  79 103*  BUN 28* 28*  --  29* 21  CREATININE 2.33* 2.40*  --  2.08* 1.24*  CALCIUM 7.9*  --   --  8.3* 8.3*  MG  --   --   --  1.5* 1.6*  PROT 6.1*  --   --  6.6  --   ALBUMIN 3.2*  --   --  3.5  --   AST 29  --   --  40  --   ALT 16  --   --  19  --   ALKPHOS 54  --   --  60  --   BILITOT 0.7  --   --  0.6  --   GFRNONAA 23*  --   --  26* 49*  ANIONGAP 11  --   --  8 5    Lipids No results for input(s): "CHOL", "TRIG", "HDL", "LABVLDL", "LDLCALC", "CHOLHDL" in the last 168 hours.  Hematology Recent Labs  Lab 07/22/22 1852 07/22/22 1906 07/23/22 0146 07/24/22 0322  WBC 10.2  --  8.9 5.1  RBC 3.25*  --  3.44* 2.91*  HGB 9.0* 10.5* 9.7* 8.1*  HCT 29.3* 31.0* 30.2* 25.0*  MCV 90.2  --  87.8 85.9  MCH 27.7  --  28.2 27.8  MCHC 30.7  --  32.1 32.4  RDW 16.9*  --  16.7* 16.0*  PLT 190  --  178 163   Thyroid  Recent Labs  Lab 07/22/22 2358  TSH 2.434    BNPNo results for input(s): "BNP", "PROBNP" in the last 168 hours.  DDimer No results for input(s): "DDIMER" in the last 168 hours.   Radiology    DG Chest Port 1 View  Result Date: 07/22/2022 CLINICAL DATA:  Back pain and bradycardia EXAM: PORTABLE CHEST 1 VIEW COMPARISON:  05/28/2022 FINDINGS: Cardiac shadow is within normal limits. Postsurgical changes are again  seen. Lungs are well aerated bilaterally. No acute bony abnormality is seen. IMPRESSION: No active disease. Electronically Signed   By: Inez Catalina M.D.   On: 07/22/2022 19:38    Cardiac Studies   Echocardiogram 05/31/2022      1. Basal septal and basal inferolateral hypokinesis.. Left ventricular  ejection fraction, by estimation, is 50 to 55%. The left ventricle has low  normal function. There is mild concentric left ventricular hypertrophy.   2. Right ventricular systolic function is moderately reduced. The right  ventricular size is normal.   3. Left atrial size was moderately dilated.   4. S/p MV repair with 30 mm annuloplasty ring (04/27/22). Peak and mean  gradients through the mitral valve are 27 and 15 mm Hg respectively. (HR  81 bpm) MVA by continuity equation is 1.4 cm2 consistent with severe MS.  Compared to previous echo gradients  are increased. . The mitral valve has been repaired/replaced. Mild mitral  valve regurgitation.   5. S/p TV repair with annuloplasty ring. TR jet is eccentric, directed  toward interatrial septum.. The tricuspid valve is has been  repaired/replaced. Tricuspid valve regurgitation is mild to moderate.   6. The aortic valve is tricuspid. Aortic valve regurgitation is trivial.  Aortic valve sclerosis/calcification is present, without any evidence of  aortic stenosis.   7. The inferior vena cava is normal in size with greater than 50%  respiratory variability, suggesting right atrial pressure of 3 mmHg.     Patient Profile     61 y.o. female presenting with complete heart block complicated by hypotension and acute kidney injury, background history of recent mitral and tricuspid valve repair, paroxysmal atrial fibrillation and atrial flutter on warfarin anticoagulation  Assessment & Plan    Back in normal rhythm. Renal function quickly improving back towards baseline.  Potassium now normal. I recommend permanently discontinuing all of the  prescriptions for diltiazem and all doses. Continue metoprolol 50 mg twice daily. Fully anticoagulated on warfarin.  Stop aspirin to reduce bleeding risk. [facility] will sign off.  Medication Recommendations:    Stop all prescriptions for diltiazem (TyaDilt, Cardizem CD) Stop aspirin  Continue metoprolol tartrate 50 mg twice daily Continue warfarin adjusted for INR Continue atorvastatin 20 mg daily Continue furosemide 20 mg daily  Other recommendations (labs, testing, etc): INR in Coumadin clinic as previously scheduled Follow up as an outpatient: Has an appointment scheduled on 08/06/2022 with Dr. Gasper Sells  For questions or updates, please contact [place] Please consult [email or web address] for contact info under        Signed, Sanda Klein, MD  07/24/2022, 10:33 AM

## 2022-07-24 NOTE — Discharge Summary (Signed)
Physician Discharge Summary   [name] NIO:[id number] DOB: Mar 21, 1961 DOA: 07/22/2022  PCP: Pcp, No  Admit date: 07/22/2022 Discharge date: 07/24/2022  Barriers to discharge: none  Admitted From: Home Disposition:  Home Discharging physician: Dwyane Dee, MD  Recommendations for Outpatient Follow-up:  Follow up with cardiology Follow up BP/HR  Home Health:  Equipment/Devices:   Discharge Condition: stable CODE STATUS: Full Diet recommendation:  Diet Orders (From admission, onward)     Start     Ordered   07/24/22 0000  Diet - low sodium heart healthy        07/24/22 1049   07/24/22 0000  Diet Carb Modified        07/24/22 1049   07/22/22 2237  Diet heart healthy/carb modified Room service appropriate? Yes; Fluid consistency: Thin  Diet effective now       Question Answer Comment  Diet-HS Snack? Nothing   Room service appropriate? Yes   Fluid consistency: Thin      07/22/22 2239            Hospital Course: [name] is a 61 yo female with PMH atrial fibrillation/flutter on Coumadin, valvular heart disease status post mitral and tricuspid valve repair (annuloplasty ring) on 04/19/2022 with pericardial effusion s/p pericardiocentesis 05/10/22, DMII, GERD, HTN, chronic pain syndrome who presented to the hospital with dizziness and lightheadedness.  Upon EMS arrival she was found to be hypotensive with SBP in the 50s and bradycardic in the 20s.  She was given atropine and started on an epinephrine drip and brought to the hospital.  She had some mild shortness of breath associated but denied any chest pain or palpitations. In the ER she continued to remain hypotensive and bradycardic.  Cardiology was consulted in case of need for pacing. She was admitted for further observation and monitoring. Her medication list and pharmacy fill history was reviewed with the help of pharmacy after admission.  She was inadvertently taking 2 different prescriptions of diltiazem  and therefore taking much larger dose than needed.  Diltiazem was discontinued altogether at discharge.  She was continued on metoprolol for rate control. With monitoring during hospitalization, heart rate and blood pressure returned to normal.  Prolonged PR also resolved. She has follow-up planned with cardiology outpatient after discharge.  Assessment and Plan: * Bradycardia-resolved as of 07/24/2022 - Patient presented with symptomatic bradycardia with associated dizziness/lightheadedness and significant bradycardia down into the 20s and hypotension with SBP in the 50s - Briefly started on epinephrine drip with EMS and was also given atropine - Slowly vitals have been improving since admission - After further investigation, patient has been inadvertently taking increased dose of diltiazem at home (2 prescriptions given from outpt pharmacy) -Blood pressure and heart rate returned to normal with further drug washout - Discontinued off of diltiazem altogether at discharge - She will continue on metoprolol only - outpt follow up with cardiology scheduled  AKI (acute kidney injury) (HCC)-resolved as of 07/24/2022 - She may have a component of CKD however baseline is very fluctuating with trending prior labs - Regardless, creatinine 2.33 on admission which does appear consistent with AKI at the least.  Does have risk for ATN in setting of hypotension and bradycardia -Renal function improved with fluids  Normocytic anemia - Baseline hemoglobin around 10 g/dL - Currently at baseline  Atrial fibrillation ([place]) - continue coumadin  - goal INR 2-3  Chronic heart failure with preserved ejection fraction (HFpEF) ([place]) - Last echo 05/31/2022: EF 50 to 55%.  Basal, septal, and inferolateral hypokinesis -No signs or symptoms of exacerbation at this time  Hypertension associated with diabetes ([place]) - Diltiazem discontinued at discharge  Chronic pain syndrome - Database reviewed.  Last fill of  oxycodone 06/23/2022 -Home dose resumed at discharge  Type 2 diabetes mellitus without complication, without long-term current use of insulin (HCC) - Continue SSI and CBG monitoring - A1c 5.4%  Hyperkalemia-resolved as of 07/23/2022 - Improved with treatment - Continue trending BMP       The patient's chronic medical conditions were treated accordingly per the patient's home medication regimen except as noted.  On day of discharge, patient was felt deemed stable for discharge. Patient/family member advised to call PCP or come back to ER if needed.   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   AKI (acute kidney injury) ([place]) 05/17/2022 07/24/2022    Priority: 2.   Hyperkalemia 07/23/2022 07/23/2022     Discharge Instructions     Diet - low sodium heart healthy   Complete by: As directed    Diet Carb Modified   Complete by: As directed    Increase activity slowly   Complete by: As directed       Allergies as of 07/24/2022       Reactions   Hydrocodone-acetaminophen Itching   Hydrocodone Itching   Tolerates oxycodone         Medication List     STOP taking these medications    aspirin EC 81 MG tablet   diltiazem 360 MG 24 hr capsule Commonly known as: CARDIZEM CD   Tiadylt ER 300 MG 24 hr capsule Generic drug: diltiazem       TAKE these medications    Accu-Chek FastClix  Lancets Misc 1 each by Does not apply route daily. Check blood sugar once daily. 250.00   acetaminophen 500 MG tablet Commonly known as: TYLENOL Take 1,000 mg by mouth every 6 (six) hours as needed for mild pain.   albuterol 108 (90 Base) MCG/ACT inhaler Commonly known as: VENTOLIN HFA Inhale 2 puffs into the lungs every 6 (six) hours as needed for wheezing or shortness of breath.   ARIPiprazole 5 MG tablet Commonly known as: ABILIFY Take 5 mg by mouth daily.   atorvastatin 20 MG tablet Commonly known as: LIPITOR Take 20 mg by mouth daily.   blood glucose meter kit and supplies Kit Dispense based on patient and insurance preference. Use up to four times daily as directed.   buPROPion 300 MG 24 hr tablet Commonly known as: WELLBUTRIN XL Take 300 mg by mouth in the morning.   diclofenac Sodium 1 % Gel Commonly known as: VOLTAREN Apply 2-4 g topically 4 (four) times daily as needed (joint pain).   escitalopram 10 MG tablet Commonly known as: LEXAPRO Take 10 mg by mouth every morning.   FLUoxetine 20 MG capsule Commonly known as: PROZAC Take 20 mg by mouth every morning.   furosemide 20 MG tablet  Commonly known as: LASIX Take 1 tablet (20 mg total) by mouth daily.   gabapentin 600 MG tablet Commonly known as: NEURONTIN Take 0.5 tablets (300 mg total) by mouth 3 (three) times daily.   glucose blood test strip Commonly known as: Accu-Chek SmartView Check blood sugar once daily. 250.00   hydrOXYzine 25 MG tablet Commonly known as: ATARAX Take 25 mg by mouth every 4 (four) hours as needed for anxiety.   linagliptin 5 MG Tabs tablet Commonly known as: TRADJENTA Take 5 mg by mouth in the morning.   losartan 100 MG tablet Commonly known as: COZAAR Take 100 mg by mouth daily.   methocarbamol 500 MG tablet Commonly known as: ROBAXIN Take 1 tablet (500 mg total) by mouth every 8 (eight) hours as needed for muscle spasms.   metoprolol tartrate 50 MG tablet Commonly  known as: LOPRESSOR Take 1 tablet (50 mg total) by mouth 2 (two) times daily.   naloxone 4 MG/0.1ML Liqd nasal spray kit Commonly known as: NARCAN Place 1 spray into the nose as needed (accidental overdose).   omeprazole 10 MG capsule Commonly known as: PRILOSEC Take 10 mg by mouth daily.   oxyCODONE 15 MG immediate release tablet Commonly known as: ROXICODONE Take 0.5 tablets (7.5 mg total) by mouth every 8 (eight) hours as needed for pain.   Premarin 0.45 MG tablet Generic drug: estrogens (conjugated) Take 0.45 mg by mouth daily.   traZODone 100 MG tablet Commonly known as: DESYREL Take 2.5 tablets (250 mg total) by mouth at bedtime as needed for sleep. What changed: how much to take   warfarin 5 MG tablet Commonly known as: COUMADIN Take as directed. If you are unsure how to take this medication, talk to your nurse or doctor. Original instructions: TAKE 1 TABLET BY MOUTH DAILY OR AS DIRECTED BY ANTICOAGULATION CLINIC. What changed: See the new instructions.   XIIDRA OP Place 1 drop into both eyes daily.        Allergies  Allergen Reactions   Hydrocodone-Acetaminophen Itching   Hydrocodone Itching    Tolerates oxycodone     Consultations: Cardiology  Procedures:   Discharge Exam: BP (!) 160/94   Pulse (!) 115   Temp (P) 98.8 F (37.1 C)   Resp 18   Ht 5' 11" (1.803 m)   Wt 95.3 kg   LMP 12/06/1976   SpO2 95%   BMI 29.32 kg/m  Physical Exam Constitutional:      General: She is not in acute distress.    Appearance: Normal appearance. She is not ill-appearing.  HENT:     Head: Normocephalic and atraumatic.     Mouth/Throat:     Mouth: Mucous membranes are moist.  Eyes:     Extraocular Movements: Extraocular movements intact.  Cardiovascular:     Rate and Rhythm: Normal rate and regular rhythm.  Pulmonary:     Effort: Pulmonary effort is normal.     Breath sounds: Normal breath sounds.  Abdominal:     General: Bowel sounds are normal. There  is no distension.     Palpations: Abdomen is soft.     Tenderness: There is no abdominal tenderness.  Musculoskeletal:        General: Normal range of motion.     Cervical back: Normal range of motion and neck supple.  Skin:    General: Skin is warm and dry.  Neurological:     General: No focal deficit present.     Mental Status: She is  alert.  Psychiatric:        Mood and Affect: Mood normal.        Behavior: Behavior normal.      The results of significant diagnostics from this hospitalization (including imaging, microbiology, ancillary and laboratory) are listed below for reference.   Microbiology: No results found for this or any previous visit (from the past 240 hour(s)).   Labs: BNP (last 3 results) Recent Labs    12/08/21 0114 05/24/22 0327  BNP 705.8* 818.2*   Basic Metabolic Panel: Recent Labs  Lab 07/22/22 1852 07/22/22 1906 07/22/22 2037 07/23/22 0146 07/24/22 0322  NA 135 135  --  134* 136  K 6.7* 6.5* 5.7* 4.8 4.6  CL 105 106  --  105 109  CO2 19*  --   --  21* 22  GLUCOSE 191* 179*  --  79 103*  BUN 28* 28*  --  29* 21  CREATININE 2.33* 2.40*  --  2.08* 1.24*  CALCIUM 7.9*  --   --  8.3* 8.3*  MG  --   --   --  1.5* 1.6*   Liver Function Tests: Recent Labs  Lab 07/22/22 1852 07/23/22 0146  AST 29 40  ALT 16 19  ALKPHOS 54 60  BILITOT 0.7 0.6  PROT 6.1* 6.6  ALBUMIN 3.2* 3.5   No results for input(s): "LIPASE", "AMYLASE" in the last 168 hours. No results for input(s): "AMMONIA" in the last 168 hours. CBC: Recent Labs  Lab 07/22/22 1852 07/22/22 1906 07/23/22 0146 07/24/22 0322  WBC 10.2  --  8.9 5.1  NEUTROABS 6.4  --   --  3.3  HGB 9.0* 10.5* 9.7* 8.1*  HCT 29.3* 31.0* 30.2* 25.0*  MCV 90.2  --  87.8 85.9  PLT 190  --  178 163   Cardiac Enzymes: No results for input(s): "CKTOTAL", "CKMB", "CKMBINDEX", "TROPONINI" in the last 168 hours. BNP: Invalid input(s): "POCBNP" CBG: Recent Labs  Lab 07/23/22 1115 07/23/22 1710  07/23/22 2109 07/24/22 0640 07/24/22 1106  GLUCAP 98 146* 111* 98 126*   D-Dimer No results for input(s): "DDIMER" in the last 72 hours. Hgb A1c Recent Labs    07/22/22 2358  HGBA1C 6.0*   Lipid Profile No results for input(s): "CHOL", "HDL", "LDLCALC", "TRIG", "CHOLHDL", "LDLDIRECT" in the last 72 hours. Thyroid function studies Recent Labs    07/22/22 2358  TSH 2.434   Anemia work up No results for input(s): "VITAMINB12", "FOLATE", "FERRITIN", "TIBC", "IRON", "RETICCTPCT" in the last 72 hours. Urinalysis    Component Value Date/Time   COLORURINE YELLOW 07/23/2022 0109   APPEARANCEUR HAZY (A) 07/23/2022 0109   LABSPEC 1.010 07/23/2022 0109   PHURINE 5.0 07/23/2022 0109   GLUCOSEU NEGATIVE 07/23/2022 0109   GLUCOSEU NEG mg/dL 01/09/2009 2129   HGBUR NEGATIVE 07/23/2022 0109   HGBUR negative 02/27/2007 1327   BILIRUBINUR NEGATIVE 07/23/2022 0109   KETONESUR NEGATIVE 07/23/2022 0109   PROTEINUR NEGATIVE 07/23/2022 0109   UROBILINOGEN 1.0 06/10/2010 1729   NITRITE NEGATIVE 07/23/2022 0109   LEUKOCYTESUR NEGATIVE 07/23/2022 0109   Sepsis Labs Recent Labs  Lab 07/22/22 1852 07/23/22 0146 07/24/22 0322  WBC 10.2 8.9 5.1   Microbiology No results found for this or any previous visit (from the past 240 hour(s)).  Procedures/Studies: DG Chest Port 1 View  Result Date: 07/22/2022 CLINICAL DATA:  Back pain and bradycardia EXAM: PORTABLE CHEST 1 VIEW COMPARISON:  05/28/2022 FINDINGS: Cardiac shadow is within normal limits. Postsurgical changes are again seen. Lungs  are well aerated bilaterally. No acute bony abnormality is seen. IMPRESSION: No active disease. Electronically Signed   By: Inez Catalina M.D.   On: 07/22/2022 19:38     Time coordinating discharge: Over 30 minutes    Dwyane Dee, MD  [facility] 07/24/2022, 4:31 PM

## 2022-07-26 ENCOUNTER — Emergency Department (HOSPITAL_COMMUNITY): Payer: Medicare Other

## 2022-07-26 ENCOUNTER — Encounter (HOSPITAL_COMMUNITY): Payer: Self-pay

## 2022-07-26 ENCOUNTER — Other Ambulatory Visit: Payer: Self-pay

## 2022-07-26 ENCOUNTER — Inpatient Hospital Stay (HOSPITAL_COMMUNITY)
Admission: EM | Admit: 2022-07-26 | Discharge: 2022-07-31 | DRG: 917 | Disposition: A | Discharge status: Home / Self Care | Payer: Medicare Other | Attending: Internal Medicine | Admitting: Internal Medicine

## 2022-07-26 DIAGNOSIS — Y929 Unspecified place or not applicable: Secondary | ICD-10-CM

## 2022-07-26 DIAGNOSIS — Z20822 Contact with and (suspected) exposure to covid-19: Secondary | ICD-10-CM | POA: Diagnosis present

## 2022-07-26 DIAGNOSIS — R55 Syncope and collapse: Secondary | ICD-10-CM | POA: Diagnosis not present

## 2022-07-26 DIAGNOSIS — G4700 Insomnia, unspecified: Secondary | ICD-10-CM | POA: Diagnosis present

## 2022-07-26 DIAGNOSIS — E1140 Type 2 diabetes mellitus with diabetic neuropathy, unspecified: Secondary | ICD-10-CM | POA: Diagnosis present

## 2022-07-26 DIAGNOSIS — M797 Fibromyalgia: Secondary | ICD-10-CM | POA: Diagnosis present

## 2022-07-26 DIAGNOSIS — Z7901 Long term (current) use of anticoagulants: Secondary | ICD-10-CM

## 2022-07-26 DIAGNOSIS — J449 Chronic obstructive pulmonary disease, unspecified: Secondary | ICD-10-CM | POA: Diagnosis present

## 2022-07-26 DIAGNOSIS — K5521 Angiodysplasia of colon with hemorrhage: Secondary | ICD-10-CM | POA: Diagnosis present

## 2022-07-26 DIAGNOSIS — I480 Paroxysmal atrial fibrillation: Secondary | ICD-10-CM

## 2022-07-26 DIAGNOSIS — I161 Hypertensive emergency: Secondary | ICD-10-CM | POA: Diagnosis present

## 2022-07-26 DIAGNOSIS — Z9889 Other specified postprocedural states: Secondary | ICD-10-CM

## 2022-07-26 DIAGNOSIS — I4892 Unspecified atrial flutter: Secondary | ICD-10-CM | POA: Diagnosis present

## 2022-07-26 DIAGNOSIS — F17210 Nicotine dependence, cigarettes, uncomplicated: Secondary | ICD-10-CM | POA: Diagnosis present

## 2022-07-26 DIAGNOSIS — N179 Acute kidney failure, unspecified: Secondary | ICD-10-CM | POA: Diagnosis present

## 2022-07-26 DIAGNOSIS — Z952 Presence of prosthetic heart valve: Secondary | ICD-10-CM

## 2022-07-26 DIAGNOSIS — T402X1A Poisoning by other opioids, accidental (unintentional), initial encounter: Secondary | ICD-10-CM | POA: Diagnosis not present

## 2022-07-26 DIAGNOSIS — R001 Bradycardia, unspecified: Secondary | ICD-10-CM

## 2022-07-26 DIAGNOSIS — R7989 Other specified abnormal findings of blood chemistry: Secondary | ICD-10-CM | POA: Diagnosis present

## 2022-07-26 DIAGNOSIS — Z720 Tobacco use: Secondary | ICD-10-CM | POA: Diagnosis present

## 2022-07-26 DIAGNOSIS — Z8249 Family history of ischemic heart disease and other diseases of the circulatory system: Secondary | ICD-10-CM

## 2022-07-26 DIAGNOSIS — K921 Melena: Secondary | ICD-10-CM

## 2022-07-26 DIAGNOSIS — R42 Dizziness and giddiness: Secondary | ICD-10-CM | POA: Diagnosis not present

## 2022-07-26 DIAGNOSIS — I959 Hypotension, unspecified: Secondary | ICD-10-CM | POA: Diagnosis not present

## 2022-07-26 DIAGNOSIS — I5022 Chronic systolic (congestive) heart failure: Secondary | ICD-10-CM | POA: Diagnosis present

## 2022-07-26 DIAGNOSIS — Z79899 Other long term (current) drug therapy: Secondary | ICD-10-CM

## 2022-07-26 DIAGNOSIS — I442 Atrioventricular block, complete: Secondary | ICD-10-CM | POA: Diagnosis present

## 2022-07-26 DIAGNOSIS — D120 Benign neoplasm of cecum: Secondary | ICD-10-CM

## 2022-07-26 DIAGNOSIS — N1831 Chronic kidney disease, stage 3a: Secondary | ICD-10-CM | POA: Diagnosis present

## 2022-07-26 DIAGNOSIS — K5520 Angiodysplasia of colon without hemorrhage: Secondary | ICD-10-CM

## 2022-07-26 DIAGNOSIS — D62 Acute posthemorrhagic anemia: Secondary | ICD-10-CM | POA: Diagnosis present

## 2022-07-26 DIAGNOSIS — K219 Gastro-esophageal reflux disease without esophagitis: Secondary | ICD-10-CM | POA: Diagnosis present

## 2022-07-26 DIAGNOSIS — Z885 Allergy status to narcotic agent status: Secondary | ICD-10-CM

## 2022-07-26 DIAGNOSIS — E1122 Type 2 diabetes mellitus with diabetic chronic kidney disease: Secondary | ICD-10-CM | POA: Diagnosis present

## 2022-07-26 DIAGNOSIS — B182 Chronic viral hepatitis C: Secondary | ICD-10-CM | POA: Diagnosis present

## 2022-07-26 DIAGNOSIS — G894 Chronic pain syndrome: Secondary | ICD-10-CM | POA: Diagnosis present

## 2022-07-26 DIAGNOSIS — F32A Depression, unspecified: Secondary | ICD-10-CM | POA: Diagnosis present

## 2022-07-26 DIAGNOSIS — E7800 Pure hypercholesterolemia, unspecified: Secondary | ICD-10-CM | POA: Diagnosis present

## 2022-07-26 DIAGNOSIS — D122 Benign neoplasm of ascending colon: Secondary | ICD-10-CM

## 2022-07-26 DIAGNOSIS — E1169 Type 2 diabetes mellitus with other specified complication: Secondary | ICD-10-CM

## 2022-07-26 DIAGNOSIS — Z833 Family history of diabetes mellitus: Secondary | ICD-10-CM

## 2022-07-26 DIAGNOSIS — D649 Anemia, unspecified: Secondary | ICD-10-CM | POA: Diagnosis present

## 2022-07-26 DIAGNOSIS — B1920 Unspecified viral hepatitis C without hepatic coma: Secondary | ICD-10-CM | POA: Diagnosis present

## 2022-07-26 DIAGNOSIS — Z818 Family history of other mental and behavioral disorders: Secondary | ICD-10-CM

## 2022-07-26 DIAGNOSIS — K922 Gastrointestinal hemorrhage, unspecified: Secondary | ICD-10-CM | POA: Diagnosis present

## 2022-07-26 DIAGNOSIS — K640 First degree hemorrhoids: Secondary | ICD-10-CM | POA: Diagnosis present

## 2022-07-26 DIAGNOSIS — I130 Hypertensive heart and chronic kidney disease with heart failure and stage 1 through stage 4 chronic kidney disease, or unspecified chronic kidney disease: Secondary | ICD-10-CM | POA: Diagnosis present

## 2022-07-26 DIAGNOSIS — E119 Type 2 diabetes mellitus without complications: Secondary | ICD-10-CM

## 2022-07-26 DIAGNOSIS — D123 Benign neoplasm of transverse colon: Secondary | ICD-10-CM

## 2022-07-26 LAB — COMPREHENSIVE METABOLIC PANEL
ALT: 17 U/L (ref 0–44)
AST: 27 U/L (ref 15–41)
Albumin: 2.9 g/dL — ABNORMAL LOW (ref 3.5–5.0)
Alkaline Phosphatase: 60 U/L (ref 38–126)
Anion gap: 9 (ref 5–15)
BUN: 23 mg/dL (ref 8–23)
CO2: 20 mmol/L — ABNORMAL LOW (ref 22–32)
Calcium: 7.6 mg/dL — ABNORMAL LOW (ref 8.9–10.3)
Chloride: 105 mmol/L (ref 98–111)
Creatinine, Ser: 2.3 mg/dL — ABNORMAL HIGH (ref 0.44–1.00)
GFR, Estimated: 23 mL/min — ABNORMAL LOW (ref >60)
Glucose, Bld: 131 mg/dL — ABNORMAL HIGH (ref 70–99)
Potassium: 4.4 mmol/L (ref 3.5–5.1)
Sodium: 134 mmol/L — ABNORMAL LOW (ref 135–145)
Total Bilirubin: 0.4 mg/dL (ref 0.3–1.2)
Total Protein: 5.5 g/dL — ABNORMAL LOW (ref 6.5–8.1)

## 2022-07-26 LAB — PROTIME-INR
INR: 2.8 — ABNORMAL HIGH (ref 0.8–1.2)
Prothrombin Time: 29.4 seconds — ABNORMAL HIGH (ref 11.4–15.2)

## 2022-07-26 LAB — I-STAT CHEM 8, ED
BUN: 23 mg/dL (ref 8–23)
Calcium, Ion: 0.96 mmol/L — ABNORMAL LOW (ref 1.15–1.40)
Chloride: 106 mmol/L (ref 98–111)
Creatinine, Ser: 2.4 mg/dL — ABNORMAL HIGH (ref 0.44–1.00)
Glucose, Bld: 129 mg/dL — ABNORMAL HIGH (ref 70–99)
HCT: 25 % — ABNORMAL LOW (ref 36.0–46.0)
Hemoglobin: 8.5 g/dL — ABNORMAL LOW (ref 12.0–15.0)
Potassium: 4.3 mmol/L (ref 3.5–5.1)
Sodium: 135 mmol/L (ref 135–145)
TCO2: 19 mmol/L — ABNORMAL LOW (ref 22–32)

## 2022-07-26 LAB — CBC WITH DIFFERENTIAL/PLATELET
Abs Immature Granulocytes: 0.07 10*3/uL (ref 0.00–0.07)
Basophils Absolute: 0 10*3/uL (ref 0.0–0.1)
Basophils Relative: 0 %
Eosinophils Absolute: 0.1 10*3/uL (ref 0.0–0.5)
Eosinophils Relative: 1 %
HCT: 24 % — ABNORMAL LOW (ref 36.0–46.0)
Hemoglobin: 7.5 g/dL — ABNORMAL LOW (ref 12.0–15.0)
Immature Granulocytes: 1 %
Lymphocytes Relative: 25 %
Lymphs Abs: 1.3 10*3/uL (ref 0.7–4.0)
MCH: 28.3 pg (ref 26.0–34.0)
MCHC: 31.3 g/dL (ref 30.0–36.0)
MCV: 90.6 fL (ref 80.0–100.0)
Monocytes Absolute: 0.4 10*3/uL (ref 0.1–1.0)
Monocytes Relative: 8 %
Neutro Abs: 3.4 10*3/uL (ref 1.7–7.7)
Neutrophils Relative %: 65 %
Platelets: 201 10*3/uL (ref 150–400)
RBC: 2.65 MIL/uL — ABNORMAL LOW (ref 3.87–5.11)
RDW: 16.7 % — ABNORMAL HIGH (ref 11.5–15.5)
WBC: 5.3 10*3/uL (ref 4.0–10.5)
nRBC: 1.3 % — ABNORMAL HIGH (ref 0.0–0.2)

## 2022-07-26 LAB — PREPARE RBC (CROSSMATCH)

## 2022-07-26 LAB — TROPONIN I (HIGH SENSITIVITY)
Troponin I (High Sensitivity): 7 ng/L (ref <18)
Troponin I (High Sensitivity): 7 ng/L (ref <18)

## 2022-07-26 LAB — CBC
HCT: 30.5 % — ABNORMAL LOW (ref 36.0–46.0)
Hemoglobin: 9.4 g/dL — ABNORMAL LOW (ref 12.0–15.0)
MCH: 28.4 pg (ref 26.0–34.0)
MCHC: 30.8 g/dL (ref 30.0–36.0)
MCV: 92.1 fL (ref 80.0–100.0)
Platelets: 219 10*3/uL (ref 150–400)
RBC: 3.31 MIL/uL — ABNORMAL LOW (ref 3.87–5.11)
RDW: 16.6 % — ABNORMAL HIGH (ref 11.5–15.5)
WBC: 5.1 10*3/uL (ref 4.0–10.5)
nRBC: 1.8 % — ABNORMAL HIGH (ref 0.0–0.2)

## 2022-07-26 LAB — CBG MONITORING, ED
Glucose-Capillary: 129 mg/dL — ABNORMAL HIGH (ref 70–99)
Glucose-Capillary: 91 mg/dL (ref 70–99)

## 2022-07-26 LAB — POC OCCULT BLOOD, ED: Fecal Occult Bld: POSITIVE — AB

## 2022-07-26 LAB — BRAIN NATRIURETIC PEPTIDE: B Natriuretic Peptide: 699.1 pg/mL — ABNORMAL HIGH (ref 0.0–100.0)

## 2022-07-26 MED ORDER — ACETAMINOPHEN 650 MG RE SUPP: 650.0000 mg | Freq: Four times a day (QID) | RECTAL | Status: DC | PRN

## 2022-07-26 MED ORDER — ALBUTEROL SULFATE (2.5 MG/3ML) 0.083% IN NEBU: 3.0000 mL | INHALATION_SOLUTION | Freq: Four times a day (QID) | RESPIRATORY_TRACT | Status: DC | PRN

## 2022-07-26 MED ORDER — LACTATED RINGERS IV BOLUS
500.0000 mL | Freq: Once | INTRAVENOUS | Status: AC
  Administered 2022-07-26: 500 mL via INTRAVENOUS

## 2022-07-26 MED ORDER — ONDANSETRON HCL 4 MG PO TABS: 4.0000 mg | ORAL_TABLET | Freq: Four times a day (QID) | ORAL | Status: DC | PRN

## 2022-07-26 MED ORDER — PANTOPRAZOLE SODIUM 40 MG PO TBEC
40.0000 mg | DELAYED_RELEASE_TABLET | Freq: Every day | ORAL | Status: DC
  Administered 2022-07-27 – 2022-07-28 (×2): 40 mg via ORAL
  Filled 2022-07-26 (×2): qty 1

## 2022-07-26 MED ORDER — PANTOPRAZOLE 80MG IVPB - SIMPLE MED
80.0000 mg | Freq: Once | INTRAVENOUS | Status: AC
  Administered 2022-07-26: 80 mg via INTRAVENOUS
  Filled 2022-07-26: qty 80

## 2022-07-26 MED ORDER — TRAZODONE HCL 100 MG PO TABS
200.0000 mg | ORAL_TABLET | Freq: Every evening | ORAL | Status: DC | PRN
  Administered 2022-07-26 – 2022-07-27 (×2): 200 mg via ORAL
  Filled 2022-07-26: qty 4
  Filled 2022-07-26: qty 2

## 2022-07-26 MED ORDER — ARIPIPRAZOLE 5 MG PO TABS
5.0000 mg | ORAL_TABLET | Freq: Every day | ORAL | Status: DC
  Administered 2022-07-27 – 2022-07-28 (×2): 5 mg via ORAL
  Filled 2022-07-26 (×2): qty 1

## 2022-07-26 MED ORDER — LIFITEGRAST 5 % OP SOLN: 1.0000 [drp] | Freq: Every day | OPHTHALMIC | Status: DC

## 2022-07-26 MED ORDER — INSULIN ASPART 100 UNIT/ML IJ SOLN
0.0000 [IU] | Freq: Three times a day (TID) | INTRAMUSCULAR | Status: DC
  Administered 2022-07-26: 2 [IU] via SUBCUTANEOUS

## 2022-07-26 MED ORDER — ONDANSETRON HCL 4 MG/2ML IJ SOLN
4.0000 mg | Freq: Four times a day (QID) | INTRAMUSCULAR | Status: DC | PRN
  Administered 2022-07-28: 4 mg via INTRAVENOUS
  Filled 2022-07-26: qty 2

## 2022-07-26 MED ORDER — BUPROPION HCL ER (XL) 150 MG PO TB24
300.0000 mg | ORAL_TABLET | Freq: Every morning | ORAL | Status: DC
  Administered 2022-07-27 – 2022-07-28 (×2): 300 mg via ORAL
  Filled 2022-07-26: qty 1
  Filled 2022-07-26: qty 2

## 2022-07-26 MED ORDER — LACTATED RINGERS IV SOLN: INTRAVENOUS | Status: DC

## 2022-07-26 MED ORDER — SODIUM CHLORIDE 0.9% FLUSH
3.0000 mL | Freq: Two times a day (BID) | INTRAVENOUS | Status: DC
  Administered 2022-07-26 – 2022-07-28 (×4): 3 mL via INTRAVENOUS

## 2022-07-26 MED ORDER — ESCITALOPRAM OXALATE 10 MG PO TABS
10.0000 mg | ORAL_TABLET | Freq: Every morning | ORAL | Status: DC
  Administered 2022-07-27 – 2022-07-28 (×2): 10 mg via ORAL
  Filled 2022-07-26 (×2): qty 1

## 2022-07-26 MED ORDER — ACETAMINOPHEN 325 MG PO TABS: 650.0000 mg | ORAL_TABLET | Freq: Four times a day (QID) | ORAL | Status: DC | PRN

## 2022-07-26 MED ORDER — METHOCARBAMOL 500 MG PO TABS
500.0000 mg | ORAL_TABLET | Freq: Three times a day (TID) | ORAL | Status: DC | PRN
  Administered 2022-07-26 – 2022-07-28 (×4): 500 mg via ORAL
  Filled 2022-07-26 (×4): qty 1

## 2022-07-26 MED ORDER — NICOTINE 21 MG/24HR TD PT24
21.0000 mg | MEDICATED_PATCH | Freq: Every day | TRANSDERMAL | Status: DC
  Administered 2022-07-26 – 2022-07-28 (×3): 21 mg via TRANSDERMAL
  Filled 2022-07-26 (×3): qty 1

## 2022-07-26 MED ORDER — SODIUM CHLORIDE 0.9 % IV SOLN
10.0000 mL/h | Freq: Once | INTRAVENOUS | Status: AC
  Administered 2022-07-26: 10 mL/h via INTRAVENOUS

## 2022-07-26 MED ORDER — HYDROXYZINE HCL 25 MG PO TABS
25.0000 mg | ORAL_TABLET | ORAL | Status: DC | PRN
Start: 2022-07-26 — End: 2022-07-28

## 2022-07-26 MED ORDER — GABAPENTIN 600 MG PO TABS
300.0000 mg | ORAL_TABLET | Freq: Three times a day (TID) | ORAL | Status: DC
  Administered 2022-07-26 – 2022-07-28 (×5): 300 mg via ORAL
  Filled 2022-07-26 (×5): qty 1

## 2022-07-26 MED ORDER — OXYCODONE HCL 5 MG PO TABS
5.0000 mg | ORAL_TABLET | ORAL | Status: DC | PRN
  Administered 2022-07-26: 5 mg via ORAL
  Filled 2022-07-26: qty 1

## 2022-07-26 MED ORDER — ATORVASTATIN CALCIUM 10 MG PO TABS
20.0000 mg | ORAL_TABLET | Freq: Every day | ORAL | Status: DC
  Administered 2022-07-27 – 2022-07-28 (×2): 20 mg via ORAL
  Filled 2022-07-26 (×2): qty 2

## 2022-07-26 NOTE — Consult Note (Addendum)
Cardiology Consult Note:   Patient ID: [name] MRN: [id number]; DOB: [birth year]   Admission date: 07/26/2022  PCP:  Pcp, No   [facility] Providers Cardiologist:  Werner Lean, MD        Chief Complaint:  Syncope  Patient Profile:   [name] is a 61 y.o. female withwith a hx of valvular heart disease and paroxysmal atrial flutter who is being seen 07/22/2022 for the evaluation of Near syncope at the request of Dr. Lorin Mercy.  History of Present Illness:   [name] is a 61 year old woman who developed severe mitral and tricuspid valve regurgitation due to inflammatory disease (possibly rheumatic or postinfectious) and underwent mitral and tricuspid valve repair on 04/19/2022, subsequently complicated by large pericardial effusion with tamponade requiring pericardiocentesis on 05/10/2022, persistent atrial flutter, chronic warfarin anticoagulation, hypertension, type 2 diabetes mellitus  remote history of substance abuse, GERD, normal coronaries by previous angiography who was recently discharged in the setting of near syncope.   During this recent admission she was in complete heart block with a K of 6.7 and in the setting of some confusion with her medication.  Due to her getting medications from multiple pharmacies she was taking extra doses of medications; this was remedied at last visit and she has now been taking her medications as prescribed at discharge.  This AM she went to the store.  She did not take her medications this AM save for her pain medications (did not take her BB today).  She felt near syncopal and tried to get to a chair at the store today: she ended up hitting her head She was able to get home after resting for a bit.  A neighbor let her use her walker to make it inside. BIBEMS.  She reminds me that she has had this issues since long before her valve disease and her establishing with cardiology  Found to have rate controlled a fib rates  54- 69 in ED.   Given 1 L, 1 units PRBCs, improved BP. Patient has normt CT Head.  BNP elevated Has noted small amount of blood in her stool; GI was called.     Past Medical History:  Diagnosis Date   Acute pericardial effusion 05/17/2022   AKI (acute kidney injury) ([place]) 05/17/2022   Anxiety    Arthritis    Cervicalgia    Chondromalacia of patella    Chronic neck pain    Chronic pain syndrome    Depression    secondary to loss of her son at age 49   Diabetes mellitus    Diabetic neuropathy ([place])    DMII (diabetes mellitus, type 2) ([place])    Dysrhythmia    Enthesopathy of hip region    Fibromyalgia    GERD (gastroesophageal reflux disease)    Hep C w/o coma, chronic ([place])    Hepatitis C    diagnosed 2005   Hypertension    Insomnia    Insomnia    Low back pain    Lumbago    Mitral regurgitation    Obesity    Pain in joint, upper arm    Primary localized osteoarthrosis, lower leg    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   Substance abuse ([place])    sober since 2001   Tobacco abuse    Tricuspid regurgitation    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    Past Surgical History:  Procedure Laterality Date   ABDOMINAL HYSTERECTOMY  at age 42, unknown reasons   CARPAL TUNNEL RELEASE  12/07/2011   left side   EYE SURGERY     FOOT SURGERY Bilateral    MITRAL VALVE REPAIR N/A 04/19/2022   Procedure: MITRAL VALVE REPAIR WITH 30MM PHYSIO II ANNULOPLASTY RING;  Surgeon: Melrose Nakayama, MD;  Location: [place];  Service: Open Heart Surgery;  Laterality: N/A;   PERICARDIOCENTESIS N/A 05/10/2022   Procedure: PERICARDIOCENTESIS;  Surgeon: Jettie Booze, MD;  Location: [place] [facility];  Service: Cardiovascular;  Laterality: N/A;   RIGHT/LEFT HEART CATH AND CORONARY ANGIOGRAPHY N/A 02/12/2022   Procedure: RIGHT/LEFT HEART CATH AND CORONARY ANGIOGRAPHY;  Surgeon: Belva Crome, MD;  Location: [place] [facility];  Service: Cardiovascular;  Laterality:  N/A;   TEE WITHOUT CARDIOVERSION N/A 02/03/2022   Procedure: TRANSESOPHAGEAL ECHOCARDIOGRAM (TEE);  Surgeon: Josue Hector, MD;  Location: [facility] ENDOSCOPY;  Service: Cardiovascular;  Laterality: N/A;   TEE WITHOUT CARDIOVERSION N/A 04/19/2022   Procedure: TRANSESOPHAGEAL ECHOCARDIOGRAM (TEE);  Surgeon: Melrose Nakayama, MD;  Location: [place];  Service: Open Heart Surgery;  Laterality: N/A;   TRICUSPID VALVE REPLACEMENT N/A 04/19/2022   Procedure: TRICUSPID VALVE REPAIR WITH 32MM MC3 ANNULOPLASTY RING;  Surgeon: Melrose Nakayama, MD;  Location: [place];  Service: Open Heart Surgery;  Laterality: N/A;     Medications Prior to Admission: Prior to Admission medications   Medication Sig Start Date End Date Taking? Authorizing Provider  acetaminophen (TYLENOL) 500 MG tablet Take 1,000 mg by mouth every 6 (six) hours as needed for mild pain.   Yes [provider]  albuterol (PROVENTIL HFA;VENTOLIN HFA) 108 (90 Base) MCG/ACT inhaler Inhale 2 puffs into the lungs every 6 (six) hours as needed for wheezing or shortness of breath.    Yes [provider]  ARIPiprazole (ABILIFY) 5 MG tablet Take 5 mg by mouth daily. 07/20/22  Yes [provider]  atorvastatin (LIPITOR) 20 MG tablet Take 20 mg by mouth daily.   Yes [provider]  buPROPion (WELLBUTRIN XL) 300 MG 24 hr tablet Take 300 mg by mouth in the morning. 03/24/22  Yes [provider]  diclofenac Sodium (VOLTAREN) 1 % GEL Apply 2-4 g topically 4 (four) times daily as needed (joint pain). 05/10/22  Yes [provider]  escitalopram (LEXAPRO) 10 MG tablet Take 10 mg by mouth every morning. 07/08/22  Yes [provider]  FLUoxetine (PROZAC) 20 MG capsule Take 20 mg by mouth every morning. 04/14/22  Yes [provider]  furosemide (LASIX) 20 MG tablet Take 1 tablet (20 mg total) by mouth daily. 05/27/22  Yes [name], MD  gabapentin (NEURONTIN) 600 MG tablet Take 0.5 tablets (300 mg  total) by mouth 3 (three) times daily. 05/30/22  Yes [name], [name], MD  hydrOXYzine (ATARAX) 25 MG tablet Take 25 mg by mouth every 4 (four) hours as needed for anxiety.   Yes [provider]  Lifitegrast Shirley Friar OP) Place 1 drop into both eyes daily.   Yes [provider]  linagliptin (TRADJENTA) 5 MG TABS tablet Take 5 mg by mouth in the morning.   Yes [provider]  losartan (COZAAR) 100 MG tablet Take 100 mg by mouth daily. 07/06/22  Yes [provider]  methocarbamol (ROBAXIN) 500 MG tablet Take 1 tablet (500 mg total) by mouth every 8 (eight) hours as needed for muscle spasms. 05/26/22  Yes [name], MD  metoprolol tartrate (LOPRESSOR) 50 MG tablet Take 1  tablet (50 mg total) by mouth 2 (two) times daily. 05/17/22  Yes [name], NP  omeprazole (PRILOSEC) 10 MG capsule Take 10 mg by mouth daily. 07/02/22  Yes [provider]  oxyCODONE (ROXICODONE) 15 MG immediate release tablet Take 0.5 tablets (7.5 mg total) by mouth every 8 (eight) hours as needed for pain. 05/30/22  Yes [name], [name], MD  PREMARIN 0.45 MG tablet Take 0.45 mg by mouth daily. 01/11/22  Yes [provider]  traZODone (DESYREL) 100 MG tablet Take 2.5 tablets (250 mg total) by mouth at bedtime as needed for sleep. Patient taking differently: Take 200 mg by mouth at bedtime as needed for sleep. 11/23/18  Yes [name], MD  warfarin (COUMADIN) 5 MG tablet TAKE 1 TABLET BY MOUTH DAILY OR AS DIRECTED BY ANTICOAGULATION CLINIC. Patient taking differently: Take 5 mg by mouth daily. Take 1 tablet by mouth daily and 1.5 on sundays 07/02/22  Yes Manuelita Moxon, Terisa Starr, MD  ACCU-CHEK FASTCLIX LANCETS MISC 1 each by Does not apply route daily. Check blood sugar once daily. 250.00 10/26/11   [name], DO  blood glucose meter kit and supplies KIT Dispense based on patient and insurance preference. Use up to four times daily as directed. 05/26/22   [name], MD  glucose blood (ACCU-CHEK SMARTVIEW) test strip Check blood sugar once daily. 250.00 10/26/11 12/30/21  [name], DO  naloxone [facility]) nasal spray 4 mg/0.1 mL Place 1 spray into the nose as needed (accidental overdose).    [provider]     Allergies:    Allergies  Allergen Reactions   Hydrocodone-Acetaminophen Itching   Hydrocodone Itching    Tolerates oxycodone     Social History:   Social History   Socioeconomic History   Marital status: Single    Spouse name: Not on file   Number of children: 2   Years of education: Not on file   Highest education level: Not on file  Occupational History   Occupation: CNA    Comment: worked for 15 years   Occupation: disability  Tobacco Use   Smoking status: Every Day    Packs/day: 0.25    Years: 35.00    Total pack years: 8.75    Types: Cigarettes   Smokeless tobacco: Never   Tobacco comments:    4 cigarettes a day  Vaping Use   Vaping Use: Some days  Substance and Sexual Activity   Alcohol use: No   Drug use: No   Sexual activity: Not Currently    Comment: Celebate since 2000  Other Topics Concern   Not on file  Social History Narrative   Born and raised by mom until mom died when she was 14yo. After that she stayed with her aunt. Dad was married to someone else and was never around.  Pt has 1 brother and 1 sister and pt is the middle child. Pt had a very rough childhood. Never married. 2 kids one son died several  yrs ago and other son has Schizophrenia. Pt has graduated HS and has some college. Pt is on disability and is unemployed. 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       Financial assistance approved for 100% discount at [facility] and has [facility] card   [employer]  September 29, 2010 2:27 PM   Social Determinants of Health   Financial Resource Strain: Not on file  Food Insecurity: Not on file  Transportation Needs:  Not on file  Physical Activity: Not on file  Stress: Not on file  Social  Connections: Not on file  Intimate Partner Violence: Not on file    Family History:   The patient's family history includes Alcohol abuse in her brother, father, and sister; Arthritis in an other family member; Diabetes in an other family member; Drug abuse in her brother and sister; Heart attack (age of onset: 42) in her son; Hypertension in an other family member; Schizophrenia in her son; Uterine cancer in her mother.    ROS:  Please see the history of present illness.  All other ROS reviewed and negative.     Physical Exam/Data:   Vitals:   07/26/22 1500 07/26/22 1515 07/26/22 1530 07/26/22 1625  BP: (!) 86/35 95/66 108/64 99/82  Pulse: 60 60 (!) 58 61  Resp: 13 16 (!) 8 14  Temp:    (!) 97.5 F (36.4 C)  TempSrc:    Oral  SpO2: 96% 94% 96% 95%    Intake/Output Summary (Last 24 hours) at 07/26/2022 1737 Last data filed at 07/26/2022 1627 Gross per 24 hour  Intake 1587.58 ml  Output --  Net 1587.58 ml      07/24/2022    6:00 AM 07/24/2022   12:57 AM 07/23/2022   12:56 AM  Last 3 Weights  Weight (lbs) 210 lb 3.2 oz 207 lb 10.8 oz 207 lb 12.8 oz  Weight (kg) 95.346 kg 94.2 kg 94.257 kg     There is no height or weight on file to calculate BMI.  General:  Well nourished, well developed, in no acute distress HEENT: normal Neck: no JVD Vascular: No carotid bruits; Distal pulses 2+ bilaterally   Cardiac:  normal S1, S2; RRR; systolic and diastolic murmurs Lungs:  clear to auscultation bilaterally, no wheezing, rhonchi or rales  Abd: soft, nontender, no hepatomegaly  Ext: no edema Musculoskeletal:  No deformities, BUE and BLE strength normal and equal Skin: warm and dry  Neuro:  CNs 2-12 intact, no focal abnormalities noted Psych:  Normal affect    EKG:  The ECG that was done  was personally reviewed and demonstrates AF rate 54  Relevant CV Studies:   ECHO COMPLETE WO IMAGING ENHANCING AGENT 05/31/2022  Narrative ECHOCARDIOGRAM REPORT    Patient Name:    JOYLENE WESCOTT Date of Exam: 05/31/2022 Medical Rec #:  [id number]         Height:       71.0 in Accession #:    [id number]        Weight:       211.0 lb Date of Birth:  July 04, 1961         BSA:          2.157 m Patient Age:    40 years          BP:           85/64 mmHg Patient Gender: F                 HR:           79 bpm. Exam Location:  [facility]  Procedure: 2D Echo, Cardiac Doppler and Color Doppler  Indications:    I34.0 MR  History:        Patient has prior history of Echocardiogram examinations, most recent 05/24/2022. S/p MVR, Signs/Symptoms:Pericardial effusion; Risk Factors:Diabetes.  Sonographer:    Coralyn Helling RDCS Referring Phys: [place]   1. Basal  septal and basal inferolateral hypokinesis.. Left ventricular ejection fraction, by estimation, is 50 to 55%. The left ventricle has low normal function. There is mild concentric left ventricular hypertrophy. 2. Right ventricular systolic function is moderately reduced. The right ventricular size is normal. 3. Left atrial size was moderately dilated. 4. S/p MV repair with 30 mm annuloplasty ring (04/27/22). Peak and mean gradients through the mitral valve are 27 and 15 mm Hg respectively. (HR 81 bpm) MVA by continuity equation is 1.4 cm2 consistent with severe MS. Compared to previous echo gradients are increased. . The mitral valve has been repaired/replaced. Mild mitral valve regurgitation. 5. S/p TV repair with annuloplasty ring. TR jet is eccentric, directed toward interatrial septum.. The tricuspid valve is has been repaired/replaced. Tricuspid valve regurgitation is mild to moderate. 6. The aortic valve is tricuspid. Aortic valve regurgitation is trivial. Aortic valve sclerosis/calcification is present, without any evidence of aortic stenosis. 7. The inferior vena cava is normal in size with greater than 50% respiratory variability, suggesting right atrial pressure of 3  mmHg.  FINDINGS Left Ventricle: Basal septal and basal inferolateral hypokinesis. Left ventricular ejection fraction, by estimation, is 50 to 55%. The left ventricle has low normal function. The left ventricular internal cavity size was normal in size. There is mild concentric left ventricular hypertrophy.  Right Ventricle: The right ventricular size is normal. Right vetricular wall thickness was not assessed. Right ventricular systolic function is moderately reduced.  Left Atrium: Left atrial size was moderately dilated.  Right Atrium: Right atrial size was normal in size.  Pericardium: There is no evidence of pericardial effusion.  Mitral Valve: S/p MV repair with 30 mm annuloplasty ring (04/27/22). Peak and mean gradients through the mitral valve are 27 and 15 mm Hg respectively. (HR 81 bpm) MVA by continuity equation is 1.4 cm2 consistent with severe MS. Compared to previous echo gradients are increased. The mitral valve has been repaired/replaced. Mild mitral valve regurgitation. MV peak gradient, 26.6 mmHg. The mean mitral valve gradient is 15.0 mmHg.  Tricuspid Valve: S/p TV repair with annuloplasty ring. TR jet is eccentric, directed toward interatrial septum. The tricuspid valve is has been repaired/replaced. Tricuspid valve regurgitation is mild to moderate.  Aortic Valve: The aortic valve is tricuspid. Aortic valve regurgitation is trivial. Aortic valve sclerosis/calcification is present, without any evidence of aortic stenosis.  Pulmonic Valve: The pulmonic valve was normal in structure. Pulmonic valve regurgitation is trivial.  Aorta: The aortic root and ascending aorta are structurally normal, with no evidence of dilitation.  Venous: The inferior vena cava is normal in size with greater than 50% respiratory variability, suggesting right atrial pressure of 3 mmHg.  IAS/Shunts: No atrial level shunt detected by color flow Doppler.   LEFT VENTRICLE PLAX 2D LVIDd:          4.80 cm LVIDs:         3.50 cm LV PW:         1.20 cm LV IVS:        1.40 cm LVOT diam:     2.20 cm LV SV:         86 LV SV Index:   40 LVOT Area:     3.80 cm   RIGHT VENTRICLE             IVC RV S prime:     12.40 cm/s  IVC diam: 1.70 cm TAPSE (M-mode): 1.0 cm RVSP:           54.3 mmHg  LEFT ATRIUM              Index        RIGHT ATRIUM           Index LA diam:        4.50 cm  2.09 cm/m   RA Pressure: 3.00 mmHg LA Vol (A2C):   119.0 ml 55.17 ml/m  RA Area:     17.80 cm LA Vol (A4C):   88.0 ml  40.80 ml/m  RA Volume:   55.90 ml  25.91 ml/m LA Biplane Vol: 104.0 ml 48.21 ml/m AORTIC VALVE LVOT Vmax:   112.00 cm/s LVOT Vmean:  64.400 cm/s LVOT VTI:    0.227 m  AORTA Ao Root diam: 3.50 cm Ao Asc diam:  3.60 cm  MITRAL VALVE                TRICUSPID VALVE MV Area (PHT): 2.62 cm     TR Peak grad:   51.3 mmHg MV Area VTI:   1.39 cm     TR Vmax:        358.00 cm/s MV Peak grad:  26.6 mmHg    Estimated RAP:  3.00 mmHg MV Mean grad:  15.0 mmHg    RVSP:           54.3 mmHg MV Vmax:       2.58 m/s MV Vmean:      186.0 cm/s   SHUNTS MV Decel Time: 290 msec     Systemic VTI:  0.23 m MV E velocity: 226.00 cm/s  Systemic Diam: 2.20 cm MV A velocity: 211.00 cm/s MV E/A ratio:  1.07  Dorris Carnes MD Electronically signed by Dorris Carnes MD Signature Date/Time: 05/31/2022/7:57:23 PM    Final   ECHO LIMITED WO IMAGE ENHANCING AGENT 05/24/2022  Narrative ECHOCARDIOGRAM LIMITED REPORT    Patient Name:   GERRIANNE AYDELOTT Date of Exam: 05/24/2022 Medical Rec #:  [id number]         Height:       71.0 in Accession #:    [id number]        Weight:       209.0 lb Date of Birth:  July 16, 1961         BSA:          2.148 m Patient Age:    68 years          BP:           152/91 mmHg Patient Gender: F                 HR:           65 bpm. Exam Location:  Inpatient  Procedure: Limited Echo, Color Doppler and Cardiac Doppler  Indications:    I31.3 Pericardial effusion  History:         Patient has prior history of Echocardiogram examinations, most recent 05/13/2022. CHF, Arrythmias:Atrial Fibrillation; Risk Factors:Hypertension, Diabetes and Dyslipidemia. 04/19/22 MV and TV ring repairs.  Sonographer:    Raquel Sarna Senior RDCS Referring Phys: [place]   1. Left ventricular ejection fraction, by estimation, is 50 to 55%. The left ventricle has low normal function. The left ventricle demonstrates regional wall motion abnormalities (see scoring diagram/findings for description). There is mild concentric left ventricular hypertrophy. There is hypokinesis of the left ventricular, basal-mid inferior wall. 2. Right ventricular systolic function is moderately reduced. There is moderately elevated pulmonary artery systolic pressure. 3. Left atrial size  was severely dilated. 4. Right atrial size was moderately dilated. 5. The mitral valve has been repaired/replaced. Mild mitral valve regurgitation. Mild to moderate mitral stenosis. The mean mitral valve gradient is 9.0 mmHg. 6. The tricuspid valve is has been repaired/replaced. Tricuspid valve regurgitation is mild. 8. The inferior vena cava is dilated in size with <50% respiratory variability, suggesting right atrial pressure of 15 mmHg. 9. No pericardial effusion.  FINDINGS Left Ventricle: Left ventricular ejection fraction, by estimation, is 50 to 55%. The left ventricle has low normal function. The left ventricle demonstrates regional wall motion abnormalities. There is mild concentric left ventricular hypertrophy.  Right Ventricle: Right ventricular systolic function is moderately reduced. There is moderately elevated pulmonary artery systolic pressure. The tricuspid regurgitant velocity is 3.15 m/s, and with an assumed right atrial pressure of 15 mmHg, the estimated right ventricular systolic pressure is 59.5 mmHg.  Left Atrium: Left atrial size was severely dilated.  Right Atrium: Right atrial size  was moderately dilated.  Mitral Valve: The mitral valve has been repaired/replaced. Mild mitral valve regurgitation. 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 Mild to moderate mitral valve stenosis. MV peak gradient, 19.8 mmHg. The mean mitral valve gradient is 9.0 mmHg.  Tricuspid Valve: The tricuspid valve is has been repaired/replaced. Tricuspid valve regurgitation is mild.  Pulmonic Valve: Pulmonic valve regurgitation is trivial.  Venous: The inferior vena cava is dilated in size with less than 50% respiratory variability, suggesting right atrial pressure of 15 mmHg.  AORTIC VALVE LVOT Vmax:   85.50 cm/s LVOT Vmean:  53.900 cm/s LVOT VTI:    0.166 m  MITRAL VALVE              TRICUSPID VALVE MV Peak grad: 19.8 mmHg   TV Peak grad:   4.0 mmHg MV Mean grad: 9.0 mmHg    TV Mean grad:   3.0 mmHg MV Vmax:      2.22 m/s    TV Vmax:        1.00 m/s MV Vmean:     141.3 cm/s  TV Vmean:       83.1 cm/s MR Peak grad: 104.4 mmHg  TV VTI:         0.26 msec MR Mean grad: 76.0 mmHg   TR Peak grad:   39.7 mmHg MR Vmax:      511.00 cm/s TR Vmax:        315.00 cm/s MR Vmean:     420.0 cm/s SHUNTS Systemic VTI: 0.17 m  Glori Bickers MD Electronically signed by Glori Bickers MD Signature Date/Time: 05/24/2022/4:00:46 PM    Final     Laboratory Data:  High Sensitivity Troponin:   Recent Labs  Lab 07/22/22 1852 07/22/22 2052 07/26/22 1115  TROPONINIHS 11 9 7       Chemistry Recent Labs  Lab 07/23/22 0146 07/24/22 0322 07/26/22 1115 07/26/22 1149  NA 134* 136 134* 135  K 4.8 4.6 4.4 4.3  CL 105 109 105 106  CO2 21* 22 20*  --   GLUCOSE 79 103* 131* 129*  BUN 29* 21 23 23   CREATININE 2.08* 1.24* 2.30* 2.40*  CALCIUM 8.3* 8.3* 7.6*  --   MG 1.5* 1.6*  --   --   GFRNONAA 26* 49* 23*  --   ANIONGAP 8 5 9   --     Recent Labs  Lab 07/23/22 0146 07/26/22 1115  PROT 6.6 5.5*  ALBUMIN 3.5 2.9*  AST 40 27  ALT  19 17  ALKPHOS 60 60   BILITOT 0.6 0.4   Lipids No results for input(s): "CHOL", "TRIG", "HDL", "LABVLDL", "LDLCALC", "CHOLHDL" in the last 168 hours. Hematology Recent Labs  Lab 07/24/22 0322 07/26/22 1115 07/26/22 1149  WBC 5.1 5.3  --   RBC 2.91* 2.65*  --   HGB 8.1* 7.5* 8.5*  HCT 25.0* 24.0* 25.0*  MCV 85.9 90.6  --   MCH 27.8 28.3  --   MCHC 32.4 31.3  --   RDW 16.0* 16.7*  --   PLT 163 201  --    Thyroid  Recent Labs  Lab 07/22/22 2358  TSH 2.434   BNP Recent Labs  Lab 07/26/22 1115  BNP 699.1*    DDimer No results for input(s): "DDIMER" in the last 168 hours.   Radiology/Studies:  DG Chest Port 1 View  Result Date: 07/26/2022 CLINICAL DATA:  Syncope EXAM: PORTABLE CHEST 1 VIEW COMPARISON:  07/22/2022 FINDINGS: Transverse diameter of heart is increased. Central pulmonary vessels are more prominent. Increased interstitial markings are seen in the parahilar regions and lower lung fields. There is no focal consolidation. There is no pleural effusion or pneumothorax. 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 IMPRESSION: Central pulmonary vessels are more prominent. Increased interstitial markings are seen in the parahilar regions and lower lung fields. Findings suggest CHF with interstitial pulmonary edema. Electronically Signed   By: Elmer Picker M.D.   On: 07/26/2022 12:49   CT Head Wo Contrast  Result Date: 07/26/2022 CLINICAL DATA:  Head trauma, coagulopathy, syncopal episode striking head, history MVR, d type II iabetes mellitus, hypertension, smoker EXAM: CT HEAD WITHOUT CONTRAST TECHNIQUE: Contiguous axial images were obtained from the base of the skull through the vertex without intravenous contrast. RADIATION DOSE REDUCTION: This exam was performed according to the departmental dose-optimization program which includes automated exposure control, adjustment of the mA and/or kV according to patient size and/or use of iterative reconstruction technique.  COMPARISON:  05/22/2022 FINDINGS: Brain: Normal ventricular morphology. No midline shift or mass effect. Normal appearance of brain parenchyma. No intracranial hemorrhage, mass lesion, evidence of acute infarction, or extra-axial fluid collection. Few scattered nonspecific dural calcifications unchanged. Vascular: No hyperdense vessels. Skull: Intact Sinuses/Orbits: Clear Other: N/A IMPRESSION: No acute intracranial abnormalities. Electronically Signed   By: Lavonia Dana M.D.   On: 07/26/2022 12:27     Assessment and Plan:   Syncope Hypotension Bradycardia with paroxsymal atrial fibrillation CHADSVASC NA Inflammatory/rheumatic heart disease S/p MVR and TVR both annuloplasty rings - This doesn't not appears to be Bradycardia mediated; we will hold BB and monitor on telemetry; if we restart BB this admission would monitor on telemetry or use a live heart monitor - she has had very high gradients post surgery, at last her PHT was < 130 ms, and her TVI was greater than 2.2 suggestive of pathologic regurgitation; we will do limited TTE for prosthetic tricuspid and mitral valve and may end up doing a TEE on Wednesday based on her clinical course - when she is able per GI she will need to be back on her warfarin - we will continue to follow  I have consented her for TEE for Wednesday; she is tentatively on the schedule barring non-cardiac discoveries leading to her syncope  For questions or updates, please contact [facility] Please consult [email or web address] for contact info under     Signed, Werner Lean, MD  07/26/2022 5:37 PM

## 2022-07-26 NOTE — ED Provider Notes (Signed)
[place] EMERGENCY DEPARTMENT Provider Note   CSN: [id number] Arrival date & time: 07/26/22  1106   History  Chief Complaint  Patient presents with   Hypotension   Loss of Consciousness   Weakness   [name] is a 61 y.o. female past medical history A-fib/flutter on Coumadin, pericardial effusion s/p pericardiocentesis this 05/10/22, type 2 diabetes mellitus, hypertension, GERD who presented with dizziness and syncopal episode.  Patient brought in by EMS, contributed history.  Briefly, the patient was walking around the store when she felt dizzy and passed out.  She says that she "hit the floor".  She believes she was out for a few seconds.  She then got in her car, drove home, and had to rest her head between her knees for a few minutes.  At this point, the neighbor called 911.  She denies any antecedent or current chest pain, difficulty breathing, nausea, vomiting, abdominal pain, diaphoresis.  Denies any fever, cough, congestion.  Patient does report seeing "a few spots of bright red blood" with her stool over the last few days.  Of note, patient was recently hospitalized for symptomatic bradycardia with initial systolic blood pressure in the 50s and heart rate in the 20s.  This presentation was subsequently believed to be due to diltiazem, which was discontinued at the time of discharge 2 days ago.  Patient reports that she has not taken this medication "that she knows of", but reports that she may have mixed up a pill or two in her pillbox, because she "does not know what it looks like, and takes 30 medications".  Home Medications Prior to Admission medications   Medication Sig Start Date End Date Taking? Authorizing Provider  ACCU-CHEK FASTCLIX LANCETS MISC 1 each by Does not apply route daily. Check blood sugar once daily. 250.00 10/26/11   [name], DO  acetaminophen (TYLENOL) 500 MG tablet Take 1,000 mg by mouth every 6 (six) hours as needed for  mild pain.    [provider]  albuterol (PROVENTIL HFA;VENTOLIN HFA) 108 (90 Base) MCG/ACT inhaler Inhale 2 puffs into the lungs every 6 (six) hours as needed for wheezing or shortness of breath.     [provider]  ARIPiprazole (ABILIFY) 5 MG tablet Take 5 mg by mouth daily. 07/20/22   [provider]  atorvastatin (LIPITOR) 20 MG tablet Take 20 mg by mouth daily.    [provider]  blood glucose meter kit and supplies KIT Dispense based on patient and insurance preference. Use up to four times daily as directed. 05/26/22   [name], MD  buPROPion (WELLBUTRIN XL) 300 MG 24 hr tablet Take 300 mg by mouth in the morning. 03/24/22   [provider]  diclofenac Sodium (VOLTAREN) 1 % GEL Apply 2-4 g topically 4 (four) times daily as needed (joint pain). 05/10/22   [provider]  escitalopram (LEXAPRO) 10 MG tablet Take 10 mg by mouth every morning. 07/08/22   [provider]  FLUoxetine (PROZAC) 20 MG capsule Take 20 mg by mouth every morning. 04/14/22   [provider]  furosemide (LASIX) 20 MG tablet Take 1 tablet (20 mg total) by mouth daily. 05/27/22   [name], MD  gabapentin (NEURONTIN) 600 MG tablet Take 0.5 tablets (300 mg total) by mouth 3 (three) times daily. 05/30/22   [name], [name], MD  glucose blood (ACCU-CHEK SMARTVIEW) test strip Check blood sugar once daily. 250.00 10/26/11 12/30/21  [name],  DO  hydrOXYzine (ATARAX) 25 MG tablet Take 25 mg by mouth every 4 (four) hours as needed for anxiety.    [provider]  Lifitegrast [facility]) Place 1 drop into both eyes daily.    [provider]  linagliptin (TRADJENTA) 5 MG TABS tablet Take 5 mg by mouth in the morning.    [provider]  losartan (COZAAR) 100 MG tablet Take 100 mg by mouth daily. 07/06/22   [provider]  methocarbamol (ROBAXIN) 500 MG tablet Take 1 tablet (500 mg total) by mouth every 8 (eight)  hours as needed for muscle spasms. 05/26/22   [name], MD  metoprolol tartrate (LOPRESSOR) 50 MG tablet Take 1 tablet (50 mg total) by mouth 2 (two) times daily. 05/17/22   [name], NP  naloxone [facility]) nasal spray 4 mg/0.1 mL Place 1 spray into the nose as needed (accidental overdose).    [provider]  omeprazole (PRILOSEC) 10 MG capsule Take 10 mg by mouth daily. 07/02/22   [provider]  oxyCODONE (ROXICODONE) 15 MG immediate release tablet Take 0.5 tablets (7.5 mg total) by mouth every 8 (eight) hours as needed for pain. 05/30/22   [name], [name], MD  PREMARIN 0.45 MG tablet Take 0.45 mg by mouth daily. 01/11/22   [provider]  traZODone (DESYREL) 100 MG tablet Take 2.5 tablets (250 mg total) by mouth at bedtime as needed for sleep. Patient taking differently: Take 200 mg by mouth at bedtime as needed for sleep. 11/23/18   [name], MD  warfarin (COUMADIN) 5 MG tablet TAKE 1 TABLET BY MOUTH DAILY OR AS DIRECTED BY ANTICOAGULATION CLINIC. Patient taking differently: Take 5 mg by mouth daily. Take 1 tablet by mouth daily and 1.5 on sundays 07/02/22   Werner Lean, MD      Allergies    Hydrocodone-acetaminophen and Hydrocodone    Review of Systems   Review of Systems As in HPI.  Physical Exam Updated Vital Signs Pulse (!) 55   Temp 98.5 F (36.9 C) (Oral)   Resp 20   LMP 12/06/1976   Physical Exam Vitals and nursing note reviewed.  Constitutional:      General: She is not in acute distress.    Appearance: Normal appearance. She is well-developed. She is not ill-appearing or diaphoretic.  HENT:     Head: Normocephalic and atraumatic.     Right Ear: External ear normal.     Left Ear: External ear normal.     Nose: Nose normal.     Mouth/Throat:     Mouth: Mucous membranes are moist.  Cardiovascular:     Rate and Rhythm: Normal rate and regular rhythm.     Heart sounds: No murmur heard.    Comments: Well-healed  midline surgical scar. Pulmonary:     Effort: Pulmonary effort is normal. No respiratory distress.     Breath sounds: Normal breath sounds.  Abdominal:     General: There is no distension.     Palpations: Abdomen is soft.     Tenderness: There is no abdominal tenderness. There is no guarding.  Genitourinary:    Rectum: Normal. Guaiac result positive.  Musculoskeletal:     Cervical back: Neck supple.     Right lower leg: No edema.     Left lower leg: No edema.  Skin:    General: Skin is warm and dry.  Neurological:     Mental Status: She is alert.  ED Results / Procedures / Treatments   Labs (all labs ordered are listed, but only abnormal results are displayed) Labs Reviewed - No data to display  EKG EKG Interpretation  Date/Time:  Monday July 26 2022 11:14:45 EDT Ventricular Rate:  54 PR Interval:    QRS Duration: 96 QT Interval:  467 QTC Calculation: 443 R Axis:   35 Text Interpretation: Atrial fibrillation Low voltage, precordial leads Confirmed by Dene Gentry [phone]) on 07/26/2022 11:23:51 AM  Radiology DG Chest Port 1 View  Result Date: 07/26/2022 CLINICAL DATA:  Syncope EXAM: PORTABLE CHEST 1 VIEW COMPARISON:  07/22/2022 FINDINGS: Transverse diameter of heart is increased. Central pulmonary vessels are more prominent. Increased interstitial markings are seen in the parahilar regions and lower lung fields. There is no focal consolidation. There is no pleural effusion or pneumothorax. 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 IMPRESSION: Central pulmonary vessels are more prominent. Increased interstitial markings are seen in the parahilar regions and lower lung fields. Findings suggest CHF with interstitial pulmonary edema. Electronically Signed   By: Elmer Picker M.D.   On: 07/26/2022 12:49   CT Head Wo Contrast  Result Date: 07/26/2022 CLINICAL DATA:  Head trauma, coagulopathy, syncopal episode striking head, history MVR, d  type II iabetes mellitus, hypertension, smoker EXAM: CT HEAD WITHOUT CONTRAST TECHNIQUE: Contiguous axial images were obtained from the base of the skull through the vertex without intravenous contrast. RADIATION DOSE REDUCTION: This exam was performed according to the departmental dose-optimization program which includes automated exposure control, adjustment of the mA and/or kV according to patient size and/or use of iterative reconstruction technique. COMPARISON:  05/22/2022 FINDINGS: Brain: Normal ventricular morphology. No midline shift or mass effect. Normal appearance of brain parenchyma. No intracranial hemorrhage, mass lesion, evidence of acute infarction, or extra-axial fluid collection. Few scattered nonspecific dural calcifications unchanged. Vascular: No hyperdense vessels. Skull: Intact Sinuses/Orbits: Clear Other: N/A IMPRESSION: No acute intracranial abnormalities. Electronically Signed   By: Lavonia Dana M.D.   On: 07/26/2022 12:27    Procedures .Critical Care  Performed by: Faylene Million, MD Authorized by: Valarie Merino, MD   Critical care provider statement:    Critical care time (minutes):  45   Critical care time was exclusive of:  Separately billable procedures and treating other patients   Critical care was necessary to treat or prevent imminent or life-threatening deterioration of the following conditions:  Cardiac failure and circulatory failure   Critical care was time spent personally by me on the following activities:  Development of treatment plan with patient or surrogate, discussions with consultants, evaluation of patient's response to treatment, examination of patient, obtaining history from patient or surrogate, ordering and performing treatments and interventions, ordering and review of laboratory studies, ordering and review of radiographic studies, pulse oximetry, re-evaluation of patient's condition and review of old charts    Medications Ordered in  ED Medications - No data to display  ED Course/ Medical Decision Making/ A&P                           Medical Decision Making Amount and/or Complexity of Data Reviewed Labs: ordered. Radiology: ordered.  Risk Prescription drug management. Decision regarding hospitalization.   [name] is a 61 y.o. female with significant PMHx A-fib/flutter on Coumadin, pericardial effusion s/p pericardiocentesis 05/10/22, type 2 diabetes mellitus, hypertension, GERD who presented to ED via EMS with dizziness and syncopal episode.  Initial vitals demonstrate bradycardia,  with pulse 48.  Patient hypotensive with systolic 21F.  However, patient resting comfortably in bed, no acute distress, able to converse in full sentences, appears to be mentating appropriately.  Physical exam nonfocal.  Normal heart sounds.  Lungs clear to auscultation bilaterally.  Abdomen is soft, nontender to palpation.  No pitting edema.  Initial differential includes but is not limited to: Symptomatic anemia, GI bleed, electrolyte derangement, arrhythmia, vasovagal syncope, ACS, N/STEMI, CHF, pneumonia, pleural effusion, pneumothorax  Lab work obtained, resulted notable for CBC without leukocytosis.  Hemoglobin 7.5, decreased from 8.1 two days prior and 9.7 three days prior.  Concern for ongoing GI losses in the setting of bright red blood in stool.  Rectal exam performed, negative for gross blood or melena.  Hemoccult obtained, resulted positive.  In the setting of suspected ongoing loss and hypotension, patient ordered for 1 unit packed RBCs.  Risks and benefits of blood transfusion were discussed at length at bedside, and all questions answered to the best of my ability.  CMP notable for creatinine 2.3, up from 1.24 at time of discharge two days prior. INR 2.8. Initial troponin 7. Repeat pending.  BNP 699.1, decreased from prior lab work.  Imaging was pursued.  Patient is on Coumadin and had a fall.  CT head obtained, resulted  negative for acute intracranial normalities per radiology read.  Chest x-ray obtained, notable for prominent central pulmonary vessels and increased interstitial edema concerning for CHF, per radiology read.  EKG obtained, demonstrates atrial fibrillation, ventricular rate 54 bpm.  No evidence of abnormal intervals or acute ischemia.  Interpreted by myself and my attending.  Interventions include IV fluid bolus of 500 cc LR x2, 1 unit PRBCs, IV Protonix  Patient had partial response to IV fluids, which were provided at gentle rate due to patient's history of CHF.  I am concerned that continuing to give the patient fluids may further dilute her hemoglobin, and worsen her dizziness.  Patient was ordered for 1 unit packed red cells.  Following this, patient's blood pressure significantly improved with systolics consistently greater than 100.  Patient has multiple potential etiologies of her presentation today, including decreased p.o. intake vs worsening cardiorenal function in the setting of AKI with CHF, arrhythmia in the setting of persistent bradycardia, and GI bleed with downtrending hemoglobin and positive Hemoccult.  GI consult was placed.  We will admit the patient to hospitalist service for symptomatic dizziness, hypotension, downtrending hemoglobin requiring transfusion.  The plan for this patient was discussed with Dr. Francia Greaves, who voiced agreement and who oversaw evaluation and treatment of this patient.    Final Clinical Impression(s) / ED Diagnoses Final diagnoses:  Syncope and collapse    Rx / DC Orders ED Discharge Orders     None         Faylene Million, MD 07/26/22 1525    Valarie Merino, MD 07/27/22 [phone]

## 2022-07-26 NOTE — H&P (Signed)
History and Physical    Patient: [name] GUY:[id number] DOB: [birth year] DOA: 07/26/2022 DOS: the patient was seen and examined on 07/26/2022 PCP: Pcp, No  Patient coming from: Home; NOK: [place], Siharath, [phone]   Chief Complaint: Dizziness  HPI: [name] is a 61 y.o. female with medical history significant of afib on Coumadin; MVR/TVR (04/19/22); pericardial effusion s/p pericardiocentesis (05/10/22); DM; HTN; and chronic pain who was admitted from 8/17-19 with bradycardia and AKI presenting with syncope. She reports having mild persistent dizziness after dc which significantly worsened this AM. She drove to the grocery and "blacked out" while walking in - although she doesn't think she fully lost consciousness.  She gathered herself and drove home again.  She was very dizzy in the driveway and a neighbor had to help her into the house and called 911.  She does acknowledge 1 bloody BM yesterday.  She reports that she had BRBPR on the outside of one stool yesterday and no BM since.    ER Course:  Here recently with sx bradycardia - on epi drip, dc [place].  Another episode of dizziness and syncope.  Hypotensive here, SBP 70s, HR 50s.  Giving gentle IVF, 1L.  BP now stabilized.     Review of Systems: As mentioned in the history of present illness. All other systems reviewed and are negative. Past Medical History:  Diagnosis Date   Acute pericardial effusion 05/17/2022   AKI (acute kidney injury) ([place]) 05/17/2022   Anxiety    Arthritis    Cervicalgia    Chondromalacia of patella    Chronic neck pain    Chronic pain syndrome    Depression    secondary to loss of her son at age 65   Diabetes mellitus    Diabetic neuropathy ([place])    DMII (diabetes mellitus, type 2) ([place])    Dysrhythmia    Enthesopathy of hip region    Fibromyalgia    GERD (gastroesophageal reflux disease)    Hep C w/o coma, chronic ([place])    Hepatitis C    diagnosed 2005   Hypertension     Insomnia    Insomnia    Low back pain    Lumbago    Mitral regurgitation    Obesity    Pain in joint, upper arm    Primary localized osteoarthrosis, lower leg    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   Substance abuse ([place])    sober since 2001   Tobacco abuse    Tricuspid regurgitation    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   Past Surgical History:  Procedure Laterality Date   ABDOMINAL HYSTERECTOMY     at age 89, unknown reasons   CARPAL TUNNEL RELEASE  12/07/2011   left side   EYE SURGERY     FOOT SURGERY Bilateral    MITRAL VALVE REPAIR N/A 04/19/2022   Procedure: MITRAL VALVE REPAIR WITH 30MM PHYSIO II ANNULOPLASTY RING;  Surgeon: Melrose Nakayama, MD;  Location: [place];  Service: Open Heart Surgery;  Laterality: N/A;   PERICARDIOCENTESIS N/A 05/10/2022   Procedure: PERICARDIOCENTESIS;  Surgeon: Jettie Booze, MD;  Location: [place] [facility];  Service: Cardiovascular;  Laterality: N/A;   RIGHT/LEFT HEART CATH AND CORONARY ANGIOGRAPHY N/A 02/12/2022   Procedure: RIGHT/LEFT HEART CATH AND CORONARY ANGIOGRAPHY;  Surgeon: Belva Crome, MD;  Location: [place] [facility];  Service: Cardiovascular;  Laterality: N/A;   TEE WITHOUT CARDIOVERSION N/A 02/03/2022   Procedure: TRANSESOPHAGEAL  ECHOCARDIOGRAM (TEE);  Surgeon: Josue Hector, MD;  Location: [facility];  Service: Cardiovascular;  Laterality: N/A;   TEE WITHOUT CARDIOVERSION N/A 04/19/2022   Procedure: TRANSESOPHAGEAL ECHOCARDIOGRAM (TEE);  Surgeon: Melrose Nakayama, MD;  Location: [place];  Service: Open Heart Surgery;  Laterality: N/A;   TRICUSPID VALVE REPLACEMENT N/A 04/19/2022   Procedure: TRICUSPID VALVE REPAIR WITH 32MM MC3 ANNULOPLASTY RING;  Surgeon: Melrose Nakayama, MD;  Location: [place] [facility];  Service: Open Heart Surgery;  Laterality: N/A;   Social History:  reports that she has been smoking cigarettes. She has a 8.75 pack-year smoking history. She has never used smokeless  tobacco. She reports that she does not drink alcohol and does not use drugs.  Allergies  Allergen Reactions   Hydrocodone-Acetaminophen Itching   Hydrocodone Itching    Tolerates oxycodone     Family History  Problem Relation Age of Onset   Uterine cancer Mother    Alcohol abuse Father    Alcohol abuse Sister    Drug abuse Sister    Drug abuse Brother    Alcohol abuse Brother    Schizophrenia Son    Diabetes Other    Arthritis Other    Hypertension Other    Heart attack Son 98       Died suddenly    Prior to Admission medications   Medication Sig Start Date End Date Taking? Authorizing Provider  ACCU-CHEK FASTCLIX LANCETS MISC 1 each by Does not apply route daily. Check blood sugar once daily. 250.00 10/26/11   [name], DO  acetaminophen (TYLENOL) 500 MG tablet Take 1,000 mg by mouth every 6 (six) hours as needed for mild pain.    [provider]  albuterol (PROVENTIL HFA;VENTOLIN HFA) 108 (90 Base) MCG/ACT inhaler Inhale 2 puffs into the lungs every 6 (six) hours as needed for wheezing or shortness of breath.     [provider]  ARIPiprazole (ABILIFY) 5 MG tablet Take 5 mg by mouth daily. 07/20/22   [provider]  atorvastatin (LIPITOR) 20 MG tablet Take 20 mg by mouth daily.    [provider]  blood glucose meter kit and supplies KIT Dispense based on patient and insurance preference. Use up to four times daily as directed. 05/26/22   [name], MD  buPROPion (WELLBUTRIN XL) 300 MG 24 hr tablet Take 300 mg by mouth in the morning. 03/24/22   [provider]  diclofenac Sodium (VOLTAREN) 1 % GEL Apply 2-4 g topically 4 (four) times daily as needed (joint pain). 05/10/22   [provider]  escitalopram (LEXAPRO) 10 MG tablet Take 10 mg by mouth every morning. 07/08/22   [provider]  FLUoxetine (PROZAC) 20 MG capsule Take 20 mg by mouth every morning. 04/14/22   [provider]  furosemide  (LASIX) 20 MG tablet Take 1 tablet (20 mg total) by mouth daily. 05/27/22   [name], MD  gabapentin (NEURONTIN) 600 MG tablet Take 0.5 tablets (300 mg total) by mouth 3 (three) times daily. 05/30/22   [name], [name], MD  glucose blood (ACCU-CHEK SMARTVIEW) test strip Check blood sugar once daily. 250.00 10/26/11 12/30/21  [name], DO  hydrOXYzine (ATARAX) 25 MG tablet Take 25 mg by mouth every 4 (four) hours as needed for anxiety.    [provider]  Lifitegrast [facility]) Place 1 drop into both eyes daily.    [provider]  linagliptin (TRADJENTA) 5 MG TABS tablet Take 5  mg by mouth in the morning.    [provider]  losartan (COZAAR) 100 MG tablet Take 100 mg by mouth daily. 07/06/22   [provider]  methocarbamol (ROBAXIN) 500 MG tablet Take 1 tablet (500 mg total) by mouth every 8 (eight) hours as needed for muscle spasms. 05/26/22   [name], MD  metoprolol tartrate (LOPRESSOR) 50 MG tablet Take 1 tablet (50 mg total) by mouth 2 (two) times daily. 05/17/22   [name], NP  naloxone [facility]) nasal spray 4 mg/0.1 mL Place 1 spray into the nose as needed (accidental overdose).    [provider]  omeprazole (PRILOSEC) 10 MG capsule Take 10 mg by mouth daily. 07/02/22   [provider]  oxyCODONE (ROXICODONE) 15 MG immediate release tablet Take 0.5 tablets (7.5 mg total) by mouth every 8 (eight) hours as needed for pain. 05/30/22   [name], [name], MD  PREMARIN 0.45 MG tablet Take 0.45 mg by mouth daily. 01/11/22   [provider]  traZODone (DESYREL) 100 MG tablet Take 2.5 tablets (250 mg total) by mouth at bedtime as needed for sleep. Patient taking differently: Take 200 mg by mouth at bedtime as needed for sleep. 11/23/18   [name], MD  warfarin (COUMADIN) 5 MG tablet TAKE 1 TABLET BY MOUTH DAILY OR AS DIRECTED BY ANTICOAGULATION CLINIC. Patient taking differently: Take 5 mg by mouth daily. Take  1 tablet by mouth daily and 1.5 on sundays 07/02/22   Werner Lean, MD    Physical Exam: Vitals:   07/26/22 1500 07/26/22 1515 07/26/22 1530 07/26/22 1625  BP: (!) 86/35 95/66 108/64 99/82  Pulse: 60 60 (!) 58 61  Resp: 13 16 (!) 8 14  Temp:    (!) 97.5 F (36.4 C)  TempSrc:    Oral  SpO2: 96% 94% 96% 95%   General:  Appears calm and comfortable and is in NAD; she was in the process of reciting a very long poem about cocaine upon my entrance into the room and she continued reciting it for about 5 minutes continuously, not allowing for any interruption Eyes:  PERRL, EOMI, normal lids, iris ENT:  grossly normal hearing, lips & tongue, mmm; some absent dentition Neck:  no LAD, masses or thyromegaly Cardiovascular:  RR with bradycardia, no m/r/g. No LE edema.  Respiratory:   CTA bilaterally with no wheezes/rales/rhonchi.  Normal respiratory effort. Abdomen:  soft, NT, ND Skin:  no rash or induration seen on limited exam Musculoskeletal:  grossly normal tone BUE/BLE, good ROM, no bony abnormality Psychiatric:  eccentric mood and affect, speech fluent and appropriate, AOx3 Neurologic:  CN 2-12 grossly intact, moves all extremities in coordinated fashion   Radiological Exams on Admission: Independently reviewed - see discussion in A/P where applicable  DG Chest Port 1 View  Result Date: 07/26/2022 CLINICAL DATA:  Syncope EXAM: PORTABLE CHEST 1 VIEW COMPARISON:  07/22/2022 FINDINGS: Transverse diameter of heart is increased. Central pulmonary vessels are more prominent. Increased interstitial markings are seen in the parahilar regions and lower lung fields. There is no focal consolidation. There is no pleural effusion or pneumothorax. 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 IMPRESSION: Central pulmonary vessels are more prominent. Increased interstitial markings are seen in the parahilar regions and lower lung fields. Findings suggest CHF with  interstitial pulmonary edema. Electronically Signed   By: Elmer Picker M.D.   On: 07/26/2022 12:49   CT Head Wo Contrast  Result Date:  07/26/2022 CLINICAL DATA:  Head trauma, coagulopathy, syncopal episode striking head, history MVR, d type II iabetes mellitus, hypertension, smoker EXAM: CT HEAD WITHOUT CONTRAST TECHNIQUE: Contiguous axial images were obtained from the base of the skull through the vertex without intravenous contrast. RADIATION DOSE REDUCTION: This exam was performed according to the departmental dose-optimization program which includes automated exposure control, adjustment of the mA and/or kV according to patient size and/or use of iterative reconstruction technique. COMPARISON:  05/22/2022 FINDINGS: Brain: Normal ventricular morphology. No midline shift or mass effect. Normal appearance of brain parenchyma. No intracranial hemorrhage, mass lesion, evidence of acute infarction, or extra-axial fluid collection. Few scattered nonspecific dural calcifications unchanged. Vascular: No hyperdense vessels. Skull: Intact Sinuses/Orbits: Clear Other: N/A IMPRESSION: No acute intracranial abnormalities. Electronically Signed   By: Lavonia Dana M.D.   On: 07/26/2022 12:27    EKG: Independently reviewed.  AFIB with rate 54; no evidence of acute ischemia   Labs on Admission: I have personally reviewed the available labs and imaging studies at the time of the admission.  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   Chronic systolic CHF (congestive heart failure) (HCC)   Normocytic anemia    Dizziness -Patient with recent admission for bradycardia and hypotension, thought to be related to medication misadventure associated with diltiazem -Diltiazem was discontinued at the time of hospital dc (2 days ago) but she was continued on metoprolol -She returned today with persistent and then worsened dizziness with near syncope -She was quite hypotensive repeatedly in the ER but this has improved with IVF -Will observe for now on progressive care, hold all nodal blocking agents, and repeat limited echo  AKI on stage 3a CKD -Recurrent AKI -Possibly related to ATN in the setting of recurrent bradycardia and hypotension -Her BP was fluid-responsive so prerenal azotemia may also be contributing -She has been appropriately bolused and is now normotensive so will hold further IVF -Hold Cozaar, consider dc based on recurrent episodes of AKI  Anemia -Baseline Hgb is thought to be around 10 -Current Hgb is 7.5 -Given her h/o valvular heart disease, her Hgb goal should be >8 -She is receiving 1 unit PRBC at this time -Will repeat CBC tonight and in AM -She was heme positive and GI has been consulted; by her history, this sounds more c/w hemorrhoidal bleeding  Atrial fibrillation  -Will hold Coumadin given concern for rectal bleeding -Goal INR 2-3 and she is currently at goal -Will not plan to reverse at this time -She is bradycardic and so metoprolol is being held   Chronic systolic heart failure -Last echo 05/31/2022 showed EF 50 to 55% -No signs or symptoms of exacerbation at this time despite CXR with ?edema and she is hypoxic and on [place] O2 -Given clinical euvolemia but mixed picture with AKI, will plan to hold both Lasix and further IVF for now and follow clinically -Prior echo also showed severe MS with worsening gradients s/p  prior MV/TV repair -Will  repeat with limited echo -Cardiology consulted   Hypertension -Hold beta blocker due to bradycardia, ACE due to AKI -Previously had diltiazem stopped -Hypotensive on presentation -Will add pr IV hydralazine for now   Chronic pain syndrome -I have reviewed this patient in the  Controlled Substances Reporting System.  She is receiving medications from only one provider and appears to be taking them as prescribed. -She is at extraordinarily high risk of opioid misuse, diversion, or overdose.  -Continue Oxycodone at reduced dose (5 mg q4h prn severe pain), methocarbamol without further escalation of controlled medications   DM -Recent A1c was 5.4, indicating good control -hold Tradjenta -Cover with moderate-scale SSI  -Continue gabapentin  COPD -Continues to smoke; patch ordered -Continue albuterol prn  Mental health disorder -Continue Abilify, Wellbutrin, Lexapro, hydroxyzine, trazodone -Hold Prozac - she should not be taking both Lexapro and Prozac in addition to Wellbutrin due to the risk of serotonin syndrome  HLD -Continue Lipitor   Advance Care Planning:   Code Status: Full Code   Consults: Cardiology; GI; PCCM (telephone only); PT/OT  DVT Prophylaxis: SCDs  Family Communication: None present; she is able to communicate with family at this time  Severity of Illness: The appropriate patient status for this patient is OBSERVATION. Observation status is judged to be reasonable and necessary in order to provide the required intensity of service to ensure the patient's safety. The patient's presenting symptoms, physical exam findings, and initial radiographic and laboratory data in the context of their medical condition is felt to place them at decreased risk for further clinical deterioration. Furthermore, it is anticipated that the patient will be medically stable for discharge from the hospital within 2 midnights of admission.   Author: Karmen Bongo, MD 07/26/2022 6:10  PM  For on call review [email or web address].

## 2022-07-26 NOTE — Discharge Instructions (Addendum)
Restart coumadin on Monday, 8/28

## 2022-07-27 ENCOUNTER — Observation Stay (HOSPITAL_BASED_OUTPATIENT_CLINIC_OR_DEPARTMENT_OTHER): Payer: Medicare Other

## 2022-07-27 DIAGNOSIS — D62 Acute posthemorrhagic anemia: Secondary | ICD-10-CM | POA: Diagnosis not present

## 2022-07-27 DIAGNOSIS — R55 Syncope and collapse: Secondary | ICD-10-CM | POA: Diagnosis not present

## 2022-07-27 DIAGNOSIS — I480 Paroxysmal atrial fibrillation: Secondary | ICD-10-CM | POA: Diagnosis not present

## 2022-07-27 DIAGNOSIS — I959 Hypotension, unspecified: Secondary | ICD-10-CM | POA: Diagnosis not present

## 2022-07-27 DIAGNOSIS — I342 Nonrheumatic mitral (valve) stenosis: Secondary | ICD-10-CM | POA: Diagnosis not present

## 2022-07-27 DIAGNOSIS — R001 Bradycardia, unspecified: Secondary | ICD-10-CM | POA: Diagnosis not present

## 2022-07-27 DIAGNOSIS — I5022 Chronic systolic (congestive) heart failure: Secondary | ICD-10-CM | POA: Diagnosis not present

## 2022-07-27 DIAGNOSIS — K921 Melena: Secondary | ICD-10-CM

## 2022-07-27 DIAGNOSIS — Z7901 Long term (current) use of anticoagulants: Secondary | ICD-10-CM | POA: Diagnosis not present

## 2022-07-27 DIAGNOSIS — G894 Chronic pain syndrome: Secondary | ICD-10-CM

## 2022-07-27 DIAGNOSIS — E7800 Pure hypercholesterolemia, unspecified: Secondary | ICD-10-CM

## 2022-07-27 DIAGNOSIS — R42 Dizziness and giddiness: Secondary | ICD-10-CM | POA: Diagnosis not present

## 2022-07-27 LAB — GLUCOSE, CAPILLARY
Glucose-Capillary: 85 mg/dL (ref 70–99)
Glucose-Capillary: 112 mg/dL — ABNORMAL HIGH (ref 70–99)
Glucose-Capillary: 83 mg/dL (ref 70–99)

## 2022-07-27 LAB — BASIC METABOLIC PANEL
Anion gap: 6 (ref 5–15)
BUN: 19 mg/dL (ref 8–23)
CO2: 22 mmol/L (ref 22–32)
Calcium: 7.6 mg/dL — ABNORMAL LOW (ref 8.9–10.3)
Chloride: 109 mmol/L (ref 98–111)
Creatinine, Ser: 1.62 mg/dL — ABNORMAL HIGH (ref 0.44–1.00)
GFR, Estimated: 36 mL/min — ABNORMAL LOW (ref >60)
Glucose, Bld: 107 mg/dL — ABNORMAL HIGH (ref 70–99)
Potassium: 4.2 mmol/L (ref 3.5–5.1)
Sodium: 137 mmol/L (ref 135–145)

## 2022-07-27 LAB — BPAM RBC
Blood Product Expiration Date: 202309192359
ISSUE DATE / TIME: 202308211423
Unit Type and Rh: 7300

## 2022-07-27 LAB — TYPE AND SCREEN
ABO/RH(D): B POS
Antibody Screen: NEGATIVE
Unit division: 0

## 2022-07-27 LAB — CBC
HCT: 29.4 % — ABNORMAL LOW (ref 36.0–46.0)
Hemoglobin: 9.4 g/dL — ABNORMAL LOW (ref 12.0–15.0)
MCH: 28.7 pg (ref 26.0–34.0)
MCHC: 32 g/dL (ref 30.0–36.0)
MCV: 89.9 fL (ref 80.0–100.0)
Platelets: 204 10*3/uL (ref 150–400)
RBC: 3.27 MIL/uL — ABNORMAL LOW (ref 3.87–5.11)
RDW: 16.5 % — ABNORMAL HIGH (ref 11.5–15.5)
WBC: 6.2 10*3/uL (ref 4.0–10.5)
nRBC: 1.1 % — ABNORMAL HIGH (ref 0.0–0.2)

## 2022-07-27 LAB — ECHOCARDIOGRAM LIMITED
MV M vel: 6.11 m/s
MV Peak grad: 149.3 mmHg
MV VTI: 1.53 cm2

## 2022-07-27 LAB — PROTIME-INR
INR: 2.5 — ABNORMAL HIGH (ref 0.8–1.2)
Prothrombin Time: 27.1 seconds — ABNORMAL HIGH (ref 11.4–15.2)

## 2022-07-27 LAB — CBG MONITORING, ED: Glucose-Capillary: 95 mg/dL (ref 70–99)

## 2022-07-27 MED ORDER — SODIUM CHLORIDE 0.9 % IV SOLN: INTRAVENOUS | Status: DC

## 2022-07-27 MED ORDER — OXYCODONE HCL 5 MG PO TABS
15.0000 mg | ORAL_TABLET | Freq: Three times a day (TID) | ORAL | Status: DC | PRN
  Administered 2022-07-27 – 2022-07-28 (×3): 15 mg via ORAL
  Filled 2022-07-27 (×3): qty 3

## 2022-07-27 MED ORDER — POLYVINYL ALCOHOL 1.4 % OP SOLN
1.0000 [drp] | OPHTHALMIC | Status: DC | PRN
Start: 2022-07-27 — End: 2022-07-28

## 2022-07-27 NOTE — Evaluation (Signed)
Physical Therapy Evaluation Patient Details Name: [name] MRN: [id number] DOB: December 31, 1960 Today's Date: 07/27/2022  History of Present Illness  Pt is a 61 y/o female admitted secondary to dizziness and syncope. PMH includes tricuspid valve repair 04/19/2022, tamponade s/p pericardiocentesis on 05/10/2022, afib, HTN, HLD, GERD, hepatitis C, chronic pain, CHF, afib  Clinical Impression  Pt admitted secondary to problem above with deficits below. Pt requiring min guard A for mobility tasks. Reporting increased pain in L knee and feelings that it was going to buckle. Improved comfort with use of RW. Discussed outpatient PT follow up, however, pt reports she has tried PT and it doesn't seem to help. Will continue to follow acutely to maximize functional mobility independence and safety.        Recommendations for follow up therapy are one component of a multi-disciplinary discharge planning process, led by the attending physician.  Recommendations may be updated based on patient status, additional functional criteria and insurance authorization.  Follow Up Recommendations No PT follow up (pt reports she does not feel she needs)      Assistance Recommended at Discharge Intermittent Supervision/Assistance  Patient can return home with the following  Assistance with cooking/housework    Equipment Recommendations None recommended by PT  Recommendations for Other Services       Functional Status Assessment Patient has had a recent decline in their functional status and demonstrates the ability to make significant improvements in function in a reasonable and predictable amount of time.     Precautions / Restrictions Precautions Precautions: Fall Restrictions Weight Bearing Restrictions: No      Mobility  Bed Mobility Overal bed mobility: Modified Independent             General bed mobility comments: Increased time.    Transfers Overall transfer level: Needs  assistance Equipment used: None Transfers: Sit to/from Stand Sit to Stand: Min guard           General transfer comment: Min guard for safety.    Ambulation/Gait Ambulation/Gait assistance: Min guard Gait Distance (Feet): 100 Feet Assistive device: None, Rolling walker (2 wheels) Gait Pattern/deviations: Step-through pattern, Decreased stride length, Decreased weight shift to left Gait velocity: Decreased     General Gait Details: Slow, antalgic gait. Min guard for safety. Pt reports increased comfort with use of RW. Reporting L knee feels like it will give way, but did not note buckling  Stairs            Wheelchair Mobility    Modified Rankin (Stroke Patients Only)       Balance Overall balance assessment: Mild deficits observed, not formally tested                                           Pertinent Vitals/Pain Pain Assessment Pain Assessment: 0-10 Pain Score: 8  Pain Location: back, neck, arms, hips Pain Descriptors / Indicators: Grimacing, Guarding Pain Intervention(s): Limited activity within patient's tolerance, Monitored during session, Repositioned    Home Living Family/patient expects to be discharged to:: Private residence Living Arrangements: Alone Available Help at Discharge: Available PRN/intermittently Type of Home: Apartment Home Access: Level entry       Home Layout: One level Home Equipment: Rollator (4 wheels);Cane - single point;BSC/3in1;Wheelchair - manual;Grab bars - tub/shower;Shower seat;Tub bench;Hand held shower head      Prior Function Prior Level of Function : Independent/Modified Independent;Driving  Mobility Comments: USing rollator most of the time; uses WC when feeling dizzy       Hand Dominance        Extremity/Trunk Assessment   Upper Extremity Assessment Upper Extremity Assessment: Defer to OT evaluation    Lower Extremity Assessment Lower Extremity Assessment: LLE  deficits/detail LLE Deficits / Details: Increased knee pain and reports L knee feels like it may buckle    Cervical / Trunk Assessment Cervical / Trunk Assessment: Normal  Communication      Cognition Arousal/Alertness: Awake/alert Behavior During Therapy: WFL for tasks assessed/performed Overall Cognitive Status: Within Functional Limits for tasks assessed                                          General Comments      Exercises     Assessment/Plan    PT Assessment Patient needs continued PT services  PT Problem List Decreased strength;Decreased activity tolerance;Decreased balance;Decreased mobility       PT Treatment Interventions DME instruction;Gait training;Functional mobility training;Therapeutic activities;Therapeutic exercise;Cognitive remediation    PT Goals (Current goals can be found in the Care Plan section)  Acute Rehab PT Goals Patient Stated Goal: to go home PT Goal Formulation: With patient Time For Goal Achievement: 08/10/22 Potential to Achieve Goals: Good    Frequency Min 3X/week     Co-evaluation               AM-PAC PT "6 Clicks" Mobility  Outcome Measure Help needed turning from your back to your side while in a flat bed without using bedrails?: None Help needed moving from lying on your back to sitting on the side of a flat bed without using bedrails?: None Help needed moving to and from a bed to a chair (including a wheelchair)?: A Little Help needed standing up from a chair using your arms (e.g., wheelchair or bedside chair)?: A Little Help needed to walk in hospital room?: A Little Help needed climbing 3-5 steps with a railing? : A Little 6 Click Score: 20    End of Session Equipment Utilized During Treatment: Gait belt Activity Tolerance: Patient tolerated treatment well Patient left: in bed;with call bell/phone within reach (on stretcher in ED) Nurse Communication: Mobility status PT Visit Diagnosis: Other  abnormalities of gait and mobility (R26.89);Difficulty in walking, not elsewhere classified (R26.2);Pain Pain - Right/Left: Left Pain - part of body: Knee (back)    Time: 9983-3825 PT Time Calculation (min) (ACUTE ONLY): 15 min   Charges:   PT Evaluation $PT Eval Low Complexity: 1 Low          Reuel Derby, PT, DPT  Acute Rehabilitation Services  Office: [phone]   Rudean Hitt 07/27/2022, 2:32 PM

## 2022-07-27 NOTE — Progress Notes (Signed)
  Echocardiogram 2D Echocardiogram has been performed.  [name] 07/27/2022, 11:57 AM

## 2022-07-27 NOTE — Consult Note (Addendum)
Consultation  Referring Provider: Dr. Karleen Hampshire Primary Care Physician:  Pcp, No Primary Rawlins, MD  [facility]       Reason for Consultation: GI bleed             HPI:   [name] is a 61 y.o. female with a past medical history significant for A-fib on Coumadin, MVR/TVR 04/19/2022, pericardial effusion status post pericardiocentesis (05/10/2022), diabetes, hypertension and chronic pain, who was initially admitted from 8/17-8/19 with bradycardia and AKI who returns to the ER 8/21 for syncope.  We are consulted in regards to hematochezia.    Per admitting physician patient reported having mild persistent dizziness after she was discharged which worsened yesterday morning, she drove to the grocery store and "blacked out" while walking in.  She then called 911.  At time of admission she also described 1 bloody bowel movement on 07/25/2022, described bright red blood on the outside of 1 stool and had not had a bowel movement since.    Today, the patient tells me that admittedly she does not normally "cut the light on" when she has a bowel movement but 2 days ago in the morning when she had a bowel movement she did notice that there were some bright red blood on the stool.  She has not had a bowel movement since then and denies any other GI complaints or concerns.  Acknowledges her history of anemia in the past and multiple procedures to work this up.  Tells me she does have occasional constipation about once a month which requires an over-the-counter laxative but otherwise is pretty normal GI wise.    Denies fever, chills, weight loss, change in bowel habits, rectal pain, abdominal pain, heartburn, reflux, nausea or vomiting.  ER course: Hypotensive, SBP 70s, heart rate 50s, hemoglobin 7.5--> 8.5--> 9.4, BMP within normal BUN  GI history: 08/06/2010 colonoscopy for screening with for 3-6 mm polyps in the ascending colon and two 4-5 mm polyps removed from the sigmoid colon  as well as internal hemorrhoids; pathology showed tubular adenomas and repeat recommended in 5 years 10/26/2017 small bowel pill capsule endoscopy at [place] (results unavailable) 09/21/2017 office visit for follow-up of iron deficiency anemia: At [facility], noted patient was following up on anemia with FOBT positive stools, she had been seen inpatient 08/24/2017 for the same and had an EGD/colonoscopy 01/03/2017-EGD with normal with negative duodenal biopsies and colonoscopy with fair prep and 2 tubular adenomas and medium internal hemorrhoids, 08/24/2017 she had an EGD with push enteroscopy with small hiatal hernia, mild gastritis, 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 at about 105 cm of the pediatric colonoscope, APC applied; plan: Continue oral iron supplementation and consider IV iron at some point, continue Omeprazole 20 daily and avoid NSAIDs, recommended capsule endoscopy to exclude more distal small bowel AVMs, recommended surveillance colonoscopy 12/2021  Past Medical History:  Diagnosis Date   Acute pericardial effusion 05/17/2022   AKI (acute kidney injury) ([place]) 05/17/2022   Anxiety    Arthritis    Cervicalgia    Chondromalacia of patella    Chronic neck pain    Chronic pain syndrome    Depression    secondary to loss of her son at age 43   Diabetes mellitus    Diabetic neuropathy ([place])    DMII (diabetes mellitus, type 2) ([place])    Dysrhythmia    Enthesopathy of hip region    Fibromyalgia    GERD (gastroesophageal reflux disease)  Hep C w/o coma, chronic (HCC)    Hepatitis C    diagnosed 2005   Hypertension    Insomnia    Insomnia    Low back pain    Lumbago    Mitral regurgitation    Obesity    Pain in joint, upper arm    Primary localized osteoarthrosis, lower leg    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   Substance abuse ([place])    sober since 2001   Tobacco abuse    Tricuspid regurgitation    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    Past  Surgical History:  Procedure Laterality Date   ABDOMINAL HYSTERECTOMY     at age 31, unknown reasons   CARPAL TUNNEL RELEASE  12/07/2011   left side   EYE SURGERY     FOOT SURGERY Bilateral    MITRAL VALVE REPAIR N/A 04/19/2022   Procedure: MITRAL VALVE REPAIR WITH 30MM PHYSIO II ANNULOPLASTY RING;  Surgeon: Melrose Nakayama, MD;  Location: [place];  Service: Open Heart Surgery;  Laterality: N/A;   PERICARDIOCENTESIS N/A 05/10/2022   Procedure: PERICARDIOCENTESIS;  Surgeon: Jettie Booze, MD;  Location: [place] [facility];  Service: Cardiovascular;  Laterality: N/A;   RIGHT/LEFT HEART CATH AND CORONARY ANGIOGRAPHY N/A 02/12/2022   Procedure: RIGHT/LEFT HEART CATH AND CORONARY ANGIOGRAPHY;  Surgeon: Belva Crome, MD;  Location: [place] [facility];  Service: Cardiovascular;  Laterality: N/A;   TEE WITHOUT CARDIOVERSION N/A 02/03/2022   Procedure: TRANSESOPHAGEAL ECHOCARDIOGRAM (TEE);  Surgeon: Josue Hector, MD;  Location: [facility] ENDOSCOPY;  Service: Cardiovascular;  Laterality: N/A;   TEE WITHOUT CARDIOVERSION N/A 04/19/2022   Procedure: TRANSESOPHAGEAL ECHOCARDIOGRAM (TEE);  Surgeon: Melrose Nakayama, MD;  Location: [place];  Service: Open Heart Surgery;  Laterality: N/A;   TRICUSPID VALVE REPLACEMENT N/A 04/19/2022   Procedure: TRICUSPID VALVE REPAIR WITH 32MM MC3 ANNULOPLASTY RING;  Surgeon: Melrose Nakayama, MD;  Location: [facility];  Service: Open Heart Surgery;  Laterality: N/A;    Family History  Problem Relation Age of Onset   Uterine cancer Mother    Alcohol abuse Father    Alcohol abuse Sister    Drug abuse Sister    Drug abuse Brother    Alcohol abuse Brother    Schizophrenia Son    Diabetes Other    Arthritis Other    Hypertension Other    Heart attack Son 51       Died suddenly    Social History   Tobacco Use   Smoking status: Every Day    Packs/day: 0.25    Years: 35.00    Total pack years: 8.75    Types: Cigarettes   Smokeless tobacco: Never    Tobacco comments:    4 cigarettes a day  Vaping Use   Vaping Use: Some days  Substance Use Topics   Alcohol use: No   Drug use: No    Prior to Admission medications   Medication Sig Start Date End Date Taking? Authorizing Provider  acetaminophen (TYLENOL) 500 MG tablet Take 1,000 mg by mouth every 6 (six) hours as needed for mild pain.   Yes [provider]  albuterol (PROVENTIL HFA;VENTOLIN HFA) 108 (90 Base) MCG/ACT inhaler Inhale 2 puffs into the lungs every 6 (six) hours as needed for wheezing or shortness of breath.    Yes [provider]  ARIPiprazole (ABILIFY) 5 MG tablet Take 5 mg by mouth daily. 07/20/22  Yes [provider]  atorvastatin (LIPITOR) 20 MG tablet Take 20 mg by mouth daily.   Yes [provider]  buPROPion (WELLBUTRIN XL) 300 MG 24 hr tablet Take 300 mg by mouth in the morning. 03/24/22  Yes [provider]  diclofenac Sodium (VOLTAREN) 1 % GEL Apply 2-4 g topically 4 (four) times daily as needed (joint pain). 05/10/22  Yes [provider]  escitalopram (LEXAPRO) 10 MG tablet Take 10 mg by mouth every morning. 07/08/22  Yes [provider]  FLUoxetine (PROZAC) 20 MG capsule Take 20 mg by mouth every morning. 04/14/22  Yes [provider]  furosemide (LASIX) 20 MG tablet Take 1 tablet (20 mg total) by mouth daily. 05/27/22  Yes [name], MD  gabapentin (NEURONTIN) 600 MG tablet Take 0.5 tablets (300 mg total) by mouth 3 (three) times daily. 05/30/22  Yes [name], [name], MD  hydrOXYzine (ATARAX) 25 MG tablet Take 25 mg by mouth every 4 (four) hours as needed for anxiety.   Yes [provider]  Lifitegrast [facility] OP) Place 1 drop into both eyes daily.   Yes [provider]  linagliptin (TRADJENTA) 5 MG TABS tablet Take 5 mg by mouth in the morning.   Yes [provider]  losartan (COZAAR) 100 MG tablet Take 100 mg by mouth daily. 07/06/22  Yes [provider]   methocarbamol (ROBAXIN) 500 MG tablet Take 1 tablet (500 mg total) by mouth every 8 (eight) hours as needed for muscle spasms. 05/26/22  Yes [name], MD  metoprolol tartrate (LOPRESSOR) 50 MG tablet Take 1 tablet (50 mg total) by mouth 2 (two) times daily. 05/17/22  Yes [name], NP  omeprazole (PRILOSEC) 10 MG capsule Take 10 mg by mouth daily. 07/02/22  Yes [provider]  oxyCODONE (ROXICODONE) 15 MG immediate release tablet Take 0.5 tablets (7.5 mg total) by mouth every 8 (eight) hours as needed for pain. 05/30/22  Yes [name], [name], MD  PREMARIN 0.45 MG tablet Take 0.45 mg by mouth daily. 01/11/22  Yes [provider]  traZODone (DESYREL) 100 MG tablet Take 2.5 tablets (250 mg total) by mouth at bedtime as needed for sleep. Patient taking differently: Take 200 mg by mouth at bedtime as needed for sleep. 11/23/18  Yes [name], MD  warfarin (COUMADIN) 5 MG tablet TAKE 1 TABLET BY MOUTH DAILY OR AS DIRECTED BY ANTICOAGULATION CLINIC. Patient taking differently: Take 5 mg by mouth daily. Take 1 tablet by mouth daily and 1.5 on sundays 07/02/22  Yes Chandrasekhar, Terisa Starr, MD  ACCU-CHEK FASTCLIX LANCETS MISC 1 each by Does not apply route daily. Check blood sugar once daily. 250.00 10/26/11   [name], DO  blood glucose meter kit and supplies KIT Dispense based on patient and insurance preference. Use up to four times daily as directed. 05/26/22   [name], MD  glucose blood (ACCU-CHEK SMARTVIEW) test strip Check blood sugar once daily. 250.00 10/26/11 12/30/21  [name], DO  naloxone [facility]) nasal spray 4 mg/0.1 mL Place 1 spray into the nose as needed (accidental overdose).    [provider]    Current Facility-Administered Medications  Medication Dose Route Frequency Provider Last Rate Last Admin   acetaminophen (TYLENOL) tablet 650 mg  650 mg Oral Q6H PRN Karmen Bongo, MD       Or   acetaminophen (TYLENOL)  suppository 650 mg  650 mg Rectal Q6H PRN Karmen Bongo, MD  albuterol (PROVENTIL) (2.5 MG/3ML) 0.083% nebulizer solution 3 mL  3 mL Inhalation Q6H PRN Karmen Bongo, MD       ARIPiprazole (ABILIFY) tablet 5 mg  5 mg Oral Daily Karmen Bongo, MD   5 mg at 07/27/22 1010   atorvastatin (LIPITOR) tablet 20 mg  20 mg Oral Daily Karmen Bongo, MD   20 mg at 07/27/22 1010   buPROPion (WELLBUTRIN XL) 24 hr tablet 300 mg  300 mg Oral q AM Karmen Bongo, MD   300 mg at 07/27/22 0819   escitalopram (LEXAPRO) tablet 10 mg  10 mg Oral q morning Karmen Bongo, MD   10 mg at 07/27/22 1011   gabapentin (NEURONTIN) tablet 300 mg  300 mg Oral TID Karmen Bongo, MD   300 mg at 07/27/22 1009   hydrOXYzine (ATARAX) tablet 25 mg  25 mg Oral Q4H PRN Karmen Bongo, MD       insulin aspart (novoLOG) injection 0-15 Units  0-15 Units Subcutaneous TID [facility] Karmen Bongo, MD   2 Units at 07/26/22 1907   methocarbamol (ROBAXIN) tablet 500 mg  500 mg Oral Q8H PRN Karmen Bongo, MD   500 mg at 07/27/22 1015   nicotine (NICODERM CQ - dosed in mg/24 hours) patch 21 mg  21 mg Transdermal Daily Karmen Bongo, MD   21 mg at 07/27/22 1010   ondansetron (ZOFRAN) tablet 4 mg  4 mg Oral Q6H PRN Karmen Bongo, MD       Or   ondansetron [facility]) injection 4 mg  4 mg Intravenous Q6H PRN Karmen Bongo, MD       oxyCODONE (Oxy IR/ROXICODONE) immediate release tablet 15 mg  15 mg Oral Q8H PRN Hosie Poisson, MD   15 mg at 07/27/22 1009   pantoprazole (PROTONIX) EC tablet 40 mg  40 mg Oral Daily Karmen Bongo, MD   40 mg at 07/27/22 1011   sodium chloride flush (NS) 0.9 % injection 3 mL  3 mL Intravenous Lillia Mountain, MD   3 mL at 07/27/22 1012   traZODone (DESYREL) tablet 200 mg  200 mg Oral QHS PRN Karmen Bongo, MD   200 mg at 07/26/22 2056    Allergies as of 07/26/2022 - Review Complete 07/26/2022  Allergen Reaction Noted   Hydrocodone-acetaminophen Itching 12/11/2016   Hydrocodone Itching  01/16/2017     Review of Systems:    Constitutional: No weight loss, fever or chills Skin: No rash  Cardiovascular: No chest pain Respiratory: No SOB Gastrointestinal: See HPI and otherwise negative Genitourinary: No dysuria or change in urinary frequency Neurological: +dizziness Musculoskeletal: No new muscle or joint pain Hematologic: No bruising Psychiatric: No history of depression or anxiety    Physical Exam:  Vital signs in last 24 hours: Temp:  [97.5 F (36.4 C)-98.5 F (36.9 C)] 98.5 F (36.9 C) (08/22 1234) Pulse Rate:  [48-90] 87 (08/22 1100) Resp:  [8-23] 16 (08/22 1234) BP: (75-168)/(35-102) 168/102 (08/22 1234) SpO2:  [85 %-100 %] 96 % (08/22 1100) Last BM Date : 07/26/22 General:   Pleasant AA female appears to be in NAD, Well developed, Well nourished, alert and cooperative Head:  Normocephalic and atraumatic. Eyes:   PEERL, EOMI. No icterus. Conjunctiva pink. Ears:  Normal auditory acuity. Neck:  Supple Throat: Oral cavity and pharynx without inflammation, swelling or lesion. Lungs: Respirations even and unlabored. Lungs clear to auscultation bilaterally.   No wheezes, crackles, or rhonchi.  Heart: Normal S1, S2. No MRG. Regular rate and rhythm. No peripheral edema,  cyanosis or pallor.  Abdomen:  Soft, nondistended, nontender. No rebound or guarding. Normal bowel sounds. No appreciable masses or hepatomegaly. Rectal:  Not performed.  Msk:  Symmetrical without gross deformities. Peripheral pulses intact.  Extremities:  Without edema, no deformity or joint abnormality.  Neurologic:  Alert and  oriented x4;  grossly normal neurologically.  Skin:   Dry and intact without significant lesions or rashes. Psychiatric: Demonstrates good judgement and reason without abnormal affect or behaviors.  LAB RESULTS: Recent Labs    07/26/22 1115 07/26/22 1149 07/26/22 1901 07/27/22 0110  WBC 5.3  --  5.1 6.2  HGB 7.5* 8.5* 9.4* 9.4*  HCT 24.0* 25.0* 30.5* 29.4*  PLT  201  --  219 204   BMET Recent Labs    07/26/22 1115 07/26/22 1149 07/27/22 0110  NA 134* 135 137  K 4.4 4.3 4.2  CL 105 106 109  CO2 20*  --  22  GLUCOSE 131* 129* 107*  BUN 23 23 19   CREATININE 2.30* 2.40* 1.62*  CALCIUM 7.6*  --  7.6*   LFT Recent Labs    07/26/22 1115  PROT 5.5*  ALBUMIN 2.9*  AST 27  ALT 17  ALKPHOS 60  BILITOT 0.4   PT/INR Recent Labs    07/26/22 1115 07/27/22 0110  LABPROT 29.4* 27.1*  INR 2.8* 2.5*    STUDIES: ECHOCARDIOGRAM LIMITED  Result Date: 07/27/2022    ECHOCARDIOGRAM LIMITED REPORT   Patient Name:   JULY LINAM Date of Exam: 07/27/2022 Medical Rec #:  [id number]         Height:       71.0 in Accession #:    [id number]        Weight:       210.2 lb Date of Birth:  1961-10-15         BSA:          2.154 m Patient Age:    10 years          BP:           155/82 mmHg Patient Gender: F                 HR:           87 bpm. Exam Location:  Inpatient Procedure: Limited Echo, Limited Color Doppler and Cardiac Doppler Indications:    mitral valve disorder  History:        Patient has prior history of Echocardiogram examinations, most                 recent 05/31/2022. CHF, Arrythmias:Bradycardia; Risk                 Factors:Dyslipidemia, Diabetes and Current Smoker.                  Mitral Valve: valve is present in the mitral position. Procedure                 Date: 04/19/22.  Sonographer:    Johny Chess RDCS Referring Phys: [id number] [place] A CHANDRASEKHAR IMPRESSIONS  1. Left ventricular ejection fraction, by estimation, is 50 to 55%. The left ventricle has low normal function.  2. Left atrial size was severely dilated.  3. Right atrial size was moderately dilated.  4. S/p MV prosthetic annuloplasty ring placed 04/19/2022. mod-severe mitral stenosis, MV mean gradient 15 mmHg, peak 27 mmhg at HR 87 bpm. Mild mitral valve regurgitation. There is a present in the mitral position. Procedure  Date: 04/19/22.  5. The tricuspid valve is status post  repair with an annuloplasty ring. Tricuspid valve regurgitation is moderate.  6. Aortic valve regurgitation is not visualized.  7. There is severely elevated pulmonary artery systolic pressure. Conclusion(s)/Recommendation(s): Compared to prior study, MV mean gradient has increased. May be related to rate. Consider TEE/cardiac CT if clinically indicated. FINDINGS  Left Ventricle: Left ventricular ejection fraction, by estimation, is 50 to 55%. The left ventricle has low normal function. Right Ventricle: There is severely elevated pulmonary artery systolic pressure. The tricuspid regurgitant velocity is 3.74 m/s, and with an assumed right atrial pressure of 5 mmHg, the estimated right ventricular systolic pressure is 91.7 mmHg. Left Atrium: Left atrial size was severely dilated. Right Atrium: Right atrial size was moderately dilated. Mitral Valve: S/p MV prosthetic annuloplasty ring placed 04/19/2022. mod-severe mitral stenosis, MV mean gradient 15 mmHg, peak 27 mmhg at HR 87 bpm. Mild mitral valve regurgitation. There is a present in the mitral position. Procedure Date: 04/19/22. MV peak gradient, 26.8 mmHg. The mean mitral valve gradient is 15.0 mmHg. Tricuspid Valve: Tricuspid valve regurgitation is moderate. 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 Aortic Valve: Aortic valve regurgitation is not visualized. Pulmonic Valve: Pulmonic valve regurgitation is not visualized. LEFT VENTRICLE PLAX 2D LVOT diam:     2.30 cm LV SV:         88 LV SV Index:   41 LVOT Area:     4.15 cm  IVC IVC diam: 2.20 cm AORTIC VALVE             PULMONIC VALVE LVOT Vmax:   114.50 cm/s RVOT Peak grad: 1 mmHg LVOT Vmean:  75.250 cm/s LVOT VTI:    0.213 m MITRAL VALVE              TRICUSPID VALVE MV Area VTI:  1.53 cm    TV Peak grad:   6.5 mmHg MV Peak grad: 26.8 mmHg   TV Mean grad:   4.0 mmHg MV Mean grad: 15.0 mmHg   TV Vmax:        1.27 m/s MV Vmax:      2.59 m/s    TV Vmean:       93.6 cm/s MV Vmean:      183.0 cm/s  TV VTI:         0.29 msec MR Peak grad: 149.3 mmHg  TR Peak grad:   56.0 mmHg MR Mean grad: 97.0 mmHg   TR Vmax:        374.00 cm/s MR Vmax:      611.00 cm/s MR Vmean:     469.0 cm/s  SHUNTS                           Systemic VTI:  0.21 m                           Systemic Diam: 2.30 cm                           Pulmonic VTI:  0.107 m Phineas Inches Electronically signed by Phineas Inches Signature Date/Time: 07/27/2022/12:28:20 PM    Final    DG Chest Port 1 View  Result Date: 07/26/2022 CLINICAL DATA:  Syncope EXAM: PORTABLE CHEST 1 VIEW COMPARISON:  07/22/2022 FINDINGS: Transverse diameter of heart is increased. Central pulmonary  vessels are more prominent. Increased interstitial markings are seen in the parahilar regions and lower lung fields. There is no focal consolidation. There is no pleural effusion or pneumothorax. 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 IMPRESSION: Central pulmonary vessels are more prominent. Increased interstitial markings are seen in the parahilar regions and lower lung fields. Findings suggest CHF with interstitial pulmonary edema. Electronically Signed   By: Elmer Picker M.D.   On: 07/26/2022 12:49   CT Head Wo Contrast  Result Date: 07/26/2022 CLINICAL DATA:  Head trauma, coagulopathy, syncopal episode striking head, history MVR, d type II iabetes mellitus, hypertension, smoker EXAM: CT HEAD WITHOUT CONTRAST TECHNIQUE: Contiguous axial images were obtained from the base of the skull through the vertex without intravenous contrast. RADIATION DOSE REDUCTION: This exam was performed according to the departmental dose-optimization program which includes automated exposure control, adjustment of the mA and/or kV according to patient size and/or use of iterative reconstruction technique. COMPARISON:  05/22/2022 FINDINGS: Brain: Normal ventricular morphology. No midline shift or mass effect. Normal appearance of brain parenchyma. No  intracranial hemorrhage, mass lesion, evidence of acute infarction, or extra-axial fluid collection. Few scattered nonspecific dural calcifications unchanged. Vascular: No hyperdense vessels. Skull: Intact Sinuses/Orbits: Clear Other: N/A IMPRESSION: No acute intracranial abnormalities. Electronically Signed   By: Lavonia Dana M.D.   On: 07/26/2022 12:27     Impression / Plan:   Impression: 1.  Acute on chronic anemia with hematochezia: Baseline hemoglobin around 10, 7.5 at admission-> 1 unit PRBCs--> 8.5-->9.4, described 1 episode of bright red blood on a stool, previous history of anemia with EGD, colonoscopy, enteroscopy and small bowel pill capsule endoscopy in 2018, AVMs in the small bowel (unable to see pill capsule results), no further bowel movements since; consider known AVMs +/- hemorrhoids given history of constipation 2.  A-fib: Typically on Coumadin, this is on hold given recent bleeding history 3.  Chronic systolic heart failure: Echo 05/31/2022 with an EF of 50-55% 4.  Chronic pain syndrome 5.  AKI on stage IIIa CKD 6.  Dizziness: Currently being worked up  Plan: 1.  Discussed with patient that we will just observe her overnight to see how she does, if she develops further hematochezia then we will need to consider possible colonoscopy +/- enteroscopy while she is hospitalized.  If she does not have any further bleeding and her hemoglobin remains stable then likely this can be completed outpatient.  She verbalized understanding.  Some concern that if she were to bowel prep this would increase her symptoms of dizziness now. 2.  Patient can remain on regular diet today and we will see her in the morning. 3.  Continue to monitor hemoglobin with transfusion as needed less than 7  Thank you for your kind consultation, we will continue to follow.  Lavone Nian [facility]  07/27/2022, 12:51 PM    Attending Physician Note   I have taken a history, reviewed the chart and examined the  patient. I performed a substantive portion of this encounter, including complete performance of at least one of the key components, in conjunction with the APP. I agree with the APP's note, impression and recommendations with my edits. My additional impressions and recommendations are as follows.   *Hematochezia on Coumadin. R/O AVMs, diverticulosis, neoplasm, other. Prior EGD, colonoscopy, enteroscopy and small bowel pill capsule endoscopy in 2018. 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 Colonoscopy +/- push enteroscopy when INR < 1.8 which could be performed as inpatient or outpatient however prefer  inpatient endoscopic evaluation.    *ABL on chronic anemia. Trend CBC. Transfusion for Hgb < 7.  *Syncope vs presyncope and hypotension, bradycardia in ED.  *Afib, S/P MVR, TVR in May 2023. Coumadin on hold. Trend PT/INR.  *AKI on CKD3  Lucio Edward, MD [facility] See Shea Evans, [facility], for our on call provider

## 2022-07-27 NOTE — ED Notes (Signed)
Pt changed and linens changed, reconnected back to cardiac monitor.

## 2022-07-27 NOTE — Progress Notes (Signed)
Progress Note  Patient Name: [name] Date of Encounter: 07/27/2022  Primary Cardiologist: Werner Lean, MD   Subjective   Overnight no events- converted to SR. Patient notes mild dizziness but is otherwise well.  Inpatient Medications    Scheduled Meds:  ARIPiprazole  5 mg Oral Daily   atorvastatin  20 mg Oral Daily   buPROPion  300 mg Oral q AM   escitalopram  10 mg Oral q morning   gabapentin  300 mg Oral TID   insulin aspart  0-15 Units Subcutaneous TID WC   Lifitegrast  1 drop Both Eyes Daily   nicotine  21 mg Transdermal Daily   pantoprazole  40 mg Oral Daily   sodium chloride flush  3 mL Intravenous Q12H   Continuous Infusions:  PRN Meds: acetaminophen **OR** acetaminophen, albuterol, hydrOXYzine, methocarbamol, ondansetron **OR** ondansetron (ZOFRAN) IV, oxyCODONE, traZODone   Vital Signs    Vitals:   07/27/22 0715 07/27/22 0730 07/27/22 0800 07/27/22 1100  BP:  135/87 (!) 162/93 (!) 155/82  Pulse:  84 90 87  Resp:  '12 17 14  '$ Temp: 98 F (36.7 C)   98.3 F (36.8 C)  TempSrc: Oral     SpO2:  96% 96% 96%    Intake/Output Summary (Last 24 hours) at 07/27/2022 1107 Last data filed at 07/27/2022 0754 Gross per 24 hour  Intake 1587.58 ml  Output 1000 ml  Net 587.58 ml   There were no vitals filed for this visit.  Telemetry    SR - Personally Reviewed  Physical Exam   General:  Well nourished, well developed, in no acute distress HEENT: normal Neck: no JVD Cardiac:  normal S1, S2; RRR; systolic and diastolic murmurs Lungs:  clear to auscultation bilaterally, no wheezing, rhonchi or rales  Abd: soft, nontender, no hepatomegaly  Ext: no edema Musculoskeletal:  No deformities, BUE and BLE strength normal and equal Skin: warm and dry  Neuro:  CNs 2-12 intact, no focal abnormalities noted Psych:  Normal affect    Labs    Chemistry Recent Labs  Lab 07/22/22 1852 07/22/22 1906 07/23/22 0146 07/24/22 0322 07/26/22 1115  07/26/22 1149 07/27/22 0110  NA 135   < > 134* 136 134* 135 137  K 6.7*   < > 4.8 4.6 4.4 4.3 4.2  CL 105   < > 105 109 105 106 109  CO2 19*  --  21* 22 20*  --  22  GLUCOSE 191*   < > 79 103* 131* 129* 107*  BUN 28*   < > 29* '21 23 23 19  '$ CREATININE 2.33*   < > 2.08* 1.24* 2.30* 2.40* 1.62*  CALCIUM 7.9*  --  8.3* 8.3* 7.6*  --  7.6*  PROT 6.1*  --  6.6  --  5.5*  --   --   ALBUMIN 3.2*  --  3.5  --  2.9*  --   --   AST 29  --  40  --  27  --   --   ALT 16  --  19  --  17  --   --   ALKPHOS 54  --  60  --  60  --   --   BILITOT 0.7  --  0.6  --  0.4  --   --   GFRNONAA 23*  --  26* 49* 23*  --  36*  ANIONGAP 11  --  '8 5 9  '$ --  6   < > =  values in this interval not displayed.     Hematology Recent Labs  Lab 07/26/22 1115 07/26/22 1149 07/26/22 1901 07/27/22 0110  WBC 5.3  --  5.1 6.2  RBC 2.65*  --  3.31* 3.27*  HGB 7.5* 8.5* 9.4* 9.4*  HCT 24.0* 25.0* 30.5* 29.4*  MCV 90.6  --  92.1 89.9  MCH 28.3  --  28.4 28.7  MCHC 31.3  --  30.8 32.0  RDW 16.7*  --  16.6* 16.5*  PLT 201  --  219 204    Cardiac EnzymesNo results for input(s): "TROPONINI" in the last 168 hours. No results for input(s): "TROPIPOC" in the last 168 hours.   BNP Recent Labs  Lab 07/26/22 1115  BNP 699.1*     DDimer No results for input(s): "DDIMER" in the last 168 hours.   Radiology    DG Chest Port 1 View  Result Date: 07/26/2022 CLINICAL DATA:  Syncope EXAM: PORTABLE CHEST 1 VIEW COMPARISON:  07/22/2022 FINDINGS: Transverse diameter of heart is increased. Central pulmonary vessels are more prominent. Increased interstitial markings are seen in the parahilar regions and lower lung fields. There is no focal consolidation. There is no pleural effusion or pneumothorax. 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 IMPRESSION: Central pulmonary vessels are more prominent. Increased interstitial markings are seen in the parahilar regions and lower lung fields. Findings suggest  CHF with interstitial pulmonary edema. Electronically Signed   By: Elmer Picker M.D.   On: 07/26/2022 12:49   CT Head Wo Contrast  Result Date: 07/26/2022 CLINICAL DATA:  Head trauma, coagulopathy, syncopal episode striking head, history MVR, d type II iabetes mellitus, hypertension, smoker EXAM: CT HEAD WITHOUT CONTRAST TECHNIQUE: Contiguous axial images were obtained from the base of the skull through the vertex without intravenous contrast. RADIATION DOSE REDUCTION: This exam was performed according to the departmental dose-optimization program which includes automated exposure control, adjustment of the mA and/or kV according to patient size and/or use of iterative reconstruction technique. COMPARISON:  05/22/2022 FINDINGS: Brain: Normal ventricular morphology. No midline shift or mass effect. Normal appearance of brain parenchyma. No intracranial hemorrhage, mass lesion, evidence of acute infarction, or extra-axial fluid collection. Few scattered nonspecific dural calcifications unchanged. Vascular: No hyperdense vessels. Skull: Intact Sinuses/Orbits: Clear Other: N/A IMPRESSION: No acute intracranial abnormalities. Electronically Signed   By: Lavonia Dana M.D.   On: 07/26/2022 12:27     Patient Profile     61 y.o. female presenting with complete heart block complicated by hypotension and acute kidney injury, background history of recent mitral and tricuspid valve repair, paroxysmal atrial fibrillation and atrial flutter on warfarin anticoagulation  Assessment & Plan     Syncope Hypotension Bradycardia with paroxsymal atrial fibrillation CHADSVASC NA Inflammatory/rheumatic heart disease S/p MVR and TVR both annuloplasty rings - no further bradycardia; I do not suspect this was bradycardia medicated; based on imaging we will plan to restart low dose BB and have live heart monitor at discharge - if no issues on Primary side or plans for GI work up we will restart warfarin - TEE for  tomorrow; orders placed consented yesterday; limited TTE pending for today     For questions or updates, please contact [facility] Please consult [email or web address] for contact info under Cardiology/STEMI.      Rudean Haskell, MD [address], [address] [place], [place] [address] [phone]  11:07 AM

## 2022-07-27 NOTE — ED Notes (Signed)
Breakfast order placed ?

## 2022-07-27 NOTE — TOC Progression Note (Addendum)
Transition of Care [facility]) - Progression Note    Patient Details  Name: [name] MRN: [id number] Date of Birth: Mar 28, 1961  [facility] of [facility] CM/SW Contact  Zenon Mayo, RN Phone Number: 07/27/2022, 4:13 PM  Clinical Narrative:    From home alone, indep, she states she has all the DME she needs, walker, cane, w/chair.  She still drives. She is disabled. She has no HH services at this time.  She uses the [facility] on [address].  Dr. Basilio Cairo is her PCP at [facility] on [address]. She states she has a apt coming up on 9/17 with her PCP.        Expected Discharge Plan and Services                                                 Social Determinants of Health (SDOH) Interventions    Readmission Risk Interventions     No data to display

## 2022-07-27 NOTE — Care Management Obs Status (Signed)
[facility] NOTIFICATION   Patient Details  Name: [name] MRN: [id number] Date of Birth: [birth year]   Medicare Observation Status Notification Given:  Yes    Zenon Mayo, RN 07/27/2022, 4:09 PM

## 2022-07-27 NOTE — ED Notes (Signed)
Pt disconnected from monitor and sitting in chair bathing. Pt requesting her pain medication be changed. RN paged admitting MD.

## 2022-07-27 NOTE — Progress Notes (Signed)
PROGRESS NOTE    [name]  CNO:[id number] DOB: 1961-04-15 DOA: 07/26/2022 PCP: Pcp, No    Chief Complaint  Patient presents with   Hypotension   Loss of Consciousness   Weakness    Brief Narrative:  [name] is a 61 yo female with PMH atrial fibrillation/flutter on Coumadin, valvular heart disease status post mitral and tricuspid valve repair (annuloplasty ring) on 04/19/2022 with pericardial effusion s/p pericardiocentesis 05/10/22, DMII, GERD, HTN, chronic pain syndrome discharged from the hospital on 07/24/2022, readmitted for persistent dizziness and syncope.  Patient also reports that she had bright red blood per rectum with bowel movement once since discharge.  On arrival to ED she was hypotensive, bradycardic which have resolved with IV fluids.  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 Patient was on metoprolol which was discontinued this admission.  Diltiazem was discontinued prior to discharge. Echocardiogram ordered and cardiology consulted and she was scheduled for TEE for further evaluation.    Acute kidney injury on stage IIIa CKD Secondary to prerenal azotemia from hypotension holding Cozaar for now.  Her blood pressure improved with fluid boluses and creatinine has improved to 1.6.     Acute anemia of blood loss Possibly lower GI bleed. Her baseline hemoglobin around 10 and she was  admitted with a hemoglobin of 7.5, s/p 1 unit of PRBC transfusion.  And repeat hemoglobin is 9.4. GI consulted and plan to hold the Coumadin for now and overnight observation..  Her INR is 2.5    Atrial fibrillation Rate controlled INR is 2.5 Coumadin on hold for concern for rectal bleeding. Rate controlling drugs have been held due to bradycardia and dizziness and syncope.    Chronic pain syndrome Patient is on oxycodone 7.5 mg every 8 hours but she reports that he has been taking 15 mg every 8 hours in addition to methocarbamol.  She is at risk for opioid misuse and overdose and suspect her symptoms could partly be due to taking oxycodone 15 mg every 8 hours. When we tried to order the 7.5 mg of oxycodone patient became upset and swears that she was told to take 15 mg every 8 hours by her provider.    Type 2 diabetes mellitus Well-controlled CBGs and last A1c was 5.4 Continue with sliding scale insulin and gabapentin    COPD Patient reports that she is smoking and continues to smoke.  No wheezing heard on exam Nicotine patch order. No shortness of breath at this time.   Mental health disorder:  Adjusted medications due to risk of serotonin syndrome.    Hyperlipidemia:  Continue with lipitor.   DVT prophylaxis: SCD;S Code Status: Full code Family Communication: None at bedside  disposition:   Status is: Observation The patient will require care spanning > 2 midnights and should be moved to inpatient because: TEE in am.    Level of care: Progressive Consultants:  Cardiology and gastroenterology.  Procedures: TEE scheduled tomorrow Antimicrobials:  None   Subjective: Reports worsening back pain, requesting for her home dose of oxycodone.   Objective: Vitals:   07/27/22 0800 07/27/22 1100 07/27/22 1234 07/27/22 1604  BP: (!) 162/93 (!) 155/82 (!) 168/102 (!) 161/87  Pulse: 90 87  93  Resp: '17 14 16 18  '$ Temp:  98.3 F (36.8 C) 98.5 F (36.9 C) 98 F (36.7  C)  TempSrc:   Oral Oral  SpO2: 96% 96%  95%    Intake/Output Summary (Last 24 hours) at 07/27/2022 1751 Last data filed at 07/27/2022 1443 Gross per 24 hour  Intake 240 ml  Output 1000 ml  Net -760 ml   There were no vitals filed for this visit.  Examination:  General exam: Appears calm and comfortable  Respiratory system: Clear to auscultation. Respiratory effort normal. Cardiovascular system: S1 & S2 heard, RRR. No JVD,  No pedal edema. Gastrointestinal system: Abdomen is nondistended, soft and nontender. No organomegaly or masses felt. Normal bowel sounds heard. Central nervous system: Alert and oriented. No focal neurological deficits. Extremities: Symmetric 5 x 5 power. Skin: No rashes, lesions or ulcers Psychiatry: Judgement and insight appear normal. Mood & affect appropriate.     Data Reviewed: I have personally reviewed following labs and imaging studies  CBC: Recent Labs  Lab 07/22/22 1852 07/22/22 1906 07/23/22 0146 07/24/22 0322 07/26/22 1115 07/26/22 1149 07/26/22 1901 07/27/22 0110  WBC 10.2  --  8.9 5.1 5.3  --  5.1 6.2  NEUTROABS 6.4  --   --  3.3 3.4  --   --   --   HGB 9.0*   < > 9.7* 8.1* 7.5* 8.5* 9.4* 9.4*  HCT 29.3*   < > 30.2* 25.0* 24.0* 25.0* 30.5* 29.4*  MCV 90.2  --  87.8 85.9 90.6  --  92.1 89.9  PLT 190  --  178 163 201  --  219 204   < > = values in this interval not displayed.    Basic Metabolic Panel: Recent Labs  Lab 07/22/22 1852 07/22/22 1906 07/23/22 0146 07/24/22 0322 07/26/22 1115 07/26/22 1149 07/27/22 0110  NA 135   < > 134* 136 134* 135 137  K 6.7*   < > 4.8 4.6 4.4 4.3 4.2  CL 105   < > 105 109 105 106 109  CO2 19*  --  21* 22 20*  --  22  GLUCOSE 191*   < > 79 103* 131* 129* 107*  BUN 28*   < > 29* '21 23 23 19  '$ CREATININE 2.33*   < > 2.08* 1.24* 2.30* 2.40* 1.62*  CALCIUM 7.9*  --  8.3* 8.3* 7.6*  --  7.6*  MG  --   --  1.5* 1.6*  --   --   --    < > = values in this interval not displayed.     GFR: Estimated Creatinine Clearance: 45.8 mL/min (A) (by C-G formula based on SCr of 1.62 mg/dL (H)).  Liver Function Tests: Recent Labs  Lab 07/22/22 1852 07/23/22 0146 07/26/22 1115  AST 29 40 27  ALT '16 19 17  '$ ALKPHOS 54 60 60  BILITOT 0.7 0.6 0.4  PROT 6.1* 6.6 5.5*  ALBUMIN 3.2* 3.5 2.9*    CBG: Recent Labs  Lab 07/26/22 1900 07/26/22 2159 07/27/22 0750 07/27/22 1258 07/27/22 1627  GLUCAP 129* 91 95 85 112*     No results found for this or any previous visit (from the past 240 hour(s)).  Radiology Studies: ECHOCARDIOGRAM LIMITED  Result Date: 07/27/2022    ECHOCARDIOGRAM LIMITED REPORT   Patient Name:   [name] Date of Exam: 07/27/2022 Medical Rec #:  [id number]         Height:       71.0 in Accession #:    [id number]        Weight:       210.2 lb Date of Birth:  January 03, 1961         BSA:          2.154 m Patient Age:    72 years          BP:           155/82 mmHg Patient Gender: F                 HR:           87 bpm. Exam Location:  Inpatient Procedure: Limited Echo, Limited Color Doppler and Cardiac Doppler Indications:    mitral valve disorder  History:        Patient has prior history of Echocardiogram examinations, most                 recent 05/31/2022. CHF, Arrythmias:Bradycardia; Risk                 Factors:Dyslipidemia, Diabetes and Current Smoker.                  Mitral Valve: valve is present in the mitral position. Procedure                 Date: 04/19/22.  Sonographer:    Johny Chess RDCS Referring Phys: [id number] [place] A CHANDRASEKHAR IMPRESSIONS  1. Left ventricular ejection fraction, by estimation, is 50 to 55%. The left ventricle has low normal function.  2. Left atrial size was severely dilated.  3. Right atrial size was moderately dilated.  4. S/p MV prosthetic annuloplasty ring placed 04/19/2022. mod-severe mitral stenosis, MV mean gradient 15 mmHg, peak 27 mmhg at HR 87 bpm. Mild mitral valve regurgitation. There is a present in  the mitral position. Procedure Date: 04/19/22.  5. 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 Tricuspid valve regurgitation is moderate.  6. Aortic valve regurgitation is not visualized.  7. There is severely elevated pulmonary artery systolic pressure. Conclusion(s)/Recommendation(s): Compared to prior study, MV mean gradient has increased. May be related to rate. Consider TEE/cardiac CT if clinically indicated. FINDINGS  Left Ventricle: Left ventricular ejection fraction, by estimation, is 50 to 55%. The left ventricle has low normal function. Right Ventricle: There is severely elevated pulmonary artery systolic pressure. The tricuspid regurgitant velocity is 3.74 m/s, and with an assumed right atrial pressure of 5 mmHg, the estimated right ventricular systolic pressure is 96.2 mmHg. Left Atrium: Left atrial size was severely dilated. Right Atrium: Right atrial size was moderately dilated. Mitral Valve: S/p MV prosthetic annuloplasty ring placed 04/19/2022. mod-severe mitral stenosis, MV mean gradient 15 mmHg, peak 27 mmhg at HR 87 bpm. Mild mitral valve regurgitation. There is a present in the mitral position. Procedure Date: 04/19/22. MV peak gradient, 26.8 mmHg. The mean mitral valve gradient is 15.0 mmHg. Tricuspid Valve: Tricuspid valve regurgitation is moderate. 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 Aortic Valve: Aortic valve regurgitation is not visualized. Pulmonic Valve: Pulmonic valve regurgitation is not visualized. LEFT VENTRICLE PLAX 2D LVOT diam:     2.30 cm LV SV:  88 LV SV Index:   41 LVOT Area:     4.15 cm  IVC IVC diam: 2.20 cm AORTIC VALVE             PULMONIC VALVE LVOT Vmax:   114.50 cm/s RVOT Peak grad: 1 mmHg LVOT Vmean:  75.250 cm/s LVOT VTI:    0.213 m MITRAL VALVE              TRICUSPID VALVE MV Area VTI:  1.53 cm    TV Peak grad:   6.5 mmHg MV Peak grad: 26.8 mmHg   TV Mean grad:   4.0 mmHg MV Mean grad: 15.0 mmHg   TV Vmax:         1.27 m/s MV Vmax:      2.59 m/s    TV Vmean:       93.6 cm/s MV Vmean:     183.0 cm/s  TV VTI:         0.29 msec MR Peak grad: 149.3 mmHg  TR Peak grad:   56.0 mmHg MR Mean grad: 97.0 mmHg   TR Vmax:        374.00 cm/s MR Vmax:      611.00 cm/s MR Vmean:     469.0 cm/s  SHUNTS                           Systemic VTI:  0.21 m                           Systemic Diam: 2.30 cm                           Pulmonic VTI:  0.107 m Phineas Inches Electronically signed by Phineas Inches Signature Date/Time: 07/27/2022/12:28:20 PM    Final    DG Chest Port 1 View  Result Date: 07/26/2022 CLINICAL DATA:  Syncope EXAM: PORTABLE CHEST 1 VIEW COMPARISON:  07/22/2022 FINDINGS: Transverse diameter of heart is increased. Central pulmonary vessels are more prominent. Increased interstitial markings are seen in the parahilar regions and lower lung fields. There is no focal consolidation. There is no pleural effusion or pneumothorax. 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 IMPRESSION: Central pulmonary vessels are more prominent. Increased interstitial markings are seen in the parahilar regions and lower lung fields. Findings suggest CHF with interstitial pulmonary edema. Electronically Signed   By: Elmer Picker M.D.   On: 07/26/2022 12:49   CT Head Wo Contrast  Result Date: 07/26/2022 CLINICAL DATA:  Head trauma, coagulopathy, syncopal episode striking head, history MVR, d type II iabetes mellitus, hypertension, smoker EXAM: CT HEAD WITHOUT CONTRAST TECHNIQUE: Contiguous axial images were obtained from the base of the skull through the vertex without intravenous contrast. RADIATION DOSE REDUCTION: This exam was performed according to the departmental dose-optimization program which includes automated exposure control, adjustment of the mA and/or kV according to patient size and/or use of iterative reconstruction technique. COMPARISON:  05/22/2022 FINDINGS: Brain: Normal ventricular morphology. No  midline shift or mass effect. Normal appearance of brain parenchyma. No intracranial hemorrhage, mass lesion, evidence of acute infarction, or extra-axial fluid collection. Few scattered nonspecific dural calcifications unchanged. Vascular: No hyperdense vessels. Skull: Intact Sinuses/Orbits: Clear Other: N/A IMPRESSION: No acute intracranial abnormalities. Electronically Signed   By: Lavonia Dana M.D.   On: 07/26/2022 12:27  Scheduled Meds:  ARIPiprazole  5 mg Oral Daily   atorvastatin  20 mg Oral Daily   buPROPion  300 mg Oral q AM   escitalopram  10 mg Oral q morning   gabapentin  300 mg Oral TID   insulin aspart  0-15 Units Subcutaneous TID WC   nicotine  21 mg Transdermal Daily   pantoprazole  40 mg Oral Daily   sodium chloride flush  3 mL Intravenous Q12H   Continuous Infusions:   LOS: 0 days    Time spent: 45 minutes.     Hosie Poisson, MD [facility]   To contact the attending provider between 7A-7P or the covering provider during after hours 7P-7A, please log into the web site [email or web address] and access using universal [place] password for that web site. If you do not have the password, please call the hospital operator.  07/27/2022, 5:51 PM

## 2022-07-28 ENCOUNTER — Observation Stay (HOSPITAL_COMMUNITY): Payer: Medicare Other | Admitting: Anesthesiology

## 2022-07-28 ENCOUNTER — Observation Stay (HOSPITAL_BASED_OUTPATIENT_CLINIC_OR_DEPARTMENT_OTHER)
Admit: 2022-07-28 | Discharge: 2022-07-28 | Disposition: A | Discharge status: Home / Self Care | Payer: Medicare Other | Attending: Internal Medicine | Admitting: Internal Medicine

## 2022-07-28 ENCOUNTER — Encounter (HOSPITAL_COMMUNITY): Payer: Self-pay | Admitting: Internal Medicine

## 2022-07-28 ENCOUNTER — Encounter (HOSPITAL_COMMUNITY)
Admission: EM | Disposition: A | Discharge status: Home / Self Care | Payer: Self-pay | Source: Home / Self Care | Attending: Internal Medicine

## 2022-07-28 DIAGNOSIS — R001 Bradycardia, unspecified: Secondary | ICD-10-CM | POA: Diagnosis not present

## 2022-07-28 DIAGNOSIS — G894 Chronic pain syndrome: Secondary | ICD-10-CM | POA: Diagnosis not present

## 2022-07-28 DIAGNOSIS — I5022 Chronic systolic (congestive) heart failure: Secondary | ICD-10-CM | POA: Diagnosis not present

## 2022-07-28 DIAGNOSIS — I361 Nonrheumatic tricuspid (valve) insufficiency: Secondary | ICD-10-CM

## 2022-07-28 DIAGNOSIS — I340 Nonrheumatic mitral (valve) insufficiency: Secondary | ICD-10-CM | POA: Diagnosis not present

## 2022-07-28 DIAGNOSIS — F17210 Nicotine dependence, cigarettes, uncomplicated: Secondary | ICD-10-CM

## 2022-07-28 DIAGNOSIS — Z7901 Long term (current) use of anticoagulants: Secondary | ICD-10-CM | POA: Diagnosis not present

## 2022-07-28 DIAGNOSIS — I509 Heart failure, unspecified: Secondary | ICD-10-CM

## 2022-07-28 DIAGNOSIS — I081 Rheumatic disorders of both mitral and tricuspid valves: Secondary | ICD-10-CM

## 2022-07-28 DIAGNOSIS — K921 Melena: Secondary | ICD-10-CM | POA: Diagnosis not present

## 2022-07-28 DIAGNOSIS — I110 Hypertensive heart disease with heart failure: Secondary | ICD-10-CM

## 2022-07-28 DIAGNOSIS — D649 Anemia, unspecified: Secondary | ICD-10-CM

## 2022-07-28 DIAGNOSIS — I959 Hypotension, unspecified: Secondary | ICD-10-CM | POA: Diagnosis not present

## 2022-07-28 DIAGNOSIS — R42 Dizziness and giddiness: Secondary | ICD-10-CM | POA: Diagnosis not present

## 2022-07-28 DIAGNOSIS — R55 Syncope and collapse: Secondary | ICD-10-CM | POA: Diagnosis not present

## 2022-07-28 DIAGNOSIS — I480 Paroxysmal atrial fibrillation: Secondary | ICD-10-CM | POA: Diagnosis not present

## 2022-07-28 HISTORY — PX: TEE WITHOUT CARDIOVERSION: SHX5443

## 2022-07-28 LAB — CBC
HCT: 33 % — ABNORMAL LOW (ref 36.0–46.0)
Hemoglobin: 11.1 g/dL — ABNORMAL LOW (ref 12.0–15.0)
MCH: 28.4 pg (ref 26.0–34.0)
MCHC: 33.6 g/dL (ref 30.0–36.0)
MCV: 84.4 fL (ref 80.0–100.0)
Platelets: 228 10*3/uL (ref 150–400)
RBC: 3.91 MIL/uL (ref 3.87–5.11)
RDW: 16.1 % — ABNORMAL HIGH (ref 11.5–15.5)
WBC: 7.2 10*3/uL (ref 4.0–10.5)
nRBC: 0.6 % — ABNORMAL HIGH (ref 0.0–0.2)

## 2022-07-28 LAB — ECHO TEE: Area-P 1/2: 2.48 cm2

## 2022-07-28 LAB — COMPREHENSIVE METABOLIC PANEL
ALT: 18 U/L (ref 0–44)
AST: 29 U/L (ref 15–41)
Albumin: 3.1 g/dL — ABNORMAL LOW (ref 3.5–5.0)
Alkaline Phosphatase: 76 U/L (ref 38–126)
Anion gap: 8 (ref 5–15)
BUN: 10 mg/dL (ref 8–23)
CO2: 22 mmol/L (ref 22–32)
Calcium: 8.5 mg/dL — ABNORMAL LOW (ref 8.9–10.3)
Chloride: 108 mmol/L (ref 98–111)
Creatinine, Ser: 0.93 mg/dL (ref 0.44–1.00)
GFR, Estimated: >60 mL/min (ref >60)
Glucose, Bld: 88 mg/dL (ref 70–99)
Potassium: 4.3 mmol/L (ref 3.5–5.1)
Sodium: 138 mmol/L (ref 135–145)
Total Bilirubin: 0.7 mg/dL (ref 0.3–1.2)
Total Protein: 6.4 g/dL — ABNORMAL LOW (ref 6.5–8.1)

## 2022-07-28 LAB — GLUCOSE, CAPILLARY
Glucose-Capillary: 85 mg/dL (ref 70–99)
Glucose-Capillary: 94 mg/dL (ref 70–99)
Glucose-Capillary: 88 mg/dL (ref 70–99)
Glucose-Capillary: 69 mg/dL — ABNORMAL LOW (ref 70–99)
Glucose-Capillary: 128 mg/dL — ABNORMAL HIGH (ref 70–99)

## 2022-07-28 LAB — PROTIME-INR
INR: 1.3 — ABNORMAL HIGH (ref 0.8–1.2)
Prothrombin Time: 15.8 seconds — ABNORMAL HIGH (ref 11.4–15.2)

## 2022-07-28 SURGERY — ECHOCARDIOGRAM, TRANSESOPHAGEAL
Anesthesia: Monitor Anesthesia Care

## 2022-07-28 MED ORDER — METHOCARBAMOL 500 MG PO TABS
500.0000 mg | ORAL_TABLET | Freq: Three times a day (TID) | ORAL | Status: DC | PRN
  Administered 2022-07-29 – 2022-07-30 (×4): 500 mg via ORAL
  Filled 2022-07-28 (×4): qty 1

## 2022-07-28 MED ORDER — SODIUM CHLORIDE 0.9 % IV SOLN: INTRAVENOUS | Status: DC | PRN

## 2022-07-28 MED ORDER — ALBUTEROL SULFATE (2.5 MG/3ML) 0.083% IN NEBU: 3.0000 mL | INHALATION_SOLUTION | Freq: Four times a day (QID) | RESPIRATORY_TRACT | Status: DC | PRN

## 2022-07-28 MED ORDER — ATORVASTATIN CALCIUM 10 MG PO TABS
20.0000 mg | ORAL_TABLET | Freq: Every day | ORAL | Status: DC
  Administered 2022-07-29 – 2022-07-31 (×3): 20 mg via ORAL
  Filled 2022-07-28 (×3): qty 2

## 2022-07-28 MED ORDER — LOSARTAN POTASSIUM 50 MG PO TABS
100.0000 mg | ORAL_TABLET | Freq: Every day | ORAL | Status: DC
  Administered 2022-07-28: 100 mg via ORAL
  Filled 2022-07-28: qty 2

## 2022-07-28 MED ORDER — ATORVASTATIN CALCIUM 10 MG PO TABS: 20.0000 mg | ORAL_TABLET | Freq: Every day | ORAL | Status: DC

## 2022-07-28 MED ORDER — ARIPIPRAZOLE 5 MG PO TABS
5.0000 mg | ORAL_TABLET | Freq: Every day | ORAL | Status: DC
  Administered 2022-07-29 – 2022-07-31 (×3): 5 mg via ORAL
  Filled 2022-07-28 (×3): qty 1

## 2022-07-28 MED ORDER — NICOTINE 21 MG/24HR TD PT24
21.0000 mg | MEDICATED_PATCH | Freq: Every day | TRANSDERMAL | Status: DC
  Administered 2022-07-29 – 2022-07-31 (×3): 21 mg via TRANSDERMAL
  Filled 2022-07-28 (×3): qty 1

## 2022-07-28 MED ORDER — INSULIN ASPART 100 UNIT/ML IJ SOLN: 0.0000 [IU] | Freq: Every day | INTRAMUSCULAR | Status: DC

## 2022-07-28 MED ORDER — CLONIDINE HCL 0.1 MG PO TABS
0.1000 mg | ORAL_TABLET | Freq: Once | ORAL | Status: AC
  Administered 2022-07-28: 0.1 mg via ORAL
  Filled 2022-07-28: qty 1

## 2022-07-28 MED ORDER — HYDRALAZINE HCL 25 MG PO TABS
25.0000 mg | ORAL_TABLET | Freq: Once | ORAL | Status: AC
  Administered 2022-07-28: 25 mg via ORAL
  Filled 2022-07-28: qty 1

## 2022-07-28 MED ORDER — INSULIN ASPART 100 UNIT/ML IJ SOLN: 0.0000 [IU] | Freq: Three times a day (TID) | INTRAMUSCULAR | Status: DC

## 2022-07-28 MED ORDER — LIDOCAINE 2% (20 MG/ML) 5 ML SYRINGE
INTRAMUSCULAR | Status: DC | PRN
  Administered 2022-07-28: 80 mg via INTRAVENOUS

## 2022-07-28 MED ORDER — OXYCODONE HCL 5 MG PO TABS: 15.0000 mg | ORAL_TABLET | Freq: Four times a day (QID) | ORAL | Status: DC | PRN

## 2022-07-28 MED ORDER — LOSARTAN POTASSIUM 50 MG PO TABS
100.0000 mg | ORAL_TABLET | Freq: Every day | ORAL | Status: DC
  Administered 2022-07-29 – 2022-07-31 (×3): 100 mg via ORAL
  Filled 2022-07-28 (×3): qty 2

## 2022-07-28 MED ORDER — PANTOPRAZOLE SODIUM 40 MG PO TBEC
40.0000 mg | DELAYED_RELEASE_TABLET | Freq: Every day | ORAL | Status: DC
  Administered 2022-07-29 – 2022-07-31 (×3): 40 mg via ORAL
  Filled 2022-07-28 (×3): qty 1

## 2022-07-28 MED ORDER — PROPOFOL 500 MG/50ML IV EMUL
INTRAVENOUS | Status: DC | PRN
  Administered 2022-07-28: 75 ug/kg/min via INTRAVENOUS

## 2022-07-28 MED ORDER — BUPROPION HCL ER (XL) 150 MG PO TB24
300.0000 mg | ORAL_TABLET | Freq: Every morning | ORAL | Status: DC
  Administered 2022-07-29 – 2022-07-31 (×3): 300 mg via ORAL
  Filled 2022-07-28 (×3): qty 2

## 2022-07-28 MED ORDER — METOPROLOL TARTRATE 25 MG PO TABS
25.0000 mg | ORAL_TABLET | Freq: Two times a day (BID) | ORAL | Status: DC
  Administered 2022-07-28: 25 mg via ORAL
  Filled 2022-07-28: qty 1

## 2022-07-28 MED ORDER — HYDRALAZINE HCL 25 MG PO TABS
25.0000 mg | ORAL_TABLET | Freq: Once | ORAL | Status: AC
Start: 2022-07-28 — End: 2022-07-28
  Administered 2022-07-28: 25 mg via ORAL
  Filled 2022-07-28: qty 1

## 2022-07-28 MED ORDER — ONDANSETRON HCL 4 MG/2ML IJ SOLN
4.0000 mg | Freq: Four times a day (QID) | INTRAMUSCULAR | Status: DC | PRN
  Administered 2022-07-29 – 2022-07-30 (×2): 4 mg via INTRAVENOUS
  Filled 2022-07-28: qty 2

## 2022-07-28 MED ORDER — HYDRALAZINE HCL 25 MG PO TABS: 25.0000 mg | ORAL_TABLET | Freq: Three times a day (TID) | ORAL | Status: DC

## 2022-07-28 MED ORDER — HYDRALAZINE HCL 20 MG/ML IJ SOLN
10.0000 mg | INTRAMUSCULAR | Status: DC | PRN
  Administered 2022-07-28: 10 mg via INTRAVENOUS
  Filled 2022-07-28: qty 1

## 2022-07-28 MED ORDER — PROPOFOL 10 MG/ML IV BOLUS
INTRAVENOUS | Status: DC | PRN
  Administered 2022-07-28: 40 mg via INTRAVENOUS

## 2022-07-28 MED ORDER — OXYCODONE HCL 5 MG PO TABS
15.0000 mg | ORAL_TABLET | Freq: Four times a day (QID) | ORAL | Status: DC | PRN
  Administered 2022-07-28 – 2022-07-31 (×12): 15 mg via ORAL
  Filled 2022-07-28 (×12): qty 3

## 2022-07-28 MED ORDER — GLYCOPYRROLATE 0.2 MG/ML IJ SOLN
INTRAMUSCULAR | Status: DC | PRN
  Administered 2022-07-28: .2 mg via INTRAVENOUS

## 2022-07-28 MED ORDER — ARIPIPRAZOLE 5 MG PO TABS: 5.0000 mg | ORAL_TABLET | Freq: Every day | ORAL | Status: DC

## 2022-07-28 MED ORDER — POLYVINYL ALCOHOL 1.4 % OP SOLN: 1.0000 [drp] | OPHTHALMIC | Status: DC | PRN

## 2022-07-28 MED ORDER — HYDROXYZINE HCL 25 MG PO TABS
25.0000 mg | ORAL_TABLET | ORAL | Status: DC | PRN
Start: 2022-07-28 — End: 2022-07-31
  Administered 2022-07-29: 25 mg via ORAL
  Filled 2022-07-28: qty 1

## 2022-07-28 MED ORDER — ESCITALOPRAM OXALATE 10 MG PO TABS
10.0000 mg | ORAL_TABLET | Freq: Every morning | ORAL | Status: DC
  Administered 2022-07-29 – 2022-07-31 (×3): 10 mg via ORAL
  Filled 2022-07-28 (×3): qty 1

## 2022-07-28 MED ORDER — ACETAMINOPHEN 650 MG RE SUPP: 650.0000 mg | Freq: Four times a day (QID) | RECTAL | Status: DC | PRN

## 2022-07-28 MED ORDER — GABAPENTIN 600 MG PO TABS
300.0000 mg | ORAL_TABLET | Freq: Three times a day (TID) | ORAL | Status: DC
  Administered 2022-07-28 – 2022-07-31 (×9): 300 mg via ORAL
  Filled 2022-07-28 (×9): qty 1

## 2022-07-28 MED ORDER — TRAZODONE HCL 100 MG PO TABS
200.0000 mg | ORAL_TABLET | Freq: Every evening | ORAL | Status: DC | PRN
  Administered 2022-07-28 – 2022-07-30 (×2): 200 mg via ORAL
  Filled 2022-07-28 (×2): qty 2

## 2022-07-28 NOTE — Transfer of Care (Signed)
Immediate Anesthesia Transfer of Care Note  Patient: [name]  Procedure(s) Performed: TRANSESOPHAGEAL ECHOCARDIOGRAM (TEE)  Patient Location: PACU and Endoscopy Unit  Anesthesia Type:MAC  Level of Consciousness: awake and patient cooperative  Airway & Oxygen Therapy: Patient Spontanous Breathing and Patient connected to nasal cannula oxygen  Post-op Assessment: Report given to RN and Post -op Vital signs reviewed and stable  Post vital signs: Reviewed and stable  Last Vitals:  Vitals Value Taken Time  BP 114/72   Temp    Pulse 74   Resp 12   SpO2 94     Last Pain:  Vitals:   07/28/22 1307  TempSrc:   PainSc: 7       Patients Stated Pain Goal: 2 (61/48/30 7354)  Complications: No notable events documented.

## 2022-07-28 NOTE — Anesthesia Preprocedure Evaluation (Addendum)
Anesthesia Evaluation  Patient identified by MRN, date of birth, ID band Patient awake    Reviewed: Allergy & Precautions, NPO status , Patient's Chart, lab work & pertinent test results  History of Anesthesia Complications Negative for: history of anesthetic complications  Airway Mallampati: II  TM Distance: >3 FB Neck ROM: Full    Dental no notable dental hx. (+) Dental Advisory Given   Pulmonary Current Smoker and Patient abstained from smoking.,    Pulmonary exam normal breath sounds clear to auscultation       Cardiovascular hypertension, Pt. on home beta blockers and Pt. on medications pulmonary hypertension+CHF and + DOE  Normal cardiovascular exam+ dysrhythmias Atrial Fibrillation + Valvular Problems/Murmurs MR  Rhythm:Regular Rate:Normal  Echo 07/2022  1. Left ventricular ejection fraction, by estimation, is 50 to 55%. The left ventricle has low normal function.  2. Left atrial size was severely dilated.  3. Right atrial size was moderately dilated.  4. S/p MV prosthetic annuloplasty ring placed 04/19/2022. mod-severe mitral stenosis, MV mean gradient 15 mmHg, peak 27 mmhg at HR 87 bpm. Mild mitral valve regurgitation. There is a present in the mitral position. Procedure Date: 04/19/22.  5. 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 Tricuspid valve regurgitation is moderate.  6. Aortic valve regurgitation is not visualized.  7. There is severely elevated pulmonary artery systolic pressure.    Cath 02/2022  -Mild pulmonary hypertension with mean PA pressure 31 mmHg.  Mean pulmonary wedge pressure 23 mmHg with a V wave 41 mmHg.  WHO group 2 pulmonary hypertension. Pulmonary vascular resistance 0.99 Woods units Left ventricular end diastolic pressure 35 mmHg, consistent with diastolic heart failure. Right dominant coronary anatomy.  Widely patent coronaries without obstructive  disease. Previously documented severe mitral regurgitation.  '23 TEE - EF 60 to 65%. Left atrial size was mildly dilated. Thickened MV leaflets ? rheumatic severe MR 3D images suggest ERO .4 cm2 with largest area of jet between P1/A1. Blunted systolic flow in PVls PISA 1 cm2 . The mitral valve is rheumatic. Severe mitral valve regurgitation. Thickened TV leaflets ? Rheumatic. Tricuspid valve regurgitation is severe.      Neuro/Psych PSYCHIATRIC DISORDERS Anxiety Depression  Neuromuscular disease    GI/Hepatic GERD  Medicated and Controlled,(+)     substance abuse (sober x 22 yrs)  , Hepatitis -, C  Endo/Other  diabetes, Type 2, Oral Hypoglycemic Agents  Renal/GU Renal disease     Musculoskeletal  (+) Arthritis , Osteoarthritis,  Fibromyalgia -  Abdominal   Peds  Hematology  (+) Blood dyscrasia, anemia ,   Anesthesia Other Findings   Reproductive/Obstetrics                           Anesthesia Physical  Anesthesia Plan  ASA: 4  Anesthesia Plan: MAC   Post-op Pain Management:    Induction: Intravenous  PONV Risk Score and Plan: 1 and Treatment may vary due to age or medical condition and Propofol infusion  Airway Management Planned: Oral ETT  Additional Equipment:   Intra-op Plan:   Post-operative Plan:   Informed Consent: I have reviewed the patients History and Physical, chart, labs and discussed the procedure including the risks, benefits and alternatives for the proposed anesthesia with the patient or authorized representative who has indicated his/her understanding and acceptance.     Dental advisory given  Plan Discussed with: CRNA  Anesthesia Plan Comments:  Anesthesia Quick Evaluation  

## 2022-07-28 NOTE — Progress Notes (Addendum)
Patient BP 187/129. MAP 145. Asymptomatic. MD notified. Awaiting response.  *duplicate note  Daymon Larsen, RN

## 2022-07-28 NOTE — Progress Notes (Addendum)
Pt BP 189/123 MAP 141. HR 109. Asymptomatic. No bp meds ordered. MD notified. Awaiting response.  4163 new orders placed and given  Daymon Larsen, RN

## 2022-07-28 NOTE — Hospital Course (Signed)
61 yo female with PMH atrial fibrillation/flutter on Coumadin, valvular heart disease status post mitral and tricuspid valve repair (annuloplasty ring) on 04/19/2022 with pericardial effusion s/p pericardiocentesis 05/10/22, DMII, GERD, HTN, chronic pain syndrome discharged from the hospital on 07/24/2022, readmitted for persistent dizziness and syncope.  Patient also reports that she had bright red blood per rectum with bowel movement once since discharge.  On arrival to ED she was hypotensive, bradycardic which have resolved with IV fluids.

## 2022-07-28 NOTE — Progress Notes (Addendum)
Progress Note  Patient Name: [name] Date of Encounter: 07/28/2022  Primary Cardiologist: Werner Lean, MD   Subjective   BP very high off her medications. Notes fatigue and chest pain. No syncope  Inpatient Medications    Scheduled Meds:  ARIPiprazole  5 mg Oral Daily   atorvastatin  20 mg Oral Daily   buPROPion  300 mg Oral q AM   escitalopram  10 mg Oral q morning   gabapentin  300 mg Oral TID   insulin aspart  0-15 Units Subcutaneous TID WC   losartan  100 mg Oral Daily   metoprolol tartrate  25 mg Oral BID   nicotine  21 mg Transdermal Daily   pantoprazole  40 mg Oral Daily   sodium chloride flush  3 mL Intravenous Q12H   Continuous Infusions:  sodium chloride 20 mL/hr at 07/28/22 0615   PRN Meds: acetaminophen **OR** acetaminophen, albuterol, hydrALAZINE, hydrOXYzine, methocarbamol, ondansetron **OR** ondansetron (ZOFRAN) IV, oxyCODONE, polyvinyl alcohol, traZODone   Vital Signs    Vitals:   07/28/22 0032 07/28/22 0417 07/28/22 0749 07/28/22 0902  BP: (!) 179/99 (!) 190/127 (!) 187/129 (!) 177/113  Pulse: (!) 102 (!) 104 (!) 102 (!) 103  Resp: '17 17  17  '$ Temp: 98 F (36.7 C) 98 F (36.7 C)    TempSrc: Oral Oral    SpO2: 91% 97%    Weight:  93 kg      Intake/Output Summary (Last 24 hours) at 07/28/2022 1109 Last data filed at 07/28/2022 0841 Gross per 24 hour  Intake 420 ml  Output 1000 ml  Net -580 ml   Filed Weights   07/28/22 0417  Weight: 93 kg    Telemetry    SR - Personally Reviewed  Physical Exam   General:  Well nourished, well developed, in no acute distress HEENT: normal Neck: no JVD Cardiac:  normal S1, S2; RRR; systolic and diastolic murmurs Lungs:  clear to auscultation bilaterally, no wheezing, rhonchi or rales  Abd: soft, nontender, no hepatomegaly  Ext: no edema Musculoskeletal:  No deformities, BUE and BLE strength normal and equal Skin: warm and dry  Neuro:  CNs 2-12 intact, no focal  abnormalities noted Psych:  Normal affect    Labs    Chemistry Recent Labs  Lab 07/23/22 0146 07/24/22 0322 07/26/22 1115 07/26/22 1149 07/27/22 0110 07/28/22 1023  NA 134*   < > 134* 135 137 138  K 4.8   < > 4.4 4.3 4.2 4.3  CL 105   < > 105 106 109 108  CO2 21*   < > 20*  --  22 22  GLUCOSE 79   < > 131* 129* 107* 88  BUN 29*   < > '23 23 19 10  '$ CREATININE 2.08*   < > 2.30* 2.40* 1.62* 0.93  CALCIUM 8.3*   < > 7.6*  --  7.6* 8.5*  PROT 6.6  --  5.5*  --   --  6.4*  ALBUMIN 3.5  --  2.9*  --   --  3.1*  AST 40  --  27  --   --  29  ALT 19  --  17  --   --  18  ALKPHOS 60  --  60  --   --  76  BILITOT 0.6  --  0.4  --   --  0.7  GFRNONAA 26*   < > 23*  --  36* >60  ANIONGAP 8   < >  9  --  6 8   < > = values in this interval not displayed.     Hematology Recent Labs  Lab 07/26/22 1901 07/27/22 0110 07/28/22 1023  WBC 5.1 6.2 7.2  RBC 3.31* 3.27* 3.91  HGB 9.4* 9.4* 11.1*  HCT 30.5* 29.4* 33.0*  MCV 92.1 89.9 84.4  MCH 28.4 28.7 28.4  MCHC 30.8 32.0 33.6  RDW 16.6* 16.5* 16.1*  PLT 219 204 228    Cardiac EnzymesNo results for input(s): "TROPONINI" in the last 168 hours. No results for input(s): "TROPIPOC" in the last 168 hours.   BNP Recent Labs  Lab 07/26/22 1115  BNP 699.1*     DDimer No results for input(s): "DDIMER" in the last 168 hours.   Radiology    ECHOCARDIOGRAM LIMITED  Result Date: 07/27/2022    ECHOCARDIOGRAM LIMITED REPORT   Patient Name:   [name] Date of Exam: 07/27/2022 Medical Rec #:  [id number]         Height:       71.0 in Accession #:    [id number]        Weight:       210.2 lb Date of Birth:  11/01/1961         BSA:          2.154 m Patient Age:    61 years          BP:           155/82 mmHg Patient Gender: F                 HR:           87 bpm. Exam Location:  Inpatient Procedure: Limited Echo, Limited Color Doppler and Cardiac Doppler Indications:    mitral valve disorder  History:        Patient has prior history of  Echocardiogram examinations, most                 recent 05/31/2022. CHF, Arrythmias:Bradycardia; Risk                 Factors:Dyslipidemia, Diabetes and Current Smoker.                  Mitral Valve: valve is present in the mitral position. Procedure                 Date: 04/19/22.  Sonographer:    Johny Chess RDCS Referring Phys: [id number] [place] A Eldwin Volkov IMPRESSIONS  1. Left ventricular ejection fraction, by estimation, is 50 to 55%. The left ventricle has low normal function.  2. Left atrial size was severely dilated.  3. Right atrial size was moderately dilated.  4. S/p MV prosthetic annuloplasty ring placed 04/19/2022. mod-severe mitral stenosis, MV mean gradient 15 mmHg, peak 27 mmhg at HR 87 bpm. Mild mitral valve regurgitation. There is a present in the mitral position. Procedure Date: 04/19/22.  5. 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 Tricuspid valve regurgitation is moderate.  6. Aortic valve regurgitation is not visualized.  7. There is severely elevated pulmonary artery systolic pressure. Conclusion(s)/Recommendation(s): Compared to prior study, MV mean gradient has increased. May be related to rate. Consider TEE/cardiac CT if clinically indicated. FINDINGS  Left Ventricle: Left ventricular ejection fraction, by estimation, is 50 to 55%. The left ventricle has low normal function. Right Ventricle: There is severely elevated pulmonary artery systolic pressure. The tricuspid regurgitant velocity is 3.74 m/s, and with an  assumed right atrial pressure of 5 mmHg, the estimated right ventricular systolic pressure is 30.8 mmHg. Left Atrium: Left atrial size was severely dilated. Right Atrium: Right atrial size was moderately dilated. Mitral Valve: S/p MV prosthetic annuloplasty ring placed 04/19/2022. mod-severe mitral stenosis, MV mean gradient 15 mmHg, peak 27 mmhg at HR 87 bpm. Mild mitral valve regurgitation. There is a present in the mitral position. Procedure Date:  04/19/22. MV peak gradient, 26.8 mmHg. The mean mitral valve gradient is 15.0 mmHg. Tricuspid Valve: Tricuspid valve regurgitation is moderate. 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 Aortic Valve: Aortic valve regurgitation is not visualized. Pulmonic Valve: Pulmonic valve regurgitation is not visualized. LEFT VENTRICLE PLAX 2D LVOT diam:     2.30 cm LV SV:         88 LV SV Index:   41 LVOT Area:     4.15 cm  IVC IVC diam: 2.20 cm AORTIC VALVE             PULMONIC VALVE LVOT Vmax:   114.50 cm/s RVOT Peak grad: 1 mmHg LVOT Vmean:  75.250 cm/s LVOT VTI:    0.213 m MITRAL VALVE              TRICUSPID VALVE MV Area VTI:  1.53 cm    TV Peak grad:   6.5 mmHg MV Peak grad: 26.8 mmHg   TV Mean grad:   4.0 mmHg MV Mean grad: 15.0 mmHg   TV Vmax:        1.27 m/s MV Vmax:      2.59 m/s    TV Vmean:       93.6 cm/s MV Vmean:     183.0 cm/s  TV VTI:         0.29 msec MR Peak grad: 149.3 mmHg  TR Peak grad:   56.0 mmHg MR Mean grad: 97.0 mmHg   TR Vmax:        374.00 cm/s MR Vmax:      611.00 cm/s MR Vmean:     469.0 cm/s  SHUNTS                           Systemic VTI:  0.21 m                           Systemic Diam: 2.30 cm                           Pulmonic VTI:  0.107 m Phineas Inches Electronically signed by Phineas Inches Signature Date/Time: 07/27/2022/12:28:20 PM    Final    DG Chest Port 1 View  Result Date: 07/26/2022 CLINICAL DATA:  Syncope EXAM: PORTABLE CHEST 1 VIEW COMPARISON:  07/22/2022 FINDINGS: Transverse diameter of heart is increased. Central pulmonary vessels are more prominent. Increased interstitial markings are seen in the parahilar regions and lower lung fields. There is no focal consolidation. There is no pleural effusion or pneumothorax. 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 IMPRESSION: Central pulmonary vessels are more prominent. Increased interstitial markings are seen in the parahilar regions and lower lung fields. Findings suggest CHF with  interstitial pulmonary edema. Electronically Signed   By: Elmer Picker M.D.   On: 07/26/2022 12:49   CT Head Wo Contrast  Result Date: 07/26/2022 CLINICAL DATA:  Head trauma, coagulopathy, syncopal episode striking head,  history MVR, d type II iabetes mellitus, hypertension, smoker EXAM: CT HEAD WITHOUT CONTRAST TECHNIQUE: Contiguous axial images were obtained from the base of the skull through the vertex without intravenous contrast. RADIATION DOSE REDUCTION: This exam was performed according to the departmental dose-optimization program which includes automated exposure control, adjustment of the mA and/or kV according to patient size and/or use of iterative reconstruction technique. COMPARISON:  05/22/2022 FINDINGS: Brain: Normal ventricular morphology. No midline shift or mass effect. Normal appearance of brain parenchyma. No intracranial hemorrhage, mass lesion, evidence of acute infarction, or extra-axial fluid collection. Few scattered nonspecific dural calcifications unchanged. Vascular: No hyperdense vessels. Skull: Intact Sinuses/Orbits: Clear Other: N/A IMPRESSION: No acute intracranial abnormalities. Electronically Signed   By: Lavonia Dana M.D.   On: 07/26/2022 12:27     Patient Profile     61 y.o. female presenting with complete heart block complicated by hypotension and acute kidney injury, background history of recent mitral and tricuspid valve repair, paroxysmal atrial fibrillation and atrial flutter on warfarin anticoagulation  Assessment & Plan     Syncope Hypertensive emergency rebound after bradycardia paroxsymal atrial fibrillation CHADSVASC NA Inflammatory/rheumatic heart disease S/p MVR and TVR both annuloplasty rings - seen by GI no concerns for bleeding, will resume coumadin if ok with GI-> Addendum: GI plans to scope, can hold warfarin - restart BB and losartan, given 1X dose hydralazine, may do live heart monitor - NPO for TEE concern for prosthetic valve  dysfunction - AKI has improved     For questions or updates, please contact [facility] Please consult [email or web address] for contact info under Cardiology/STEMI.      Rudean Haskell, MD [address] [place], [place] [address] [phone]  11:09 AM

## 2022-07-28 NOTE — Progress Notes (Addendum)
Progress Note   Subjective  Day #1 Chief Complaint: GI bleed  Today, the patient tells me that she had a stool earlier this morning and saw no further blood.  She is feeling well, but tells me she would like to go ahead and have her colonoscopy while in the hospital if possible.  Denies any other new GI complaints or concerns.  Does describe a chest heaviness this morning and feeling tired, but no acute pain.   Objective   Vital signs in last 24 hours: Temp:  [98 F (36.7 C)-98.5 F (36.9 C)] 98 F (36.7 C) (08/23 0417) Pulse Rate:  [87-104] 103 (08/23 0902) Resp:  [14-18] 17 (08/23 0902) BP: (155-190)/(82-129) 177/113 (08/23 0902) SpO2:  [91 %-97 %] 97 % (08/23 0417) Weight:  [93 kg] 93 kg (08/23 0417) Last BM Date : 07/26/22 General:    AA female in NAD Heart:  Regular rate and rhythm; no murmurs Lungs: Respirations even and unlabored, lungs CTA bilaterally Abdomen:  Soft, nontender and nondistended. Normal bowel sounds. Psych:  Cooperative. Normal mood and affect.  Intake/Output from previous day: 08/22 0701 - 08/23 0700 In: 80 [P.O.:420] Out: 2000 [Urine:2000]   Lab Results: Recent Labs    07/26/22 1901 07/27/22 0110 07/28/22 1023  WBC 5.1 6.2 7.2  HGB 9.4* 9.4* 11.1*  HCT 30.5* 29.4* 33.0*  PLT 219 204 228   BMET Recent Labs    07/26/22 1115 07/26/22 1149 07/27/22 0110  NA 134* 135 137  K 4.4 4.3 4.2  CL 105 106 109  CO2 20*  --  22  GLUCOSE 131* 129* 107*  BUN '23 23 19  '$ CREATININE 2.30* 2.40* 1.62*  CALCIUM 7.6*  --  7.6*   LFT Recent Labs    07/26/22 1115  PROT 5.5*  ALBUMIN 2.9*  AST 27  ALT 17  ALKPHOS 60  BILITOT 0.4   PT/INR Recent Labs    07/26/22 1115 07/27/22 0110  LABPROT 29.4* 27.1*  INR 2.8* 2.5*    Studies/Results: ECHOCARDIOGRAM LIMITED  Result Date: 07/27/2022    ECHOCARDIOGRAM LIMITED REPORT   Patient Name:   [name] Date of Exam: 07/27/2022 Medical Rec #:  [id number]         Height:       71.0 in  Accession #:    [id number]        Weight:       210.2 lb Date of Birth:  February 09, 1961         BSA:          2.154 m Patient Age:    61 years          BP:           155/82 mmHg Patient Gender: F                 HR:           87 bpm. Exam Location:  Inpatient Procedure: Limited Echo, Limited Color Doppler and Cardiac Doppler Indications:    mitral valve disorder  History:        Patient has prior history of Echocardiogram examinations, most                 recent 05/31/2022. CHF, Arrythmias:Bradycardia; Risk                 Factors:Dyslipidemia, Diabetes and Current Smoker.                  Mitral  Valve: valve is present in the mitral position. Procedure                 Date: 04/19/22.  Sonographer:    Johny Chess RDCS Referring Phys: [id number] [place] A CHANDRASEKHAR IMPRESSIONS  1. Left ventricular ejection fraction, by estimation, is 50 to 55%. The left ventricle has low normal function.  2. Left atrial size was severely dilated.  3. Right atrial size was moderately dilated.  4. S/p MV prosthetic annuloplasty ring placed 04/19/2022. mod-severe mitral stenosis, MV mean gradient 15 mmHg, peak 27 mmhg at HR 87 bpm. Mild mitral valve regurgitation. There is a present in the mitral position. Procedure Date: 04/19/22.  5. 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 Tricuspid valve regurgitation is moderate.  6. Aortic valve regurgitation is not visualized.  7. There is severely elevated pulmonary artery systolic pressure. Conclusion(s)/Recommendation(s): Compared to prior study, MV mean gradient has increased. May be related to rate. Consider TEE/cardiac CT if clinically indicated. FINDINGS  Left Ventricle: Left ventricular ejection fraction, by estimation, is 50 to 55%. The left ventricle has low normal function. Right Ventricle: There is severely elevated pulmonary artery systolic pressure. The tricuspid regurgitant velocity is 3.74 m/s, and with an assumed right atrial pressure of 5 mmHg, the  estimated right ventricular systolic pressure is 02.6 mmHg. Left Atrium: Left atrial size was severely dilated. Right Atrium: Right atrial size was moderately dilated. Mitral Valve: S/p MV prosthetic annuloplasty ring placed 04/19/2022. mod-severe mitral stenosis, MV mean gradient 15 mmHg, peak 27 mmhg at HR 87 bpm. Mild mitral valve regurgitation. There is a present in the mitral position. Procedure Date: 04/19/22. MV peak gradient, 26.8 mmHg. The mean mitral valve gradient is 15.0 mmHg. Tricuspid Valve: Tricuspid valve regurgitation is moderate. 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 Aortic Valve: Aortic valve regurgitation is not visualized. Pulmonic Valve: Pulmonic valve regurgitation is not visualized. LEFT VENTRICLE PLAX 2D LVOT diam:     2.30 cm LV SV:         88 LV SV Index:   41 LVOT Area:     4.15 cm  IVC IVC diam: 2.20 cm AORTIC VALVE             PULMONIC VALVE LVOT Vmax:   114.50 cm/s RVOT Peak grad: 1 mmHg LVOT Vmean:  75.250 cm/s LVOT VTI:    0.213 m MITRAL VALVE              TRICUSPID VALVE MV Area VTI:  1.53 cm    TV Peak grad:   6.5 mmHg MV Peak grad: 26.8 mmHg   TV Mean grad:   4.0 mmHg MV Mean grad: 15.0 mmHg   TV Vmax:        1.27 m/s MV Vmax:      2.59 m/s    TV Vmean:       93.6 cm/s MV Vmean:     183.0 cm/s  TV VTI:         0.29 msec MR Peak grad: 149.3 mmHg  TR Peak grad:   56.0 mmHg MR Mean grad: 97.0 mmHg   TR Vmax:        374.00 cm/s MR Vmax:      611.00 cm/s MR Vmean:     469.0 cm/s  SHUNTS                           Systemic  VTI:  0.21 m                           Systemic Diam: 2.30 cm                           Pulmonic VTI:  0.107 m Phineas Inches Electronically signed by Phineas Inches Signature Date/Time: 07/27/2022/12:28:20 PM    Final    DG Chest Port 1 View  Result Date: 07/26/2022 CLINICAL DATA:  Syncope EXAM: PORTABLE CHEST 1 VIEW COMPARISON:  07/22/2022 FINDINGS: Transverse diameter of heart is increased. Central pulmonary vessels are more prominent.  Increased interstitial markings are seen in the parahilar regions and lower lung fields. There is no focal consolidation. There is no pleural effusion or pneumothorax. 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 IMPRESSION: Central pulmonary vessels are more prominent. Increased interstitial markings are seen in the parahilar regions and lower lung fields. Findings suggest CHF with interstitial pulmonary edema. Electronically Signed   By: Elmer Picker M.D.   On: 07/26/2022 12:49   CT Head Wo Contrast  Result Date: 07/26/2022 CLINICAL DATA:  Head trauma, coagulopathy, syncopal episode striking head, history MVR, d type II iabetes mellitus, hypertension, smoker EXAM: CT HEAD WITHOUT CONTRAST TECHNIQUE: Contiguous axial images were obtained from the base of the skull through the vertex without intravenous contrast. RADIATION DOSE REDUCTION: This exam was performed according to the departmental dose-optimization program which includes automated exposure control, adjustment of the mA and/or kV according to patient size and/or use of iterative reconstruction technique. COMPARISON:  05/22/2022 FINDINGS: Brain: Normal ventricular morphology. No midline shift or mass effect. Normal appearance of brain parenchyma. No intracranial hemorrhage, mass lesion, evidence of acute infarction, or extra-axial fluid collection. Few scattered nonspecific dural calcifications unchanged. Vascular: No hyperdense vessels. Skull: Intact Sinuses/Orbits: Clear Other: N/A IMPRESSION: No acute intracranial abnormalities. Electronically Signed   By: Lavonia Dana M.D.   On: 07/26/2022 12:27       Assessment / Plan:   Assessment: 1.  Acute on chronic anemia with hematochezia: Baseline hemoglobin around 10, 7.5 at admission--> 1 unit PRBCs--> 8.5--> 9.4--> 7.1 (question erroneous), described 1 episode of bright red blood in the stool, none since and had normal bowel movement this morning, previous work-up in  2018 for IDA, AVMs in the small bowel 2.  A-fib: Typically on Coumadin, on hold since 07/26/2022 3.  Chronic systolic heart failure: Echo 05/31/2022 with EF 50-55% 4.  Chronic pain syndrome 5.  AKI on stage III CKD 6.  Dizziness  Plan: 1.  Patient has had no further GI bleeding and hemoglobin is stable after 1 unit of PRBCs.  Discussed option of colonoscopy +/- push enteroscopy with the patient and she would like to have these done while she is in the hospital if possible.  We will need to wait until her INR is less than 1.8 2.  Recheck CBC, CMP and INR today 3.  We will likely follow peripherally over the coming days until she is able to have procedures, please call us with any acute concerns.  Thank you for your kind consultation.   LOS: 0 days   Levin Erp  07/28/2022, 10:57 AM    Attending Physician Note   I have taken an interval history, reviewed the chart and examined the patient. I performed a substantive portion of this encounter, including complete performance of at least one of  the key components, in conjunction with the APP. I agree with the APP's note, impression and recommendations with my edits. My additional impressions and recommendations are as follows.   Hematochezia with acute on chronic anemia. History of SB AVMs. No bleeding overnight. Colonoscopy +/- SBE when PT/INR allows.   Lucio Edward, MD [facility] See AMION, [facility], for our on call provider

## 2022-07-28 NOTE — Anesthesia Procedure Notes (Signed)
Procedure Name: MAC Date/Time: 07/28/2022 1:22 PM  Performed by: Lowella Dell, CRNAPre-anesthesia Checklist: Patient identified, Emergency Drugs available, Suction available, Patient being monitored and Timeout performed Patient Re-evaluated:Patient Re-evaluated prior to induction Oxygen Delivery Method: Nasal cannula Placement Confirmation: positive ETCO2 Dental Injury: Teeth and Oropharynx as per pre-operative assessment

## 2022-07-28 NOTE — Progress Notes (Signed)
Inpatient Diabetes Program Recommendations  AACE/ADA: New Consensus Statement on Inpatient Glycemic Control (2015)  Target Ranges:  Prepandial:   less than 140 mg/dL      Peak postprandial:   less than 180 mg/dL (1-2 hours)      Critically ill patients:  140 - 180 mg/dL   Lab Results  Component Value Date   GLUCAP 69 (L) 07/28/2022   HGBA1C 6.0 (H) 07/22/2022    Review of Glycemic Control  Latest Reference Range & Units 07/27/22 16:27 07/27/22 21:29 07/28/22 04:05 07/28/22 11:04 07/28/22 15:44  Glucose-Capillary 70 - 99 mg/dL 112 (H) 83 85 94 69 (L)   Diabetes history: DM 2 Outpatient Diabetes medications:  Tradjenta 5 mg q AM Current orders for Inpatient glycemic control:  Novolog 0-15 units tid with meals  Inpatient Diabetes Program Recommendations:    Recommend reducing Novolog correction to very sensitive (0-6 units) tid with meals.   Thanks,  Adah Perl, RN, BC-ADM Inpatient Diabetes Coordinator Pager [phone]  (8a-5p)

## 2022-07-28 NOTE — Progress Notes (Signed)
Progress Note   Patient: [name] AST:[id number] DOB: 1961/03/31 DOA: 07/26/2022     0 DOS: the patient was seen and examined on 07/28/2022   Brief hospital course: 61 yo female with PMH atrial fibrillation/flutter on Coumadin, valvular heart disease status post mitral and tricuspid valve repair (annuloplasty ring) on 04/19/2022 with pericardial effusion s/p pericardiocentesis 05/10/22, DMII, GERD, HTN, chronic pain syndrome discharged from the hospital on 07/24/2022, readmitted for persistent dizziness and syncope.  Patient also reports that she had bright red blood per rectum with bowel movement once since discharge.  On arrival to ED she was hypotensive, bradycardic which have resolved with IV fluids.  Assessment and Plan: Dizziness and lightheadedness, probable syncopal episode initially suspected secondary to overdose of oxycodone versus bradycardia and hypotension. -Patient was on metoprolol which was discontinued this admission.   -Diltiazem was discontinued prior to discharge. -cardiology consulted and pt scheduled of TEE today    Acute kidney injury on stage IIIa CKD -Secondary to prerenal azotemia from hypotension holding Cozaar for now.   -Her blood pressure improved with fluid boluses and creatinine has improved to 0.93    Acute anemia of blood loss Possibly lower GI bleed. Her baseline hemoglobin around 10 and she was admitted with a hemoglobin of 7.5, s/p 1 unit of PRBC transfusion.  And repeat hemoglobin is 9.4. GI consulted with Coumadin on hold..  Her INR is 2.5    Atrial fibrillation Rate controlled INR is 2.5 Coumadin on hold for concern for rectal bleeding. Rate controlling drugs have been held due to bradycardia and dizziness and syncope.    Chronic pain syndrome Patient had been on oxycodone 7.5 mg every 8 hours but she reports that he has been taking 15 mg every 8 hours in addition to methocarbamol.  She is at risk for opioid misuse and overdose and suspect  her symptoms could partly be due to taking oxycodone 15 mg every 8 hours. Requesting increased pain meds NCCSR reviewed. Pt had been prescribed 120 tabs/30 days, which corresponds to 4 tabs/day Will increase oxycodone to q6h PRN    Type 2 diabetes mellitus Well-controlled CBGs and, recent a1c of 6.0 Continue with sliding scale insulin and gabapentin    COPD -Patient reports that she is smoking and continues to smoke.   -No audible wheezing on exam -Nicotine patch order. -On minimal O2 support    Mental health disorder:  -Adjusted medications due to risk of serotonin syndrome.     Hyperlipidemia:  -Continue with lipitor.       Subjective: Feeling eager to have TEE   Physical Exam: Vitals:   07/28/22 1400 07/28/22 1405 07/28/22 1410 07/28/22 1415  BP: (!) 118/99  130/86 (!) 143/83  Pulse: 71 72 72 72  Resp: '19 19 15 '$ (!) 22  Temp:      TempSrc:      SpO2: 95% 96% 98% 99%  Weight:      Height:       General exam: Awake, laying in bed, in nad Respiratory system: Normal respiratory effort, no wheezing Cardiovascular system: regular rate, s1, s2 Gastrointestinal system: Soft, nondistended, positive BS Central nervous system: CN2-12 grossly intact, strength intact Extremities: Perfused, no clubbing Skin: Normal skin turgor, no notable skin lesions seen Psychiatry: Mood normal // no visual hallucinations   Data Reviewed:  Labs reviewed: Na 138, K 4.3, Cr 0.93   Family Communication: Pt in room, family not at bedside  Disposition: Status is: Observation The patient remains OBS appropriate and will  d/c before 2 midnights.  Planned Discharge Destination: Home    Author: Marylu Lund, MD 07/28/2022 3:12 PM  For on call review [email or web address].

## 2022-07-28 NOTE — Progress Notes (Signed)
Patient complains of pain in multiple locations, including chest pain. BP 139/124. All BP meds given. Oxy '15mg'$ , gabapentin '300mg'$ , robaxin '500mg'$  all given. Asking for additional pain meds. MD made aware.   Daymon Larsen, RN

## 2022-07-28 NOTE — Op Note (Signed)
INDICATIONS: malfunction of valve repair  PROCEDURE:   Informed consent was obtained prior to the procedure. The risks, benefits and alternatives for the procedure were discussed and the patient comprehended these risks.  Risks include, but are not limited to, cough, sore throat, vomiting, nausea, somnolence, esophageal and stomach trauma or perforation, bleeding, low blood pressure, aspiration, pneumonia, infection, trauma to the teeth and death.    After a procedural time-out, the oropharynx was anesthetized with 20% benzocaine spray.   During this procedure the patient was administered IV propofol by Anesthesiology, Dr. Lissa Hoard  The transesophageal probe was inserted in the esophagus and stomach without difficulty and multiple views were obtained.  The patient was kept under observation until the patient left the procedure room.  The patient left the procedure room in stable condition.   Agitated microbubble saline contrast was not administered.  COMPLICATIONS:    There were no immediate complications.  FINDINGS:  Normal appearance of MV annuloplasty repair with mild (1+) regurgitation and mildly increased gradients (probably due to tachycardia/high cardiac output). Moderate to severe tricuspid insufficiency despite annuloplasty repair. Normal LVEF. No vegetations or thrombi are seen.  RECOMMENDATIONS:     Medical management. Avoid anemia/tachycardia.  Time Spent Directly with the Patient:  30 minutes   [name] 07/28/2022, 1:40 PM

## 2022-07-28 NOTE — Progress Notes (Signed)
Echocardiogram Echocardiogram Transesophageal has been performed.  [name] 07/28/2022, 1:57 PM

## 2022-07-28 NOTE — Evaluation (Signed)
Occupational Therapy Evaluation Patient Details Name: [name] MRN: [id number] DOB: 06-Jul-1961 Today's Date: 07/28/2022   History of Present Illness Pt is a 61 y/o female admitted secondary to dizziness and syncope. PMH includes tricuspid valve repair 04/19/2022, tamponade s/p pericardiocentesis on 05/10/2022, afib, HTN, HLD, GERD, hepatitis C, chronic pain, CHF, afib   Clinical Impression   Pt admitted with the above diagnosis and has the deficits listed below. Pt would benefit from acute OT at this time to increase level of independence to mod I so she can d/c back home alone. Pt has lived alone for sometime now and states she spends 21/24 hours a day in the bed. Pt only gets up for doctor appts, to eat and use the bathroom. Pt is most limited by chronic pain.  Spoke with her at length about how this might be contributing to some of her pain and encouraged more activity at home.  Pt states she is not a fan of therapy at OP or at home but did encourage her to give it a try to attempt to be more mobile and control the pain. Pt is safe with adls with use of rolling walker.  Will continue to see acutely but pt currently states that post acute therapy is not what she wants.         Recommendations for follow up therapy are one component of a multi-disciplinary discharge planning process, led by the attending physician.  Recommendations may be updated based on patient status, additional functional criteria and insurance authorization.   Follow Up Recommendations  No OT follow up    Assistance Recommended at Discharge Intermittent Supervision/Assistance  Patient can return home with the following Assistance with cooking/housework    Functional Status Assessment  Patient has had a recent decline in their functional status and demonstrates the ability to make significant improvements in function in a reasonable and predictable amount of time.  Equipment Recommendations  None recommended by OT     Recommendations for Other Services       Precautions / Restrictions Precautions Precautions: Fall Precaution Comments: Pt states this is first fall she has had in a long time Restrictions Weight Bearing Restrictions: No Other Position/Activity Restrictions: Chronic pain in back, hips and knees      Mobility Bed Mobility Overal bed mobility: Modified Independent             General bed mobility comments: Increased time.    Transfers Overall transfer level: Needs assistance Equipment used: Rolling walker (2 wheels) Transfers: Sit to/from Stand, Bed to chair/wheelchair/BSC Sit to Stand: Min guard     Step pivot transfers: Min guard     General transfer comment: Min guard for safety.      Balance Overall balance assessment: Mild deficits observed, not formally tested                                         ADL either performed or assessed with clinical judgement   ADL Overall ADL's : Needs assistance/impaired Eating/Feeding: Independent;Sitting   Grooming: Wash/dry hands;Wash/dry face;Oral care;Min guard;Standing Grooming Details (indicate cue type and reason): pt stood at sink for 2 minutes before asking to return to bed due to pain.  Pt also with high BP (nursing aware and gave meds) so mobility was limited. Upper Body Bathing: Set up;Sitting   Lower Body Bathing: Min guard;Sit to/from stand   Upper Body  Dressing : Set up;Sitting   Lower Body Dressing: Min guard;Sit to/from stand;Cueing for compensatory techniques Lower Body Dressing Details (indicate cue type and reason): cued pt to proper body mechanics due to back pain. Toilet Transfer: Min guard;Ambulation;BSC/3in1;Rolling walker (2 wheels) Toilet Transfer Details (indicate cue type and reason): Pt took for steps to [facility] with walker Toileting- Clothing Manipulation and Hygiene: Supervision/safety;Sit to/from stand;Cueing for compensatory techniques       Functional mobility during  ADLs: Min guard;Rolling walker (2 wheels) General ADL Comments: Pt appears to be close to baseline with adls. Pt has significant pain with all mobility, but appears safe when using walker. Pt spends 95% of her day in bed which may contribute to her pain in all joints.  Spoke with her at length about increasing her activity level at home and considering therapy.     Vision Baseline Vision/History: 0 No visual deficits Ability to See in Adequate Light: 0 Adequate Patient Visual Report: No change from baseline Vision Assessment?: No apparent visual deficits     Perception     Praxis      Pertinent Vitals/Pain Pain Assessment Pain Assessment: 0-10 Pain Score: 8  Pain Location: back, neck, arms, hips Pain Descriptors / Indicators: Grimacing, Guarding Pain Intervention(s): Limited activity within patient's tolerance, Premedicated before session, Repositioned, Monitored during session, Relaxation     Hand Dominance Right   Extremity/Trunk Assessment Upper Extremity Assessment Upper Extremity Assessment: RUE deficits/detail;LUE deficits/detail RUE Deficits / Details: Limited AROM to 100 due to pain RUE: Unable to fully assess due to pain RUE Sensation: WNL RUE Coordination: decreased gross motor LUE Deficits / Details: limited shoulder flex to 100 due to pain LUE: Unable to fully assess due to pain LUE Sensation: WNL LUE Coordination: decreased gross motor   Lower Extremity Assessment Lower Extremity Assessment: Defer to PT evaluation   Cervical / Trunk Assessment Cervical / Trunk Assessment: Normal   Communication Communication Communication: No difficulties   Cognition Arousal/Alertness: Awake/alert Behavior During Therapy: WFL for tasks assessed/performed Overall Cognitive Status: Within Functional Limits for tasks assessed                                       General Comments  Pt most limited by pain and high BP 166/110 at end of session     Exercises     Shoulder Instructions      Home Living Family/patient expects to be discharged to:: Private residence Living Arrangements: Alone Available Help at Discharge: Available PRN/intermittently Type of Home: Apartment Home Access: Level entry     Home Layout: One level     Bathroom Shower/Tub: Teacher, early years/pre: Standard Bathroom Accessibility: Yes How Accessible: Accessible via walker Home Equipment: Rollator (4 wheels);Cane - single point;BSC/3in1;Wheelchair - manual;Grab bars - tub/shower;Shower seat;Tub bench;Hand held shower head   Additional Comments: Pt is in bed most of the day due to pain and it has been this way for years.  Pt states she spends about 22/24 hours a day in bed. Only gets up for food, bathroom and doctor's appts.  Talked to her at length about how this might contribute to her joint pain.      Prior Functioning/Environment Prior Level of Function : Independent/Modified Independent;Driving             Mobility Comments: USing rollator most of the time; uses WC when feeling dizzy ADLs Comments: sponge bathes  OT Problem List: Decreased strength;Decreased range of motion;Decreased activity tolerance;Impaired balance (sitting and/or standing);Impaired UE functional use;Pain      OT Treatment/Interventions: Self-care/ADL training;DME and/or AE instruction;Therapeutic activities;Balance training    OT Goals(Current goals can be found in the care plan section) Acute Rehab OT Goals Patient Stated Goal: to get rid of the pain OT Goal Formulation: With patient Time For Goal Achievement: 08/11/22 Potential to Achieve Goals: Fair ADL Goals Additional ADL Goal #1: Pt will walk to bathroom with rolling walker and complete all toieting with mod I Additional ADL Goal #2: Pt will gather all clothing with walker and dress at EOB with mod I Additional ADL Goal #3: Pt will state 3 things she can do differently at home to increase  activity level and time out of bed without cues.  OT Frequency: Min 2X/week    Co-evaluation              AM-PAC OT "6 Clicks" Daily Activity     Outcome Measure Help from another person eating meals?: None Help from another person taking care of personal grooming?: None Help from another person toileting, which includes using toliet, bedpan, or urinal?: A Lot Help from another person bathing (including washing, rinsing, drying)?: None Help from another person to put on and taking off regular upper body clothing?: A Little Help from another person to put on and taking off regular lower body clothing?: A Little 6 Click Score: 20   End of Session Equipment Utilized During Treatment: Rolling walker (2 wheels) Nurse Communication: Mobility status  Activity Tolerance: Patient limited by pain Patient left: in bed;with call bell/phone within reach  OT Visit Diagnosis: Unsteadiness on feet (R26.81)                Time: 8295-6213 OT Time Calculation (min): 23 min Charges:  OT General Charges $OT Visit: 1 Visit OT Evaluation $OT Eval Moderate Complexity: 1 Mod  [name] 07/28/2022, 9:13 AM

## 2022-07-28 NOTE — Progress Notes (Deleted)
Echocardiogram 2D Echocardiogram has been performed.  [name] 07/28/2022, 1:57 PM

## 2022-07-29 ENCOUNTER — Encounter (HOSPITAL_COMMUNITY): Payer: Self-pay | Admitting: Cardiovascular Disease

## 2022-07-29 DIAGNOSIS — R7989 Other specified abnormal findings of blood chemistry: Secondary | ICD-10-CM | POA: Diagnosis present

## 2022-07-29 DIAGNOSIS — K922 Gastrointestinal hemorrhage, unspecified: Secondary | ICD-10-CM | POA: Diagnosis present

## 2022-07-29 DIAGNOSIS — G894 Chronic pain syndrome: Secondary | ICD-10-CM | POA: Diagnosis not present

## 2022-07-29 DIAGNOSIS — K921 Melena: Secondary | ICD-10-CM | POA: Diagnosis not present

## 2022-07-29 DIAGNOSIS — D123 Benign neoplasm of transverse colon: Secondary | ICD-10-CM | POA: Diagnosis not present

## 2022-07-29 DIAGNOSIS — D62 Acute posthemorrhagic anemia: Secondary | ICD-10-CM | POA: Diagnosis present

## 2022-07-29 DIAGNOSIS — K640 First degree hemorrhoids: Secondary | ICD-10-CM | POA: Diagnosis not present

## 2022-07-29 DIAGNOSIS — K5521 Angiodysplasia of colon with hemorrhage: Secondary | ICD-10-CM | POA: Diagnosis present

## 2022-07-29 DIAGNOSIS — N1831 Chronic kidney disease, stage 3a: Secondary | ICD-10-CM | POA: Diagnosis present

## 2022-07-29 DIAGNOSIS — Z7901 Long term (current) use of anticoagulants: Secondary | ICD-10-CM | POA: Diagnosis not present

## 2022-07-29 DIAGNOSIS — I5022 Chronic systolic (congestive) heart failure: Secondary | ICD-10-CM | POA: Diagnosis present

## 2022-07-29 DIAGNOSIS — I959 Hypotension, unspecified: Secondary | ICD-10-CM | POA: Diagnosis present

## 2022-07-29 DIAGNOSIS — I161 Hypertensive emergency: Secondary | ICD-10-CM | POA: Diagnosis present

## 2022-07-29 DIAGNOSIS — I480 Paroxysmal atrial fibrillation: Secondary | ICD-10-CM | POA: Diagnosis present

## 2022-07-29 DIAGNOSIS — B182 Chronic viral hepatitis C: Secondary | ICD-10-CM | POA: Diagnosis present

## 2022-07-29 DIAGNOSIS — T402X1A Poisoning by other opioids, accidental (unintentional), initial encounter: Secondary | ICD-10-CM | POA: Diagnosis present

## 2022-07-29 DIAGNOSIS — D122 Benign neoplasm of ascending colon: Secondary | ICD-10-CM | POA: Diagnosis not present

## 2022-07-29 DIAGNOSIS — I110 Hypertensive heart disease with heart failure: Secondary | ICD-10-CM | POA: Diagnosis not present

## 2022-07-29 DIAGNOSIS — Z86010 Personal history of colonic polyps: Secondary | ICD-10-CM | POA: Diagnosis not present

## 2022-07-29 DIAGNOSIS — R55 Syncope and collapse: Secondary | ICD-10-CM | POA: Diagnosis present

## 2022-07-29 DIAGNOSIS — D120 Benign neoplasm of cecum: Secondary | ICD-10-CM | POA: Diagnosis not present

## 2022-07-29 DIAGNOSIS — Z952 Presence of prosthetic heart valve: Secondary | ICD-10-CM | POA: Diagnosis not present

## 2022-07-29 DIAGNOSIS — R001 Bradycardia, unspecified: Secondary | ICD-10-CM | POA: Diagnosis present

## 2022-07-29 DIAGNOSIS — J449 Chronic obstructive pulmonary disease, unspecified: Secondary | ICD-10-CM | POA: Diagnosis present

## 2022-07-29 DIAGNOSIS — R42 Dizziness and giddiness: Secondary | ICD-10-CM | POA: Diagnosis present

## 2022-07-29 DIAGNOSIS — Y929 Unspecified place or not applicable: Secondary | ICD-10-CM | POA: Diagnosis not present

## 2022-07-29 DIAGNOSIS — I442 Atrioventricular block, complete: Secondary | ICD-10-CM | POA: Diagnosis present

## 2022-07-29 DIAGNOSIS — F17210 Nicotine dependence, cigarettes, uncomplicated: Secondary | ICD-10-CM | POA: Diagnosis not present

## 2022-07-29 DIAGNOSIS — K635 Polyp of colon: Secondary | ICD-10-CM | POA: Diagnosis not present

## 2022-07-29 DIAGNOSIS — I4892 Unspecified atrial flutter: Secondary | ICD-10-CM | POA: Diagnosis present

## 2022-07-29 DIAGNOSIS — D649 Anemia, unspecified: Secondary | ICD-10-CM | POA: Diagnosis not present

## 2022-07-29 DIAGNOSIS — Z20822 Contact with and (suspected) exposure to covid-19: Secondary | ICD-10-CM | POA: Diagnosis present

## 2022-07-29 DIAGNOSIS — K5520 Angiodysplasia of colon without hemorrhage: Secondary | ICD-10-CM | POA: Diagnosis not present

## 2022-07-29 DIAGNOSIS — N179 Acute kidney failure, unspecified: Secondary | ICD-10-CM | POA: Diagnosis present

## 2022-07-29 DIAGNOSIS — F32A Depression, unspecified: Secondary | ICD-10-CM | POA: Diagnosis present

## 2022-07-29 DIAGNOSIS — I5032 Chronic diastolic (congestive) heart failure: Secondary | ICD-10-CM | POA: Diagnosis not present

## 2022-07-29 DIAGNOSIS — I130 Hypertensive heart and chronic kidney disease with heart failure and stage 1 through stage 4 chronic kidney disease, or unspecified chronic kidney disease: Secondary | ICD-10-CM | POA: Diagnosis present

## 2022-07-29 DIAGNOSIS — E1140 Type 2 diabetes mellitus with diabetic neuropathy, unspecified: Secondary | ICD-10-CM | POA: Diagnosis present

## 2022-07-29 DIAGNOSIS — E1122 Type 2 diabetes mellitus with diabetic chronic kidney disease: Secondary | ICD-10-CM | POA: Diagnosis present

## 2022-07-29 LAB — GLUCOSE, CAPILLARY
Glucose-Capillary: 104 mg/dL — ABNORMAL HIGH (ref 70–99)
Glucose-Capillary: 108 mg/dL — ABNORMAL HIGH (ref 70–99)
Glucose-Capillary: 77 mg/dL (ref 70–99)
Glucose-Capillary: 107 mg/dL — ABNORMAL HIGH (ref 70–99)

## 2022-07-29 LAB — CBC
HCT: 29.8 % — ABNORMAL LOW (ref 36.0–46.0)
Hemoglobin: 10 g/dL — ABNORMAL LOW (ref 12.0–15.0)
MCH: 28.7 pg (ref 26.0–34.0)
MCHC: 33.6 g/dL (ref 30.0–36.0)
MCV: 85.4 fL (ref 80.0–100.0)
Platelets: 252 10*3/uL (ref 150–400)
RBC: 3.49 MIL/uL — ABNORMAL LOW (ref 3.87–5.11)
RDW: 16.4 % — ABNORMAL HIGH (ref 11.5–15.5)
WBC: 5.8 10*3/uL (ref 4.0–10.5)
nRBC: 0.3 % — ABNORMAL HIGH (ref 0.0–0.2)

## 2022-07-29 LAB — COMPREHENSIVE METABOLIC PANEL
ALT: 16 U/L (ref 0–44)
AST: 20 U/L (ref 15–41)
Albumin: 2.8 g/dL — ABNORMAL LOW (ref 3.5–5.0)
Alkaline Phosphatase: 65 U/L (ref 38–126)
Anion gap: 7 (ref 5–15)
BUN: 11 mg/dL (ref 8–23)
CO2: 24 mmol/L (ref 22–32)
Calcium: 8.2 mg/dL — ABNORMAL LOW (ref 8.9–10.3)
Chloride: 107 mmol/L (ref 98–111)
Creatinine, Ser: 1.13 mg/dL — ABNORMAL HIGH (ref 0.44–1.00)
GFR, Estimated: 55 mL/min — ABNORMAL LOW (ref >60)
Glucose, Bld: 91 mg/dL (ref 70–99)
Potassium: 4.2 mmol/L (ref 3.5–5.1)
Sodium: 138 mmol/L (ref 135–145)
Total Bilirubin: 0.3 mg/dL (ref 0.3–1.2)
Total Protein: 5.7 g/dL — ABNORMAL LOW (ref 6.5–8.1)

## 2022-07-29 LAB — PROTIME-INR
INR: 1.3 — ABNORMAL HIGH (ref 0.8–1.2)
Prothrombin Time: 16.5 seconds — ABNORMAL HIGH (ref 11.4–15.2)

## 2022-07-29 MED ORDER — PEG-KCL-NACL-NASULF-NA ASC-C 100 G PO SOLR: 1.0000 | Freq: Once | ORAL | Status: DC

## 2022-07-29 MED ORDER — METOPROLOL TARTRATE 50 MG PO TABS
50.0000 mg | ORAL_TABLET | Freq: Two times a day (BID) | ORAL | Status: DC
  Administered 2022-07-29 – 2022-07-31 (×5): 50 mg via ORAL
  Filled 2022-07-29 (×5): qty 1

## 2022-07-29 MED ORDER — FUROSEMIDE 20 MG PO TABS
20.0000 mg | ORAL_TABLET | Freq: Every day | ORAL | Status: DC
  Administered 2022-07-29 – 2022-07-31 (×3): 20 mg via ORAL
  Filled 2022-07-29 (×3): qty 1

## 2022-07-29 MED ORDER — SODIUM CHLORIDE 0.9 % IV SOLN: INTRAVENOUS | Status: DC

## 2022-07-29 MED ORDER — PEG-KCL-NACL-NASULF-NA ASC-C 100 G PO SOLR
0.5000 | Freq: Once | ORAL | Status: AC
  Administered 2022-07-29: 100 g via ORAL
  Filled 2022-07-29: qty 1

## 2022-07-29 NOTE — Progress Notes (Addendum)
Progress Note   Subjective  Day #2 Chief Complaint: GI bleed  Today, patient tells me she has not had another bowel movement since her regular 1 yesterday.  She is feeling well with no acute complaints or concerns.  Aware that we are planning for colonoscopy tomorrow since her INR is now normal.   Objective   Vital signs in last 24 hours: Temp:  [97.3 F (36.3 C)-98 F (36.7 C)] 98 F (36.7 C) (08/24 0652) Pulse Rate:  [71-112] 85 (08/24 0900) Resp:  [15-25] 18 (08/24 0900) BP: (114-163)/(72-108) 128/80 (08/24 0900) SpO2:  [84 %-99 %] 90 % (08/24 0900) Weight:  [93 kg] 93 kg (08/23 1347) Last BM Date : 07/26/22 General:    AA female in NAD Heart:  Regular rate and rhythm; no murmurs Lungs: Respirations even and unlabored, lungs CTA bilaterally Abdomen:  Soft, nontender and nondistended. Normal bowel sounds. Psych:  Cooperative. Normal mood and affect.  Intake/Output from previous day: 08/23 0701 - 08/24 0700 In: 254.3 [P.O.:120; I.V.:134.3] Out: 700 [Urine:700]   Lab Results: Recent Labs    07/27/22 0110 07/28/22 1023 07/29/22 0451  WBC 6.2 7.2 5.8  HGB 9.4* 11.1* 10.0*  HCT 29.4* 33.0* 29.8*  PLT 204 228 252   BMET Recent Labs    07/27/22 0110 07/28/22 1023 07/29/22 0451  NA 137 138 138  K 4.2 4.3 4.2  CL 109 108 107  CO2 '22 22 24  '$ GLUCOSE 107* 88 91  BUN '19 10 11  '$ CREATININE 1.62* 0.93 1.13*  CALCIUM 7.6* 8.5* 8.2*   LFT Recent Labs    07/29/22 0451  PROT 5.7*  ALBUMIN 2.8*  AST 20  ALT 16  ALKPHOS 65  BILITOT 0.3   PT/INR Recent Labs    07/28/22 1537 07/29/22 0451  LABPROT 15.8* 16.5*  INR 1.3* 1.3*    Studies/Results: ECHO TEE  Result Date: 07/28/2022    TRANSESOPHOGEAL ECHO REPORT   Patient Name:   CARRY WEESNER Date of Exam: 07/28/2022 Medical Rec #:  [id number]         Height:       71.0 in Accession #:    [id number]        Weight:       205.0 lb Date of Birth:  1961-10-05         BSA:          2.131 m Patient Age:    18  years          BP:           114/72 mmHg Patient Gender: F                 HR:           83 bpm. Exam Location:  Inpatient Procedure: Transesophageal Echo, 3D Echo, Color Doppler and Cardiac Doppler Indications:     elevated mitral valve gradients  History:         Patient has prior history of Echocardiogram examinations, most                  recent 07/27/2022. Risk Factors:Diabetes, Hypertension, GERD and                  Current Smoker.                   Mitral Valve: prosthetic annuloplasty ring valve is present in  the mitral position. Procedure Date: 04/19/22.  Sonographer:     Bernadene Person RDCS Referring Phys:  [id number] [facility] A Gasper Sells Diagnosing Phys: Sanda Klein MD PROCEDURE: After discussion of the risks and benefits of a TEE, an informed consent was obtained from the patient. The transesophogeal probe was passed without difficulty through the esophogus of the patient. Sedation performed by different physician. The patient was monitored while under deep sedation. Anesthestetic sedation was provided intravenously by Anesthesiology: 137.'65mg'$  of Propofol, '80mg'$  of Lidocaine. The patient's vital signs; including heart rate, blood pressure, and oxygen saturation; remained stable throughout the procedure. The patient developed no complications during the procedure. IMPRESSIONS  1. Left ventricular ejection fraction, by estimation, is 60 to 65%. The left ventricle has normal function. The left ventricle has no regional wall motion abnormalities. There is mild concentric left ventricular hypertrophy. Left ventricular diastolic function could not be evaluated.  2. Right ventricular systolic function is normal. The right ventricular size is normal. There is moderately elevated pulmonary artery systolic pressure. The estimated right ventricular systolic pressure is 29.5 mmHg.  3. Left atrial size was severely dilated. No left atrial/left atrial appendage thrombus was detected. The LAA emptying  velocity was 70 cm/s.  4. Right atrial size was mildly dilated.  5. The mitral valve has been repaired/replaced. Mild mitral valve regurgitation. Mild mitral stenosis. The mean mitral valve gradient is 6.6 mmHg with average heart rate of 83 bpm. 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 Procedure Date: 04/19/22.  6. The tricuspid valve is has been repaired/replaced. 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 Tricuspid valve regurgitation is moderate.  7. The aortic valve is tricuspid. Aortic valve regurgitation is not visualized. No aortic stenosis is present. Comparison(s): A "double envelope" or "ghosting" artifact is seen during CW interrogation of the mitral inflow. This may have led to overestimation of the transmitral gradient on the previous study. FINDINGS  Left Ventricle: Left ventricular ejection fraction, by estimation, is 60 to 65%. The left ventricle has normal function. The left ventricle has no regional wall motion abnormalities. The left ventricular internal cavity size was normal in size. There is  mild concentric left ventricular hypertrophy. Left ventricular diastolic function could not be evaluated due to mitral valve repair. Left ventricular diastolic function could not be evaluated. Right Ventricle: The right ventricular size is normal. No increase in right ventricular wall thickness. Right ventricular systolic function is normal. There is moderately elevated pulmonary artery systolic pressure. The tricuspid regurgitant velocity is 3.52 m/s, and with an assumed right atrial pressure of 5 mmHg, the estimated right ventricular systolic pressure is 28.4 mmHg. Left Atrium: Left atrial size was severely dilated. No left atrial/left atrial appendage thrombus was detected. The LAA emptying velocity was 70 cm/s. Right Atrium: Right atrial size was mildly dilated. Pericardium: There is no evidence of pericardial effusion. Mitral Valve: The mitral  valve has been repaired/replaced. Mild mitral valve regurgitation, with centrally-directed jet. 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 Procedure Date: 04/19/22. Mild mitral valve stenosis. The mean  mitral valve gradient is 6.6 mmHg with average heart rate of 83 bpm. Tricuspid Valve: The tricuspid valve is has been repaired/replaced. Tricuspid valve regurgitation is moderate. 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 Aortic Valve: The aortic valve is tricuspid. Aortic valve regurgitation is not visualized. No aortic stenosis is present. Pulmonic Valve: The pulmonic valve was normal in structure. Pulmonic valve regurgitation  is not visualized. Aorta: The aortic root, ascending aorta, aortic arch and descending aorta are all structurally normal, with no evidence of dilitation or obstruction. There is minimal (Grade I) plaque. IAS/Shunts: No atrial level shunt detected by color flow Doppler.  MITRAL VALVE             TRICUSPID VALVE MV Area (PHT): 2.48 cm  TV Area (PHT):  2.26 cm MV Mean grad:  6.6 mmHg  TV Mean grad:   1.3 mmHg MV Decel Time: 306 msec  TV PHT:         97 msec                          TR Peak grad:   49.6 mmHg                          TR Vmax:        352.00 cm/s Dani Gobble Croitoru MD Electronically signed by Sanda Klein MD Signature Date/Time: 07/28/2022/2:32:07 PM    Final    ECHOCARDIOGRAM LIMITED  Result Date: 07/27/2022    ECHOCARDIOGRAM LIMITED REPORT   Patient Name:   MILIANA GANGWER Date of Exam: 07/27/2022 Medical Rec #:  [id number]         Height:       71.0 in Accession #:    [id number]        Weight:       210.2 lb Date of Birth:  1961/10/25         BSA:          2.154 m Patient Age:    89 years          BP:           155/82 mmHg Patient Gender: F                 HR:           87 bpm. Exam Location:  Inpatient Procedure: Limited Echo, Limited Color Doppler and Cardiac Doppler Indications:    mitral valve disorder  History:         Patient has prior history of Echocardiogram examinations, most                 recent 05/31/2022. CHF, Arrythmias:Bradycardia; Risk                 Factors:Dyslipidemia, Diabetes and Current Smoker.                  Mitral Valve: valve is present in the mitral position. Procedure                 Date: 04/19/22.  Sonographer:    Johny Chess RDCS Referring Phys: [id number] [place] A CHANDRASEKHAR IMPRESSIONS  1. Left ventricular ejection fraction, by estimation, is 50 to 55%. The left ventricle has low normal function.  2. Left atrial size was severely dilated.  3. Right atrial size was moderately dilated.  4. S/p MV prosthetic annuloplasty ring placed 04/19/2022. mod-severe mitral stenosis, MV mean gradient 15 mmHg, peak 27 mmhg at HR 87 bpm. Mild mitral valve regurgitation. There is a present in the mitral position. Procedure Date: 04/19/22.  5. 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 Tricuspid valve regurgitation is moderate.  6. Aortic valve regurgitation is not visualized.  7. There is severely elevated pulmonary artery systolic pressure. Conclusion(s)/Recommendation(s): Compared to prior study, MV  mean gradient has increased. May be related to rate. Consider TEE/cardiac CT if clinically indicated. FINDINGS  Left Ventricle: Left ventricular ejection fraction, by estimation, is 50 to 55%. The left ventricle has low normal function. Right Ventricle: There is severely elevated pulmonary artery systolic pressure. The tricuspid regurgitant velocity is 3.74 m/s, and with an assumed right atrial pressure of 5 mmHg, the estimated right ventricular systolic pressure is 74.2 mmHg. Left Atrium: Left atrial size was severely dilated. Right Atrium: Right atrial size was moderately dilated. Mitral Valve: S/p MV prosthetic annuloplasty ring placed 04/19/2022. mod-severe mitral stenosis, MV mean gradient 15 mmHg, peak 27 mmhg at HR 87 bpm. Mild mitral valve regurgitation. There is a present in the  mitral position. Procedure Date: 04/19/22. MV peak gradient, 26.8 mmHg. The mean mitral valve gradient is 15.0 mmHg. Tricuspid Valve: Tricuspid valve regurgitation is moderate. 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 Aortic Valve: Aortic valve regurgitation is not visualized. Pulmonic Valve: Pulmonic valve regurgitation is not visualized. LEFT VENTRICLE PLAX 2D LVOT diam:     2.30 cm LV SV:         88 LV SV Index:   41 LVOT Area:     4.15 cm  IVC IVC diam: 2.20 cm AORTIC VALVE             PULMONIC VALVE LVOT Vmax:   114.50 cm/s RVOT Peak grad: 1 mmHg LVOT Vmean:  75.250 cm/s LVOT VTI:    0.213 m MITRAL VALVE              TRICUSPID VALVE MV Area VTI:  1.53 cm    TV Peak grad:   6.5 mmHg MV Peak grad: 26.8 mmHg   TV Mean grad:   4.0 mmHg MV Mean grad: 15.0 mmHg   TV Vmax:        1.27 m/s MV Vmax:      2.59 m/s    TV Vmean:       93.6 cm/s MV Vmean:     183.0 cm/s  TV VTI:         0.29 msec MR Peak grad: 149.3 mmHg  TR Peak grad:   56.0 mmHg MR Mean grad: 97.0 mmHg   TR Vmax:        374.00 cm/s MR Vmax:      611.00 cm/s MR Vmean:     469.0 cm/s  SHUNTS                           Systemic VTI:  0.21 m                           Systemic Diam: 2.30 cm                           Pulmonic VTI:  0.107 m Phineas Inches Electronically signed by Phineas Inches Signature Date/Time: 07/27/2022/12:28:20 PM    Final       Assessment / Plan:   Assessment: 1.  Acute on chronic anemia with hematochezia: Baseline hemoglobin around 10, 7.5 at admission--> 1 unit PRBCs--> 8.5--> 9.4--> 11.1--> 10.0, 1 episode of hematochezia at admission, none since, overdue for colonoscopy; consider hemorrhoids versus other 2.  A-fib: Typically on Coumadin, on hold since 07/26/2022, INR now 1.3 3.  Chronic systolic heart failure: Echo 05/31/2022 with EF 50 to 55% 4.  Chronic  pain syndrome 5.  AKI on stage III CKD 6.  Dizziness  Plan: 1.  We will schedule patient for colonoscopy tomorrow given that she is overdue for  surveillance and had an episode of hematochezia here in the hospital.  No plans for small bowel enteroscopy given the patient has had no further bleeding and her hemoglobin is stable.  Did go ahead and discussed risks, benefits, limitations and alternatives patient she agrees to proceed. 2.  Patient be a clear liquid diet today and n.p.o. at midnight 3.  Patient will start bowel prep in split dose fashion later this afternoon 4.  Continue other supportive measures 5.  Continue to hold Coumadin until recommendations after colonoscopy tomorrow  Thank you for your kind consultation, we will continue to follow.   LOS: 0 days   Levin Erp  07/29/2022, 11:22 AM    Attending Physician Note   I have taken an interval history, reviewed the chart and examined the patient. I performed a substantive portion of this encounter, including complete performance of at least one of the key components, in conjunction with the APP. I agree with the APP's note, impression and recommendations with my edits. My additional impressions and recommendations are as follows.   Acute on chronic anemia and hematochezia. INR=1.3 today and yesterday. Colonoscopy is scheduled on Friday. Continue to hold Coumadin.    Lucio Edward, MD [facility] See [facility], [facility], for our on call provider

## 2022-07-29 NOTE — Anesthesia Postprocedure Evaluation (Signed)
Anesthesia Post Note  Patient: [name]  Procedure(s) Performed: TRANSESOPHAGEAL ECHOCARDIOGRAM (TEE)     Patient location during evaluation: PACU Anesthesia Type: MAC Level of consciousness: awake and alert Pain management: pain level controlled Vital Signs Assessment: post-procedure vital signs reviewed and stable Respiratory status: spontaneous breathing Cardiovascular status: stable Anesthetic complications: no   No notable events documented.  Last Vitals:  Vitals:   07/29/22 0900 07/29/22 1216  BP: 128/80 (!) 157/108  Pulse: 85 90  Resp: 18   Temp:    SpO2: 90% 95%    Last Pain:  Vitals:   07/29/22 1159  TempSrc:   PainSc: [place]

## 2022-07-29 NOTE — Progress Notes (Signed)
Progress Note  Patient Name: [name] Date of Encounter: 07/29/2022  Primary Cardiologist: Werner Lean, MD   Subjective   Increased gradients but not principally driving by prosthetic valve dysfunction.   Tricuspid regurgitation is at least moderate but without PVL BP has improved.  Inpatient Medications    Scheduled Meds:  ARIPiprazole  5 mg Oral Daily   atorvastatin  20 mg Oral Daily   buPROPion  300 mg Oral q AM   escitalopram  10 mg Oral q morning   gabapentin  300 mg Oral TID   insulin aspart  0-5 Units Subcutaneous QHS   insulin aspart  0-6 Units Subcutaneous TID WC   losartan  100 mg Oral Daily   metoprolol tartrate  25 mg Oral BID   nicotine  21 mg Transdermal Daily   pantoprazole  40 mg Oral Daily   Continuous Infusions:   PRN Meds: acetaminophen, albuterol, hydrOXYzine, methocarbamol, ondansetron (ZOFRAN) IV, oxyCODONE, polyvinyl alcohol, traZODone   Vital Signs    Vitals:   07/28/22 2000 07/28/22 2010 07/29/22 0104 07/29/22 0652  BP: 118/76 118/76 114/75 (!) 154/97  Pulse: (!) 112  90 94  Resp: '18 18 18 18  '$ Temp: 98 F (36.7 C) 98 F (36.7 C) 98 F (36.7 C) 98 F (36.7 C)  TempSrc: Oral Oral Oral Oral  SpO2: 92% 92% (!) 85% 91%  Weight:      Height:        Intake/Output Summary (Last 24 hours) at 07/29/2022 0759 Last data filed at 07/28/2022 1700 Gross per 24 hour  Intake 254.29 ml  Output 700 ml  Net -445.71 ml   Filed Weights   07/28/22 0417 07/28/22 1347  Weight: 93 kg 93 kg    Telemetry    SR to sinus tachycardia - Personally Reviewed  Physical Exam   General:  Well nourished, well developed, in no acute distress HEENT: normal Neck: no JVD Cardiac:  normal S1, S2; RRR; systolic and diastolic murmurs Lungs:  clear to auscultation bilaterally, no wheezing, rhonchi or rales  Abd: soft, nontender, no hepatomegaly  Ext: no edema Musculoskeletal:  No deformities, BUE and BLE strength normal and equal Skin:  warm and dry  Neuro:  CNs 2-12 intact, no focal abnormalities noted Psych:  Normal affect pleasant mood   Labs    Chemistry Recent Labs  Lab 07/26/22 1115 07/26/22 1149 07/27/22 0110 07/28/22 1023 07/29/22 0451  NA 134*   < > 137 138 138  K 4.4   < > 4.2 4.3 4.2  CL 105   < > 109 108 107  CO2 20*  --  '22 22 24  '$ GLUCOSE 131*   < > 107* 88 91  BUN 23   < > '19 10 11  '$ CREATININE 2.30*   < > 1.62* 0.93 1.13*  CALCIUM 7.6*  --  7.6* 8.5* 8.2*  PROT 5.5*  --   --  6.4* 5.7*  ALBUMIN 2.9*  --   --  3.1* 2.8*  AST 27  --   --  29 20  ALT 17  --   --  18 16  ALKPHOS 60  --   --  76 65  BILITOT 0.4  --   --  0.7 0.3  GFRNONAA 23*  --  36* >60 55*  ANIONGAP 9  --  '6 8 7   '$ < > = values in this interval not displayed.     Hematology Recent Labs  Lab 07/27/22  0110 07/28/22 1023 07/29/22 0451  WBC 6.2 7.2 5.8  RBC 3.27* 3.91 3.49*  HGB 9.4* 11.1* 10.0*  HCT 29.4* 33.0* 29.8*  MCV 89.9 84.4 85.4  MCH 28.7 28.4 28.7  MCHC 32.0 33.6 33.6  RDW 16.5* 16.1* 16.4*  PLT 204 228 252    Cardiac EnzymesNo results for input(s): "TROPONINI" in the last 168 hours. No results for input(s): "TROPIPOC" in the last 168 hours.   BNP Recent Labs  Lab 07/26/22 1115  BNP 699.1*     DDimer No results for input(s): "DDIMER" in the last 168 hours.   Radiology    ECHO TEE  Result Date: 07/28/2022    TRANSESOPHOGEAL ECHO REPORT   Patient Name:   [name] Date of Exam: 07/28/2022 Medical Rec #:  [id number]         Height:       71.0 in Accession #:    [id number]        Weight:       205.0 lb Date of Birth:  1961-09-27         BSA:          2.131 m Patient Age:    61 years          BP:           114/72 mmHg Patient Gender: F                 HR:           83 bpm. Exam Location:  Inpatient Procedure: Transesophageal Echo, 3D Echo, Color Doppler and Cardiac Doppler Indications:     elevated mitral valve gradients  History:         Patient has prior history of Echocardiogram examinations,  most                  recent 07/27/2022. Risk Factors:Diabetes, Hypertension, GERD and                  Current Smoker.                   Mitral Valve: prosthetic annuloplasty ring valve is present in                  the mitral position. Procedure Date: 04/19/22.  Sonographer:     Bernadene Person RDCS Referring Phys:  [id number] [facility] A Gasper Sells Diagnosing Phys: Sanda Klein MD PROCEDURE: After discussion of the risks and benefits of a TEE, an informed consent was obtained from the patient. The transesophogeal probe was passed without difficulty through the esophogus of the patient. Sedation performed by different physician. The patient was monitored while under deep sedation. Anesthestetic sedation was provided intravenously by Anesthesiology: 137.'65mg'$  of Propofol, '80mg'$  of Lidocaine. The patient's vital signs; including heart rate, blood pressure, and oxygen saturation; remained stable throughout the procedure. The patient developed no complications during the procedure. IMPRESSIONS  1. Left ventricular ejection fraction, by estimation, is 60 to 65%. The left ventricle has normal function. The left ventricle has no regional wall motion abnormalities. There is mild concentric left ventricular hypertrophy. Left ventricular diastolic function could not be evaluated.  2. Right ventricular systolic function is normal. The right ventricular size is normal. There is moderately elevated pulmonary artery systolic pressure. The estimated right ventricular systolic pressure is 96.2 mmHg.  3. Left atrial size was severely dilated. No left atrial/left atrial appendage thrombus was detected. The LAA emptying velocity was 70 cm/s.  4. Right  atrial size was mildly dilated.  5. The mitral valve has been repaired/replaced. Mild mitral valve regurgitation. Mild mitral stenosis. The mean mitral valve gradient is 6.6 mmHg with average heart rate of 83 bpm. 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  Procedure Date: 04/19/22.  6. The tricuspid valve is has been repaired/replaced. 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 Tricuspid valve regurgitation is moderate.  7. The aortic valve is tricuspid. Aortic valve regurgitation is not visualized. No aortic stenosis is present. Comparison(s): A "double envelope" or "ghosting" artifact is seen during CW interrogation of the mitral inflow. This may have led to overestimation of the transmitral gradient on the previous study. FINDINGS  Left Ventricle: Left ventricular ejection fraction, by estimation, is 60 to 65%. The left ventricle has normal function. The left ventricle has no regional wall motion abnormalities. The left ventricular internal cavity size was normal in size. There is  mild concentric left ventricular hypertrophy. Left ventricular diastolic function could not be evaluated due to mitral valve repair. Left ventricular diastolic function could not be evaluated. Right Ventricle: The right ventricular size is normal. No increase in right ventricular wall thickness. Right ventricular systolic function is normal. There is moderately elevated pulmonary artery systolic pressure. The tricuspid regurgitant velocity is 3.52 m/s, and with an assumed right atrial pressure of 5 mmHg, the estimated right ventricular systolic pressure is 11.9 mmHg. Left Atrium: Left atrial size was severely dilated. No left atrial/left atrial appendage thrombus was detected. The LAA emptying velocity was 70 cm/s. Right Atrium: Right atrial size was mildly dilated. Pericardium: There is no evidence of pericardial effusion. Mitral Valve: The mitral valve has been repaired/replaced. Mild mitral valve regurgitation, with centrally-directed jet. 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 Procedure Date: 04/19/22. Mild mitral valve stenosis. The mean  mitral valve gradient is 6.6 mmHg with average heart rate of 83 bpm. Tricuspid Valve: The  tricuspid valve is has been repaired/replaced. Tricuspid valve regurgitation is moderate. 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 Aortic Valve: The aortic valve is tricuspid. Aortic valve regurgitation is not visualized. No aortic stenosis is present. Pulmonic Valve: The pulmonic valve was normal in structure. Pulmonic valve regurgitation is not visualized. Aorta: The aortic root, ascending aorta, aortic arch and descending aorta are all structurally normal, with no evidence of dilitation or obstruction. There is minimal (Grade I) plaque. IAS/Shunts: No atrial level shunt detected by color flow Doppler.  MITRAL VALVE             TRICUSPID VALVE MV Area (PHT): 2.48 cm  TV Area (PHT):  2.26 cm MV Mean grad:  6.6 mmHg  TV Mean grad:   1.3 mmHg MV Decel Time: 306 msec  TV PHT:         97 msec                          TR Peak grad:   49.6 mmHg                          TR Vmax:        352.00 cm/s Sanda Klein MD Electronically signed by Sanda Klein MD Signature Date/Time: 07/28/2022/2:32:07 PM    Final    ECHOCARDIOGRAM LIMITED  Result Date: 07/27/2022    ECHOCARDIOGRAM LIMITED REPORT   Patient Name:   KENIA TEAGARDEN  Date of Exam: 07/27/2022 Medical Rec #:  [id number]         Height:       71.0 in Accession #:    [id number]        Weight:       210.2 lb Date of Birth:  [birth year]         BSA:          2.154 m Patient Age:    52 years          BP:           155/82 mmHg Patient Gender: F                 HR:           87 bpm. Exam Location:  Inpatient Procedure: Limited Echo, Limited Color Doppler and Cardiac Doppler Indications:    mitral valve disorder  History:        Patient has prior history of Echocardiogram examinations, most                 recent 05/31/2022. CHF, Arrythmias:Bradycardia; Risk                 Factors:Dyslipidemia, Diabetes and Current Smoker.                  Mitral Valve: valve is present in the mitral position. Procedure                 Date: 04/19/22.   Sonographer:    Johny Chess RDCS Referring Phys: [id number] [place] A Lashauna Arpin IMPRESSIONS  1. Left ventricular ejection fraction, by estimation, is 50 to 55%. The left ventricle has low normal function.  2. Left atrial size was severely dilated.  3. Right atrial size was moderately dilated.  4. S/p MV prosthetic annuloplasty ring placed 04/19/2022. mod-severe mitral stenosis, MV mean gradient 15 mmHg, peak 27 mmhg at HR 87 bpm. Mild mitral valve regurgitation. There is a present in the mitral position. Procedure Date: 04/19/22.  5. 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 Tricuspid valve regurgitation is moderate.  6. Aortic valve regurgitation is not visualized.  7. There is severely elevated pulmonary artery systolic pressure. Conclusion(s)/Recommendation(s): Compared to prior study, MV mean gradient has increased. May be related to rate. Consider TEE/cardiac CT if clinically indicated. FINDINGS  Left Ventricle: Left ventricular ejection fraction, by estimation, is 50 to 55%. The left ventricle has low normal function. Right Ventricle: There is severely elevated pulmonary artery systolic pressure. The tricuspid regurgitant velocity is 3.74 m/s, and with an assumed right atrial pressure of 5 mmHg, the estimated right ventricular systolic pressure is 95.2 mmHg. Left Atrium: Left atrial size was severely dilated. Right Atrium: Right atrial size was moderately dilated. Mitral Valve: S/p MV prosthetic annuloplasty ring placed 04/19/2022. mod-severe mitral stenosis, MV mean gradient 15 mmHg, peak 27 mmhg at HR 87 bpm. Mild mitral valve regurgitation. There is a present in the mitral position. Procedure Date: 04/19/22. MV peak gradient, 26.8 mmHg. The mean mitral valve gradient is 15.0 mmHg. Tricuspid Valve: Tricuspid valve regurgitation is moderate. 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 Aortic Valve: Aortic valve regurgitation is not visualized. Pulmonic  Valve: Pulmonic valve regurgitation is not visualized. LEFT VENTRICLE PLAX 2D LVOT diam:     2.30 cm LV SV:         88 LV SV Index:   41 LVOT Area:  4.15 cm  IVC IVC diam: 2.20 cm AORTIC VALVE             PULMONIC VALVE LVOT Vmax:   114.50 cm/s RVOT Peak grad: 1 mmHg LVOT Vmean:  75.250 cm/s LVOT VTI:    0.213 m MITRAL VALVE              TRICUSPID VALVE MV Area VTI:  1.53 cm    TV Peak grad:   6.5 mmHg MV Peak grad: 26.8 mmHg   TV Mean grad:   4.0 mmHg MV Mean grad: 15.0 mmHg   TV Vmax:        1.27 m/s MV Vmax:      2.59 m/s    TV Vmean:       93.6 cm/s MV Vmean:     183.0 cm/s  TV VTI:         0.29 msec MR Peak grad: 149.3 mmHg  TR Peak grad:   56.0 mmHg MR Mean grad: 97.0 mmHg   TR Vmax:        374.00 cm/s MR Vmax:      611.00 cm/s MR Vmean:     469.0 cm/s  SHUNTS                           Systemic VTI:  0.21 m                           Systemic Diam: 2.30 cm                           Pulmonic VTI:  0.107 m Phineas Inches Electronically signed by Phineas Inches Signature Date/Time: 07/27/2022/12:28:20 PM    Final      Patient Profile     61 y.o. female presenting with complete heart block complicated by hypotension and acute kidney injury, background history of recent mitral and tricuspid valve repair, paroxysmal atrial fibrillation and atrial flutter on warfarin anticoagulation  Assessment & Plan    Syncope Hypertensive emergency rebound after bradycardia paroxsymal atrial fibrillation CHADSVASC NA Inflammatory/rheumatic heart disease S/p MVR and TVR both annuloplasty rings AKI - When clear by GI would restart warfarin (she is potentially getting BI procedure 07/30/22) - AKI has largely resolved; she still has significant TR and and  - on metoprolol, will increase to 50 mg PO BID; I would recommend heart monitor at discharge (live if we are able to do so) - losartan 100 mg PO daily       For questions or updates, please contact [facility] Please consult [email or web address] for contact  info under Cardiology/STEMI.      Rudean Haskell, MD [address], [address] [place], [place] [address] [phone]  7:59 AM

## 2022-07-29 NOTE — Progress Notes (Signed)
Encouraged to keep drinking her Moviprep. Pt still has 300 mls to go.

## 2022-07-29 NOTE — Progress Notes (Signed)
Progress Note   Patient: [name] VOZ:[id number] DOB: Jun 12, 1961 DOA: 07/26/2022     0 DOS: the patient was seen and examined on 07/29/2022   Brief hospital course: 61 yo female with PMH atrial fibrillation/flutter on Coumadin, valvular heart disease status post mitral and tricuspid valve repair (annuloplasty ring) on 04/19/2022 with pericardial effusion s/p pericardiocentesis 05/10/22, DMII, GERD, HTN, chronic pain syndrome discharged from the hospital on 07/24/2022, readmitted for persistent dizziness and syncope.  Patient also reports that she had bright red blood per rectum with bowel movement once since discharge.  On arrival to ED she was hypotensive, bradycardic which have resolved with IV fluids.  Assessment and Plan: Dizziness and lightheadedness, probable syncopal episode initially suspected secondary to overdose of oxycodone versus bradycardia and hypotension. -Patient was on metoprolol which was discontinued this admission.   -Diltiazem was discontinued prior to discharge. -cardiology following. Pt s/p TEE 8/23 with findings of mod to severe TR, recs to avoid anemia/tachycardia. Recs to cont metoprolol and to restart warfarin when clear by GI    Acute kidney injury on stage IIIa CKD -Secondary to prerenal azotemia from hypotension holding Cozaar for now.   -Her blood pressure improved with fluid boluses  -Cr currently 1.13    Acute anemia of blood loss Possibly lower GI bleed. Her baseline hemoglobin around 10 and she was admitted with a hemoglobin of 7.5, s/p 1 unit of PRBC transfusion.   Hgb stable at 10.0 GI consulted with Coumadin on hold. Cardiology recs to resume coumadin when clear by GI    Atrial fibrillation Rate controlled Coumadin on hold for concern for rectal bleeding. Rate controlling drugs have been held due to bradycardia and dizziness and syncope. Resume coumadin when clear by GI    Chronic pain syndrome Patient had been on oxycodone 7.5 mg every 8  hours but she reports that he has been taking 15 mg every 8 hours in addition to methocarbamol.  She is at risk for opioid misuse and overdose and suspect her symptoms could partly be due to taking oxycodone 15 mg every 8 hours. Requesting increased pain meds NCCSR reviewed. Pt had been prescribed 120 tabs/30 days, which corresponds to 4 tabs/day Now on oxycodone to q6h PRN    Type 2 diabetes mellitus Well-controlled CBGs and, recent a1c of 6.0 Continue with sliding scale insulin and gabapentin    COPD -Patient reports that she is smoking and continues to smoke.   -No audible wheezing on exam -Nicotine patch order. -On minimal O2 support    Mental health disorder:  -Adjusted medications due to risk of serotonin syndrome.     Hyperlipidemia:  -Continue with lipitor.       Subjective: Without complaints this AM  Physical Exam: Vitals:   07/29/22 0104 07/29/22 0652 07/29/22 0900 07/29/22 1216  BP: 114/75 (!) 154/97 128/80 (!) 157/108  Pulse: 90 94 85 90  Resp: '18 18 18   '$ Temp: 98 F (36.7 C) 98 F (36.7 C)    TempSrc: Oral Oral    SpO2: (!) 85% 91% 90% 95%  Weight:      Height:       General exam: Conversant, in no acute distress Respiratory system: normal chest rise, clear, no audible wheezing Cardiovascular system: regular rhythm, s1-s2 Gastrointestinal system: Nondistended, nontender, pos BS Central nervous system: No seizures, no tremors Extremities: No cyanosis, no joint deformities Skin: No rashes, no pallor Psychiatry: Affect normal // no auditory hallucinations   Data Reviewed:  Labs reviewed: Na 138,  K 4.2, Cr 1.13   Family Communication: Pt in room, family not at bedside  Disposition: Status is: Observation The patient will require care spanning > 2 midnights and should be moved to inpatient because: Severity of illness  Planned Discharge Destination: Home    Author: Marylu Lund, MD 07/29/2022 3:59 PM  For on call review [email or web address].

## 2022-07-29 NOTE — H&P (View-Only) (Signed)
Progress Note   Subjective  Day #2 Chief Complaint: GI bleed  Today, patient tells me she has not had another bowel movement since her regular 1 yesterday.  She is feeling well with no acute complaints or concerns.  Aware that we are planning for colonoscopy tomorrow since her INR is now normal.   Objective   Vital signs in last 24 hours: Temp:  [97.3 F (36.3 C)-98 F (36.7 C)] 98 F (36.7 C) (08/24 0652) Pulse Rate:  [71-112] 85 (08/24 0900) Resp:  [15-25] 18 (08/24 0900) BP: (114-163)/(72-108) 128/80 (08/24 0900) SpO2:  [84 %-99 %] 90 % (08/24 0900) Weight:  [93 kg] 93 kg (08/23 1347) Last BM Date : 07/26/22 General:    AA female in NAD Heart:  Regular rate and rhythm; no murmurs Lungs: Respirations even and unlabored, lungs CTA bilaterally Abdomen:  Soft, nontender and nondistended. Normal bowel sounds. Psych:  Cooperative. Normal mood and affect.  Intake/Output from previous day: 08/23 0701 - 08/24 0700 In: 254.3 [P.O.:120; I.V.:134.3] Out: 700 [Urine:700]   Lab Results: Recent Labs    07/27/22 0110 07/28/22 1023 07/29/22 0451  WBC 6.2 7.2 5.8  HGB 9.4* 11.1* 10.0*  HCT 29.4* 33.0* 29.8*  PLT 204 228 252   BMET Recent Labs    07/27/22 0110 07/28/22 1023 07/29/22 0451  NA 137 138 138  K 4.2 4.3 4.2  CL 109 108 107  CO2 '22 22 24  '$ GLUCOSE 107* 88 91  BUN '19 10 11  '$ CREATININE 1.62* 0.93 1.13*  CALCIUM 7.6* 8.5* 8.2*   LFT Recent Labs    07/29/22 0451  PROT 5.7*  ALBUMIN 2.8*  AST 20  ALT 16  ALKPHOS 65  BILITOT 0.3   PT/INR Recent Labs    07/28/22 1537 07/29/22 0451  LABPROT 15.8* 16.5*  INR 1.3* 1.3*    Studies/Results: ECHO TEE  Result Date: 07/28/2022    TRANSESOPHOGEAL ECHO REPORT   Patient Name:   [name] Date of Exam: 07/28/2022 Medical Rec #:  [id number]         Height:       71.0 in Accession #:    [id number]        Weight:       205.0 lb Date of Birth:  1961/09/16         BSA:          2.131 m Patient Age:    61  years          BP:           114/72 mmHg Patient Gender: F                 HR:           83 bpm. Exam Location:  Inpatient Procedure: Transesophageal Echo, 3D Echo, Color Doppler and Cardiac Doppler Indications:     elevated mitral valve gradients  History:         Patient has prior history of Echocardiogram examinations, most                  recent 07/27/2022. Risk Factors:Diabetes, Hypertension, GERD and                  Current Smoker.                   Mitral Valve: prosthetic annuloplasty ring valve is present in  the mitral position. Procedure Date: 04/19/22.  Sonographer:     Bernadene Person RDCS Referring Phys:  [id number] [facility] A Gasper Sells Diagnosing Phys: Sanda Klein MD PROCEDURE: After discussion of the risks and benefits of a TEE, an informed consent was obtained from the patient. The transesophogeal probe was passed without difficulty through the esophogus of the patient. Sedation performed by different physician. The patient was monitored while under deep sedation. Anesthestetic sedation was provided intravenously by Anesthesiology: 137.'65mg'$  of Propofol, '80mg'$  of Lidocaine. The patient's vital signs; including heart rate, blood pressure, and oxygen saturation; remained stable throughout the procedure. The patient developed no complications during the procedure. IMPRESSIONS  1. Left ventricular ejection fraction, by estimation, is 60 to 65%. The left ventricle has normal function. The left ventricle has no regional wall motion abnormalities. There is mild concentric left ventricular hypertrophy. Left ventricular diastolic function could not be evaluated.  2. Right ventricular systolic function is normal. The right ventricular size is normal. There is moderately elevated pulmonary artery systolic pressure. The estimated right ventricular systolic pressure is 14.7 mmHg.  3. Left atrial size was severely dilated. No left atrial/left atrial appendage thrombus was detected. The LAA emptying  velocity was 70 cm/s.  4. Right atrial size was mildly dilated.  5. The mitral valve has been repaired/replaced. Mild mitral valve regurgitation. Mild mitral stenosis. The mean mitral valve gradient is 6.6 mmHg with average heart rate of 83 bpm. 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 Procedure Date: 04/19/22.  6. The tricuspid valve is has been repaired/replaced. 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 Tricuspid valve regurgitation is moderate.  7. The aortic valve is tricuspid. Aortic valve regurgitation is not visualized. No aortic stenosis is present. Comparison(s): A "double envelope" or "ghosting" artifact is seen during CW interrogation of the mitral inflow. This may have led to overestimation of the transmitral gradient on the previous study. FINDINGS  Left Ventricle: Left ventricular ejection fraction, by estimation, is 60 to 65%. The left ventricle has normal function. The left ventricle has no regional wall motion abnormalities. The left ventricular internal cavity size was normal in size. There is  mild concentric left ventricular hypertrophy. Left ventricular diastolic function could not be evaluated due to mitral valve repair. Left ventricular diastolic function could not be evaluated. Right Ventricle: The right ventricular size is normal. No increase in right ventricular wall thickness. Right ventricular systolic function is normal. There is moderately elevated pulmonary artery systolic pressure. The tricuspid regurgitant velocity is 3.52 m/s, and with an assumed right atrial pressure of 5 mmHg, the estimated right ventricular systolic pressure is 82.9 mmHg. Left Atrium: Left atrial size was severely dilated. No left atrial/left atrial appendage thrombus was detected. The LAA emptying velocity was 70 cm/s. Right Atrium: Right atrial size was mildly dilated. Pericardium: There is no evidence of pericardial effusion. Mitral Valve: The mitral  valve has been repaired/replaced. Mild mitral valve regurgitation, with centrally-directed jet. 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 Procedure Date: 04/19/22. Mild mitral valve stenosis. The mean  mitral valve gradient is 6.6 mmHg with average heart rate of 83 bpm. Tricuspid Valve: The tricuspid valve is has been repaired/replaced. Tricuspid valve regurgitation is moderate. 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 Aortic Valve: The aortic valve is tricuspid. Aortic valve regurgitation is not visualized. No aortic stenosis is present. Pulmonic Valve: The pulmonic valve was normal in structure. Pulmonic valve regurgitation  is not visualized. Aorta: The aortic root, ascending aorta, aortic arch and descending aorta are all structurally normal, with no evidence of dilitation or obstruction. There is minimal (Grade I) plaque. IAS/Shunts: No atrial level shunt detected by color flow Doppler.  MITRAL VALVE             TRICUSPID VALVE MV Area (PHT): 2.48 cm  TV Area (PHT):  2.26 cm MV Mean grad:  6.6 mmHg  TV Mean grad:   1.3 mmHg MV Decel Time: 306 msec  TV PHT:         97 msec                          TR Peak grad:   49.6 mmHg                          TR Vmax:        352.00 cm/s Dani Gobble Croitoru MD Electronically signed by Sanda Klein MD Signature Date/Time: 07/28/2022/2:32:07 PM    Final    ECHOCARDIOGRAM LIMITED  Result Date: 07/27/2022    ECHOCARDIOGRAM LIMITED REPORT   Patient Name:   [name] Date of Exam: 07/27/2022 Medical Rec #:  [id number]         Height:       71.0 in Accession #:    [id number]        Weight:       210.2 lb Date of Birth:  22-Jun-1961         BSA:          2.154 m Patient Age:    19 years          BP:           155/82 mmHg Patient Gender: F                 HR:           87 bpm. Exam Location:  Inpatient Procedure: Limited Echo, Limited Color Doppler and Cardiac Doppler Indications:    mitral valve disorder  History:         Patient has prior history of Echocardiogram examinations, most                 recent 05/31/2022. CHF, Arrythmias:Bradycardia; Risk                 Factors:Dyslipidemia, Diabetes and Current Smoker.                  Mitral Valve: valve is present in the mitral position. Procedure                 Date: 04/19/22.  Sonographer:    Johny Chess RDCS Referring Phys: [id number] [place] A CHANDRASEKHAR IMPRESSIONS  1. Left ventricular ejection fraction, by estimation, is 50 to 55%. The left ventricle has low normal function.  2. Left atrial size was severely dilated.  3. Right atrial size was moderately dilated.  4. S/p MV prosthetic annuloplasty ring placed 04/19/2022. mod-severe mitral stenosis, MV mean gradient 15 mmHg, peak 27 mmhg at HR 87 bpm. Mild mitral valve regurgitation. There is a present in the mitral position. Procedure Date: 04/19/22.  5. 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 Tricuspid valve regurgitation is moderate.  6. Aortic valve regurgitation is not visualized.  7. There is severely elevated pulmonary artery systolic pressure. Conclusion(s)/Recommendation(s): Compared to prior study, MV  mean gradient has increased. May be related to rate. Consider TEE/cardiac CT if clinically indicated. FINDINGS  Left Ventricle: Left ventricular ejection fraction, by estimation, is 50 to 55%. The left ventricle has low normal function. Right Ventricle: There is severely elevated pulmonary artery systolic pressure. The tricuspid regurgitant velocity is 3.74 m/s, and with an assumed right atrial pressure of 5 mmHg, the estimated right ventricular systolic pressure is 09.3 mmHg. Left Atrium: Left atrial size was severely dilated. Right Atrium: Right atrial size was moderately dilated. Mitral Valve: S/p MV prosthetic annuloplasty ring placed 04/19/2022. mod-severe mitral stenosis, MV mean gradient 15 mmHg, peak 27 mmhg at HR 87 bpm. Mild mitral valve regurgitation. There is a present in the  mitral position. Procedure Date: 04/19/22. MV peak gradient, 26.8 mmHg. The mean mitral valve gradient is 15.0 mmHg. Tricuspid Valve: Tricuspid valve regurgitation is moderate. 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 Aortic Valve: Aortic valve regurgitation is not visualized. Pulmonic Valve: Pulmonic valve regurgitation is not visualized. LEFT VENTRICLE PLAX 2D LVOT diam:     2.30 cm LV SV:         88 LV SV Index:   41 LVOT Area:     4.15 cm  IVC IVC diam: 2.20 cm AORTIC VALVE             PULMONIC VALVE LVOT Vmax:   114.50 cm/s RVOT Peak grad: 1 mmHg LVOT Vmean:  75.250 cm/s LVOT VTI:    0.213 m MITRAL VALVE              TRICUSPID VALVE MV Area VTI:  1.53 cm    TV Peak grad:   6.5 mmHg MV Peak grad: 26.8 mmHg   TV Mean grad:   4.0 mmHg MV Mean grad: 15.0 mmHg   TV Vmax:        1.27 m/s MV Vmax:      2.59 m/s    TV Vmean:       93.6 cm/s MV Vmean:     183.0 cm/s  TV VTI:         0.29 msec MR Peak grad: 149.3 mmHg  TR Peak grad:   56.0 mmHg MR Mean grad: 97.0 mmHg   TR Vmax:        374.00 cm/s MR Vmax:      611.00 cm/s MR Vmean:     469.0 cm/s  SHUNTS                           Systemic VTI:  0.21 m                           Systemic Diam: 2.30 cm                           Pulmonic VTI:  0.107 m Phineas Inches Electronically signed by Phineas Inches Signature Date/Time: 07/27/2022/12:28:20 PM    Final       Assessment / Plan:   Assessment: 1.  Acute on chronic anemia with hematochezia: Baseline hemoglobin around 10, 7.5 at admission--> 1 unit PRBCs--> 8.5--> 9.4--> 11.1--> 10.0, 1 episode of hematochezia at admission, none since, overdue for colonoscopy; consider hemorrhoids versus other 2.  A-fib: Typically on Coumadin, on hold since 07/26/2022, INR now 1.3 3.  Chronic systolic heart failure: Echo 05/31/2022 with EF 50 to 55% 4.  Chronic  pain syndrome 5.  AKI on stage III CKD 6.  Dizziness  Plan: 1.  We will schedule patient for colonoscopy tomorrow given that she is overdue for  surveillance and had an episode of hematochezia here in the hospital.  No plans for small bowel enteroscopy given the patient has had no further bleeding and her hemoglobin is stable.  Did go ahead and discussed risks, benefits, limitations and alternatives patient she agrees to proceed. 2.  Patient be a clear liquid diet today and n.p.o. at midnight 3.  Patient will start bowel prep in split dose fashion later this afternoon 4.  Continue other supportive measures 5.  Continue to hold Coumadin until recommendations after colonoscopy tomorrow  Thank you for your kind consultation, we will continue to follow.   LOS: 0 days   Levin Erp  07/29/2022, 11:22 AM    Attending Physician Note   I have taken an interval history, reviewed the chart and examined the patient. I performed a substantive portion of this encounter, including complete performance of at least one of the key components, in conjunction with the APP. I agree with the APP's note, impression and recommendations with my edits. My additional impressions and recommendations are as follows.   Acute on chronic anemia and hematochezia. INR=1.3 today and yesterday. Colonoscopy is scheduled on Friday. Continue to hold Coumadin.    Lucio Edward, MD [facility] See [facility], [facility], for our on call provider

## 2022-07-30 ENCOUNTER — Inpatient Hospital Stay (HOSPITAL_COMMUNITY): Payer: Medicare Other | Admitting: Anesthesiology

## 2022-07-30 ENCOUNTER — Encounter (HOSPITAL_COMMUNITY)
Admission: EM | Disposition: A | Discharge status: Home / Self Care | Payer: Self-pay | Source: Home / Self Care | Attending: Internal Medicine

## 2022-07-30 ENCOUNTER — Encounter (HOSPITAL_COMMUNITY): Payer: Self-pay | Admitting: Internal Medicine

## 2022-07-30 DIAGNOSIS — F17210 Nicotine dependence, cigarettes, uncomplicated: Secondary | ICD-10-CM

## 2022-07-30 DIAGNOSIS — D120 Benign neoplasm of cecum: Secondary | ICD-10-CM

## 2022-07-30 DIAGNOSIS — D122 Benign neoplasm of ascending colon: Secondary | ICD-10-CM | POA: Diagnosis not present

## 2022-07-30 DIAGNOSIS — Z86010 Personal history of colonic polyps: Secondary | ICD-10-CM

## 2022-07-30 DIAGNOSIS — I5032 Chronic diastolic (congestive) heart failure: Secondary | ICD-10-CM

## 2022-07-30 DIAGNOSIS — R42 Dizziness and giddiness: Secondary | ICD-10-CM | POA: Diagnosis not present

## 2022-07-30 DIAGNOSIS — I110 Hypertensive heart disease with heart failure: Secondary | ICD-10-CM

## 2022-07-30 DIAGNOSIS — K640 First degree hemorrhoids: Secondary | ICD-10-CM

## 2022-07-30 DIAGNOSIS — D123 Benign neoplasm of transverse colon: Secondary | ICD-10-CM

## 2022-07-30 DIAGNOSIS — K5520 Angiodysplasia of colon without hemorrhage: Secondary | ICD-10-CM

## 2022-07-30 DIAGNOSIS — K635 Polyp of colon: Secondary | ICD-10-CM

## 2022-07-30 HISTORY — PX: HOT HEMOSTASIS: SHX5433

## 2022-07-30 HISTORY — PX: POLYPECTOMY: SHX5525

## 2022-07-30 HISTORY — PX: COLONOSCOPY WITH PROPOFOL: SHX5780

## 2022-07-30 LAB — COMPREHENSIVE METABOLIC PANEL
ALT: 16 U/L (ref 0–44)
AST: 34 U/L (ref 15–41)
Albumin: 3.2 g/dL — ABNORMAL LOW (ref 3.5–5.0)
Alkaline Phosphatase: 71 U/L (ref 38–126)
Anion gap: 8 (ref 5–15)
BUN: 11 mg/dL (ref 8–23)
CO2: 21 mmol/L — ABNORMAL LOW (ref 22–32)
Calcium: 8.6 mg/dL — ABNORMAL LOW (ref 8.9–10.3)
Chloride: 110 mmol/L (ref 98–111)
Creatinine, Ser: 1.12 mg/dL — ABNORMAL HIGH (ref 0.44–1.00)
GFR, Estimated: 56 mL/min — ABNORMAL LOW (ref >60)
Glucose, Bld: 94 mg/dL (ref 70–99)
Potassium: 4.2 mmol/L (ref 3.5–5.1)
Sodium: 139 mmol/L (ref 135–145)
Total Bilirubin: 0.8 mg/dL (ref 0.3–1.2)
Total Protein: 6.5 g/dL (ref 6.5–8.1)

## 2022-07-30 LAB — PROTIME-INR
INR: 1.2 (ref 0.8–1.2)
Prothrombin Time: 15.4 seconds — ABNORMAL HIGH (ref 11.4–15.2)

## 2022-07-30 LAB — CBC
HCT: 33.2 % — ABNORMAL LOW (ref 36.0–46.0)
Hemoglobin: 11.1 g/dL — ABNORMAL LOW (ref 12.0–15.0)
MCH: 28.9 pg (ref 26.0–34.0)
MCHC: 33.4 g/dL (ref 30.0–36.0)
MCV: 86.5 fL (ref 80.0–100.0)
Platelets: 274 10*3/uL (ref 150–400)
RBC: 3.84 MIL/uL — ABNORMAL LOW (ref 3.87–5.11)
RDW: 16.3 % — ABNORMAL HIGH (ref 11.5–15.5)
WBC: 7.8 10*3/uL (ref 4.0–10.5)
nRBC: 0 % (ref 0.0–0.2)

## 2022-07-30 LAB — GLUCOSE, CAPILLARY
Glucose-Capillary: 95 mg/dL (ref 70–99)
Glucose-Capillary: 79 mg/dL (ref 70–99)
Glucose-Capillary: 83 mg/dL (ref 70–99)
Glucose-Capillary: 81 mg/dL (ref 70–99)
Glucose-Capillary: 77 mg/dL (ref 70–99)

## 2022-07-30 SURGERY — COLONOSCOPY WITH PROPOFOL
Anesthesia: Monitor Anesthesia Care

## 2022-07-30 MED ORDER — PROPOFOL 10 MG/ML IV BOLUS
INTRAVENOUS | Status: DC | PRN
  Administered 2022-07-30 (×4): 10 mg via INTRAVENOUS

## 2022-07-30 MED ORDER — LIDOCAINE 2% (20 MG/ML) 5 ML SYRINGE
INTRAMUSCULAR | Status: DC | PRN
  Administered 2022-07-30: 60 mg via INTRAVENOUS

## 2022-07-30 MED ORDER — PROPOFOL 500 MG/50ML IV EMUL
INTRAVENOUS | Status: DC | PRN
  Administered 2022-07-30: 125 ug/kg/min via INTRAVENOUS

## 2022-07-30 MED ORDER — LACTATED RINGERS IV SOLN
INTRAVENOUS | Status: AC | PRN
  Administered 2022-07-30: 20 mL/h via INTRAVENOUS

## 2022-07-30 MED ORDER — ONDANSETRON HCL 4 MG/2ML IJ SOLN
INTRAMUSCULAR | Status: AC
  Filled 2022-07-30: qty 2

## 2022-07-30 SURGICAL SUPPLY — 22 items

## 2022-07-30 NOTE — Transfer of Care (Signed)
Immediate Anesthesia Transfer of Care Note  Patient: [name]  Procedure(s) Performed: COLONOSCOPY WITH PROPOFOL HOT HEMOSTASIS (ARGON PLASMA COAGULATION/BICAP) POLYPECTOMY  Patient Location: PACU  Anesthesia Type:MAC  Level of Consciousness: awake, alert  and oriented  Airway & Oxygen Therapy: Patient Spontanous Breathing and Patient connected to nasal cannula oxygen  Post-op Assessment: Report given to RN and Post -op Vital signs reviewed and stable  Post vital signs: Reviewed and stable  Last Vitals:  Vitals Value Taken Time  BP 147/98 07/30/22 1245  Temp    Pulse 91 07/30/22 1246  Resp 30 07/30/22 1246  SpO2 93 % 07/30/22 1246  Vitals shown include unvalidated device data.  Last Pain:  Vitals:   07/30/22 1052  TempSrc: Temporal  PainSc: 8       Patients Stated Pain Goal: 2 (88/50/27 7412)  Complications: No notable events documented.

## 2022-07-30 NOTE — Anesthesia Preprocedure Evaluation (Addendum)
Anesthesia Evaluation  Patient identified by MRN, date of birth, ID band Patient awake    Reviewed: Allergy & Precautions, NPO status , Patient's Chart, lab work & pertinent test results  History of Anesthesia Complications Negative for: history of anesthetic complications  Airway Mallampati: II  TM Distance: >3 FB Neck ROM: Full    Dental  (+) Missing,    Pulmonary Current Smoker and Patient abstained from smoking.,    Pulmonary exam normal        Cardiovascular hypertension (146/106), Pt. on medications pulmonary hypertension (mod pHTN)+CHF and + DOE  Normal cardiovascular exam+ dysrhythmias (coumadin ) Atrial Fibrillation + Valvular Problems/Murmurs (s/p MVR 04/2022, residual mild MS, mild MR) MR   Echo 07/28/22 1. Left ventricular ejection fraction, by estimation, is 60 to 65%. The  left ventricle has normal function. The left ventricle has no regional  wall motion abnormalities. There is mild concentric left ventricular  hypertrophy. Left ventricular diastolic  function could not be evaluated.  2. Right ventricular systolic function is normal. The right ventricular  size is normal. There is moderately elevated pulmonary artery systolic  pressure. The estimated right ventricular systolic pressure is 03.0 mmHg.  3. Left atrial size was severely dilated. No left atrial/left atrial  appendage thrombus was detected. The LAA emptying velocity was 70 cm/s.  4. Right atrial size was mildly dilated.  5. The mitral valve has been repaired/replaced. Mild mitral valve  regurgitation. Mild mitral stenosis. The mean mitral valve gradient is 6.6  mmHg with average heart rate of 83 bpm. There is a prosthetic annuloplasty  ring present in the mitral position.  Procedure Date: 04/19/22.  6. The tricuspid valve is has been repaired/replaced. The tricuspid valve  is status post repair with an annuloplasty ring. Tricuspid valve   regurgitation is moderate.  7. The aortic valve is tricuspid. Aortic valve regurgitation is not  visualized. No aortic stenosis is present.    Neuro/Psych PSYCHIATRIC DISORDERS Anxiety Depression negative neurological ROS     GI/Hepatic GERD  Medicated and Controlled,(+)     substance abuse (hx substance abuse, sober since 2001)  , Hepatitis -, CHematochezia    Endo/Other  diabetes, Well Controlled, Type 2, Oral Hypoglycemic Agentsa1c 6  Renal/GU Renal InsufficiencyRenal diseaseCr 1.13     Musculoskeletal  (+) Arthritis , Osteoarthritis,    Abdominal   Peds  Hematology negative hematology ROS (+)   Anesthesia Other Findings   Reproductive/Obstetrics negative OB ROS                            Anesthesia Physical Anesthesia Plan  ASA: 3  Anesthesia Plan: MAC   Post-op Pain Management:    Induction:   PONV Risk Score and Plan: 2 and Propofol infusion and TIVA  Airway Management Planned: Natural Airway and Simple Face Mask  Additional Equipment: None  Intra-op Plan:   Post-operative Plan:   Informed Consent: I have reviewed the patients History and Physical, chart, labs and discussed the procedure including the risks, benefits and alternatives for the proposed anesthesia with the patient or authorized representative who has indicated his/her understanding and acceptance.       Plan Discussed with: CRNA  Anesthesia Plan Comments:        Anesthesia Quick Evaluation

## 2022-07-30 NOTE — Progress Notes (Signed)
Mobility Specialist - Progress Note   07/30/22 1500  Mobility  Activity Ambulated with assistance in hallway  Level of Assistance Standby assist, set-up cues, supervision of patient - no hands on  Assistive Device Front wheel walker  Distance Ambulated (ft) 250 ft  Activity Response Tolerated well  $Mobility charge 1 Mobility   Pt was received in bed and agreeable to mobility. Pt c/o general pain throughout ambulation requiring x1 standing break. Pt was returned back to bed with all needs met.  Larey Seat

## 2022-07-30 NOTE — Progress Notes (Signed)
Progress Note   Patient: [name] HMC:[id number] DOB: June 10, 1961 DOA: 07/26/2022     1 DOS: the patient was seen and examined on 07/30/2022   Brief hospital course: 61 yo female with PMH atrial fibrillation/flutter on Coumadin, valvular heart disease status post mitral and tricuspid valve repair (annuloplasty ring) on 04/19/2022 with pericardial effusion s/p pericardiocentesis 05/10/22, DMII, GERD, HTN, chronic pain syndrome discharged from the hospital on 07/24/2022, readmitted for persistent dizziness and syncope.  Patient also reports that she had bright red blood per rectum with bowel movement once since discharge.  On arrival to ED she was hypotensive, bradycardic which have resolved with IV fluids.  Assessment and Plan: Dizziness and lightheadedness, probable syncopal episode initially suspected secondary to overdose of oxycodone versus bradycardia and hypotension. -Patient was on metoprolol which was discontinued this admission.   -Diltiazem was discontinued prior to discharge. -cardiology following. Pt s/p TEE 8/23 with findings of mod to severe TR, recs to avoid anemia/tachycardia. Recs to cont metoprolol  Per GI, recs to resume coumadin in 3 days at prior dose, and to avoid NSAID x 2 weeks    Acute kidney injury on stage IIIa CKD -Secondary to prerenal azotemia from hypotension holding Cozaar for now.   -Her blood pressure improved with fluid boluses  -Cr currently 1.12    Acute anemia of blood loss Possibly lower GI bleed. Her baseline hemoglobin around 10 and she was admitted with a hemoglobin of 7.5, s/p 1 unit of PRBC transfusion.   Hgb stable at 11.1 GI consulted with Coumadin on hold. Cardiology recs to resume coumadin when clear by GI    Atrial fibrillation Rate controlled Coumadin on hold for concern for rectal bleeding. Rate controlling drugs have been held due to bradycardia and dizziness and syncope. Per GI, would resume coumadin at previous dose in 3 days     Chronic pain syndrome Patient had been on oxycodone 7.5 mg every 8 hours but she reports that he has been taking 15 mg every 8 hours in addition to methocarbamol.  She is at risk for opioid misuse and overdose and suspect her symptoms could partly be due to taking oxycodone 15 mg every 8 hours. Requesting increased pain meds NCCSR reviewed. Pt had been prescribed 120 tabs/30 days, which corresponds to 4 tabs/day Now on oxycodone to q6h PRN    Type 2 diabetes mellitus Well-controlled CBGs and, recent a1c of 6.0 Continue with sliding scale insulin and gabapentin    COPD -Patient reports that she is smoking and continues to smoke.   -No audible wheezing on exam -Nicotine patch order. -On minimal O2 support    Mental health disorder:  -Adjusted medications due to risk of serotonin syndrome.     Hyperlipidemia:  -Continue with lipitor.       Subjective: In good spirits this AM. Without complaints  Physical Exam: Vitals:   07/30/22 1246 07/30/22 1300 07/30/22 1315 07/30/22 1344  BP:  (!) 149/101 (!) 142/107 (!) 152/110  Pulse:  92 93 95  Resp: '16 14 20 20  '$ Temp:   98.7 F (37.1 C) 97.8 F (36.6 C)  TempSrc:    Oral  SpO2:  94% 96% 94%  Weight:      Height:       General exam: Awake, laying in bed, in nad Respiratory system: Normal respiratory effort, no wheezing Cardiovascular system: regular rate, s1, s2 Gastrointestinal system: Soft, nondistended, positive BS Central nervous system: CN2-12 grossly intact, strength intact Extremities: Perfused, no clubbing Skin: Normal  skin turgor, no notable skin lesions seen Psychiatry: Mood normal // no visual hallucinations   Data Reviewed:  Labs reviewed: Na 139, K 4.2, Cr 1.12   Family Communication: Pt in room, family not at bedside  Disposition: Status is: Observation The patient will require care spanning > 2 midnights and should be moved to inpatient because: Severity of illness  Planned Discharge Destination:  Home    Author: Marylu Lund, MD 07/30/2022 6:42 PM  For on call review [email or web address].

## 2022-07-30 NOTE — Anesthesia Postprocedure Evaluation (Signed)
Anesthesia Post Note  Patient: Dalal Livengood  Procedure(s) Performed: COLONOSCOPY WITH PROPOFOL HOT HEMOSTASIS (ARGON PLASMA COAGULATION/BICAP) POLYPECTOMY     Patient location during evaluation: PACU Anesthesia Type: MAC Level of consciousness: awake and alert Pain management: pain level controlled Vital Signs Assessment: post-procedure vital signs reviewed and stable Respiratory status: spontaneous breathing, nonlabored ventilation and respiratory function stable Cardiovascular status: blood pressure returned to baseline and stable Postop Assessment: no apparent nausea or vomiting Anesthetic complications: no   No notable events documented.  Last Vitals:  Vitals:   07/30/22 0720 07/30/22 1052  BP: (!) 157/110 (!) 157/109  Pulse: 95 98  Resp: 18 14  Temp: 36.6 C 36.5 C  SpO2: 95% 95%    Last Pain:  Vitals:   07/30/22 1052  TempSrc: Temporal  PainSc: [place]

## 2022-07-30 NOTE — Interval H&P Note (Signed)
History and Physical Interval Note:  07/30/2022 11:56 AM  [name]  has presented today for surgery, with the diagnosis of hematochezia.  The various methods of treatment have been discussed with the patient and family. After consideration of risks, benefits and other options for treatment, the patient has consented to  Procedure(s): COLONOSCOPY WITH PROPOFOL (N/A) as a surgical intervention.  The patient's history has been reviewed, patient examined, no change in status, stable for surgery.  I have reviewed the patient's chart and labs.  Questions were answered to the patient's satisfaction.     Pricilla Riffle. [place] Plan

## 2022-07-30 NOTE — Progress Notes (Signed)
Progress Note  Patient Name: [name] Date of Encounter: 07/30/2022  Primary Cardiologist: Werner Lean, MD   Subjective   Notes back pain. No SOB NPO for procedure  Inpatient Medications    Scheduled Meds:  ARIPiprazole  5 mg Oral Daily   atorvastatin  20 mg Oral Daily   buPROPion  300 mg Oral q AM   escitalopram  10 mg Oral q morning   furosemide  20 mg Oral Daily   gabapentin  300 mg Oral TID   insulin aspart  0-5 Units Subcutaneous QHS   insulin aspart  0-6 Units Subcutaneous TID WC   losartan  100 mg Oral Daily   metoprolol tartrate  50 mg Oral BID   nicotine  21 mg Transdermal Daily   pantoprazole  40 mg Oral Daily   Continuous Infusions:  sodium chloride 20 mL/hr at 07/30/22 0612    PRN Meds: acetaminophen, albuterol, hydrOXYzine, methocarbamol, ondansetron (ZOFRAN) IV, oxyCODONE, polyvinyl alcohol, traZODone   Vital Signs    Vitals:   07/29/22 1216 07/29/22 1945 07/30/22 0010 07/30/22 0508  BP: (!) 157/108 (!) 147/97 (!) 151/110 (!) 146/104  Pulse: 90 89 91 100  Resp:    18  Temp:  97.8 F (36.6 C) (!) 97.5 F (36.4 C) 99 F (37.2 C)  TempSrc:  Oral Oral Oral  SpO2: 95% 95% 96% 94%  Weight:    90.4 kg  Height:        Intake/Output Summary (Last 24 hours) at 07/30/2022 0755 Last data filed at 07/29/2022 1700 Gross per 24 hour  Intake 220 ml  Output 1200 ml  Net -980 ml   Filed Weights   07/28/22 0417 07/28/22 1347 07/30/22 0508  Weight: 93 kg 93 kg 90.4 kg    Telemetry    SR- no AF - Personally Reviewed  Physical Exam   General:  Well nourished, well developed, in no acute distress HEENT: normal Neck: no JVD Cardiac:  normal S1, S2; RRR; systolic and diastolic murmurs Lungs:  clear to auscultation bilaterally, no wheezing, rhonchi or rales  Abd: soft, nontender, no hepatomegaly  Ext: no edema Musculoskeletal:  No deformities, BUE and BLE strength normal and equal Skin: warm and dry  Neuro:  CNs 2-12 intact,  no focal abnormalities noted Psych:  Normal affect pleasant mood   Labs    Chemistry Recent Labs  Lab 07/28/22 1023 07/29/22 0451 07/30/22 0552  NA 138 138 139  K 4.3 4.2 4.2  CL 108 107 110  CO2 22 24 21*  GLUCOSE 88 91 94  BUN '10 11 11  '$ CREATININE 0.93 1.13* 1.12*  CALCIUM 8.5* 8.2* 8.6*  PROT 6.4* 5.7* 6.5  ALBUMIN 3.1* 2.8* 3.2*  AST 29 20 34  ALT '18 16 16  '$ ALKPHOS 76 65 71  BILITOT 0.7 0.3 0.8  GFRNONAA >60 55* 56*  ANIONGAP '8 7 8     '$ Hematology Recent Labs  Lab 07/28/22 1023 07/29/22 0451 07/30/22 0552  WBC 7.2 5.8 7.8  RBC 3.91 3.49* 3.84*  HGB 11.1* 10.0* 11.1*  HCT 33.0* 29.8* 33.2*  MCV 84.4 85.4 86.5  MCH 28.4 28.7 28.9  MCHC 33.6 33.6 33.4  RDW 16.1* 16.4* 16.3*  PLT 228 252 274    Cardiac EnzymesNo results for input(s): "TROPONINI" in the last 168 hours. No results for input(s): "TROPIPOC" in the last 168 hours.   BNP Recent Labs  Lab 07/26/22 1115  BNP 699.1*     DDimer No results  for input(s): "DDIMER" in the last 168 hours.   Radiology    ECHO TEE  Result Date: 07/28/2022    TRANSESOPHOGEAL ECHO REPORT   Patient Name:   [name] Date of Exam: 07/28/2022 Medical Rec #:  [id number]         Height:       71.0 in Accession #:    [id number]        Weight:       205.0 lb Date of Birth:  Mar 18, 1961         BSA:          2.131 m Patient Age:    61 years          BP:           114/72 mmHg Patient Gender: F                 HR:           83 bpm. Exam Location:  Inpatient Procedure: Transesophageal Echo, 3D Echo, Color Doppler and Cardiac Doppler Indications:     elevated mitral valve gradients  History:         Patient has prior history of Echocardiogram examinations, most                  recent 07/27/2022. Risk Factors:Diabetes, Hypertension, GERD and                  Current Smoker.                   Mitral Valve: prosthetic annuloplasty ring valve is present in                  the mitral position. Procedure Date: 04/19/22.  Sonographer:      Bernadene Person RDCS Referring Phys:  [id number] [facility] A Gasper Sells Diagnosing Phys: Sanda Klein MD PROCEDURE: After discussion of the risks and benefits of a TEE, an informed consent was obtained from the patient. The transesophogeal probe was passed without difficulty through the esophogus of the patient. Sedation performed by different physician. The patient was monitored while under deep sedation. Anesthestetic sedation was provided intravenously by Anesthesiology: 137.'65mg'$  of Propofol, '80mg'$  of Lidocaine. The patient's vital signs; including heart rate, blood pressure, and oxygen saturation; remained stable throughout the procedure. The patient developed no complications during the procedure. IMPRESSIONS  1. Left ventricular ejection fraction, by estimation, is 60 to 65%. The left ventricle has normal function. The left ventricle has no regional wall motion abnormalities. There is mild concentric left ventricular hypertrophy. Left ventricular diastolic function could not be evaluated.  2. Right ventricular systolic function is normal. The right ventricular size is normal. There is moderately elevated pulmonary artery systolic pressure. The estimated right ventricular systolic pressure is 73.7 mmHg.  3. Left atrial size was severely dilated. No left atrial/left atrial appendage thrombus was detected. The LAA emptying velocity was 70 cm/s.  4. Right atrial size was mildly dilated.  5. The mitral valve has been repaired/replaced. Mild mitral valve regurgitation. Mild mitral stenosis. The mean mitral valve gradient is 6.6 mmHg with average heart rate of 83 bpm. 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 Procedure Date: 04/19/22.  6. The tricuspid valve is has been repaired/replaced. 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 Tricuspid valve regurgitation is moderate.  7. The aortic valve is tricuspid. Aortic valve regurgitation is not visualized. No aortic  stenosis is present.  Comparison(s): A "double envelope" or "ghosting" artifact is seen during CW interrogation of the mitral inflow. This may have led to overestimation of the transmitral gradient on the previous study. FINDINGS  Left Ventricle: Left ventricular ejection fraction, by estimation, is 60 to 65%. The left ventricle has normal function. The left ventricle has no regional wall motion abnormalities. The left ventricular internal cavity size was normal in size. There is  mild concentric left ventricular hypertrophy. Left ventricular diastolic function could not be evaluated due to mitral valve repair. Left ventricular diastolic function could not be evaluated. Right Ventricle: The right ventricular size is normal. No increase in right ventricular wall thickness. Right ventricular systolic function is normal. There is moderately elevated pulmonary artery systolic pressure. The tricuspid regurgitant velocity is 3.52 m/s, and with an assumed right atrial pressure of 5 mmHg, the estimated right ventricular systolic pressure is 35.3 mmHg. Left Atrium: Left atrial size was severely dilated. No left atrial/left atrial appendage thrombus was detected. The LAA emptying velocity was 70 cm/s. Right Atrium: Right atrial size was mildly dilated. Pericardium: There is no evidence of pericardial effusion. Mitral Valve: The mitral valve has been repaired/replaced. Mild mitral valve regurgitation, with centrally-directed jet. 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 Procedure Date: 04/19/22. Mild mitral valve stenosis. The mean  mitral valve gradient is 6.6 mmHg with average heart rate of 83 bpm. Tricuspid Valve: The tricuspid valve is has been repaired/replaced. Tricuspid valve regurgitation is moderate. 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 Aortic Valve: The aortic valve is tricuspid. Aortic valve regurgitation is not visualized. No aortic stenosis is present.  Pulmonic Valve: The pulmonic valve was normal in structure. Pulmonic valve regurgitation is not visualized. Aorta: The aortic root, ascending aorta, aortic arch and descending aorta are all structurally normal, with no evidence of dilitation or obstruction. There is minimal (Grade I) plaque. IAS/Shunts: No atrial level shunt detected by color flow Doppler.  MITRAL VALVE             TRICUSPID VALVE MV Area (PHT): 2.48 cm  TV Area (PHT):  2.26 cm MV Mean grad:  6.6 mmHg  TV Mean grad:   1.3 mmHg MV Decel Time: 306 msec  TV PHT:         97 msec                          TR Peak grad:   49.6 mmHg                          TR Vmax:        352.00 cm/s Mihai Croitoru MD Electronically signed by Sanda Klein MD Signature Date/Time: 07/28/2022/2:32:07 PM    Final      Patient Profile     61 y.o. female presenting with complete heart block complicated by hypotension and acute kidney injury, background history of recent mitral and tricuspid valve repair, paroxysmal atrial fibrillation and atrial flutter on warfarin anticoagulation  Assessment & Plan     Syncope - resolved, I do not suspect polypharmacy related syncope; she had hypotension and required blood transfusion.  This has resolved - continue metoprolol 50 mg PO BID; at Discharge would send heart monitor; if live monitor is feasible will do this but if not, would send non live ziopatch for 2 weeks (live monitors are not available on weekends)  PAF Rheumatic Heart  disease s/p MVR and TVR with no mechanical heart valves but with residual at least moderate TR - CHASDVASC NA - on coumadin, planned for colonoscopy today - restart AC when able - AKI has resolved, given TV continuing lasix  HTN - rebound off all medications for hypotension, syncope and anemia - restarted home medications   For questions or updates, please contact [facility] Please consult [email or web address] for contact info under Cardiology/STEMI.      Rudean Haskell, MD [address] [place], [place] [address] [phone]  7:55 AM

## 2022-07-30 NOTE — Op Note (Signed)
[facility] Patient Name: [name] Procedure Date : 07/30/2022 MRN: [id number] Attending MD: Ladene Artist , MD Date of Birth: [birth year] CSN: [id number] Age: 61 Admit Type: Inpatient Procedure:                Colonoscopy Indications:              Hematochezia, Personal history of adenomatous colon                            polyps Providers:                Pricilla Riffle. Fuller Plan, MD, Grace Isaac, RN, Cherylynn Ridges, Technician, Ward, Technician,                            Virgilio Belling. Huel Cote, CRNA Referring MD:             [facility] Medicines:                Monitored Anesthesia Care Complications:            No immediate complications. Estimated blood loss:                            None. Estimated Blood Loss:     Estimated blood loss: none. Procedure:                Pre-Anesthesia Assessment:                           - Prior to the procedure, a History and Physical                            was performed, and patient medications and                            allergies were reviewed. The patient's tolerance of                            previous anesthesia was also reviewed. The risks                            and benefits of the procedure and the sedation                            options and risks were discussed with the patient.                            All questions were answered, and informed consent                            was obtained. Prior Anticoagulants: The patient has                            taken Coumadin (warfarin), last dose was  4 days                            prior to procedure. ASA Grade Assessment: II - A                            patient with mild systemic disease. After reviewing                            the risks and benefits, the patient was deemed in                            satisfactory condition to undergo the procedure.                           After obtaining informed consent, the  colonoscope                            was passed under direct vision. Throughout the                            procedure, the patient's blood pressure, pulse, and                            oxygen saturations were monitored continuously. The                            CF-HQ190L ([id number]) Olympus coloscope was                            introduced through the anus and advanced to the the                            cecum, identified by appendiceal orifice and                            ileocecal valve. The ileocecal valve, appendiceal                            orifice, and rectum were photographed. The quality                            of the bowel preparation was adequate after                            extensive lavage, suction. The colonoscopy was                            performed without difficulty. The patient tolerated                            the procedure well. Scope In: 12:13:42 PM Scope Out: 12:38:07 PM Total Procedure Duration: 0 hours 24 minutes 25 seconds  Findings:      The perianal and  digital rectal examinations were normal.      A 20 mm polyp was found in the cecum. The polyp was sessile. The polyp       was removed with a piecemeal technique using a hot snare. Resection and       retrieval were complete.      Two small localized angioectasias without bleeding were found in the       cecum. Coagulation for bleeding prevention using argon plasma was       successful.      A 3 mm polyp was found in the ascending colon. The polyp was sessile.       The polyp was removed with a cold biopsy forceps. Resection and       retrieval were complete.      A 7 mm polyp was found in the transverse colon. The polyp was sessile.       The polyp was removed with a cold snare. Resection and retrieval were       complete.      Internal hemorrhoids were found during retroflexion. The hemorrhoids       were small and Grade I (internal hemorrhoids that do not prolapse).      The  exam was otherwise without abnormality on direct and retroflexion       views. Impression:               - One 20 mm polyp in the cecum, removed piecemeal                            using a hot snare. Resected and retrieved.                           - Two non-bleeding colonic angioectasias. Treated                            with argon plasma coagulation (APC).                           - One 3 mm polyp in the ascending colon, removed                            with a cold biopsy forceps. Resected and retrieved.                           - One 7 mm polyp in the transverse colon, removed                            with a cold snare. Resected and retrieved.                           - Internal hemorrhoids.                           - The examination was otherwise normal on direct                            and retroflexion views. Recommendation:           -  Repeat colonoscopy date to be determined, likely                            3 years, after pending pathology results are                            reviewed for surveillance based on pathology                            results with a more extensive bowel prep.                           - Resume Coumadin (warfarin) in 3 days at prior                            dose. Refer to managing physician for further                            adjustment of therapy.                           - Patient has a contact number available for                            emergencies. The signs and symptoms of potential                            delayed complications were discussed with the                            patient.                           - Return to hospital ward for ongoing care.                           - Resume previous diet.                           - Continue present medications.                           - Await pathology results.                           - No aspirin, ibuprofen, naproxen, or other                             non-steroidal anti-inflammatory drugs for 2 weeks                            after polyp removal.                           - GI signing off. Post hospital GI follow up is not  necessary. [facility] or [facility] GI prn. Procedure Code(s):        --- Professional ---                           [id number], 94, Colonoscopy, flexible; with control of                            bleeding, any method                           45385, Colonoscopy, flexible; with removal of                            tumor(s), polyp(s), or other lesion(s) by snare                            technique                           45380, 63, Colonoscopy, flexible; with biopsy,                            single or multiple Diagnosis Code(s):        --- Professional ---                           K63.5, Polyp of colon                           K55.20, Angiodysplasia of colon without hemorrhage                           K64.0, First degree hemorrhoids                           K92.1, Melena (includes Hematochezia) CPT copyright 2019 [employer]. All rights reserved. The codes documented in this report are preliminary and upon coder review may  be revised to meet current compliance requirements. Ladene Artist, MD 07/30/2022 12:52:22 PM This report has been signed electronically. Number of Addenda: 0

## 2022-07-31 DIAGNOSIS — D123 Benign neoplasm of transverse colon: Secondary | ICD-10-CM

## 2022-07-31 DIAGNOSIS — R42 Dizziness and giddiness: Secondary | ICD-10-CM | POA: Diagnosis not present

## 2022-07-31 DIAGNOSIS — I5022 Chronic systolic (congestive) heart failure: Secondary | ICD-10-CM | POA: Diagnosis not present

## 2022-07-31 DIAGNOSIS — D122 Benign neoplasm of ascending colon: Secondary | ICD-10-CM | POA: Diagnosis not present

## 2022-07-31 LAB — PROTIME-INR
INR: 1.2 (ref 0.8–1.2)
Prothrombin Time: 14.7 seconds (ref 11.4–15.2)

## 2022-07-31 LAB — GLUCOSE, CAPILLARY
Glucose-Capillary: 119 mg/dL — ABNORMAL HIGH (ref 70–99)
Glucose-Capillary: 95 mg/dL (ref 70–99)

## 2022-07-31 LAB — COMPREHENSIVE METABOLIC PANEL
ALT: 18 U/L (ref 0–44)
AST: 43 U/L — ABNORMAL HIGH (ref 15–41)
Albumin: 3.6 g/dL (ref 3.5–5.0)
Alkaline Phosphatase: 75 U/L (ref 38–126)
Anion gap: 10 (ref 5–15)
BUN: 12 mg/dL (ref 8–23)
CO2: 19 mmol/L — ABNORMAL LOW (ref 22–32)
Calcium: 8.6 mg/dL — ABNORMAL LOW (ref 8.9–10.3)
Chloride: 106 mmol/L (ref 98–111)
Creatinine, Ser: 1.19 mg/dL — ABNORMAL HIGH (ref 0.44–1.00)
GFR, Estimated: 52 mL/min — ABNORMAL LOW (ref >60)
Glucose, Bld: 106 mg/dL — ABNORMAL HIGH (ref 70–99)
Potassium: 4.4 mmol/L (ref 3.5–5.1)
Sodium: 135 mmol/L (ref 135–145)
Total Bilirubin: 1 mg/dL (ref 0.3–1.2)
Total Protein: 7 g/dL (ref 6.5–8.1)

## 2022-07-31 LAB — CBC
HCT: 35.3 % — ABNORMAL LOW (ref 36.0–46.0)
Hemoglobin: 11.9 g/dL — ABNORMAL LOW (ref 12.0–15.0)
MCH: 28.9 pg (ref 26.0–34.0)
MCHC: 33.7 g/dL (ref 30.0–36.0)
MCV: 85.7 fL (ref 80.0–100.0)
Platelets: 313 10*3/uL (ref 150–400)
RBC: 4.12 MIL/uL (ref 3.87–5.11)
RDW: 16.2 % — ABNORMAL HIGH (ref 11.5–15.5)
WBC: 9.9 10*3/uL (ref 4.0–10.5)
nRBC: 0 % (ref 0.0–0.2)

## 2022-07-31 NOTE — Discharge Summary (Signed)
Physician Discharge Summary   Patient: [name] MRN: [id number] DOB: 02/24/1961  Admit date:     07/26/2022  Discharge date: 07/31/22  Discharge Physician: Marylu Lund   PCP: Pcp, No   Recommendations at discharge:    Follow up with PCP in 1-2 weeks Consider referral to Endocrinology Pt to resume coumadin at previous dose on 8/28, per GI recs  Discharge Diagnoses: Principal Problem:   Dizziness Active Problems:   Bradycardia   Type 2 diabetes mellitus without complication, without long-term current use of insulin (HCC)   Chronic pain syndrome   Hematochezia   Tobacco use   Depression   Paroxysmal atrial fibrillation (HCC)   Hypercholesterolemia   Hypotension   Long term current use of anticoagulant therapy   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 *  Hospital Course: 61 yo female with PMH atrial fibrillation/flutter on Coumadin, valvular heart disease status post mitral and tricuspid valve repair (annuloplasty ring) on 04/19/2022 with pericardial effusion s/p pericardiocentesis 05/10/22, DMII, GERD, HTN, chronic pain syndrome discharged from the hospital on 07/24/2022, readmitted for persistent dizziness and syncope.  Patient also reports that she had bright red blood per rectum with bowel movement once since discharge.  On arrival to ED she was hypotensive, bradycardic which have resolved with IV fluids.  Assessment and Plan: Dizziness and lightheadedness, probable syncopal episode initially suspected secondary to overdose of oxycodone versus bradycardia and hypotension. -Patient was on metoprolol which was discontinued  this admission.   -Diltiazem was discontinued prior to discharge. -cardiology following. Pt s/p TEE 8/23 with findings of mod to severe TR, recs to avoid anemia/tachycardia. Recs to cont metoprolol  Per GI, recs to resume coumadin in 3 days at prior dose, and to avoid NSAID x 2 weeks    Acute kidney injury on stage IIIa CKD -Secondary to prerenal azotemia from hypotension holding Cozaar for now.   -Her blood pressure improved with fluid boluses  -Cr currently stable at 1.19    Acute anemia of blood loss Possibly lower GI bleed. Her baseline hemoglobin around 10 and she was admitted with a hemoglobin of 7.5, s/p 1 unit of PRBC transfusion.   Hgb stable at 11.1 GI consulted with Coumadin initially on hold. Pt underwent colonosocpy 8/25 with findings of 3 polyps with 2 nonbleeding colonic angiodysplasias treated with APC -GI recs for repeat colon in 3 years pending path -GI also recommended resuming coumadin 3 days after colonosocpy (restart on 8/28) at previous dose    Atrial fibrillation Rate controlled Coumadin on hold for concern for rectal bleeding. Rate controlling drugs have been held due to bradycardia and dizziness and syncope. Per GI, would resume coumadin at previous dose in 3 days post-colon    Chronic pain syndrome Patient had been on oxycodone 7.5 mg every 8 hours but she reports that he has been taking 15 mg every 8 hours in addition to methocarbamol.  She is at risk for opioid misuse and overdose and suspect her symptoms could partly be due to taking oxycodone 15 mg every 8 hours. Requesting increased pain meds NCCSR reviewed. Pt had been prescribed 120 tabs/30 days, which  corresponds to 4 tabs/day Was continued on oxycodone to q6h PRN    Type 2 diabetes mellitus Well-controlled CBGs and, recent a1c of 6.0 Continued with sliding scale insulin and gabapentin while in hospital    COPD -Patient reports that she is smoking and continues to smoke.   -No audible wheezing  on exam -Nicotine patch order. -On minimal O2 support    Mental health disorder:  -Adjusted medications due to risk of serotonin syndrome.     Hyperlipidemia:  -Continue with lipitor.        Consultants: GI, Cardiology Procedures performed: TEE, colonoscopy  Disposition: Home Diet recommendation:  Carb modified diet DISCHARGE MEDICATION: Allergies as of 07/31/2022       Reactions   Hydrocodone-acetaminophen Itching   Hydrocodone Itching   Tolerates oxycodone         Medication List     STOP taking these medications    diclofenac Sodium 1 % Gel Commonly known as: VOLTAREN       TAKE these medications    Accu-Chek FastClix Lancets Misc 1 each by Does not apply route daily. Check blood sugar once daily. 250.00   acetaminophen 500 MG tablet Commonly known as: TYLENOL Take 1,000 mg by mouth every 6 (six) hours as needed for mild pain.   albuterol 108 (90 Base) MCG/ACT inhaler Commonly known as: VENTOLIN HFA Inhale 2 puffs into the lungs every 6 (six) hours as needed for wheezing or shortness of breath.   ARIPiprazole 5 MG tablet Commonly known as: ABILIFY Take 5 mg by mouth daily.   atorvastatin 20 MG tablet Commonly known as: LIPITOR Take 20 mg by mouth daily.   blood glucose meter kit and supplies Kit Dispense based on patient and insurance preference. Use up to four times daily as directed.   buPROPion 300 MG 24 hr tablet Commonly known as: WELLBUTRIN XL Take 300 mg by mouth in the morning.   escitalopram 10 MG tablet Commonly known as: LEXAPRO Take 10 mg by mouth every morning.   FLUoxetine 20 MG capsule Commonly known as: PROZAC Take 20 mg by mouth every morning.   furosemide 20 MG tablet Commonly known as: LASIX Take 1 tablet (20 mg total) by mouth daily.   gabapentin 600 MG tablet Commonly known as: NEURONTIN Take 0.5 tablets (300 mg total) by mouth 3 (three) times daily.   glucose blood test strip Commonly known as: Accu-Chek  SmartView Check blood sugar once daily. 250.00   hydrOXYzine 25 MG tablet Commonly known as: ATARAX Take 25 mg by mouth every 4 (four) hours as needed for anxiety.   linagliptin 5 MG Tabs tablet Commonly known as: TRADJENTA Take 5 mg by mouth in the morning.   losartan 100 MG tablet Commonly known as: COZAAR Take 100 mg by mouth daily.   methocarbamol 500 MG tablet Commonly known as: ROBAXIN Take 1 tablet (500 mg total) by mouth every 8 (eight) hours as needed for muscle spasms.   metoprolol tartrate 50 MG tablet Commonly known as: LOPRESSOR Take 1 tablet (50 mg total) by mouth 2 (two) times daily.   naloxone 4 MG/0.1ML Liqd nasal spray kit Commonly known as: NARCAN Place 1 spray into the nose as needed (accidental overdose).   omeprazole 10 MG capsule Commonly known as: PRILOSEC Take 10 mg by mouth daily.   oxyCODONE 15 MG immediate release tablet Commonly known as: ROXICODONE Take 0.5 tablets (7.5 mg total) by mouth every 8 (eight) hours as needed for pain.   Premarin 0.45  MG tablet Generic drug: estrogens (conjugated) Take 0.45 mg by mouth daily.   traZODone 100 MG tablet Commonly known as: DESYREL Take 2.5 tablets (250 mg total) by mouth at bedtime as needed for sleep. What changed: how much to take   warfarin 5 MG tablet Commonly known as: COUMADIN Take as directed. If you are unsure how to take this medication, talk to your nurse or doctor. Original instructions: TAKE 1 TABLET BY MOUTH DAILY OR AS DIRECTED BY ANTICOAGULATION CLINIC. What changed: See the new instructions.   XIIDRA OP Place 1 drop into both eyes daily.        Follow-up Information     Tilda Franco, FNP Follow up.   Specialty: Family Medicine Why: NO ANSWER PLEASE CALL AND SCHEDULE APPT. Contact information: [place] [address] [phone]                Discharge Exam: Danley Danker Weights   07/28/22 1347 07/30/22 0508 07/31/22 0009  Weight: 93  kg 90.4 kg 91.8 kg   General exam: Awake, laying in bed, in nad Respiratory system: Normal respiratory effort, no wheezing Cardiovascular system: regular rate, s1, s2 Gastrointestinal system: Soft, nondistended, positive BS Central nervous system: CN2-12 grossly intact, strength intact Extremities: Perfused, no clubbing Skin: Normal skin turgor, no notable skin lesions seen Psychiatry: Mood normal // no visual hallucinations   Condition at discharge: fair  The results of significant diagnostics from this hospitalization (including imaging, microbiology, ancillary and laboratory) are listed below for reference.   Imaging Studies: ECHO TEE  Result Date: 07/28/2022    TRANSESOPHOGEAL ECHO REPORT   Patient Name:   KARNA ABED Date of Exam: 07/28/2022 Medical Rec #:  [id number]         Height:       71.0 in Accession #:    [id number]        Weight:       205.0 lb Date of Birth:  [birth year]         BSA:          2.131 m Patient Age:    61 years          BP:           114/72 mmHg Patient Gender: F                 HR:           83 bpm. Exam Location:  Inpatient Procedure: Transesophageal Echo, 3D Echo, Color Doppler and Cardiac Doppler Indications:     elevated mitral valve gradients  History:         Patient has prior history of Echocardiogram examinations, most                  recent 07/27/2022. Risk Factors:Diabetes, Hypertension, GERD and                  Current Smoker.                   Mitral Valve: prosthetic annuloplasty ring valve is present in                  the mitral position. Procedure Date: 04/19/22.  Sonographer:     Bernadene Person RDCS Referring Phys:  [id number] [facility] A Gasper Sells Diagnosing Phys: Sanda Klein MD PROCEDURE: After discussion of the risks and benefits of a TEE, an informed consent was obtained from the patient. The transesophogeal probe was passed without difficulty  through the esophogus of the patient. Sedation performed by different physician. The patient was  monitored while under deep sedation. Anesthestetic sedation was provided intravenously by Anesthesiology: 137.84m of Propofol, 849mof Lidocaine. The patient's vital signs; including heart rate, blood pressure, and oxygen saturation; remained stable throughout the procedure. The patient developed no complications during the procedure. IMPRESSIONS  1. Left ventricular ejection fraction, by estimation, is 60 to 65%. The left ventricle has normal function. The left ventricle has no regional wall motion abnormalities. There is mild concentric left ventricular hypertrophy. Left ventricular diastolic function could not be evaluated.  2. Right ventricular systolic function is normal. The right ventricular size is normal. There is moderately elevated pulmonary artery systolic pressure. The estimated right ventricular systolic pressure is 5493.7mHg.  3. Left atrial size was severely dilated. No left atrial/left atrial appendage thrombus was detected. The LAA emptying velocity was 70 cm/s.  4. Right atrial size was mildly dilated.  5. The mitral valve has been repaired/replaced. Mild mitral valve regurgitation. Mild mitral stenosis. The mean mitral valve gradient is 6.6 mmHg with average heart rate of 83 bpm. 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 Procedure Date: 04/19/22.  6. The tricuspid valve is has been repaired/replaced. 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 Tricuspid valve regurgitation is moderate.  7. The aortic valve is tricuspid. Aortic valve regurgitation is not visualized. No aortic stenosis is present. Comparison(s): A "double envelope" or "ghosting" artifact is seen during CW interrogation of the mitral inflow. This may have led to overestimation of the transmitral gradient on the previous study. FINDINGS  Left Ventricle: Left ventricular ejection fraction, by estimation, is 60 to 65%. The left ventricle has normal function. The left ventricle has  no regional wall motion abnormalities. The left ventricular internal cavity size was normal in size. There is  mild concentric left ventricular hypertrophy. Left ventricular diastolic function could not be evaluated due to mitral valve repair. Left ventricular diastolic function could not be evaluated. Right Ventricle: The right ventricular size is normal. No increase in right ventricular wall thickness. Right ventricular systolic function is normal. There is moderately elevated pulmonary artery systolic pressure. The tricuspid regurgitant velocity is 3.52 m/s, and with an assumed right atrial pressure of 5 mmHg, the estimated right ventricular systolic pressure is 5490.2mHg. Left Atrium: Left atrial size was severely dilated. No left atrial/left atrial appendage thrombus was detected. The LAA emptying velocity was 70 cm/s. Right Atrium: Right atrial size was mildly dilated. Pericardium: There is no evidence of pericardial effusion. Mitral Valve: The mitral valve has been repaired/replaced. Mild mitral valve regurgitation, with centrally-directed jet. 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 Procedure Date: 04/19/22. Mild mitral valve stenosis. The mean  mitral valve gradient is 6.6 mmHg with average heart rate of 83 bpm. Tricuspid Valve: The tricuspid valve is has been repaired/replaced. Tricuspid valve regurgitation is moderate. 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 Aortic Valve: The aortic valve is tricuspid. Aortic valve regurgitation is not visualized. No aortic stenosis is present. Pulmonic Valve: The pulmonic valve was normal in structure. Pulmonic valve regurgitation is not visualized. Aorta: The aortic root, ascending aorta, aortic arch and descending aorta are all structurally normal, with no evidence of dilitation or obstruction. There is minimal (Grade I) plaque. IAS/Shunts: No atrial level shunt detected by color flow Doppler.  MITRAL VALVE  TRICUSPID VALVE MV Area (PHT): 2.48 cm  TV Area (PHT):  2.26 cm MV Mean grad:  6.6 mmHg  TV Mean grad:   1.3 mmHg MV Decel Time: 306 msec  TV PHT:         97 msec                          TR Peak grad:   49.6 mmHg                          TR Vmax:        352.00 cm/s Dani Gobble Croitoru MD Electronically signed by Sanda Klein MD Signature Date/Time: 07/28/2022/2:32:07 PM    Final    ECHOCARDIOGRAM LIMITED  Result Date: 07/27/2022    ECHOCARDIOGRAM LIMITED REPORT   Patient Name:   AMERIE BEAUMONT Date of Exam: 07/27/2022 Medical Rec #:  [id number]         Height:       71.0 in Accession #:    [id number]        Weight:       210.2 lb Date of Birth:  1961-05-09         BSA:          2.154 m Patient Age:    86 years          BP:           155/82 mmHg Patient Gender: F                 HR:           87 bpm. Exam Location:  Inpatient Procedure: Limited Echo, Limited Color Doppler and Cardiac Doppler Indications:    mitral valve disorder  History:        Patient has prior history of Echocardiogram examinations, most                 recent 05/31/2022. CHF, Arrythmias:Bradycardia; Risk                 Factors:Dyslipidemia, Diabetes and Current Smoker.                  Mitral Valve: valve is present in the mitral position. Procedure                 Date: 04/19/22.  Sonographer:    Johny Chess RDCS Referring Phys: [id number] Volin A CHANDRASEKHAR IMPRESSIONS  1. Left ventricular ejection fraction, by estimation, is 50 to 55%. The left ventricle has low normal function.  2. Left atrial size was severely dilated.  3. Right atrial size was moderately dilated.  4. S/p MV prosthetic annuloplasty ring placed 04/19/2022. mod-severe mitral stenosis, MV mean gradient 15 mmHg, peak 27 mmhg at HR 87 bpm. Mild mitral valve regurgitation. There is a present in the mitral position. Procedure Date: 04/19/22.  5. 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 Tricuspid valve regurgitation is moderate.  6.  Aortic valve regurgitation is not visualized.  7. There is severely elevated pulmonary artery systolic pressure. Conclusion(s)/Recommendation(s): Compared to prior study, MV mean gradient has increased. May be related to rate. Consider TEE/cardiac CT if clinically indicated. FINDINGS  Left Ventricle: Left ventricular ejection fraction, by estimation, is 50 to 55%. The left ventricle has low normal function. Right Ventricle: There is severely elevated pulmonary artery systolic pressure. The tricuspid regurgitant velocity is 3.74 m/s, and with an  assumed right atrial pressure of 5 mmHg, the estimated right ventricular systolic pressure is 16.1 mmHg. Left Atrium: Left atrial size was severely dilated. Right Atrium: Right atrial size was moderately dilated. Mitral Valve: S/p MV prosthetic annuloplasty ring placed 04/19/2022. mod-severe mitral stenosis, MV mean gradient 15 mmHg, peak 27 mmhg at HR 87 bpm. Mild mitral valve regurgitation. There is a present in the mitral position. Procedure Date: 04/19/22. MV peak gradient, 26.8 mmHg. The mean mitral valve gradient is 15.0 mmHg. Tricuspid Valve: Tricuspid valve regurgitation is moderate. 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 Aortic Valve: Aortic valve regurgitation is not visualized. Pulmonic Valve: Pulmonic valve regurgitation is not visualized. LEFT VENTRICLE PLAX 2D LVOT diam:     2.30 cm LV SV:         88 LV SV Index:   41 LVOT Area:     4.15 cm  IVC IVC diam: 2.20 cm AORTIC VALVE             PULMONIC VALVE LVOT Vmax:   114.50 cm/s RVOT Peak grad: 1 mmHg LVOT Vmean:  75.250 cm/s LVOT VTI:    0.213 m MITRAL VALVE              TRICUSPID VALVE MV Area VTI:  1.53 cm    TV Peak grad:   6.5 mmHg MV Peak grad: 26.8 mmHg   TV Mean grad:   4.0 mmHg MV Mean grad: 15.0 mmHg   TV Vmax:        1.27 m/s MV Vmax:      2.59 m/s    TV Vmean:       93.6 cm/s MV Vmean:     183.0 cm/s  TV VTI:         0.29 msec MR Peak grad: 149.3 mmHg  TR Peak grad:   56.0  mmHg MR Mean grad: 97.0 mmHg   TR Vmax:        374.00 cm/s MR Vmax:      611.00 cm/s MR Vmean:     469.0 cm/s  SHUNTS                           Systemic VTI:  0.21 m                           Systemic Diam: 2.30 cm                           Pulmonic VTI:  0.107 m Phineas Inches Electronically signed by Phineas Inches Signature Date/Time: 07/27/2022/12:28:20 PM    Final    DG Chest Port 1 View  Result Date: 07/26/2022 CLINICAL DATA:  Syncope EXAM: PORTABLE CHEST 1 VIEW COMPARISON:  07/22/2022 FINDINGS: Transverse diameter of heart is increased. Central pulmonary vessels are more prominent. Increased interstitial markings are seen in the parahilar regions and lower lung fields. There is no focal consolidation. There is no pleural effusion or pneumothorax. 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 IMPRESSION: Central pulmonary vessels are more prominent. Increased interstitial markings are seen in the parahilar regions and lower lung fields. Findings suggest CHF with interstitial pulmonary edema. Electronically Signed   By: Elmer Picker M.D.   On: 07/26/2022 12:49   CT Head Wo Contrast  Result Date: 07/26/2022 CLINICAL DATA:  Head trauma, coagulopathy, syncopal episode striking head, history  MVR, d type II iabetes mellitus, hypertension, smoker EXAM: CT HEAD WITHOUT CONTRAST TECHNIQUE: Contiguous axial images were obtained from the base of the skull through the vertex without intravenous contrast. RADIATION DOSE REDUCTION: This exam was performed according to the departmental dose-optimization program which includes automated exposure control, adjustment of the mA and/or kV according to patient size and/or use of iterative reconstruction technique. COMPARISON:  05/22/2022 FINDINGS: Brain: Normal ventricular morphology. No midline shift or mass effect. Normal appearance of brain parenchyma. No intracranial hemorrhage, mass lesion, evidence of acute infarction, or extra-axial fluid  collection. Few scattered nonspecific dural calcifications unchanged. Vascular: No hyperdense vessels. Skull: Intact Sinuses/Orbits: Clear Other: N/A IMPRESSION: No acute intracranial abnormalities. Electronically Signed   By: Lavonia Dana M.D.   On: 07/26/2022 12:27   DG Chest Port 1 View  Result Date: 07/22/2022 CLINICAL DATA:  Back pain and bradycardia EXAM: PORTABLE CHEST 1 VIEW COMPARISON:  05/28/2022 FINDINGS: Cardiac shadow is within normal limits. Postsurgical changes are again seen. Lungs are well aerated bilaterally. No acute bony abnormality is seen. IMPRESSION: No active disease. Electronically Signed   By: Inez Catalina M.D.   On: 07/22/2022 19:38    Microbiology: Results for orders placed or performed during the hospital encounter of 05/28/22  Resp Panel by RT-PCR (Flu A&B, Covid) Anterior Nasal Swab     Status: None   Collection Time: 05/28/22  4:28 PM   Specimen: Anterior Nasal Swab  Result Value Ref Range Status   SARS Coronavirus 2 by RT PCR NEGATIVE NEGATIVE Final    Comment: (NOTE) SARS-CoV-2 target nucleic acids are NOT DETECTED.  The SARS-CoV-2 RNA is generally detectable in upper respiratory specimens during the acute phase of infection. The lowest concentration of SARS-CoV-2 viral copies this assay can detect is 138 copies/mL. A negative result does not preclude SARS-Cov-2 infection and should not be used as the sole basis for treatment or other patient management decisions. A negative result may occur with  improper specimen collection/handling, submission of specimen other than nasopharyngeal swab, presence of viral mutation(s) within the areas targeted by this assay, and inadequate number of viral copies(<138 copies/mL). A negative result must be combined with clinical observations, patient history, and epidemiological information. The expected result is Negative.  Fact Sheet for Patients:  EntrepreneurPulse.com.au  Fact Sheet for Healthcare  Providers:  IncredibleEmployment.be  This test is no t yet approved or cleared by the [place] FDA and  has been authorized for detection and/or diagnosis of SARS-CoV-2 by FDA under an Emergency Use Authorization (EUA). This EUA will remain  in effect (meaning this test can be used) for the duration of the COVID-19 declaration under Section 564(b)(1) of the Act, 21 U.S.C.section 360bbb-3(b)(1), unless the authorization is terminated  or revoked sooner.       Influenza A by PCR NEGATIVE NEGATIVE Final   Influenza B by PCR NEGATIVE NEGATIVE Final    Comment: (NOTE) The Xpert Xpress SARS-CoV-2/FLU/RSV plus assay is intended as an aid in the diagnosis of influenza from Nasopharyngeal swab specimens and should not be used as a sole basis for treatment. Nasal washings and aspirates are unacceptable for Xpert Xpress SARS-CoV-2/FLU/RSV testing.  Fact Sheet for Patients: EntrepreneurPulse.com.au  Fact Sheet for Healthcare Providers: IncredibleEmployment.be  This test is not yet approved or cleared by the [place] FDA and has been authorized for detection and/or diagnosis of SARS-CoV-2 by FDA under an Emergency Use Authorization (EUA). This EUA will remain in effect (meaning this test can be used) for  the duration of the COVID-19 declaration under Section 564(b)(1) of the Act, 21 U.S.C. section 360bbb-3(b)(1), unless the authorization is terminated or revoked.  Performed at [facility], [place] [address], [place], [place] [address]     Labs: CBC: Recent Labs  Lab 07/26/22 1115 07/26/22 1149 07/27/22 0110 07/28/22 1023 07/29/22 0451 07/30/22 0552 07/31/22 0633  WBC 5.3   < > 6.2 7.2 5.8 7.8 9.9  NEUTROABS 3.4  --   --   --   --   --   --   HGB 7.5*   < > 9.4* 11.1* 10.0* 11.1* 11.9*  HCT 24.0*   < > 29.4* 33.0* 29.8* 33.2* 35.3*  MCV 90.6   < > 89.9 84.4 85.4 86.5 85.7  PLT 201   < > 204 228 252 274 313    < > = values in this interval not displayed.   Basic Metabolic Panel: Recent Labs  Lab 07/27/22 0110 07/28/22 1023 07/29/22 0451 07/30/22 0552 07/31/22 0633  NA 137 138 138 139 135  K 4.2 4.3 4.2 4.2 4.4  CL 109 108 107 110 106  CO2 22 22 24  21* 19*  GLUCOSE 107* 88 91 94 106*  BUN 19 10 11 11 12   CREATININE 1.62* 0.93 1.13* 1.12* 1.19*  CALCIUM 7.6* 8.5* 8.2* 8.6* 8.6*   Liver Function Tests: Recent Labs  Lab 07/26/22 1115 07/28/22 1023 07/29/22 0451 07/30/22 0552 07/31/22 0633  AST 27 29 20  34 43*  ALT 17 18 16 16 18   ALKPHOS 60 76 65 71 75  BILITOT 0.4 0.7 0.3 0.8 1.0  PROT 5.5* 6.4* 5.7* 6.5 7.0  ALBUMIN 2.9* 3.1* 2.8* 3.2* 3.6   CBG: Recent Labs  Lab 07/30/22 1246 07/30/22 1616 07/30/22 2102 07/31/22 0625 07/31/22 1130  GLUCAP 83 81 77 119* 95    Discharge time spent: less than 30 minutes.  Signed: Marylu Lund, MD [facility] 07/31/2022

## 2022-08-02 ENCOUNTER — Encounter (HOSPITAL_COMMUNITY): Payer: Self-pay | Admitting: Gastroenterology

## 2022-08-02 ENCOUNTER — Encounter: Payer: Self-pay | Admitting: Gastroenterology

## 2022-08-02 ENCOUNTER — Observation Stay (HOSPITAL_BASED_OUTPATIENT_CLINIC_OR_DEPARTMENT_OTHER)
Admission: EM | Admit: 2022-08-02 | Discharge: 2022-08-04 | Disposition: A | Discharge status: Home Health | Payer: Medicare Other | Source: Home / Self Care | Attending: Emergency Medicine | Admitting: Emergency Medicine

## 2022-08-02 DIAGNOSIS — E1140 Type 2 diabetes mellitus with diabetic neuropathy, unspecified: Secondary | ICD-10-CM | POA: Insufficient documentation

## 2022-08-02 DIAGNOSIS — I10 Essential (primary) hypertension: Secondary | ICD-10-CM | POA: Insufficient documentation

## 2022-08-02 DIAGNOSIS — Z79899 Other long term (current) drug therapy: Secondary | ICD-10-CM | POA: Insufficient documentation

## 2022-08-02 DIAGNOSIS — D62 Acute posthemorrhagic anemia: Secondary | ICD-10-CM | POA: Insufficient documentation

## 2022-08-02 DIAGNOSIS — R778 Other specified abnormalities of plasma proteins: Secondary | ICD-10-CM

## 2022-08-02 DIAGNOSIS — I480 Paroxysmal atrial fibrillation: Secondary | ICD-10-CM | POA: Insufficient documentation

## 2022-08-02 DIAGNOSIS — Z7901 Long term (current) use of anticoagulants: Secondary | ICD-10-CM | POA: Insufficient documentation

## 2022-08-02 DIAGNOSIS — R55 Syncope and collapse: Secondary | ICD-10-CM | POA: Diagnosis present

## 2022-08-02 DIAGNOSIS — I5022 Chronic systolic (congestive) heart failure: Secondary | ICD-10-CM | POA: Insufficient documentation

## 2022-08-02 DIAGNOSIS — G4700 Insomnia, unspecified: Secondary | ICD-10-CM

## 2022-08-02 DIAGNOSIS — I110 Hypertensive heart disease with heart failure: Secondary | ICD-10-CM | POA: Insufficient documentation

## 2022-08-02 DIAGNOSIS — J9601 Acute respiratory failure with hypoxia: Secondary | ICD-10-CM | POA: Diagnosis not present

## 2022-08-02 DIAGNOSIS — Z7984 Long term (current) use of oral hypoglycemic drugs: Secondary | ICD-10-CM | POA: Insufficient documentation

## 2022-08-02 DIAGNOSIS — F17210 Nicotine dependence, cigarettes, uncomplicated: Secondary | ICD-10-CM | POA: Insufficient documentation

## 2022-08-02 DIAGNOSIS — I959 Hypotension, unspecified: Secondary | ICD-10-CM | POA: Insufficient documentation

## 2022-08-02 DIAGNOSIS — N179 Acute kidney failure, unspecified: Secondary | ICD-10-CM

## 2022-08-02 DIAGNOSIS — D500 Iron deficiency anemia secondary to blood loss (chronic): Secondary | ICD-10-CM | POA: Diagnosis present

## 2022-08-02 DIAGNOSIS — E1169 Type 2 diabetes mellitus with other specified complication: Secondary | ICD-10-CM

## 2022-08-02 DIAGNOSIS — D649 Anemia, unspecified: Secondary | ICD-10-CM

## 2022-08-02 DIAGNOSIS — R001 Bradycardia, unspecified: Secondary | ICD-10-CM | POA: Diagnosis present

## 2022-08-02 DIAGNOSIS — E119 Type 2 diabetes mellitus without complications: Secondary | ICD-10-CM

## 2022-08-02 LAB — SURGICAL PATHOLOGY

## 2022-08-03 ENCOUNTER — Observation Stay (HOSPITAL_COMMUNITY): Payer: Medicare Other

## 2022-08-03 ENCOUNTER — Other Ambulatory Visit: Payer: Self-pay

## 2022-08-03 ENCOUNTER — Encounter (HOSPITAL_COMMUNITY): Payer: Self-pay | Admitting: Internal Medicine

## 2022-08-03 DIAGNOSIS — D500 Iron deficiency anemia secondary to blood loss (chronic): Secondary | ICD-10-CM

## 2022-08-03 DIAGNOSIS — E119 Type 2 diabetes mellitus without complications: Secondary | ICD-10-CM

## 2022-08-03 DIAGNOSIS — I959 Hypotension, unspecified: Secondary | ICD-10-CM

## 2022-08-03 DIAGNOSIS — R55 Syncope and collapse: Secondary | ICD-10-CM | POA: Diagnosis present

## 2022-08-03 DIAGNOSIS — I5022 Chronic systolic (congestive) heart failure: Secondary | ICD-10-CM

## 2022-08-03 DIAGNOSIS — N179 Acute kidney failure, unspecified: Secondary | ICD-10-CM

## 2022-08-03 DIAGNOSIS — I480 Paroxysmal atrial fibrillation: Secondary | ICD-10-CM

## 2022-08-03 LAB — CBC WITH DIFFERENTIAL/PLATELET
Abs Immature Granulocytes: 0.02 10*3/uL (ref 0.00–0.07)
Basophils Absolute: 0.1 10*3/uL (ref 0.0–0.1)
Basophils Relative: 1 %
Eosinophils Absolute: 0.1 10*3/uL (ref 0.0–0.5)
Eosinophils Relative: 2 %
HCT: 29.6 % — ABNORMAL LOW (ref 36.0–46.0)
Hemoglobin: 9.2 g/dL — ABNORMAL LOW (ref 12.0–15.0)
Immature Granulocytes: 0 %
Lymphocytes Relative: 39 %
Lymphs Abs: 2.3 10*3/uL (ref 0.7–4.0)
MCH: 28.8 pg (ref 26.0–34.0)
MCHC: 31.1 g/dL (ref 30.0–36.0)
MCV: 92.8 fL (ref 80.0–100.0)
Monocytes Absolute: 0.5 10*3/uL (ref 0.1–1.0)
Monocytes Relative: 8 %
Neutro Abs: 3 10*3/uL (ref 1.7–7.7)
Neutrophils Relative %: 50 %
Platelets: 223 10*3/uL (ref 150–400)
RBC: 3.19 MIL/uL — ABNORMAL LOW (ref 3.87–5.11)
RDW: 17.2 % — ABNORMAL HIGH (ref 11.5–15.5)
WBC: 6 10*3/uL (ref 4.0–10.5)
nRBC: 0 % (ref 0.0–0.2)

## 2022-08-03 LAB — CBC
HCT: 30.9 % — ABNORMAL LOW (ref 36.0–46.0)
Hemoglobin: 9.1 g/dL — ABNORMAL LOW (ref 12.0–15.0)
MCH: 28.7 pg (ref 26.0–34.0)
MCHC: 29.4 g/dL — ABNORMAL LOW (ref 30.0–36.0)
MCV: 97.5 fL (ref 80.0–100.0)
Platelets: 217 10*3/uL (ref 150–400)
RBC: 3.17 MIL/uL — ABNORMAL LOW (ref 3.87–5.11)
RDW: 17.3 % — ABNORMAL HIGH (ref 11.5–15.5)
WBC: 6.3 10*3/uL (ref 4.0–10.5)
nRBC: 0 % (ref 0.0–0.2)

## 2022-08-03 LAB — COMPREHENSIVE METABOLIC PANEL
ALT: 12 U/L (ref 0–44)
AST: 21 U/L (ref 15–41)
Albumin: 3.3 g/dL — ABNORMAL LOW (ref 3.5–5.0)
Alkaline Phosphatase: 57 U/L (ref 38–126)
Anion gap: 12 (ref 5–15)
BUN: 26 mg/dL — ABNORMAL HIGH (ref 8–23)
CO2: 19 mmol/L — ABNORMAL LOW (ref 22–32)
Calcium: 7.3 mg/dL — ABNORMAL LOW (ref 8.9–10.3)
Chloride: 102 mmol/L (ref 98–111)
Creatinine, Ser: 3.14 mg/dL — ABNORMAL HIGH (ref 0.44–1.00)
GFR, Estimated: 16 mL/min — ABNORMAL LOW (ref >60)
Glucose, Bld: 84 mg/dL (ref 70–99)
Potassium: 4.1 mmol/L (ref 3.5–5.1)
Sodium: 133 mmol/L — ABNORMAL LOW (ref 135–145)
Total Bilirubin: 0.6 mg/dL (ref 0.3–1.2)
Total Protein: 5.9 g/dL — ABNORMAL LOW (ref 6.5–8.1)

## 2022-08-03 LAB — TROPONIN I (HIGH SENSITIVITY)
Troponin I (High Sensitivity): 62 ng/L — ABNORMAL HIGH (ref <18)
Troponin I (High Sensitivity): 49 ng/L — ABNORMAL HIGH (ref <18)

## 2022-08-03 LAB — CORTISOL: Cortisol, Plasma: 6.5 ug/dL

## 2022-08-03 LAB — HEMOGLOBIN AND HEMATOCRIT, BLOOD
HCT: 31.9 % — ABNORMAL LOW (ref 36.0–46.0)
Hemoglobin: 10 g/dL — ABNORMAL LOW (ref 12.0–15.0)

## 2022-08-03 LAB — PROTIME-INR
INR: 1.3 — ABNORMAL HIGH (ref 0.8–1.2)
Prothrombin Time: 15.8 seconds — ABNORMAL HIGH (ref 11.4–15.2)

## 2022-08-03 LAB — GLUCOSE, CAPILLARY
Glucose-Capillary: 114 mg/dL — ABNORMAL HIGH (ref 70–99)
Glucose-Capillary: 105 mg/dL — ABNORMAL HIGH (ref 70–99)

## 2022-08-03 LAB — LACTIC ACID, PLASMA
Lactic Acid, Venous: 1.1 mmol/L (ref 0.5–1.9)
Lactic Acid, Venous: 1.1 mmol/L (ref 0.5–1.9)

## 2022-08-03 LAB — TSH: TSH: 1.84 u[IU]/mL (ref 0.350–4.500)

## 2022-08-03 LAB — CBG MONITORING, ED
Glucose-Capillary: 134 mg/dL — ABNORMAL HIGH (ref 70–99)
Glucose-Capillary: 147 mg/dL — ABNORMAL HIGH (ref 70–99)

## 2022-08-03 LAB — PREPARE RBC (CROSSMATCH)

## 2022-08-03 MED ORDER — LACTATED RINGERS IV BOLUS
1000.0000 mL | Freq: Once | INTRAVENOUS | Status: AC
  Administered 2022-08-03: 1000 mL via INTRAVENOUS

## 2022-08-03 MED ORDER — INSULIN ASPART 100 UNIT/ML IJ SOLN: 0.0000 [IU] | Freq: Three times a day (TID) | INTRAMUSCULAR | Status: DC

## 2022-08-03 MED ORDER — ACETAMINOPHEN 650 MG RE SUPP: 650.0000 mg | Freq: Four times a day (QID) | RECTAL | Status: DC | PRN

## 2022-08-03 MED ORDER — WARFARIN - PHARMACIST DOSING INPATIENT: Freq: Every day | Status: DC

## 2022-08-03 MED ORDER — OXYCODONE HCL 5 MG PO TABS
5.0000 mg | ORAL_TABLET | Freq: Four times a day (QID) | ORAL | Status: DC | PRN
  Administered 2022-08-03 – 2022-08-04 (×3): 5 mg via ORAL
  Filled 2022-08-03 (×3): qty 1

## 2022-08-03 MED ORDER — ACETAMINOPHEN 325 MG PO TABS
650.0000 mg | ORAL_TABLET | Freq: Four times a day (QID) | ORAL | Status: DC | PRN
  Administered 2022-08-04: 650 mg via ORAL
  Filled 2022-08-03: qty 2

## 2022-08-03 MED ORDER — LACTATED RINGERS IV SOLN: INTRAVENOUS | Status: AC

## 2022-08-03 MED ORDER — WARFARIN SODIUM 5 MG PO TABS
5.0000 mg | ORAL_TABLET | Freq: Once | ORAL | Status: AC
  Administered 2022-08-03: 5 mg via ORAL
  Filled 2022-08-03 (×2): qty 1

## 2022-08-03 MED ORDER — ACETAMINOPHEN 10 MG/ML IV SOLN
1000.0000 mg | Freq: Once | INTRAVENOUS | Status: AC
Start: 2022-08-03 — End: 2022-08-03
  Administered 2022-08-03: 1000 mg via INTRAVENOUS
  Filled 2022-08-03: qty 100

## 2022-08-03 NOTE — Progress Notes (Signed)
ANTICOAGULATION CONSULT NOTE - Initial Consult  Pharmacy Consult for Warfarin  Indication: atrial fibrillation   Allergies  Allergen Reactions   Hydrocodone-Acetaminophen Itching   Hydrocodone Itching    Tolerates oxycodone     Patient Measurements: Height: '5\' 11"'$  (180.3 cm) Weight: 91.8 kg (202 lb 6.1 oz) IBW/kg (Calculated) : 70.8 Vital Signs: Temp: 98.2 F (36.8 C) (08/29 0456) Temp Source: Oral (08/29 0456) BP: 91/69 (08/29 0500) Pulse Rate: 44 (08/29 0500)  Labs: Recent Labs    07/31/22 0233 08/03/22 0047 08/03/22 0339  HGB 11.9* 9.2* 10.0*  HCT 35.3* 29.6* 31.9*  PLT 313 223  --   LABPROT 14.7 15.8*  --   INR 1.2 1.3*  --   CREATININE 1.19* 3.14*  --   TROPONINIHS  --  62* 49*    Estimated Creatinine Clearance: 23.2 mL/min (A) (by C-G formula based on SCr of 3.14 mg/dL (H)).   Medical History: Past Medical History:  Diagnosis Date   Acute pericardial effusion 05/17/2022   AKI (acute kidney injury) ([place]) 05/17/2022   Anxiety    Arthritis    Cervicalgia    Chondromalacia of patella    Chronic neck pain    Chronic pain syndrome    Depression    secondary to loss of her son at age 79   Diabetes mellitus    Diabetic neuropathy ([place])    DMII (diabetes mellitus, type 2) ([place])    Dysrhythmia    Enthesopathy of hip region    Fibromyalgia    GERD (gastroesophageal reflux disease)    Hep C w/o coma, chronic ([place])    Hepatitis C    diagnosed 2005   Hypertension    Insomnia    Insomnia    Low back pain    Lumbago    Mitral regurgitation    Obesity    Pain in joint, upper arm    Primary localized osteoarthrosis, lower leg    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 Plan was  to resume warfarin 3 days after colonoscopy. Hgb 10. No current GI bleeding reported. INR is low at 1.3. Noted pt in acute renal failure.   Goal of Therapy:  INR 2-3 Monitor platelets by anticoagulation protocol: Yes   Plan:  Warfarin 5 mg PO x 1 at 1600 today  Daily PT/INR Monitor closely for any GI bleeding  Narda Bonds, PharmD, BCPS Clinical Pharmacist Phone: [phone]

## 2022-08-03 NOTE — ED Provider Notes (Signed)
[facility] EMERGENCY DEPARTMENT Provider Note   CSN: [id number] Arrival date & time: 08/02/22  2352     History  Chief complaint: Syncope  [name] is a 61 y.o. female.  The history is provided by the patient.  She has history of hypertension, diabetes, hyperlipidemia, paroxysmal atrial fibrillation anticoagulated on warfarin, postural dizziness and comes in following an episode of syncope.  She states that her legs felt like Jell-O and she felt dizzy and passed out.  She denies chest pain, heaviness, tightness, pressure.  There was some nausea but no vomiting.  She denies any diaphoresis.  She was recently hospitalized for similar complaints.   Home Medications Prior to Admission medications   Medication Sig Start Date End Date Taking? Authorizing Provider  ACCU-CHEK FASTCLIX LANCETS MISC 1 each by Does not apply route daily. Check blood sugar once daily. 250.00 10/26/11   [name], DO  acetaminophen (TYLENOL) 500 MG tablet Take 1,000 mg by mouth every 6 (six) hours as needed for mild pain.    [provider]  albuterol (PROVENTIL HFA;VENTOLIN HFA) 108 (90 Base) MCG/ACT inhaler Inhale 2 puffs into the lungs every 6 (six) hours as needed for wheezing or shortness of breath.     [provider]  ARIPiprazole (ABILIFY) 5 MG tablet Take 5 mg by mouth daily. 07/20/22   [provider]  atorvastatin (LIPITOR) 20 MG tablet Take 20 mg by mouth daily.    [provider]  blood glucose meter kit and supplies KIT Dispense based on patient and insurance preference. Use up to four times daily as directed. 05/26/22   [name], MD  buPROPion (WELLBUTRIN XL) 300 MG 24 hr tablet Take 300 mg by mouth in the morning. 03/24/22   [provider]  escitalopram (LEXAPRO) 10 MG tablet Take 10 mg by mouth every morning. 07/08/22   [provider]  FLUoxetine (PROZAC) 20 MG capsule Take 20 mg by mouth every morning.  04/14/22   [provider]  furosemide (LASIX) 20 MG tablet Take 1 tablet (20 mg total) by mouth daily. 05/27/22   [name], MD  gabapentin (NEURONTIN) 600 MG tablet Take 0.5 tablets (300 mg total) by mouth 3 (three) times daily. 05/30/22   [name], [name], MD  glucose blood (ACCU-CHEK SMARTVIEW) test strip Check blood sugar once daily. 250.00 10/26/11 12/30/21  [name], DO  hydrOXYzine (ATARAX) 25 MG tablet Take 25 mg by mouth every 4 (four) hours as needed for anxiety.    [provider]  Lifitegrast [facility]) Place 1 drop into both eyes daily.    [provider]  linagliptin (TRADJENTA) 5 MG TABS tablet Take 5 mg by mouth in the morning.    [provider]  losartan (COZAAR) 100 MG tablet Take 100 mg by mouth daily. 07/06/22   [provider]  methocarbamol (ROBAXIN) 500 MG tablet Take 1 tablet (500 mg total) by mouth every 8 (eight) hours as needed for muscle spasms. 05/26/22   [name], MD  metoprolol tartrate (LOPRESSOR) 50 MG tablet Take 1 tablet (50 mg total) by mouth 2 (two) times daily. 05/17/22   [name], NP  naloxone [facility]) nasal spray 4 mg/0.1 mL Place 1 spray into the nose as needed (accidental overdose).    [provider]  omeprazole (PRILOSEC) 10 MG capsule Take 10 mg by mouth daily. 07/02/22   [provider]  oxyCODONE (ROXICODONE) 15 MG immediate release  tablet Take 0.5 tablets (7.5 mg total) by mouth every 8 (eight) hours as needed for pain. 05/30/22   [name], [name], MD  PREMARIN 0.45 MG tablet Take 0.45 mg by mouth daily. 01/11/22   [provider]  traZODone (DESYREL) 100 MG tablet Take 2.5 tablets (250 mg total) by mouth at bedtime as needed for sleep. Patient taking differently: Take 200 mg by mouth at bedtime as needed for sleep. 11/23/18   [name], MD  warfarin (COUMADIN) 5 MG tablet TAKE 1 TABLET BY MOUTH DAILY OR AS DIRECTED BY ANTICOAGULATION CLINIC. Patient  taking differently: Take 5 mg by mouth daily. Take 1 tablet by mouth daily and 1.5 on sundays 07/02/22   Werner Lean, MD      Allergies    Hydrocodone-acetaminophen and Hydrocodone    Review of Systems   Review of Systems  All other systems reviewed and are negative.   Physical Exam Updated Vital Signs LMP 12/06/1976  Physical Exam Vitals and nursing note reviewed.   61 year old female, resting comfortably and in no acute distress. Vital signs are significant for low blood pressure and slightly slow heart rate. Oxygen saturation is 95%, which is normal. Head is normocephalic and atraumatic. PERRLA, EOMI. Oropharynx is clear.  Mucous membranes are dry. Neck is nontender and supple without adenopathy or JVD. Back is nontender and there is no CVA tenderness. Lungs are clear without rales, wheezes, or rhonchi. Chest is nontender. Heart has regular rate and rhythm without murmur. Abdomen is soft, flat, nontender. Extremities have no cyanosis or edema, full range of motion is present. Skin is warm and dry without rash. Neurologic: Mental status is normal, cranial nerves are intact, moves all extremities equally.  ED Results / Procedures / Treatments   Labs (all labs ordered are listed, but only abnormal results are displayed) Labs Reviewed  COMPREHENSIVE METABOLIC PANEL - Abnormal; Notable for the following components:      Result Value   Sodium 133 (*)    CO2 19 (*)    BUN 26 (*)    Creatinine, Ser 3.14 (*)    Calcium 7.3 (*)    Total Protein 5.9 (*)    Albumin 3.3 (*)    GFR, Estimated 16 (*)    All other components within normal limits  CBC WITH DIFFERENTIAL/PLATELET - Abnormal; Notable for the following components:   RBC 3.19 (*)    Hemoglobin 9.2 (*)    HCT 29.6 (*)    RDW 17.2 (*)    All other components within normal limits  PROTIME-INR - Abnormal; Notable for the following components:   Prothrombin Time 15.8 (*)    INR 1.3 (*)    All other components  within normal limits  HEMOGLOBIN AND HEMATOCRIT, BLOOD - Abnormal; Notable for the following components:   Hemoglobin 10.0 (*)    HCT 31.9 (*)    All other components within normal limits  TROPONIN I (HIGH SENSITIVITY) - Abnormal; Notable for the following components:   Troponin I (High Sensitivity) 62 (*)    All other components within normal limits  TROPONIN I (HIGH SENSITIVITY) - Abnormal; Notable for the following components:   Troponin I (High Sensitivity) 49 (*)    All other components within normal limits  LACTIC ACID, PLASMA  LACTIC ACID, PLASMA  TSH  CBC  CORTISOL  TYPE AND SCREEN  PREPARE RBC (CROSSMATCH)    EKG EKG Interpretation  Date/Time:  Tuesday August 03 2022 01:00:30 EDT Ventricular Rate:  51 PR Interval:  187 QRS Duration: 110 QT Interval:  564 QTC Calculation: 520 R Axis:   21 Text Interpretation: Sinus or ectopic atrial rhythm Abnormal T, consider ischemia, diffuse leads Prolonged QT interval When compared with ECG of 07/26/2022, T wave abnormality is now present Confirmed by Delora Fuel ([id number]) on 08/03/2022 2:12:09 AM  Radiology DG CHEST PORT 1 VIEW  Result Date: 08/03/2022 CLINICAL DATA:  Shortness of breath EXAM: PORTABLE CHEST 1 VIEW COMPARISON:  Eight days ago FINDINGS: Interstitial opacity with some Kerley lines. No visible effusion. Cardiopericardial enlargement. Prior median sternotomy for AV valve repairs. IMPRESSION: CHF pattern. Electronically Signed   By: Jorje Guild M.D.   On: 08/03/2022 06:40   DG Knee 1-2 Views Left  Result Date: 08/03/2022 CLINICAL DATA:  Syncope and fall EXAM: LEFT KNEE - 2 VIEW COMPARISON:  05/28/2022 FINDINGS: No evidence of fracture, dislocation, or joint effusion. Joint space narrowing with mild spurring at the medial compartment. IMPRESSION: No acute finding. 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 Electronically Signed   By: Jorje Guild M.D.   On: 08/03/2022 04:16   DG Knee 1-2 Views Right  Result Date:  08/03/2022 CLINICAL DATA:  Syncope and fall EXAM: RIGHT KNEE - 2 VIEW COMPARISON:  None Available. FINDINGS: No evidence of fracture, dislocation, or joint effusion. Minimal marginal spurring at the medial compartment. IMPRESSION: No acute finding. Electronically Signed   By: Jorje Guild M.D.   On: 08/03/2022 04:15    Procedures Procedures  Cardiac monitor shows sinus bradycardia, per my interpretation.  Medications Ordered in ED Medications  insulin aspart (novoLOG) injection 0-6 Units (has no administration in time range)  lactated ringers infusion ( Intravenous New Bag/Given 08/03/22 0558)  acetaminophen (TYLENOL) tablet 650 mg (has no administration in time range)    Or  acetaminophen (TYLENOL) suppository 650 mg (has no administration in time range)  Warfarin - Pharmacist Dosing Inpatient (has no administration in time range)  warfarin (COUMADIN) tablet 5 mg (has no administration in time range)  lactated ringers bolus 1,000 mL (0 mLs Intravenous Stopped 08/03/22 0147)  lactated ringers bolus 1,000 mL (0 mLs Intravenous Stopped 08/03/22 0607)  acetaminophen (OFIRMEV) IV 1,000 mg (0 mg Intravenous Stopped 08/03/22 0627)  lactated ringers bolus 1,000 mL (0 mLs Intravenous Stopped 08/03/22 0607)    ED Course/ Medical Decision Making/ A&P                           Medical Decision Making Amount and/or Complexity of Data Reviewed Labs: ordered.  Risk Decision regarding hospitalization.   Syncope with hypotension, possible dehydration.  Old records are reviewed, and she was hospitalized on 07/22/2022 for symptomatic bradycardia, hospitalized again on 07/26/2022 for syncope and hypotension.  She did have a pericardial effusion with with pericardiocentesis on 05/10/2022.  Echocardiogram on 07/27/2022 showed no pericardial effusion.  I have ordered screening labs including comprehensive metabolic panel, lactic acid level, troponin and I have ordered IV fluids.  Blood pressure had minimal  response to IV fluids and I have ordered additional IV fluids.  I have reviewed and interpreted her laboratory tests and my interpretation is worsening anemia with 2.7 g drop in hemoglobin over the prior 3 days, acute renal failure with creatinine 3.14 compared with 1.193 days ago, mild hyponatremia which is not felt to be clinically significant.  Troponin is mildly elevated, felt to represent demand ischemia and not ACS.  Repeat troponin has actually decreased.  Because of concern  for possible bleeding, hemoglobin was repeated and my interpretation is stable hemoglobin no evidence of active bleeding.  INR is subtherapeutic.  I have reviewed and interpreted her ECG, and my interpretation is diffuse T wave inversions concerning for ischemia which is new, prolonged QT interval which is stable.  ECG changes are felt to be related to hypotension and not ACS.  Case is discussed with Dr. Hal Hope of [facility] hospitalist, who agrees to admit the patient.  CRITICAL CARE Performed by: Delora Fuel Total critical care time: 90 minutes Critical care time was exclusive of separately billable procedures and treating other patients. Critical care was necessary to treat or prevent imminent or life-threatening deterioration. Critical care was time spent personally by me on the following activities: development of treatment plan with patient and/or surrogate as well as nursing, discussions with consultants, evaluation of patient's response to treatment, examination of patient, obtaining history from patient or surrogate, ordering and performing treatments and interventions, ordering and review of laboratory studies, ordering and review of radiographic studies, pulse oximetry and re-evaluation of patient's condition.  Final Clinical Impression(s) / ED Diagnoses Final diagnoses:  Hypotension, unspecified hypotension type  Acute kidney injury (nontraumatic) (HCC)  Normochromic normocytic anemia  Elevated troponin    Rx /  DC Orders ED Discharge Orders     None         Delora Fuel, MD 85/92/92 [phone]

## 2022-08-03 NOTE — ED Notes (Signed)
Per Dr. Hal Hope no blood transfusion is to be given

## 2022-08-03 NOTE — ED Triage Notes (Signed)
BIB [facility] from home. Syncope at 1900 felt dizzy, denies any injuries, on blood thinners coumadin. Here for same 2 days ago.

## 2022-08-03 NOTE — H&P (Addendum)
History and Physical    [name] OFB:[id number] DOB: 1961/06/20 DOA: 08/02/2022  PCP: Pcp, No  Patient coming from: Home.  Chief Complaint: Loss of consciousness.  HPI: [name] is a 61 y.o. female with history of paroxysmal atrial fibrillation, diabetes mellitus, hypertension, mitral valve and tricuspid valve repair subsequent to which patient also had cardiac tamponade early part of this year had underwent pericardiocentesis recently admitted last week for GI bleed had colon polyps removed and also had angiodysplasia and APC presents to the ER with having lost consciousness.  Patient states after discharge about 3 days ago patient has been getting dizzy mostly on trying to exert.  Denies any chest pain productive cough nausea vomiting or diarrhea has not noticed any blood in the stools.  She states she was talking to her friends in the yard when she felt dizzy and passed out.  ED Course: In the ER patient was hypotensive with blood pressure systolic in the 78E.  Hemoglobin has dropped by 3 g but repeat hemoglobin has been stable at around 10.  Blood pressure improved with fluids EKG shows sinus bradycardia.  Patient admitted for further observation.  Review of Systems: As per HPI, rest all negative.   Past Medical History:  Diagnosis Date   Acute pericardial effusion 05/17/2022   AKI (acute kidney injury) ([place]) 05/17/2022   Anxiety    Arthritis    Cervicalgia    Chondromalacia of patella    Chronic neck pain    Chronic pain syndrome    Depression    secondary to loss of her son at age 76   Diabetes mellitus    Diabetic neuropathy ([place])    DMII (diabetes mellitus, type 2) ([place])    Dysrhythmia    Enthesopathy of hip region    Fibromyalgia    GERD (gastroesophageal reflux disease)    Hep C w/o coma, chronic ([place])    Hepatitis C    diagnosed 2005   Hypertension    Insomnia    Insomnia    Low back pain    Lumbago    Mitral regurgitation    Obesity    Pain  in joint, upper arm    Primary localized osteoarthrosis, lower leg    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   Substance abuse ([place])    sober since 2001   Tobacco abuse    Tricuspid regurgitation    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    Past Surgical History:  Procedure Laterality Date   ABDOMINAL HYSTERECTOMY     at age 66, unknown reasons   CARPAL TUNNEL RELEASE  12/07/2011   left side   COLONOSCOPY WITH PROPOFOL N/A 07/30/2022   Procedure: COLONOSCOPY WITH PROPOFOL;  Surgeon: Ladene Artist, MD;  Location: [place];  Service: Gastroenterology;  Laterality: N/A;   EYE SURGERY     FOOT SURGERY Bilateral    HOT HEMOSTASIS N/A 07/30/2022   Procedure: HOT HEMOSTASIS (ARGON PLASMA COAGULATION/BICAP);  Surgeon: Ladene Artist, MD;  Location: [place];  Service: Gastroenterology;  Laterality: N/A;   MITRAL VALVE REPAIR N/A 04/19/2022   Procedure: MITRAL VALVE REPAIR WITH 30MM PHYSIO II ANNULOPLASTY RING;  Surgeon: Melrose Nakayama, MD;  Location: [place];  Service: Open Heart Surgery;  Laterality: N/A;   PERICARDIOCENTESIS N/A 05/10/2022   Procedure: PERICARDIOCENTESIS;  Surgeon: Jettie Booze, MD;  Location: [place] [facility];  Service: Cardiovascular;  Laterality: N/A;   POLYPECTOMY  07/30/2022   Procedure: POLYPECTOMY;  Surgeon: Ladene Artist, MD;  Location: [facility] ENDOSCOPY;  Service: Gastroenterology;;   RIGHT/LEFT HEART CATH AND CORONARY ANGIOGRAPHY N/A 02/12/2022   Procedure: RIGHT/LEFT HEART CATH AND CORONARY ANGIOGRAPHY;  Surgeon: Belva Crome, MD;  Location: [place] [facility];  Service: Cardiovascular;  Laterality: N/A;   TEE WITHOUT CARDIOVERSION N/A 02/03/2022   Procedure: TRANSESOPHAGEAL ECHOCARDIOGRAM (TEE);  Surgeon: Josue Hector, MD;  Location: [facility] ENDOSCOPY;  Service: Cardiovascular;  Laterality: N/A;   TEE WITHOUT CARDIOVERSION N/A 04/19/2022   Procedure: TRANSESOPHAGEAL ECHOCARDIOGRAM (TEE);  Surgeon: Melrose Nakayama, MD;   Location: [place];  Service: Open Heart Surgery;  Laterality: N/A;   TEE WITHOUT CARDIOVERSION N/A 07/28/2022   Procedure: TRANSESOPHAGEAL ECHOCARDIOGRAM (TEE);  Surgeon: Sanda Klein, MD;  Location: [place];  Service: Cardiovascular;  Laterality: N/A;   TRICUSPID VALVE REPLACEMENT N/A 04/19/2022   Procedure: TRICUSPID VALVE REPAIR WITH 32MM MC3 ANNULOPLASTY RING;  Surgeon: Melrose Nakayama, MD;  Location: [place];  Service: Open Heart Surgery;  Laterality: N/A;     reports that she has been smoking cigarettes. She has a 8.75 pack-year smoking history. She has never used smokeless tobacco. She reports that she does not drink alcohol and does not use drugs.  Allergies  Allergen Reactions   Hydrocodone-Acetaminophen Itching   Hydrocodone Itching    Tolerates oxycodone     Family History  Problem Relation Age of Onset   Uterine cancer Mother    Alcohol abuse Father    Alcohol abuse Sister    Drug abuse Sister    Drug abuse Brother    Alcohol abuse Brother    Schizophrenia Son    Diabetes Other    Arthritis Other    Hypertension Other    Heart attack Son 65       Died suddenly    Prior to Admission medications   Medication Sig Start Date End Date Taking? Authorizing Provider  ACCU-CHEK FASTCLIX LANCETS MISC 1 each by Does not apply route daily. Check blood sugar once daily. 250.00 10/26/11   [name], DO  acetaminophen (TYLENOL) 500 MG tablet Take 1,000 mg by mouth every 6 (six) hours as needed for mild pain.    [provider]  albuterol (PROVENTIL HFA;VENTOLIN HFA) 108 (90 Base) MCG/ACT inhaler Inhale 2 puffs into the lungs every 6 (six) hours as needed for wheezing or shortness of breath.     [provider]  ARIPiprazole (ABILIFY) 5 MG tablet Take 5 mg by mouth daily. 07/20/22   [provider]  atorvastatin (LIPITOR) 20 MG tablet Take 20 mg by mouth daily.    [provider]  blood glucose meter kit and supplies KIT  Dispense based on patient and insurance preference. Use up to four times daily as directed. 05/26/22   [name], MD  buPROPion (WELLBUTRIN XL) 300 MG 24 hr tablet Take 300 mg by mouth in the morning. 03/24/22   [provider]  escitalopram (LEXAPRO) 10 MG tablet Take 10 mg by mouth every morning. 07/08/22   [provider]  FLUoxetine (PROZAC) 20 MG capsule Take 20 mg by mouth every morning. 04/14/22   [provider]  furosemide (LASIX) 20 MG tablet Take 1 tablet (20 mg total) by mouth daily. 05/27/22   [name], MD  gabapentin (NEURONTIN) 600 MG tablet Take 0.5 tablets (300 mg total) by mouth 3 (three) times daily. 05/30/22   [name], [name], MD  glucose blood (ACCU-CHEK SMARTVIEW) test strip Check blood  sugar once daily. 250.00 10/26/11 12/30/21  [name], DO  hydrOXYzine (ATARAX) 25 MG tablet Take 25 mg by mouth every 4 (four) hours as needed for anxiety.    [provider]  Lifitegrast [facility]) Place 1 drop into both eyes daily.    [provider]  linagliptin (TRADJENTA) 5 MG TABS tablet Take 5 mg by mouth in the morning.    [provider]  losartan (COZAAR) 100 MG tablet Take 100 mg by mouth daily. 07/06/22   [provider]  methocarbamol (ROBAXIN) 500 MG tablet Take 1 tablet (500 mg total) by mouth every 8 (eight) hours as needed for muscle spasms. 05/26/22   [name], MD  metoprolol tartrate (LOPRESSOR) 50 MG tablet Take 1 tablet (50 mg total) by mouth 2 (two) times daily. 05/17/22   [name], NP  naloxone [facility]) nasal spray 4 mg/0.1 mL Place 1 spray into the nose as needed (accidental overdose).    [provider]  omeprazole (PRILOSEC) 10 MG capsule Take 10 mg by mouth daily. 07/02/22   [provider]  oxyCODONE (ROXICODONE) 15 MG immediate release tablet Take 0.5 tablets (7.5 mg total) by mouth every 8 (eight) hours as needed for pain. 05/30/22   [name], [name], MD   PREMARIN 0.45 MG tablet Take 0.45 mg by mouth daily. 01/11/22   [provider]  traZODone (DESYREL) 100 MG tablet Take 2.5 tablets (250 mg total) by mouth at bedtime as needed for sleep. Patient taking differently: Take 200 mg by mouth at bedtime as needed for sleep. 11/23/18   [name], MD  warfarin (COUMADIN) 5 MG tablet TAKE 1 TABLET BY MOUTH DAILY OR AS DIRECTED BY ANTICOAGULATION CLINIC. Patient taking differently: Take 5 mg by mouth daily. Take 1 tablet by mouth daily and 1.5 on sundays 07/02/22   Werner Lean, MD    Physical Exam: Constitutional: Moderately built and nourished. Vitals:   08/03/22 0145 08/03/22 0200 08/03/22 0205 08/03/22 0345  BP: (!) 83/70 (!) 82/59 90/64   Pulse: (!) 52 (!) 53 (!) 53   Resp:      Temp:    98.5 F (36.9 C)  TempSrc:    Oral  SpO2: 94% 94% 95%   Weight:    91.8 kg  Height:    5' 11"  (1.803 m)   Eyes: Anicteric no pallor. ENMT: No discharge from the ears eyes nose and mouth. Neck: No mass felt.  No neck rigidity. Respiratory: No rhonchi or crepitations. Cardiovascular: S1-S2 heard. Abdomen: Soft nontender bowel sound present. Musculoskeletal: No edema. Skin: No rash. Neurologic: Alert awake oriented to time place and person.  Moves all extremities. Psychiatric: Appears normal.  Normal affect.   Labs on Admission: I have personally reviewed following labs and imaging studies  CBC: Recent Labs  Lab 07/28/22 1023 07/29/22 0451 07/30/22 0552 07/31/22 0633 08/03/22 0047  WBC 7.2 5.8 7.8 9.9 6.0  NEUTROABS  --   --   --   --  3.0  HGB 11.1* 10.0* 11.1* 11.9* 9.2*  HCT 33.0* 29.8* 33.2* 35.3* 29.6*  MCV 84.4 85.4 86.5 85.7 92.8  PLT 228 252 274 313 779   Basic Metabolic Panel: Recent Labs  Lab 07/28/22 1023 07/29/22 0451 07/30/22 0552 07/31/22 0633 08/03/22 0047  NA 138 138 139 135 133*  K 4.3 4.2 4.2 4.4 4.1  CL 108 107 110 106 102  CO2 22 24 21* 19* 19*  GLUCOSE 88 91 94 106*  84  BUN 10 11 11  12  26*  CREATININE 0.93 1.13* 1.12* 1.19* 3.14*  CALCIUM 8.5* 8.2* 8.6* 8.6* 7.3*   GFR: Estimated Creatinine Clearance: 23.2 mL/min (A) (by C-G formula based on SCr of 3.14 mg/dL (H)). Liver Function Tests: Recent Labs  Lab 07/28/22 1023 07/29/22 0451 07/30/22 0552 07/31/22 0633 08/03/22 0047  AST 29 20 34 43* 21  ALT 18 16 16 18 12   ALKPHOS 76 65 71 75 57  BILITOT 0.7 0.3 0.8 1.0 0.6  PROT 6.4* 5.7* 6.5 7.0 5.9*  ALBUMIN 3.1* 2.8* 3.2* 3.6 3.3*   No results for input(s): "LIPASE", "AMYLASE" in the last 168 hours. No results for input(s): "AMMONIA" in the last 168 hours. Coagulation Profile: Recent Labs  Lab 07/28/22 1537 07/29/22 0451 07/30/22 0552 07/31/22 0633 08/03/22 0047  INR 1.3* 1.3* 1.2 1.2 1.3*   Cardiac Enzymes: No results for input(s): "CKTOTAL", "CKMB", "CKMBINDEX", "TROPONINI" in the last 168 hours. BNP (last 3 results) No results for input(s): "PROBNP" in the last 8760 hours. HbA1C: No results for input(s): "HGBA1C" in the last 72 hours. CBG: Recent Labs  Lab 07/30/22 1246 07/30/22 1616 07/30/22 2102 07/31/22 0625 07/31/22 1130  GLUCAP 83 81 77 119* 95   Lipid Profile: No results for input(s): "CHOL", "HDL", "LDLCALC", "TRIG", "CHOLHDL", "LDLDIRECT" in the last 72 hours. Thyroid Function Tests: No results for input(s): "TSH", "T4TOTAL", "FREET4", "T3FREE", "THYROIDAB" in the last 72 hours. Anemia Panel: No results for input(s): "VITAMINB12", "FOLATE", "FERRITIN", "TIBC", "IRON", "RETICCTPCT" in the last 72 hours. Urine analysis:    Component Value Date/Time   COLORURINE YELLOW 07/23/2022 0109   APPEARANCEUR HAZY (A) 07/23/2022 0109   LABSPEC 1.010 07/23/2022 0109   PHURINE 5.0 07/23/2022 0109   GLUCOSEU NEGATIVE 07/23/2022 0109   GLUCOSEU NEG mg/dL 01/09/2009 2129   HGBUR NEGATIVE 07/23/2022 0109   HGBUR negative 02/27/2007 1327   BILIRUBINUR NEGATIVE 07/23/2022 0109   KETONESUR NEGATIVE 07/23/2022 0109   PROTEINUR NEGATIVE  07/23/2022 0109   UROBILINOGEN 1.0 06/10/2010 1729   NITRITE NEGATIVE 07/23/2022 0109   LEUKOCYTESUR NEGATIVE 07/23/2022 0109   Sepsis Labs: @LABRCNTIP (procalcitonin:4,lacticidven:4) )No results found for this or any previous visit (from the past 240 hour(s)).   Radiological Exams on Admission: No results found.  EKG: Independently reviewed.  Sinus bradycardia T wave inversions.  Prolonged QTc.  Assessment/Plan Principal Problem:   Syncope Active Problems:   Bradycardia   Type 2 diabetes mellitus without complication, without long-term current use of insulin (HCC)   Paroxysmal atrial fibrillation (HCC)   Anemia, blood loss   Chronic systolic CHF (congestive heart failure) (HCC)    Syncope -patient was hypotensive at presentation and also bradycardic.  We will hold all antihypertensives and beta-blockers which was recently restarted.  We will continue with hydration monitor in telemetry.  CT head is pending.  Check cortisol levels. Paroxysmal atrial fibrillation presently sinus bradycardia holding beta-blockers.  Checking TSH.  Monitor in telemetry.  On Coumadin.  Coumadin was just restarted. Acute renal failure with creatinine has doubled when compared to recent past it was 1.1 about 3 days ago and it is 3.1 now.  Hold antihypertensives gently hydrate follow metabolic panel intake output.  If creatinine does not improve consider further imaging. Anemia with recent GI bleed CBC stable with repeat recheck is around 10.  Will trend CBC.  Recently admitted for GI bleed and had underwent colonoscopy which showed a angiodysplasia underwent APC.  At the time patient also had polyp removal. Recent mitral valve  and tricuspid valve repair secondary to rheumatic heart valve had complication with cardiac tamponade underwent pericardiocentesis during early part of this year.  Recent TEE done last week was unremarkable.  We will continue to monitor. Diabetes mellitus type 2 we will keep patient on  sliding scale coverage.   Chronic pain appears to be drowsy and also has renal failure will hold all narcotics.   DVT prophylaxis: Coumadin. Code Status: Full code. Family Communication: Gust with patient. Disposition Plan: Home. Consults called: None. Admission status: Observation.   Rise Patience MD [facility] Pager [phone].  If 7PM-7AM, please contact night-coverage [email or web address] Password [email or web address]  08/03/2022, 3:50 AM

## 2022-08-03 NOTE — ED Notes (Signed)
Admit provider is at bedside at this time

## 2022-08-03 NOTE — ED Notes (Signed)
Pt somnolent. Asking for ice when this RN enters. Placed on [place] while sleeping. 88% on room and and mouth breathing. 94% on [place] at 6LPM. MAP 63- bolus still infusing.

## 2022-08-03 NOTE — ED Notes (Signed)
RN notified Dr about BP

## 2022-08-03 NOTE — Progress Notes (Signed)
[facility] PROGRESS NOTE    Progress Note  [name]  QVZ:[id number] DOB: 11/06/1961 DOA: 08/02/2022 PCP: Pcp, No     Brief Narrative:   [name] is an 61 y.o. female past medical history of paroxysmal atrial fibrillation on Coumadin, diabetes mellitus type 2, essential hypertension with status post repair on mitral and transcatheter valve, history of cardiac tamponade earlier this year underwent pericardiocentesis, recently discharged last week for GI bleed after colonic polyp removal and angiodysplasia comes in for loss of consciousness.  Found hypotensive in the ED systolic blood pressure in the 80s hemoglobin dropped 3 g  Assessment/Plan:   Syncope and collapse: Hypotensive and bradycardic in the ED fluid resuscitated. He is extremely drowsy on my interview and was drowsy on admission, this is likely due to the polypharmacy as she is overmedicated in the setting of acute kidney injury.  At home she is on oxycodone, Abilify, Wellbutrin, Lexapro, hydroxyzine and trazodone. Some of this medications will have to be stopped I think she should go home on the psychotropic medication and lower dose of narcotics.   Continue IV fluid resuscitation.  Cardiac biomarkers have basically remained flat. She was also transfused 1 unit of packed red blood cells Cortisol level pending. Continue IV fluid hydration.  We will stop narcotics for today.  Paroxysmal atrial fibrillation: In sinus bradycardia metoprolol has been held along with Coumadin. INR is 1.3.  She  Acute kidney injury: With a baseline creatinine of around 1, on admission 3.1. Started fluid resuscitation and packed red blood cells transfusion. Basic metabolic panel is pending this morning.  Normocytic anemia/with a recent GI bleed: Hemoglobin of around 10  Recent mitral valve and tricuspid valve repair secondary to rheumatic valve disease: With a history of undergoing pericardiocentesis due to cardiac  tamponade earlier this year. TEE done last week which was unremarkable.  Diabetes mellitus type 2: Blood glucose well controlled continue current regimen.  Chronic pain: Appears drowsy and renal failure question of polypharmacy the cause of her pharmacy   DVT prophylaxis: lovenox Family Communication:none Status is: Observation The patient remains OBS appropriate and will d/c before 2 midnights.    Code Status:     Code Status Orders  (From admission, onward)           Start     Ordered   08/03/22 0349  Full code  Continuous        08/03/22 0350           Code Status History     Date Active Date Inactive Code Status Order ID Comments User Context   07/26/2022 1615 07/31/2022 2012 Full Code [id number]  Karmen Bongo, MD ED   07/22/2022 2239 07/24/2022 2128 Full Code [id number]  Flora Lipps, MD ED   05/29/2022 0118 05/30/2022 2032 Full Code [id number]  Vianne Bulls, MD ED   05/22/2022 2116 05/26/2022 1839 Full Code [id number]  Kayleen Memos, DO ED   05/10/2022 0832 05/17/2022 1910 Full Code [id number]  Jettie Booze, MD Inpatient   04/19/2022 1340 04/29/2022 0203 Full Code [id number]  Nani Skillern, PA-C Inpatient   02/12/2022 1159 02/12/2022 1928 Full Code [id number]  Belva Crome, MD Inpatient         IV Access:   Peripheral IV   Procedures and diagnostic studies:   DG CHEST PORT 1 VIEW  Result Date: 08/03/2022 CLINICAL DATA:  Shortness of breath EXAM: PORTABLE CHEST 1 VIEW COMPARISON:  Eight days ago FINDINGS: Interstitial opacity with some  Kerley lines. No visible effusion. Cardiopericardial enlargement. Prior median sternotomy for AV valve repairs. IMPRESSION: CHF pattern. Electronically Signed   By: Jorje Guild M.D.   On: 08/03/2022 06:40   DG Knee 1-2 Views Left  Result Date: 08/03/2022 CLINICAL DATA:  Syncope and fall EXAM: LEFT KNEE - 2 VIEW COMPARISON:  05/28/2022 FINDINGS: No evidence of fracture, dislocation, or joint effusion. Joint  space narrowing with mild spurring at the medial compartment. IMPRESSION: No acute finding. 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 Electronically Signed   By: Jorje Guild M.D.   On: 08/03/2022 04:16   DG Knee 1-2 Views Right  Result Date: 08/03/2022 CLINICAL DATA:  Syncope and fall EXAM: RIGHT KNEE - 2 VIEW COMPARISON:  None Available. FINDINGS: No evidence of fracture, dislocation, or joint effusion. Minimal marginal spurring at the medial compartment. IMPRESSION: No acute finding. Electronically Signed   By: Jorje Guild M.D.   On: 08/03/2022 04:15     Medical Consultants:   None.   Subjective:    [name]'s Sleepy and drowsy on my interview  Objective:    Vitals:   08/03/22 0515 08/03/22 0530 08/03/22 0545 08/03/22 0600  BP: 90/64 (!) 88/67 (!) 85/61 (!) 89/64  Pulse: (!) 44 (!) 43 (!) 44 (!) 47  Resp: (!) 8 (!) 9 (!) 8 11  Temp:      TempSrc:      SpO2: 92% 91% 92% 95%  Weight:      Height:       SpO2: 95 % O2 Flow Rate (L/min): 4 L/min   Intake/Output Summary (Last 24 hours) at 08/03/2022 0700 Last data filed at 08/03/2022 0607 Gross per 24 hour  Intake 2000 ml  Output --  Net 2000 ml   Filed Weights   08/03/22 0345  Weight: 91.8 kg    Exam: General exam: In no acute distress. Respiratory system: Good air movement and clear to auscultation. Cardiovascular system: S1 & S2 heard, RRR. No JVD. Gastrointestinal system: Abdomen is nondistended, soft and nontender.  Extremities: No pedal edema. Skin: No rashes, lesions or ulcers Psychiatry: Very sleepy and drowsy hard to wake up   Data Reviewed:    Labs: Basic Metabolic Panel: Recent Labs  Lab 07/28/22 1023 07/29/22 0451 07/30/22 0552 07/31/22 0633 08/03/22 0047  NA 138 138 139 135 133*  K 4.3 4.2 4.2 4.4 4.1  CL 108 107 110 106 102  CO2 22 24 21* 19* 19*  GLUCOSE 88 91 94 106* 84  BUN '10 11 11 12 '$ 26*  CREATININE 0.93 1.13* 1.12* 1.19* 3.14*  CALCIUM 8.5* 8.2* 8.6* 8.6* 7.3*    GFR Estimated Creatinine Clearance: 23.2 mL/min (A) (by C-G formula based on SCr of 3.14 mg/dL (H)). Liver Function Tests: Recent Labs  Lab 07/28/22 1023 07/29/22 0451 07/30/22 0552 07/31/22 0633 08/03/22 0047  AST 29 20 34 43* 21  ALT '18 16 16 18 12  '$ ALKPHOS 76 65 71 75 57  BILITOT 0.7 0.3 0.8 1.0 0.6  PROT 6.4* 5.7* 6.5 7.0 5.9*  ALBUMIN 3.1* 2.8* 3.2* 3.6 3.3*   No results for input(s): "LIPASE", "AMYLASE" in the last 168 hours. No results for input(s): "AMMONIA" in the last 168 hours. Coagulation profile Recent Labs  Lab 07/28/22 1537 07/29/22 0451 07/30/22 0552 07/31/22 0633 08/03/22 0047  INR 1.3* 1.3* 1.2 1.2 1.3*   COVID-19 Labs  No results for input(s): "DDIMER", "FERRITIN", "LDH", "CRP" in the last 72 hours.  Lab Results  Component Value Date  [place] NEGATIVE 05/28/2022   SARSCOV2NAA NEGATIVE 04/15/2022   SARSCOV2NAA NEGATIVE 12/08/2021   [place] NEGATIVE 08/26/2021    CBC: Recent Labs  Lab 07/28/22 1023 07/29/22 0451 07/30/22 0552 07/31/22 0633 08/03/22 0047 08/03/22 0339  WBC 7.2 5.8 7.8 9.9 6.0  --   NEUTROABS  --   --   --   --  3.0  --   HGB 11.1* 10.0* 11.1* 11.9* 9.2* 10.0*  HCT 33.0* 29.8* 33.2* 35.3* 29.6* 31.9*  MCV 84.4 85.4 86.5 85.7 92.8  --   PLT 228 252 274 313 223  --    Cardiac Enzymes: No results for input(s): "CKTOTAL", "CKMB", "CKMBINDEX", "TROPONINI" in the last 168 hours. BNP (last 3 results) No results for input(s): "PROBNP" in the last 8760 hours. CBG: Recent Labs  Lab 07/30/22 1246 07/30/22 1616 07/30/22 2102 07/31/22 0625 07/31/22 1130  GLUCAP 83 81 77 119* 95   D-Dimer: No results for input(s): "DDIMER" in the last 72 hours. Hgb A1c: No results for input(s): "HGBA1C" in the last 72 hours. Lipid Profile: No results for input(s): "CHOL", "HDL", "LDLCALC", "TRIG", "CHOLHDL", "LDLDIRECT" in the last 72 hours. Thyroid function studies: No results for input(s): "TSH", "T4TOTAL", "T3FREE",  "THYROIDAB" in the last 72 hours.  Invalid input(s): "FREET3" Anemia work up: No results for input(s): "VITAMINB12", "FOLATE", "FERRITIN", "TIBC", "IRON", "RETICCTPCT" in the last 72 hours. Sepsis Labs: Recent Labs  Lab 07/29/22 0451 07/30/22 0552 07/31/22 7591 08/03/22 0047 08/03/22 0339  WBC 5.8 7.8 9.9 6.0  --   LATICACIDVEN  --   --   --   --  1.1   Microbiology No results found for this or any previous visit (from the past 240 hour(s)).   Medications:    insulin aspart  0-6 Units Subcutaneous TID WC   warfarin  5 mg Oral ONCE-1600   Warfarin - Pharmacist Dosing Inpatient   Does not apply q1600   Continuous Infusions:  lactated ringers 100 mL/hr at 08/03/22 0558      LOS: 0 days   Charlynne Cousins  [facility]  08/03/2022, 7:00 AM

## 2022-08-03 NOTE — ED Notes (Signed)
X-ray at bedside at this time.

## 2022-08-03 NOTE — ED Notes (Signed)
Admit provider made aware that the pt is requesting something for pain. Per admit provider he will order iv tylenol. Pt made aware of this and is currently refusing stating that it doesn't work for her and she isn't going to take meds just to be taking meds.

## 2022-08-03 NOTE — ED Notes (Signed)
Radiology at bedside at this time.

## 2022-08-04 ENCOUNTER — Observation Stay (HOSPITAL_COMMUNITY): Payer: Medicare Other

## 2022-08-04 DIAGNOSIS — R55 Syncope and collapse: Secondary | ICD-10-CM | POA: Diagnosis not present

## 2022-08-04 DIAGNOSIS — D649 Anemia, unspecified: Secondary | ICD-10-CM

## 2022-08-04 DIAGNOSIS — N179 Acute kidney failure, unspecified: Secondary | ICD-10-CM | POA: Diagnosis not present

## 2022-08-04 DIAGNOSIS — I959 Hypotension, unspecified: Secondary | ICD-10-CM | POA: Diagnosis not present

## 2022-08-04 DIAGNOSIS — G4700 Insomnia, unspecified: Secondary | ICD-10-CM

## 2022-08-04 DIAGNOSIS — R001 Bradycardia, unspecified: Secondary | ICD-10-CM

## 2022-08-04 LAB — BASIC METABOLIC PANEL
Anion gap: 8 (ref 5–15)
BUN: 19 mg/dL (ref 8–23)
CO2: 23 mmol/L (ref 22–32)
Calcium: 7.6 mg/dL — ABNORMAL LOW (ref 8.9–10.3)
Chloride: 104 mmol/L (ref 98–111)
Creatinine, Ser: 1.79 mg/dL — ABNORMAL HIGH (ref 0.44–1.00)
GFR, Estimated: 32 mL/min — ABNORMAL LOW (ref >60)
Glucose, Bld: 113 mg/dL — ABNORMAL HIGH (ref 70–99)
Potassium: 4.4 mmol/L (ref 3.5–5.1)
Sodium: 135 mmol/L (ref 135–145)

## 2022-08-04 LAB — PROTIME-INR
INR: 1.4 — ABNORMAL HIGH (ref 0.8–1.2)
Prothrombin Time: 17.4 seconds — ABNORMAL HIGH (ref 11.4–15.2)

## 2022-08-04 LAB — CBC
HCT: 29 % — ABNORMAL LOW (ref 36.0–46.0)
Hemoglobin: 9.3 g/dL — ABNORMAL LOW (ref 12.0–15.0)
MCH: 29.1 pg (ref 26.0–34.0)
MCHC: 32.1 g/dL (ref 30.0–36.0)
MCV: 90.6 fL (ref 80.0–100.0)
Platelets: 203 10*3/uL (ref 150–400)
RBC: 3.2 MIL/uL — ABNORMAL LOW (ref 3.87–5.11)
RDW: 16.6 % — ABNORMAL HIGH (ref 11.5–15.5)
WBC: 5.6 10*3/uL (ref 4.0–10.5)
nRBC: 0.4 % — ABNORMAL HIGH (ref 0.0–0.2)

## 2022-08-04 LAB — GLUCOSE, CAPILLARY
Glucose-Capillary: 107 mg/dL — ABNORMAL HIGH (ref 70–99)
Glucose-Capillary: 109 mg/dL — ABNORMAL HIGH (ref 70–99)
Glucose-Capillary: 85 mg/dL (ref 70–99)

## 2022-08-04 LAB — CORTISOL: Cortisol, Plasma: 11.6 ug/dL

## 2022-08-04 MED ORDER — METOPROLOL TARTRATE 50 MG PO TABS: 25.0000 mg | ORAL_TABLET | Freq: Two times a day (BID) | ORAL | 6 refills | Status: DC

## 2022-08-04 MED ORDER — LOSARTAN POTASSIUM 100 MG PO TABS: 100.0000 mg | ORAL_TABLET | Freq: Every day | ORAL | Status: DC

## 2022-08-04 MED ORDER — FUROSEMIDE 20 MG PO TABS: 20.0000 mg | ORAL_TABLET | Freq: Every day | ORAL | 2 refills | Status: DC

## 2022-08-04 MED ORDER — BUPROPION HCL ER (XL) 150 MG PO TB24: 300.0000 mg | ORAL_TABLET | Freq: Every morning | ORAL | Status: DC

## 2022-08-04 MED ORDER — TRAZODONE HCL 100 MG PO TABS: 100.0000 mg | ORAL_TABLET | Freq: Every evening | ORAL | 0 refills | Status: DC | PRN

## 2022-08-04 MED ORDER — ARIPIPRAZOLE 5 MG PO TABS
5.0000 mg | ORAL_TABLET | Freq: Every day | ORAL | Status: DC
  Administered 2022-08-04: 5 mg via ORAL
  Filled 2022-08-04: qty 1

## 2022-08-04 MED ORDER — METOPROLOL TARTRATE 25 MG PO TABS
25.0000 mg | ORAL_TABLET | Freq: Two times a day (BID) | ORAL | Status: DC
  Filled 2022-08-04: qty 1

## 2022-08-04 MED ORDER — WARFARIN SODIUM 5 MG PO TABS
5.0000 mg | ORAL_TABLET | Freq: Once | ORAL | Status: AC
  Administered 2022-08-04: 5 mg via ORAL
  Filled 2022-08-04: qty 1

## 2022-08-04 MED ORDER — OXYCODONE HCL 15 MG PO TABS: 7.5000 mg | ORAL_TABLET | Freq: Two times a day (BID) | ORAL | 0 refills | Status: DC | PRN

## 2022-08-04 MED ORDER — OXYCODONE HCL 5 MG PO TABS
5.0000 mg | ORAL_TABLET | Freq: Once | ORAL | Status: AC
  Administered 2022-08-04: 5 mg via ORAL
  Filled 2022-08-04: qty 1

## 2022-08-04 MED ORDER — LINAGLIPTIN 5 MG PO TABS: 5.0000 mg | ORAL_TABLET | Freq: Every morning | ORAL | Status: DC

## 2022-08-04 MED ORDER — ATORVASTATIN CALCIUM 10 MG PO TABS
20.0000 mg | ORAL_TABLET | Freq: Every day | ORAL | Status: DC
  Administered 2022-08-04: 20 mg via ORAL
  Filled 2022-08-04: qty 2

## 2022-08-04 NOTE — TOC Transition Note (Addendum)
Transition of Care [facility]) - CM/SW Discharge Note   Patient Details  Name: [name] MRN: [id number] Date of Birth: 1961/06/29  Transition of Care [facility]) CM/SW Contact:  Zenon Mayo, RN Phone Number: 08/04/2022, 3:19 PM   Clinical Narrative:    Patient is for dc today, she is set up with [facility] for [facility],  [place] notified Tommi Rumps with [facility] of dc today.  She will also need home oxygen per doctor 2 liters, NCM asked her if she has a preference of where she gets her DME she states no, she is ok with [employer] supplying this for her.  NCM made referral thru [facility] for home oxygen 2 liters.  A tank will be brought up to her room and the concentrator will be delivered to her home.    NCM received call from [facility] with [facility], he is stating that his director states they can not take patient back because they tried to get in touch with her before and she did not answer. Patient states she is ok with [facility], NCM made referral to [facility] with [facility] , she states she can take referral.  Soc will begin after the holiday of Sept 4, patient states she is ok with that. Her sister is coming to pick her up.   Final next level of care: [place] Barriers to Discharge: No Barriers Identified   Patient Goals and CMS Choice Patient states their goals for this hospitalization and ongoing recovery are:: return home with [facility] CMS Medicare.gov Compare Post Acute Care list provided to:: Patient Choice offered to / list presented to : Patient  Discharge Placement                       Discharge Plan and Services                DME Arranged: Oxygen DME Agency: [facility] Date DME Agency Contacted: 08/04/22 Time DME Agency Contacted: [phone] Representative spoke with at DME Agency: Thedore Mins HH Arranged: RN, Disease Management [place] Agency: [place] Date [place]: 08/04/22 Time [place]: [id number] Representative spoke with at [place]:  Tommi Rumps  Social Determinants of Health ([place]) Interventions     Readmission Risk Interventions     No data to display

## 2022-08-04 NOTE — Progress Notes (Signed)
ANTICOAGULATION CONSULT NOTE -follow up  Pharmacy Consult for Warfarin  Indication: atrial fibrillation   Allergies  Allergen Reactions   Norco [Hydrocodone-Acetaminophen] Itching and Other (See Comments)    Tolerates Oxycodone    Patient Measurements: Height: '5\' 11"'$  (180.3 cm) Weight: 98.1 kg (216 lb 3.2 oz) IBW/kg (Calculated) : 70.8 Vital Signs: Temp: 98.4 F (36.9 C) (08/30 0747) Temp Source: Oral (08/30 0747) BP: 123/64 (08/30 0747) Pulse Rate: 89 (08/30 0747)  Labs: Recent Labs    08/03/22 0047 08/03/22 0339 08/03/22 0713 08/04/22 0658 08/04/22 1116  HGB 9.2* 10.0* 9.1*  --  9.3*  HCT 29.6* 31.9* 30.9*  --  29.0*  PLT 223  --  217  --  203  LABPROT 15.8*  --   --  17.4*  --   INR 1.3*  --   --  1.4*  --   CREATININE 3.14*  --   --   --  1.79*  TROPONINIHS 62* 49*  --   --   --      Estimated Creatinine Clearance: 42 mL/min (A) (by C-G formula based on SCr of 1.79 mg/dL (H)).   Medical History: Past Medical History:  Diagnosis Date   Acute pericardial effusion 05/17/2022   AKI (acute kidney injury) ([place]) 05/17/2022   Anxiety    Arthritis    Cervicalgia    Chondromalacia of patella    Chronic neck pain    Chronic pain syndrome    Depression    secondary to loss of her son at age 39   Diabetes mellitus    Diabetic neuropathy ([place])    DMII (diabetes mellitus, type 2) ([place])    Dysrhythmia    Enthesopathy of hip region    Fibromyalgia    GERD (gastroesophageal reflux disease)    Hep C w/o coma, chronic ([place])    Hepatitis C    diagnosed 2005   Hypertension    Insomnia    Insomnia    Low back pain    Lumbago    Mitral regurgitation    Obesity    Pain in joint, upper arm    Primary localized osteoarthrosis, lower leg    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 Plan was to resume warfarin on 08/02/22 , 3 days after colonoscopy per GI instruction post colonoscopy  INR on admission 08/03/22 is low at 1.3. Noted pt in acute renal failure.  Hgb 10 on admit. No current GI bleeding noted.  Today INR is 1.4,  Hg stable at 9.3, pltc wnl /stable at 203k.  No bleeding reported.   PTA warfarin dose is '5mg'$  daily except 7.5 mg every Sunday.    Goal:  INR 2-3 Monitor platelets by anticoagulation protocol: Yes   Plan:  Warfarin 5 mg PO x 1 at 1600 today  Daily PT/INR Monitor closely for any GI bleeding  Nicole Cella, RPh Clinical Pharmacist [phone] 08/04/2022 12:42 PM Please check AMION for all [place] phone numbers After 10:00 PM, call [place] [phone]

## 2022-08-04 NOTE — Care Management Obs Status (Signed)
[place] NOTIFICATION   Patient Details  Name: [name] MRN: [id number] Date of Birth: [birth year]   Medicare Observation Status Notification Given:  Yes    Zenon Mayo, RN 08/04/2022, 10:41 AM

## 2022-08-04 NOTE — TOC Progression Note (Addendum)
Transition of Care [facility]) - Progression Note    Patient Details  Name: [name] MRN: [id number] Date of Birth: Jan 24, 1961  Transition of Care [facility]) CM/SW Contact  Zenon Mayo, RN Phone Number: 08/04/2022, 11:01 AM  Clinical Narrative:    from home alone, LOC syncope, has a lot of meds, needs [facility] for med management, she has rollator, w/chair and a cane at home, patient states she gets confused sometimes when starting on new medications. NCM offered choice to patient  with [email or web address], she states she had [facility] and would like to continue with them just for [facility], she does not want therapy.  NCM informed MD of this information. NCM made rerferral to [facility] with Alvis Lemmings, he is able to take referral for [facility]  Soc will begin 24 to 48 hrs post dc.  TOC following.        Expected Discharge Plan and Services                                                 Social Determinants of Health (SDOH) Interventions    Readmission Risk Interventions     No data to display

## 2022-08-04 NOTE — Hospital Course (Signed)
Taken from H&P.  [name] is a 61 y.o. female with history of paroxysmal atrial fibrillation, diabetes mellitus, hypertension, mitral valve and tricuspid valve repair subsequent to which patient also had cardiac tamponade early part of this year had underwent pericardiocentesis recently admitted last week for GI bleed had colon polyps removed and also had angiodysplasia and APC presents to the ER with having lost consciousness.  Patient states after discharge about 3 days ago patient has been getting dizzy mostly on trying to exert.  Denies any chest pain productive cough nausea vomiting or diarrhea has not noticed any blood in the stools.  She states she was talking to her friends in the yard when she felt dizzy and passed out.   ED Course: In the ER patient was hypotensive with blood pressure systolic in the 79G.  Hemoglobin has dropped by 3 g but repeat hemoglobin has been stable at around 10.  Blood pressure improved with fluids EKG shows sinus bradycardia.  Patient admitted for further observation. She was also found to have AKI which started improving pretty quickly with IV fluids. CT head was without any acute abnormality. Initial cortisol levels were little low at 6.5 and improved to 11.5 on recheck.  Hemoglobin remained stable.  No other obvious bleeding.  INR subtherapeutic.  Patient most likely was very dehydrated as she admitted not drinking much.  She continue to take her home medications which include Lasix, spironolactone, losartan and metoprolol. We held all of those medications and her renal function started improving.  Blood pressure remained within goal.  Mild tachycardia with heart rate trending up and then increased to low 90s we restarted metoprolol at a lower dose of 25 mg twice daily.  Patient need to hold her home dose of Lasix and losartan until she sees her primary care provider and have her kidney functions checked before resuming.  Primary care provider can restart at  appropriate doses. She was also told to hold Tradjenta for the next few days, urine functions improving and creatinine was at 1.79 on discharge.  She was using 3 tablets of oxycodone, prescribed at 7.5 mg but she was taking 15 mg and insist of getting the same amount here.  We decreased the frequency of oxycodone from every 8 hourly to every 12 hourly as needed and she was advised to only take 7.5 mg as prescribed.  She was on multiple psych medications which can be contributory.  She need to discuss with her psychiatrist for a good med rec and see if they can wean her off.  She was also taking very high doses of trazodone to sleep, dose decreased to 100 mg from 300 mg, she should be using it as as needed only.  Most likely her polypharmacy which include multiple sedating medications and continue to take her diuretics and losartan can be responsible for this presentation and AKI.  If home health nursing staff was ordered to manage her medications.  Patient should continue on current medications with the changes as mentioned above and need to have a very close follow-up with primary care provider for further management.

## 2022-08-04 NOTE — Progress Notes (Signed)
   08/04/22 1215  Mobility  Activity Ambulated with assistance in hallway  Level of Assistance Standby assist, set-up cues, supervision of patient - no hands on  Assistive Device Front wheel walker  Distance Ambulated (ft) 400 ft  Activity Response Tolerated well  $Mobility charge 1 Mobility   Mobility Specialist Progress Note  Pt was in bed and agreeable. Took X1 seated break d/t lightheadedness but recovered. Returned back to bed with call bell in reach and alarm on  Elisha [place]

## 2022-08-04 NOTE — Plan of Care (Signed)
  Problem: Clinical Measurements: Goal: Ability to maintain clinical measurements within normal limits will improve Outcome: Progressing   Problem: Education: Goal: Knowledge of General Education information will improve Description: Including pain rating scale, medication(s)/side effects and non-pharmacologic comfort measures Outcome: Progressing   Problem: Clinical Measurements: Goal: Respiratory complications will improve Outcome: Progressing

## 2022-08-04 NOTE — Progress Notes (Signed)
EEG complete - results pending 

## 2022-08-04 NOTE — Discharge Instructions (Signed)
Information on my medicine - Coumadin   (Warfarin)  You were taking this medication prior to this hospital admission.    Why was Coumadin prescribed for you? Coumadin was prescribed for you because you have a blood clot or a medical condition that can cause an increased risk of forming blood clots. Blood clots can cause serious health problems by blocking the flow of blood to the heart, lung, or brain. Coumadin can prevent harmful blood clots from forming. As a reminder your indication for Coumadin is:  Stroke Prevention because of Atrial Fibrillation  What test will check on my response to Coumadin? While on Coumadin (warfarin) you will need to have an INR test regularly to ensure that your dose is keeping you in the desired range. The INR (international normalized ratio) number is calculated from the result of the laboratory test called prothrombin time (PT).  If an INR APPOINTMENT HAS NOT ALREADY BEEN MADE FOR YOU please schedule an appointment to have this lab work done by your health care provider within 7 days. Your INR goal is usually a number between:  2 to 3 or your provider may give you a more narrow range like 2-2.5.  Ask your health care provider during an office visit what your goal INR is.  What  do you need to  know  About  COUMADIN? Take Coumadin (warfarin) exactly as prescribed by your healthcare provider about the same time each day.  DO NOT stop taking without talking to the doctor who prescribed the medication.  Stopping without other blood clot prevention medication to take the place of Coumadin may increase your risk of developing a new clot or stroke.  Get refills before you run out.  What do you do if you miss a dose? If you miss a dose, take it as soon as you remember on the same day then continue your regularly scheduled regimen the next day.  Do not take two doses of Coumadin at the same time.  Important Safety Information A possible side effect of Coumadin  (Warfarin) is an increased risk of bleeding. You should call your healthcare provider right away if you experience any of the following: Bleeding from an injury or your nose that does not stop. Unusual colored urine (red or dark brown) or unusual colored stools (red or black). Unusual bruising for unknown reasons. A serious fall or if you hit your head (even if there is no bleeding).  Some foods or medicines interact with Coumadin (warfarin) and might alter your response to warfarin. To help avoid this: Eat a balanced diet, maintaining a consistent amount of Vitamin K. Notify your provider about major diet changes you plan to make. Avoid alcohol or limit your intake to 1 drink for women and 2 drinks for men per day. (1 drink is 5 oz. wine, 12 oz. beer, or 1.5 oz. liquor.)  Make sure that ANY health care provider who prescribes medication for you knows that you are taking Coumadin (warfarin).  Also make sure the healthcare provider who is monitoring your Coumadin knows when you have started a new medication including herbals and non-prescription products.  Coumadin (Warfarin)  Major Drug Interactions  Increased Warfarin Effect Decreased Warfarin Effect  Alcohol (large quantities) Antibiotics (esp. Septra/Bactrim, Flagyl, Cipro) Amiodarone (Cordarone) Aspirin (ASA) Cimetidine (Tagamet) Megestrol (Megace) NSAIDs (ibuprofen, naproxen, etc.) Piroxicam (Feldene) Propafenone (Rythmol SR) Propranolol (Inderal) Isoniazid (INH) Posaconazole (Noxafil) Barbiturates (Phenobarbital) Carbamazepine (Tegretol) Chlordiazepoxide (Librium) Cholestyramine (Questran) Griseofulvin Oral Contraceptives Rifampin Sucralfate (Carafate) Vitamin K     Coumadin (Warfarin) Major Herbal Interactions  Increased Warfarin Effect Decreased Warfarin Effect  Garlic Ginseng Ginkgo biloba Coenzyme Q10 Green tea St. John's wort    Coumadin (Warfarin) FOOD Interactions  Eat a consistent number of servings per  week of foods HIGH in Vitamin K (1 serving =  cup)  Collards (cooked, or boiled & drained) Kale (cooked, or boiled & drained) Mustard greens (cooked, or boiled & drained) Parsley *serving size only =  cup Spinach (cooked, or boiled & drained) Swiss chard (cooked, or boiled & drained) Turnip greens (cooked, or boiled & drained)  Eat a consistent number of servings per week of foods MEDIUM-HIGH in Vitamin K (1 serving = 1 cup)  Asparagus (cooked, or boiled & drained) Broccoli (cooked, boiled & drained, or raw & chopped) Brussel sprouts (cooked, or boiled & drained) *serving size only =  cup Lettuce, raw (green leaf, endive, romaine) Spinach, raw Turnip greens, raw & chopped   These websites have more information on Coumadin (warfarin):  www.coumadin.com; www.ahrq.gov/consumer/coumadin.htm;   

## 2022-08-04 NOTE — Progress Notes (Signed)
SATURATION QUALIFICATIONS: (This note is used to comply with regulatory documentation for home oxygen)  Patient Saturations on Room Air at Rest = 95%  Patient Saturations on Room Air while Ambulating = 85%  Patient Saturations on 2 Liters of oxygen while Ambulating = 98%  Please briefly explain why patient needs home oxygen:

## 2022-08-04 NOTE — Discharge Summary (Signed)
Physician Discharge Summary   Patient: [name] MRN: [id number] DOB: [birth year]  Admit date:     08/02/2022  Discharge date: 08/04/22  Discharge Physician: Lorella Nimrod   PCP: Pcp, No   Recommendations at discharge:  Please obtain CBC and BMP in 3 days. Please check INR on Friday. Follow-up with primary care provider within next few days.  We are holding her home losartan, Lasix, Tradjenta and decreasing the dose of metoprolol, please restart if appropriate. Patient need a close follow-up with her psychiatrist to manage psych meds. Please try weaning her off from narcotics.  Discharge Diagnoses: Principal Problem:   Syncope Active Problems:   Bradycardia   Acute kidney injury (nontraumatic) (HCC)   Type 2 diabetes mellitus without complication, without long-term current use of insulin (HCC)   Paroxysmal atrial fibrillation (HCC)   Anemia, blood loss   Chronic systolic CHF (congestive heart failure) [facility])   Hospital Course: Taken from H&P.  [name] is a 61 y.o. female with history of paroxysmal atrial fibrillation, diabetes mellitus, hypertension, mitral valve and tricuspid valve repair subsequent to which patient also had cardiac tamponade early part of this year had underwent pericardiocentesis recently admitted last week for GI bleed had colon polyps removed and also had angiodysplasia and APC presents to the ER with having lost consciousness.  Patient states after discharge about 3 days ago patient has been getting dizzy mostly on trying to exert.  Denies any chest pain productive cough nausea vomiting or diarrhea has not noticed any blood in the stools.  She states she was talking to her friends in the yard when she felt dizzy and passed out.   ED Course: In the ER patient was hypotensive with blood pressure systolic in the 76O.  Hemoglobin has dropped by 3 g but repeat hemoglobin has been stable at around 10.  Blood pressure improved with fluids EKG shows sinus  bradycardia.  Patient admitted for further observation. She was also found to have AKI which started improving pretty quickly with IV fluids. CT head was without any acute abnormality. Initial cortisol levels were little low at 6.5 and improved to 11.5 on recheck.  Hemoglobin remained stable.  No other obvious bleeding.  INR subtherapeutic.  Patient most likely was very dehydrated as she admitted not drinking much.  She continue to take her home medications which include Lasix, spironolactone, losartan and metoprolol. We held all of those medications and her renal function started improving.  Blood pressure remained within goal.  Mild tachycardia with heart rate trending up and then increased to low 90s we restarted metoprolol at a lower dose of 25 mg twice daily.  Patient need to hold her home dose of Lasix and losartan until she sees her primary care provider and have her kidney functions checked before resuming.  Primary care provider can restart at appropriate doses. She was also told to hold Tradjenta for the next few days, urine functions improving and creatinine was at 1.79 on discharge.  She was using 3 tablets of oxycodone, prescribed at 7.5 mg but she was taking 15 mg and insist of getting the same amount here.  We decreased the frequency of oxycodone from every 8 hourly to every 12 hourly as needed and she was advised to only take 7.5 mg as prescribed.  She was on multiple psych medications which can be contributory.  She need to discuss with her psychiatrist for a good med rec and see if they can wean her off.  She was  also taking very high doses of trazodone to sleep, dose decreased to 100 mg from 300 mg, she should be using it as as needed only.  Most likely her polypharmacy which include multiple sedating medications and continue to take her diuretics and losartan can be responsible for this presentation and AKI.  If home health nursing staff was ordered to manage her  medications.  Patient should continue on current medications with the changes as mentioned above and need to have a very close follow-up with primary care provider for further management.   Pain control - [employer] Controlled Substance Reporting System database was reviewed. and patient was instructed, not to drive, operate heavy machinery, perform activities at heights, swimming or participation in water activities or provide baby-sitting services while on Pain, Sleep and Anxiety Medications; until their outpatient Physician has advised to do so again. Also recommended to not to take more than prescribed Pain, Sleep and Anxiety Medications.  Consultants: None Procedures performed: None Disposition: Home Diet recommendation:  Discharge Diet Orders (From admission, onward)     Start     Ordered   08/04/22 0000  Diet - low sodium heart healthy        08/04/22 1418           Cardiac and Carb modified diet DISCHARGE MEDICATION: Allergies as of 08/04/2022       Reactions   Norco [hydrocodone-acetaminophen] Itching, Other (See Comments)   Tolerates Oxycodone        Medication List     STOP taking these medications    hydrOXYzine 25 MG tablet Commonly known as: ATARAX   methocarbamol 500 MG tablet Commonly known as: ROBAXIN   omeprazole 10 MG capsule Commonly known as: PRILOSEC       TAKE these medications    acetaminophen 500 MG tablet Commonly known as: TYLENOL Take 1,000 mg by mouth every 6 (six) hours as needed for mild pain.   albuterol 108 (90 Base) MCG/ACT inhaler Commonly known as: VENTOLIN HFA Inhale 2 puffs into the lungs every 6 (six) hours as needed for wheezing or shortness of breath.   ARIPiprazole 5 MG tablet Commonly known as: ABILIFY Take 5 mg by mouth daily.   atorvastatin 20 MG tablet Commonly known as: LIPITOR Take 20 mg by mouth daily.   buPROPion 300 MG 24 hr tablet Commonly known as: WELLBUTRIN XL Take 300 mg by mouth in the  morning.   dexlansoprazole 60 MG capsule Commonly known as: DEXILANT Take 1 capsule by mouth daily.   escitalopram 10 MG tablet Commonly known as: LEXAPRO Take 10 mg by mouth every morning.   furosemide 20 MG tablet Commonly known as: LASIX Take 1 tablet (20 mg total) by mouth daily. Hold until you see your physician What changed: additional instructions   gabapentin 600 MG tablet Commonly known as: NEURONTIN Take 0.5 tablets (300 mg total) by mouth 3 (three) times daily.   linagliptin 5 MG Tabs tablet Commonly known as: TRADJENTA Take 1 tablet (5 mg total) by mouth in the morning. Hold until you see your physician What changed: additional instructions   losartan 100 MG tablet Commonly known as: COZAAR Take 1 tablet (100 mg total) by mouth daily. Hold until you see your physician What changed: additional instructions   metoprolol tartrate 50 MG tablet Commonly known as: LOPRESSOR Take 0.5 tablets (25 mg total) by mouth 2 (two) times daily. What changed: how much to take   naloxone 4 MG/0.1ML Liqd nasal spray kit Commonly  known as: NARCAN Place 1 spray into the nose as needed (accidental overdose).   oxyCODONE 15 MG immediate release tablet Commonly known as: ROXICODONE Take 0.5 tablets (7.5 mg total) by mouth every 12 (twelve) hours as needed for pain. What changed: when to take this   Premarin 0.45 MG tablet Generic drug: estrogens (conjugated) Take 0.45 mg by mouth daily.   spironolactone 25 MG tablet Commonly known as: ALDACTONE Take 12.5 mg by mouth daily.   traZODone 100 MG tablet Commonly known as: DESYREL Take 1 tablet (100 mg total) by mouth at bedtime as needed for sleep. What changed: how much to take   warfarin 5 MG tablet Commonly known as: COUMADIN Take as directed. If you are unsure how to take this medication, talk to your nurse or doctor. Original instructions: TAKE 1 TABLET BY MOUTH DAILY OR AS DIRECTED BY ANTICOAGULATION CLINIC. What  changed: See the new instructions.        Follow-up Information     [facility] Follow up.   Specialty: Home Health Services Why: Agency will call you to set up apt times. Contact information: [place] [address] [phone]         Werner Lean, MD. Schedule an appointment as soon as possible for a visit in 1 week(s).   Specialty: Cardiology Contact information: [place] [address] [phone]                Discharge Exam: Danley Danker Weights   08/03/22 0345 08/03/22 1522 2022-08-30 0000  Weight: 91.8 kg 90.1 kg 98.1 kg   General.  Frail lady, in no acute distress. Pulmonary.  Lungs clear bilaterally, normal respiratory effort. CV.  Regular rate and rhythm, no JVD, rub or murmur. Abdomen.  Soft, nontender, nondistended, BS positive. CNS.  Alert and oriented .  No focal neurologic deficit. Extremities.  No edema, no cyanosis, pulses intact and symmetrical. Psychiatry.  Judgment and insight appears normal.   Condition at discharge: stable  The results of significant diagnostics from this hospitalization (including imaging, microbiology, ancillary and laboratory) are listed below for reference.   Imaging Studies: EEG adult  Result Date: 08/30/22 Lora Havens, MD     Aug 30, 2022 10:34 AM Patient Name: Davida Falconi MRN: [id number] Epilepsy Attending: Lora Havens Referring Physician/Provider: Charlynne Cousins Date: 08-30-2022 Duration: 24.37 mins Patient history: 61 year old female with syncope.  EEG to evaluate for seizure. Level of alertness: Awake AEDs during EEG study: None Technical aspects: This EEG study was done with scalp electrodes positioned according to the 10-20 International system of electrode placement. Electrical activity was reviewed with band pass filter of 1-$RemoveBef'70Hz'KodWDmgATK$ , sensitivity of 7 uV/mm, display speed of 31mm/sec with a $Remo'60Hz'MwaQw$  notched filter applied as  appropriate. EEG data were recorded continuously and digitally stored.  Video monitoring was available and reviewed as appropriate. Description: The posterior dominant rhythm consists of 8-9 Hz activity of moderate voltage (25-35 uV) seen predominantly in posterior head regions, symmetric and reactive to eye opening and eye closing. Physiologic photic driving was not seen during photic stimulation.  Hyperventilation was not performed.   IMPRESSION: This study is within normal limits. No seizures or epileptiform discharges were seen throughout the recording. A normal interictal EEG does not exclude nor support the diagnosis of epilepsy. Priyanka Barbra Sarks   CT HEAD WO CONTRAST (5MM)  Result Date: 08/03/2022 CLINICAL DATA:  Hypotensive.  Loss of consciousness. EXAM: CT HEAD WITHOUT CONTRAST  TECHNIQUE: Contiguous axial images were obtained from the base of the skull through the vertex without intravenous contrast. RADIATION DOSE REDUCTION: This exam was performed according to the departmental dose-optimization program which includes automated exposure control, adjustment of the mA and/or kV according to patient size and/or use of iterative reconstruction technique. COMPARISON:  07/26/2022 FINDINGS: Brain: No evidence of acute infarction, hemorrhage, hydrocephalus, extra-axial collection or mass lesion/mass effect. Vascular: No hyperdense vessel or unexpected calcification. Skull: Normal. Negative for fracture or focal lesion. Sinuses/Orbits: No acute finding. Other: None. IMPRESSION: 1. No acute intracranial abnormalities. Electronically Signed   By: Kerby Moors M.D.   On: 08/03/2022 07:33   DG CHEST PORT 1 VIEW  Result Date: 08/03/2022 CLINICAL DATA:  Shortness of breath EXAM: PORTABLE CHEST 1 VIEW COMPARISON:  Eight days ago FINDINGS: Interstitial opacity with some Kerley lines. No visible effusion. Cardiopericardial enlargement. Prior median sternotomy for AV valve repairs. IMPRESSION: CHF pattern.  Electronically Signed   By: Jorje Guild M.D.   On: 08/03/2022 06:40   DG Knee 1-2 Views Left  Result Date: 08/03/2022 CLINICAL DATA:  Syncope and fall EXAM: LEFT KNEE - 2 VIEW COMPARISON:  05/28/2022 FINDINGS: No evidence of fracture, dislocation, or joint effusion. Joint space narrowing with mild spurring at the medial compartment. IMPRESSION: No acute finding. 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 Electronically Signed   By: Jorje Guild M.D.   On: 08/03/2022 04:16   DG Knee 1-2 Views Right  Result Date: 08/03/2022 CLINICAL DATA:  Syncope and fall EXAM: RIGHT KNEE - 2 VIEW COMPARISON:  None Available. FINDINGS: No evidence of fracture, dislocation, or joint effusion. Minimal marginal spurring at the medial compartment. IMPRESSION: No acute finding. Electronically Signed   By: Jorje Guild M.D.   On: 08/03/2022 04:15   ECHO TEE  Result Date: 07/28/2022    TRANSESOPHOGEAL ECHO REPORT   Patient Name:   SYNIYAH BOURNE Date of Exam: 07/28/2022 Medical Rec #:  [id number]         Height:       71.0 in Accession #:    [id number]        Weight:       205.0 lb Date of Birth:  [birth year]         BSA:          2.131 m Patient Age:    27 years          BP:           114/72 mmHg Patient Gender: F                 HR:           83 bpm. Exam Location:  Inpatient Procedure: Transesophageal Echo, 3D Echo, Color Doppler and Cardiac Doppler Indications:     elevated mitral valve gradients  History:         Patient has prior history of Echocardiogram examinations, most                  recent 07/27/2022. Risk Factors:Diabetes, Hypertension, GERD and                  Current Smoker.                   Mitral Valve: prosthetic annuloplasty ring valve is present in                  the mitral position. Procedure Date: 04/19/22.  Sonographer:  Bernadene Person RDCS Referring Phys:  [id number] [facility] A Gasper Sells Diagnosing Phys: Sanda Klein MD PROCEDURE: After discussion of the risks and benefits of a TEE, an  informed consent was obtained from the patient. The transesophogeal probe was passed without difficulty through the esophogus of the patient. Sedation performed by different physician. The patient was monitored while under deep sedation. Anesthestetic sedation was provided intravenously by Anesthesiology: 137.14m of Propofol, 878mof Lidocaine. The patient's vital signs; including heart rate, blood pressure, and oxygen saturation; remained stable throughout the procedure. The patient developed no complications during the procedure. IMPRESSIONS  1. Left ventricular ejection fraction, by estimation, is 60 to 65%. The left ventricle has normal function. The left ventricle has no regional wall motion abnormalities. There is mild concentric left ventricular hypertrophy. Left ventricular diastolic function could not be evaluated.  2. Right ventricular systolic function is normal. The right ventricular size is normal. There is moderately elevated pulmonary artery systolic pressure. The estimated right ventricular systolic pressure is 5429.7mHg.  3. Left atrial size was severely dilated. No left atrial/left atrial appendage thrombus was detected. The LAA emptying velocity was 70 cm/s.  4. Right atrial size was mildly dilated.  5. The mitral valve has been repaired/replaced. Mild mitral valve regurgitation. Mild mitral stenosis. The mean mitral valve gradient is 6.6 mmHg with average heart rate of 83 bpm. 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 Procedure Date: 04/19/22.  6. The tricuspid valve is has been repaired/replaced. 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 Tricuspid valve regurgitation is moderate.  7. The aortic valve is tricuspid. Aortic valve regurgitation is not visualized. No aortic stenosis is present. Comparison(s): A "double envelope" or "ghosting" artifact is seen during CW interrogation of the mitral inflow. This may have led to overestimation of  the transmitral gradient on the previous study. FINDINGS  Left Ventricle: Left ventricular ejection fraction, by estimation, is 60 to 65%. The left ventricle has normal function. The left ventricle has no regional wall motion abnormalities. The left ventricular internal cavity size was normal in size. There is  mild concentric left ventricular hypertrophy. Left ventricular diastolic function could not be evaluated due to mitral valve repair. Left ventricular diastolic function could not be evaluated. Right Ventricle: The right ventricular size is normal. No increase in right ventricular wall thickness. Right ventricular systolic function is normal. There is moderately elevated pulmonary artery systolic pressure. The tricuspid regurgitant velocity is 3.52 m/s, and with an assumed right atrial pressure of 5 mmHg, the estimated right ventricular systolic pressure is 5498.9mHg. Left Atrium: Left atrial size was severely dilated. No left atrial/left atrial appendage thrombus was detected. The LAA emptying velocity was 70 cm/s. Right Atrium: Right atrial size was mildly dilated. Pericardium: There is no evidence of pericardial effusion. Mitral Valve: The mitral valve has been repaired/replaced. Mild mitral valve regurgitation, with centrally-directed jet. 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 Procedure Date: 04/19/22. Mild mitral valve stenosis. The mean  mitral valve gradient is 6.6 mmHg with average heart rate of 83 bpm. Tricuspid Valve: The tricuspid valve is has been repaired/replaced. Tricuspid valve regurgitation is moderate. 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 Aortic Valve: The aortic valve is tricuspid. Aortic valve regurgitation is not visualized. No aortic stenosis is present. Pulmonic Valve: The pulmonic valve was normal in structure. Pulmonic valve regurgitation is not visualized. Aorta: The aortic root, ascending aorta, aortic arch and  descending aorta are all structurally normal, with no evidence of dilitation or obstruction. There is minimal (Grade I) plaque. IAS/Shunts: No atrial level shunt detected by color flow Doppler.  MITRAL VALVE             TRICUSPID VALVE MV Area (PHT): 2.48 cm  TV Area (PHT):  2.26 cm MV Mean grad:  6.6 mmHg  TV Mean grad:   1.3 mmHg MV Decel Time: 306 msec  TV PHT:         97 msec                          TR Peak grad:   49.6 mmHg                          TR Vmax:        352.00 cm/s Dani Gobble Croitoru MD Electronically signed by Sanda Klein MD Signature Date/Time: 07/28/2022/2:32:07 PM    Final    ECHOCARDIOGRAM LIMITED  Result Date: 07/27/2022    ECHOCARDIOGRAM LIMITED REPORT   Patient Name:   LICHELLE VIETS Date of Exam: 07/27/2022 Medical Rec #:  [id number]         Height:       71.0 in Accession #:    [id number]        Weight:       210.2 lb Date of Birth:  1961-03-09         BSA:          2.154 m Patient Age:    38 years          BP:           155/82 mmHg Patient Gender: F                 HR:           87 bpm. Exam Location:  Inpatient Procedure: Limited Echo, Limited Color Doppler and Cardiac Doppler Indications:    mitral valve disorder  History:        Patient has prior history of Echocardiogram examinations, most                 recent 05/31/2022. CHF, Arrythmias:Bradycardia; Risk                 Factors:Dyslipidemia, Diabetes and Current Smoker.                  Mitral Valve: valve is present in the mitral position. Procedure                 Date: 04/19/22.  Sonographer:    Johny Chess RDCS Referring Phys: [id number] [place] A CHANDRASEKHAR IMPRESSIONS  1. Left ventricular ejection fraction, by estimation, is 50 to 55%. The left ventricle has low normal function.  2. Left atrial size was severely dilated.  3. Right atrial size was moderately dilated.  4. S/p MV prosthetic annuloplasty ring placed 04/19/2022. mod-severe mitral stenosis, MV mean gradient 15 mmHg, peak 27 mmhg at HR 87 bpm. Mild mitral  valve regurgitation. There is a present in the mitral position. Procedure Date: 04/19/22.  5. 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 Tricuspid valve regurgitation is moderate.  6. Aortic valve regurgitation is not visualized.  7. There is severely elevated pulmonary artery systolic pressure. Conclusion(s)/Recommendation(s): Compared to prior study, MV mean gradient has increased. May be related to rate. Consider TEE/cardiac CT  if clinically indicated. FINDINGS  Left Ventricle: Left ventricular ejection fraction, by estimation, is 50 to 55%. The left ventricle has low normal function. Right Ventricle: There is severely elevated pulmonary artery systolic pressure. The tricuspid regurgitant velocity is 3.74 m/s, and with an assumed right atrial pressure of 5 mmHg, the estimated right ventricular systolic pressure is 34.7 mmHg. Left Atrium: Left atrial size was severely dilated. Right Atrium: Right atrial size was moderately dilated. Mitral Valve: S/p MV prosthetic annuloplasty ring placed 04/19/2022. mod-severe mitral stenosis, MV mean gradient 15 mmHg, peak 27 mmhg at HR 87 bpm. Mild mitral valve regurgitation. There is a present in the mitral position. Procedure Date: 04/19/22. MV peak gradient, 26.8 mmHg. The mean mitral valve gradient is 15.0 mmHg. Tricuspid Valve: Tricuspid valve regurgitation is moderate. 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 Aortic Valve: Aortic valve regurgitation is not visualized. Pulmonic Valve: Pulmonic valve regurgitation is not visualized. LEFT VENTRICLE PLAX 2D LVOT diam:     2.30 cm LV SV:         88 LV SV Index:   41 LVOT Area:     4.15 cm  IVC IVC diam: 2.20 cm AORTIC VALVE             PULMONIC VALVE LVOT Vmax:   114.50 cm/s RVOT Peak grad: 1 mmHg LVOT Vmean:  75.250 cm/s LVOT VTI:    0.213 m MITRAL VALVE              TRICUSPID VALVE MV Area VTI:  1.53 cm    TV Peak grad:   6.5 mmHg MV Peak grad: 26.8 mmHg   TV Mean grad:    4.0 mmHg MV Mean grad: 15.0 mmHg   TV Vmax:        1.27 m/s MV Vmax:      2.59 m/s    TV Vmean:       93.6 cm/s MV Vmean:     183.0 cm/s  TV VTI:         0.29 msec MR Peak grad: 149.3 mmHg  TR Peak grad:   56.0 mmHg MR Mean grad: 97.0 mmHg   TR Vmax:        374.00 cm/s MR Vmax:      611.00 cm/s MR Vmean:     469.0 cm/s  SHUNTS                           Systemic VTI:  0.21 m                           Systemic Diam: 2.30 cm                           Pulmonic VTI:  0.107 m Phineas Inches Electronically signed by Phineas Inches Signature Date/Time: 07/27/2022/12:28:20 PM    Final    DG Chest Port 1 View  Result Date: 07/26/2022 CLINICAL DATA:  Syncope EXAM: PORTABLE CHEST 1 VIEW COMPARISON:  07/22/2022 FINDINGS: Transverse diameter of heart is increased. Central pulmonary vessels are more prominent. Increased interstitial markings are seen in the parahilar regions and lower lung fields. There is no focal consolidation. There is no pleural effusion or pneumothorax. 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 IMPRESSION: Central pulmonary vessels are more prominent. Increased interstitial markings are seen in the parahilar regions and lower  lung fields. Findings suggest CHF with interstitial pulmonary edema. Electronically Signed   By: Elmer Picker M.D.   On: 07/26/2022 12:49   CT Head Wo Contrast  Result Date: 07/26/2022 CLINICAL DATA:  Head trauma, coagulopathy, syncopal episode striking head, history MVR, d type II iabetes mellitus, hypertension, smoker EXAM: CT HEAD WITHOUT CONTRAST TECHNIQUE: Contiguous axial images were obtained from the base of the skull through the vertex without intravenous contrast. RADIATION DOSE REDUCTION: This exam was performed according to the departmental dose-optimization program which includes automated exposure control, adjustment of the mA and/or kV according to patient size and/or use of iterative reconstruction technique. COMPARISON:  05/22/2022  FINDINGS: Brain: Normal ventricular morphology. No midline shift or mass effect. Normal appearance of brain parenchyma. No intracranial hemorrhage, mass lesion, evidence of acute infarction, or extra-axial fluid collection. Few scattered nonspecific dural calcifications unchanged. Vascular: No hyperdense vessels. Skull: Intact Sinuses/Orbits: Clear Other: N/A IMPRESSION: No acute intracranial abnormalities. Electronically Signed   By: Lavonia Dana M.D.   On: 07/26/2022 12:27   DG Chest Port 1 View  Result Date: 07/22/2022 CLINICAL DATA:  Back pain and bradycardia EXAM: PORTABLE CHEST 1 VIEW COMPARISON:  05/28/2022 FINDINGS: Cardiac shadow is within normal limits. Postsurgical changes are again seen. Lungs are well aerated bilaterally. No acute bony abnormality is seen. IMPRESSION: No active disease. Electronically Signed   By: Inez Catalina M.D.   On: 07/22/2022 19:38    Microbiology: Results for orders placed or performed during the hospital encounter of 05/28/22  Resp Panel by RT-PCR (Flu A&B, Covid) Anterior Nasal Swab     Status: None   Collection Time: 05/28/22  4:28 PM   Specimen: Anterior Nasal Swab  Result Value Ref Range Status   SARS Coronavirus 2 by RT PCR NEGATIVE NEGATIVE Final    Comment: (NOTE) SARS-CoV-2 target nucleic acids are NOT DETECTED.  The SARS-CoV-2 RNA is generally detectable in upper respiratory specimens during the acute phase of infection. The lowest concentration of SARS-CoV-2 viral copies this assay can detect is 138 copies/mL. A negative result does not preclude SARS-Cov-2 infection and should not be used as the sole basis for treatment or other patient management decisions. A negative result may occur with  improper specimen collection/handling, submission of specimen other than nasopharyngeal swab, presence of viral mutation(s) within the areas targeted by this assay, and inadequate number of viral copies(<138 copies/mL). A negative result must be combined  with clinical observations, patient history, and epidemiological information. The expected result is Negative.  Fact Sheet for Patients:  EntrepreneurPulse.com.au  Fact Sheet for Healthcare Providers:  IncredibleEmployment.be  This test is no t yet approved or cleared by the [place] FDA and  has been authorized for detection and/or diagnosis of SARS-CoV-2 by FDA under an Emergency Use Authorization (EUA). This EUA will remain  in effect (meaning this test can be used) for the duration of the COVID-19 declaration under Section 564(b)(1) of the Act, 21 U.S.C.section 360bbb-3(b)(1), unless the authorization is terminated  or revoked sooner.       Influenza A by PCR NEGATIVE NEGATIVE Final   Influenza B by PCR NEGATIVE NEGATIVE Final    Comment: (NOTE) The Xpert Xpress SARS-CoV-2/FLU/RSV plus assay is intended as an aid in the diagnosis of influenza from Nasopharyngeal swab specimens and should not be used as a sole basis for treatment. Nasal washings and aspirates are unacceptable for Xpert Xpress SARS-CoV-2/FLU/RSV testing.  Fact Sheet for Patients: EntrepreneurPulse.com.au  Fact Sheet for Healthcare Providers: IncredibleEmployment.be  This test is not yet approved or cleared by the [place] and has been authorized for detection and/or diagnosis of SARS-CoV-2 by FDA under an Emergency Use Authorization (EUA). This EUA will remain in effect (meaning this test can be used) for the duration of the COVID-19 declaration under Section 564(b)(1) of the Act, 21 U.S.C. section 360bbb-3(b)(1), unless the authorization is terminated or revoked.  Performed at [facility], [place] [address], [place], [place] [address]     Labs: CBC: Recent Labs  Lab 07/30/22 [phone] 07/31/22 [phone] 08/03/22 0047 08/03/22 0339 08/03/22 0713 08/04/22 1116  WBC 7.8 9.9 6.0  --  6.3 5.6  NEUTROABS  --   --  3.0   --   --   --   HGB 11.1* 11.9* 9.2* 10.0* 9.1* 9.3*  HCT 33.2* 35.3* 29.6* 31.9* 30.9* 29.0*  MCV 86.5 85.7 92.8  --  97.5 90.6  PLT 274 313 223  --  217 009   Basic Metabolic Panel: Recent Labs  Lab 07/29/22 0451 07/30/22 0552 07/31/22 0633 08/03/22 0047 08/04/22 1116  NA 138 139 135 133* 135  K 4.2 4.2 4.4 4.1 4.4  CL 107 110 106 102 104  CO2 24 21* 19* 19* 23  GLUCOSE 91 94 106* 84 113*  BUN 11 11 12  26* 19  CREATININE 1.13* 1.12* 1.19* 3.14* 1.79*  CALCIUM 8.2* 8.6* 8.6* 7.3* 7.6*   Liver Function Tests: Recent Labs  Lab 07/29/22 0451 07/30/22 0552 07/31/22 0633 08/03/22 0047  AST 20 34 43* 21  ALT 16 16 18 12   ALKPHOS 65 71 75 57  BILITOT 0.3 0.8 1.0 0.6  PROT 5.7* 6.5 7.0 5.9*  ALBUMIN 2.8* 3.2* 3.6 3.3*   CBG: Recent Labs  Lab 08/03/22 1110 08/03/22 1556 08/03/22 2257 08/04/22 0606 08/04/22 1108  GLUCAP 147* 114* 105* 107* 109*    Discharge time spent: greater than 30 minutes.  This record has been created using Systems analyst. Errors have been sought and corrected,but may not always be located. Such creation errors do not reflect on the standard of care.   Signed: Lorella Nimrod, MD [facility] 08/04/2022

## 2022-08-04 NOTE — Procedures (Signed)
Patient Name: [name]  MRN: [id number]  Epilepsy Attending: Lora Havens  Referring Physician/Provider: Charlynne Cousins Date: 08/04/2022 Duration: 24.37 mins  Patient history: 61 year old female with syncope.  EEG to evaluate for seizure.  Level of alertness: Awake  AEDs during EEG study: None  Technical aspects: This EEG study was done with scalp electrodes positioned according to the 10-20 International system of electrode placement. Electrical activity was reviewed with band pass filter of 1-'70Hz'$ , sensitivity of 7 uV/mm, display speed of 53m/sec with a '60Hz'$  notched filter applied as appropriate. EEG data were recorded continuously and digitally stored.  Video monitoring was available and reviewed as appropriate.  Description: The posterior dominant rhythm consists of 8-9 Hz activity of moderate voltage (25-35 uV) seen predominantly in posterior head regions, symmetric and reactive to eye opening and eye closing. Physiologic photic driving was not seen during photic stimulation.  Hyperventilation was not performed.     IMPRESSION: This study is within normal limits. No seizures or epileptiform discharges were seen throughout the recording.  A normal interictal EEG does not exclude nor support the diagnosis of epilepsy.   Kohle Winner OBarbra Sarks

## 2022-08-05 ENCOUNTER — Encounter (HOSPITAL_COMMUNITY): Payer: Self-pay

## 2022-08-05 ENCOUNTER — Inpatient Hospital Stay (HOSPITAL_COMMUNITY)
Admission: EM | Admit: 2022-08-05 | Discharge: 2022-08-10 | DRG: 291 | Disposition: A | Discharge status: Home Health | Payer: Medicare Other | Attending: Student | Admitting: Student

## 2022-08-05 ENCOUNTER — Emergency Department (HOSPITAL_COMMUNITY): Payer: Medicare Other

## 2022-08-05 ENCOUNTER — Other Ambulatory Visit: Payer: Self-pay

## 2022-08-05 DIAGNOSIS — Z833 Family history of diabetes mellitus: Secondary | ICD-10-CM

## 2022-08-05 DIAGNOSIS — R778 Other specified abnormalities of plasma proteins: Secondary | ICD-10-CM | POA: Diagnosis not present

## 2022-08-05 DIAGNOSIS — I110 Hypertensive heart disease with heart failure: Principal | ICD-10-CM | POA: Diagnosis present

## 2022-08-05 DIAGNOSIS — Z6830 Body mass index (BMI) 30.0-30.9, adult: Secondary | ICD-10-CM

## 2022-08-05 DIAGNOSIS — F32A Depression, unspecified: Secondary | ICD-10-CM

## 2022-08-05 DIAGNOSIS — J441 Chronic obstructive pulmonary disease with (acute) exacerbation: Secondary | ICD-10-CM | POA: Diagnosis not present

## 2022-08-05 DIAGNOSIS — E1140 Type 2 diabetes mellitus with diabetic neuropathy, unspecified: Secondary | ICD-10-CM | POA: Diagnosis present

## 2022-08-05 DIAGNOSIS — I480 Paroxysmal atrial fibrillation: Secondary | ICD-10-CM | POA: Diagnosis present

## 2022-08-05 DIAGNOSIS — Z79899 Other long term (current) drug therapy: Secondary | ICD-10-CM

## 2022-08-05 DIAGNOSIS — Z720 Tobacco use: Secondary | ICD-10-CM | POA: Diagnosis present

## 2022-08-05 DIAGNOSIS — Z885 Allergy status to narcotic agent status: Secondary | ICD-10-CM

## 2022-08-05 DIAGNOSIS — B182 Chronic viral hepatitis C: Secondary | ICD-10-CM | POA: Diagnosis present

## 2022-08-05 DIAGNOSIS — E7800 Pure hypercholesterolemia, unspecified: Secondary | ICD-10-CM | POA: Diagnosis present

## 2022-08-05 DIAGNOSIS — J9601 Acute respiratory failure with hypoxia: Secondary | ICD-10-CM | POA: Diagnosis present

## 2022-08-05 DIAGNOSIS — R9431 Abnormal electrocardiogram [ECG] [EKG]: Secondary | ICD-10-CM

## 2022-08-05 DIAGNOSIS — I471 Supraventricular tachycardia: Secondary | ICD-10-CM | POA: Diagnosis not present

## 2022-08-05 DIAGNOSIS — N179 Acute kidney failure, unspecified: Secondary | ICD-10-CM | POA: Diagnosis not present

## 2022-08-05 DIAGNOSIS — Z7901 Long term (current) use of anticoagulants: Secondary | ICD-10-CM

## 2022-08-05 DIAGNOSIS — K9189 Other postprocedural complications and disorders of digestive system: Secondary | ICD-10-CM | POA: Diagnosis not present

## 2022-08-05 DIAGNOSIS — F17210 Nicotine dependence, cigarettes, uncomplicated: Secondary | ICD-10-CM | POA: Diagnosis present

## 2022-08-05 DIAGNOSIS — J449 Chronic obstructive pulmonary disease, unspecified: Secondary | ICD-10-CM

## 2022-08-05 DIAGNOSIS — E1165 Type 2 diabetes mellitus with hyperglycemia: Secondary | ICD-10-CM | POA: Diagnosis present

## 2022-08-05 DIAGNOSIS — F419 Anxiety disorder, unspecified: Secondary | ICD-10-CM | POA: Diagnosis not present

## 2022-08-05 DIAGNOSIS — I161 Hypertensive emergency: Secondary | ICD-10-CM | POA: Diagnosis present

## 2022-08-05 DIAGNOSIS — E860 Dehydration: Secondary | ICD-10-CM | POA: Diagnosis not present

## 2022-08-05 DIAGNOSIS — E669 Obesity, unspecified: Secondary | ICD-10-CM | POA: Diagnosis present

## 2022-08-05 DIAGNOSIS — Y848 Other medical procedures as the cause of abnormal reaction of the patient, or of later complication, without mention of misadventure at the time of the procedure: Secondary | ICD-10-CM | POA: Diagnosis not present

## 2022-08-05 DIAGNOSIS — D649 Anemia, unspecified: Secondary | ICD-10-CM | POA: Diagnosis present

## 2022-08-05 DIAGNOSIS — Z20822 Contact with and (suspected) exposure to covid-19: Secondary | ICD-10-CM | POA: Diagnosis present

## 2022-08-05 DIAGNOSIS — Z952 Presence of prosthetic heart valve: Secondary | ICD-10-CM

## 2022-08-05 DIAGNOSIS — K921 Melena: Secondary | ICD-10-CM | POA: Diagnosis not present

## 2022-08-05 DIAGNOSIS — G894 Chronic pain syndrome: Secondary | ICD-10-CM | POA: Diagnosis present

## 2022-08-05 DIAGNOSIS — M797 Fibromyalgia: Secondary | ICD-10-CM | POA: Diagnosis present

## 2022-08-05 DIAGNOSIS — I493 Ventricular premature depolarization: Secondary | ICD-10-CM | POA: Diagnosis not present

## 2022-08-05 DIAGNOSIS — D62 Acute posthemorrhagic anemia: Secondary | ICD-10-CM | POA: Diagnosis not present

## 2022-08-05 DIAGNOSIS — I5023 Acute on chronic systolic (congestive) heart failure: Secondary | ICD-10-CM

## 2022-08-05 DIAGNOSIS — Z9889 Other specified postprocedural states: Secondary | ICD-10-CM | POA: Diagnosis not present

## 2022-08-05 DIAGNOSIS — Z8249 Family history of ischemic heart disease and other diseases of the circulatory system: Secondary | ICD-10-CM

## 2022-08-05 DIAGNOSIS — I10 Essential (primary) hypertension: Secondary | ICD-10-CM

## 2022-08-05 DIAGNOSIS — I959 Hypotension, unspecified: Secondary | ICD-10-CM | POA: Diagnosis present

## 2022-08-05 DIAGNOSIS — E119 Type 2 diabetes mellitus without complications: Secondary | ICD-10-CM

## 2022-08-05 DIAGNOSIS — E1169 Type 2 diabetes mellitus with other specified complication: Secondary | ICD-10-CM

## 2022-08-05 DIAGNOSIS — Z7984 Long term (current) use of oral hypoglycemic drugs: Secondary | ICD-10-CM

## 2022-08-05 DIAGNOSIS — R7989 Other specified abnormal findings of blood chemistry: Secondary | ICD-10-CM

## 2022-08-05 DIAGNOSIS — K219 Gastro-esophageal reflux disease without esophagitis: Secondary | ICD-10-CM | POA: Diagnosis present

## 2022-08-05 DIAGNOSIS — K922 Gastrointestinal hemorrhage, unspecified: Secondary | ICD-10-CM | POA: Diagnosis not present

## 2022-08-05 HISTORY — DX: Heart failure, unspecified: I50.9

## 2022-08-05 LAB — GLUCOSE, CAPILLARY
Glucose-Capillary: 102 mg/dL — ABNORMAL HIGH (ref 70–99)
Glucose-Capillary: 110 mg/dL — ABNORMAL HIGH (ref 70–99)

## 2022-08-05 LAB — COMPREHENSIVE METABOLIC PANEL
ALT: 13 U/L (ref 0–44)
AST: 26 U/L (ref 15–41)
Albumin: 3.4 g/dL — ABNORMAL LOW (ref 3.5–5.0)
Alkaline Phosphatase: 75 U/L (ref 38–126)
Anion gap: 9 (ref 5–15)
BUN: 11 mg/dL (ref 8–23)
CO2: 23 mmol/L (ref 22–32)
Calcium: 8.5 mg/dL — ABNORMAL LOW (ref 8.9–10.3)
Chloride: 109 mmol/L (ref 98–111)
Creatinine, Ser: 1.08 mg/dL — ABNORMAL HIGH (ref 0.44–1.00)
GFR, Estimated: 58 mL/min — ABNORMAL LOW (ref >60)
Glucose, Bld: 102 mg/dL — ABNORMAL HIGH (ref 70–99)
Potassium: 4.3 mmol/L (ref 3.5–5.1)
Sodium: 141 mmol/L (ref 135–145)
Total Bilirubin: 1.1 mg/dL (ref 0.3–1.2)
Total Protein: 6.5 g/dL (ref 6.5–8.1)

## 2022-08-05 LAB — PROTIME-INR
INR: 1.4 — ABNORMAL HIGH (ref 0.8–1.2)
INR: 1.4 — ABNORMAL HIGH (ref 0.8–1.2)
Prothrombin Time: 17.1 seconds — ABNORMAL HIGH (ref 11.4–15.2)
Prothrombin Time: 16.9 seconds — ABNORMAL HIGH (ref 11.4–15.2)

## 2022-08-05 LAB — TYPE AND SCREEN
ABO/RH(D): B POS
Antibody Screen: NEGATIVE
Unit division: 0
Unit division: 0

## 2022-08-05 LAB — CBC
HCT: 32.9 % — ABNORMAL LOW (ref 36.0–46.0)
Hemoglobin: 10.8 g/dL — ABNORMAL LOW (ref 12.0–15.0)
MCH: 28.6 pg (ref 26.0–34.0)
MCHC: 32.8 g/dL (ref 30.0–36.0)
MCV: 87 fL (ref 80.0–100.0)
Platelets: 270 10*3/uL (ref 150–400)
RBC: 3.78 MIL/uL — ABNORMAL LOW (ref 3.87–5.11)
RDW: 16.6 % — ABNORMAL HIGH (ref 11.5–15.5)
WBC: 7 10*3/uL (ref 4.0–10.5)
nRBC: 0 % (ref 0.0–0.2)

## 2022-08-05 LAB — RAPID URINE DRUG SCREEN, HOSP PERFORMED
Amphetamines: NOT DETECTED
Barbiturates: NOT DETECTED
Benzodiazepines: NOT DETECTED
Cocaine: NOT DETECTED
Opiates: NOT DETECTED
Tetrahydrocannabinol: NOT DETECTED

## 2022-08-05 LAB — BPAM RBC
Blood Product Expiration Date: 202309192359
Blood Product Expiration Date: 202309212359
Unit Type and Rh: 7300
Unit Type and Rh: 7300

## 2022-08-05 LAB — TROPONIN I (HIGH SENSITIVITY)
Troponin I (High Sensitivity): 69 ng/L — ABNORMAL HIGH (ref <18)
Troponin I (High Sensitivity): 143 ng/L (ref <18)
Troponin I (High Sensitivity): 191 ng/L (ref <18)
Troponin I (High Sensitivity): 190 ng/L (ref <18)

## 2022-08-05 LAB — LACTIC ACID, PLASMA
Lactic Acid, Venous: 1.1 mmol/L (ref 0.5–1.9)
Lactic Acid, Venous: 1.1 mmol/L (ref 0.5–1.9)

## 2022-08-05 LAB — BLOOD GAS, VENOUS
Acid-Base Excess: 1.5 mmol/L (ref 0.0–2.0)
Bicarbonate: 24.7 mmol/L (ref 20.0–28.0)
Drawn by: 1470
O2 Saturation: 99 %
Patient temperature: 37
pCO2, Ven: 34 mmHg — ABNORMAL LOW (ref 44–60)
pH, Ven: 7.47 — ABNORMAL HIGH (ref 7.25–7.43)
pO2, Ven: 89 mmHg — ABNORMAL HIGH (ref 32–45)

## 2022-08-05 LAB — SARS CORONAVIRUS 2 BY RT PCR: SARS Coronavirus 2 by RT PCR: NEGATIVE

## 2022-08-05 LAB — MRSA NEXT GEN BY PCR, NASAL: MRSA by PCR Next Gen: NOT DETECTED

## 2022-08-05 LAB — BRAIN NATRIURETIC PEPTIDE: B Natriuretic Peptide: 1742.3 pg/mL — ABNORMAL HIGH (ref 0.0–100.0)

## 2022-08-05 LAB — PROCALCITONIN: Procalcitonin: <0.1 ng/mL

## 2022-08-05 LAB — MAGNESIUM: Magnesium: 1.4 mg/dL — ABNORMAL LOW (ref 1.7–2.4)

## 2022-08-05 MED ORDER — WARFARIN SODIUM 7.5 MG PO TABS
7.5000 mg | ORAL_TABLET | Freq: Once | ORAL | Status: AC
  Administered 2022-08-05: 7.5 mg via ORAL
  Filled 2022-08-05: qty 1

## 2022-08-05 MED ORDER — VANCOMYCIN HCL 2000 MG/400ML IV SOLN
2000.0000 mg | INTRAVENOUS | Status: DC
  Administered 2022-08-06: 2000 mg via INTRAVENOUS
  Filled 2022-08-05: qty 400

## 2022-08-05 MED ORDER — LEVALBUTEROL HCL 0.63 MG/3ML IN NEBU: 0.6300 mg | INHALATION_SOLUTION | Freq: Four times a day (QID) | RESPIRATORY_TRACT | Status: DC | PRN

## 2022-08-05 MED ORDER — MAGNESIUM SULFATE 2 GM/50ML IV SOLN
2.0000 g | Freq: Once | INTRAVENOUS | Status: AC
  Administered 2022-08-05: 2 g via INTRAVENOUS
  Filled 2022-08-05: qty 50

## 2022-08-05 MED ORDER — FUROSEMIDE 10 MG/ML IJ SOLN
40.0000 mg | Freq: Every day | INTRAMUSCULAR | Status: DC
  Administered 2022-08-06: 40 mg via INTRAVENOUS
  Filled 2022-08-05 (×3): qty 4

## 2022-08-05 MED ORDER — ARIPIPRAZOLE 5 MG PO TABS
5.0000 mg | ORAL_TABLET | Freq: Every day | ORAL | Status: DC
  Administered 2022-08-05 – 2022-08-09 (×5): 5 mg via ORAL
  Filled 2022-08-05 (×5): qty 1

## 2022-08-05 MED ORDER — SODIUM CHLORIDE 0.9 % IV SOLN
2.0000 g | Freq: Once | INTRAVENOUS | Status: AC
  Administered 2022-08-05: 2 g via INTRAVENOUS
  Filled 2022-08-05: qty 12.5

## 2022-08-05 MED ORDER — TRAZODONE HCL 50 MG PO TABS
100.0000 mg | ORAL_TABLET | Freq: Every evening | ORAL | Status: DC | PRN
Start: 2022-08-05 — End: 2022-08-10
  Administered 2022-08-05 – 2022-08-09 (×4): 100 mg via ORAL
  Filled 2022-08-05 (×4): qty 2

## 2022-08-05 MED ORDER — WARFARIN - PHARMACIST DOSING INPATIENT: Freq: Every day | Status: DC

## 2022-08-05 MED ORDER — IPRATROPIUM-ALBUTEROL 0.5-2.5 (3) MG/3ML IN SOLN
3.0000 mL | RESPIRATORY_TRACT | Status: DC
  Administered 2022-08-05 – 2022-08-06 (×2): 3 mL via RESPIRATORY_TRACT
  Filled 2022-08-05 (×3): qty 3

## 2022-08-05 MED ORDER — PANTOPRAZOLE SODIUM 40 MG PO TBEC
40.0000 mg | DELAYED_RELEASE_TABLET | Freq: Every day | ORAL | Status: DC
  Administered 2022-08-05 – 2022-08-09 (×5): 40 mg via ORAL
  Filled 2022-08-05 (×5): qty 1

## 2022-08-05 MED ORDER — INSULIN ASPART 100 UNIT/ML IJ SOLN
0.0000 [IU] | Freq: Three times a day (TID) | INTRAMUSCULAR | Status: DC
  Administered 2022-08-06: 1 [IU] via SUBCUTANEOUS

## 2022-08-05 MED ORDER — ATORVASTATIN CALCIUM 10 MG PO TABS
20.0000 mg | ORAL_TABLET | Freq: Every day | ORAL | Status: DC
  Administered 2022-08-05 – 2022-08-09 (×5): 20 mg via ORAL
  Filled 2022-08-05 (×5): qty 2

## 2022-08-05 MED ORDER — ESTROGENS CONJUGATED 0.45 MG PO TABS
0.4500 mg | ORAL_TABLET | Freq: Every day | ORAL | Status: DC
  Administered 2022-08-06 – 2022-08-09 (×4): 0.45 mg via ORAL
  Filled 2022-08-05 (×5): qty 1

## 2022-08-05 MED ORDER — ACETAMINOPHEN 650 MG RE SUPP: 650.0000 mg | Freq: Four times a day (QID) | RECTAL | Status: DC | PRN

## 2022-08-05 MED ORDER — OXYCODONE HCL 5 MG PO TABS
7.5000 mg | ORAL_TABLET | Freq: Two times a day (BID) | ORAL | Status: DC | PRN
  Administered 2022-08-05 – 2022-08-06 (×2): 7.5 mg via ORAL
  Filled 2022-08-05 (×2): qty 2

## 2022-08-05 MED ORDER — VANCOMYCIN HCL 2000 MG/400ML IV SOLN
2000.0000 mg | Freq: Once | INTRAVENOUS | Status: AC
  Administered 2022-08-05: 2000 mg via INTRAVENOUS
  Filled 2022-08-05: qty 400

## 2022-08-05 MED ORDER — METHYLPREDNISOLONE SODIUM SUCC 125 MG IJ SOLR
125.0000 mg | Freq: Once | INTRAMUSCULAR | Status: AC
Start: 2022-08-05 — End: 2022-08-05
  Administered 2022-08-05: 125 mg via INTRAVENOUS
  Filled 2022-08-05: qty 2

## 2022-08-05 MED ORDER — BUPROPION HCL ER (XL) 300 MG PO TB24
300.0000 mg | ORAL_TABLET | Freq: Every morning | ORAL | Status: DC
  Administered 2022-08-06 – 2022-08-10 (×5): 300 mg via ORAL
  Filled 2022-08-05 (×5): qty 1

## 2022-08-05 MED ORDER — ACETAMINOPHEN 325 MG PO TABS
650.0000 mg | ORAL_TABLET | Freq: Four times a day (QID) | ORAL | Status: DC | PRN
  Administered 2022-08-06 – 2022-08-09 (×3): 650 mg via ORAL
  Filled 2022-08-05 (×3): qty 2

## 2022-08-05 MED ORDER — ESCITALOPRAM OXALATE 10 MG PO TABS
10.0000 mg | ORAL_TABLET | Freq: Every morning | ORAL | Status: DC
  Administered 2022-08-05 – 2022-08-09 (×5): 10 mg via ORAL
  Filled 2022-08-05 (×5): qty 1

## 2022-08-05 MED ORDER — NITROGLYCERIN IN D5W 200-5 MCG/ML-% IV SOLN
0.0000 ug/min | INTRAVENOUS | Status: DC
  Administered 2022-08-05: 5 ug/min via INTRAVENOUS
  Filled 2022-08-05: qty 250

## 2022-08-05 MED ORDER — SODIUM CHLORIDE 0.9% FLUSH
3.0000 mL | Freq: Two times a day (BID) | INTRAVENOUS | Status: DC
  Administered 2022-08-05 – 2022-08-09 (×6): 3 mL via INTRAVENOUS

## 2022-08-05 MED ORDER — LOSARTAN POTASSIUM 50 MG PO TABS
100.0000 mg | ORAL_TABLET | Freq: Every day | ORAL | Status: DC
Start: 2022-08-05 — End: 2022-08-07
  Administered 2022-08-05 – 2022-08-06 (×2): 100 mg via ORAL
  Filled 2022-08-05 (×3): qty 2

## 2022-08-05 MED ORDER — GABAPENTIN 600 MG PO TABS
300.0000 mg | ORAL_TABLET | Freq: Three times a day (TID) | ORAL | Status: DC
  Administered 2022-08-05 – 2022-08-10 (×14): 300 mg via ORAL
  Filled 2022-08-05 (×14): qty 1

## 2022-08-05 MED ORDER — SODIUM CHLORIDE 0.9 % IV SOLN
250.0000 mL | INTRAVENOUS | Status: DC | PRN
Start: 2022-08-05 — End: 2022-08-10

## 2022-08-05 MED ORDER — NICOTINE 14 MG/24HR TD PT24
14.0000 mg | MEDICATED_PATCH | Freq: Every day | TRANSDERMAL | Status: DC
  Administered 2022-08-05 – 2022-08-09 (×5): 14 mg via TRANSDERMAL
  Filled 2022-08-05 (×5): qty 1

## 2022-08-05 MED ORDER — FUROSEMIDE 10 MG/ML IJ SOLN
40.0000 mg | Freq: Once | INTRAMUSCULAR | Status: AC
  Administered 2022-08-05: 40 mg via INTRAVENOUS
  Filled 2022-08-05: qty 4

## 2022-08-05 MED ORDER — METOPROLOL TARTRATE 25 MG PO TABS
25.0000 mg | ORAL_TABLET | Freq: Two times a day (BID) | ORAL | Status: DC
  Administered 2022-08-05 – 2022-08-09 (×9): 25 mg via ORAL
  Filled 2022-08-05 (×10): qty 1

## 2022-08-05 MED ORDER — SODIUM CHLORIDE 0.9% FLUSH: 3.0000 mL | INTRAVENOUS | Status: DC | PRN

## 2022-08-05 MED ORDER — SPIRONOLACTONE 12.5 MG HALF TABLET
12.5000 mg | ORAL_TABLET | Freq: Every day | ORAL | Status: DC
  Administered 2022-08-05 – 2022-08-09 (×4): 12.5 mg via ORAL
  Filled 2022-08-05 (×5): qty 1

## 2022-08-05 MED ORDER — METOPROLOL TARTRATE 5 MG/5ML IV SOLN
5.0000 mg | Freq: Three times a day (TID) | INTRAVENOUS | Status: DC | PRN
Start: 2022-08-05 — End: 2022-08-10
  Administered 2022-08-05 – 2022-08-06 (×2): 5 mg via INTRAVENOUS
  Filled 2022-08-05 (×2): qty 5

## 2022-08-05 NOTE — Progress Notes (Signed)
Pharmacy Antibiotic Note  [name] is a 61 y.o. female admitted on 08/05/2022 with pneumonia.  Pharmacy has been consulted for Vancomycin dosing.  Pt is ordered cefepime 2g IV x1 to be given in the ED.  WBC 7, afebrile SCr 1.08  Plan: Initiate loading dose of Vancomycin '2000mg'$  IV x 1, followed by  Vancomycin '2000mg'$  IV q24h (eAUC ~520)    > Goal AUC 400-550    > Check vancomycin levels at steady state  Order MRSA PCR  Monitor daily CBC, temp, SCr, and for clinical signs of improvement  F/u cultures, MRSA PCR, and de-escalate antibiotics as able   Height: '5\' 11"'$  (180.3 cm) Weight: 98 kg (216 lb) IBW/kg (Calculated) : 70.8  Temp (24hrs), Avg:98.3 F (36.8 C), Min:98.3 F (36.8 C), Max:98.3 F (36.8 C)  Recent Labs  Lab 07/30/22 0552 07/31/22 0633 08/03/22 0047 08/03/22 0339 08/03/22 0713 08/04/22 1116 08/05/22 1130  WBC 7.8 9.9 6.0  --  6.3 5.6 7.0  CREATININE 1.12* 1.19* 3.14*  --   --  1.79* 1.08*  LATICACIDVEN  --   --   --  1.1 1.1  --   --     Estimated Creatinine Clearance: 69.7 mL/min (A) (by C-G formula based on SCr of 1.08 mg/dL (H)).    Allergies  Allergen Reactions   Norco [Hydrocodone-Acetaminophen] Itching and Other (See Comments)    Tolerates Oxycodone    Antimicrobials this admission: Cefepime 8/31 >>  Vancomycin 8/31 >>   Dose adjustments this admission: N/A  Microbiology results: 8/31 MRSA PCR: pending  Thank you for allowing pharmacy to be a part of this patient's care.  Luisa Hart, PharmD, BCPS Clinical Pharmacist 08/05/2022 1:34 PM   Please refer to [facility] for pharmacy phone number

## 2022-08-05 NOTE — Assessment & Plan Note (Signed)
Likely secondary to medication adjustment. Her cozaar, spironolactone and lasix were held with AKI and hypotension Lopressor decreased to '25mg'$  BID Renal function okay,starting back losartan, spironolactone and on IV lasix Continue lopressor '25mg'$  BID IV parameters

## 2022-08-05 NOTE — Assessment & Plan Note (Signed)
Continue wellbutrin and lexapro 

## 2022-08-05 NOTE — Assessment & Plan Note (Addendum)
-  based off findings on clinical exam including elevated JVD, orthopnea, findings on CXR: cardiomegaly/pulmonary edema, elevated BNP confirms that patient is in acute on chronic systolic CHF exacerbation -admitted a few days ago with fluid resuscitation due to hypotension/bradycardia and AKI and lasix/spiro/ARB held  -echo and TEE just done. EF of 50-55%. MVR/TVR with no complications. (04/07/83 echo EF of 45-50%) -strict I/O and daily weights -IV diuresis-'40mg'$  q 24 hours  -check magnesium and follow electrolytes -telemetry  -start back losartan/spironolactone, monitor renal function. Continue lopressor  -cardiology consulted in ED

## 2022-08-05 NOTE — Assessment & Plan Note (Addendum)
Continue oxycodone 7.'5mg'$  BID prn (decreaesd from '15mg'$ )

## 2022-08-05 NOTE — Assessment & Plan Note (Signed)
Continue lipitor daily  ?

## 2022-08-05 NOTE — Assessment & Plan Note (Signed)
She has some chest tightness complaints, no pain. ekg with sinus tachy and prolonged QT ? Demand ischemia vs. HTN urgency  Continue to trend troponin Blood pressure control  Cardiology consulted

## 2022-08-05 NOTE — Assessment & Plan Note (Addendum)
Optimize electrolytes-repelete magnesium Keep on telemetry Avoid qt prolonging drugs  Start lexapro/abilify tomorrow, hold if still prolonged  Repeat ekg in AM ]

## 2022-08-05 NOTE — ED Notes (Signed)
[place]ugh sister [phone] requesting an update on the patient

## 2022-08-05 NOTE — ED Notes (Signed)
Bi-pap removed and Capac placed

## 2022-08-05 NOTE — ED Triage Notes (Signed)
SOB started last night. This morning worsening with CP. Recent dx of HF given lasix. 86% on 2L [place] baseline oxygen.   EMS rales in lungs, tachy 120, BP 190/132,   324 aspirin, 0.'4mg'$  x2 nitro  20g RFA

## 2022-08-05 NOTE — Assessment & Plan Note (Signed)
No diagnosis of COPD, but long standing history of smoking and presenting like exacerbation with wheezing, tightness and course rales -schedule duonebs -xoponex prn -give one dose of steroids today and re-eval tomorrow -given vanc/cefepime. Has 2/3 cardinal symptoms. Can likely de-escalate these once PCT and repeat 2V CXR results  -nicotine patch and encouraged smoking cesssation -IS to bedside -outpatient PFTs

## 2022-08-05 NOTE — Assessment & Plan Note (Signed)
?   COPD Nicotine patch Encouraged cessation

## 2022-08-05 NOTE — H&P (Addendum)
History and Physical    Patient: [name] IZT:[id number] DOB: [birth year] DOA: 08/05/2022 DOS: the patient was seen and examined on 08/05/2022 PCP: [facility] [place]  Patient coming from: Home - lives alone. Uses a walker to ambulate, has a WC as well.    Chief Complaint: shortness of breath   HPI: [name] is a 61 y.o. female with medical history significant of  paroxysmal atrial fibrillation, diabetes mellitus, hypertension, mitral valve and tricuspid valve repair subsequent to which patient also had cardiac tamponade early part of this year had underwent pericardiocentesis recently admitted 8/21 for GI bleed had colon polyps removed and also had angiodysplasia and APC and then admitted again on 8/28 for syncope thought to be secondary to severe dehydration. She was discharged home yesterday around 3pm and felt okay. She woke up this morning around 6am and was very short of breath, hypertensive. Her sister called and her then called 911. She also states her left arm was swollen.She was on all fours, gasping for air when EMS arrived and required bipap. She has been coughing +orthopnea. Denies any swelling in her legs. She has a heaviness in her chest that started this AM as well.   Denies any fever/chills, vision changes/headaches, chest pain or palpitations, abdominal pain, N/V/D, dysuria or leg swelling.    She does smoke (3-4 cigarettes/day), doesn't drink alcohol often.    ER Course:  vitals: afebrile, bp: 192/117, HR: 118, RR: 26, oxygen: 100% on bipap>4L [place] Pertinent labs: hgb: 10.8, troponin 69>pending, bnp: 1742,  CXR: vascular congestion with possible mild pulmonary interstitial edema. Patch opacities in the right base could reflect developing infection.  In ED: given '40mg'$  IV lasix, started on NG, vanc/cefepime. Cardiology consulted.    Review of Systems: As mentioned in the history of present illness. All other systems reviewed and are negative. Past Medical  History:  Diagnosis Date   Acute pericardial effusion 05/17/2022   AKI (acute kidney injury) ([place]) 05/17/2022   Anxiety    Arthritis    Cervicalgia    CHF (congestive heart failure) (HCC)    Chondromalacia of patella    Chronic neck pain    Chronic pain syndrome    Depression    secondary to loss of her son at age 47   Diabetic neuropathy ([place])    DMII (diabetes mellitus, type 2) ([place])    Dysrhythmia    Enthesopathy of hip region    Fibromyalgia    GERD (gastroesophageal reflux disease)    Hep C w/o coma, chronic ([place])    Hepatitis C    diagnosed 2005   Hypertension    Insomnia    Low back pain    Lumbago    Mitral regurgitation    Obesity    Pain in joint, upper arm    Primary localized osteoarthrosis, lower leg    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   Substance abuse ([place])    sober since 2001   Tobacco abuse    Tricuspid regurgitation    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   Past Surgical History:  Procedure Laterality Date   ABDOMINAL HYSTERECTOMY     at age 46, unknown reasons   CARPAL TUNNEL RELEASE  12/07/2011   left side   COLONOSCOPY WITH PROPOFOL N/A 07/30/2022   Procedure: COLONOSCOPY WITH PROPOFOL;  Surgeon: Ladene Artist, MD;  Location: [place];  Service: Gastroenterology;  Laterality: N/A;   EYE SURGERY     FOOT SURGERY Bilateral  HOT HEMOSTASIS N/A 07/30/2022   Procedure: HOT HEMOSTASIS (ARGON PLASMA COAGULATION/BICAP);  Surgeon: Ladene Artist, MD;  Location: [place];  Service: Gastroenterology;  Laterality: N/A;   MITRAL VALVE REPAIR N/A 04/19/2022   Procedure: MITRAL VALVE REPAIR WITH 30MM PHYSIO II ANNULOPLASTY RING;  Surgeon: Melrose Nakayama, MD;  Location: [place];  Service: Open Heart Surgery;  Laterality: N/A;   PERICARDIOCENTESIS N/A 05/10/2022   Procedure: PERICARDIOCENTESIS;  Surgeon: Jettie Booze, MD;  Location: [place] CV [facility];  Service: Cardiovascular;  Laterality: N/A;   POLYPECTOMY   07/30/2022   Procedure: POLYPECTOMY;  Surgeon: Ladene Artist, MD;  Location: [facility] ENDOSCOPY;  Service: Gastroenterology;;   RIGHT/LEFT HEART CATH AND CORONARY ANGIOGRAPHY N/A 02/12/2022   Procedure: RIGHT/LEFT HEART CATH AND CORONARY ANGIOGRAPHY;  Surgeon: Belva Crome, MD;  Location: [place] [facility];  Service: Cardiovascular;  Laterality: N/A;   TEE WITHOUT CARDIOVERSION N/A 02/03/2022   Procedure: TRANSESOPHAGEAL ECHOCARDIOGRAM (TEE);  Surgeon: Josue Hector, MD;  Location: [facility] ENDOSCOPY;  Service: Cardiovascular;  Laterality: N/A;   TEE WITHOUT CARDIOVERSION N/A 04/19/2022   Procedure: TRANSESOPHAGEAL ECHOCARDIOGRAM (TEE);  Surgeon: Melrose Nakayama, MD;  Location: [place];  Service: Open Heart Surgery;  Laterality: N/A;   TEE WITHOUT CARDIOVERSION N/A 07/28/2022   Procedure: TRANSESOPHAGEAL ECHOCARDIOGRAM (TEE);  Surgeon: Sanda Klein, MD;  Location: [place];  Service: Cardiovascular;  Laterality: N/A;   TRICUSPID VALVE REPLACEMENT N/A 04/19/2022   Procedure: TRICUSPID VALVE REPAIR WITH 32MM MC3 ANNULOPLASTY RING;  Surgeon: Melrose Nakayama, MD;  Location: [place];  Service: Open Heart Surgery;  Laterality: N/A;   Social History:  reports that she has been smoking cigarettes. She has a 8.75 pack-year smoking history. She has never used smokeless tobacco. She reports that she does not drink alcohol and does not use drugs.  Allergies  Allergen Reactions   Norco [Hydrocodone-Acetaminophen] Itching and Other (See Comments)    Tolerates Oxycodone    Family History  Problem Relation Age of Onset   Uterine cancer Mother    Alcohol abuse Father    Alcohol abuse Sister    Drug abuse Sister    Drug abuse Brother    Alcohol abuse Brother    Schizophrenia Son    Diabetes Other    Arthritis Other    Hypertension Other    Heart attack Son 48       Died suddenly    Prior to Admission medications   Medication Sig Start Date End Date Taking? Authorizing Provider   acetaminophen (TYLENOL) 500 MG tablet Take 1,000 mg by mouth every 6 (six) hours as needed for mild pain.    [provider]  albuterol (PROVENTIL HFA;VENTOLIN HFA) 108 (90 Base) MCG/ACT inhaler Inhale 2 puffs into the lungs every 6 (six) hours as needed for wheezing or shortness of breath.     [provider]  ARIPiprazole (ABILIFY) 5 MG tablet Take 5 mg by mouth daily. 07/20/22   [provider]  atorvastatin (LIPITOR) 20 MG tablet Take 20 mg by mouth daily.    [provider]  buPROPion (WELLBUTRIN XL) 300 MG 24 hr tablet Take 300 mg by mouth in the morning. 03/24/22   [provider]  dexlansoprazole (DEXILANT) 60 MG capsule Take 1 capsule by mouth daily. 07/26/22   [provider]  escitalopram (LEXAPRO) 10 MG tablet Take 10 mg by mouth every morning. 07/08/22   [provider]  furosemide (LASIX) 20 MG tablet Take 1 tablet (20  mg total) by mouth daily. Hold until you see your physician 08/04/22   Lorella Nimrod, MD  gabapentin (NEURONTIN) 600 MG tablet Take 0.5 tablets (300 mg total) by mouth 3 (three) times daily. 05/30/22   [name], [name], MD  linagliptin (TRADJENTA) 5 MG TABS tablet Take 1 tablet (5 mg total) by mouth in the morning. Hold until you see your physician 08/04/22   Lorella Nimrod, MD  losartan (COZAAR) 100 MG tablet Take 1 tablet (100 mg total) by mouth daily. Hold until you see your physician 08/04/22   Lorella Nimrod, MD  metoprolol tartrate (LOPRESSOR) 50 MG tablet Take 0.5 tablets (25 mg total) by mouth 2 (two) times daily. 08/04/22   Lorella Nimrod, MD  naloxone [facility]) nasal spray 4 mg/0.1 mL Place 1 spray into the nose as needed (accidental overdose).    [provider]  oxyCODONE (ROXICODONE) 15 MG immediate release tablet Take 0.5 tablets (7.5 mg total) by mouth every 12 (twelve) hours as needed for pain. 08/04/22   Lorella Nimrod, MD  PREMARIN 0.45 MG tablet Take 0.45 mg by mouth daily. 01/11/22   [provider]  spironolactone (ALDACTONE) 25 MG tablet Take 12.5 mg by mouth daily. 07/26/22   [provider]  traZODone (DESYREL) 100 MG tablet Take 1 tablet (100 mg total) by mouth at bedtime as needed for sleep. 08/04/22   Lorella Nimrod, MD  warfarin (COUMADIN) 5 MG tablet TAKE 1 TABLET BY MOUTH DAILY OR AS DIRECTED BY ANTICOAGULATION CLINIC. Patient taking differently: Take 5 mg by mouth daily. Take 1 tablet by mouth daily and 1.5 on sundays 07/02/22   Werner Lean, MD    Physical Exam: Vitals:   08/05/22 1315 08/05/22 1317 08/05/22 1555 08/05/22 1631  BP: (!) 171/138 (!) 171/138  (!) 162/124  Pulse: (!) 134 (!) 134  (!) 113  Resp: (!) 27 (!) 22  (!) 24  Temp:   98.5 F (36.9 C) 97.6 F (36.4 C)  TempSrc:   Oral Oral  SpO2: 96% 94%  94%  Weight:    92.4 kg  Height:    '5\' 11"'$  (1.803 m)   General:  Appears calm and comfortable and is in NAD. Mild dyspnea Eyes:  PERRL, EOMI, normal lids, iris ENT:  grossly normal hearing, lips & tongue, mmm; appropriate dentition Neck:  no LAD, masses or thyromegaly; no carotid bruits, +JVD Cardiovascular:  RRR, no m/r/g. No LE edema.  Respiratory:   course breath sounds and wheezing throughout and decreased air movement. No crackles. Dyspnea with some accessory muscle use.  Abdomen:  soft, NT, ND, NABS Back:   normal alignment, no CVAT Skin:  no rash or induration seen on limited exam Musculoskeletal:  grossly normal tone BUE/BLE, good ROM, no bony abnormality Lower extremity:  No LE edema.  Limited foot exam with no ulcerations.  2+ distal pulses. Psychiatric:  grossly normal mood and affect, speech fluent and appropriate, AOx3 Neurologic:  CN 2-12 grossly intact, moves all extremities in coordinated fashion, sensation intact   Radiological Exams on Admission: Independently reviewed - see discussion in A/P where applicable  DG Chest Port 1 View  Result Date: 08/05/2022 CLINICAL DATA:  Dyspnea EXAM: PORTABLE CHEST 1 VIEW  COMPARISON:  Chest radiograph 08/03/2022 FINDINGS: Median sternotomy wires and cardiac valve prostheses are unchanged. The cardiac silhouette is unchanged There is vascular congestion with possible mild pulmonary interstitial edema. There are patchy opacities in the right base which could reflect superimposed developing infection. There is no  significant pleural effusion. There is no pneumothorax The bones are stable.  The upper mediastinal contours are normal. IMPRESSION: 1. Vascular congestion with possible mild pulmonary interstitial edema. 2. Patchy opacities in the right base could reflect developing infection in the correct clinical setting. Upright PA and lateral radiographs with good inspiratory effort could be considered for better evaluation. Electronically Signed   By: Valetta Mole M.D.   On: 08/05/2022 12:24   EEG adult  Result Date: 08/04/2022 Lora Havens, MD     08/04/2022 10:34 AM Patient Name: [name] MRN: [id number] Epilepsy Attending: Lora Havens Referring Physician/Provider: Charlynne Cousins Date: 08/04/2022 Duration: 24.37 mins Patient history: 61 year old female with syncope.  EEG to evaluate for seizure. Level of alertness: Awake AEDs during EEG study: None Technical aspects: This EEG study was done with scalp electrodes positioned according to the 10-20 International system of electrode placement. Electrical activity was reviewed with band pass filter of 1-'70Hz'$ , sensitivity of 7 uV/mm, display speed of 70m/sec with a '60Hz'$  notched filter applied as appropriate. EEG data were recorded continuously and digitally stored.  Video monitoring was available and reviewed as appropriate. Description: The posterior dominant rhythm consists of 8-9 Hz activity of moderate voltage (25-35 uV) seen predominantly in posterior head regions, symmetric and reactive to eye opening and eye closing. Physiologic photic driving was not seen during photic stimulation.  Hyperventilation was not  performed.   IMPRESSION: This study is within normal limits. No seizures or epileptiform discharges were seen throughout the recording. A normal interictal EEG does not exclude nor support the diagnosis of epilepsy. Priyanka OBarbra Sarks   EKG: Independently reviewed.  NSR with rate 117; nonspecific ST changes with no evidence of acute ischemia Prolonged qt   Labs on Admission: I have personally reviewed the available labs and imaging studies at the time of the admission.  Pertinent labs:    hgb: 10.8,  troponin 69>pending,  bnp: 1742,   Assessment and Plan: Principal Problem:   Acute respiratory failure with hypoxia (HCC) Active Problems:   Acute on chronic systolic CHF (congestive heart failure) (HCC)   possible COPD with acute exacerbation (HCC)   Elevated troponin   Essential hypertension/HTN urgency    Paroxysmal atrial fibrillation (HCC)   S/P MVR/TVR  04/19/22   Type 2 diabetes mellitus without complication, without long-term current use of insulin (HCC)   Normocytic anemia   Hypercholesterolemia   Chronic pain syndrome   Anxiety and depression   Tobacco use   QT prolongation    Assessment and Plan: * Acute respiratory failure with hypoxia ([place] 61year old female presenting with 1 day history of shortness of breath found in respiratory distress on all fours gasping for air with oxygen in the upper 80s requiring bipap by EMS -admit to progressive -likely multifactorial due to CHF exacerbation, possible COPD exacerbation and questionable pneumonia although very low on differential. IF repeat 2V CXR and PCT are normal would stop abx.  -she has weaned down to 4-5L [place], but still is tachypneic. Does not want bipap placed back on -CTA negative for PE, on coumadin but recently just started back and INR subtherapeutic  -check vbg -initial troponin 69>143. Cardiology consulted in ED.  -treat above and wean as tolerated -supposedly discharged home on oxygen, but can not find this.    Acute on chronic systolic CHF (congestive heart failure) ([place] -based off findings on clinical exam including elevated JVD, orthopnea, findings on CXR: cardiomegaly/pulmonary edema, elevated BNP confirms that  patient is in acute on chronic systolic CHF exacerbation -admitted a few days ago with fluid resuscitation due to hypotension/bradycardia and AKI and lasix/spiro/ARB held  -echo and TEE just done. EF of 50-55%. MVR/TVR with no complications. (12/12/99 echo EF of 45-50%) -strict I/O and daily weights -IV diuresis-'40mg'$  q 24 hours  -check magnesium and follow electrolytes -telemetry  -start back losartan/spironolactone, monitor renal function. Continue lopressor  -cardiology consulted in ED    possible COPD with acute exacerbation ([place]) No diagnosis of COPD, but long standing history of smoking and presenting like exacerbation with wheezing, tightness and course rales -schedule duonebs -xoponex prn -give one dose of steroids today and re-eval tomorrow -given vanc/cefepime. Has 2/3 cardinal symptoms. Can likely de-escalate these once PCT and repeat 2V CXR results  -nicotine patch and encouraged smoking cesssation -IS to bedside -outpatient PFTs  Essential hypertension/HTN urgency  Likely secondary to medication adjustment. Her cozaar, spironolactone and lasix were held with AKI and hypotension Lopressor decreased to '25mg'$  BID Renal function okay,starting back losartan, spironolactone and on IV lasix Continue lopressor '25mg'$  BID IV parameters   Elevated troponin She has some chest tightness complaints, no pain. ekg with sinus tachy and prolonged QT ? Demand ischemia vs. HTN urgency  Continue to trend troponin Blood pressure control  Cardiology consulted   Paroxysmal atrial fibrillation (HCC) Coumadin recently held with GI bleed and re started yesterday Continue coumadin, pharmacy to dose  INR of 1.4 Continue lopressor '25mg'$  BID (this was decreased from '50mg'$  due to hypotension  and bradycardia on last admit)    S/P MVR/TVR  04/19/22 secondary to rheumatic heart valve had complication with cardiac tamponade underwent pericardiocentesis during early part of this year. Recent TEE done last week was unremarkable.  Type 2 diabetes mellitus without complication, without long-term current use of insulin (HCC) Well-controlled CBGs and, recent a1c of 6.0 Continued with sliding scale insulin and gabapentin while in hospital Hold tradjenta  Continue gabapentin   Normocytic anemia  Recently admitted for GI bleed and had underwent colonoscopy which showed a angiodysplasia underwent APC.  At the time patient also had polyp removal. Coumadin held and restarted yesterday hgb stable, continue to monitor   Hypercholesterolemia Continue lipitor daily   Chronic pain syndrome Continue oxycodone 7.'5mg'$  BID prn (decreaesd from '15mg'$ )   Anxiety and depression Continue wellbutrin and lexapro   Tobacco use ? COPD Nicotine patch Encouraged cessation   QT prolongation Optimize electrolytes-repelete magnesium Keep on telemetry Avoid qt prolonging drugs  Start lexapro/abilify tomorrow, hold if still prolonged  Repeat ekg in AM ]      Advance Care Planning:   Code Status: Full Code   Consults: cardiology   DVT Prophylaxis: coumadin   Family Communication: updated Kirk Ruths [phone]. Sister at bedside.   Severity of Illness: The appropriate patient status for this patient is INPATIENT. Inpatient status is judged to be reasonable and necessary in order to provide the required intensity of service to ensure the patient's safety. The patient's presenting symptoms, physical exam findings, and initial radiographic and laboratory data in the context of their chronic comorbidities is felt to place them at high risk for further clinical deterioration. Furthermore, it is not anticipated that the patient will be medically stable for discharge from the hospital within 2  midnights of admission.   * I certify that at the point of admission it is my clinical judgment that the patient will require inpatient hospital care spanning beyond 2 midnights from the point of admission due to  high intensity of service, high risk for further deterioration and high frequency of surveillance required.*  Author: Orma Flaming, MD 08/05/2022 7:10 PM  For on call review [email or web address].

## 2022-08-05 NOTE — Progress Notes (Signed)
ANTICOAGULATION CONSULT NOTE   Pharmacy Consult for Warfarin  Indication: atrial fibrillation   Allergies  Allergen Reactions   Norco [Hydrocodone-Acetaminophen] Itching and Other (See Comments)    Tolerates Oxycodone    Patient Measurements: Height: '5\' 11"'$  (180.3 cm) Weight: 92.4 kg (203 lb 11.3 oz) IBW/kg (Calculated) : 70.8 Vital Signs: Temp: 97.6 F (36.4 C) (08/31 1631) Temp Source: Oral (08/31 1631) BP: 162/124 (08/31 1631) Pulse Rate: 113 (08/31 1631)  Labs: Recent Labs    08/03/22 0047 08/03/22 0339 08/03/22 0713 08/04/22 0658 08/04/22 1116 08/05/22 1130 08/05/22 1430  HGB 9.2* 10.0* 9.1*  --  9.3* 10.8*  --   HCT 29.6* 31.9* 30.9*  --  29.0* 32.9*  --   PLT 223  --  217  --  203 270  --   LABPROT 15.8*  --   --  17.4*  --  17.1*  --   INR 1.3*  --   --  1.4*  --  1.4*  --   CREATININE 3.14*  --   --   --  1.79* 1.08*  --   TROPONINIHS 62* 49*  --   --   --  69* 143*     Estimated Creatinine Clearance: 67.7 mL/min (A) (by C-G formula based on SCr of 1.08 mg/dL (H)).   Medical History: Past Medical History:  Diagnosis Date   Acute pericardial effusion 05/17/2022   AKI (acute kidney injury) ([place]) 05/17/2022   Anxiety    Arthritis    Cervicalgia    CHF (congestive heart failure) (HCC)    Chondromalacia of patella    Chronic neck pain    Chronic pain syndrome    Depression    secondary to loss of her son at age 89   Diabetes mellitus    Diabetic neuropathy ([place])    DMII (diabetes mellitus, type 2) ([place])    Dysrhythmia    Enthesopathy of hip region    Fibromyalgia    GERD (gastroesophageal reflux disease)    Hep C w/o coma, chronic ([place])    Hepatitis C    diagnosed 2005   Hypertension    Insomnia    Insomnia    Low back pain    Lumbago    Mitral regurgitation    Obesity    Pain in joint, upper arm    Primary localized osteoarthrosis, lower leg    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   Substance abuse ([place])    sober since  2001   Tobacco abuse    Tricuspid regurgitation    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     Today's INR is 1.4,  Hg stable at 10.8, pltc wnl.  No bleeding reported.   PTA warfarin dose is '5mg'$  daily except 7.5 mg every Sunday.    Goal:  INR 2-3 Monitor platelets by anticoagulation protocol: Yes   Plan:  Warfarin 7.'5mg'$  tonight Daily PT/INR Monitor closely for any GI bleeding  Erin Hearing PharmD., BCPS Clinical Pharmacist 08/05/2022 4:38 PM

## 2022-08-05 NOTE — ED Notes (Signed)
ED TO INPATIENT HANDOFF REPORT  ED Nurse Name and Phone #: [phone]  S Name/Age/Gender [name] 61 y.o. female Room/Bed: 008C/008C  Code Status   Code Status: Prior  Home/SNF/Other Home Patient oriented to: self, place, time, and situation Is this baseline? Yes   Triage Complete: Triage complete  Chief Complaint Acute respiratory failure with hypoxia ([place]) [J96.01]  Triage Note SOB started last night. This morning worsening with CP. Recent dx of HF given lasix. 86% on 2L [place] baseline oxygen.   EMS rales in lungs, tachy 120, BP 190/132,   324 aspirin, 0.'4mg'$  x2 nitro  20g RFA   Allergies Allergies  Allergen Reactions   Norco [Hydrocodone-Acetaminophen] Itching and Other (See Comments)    Tolerates Oxycodone    Level of Care/Admitting Diagnosis ED Disposition     ED Disposition  Admit   Condition  --   [place]: [place] [[id number]]  Level of Care: Progressive [102]  Admit to Progressive based on following criteria: RESPIRATORY PROBLEMS hypoxemic/hypercapnic respiratory failure that is responsive to NIPPV (BiPAP) or High Flow Nasal Cannula (6-80 lpm). Frequent assessment/intervention, no > Q2 hrs < Q4 hrs, to maintain oxygenation and pulmonary hygiene.  May admit patient to [facility] or [facility] if equivalent level of care is available:: No  Covid Evaluation: Symptomatic Person Under Investigation (PUI) or recent exposure (last 10 days) *Testing Required*  Diagnosis: Acute respiratory failure with hypoxia [facility]) [[id number]]  Admitting Physician: Orma Flaming [[id number]]  Attending Physician: Orma Flaming [[id number]]  Certification:: I certify this patient will need inpatient services for at least 2 midnights  Estimated Length of Stay: 3          B Medical/Surgery History Past Medical History:  Diagnosis Date   Acute pericardial effusion 05/17/2022   AKI (acute kidney injury) ([place]) 05/17/2022   Anxiety     Arthritis    Cervicalgia    CHF (congestive heart failure) (HCC)    Chondromalacia of patella    Chronic neck pain    Chronic pain syndrome    Depression    secondary to loss of her son at age 42   Diabetes mellitus    Diabetic neuropathy ([place])    DMII (diabetes mellitus, type 2) ([place])    Dysrhythmia    Enthesopathy of hip region    Fibromyalgia    GERD (gastroesophageal reflux disease)    Hep C w/o coma, chronic ([place])    Hepatitis C    diagnosed 2005   Hypertension    Insomnia    Insomnia    Low back pain    Lumbago    Mitral regurgitation    Obesity    Pain in joint, upper arm    Primary localized osteoarthrosis, lower leg    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   Substance abuse ([place])    sober since 2001   Tobacco abuse    Tricuspid regurgitation    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   Past Surgical History:  Procedure Laterality Date   ABDOMINAL HYSTERECTOMY     at age 1, unknown reasons   CARPAL TUNNEL RELEASE  12/07/2011   left side   COLONOSCOPY WITH PROPOFOL N/A 07/30/2022   Procedure: COLONOSCOPY WITH PROPOFOL;  Surgeon: Ladene Artist, MD;  Location: [place];  Service: Gastroenterology;  Laterality: N/A;   EYE SURGERY     FOOT SURGERY Bilateral    HOT HEMOSTASIS N/A 07/30/2022   Procedure: HOT HEMOSTASIS (ARGON  PLASMA COAGULATION/BICAP);  Surgeon: Ladene Artist, MD;  Location: [place];  Service: Gastroenterology;  Laterality: N/A;   MITRAL VALVE REPAIR N/A 04/19/2022   Procedure: MITRAL VALVE REPAIR WITH 30MM PHYSIO II ANNULOPLASTY RING;  Surgeon: Melrose Nakayama, MD;  Location: [place];  Service: Open Heart Surgery;  Laterality: N/A;   PERICARDIOCENTESIS N/A 05/10/2022   Procedure: PERICARDIOCENTESIS;  Surgeon: Jettie Booze, MD;  Location: [place] [facility];  Service: Cardiovascular;  Laterality: N/A;   POLYPECTOMY  07/30/2022   Procedure: POLYPECTOMY;  Surgeon: Ladene Artist, MD;  Location: [facility] ENDOSCOPY;   Service: Gastroenterology;;   RIGHT/LEFT HEART CATH AND CORONARY ANGIOGRAPHY N/A 02/12/2022   Procedure: RIGHT/LEFT HEART CATH AND CORONARY ANGIOGRAPHY;  Surgeon: Belva Crome, MD;  Location: [place] [facility];  Service: Cardiovascular;  Laterality: N/A;   TEE WITHOUT CARDIOVERSION N/A 02/03/2022   Procedure: TRANSESOPHAGEAL ECHOCARDIOGRAM (TEE);  Surgeon: Josue Hector, MD;  [facility];  Service: Cardiovascular;  Laterality: N/A;   TEE WITHOUT CARDIOVERSION N/A 04/19/2022   Procedure: TRANSESOPHAGEAL ECHOCARDIOGRAM (TEE);  Surgeon: Melrose Nakayama, MD;  Location: [place];  Service: Open Heart Surgery;  Laterality: N/A;   TEE WITHOUT CARDIOVERSION N/A 07/28/2022   Procedure: TRANSESOPHAGEAL ECHOCARDIOGRAM (TEE);  Surgeon: Sanda Klein, MD;  Location: [place];  Service: Cardiovascular;  Laterality: N/A;   TRICUSPID VALVE REPLACEMENT N/A 04/19/2022   Procedure: TRICUSPID VALVE REPAIR WITH 32MM MC3 ANNULOPLASTY RING;  Surgeon: Melrose Nakayama, MD;  Location: [place];  Service: Open Heart Surgery;  Laterality: N/A;     A IV Location/Drains/Wounds Patient Lines/Drains/Airways Status     Active Line/Drains/Airways     Name Placement date Placement time Site Days   Peripheral IV 08/05/22 20 G Anterior;Right Forearm 08/05/22  --  Forearm  less than 1   External Urinary Catheter 08/05/22  1319  --  less than 1            Intake/Output Last 24 hours  Intake/Output Summary (Last 24 hours) at 08/05/2022 1449 Last data filed at 08/05/2022 1413 Gross per 24 hour  Intake 0 ml  Output --  Net 0 ml    Labs/Imaging Results for orders placed or performed during the hospital encounter of 08/05/22 (from the past 48 hour(s))  CBC     Status: Abnormal   Collection Time: 08/05/22 11:30 AM  Result Value Ref Range   WBC 7.0 4.0 - 10.5 K/uL   RBC 3.78 (L) 3.87 - 5.11 MIL/uL   Hemoglobin 10.8 (L) 12.0 - 15.0 g/dL   HCT 32.9 (L) 36.0 - 46.0 %   MCV 87.0 80.0 - 100.0 fL    MCH 28.6 26.0 - 34.0 pg   MCHC 32.8 30.0 - 36.0 g/dL   RDW 16.6 (H) 11.5 - 15.5 %   Platelets 270 150 - 400 K/uL   nRBC 0.0 0.0 - 0.2 %    Comment: Performed at [facility], [place] [address] [place], [place] [address]  Troponin I (High Sensitivity)     Status: Abnormal   Collection Time: 08/05/22 11:30 AM  Result Value Ref Range   Troponin I (High Sensitivity) 69 (H) <18 ng/L    Comment: RESULT CALLED TO, READ BACK BY AND VERIFIED WITH A.Teigan Manner RN 1250 08/05/22 MCCORMICK K (NOTE) Elevated high sensitivity troponin I (hsTnI) values and significant  changes across serial measurements may suggest ACS but many other  chronic and acute conditions are known to elevate hsTnI results.  Refer to  the "Links" section for chest pain algorithms and additional  guidance. Performed at [facility], [place] [address], [place], [place] [address]   Comprehensive metabolic panel     Status: Abnormal   Collection Time: 08/05/22 11:30 AM  Result Value Ref Range   Sodium 141 135 - 145 mmol/L   Potassium 4.3 3.5 - 5.1 mmol/L   Chloride 109 98 - 111 mmol/L   CO2 23 22 - 32 mmol/L   Glucose, Bld 102 (H) 70 - 99 mg/dL    Comment: Glucose reference range applies only to samples taken after fasting for at least 8 hours.   BUN 11 8 - 23 mg/dL   Creatinine, Ser 1.08 (H) 0.44 - 1.00 mg/dL   Calcium 8.5 (L) 8.9 - 10.3 mg/dL   Total Protein 6.5 6.5 - 8.1 g/dL   Albumin 3.4 (L) 3.5 - 5.0 g/dL   AST 26 15 - 41 U/L   ALT 13 0 - 44 U/L   Alkaline Phosphatase 75 38 - 126 U/L   Total Bilirubin 1.1 0.3 - 1.2 mg/dL   GFR, Estimated 58 (L) >60 mL/min    Comment: (NOTE) Calculated using the CKD-EPI Creatinine Equation (2021)    Anion gap 9 5 - 15    Comment: Performed at [facility] [address] [place], [place] [address]  Brain natriuretic peptide     Status: Abnormal   Collection Time: 08/05/22 11:30 AM  Result Value Ref Range   B Natriuretic Peptide 1,742.3 (H) 0.0 - 100.0 pg/mL     Comment: Performed at [facility] [address], [place], [place] [address]  Protime-INR     Status: Abnormal   Collection Time: 08/05/22 11:30 AM  Result Value Ref Range   Prothrombin Time 17.1 (H) 11.4 - 15.2 seconds   INR 1.4 (H) 0.8 - 1.2    Comment: (NOTE) INR goal varies based on device and disease states. Performed at [facility], [place] [address] [place],  [address]   SARS Coronavirus 2 by RT PCR (hospital order, performed in [facility] hospital lab) *cepheid single result test* Anterior Nasal Swab     Status: None   Collection Time: 08/05/22  1:08 PM   Specimen: Anterior Nasal Swab  Result Value Ref Range   SARS Coronavirus 2 by RT PCR NEGATIVE NEGATIVE    Comment: (NOTE) SARS-CoV-2 target nucleic acids are NOT DETECTED.  The SARS-CoV-2 RNA is generally detectable in upper and lower respiratory specimens during the acute phase of infection. The lowest concentration of SARS-CoV-2 viral copies this assay can detect is 250 copies / mL. A negative result does not preclude SARS-CoV-2 infection and should not be used as the sole basis for treatment or other patient management decisions.  A negative result may occur with improper specimen collection / handling, submission of specimen other than nasopharyngeal swab, presence of viral mutation(s) within the areas targeted by this assay, and inadequate number of viral copies (<250 copies / mL). A negative result must be combined with clinical observations, patient history, and epidemiological information.  Fact Sheet for Patients:   https://www.patel.info/  Fact Sheet for Healthcare Providers: https://hall.com/  This test is not yet approved or  cleared by the [place] FDA and has been authorized for detection and/or diagnosis of SARS-CoV-2 by FDA under an Emergency Use Authorization (EUA).  This EUA will remain in effect (meaning this test can be used) for  the duration of the COVID-19  declaration under Section 564(b)(1) of the Act, 21 U.S.C. section 360bbb-3(b)(1), unless the authorization is terminated or revoked sooner.  Performed at [facility], [place] [address], [place], [place] [address]    DG Chest Port 1 View  Result Date: 08/05/2022 CLINICAL DATA:  Dyspnea EXAM: PORTABLE CHEST 1 VIEW COMPARISON:  Chest radiograph 08/03/2022 FINDINGS: Median sternotomy wires and cardiac valve prostheses are unchanged. The cardiac silhouette is unchanged There is vascular congestion with possible mild pulmonary interstitial edema. There are patchy opacities in the right base which could reflect superimposed developing infection. There is no significant pleural effusion. There is no pneumothorax The bones are stable.  The upper mediastinal contours are normal. IMPRESSION: 1. Vascular congestion with possible mild pulmonary interstitial edema. 2. Patchy opacities in the right base could reflect developing infection in the correct clinical setting. Upright PA and lateral radiographs with good inspiratory effort could be considered for better evaluation. Electronically Signed   By: Valetta Mole M.D.   On: 08/05/2022 12:24   EEG adult  Result Date: 08/04/2022 Lora Havens, MD     08/04/2022 10:34 AM Patient Name: [name] MRN: [id number] Epilepsy Attending: Lora Havens Referring Physician/Provider: Charlynne Cousins Date: 08/04/2022 Duration: 24.37 mins Patient history: 61 year old female with syncope.  EEG to evaluate for seizure. Level of alertness: Awake AEDs during EEG study: None Technical aspects: This EEG study was done with scalp electrodes positioned according to the 10-20 International system of electrode placement. Electrical activity was reviewed with band pass filter of 1-'70Hz'$ , sensitivity of 7 uV/mm, display speed of 34m/sec with a '60Hz'$  notched filter applied as appropriate. EEG data were recorded continuously and digitally  stored.  Video monitoring was available and reviewed as appropriate. Description: The posterior dominant rhythm consists of 8-9 Hz activity of moderate voltage (25-35 uV) seen predominantly in posterior head regions, symmetric and reactive to eye opening and eye closing. Physiologic photic driving was not seen during photic stimulation.  Hyperventilation was not performed.   IMPRESSION: This study is within normal limits. No seizures or epileptiform discharges were seen throughout the recording. A normal interictal EEG does not exclude nor support the diagnosis of epilepsy. [place](From admission, onward)     Start     Ordered   08/06/22 0500  CBC  Daily at 5am,   R      08/05/22 1331   08/06/22 02637 Basic metabolic panel  Daily at 5am,   R      08/05/22 1331   08/05/22 1337  Lactic acid, plasma  STAT Now then every 3 hours,   R      08/05/22 1336   08/05/22 1337  Procalcitonin - Baseline  ONCE - URGENT,   URGENT        08/05/22 1336   08/05/22 1337  Magnesium  Once,   R        08/05/22 1336   08/05/22 1330  MRSA Next Gen by PCR, Nasal  Once,   URGENT        08/05/22 1331            Vitals/Pain Today's Vitals   08/05/22 1208 08/05/22 1245 08/05/22 1315 08/05/22 1317  BP: (!) 186/125 (!) 176/140 (!) 171/138 (!) 171/138  Pulse: (!) 110 (!) 126 (!) 134 (!) 134  Resp: (!) 22 (!) 22 (!) 27 (!) 22  Temp:      TempSrc:  SpO2: 98% 98% 96% 94%  Weight:      Height:      PainSc:        Isolation Precautions Airborne and Contact precautions  Medications Medications  ceFEPIme (MAXIPIME) 2 g in sodium chloride 0.9 % 100 mL IVPB (2 g Intravenous New Bag/Given 08/05/22 1448)  vancomycin (VANCOREADY) IVPB 2000 mg/400 mL (2,000 mg Intravenous New Bag/Given 08/05/22 1447)  vancomycin (VANCOREADY) IVPB 2000 mg/400 mL (has no administration in time range)  metoprolol tartrate (LOPRESSOR) tablet 25 mg (25 mg Oral Given 08/05/22 1433)  losartan  (COZAAR) tablet 100 mg (has no administration in time range)  metoprolol tartrate (LOPRESSOR) injection 5 mg (5 mg Intravenous Given 08/05/22 1415)  furosemide (LASIX) injection 40 mg (40 mg Intravenous Given 08/05/22 1316)    Mobility walks with person assist High fall risk   Focused Assessments Pulmonary Assessment Handoff:  Lung sounds:   O2 Device: Nasal Cannula O2 Flow Rate (L/min): 4 L/min    R Recommendations: See Admitting Provider Note  Report given to:   Additional Notes:

## 2022-08-05 NOTE — Assessment & Plan Note (Addendum)
61 year old female presenting with 1 day history of shortness of breath found in respiratory distress on all fours gasping for air with oxygen in the upper 80s requiring bipap by EMS -admit to progressive -likely multifactorial due to CHF exacerbation, possible COPD exacerbation and questionable pneumonia although very low on differential. IF repeat 2V CXR and PCT are normal would stop abx.  -she has weaned down to 4-5L [place], but still is tachypneic. Does not want bipap placed back on -CTA negative for PE, on coumadin but recently just started back and INR subtherapeutic  -check vbg -initial troponin 69>143. Cardiology consulted in ED.  -treat above and wean as tolerated -supposedly discharged home on oxygen, but can not find this.

## 2022-08-05 NOTE — Assessment & Plan Note (Signed)
secondary to rheumatic heart valve had complication with cardiac tamponade underwent pericardiocentesis during early part of this year. Recent TEE done last week was unremarkable.

## 2022-08-05 NOTE — Consult Note (Addendum)
Cardiology Consultation   Patient ID: [name] MRN: [id number]; DOB: 1961-11-17  Admit date: 08/05/2022 Date of Consult: 08/05/2022  PCP:  [facility] Cardiologist:  Werner Lean, MD        Patient Profile:   [name] is a 61 y.o. female with a hx of DM2, HTN, HFpEF, DM neuropathy, GERD, Hep C, s/p MVR/TV repair c/b cardiac tamponade w/p pericardiocentesis 05/10/2022, PAF, tob use, GIB w/ angiodysplasia s/p polyp removal, who is being seen 08/05/2022 for the evaluation of CHF at the request of Dr Rogers Blocker.  History of Present Illness:   [name] was admitted 08/17-08/19 w/ dizziness and near-syncope related to accidentally taking too much Cardizem.  She was admitted 08/21-08/26 with dizziness and syncope. There was concern for accidental O.D. oxycodone so dose decreased, metop continued, Hgb 7.5 s/p transfusion, 3 polyps with 2 nonbleeding colonic angiodysplasias treated with APC  08/28-08/30 with syncope, bradycardia. She passed out after standing for a time. SBP 80s, Hgb approx 10, AKI rx w/ IVF, Cortisol 6.5 >> 11.5. Pt hydrated, Lasix, spiro, losartan and metop held. Cr 1.79 on d/c. Still taking oxy 15 mg at a time and trazodone 300 mg qhs. D/c on home O2.  She came in 08/31 with SOB and CP, O2 sats 86% on 2 lpm and SBP > 200. Pt given ASA 324 mg, nitro x 2. Pt currently on BiPAP.   [name] does not have scales and is not able to weigh herself at home.  Because of her chronic pain issues in her neck neck and back, her activity level is poor at baseline.  She lives alone.  She feels that she would benefit from a home health nurse, and thinks she may have had 1 set up during a previous admission.  However, she was not home long enough to get a visit from them.  Her breathing just gradually became worse after discharge, she describes dyspnea on exertion, orthopnea, and PND.  She has not had chest pain  or palpitations.  She is tolerating the BiPAP well, hopes to get back on a nasal cannula soon.  She is not clear on how much fluid she should or should not drink, we will clarify this.  She needs a home health nurse to help her manage her medications, help her check her weights, and she will need a BMET after discharge.   Past Medical History:  Diagnosis Date   Acute pericardial effusion 05/17/2022   AKI (acute kidney injury) ([place]) 05/17/2022   Anxiety    Arthritis    Cervicalgia    CHF (congestive heart failure) (HCC)    Chondromalacia of patella    Chronic neck pain    Chronic pain syndrome    Depression    secondary to loss of her son at age 77   Diabetic neuropathy ([place])    DMII (diabetes mellitus, type 2) ([place])    Dysrhythmia    Enthesopathy of hip region    Fibromyalgia    GERD (gastroesophageal reflux disease)    Hep C w/o coma, chronic ([place])    Hepatitis C    diagnosed 2005   Hypertension    Insomnia    Low back pain    Lumbago    Mitral regurgitation    Obesity    Pain in joint, upper arm    Primary localized osteoarthrosis, lower leg    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   Substance  abuse [facility]    sober since 2001   Tobacco abuse    Tricuspid regurgitation    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    Past Surgical History:  Procedure Laterality Date   ABDOMINAL HYSTERECTOMY     at age 40, unknown reasons   CARPAL TUNNEL RELEASE  12/07/2011   left side   COLONOSCOPY WITH PROPOFOL N/A 07/30/2022   Procedure: COLONOSCOPY WITH PROPOFOL;  Surgeon: Ladene Artist, MD;  Location: [facility] ENDOSCOPY;  Service: Gastroenterology;  Laterality: N/A;   EYE SURGERY     FOOT SURGERY Bilateral    HOT HEMOSTASIS N/A 07/30/2022   Procedure: HOT HEMOSTASIS (ARGON PLASMA COAGULATION/BICAP);  Surgeon: Ladene Artist, MD;  Location: [place];  Service: Gastroenterology;  Laterality: N/A;   MITRAL VALVE REPAIR N/A 04/19/2022   Procedure: MITRAL VALVE  REPAIR WITH 30MM PHYSIO II ANNULOPLASTY RING;  Surgeon: Melrose Nakayama, MD;  Location: [place];  Service: Open Heart Surgery;  Laterality: N/A;   PERICARDIOCENTESIS N/A 05/10/2022   Procedure: PERICARDIOCENTESIS;  Surgeon: Jettie Booze, MD;  Location: [place] CV LAB;  Service: Cardiovascular;  Laterality: N/A;   POLYPECTOMY  07/30/2022   Procedure: POLYPECTOMY;  Surgeon: Ladene Artist, MD;  Location: [facility] ENDOSCOPY;  Service: Gastroenterology;;   RIGHT/LEFT HEART CATH AND CORONARY ANGIOGRAPHY N/A 02/12/2022   Procedure: RIGHT/LEFT HEART CATH AND CORONARY ANGIOGRAPHY;  Surgeon: Belva Crome, MD;  Location: [place] CV LAB;  Service: Cardiovascular;  Laterality: N/A;   TEE WITHOUT CARDIOVERSION N/A 02/03/2022   Procedure: TRANSESOPHAGEAL ECHOCARDIOGRAM (TEE);  Surgeon: Josue Hector, MD;  Location[facility] ENDOSCOPY;  Service: Cardiovascular;  Laterality: N/A;   TEE WITHOUT CARDIOVERSION N/A 04/19/2022   Procedure: TRANSESOPHAGEAL ECHOCARDIOGRAM (TEE);  Surgeon: Melrose Nakayama, MD;  Location: [place];  Service: Open Heart Surgery;  Laterality: N/A;   TEE WITHOUT CARDIOVERSION N/A 07/28/2022   Procedure: TRANSESOPHAGEAL ECHOCARDIOGRAM (TEE);  Surgeon: Sanda Klein, MD;  Location: [place];  Service: Cardiovascular;  Laterality: N/A;   TRICUSPID VALVE REPLACEMENT N/A 04/19/2022   Procedure: TRICUSPID VALVE REPAIR WITH 32MM MC3 ANNULOPLASTY RING;  Surgeon: Melrose Nakayama, MD;  Location: [place];  Service: Open Heart Surgery;  Laterality: N/A;     Home Medications:  Prior to Admission medications   Medication Sig Start Date End Date Taking? Authorizing Provider  acetaminophen (TYLENOL) 500 MG tablet Take 1,000 mg by mouth every 6 (six) hours as needed for mild pain.   Yes [provider]  albuterol (PROVENTIL HFA;VENTOLIN HFA) 108 (90 Base) MCG/ACT inhaler Inhale 2 puffs into the lungs every 6 (six) hours as needed for wheezing or shortness of breath.    Yes  [provider]  ARIPiprazole (ABILIFY) 5 MG tablet Take 5 mg by mouth daily. 07/20/22  Yes [provider]  atorvastatin (LIPITOR) 20 MG tablet Take 20 mg by mouth daily.   Yes [provider]  buPROPion (WELLBUTRIN XL) 300 MG 24 hr tablet Take 300 mg by mouth in the morning. 03/24/22  Yes [provider]  dexlansoprazole (DEXILANT) 60 MG capsule Take 1 capsule by mouth daily. 07/26/22  Yes [provider]  escitalopram (LEXAPRO) 10 MG tablet Take 10 mg by mouth every morning. 07/08/22  Yes [provider]  furosemide (LASIX) 20 MG tablet Take 1 tablet (20 mg total) by mouth daily. Hold until you see your physician 08/04/22  Yes Lorella Nimrod, MD  gabapentin (NEURONTIN) 600 MG tablet Take 0.5 tablets (300 mg total) by mouth  3 (three) times daily. 05/30/22  Yes [name], [name], MD  linagliptin (TRADJENTA) 5 MG TABS tablet Take 1 tablet (5 mg total) by mouth in the morning. Hold until you see your physician 08/04/22  Yes Lorella Nimrod, MD  losartan (COZAAR) 100 MG tablet Take 1 tablet (100 mg total) by mouth daily. Hold until you see your physician 08/04/22  Yes Lorella Nimrod, MD  metoprolol tartrate (LOPRESSOR) 50 MG tablet Take 0.5 tablets (25 mg total) by mouth 2 (two) times daily. 08/04/22  Yes Lorella Nimrod, MD  naloxone [facility]) nasal spray 4 mg/0.1 mL Place 1 spray into the nose as needed (accidental overdose).   Yes [provider]  oxyCODONE (ROXICODONE) 15 MG immediate release tablet Take 0.5 tablets (7.5 mg total) by mouth every 12 (twelve) hours as needed for pain. 08/04/22  Yes Lorella Nimrod, MD  PREMARIN 0.45 MG tablet Take 0.45 mg by mouth daily. 01/11/22  Yes [provider]  spironolactone (ALDACTONE) 25 MG tablet Take 12.5 mg by mouth daily. 07/26/22  Yes [provider]  traZODone (DESYREL) 100 MG tablet Take 1 tablet (100 mg total) by mouth at bedtime as needed for sleep. 08/04/22  Yes Lorella Nimrod, MD  warfarin  (COUMADIN) 5 MG tablet TAKE 1 TABLET BY MOUTH DAILY OR AS DIRECTED BY ANTICOAGULATION CLINIC. Patient taking differently: Take 5 mg by mouth daily. Take 1 tablet by mouth daily and 1.5 on sundays 07/02/22  Yes Chandrasekhar, Terisa Starr, MD    Inpatient Medications: Scheduled Meds:  ARIPiprazole  5 mg Oral Daily   atorvastatin  20 mg Oral Daily   [START ON 08/06/2022] buPROPion  300 mg Oral q AM   escitalopram  10 mg Oral q morning   [START ON 08/06/2022] estrogens (conjugated)  0.45 mg Oral Daily   [START ON 08/06/2022] furosemide  40 mg Intravenous Daily   gabapentin  300 mg Oral TID   insulin aspart  0-9 Units Subcutaneous TID WC   ipratropium-albuterol  3 mL Nebulization Q4H   losartan  100 mg Oral Daily   metoprolol tartrate  25 mg Oral BID   nicotine  14 mg Transdermal Daily   pantoprazole  40 mg Oral Daily   sodium chloride flush  3 mL Intravenous Q12H   spironolactone  12.5 mg Oral Daily   [START ON 08/06/2022] Warfarin - Pharmacist Dosing Inpatient   Does not apply q1600   Continuous Infusions:  sodium chloride     magnesium sulfate bolus IVPB 2 g (08/05/22 1800)   [START ON 08/06/2022] vancomycin     PRN Meds: sodium chloride, acetaminophen **OR** acetaminophen, levalbuterol, metoprolol tartrate, oxyCODONE, sodium chloride flush, traZODone  Allergies:    Allergies  Allergen Reactions   Norco [Hydrocodone-Acetaminophen] Itching and Other (See Comments)    Tolerates Oxycodone    Social History:   Social History   Socioeconomic History   Marital status: Single    Spouse name: Not on file   Number of children: 2   Years of education: Not on file   Highest education level: Not on file  Occupational History   Occupation: CNA    Comment: worked for 15 years   Occupation: disability  Tobacco Use   Smoking status: Every Day    Packs/day: 0.25    Years: 35.00    Total pack years: 8.75    Types: Cigarettes   Smokeless tobacco: Never   Tobacco comments:    4 cigarettes a  day  Vaping Use   Vaping  Use: Some days  Substance and Sexual Activity   Alcohol use: No   Drug use: No   Sexual activity: Not Currently    Comment: Celebate since 2000  Other Topics Concern   Not on file  Social History Narrative   Born and raised by mom until mom died when she was 14yo. After that she stayed with her aunt. Dad was married to someone else and was never around.  Pt has 1 brother and 1 sister and pt is the middle child. Pt had a very rough childhood. Never married. 2 kids one son died several  yrs ago and other son has Schizophrenia. Pt has graduated HS and has some college. Pt is on disability and is unemployed. 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       Financial assistance approved for 100% discount at [facility] and has [facility] card   [employer]  September 29, 2010 2:27 PM   Social Determinants of Health   Financial Resource Strain: Not on file  Food Insecurity: Not on file  Transportation Needs: Not on file  Physical Activity: Not on file  Stress: Not on file  Social Connections: Not on file  Intimate Partner Violence: Not on file    Family History:   Family History  Problem Relation Age of Onset   Uterine cancer Mother    Alcohol abuse Father    Alcohol abuse Sister    Drug abuse Sister    Drug abuse Brother    Alcohol abuse Brother    Schizophrenia Son    Diabetes Other    Arthritis Other    Hypertension Other    Heart attack Son 39       Died suddenly     ROS:  Please see the history of present illness.  All other ROS reviewed and negative.     Physical Exam/Data:   Vitals:   08/05/22 1315 08/05/22 1317 08/05/22 1555 08/05/22 1631  BP: (!) 171/138 (!) 171/138  (!) 162/124  Pulse: (!) 134 (!) 134  (!) 113  Resp: (!) 27 (!) 22  (!) 24  Temp:   98.5 F (36.9 C) 97.6 F (36.4 C)  TempSrc:   Oral Oral  SpO2: 96% 94%  94%  Weight:    92.4 kg  Height:    '5\' 11"'$  (1.803 m)    Intake/Output Summary (Last 24 hours) at 08/05/2022 1858 Last data filed  at 08/05/2022 1527 Gross per 24 hour  Intake 100 ml  Output --  Net 100 ml      08/05/2022    4:31 PM 08/05/2022   11:20 AM 08/04/2022   12:00 AM  Last 3 Weights  Weight (lbs) 203 lb 11.3 oz 216 lb 216 lb 3.2 oz  Weight (kg) 92.4 kg 97.977 kg 98.068 kg     Body mass index is 28.41 kg/m.  General:  Well nourished, well developed, +resp distress but tolerating BiPAP well HEENT: normal Neck: JVD 10 cm Vascular: No carotid bruits; Distal pulses 2+ bilaterally Cardiac:  normal S1, S2; RRR; soft murmur  Lungs:  clear to auscultation bilaterally, no wheezing, rhonchi or rales  Abd: soft, nontender, no hepatomegaly  Ext: no edema Musculoskeletal:  No deformities, BUE and BLE strength normal and equal Skin: warm and dry  Neuro:  CNs 2-12 intact, no focal abnormalities noted Psych:  Normal affect   EKG:  The EKG was personally reviewed and demonstrates:  ST, HR 117, some PACs and PVCs Telemetry:  Telemetry was personally reviewed and demonstrates:  SR  Relevant CV Studies:  ECHO TEE:  07/28/2022  1. Left ventricular ejection fraction, by estimation, is 60 to 65%. The  left ventricle has normal function. The left ventricle has no regional  wall motion abnormalities. There is mild concentric left ventricular  hypertrophy. Left ventricular diastolic function could not be evaluated.   2. Right ventricular systolic function is normal. The right ventricular  size is normal. There is moderately elevated pulmonary artery systolic  pressure. The estimated right ventricular systolic pressure is 07.3 mmHg.   3. Left atrial size was severely dilated. No left atrial/left atrial  appendage thrombus was detected. The LAA emptying velocity was 70 cm/s.   4. Right atrial size was mildly dilated.   5. The mitral valve has been repaired/replaced. Mild mitral valve  regurgitation. Mild mitral stenosis. The mean mitral valve gradient is 6.6  mmHg with average heart rate of 83 bpm. There is a prosthetic  annuloplasty  ring present in the mitral position. MV Procedure Date: 04/19/22.   6. The tricuspid valve is has been repaired/replaced. The tricuspid valve  is status post repair with an annuloplasty ring. Tricuspid valve  regurgitation is moderate.   7. The aortic valve is tricuspid. Aortic valve regurgitation is not  visualized. No aortic stenosis is present.   Comparison(s): A "double envelope" or "ghosting" artifact is seen during  CW interrogation of the mitral inflow. This may have led to overestimation  of the transmitral gradient on the previous study.   ECHO: 07/27/2022  1. Left ventricular ejection fraction, by estimation, is 50 to 55%. The  left ventricle has low normal function.   2. Left atrial size was severely dilated.   3. Right atrial size was moderately dilated.   4. S/p MV prosthetic annuloplasty ring placed 04/19/2022. mod-severe  mitral stenosis, MV mean gradient 15 mmHg, peak 27 mmhg at HR 87 bpm. Mild  mitral valve regurgitation. There is a present in the mitral position.  Procedure Date: 04/19/22.   5. 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  Tricuspid valve regurgitation is moderate.   6. Aortic valve regurgitation is not visualized.   7. There is severely elevated pulmonary artery systolic pressure.   Conclusion(s)/Recommendation(s): Compared to prior study, MV mean gradient  has increased. May be related to rate. Consider TEE/cardiac CT if  clinically indicated.   CARDIAC CATH: 02/12/2022 Mild pulmonary hypertension with mean PA pressure 31 mmHg.  Mean pulmonary wedge pressure 23 mmHg with a V wave 41 mmHg.  WHO group 2 pulmonary hypertension. Pulmonary vascular resistance 0.99 Woods units Left ventricular end diastolic pressure 35 mmHg, consistent with diastolic heart failure. Right dominant coronary anatomy.  Widely patent coronaries without obstructive disease. Previously documented severe mitral regurgitation.  Laboratory  Data:  High Sensitivity Troponin:   Recent Labs  Lab 07/26/22 1901 08/03/22 0047 08/03/22 0339 08/05/22 1130 08/05/22 1430  TROPONINIHS 7 62* 49* 69* 143*     Chemistry Recent Labs  Lab 08/03/22 0047 08/04/22 1116 08/05/22 1130 08/05/22 1430  NA 133* 135 141  --   K 4.1 4.4 4.3  --   CL 102 104 109  --   CO2 19* 23 23  --   GLUCOSE 84 113* 102*  --   BUN 26* 19 11  --   CREATININE 3.14* 1.79* 1.08*  --   CALCIUM 7.3* 7.6* 8.5*  --   MG  --   --   --  1.4*  GFRNONAA 16* 32* 58*  --   ANIONGAP '12 8 9  '$ --     Recent Labs  Lab 07/31/22 0633 08/03/22 0047 08/05/22 1130  PROT 7.0 5.9* 6.5  ALBUMIN 3.6 3.3* 3.4*  AST 43* 21 26  ALT '18 12 13  '$ ALKPHOS 75 57 75  BILITOT 1.0 0.6 1.1   Lipids No results for input(s): "CHOL", "TRIG", "HDL", "LABVLDL", "LDLCALC", "CHOLHDL" in the last 168 hours.  Hematology Recent Labs  Lab 08/03/22 0713 08/04/22 1116 08/05/22 1130  WBC 6.3 5.6 7.0  RBC 3.17* 3.20* 3.78*  HGB 9.1* 9.3* 10.8*  HCT 30.9* 29.0* 32.9*  MCV 97.5 90.6 87.0  MCH 28.7 29.1 28.6  MCHC 29.4* 32.1 32.8  RDW 17.3* 16.6* 16.6*  PLT 217 203 270   Thyroid  Recent Labs  Lab 08/03/22 0713  TSH 1.840    BNP Recent Labs  Lab 08/05/22 1130  BNP 1,742.3*    Lab Results  Component Value Date   INR 1.4 (H) 08/05/2022   INR 1.4 (H) 08/04/2022   INR 1.3 (H) 08/03/2022      Radiology/Studies:  DG Chest Port 1 View  Result Date: 08/05/2022 CLINICAL DATA:  Dyspnea EXAM: PORTABLE CHEST 1 VIEW COMPARISON:  Chest radiograph 08/03/2022 FINDINGS: Median sternotomy wires and cardiac valve prostheses are unchanged. The cardiac silhouette is unchanged There is vascular congestion with possible mild pulmonary interstitial edema. There are patchy opacities in the right base which could reflect superimposed developing infection. There is no significant pleural effusion. There is no pneumothorax The bones are stable.  The upper mediastinal contours are normal.  IMPRESSION: 1. Vascular congestion with possible mild pulmonary interstitial edema. 2. Patchy opacities in the right base could reflect developing infection in the correct clinical setting. Upright PA and lateral radiographs with good inspiratory effort could be considered for better evaluation. Electronically Signed   By: Valetta Mole M.D.   On: 08/05/2022 12:24   EEG adult  Result Date: 08/04/2022 Lora Havens, MD     08/04/2022 10:34 AM Patient Name: Hailee Hollick MRN: [id number] Epilepsy Attending: Lora Havens Referring Physician/Provider: Charlynne Cousins Date: 08/04/2022 Duration: 24.37 mins Patient history: 61 year old female with syncope.  EEG to evaluate for seizure. Level of alertness: Awake AEDs during EEG study: None Technical aspects: This EEG study was done with scalp electrodes positioned according to the 10-20 International system of electrode placement. Electrical activity was reviewed with band pass filter of 1-'70Hz'$ , sensitivity of 7 uV/mm, display speed of 65m/sec with a '60Hz'$  notched filter applied as appropriate. EEG data were recorded continuously and digitally stored.  Video monitoring was available and reviewed as appropriate. Description: The posterior dominant rhythm consists of 8-9 Hz activity of moderate voltage (25-35 uV) seen predominantly in posterior head regions, symmetric and reactive to eye opening and eye closing. Physiologic photic driving was not seen during photic stimulation.  Hyperventilation was not performed.   IMPRESSION: This study is within normal limits. No seizures or epileptiform discharges were seen throughout the recording. A normal interictal EEG does not exclude nor support the diagnosis of epilepsy. Priyanka OBarbra Sarks  CT HEAD WO CONTRAST (5MM)  Result Date: 08/03/2022 CLINICAL DATA:  Hypotensive.  Loss of consciousness. EXAM: CT HEAD WITHOUT CONTRAST TECHNIQUE: Contiguous axial images were obtained from the base of the skull through the  vertex without intravenous contrast. RADIATION DOSE REDUCTION: This exam was performed according to the departmental dose-optimization program which includes automated exposure control, adjustment of  the mA and/or kV according to patient size and/or use of iterative reconstruction technique. COMPARISON:  07/26/2022 FINDINGS: Brain: No evidence of acute infarction, hemorrhage, hydrocephalus, extra-axial collection or mass lesion/mass effect. Vascular: No hyperdense vessel or unexpected calcification. Skull: Normal. Negative for fracture or focal lesion. Sinuses/Orbits: No acute finding. Other: None. IMPRESSION: 1. No acute intracranial abnormalities. Electronically Signed   By: Kerby Moors M.D.   On: 08/03/2022 07:33   DG CHEST PORT 1 VIEW  Result Date: 08/03/2022 CLINICAL DATA:  Shortness of breath EXAM: PORTABLE CHEST 1 VIEW COMPARISON:  Eight days ago FINDINGS: Interstitial opacity with some Kerley lines. No visible effusion. Cardiopericardial enlargement. Prior median sternotomy for AV valve repairs. IMPRESSION: CHF pattern. Electronically Signed   By: Jorje Guild M.D.   On: 08/03/2022 06:40   DG Knee 1-2 Views Left  Result Date: 08/03/2022 CLINICAL DATA:  Syncope and fall EXAM: LEFT KNEE - 2 VIEW COMPARISON:  05/28/2022 FINDINGS: No evidence of fracture, dislocation, or joint effusion. Joint space narrowing with mild spurring at the medial compartment. IMPRESSION: No acute finding. 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 Electronically Signed   By: Jorje Guild M.D.   On: 08/03/2022 04:16   DG Knee 1-2 Views Right  Result Date: 08/03/2022 CLINICAL DATA:  Syncope and fall EXAM: RIGHT KNEE - 2 VIEW COMPARISON:  None Available. FINDINGS: No evidence of fracture, dislocation, or joint effusion. Minimal marginal spurring at the medial compartment. IMPRESSION: No acute finding. Electronically Signed   By: Jorje Guild M.D.   On: 08/03/2022 04:15     Assessment and Plan:   Acute on  chronic diastolic CHF: - Edema and possible infiltrate on her chest x-ray, will order PA and lateral chest x-ray to be done tomorrow after she has diuresed some to make sure it is all fluid -Because of her AKI, she has been off her diuretics for several days and has been hydrated - We will have to achieve a balance between keeping her dry and maintaining her renal function. -She got 1 dose of Lasix this afternoon, and it is scheduled for daily, starting tomorrow. -Since she is still on BiPAP, discuss changing Lasix to twice daily with MD.  2. AKI: - When she was admitted on 8/28, her creatinine was 3.14, had improved to 1.79 by discharge on 8/30 -Today, her creatinine is 1.08.  3.  Hypertension: - Systolic blood pressure has been 160s-180s this admission, it was as low as 79 systolic during her most recent admission -Now that she is off the Cardizem and her metoprolol dose has been decreased, her blood pressure is elevated - She is on losartan 100 mg daily and metoprolol 25 mg twice daily - Discuss with MD changing losartan to irbesartan and/or adding amlodipine or hydralazine.  However, care will have to be taken after discharge to make sure she is hydrated well enough  4. PAF -- Patient in sinus rhythm with PACs and PVCs on admission - INR subtherapeutic on admission, per pharmacy  Otherwise, per IM    Risk Assessment/Risk Scores:       New York Heart Association (NYHA) Functional Class NYHA Class IV  CHA2DS2-VASc Score = 4   This indicates a 4.8% annual risk of stroke. The patient's score is based upon: CHF History: 1 HTN History: 1 Diabetes History: 1 Stroke History: 0 Vascular Disease History: 0 Age Score: 0 Gender Score: 1   For questions or updates, please contact [place] Please consult [email or web address] for contact info under  Jonetta Speak, PA-C  08/05/2022 6:58 PM  Patient seen and examined, note reviewed with the signed Advanced Practice  Provider. I personally reviewed laboratory data, imaging studies and relevant notes. I independently examined the patient and formulated the important aspects of the plan. I have personally discussed the plan with the patient and/or family. Comments or changes to the note/plan are indicated below.  Clinical picture consistent with acute exacerbation of heart failure - has been started on diuretics will monitor. Monitor cr.    Berniece Salines DO, MS [facility] Attending Cardiologist [place]  [address] [place], [place] [address] [phone] Website: [email or web address]

## 2022-08-05 NOTE — Assessment & Plan Note (Addendum)
Recently admitted for GI bleed and had underwent colonoscopy which showed a angiodysplasia underwent APC.  At the time patient also had polyp removal. Coumadin held and restarted yesterday hgb stable, continue to monitor

## 2022-08-05 NOTE — ED Provider Notes (Signed)
[facility] EMERGENCY DEPARTMENT Provider Note   CSN: [id number] Arrival date & time: 08/05/22  1116     History  Chief Complaint  Patient presents with   Shortness of Breath    [name] is a 61 y.o. female.  HPI 61 year old female presents today with respiratory distress.  EMS reports that patient was in tripod position in severe respiratory distress with low sats on their arrival.  Patient was placed on BiPAP prehospital, she received aspirin and nitroglycerin.  She is complaining of chest pain and dyspnea.  She was extremely hypertensive with systolic blood pressure over 200 on EMS arrival.  Patient improved in route with the above treatment.  She states that the chest pain and dyspnea started this morning. She reports that she was discharged from the hospital yesterday Review of records reveal discharge summary as below      [name] is a 61 y.o. female with history of paroxysmal atrial fibrillation, diabetes mellitus, hypertension, mitral valve and tricuspid valve repair subsequent to which patient also had cardiac tamponade early part of this year had underwent pericardiocentesis recently admitted last week for GI bleed had colon polyps removed and also had angiodysplasia and APC presents to the ER with having lost consciousness.  Patient states after discharge about 3 days ago patient has been getting dizzy mostly on trying to exert.  Denies any chest pain productive cough nausea vomiting or diarrhea has not noticed any blood in the stools.  She states she was talking to her friends in the yard when she felt dizzy and passed out.   ED Course: In the ER patient was hypotensive with blood pressure systolic in the 10F.  Hemoglobin has dropped by 3 g but repeat hemoglobin has been stable at around 10.  Blood pressure improved with fluids EKG shows sinus bradycardia.  Patient admitted for further observation. She was also found to have AKI which  started improving pretty quickly with IV fluids. CT head was without any acute abnormality. Initial cortisol levels were little low at 6.5 and improved to 11.5 on recheck.  Hemoglobin remained stable.  No other obvious bleeding.  INR subtherapeutic.   Patient most likely was very dehydrated as she admitted not drinking much.  She continue to take her home medications which include Lasix, spironolactone, losartan and metoprolol. We held all of those medications and her renal function started improving.  Blood pressure remained within goal.  Mild tachycardia with heart rate trending up and then increased to low 90s we restarted metoprolol at a lower dose of 25 mg twice daily.  Patient need to hold her home dose of Lasix and losartan until she sees her primary care provider and have her kidney functions checked before resuming.  Primary care provider can restart at appropriate doses. She was also told to hold Tradjenta for the next few days, urine functions improving and creatinine was at 1.79 on discharge.    Home Medications Prior to Admission medications   Medication Sig Start Date End Date Taking? Authorizing Provider  acetaminophen (TYLENOL) 500 MG tablet Take 1,000 mg by mouth every 6 (six) hours as needed for mild pain.    [provider]  albuterol (PROVENTIL HFA;VENTOLIN HFA) 108 (90 Base) MCG/ACT inhaler Inhale 2 puffs into the lungs every 6 (six) hours as needed for wheezing or shortness of breath.     [provider]  ARIPiprazole (ABILIFY) 5 MG tablet Take 5 mg by mouth daily. 07/20/22   [provider]  atorvastatin (LIPITOR) 20 MG tablet Take 20 mg by mouth daily.    [provider]  buPROPion (WELLBUTRIN XL) 300 MG 24 hr tablet Take 300 mg by mouth in the morning. 03/24/22   [provider]  dexlansoprazole (DEXILANT) 60 MG capsule Take 1 capsule by mouth daily. 07/26/22   [provider]  escitalopram (LEXAPRO) 10 MG tablet Take 10 mg  by mouth every morning. 07/08/22   [provider]  furosemide (LASIX) 20 MG tablet Take 1 tablet (20 mg total) by mouth daily. Hold until you see your physician 08/04/22   Lorella Nimrod, MD  gabapentin (NEURONTIN) 600 MG tablet Take 0.5 tablets (300 mg total) by mouth 3 (three) times daily. 05/30/22   [name], [name], MD  linagliptin (TRADJENTA) 5 MG TABS tablet Take 1 tablet (5 mg total) by mouth in the morning. Hold until you see your physician 08/04/22   Lorella Nimrod, MD  losartan (COZAAR) 100 MG tablet Take 1 tablet (100 mg total) by mouth daily. Hold until you see your physician 08/04/22   Lorella Nimrod, MD  metoprolol tartrate (LOPRESSOR) 50 MG tablet Take 0.5 tablets (25 mg total) by mouth 2 (two) times daily. 08/04/22   Lorella Nimrod, MD  naloxone [facility]) nasal spray 4 mg/0.1 mL Place 1 spray into the nose as needed (accidental overdose).    [provider]  oxyCODONE (ROXICODONE) 15 MG immediate release tablet Take 0.5 tablets (7.5 mg total) by mouth every 12 (twelve) hours as needed for pain. 08/04/22   Lorella Nimrod, MD  PREMARIN 0.45 MG tablet Take 0.45 mg by mouth daily. 01/11/22   [provider]  spironolactone (ALDACTONE) 25 MG tablet Take 12.5 mg by mouth daily. 07/26/22   [provider]  traZODone (DESYREL) 100 MG tablet Take 1 tablet (100 mg total) by mouth at bedtime as needed for sleep. 08/04/22   Lorella Nimrod, MD  warfarin (COUMADIN) 5 MG tablet TAKE 1 TABLET BY MOUTH DAILY OR AS DIRECTED BY ANTICOAGULATION CLINIC. Patient taking differently: Take 5 mg by mouth daily. Take 1 tablet by mouth daily and 1.5 on sundays 07/02/22   Werner Lean, MD      Allergies    Norco [hydrocodone-acetaminophen]    Review of Systems   Review of Systems  Physical Exam Updated Vital Signs BP (!) 171/138   Pulse (!) 134   Temp 98.3 F (36.8 C) (Axillary)   Resp (!) 22   Ht 1.803 m ('5\' 11"'$ )   Wt 98 kg   LMP 12/06/1976   SpO2 94%   BMI 30.13  kg/m  Physical Exam Vitals and nursing note reviewed.  Constitutional:      General: She is in acute distress.     Appearance: She is well-developed. She is obese.  HENT:     Head: Normocephalic.  Eyes:     Pupils: Pupils are equal, round, and reactive to light.  Cardiovascular:     Rate and Rhythm: Regular rhythm. Tachycardia present.  Pulmonary:     Effort: Pulmonary effort is normal. Tachypnea present.     Breath sounds: Examination of the right-lower field reveals decreased breath sounds. Examination of the left-lower field reveals decreased breath sounds. Decreased breath sounds present.  Chest:     Chest wall: No mass.  Abdominal:     General: Bowel sounds are normal.     Palpations: Abdomen is soft.  Musculoskeletal:        General: Normal range of motion.  Cervical back: Normal range of motion.  Skin:    General: Skin is warm and dry.     Capillary Refill: Capillary refill takes less than 2 seconds.  Neurological:     General: No focal deficit present.     Mental Status: She is alert.  Psychiatric:        Mood and Affect: Mood normal.     ED Results / Procedures / Treatments   Labs (all labs ordered are listed, but only abnormal results are displayed) Labs Reviewed  CBC - Abnormal; Notable for the following components:      Result Value   RBC 3.78 (*)    Hemoglobin 10.8 (*)    HCT 32.9 (*)    RDW 16.6 (*)    All other components within normal limits  COMPREHENSIVE METABOLIC PANEL - Abnormal; Notable for the following components:   Glucose, Bld 102 (*)    Creatinine, Ser 1.08 (*)    Calcium 8.5 (*)    Albumin 3.4 (*)    GFR, Estimated 58 (*)    All other components within normal limits  BRAIN NATRIURETIC PEPTIDE - Abnormal; Notable for the following components:   B Natriuretic Peptide 1,742.3 (*)    All other components within normal limits  PROTIME-INR - Abnormal; Notable for the following components:   Prothrombin Time 17.1 (*)    INR 1.4 (*)     All other components within normal limits  TROPONIN I (HIGH SENSITIVITY) - Abnormal; Notable for the following components:   Troponin I (High Sensitivity) 69 (*)    All other components within normal limits  SARS CORONAVIRUS 2 BY RT PCR  MRSA NEXT GEN BY PCR, NASAL  LACTIC ACID, PLASMA  LACTIC ACID, PLASMA  PROCALCITONIN  MAGNESIUM  TROPONIN I (HIGH SENSITIVITY)    EKG EKG Interpretation  Date/Time:  Thursday August 05 2022 11:26:06 EDT Ventricular Rate:  117 PR Interval:  145 QRS Duration: 84 QT Interval:  367 QTC Calculation: 512 R Axis:   28 Text Interpretation: Sinus arrhythmia Paired ventricular premature complexes Probable left atrial enlargement Nonspecific T abnrm, anterolateral leads Prolonged QT interval Rate has increased from first prior EKG 2 days ago, T wave inversion has decreased although still present Confirmed by Pattricia Boss [id number]) on 08/05/2022 12:55:16 PM  Radiology DG Chest Port 1 View  Result Date: 08/05/2022 CLINICAL DATA:  Dyspnea EXAM: PORTABLE CHEST 1 VIEW COMPARISON:  Chest radiograph 08/03/2022 FINDINGS: Median sternotomy wires and cardiac valve prostheses are unchanged. The cardiac silhouette is unchanged There is vascular congestion with possible mild pulmonary interstitial edema. There are patchy opacities in the right base which could reflect superimposed developing infection. There is no significant pleural effusion. There is no pneumothorax The bones are stable.  The upper mediastinal contours are normal. IMPRESSION: 1. Vascular congestion with possible mild pulmonary interstitial edema. 2. Patchy opacities in the right base could reflect developing infection in the correct clinical setting. Upright PA and lateral radiographs with good inspiratory effort could be considered for better evaluation. Electronically Signed   By: Valetta Mole M.D.   On: 08/05/2022 12:24   EEG adult  Result Date: 08/04/2022 Lora Havens, MD     08/04/2022 10:34 AM  Patient Name: [name] MRN: [id number] Epilepsy Attending: Lora Havens Referring Physician/Provider: Charlynne Cousins Date: 08/04/2022 Duration: 24.37 mins Patient history: 61 year old female with syncope.  EEG to evaluate for seizure. Level of alertness: Awake AEDs during EEG study: None Technical aspects: This EEG study  was done with scalp electrodes positioned according to the 10-20 International system of electrode placement. Electrical activity was reviewed with band pass filter of 1-'70Hz'$ , sensitivity of 7 uV/mm, display speed of 12m/sec with a '60Hz'$  notched filter applied as appropriate. EEG data were recorded continuously and digitally stored.  Video monitoring was available and reviewed as appropriate. Description: The posterior dominant rhythm consists of 8-9 Hz activity of moderate voltage (25-35 uV) seen predominantly in posterior head regions, symmetric and reactive to eye opening and eye closing. Physiologic photic driving was not seen during photic stimulation.  Hyperventilation was not performed.   IMPRESSION: This study is within normal limits. No seizures or epileptiform discharges were seen throughout the recording. A normal interictal EEG does not exclude nor support the diagnosis of epilepsy. PLora Havens   Procedures Procedures    Medications Ordered in ED Medications  ceFEPIme (MAXIPIME) 2 g in sodium chloride 0.9 % 100 mL IVPB (has no administration in time range)  vancomycin (VANCOREADY) IVPB 2000 mg/400 mL (has no administration in time range)  vancomycin (VANCOREADY) IVPB 2000 mg/400 mL (has no administration in time range)  metoprolol tartrate (LOPRESSOR) tablet 25 mg (has no administration in time range)  losartan (COZAAR) tablet 100 mg (has no administration in time range)  furosemide (LASIX) injection 40 mg (40 mg Intravenous Given 08/05/22 1316)    ED Course/ Medical Decision Making/ A&P Clinical Course as of 08/05/22 1341  Thu Aug 05, 2022  1252  CBC reviewed interpreted and normal white blood cell count There is anemia but is actually improved from first prior with hemoglobin today of 10.8 reviewed platelets are reviewed and normal at 270,000 [DR]  10865Complete metabolic panel is reviewed and interpreted and significant for creatinine at 1.08 and calcium at 8.5 which is slightly decreased from baseline [DR]  1252 Troponin I (High Sensitivity)(!) Troponin is elevated today at 69 this is slightly elevated from first prior 2 days ago, but it was in the 60s on the one obtained just before they went in the 441s[DR]    Clinical Course User Index [DR] RPattricia Boss MD                           Medical Decision Making 61year old female history of paroxysmal A-fib, diabetes, hypertension,mitral and tricuspid valve repairs, who was discharged yesterday after being admitted with hypotension and GI bleeding, and syncope.  During admission she was found to have acute kidney injury and was treated with IV fluids and hypotension improved rapidly as well as creatinine.  Her Lasix, losartan, spironolactone, and metoprolol were held.  She was discharged home yesterday at 3 PM and has not taken any of her medications since then.  Today she presented in respiratory distress with hypertension.  She is on BiPAP.  She was given nitroglycerin prehospital and started on IV nitroglycerin here. She has some increased markings in her right base that could be consistent with pneumonia versus volume overload She is being given her dose of home Lasix Differential diagnosis includes but is not limited to infection including pneumonia,, increased markings noted on chest x-Alanys Godino.  Patient given IV antibiotics here to cover healthcare associated pneumonia Acute congestive heart failure, likely secondary to medication being held,-patient given Lasix and diuresed here in the emergency department Hypertensive urgency/emergency, again likely with rebound after having her  medication held, patient given IV nitro Acute coronary event including ACS, patient with elevated troponin although this  has been elevated during her last hospitalization, GI bleeding which does not appear to be an acute contributory etiology as hemoglobin is stable Patient transition from BiPAP to nasal cannula.  Oxygen saturations mid 90s on 4 L nasal cannula  Plan consult to cardiology, hospitalist for readmission  Amount and/or Complexity of Data Reviewed Labs: ordered. Decision-making details documented in ED Course. Radiology: ordered and independent interpretation performed. Decision-making details documented in ED Course. ECG/medicine tests: ordered. Decision-making details documented in ED Course. Discussion of management or test interpretation with external provider(s): Discussed with Trish, on-call for cardiology Discussed with Dr. Rogers Blocker on-call for hospitalist  Risk Prescription drug management. Parenteral controlled substances.  Critical Care Total time providing critical care: 45 minutes           Final Clinical Impression(s) / ED Diagnoses Final diagnoses:  Acute respiratory failure with hypoxia [facility])    Rx / DC Orders ED Discharge Orders     None         Pattricia Boss, MD 08/05/22 1341

## 2022-08-05 NOTE — Assessment & Plan Note (Signed)
Coumadin recently held with GI bleed and re started yesterday Continue coumadin, pharmacy to dose  INR of 1.4 Continue lopressor '25mg'$  BID (this was decreased from '50mg'$  due to hypotension and bradycardia on last admit)

## 2022-08-05 NOTE — Assessment & Plan Note (Addendum)
Well-controlled CBGs and, recent a1c of 6.0 Continued with sliding scale insulin and gabapentin while in hospital Hold tradjenta  Continue gabapentin

## 2022-08-06 ENCOUNTER — Inpatient Hospital Stay (HOSPITAL_COMMUNITY): Payer: Medicare Other

## 2022-08-06 ENCOUNTER — Ambulatory Visit: Payer: Medicare Other | Admitting: Internal Medicine

## 2022-08-06 DIAGNOSIS — K922 Gastrointestinal hemorrhage, unspecified: Secondary | ICD-10-CM

## 2022-08-06 DIAGNOSIS — Z720 Tobacco use: Secondary | ICD-10-CM

## 2022-08-06 DIAGNOSIS — F419 Anxiety disorder, unspecified: Secondary | ICD-10-CM

## 2022-08-06 DIAGNOSIS — D649 Anemia, unspecified: Secondary | ICD-10-CM

## 2022-08-06 DIAGNOSIS — E7800 Pure hypercholesterolemia, unspecified: Secondary | ICD-10-CM

## 2022-08-06 DIAGNOSIS — G894 Chronic pain syndrome: Secondary | ICD-10-CM

## 2022-08-06 DIAGNOSIS — F32A Depression, unspecified: Secondary | ICD-10-CM

## 2022-08-06 DIAGNOSIS — I161 Hypertensive emergency: Secondary | ICD-10-CM

## 2022-08-06 DIAGNOSIS — E119 Type 2 diabetes mellitus without complications: Secondary | ICD-10-CM

## 2022-08-06 DIAGNOSIS — R778 Other specified abnormalities of plasma proteins: Secondary | ICD-10-CM | POA: Diagnosis not present

## 2022-08-06 DIAGNOSIS — I5023 Acute on chronic systolic (congestive) heart failure: Secondary | ICD-10-CM | POA: Diagnosis not present

## 2022-08-06 DIAGNOSIS — R9431 Abnormal electrocardiogram [ECG] [EKG]: Secondary | ICD-10-CM

## 2022-08-06 DIAGNOSIS — Z9889 Other specified postprocedural states: Secondary | ICD-10-CM

## 2022-08-06 DIAGNOSIS — I480 Paroxysmal atrial fibrillation: Secondary | ICD-10-CM

## 2022-08-06 DIAGNOSIS — J9601 Acute respiratory failure with hypoxia: Secondary | ICD-10-CM | POA: Diagnosis not present

## 2022-08-06 LAB — PROTIME-INR
INR: 1.7 — ABNORMAL HIGH (ref 0.8–1.2)
Prothrombin Time: 20.1 seconds — ABNORMAL HIGH (ref 11.4–15.2)

## 2022-08-06 LAB — BASIC METABOLIC PANEL
Anion gap: 14 (ref 5–15)
Anion gap: 8 (ref 5–15)
BUN: 15 mg/dL (ref 8–23)
BUN: 16 mg/dL (ref 8–23)
CO2: 19 mmol/L — ABNORMAL LOW (ref 22–32)
CO2: 23 mmol/L (ref 22–32)
Calcium: 8.5 mg/dL — ABNORMAL LOW (ref 8.9–10.3)
Calcium: 8.7 mg/dL — ABNORMAL LOW (ref 8.9–10.3)
Chloride: 105 mmol/L (ref 98–111)
Chloride: 107 mmol/L (ref 98–111)
Creatinine, Ser: 1.13 mg/dL — ABNORMAL HIGH (ref 0.44–1.00)
Creatinine, Ser: 1.15 mg/dL — ABNORMAL HIGH (ref 0.44–1.00)
GFR, Estimated: 54 mL/min — ABNORMAL LOW (ref >60)
GFR, Estimated: 55 mL/min — ABNORMAL LOW (ref >60)
Glucose, Bld: 118 mg/dL — ABNORMAL HIGH (ref 70–99)
Glucose, Bld: 127 mg/dL — ABNORMAL HIGH (ref 70–99)
Potassium: 4.2 mmol/L (ref 3.5–5.1)
Potassium: 4.3 mmol/L (ref 3.5–5.1)
Sodium: 138 mmol/L (ref 135–145)
Sodium: 138 mmol/L (ref 135–145)

## 2022-08-06 LAB — HEMOGLOBIN AND HEMATOCRIT, BLOOD
HCT: 31.1 % — ABNORMAL LOW (ref 36.0–46.0)
HCT: 30.3 % — ABNORMAL LOW (ref 36.0–46.0)
HCT: 24.8 % — ABNORMAL LOW (ref 36.0–46.0)
Hemoglobin: 10.2 g/dL — ABNORMAL LOW (ref 12.0–15.0)
Hemoglobin: 10.1 g/dL — ABNORMAL LOW (ref 12.0–15.0)
Hemoglobin: 8.5 g/dL — ABNORMAL LOW (ref 12.0–15.0)

## 2022-08-06 LAB — GLUCOSE, CAPILLARY
Glucose-Capillary: 130 mg/dL — ABNORMAL HIGH (ref 70–99)
Glucose-Capillary: 113 mg/dL — ABNORMAL HIGH (ref 70–99)
Glucose-Capillary: 104 mg/dL — ABNORMAL HIGH (ref 70–99)

## 2022-08-06 LAB — CBC
HCT: 33.9 % — ABNORMAL LOW (ref 36.0–46.0)
Hemoglobin: 11.5 g/dL — ABNORMAL LOW (ref 12.0–15.0)
MCH: 28.7 pg (ref 26.0–34.0)
MCHC: 33.9 g/dL (ref 30.0–36.0)
MCV: 84.5 fL (ref 80.0–100.0)
Platelets: 274 10*3/uL (ref 150–400)
RBC: 4.01 MIL/uL (ref 3.87–5.11)
RDW: 16.5 % — ABNORMAL HIGH (ref 11.5–15.5)
WBC: 6.2 10*3/uL (ref 4.0–10.5)
nRBC: 0 % (ref 0.0–0.2)

## 2022-08-06 LAB — MAGNESIUM: Magnesium: 1.7 mg/dL (ref 1.7–2.4)

## 2022-08-06 LAB — HEPARIN LEVEL (UNFRACTIONATED): Heparin Unfractionated: 0.18 IU/mL — ABNORMAL LOW (ref 0.30–0.70)

## 2022-08-06 MED ORDER — HYDROCORTISONE (PERIANAL) 2.5 % EX CREA
TOPICAL_CREAM | Freq: Two times a day (BID) | CUTANEOUS | Status: DC
  Filled 2022-08-06 (×2): qty 28.35

## 2022-08-06 MED ORDER — HEPARIN (PORCINE) 25000 UT/250ML-% IV SOLN
1050.0000 [IU]/h | INTRAVENOUS | Status: DC
Start: 2022-08-06 — End: 2022-08-07
  Administered 2022-08-06: 1050 [IU]/h via INTRAVENOUS
  Filled 2022-08-06: qty 250

## 2022-08-06 MED ORDER — OXYCODONE HCL 5 MG PO TABS
15.0000 mg | ORAL_TABLET | Freq: Three times a day (TID) | ORAL | Status: DC | PRN
  Administered 2022-08-06 – 2022-08-10 (×10): 15 mg via ORAL
  Filled 2022-08-06 (×10): qty 3

## 2022-08-06 MED ORDER — WARFARIN SODIUM 2.5 MG PO TABS: 2.5000 mg | ORAL_TABLET | Freq: Once | ORAL | Status: DC

## 2022-08-06 MED ORDER — IPRATROPIUM-ALBUTEROL 0.5-2.5 (3) MG/3ML IN SOLN
3.0000 mL | Freq: Two times a day (BID) | RESPIRATORY_TRACT | Status: DC
  Administered 2022-08-06 – 2022-08-09 (×6): 3 mL via RESPIRATORY_TRACT
  Filled 2022-08-06 (×6): qty 3

## 2022-08-06 MED ORDER — AMLODIPINE BESYLATE 5 MG PO TABS
5.0000 mg | ORAL_TABLET | Freq: Every day | ORAL | Status: DC
  Administered 2022-08-06: 5 mg via ORAL
  Filled 2022-08-06 (×2): qty 1

## 2022-08-06 NOTE — Progress Notes (Signed)
ANTICOAGULATION CONSULT NOTE   Pharmacy Consult for Warfarin  Indication: atrial fibrillation   Allergies  Allergen Reactions   Norco [Hydrocodone-Acetaminophen] Itching and Other (See Comments)    Tolerates Oxycodone    Patient Measurements: Height: '5\' 11"'$  (180.3 cm) Weight: 90.7 kg (199 lb 14.4 oz) IBW/kg (Calculated) : 70.8 Vital Signs: Temp: 98.4 F (36.9 C) (09/01 0330) Temp Source: Oral (09/01 0330) BP: 149/122 (09/01 0330) Pulse Rate: 98 (09/01 0330)  Labs: Recent Labs    08/04/22 0658 08/04/22 1116 08/05/22 1130 08/05/22 1430 08/05/22 1812 08/05/22 2038 08/05/22 2339 08/06/22 0517 08/06/22 0940  HGB  --  9.3* 10.8*  --   --   --  11.5* 10.2*  --   HCT  --  29.0* 32.9*  --   --   --  33.9* 31.1*  --   PLT  --  203 270  --   --   --  274  --   --   LABPROT  --   --  17.1*  --  16.9*  --   --   --  20.1*  INR  --   --  1.4*  --  1.4*  --   --   --  1.7*  CREATININE  --  1.79* 1.08*  --   --   --  1.13*  --   --   TROPONINIHS   < >  --  69* 143* 191* 190*  --   --   --    < > = values in this interval not displayed.     Estimated Creatinine Clearance: 64.2 mL/min (A) (by C-G formula based on SCr of 1.13 mg/dL (H)).   Medical History: Past Medical History:  Diagnosis Date   Acute pericardial effusion 05/17/2022   AKI (acute kidney injury) ([place]) 05/17/2022   Anxiety    Arthritis    Cervicalgia    CHF (congestive heart failure) (H[facility])    Chondromalacia of patella    Chronic neck pain    Chronic pain syndrome    Depression    secondary to loss of her son at age 45   Diabetic neuropathy ([place])    DMII (diabetes mellitus, type 2) ([place])    Dysrhythmia    Enthesopathy of hip region    Fibromyalgia    GERD (gastroesophageal reflux disease)    Hep C w/o coma, chronic ([place])    Hepatitis C    diagnosed 2005   Hypertension    Insomnia    Low back pain    Lumbago    Mitral regurgitation    Obesity    Pain in joint, upper arm    Primary localized  osteoarthrosis, lower leg    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   Substance abuse ([place])    sober since 2001   Tobacco abuse    Tricuspid regurgitation    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  On warfarin prior to admission for h/o Afib. INR on admit = 1.4. Today INR is 1.7, trending up, still subtherapeutic.  RN reports blood in stool x 2 last night.  No bleeding noted this AM.   Hgb 10.2  stable, pltc wnl  Will give reduced warfarin dose today due to INR 1.4>1.7, BRBPR x2, recent GIB, colonoscopy)    PTA warfarin dose is '5mg'$  daily except 7.5  mg every Sunday.    Goal:  INR 2-3 Monitor platelets by anticoagulation protocol: Yes   Plan:  Warfarin 2.5 mg x1 (lower dose 2/2 INR 1.4>1.7, BRBPR x2, recent GIB, colonoscopy) Daily INR Monitor closely for any GI bleeding  Nicole Cella, RPh Clinical Pharmacist 08/06/2022 11:12 AM  Please check AMION for all [place] phone numbers After 10:00 PM, call [place] [phone]

## 2022-08-06 NOTE — Progress Notes (Signed)
ANTICOAGULATION CONSULT NOTE - Initial Consult  Pharmacy Consult for heparin gtt Indication: atrial fibrillation  Allergies  Allergen Reactions   Norco [Hydrocodone-Acetaminophen] Itching and Other (See Comments)    Tolerates Oxycodone    Patient Measurements: Height: '5\' 11"'$  (180.3 cm) Weight: 90.7 kg (199 lb 14.4 oz) IBW/kg (Calculated) : 70.8 Heparin Dosing Weight: 89.7 kg  Vital Signs: Temp: 98.7 F (37.1 C) (09/01 2029) Temp Source: Oral (09/01 2028) BP: 99/66 (09/01 2237) Pulse Rate: 93 (09/01 2237)  Labs: Recent Labs    08/04/22 0658 08/04/22 1116 08/05/22 1130 08/05/22 1430 08/05/22 1812 08/05/22 2038 08/05/22 2339 08/06/22 0517 08/06/22 0940 08/06/22 2118 08/06/22 2247  HGB  --  9.3* 10.8*  --   --   --  11.5* 10.2* 10.1* 8.5*  --   HCT  --  29.0* 32.9*  --   --   --  33.9* 31.1* 30.3* 24.8*  --   PLT  --  203 270  --   --   --  274  --   --   --   --   LABPROT  --   --  17.1*  --  16.9*  --   --   --  20.1*  --   --   INR  --   --  1.4*  --  1.4*  --   --   --  1.7*  --   --   HEPARINUNFRC  --   --   --   --   --   --   --   --   --   --  0.18*  CREATININE  --  1.79* 1.08*  --   --   --  1.13*  --  1.15*  --   --   TROPONINIHS   < >  --  69* 143* 191* 190*  --   --   --   --   --    < > = values in this interval not displayed.     Estimated Creatinine Clearance: 63.1 mL/min (A) (by C-G formula based on SCr of 1.15 mg/dL (H)).   Medical History: Past Medical History:  Diagnosis Date   Acute pericardial effusion 05/17/2022   AKI (acute kidney injury) ([place]) 05/17/2022   Anxiety    Arthritis    Cervicalgia    CHF (congestive heart failure) (HCC)    Chondromalacia of patella    Chronic neck pain    Chronic pain syndrome    Depression    secondary to loss of her son at age 65   Diabetic neuropathy ([place])    DMII (diabetes mellitus, type 2) ([place])    Dysrhythmia    Enthesopathy of hip region    Fibromyalgia    GERD (gastroesophageal reflux  disease)    Hep C w/o coma, chronic ([place])    Hepatitis C    diagnosed 2005   Hypertension    Insomnia    Low back pain    Lumbago    Mitral regurgitation    Obesity    Pain in joint, upper arm    Primary localized osteoarthrosis, lower leg    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   Tobacco abuse    Tricuspid regurgitation    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    Medications:  Scheduled:   amLODipine  5 mg Oral Daily   ARIPiprazole  5 mg Oral  Daily   atorvastatin  20 mg Oral Daily   buPROPion  300 mg Oral q AM   escitalopram  10 mg Oral q morning   estrogens (conjugated)  0.45 mg Oral Daily   furosemide  40 mg Intravenous Daily   gabapentin  300 mg Oral TID   hydrocortisone   Rectal BID   insulin aspart  0-9 Units Subcutaneous TID WC   ipratropium-albuterol  3 mL Nebulization BID   losartan  100 mg Oral Daily   metoprolol tartrate  25 mg Oral BID   nicotine  14 mg Transdermal Daily   pantoprazole  40 mg Oral Daily   sodium chloride flush  3 mL Intravenous Q12H   spironolactone  12.5 mg Oral Daily    Assessment: 61 yo F with PMH afib previously on warfarin therapy. Patient is now being transitioned to heparin gtt in setting of hematochezia (colonoscopy on 8/25 shows polyps and internal hemorrhoids). Spoke with MD Cyndia Skeeters: suspects blood in stool is related to colonoscopy procedure and pt remains hemodynamically stable with stable Hgb.   INR (9/1): 1.7 (goal 2-3) -- last dose of warfarin admin on 8/31  Hgb (9/1): 10.1>>8.5 --Spoke with overnight Dr. Marlowe Sax who aware of Hgb drop  Initial heparin level below goal: 0.18, no overt bleeding or infusion issues reported. Per Dr. Marlowe Sax she asked to leave infusion rate as is and follow up with CBC in the morning.   Goal of Therapy:  Heparin level 0.3-0.7 units/ml Monitor platelets by anticoagulation protocol: Yes   Plan:  No bolus per MD Continue heparin at  1050 units/hr - per Dr. Marlowe Sax Heparin Lvel and CBC later this morning  Monitor CBC, s/sx bleeding and resolution of hematochezia   Georga Bora, PharmD Clinical Pharmacist 08/07/2022 1:12 AM Please check AMION for all [place] numbers

## 2022-08-06 NOTE — Progress Notes (Signed)
Rounding Note    Patient Name: [name] Date of Encounter: 08/06/2022  [place] [place] Cardiologist: Werner Lean, MD   Subjective   Patient seen examined her bedside.  She open her eyes to voice.  Shortness of breath is improving she tells me.  Inpatient Medications    Scheduled Meds:  ARIPiprazole  5 mg Oral Daily   atorvastatin  20 mg Oral Daily   buPROPion  300 mg Oral q AM   escitalopram  10 mg Oral q morning   estrogens (conjugated)  0.45 mg Oral Daily   furosemide  40 mg Intravenous Daily   gabapentin  300 mg Oral TID   insulin aspart  0-9 Units Subcutaneous TID WC   ipratropium-albuterol  3 mL Nebulization BID   losartan  100 mg Oral Daily   metoprolol tartrate  25 mg Oral BID   nicotine  14 mg Transdermal Daily   pantoprazole  40 mg Oral Daily   sodium chloride flush  3 mL Intravenous Q12H   spironolactone  12.5 mg Oral Daily   Warfarin - Pharmacist Dosing Inpatient   Does not apply q1600   Continuous Infusions:  sodium chloride     vancomycin     PRN Meds: sodium chloride, acetaminophen **OR** acetaminophen, levalbuterol, metoprolol tartrate, oxyCODONE, sodium chloride flush, traZODone   Vital Signs    Vitals:   08/06/22 0023 08/06/22 0130 08/06/22 0330 08/06/22 0736  BP: (!) 146/107 (!) 145/100 (!) 149/122   Pulse: 99 98 98   Resp: '19 18 17   '$ Temp: 97.8 F (36.6 C)  98.4 F (36.9 C)   TempSrc: Oral  Oral   SpO2: 98% 96% 96% 100%  Weight:   90.7 kg   Height:        Intake/Output Summary (Last 24 hours) at 08/06/2022 0823 Last data filed at 08/06/2022 0130 Gross per 24 hour  Intake 500 ml  Output 630 ml  Net -130 ml      08/06/2022    3:30 AM 08/05/2022    4:31 PM 08/05/2022   11:20 AM  Last 3 Weights  Weight (lbs) 199 lb 14.4 oz 203 lb 11.3 oz 216 lb  Weight (kg) 90.674 kg 92.4 kg 97.977 kg      Telemetry    Sinus rhythm- Personally Reviewed  ECG    None today- Personally Reviewed  Physical Exam   GEN: No  acute distress.   Neck: No JVD Cardiac: RRR, no murmurs, rubs, or gallops.  Respiratory: Clear to auscultation bilaterally. GI: Soft, nontender, non-distended  MS: No edema; No deformity. Neuro:  Nonfocal  Psych: Normal affect   Labs    High Sensitivity Troponin:   Recent Labs  Lab 08/03/22 0339 08/05/22 1130 08/05/22 1430 08/05/22 1812 08/05/22 2038  TROPONINIHS 49* 69* 143* 191* 190*     Chemistry Recent Labs  Lab 07/31/22 8099 08/03/22 0047 08/04/22 1116 08/05/22 1130 08/05/22 1430 08/05/22 2339  NA 135 133* 135 141  --  138  K 4.4 4.1 4.4 4.3  --  4.2  CL 106 102 104 109  --  105  CO2 19* 19* 23 23  --  19*  GLUCOSE 106* 84 113* 102*  --  127*  BUN 12 26* 19 11  --  15  CREATININE 1.19* 3.14* 1.79* 1.08*  --  1.13*  CALCIUM 8.6* 7.3* 7.6* 8.5*  --  8.7*  MG  --   --   --   --  1.4*  1.7  PROT 7.0 5.9*  --  6.5  --   --   ALBUMIN 3.6 3.3*  --  3.4*  --   --   AST 43* 21  --  26  --   --   ALT 18 12  --  13  --   --   ALKPHOS 75 57  --  75  --   --   BILITOT 1.0 0.6  --  1.1  --   --   GFRNONAA 52* 16* 32* 58*  --  55*  ANIONGAP '10 12 8 9  '$ --  14    Lipids No results for input(s): "CHOL", "TRIG", "HDL", "LABVLDL", "LDLCALC", "CHOLHDL" in the last 168 hours.  Hematology Recent Labs  Lab 08/04/22 1116 08/05/22 1130 08/05/22 2339 08/06/22 0517  WBC 5.6 7.0 6.2  --   RBC 3.20* 3.78* 4.01  --   HGB 9.3* 10.8* 11.5* 10.2*  HCT 29.0* 32.9* 33.9* 31.1*  MCV 90.6 87.0 84.5  --   MCH 29.1 28.6 28.7  --   MCHC 32.1 32.8 33.9  --   RDW 16.6* 16.6* 16.5*  --   PLT 203 270 274  --    Thyroid  Recent Labs  Lab 08/03/22 0713  TSH 1.840    BNP Recent Labs  Lab 08/05/22 1130  BNP 1,742.3*    DDimer No results for input(s): "DDIMER" in the last 168 hours.   Radiology    DG Chest 2 View  Result Date: 08/06/2022 CLINICAL DATA:  Short of breath. EXAM: CHEST - 2 VIEW COMPARISON:  08/05/2022 FINDINGS: Previous median sternotomy and cardiac valve prosthesis.  Heart size appears normal. Small bilateral pleural effusions. Atelectasis noted in the right lung base. No interstitial edema or airspace opacities. No signs of pneumothorax. IMPRESSION: 1. Small bilateral pleural effusions with right base atelectasis. Electronically Signed   By: Kerby Moors M.D.   On: 08/06/2022 06:38   DG Chest Port 1 View  Result Date: 08/05/2022 CLINICAL DATA:  Dyspnea EXAM: PORTABLE CHEST 1 VIEW COMPARISON:  Chest radiograph 08/03/2022 FINDINGS: Median sternotomy wires and cardiac valve prostheses are unchanged. The cardiac silhouette is unchanged There is vascular congestion with possible mild pulmonary interstitial edema. There are patchy opacities in the right base which could reflect superimposed developing infection. There is no significant pleural effusion. There is no pneumothorax The bones are stable.  The upper mediastinal contours are normal. IMPRESSION: 1. Vascular congestion with possible mild pulmonary interstitial edema. 2. Patchy opacities in the right base could reflect developing infection in the correct clinical setting. Upright PA and lateral radiographs with good inspiratory effort could be considered for better evaluation. Electronically Signed   By: Valetta Mole M.D.   On: 08/05/2022 12:24   EEG adult  Result Date: 08/04/2022 Lora Havens, MD     08/04/2022 10:34 AM Patient Name: [name] MRN: [id number] Epilepsy Attending: Lora Havens Referring Physician/Provider: Charlynne Cousins Date: 08/04/2022 Duration: 24.37 mins Patient history: 61 year old female with syncope.  EEG to evaluate for seizure. Level of alertness: Awake AEDs during EEG study: None Technical aspects: This EEG study was done with scalp electrodes positioned according to the 10-20 International system of electrode placement. Electrical activity was reviewed with band pass filter of 1-'70Hz'$ , sensitivity of 7 uV/mm, display speed of 28m/sec with a '60Hz'$  notched filter applied as  appropriate. EEG data were recorded continuously and digitally stored.  Video monitoring was available and reviewed as appropriate. Description: The posterior  dominant rhythm consists of 8-9 Hz activity of moderate voltage (25-35 uV) seen predominantly in posterior head regions, symmetric and reactive to eye opening and eye closing. Physiologic photic driving was not seen during photic stimulation.  Hyperventilation was not performed.   IMPRESSION: This study is within normal limits. No seizures or epileptiform discharges were seen throughout the recording. A normal interictal EEG does not exclude nor support the diagnosis of epilepsy. Lora Havens    Cardiac Studies   ECHO TEE:  07/28/2022  1. Left ventricular ejection fraction, by estimation, is 60 to 65%. The  left ventricle has normal function. The left ventricle has no regional  wall motion abnormalities. There is mild concentric left ventricular  hypertrophy. Left ventricular diastolic function could not be evaluated.   2. Right ventricular systolic function is normal. The right ventricular  size is normal. There is moderately elevated pulmonary artery systolic  pressure. The estimated right ventricular systolic pressure is 17.6 mmHg.   3. Left atrial size was severely dilated. No left atrial/left atrial  appendage thrombus was detected. The LAA emptying velocity was 70 cm/s.   4. Right atrial size was mildly dilated.   5. The mitral valve has been repaired/replaced. Mild mitral valve  regurgitation. Mild mitral stenosis. The mean mitral valve gradient is 6.6  mmHg with average heart rate of 83 bpm. There is a prosthetic annuloplasty  ring present in the mitral position. MV Procedure Date: 04/19/22.   6. The tricuspid valve is has been repaired/replaced. The tricuspid valve  is status post repair with an annuloplasty ring. Tricuspid valve  regurgitation is moderate.   7. The aortic valve is tricuspid. Aortic valve regurgitation is not   visualized. No aortic stenosis is present.   Comparison(s): A "double envelope" or "ghosting" artifact is seen during  CW interrogation of the mitral inflow. This may have led to overestimation  of the transmitral gradient on the previous study.    ECHO: 07/27/2022  1. Left ventricular ejection fraction, by estimation, is 50 to 55%. The  left ventricle has low normal function.   2. Left atrial size was severely dilated.   3. Right atrial size was moderately dilated.   4. S/p MV prosthetic annuloplasty ring placed 04/19/2022. mod-severe  mitral stenosis, MV mean gradient 15 mmHg, peak 27 mmhg at HR 87 bpm. Mild  mitral valve regurgitation. There is a present in the mitral position.  Procedure Date: 04/19/22.   5. 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  Tricuspid valve regurgitation is moderate.   6. Aortic valve regurgitation is not visualized.   7. There is severely elevated pulmonary artery systolic pressure.   Conclusion(s)/Recommendation(s): Compared to prior study, MV mean gradient  has increased. May be related to rate. Consider TEE/cardiac CT if  clinically indicated.    CARDIAC CATH: 02/12/2022 Mild pulmonary hypertension with mean PA pressure 31 mmHg.  Mean pulmonary wedge pressure 23 mmHg with a V wave 41 mmHg.  WHO group 2 pulmonary hypertension. Pulmonary vascular resistance 0.99 Woods units Left ventricular end diastolic pressure 35 mmHg, consistent with diastolic heart failure. Right dominant coronary anatomy.  Widely patent coronaries without obstructive disease. Previously documented severe mitral regurgitation.    Patient Profile     61 y.o. female  with a hx of DM2, HTN, HFpEF, DM neuropathy, GERD, Hep C, s/p MVR/TV repair c/b cardiac tamponade w/p pericardiocentesis 05/10/2022, PAF, tob use, GIB w/ angiodysplasia s/p polyp removal.  Assessment & Plan  Acute on chronic diastolic heart failure Acute kidney  injury Hypertension Paroxysmal atrial fibrillation   Clinically she appears to be improving.  Not quite sure if her ins and outs that was reported for the last 24 hours is accurate based on patient recollection of urine output.  Would recommend continuing with intravenous Lasix.  Please continue to monitor input and output.  Restrict fluid intake to less than 1200 mL.  Please to monitor daily weights. Kidney function slightly bumped up we will continue to monitor this as well.  She has been titrated off BiPAP which is good she is on nasal cannula today.  Her blood pressure is elevated.  We will add amlodipine 5 mg daily to her regimen.  Paroxysmal atrial fibrillation-she is in sinus rhythm continue her metoprolol as well as her apixaban.     For questions or updates, please contact [place] Please consult [email or web address] for contact info under        Signed, Berniece Salines, DO  08/06/2022, 8:23 AM

## 2022-08-06 NOTE — Progress Notes (Signed)
ANTICOAGULATION CONSULT NOTE - Initial Consult  Pharmacy Consult for heparin gtt Indication: atrial fibrillation  Allergies  Allergen Reactions   Norco [Hydrocodone-Acetaminophen] Itching and Other (See Comments)    Tolerates Oxycodone    Patient Measurements: Height: '5\' 11"'$  (180.3 cm) Weight: 90.7 kg (199 lb 14.4 oz) IBW/kg (Calculated) : 70.8 Heparin Dosing Weight: 89.7 kg  Vital Signs: Temp: 98 F (36.7 C) (09/01 1154) Temp Source: Oral (09/01 1154) BP: 116/98 (09/01 1154)  Labs: Recent Labs    08/04/22 0658 08/04/22 1116 08/05/22 1130 08/05/22 1430 08/05/22 1812 08/05/22 2038 08/05/22 2339 08/06/22 0517 08/06/22 0940  HGB  --  9.3* 10.8*  --   --   --  11.5* 10.2* 10.1*  HCT  --  29.0* 32.9*  --   --   --  33.9* 31.1* 30.3*  PLT  --  203 270  --   --   --  274  --   --   LABPROT  --   --  17.1*  --  16.9*  --   --   --  20.1*  INR  --   --  1.4*  --  1.4*  --   --   --  1.7*  CREATININE  --  1.79* 1.08*  --   --   --  1.13*  --  1.15*  TROPONINIHS   < >  --  69* 143* 191* 190*  --   --   --    < > = values in this interval not displayed.    Estimated Creatinine Clearance: 63.1 mL/min (A) (by C-G formula based on SCr of 1.15 mg/dL (H)).   Medical History: Past Medical History:  Diagnosis Date   Acute pericardial effusion 05/17/2022   AKI (acute kidney injury) ([place]) 05/17/2022   Anxiety    Arthritis    Cervicalgia    CHF (congestive heart failure) (HCC)    Chondromalacia of patella    Chronic neck pain    Chronic pain syndrome    Depression    secondary to loss of her son at age 1   Diabetic neuropathy ([place])    DMII (diabetes mellitus, type 2) ([place])    Dysrhythmia    Enthesopathy of hip region    Fibromyalgia    GERD (gastroesophageal reflux disease)    Hep C w/o coma, chronic ([place])    Hepatitis C    diagnosed 2005   Hypertension    Insomnia    Low back pain    Lumbago    Mitral regurgitation    Obesity    Pain in joint, upper arm     Primary localized osteoarthrosis, lower leg    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   Substance abuse ([place])    sober since 2001   Tobacco abuse    Tricuspid regurgitation    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    Medications:  Scheduled:   amLODipine  5 mg Oral Daily   ARIPiprazole  5 mg Oral Daily   atorvastatin  20 mg Oral Daily   buPROPion  300 mg Oral q AM   escitalopram  10 mg Oral q morning   estrogens (conjugated)  0.45 mg Oral Daily   furosemide  40 mg Intravenous Daily   gabapentin  300 mg Oral TID   hydrocortisone   Rectal BID   insulin aspart  0-9 Units Subcutaneous TID WC   ipratropium-albuterol  3 mL Nebulization BID   losartan  100 mg Oral Daily   metoprolol tartrate  25 mg Oral BID   nicotine  14 mg Transdermal Daily   pantoprazole  40 mg Oral Daily   sodium chloride flush  3 mL Intravenous Q12H   spironolactone  12.5 mg Oral Daily    Assessment: 61 yo F with PMH afib previously on warfarin therapy. Patient is now being transitioned to heparin gtt in setting of hematochezia (colonoscopy on 8/25 shows polyps and internal hemorrhoids). Spoke with MD Cyndia Skeeters: suspects blood in stool is related to colonoscopy procedure and pt remains hemodynamically stable with stable Hgb.   INR (9/1): 1.7 (goal 2-3) -- last dose of warfarin admin on 8/31  Hgb (9/1): 10.1 (stable)  Goal of Therapy:  Heparin level 0.3-0.7 units/ml Monitor platelets by anticoagulation protocol: Yes   Plan:  No bolus per MD Start heparin at 1050 units/hr  HL in 6hrs at 2300  Daily HL  Monitor CBC, s/sx bleeding and resolution of hematochezia   Wilson Singer, PharmD Clinical Pharmacist 08/06/2022 3:45 PM

## 2022-08-06 NOTE — TOC Initial Note (Addendum)
Transition of Care [facility]) - Initial/Assessment Note    Patient Details  Name: [name] MRN: [id number] Date of Birth: [birth year]  Transition of [facility]) CM/SW Contact:    Zenon Mayo, RN Phone Number: 08/06/2022, 2:16 PM  Clinical Narrative:                 Patient is from home alone, just recently discharged home with home oxygen and [facility] for med management. Etank provided on discharged, patient verbalized understanding that [employer] to call for set up.  NCM spoke with patient to eval readmit risk, per conversation with patient, she explains she decided she would sit on her porch without the oxygen while waiting for [employer], she drank half of 40 oz of beer, had an event.  NCM spoke with [employer] to follow up on home oxygen, confirmed that they tried to deliver the oxygen  on 8/30 day of dc but was unable to enter thru apartment complex gate, they attempted to call patient and did not get an answer.  [employer] called patient back again and she states she is on her way to the hospital.  [employer] called phone number [phone] to contact patient.  Patient states she had [employer] contact information on her dc paperwork but she does not know why she did not call them.  Patient states her sister will transport her home at dc.  TOC following.       Expected Discharge Plan: [place] Barriers to Discharge: Continued Medical Work up   Patient Goals and CMS Choice Patient states their goals for this hospitalization and ongoing recovery are:: return home CMS Medicare.gov Compare Post Acute Care list provided to:: Patient Choice offered to / list presented to : Patient  Expected Discharge Plan and Services Expected Discharge Plan: [place] In-house Referral: NA Discharge Planning Services: CM Consult Post Acute Care Choice: [place] arrangements for the past 2 months: Single Family Home                   DME Agency: NA       HH Arranged:  RN, Disease Management [place] Agency: [place] Date [place]: 08/04/22 Time HH Agency Contacted: 1518 Representative spoke with at [place]: Malachy Mood  Prior Living Arrangements/Services Living arrangements for the past 2 months: [place] with:: Self Patient language and need for interpreter reviewed:: Yes        Need for Family Participation in Patient Care: No (Comment) Care giver support system in place?: No (comment) Current home services: DME, Home RN (home oxygen [employer] 2 liters, [facility] HHRN) Criminal Activity/Legal Involvement Pertinent to Current Situation/Hospitalization: No - Comment as needed  Activities of Daily Living      Permission Sought/Granted                  Emotional Assessment Appearance:: Appears stated age Attitude/Demeanor/Rapport: Engaged Affect (typically observed): Appropriate Orientation: : Oriented to Self, Oriented to Place, Oriented to  Time, Oriented to Situation Alcohol / Substance Use: Not Applicable Psych Involvement: No (comment)  Admission diagnosis:  Acute respiratory failure with hypoxia ([place]) [J96.01] Patient Active Problem List   Diagnosis Date Noted   Acute respiratory failure with hypoxia ([place]) 08/05/2022   Acute on chronic systolic CHF (congestive heart failure) ([place]) 08/05/2022   Elevated troponin 08/05/2022   Hypertensive emergency 08/05/2022   possible COPD with acute exacerbation ([place]) 08/05/2022   Anxiety and  depression 08/05/2022   QT prolongation 08/05/2022   Syncope 08/03/2022   Benign neoplasm of ascending colon    AVM (arteriovenous malformation) of colon without hemorrhage    Benign neoplasm of cecum    Benign neoplasm of transverse colon    GI bleed 07/29/2022   Dizziness 07/26/2022   Normocytic anemia 07/23/2022   Bradycardia 07/22/2022   Pain due to onychomycosis of toenails of both feet 06/23/2022   Encounter for therapeutic drug monitoring 06/11/2022    Atrial fibrillation ([place]) 11/91/4782   Chronic systolic CHF (congestive heart failure) ([place]) 05/29/2022   Acute encephalopathy 05/29/2022   Hypomagnesemia 05/24/2022   Acute pericardial effusion s/p pericardiocentesis 05/10/22 05/17/2022   Long term current use of anticoagulant therapy 05/17/2022   S/P MVR/TVR  04/19/22 05/17/2022   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 05/17/2022   Acute kidney injury (nontraumatic) (HCC) 05/17/2022   PAF (paroxysmal atrial fibrillation) (HCC)    Atrial flutter with rapid ventricular response (HCC)    Pericardial tamponade 05/10/2022   Hypotension 04/05/2022   Severe tricuspid regurgitation 04/05/2022   Severe mitral regurgitation 02/11/2022   DOE (dyspnea on exertion) 12/30/2021   Pain of left hand 01/01/2021   Nasal vestibulitis 04/25/2020   FH: CHF (congestive heart failure) 04/05/2020   Acquired hallux interphalangeus 02/26/2020   Hallux rigidus 02/26/2020   Retained orthopedic hardware 02/26/2020   Mechanical complication associated with orthopedic device ([place]) 08/31/2019   Hypercholesterolemia 08/17/2019   Lateral epicondylitis of right elbow 03/31/2018   Arthritis of knee 03/09/2018   Arthritis of foot, degenerative 02/28/2018   Hallux valgus (acquired), left foot 02/28/2018   Long term current use of oral hypoglycemic drug 01/16/2018   Pharyngeal dysphagia 12/21/2017   Left thyroid nodule 12/21/2017   History of colon polyps 09/21/2017   History of atrial fibrillation 08/23/2017   GERD (gastroesophageal reflux disease) 08/23/2017   Presbyopia of both eyes 07/15/2017   Nuclear sclerotic cataract of both eyes 07/15/2017   Dry eye syndrome of both lacrimal glands 07/15/2017   Acquired trigger finger of left ring finger 05/04/2017   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 01/17/2017   Postural dizziness with presyncope 12/30/2016   Paroxysmal atrial fibrillation ([place]) 12/30/2016   Osteoarthritis 12/30/2016   NSAID long-term use  12/30/2016   Anemia, blood loss 12/30/2016   DDD (degenerative disc disease), lumbar 11/26/2015   Facet syndrome, lumbar 11/26/2015   Greater trochanteric bursitis 11/26/2015   Diabetic neuropathy ([place]) 11/26/2015   Insomnia 11/11/2015   PTSD (post-traumatic stress disorder) 11/11/2015   GAD (generalized anxiety disorder) 11/11/2015   Severe episode of recurrent major depressive disorder, without psychotic features ([place]) 11/11/2015   Depression 11/11/2015   Obesity 06/27/2012   Tobacco use 06/27/2012   Sacroiliac joint dysfunction 05/18/2012   Right low back pain 04/19/2012   Cervical neck pain with evidence of disc disease 02/11/2012   Trochanteric bursitis of right hip 02/11/2012   Hematochezia 04/29/2011   Plantar fasciitis 03/11/2011   INSOMNIA 01/18/2008   Chronic pain syndrome 12/15/2007   DIABETIC PERIPHERAL NEUROPATHY 05/08/2007   HEPATITIS C 02/27/2007   Type 2 diabetes mellitus without complication, without long-term current use of insulin ([place]) 02/27/2007   DEPRESSION 02/27/2007   Hypertension associated with diabetes ([place]) 02/27/2007   PCP:  [facility]:   [facility], [place] [address] Phone: [phone] Fax: [place], [place]  [address] [place] [address] Phone: [phone] Fax: [phone]  [id number] [place] [place]Phone: [phone]: [phone] [facility] [address]Phone: [phone]: [phone]    Social Determinants of Health (SDOH) Interventions    Readmission Risk Interventions    08/06/2022    2:13 PM  Readmission Risk Prevention Plan  Transportation Screening Complete  Medication  Review ([place] Complete  PCP or Specialist appointment within 3-5 days of discharge Complete  HRI or HEast [place]Complete  SW Recovery Care/Counseling Consult Complete  PCasmaliaNot Applicable

## 2022-08-06 NOTE — Progress Notes (Addendum)
PROGRESS NOTE  [name] KKX:[id number] DOB: March 26, 1961   PCP: [facility] [place]  Patient is from: Home.  Lives alone.  Uses a walker to ambulate.  Has a wheelchair as well.  DOA: 08/05/2022 LOS: 1  Chief complaints Chief Complaint  Patient presents with   Shortness of Breath     Brief Narrative / Interim history: 61 year old F with PMH of systolic CHF, paroxysmal A-fib on warfarin, DM-2, mitral valve and tricuspid valve repair, HTN, recent hospitalization on 8/21 for GI bleed on 8/28 for syncope/dehydration returning with an acute dyspnea, orthopnea, cough, chest heaviness and elevated BP, and admitted for acute respiratory failure with hypoxia due to CHF exacerbation and hypertensive emergency.  Since she was taken off diuretics when she was last here for syncope and dehydration on 8/28.    In ED, BP 192/117.  HR 118.  RR 26.  Was on BiPAP and weaned to 4 L.  No documented desaturation.  Hgb 10.8.  Troponin 69.  BNP 1742.  CXR with vascular congestion, pulmonary interstitial edema and patchy opacities in the right base.  Received IV Lasix, vancomycin, cefepime and started on nitroglycerin.  Cardiology consulted and admitted.  Subjective: Seen and examined earlier this morning.  Patient had 2 episodes of hematochezia overnight.  Feels better from breathing standpoint.  She feels pain in her chest and back.  She has chronic neck, upper and lower back pain.  Chest pain is reproducible with palpation.  Pain is unchanged over the last 2 to 3 days.  She also reports epigastric pain.  Objective: Vitals:   08/06/22 0130 08/06/22 0330 08/06/22 0736 08/06/22 1154  BP: (!) 145/100 (!) 149/122  (!) 116/98  Pulse: 98 98    Resp: '18 17  18  '$ Temp:  98.4 F (36.9 C)  98 F (36.7 C)  TempSrc:  Oral  Oral  SpO2: 96% 96% 100% 98%  Weight:  90.7 kg    Height:        Examination:  GENERAL: No apparent distress.  Nontoxic. HEENT: MMM.  Vision and hearing grossly intact.  NECK:  Supple.  No apparent JVD.  RESP:  No IWOB.  Fair aeration bilaterally.  Bibasilar crackles. CVS: Irregular rhythm.  Normal rate.  Heart sounds normal.  ABD/GI/GU: BS+. Abd soft, NTND.  MSK/EXT:  Moves extremities. No apparent deformity. No edema.  SKIN: no apparent skin lesion or wound NEURO: Awake, alert and oriented appropriately.  No apparent focal neuro deficit. PSYCH: Calm. Normal affect.   Procedures:  None  Microbiology summarized: MRSA PCR screen nonreactive. COVID-19 PCR nonreactive.  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  Required BiPAP on presentation. Likely due to CHF exacerbation.  Edema less likely.  Procalcitonin negative.  Doubt COPD exacerbation as well.  INR subtherapeutic but CTA chest negative for PE.  She has chest pain but reproducible suggesting musculoskeletal etiology. -Continue IV diuretics -Wean off oxygen as able  Acute on chronic systolic CHF: echo and TEE just done. EF of 50-55%. MVR/TVR with no complications. (1/0/27 echo EF of 45-50%).    She presents with acute dyspnea and orthopnea.  Had a JVD on presentation.  Crackles on exam.  She has vascular congestion on CXR and elevated BNP.  -Cardiology following. -Chronic new IV diuretics -Strict intake and output, daily weight, renal functions and electrolytes -GDMT losartan, Aldactone and Lopressor   Chronic COPD:  Pneumonia  ruled out.  Received Solu-Medrol, vancomycin and cefepime in ED.  Procalcitonin negative.  Repeat CXR with improvement suggesting CHF. -Continue nebulizers -May need outpatient follow-up with pulmonology/PFT   Hypertensive emergency: Markedly elevated BP with endorgan damage including pulmonary edema/CHF and elevated troponin -CHF meds as above. -Cardiology added amlodipine.    Elevated troponin: Suspect demand ischemia in the setting of CHF, hypertensive crisis and tachycardia versus non-STEMI.  Has chest pain per reproducible -Defer to cardiology.  Paroxysmal atrial fibrillation with mild RVR.  CHADS2 score at least 4.  Seems to be on warfarin at home but INR subtherapeutic.  It seems GI recommended holding warfarin for 3 days after colonoscopy on 8/25.  Not sure if she resumed warfarin for this hospitalization.  She has some hematochezia.  Colonoscopy on 8/25 with colon polyps and internal hemorrhoids.  Hemoglobin at baseline. -Continue Lopressor -Holding warfarin.  Start heparin drip without bolus -Closely monitor H&H   S/P MVR/TVR on 04/19/22.  Due to RHD.  Recent TEE unremarkable.  Controlled NIDDM-2 with hyperglycemia and HLD: Recent A1c 6.0%. Recent Labs  Lab 08/04/22 1621 08/05/22 1638 08/05/22 2111 08/06/22 0613 08/06/22 1129  GLUCAP 85 102* 110* 130* 113*  -Continue current insulin regimen -Continue statin.   Normocytic anemia/lower GI bleed/internal hemorrhoids/colon polyps: H&H relatively stable and at baseline.  Recent colonoscopy on 8/25 with internal hemorrhoid, colonic AVM (treated with APC) and colon polyps Recent Labs    07/29/22 0451 07/30/22 0552 07/31/22 2536 08/03/22 0047 08/03/22 0339 08/03/22 0713 08/04/22 1116 08/05/22 1130 08/05/22 2339 08/06/22 0517  HGB 10.0* 11.1* 11.9* 9.2* 10.0* 9.1* 9.3* 10.8* 11.5* 10.2*  -Closely monitor H&H -Check anemia panel in the morning.   Chronic neck, upper and lower back pain: unchanged from baseline.  On  oxycodone 15 mg every 6 hours as needed at home.  Narcotic database reviewed. -Increase oxycodone to 15 mg every 8 hours -Continue home gabapentin   Anxiety and depression -Continue wellbutrin and lexapro    Tobacco use -Nicotine patch -Encouraged cessation    QT prolongation: QTc 512 but exaggerated by tachycardia. -Optimize electrolytes-repelete magnesium -Continue telemetry monitoring -Minimize QT prolonging drugs -Repeat EKG  Body mass index is 27.88 kg/m.           DVT prophylaxis:   warfarin (COUMADIN) tablet 2.5 mg  Code Status: Full code Family Communication: Updated patient's sister over the phone Level of care: Progressive Status is: Inpatient Remains inpatient appropriate because: Acute respiratory failure and acute systolic CHF   Final disposition: Likely home once medically stable Consultants:  Cardiology  Sch Meds:  Scheduled Meds:  amLODipine  5 mg Oral Daily   ARIPiprazole  5 mg Oral Daily   atorvastatin  20 mg Oral Daily   buPROPion  300 mg Oral q AM   escitalopram  10 mg Oral q morning   estrogens (conjugated)  0.45 mg Oral Daily   furosemide  40 mg Intravenous Daily   gabapentin  300 mg Oral TID   insulin aspart  0-9 Units Subcutaneous TID  WC   ipratropium-albuterol  3 mL Nebulization BID   losartan  100 mg Oral Daily   metoprolol tartrate  25 mg Oral BID   nicotine  14 mg Transdermal Daily   pantoprazole  40 mg Oral Daily   sodium chloride flush  3 mL Intravenous Q12H   spironolactone  12.5 mg Oral Daily   warfarin  2.5 mg Oral ONCE-1600   Warfarin - Pharmacist Dosing Inpatient   Does not apply q1600   Continuous Infusions:  sodium chloride     vancomycin     PRN Meds:.sodium chloride, acetaminophen **OR** acetaminophen, levalbuterol, metoprolol tartrate, oxyCODONE, sodium chloride flush, traZODone  Antimicrobials: Anti-infectives (From admission, onward)    Start     Dose/Rate Route Frequency Ordered Stop   08/06/22 1400   vancomycin (VANCOREADY) IVPB 2000 mg/400 mL        2,000 mg 200 mL/hr over 120 Minutes Intravenous Every 24 hours 08/05/22 1331     08/05/22 1345  vancomycin (VANCOREADY) IVPB 2000 mg/400 mL        2,000 mg 200 mL/hr over 120 Minutes Intravenous  Once 08/05/22 1325 08/05/22 1655   08/05/22 1300  ceFEPIme (MAXIPIME) 2 g in sodium chloride 0.9 % 100 mL IVPB        2 g 200 mL/hr over 30 Minutes Intravenous  Once 08/05/22 1256 08/05/22 1527        I have personally reviewed the following labs and images: CBC: Recent Labs  Lab 08/03/22 0047 08/03/22 0339 08/03/22 0713 08/04/22 1116 08/05/22 1130 08/05/22 2339 08/06/22 0517  WBC 6.0  --  6.3 5.6 7.0 6.2  --   NEUTROABS 3.0  --   --   --   --   --   --   HGB 9.2*   < > 9.1* 9.3* 10.8* 11.5* 10.2*  HCT 29.6*   < > 30.9* 29.0* 32.9* 33.9* 31.1*  MCV 92.8  --  97.5 90.6 87.0 84.5  --   PLT 223  --  217 203 270 274  --    < > = values in this interval not displayed.   BMP &GFR Recent Labs  Lab 07/31/22 4967 08/03/22 0047 08/04/22 1116 08/05/22 1130 08/05/22 1430 08/05/22 2339  NA 135 133* 135 141  --  138  K 4.4 4.1 4.4 4.3  --  4.2  CL 106 102 104 109  --  105  CO2 19* 19* 23 23  --  19*  GLUCOSE 106* 84 113* 102*  --  127*  BUN 12 26* 19 11  --  15  CREATININE 1.19* 3.14* 1.79* 1.08*  --  1.13*  CALCIUM 8.6* 7.3* 7.6* 8.5*  --  8.7*  MG  --   --   --   --  1.4* 1.7   Estimated Creatinine Clearance: 64.2 mL/min (A) (by C-G formula based on SCr of 1.13 mg/dL (H)). Liver & Pancreas: Recent Labs  Lab 07/31/22 0633 08/03/22 0047 08/05/22 1130  AST 43* 21 26  ALT '18 12 13  '$ ALKPHOS 75 57 75  BILITOT 1.0 0.6 1.1  PROT 7.0 5.9* 6.5  ALBUMIN 3.6 3.3* 3.4*   No results for input(s): "LIPASE", "AMYLASE" in the last 168 hours. No results for input(s): "AMMONIA" in the last 168 hours. Diabetic: No results for input(s): "HGBA1C" in the last 72 hours. Recent Labs  Lab 08/04/22 1621 08/05/22 1638 08/05/22 2111  08/06/22 0613 08/06/22 1129  GLUCAP 85 102* 110* 130* 113*   Cardiac  Enzymes: No results for input(s): "CKTOTAL", "CKMB", "CKMBINDEX", "TROPONINI" in the last 168 hours. No results for input(s): "PROBNP" in the last 8760 hours. Coagulation Profile: Recent Labs  Lab 08/03/22 0047 08/04/22 0658 08/05/22 1130 08/05/22 1812 08/06/22 0940  INR 1.3* 1.4* 1.4* 1.4* 1.7*   Thyroid Function Tests: No results for input(s): "TSH", "T4TOTAL", "FREET4", "T3FREE", "THYROIDAB" in the last 72 hours. Lipid Profile: No results for input(s): "CHOL", "HDL", "LDLCALC", "TRIG", "CHOLHDL", "LDLDIRECT" in the last 72 hours. Anemia Panel: No results for input(s): "VITAMINB12", "FOLATE", "FERRITIN", "TIBC", "IRON", "RETICCTPCT" in the last 72 hours. Urine analysis:    Component Value Date/Time   COLORURINE YELLOW 07/23/2022 0109   APPEARANCEUR HAZY (A) 07/23/2022 0109   LABSPEC 1.010 07/23/2022 0109   PHURINE 5.0 07/23/2022 0109   GLUCOSEU NEGATIVE 07/23/2022 0109   GLUCOSEU NEG mg/dL 01/09/2009 2129   HGBUR NEGATIVE 07/23/2022 0109   HGBUR negative 02/27/2007 1327   BILIRUBINUR NEGATIVE 07/23/2022 0109   KETONESUR NEGATIVE 07/23/2022 0109   PROTEINUR NEGATIVE 07/23/2022 0109   UROBILINOGEN 1.0 06/10/2010 1729   NITRITE NEGATIVE 07/23/2022 0109   LEUKOCYTESUR NEGATIVE 07/23/2022 0109   Sepsis Labs: Invalid input(s): "PROCALCITONIN", "LACTICIDVEN"  Microbiology: Recent Results (from the past 240 hour(s))  SARS Coronavirus 2 by RT PCR (hospital order, performed in [facility] hospital lab) *cepheid single result test* Anterior Nasal Swab     Status: None   Collection Time: 08/05/22  1:08 PM   Specimen: Anterior Nasal Swab  Result Value Ref Range Status   SARS Coronavirus 2 by RT PCR NEGATIVE NEGATIVE Final    Comment: (NOTE) SARS-CoV-2 target nucleic acids are NOT DETECTED.  The SARS-CoV-2 RNA is generally detectable in upper and lower respiratory specimens during the acute phase of  infection. The lowest concentration of SARS-CoV-2 viral copies this assay can detect is 250 copies / mL. A negative result does not preclude SARS-CoV-2 infection and should not be used as the sole basis for treatment or other patient management decisions.  A negative result may occur with improper specimen collection / handling, submission of specimen other than nasopharyngeal swab, presence of viral mutation(s) within the areas targeted by this assay, and inadequate number of viral copies (<250 copies / mL). A negative result must be combined with clinical observations, patient history, and epidemiological information.  Fact Sheet for Patients:   https://www.patel.info/  Fact Sheet for Healthcare Providers: https://hall.com/  This test is not yet approved or  cleared by the [place] FDA and has been authorized for detection and/or diagnosis of SARS-CoV-2 by FDA under an Emergency Use Authorization (EUA).  This EUA will remain in effect (meaning this test can be used) for the duration of the COVID-19 declaration under Section 564(b)(1) of the Act, 21 U.S.C. section 360bbb-3(b)(1), unless the authorization is terminated or revoked sooner.  Performed at [facility], [place] [address] [place], [place] [address]   MRSA Next Gen by PCR, Nasal     Status: None   Collection Time: 08/05/22  1:30 PM   Specimen: Nasal Mucosa; Nasal Swab  Result Value Ref Range Status   MRSA by PCR Next Gen NOT DETECTED NOT DETECTED Final    Comment: (NOTE) The GeneXpert MRSA Assay (FDA approved for NASAL specimens only), is one component of a comprehensive MRSA colonization surveillance program. It is not intended to diagnose MRSA infection nor to guide or monitor treatment for MRSA infections. Test performance is not FDA approved in patients less than 4 years old. Performed at  [facility], [place] [address], [place], [place] [address]      Radiology Studies: DG Chest 2 View  Result Date: 08/06/2022 CLINICAL DATA:  Short of breath. EXAM: CHEST - 2 VIEW COMPARISON:  08/05/2022 FINDINGS: Previous median sternotomy and cardiac valve prosthesis. Heart size appears normal. Small bilateral pleural effusions. Atelectasis noted in the right lung base. No interstitial edema or airspace opacities. No signs of pneumothorax. IMPRESSION: 1. Small bilateral pleural effusions with right base atelectasis. Electronically Signed   By: Kerby Moors M.D.   On: 08/06/2022 06:38      Keymari Sato T. [place]  If 7PM-7AM, please contact night-coverage [email or web address] 08/06/2022, 12:32 PM

## 2022-08-06 NOTE — Progress Notes (Signed)
Mobility Specialist Progress Note:   08/06/22 1045  Mobility  Activity Ambulated with assistance in hallway  Level of Assistance Standby assist, set-up cues, supervision of patient - no hands on  Assistive Device Front wheel walker  Distance Ambulated (ft) 230 ft  Activity Response Tolerated well  $Mobility charge 1 Mobility   Pt received in bed willing to participate in mobility. No complaints of pain. Required 1 seated break d/t SOB ( SpO2 97%). Left in bed with call bell in reach and all needs met.   [facility] Lebert Lovern Mobility Specialist

## 2022-08-07 DIAGNOSIS — J9601 Acute respiratory failure with hypoxia: Secondary | ICD-10-CM | POA: Diagnosis not present

## 2022-08-07 DIAGNOSIS — K921 Melena: Secondary | ICD-10-CM

## 2022-08-07 DIAGNOSIS — I5023 Acute on chronic systolic (congestive) heart failure: Secondary | ICD-10-CM | POA: Diagnosis not present

## 2022-08-07 DIAGNOSIS — D62 Acute posthemorrhagic anemia: Secondary | ICD-10-CM

## 2022-08-07 DIAGNOSIS — I959 Hypotension, unspecified: Secondary | ICD-10-CM

## 2022-08-07 DIAGNOSIS — R778 Other specified abnormalities of plasma proteins: Secondary | ICD-10-CM | POA: Diagnosis not present

## 2022-08-07 DIAGNOSIS — I161 Hypertensive emergency: Secondary | ICD-10-CM | POA: Diagnosis not present

## 2022-08-07 LAB — IRON AND TIBC
Iron: 28 ug/dL (ref 28–170)
Saturation Ratios: 11 % (ref 10.4–31.8)
TIBC: 266 ug/dL (ref 250–450)
UIBC: 238 ug/dL

## 2022-08-07 LAB — CK: Total CK: 29 U/L — ABNORMAL LOW (ref 38–234)

## 2022-08-07 LAB — BASIC METABOLIC PANEL
Anion gap: 7 (ref 5–15)
BUN: 22 mg/dL (ref 8–23)
CO2: 24 mmol/L (ref 22–32)
Calcium: 8 mg/dL — ABNORMAL LOW (ref 8.9–10.3)
Chloride: 106 mmol/L (ref 98–111)
Creatinine, Ser: 1.38 mg/dL — ABNORMAL HIGH (ref 0.44–1.00)
GFR, Estimated: 43 mL/min — ABNORMAL LOW (ref >60)
Glucose, Bld: 86 mg/dL (ref 70–99)
Potassium: 3.9 mmol/L (ref 3.5–5.1)
Sodium: 137 mmol/L (ref 135–145)

## 2022-08-07 LAB — HEMOGLOBIN AND HEMATOCRIT, BLOOD
HCT: 27.2 % — ABNORMAL LOW (ref 36.0–46.0)
HCT: 23.7 % — ABNORMAL LOW (ref 36.0–46.0)
Hemoglobin: 8.8 g/dL — ABNORMAL LOW (ref 12.0–15.0)
Hemoglobin: 7.9 g/dL — ABNORMAL LOW (ref 12.0–15.0)

## 2022-08-07 LAB — CBC
HCT: 24 % — ABNORMAL LOW (ref 36.0–46.0)
Hemoglobin: 8 g/dL — ABNORMAL LOW (ref 12.0–15.0)
MCH: 29.1 pg (ref 26.0–34.0)
MCHC: 33.3 g/dL (ref 30.0–36.0)
MCV: 87.3 fL (ref 80.0–100.0)
Platelets: 198 10*3/uL (ref 150–400)
RBC: 2.75 MIL/uL — ABNORMAL LOW (ref 3.87–5.11)
RDW: 17 % — ABNORMAL HIGH (ref 11.5–15.5)
WBC: 5.7 10*3/uL (ref 4.0–10.5)
nRBC: 0 % (ref 0.0–0.2)

## 2022-08-07 LAB — GLUCOSE, CAPILLARY
Glucose-Capillary: 94 mg/dL (ref 70–99)
Glucose-Capillary: 85 mg/dL (ref 70–99)
Glucose-Capillary: 88 mg/dL (ref 70–99)
Glucose-Capillary: 92 mg/dL (ref 70–99)

## 2022-08-07 LAB — RETICULOCYTES
Immature Retic Fract: 32.4 % — ABNORMAL HIGH (ref 2.3–15.9)
RBC.: 2.77 MIL/uL — ABNORMAL LOW (ref 3.87–5.11)
Retic Count, Absolute: 97.5 10*3/uL (ref 19.0–186.0)
Retic Ct Pct: 3.5 % — ABNORMAL HIGH (ref 0.4–3.1)

## 2022-08-07 LAB — PROTIME-INR
INR: 1.7 — ABNORMAL HIGH (ref 0.8–1.2)
Prothrombin Time: 19.4 seconds — ABNORMAL HIGH (ref 11.4–15.2)

## 2022-08-07 LAB — FERRITIN: Ferritin: 54 ng/mL (ref 11–307)

## 2022-08-07 LAB — VITAMIN B12: Vitamin B-12: 879 pg/mL (ref 180–914)

## 2022-08-07 LAB — MAGNESIUM: Magnesium: 1.5 mg/dL — ABNORMAL LOW (ref 1.7–2.4)

## 2022-08-07 LAB — BRAIN NATRIURETIC PEPTIDE: B Natriuretic Peptide: 526.5 pg/mL — ABNORMAL HIGH (ref 0.0–100.0)

## 2022-08-07 LAB — FOLATE: Folate: 11 ng/mL (ref >5.9)

## 2022-08-07 NOTE — Progress Notes (Addendum)
PROGRESS NOTE  [name] VQQ:[id number] DOB: August 19, 1961   PCP: [facility]  Patient is from: Home.  Lives alone.  Uses a walker to ambulate.  Has a wheelchair as well.  DOA: 08/05/2022 LOS: 2  Chief complaints Chief Complaint  Patient presents with   Shortness of Breath     Brief Narrative / Interim history: 61 year old F with PMH of systolic CHF, paroxysmal A-fib on warfarin, DM-2, mitral valve and tricuspid valve repair, HTN, recent hospitalization on 8/21 for GI bleed on 8/28 for syncope/dehydration returning with an acute dyspnea, orthopnea, cough, chest heaviness and elevated BP, and admitted for acute respiratory failure with hypoxia due to CHF exacerbation and hypertensive emergency.  Since she was taken off diuretics when she was last here for syncope and dehydration on 8/28.    In ED, BP 192/117.  HR 118.  RR 26.  Was on BiPAP and weaned to 4 L.  No documented desaturation.  Hgb 10.8.  Troponin 69.  BNP 1742.  CXR with vascular congestion, pulmonary interstitial edema and patchy opacities in the right base.  Received IV Lasix, vancomycin, cefepime and started on nitroglycerin.  Cardiology consulted and admitted.  Patient developed hematochezia.  Hgb trended from 10.8-8.  Patient had colonoscopy on 8/25.  Anticoagulation discontinued.  GI consulted.  Subjective: Seen and examined earlier this morning.  Continues to endorse hematochezia that she describes as blood smear on stool.  She denies frank bleeding or blood clots.  Does not seem to be symptomatic from this.  Objective: Vitals:   08/07/22 0553 08/07/22 0741 08/07/22 0833 08/07/22 0846  BP: (!) 94/55   (!) 86/55  Pulse: 94   97  Resp: 17     Temp: 98.4 F (36.9 C)  97.9 F (36.6 C)   TempSrc: Oral  Oral   SpO2: 99% 97% 95%   Weight: 90.8 kg     Height:        Examination:  GENERAL: No apparent distress.  Nontoxic. HEENT: MMM.  Vision and hearing grossly intact.  NECK: Supple.  No apparent  JVD.  RESP:  No IWOB.  Fair aeration bilaterally.  Bibasilar crackles. CVS: Irregular rhythm.  Normal rate.  Heart sounds normal.  ABD/GI/GU: BS+. Abd soft, NTND.  MSK/EXT:  Moves extremities. No apparent deformity. No edema.  SKIN: no apparent skin lesion or wound NEURO: Awake and alert. Oriented appropriately.  No apparent focal neuro deficit. PSYCH: Calm. Normal affect.   Procedures:  None  Microbiology summarized: MRSA PCR screen nonreactive. COVID-19 PCR nonreactive.  Assessment and plan: Principal Problem:   Acute respiratory failure with hypoxia (HCC) Active Problems:   Acute on chronic systolic CHF (congestive heart failure) (HCC)   possible COPD with acute exacerbation (HCC)   Elevated troponin   Hypertensive emergency   Paroxysmal atrial fibrillation ([place])   S/P MVR/TVR  04/19/22   Type 2 diabetes mellitus without complication, without long-term current use of insulin (HCC)   Normocytic anemia   Hypercholesterolemia   Chronic pain syndrome   Anxiety and depression   Tobacco use   QT prolongation  Acute respiratory failure with hypoxia-desaturated to 89% on room air.  Required BiPAP on presentation. Likely due to CHF exacerbation.  Edema less likely.  Procalcitonin negative.  Doubt COPD exacerbation as well.  INR subtherapeutic but CTA chest negative for PE.  She has chest pain but reproducible suggesting musculoskeletal etiology. -Diuretics per cardiology. -Wean off oxygen as able   Acute on chronic systolic CHF: echo and  TEE just done. EF of 50-55%. MVR/TVR with no complications. (01/09/25 echo EF of 45-50%).    She presents with acute dyspnea and orthopnea.  Had a JVD on presentation.  Crackles on exam.  She has vascular congestion on CXR and elevated BNP.  UOP 800 cc in the last 24 hours.  Slightly hypotensive. -Cardiology following. -Cardiology holding diuretics. -Discontinue losartan due to hypotension. -Added parameters for metoprolol and Aldactone. -Strict  intake and output, daily weight, renal functions and electrolytes  Paroxysmal atrial fibrillation with mild RVR.  CHADS2 score at least 4.  It seems GI recommended holding warfarin for 3 days after colonoscopy on 8/25.  Not sure if she resumed warfarin before this hospitalization.  INR subtherapeutic.  No hematochezia.  -Hold anticoagulation -Continue metoprolol with parameters.  Acute blood loss anemia/lower GI bleed/internal hemorrhoids/colon polyps:  Recent colonoscopy on 8/25 with internal hemorrhoid, colonic AVM (treated with APC) and colon polyps.  H&H slowly dropping. Recent Labs    08/03/22 0047 08/03/22 0339 08/03/22 0713 08/04/22 1116 08/05/22 1130 08/05/22 2339 08/06/22 0517 08/06/22 0940 08/06/22 2118 08/07/22 0705  HGB 9.2* 10.0* 9.1* 9.3* 10.8* 11.5* 10.2* 10.1* 8.5* 8.0*  -Discontinue anticoagulation -GI consult    Chronic COPD:  Pneumonia ruled out.  Received Solu-Medrol, vancomycin and cefepime in ED.  Procalcitonin negative.  Repeat CXR with improvement suggesting CHF. -Continue nebulizers -May need outpatient follow-up with pulmonology/PFT   Hypertensive emergency: Now hypotensive. -Stop losartan and added holding parameters to other meds.   Elevated troponin: Suspect demand ischemia in the setting of CHF, hypertensive crisis and tachycardia versus non-STEMI.  Has chest pain per reproducible -Defer to cardiology.  S/P MVR/TVR on 04/19/22.  Due to RHD.  Recent TEE unremarkable.  Controlled NIDDM-2 with hyperglycemia and HLD: Recent A1c 6.0%.  Euglycemic. -Discontinue CBG monitoring and SSI  Chronic neck, upper and lower back pain: unchanged from baseline.  On oxycodone 15 mg every 6 hours as needed at home.  Narcotic database reviewed. -Continue oxycodone to 15 mg every 8 hours -Continue home gabapentin   Anxiety and depression -Continue wellbutrin and lexapro    Tobacco use -Nicotine patch -Encouraged cessation    QT prolongation: QTc 512 but  exaggerated by tachycardia. -Optimize electrolytes-repelete magnesium -Continue telemetry monitoring -Minimize QT prolonging drugs -Repeat EKG  Body mass index is 27.92 kg/m.           DVT prophylaxis:    SCD  Code Status: Full code Family Communication: Updated patient's sister over the phone Level of care: Progressive Status is: Inpatient Remains inpatient appropriate because: Acute respiratory failure and acute systolic CHF, hypertension and acute blood loss anemia   Final disposition: Likely home once medically stable Consultants:  Cardiology Gastroenterology  Sch Meds:  Scheduled Meds:  ARIPiprazole  5 mg Oral Daily   atorvastatin  20 mg Oral Daily   buPROPion  300 mg Oral q AM   escitalopram  10 mg Oral q morning   estrogens (conjugated)  0.45 mg Oral Daily   furosemide  40 mg Intravenous Daily   gabapentin  300 mg Oral TID   hydrocortisone   Rectal BID   insulin aspart  0-9 Units Subcutaneous TID WC   ipratropium-albuterol  3 mL Nebulization BID   metoprolol tartrate  25 mg Oral BID   nicotine  14 mg Transdermal Daily   pantoprazole  40 mg Oral Daily   sodium chloride flush  3 mL Intravenous Q12H   spironolactone  12.5 mg Oral Daily   Continuous  Infusions:  sodium chloride     PRN Meds:.sodium chloride, acetaminophen **OR** acetaminophen, levalbuterol, metoprolol tartrate, oxyCODONE, sodium chloride flush, traZODone  Antimicrobials: Anti-infectives (From admission, onward)    Start     Dose/Rate Route Frequency Ordered Stop   08/06/22 1400  vancomycin (VANCOREADY) IVPB 2000 mg/400 mL  Status:  Discontinued        2,000 mg 200 mL/hr over 120 Minutes Intravenous Every 24 hours 08/05/22 1331 08/06/22 1606   08/05/22 1345  vancomycin (VANCOREADY) IVPB 2000 mg/400 mL        2,000 mg 200 mL/hr over 120 Minutes Intravenous  Once 08/05/22 1325 08/05/22 1655   08/05/22 1300  ceFEPIme (MAXIPIME) 2 g in sodium chloride 0.9 % 100 mL IVPB        2 g 200 mL/hr  over 30 Minutes Intravenous  Once 08/05/22 1256 08/05/22 1527        I have personally reviewed the following labs and images: CBC: Recent Labs  Lab 08/03/22 0047 08/03/22 0339 08/03/22 0713 08/04/22 1116 08/05/22 1130 08/05/22 2339 08/06/22 0517 08/06/22 0940 08/06/22 2118 08/07/22 0705  WBC 6.0  --  6.3 5.6 7.0 6.2  --   --   --  5.7  NEUTROABS 3.0  --   --   --   --   --   --   --   --   --   HGB 9.2*   < > 9.1* 9.3* 10.8* 11.5* 10.2* 10.1* 8.5* 8.0*  HCT 29.6*   < > 30.9* 29.0* 32.9* 33.9* 31.1* 30.3* 24.8* 24.0*  MCV 92.8  --  97.5 90.6 87.0 84.5  --   --   --  87.3  PLT 223  --  217 203 270 274  --   --   --  198   < > = values in this interval not displayed.   BMP &GFR Recent Labs  Lab 08/04/22 1116 08/05/22 1130 08/05/22 1430 08/05/22 2339 08/06/22 0940 08/07/22 0708  NA 135 141  --  138 138 137  K 4.4 4.3  --  4.2 4.3 3.9  CL 104 109  --  105 107 106  CO2 23 23  --  19* 23 24  GLUCOSE 113* 102*  --  127* 118* 86  BUN 19 11  --  '15 16 22  '$ CREATININE 1.79* 1.08*  --  1.13* 1.15* 1.38*  CALCIUM 7.6* 8.5*  --  8.7* 8.5* 8.0*  MG  --   --  1.4* 1.7  --  1.5*   Estimated Creatinine Clearance: 52.6 mL/min (A) (by C-G formula based on SCr of 1.38 mg/dL (H)). Liver & Pancreas: Recent Labs  Lab 08/03/22 0047 08/05/22 1130  AST 21 26  ALT 12 13  ALKPHOS 57 75  BILITOT 0.6 1.1  PROT 5.9* 6.5  ALBUMIN 3.3* 3.4*   No results for input(s): "LIPASE", "AMYLASE" in the last 168 hours. No results for input(s): "AMMONIA" in the last 168 hours. Diabetic: No results for input(s): "HGBA1C" in the last 72 hours. Recent Labs  Lab 08/06/22 0613 08/06/22 1129 08/06/22 2102 08/07/22 0551 08/07/22 1155  GLUCAP 130* 113* 104* 94 85   Cardiac Enzymes: Recent Labs  Lab 08/07/22 0708  CKTOTAL 29*   No results for input(s): "PROBNP" in the last 8760 hours. Coagulation Profile: Recent Labs  Lab 08/04/22 0658 08/05/22 1130 08/05/22 1812 08/06/22 0940  08/07/22 0708  INR 1.4* 1.4* 1.4* 1.7* 1.7*   Thyroid Function Tests: No results for  input(s): "TSH", "T4TOTAL", "FREET4", "T3FREE", "THYROIDAB" in the last 72 hours. Lipid Profile: No results for input(s): "CHOL", "HDL", "LDLCALC", "TRIG", "CHOLHDL", "LDLDIRECT" in the last 72 hours. Anemia Panel: Recent Labs    08/07/22 0705 08/07/22 0708  VITAMINB12  --  879  FOLATE 11.0  --   FERRITIN  --  54  TIBC  --  266  IRON  --  28  RETICCTPCT 3.5*  --    Urine analysis:    Component Value Date/Time   COLORURINE YELLOW 07/23/2022 0109   APPEARANCEUR HAZY (A) 07/23/2022 0109   LABSPEC 1.010 07/23/2022 0109   PHURINE 5.0 07/23/2022 0109   GLUCOSEU NEGATIVE 07/23/2022 0109   GLUCOSEU NEG mg/dL 01/09/2009 2129   HGBUR NEGATIVE 07/23/2022 0109   HGBUR negative 02/27/2007 1327   BILIRUBINUR NEGATIVE 07/23/2022 0109   KETONESUR NEGATIVE 07/23/2022 0109   PROTEINUR NEGATIVE 07/23/2022 0109   UROBILINOGEN 1.0 06/10/2010 1729   NITRITE NEGATIVE 07/23/2022 0109   LEUKOCYTESUR NEGATIVE 07/23/2022 0109   Sepsis Labs: Invalid input(s): "PROCALCITONIN", "LACTICIDVEN"  Microbiology: Recent Results (from the past 240 hour(s))  SARS Coronavirus 2 by RT PCR (hospital order, performed in [facility] hospital lab) *cepheid single result test* Anterior Nasal Swab     Status: None   Collection Time: 08/05/22  1:08 PM   Specimen: Anterior Nasal Swab  Result Value Ref Range Status   SARS Coronavirus 2 by RT PCR NEGATIVE NEGATIVE Final    Comment: (NOTE) SARS-CoV-2 target nucleic acids are NOT DETECTED.  The SARS-CoV-2 RNA is generally detectable in upper and lower respiratory specimens during the acute phase of infection. The lowest concentration of SARS-CoV-2 viral copies this assay can detect is 250 copies / mL. A negative result does not preclude SARS-CoV-2 infection and should not be used as the sole basis for treatment or other patient management decisions.  A negative result may occur  with improper specimen collection / handling, submission of specimen other than nasopharyngeal swab, presence of viral mutation(s) within the areas targeted by this assay, and inadequate number of viral copies (<250 copies / mL). A negative result must be combined with clinical observations, patient history, and epidemiological information.  Fact Sheet for Patients:   https://www.patel.info/  Fact Sheet for Healthcare Providers: https://hall.com/  This test is not yet approved or  cleared by the [place] FDA and has been authorized for detection and/or diagnosis of SARS-CoV-2 by FDA under an Emergency Use Authorization (EUA).  This EUA will remain in effect (meaning this test can be used) for the duration of the COVID-19 declaration under Section 564(b)(1) of the Act, 21 U.S.C. section 360bbb-3(b)(1), unless the authorization is terminated or revoked sooner.  Performed at [facility], [place] [address] [place], [place] [address]   MRSA Next Gen by PCR, Nasal     Status: None   Collection Time: 08/05/22  1:30 PM   Specimen: Nasal Mucosa; Nasal Swab  Result Value Ref Range Status   MRSA by PCR Next Gen NOT DETECTED NOT DETECTED Final    Comment: (NOTE) The GeneXpert MRSA Assay (FDA approved for NASAL specimens only), is one component of a comprehensive MRSA colonization surveillance program. It is not intended to diagnose MRSA infection nor to guide or monitor treatment for MRSA infections. Test performance is not FDA approved in patients less than 35 years old. Performed at [facility], [place] [address] [place], [place] [address]     Radiology Studies: No results found.  Ansar Skoda T. Norman  If 7PM-7AM, please contact night-coverage [email or web address] 08/07/2022, 12:00 PM

## 2022-08-07 NOTE — Consult Note (Signed)
Consultation  Referring Provider:      Primary Care Physician:  [facility] Primary Gastroenterologist:     Pollyann Savoy    Reason for Consultation:     Recurrent GI bleed         HPI:   [name] is a 61 y.o. female with a history of atrial fibrillation on Coumadin, mitral valve repair/tricuspid valve repair may 2023, complicated by pericardial effusion with tamponade requiring pericardiocentesis who was recently admitted to [facility] for the syncopal episode associated with hematochezia and was found to have a hemoglobin of 7.5.  She was hypotense on initial presentation with systolics in the 48G. 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  She had a couple of other small polyps and internal hemorrhoids, but otherwise unremarkable colonoscopy. She was discharged on August 26, and her Coumadin was recommended to restart August 28.  She was admitted from August 28 to August 30 after syncopal event.  She had not noticed any blood in the stool around that time.  She was hypotensive with systolics in the 50I on presentation and was noted to have acute kidney injury which corrected quickly with IV fluids.  Hemoglobin was 9 on admission and remained stable.  She was admitted again August 31, after she woke up feeling very short of breath and hypertensive.  Her blood pressure was 192/117 in the emergency department, with heart rate 118.  Oxygen sats in the 80s initially and she was placed on BiPAP in the ED.  Chest x-ray showed vascular congestion and opacities in the right base.  Her hemoglobin was 10.8, BNP 1742.  Her acute respiratory failure was thought to be secondary to CHF exacerbation as well is possible COPD exacerbation.  She was treated with diuretics, steroids, nebulizers and empiric antibiotics.   Her breathing has improved with these interventions.  The patient tells me that she experienced only 1 bloody bowel movement which was this morning.  It was moderately large in volume with bright red and dark red blood.  Review of the progress notes, however, indicates that she had two episodes of hematochezia the evening of August 31.  She states that she has had some mild upper abdominal discomfort, but no other GI symptoms.  Her hemoglobin this afternoon was 8.8, stable from this morning and last night.  INR 1.7.  She was on a heparin drip, but this has been stopped.  BUN 22 this morning.  BNP improved to 526 today. Repeat chest x-ray yesterday showed small bilateral pleural effusions, no pulmonary edema.  GI history: 07/30/2022  colonoscopy (Dr. Fuller Plan) 2 cm cecal polyp (tubular adenoma with focal high-grade dysplasia), 2 AVMs in the cecum treated with APC, several other small polyps removed, internal hemorrhoids 08/06/2010 colonoscopy for screening with for 3-6 mm polyps in the ascending colon and two 4-5 mm polyps removed from the sigmoid colon as well as internal hemorrhoids; pathology showed tubular adenomas and repeat recommended in 5 years 10/26/2017 small bowel pill capsule endoscopy at [place] (results unavailable) 09/21/2017 office visit for follow-up of iron deficiency anemia: At [facility], noted patient was following up on anemia with FOBT positive stools, she had been seen inpatient 08/24/2017 for the same and had an EGD/colonoscopy 01/03/2017-EGD with normal with negative duodenal biopsies and colonoscopy with fair prep  and 2 tubular adenomas and medium internal hemorrhoids, 08/24/2017 she had an EGD with push enteroscopy with small hiatal hernia, mild gastritis, 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 at about 105 cm of the pediatric colonoscope, APC applied; plan: Continue oral iron supplementation and consider IV iron at some point, continue Omeprazole 20 daily and avoid NSAIDs, recommended  capsule endoscopy to exclude more distal small bowel AVMs, recommended surveillance colonoscopy 12/2021    Past Medical History:  Diagnosis Date   Acute pericardial effusion 05/17/2022   AKI (acute kidney injury) ([place]) 05/17/2022   Anxiety    Arthritis    Cervicalgia    CHF (congestive heart failure) (HCC)    Chondromalacia of patella    Chronic neck pain    Chronic pain syndrome    Depression    secondary to loss of her son at age 55   Diabetic neuropathy ([place])    DMII (diabetes mellitus, type 2) ([place])    Dysrhythmia    Enthesopathy of hip region    Fibromyalgia    GERD (gastroesophageal reflux disease)    Hep C w/o coma, chronic ([place])    Hepatitis C    diagnosed 2005   Hypertension    Insomnia    Low back pain    Lumbago    Mitral regurgitation    Obesity    Pain in joint, upper arm    Primary localized osteoarthrosis, lower leg    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   Substance abuse ([place])    sober since 2001   Tobacco abuse    Tricuspid regurgitation    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    Past Surgical History:  Procedure Laterality Date   ABDOMINAL HYSTERECTOMY     at age 61, unknown reasons   CARPAL TUNNEL RELEASE  12/07/2011   left side   COLONOSCOPY WITH PROPOFOL N/A 07/30/2022   Procedure: COLONOSCOPY WITH PROPOFOL;  Surgeon: Ladene Artist, MD;  Location: [place];  Service: Gastroenterology;  Laterality: N/A;   EYE SURGERY     FOOT SURGERY Bilateral    HOT HEMOSTASIS N/A 07/30/2022   Procedure: HOT HEMOSTASIS (ARGON PLASMA COAGULATION/BICAP);  Surgeon: Ladene Artist, MD;  Location: [place];  Service: Gastroenterology;  Laterality: N/A;   MITRAL VALVE REPAIR N/A 04/19/2022   Procedure: MITRAL VALVE REPAIR WITH 30MM PHYSIO II ANNULOPLASTY RING;  Surgeon: Melrose Nakayama, MD;  Location: [place];  Service: Open Heart Surgery;  Laterality: N/A;   PERICARDIOCENTESIS N/A 05/10/2022   Procedure: PERICARDIOCENTESIS;   Surgeon: Jettie Booze, MD;  Location: [place] [facility];  Service: Cardiovascular;  Laterality: N/A;   POLYPECTOMY  07/30/2022   Procedure: POLYPECTOMY;  Surgeon: Ladene Artist, MD;  Location: [facility] ENDOSCOPY;  Service: Gastroenterology;;   RIGHT/LEFT HEART CATH AND CORONARY ANGIOGRAPHY N/A 02/12/2022   Procedure: RIGHT/LEFT HEART CATH AND CORONARY ANGIOGRAPHY;  Surgeon: Belva Crome, MD;  Location: [place] [facility];  Service: Cardiovascular;  Laterality: N/A;   TEE WITHOUT CARDIOVERSION N/A 02/03/2022   Procedure: TRANSESOPHAGEAL ECHOCARDIOGRAM (TEE);  Surgeon: Josue Hector, MD;  Location: [facility] ENDOSCOPY;  Service: Cardiovascular;  Laterality: N/A;   TEE WITHOUT CARDIOVERSION N/A 04/19/2022   Procedure: TRANSESOPHAGEAL ECHOCARDIOGRAM (TEE);  Surgeon: Melrose Nakayama, MD;  Location: [place];  Service: Open Heart Surgery;  Laterality: N/A;   TEE WITHOUT CARDIOVERSION N/A 07/28/2022   Procedure: TRANSESOPHAGEAL ECHOCARDIOGRAM (TEE);  Surgeon: Sanda Klein, MD;  Location: [place];  Service: Cardiovascular;  Laterality: N/A;  TRICUSPID VALVE REPLACEMENT N/A 04/19/2022   Procedure: TRICUSPID VALVE REPAIR WITH 32MM MC3 ANNULOPLASTY RING;  Surgeon: Melrose Nakayama, MD;  Location: [place];  Service: Open Heart Surgery;  Laterality: N/A;    Family History  Problem Relation Age of Onset   Uterine cancer Mother    Alcohol abuse Father    Alcohol abuse Sister    Drug abuse Sister    Drug abuse Brother    Alcohol abuse Brother    Schizophrenia Son    Diabetes Other    Arthritis Other    Hypertension Other    Heart attack Son 22       Died suddenly     Social History   Tobacco Use   Smoking status: Every Day    Packs/day: 0.25    Years: 35.00    Total pack years: 8.75    Types: Cigarettes   Smokeless tobacco: Never   Tobacco comments:    4 cigarettes a day  Vaping Use   Vaping Use: Some days  Substance Use Topics   Alcohol use: No   Drug use: No    Prior  to Admission medications   Medication Sig Start Date End Date Taking? Authorizing Provider  acetaminophen (TYLENOL) 500 MG tablet Take 1,000 mg by mouth every 6 (six) hours as needed for mild pain.   Yes [provider]  albuterol (PROVENTIL HFA;VENTOLIN HFA) 108 (90 Base) MCG/ACT inhaler Inhale 2 puffs into the lungs every 6 (six) hours as needed for wheezing or shortness of breath.    Yes [provider]  ARIPiprazole (ABILIFY) 5 MG tablet Take 5 mg by mouth daily. 07/20/22  Yes [provider]  atorvastatin (LIPITOR) 20 MG tablet Take 20 mg by mouth daily.   Yes [provider]  buPROPion (WELLBUTRIN XL) 300 MG 24 hr tablet Take 300 mg by mouth in the morning. 03/24/22  Yes [provider]  dexlansoprazole (DEXILANT) 60 MG capsule Take 1 capsule by mouth daily. 07/26/22  Yes [provider]  escitalopram (LEXAPRO) 10 MG tablet Take 10 mg by mouth every morning. 07/08/22  Yes [provider]  furosemide (LASIX) 20 MG tablet Take 1 tablet (20 mg total) by mouth daily. Hold until you see your physician 08/04/22  Yes Lorella Nimrod, MD  gabapentin (NEURONTIN) 600 MG tablet Take 0.5 tablets (300 mg total) by mouth 3 (three) times daily. 05/30/22  Yes [name], [name], MD  linagliptin (TRADJENTA) 5 MG TABS tablet Take 1 tablet (5 mg total) by mouth in the morning. Hold until you see your physician 08/04/22  Yes Lorella Nimrod, MD  losartan (COZAAR) 100 MG tablet Take 1 tablet (100 mg total) by mouth daily. Hold until you see your physician 08/04/22  Yes Lorella Nimrod, MD  metoprolol tartrate (LOPRESSOR) 50 MG tablet Take 0.5 tablets (25 mg total) by mouth 2 (two) times daily. 08/04/22  Yes Lorella Nimrod, MD  naloxone [facility]) nasal spray 4 mg/0.1 mL Place 1 spray into the nose as needed (accidental overdose).   Yes [provider]  oxyCODONE (ROXICODONE) 15 MG immediate release tablet Take 0.5 tablets (7.5 mg total) by mouth every 12 (twelve)  hours as needed for pain. 08/04/22  Yes Lorella Nimrod, MD  PREMARIN 0.45 MG tablet Take 0.45 mg by mouth daily. 01/11/22  Yes [provider]  spironolactone (ALDACTONE) 25 MG tablet Take 12.5 mg by mouth daily. 07/26/22  Yes [provider]  traZODone (DESYREL) 100 MG tablet Take 1  tablet (100 mg total) by mouth at bedtime as needed for sleep. 08/04/22  Yes Lorella Nimrod, MD  warfarin (COUMADIN) 5 MG tablet TAKE 1 TABLET BY MOUTH DAILY OR AS DIRECTED BY ANTICOAGULATION CLINIC. Patient taking differently: Take 5 mg by mouth daily. Take 1 tablet by mouth daily and 1.5 on sundays 07/02/22  Yes Chandrasekhar, Mahesh A, MD    Current Facility-Administered Medications  Medication Dose Route Frequency Provider Last Rate Last Admin   0.9 %  sodium chloride infusion  250 mL Intravenous PRN Orma Flaming, MD       acetaminophen (TYLENOL) tablet 650 mg  650 mg Oral Q6H PRN Orma Flaming, MD   650 mg at 08/06/22 2725   Or   acetaminophen (TYLENOL) suppository 650 mg  650 mg Rectal Q6H PRN Orma Flaming, MD       ARIPiprazole (ABILIFY) tablet 5 mg  5 mg Oral Daily Orma Flaming, MD   5 mg at 08/07/22 0849   atorvastatin (LIPITOR) tablet 20 mg  20 mg Oral Daily Orma Flaming, MD   20 mg at 08/06/22 2237   buPROPion (WELLBUTRIN XL) 24 hr tablet 300 mg  300 mg Oral q AM Orma Flaming, MD   300 mg at 08/07/22 0849   escitalopram (LEXAPRO) tablet 10 mg  10 mg Oral q morning Orma Flaming, MD   10 mg at 08/07/22 0848   estrogens (conjugated) (PREMARIN) tablet 0.45 mg  0.45 mg Oral Daily Orma Flaming, MD   0.45 mg at 08/07/22 0850   furosemide (LASIX) injection 40 mg  40 mg Intravenous Daily Wendee Beavers T, MD   40 mg at 08/06/22 0942   gabapentin (NEURONTIN) tablet 300 mg  300 mg Oral TID Orma Flaming, MD   300 mg at 08/07/22 1415   hydrocortisone (ANUSOL-HC) 2.5 % rectal cream   Rectal BID Wendee Beavers T, MD       insulin aspart (novoLOG) injection 0-9 Units  0-9 Units Subcutaneous TID WC  Orma Flaming, MD   1 Units at 08/06/22 0707   ipratropium-albuterol (DUONEB) 0.5-2.5 (3) MG/3ML nebulizer solution 3 mL  3 mL Nebulization BID Wendee Beavers T, MD   3 mL at 08/07/22 0739   levalbuterol (XOPENEX) nebulizer solution 0.63 mg  0.63 mg Nebulization Q6H PRN Orma Flaming, MD       metoprolol tartrate (LOPRESSOR) injection 5 mg  5 mg Intravenous Q8H PRN Orma Flaming, MD   5 mg at 08/06/22 0342   metoprolol tartrate (LOPRESSOR) tablet 25 mg  25 mg Oral BID Wendee Beavers T, MD   25 mg at 08/06/22 2237   nicotine (NICODERM CQ - dosed in mg/24 hours) patch 14 mg  14 mg Transdermal Daily Orma Flaming, MD   14 mg at 08/07/22 0855   oxyCODONE (Oxy IR/ROXICODONE) immediate release tablet 15 mg  15 mg Oral Q8H PRN Wendee Beavers T, MD   15 mg at 08/07/22 0900   pantoprazole (PROTONIX) EC tablet 40 mg  40 mg Oral Daily Orma Flaming, MD   40 mg at 08/07/22 0847   sodium chloride flush (NS) 0.9 % injection 3 mL  3 mL Intravenous Q12H Orma Flaming, MD   3 mL at 08/07/22 0903   sodium chloride flush (NS) 0.9 % injection 3 mL  3 mL Intravenous PRN Orma Flaming, MD       spironolactone (ALDACTONE) tablet 12.5 mg  12.5 mg Oral Daily Wendee Beavers T, MD   12.5 mg at 08/06/22 0942   traZODone ([place])  tablet 100 mg  100 mg Oral QHS PRN Orma Flaming, MD   100 mg at 08/06/22 2237    Allergies as of 08/05/2022 - Review Complete 08/05/2022  Allergen Reaction Noted   Norco [hydrocodone-acetaminophen] Itching and Other (See Comments) 12/11/2016     Review of Systems:    As per HPI, otherwise negative    Physical Exam:  Vital signs in last 24 hours: Temp:  [97.8 F (36.6 C)-98.7 F (37.1 C)] 97.9 F (36.6 C) (09/02 0833) Pulse Rate:  [89-111] 97 (09/02 0846) Resp:  [15-20] 17 (09/02 0553) BP: (86-99)/(55-75) 86/55 (09/02 0846) SpO2:  [95 %-99 %] 95 % (09/02 0833) FiO2 (%):  [28 %] 28 % (09/02 0741) Weight:  [90.8 kg] 90.8 kg (09/02 0553) Last BM Date : 08/07/22 General:   Pleasant  African-American female in NAD, lying in bed Head:  Normocephalic and atraumatic. Eyes:   No icterus.   Conjunctiva pink. Ears:  Normal auditory acuity. Neck:  Supple Lungs:  Respirations even and unlabored. Lungs clear to auscultation bilaterally.   No wheezes, crackles, or rhonchi.  Heart: Tachycardic, irregular; no MRG Abdomen:  Soft, nondistended, mild tenderness to palpation in the epigastrium and left lower quadrant normal bowel sounds. No appreciable masses or hepatomegaly.  Rectal:  Not performed.  Msk:  Symmetrical without gross deformities.  Extremities:  Without edema. Neurologic:  Alert and  oriented x4;  grossly normal neurologically. Skin:  Intact without significant lesions or rashes. Psych:  Alert and cooperative. Normal affect.  LAB RESULTS: Recent Labs    08/05/22 1130 08/05/22 2339 08/06/22 0517 08/06/22 2118 08/07/22 0705 08/07/22 1250  WBC 7.0 6.2  --   --  5.7  --   HGB 10.8* 11.5*   < > 8.5* 8.0* 8.8*  HCT 32.9* 33.9*   < > 24.8* 24.0* 27.2*  PLT 270 274  --   --  198  --    < > = values in this interval not displayed.   BMET Recent Labs    08/05/22 2339 08/06/22 0940 08/07/22 0708  NA 138 138 137  K 4.2 4.3 3.9  CL 105 107 106  CO2 19* 23 24  GLUCOSE 127* 118* 86  BUN '15 16 22  '$ CREATININE 1.13* 1.15* 1.38*  CALCIUM 8.7* 8.5* 8.0*   LFT Recent Labs    08/05/22 1130  PROT 6.5  ALBUMIN 3.4*  AST 26  ALT 13  ALKPHOS 75  BILITOT 1.1   PT/INR Recent Labs    08/06/22 0940 08/07/22 0708  LABPROT 20.1* 19.4*  INR 1.7* 1.7*    STUDIES: DG Chest 2 View  Result Date: 08/06/2022 CLINICAL DATA:  Short of breath. EXAM: CHEST - 2 VIEW COMPARISON:  08/05/2022 FINDINGS: Previous median sternotomy and cardiac valve prosthesis. Heart size appears normal. Small bilateral pleural effusions. Atelectasis noted in the right lung base. No interstitial edema or airspace opacities. No signs of pneumothorax. IMPRESSION: 1. Small bilateral pleural  effusions with right base atelectasis. Electronically Signed   By: Kerby Moors M.D.   On: 08/06/2022 06:38     PREVIOUS ENDOSCOPIES:            See above   Impression / Plan:   61 year old female with history of atrial fibrillation, status post mitral valve and tricuspid valve repair, COPD and recent history of GI bleed, 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  She was admitted August 31  with acute respiratory failure, likely secondary to CHF +/- COPD exacerbation.  Her dyspnea has improved with diuresis, steroids and antibiotics.  She experienced 3 episodes of hematochezia in the last 36 hours, with a two-point drop in hemoglobin, which has been stable the last 2 hours.  Her last bowel movement was this morning.  She is currently tachycardic with soft blood pressures.  I suspect the patient is bleeding this time from her polypectomy site.  Often, these will resolve spontaneously.  Patient's hemoglobin has been stable for the past 12 hours, and her last BM was earlier this morning.  I am hopeful that her bleeding is starting to subside spontaneously.  She is fairly high risk from a sedation standpoint given her recent CHF exacerbation and COPD, therefore I would only want to repeat a colonoscopy if absolutely necessary.  I recommend continued observation for now.  If her bleeding resolves spontaneously, no plans for repeat colonoscopy.  However, if she continues to bleed briskly with further significant drops in her hemoglobin, then we can plan for a repeat colonoscopy, most likely on Monday.  Hematochezia, suspected post-polypectomy bleed - Observe for now, hopeful bleeding will resolve spontaneously - Continue to hold heparin/Coumadin - Recommend changing diet to clear liquids while concern for bleeding.  Acute respiratory failure/CHF/COPD - Continue management per hospitalist  Thanks   LOS: 2 days   Daryel November  08/07/2022, 2:39 PM

## 2022-08-07 NOTE — Progress Notes (Signed)
Pt had a bowel movement with 3 bright red blood clots - each quarter size. Notified Dr. Cyndia Skeeters

## 2022-08-07 NOTE — Progress Notes (Signed)
Patient had bloody bowel movement , heparin drip discontinued.

## 2022-08-07 NOTE — Progress Notes (Signed)
Rounding Note    Patient Name: [name] Date of Encounter: 08/07/2022  [place] Cardiologist: Werner Lean, MD   Subjective   Report of bloody BM overnight, heparin gtt paused. Hypotensive, but asymptomatic while resting. Legs feel shaky while ambulating.   Inpatient Medications    Scheduled Meds:  amLODipine  5 mg Oral Daily   ARIPiprazole  5 mg Oral Daily   atorvastatin  20 mg Oral Daily   buPROPion  300 mg Oral q AM   escitalopram  10 mg Oral q morning   estrogens (conjugated)  0.45 mg Oral Daily   furosemide  40 mg Intravenous Daily   gabapentin  300 mg Oral TID   hydrocortisone   Rectal BID   insulin aspart  0-9 Units Subcutaneous TID WC   ipratropium-albuterol  3 mL Nebulization BID   losartan  100 mg Oral Daily   metoprolol tartrate  25 mg Oral BID   nicotine  14 mg Transdermal Daily   pantoprazole  40 mg Oral Daily   sodium chloride flush  3 mL Intravenous Q12H   spironolactone  12.5 mg Oral Daily   Continuous Infusions:  sodium chloride     PRN Meds: sodium chloride, acetaminophen **OR** acetaminophen, levalbuterol, metoprolol tartrate, oxyCODONE, sodium chloride flush, traZODone   Vital Signs    Vitals:   08/07/22 0029 08/07/22 0553 08/07/22 0741 08/07/22 0833  BP: 94/75 (!) 94/55    Pulse: 100 94    Resp: 17 17    Temp: 97.8 F (36.6 C) 98.4 F (36.9 C)  97.9 F (36.6 C)  TempSrc: Oral Oral  Oral  SpO2: 97% 99% 97% 95%  Weight:  90.8 kg    Height:        Intake/Output Summary (Last 24 hours) at 08/07/2022 0839 Last data filed at 08/06/2022 2236 Gross per 24 hour  Intake 600 ml  Output 800 ml  Net -200 ml      08/07/2022    5:53 AM 08/06/2022    3:30 AM 08/05/2022    4:31 PM  Last 3 Weights  Weight (lbs) 200 lb 3.2 oz 199 lb 14.4 oz 203 lb 11.3 oz  Weight (kg) 90.81 kg 90.674 kg 92.4 kg      Telemetry    Sinus rhythm with runs of SVT/AT and PVCs- Personally Reviewed  ECG   ST PVCs- Personally  Reviewed  Physical Exam   GEN: No acute distress.   Neck: No JVD Cardiac: iRRR, 1/6 systolic murmur  Respiratory: Clear to auscultation bilaterally. GI: Soft, nontender, non-distended  MS: No edema; No deformity. Neuro:  Nonfocal  Psych: Normal affect   Labs    High Sensitivity Troponin:   Recent Labs  Lab 08/03/22 0339 08/05/22 1130 08/05/22 1430 08/05/22 1812 08/05/22 2038  TROPONINIHS 49* 69* 143* 191* 190*     Chemistry Recent Labs  Lab 08/03/22 0047 08/04/22 1116 08/05/22 1130 08/05/22 1430 08/05/22 2339 08/06/22 0940  NA 133*   < > 141  --  138 138  K 4.1   < > 4.3  --  4.2 4.3  CL 102   < > 109  --  105 107  CO2 19*   < > 23  --  19* 23  GLUCOSE 84   < > 102*  --  127* 118*  BUN 26*   < > 11  --  15 16  CREATININE 3.14*   < > 1.08*  --  1.13* 1.15*  CALCIUM  7.3*   < > 8.5*  --  8.7* 8.5*  MG  --   --   --  1.4* 1.7  --   PROT 5.9*  --  6.5  --   --   --   ALBUMIN 3.3*  --  3.4*  --   --   --   AST 21  --  26  --   --   --   ALT 12  --  13  --   --   --   ALKPHOS 57  --  75  --   --   --   BILITOT 0.6  --  1.1  --   --   --   GFRNONAA 16*   < > 58*  --  55* 54*  ANIONGAP 12   < > 9  --  14 8   < > = values in this interval not displayed.    Lipids No results for input(s): "CHOL", "TRIG", "HDL", "LABVLDL", "LDLCALC", "CHOLHDL" in the last 168 hours.  Hematology Recent Labs  Lab 08/05/22 1130 08/05/22 2339 08/06/22 0517 08/06/22 0940 08/06/22 2118 08/07/22 0705  WBC 7.0 6.2  --   --   --  5.7  RBC 3.78* 4.01  --   --   --  2.75*  2.77*  HGB 10.8* 11.5*   < > 10.1* 8.5* 8.0*  HCT 32.9* 33.9*   < > 30.3* 24.8* 24.0*  MCV 87.0 84.5  --   --   --  87.3  MCH 28.6 28.7  --   --   --  29.1  MCHC 32.8 33.9  --   --   --  33.3  RDW 16.6* 16.5*  --   --   --  17.0*  PLT 270 274  --   --   --  198   < > = values in this interval not displayed.   Thyroid  Recent Labs  Lab 08/03/22 0713  TSH 1.840    BNP Recent Labs  Lab 08/05/22 1130  BNP  1,742.3*    DDimer No results for input(s): "DDIMER" in the last 168 hours.   Radiology    DG Chest 2 View  Result Date: 08/06/2022 CLINICAL DATA:  Short of breath. EXAM: CHEST - 2 VIEW COMPARISON:  08/05/2022 FINDINGS: Previous median sternotomy and cardiac valve prosthesis. Heart size appears normal. Small bilateral pleural effusions. Atelectasis noted in the right lung base. No interstitial edema or airspace opacities. No signs of pneumothorax. IMPRESSION: 1. Small bilateral pleural effusions with right base atelectasis. Electronically Signed   By: Kerby Moors M.D.   On: 08/06/2022 06:38   DG Chest Port 1 View  Result Date: 08/05/2022 CLINICAL DATA:  Dyspnea EXAM: PORTABLE CHEST 1 VIEW COMPARISON:  Chest radiograph 08/03/2022 FINDINGS: Median sternotomy wires and cardiac valve prostheses are unchanged. The cardiac silhouette is unchanged There is vascular congestion with possible mild pulmonary interstitial edema. There are patchy opacities in the right base which could reflect superimposed developing infection. There is no significant pleural effusion. There is no pneumothorax The bones are stable.  The upper mediastinal contours are normal. IMPRESSION: 1. Vascular congestion with possible mild pulmonary interstitial edema. 2. Patchy opacities in the right base could reflect developing infection in the correct clinical setting. Upright PA and lateral radiographs with good inspiratory effort could be considered for better evaluation. Electronically Signed   By: Valetta Mole M.D.   On: 08/05/2022 12:24    Cardiac  Studies   ECHO TEE:  07/28/2022  1. Left ventricular ejection fraction, by estimation, is 60 to 65%. The  left ventricle has normal function. The left ventricle has no regional  wall motion abnormalities. There is mild concentric left ventricular  hypertrophy. Left ventricular diastolic function could not be evaluated.   2. Right ventricular systolic function is normal. The right  ventricular  size is normal. There is moderately elevated pulmonary artery systolic  pressure. The estimated right ventricular systolic pressure is 47.0 mmHg.   3. Left atrial size was severely dilated. No left atrial/left atrial  appendage thrombus was detected. The LAA emptying velocity was 70 cm/s.   4. Right atrial size was mildly dilated.   5. The mitral valve has been repaired/replaced. Mild mitral valve  regurgitation. Mild mitral stenosis. The mean mitral valve gradient is 6.6  mmHg with average heart rate of 83 bpm. There is a prosthetic annuloplasty  ring present in the mitral position. MV Procedure Date: 04/19/22.   6. The tricuspid valve is has been repaired/replaced. The tricuspid valve  is status post repair with an annuloplasty ring. Tricuspid valve  regurgitation is moderate.   7. The aortic valve is tricuspid. Aortic valve regurgitation is not  visualized. No aortic stenosis is present.   Comparison(s): A "double envelope" or "ghosting" artifact is seen during  CW interrogation of the mitral inflow. This may have led to overestimation  of the transmitral gradient on the previous study.    ECHO: 07/27/2022  1. Left ventricular ejection fraction, by estimation, is 50 to 55%. The  left ventricle has low normal function.   2. Left atrial size was severely dilated.   3. Right atrial size was moderately dilated.   4. S/p MV prosthetic annuloplasty ring placed 04/19/2022. mod-severe  mitral stenosis, MV mean gradient 15 mmHg, peak 27 mmhg at HR 87 bpm. Mild  mitral valve regurgitation. There is a present in the mitral position.  Procedure Date: 04/19/22.   5. 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  Tricuspid valve regurgitation is moderate.   6. Aortic valve regurgitation is not visualized.   7. There is severely elevated pulmonary artery systolic pressure.   Conclusion(s)/Recommendation(s): Compared to prior study, MV mean gradient  has  increased. May be related to rate. Consider TEE/cardiac CT if  clinically indicated.    CARDIAC CATH: 02/12/2022 Mild pulmonary hypertension with mean PA pressure 31 mmHg.  Mean pulmonary wedge pressure 23 mmHg with a V wave 41 mmHg.  WHO group 2 pulmonary hypertension. Pulmonary vascular resistance 0.99 [place] units Left ventricular end diastolic pressure 35 mmHg, consistent with diastolic heart failure. Right dominant coronary anatomy.  Widely patent coronaries without obstructive disease. Previously documented severe mitral regurgitation.    Patient Profile     61 y.o. female  with a hx of DM2, HTN, HFpEF, DM neuropathy, GERD, Hep C, s/p MVR/TV repair c/b cardiac tamponade w/p pericardiocentesis 05/10/2022, PAF, tob use, GIB w/ angiodysplasia s/p polyp removal.  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 Will likely worsen tachycardia/hypotension. Per primary/GI. Heparin on hold. - appears euvolemic today, and perhaps dry with hypotension. Hold diuresis today.   Not requiring supp O2 today.   Hold anti HTN therapy, she is hypotensive today.   Paroxysmal atrial fibrillation-she is in sinus rhythm/sinus tachy but has frequent runs of AT/SVT and some PVCs. Metoprolol has been held due to hypotension. Would clarify bleeding today and  if she is normotensive and no concern for active bleeding, resume metoprolol first.      For questions or updates, please contact [place] Please consult [email or web address] for contact info under        Signed, Elouise Munroe, MD  08/07/2022, 8:39 AM

## 2022-08-08 DIAGNOSIS — J9601 Acute respiratory failure with hypoxia: Secondary | ICD-10-CM | POA: Diagnosis not present

## 2022-08-08 DIAGNOSIS — I5023 Acute on chronic systolic (congestive) heart failure: Secondary | ICD-10-CM | POA: Diagnosis not present

## 2022-08-08 DIAGNOSIS — I161 Hypertensive emergency: Secondary | ICD-10-CM | POA: Diagnosis not present

## 2022-08-08 DIAGNOSIS — R778 Other specified abnormalities of plasma proteins: Secondary | ICD-10-CM | POA: Diagnosis not present

## 2022-08-08 LAB — RENAL FUNCTION PANEL
Albumin: 2.9 g/dL — ABNORMAL LOW (ref 3.5–5.0)
Anion gap: 6 (ref 5–15)
BUN: 17 mg/dL (ref 8–23)
CO2: 25 mmol/L (ref 22–32)
Calcium: 8 mg/dL — ABNORMAL LOW (ref 8.9–10.3)
Chloride: 107 mmol/L (ref 98–111)
Creatinine, Ser: 1.08 mg/dL — ABNORMAL HIGH (ref 0.44–1.00)
GFR, Estimated: 58 mL/min — ABNORMAL LOW (ref >60)
Glucose, Bld: 99 mg/dL (ref 70–99)
Phosphorus: 3.3 mg/dL (ref 2.5–4.6)
Potassium: 4.1 mmol/L (ref 3.5–5.1)
Sodium: 138 mmol/L (ref 135–145)

## 2022-08-08 LAB — CBC
HCT: 23.6 % — ABNORMAL LOW (ref 36.0–46.0)
Hemoglobin: 7.7 g/dL — ABNORMAL LOW (ref 12.0–15.0)
MCH: 28.7 pg (ref 26.0–34.0)
MCHC: 32.6 g/dL (ref 30.0–36.0)
MCV: 88.1 fL (ref 80.0–100.0)
Platelets: 197 10*3/uL (ref 150–400)
RBC: 2.68 MIL/uL — ABNORMAL LOW (ref 3.87–5.11)
RDW: 16.8 % — ABNORMAL HIGH (ref 11.5–15.5)
WBC: 5.4 10*3/uL (ref 4.0–10.5)
nRBC: 0 % (ref 0.0–0.2)

## 2022-08-08 LAB — HEMOGLOBIN AND HEMATOCRIT, BLOOD
HCT: 30.2 % — ABNORMAL LOW (ref 36.0–46.0)
Hemoglobin: 10.2 g/dL — ABNORMAL LOW (ref 12.0–15.0)

## 2022-08-08 LAB — PREPARE RBC (CROSSMATCH)

## 2022-08-08 LAB — GLUCOSE, CAPILLARY
Glucose-Capillary: 104 mg/dL — ABNORMAL HIGH (ref 70–99)
Glucose-Capillary: 105 mg/dL — ABNORMAL HIGH (ref 70–99)
Glucose-Capillary: 91 mg/dL (ref 70–99)

## 2022-08-08 LAB — MAGNESIUM: Magnesium: 1.5 mg/dL — ABNORMAL LOW (ref 1.7–2.4)

## 2022-08-08 LAB — PROTIME-INR
INR: 1.6 — ABNORMAL HIGH (ref 0.8–1.2)
Prothrombin Time: 18.8 seconds — ABNORMAL HIGH (ref 11.4–15.2)

## 2022-08-08 MED ORDER — SODIUM CHLORIDE 0.9% IV SOLUTION: Freq: Once | INTRAVENOUS | Status: AC

## 2022-08-08 MED ORDER — MAGNESIUM SULFATE 2 GM/50ML IV SOLN
2.0000 g | Freq: Once | INTRAVENOUS | Status: AC
Start: 2022-08-08 — End: 2022-08-08
  Administered 2022-08-08: 2 g via INTRAVENOUS
  Filled 2022-08-08: qty 50

## 2022-08-08 MED ORDER — FUROSEMIDE 10 MG/ML IJ SOLN
20.0000 mg | Freq: Once | INTRAMUSCULAR | Status: AC
  Administered 2022-08-08: 20 mg via INTRAVENOUS
  Filled 2022-08-08: qty 2

## 2022-08-08 MED ORDER — FUROSEMIDE 40 MG PO TABS: 40.0000 mg | ORAL_TABLET | Freq: Every day | ORAL | Status: DC

## 2022-08-08 MED ORDER — FUROSEMIDE 20 MG PO TABS
20.0000 mg | ORAL_TABLET | Freq: Every day | ORAL | Status: DC
  Administered 2022-08-09: 20 mg via ORAL
  Filled 2022-08-08: qty 1

## 2022-08-08 NOTE — Progress Notes (Addendum)
[facility] Progress Note  CC:  GI bleeding  Subjective:  Feeling pretty good.  Is hungry and wants to eat.  Bleeding it slowing, had blood once this AM that was bright red in the toilet bowel, much less than previous and no clots.  Hgb stable.    Objective:  Vital signs in last 24 hours: Temp:  [97.5 F (36.4 C)-98.6 F (37 C)] 98 F (36.7 C) (09/03 0804) Pulse Rate:  [75-112] 112 (09/03 0804) Resp:  [14-20] 14 (09/03 0804) BP: (96-124)/(64-95) 124/95 (09/03 0804) SpO2:  [90 %-99 %] 95 % (09/03 0804) FiO2 (%):  [32 %] 32 % (09/03 0752) Weight:  [90.2 kg] 90.2 kg (09/03 0426) Last BM Date : 08/07/22 General:  Alert, Well-developed, in NAD Heart:  Regular rate and rhythm; no murmurs Pulm:  CTAB.  No W/R/R. Abdomen:  Soft, non-distended.  BS present.  Minimal epigastric TTP. Extremities:  Without edema. Neurologic:  Alert and oriented x 4;  grossly normal neurologically. Psych:  Alert and cooperative. Normal mood and affect.  Intake/Output from previous day: 09/02 0701 - 09/03 0700 In: 720 [P.O.:720] Out: 851 [Urine:850; Stool:1] Intake/Output this shift: Total I/O In: -  Out: 400 [Urine:400]  Lab Results: Recent Labs    08/05/22 2339 08/06/22 0517 08/07/22 0705 08/07/22 1250 08/07/22 2113 08/08/22 0540  WBC 6.2  --  5.7  --   --  5.4  HGB 11.5*   < > 8.0* 8.8* 7.9* 7.7*  HCT 33.9*   < > 24.0* 27.2* 23.7* 23.6*  PLT 274  --  198  --   --  197   < > = values in this interval not displayed.   BMET Recent Labs    08/06/22 0940 08/07/22 0708 08/08/22 0540  NA 138 137 138  K 4.3 3.9 4.1  CL 107 106 107  CO2 '23 24 25  '$ GLUCOSE 118* 86 99  BUN '16 22 17  '$ CREATININE 1.15* 1.38* 1.08*  CALCIUM 8.5* 8.0* 8.0*   LFT Recent Labs    08/05/22 1130 08/08/22 0540  PROT 6.5  --   ALBUMIN 3.4* 2.9*  AST 26  --   ALT 13  --   ALKPHOS 75  --   BILITOT 1.1  --    PT/INR Recent Labs    08/07/22 0708 08/08/22 0540  LABPROT 19.4* 18.8*  INR  1.7* 1.6*   Assessment / Plan: 61 year old female with history of atrial fibrillation, status post mitral valve and tricuspid valve repair, COPD and recent history of GI bleed, 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  She was admitted August 31 with acute respiratory failure, likely secondary to CHF +/- COPD exacerbation.  Her dyspnea has improved with diuresis, steroids and antibiotics.  She experienced 3 episodes of hematochezia in the last 36 hours, with a two-point drop in hemoglobin, which has been stable the last 2 hours.  Her last bowel movement was this morning.  She is currently tachycardic with soft blood pressures.   I suspect the patient is bleeding this time from her polypectomy site.  Often, these will resolve spontaneously.  Patient's hemoglobin has been stable for the past 12 hours, and her last BM was earlier this morning.  I am hopeful that her bleeding is starting to subside spontaneously.  She is fairly high risk from a sedation standpoint given her recent CHF exacerbation and COPD, therefore, I would  only want to repeat a colonoscopy if absolutely necessary.  9/3:  Her bleeding has definitely slowed down.  Had some bright red blood once this AM, no clots.  Hgb stable.   I recommend continued observation for now.  If her bleeding resolves spontaneously, no plans for repeat colonoscopy.  However, if she continues to bleed briskly with further significant drops in her hemoglobin, then we can plan for a repeat colonoscopy, most likely on Monday.   Hematochezia, suspected post-polypectomy bleed - Observe for now, hopeful bleeding will resolve spontaneously - Continue to hold heparin/Coumadin - Monitor Hgb and transfuse prn. -- Has already been advanced to full liquids.   Acute respiratory failure/CHF/COPD - Continue management per hospitalist     LOS: 3 days   Laban Emperor. Zehr  08/08/2022, 10:53 AM   I have  taken a history, reviewed the chart and examined the patient. I performed a substantive portion of this encounter, including complete performance of at least one of the key components, in conjunction with the APP. I agree with the APP's note, impression and recommendations  Bleeding appears to have slowed substantially if not stopped completely.  The patient really wants to eat.  I think this probably reasonable.  Will advance diet today. Would prefer to continue to hold anticoagulation for at least another to reduce risk of rebleeding, defer to cardiology team/hospitalist GI will follow peripherally tomorrow.  Carole Deere E. Candis Schatz, MD [facility]

## 2022-08-08 NOTE — Progress Notes (Signed)
Pt placed on BiPAP for the night with a pressure of 10/5 and 40% O2

## 2022-08-08 NOTE — Evaluation (Signed)
Physical Therapy Evaluation Patient Details Name: [name] MRN: [id number] DOB: 07-15-1961 Today's Date: 08/08/2022  History of Present Illness  61 year old female was admitted August 31 with acute respiratory failure, likely secondary to CHF +/- COPD exacerbation.  Her dyspnea has improved with diuresis, steroids and antibiotics.  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  Clinical Impression   Pt admitted with above diagnosis. Lives at home alone, in a single-level home with 1 steps to enter; Prior to admission, pt was able to manage modified independently, sitting down in rollator when needed, and using wheelchair when she feels dizzy; still driving, mostly getting out for appointments; got supplemetnal O2 last admission; uses it when she feels SOB; Presents to PT with decr activity tolerance, generalized soreness with getting up and moving, but overall moving well, and showing ability to self-monitor;  notably low Hgb today, so less distance; walked to bathroom with minguard/supervision assist; Pt currently with functional limitations due to the deficits listed below (see PT Problem List). Pt will benefit from skilled PT to increase their independence and safety with mobility to allow discharge to the venue listed below.       O2 sats decr to 85% with short amb on Room Air, and recovered back to low 90s with seated rest without the need for supplemetnal O2; will need to assess with hallway ambulation, and factor in Hgb values as well ( even with O2 sats at 100%, with low Hgb pt can be quite fatigued)     Recommendations for follow up therapy are one component of a  multi-disciplinary discharge planning process, led by the attending physician.  Recommendations may be updated based on patient status, additional functional criteria and insurance authorization.  Follow Up Recommendations Other (comment) (Consider Cardiac Rehab Phase 2)      Assistance Recommended at Discharge Set up Supervision/Assistance  Patient can return home with the following  Assistance with cooking/housework    Equipment Recommendations Other (comment);None recommended by PT (currently has supplemental O2 at home as well)  Recommendations for Other Services       Functional Status Assessment Patient has had a recent decline in their functional status and demonstrates the ability to make significant improvements in function in a reasonable and predictable amount of time.     Precautions / Restrictions Precautions Precautions: Fall Restrictions Weight Bearing Restrictions: No Other Position/Activity Restrictions: Chronic pain in back, hips and knees      Mobility  Bed Mobility Overal bed mobility: Modified Independent             General bed mobility comments: Increased time.    Transfers Overall transfer level: Needs assistance Equipment used: None Transfers: Sit to/from Stand, Bed to chair/wheelchair/BSC Sit to Stand: Supervision           General transfer comment: Supervision for safety and Cues to self-monitor for activity tolerance     Ambulation/Gait Ambulation/Gait assistance: Min guard (without physical contact) Gait Distance (Feet): 25 Feet (to bathroom and to sink) Assistive device: None Gait Pattern/deviations: Step-through pattern Gait velocity: approaching WFL     General Gait Details: Cues to self-monitor for activity tolerance; walked on room air and noted O2 sats decr to  85% briefly during walk to bathroom; incr back up to low 90s with seated break (on toilet)  Stairs            Wheelchair Mobility    Modified Rankin  (Stroke Patients Only)       Balance Overall balance assessment: No apparent balance deficits (not formally assessed)                                           Pertinent Vitals/Pain Pain Assessment Pain Assessment: Faces Faces Pain Scale: Hurts a little bit Pain Location: Generalized spreness Pain Descriptors / Indicators: Grimacing Pain Intervention(s): Monitored during session, Limited activity within patient's tolerance    Home Living Family/patient expects to be discharged to:: Private residence Living Arrangements: Alone Available Help at Discharge: Available PRN/intermittently Type of Home: Apartment Home Access: Level entry       Home Layout: One level Home Equipment: Rollator (4 wheels);Cane - single point;BSC/3in1;Wheelchair - manual;Grab bars - tub/shower;Shower seat;Tub bench;Hand held shower head      Prior Function Prior Level of Function : Independent/Modified Independent;Driving             Mobility Comments: USing rollator most of the time; uses WC when feeling dizzy ADLs Comments: sponge bathes     Hand Dominance   Dominant Hand: Right    Extremity/Trunk Assessment   Upper Extremity Assessment Upper Extremity Assessment:  (Noted decr shulder flexion bilaterally, with pain and soreness documented in OT assessment 8/23)    Lower Extremity Assessment Lower Extremity Assessment:  (Generalized soreness) LLE Deficits / Details: Increased knee pain and reported L knee felt like it may buckle during previous PT eval on 8/22; no overt signs of buckling with short walk within room today    Cervical / Trunk Assessment Cervical / Trunk Assessment: Normal  Communication   Communication: No difficulties  Cognition Arousal/Alertness: Awake/alert Behavior During Therapy: WFL for tasks assessed/performed Overall Cognitive Status: Within Functional Limits for tasks assessed                                           General Comments General comments (skin integrity, edema, etc.): Took time to describe sefl-monitoring for activity tolerance, including looking at O2 sats on the monitor; Also emphasized teh need to self-monitor, especially when Hgb is low    Exercises     Assessment/Plan    PT Assessment Patient needs continued PT services  PT Problem List Decreased strength;Decreased activity tolerance;Decreased balance;Decreased mobility;Cardiopulmonary status limiting activity       PT Treatment Interventions DME instruction;Gait training;Functional mobility training;Therapeutic activities;Therapeutic exercise;Cognitive remediation    PT Goals (Current goals can be found in the Care Plan section)  Acute Rehab PT Goals Patient Stated Goal: to be able to rest PT Goal Formulation: With patient Time For Goal Achievement: 08/22/22 Potential to Achieve Goals: Good    Frequency Min 3X/week     Co-evaluation               AM-PAC PT "6 Clicks" Mobility  Outcome Measure Help needed turning from your back to your side while in a flat bed without using bedrails?: None Help needed moving from lying on your back to sitting on the side of a flat bed without using bedrails?: None Help needed  moving to and from a bed to a chair (including a wheelchair)?: A Little Help needed standing up from a chair using your arms (e.g., wheelchair or bedside chair)?: None Help needed to walk in hospital room?: A Little Help needed climbing 3-5 steps with a railing? : A Little 6 Click Score: 21    End of Session   Activity Tolerance: Patient tolerated treatment well Patient left: in chair;with call bell/phone within reach;Other (comment) (sitting at sink to wash up) Nurse Communication: Mobility status PT Visit Diagnosis: Other abnormalities of gait and mobility (R26.89);Difficulty in walking, not elsewhere classified (R26.2)    Time: 7124-5809 PT Time Calculation (min) (ACUTE ONLY): 20 min   Charges:    PT Evaluation $PT Eval Low Complexity: [place], PT  Acute Rehabilitation Services Office [phone]   Colletta [place] 08/08/2022, 10:03 AM

## 2022-08-08 NOTE — Progress Notes (Signed)
Mobility Specialist: Progress Note   08/08/22 1530  Mobility  Activity Ambulated with assistance in hallway  Level of Assistance Standby assist, set-up cues, supervision of patient - no hands on  Assistive Device Front wheel walker  Distance Ambulated (ft) 380 ft  Activity Response Tolerated well  $Mobility charge 1 Mobility   Pt received in the bed and agreeable to mobility. C/o chronic pain during ambulation, otherwise asymptomatic. Pt back to bed after session with call bell and phone at her side.   [facility] Annaliese Saez Mobility Specialist Mobility Specialist [address]: [phone]

## 2022-08-08 NOTE — Progress Notes (Signed)
PROGRESS NOTE  [name] ASN:[id number] DOB: 12-21-1960   PCP: [facility] [place]  Patient is from: Home.  Lives alone.  Uses a walker to ambulate.  Has a wheelchair as well.  DOA: 08/05/2022 LOS: 3  Chief complaints Chief Complaint  Patient presents with   Shortness of Breath     Brief Narrative / Interim history: 61 year old F with PMH of systolic CHF, paroxysmal A-fib on warfarin, DM-2, mitral valve and tricuspid valve repair, HTN, recent hospitalization on 8/21 for GI bleed on 8/28 for syncope/dehydration returning with an acute dyspnea, orthopnea, cough, chest heaviness and elevated BP, and admitted for acute respiratory failure with hypoxia due to CHF exacerbation and hypertensive emergency.  Since she was taken off diuretics when she was last here for syncope and dehydration on 8/28.    In ED, BP 192/117.  HR 118.  RR 26.  Was on BiPAP and weaned to 4 L.  No documented desaturation.  Hgb 10.8.  Troponin 69.  BNP 1742.  CXR with vascular congestion, pulmonary interstitial edema and patchy opacities in the right base.  Received IV Lasix, vancomycin, cefepime and started on nitroglycerin.  Cardiology consulted and admitted.  Patient developed hematochezia.  Hgb trended from 10.8 to 7.7.  GI consulted.hematochezia felt to be post polypectomy from her colonoscopy on 8/25.  Hematochezia seems to have resolved after stopping anticoagulation.  Transfused 1 unit as well.  Subjective: Seen and examined earlier this morning.  No major events overnight of this morning.  No further hematochezia.  She had normal-looking bowel movement this morning.  She denies pain, shortness of breath, nausea or vomiting.  She is agreeable to 1 unit of blood transfusion today.  Objective: Vitals:   08/08/22 0426 08/08/22 0752 08/08/22 0804 08/08/22 1407  BP: 123/77  (!) 124/95 122/74  Pulse: 97  (!) 112 78  Resp:   14 (!) 23  Temp: 98 F (36.7 C)  98 F (36.7 C) 98.5 F (36.9 C)  TempSrc:  Oral     SpO2: 99% 97% 95% 95%  Weight: 90.2 kg     Height:        Examination: GENERAL: No apparent distress.  Nontoxic. HEENT: MMM.  Vision and hearing grossly intact.  NECK: Supple.  No apparent JVD.  RESP:  No IWOB.  Fair aeration bilaterally. CVS: Irregular rhythm.  Normal rate.  Heart sounds normal.  ABD/GI/GU: BS+. Abd soft, NTND.  MSK/EXT:  Moves extremities. No apparent deformity. No edema.  SKIN: no apparent skin lesion or wound NEURO: Awake and alert. Oriented appropriately.  No apparent focal neuro deficit. PSYCH: Calm. Normal affect.   Procedures:  None  Microbiology summarized: MRSA PCR screen nonreactive. COVID-19 PCR nonreactive.  Assessment and plan: Principal Problem:   Acute respiratory failure with hypoxia (HCC) Active Problems:   Acute on chronic systolic CHF (congestive heart failure) (HCC)   possible COPD with acute exacerbation (HCC)   Elevated troponin   Hypertensive emergency   Paroxysmal atrial fibrillation ([place])   S/P MVR/TVR  04/19/22   Type 2 diabetes mellitus without complication, without long-term current use of insulin (HCC)   Normocytic anemia   Hypercholesterolemia   Chronic pain syndrome   Anxiety and depression   Tobacco use   QT prolongation  Acute respiratory failure with hypoxia-89% on room air and required BiPAP on presentation. Likely due to CHF exacerbation.  Pro-Cal negative.  CTA negative for PE.  Chest pain likely musculoskeletal and resolved. -Wean off oxygen as able -Encourage incentive  spirometry -Manage CHF as below   Acute on chronic systolic CHF: Had dyspnea and orthopnea.  Had a JVD, crackles, vascular congestion on CXR and elevated BNP on presentation.  Echo and TEE just done. EF of 50-55%. MVR/TVR with no complications. (08/10/75 echo EF of 45-50%).  Diuresed with IV Lasix which will cause due to hypotension. -Cardiology managing-now on p.o. Lasix -Continue metoprolol and Aldactone. -Losartan discontinued. -Strict  I and O, daily weight, renal functions and electrolytes  Paroxysmal atrial fibrillation with mild RVR.  CHADS2 score at least 4.  -Hold anticoagulation in the setting of hematochezia -Continue metoprolol with parameters.  Acute blood loss anemia/lower GI bleed: Felt to be from polypectomy.  Anticoagulation discontinued.  Bleeding stopped. Recent Labs    08/04/22 1116 08/05/22 1130 08/05/22 2339 08/06/22 0517 08/06/22 0940 08/06/22 2118 08/07/22 0705 08/07/22 1250 08/07/22 2113 08/08/22 0540  HGB 9.3* 10.8* 11.5* 10.2* 10.1* 8.5* 8.0* 8.8* 7.9* 7.7*  -Transfuse 1 unit-verbally consented -Appreciate help by GI    Chronic COPD:  Pneumonia ruled out.   -Continue nebulizers -May need outpatient follow-up with pulmonology/PFT   Hypertensive emergency: Normotensive. -Cardiac meds as above   Elevated troponin: Suspect demand ischemia in the setting of CHF, hypertensive crisis and tachycardia versus non-STEMI.  Has chest pain per reproducible -Defer to cardiology.  S/P MVR/TVR on 04/19/22.  Due to RHD.  Recent TEE unremarkable.  Controlled NIDDM-2 with hyperglycemia and HLD: Recent A1c 6.0%.  Euglycemic. -Discontinue CBG monitoring and SSI  Chronic neck, upper and lower back pain: unchanged from baseline.  On oxycodone 15 mg every 6 hours as needed at home.  Narcotic database reviewed. -Continue oxycodone to 15 mg every 8 hours -Continue home gabapentin   Anxiety and depression -Continue wellbutrin and lexapro    Tobacco use -Nicotine patch -Encouraged cessation   Hypomagnesemia -Monitor replenish as appropriate   QT prolongation: QTc 512 but exaggerated by tachycardia. -Optimize electrolytes-repelete magnesium -Continue telemetry monitoring -Minimize QT prolonging drugs -Repeat EKG  Body mass index is 27.73 kg/m.           DVT prophylaxis:  Place and maintain sequential compression device Start: 08/07/22 1319  SCD  Code Status: Full code Family  Communication: None at bedside Level of care: Progressive Status is: Inpatient Remains inpatient appropriate because: Acute respiratory failure and acute systolic CHF, hypertension and acute blood loss anemia   Final disposition: Likely home once medically stable Consultants:  Cardiology Gastroenterology  Sch Meds:  Scheduled Meds:  sodium chloride   Intravenous Once   ARIPiprazole  5 mg Oral Daily   atorvastatin  20 mg Oral Daily   buPROPion  300 mg Oral q AM   escitalopram  10 mg Oral q morning   estrogens (conjugated)  0.45 mg Oral Daily   furosemide  20 mg Intravenous Once   furosemide  20 mg Oral Daily   gabapentin  300 mg Oral TID   hydrocortisone   Rectal BID   insulin aspart  0-9 Units Subcutaneous TID WC   ipratropium-albuterol  3 mL Nebulization BID   metoprolol tartrate  25 mg Oral BID   nicotine  14 mg Transdermal Daily   pantoprazole  40 mg Oral Daily   sodium chloride flush  3 mL Intravenous Q12H   spironolactone  12.5 mg Oral Daily   Continuous Infusions:  sodium chloride     PRN Meds:.sodium chloride, acetaminophen **OR** acetaminophen, levalbuterol, metoprolol tartrate, oxyCODONE, sodium chloride flush, traZODone  Antimicrobials: Anti-infectives (From admission, onward)  Start     Dose/Rate Route Frequency Ordered Stop   08/06/22 1400  vancomycin (VANCOREADY) IVPB 2000 mg/400 mL  Status:  Discontinued        2,000 mg 200 mL/hr over 120 Minutes Intravenous Every 24 hours 08/05/22 1331 08/06/22 1606   08/05/22 1345  vancomycin (VANCOREADY) IVPB 2000 mg/400 mL        2,000 mg 200 mL/hr over 120 Minutes Intravenous  Once 08/05/22 1325 08/05/22 1655   08/05/22 1300  ceFEPIme (MAXIPIME) 2 g in sodium chloride 0.9 % 100 mL IVPB        2 g 200 mL/hr over 30 Minutes Intravenous  Once 08/05/22 1256 08/05/22 1527        I have personally reviewed the following labs and images: CBC: Recent Labs  Lab 08/03/22 0047 08/03/22 0339 08/04/22 1116  08/05/22 1130 08/05/22 2339 08/06/22 0517 08/06/22 2118 08/07/22 0705 08/07/22 1250 08/07/22 2113 08/08/22 0540  WBC 6.0   < > 5.6 7.0 6.2  --   --  5.7  --   --  5.4  NEUTROABS 3.0  --   --   --   --   --   --   --   --   --   --   HGB 9.2*   < > 9.3* 10.8* 11.5*   < > 8.5* 8.0* 8.8* 7.9* 7.7*  HCT 29.6*   < > 29.0* 32.9* 33.9*   < > 24.8* 24.0* 27.2* 23.7* 23.6*  MCV 92.8   < > 90.6 87.0 84.5  --   --  87.3  --   --  88.1  PLT 223   < > 203 270 274  --   --  198  --   --  197   < > = values in this interval not displayed.   BMP &GFR Recent Labs  Lab 08/05/22 1130 08/05/22 1430 08/05/22 2339 08/06/22 0940 08/07/22 0708 08/08/22 0540  NA 141  --  138 138 137 138  K 4.3  --  4.2 4.3 3.9 4.1  CL 109  --  105 107 106 107  CO2 23  --  19* '23 24 25  '$ GLUCOSE 102*  --  127* 118* 86 99  BUN 11  --  '15 16 22 17  '$ CREATININE 1.08*  --  1.13* 1.15* 1.38* 1.08*  CALCIUM 8.5*  --  8.7* 8.5* 8.0* 8.0*  MG  --  1.4* 1.7  --  1.5* 1.5*  PHOS  --   --   --   --   --  3.3   Estimated Creatinine Clearance: 67 mL/min (A) (by C-G formula based on SCr of 1.08 mg/dL (H)). Liver & Pancreas: Recent Labs  Lab 08/03/22 0047 08/05/22 1130 08/08/22 0540  AST 21 26  --   ALT 12 13  --   ALKPHOS 57 75  --   BILITOT 0.6 1.1  --   PROT 5.9* 6.5  --   ALBUMIN 3.3* 3.4* 2.9*   No results for input(s): "LIPASE", "AMYLASE" in the last 168 hours. No results for input(s): "AMMONIA" in the last 168 hours. Diabetic: No results for input(s): "HGBA1C" in the last 72 hours. Recent Labs  Lab 08/07/22 1155 08/07/22 1635 08/07/22 2245 08/08/22 0635 08/08/22 1059  GLUCAP 85 88 92 104* 105*   Cardiac Enzymes: Recent Labs  Lab 08/07/22 0708  CKTOTAL 29*   No results for input(s): "PROBNP" in the last 8760 hours. Coagulation Profile: Recent Labs  Lab 08/05/22 1130 08/05/22 1812 08/06/22 0940 08/07/22 0708 08/08/22 0540  INR 1.4* 1.4* 1.7* 1.7* 1.6*   Thyroid Function Tests: No results  for input(s): "TSH", "T4TOTAL", "FREET4", "T3FREE", "THYROIDAB" in the last 72 hours. Lipid Profile: No results for input(s): "CHOL", "HDL", "LDLCALC", "TRIG", "CHOLHDL", "LDLDIRECT" in the last 72 hours. Anemia Panel: Recent Labs    08/07/22 0705 08/07/22 0708  VITAMINB12  --  879  FOLATE 11.0  --   FERRITIN  --  54  TIBC  --  266  IRON  --  28  RETICCTPCT 3.5*  --    Urine analysis:    Component Value Date/Time   COLORURINE YELLOW 07/23/2022 0109   APPEARANCEUR HAZY (A) 07/23/2022 0109   LABSPEC 1.010 07/23/2022 0109   PHURINE 5.0 07/23/2022 0109   GLUCOSEU NEGATIVE 07/23/2022 0109   GLUCOSEU NEG mg/dL 01/09/2009 2129   HGBUR NEGATIVE 07/23/2022 0109   HGBUR negative 02/27/2007 1327   BILIRUBINUR NEGATIVE 07/23/2022 0109   KETONESUR NEGATIVE 07/23/2022 0109   PROTEINUR NEGATIVE 07/23/2022 0109   UROBILINOGEN 1.0 06/10/2010 1729   NITRITE NEGATIVE 07/23/2022 0109   LEUKOCYTESUR NEGATIVE 07/23/2022 0109   Sepsis Labs: Invalid input(s): "PROCALCITONIN", "LACTICIDVEN"  Microbiology: Recent Results (from the past 240 hour(s))  SARS Coronavirus 2 by RT PCR (hospital order, performed in [facility] hospital lab) *cepheid single result test* Anterior Nasal Swab     Status: None   Collection Time: 08/05/22  1:08 PM   Specimen: Anterior Nasal Swab  Result Value Ref Range Status   SARS Coronavirus 2 by RT PCR NEGATIVE NEGATIVE Final    Comment: (NOTE) SARS-CoV-2 target nucleic acids are NOT DETECTED.  The SARS-CoV-2 RNA is generally detectable in upper and lower respiratory specimens during the acute phase of infection. The lowest concentration of SARS-CoV-2 viral copies this assay can detect is 250 copies / mL. A negative result does not preclude SARS-CoV-2 infection and should not be used as the sole basis for treatment or other patient management decisions.  A negative result may occur with improper specimen collection / handling, submission of specimen other than  nasopharyngeal swab, presence of viral mutation(s) within the areas targeted by this assay, and inadequate number of viral copies (<250 copies / mL). A negative result must be combined with clinical observations, patient history, and epidemiological information.  Fact Sheet for Patients:   https://www.patel.info/  Fact Sheet for Healthcare Providers: https://hall.com/  This test is not yet approved or  cleared by the [place] FDA and has been authorized for detection and/or diagnosis of SARS-CoV-2 by FDA under an Emergency Use Authorization (EUA).  This EUA will remain in effect (meaning this test can be used) for the duration of the COVID-19 declaration under Section 564(b)(1) of the Act, 21 U.S.C. section 360bbb-3(b)(1), unless the authorization is terminated or revoked sooner.  Performed at [facility], [place] [address], [place], [place] [address]   MRSA Next Gen by PCR, Nasal     Status: None   Collection Time: 08/05/22  1:30 PM   Specimen: Nasal Mucosa; Nasal Swab  Result Value Ref Range Status   MRSA by PCR Next Gen NOT DETECTED NOT DETECTED Final    Comment: (NOTE) The GeneXpert MRSA Assay (FDA approved for NASAL specimens only), is one component of a comprehensive MRSA colonization surveillance program. It is not intended to diagnose MRSA infection nor to guide or monitor treatment for MRSA infections. Test performance is not FDA approved in patients less than 2  years old. Performed at [facility], [place] [address] [place], [place] [address]     Radiology Studies: No results found.    [name] T. [place]  If 7PM-7AM, please contact night-coverage [email or web address] 08/08/2022, 2:12 PM

## 2022-08-08 NOTE — Progress Notes (Signed)
Rounding Note    Patient Name: [name] Date of Encounter: 08/08/2022  [place] Cardiologist: Werner Lean, MD   Subjective   No more blood in stool. Feeling better with normotension today.   Inpatient Medications    Scheduled Meds:  ARIPiprazole  5 mg Oral Daily   atorvastatin  20 mg Oral Daily   buPROPion  300 mg Oral q AM   escitalopram  10 mg Oral q morning   estrogens (conjugated)  0.45 mg Oral Daily   furosemide  40 mg Intravenous Daily   gabapentin  300 mg Oral TID   hydrocortisone   Rectal BID   insulin aspart  0-9 Units Subcutaneous TID WC   ipratropium-albuterol  3 mL Nebulization BID   metoprolol tartrate  25 mg Oral BID   nicotine  14 mg Transdermal Daily   pantoprazole  40 mg Oral Daily   sodium chloride flush  3 mL Intravenous Q12H   spironolactone  12.5 mg Oral Daily   Continuous Infusions:  sodium chloride     PRN Meds: sodium chloride, acetaminophen **OR** acetaminophen, levalbuterol, metoprolol tartrate, oxyCODONE, sodium chloride flush, traZODone   Vital Signs    Vitals:   08/08/22 0105 08/08/22 0426 08/08/22 0752 08/08/22 0804  BP: 96/68 123/77  (!) 124/95  Pulse: 75 97  (!) 112  Resp:    14  Temp: (!) 97.5 F (36.4 C) 98 F (36.7 C)  98 F (36.7 C)  TempSrc: Oral Oral    SpO2:  99% 97% 95%  Weight:  90.2 kg    Height:        Intake/Output Summary (Last 24 hours) at 08/08/2022 0835 Last data filed at 08/08/2022 0400 Gross per 24 hour  Intake 720 ml  Output 851 ml  Net -131 ml      08/08/2022    4:26 AM 08/07/2022    5:53 AM 08/06/2022    3:30 AM  Last 3 Weights  Weight (lbs) 198 lb 12.8 oz 200 lb 3.2 oz 199 lb 14.4 oz  Weight (kg) 90.175 kg 90.81 kg 90.674 kg      Telemetry    Sinus rhythm with runs of SVT/AT and PVCs- Personally Reviewed  ECG   ST PVCs- Personally Reviewed  Physical Exam   GEN: No acute distress.   Neck: No JVD Cardiac: iRRR, 1/6 systolic murmur  Respiratory: Clear to  auscultation bilaterally. GI: Soft, nontender, non-distended  MS: No edema; No deformity. Neuro:  Nonfocal  Psych: Normal affect   Labs    High Sensitivity Troponin:   Recent Labs  Lab 08/03/22 0339 08/05/22 1130 08/05/22 1430 08/05/22 1812 08/05/22 2038  TROPONINIHS 49* 69* 143* 191* 190*     Chemistry Recent Labs  Lab 08/03/22 0047 08/04/22 1116 08/05/22 1130 08/05/22 1430 08/05/22 2339 08/06/22 0940 08/07/22 0708 08/08/22 0540  NA 133*   < > 141  --  138 138 137 138  K 4.1   < > 4.3  --  4.2 4.3 3.9 4.1  CL 102   < > 109  --  105 107 106 107  CO2 19*   < > 23  --  19* '23 24 25  '$ GLUCOSE 84   < > 102*  --  127* 118* 86 99  BUN 26*   < > 11  --  '15 16 22 17  '$ CREATININE 3.14*   < > 1.08*  --  1.13* 1.15* 1.38* 1.08*  CALCIUM 7.3*   < >  8.5*  --  8.7* 8.5* 8.0* 8.0*  MG  --   --   --    < > 1.7  --  1.5* 1.5*  PROT 5.9*  --  6.5  --   --   --   --   --   ALBUMIN 3.3*  --  3.4*  --   --   --   --  2.9*  AST 21  --  26  --   --   --   --   --   ALT 12  --  13  --   --   --   --   --   ALKPHOS 57  --  75  --   --   --   --   --   BILITOT 0.6  --  1.1  --   --   --   --   --   GFRNONAA 16*   < > 58*  --  55* 54* 43* 58*  ANIONGAP 12   < > 9  --  '14 8 7 6   '$ < > = values in this interval not displayed.    Lipids No results for input(s): "CHOL", "TRIG", "HDL", "LABVLDL", "LDLCALC", "CHOLHDL" in the last 168 hours.  Hematology Recent Labs  Lab 08/05/22 2339 08/06/22 0517 08/07/22 0705 08/07/22 1250 08/07/22 2113 08/08/22 0540  WBC 6.2  --  5.7  --   --  5.4  RBC 4.01  --  2.75*  2.77*  --   --  2.68*  HGB 11.5*   < > 8.0* 8.8* 7.9* 7.7*  HCT 33.9*   < > 24.0* 27.2* 23.7* 23.6*  MCV 84.5  --  87.3  --   --  88.1  MCH 28.7  --  29.1  --   --  28.7  MCHC 33.9  --  33.3  --   --  32.6  RDW 16.5*  --  17.0*  --   --  16.8*  PLT 274  --  198  --   --  197   < > = values in this interval not displayed.   Thyroid  Recent Labs  Lab 08/03/22 0713  TSH 1.840     BNP Recent Labs  Lab 08/05/22 1130 08/07/22 0658  BNP 1,742.3* 526.5*    DDimer No results for input(s): "DDIMER" in the last 168 hours.   Radiology    No results found.  Cardiac Studies   ECHO TEE:  07/28/2022  1. Left ventricular ejection fraction, by estimation, is 60 to 65%. The  left ventricle has normal function. The left ventricle has no regional  wall motion abnormalities. There is mild concentric left ventricular  hypertrophy. Left ventricular diastolic function could not be evaluated.   2. Right ventricular systolic function is normal. The right ventricular  size is normal. There is moderately elevated pulmonary artery systolic  pressure. The estimated right ventricular systolic pressure is 48.1 mmHg.   3. Left atrial size was severely dilated. No left atrial/left atrial  appendage thrombus was detected. The LAA emptying velocity was 70 cm/s.   4. Right atrial size was mildly dilated.   5. The mitral valve has been repaired/replaced. Mild mitral valve  regurgitation. Mild mitral stenosis. The mean mitral valve gradient is 6.6  mmHg with average heart rate of 83 bpm. There is a prosthetic annuloplasty  ring present in the mitral position. MV Procedure Date: 04/19/22.   6. The tricuspid valve  is has been repaired/replaced. The tricuspid valve  is status post repair with an annuloplasty ring. Tricuspid valve  regurgitation is moderate.   7. The aortic valve is tricuspid. Aortic valve regurgitation is not  visualized. No aortic stenosis is present.   Comparison(s): A "double envelope" or "ghosting" artifact is seen during  CW interrogation of the mitral inflow. This may have led to overestimation  of the transmitral gradient on the previous study.    ECHO: 07/27/2022  1. Left ventricular ejection fraction, by estimation, is 50 to 55%. The  left ventricle has low normal function.   2. Left atrial size was severely dilated.   3. Right atrial size was moderately  dilated.   4. S/p MV prosthetic annuloplasty ring placed 04/19/2022. mod-severe  mitral stenosis, MV mean gradient 15 mmHg, peak 27 mmhg at HR 87 bpm. Mild  mitral valve regurgitation. There is a present in the mitral position.  Procedure Date: 04/19/22.   5. 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  Tricuspid valve regurgitation is moderate.   6. Aortic valve regurgitation is not visualized.   7. There is severely elevated pulmonary artery systolic pressure.   Conclusion(s)/Recommendation(s): Compared to prior study, MV mean gradient  has increased. May be related to rate. Consider TEE/cardiac CT if  clinically indicated.    CARDIAC CATH: 02/12/2022 Mild pulmonary hypertension with mean PA pressure 31 mmHg.  Mean pulmonary wedge pressure 23 mmHg with a V wave 41 mmHg.  WHO group 2 pulmonary hypertension. Pulmonary vascular resistance 0.99 Woods units Left ventricular end diastolic pressure 35 mmHg, consistent with diastolic heart failure. Right dominant coronary anatomy.  Widely patent coronaries without obstructive disease. Previously documented severe mitral regurgitation.    Patient Profile     61 y.o. female  with a hx of DM2, HTN, HFpEF, DM neuropathy, GERD, Hep C, s/p MVR/TV repair c/b cardiac tamponade w/p pericardiocentesis 05/10/2022, PAF, tob use, GIB w/ angiodysplasia s/p polyp removal.  Assessment & Plan    Principal Problem:   Acute respiratory failure with hypoxia (HCC) Active Problems:   Type 2 diabetes mellitus without complication, without long-term current use of insulin (HCC)   Chronic pain syndrome   Tobacco use   Paroxysmal atrial fibrillation (HCC)   Hypercholesterolemia   S/P MVR/TVR  04/19/22   Normocytic anemia   Acute on chronic systolic CHF (congestive heart failure) (HCC)   Elevated troponin   Hypertensive emergency   possible COPD with acute exacerbation (HCC)   Anxiety and depression   QT prolongation  Acute on  chronic diastolic heart failure Acute kidney injury Hypertension Paroxysmal atrial fibrillation GI bleeding - no further bleeding. Remains anemic.  - appears euvolemic today. Resume oral lasix 20 mg daily. She did not have robust diuresis by I/O and did have signs of HF on presentation, but clinically appears better and hypotension and AKI improved after holding diuretics yesterday. Therefore, likely can resume home lasix regimen. Not requiring supp O2 since 9/1.   Normotensive today. Metoprolol resumed. On spironolactone.   Paroxysmal atrial fibrillation-she is in sinus rhythm/sinus tachy but has frequent runs of AT/SVT and some PVCs. Metoprolol has been held due to hypotension, but now resumed. She is asymptomatic with this, but can uptitrate metoprolol if needed.   We discussed HF homegoing recommendations including daily weights and HF diet. She will require close follow up to avoid repeated hospitalizations.   I will replete her magnesium today. 2 g IV.  For questions or updates,  please contact [place] Please consult [email or web address] for contact info under        Signed, Elouise Munroe, MD  08/08/2022, 8:35 AM

## 2022-08-09 DIAGNOSIS — R778 Other specified abnormalities of plasma proteins: Secondary | ICD-10-CM | POA: Diagnosis not present

## 2022-08-09 DIAGNOSIS — J9601 Acute respiratory failure with hypoxia: Secondary | ICD-10-CM | POA: Diagnosis not present

## 2022-08-09 DIAGNOSIS — I5023 Acute on chronic systolic (congestive) heart failure: Secondary | ICD-10-CM | POA: Diagnosis not present

## 2022-08-09 DIAGNOSIS — I161 Hypertensive emergency: Secondary | ICD-10-CM | POA: Diagnosis not present

## 2022-08-09 LAB — CBC
HCT: 28.2 % — ABNORMAL LOW (ref 36.0–46.0)
Hemoglobin: 9.3 g/dL — ABNORMAL LOW (ref 12.0–15.0)
MCH: 28.9 pg (ref 26.0–34.0)
MCHC: 33 g/dL (ref 30.0–36.0)
MCV: 87.6 fL (ref 80.0–100.0)
Platelets: 198 10*3/uL (ref 150–400)
RBC: 3.22 MIL/uL — ABNORMAL LOW (ref 3.87–5.11)
RDW: 16.2 % — ABNORMAL HIGH (ref 11.5–15.5)
WBC: 4.8 10*3/uL (ref 4.0–10.5)
nRBC: 0 % (ref 0.0–0.2)

## 2022-08-09 LAB — MAGNESIUM: Magnesium: 1.7 mg/dL (ref 1.7–2.4)

## 2022-08-09 LAB — BPAM RBC
Blood Product Expiration Date: 202309212359
ISSUE DATE / TIME: 202309031413
Unit Type and Rh: 7300

## 2022-08-09 LAB — RENAL FUNCTION PANEL
Albumin: 3.1 g/dL — ABNORMAL LOW (ref 3.5–5.0)
Anion gap: 8 (ref 5–15)
BUN: 13 mg/dL (ref 8–23)
CO2: 23 mmol/L (ref 22–32)
Calcium: 8.3 mg/dL — ABNORMAL LOW (ref 8.9–10.3)
Chloride: 106 mmol/L (ref 98–111)
Creatinine, Ser: 1.06 mg/dL — ABNORMAL HIGH (ref 0.44–1.00)
GFR, Estimated: 59 mL/min — ABNORMAL LOW (ref >60)
Glucose, Bld: 93 mg/dL (ref 70–99)
Phosphorus: 3.2 mg/dL (ref 2.5–4.6)
Potassium: 3.8 mmol/L (ref 3.5–5.1)
Sodium: 137 mmol/L (ref 135–145)

## 2022-08-09 LAB — TYPE AND SCREEN
ABO/RH(D): B POS
Antibody Screen: NEGATIVE
Unit division: 0

## 2022-08-09 LAB — PROTIME-INR
INR: 1.5 — ABNORMAL HIGH (ref 0.8–1.2)
Prothrombin Time: 17.6 seconds — ABNORMAL HIGH (ref 11.4–15.2)

## 2022-08-09 MED ORDER — WARFARIN SODIUM 5 MG PO TABS
5.0000 mg | ORAL_TABLET | Freq: Once | ORAL | Status: AC
  Administered 2022-08-09: 5 mg via ORAL
  Filled 2022-08-09: qty 1

## 2022-08-09 MED ORDER — MAGNESIUM SULFATE 2 GM/50ML IV SOLN
2.0000 g | Freq: Once | INTRAVENOUS | Status: AC
  Administered 2022-08-09: 2 g via INTRAVENOUS
  Filled 2022-08-09: qty 50

## 2022-08-09 MED ORDER — LOSARTAN POTASSIUM 50 MG PO TABS
50.0000 mg | ORAL_TABLET | Freq: Every day | ORAL | Status: DC
  Administered 2022-08-09: 50 mg via ORAL
  Filled 2022-08-09: qty 1

## 2022-08-09 MED ORDER — WARFARIN - PHARMACIST DOSING INPATIENT: Freq: Every day | Status: DC

## 2022-08-09 NOTE — Care Management Important Message (Signed)
Important Message  Patient Details  Name: [name] MRN: [id number] Date of Birth: [birth year]   Medicare Important Message Given:  Yes     [name] 08/09/2022, 8:35 AM

## 2022-08-09 NOTE — Evaluation (Signed)
Occupational Therapy Evaluation Patient Details Name: [name] MRN: [id number] DOB: 09/05/1961 Today's Date: 08/09/2022   History of Present Illness 61 year old female was admitted August 31 with acute respiratory failure, likely secondary to CHF +/- COPD exacerbation.  Her dyspnea has improved with diuresis, steroids and antibiotics.  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   Clinical Impression   Pt currently modified independent for toileting and simulated selfcare tasks sit to stand.  No further acute or post acute OT needs at this time.        Recommendations for follow up therapy are one component of a multi-disciplinary discharge planning process, led by the attending physician.  Recommendations may be updated based on patient status, additional functional criteria and insurance authorization.   Follow Up Recommendations  No OT follow up    Assistance Recommended at Discharge None     Functional Status Assessment  Patient has not had a recent decline in their functional status  Equipment Recommendations  None recommended by OT       Precautions / Restrictions Precautions Precautions: Fall Restrictions Weight Bearing Restrictions: No      Mobility Bed Mobility                    Transfers Overall transfer level: Modified independent Equipment used: Rolling walker (2 wheels) Transfers: Sit to/from Stand, Bed to chair/wheelchair/BSC Sit to Stand: Modified independent (Device/Increase time)     Step pivot transfers: Modified independent (Device/Increase time)            Balance Overall balance assessment: Mild deficits  observed, not formally tested                                         ADL either performed or assessed with clinical judgement   ADL Overall ADL's : Needs assistance/impaired Eating/Feeding: Independent;Sitting   Grooming: Wash/dry hands;Wash/dry face;Oral care;Modified independent   Upper Body Bathing: Modified independent;Sitting   Lower Body Bathing: Modified independent;Sit to/from stand   Upper Body Dressing : Modified independent;Sitting   Lower Body Dressing: Modified independent;Sit to/from stand   Toilet Transfer: Systems analyst;Ambulation;Rolling walker (2 wheels)   Toileting- Clothing Manipulation and Hygiene: Modified independent;Sit to/from stand       Functional mobility during ADLs: Modified independent;Rolling walker (2 wheels) General ADL Comments: Pt currently modified independent for mobility and selfcare tasks sit to stand with use of the RW.  Pt with decreased activity at home baseline.  She reports staying in the bed mostly and only getting up when she needs something or has appointments.  She does drive and get her groceries as well.     Vision Baseline Vision/History: 0 No visual deficits Ability to See in Adequate Light: 0 Adequate Patient Visual Report: No change from baseline Vision Assessment?: No apparent visual deficits     Perception Perception Perception: Not tested   Praxis Praxis Praxis: Not tested    Pertinent Vitals/Pain Pain Assessment Pain Assessment: 0-10 Pain Score: 7  Pain Location: generalized Pain Intervention(s): Limited activity within patient's tolerance, Monitored during session     Hand  Dominance (P) Right   Extremity/Trunk Assessment Upper Extremity Assessment Upper Extremity Assessment: Generalized weakness (strength 3+/5 throughout)   Lower Extremity Assessment Lower Extremity Assessment: Defer to PT evaluation   Cervical / Trunk Assessment Cervical / Trunk Assessment:  Normal   Communication Communication Communication: (P) No difficulties   Cognition Arousal/Alertness: Awake/alert Behavior During Therapy: WFL for tasks assessed/performed Overall Cognitive Status: Within Functional Limits for tasks assessed                                       General Comments  Pt needs use of the RW for support during mobility in order to increase safety.            Home Living Family/patient expects to be discharged to:: (P) Private residence Living Arrangements: (P) Alone Available Help at Discharge: (P) Available PRN/intermittently (friends check on her) Type of Home: (P) Apartment Home Access: (P) Level entry     Home Layout: (P) One level     Bathroom Shower/Tub: (P) Tub/shower unit   Bathroom Toilet: (P) Standard Bathroom Accessibility: (P) Yes   Home Equipment: (P) Rollator (4 wheels);Cane - single point;BSC/3in1;Wheelchair - manual;Grab bars - tub/shower;Shower seat;Hand held shower head          Prior Functioning/Environment Prior Level of Function : (P) Independent/Modified Independent;Driving             Mobility Comments: (P) USing rollator most of the time; uses WC when feeling dizzy ADLs Comments: (P) sponge bathes                           AM-PAC OT "6 Clicks" Daily Activity     Outcome Measure Help from another person eating meals?: None Help from another person taking care of personal grooming?: None Help from another person toileting, which includes using toliet, bedpan, or urinal?: None Help from another person bathing (including washing, rinsing, drying)?: None Help from another person to put on and taking off regular upper body clothing?: None Help from another person to put on and taking off regular lower body clothing?: None 6 Click Score: 24   End of Session Equipment Utilized During Treatment: Rolling walker (2 wheels) Nurse Communication: Mobility status  Activity Tolerance: Patient  tolerated treatment well Patient left: in chair;with call bell/phone within reach                   Time: 7124-5809 OT Time Calculation (min): 24 min Charges:  OT General Charges $OT Visit: 1 Visit OT Evaluation $OT Eval Moderate Complexity: 1 Mod OT Treatments $Self Care/Home Management : 8-22 mins Deagan Sevin OTR/L 08/09/2022, 10:37 AM

## 2022-08-09 NOTE — Progress Notes (Signed)
PROGRESS NOTE  [name] JSE:[id number] DOB: 1960/12/24   PCP: [facility] [place]  Patient is from: Home.  Lives alone.  Uses a walker to ambulate.  Has a wheelchair as well.  DOA: 08/05/2022 LOS: 4  Chief complaints Chief Complaint  Patient presents with   Shortness of Breath     Brief Narrative / Interim history: 61 year old F with PMH of systolic CHF, paroxysmal A-fib with valvular disease on warfarin, DM-2, mitral valve and tricuspid valve repair, HTN, recent hospitalization on 8/21 for GI bleed on 8/28 for syncope/dehydration returning with an acute dyspnea, orthopnea, cough, chest heaviness and elevated BP, and admitted for acute respiratory failure with hypoxia due to CHF exacerbation and hypertensive emergency.  Since she was taken off diuretics when she was last here for syncope and dehydration on 8/28.    In ED, BP 192/117.  HR 118.  RR 26.  Was on BiPAP and weaned to 4 L.  No documented desaturation.  Hgb 10.8.  Troponin 69.  BNP 1742.  CXR with vascular congestion, pulmonary interstitial edema and patchy opacities in the right base.  Received IV Lasix, vancomycin, cefepime and started on nitroglycerin.  Cardiology consulted and admitted.  Patient developed hematochezia.  Hgb trended from 10.8 to 7.7.  GI consulted. Hematochezia felt to be post polypectomy from her colonoscopy on 8/25.  Transfused 1 unit with appropriate response.  No further hematochezia after stopping anticoagulation.  9/4-warfarin resumed after discussion with GI.  Subjective: Seen and examined earlier this morning.  No major events overnight of this morning.  She has normal-looking bowel movement this morning.  Denies melena or hematochezia.  Feels well.  Objective: Vitals:   08/08/22 2322 08/09/22 0415 08/09/22 0747 08/09/22 0805  BP:  (!) 125/99 (!) 123/98   Pulse: 92 99 88   Resp: '13 20 20   '$ Temp:  97.7 F (36.5 C) 98.6 F (37 C)   TempSrc:  Oral Oral   SpO2: 100% 94% 100% 100%   Weight:  88.3 kg    Height:        Examination:  GENERAL: No apparent distress.  Nontoxic. HEENT: MMM.  Vision and hearing grossly intact.  NECK: Supple.  No apparent JVD.  RESP:  No IWOB.  Fair aeration bilaterally. CVS: Irregular rhythm.  Normal rate.  Heart sounds normal.  ABD/GI/GU: BS+. Abd soft, NTND.  MSK/EXT:  Moves extremities. No apparent deformity. No edema.  SKIN: no apparent skin lesion or wound NEURO: Awake and alert. Oriented appropriately.  No apparent focal neuro deficit. PSYCH: Calm. Normal affect.   Procedures:  None  Microbiology summarized: MRSA PCR screen nonreactive. COVID-19 PCR nonreactive.  Assessment and plan: Principal Problem:   Acute respiratory failure with hypoxia (HCC) Active Problems:   Acute on chronic systolic CHF (congestive heart failure) (HCC)   possible COPD with acute exacerbation (HCC)   Elevated troponin   Hypertensive emergency   Paroxysmal atrial fibrillation ([place])   S/P MVR/TVR  04/19/22   Type 2 diabetes mellitus without complication, without long-term current use of insulin (HCC)   Normocytic anemia   Hypercholesterolemia   Chronic pain syndrome   Anxiety and depression   Tobacco use   QT prolongation  Acute respiratory failure with hypoxia-89% on room air and required BiPAP on presentation. Likely due to CHF exacerbation.  Pro-Cal negative.  CTA negative for PE.  Chest pain likely musculoskeletal and resolved.  Respiratory failure resolved. -Encourage incentive spirometry -Manage CHF as below   Acute on chronic systolic  CHF: Had dyspnea and orthopnea.  Had a JVD, crackles, vascular congestion on CXR and elevated BNP on presentation.  Echo and TEE just done. EF of 50-55%. MVR/TVR with no complications. (01/11/04 echo EF of 45-50%).  Diuresed with IV Lasix. -Cardiology managing-now on p.o. Lasix -Continue metoprolol and Aldactone. -Resume losartan at lower dose now hypotension resolved. -Strict I and O, daily weight,  renal functions and electrolytes  Paroxysmal A-fib with mild RVR.  CHADS2 score at least 4.  Has valvular heart disease.  INR 1.5. -Okay to resume warfarin per GI.  Not a candidate for DOAC due to valvular heart disease. -Continue metoprolol with parameters.  Acute blood loss anemia/lower GI bleed: Felt to be from polypectomy.  Bleeding stopped after discontinuing AC.  Transfused 1 unit with appropriate response. Recent Labs    08/05/22 2339 08/06/22 0517 08/06/22 0940 08/06/22 2118 08/07/22 0705 08/07/22 1250 08/07/22 2113 08/08/22 0540 08/08/22 1802 08/09/22 0730  HGB 11.5* 10.2* 10.1* 8.5* 8.0* 8.8* 7.9* 7.7* 10.2* 9.3*  -Appreciate help by GI  Chronic COPD:  Pneumonia ruled out.   -Continue nebulizers -May need outpatient follow-up with pulmonology/PFT   Hypertensive emergency: Normotensive. -Cardiac meds as above   Elevated troponin: Suspect demand ischemia in the setting of CHF, hypertensive crisis and tachycardia versus non-STEMI.  Has chest pain per reproducible -Defer to cardiology.  S/P MVR/TVR on 04/19/22.  Due to RHD.  Recent TEE unremarkable.  Controlled NIDDM-2 with hyperglycemia and HLD: Recent A1c 6.0%.  Euglycemic. -Discontinue CBG monitoring and SSI  Chronic neck, upper and lower back pain: unchanged from baseline.  On oxycodone 15 mg every 6 hours as needed at home.  Narcotic database reviewed. -Continue oxycodone to 15 mg every 8 hours -Continue home gabapentin   Anxiety and depression -Continue wellbutrin and lexapro    Tobacco use -Nicotine patch -Encouraged cessation   Hypomagnesemia -Monitor replenish as appropriate   QT prolongation: QTc 512 but exaggerated by tachycardia. -Optimize electrolytes-repelete magnesium -Continue telemetry monitoring -Minimize QT prolonging drugs -Repeat EKG  Body mass index is 27.16 kg/m.           DVT prophylaxis:  Place and maintain sequential compression device Start: 08/07/22 1319  On warfarin  for anticoagulation  Code Status: Full code Family Communication: None at bedside Level of care: Telemetry Cardiac Status is: Inpatient Remains inpatient appropriate because: Acute blood loss anemia/GI bleed, acute CHF   Final disposition: Likely home in the next 24 to 48 hours Consultants:  Cardiology Gastroenterology  Sch Meds:  Scheduled Meds:  ARIPiprazole  5 mg Oral Daily   atorvastatin  20 mg Oral Daily   buPROPion  300 mg Oral q AM   escitalopram  10 mg Oral q morning   estrogens (conjugated)  0.45 mg Oral Daily   furosemide  20 mg Oral Daily   gabapentin  300 mg Oral TID   hydrocortisone   Rectal BID   ipratropium-albuterol  3 mL Nebulization BID   metoprolol tartrate  25 mg Oral BID   nicotine  14 mg Transdermal Daily   pantoprazole  40 mg Oral Daily   sodium chloride flush  3 mL Intravenous Q12H   spironolactone  12.5 mg Oral Daily   Continuous Infusions:  sodium chloride     PRN Meds:.sodium chloride, acetaminophen **OR** acetaminophen, levalbuterol, metoprolol tartrate, oxyCODONE, sodium chloride flush, traZODone  Antimicrobials: Anti-infectives (From admission, onward)    Start     Dose/Rate Route Frequency Ordered Stop   08/06/22 1400  vancomycin (VANCOREADY)  IVPB 2000 mg/400 mL  Status:  Discontinued        2,000 mg 200 mL/hr over 120 Minutes Intravenous Every 24 hours 08/05/22 1331 08/06/22 1606   08/05/22 1345  vancomycin (VANCOREADY) IVPB 2000 mg/400 mL        2,000 mg 200 mL/hr over 120 Minutes Intravenous  Once 08/05/22 1325 08/05/22 1655   08/05/22 1300  ceFEPIme (MAXIPIME) 2 g in sodium chloride 0.9 % 100 mL IVPB        2 g 200 mL/hr over 30 Minutes Intravenous  Once 08/05/22 1256 08/05/22 1527        I have personally reviewed the following labs and images: CBC: Recent Labs  Lab 08/03/22 0047 08/03/22 0339 08/05/22 1130 08/05/22 2339 08/06/22 0517 08/07/22 0705 08/07/22 1250 08/07/22 2113 08/08/22 0540 08/08/22 1802  08/09/22 0730  WBC 6.0   < > 7.0 6.2  --  5.7  --   --  5.4  --  4.8  NEUTROABS 3.0  --   --   --   --   --   --   --   --   --   --   HGB 9.2*   < > 10.8* 11.5*   < > 8.0* 8.8* 7.9* 7.7* 10.2* 9.3*  HCT 29.6*   < > 32.9* 33.9*   < > 24.0* 27.2* 23.7* 23.6* 30.2* 28.2*  MCV 92.8   < > 87.0 84.5  --  87.3  --   --  88.1  --  87.6  PLT 223   < > 270 274  --  198  --   --  197  --  198   < > = values in this interval not displayed.   BMP &GFR Recent Labs  Lab 08/05/22 1430 08/05/22 2339 08/06/22 0940 08/07/22 0708 08/08/22 0540 08/09/22 0730  NA  --  138 138 137 138 137  K  --  4.2 4.3 3.9 4.1 3.8  CL  --  105 107 106 107 106  CO2  --  19* '23 24 25 23  '$ GLUCOSE  --  127* 118* 86 99 93  BUN  --  '15 16 22 17 13  '$ CREATININE  --  1.13* 1.15* 1.38* 1.08* 1.06*  CALCIUM  --  8.7* 8.5* 8.0* 8.0* 8.3*  MG 1.4* 1.7  --  1.5* 1.5* 1.7  PHOS  --   --   --   --  3.3 3.2   Estimated Creatinine Clearance: 67.6 mL/min (A) (by C-G formula based on SCr of 1.06 mg/dL (H)). Liver & Pancreas: Recent Labs  Lab 08/03/22 0047 08/05/22 1130 08/08/22 0540 08/09/22 0730  AST 21 26  --   --   ALT 12 13  --   --   ALKPHOS 57 75  --   --   BILITOT 0.6 1.1  --   --   PROT 5.9* 6.5  --   --   ALBUMIN 3.3* 3.4* 2.9* 3.1*   No results for input(s): "LIPASE", "AMYLASE" in the last 168 hours. No results for input(s): "AMMONIA" in the last 168 hours. Diabetic: No results for input(s): "HGBA1C" in the last 72 hours. Recent Labs  Lab 08/07/22 1635 08/07/22 2245 08/08/22 0635 08/08/22 1059 08/08/22 1629  GLUCAP 88 92 104* 105* 91   Cardiac Enzymes: Recent Labs  Lab 08/07/22 0708  CKTOTAL 29*   No results for input(s): "PROBNP" in the last 8760 hours. Coagulation Profile: Recent Labs  Lab 08/05/22  1812 08/06/22 0940 08/07/22 0708 08/08/22 0540 08/09/22 0222  INR 1.4* 1.7* 1.7* 1.6* 1.5*   Thyroid Function Tests: No results for input(s): "TSH", "T4TOTAL", "FREET4", "T3FREE",  "THYROIDAB" in the last 72 hours. Lipid Profile: No results for input(s): "CHOL", "HDL", "LDLCALC", "TRIG", "CHOLHDL", "LDLDIRECT" in the last 72 hours. Anemia Panel: Recent Labs    08/07/22 0705 08/07/22 0708  VITAMINB12  --  879  FOLATE 11.0  --   FERRITIN  --  54  TIBC  --  266  IRON  --  28  RETICCTPCT 3.5*  --    Urine analysis:    Component Value Date/Time   COLORURINE YELLOW 07/23/2022 0109   APPEARANCEUR HAZY (A) 07/23/2022 0109   LABSPEC 1.010 07/23/2022 0109   PHURINE 5.0 07/23/2022 0109   GLUCOSEU NEGATIVE 07/23/2022 0109   GLUCOSEU NEG mg/dL 01/09/2009 2129   HGBUR NEGATIVE 07/23/2022 0109   HGBUR negative 02/27/2007 1327   BILIRUBINUR NEGATIVE 07/23/2022 0109   KETONESUR NEGATIVE 07/23/2022 0109   PROTEINUR NEGATIVE 07/23/2022 0109   UROBILINOGEN 1.0 06/10/2010 1729   NITRITE NEGATIVE 07/23/2022 0109   LEUKOCYTESUR NEGATIVE 07/23/2022 0109   Sepsis Labs: Invalid input(s): "PROCALCITONIN", "LACTICIDVEN"  Microbiology: Recent Results (from the past 240 hour(s))  SARS Coronavirus 2 by RT PCR (hospital order, performed in [facility] hospital lab) *cepheid single result test* Anterior Nasal Swab     Status: None   Collection Time: 08/05/22  1:08 PM   Specimen: Anterior Nasal Swab  Result Value Ref Range Status   SARS Coronavirus 2 by RT PCR NEGATIVE NEGATIVE Final    Comment: (NOTE) SARS-CoV-2 target nucleic acids are NOT DETECTED.  The SARS-CoV-2 RNA is generally detectable in upper and lower respiratory specimens during the acute phase of infection. The lowest concentration of SARS-CoV-2 viral copies this assay can detect is 250 copies / mL. A negative result does not preclude SARS-CoV-2 infection and should not be used as the sole basis for treatment or other patient management decisions.  A negative result may occur with improper specimen collection / handling, submission of specimen other than nasopharyngeal swab, presence of viral mutation(s) within  the areas targeted by this assay, and inadequate number of viral copies (<250 copies / mL). A negative result must be combined with clinical observations, patient history, and epidemiological information.  Fact Sheet for Patients:   https://www.patel.info/  Fact Sheet for Healthcare Providers: https://hall.com/  This test is not yet approved or  cleared by the [place] FDA and has been authorized for detection and/or diagnosis of SARS-CoV-2 by FDA under an Emergency Use Authorization (EUA).  This EUA will remain in effect (meaning this test can be used) for the duration of the COVID-19 declaration under Section 564(b)(1) of the Act, 21 U.S.C. section 360bbb-3(b)(1), unless the authorization is terminated or revoked sooner.  Performed at [facility], [place] [address], [place], [place] [address]   MRSA Next Gen by PCR, Nasal     Status: None   Collection Time: 08/05/22  1:30 PM   Specimen: Nasal Mucosa; Nasal Swab  Result Value Ref Range Status   MRSA by PCR Next Gen NOT DETECTED NOT DETECTED Final    Comment: (NOTE) The GeneXpert MRSA Assay (FDA approved for NASAL specimens only), is one component of a comprehensive MRSA colonization surveillance program. It is not intended to diagnose MRSA infection nor to guide or monitor treatment for MRSA infections. Test performance is not FDA approved in patients less than 42 years old.  Performed at [facility], [place] [address], [place], [place] [address]     Radiology Studies: No results found.    Kolby Schara T. Travilah  If 7PM-7AM, please contact night-coverage [email or web address] 08/09/2022, 10:01 AM

## 2022-08-09 NOTE — Progress Notes (Signed)
[facility] ROUNDING NOTE   Subjective: Feels well, no complaints.  BM this morning, brown, no blood.  Hgb stable after 1 unit pRBC.   Objective: Vital signs in last 24 hours: Temp:  [97.7 F (36.5 C)-99.1 F (37.3 C)] 99.1 F (37.3 C) (09/04 1101) Pulse Rate:  [68-99] 83 (09/04 1101) Resp:  [13-20] 18 (09/04 1101) BP: (119-138)/(71-105) 129/91 (09/04 1101) SpO2:  [94 %-100 %] 94 % (09/04 1101) FiO2 (%):  [32 %] 32 % (09/04 0805) Weight:  [88.3 kg] 88.3 kg (09/04 0415) Last BM Date : 08/08/22 General: NAD, pleasant Af Am fm   Intake/Output from previous day: 09/03 0701 - 09/04 0700 In: 941 [P.O.:358; I.V.:33; Blood:550] Out: 1600 [BWLSL:3734] Intake/Output this shift: Total I/O In: 540 [P.O.:540] Out: 450 [Urine:450]   Lab Results: Recent Labs    08/07/22 0705 08/07/22 1250 08/08/22 0540 08/08/22 1802 08/09/22 0730  WBC 5.7  --  5.4  --  4.8  HGB 8.0*   < > 7.7* 10.2* 9.3*  PLT 198  --  197  --  198  MCV 87.3  --  88.1  --  87.6   < > = values in this interval not displayed.   BMET Recent Labs    08/07/22 0708 08/08/22 0540 08/09/22 0730  NA 137 138 137  K 3.9 4.1 3.8  CL 106 107 106  CO2 '24 25 23  '$ GLUCOSE 86 99 93  BUN '22 17 13  '$ CREATININE 1.38* 1.08* 1.06*  CALCIUM 8.0* 8.0* 8.3*   LFT Recent Labs    08/08/22 0540 08/09/22 0730  ALBUMIN 2.9* 3.1*   PT/INR Recent Labs    08/08/22 0540 08/09/22 0222  INR 1.6* 1.5*      Imaging/Other results: No results found.    Assessment and Plan: 60 year old female with history of atrial fibrillation, status post mitral valve and tricuspid valve repair, COPD and recent history of GI bleed, likely attributable to colonic AVMs, but also found to have a large cecal polyp which was removed on August 25, who experienced brisk hematochezia with a two point drop in in hgb on Aug 31.  Her bleeding resolved with cessation of anticoagulation and her hgb has been stable. I suspect she had a  post polypectomy bleed which resolved spontaneously.  Agree with resuming anticoagulation at this point Patient had a large tubular adenoma with high-grade dysplasia removed in the cecum by Dr. Fuller Plan.  Defer to him for recommendations for surveillance colonoscopy.  GI bleed, likely post polypectomy, resolved - Resume anticoagulation - Okay to go home tomorrow from GI standpoint  Large tubular adenoma with high-grade dysplasia - Defer to Dr. Fuller Plan for polyp surveillance recommendations, but should not exceed 3 years.  Risk/benefits of elective colonoscopy will need to be weighed at that time given patient's significant cardiopulmonary comorbidities  GI will sign off at this time   Daryel November, MD  08/09/2022, 3:33 PM [facility]

## 2022-08-09 NOTE — Progress Notes (Signed)
ANTICOAGULATION CONSULT NOTE   Pharmacy Consult for Warfarin  Indication: atrial fibrillation   Allergies  Allergen Reactions   Norco [Hydrocodone-Acetaminophen] Itching and Other (See Comments)    Tolerates Oxycodone    Patient Measurements: Height: '5\' 11"'$  (180.3 cm) Weight: 88.3 kg (194 lb 11.2 oz) IBW/kg (Calculated) : 70.8 Vital Signs: Temp: 98.6 F (37 C) (09/04 0747) Temp Source: Oral (09/04 0747) BP: 123/98 (09/04 0747) Pulse Rate: 88 (09/04 0747)  Labs: Recent Labs    08/06/22 2247 08/07/22 0705 08/07/22 0708 08/07/22 1250 08/08/22 0540 08/08/22 1802 08/09/22 0222 08/09/22 0730  HGB  --  8.0*  --    < > 7.7* 10.2*  --  9.3*  HCT  --  24.0*  --    < > 23.6* 30.2*  --  28.2*  PLT  --  198  --   --  197  --   --  198  LABPROT  --   --  19.4*  --  18.8*  --  17.6*  --   INR  --   --  1.7*  --  1.6*  --  1.5*  --   HEPARINUNFRC 0.18*  --   --   --   --   --   --   --   CREATININE  --   --  1.38*  --  1.08*  --   --  1.06*  CKTOTAL  --   --  29*  --   --   --   --   --    < > = values in this interval not displayed.     Estimated Creatinine Clearance: 67.6 mL/min (A) (by C-G formula based on SCr of 1.06 mg/dL (H)).   Medical History: Past Medical History:  Diagnosis Date   Acute pericardial effusion 05/17/2022   AKI (acute kidney injury) ([place]) 05/17/2022   Anxiety    Arthritis    Cervicalgia    CHF (congestive heart failure) (H[facility])    Chondromalacia of patella    Chronic neck pain    Chronic pain syndrome    Depression    secondary to loss of her son at age 15   Diabetic neuropathy ([place])    DMII (diabetes mellitus, type 2) ([place])    Dysrhythmia    Enthesopathy of hip region    Fibromyalgia    GERD (gastroesophageal reflux disease)    Hep C w/o coma, chronic ([place])    Hepatitis C    diagnosed 2005   Hypertension    Insomnia    Low back pain    Lumbago    Mitral regurgitation    Obesity    Pain in joint, upper arm    Primary localized  osteoarthrosis, lower leg    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 On warfarin prior to admission for atrial fibrillation. This admission pt was on warfarin through 9/1 but was switched to heparin in setting of hematochezia (colonoscopy on 8/25 with polyps and internal hemorrhoids), believed to be due to polypectomy. INR today 1.5 subtherapeutic off of anticoagulation. Hgb 9.3 and PLT 198K stable. Bleeding noted to be stopped today and per GI okay to resume warfarin.  PTA warfarin dose is '5mg'$  daily except 7.5 mg every Sunday.   Goal:  INR 2-3 Monitor platelets by anticoagulation protocol: Yes   Plan:  Warfarin 5 mg x1 (lower dose than PTA given recent GIB) Daily INR  Monitor closely for any GI bleeding  Eliseo Gum, PharmD PGY1 Pharmacy Resident  08/09/2022  10:32 AM

## 2022-08-09 NOTE — Progress Notes (Signed)
PT Cancellation Note  Patient Details Name: [name] MRN: [id number] DOB: 1961-07-22   Cancelled Treatment:    Reason Eval/Treat Not Completed: Other (comment)  Patient politely refused stating she just laid down in bed after up in chair all day. Reports she walked in room a few times today with nursing supervision. Unable to persuade her to ambulate in hall at this time.   [place]  Office [phone]  Rexanne Mano 08/09/2022, 3:58 PM

## 2022-08-09 NOTE — Progress Notes (Addendum)
Progress Note  Patient Name: [name] Date of Encounter: 08/09/2022  Primary Cardiologist: Werner Lean, MD   Subjective   Overnight no further bleeding. She recounts her history of smoking since the age of 61.  She notes that she is ready to quit: her sister went over to her house to remove all her cigarettes.  Inpatient Medications    Scheduled Meds:  ARIPiprazole  5 mg Oral Daily   atorvastatin  20 mg Oral Daily   buPROPion  300 mg Oral q AM   escitalopram  10 mg Oral q morning   estrogens (conjugated)  0.45 mg Oral Daily   furosemide  20 mg Oral Daily   gabapentin  300 mg Oral TID   hydrocortisone   Rectal BID   ipratropium-albuterol  3 mL Nebulization BID   metoprolol tartrate  25 mg Oral BID   nicotine  14 mg Transdermal Daily   pantoprazole  40 mg Oral Daily   sodium chloride flush  3 mL Intravenous Q12H   spironolactone  12.5 mg Oral Daily   Continuous Infusions:  sodium chloride     PRN Meds: sodium chloride, acetaminophen **OR** acetaminophen, levalbuterol, metoprolol tartrate, oxyCODONE, sodium chloride flush, traZODone   Vital Signs    Vitals:   08/08/22 2322 08/09/22 0415 08/09/22 0747 08/09/22 0805  BP:  (!) 125/99 (!) 123/98   Pulse: 92 99 88   Resp: '13 20 20   '$ Temp:  97.7 F (36.5 C) 98.6 F (37 C)   TempSrc:  Oral Oral   SpO2: 100% 94% 100% 100%  Weight:  88.3 kg    Height:        Intake/Output Summary (Last 24 hours) at 08/09/2022 0811 Last data filed at 08/09/2022 0749 Gross per 24 hour  Intake 1181 ml  Output 1600 ml  Net -419 ml   Filed Weights   08/07/22 0553 08/08/22 0426 08/09/22 0415  Weight: 90.8 kg 90.2 kg 88.3 kg    Telemetry    SR and AT with PVCs; AF on exam - Personally Reviewed  Physical Exam   Gen: no distress  Neck: No JVD Ears: No Pilar Plate Sign Cardiac: No Rubs or Gallops, Systolic and diastolic murmurs;  +2 radial pulses Respiratory: Clear to auscultation bilaterally, normal effort, normal   respiratory rate GI: Soft, nontender, non-distended  MS: No  edema; moves all extremities Integument: Skin feels warm Neuro:  At time of evaluation, alert and oriented to person/place/time/situation  Psych: Normal affect, patient feels well   Labs    Chemistry Recent Labs  Lab 08/03/22 0047 08/04/22 1116 08/05/22 1130 08/05/22 2339 08/06/22 0940 08/07/22 0708 08/08/22 0540  NA 133*   < > 141   < > 138 137 138  K 4.1   < > 4.3   < > 4.3 3.9 4.1  CL 102   < > 109   < > 107 106 107  CO2 19*   < > 23   < > '23 24 25  '$ GLUCOSE 84   < > 102*   < > 118* 86 99  BUN 26*   < > 11   < > '16 22 17  '$ CREATININE 3.14*   < > 1.08*   < > 1.15* 1.38* 1.08*  CALCIUM 7.3*   < > 8.5*   < > 8.5* 8.0* 8.0*  PROT 5.9*  --  6.5  --   --   --   --   ALBUMIN 3.3*  --  3.4*  --   --   --  2.9*  AST 21  --  26  --   --   --   --   ALT 12  --  13  --   --   --   --   ALKPHOS 57  --  75  --   --   --   --   BILITOT 0.6  --  1.1  --   --   --   --   GFRNONAA 16*   < > 58*   < > 54* 43* 58*  ANIONGAP 12   < > 9   < > '8 7 6   '$ < > = values in this interval not displayed.     Hematology Recent Labs  Lab 08/07/22 0705 08/07/22 1250 08/08/22 0540 08/08/22 1802 08/09/22 0730  WBC 5.7  --  5.4  --  4.8  RBC 2.75*  2.77*  --  2.68*  --  3.22*  HGB 8.0*   < > 7.7* 10.2* 9.3*  HCT 24.0*   < > 23.6* 30.2* 28.2*  MCV 87.3  --  88.1  --  87.6  MCH 29.1  --  28.7  --  28.9  MCHC 33.3  --  32.6  --  33.0  RDW 17.0*  --  16.8*  --  16.2*  PLT 198  --  197  --  198   < > = values in this interval not displayed.    Cardiac EnzymesNo results for input(s): "TROPONINI" in the last 168 hours. No results for input(s): "TROPIPOC" in the last 168 hours.   BNP Recent Labs  Lab 08/05/22 1130 08/07/22 0658  BNP 1,742.3* 526.5*     DDimer No results for input(s): "DDIMER" in the last 168 hours.   Radiology    No results found.  Patient Profile     61 y.o. female with syncope admission, then GI bleeding  admission, then AKI admission, now with SOB admission  Assessment & Plan    Acute hypoxic respiratory failure Syncope COPD HFpEF Rheumatic Heart disease s/p PVR and TVR 2023 - returned to lasix 20 mg PO daily - as outpatient (or inpatient if return syncope) would consider resting and exercise RHC to evaluation exercise induced mitral stenosis/pulmonary HTN (reviewed March RHC with no significant PVR) - BMP pending this AM - she is ready to stop smoking  PAF, AT, Svt, and PVCs - back on metoprolol; I worry if she has exercise induced PH (see above)we will have to consider a device and significant beta blockage - CHASVASC NA due to RHD - resume AC when able - live heart monitor on discharge  QT prolongation - K goal or 4, Mg goal of 2     For questions or updates, please contact [facility] Please consult [email or web address] for contact info under Cardiology/STEMI.      Rudean Haskell, MD [place]  [address] [place], [place] [address] [phone]  8:11 AM

## 2022-08-10 DIAGNOSIS — J9601 Acute respiratory failure with hypoxia: Secondary | ICD-10-CM | POA: Diagnosis not present

## 2022-08-10 DIAGNOSIS — J441 Chronic obstructive pulmonary disease with (acute) exacerbation: Secondary | ICD-10-CM | POA: Diagnosis not present

## 2022-08-10 DIAGNOSIS — Z9889 Other specified postprocedural states: Secondary | ICD-10-CM | POA: Diagnosis not present

## 2022-08-10 DIAGNOSIS — I161 Hypertensive emergency: Secondary | ICD-10-CM | POA: Diagnosis not present

## 2022-08-10 LAB — RENAL FUNCTION PANEL
Albumin: 3.4 g/dL — ABNORMAL LOW (ref 3.5–5.0)
Anion gap: 10 (ref 5–15)
BUN: 10 mg/dL (ref 8–23)
CO2: 27 mmol/L (ref 22–32)
Calcium: 8.8 mg/dL — ABNORMAL LOW (ref 8.9–10.3)
Chloride: 103 mmol/L (ref 98–111)
Creatinine, Ser: 0.99 mg/dL (ref 0.44–1.00)
GFR, Estimated: >60 mL/min (ref >60)
Glucose, Bld: 97 mg/dL (ref 70–99)
Phosphorus: 3.4 mg/dL (ref 2.5–4.6)
Potassium: 3.8 mmol/L (ref 3.5–5.1)
Sodium: 140 mmol/L (ref 135–145)

## 2022-08-10 LAB — CBC
HCT: 29.7 % — ABNORMAL LOW (ref 36.0–46.0)
Hemoglobin: 9.9 g/dL — ABNORMAL LOW (ref 12.0–15.0)
MCH: 28.9 pg (ref 26.0–34.0)
MCHC: 33.3 g/dL (ref 30.0–36.0)
MCV: 86.6 fL (ref 80.0–100.0)
Platelets: 204 10*3/uL (ref 150–400)
RBC: 3.43 MIL/uL — ABNORMAL LOW (ref 3.87–5.11)
RDW: 15.9 % — ABNORMAL HIGH (ref 11.5–15.5)
WBC: 5.1 10*3/uL (ref 4.0–10.5)
nRBC: 0 % (ref 0.0–0.2)

## 2022-08-10 LAB — PROTIME-INR
INR: 1.2 (ref 0.8–1.2)
Prothrombin Time: 15.2 seconds (ref 11.4–15.2)

## 2022-08-10 LAB — BRAIN NATRIURETIC PEPTIDE: B Natriuretic Peptide: 713.2 pg/mL — ABNORMAL HIGH (ref 0.0–100.0)

## 2022-08-10 LAB — MAGNESIUM: Magnesium: 1.8 mg/dL (ref 1.7–2.4)

## 2022-08-10 MED ORDER — FUROSEMIDE 20 MG PO TABS: 20.0000 mg | ORAL_TABLET | Freq: Every day | ORAL | 2 refills | Status: DC

## 2022-08-10 MED ORDER — NICOTINE 14 MG/24HR TD PT24: 14.0000 mg | MEDICATED_PATCH | Freq: Every day | TRANSDERMAL | 0 refills | Status: DC

## 2022-08-10 NOTE — TOC Transition Note (Addendum)
Transition of Care [facility]) - CM/SW Discharge Note   Patient Details  Name: [name] MRN: [id number] Date of Birth: [birth year]  [facility] CM/SW Contact:  Zenon Mayo, RN Phone Number: 08/10/2022, 10:09 AM   Clinical Narrative:    Patient is for dc today, NCM was in progression and tech was taking patient out to her car, Patient is active with [facility], NCM asked MD to put the order in for [facility].  Also she left with an etank and this NCM contacted Zach with [employer] to have them to set up concentrator at patient's home.  NCM explained to patient that she must answer the phone for [employer] to come and set up her oxygen she states she understands.   Final next level of care: [place] Barriers to Discharge: Continued Medical Work up   Patient Goals and CMS Choice Patient states their goals for this hospitalization and ongoing recovery are:: return home CMS Medicare.gov Compare Post Acute Care list provided to:: Patient Choice offered to / list presented to : Patient  Discharge Placement                       Discharge Plan and Services In-house Referral: NA Discharge Planning Services: CM Consult Post Acute Care Choice: Home Health            DME Agency: NA       HH Arranged: RN, Disease Management [place] Agency: [place] Date [facility] Agency Contacted: 08/04/22 Time [place]: 1518 Representative spoke with at [place]: [place] ([place]) Interventions     Readmission Risk Interventions    08/06/2022    2:13 PM  Readmission Risk Prevention Plan  Transportation Screening Complete  Medication Review Press photographer) Complete  PCP or Specialist appointment within 3-5 days of discharge Complete  HRI or [place] Complete  SW Recovery Care/Counseling Consult Complete  [place] Not Applicable

## 2022-08-10 NOTE — Discharge Summary (Signed)
Physician Discharge Summary  [name] TGY:[id number] DOB: 07-17-1961 DOA: 08/05/2022  PCP: [name]  Admit date: 08/05/2022 Discharge date: 08/10/2022 Admitted From: Home Disposition: Home Recommendations for Outpatient Follow-up:  Follow up with PCP in 1 to 2 weeks Cardiology to arrange outpatient follow-up Check INR, CBC, BMP and blood pressure at follow-up Please follow up on the following pending results: None  Home Health: [facility] RN Equipment/Devices: Patient has appropriate DME  Discharge Condition: Stable CODE STATUS: Full code  [place] [name], Nicole Follow up.   Contact information: [place] [address] [phone]         [name] Lean, MD .   Specialty: Cardiology Contact information: [place] [address] [phone]         [name] Follow up.   Why: Deputy will call you to set up apt times Contact information: [place] [address] [name], Nicole [facility] Follow up.   Why: oxygen Contact information: [address] [name] [address] [phone]                 Hospital course 61 year old F with PMH of systolic CHF, paroxysmal A-fib with valvular disease on warfarin, DM-2, mitral valve and tricuspid valve repair, HTN, recent hospitalization on 8/21 for GI bleed on 8/28 for syncope/dehydration returning with an acute dyspnea, orthopnea, cough, chest heaviness and elevated BP, and admitted for acute respiratory failure with hypoxia due to CHF exacerbation and hypertensive emergency.  Since she was taken off diuretics when she was last here for syncope and dehydration on 8/28.     In ED, BP 192/117.  HR 118.  RR 26.  Was on BiPAP and weaned to 4 L.  No documented desaturation.  Hgb 10.8.  Troponin 69.  BNP 1742.  CXR with vascular congestion,  pulmonary interstitial edema and patchy opacities in the right base.  Received IV Lasix, vancomycin, cefepime and started on nitroglycerin.  Cardiology consulted and admitted.   Patient developed hematochezia.  Hgb trended from 10.8 to 7.7.  GI consulted. Hematochezia felt to be post polypectomy from her colonoscopy on 8/25.  Transfused 1 unit with appropriate response.  No further hematochezia after stopping anticoagulation.   Warfarin resumed after discussion with gastroenterology on 9/4.  H&H remained stable.  No further bleeding.  Cleared for discharge by cardiology and gastroenterology for outpatient follow-up.   9/4-warfarin resumed after discussion with GI.  See individual problem list below for more.   Problems addressed during this hospitalization Principal Problem:   Acute respiratory failure with hypoxia ([place]) Active Problems:   Acute on chronic systolic CHF (congestive heart failure) (HCC)   possible COPD with acute exacerbation (HCC)   Elevated troponin   Hypertensive emergency   Paroxysmal atrial fibrillation ([place])   S/P MVR/TVR  04/19/22   Type 2 diabetes mellitus without complication, without long-term current use of insulin (HCC)   Normocytic anemia   Hypercholesterolemia   Chronic pain syndrome   Anxiety and depression   Tobacco use   QT prolongation   Acute respiratory failure with hypoxia-89% on room air and required BiPAP on presentation. Likely due to CHF exacerbation.  Pro-Cal negative.  CTA negative for PE.  Chest pain likely musculoskeletal and resolved.  Respiratory failure resolved. -Manage CHF and anemia as below   Acute on chronic systolic CHF:  Had dyspnea and orthopnea.  Had a JVD, crackles, vascular congestion on CXR and elevated BNP on presentation.  Echo and TEE just done. EF of 50-55%. MVR/TVR with no complications. (07/08/63 echo EF of 45-50%).  Diuresed with IV Lasix and transitioned to home p.o. Lasix. -Discharged on home medications. -Cardiology to  arrange outpatient follow-up   Paroxysmal A-fib with mild RVR.  CHADS2 score at least 4.  Has valvular heart disease.  INR 1.2. -Warfarin resumed after discussion with GI. -Continue home metoprolol. -Follow-up in A-fib clinic for INR   Acute blood loss anemia/lower GI bleed: Felt to be from polypectomy.  Bleeding stopped after discontinuing AC.  Transfused 1 unit with appropriate response. Recent Labs    08/06/22 0517 08/06/22 0940 08/06/22 2118 08/07/22 0705 08/07/22 1250 08/07/22 2113 08/08/22 0540 08/08/22 1802 08/09/22 0730 08/10/22 0231  HGB 10.2* 10.1* 8.5* 8.0* 8.8* 7.9* 7.7* 10.2* 9.3* 9.9*  -Recheck CBC at follow-up.  Chronic COPD:  Pneumonia ruled out.   -Continue nebulizers -May need outpatient follow-up with pulmonology/PFT   Hypertensive emergency: Normotensive. -Continue home medications.   Elevated troponin: Suspect demand ischemia in the setting of CHF, hypertensive crisis and tachycardia versus non-STEMI.  Has chest pain per reproducible -Per cardiology.   S/P MVR/TVR on 04/19/22.  Due to RHD.  Recent TEE unremarkable.   Controlled NIDDM-2 with hyperglycemia and HLD: Recent A1c 6.0%.  Euglycemic.   Chronic neck, upper and lower back pain: unchanged from baseline.  On oxycodone 15 mg every 6 hours as needed at home.  Narcotic database reviewed. -Continue home meds.  Discussed adverse effects   Anxiety and depression -Continue wellbutrin and lexapro    Tobacco use -Nicotine patch -Encouraged cessation    Hypomagnesemia: Resolved. -Monitor replenish as appropriate   QT prolongation: QTc 512 and improved to 470. -Avoid or minimize QT prolonging drugs           Vital signs Vitals:   08/09/22 1623 08/09/22 2054 08/10/22 0130 08/10/22 0626  BP: (!) 148/104 121/85  126/79  Pulse: 85 83 85   Temp: 98.3 F (36.8 C) 98.6 F (37 C)  98.5 F (36.9 C)  Resp: _0 Height:      Weight:      SpO2: 98% 97%  99%  TempSrc: Oral Oral  Oral   BMI (Calculated):         Discharge exam  GENERAL: No apparent distress.  Nontoxic. HEENT: MMM.  Vision and hearing grossly intact.  NECK: Supple.  No apparent JVD.  RESP:  No IWOB.  Fair aeration bilaterally. CVS:  RRR. Heart sounds normal.  ABD/GI/GU: BS+. Abd soft, NTND.  MSK/EXT:  Moves extremities. No apparent deformity. No edema.  SKIN: no apparent skin lesion or wound NEURO: Awake and alert. Oriented appropriately.  No apparent focal neuro deficit. PSYCH: Calm. Normal affect.   Discharge Instructions Discharge Instructions     Call MD for:  difficulty breathing, headache or visual disturbances   Complete by: As directed    Call MD for:  extreme fatigue   Complete by: As directed    Call MD for:  persistant dizziness or light-headedness   Complete by: As directed    Diet - low sodium heart healthy   Complete by: As directed    Discharge instructions   Complete by: As directed    It has been a pleasure taking care of you!  You were hospitalized due to difficulty breathing and low oxygen level in the setting  of heart failure exacerbation and markedly elevated blood pressure likely from not restarting your fluid medication after your last hospitalization.  Your symptoms improved to the [name] we think it is safe to let you go home and follow-up with your primary care doctor and cardiologist.  You also had some gastrointestinal bleeding likely from the polypectomy your had on 8/25.  Your bleeding stopped after stopping blood thinner.  You have not had further bleeding after restarting your blood thinner.  Continue taking your medications as prescribed.  Follow-up with your primary care doctor 1 to 2 weeks or sooner if needed.  Follow-up with your cardiologist per their recommendation.  It is important that you quit smoking cigarettes.  You may use nicotine patch to help you quit smoking.  Nicotine patch is available over-the-counter.  You may also discuss other options to help  you quit smoking with your primary care doctor. You can also talk to professional counselors at 1-800-QUIT-NOW [phone]) for free smoking cessation counseling.  Be aware that oxycodone and gabapentin are very addictive and can cause problems including drowsiness, sedation, constipation, impaired judgment, respiratory depression that could potentially lead to death and impaired balance that could increase risk of fall.  We strongly recommend cutting down on these medications or talking to your doctors who prescribe them.  We do not recommend driving, operating machinery or other activity that requires similar mental and physical engagement.     Take care,   Increase activity slowly   Complete by: As directed       Allergies as of 08/10/2022       Reactions   Norco [hydrocodone-acetaminophen] Itching, Other (See Comments)   Tolerates Oxycodone        Medication List     TAKE these medications    acetaminophen 500 MG tablet Commonly known as: TYLENOL Take 1,000 mg by mouth every 6 (six) hours as needed for mild pain.   albuterol 108 (90 Base) MCG/ACT inhaler Commonly known as: VENTOLIN HFA Inhale 2 puffs into the lungs every 6 (six) hours as needed for wheezing or shortness of breath.   ARIPiprazole 5 MG tablet Commonly known as: ABILIFY Take 5 mg by mouth daily.   atorvastatin 20 MG tablet Commonly known as: LIPITOR Take 20 mg by mouth daily.   buPROPion 300 MG 24 hr tablet Commonly known as: WELLBUTRIN XL Take 300 mg by mouth in the morning.   dexlansoprazole 60 MG capsule Commonly known as: DEXILANT Take 1 capsule by mouth daily.   escitalopram 10 MG tablet Commonly known as: LEXAPRO Take 10 mg by mouth every morning.   furosemide 20 MG tablet Commonly known as: LASIX Take 1 tablet (20 mg total) by mouth daily. What changed: additional instructions   gabapentin 600 MG tablet Commonly known as: NEURONTIN Take 0.5 tablets (300 mg total) by mouth 3  (three) times daily.   linagliptin 5 MG Tabs tablet Commonly known as: TRADJENTA Take 1 tablet (5 mg total) by mouth in the morning. Hold until you see your physician   losartan 100 MG tablet Commonly known as: COZAAR Take 1 tablet (100 mg total) by mouth daily. Hold until you see your physician   metoprolol tartrate 50 MG tablet Commonly known as: LOPRESSOR Take 0.5 tablets (25 mg total) by mouth 2 (two) times daily.   naloxone 4 MG/0.1ML Liqd nasal spray kit Commonly known as: NARCAN Place 1 spray into the nose as needed (accidental overdose).   nicotine 14 mg/24hr patch Commonly known as:  NICODERM CQ - dosed in mg/24 hours Place 1 patch (14 mg total) onto the skin daily.   oxyCODONE 15 MG immediate release tablet Commonly known as: ROXICODONE Take 0.5 tablets (7.5 mg total) by mouth every 12 (twelve) hours as needed for pain.   Premarin 0.45 MG tablet Generic drug: estrogens (conjugated) Take 0.45 mg by mouth daily.   spironolactone 25 MG tablet Commonly known as: ALDACTONE Take 12.5 mg by mouth daily.   traZODone 100 MG tablet Commonly known as: DESYREL Take 1 tablet (100 mg total) by mouth at bedtime as needed for sleep.   warfarin 5 MG tablet Commonly known as: COUMADIN Take as directed. If you are unsure how to take this medication, talk to your nurse or doctor. Original instructions: TAKE 1 TABLET BY MOUTH DAILY OR AS DIRECTED BY ANTICOAGULATION CLINIC. What changed: See the new instructions.        Consultations: Cardiology Gastroenterology  Procedures/Studies:   DG Chest 2 View  Result Date: 08/06/2022 CLINICAL DATA:  Short of breath. EXAM: CHEST - 2 VIEW COMPARISON:  08/05/2022 FINDINGS: Previous median sternotomy and cardiac valve prosthesis. Heart size appears normal. Small bilateral pleural effusions. Atelectasis noted in the right lung base. No interstitial edema or airspace opacities. No signs of pneumothorax. IMPRESSION: 1. Small bilateral  pleural effusions with right base atelectasis. Electronically Signed   By: Kerby Moors M.D.   On: 08/06/2022 06:38   DG Chest Port 1 View  Result Date: 08/05/2022 CLINICAL DATA:  Dyspnea EXAM: PORTABLE CHEST 1 VIEW COMPARISON:  Chest radiograph 08/03/2022 FINDINGS: Median sternotomy wires and cardiac valve prostheses are unchanged. The cardiac silhouette is unchanged There is vascular congestion with possible mild pulmonary interstitial edema. There are patchy opacities in the right base which could reflect superimposed developing infection. There is no significant pleural effusion. There is no pneumothorax The bones are stable.  The upper mediastinal contours are normal. IMPRESSION: 1. Vascular congestion with possible mild pulmonary interstitial edema. 2. Patchy opacities in the right base could reflect developing infection in the correct clinical setting. Upright PA and lateral radiographs with good inspiratory effort could be considered for better evaluation. Electronically Signed   By: Valetta Mole M.D.   On: 08/05/2022 12:24   EEG adult  Result Date: 08/04/2022 Lora Havens, MD     08/04/2022 10:34 AM Patient Name: [name] MRN: [id number] Epilepsy Attending: Lora Havens Referring Physician/Provider: Charlynne Cousins Date: 08/04/2022 Duration: 24.37 mins Patient history: 61 year old female with syncope.  EEG to evaluate for seizure. Level of alertness: Awake AEDs during EEG study: None Technical aspects: This EEG study was done with scalp electrodes positioned according to the 10-20 International system of electrode placement. Electrical activity was reviewed with band pass filter of 1-_0 , sensitivity of 7 uV/mm, display speed of 40m/sec with a _1  notched filter applied as appropriate. EEG data were recorded continuously and digitally stored.  Video monitoring was available and reviewed as appropriate. Description: The posterior dominant rhythm consists of 8-9 Hz activity of  moderate voltage (25-35 uV) seen predominantly in posterior head regions, symmetric and reactive to eye opening and eye closing. Physiologic photic driving was not seen during photic stimulation.  Hyperventilation was not performed.   IMPRESSION: This study is within normal limits. No seizures or epileptiform discharges were seen throughout the recording. A normal interictal EEG does not exclude nor support the diagnosis of epilepsy. Priyanka OBarbra Sarks  CT HEAD WO CONTRAST (5MM)  Result Date: 08/03/2022 CLINICAL DATA:  Hypotensive.  Loss of consciousness. EXAM: CT HEAD WITHOUT CONTRAST TECHNIQUE: Contiguous axial images were obtained from the base of the skull through the vertex without intravenous contrast. RADIATION DOSE REDUCTION: This exam was performed according to the departmental dose-optimization program which includes automated exposure control, adjustment of the mA and/or kV according to patient size and/or use of iterative reconstruction technique. COMPARISON:  07/26/2022 FINDINGS: Brain: No evidence of acute infarction, hemorrhage, hydrocephalus, extra-axial collection or mass lesion/mass effect. Vascular: No hyperdense vessel or unexpected calcification. Skull: Normal. Negative for fracture or focal lesion. Sinuses/Orbits: No acute finding. Other: None. IMPRESSION: 1. No acute intracranial abnormalities. Electronically Signed   By: Kerby Moors M.D.   On: 08/03/2022 07:33   DG CHEST PORT 1 VIEW  Result Date: 08/03/2022 CLINICAL DATA:  Shortness of breath EXAM: PORTABLE CHEST 1 VIEW COMPARISON:  Eight days ago FINDINGS: Interstitial opacity with some Kerley lines. No visible effusion. Cardiopericardial enlargement. Prior median sternotomy for AV valve repairs. IMPRESSION: CHF pattern. Electronically Signed   By: Jorje Guild M.D.   On: 08/03/2022 06:40   DG Knee 1-2 Views Left  Result Date: 08/03/2022 CLINICAL DATA:  Syncope and fall EXAM: LEFT KNEE - 2 VIEW COMPARISON:  05/28/2022  FINDINGS: No evidence of fracture, dislocation, or joint effusion. Joint space narrowing with mild spurring at the medial compartment. IMPRESSION: No acute finding. 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 Electronically Signed   By: Jorje Guild M.D.   On: 08/03/2022 04:16   DG Knee 1-2 Views Right  Result Date: 08/03/2022 CLINICAL DATA:  Syncope and fall EXAM: RIGHT KNEE - 2 VIEW COMPARISON:  None Available. FINDINGS: No evidence of fracture, dislocation, or joint effusion. Minimal marginal spurring at the medial compartment. IMPRESSION: No acute finding. Electronically Signed   By: Jorje Guild M.D.   On: 08/03/2022 04:15   ECHO TEE  Result Date: 07/28/2022    TRANSESOPHOGEAL ECHO REPORT   Patient Name:   [name] Date of Exam: 07/28/2022 Medical Rec #:  [id number]         Height:       71.0 in Accession #:    [id number]        Weight:       205.0 lb Date of Birth:  [birth year]         BSA:          2.131 m Patient Age:    16 years          BP:           114/72 mmHg Patient Gender: F                 HR:           83 bpm. Exam Location:  Inpatient Procedure: Transesophageal Echo, 3D Echo, Color Doppler and Cardiac Doppler Indications:     elevated mitral valve gradients  History:         Patient has prior history of Echocardiogram examinations, most                  recent 07/27/2022. Risk Factors:Diabetes, Hypertension, GERD and                  Current Smoker.                   Mitral Valve: prosthetic annuloplasty ring valve is present in                  the mitral  position. Procedure Date: 04/19/22.  Sonographer:     Bernadene Person RDCS Referring Phys:  [id number] [facility] A Gasper Sells Diagnosing Phys: Sanda Klein MD PROCEDURE: After discussion of the risks and benefits of a TEE, an informed consent was obtained from the patient. The transesophogeal probe was passed without difficulty through the esophogus of the patient. Sedation performed by different physician. The patient was monitored  while under deep sedation. Anesthestetic sedation was provided intravenously by Anesthesiology: 137.52m of Propofol, 818mof Lidocaine. The patient's vital signs; including heart rate, blood pressure, and oxygen saturation; remained stable throughout the procedure. The patient developed no complications during the procedure. IMPRESSIONS  1. Left ventricular ejection fraction, by estimation, is 60 to 65%. The left ventricle has normal function. The left ventricle has no regional wall motion abnormalities. There is mild concentric left ventricular hypertrophy. Left ventricular diastolic function could not be evaluated.  2. Right ventricular systolic function is normal. The right ventricular size is normal. There is moderately elevated pulmonary artery systolic pressure. The estimated right ventricular systolic pressure is 5422.2mHg.  3. Left atrial size was severely dilated. No left atrial/left atrial appendage thrombus was detected. The LAA emptying velocity was 70 cm/s.  4. Right atrial size was mildly dilated.  5. The mitral valve has been repaired/replaced. Mild mitral valve regurgitation. Mild mitral stenosis. The mean mitral valve gradient is 6.6 mmHg with average heart rate of 83 bpm. 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 Procedure Date: 04/19/22.  6. The tricuspid valve is has been repaired/replaced. 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 Tricuspid valve regurgitation is moderate.  7. The aortic valve is tricuspid. Aortic valve regurgitation is not visualized. No aortic stenosis is present. Comparison(s): A "double envelope" or "ghosting" artifact is seen during CW interrogation of the mitral inflow. This may have led to overestimation of the transmitral gradient on the previous study. FINDINGS  Left Ventricle: Left ventricular ejection fraction, by estimation, is 60 to 65%. The left ventricle has normal function. The left ventricle has no  regional wall motion abnormalities. The left ventricular internal cavity size was normal in size. There is  mild concentric left ventricular hypertrophy. Left ventricular diastolic function could not be evaluated due to mitral valve repair. Left ventricular diastolic function could not be evaluated. Right Ventricle: The right ventricular size is normal. No increase in right ventricular wall thickness. Right ventricular systolic function is normal. There is moderately elevated pulmonary artery systolic pressure. The tricuspid regurgitant velocity is 3.52 m/s, and with an assumed right atrial pressure of 5 mmHg, the estimated right ventricular systolic pressure is 5497.9mHg. Left Atrium: Left atrial size was severely dilated. No left atrial/left atrial appendage thrombus was detected. The LAA emptying velocity was 70 cm/s. Right Atrium: Right atrial size was mildly dilated. Pericardium: There is no evidence of pericardial effusion. Mitral Valve: The mitral valve has been repaired/replaced. Mild mitral valve regurgitation, with centrally-directed jet. 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 Procedure Date: 04/19/22. Mild mitral valve stenosis. The mean  mitral valve gradient is 6.6 mmHg with average heart rate of 83 bpm. Tricuspid Valve: The tricuspid valve is has been repaired/replaced. Tricuspid valve regurgitation is moderate. 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 Aortic Valve: The aortic valve is tricuspid. Aortic valve regurgitation is not visualized. No aortic stenosis is present. Pulmonic Valve: The pulmonic valve was normal in structure. Pulmonic valve regurgitation is not  visualized. Aorta: The aortic root, ascending aorta, aortic arch and descending aorta are all structurally normal, with no evidence of dilitation or obstruction. There is minimal (Grade I) plaque. IAS/Shunts: No atrial level shunt detected by color flow Doppler.  MITRAL VALVE              TRICUSPID VALVE MV Area (PHT): 2.48 cm  TV Area (PHT):  2.26 cm MV Mean grad:  6.6 mmHg  TV Mean grad:   1.3 mmHg MV Decel Time: 306 msec  TV PHT:         97 msec                          TR Peak grad:   49.6 mmHg                          TR Vmax:        352.00 cm/s Dani Gobble Croitoru MD Electronically signed by Sanda Klein MD Signature Date/Time: 07/28/2022/2:32:07 PM    Final    ECHOCARDIOGRAM LIMITED  Result Date: 07/27/2022    ECHOCARDIOGRAM LIMITED REPORT   Patient Name:   [name] Date of Exam: 07/27/2022 Medical Rec #:  [id number]         Height:       71.0 in Accession #:    [id number]        Weight:       210.2 lb Date of Birth:  1961/10/09         BSA:          2.154 m Patient Age:    50 years          BP:           155/82 mmHg Patient Gender: F                 HR:           87 bpm. Exam Location:  Inpatient Procedure: Limited Echo, Limited Color Doppler and Cardiac Doppler Indications:    mitral valve disorder  History:        Patient has prior history of Echocardiogram examinations, most                 recent 05/31/2022. CHF, Arrythmias:Bradycardia; Risk                 Factors:Dyslipidemia, Diabetes and Current Smoker.                  Mitral Valve: valve is present in the mitral position. Procedure                 Date: 04/19/22.  Sonographer:    Johny Chess RDCS Referring Phys: [id number] [place] A CHANDRASEKHAR IMPRESSIONS  1. Left ventricular ejection fraction, by estimation, is 50 to 55%. The left ventricle has low normal function.  2. Left atrial size was severely dilated.  3. Right atrial size was moderately dilated.  4. S/p MV prosthetic annuloplasty ring placed 04/19/2022. mod-severe mitral stenosis, MV mean gradient 15 mmHg, peak 27 mmhg at HR 87 bpm. Mild mitral valve regurgitation. There is a present in the mitral position. Procedure Date: 04/19/22.  5. 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 Tricuspid valve regurgitation is moderate.  6. Aortic  valve regurgitation is not visualized.  7. There is severely elevated pulmonary artery systolic pressure. Conclusion(s)/Recommendation(s): Compared to prior study, MV mean  gradient has increased. May be related to rate. Consider TEE/cardiac CT if clinically indicated. FINDINGS  Left Ventricle: Left ventricular ejection fraction, by estimation, is 50 to 55%. The left ventricle has low normal function. Right Ventricle: There is severely elevated pulmonary artery systolic pressure. The tricuspid regurgitant velocity is 3.74 m/s, and with an assumed right atrial pressure of 5 mmHg, the estimated right ventricular systolic pressure is 49.7 mmHg. Left Atrium: Left atrial size was severely dilated. Right Atrium: Right atrial size was moderately dilated. Mitral Valve: S/p MV prosthetic annuloplasty ring placed 04/19/2022. mod-severe mitral stenosis, MV mean gradient 15 mmHg, peak 27 mmhg at HR 87 bpm. Mild mitral valve regurgitation. There is a present in the mitral position. Procedure Date: 04/19/22. MV peak gradient, 26.8 mmHg. The mean mitral valve gradient is 15.0 mmHg. Tricuspid Valve: Tricuspid valve regurgitation is moderate. 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 Aortic Valve: Aortic valve regurgitation is not visualized. Pulmonic Valve: Pulmonic valve regurgitation is not visualized. LEFT VENTRICLE PLAX 2D LVOT diam:     2.30 cm LV SV:         88 LV SV Index:   41 LVOT Area:     4.15 cm  IVC IVC diam: 2.20 cm AORTIC VALVE             PULMONIC VALVE LVOT Vmax:   114.50 cm/s RVOT Peak grad: 1 mmHg LVOT Vmean:  75.250 cm/s LVOT VTI:    0.213 m MITRAL VALVE              TRICUSPID VALVE MV Area VTI:  1.53 cm    TV Peak grad:   6.5 mmHg MV Peak grad: 26.8 mmHg   TV Mean grad:   4.0 mmHg MV Mean grad: 15.0 mmHg   TV Vmax:        1.27 m/s MV Vmax:      2.59 m/s    TV Vmean:       93.6 cm/s MV Vmean:     183.0 cm/s  TV VTI:         0.29 msec MR Peak grad: 149.3 mmHg  TR Peak grad:   56.0 mmHg  MR Mean grad: 97.0 mmHg   TR Vmax:        374.00 cm/s MR Vmax:      611.00 cm/s MR Vmean:     469.0 cm/s  SHUNTS                           Systemic VTI:  0.21 m                           Systemic Diam: 2.30 cm                           Pulmonic VTI:  0.107 m Phineas Inches Electronically signed by Phineas Inches Signature Date/Time: 07/27/2022/12:28:20 PM    Final    DG Chest Port 1 View  Result Date: 07/26/2022 CLINICAL DATA:  Syncope EXAM: PORTABLE CHEST 1 VIEW COMPARISON:  07/22/2022 FINDINGS: Transverse diameter of heart is increased. Central pulmonary vessels are more prominent. Increased interstitial markings are seen in the parahilar regions and lower lung fields. There is no focal consolidation. There is no pleural effusion or pneumothorax. 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 IMPRESSION: Central pulmonary vessels are more prominent.  Increased interstitial markings are seen in the parahilar regions and lower lung fields. Findings suggest CHF with interstitial pulmonary edema. Electronically Signed   By: Elmer Picker M.D.   On: 07/26/2022 12:49   CT Head Wo Contrast  Result Date: 07/26/2022 CLINICAL DATA:  Head trauma, coagulopathy, syncopal episode striking head, history MVR, d type II iabetes mellitus, hypertension, smoker EXAM: CT HEAD WITHOUT CONTRAST TECHNIQUE: Contiguous axial images were obtained from the base of the skull through the vertex without intravenous contrast. RADIATION DOSE REDUCTION: This exam was performed according to the departmental dose-optimization program which includes automated exposure control, adjustment of the mA and/or kV according to patient size and/or use of iterative reconstruction technique. COMPARISON:  05/22/2022 FINDINGS: Brain: Normal ventricular morphology. No midline shift or mass effect. Normal appearance of brain parenchyma. No intracranial hemorrhage, mass lesion, evidence of acute infarction, or extra-axial fluid  collection. Few scattered nonspecific dural calcifications unchanged. Vascular: No hyperdense vessels. Skull: Intact Sinuses/Orbits: Clear Other: N/A IMPRESSION: No acute intracranial abnormalities. Electronically Signed   By: Lavonia Dana M.D.   On: 07/26/2022 12:27   DG Chest Port 1 View  Result Date: 07/22/2022 CLINICAL DATA:  Back pain and bradycardia EXAM: PORTABLE CHEST 1 VIEW COMPARISON:  05/28/2022 FINDINGS: Cardiac shadow is within normal limits. Postsurgical changes are again seen. Lungs are well aerated bilaterally. No acute bony abnormality is seen. IMPRESSION: No active disease. Electronically Signed   By: Inez Catalina M.D.   On: 07/22/2022 19:38       The results of significant diagnostics from this hospitalization (including imaging, microbiology, ancillary and laboratory) are listed below for reference.     Microbiology: Recent Results (from the past 240 hour(s))  SARS Coronavirus 2 by RT PCR (hospital order, performed in [facility] hospital lab) *cepheid single result test* Anterior Nasal Swab     Status: None   Collection Time: 08/05/22  1:08 PM   Specimen: Anterior Nasal Swab  Result Value Ref Range Status   SARS Coronavirus 2 by RT PCR NEGATIVE NEGATIVE Final    Comment: (NOTE) SARS-CoV-2 target nucleic acids are NOT DETECTED.  The SARS-CoV-2 RNA is generally detectable in upper and lower respiratory specimens during the acute phase of infection. The lowest concentration of SARS-CoV-2 viral copies this assay can detect is 250 copies / mL. A negative result does not preclude SARS-CoV-2 infection and should not be used as the sole basis for treatment or other patient management decisions.  A negative result may occur with improper specimen collection / handling, submission of specimen other than nasopharyngeal swab, presence of viral mutation(s) within the areas targeted by this assay, and inadequate number of viral copies (<250 copies / mL). A negative result must be  combined with clinical observations, patient history, and epidemiological information.  Fact Sheet for Patients:   https://www.patel.info/  Fact Sheet for Healthcare Providers: https://hall.com/  This test is not yet approved or  cleared by the [place] FDA and has been authorized for detection and/or diagnosis of SARS-CoV-2 by FDA under an Emergency Use Authorization (EUA).  This EUA will remain in effect (meaning this test can be used) for the duration of the COVID-19 declaration under Section 564(b)(1) of the Act, 21 U.S.C. section 360bbb-3(b)(1), unless the authorization is terminated or revoked sooner.  Performed at [facility], [place] [address], [place], [place] [address]   MRSA Next Gen by PCR, Nasal     Status: None   Collection Time: 08/05/22  1:30 PM  Specimen: Nasal Mucosa; Nasal Swab  Result Value Ref Range Status   MRSA by PCR Next Gen NOT DETECTED NOT DETECTED Final    Comment: (NOTE) The GeneXpert MRSA Assay (FDA approved for NASAL specimens only), is one component of a comprehensive MRSA colonization surveillance program. It is not intended to diagnose MRSA infection nor to guide or monitor treatment for MRSA infections. Test performance is not FDA approved in patients less than 74 years old. Performed at [facility], [place] [address] [place], [place] [address]      Labs:  CBC: Recent Labs  Lab 08/05/22 2339 08/06/22 0517 08/07/22 0705 08/07/22 1250 08/07/22 2113 08/08/22 0540 08/08/22 1802 08/09/22 0730 08/10/22 0231  WBC 6.2  --  5.7  --   --  5.4  --  4.8 5.1  HGB 11.5*   < > 8.0*   < > 7.9* 7.7* 10.2* 9.3* 9.9*  HCT 33.9*   < > 24.0*   < > 23.7* 23.6* 30.2* 28.2* 29.7*  MCV 84.5  --  87.3  --   --  88.1  --  87.6 86.6  PLT 274  --  198  --   --  197  --  198 204   < > = values in this interval not displayed.   BMP &GFR Recent Labs  Lab 08/05/22 2339 08/06/22 0940 08/07/22 0708  08/08/22 0540 08/09/22 0730 08/10/22 0231  NA 138 138 137 138 137 140  K 4.2 4.3 3.9 4.1 3.8 3.8  CL 105 107 106 107 106 103  CO2 19* _0 GLUCOSE 127* 118* 86 99 93 97  BUN _1 CREATININE 1.13* 1.15* 1.38* 1.08* 1.06* 0.99  CALCIUM 8.7* 8.5* 8.0* 8.0* 8.3* 8.8*  MG 1.7  --  1.5* 1.5* 1.7 1.8  PHOS  --   --   --  3.3 3.2 3.4   Estimated Creatinine Clearance: 72.4 mL/min (by C-G formula based on SCr of 0.99 mg/dL). Liver & Pancreas: Recent Labs  Lab 08/05/22 1130 08/08/22 0540 08/09/22 0730 08/10/22 0231  AST 26  --   --   --   ALT 13  --   --   --   ALKPHOS 75  --   --   --   BILITOT 1.1  --   --   --   PROT 6.5  --   --   --   ALBUMIN 3.4* 2.9* 3.1* 3.4*   No results for input(s): "LIPASE", "AMYLASE" in the last 168 hours. No results for input(s): "AMMONIA" in the last 168 hours. Diabetic: No results for input(s): "HGBA1C" in the last 72 hours. Recent Labs  Lab 08/07/22 1635 08/07/22 2245 08/08/22 0635 08/08/22 1059 08/08/22 1629  GLUCAP 88 92 104* 105* 91   Cardiac Enzymes: Recent Labs  Lab 08/07/22 0708  CKTOTAL 29*   No results for input(s): "PROBNP" in the last 8760 hours. Coagulation Profile: Recent Labs  Lab 08/06/22 0940 08/07/22 0708 08/08/22 0540 08/09/22 0222 08/10/22 0231  INR 1.7* 1.7* 1.6* 1.5* 1.2   Thyroid Function Tests: No results for input(s): "TSH", "T4TOTAL", "FREET4", "T3FREE", "THYROIDAB" in the last 72 hours. Lipid Profile: No results for input(s): "CHOL", "HDL", "LDLCALC", "TRIG", "CHOLHDL", "LDLDIRECT" in the last 72 hours. Anemia Panel: No results for input(s): "VITAMINB12", "FOLATE", "FERRITIN", "TIBC", "IRON", "RETICCTPCT" in the last 72 hours. Urine analysis:    Component Value Date/Time   COLORURINE YELLOW 07/23/2022 0109  APPEARANCEUR HAZY (A) 07/23/2022 0109   LABSPEC 1.010 07/23/2022 0109   PHURINE 5.0 07/23/2022 0109   GLUCOSEU NEGATIVE 07/23/2022 0109   GLUCOSEU NEG mg/dL  01/09/2009 2129   HGBUR NEGATIVE 07/23/2022 0109   HGBUR negative 02/27/2007 1327   BILIRUBINUR NEGATIVE 07/23/2022 0109   KETONESUR NEGATIVE 07/23/2022 0109   PROTEINUR NEGATIVE 07/23/2022 0109   UROBILINOGEN 1.0 06/10/2010 1729   NITRITE NEGATIVE 07/23/2022 0109   LEUKOCYTESUR NEGATIVE 07/23/2022 0109   Sepsis Labs: Invalid input(s): "PROCALCITONIN", "LACTICIDVEN"   SIGNED:  Mercy Riding, MD  [facility] 08/10/2022, 3:34 PM

## 2022-08-10 NOTE — Plan of Care (Signed)
  Problem: Health Behavior/Discharge Planning: Goal: Ability to manage health-related needs will improve Outcome: Adequate for Discharge   Problem: Clinical Measurements: Goal: Ability to maintain clinical measurements within normal limits will improve Outcome: Adequate for Discharge Goal: Will remain free from infection Outcome: Adequate for Discharge Goal: Diagnostic test results will improve Outcome: Adequate for Discharge Goal: Respiratory complications will improve Outcome: Adequate for Discharge Goal: Cardiovascular complication will be avoided Outcome: Adequate for Discharge   Problem: Activity: Goal: Risk for activity intolerance will decrease Outcome: Adequate for Discharge   Problem: Nutrition: Goal: Adequate nutrition will be maintained Outcome: Adequate for Discharge   Problem: Coping: Goal: Level of anxiety will decrease Outcome: Adequate for Discharge   Problem: Elimination: Goal: Will not experience complications related to bowel motility Outcome: Adequate for Discharge Goal: Will not experience complications related to urinary retention Outcome: Adequate for Discharge   Problem: Pain Managment: Goal: General experience of comfort will improve Outcome: Adequate for Discharge   Problem: Safety: Goal: Ability to remain free from injury will improve Outcome: Adequate for Discharge   Problem: Skin Integrity: Goal: Risk for impaired skin integrity will decrease Outcome: Adequate for Discharge   Problem: Education: Goal: Ability to describe self-care measures that may prevent or decrease complications (Diabetes Survival Skills Education) will improve Outcome: Adequate for Discharge Goal: Individualized Educational Video(s) Outcome: Adequate for Discharge   Problem: Coping: Goal: Ability to adjust to condition or change in health will improve Outcome: Adequate for Discharge   Problem: Fluid Volume: Goal: Ability to maintain a balanced intake and output  will improve Outcome: Adequate for Discharge   Problem: Health Behavior/Discharge Planning: Goal: Ability to identify and utilize available resources and services will improve Outcome: Adequate for Discharge Goal: Ability to manage health-related needs will improve Outcome: Adequate for Discharge   Problem: Metabolic: Goal: Ability to maintain appropriate glucose levels will improve Outcome: Adequate for Discharge   Problem: Nutritional: Goal: Maintenance of adequate nutrition will improve Outcome: Adequate for Discharge Goal: Progress toward achieving an optimal weight will improve Outcome: Adequate for Discharge   Problem: Skin Integrity: Goal: Risk for impaired skin integrity will decrease Outcome: Adequate for Discharge   Problem: Tissue Perfusion: Goal: Adequacy of tissue perfusion will improve Outcome: Adequate for Discharge   Problem: Education: Goal: Ability to demonstrate management of disease process will improve Outcome: Adequate for Discharge Goal: Ability to verbalize understanding of medication therapies will improve Outcome: Adequate for Discharge Goal: Individualized Educational Video(s) Outcome: Adequate for Discharge   Problem: Activity: Goal: Capacity to carry out activities will improve Outcome: Adequate for Discharge   Problem: Cardiac: Goal: Ability to achieve and maintain adequate cardiopulmonary perfusion will improve Outcome: Adequate for Discharge   Problem: Acute Rehab PT Goals(only PT should resolve) Goal: Pt Will Ambulate Outcome: Adequate for Discharge Goal: Pt Will Go Up/Down Stairs Outcome: Adequate for Discharge

## 2022-08-10 NOTE — Progress Notes (Signed)
Rounding Note    Patient Name: [name] Date of Encounter: 08/10/2022  [place] Cardiologist: Werner Lean, MD   Subjective   Did not wish to get weighed this morning.  Currently pleasant.  No significant shortness of breath.  Inpatient Medications    Scheduled Meds:  ARIPiprazole  5 mg Oral Daily   atorvastatin  20 mg Oral Daily   buPROPion  300 mg Oral q AM   escitalopram  10 mg Oral q morning   estrogens (conjugated)  0.45 mg Oral Daily   furosemide  20 mg Oral Daily   gabapentin  300 mg Oral TID   hydrocortisone   Rectal BID   losartan  50 mg Oral Daily   metoprolol tartrate  25 mg Oral BID   nicotine  14 mg Transdermal Daily   pantoprazole  40 mg Oral Daily   sodium chloride flush  3 mL Intravenous Q12H   spironolactone  12.5 mg Oral Daily   Warfarin - Pharmacist Dosing Inpatient   Does not apply q1600   Continuous Infusions:  sodium chloride     PRN Meds: sodium chloride, acetaminophen **OR** acetaminophen, levalbuterol, metoprolol tartrate, oxyCODONE, sodium chloride flush, traZODone   Vital Signs    Vitals:   08/09/22 1623 08/09/22 2054 08/10/22 0130 08/10/22 0626  BP: (!) 148/104 121/85  126/79  Pulse: 85 83 85   Resp: '20 20 18 20  '$ Temp: 98.3 F (36.8 C) 98.6 F (37 C)  98.5 F (36.9 C)  TempSrc: Oral Oral  Oral  SpO2: 98% 97%  99%  Weight:      Height:        Intake/Output Summary (Last 24 hours) at 08/10/2022 0852 Last data filed at 08/10/2022 2094 Gross per 24 hour  Intake 585.08 ml  Output 550 ml  Net 35.08 ml      08/09/2022    4:15 AM 08/08/2022    4:26 AM 08/07/2022    5:53 AM  Last 3 Weights  Weight (lbs) 194 lb 11.2 oz 198 lb 12.8 oz 200 lb 3.2 oz  Weight (kg) 88.315 kg 90.175 kg 90.81 kg      Telemetry    Sinus rhythm with PACs.  No adverse arrhythmias- Personally Reviewed  ECG    No new- Personally Reviewed  Physical Exam   GEN: No acute distress.   Neck: No JVD Cardiac: RRR, no murmurs,  rubs, or gallops.  Occasional ectopy Respiratory: Clear to auscultation bilaterally. GI: Soft, nontender, non-distended  MS: No edema; No deformity. Neuro:  Nonfocal  Psych: Normal affect   Labs    High Sensitivity Troponin:   Recent Labs  Lab 08/03/22 0339 08/05/22 1130 08/05/22 1430 08/05/22 1812 08/05/22 2038  TROPONINIHS 49* 69* 143* 191* 190*     Chemistry Recent Labs  Lab 08/05/22 1130 08/05/22 1430 08/08/22 0540 08/09/22 0730 08/10/22 0231  NA 141   < > 138 137 140  K 4.3   < > 4.1 3.8 3.8  CL 109   < > 107 106 103  CO2 23   < > '25 23 27  '$ GLUCOSE 102*   < > 99 93 97  BUN 11   < > '17 13 10  '$ CREATININE 1.08*   < > 1.08* 1.06* 0.99  CALCIUM 8.5*   < > 8.0* 8.3* 8.8*  MG  --    < > 1.5* 1.7 1.8  PROT 6.5  --   --   --   --  ALBUMIN 3.4*  --  2.9* 3.1* 3.4*  AST 26  --   --   --   --   ALT 13  --   --   --   --   ALKPHOS 75  --   --   --   --   BILITOT 1.1  --   --   --   --   GFRNONAA 58*   < > 58* 59* >60  ANIONGAP 9   < > '6 8 10   '$ < > = values in this interval not displayed.    Lipids No results for input(s): "CHOL", "TRIG", "HDL", "LABVLDL", "LDLCALC", "CHOLHDL" in the last 168 hours.  Hematology Recent Labs  Lab 08/08/22 0540 08/08/22 1802 08/09/22 0730 08/10/22 0231  WBC 5.4  --  4.8 5.1  RBC 2.68*  --  3.22* 3.43*  HGB 7.7* 10.2* 9.3* 9.9*  HCT 23.6* 30.2* 28.2* 29.7*  MCV 88.1  --  87.6 86.6  MCH 28.7  --  28.9 28.9  MCHC 32.6  --  33.0 33.3  RDW 16.8*  --  16.2* 15.9*  PLT 197  --  198 204   Thyroid No results for input(s): "TSH", "FREET4" in the last 168 hours.  BNP Recent Labs  Lab 08/05/22 1130 08/07/22 0658 08/10/22 0231  BNP 1,742.3* 526.5* 713.2*    DDimer No results for input(s): "DDIMER" in the last 168 hours.   Radiology    No results found.  Cardiac Studies   TEE reviewed  Patient Profile     61 y.o. female here with syncope, GI bleed, AKI, now short of breath with rheumatic heart disease, COPD, diastolic heart  failure  Assessment & Plan    Acute hypoxic respiratory failure Syncope COPD Diastolic heart failure acute on chronic Rheumatic heart disease, tricuspid as well as mitral valve repair - Lasix now at 20 mg a day.  Previously felt that syncope was in part from dehydration.  On Aldactone.  Metoprolol. - Could consider rest/exercise right heart catheterization in the future to evaluate exercise-induced mitral stenosis as well as pulmonary hypertension.  Prior right heart catheterization in March 2023 showed no significant pulmonary vascular resistance. Continue to encourage tobacco cessation -Transesophageal echocardiogram on 07/28/2022 shows mean mitral valve gradient of 6 mmHg compatible with prior repair/annuloplasty ring  Paroxysmal atrial fibrillation Atrial tachycardia PVCs -On metoprolol. -Back on warfarin anticoagulation.  Prior GI bleed. On discharge would recommend ZIO AT, active monitoring.  QT prolongation - Keep potassium greater than 4 magnesium greater than 2  GI bleed - Hemoglobin is low as 7.7 status post 1 unit.  GI felt that hematochezia was secondary to polypectomy from colonoscopy on 8/25.    For questions or updates, please contact [facility] Please consult [email or web address] for contact info under        Signed, Candee Furbish, MD  08/10/2022, 8:52 AM

## 2022-08-10 NOTE — Progress Notes (Signed)
Patient given discharge instructions and stated understanding. 

## 2022-08-10 NOTE — Progress Notes (Signed)
Patient refused daily weight.

## 2022-08-10 NOTE — Progress Notes (Addendum)
08/10/22 0900  Mobility  Activity Ambulated with assistance in hallway  Level of Assistance Standby assist, set-up cues, supervision of patient - no hands on  Assistive Device Front wheel walker  Distance Ambulated (ft) 230 ft  Activity Response Tolerated well  $Mobility charge 1 Mobility   Mobility Specialist Progress Note  Pre-Mobility: 97% SpO2  Received pt in bed having no complaints and agreeable to mobility. Pt was asymptomatic throughout ambulation and returned to room w/o fault. Left EOB w/ call bell in reach and all needs met.     Mobility Specialist   

## 2022-08-12 ENCOUNTER — Emergency Department (HOSPITAL_COMMUNITY): Payer: Medicare Other

## 2022-08-12 ENCOUNTER — Inpatient Hospital Stay (HOSPITAL_COMMUNITY): Payer: Medicare Other

## 2022-08-12 ENCOUNTER — Telehealth: Payer: Self-pay | Admitting: Internal Medicine

## 2022-08-12 ENCOUNTER — Ambulatory Visit: Payer: Medicare Other | Attending: Interventional Cardiology | Admitting: Interventional Cardiology

## 2022-08-12 ENCOUNTER — Encounter (HOSPITAL_COMMUNITY): Payer: Self-pay | Admitting: Pulmonary Disease

## 2022-08-12 ENCOUNTER — Inpatient Hospital Stay (HOSPITAL_COMMUNITY)
Admission: EM | Admit: 2022-08-12 | Discharge: 2022-08-17 | DRG: 682 | Disposition: A | Discharge status: Home Health | Payer: Medicare Other | Source: Ambulatory Visit | Attending: Internal Medicine | Admitting: Internal Medicine

## 2022-08-12 ENCOUNTER — Encounter: Payer: Self-pay | Admitting: Interventional Cardiology

## 2022-08-12 ENCOUNTER — Other Ambulatory Visit: Payer: Self-pay

## 2022-08-12 VITALS — BP 82/42 | HR 57 | Ht 71.0 in | Wt 210.0 lb

## 2022-08-12 DIAGNOSIS — Z818 Family history of other mental and behavioral disorders: Secondary | ICD-10-CM

## 2022-08-12 DIAGNOSIS — Z9889 Other specified postprocedural states: Secondary | ICD-10-CM

## 2022-08-12 DIAGNOSIS — Z952 Presence of prosthetic heart valve: Secondary | ICD-10-CM | POA: Diagnosis not present

## 2022-08-12 DIAGNOSIS — M797 Fibromyalgia: Secondary | ICD-10-CM | POA: Diagnosis present

## 2022-08-12 DIAGNOSIS — G894 Chronic pain syndrome: Secondary | ICD-10-CM | POA: Diagnosis present

## 2022-08-12 DIAGNOSIS — E1140 Type 2 diabetes mellitus with diabetic neuropathy, unspecified: Secondary | ICD-10-CM | POA: Diagnosis present

## 2022-08-12 DIAGNOSIS — F419 Anxiety disorder, unspecified: Secondary | ICD-10-CM | POA: Diagnosis present

## 2022-08-12 DIAGNOSIS — Z833 Family history of diabetes mellitus: Secondary | ICD-10-CM

## 2022-08-12 DIAGNOSIS — D649 Anemia, unspecified: Secondary | ICD-10-CM | POA: Diagnosis present

## 2022-08-12 DIAGNOSIS — I5023 Acute on chronic systolic (congestive) heart failure: Secondary | ICD-10-CM | POA: Diagnosis present

## 2022-08-12 DIAGNOSIS — I480 Paroxysmal atrial fibrillation: Secondary | ICD-10-CM | POA: Diagnosis present

## 2022-08-12 DIAGNOSIS — I3139 Other pericardial effusion (noninflammatory): Secondary | ICD-10-CM | POA: Diagnosis present

## 2022-08-12 DIAGNOSIS — I2729 Other secondary pulmonary hypertension: Secondary | ICD-10-CM | POA: Diagnosis present

## 2022-08-12 DIAGNOSIS — R55 Syncope and collapse: Secondary | ICD-10-CM | POA: Diagnosis not present

## 2022-08-12 DIAGNOSIS — I959 Hypotension, unspecified: Secondary | ICD-10-CM | POA: Diagnosis present

## 2022-08-12 DIAGNOSIS — I951 Orthostatic hypotension: Secondary | ICD-10-CM | POA: Diagnosis not present

## 2022-08-12 DIAGNOSIS — Z90710 Acquired absence of both cervix and uterus: Secondary | ICD-10-CM

## 2022-08-12 DIAGNOSIS — Z20822 Contact with and (suspected) exposure to covid-19: Secondary | ICD-10-CM | POA: Diagnosis present

## 2022-08-12 DIAGNOSIS — J9601 Acute respiratory failure with hypoxia: Secondary | ICD-10-CM | POA: Diagnosis present

## 2022-08-12 DIAGNOSIS — E871 Hypo-osmolality and hyponatremia: Secondary | ICD-10-CM | POA: Diagnosis present

## 2022-08-12 DIAGNOSIS — R571 Hypovolemic shock: Secondary | ICD-10-CM | POA: Diagnosis present

## 2022-08-12 DIAGNOSIS — E1169 Type 2 diabetes mellitus with other specified complication: Secondary | ICD-10-CM | POA: Diagnosis present

## 2022-08-12 DIAGNOSIS — F17210 Nicotine dependence, cigarettes, uncomplicated: Secondary | ICD-10-CM | POA: Diagnosis present

## 2022-08-12 DIAGNOSIS — F32A Depression, unspecified: Secondary | ICD-10-CM | POA: Diagnosis present

## 2022-08-12 DIAGNOSIS — E785 Hyperlipidemia, unspecified: Secondary | ICD-10-CM | POA: Diagnosis present

## 2022-08-12 DIAGNOSIS — E860 Dehydration: Secondary | ICD-10-CM | POA: Diagnosis present

## 2022-08-12 DIAGNOSIS — N179 Acute kidney failure, unspecified: Principal | ICD-10-CM | POA: Diagnosis present

## 2022-08-12 DIAGNOSIS — R001 Bradycardia, unspecified: Secondary | ICD-10-CM | POA: Diagnosis not present

## 2022-08-12 DIAGNOSIS — E861 Hypovolemia: Secondary | ICD-10-CM | POA: Diagnosis present

## 2022-08-12 DIAGNOSIS — I110 Hypertensive heart disease with heart failure: Secondary | ICD-10-CM | POA: Diagnosis present

## 2022-08-12 DIAGNOSIS — Z79899 Other long term (current) drug therapy: Secondary | ICD-10-CM

## 2022-08-12 DIAGNOSIS — I4891 Unspecified atrial fibrillation: Secondary | ICD-10-CM | POA: Diagnosis not present

## 2022-08-12 DIAGNOSIS — Z7901 Long term (current) use of anticoagulants: Secondary | ICD-10-CM

## 2022-08-12 DIAGNOSIS — Z8249 Family history of ischemic heart disease and other diseases of the circulatory system: Secondary | ICD-10-CM

## 2022-08-12 DIAGNOSIS — K219 Gastro-esophageal reflux disease without esophagitis: Secondary | ICD-10-CM | POA: Diagnosis present

## 2022-08-12 DIAGNOSIS — I5032 Chronic diastolic (congestive) heart failure: Secondary | ICD-10-CM

## 2022-08-12 DIAGNOSIS — Z8049 Family history of malignant neoplasm of other genital organs: Secondary | ICD-10-CM

## 2022-08-12 LAB — CBC WITH DIFFERENTIAL/PLATELET
Abs Immature Granulocytes: 0.04 10*3/uL (ref 0.00–0.07)
Basophils Absolute: 0 10*3/uL (ref 0.0–0.1)
Basophils Relative: 0 %
Eosinophils Absolute: 0.1 10*3/uL (ref 0.0–0.5)
Eosinophils Relative: 1 %
HCT: 27.8 % — ABNORMAL LOW (ref 36.0–46.0)
Hemoglobin: 8.6 g/dL — ABNORMAL LOW (ref 12.0–15.0)
Immature Granulocytes: 0 %
Lymphocytes Relative: 15 %
Lymphs Abs: 1.5 10*3/uL (ref 0.7–4.0)
MCH: 28.5 pg (ref 26.0–34.0)
MCHC: 30.9 g/dL (ref 30.0–36.0)
MCV: 92.1 fL (ref 80.0–100.0)
Monocytes Absolute: 0.9 10*3/uL (ref 0.1–1.0)
Monocytes Relative: 8 %
Neutro Abs: 7.8 10*3/uL — ABNORMAL HIGH (ref 1.7–7.7)
Neutrophils Relative %: 76 %
Platelets: 194 10*3/uL (ref 150–400)
RBC: 3.02 MIL/uL — ABNORMAL LOW (ref 3.87–5.11)
RDW: 15.9 % — ABNORMAL HIGH (ref 11.5–15.5)
WBC: 10.4 10*3/uL (ref 4.0–10.5)
nRBC: 0 % (ref 0.0–0.2)

## 2022-08-12 LAB — COMPREHENSIVE METABOLIC PANEL
ALT: 11 U/L (ref 0–44)
AST: 19 U/L (ref 15–41)
Albumin: 3.6 g/dL (ref 3.5–5.0)
Alkaline Phosphatase: 57 U/L (ref 38–126)
Anion gap: 8 (ref 5–15)
BUN: 21 mg/dL (ref 8–23)
CO2: 22 mmol/L (ref 22–32)
Calcium: 7.4 mg/dL — ABNORMAL LOW (ref 8.9–10.3)
Chloride: 102 mmol/L (ref 98–111)
Creatinine, Ser: 4.07 mg/dL — ABNORMAL HIGH (ref 0.44–1.00)
GFR, Estimated: 12 mL/min — ABNORMAL LOW (ref >60)
Glucose, Bld: 108 mg/dL — ABNORMAL HIGH (ref 70–99)
Potassium: 4.2 mmol/L (ref 3.5–5.1)
Sodium: 132 mmol/L — ABNORMAL LOW (ref 135–145)
Total Bilirubin: 0.4 mg/dL (ref 0.3–1.2)
Total Protein: 6.1 g/dL — ABNORMAL LOW (ref 6.5–8.1)

## 2022-08-12 LAB — MAGNESIUM: Magnesium: 1.5 mg/dL — ABNORMAL LOW (ref 1.7–2.4)

## 2022-08-12 LAB — BRAIN NATRIURETIC PEPTIDE
B Natriuretic Peptide: 555.6 pg/mL — ABNORMAL HIGH (ref 0.0–100.0)
B Natriuretic Peptide: 497.2 pg/mL — ABNORMAL HIGH (ref 0.0–100.0)

## 2022-08-12 LAB — GLUCOSE, CAPILLARY: Glucose-Capillary: 159 mg/dL — ABNORMAL HIGH (ref 70–99)

## 2022-08-12 LAB — LACTIC ACID, PLASMA
Lactic Acid, Venous: 2.3 mmol/L (ref 0.5–1.9)
Lactic Acid, Venous: 1.5 mmol/L (ref 0.5–1.9)

## 2022-08-12 LAB — PROTIME-INR
INR: 1.7 — ABNORMAL HIGH (ref 0.8–1.2)
Prothrombin Time: 19.7 seconds — ABNORMAL HIGH (ref 11.4–15.2)

## 2022-08-12 LAB — SARS CORONAVIRUS 2 BY RT PCR: SARS Coronavirus 2 by RT PCR: NEGATIVE

## 2022-08-12 LAB — PHOSPHORUS: Phosphorus: 6 mg/dL — ABNORMAL HIGH (ref 2.5–4.6)

## 2022-08-12 LAB — TROPONIN I (HIGH SENSITIVITY): Troponin I (High Sensitivity): 13 ng/L (ref <18)

## 2022-08-12 LAB — CBG MONITORING, ED: Glucose-Capillary: 116 mg/dL — ABNORMAL HIGH (ref 70–99)

## 2022-08-12 MED ORDER — HYDROMORPHONE HCL 1 MG/ML IJ SOLN
0.5000 mg | Freq: Three times a day (TID) | INTRAMUSCULAR | Status: DC | PRN
  Administered 2022-08-12: 0.5 mg via INTRAVENOUS
  Filled 2022-08-12: qty 0.5

## 2022-08-12 MED ORDER — ESCITALOPRAM OXALATE 10 MG PO TABS
10.0000 mg | ORAL_TABLET | Freq: Every morning | ORAL | Status: DC
  Administered 2022-08-13 – 2022-08-17 (×5): 10 mg via ORAL
  Filled 2022-08-12 (×4): qty 1

## 2022-08-12 MED ORDER — OXYCODONE HCL 5 MG PO TABS
5.0000 mg | ORAL_TABLET | Freq: Three times a day (TID) | ORAL | Status: DC | PRN
  Administered 2022-08-13 (×2): 5 mg via ORAL
  Filled 2022-08-12 (×2): qty 1

## 2022-08-12 MED ORDER — ACETAMINOPHEN 500 MG PO TABS
1000.0000 mg | ORAL_TABLET | Freq: Four times a day (QID) | ORAL | Status: DC | PRN
  Administered 2022-08-12 – 2022-08-13 (×2): 1000 mg via ORAL
  Filled 2022-08-12 (×4): qty 2

## 2022-08-12 MED ORDER — LACTATED RINGERS IV SOLN: INTRAVENOUS | Status: AC

## 2022-08-12 MED ORDER — GABAPENTIN 300 MG PO CAPS
300.0000 mg | ORAL_CAPSULE | Freq: Two times a day (BID) | ORAL | Status: DC
  Administered 2022-08-13 – 2022-08-17 (×10): 300 mg via ORAL
  Filled 2022-08-12 (×10): qty 1

## 2022-08-12 MED ORDER — PANTOPRAZOLE SODIUM 40 MG PO TBEC
40.0000 mg | DELAYED_RELEASE_TABLET | Freq: Every day | ORAL | Status: DC
  Administered 2022-08-12 – 2022-08-17 (×6): 40 mg via ORAL
  Filled 2022-08-12 (×6): qty 1

## 2022-08-12 MED ORDER — SODIUM CHLORIDE 0.9 % IV SOLN
3.0000 g | INTRAVENOUS | Status: DC
  Administered 2022-08-12: 3 g via INTRAVENOUS
  Filled 2022-08-12: qty 8

## 2022-08-12 MED ORDER — BUPROPION HCL ER (XL) 150 MG PO TB24
300.0000 mg | ORAL_TABLET | Freq: Every morning | ORAL | Status: DC
  Administered 2022-08-13 – 2022-08-17 (×5): 300 mg via ORAL
  Filled 2022-08-12 (×2): qty 2
  Filled 2022-08-12 (×2): qty 1
  Filled 2022-08-12 (×2): qty 2

## 2022-08-12 MED ORDER — SODIUM CHLORIDE 0.9 % IV SOLN: 1000.0000 mL | INTRAVENOUS | Status: DC

## 2022-08-12 MED ORDER — SODIUM CHLORIDE 0.9 % IV BOLUS (SEPSIS)
500.0000 mL | Freq: Once | INTRAVENOUS | Status: AC
  Administered 2022-08-12: 500 mL via INTRAVENOUS

## 2022-08-12 MED ORDER — HYDROMORPHONE HCL 1 MG/ML IJ SOLN
1.0000 mg | INTRAMUSCULAR | Status: DC | PRN
  Administered 2022-08-12 – 2022-08-13 (×3): 1 mg via INTRAVENOUS
  Filled 2022-08-12 (×3): qty 1

## 2022-08-12 MED ORDER — HEPARIN (PORCINE) 25000 UT/250ML-% IV SOLN
1100.0000 [IU]/h | INTRAVENOUS | Status: DC
  Administered 2022-08-12 – 2022-08-13 (×2): 1300 [IU]/h via INTRAVENOUS
  Administered 2022-08-14 – 2022-08-16 (×3): 1100 [IU]/h via INTRAVENOUS
  Filled 2022-08-12 (×5): qty 250

## 2022-08-12 MED ORDER — TRAZODONE HCL 100 MG PO TABS
100.0000 mg | ORAL_TABLET | Freq: Every evening | ORAL | Status: DC | PRN
  Administered 2022-08-13 – 2022-08-16 (×5): 100 mg via ORAL
  Filled 2022-08-12: qty 2
  Filled 2022-08-12 (×4): qty 1

## 2022-08-12 MED ORDER — INSULIN ASPART 100 UNIT/ML IJ SOLN
0.0000 [IU] | Freq: Three times a day (TID) | INTRAMUSCULAR | Status: DC
  Administered 2022-08-12: 2 [IU] via SUBCUTANEOUS

## 2022-08-12 MED ORDER — ARIPIPRAZOLE 5 MG PO TABS
5.0000 mg | ORAL_TABLET | Freq: Every day | ORAL | Status: DC
  Administered 2022-08-13 – 2022-08-17 (×5): 5 mg via ORAL
  Filled 2022-08-12 (×5): qty 1

## 2022-08-12 MED ORDER — NOREPINEPHRINE 4 MG/250ML-% IV SOLN: 0.0000 ug/min | INTRAVENOUS | Status: DC

## 2022-08-12 MED ORDER — SODIUM CHLORIDE 0.9 % IV SOLN
250.0000 mL | INTRAVENOUS | Status: DC
  Administered 2022-08-12 (×2): 250 mL via INTRAVENOUS

## 2022-08-12 MED ORDER — NOREPINEPHRINE 4 MG/250ML-% IV SOLN
2.0000 ug/min | INTRAVENOUS | Status: DC
  Administered 2022-08-12: 2 ug/min via INTRAVENOUS
  Filled 2022-08-12 (×2): qty 250

## 2022-08-12 NOTE — ED Notes (Signed)
[place] iv placement at this time for medications.

## 2022-08-12 NOTE — Progress Notes (Signed)
Cardiology Office Note:    Date:  08/12/2022   ID:  [name], DOB 07/10/1961, MRN [id number]  PCP:  [name]  Cardiologist:  Werner Lean, MD   Referring MD: [facility]   No chief complaint on file.   History of Present Illness:    [name] is a 61 y.o. female with a hx of systolic CHF, paroxysmal A-fib with valvular disease on warfarin, DM-2, mitral valve and tricuspid valve repair, HTN, recent hospitalization on 8/21 for GI bleed on 8/28 for syncope/dehydration returning with an acute dyspnea, orthopnea, cough, chest heaviness and elevated BP, and admitted for acute respiratory failure with hypoxia due to CHF exacerbation and hypertensive emergency.  Since she was taken off diuretics when she was last here for syncope and dehydration on 8/28.    She comes to the office today with complaints of near syncope and extreme weakness over the past 24 hours.  She was driving yesterday and was very dizzy.  She was trying to go to the drugstore to pick her meds up and nearly fainted.  She called this morning with triage by Kelli Churn as follows: "Called patient back about her message. Patient stated she tried to go out yesterday and almost fell 6 times and passed out once. Patient stated she cannot live like this. Patient stated she feels like she cannot leave the house because she feels like she is going to pass out. Patient stated she needs to see someone ASAP. Patient has appointment with Dr. Gasper Sells on Tuesday. Patient stated she cannot wait, informed patient that our office is not a place to go for emergencies. Patient stated she is aware, but she needs to see someone. Patient has been out of the hospital for 2 days and has been in and out of the ER several times in the last couple of months. Made patient an appointment with Dr. Tamala Julian, DOD, for evaluation today. Patient agreed to plan."  This morning she is coherent.  We did document  blood pressures while sitting of 70/40 mmHg in both arms.  Since June 25 the patient is a been admitted to the hospital on 5 occasions, the majority of which have been related to low blood pressure but on 08/05/2022 she was admitted with acute pulmonary edema after diuretic therapy had been discontinued.  She had hematochezia associated with significant anemia requiring admission to the hospital on 08/02/2022 and discharged on 08/04/2022.  Past Medical History:  Diagnosis Date   Acute pericardial effusion 05/17/2022   AKI (acute kidney injury) ([place]) 05/17/2022   Anxiety    Arthritis    Cervicalgia    CHF (congestive heart failure) (HCC)    Chondromalacia of patella    Chronic neck pain    Chronic pain syndrome    Depression    secondary to loss of her son at age 19   Diabetic neuropathy ([place])    DMII (diabetes mellitus, type 2) ([place])    Dysrhythmia    Enthesopathy of hip region    Fibromyalgia    GERD (gastroesophageal reflux disease)    Hep C w/o coma, chronic ([place])    Hepatitis C    diagnosed 2005   Hypertension    Insomnia    Low back pain    Lumbago    Mitral regurgitation    Obesity    Pain in joint, upper arm    Primary localized osteoarthrosis, lower leg    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  Substance abuse ([place])    sober since 2001   Tobacco abuse    Tricuspid regurgitation    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    Past Surgical History:  Procedure Laterality Date   ABDOMINAL HYSTERECTOMY     at age 70, unknown reasons   CARPAL TUNNEL RELEASE  12/07/2011   left side   COLONOSCOPY WITH PROPOFOL N/A 07/30/2022   Procedure: COLONOSCOPY WITH PROPOFOL;  Surgeon: Ladene Artist, MD;  Location: [facility] ENDOSCOPY;  Service: Gastroenterology;  Laterality: N/A;   EYE SURGERY     FOOT SURGERY Bilateral    HOT HEMOSTASIS N/A 07/30/2022   Procedure: HOT HEMOSTASIS (ARGON PLASMA COAGULATION/BICAP);  Surgeon: Ladene Artist, MD;  Location: [place];  Service: Gastroenterology;  Laterality: N/A;   MITRAL VALVE REPAIR N/A 04/19/2022   Procedure: MITRAL VALVE REPAIR WITH 30MM PHYSIO II ANNULOPLASTY RING;  Surgeon: Melrose Nakayama, MD;  Location: [place];  Service: Open Heart Surgery;  Laterality: N/A;   PERICARDIOCENTESIS N/A 05/10/2022   Procedure: PERICARDIOCENTESIS;  Surgeon: Jettie Booze, MD;  Location: [place] CV LAB;  Service: Cardiovascular;  Laterality: N/A;   POLYPECTOMY  07/30/2022   Procedure: POLYPECTOMY;  Surgeon: Ladene Artist, MD;  Location: [facility] ENDOSCOPY;  Service: Gastroenterology;;   RIGHT/LEFT HEART CATH AND CORONARY ANGIOGRAPHY N/A 02/12/2022   Procedure: RIGHT/LEFT HEART CATH AND CORONARY ANGIOGRAPHY;  Surgeon: Belva Crome, MD;  Location: [place] [facility];  Service: Cardiovascular;  Laterality: N/A;   TEE WITHOUT CARDIOVERSION N/A 02/03/2022   Procedure: TRANSESOPHAGEAL ECHOCARDIOGRAM (TEE);  Surgeon: Josue Hector, MD;  Location: [facility] ENDOSCOPY;  Service: Cardiovascular;  Laterality: N/A;   TEE WITHOUT CARDIOVERSION N/A 04/19/2022   Procedure: TRANSESOPHAGEAL ECHOCARDIOGRAM (TEE);  Surgeon: Melrose Nakayama, MD;  Location: [place];  Service: Open Heart Surgery;  Laterality: N/A;   TEE WITHOUT CARDIOVERSION N/A 07/28/2022   Procedure: TRANSESOPHAGEAL ECHOCARDIOGRAM (TEE);  Surgeon: Sanda Klein, MD;  Location: [place];  Service: Cardiovascular;  Laterality: N/A;   TRICUSPID VALVE REPLACEMENT N/A 04/19/2022   Procedure: TRICUSPID VALVE REPAIR WITH 32MM MC3 ANNULOPLASTY RING;  Surgeon: Melrose Nakayama, MD;  Location: [place];  Service: Open Heart Surgery;  Laterality: N/A;    Current Medications: Current Meds  Medication Sig   acetaminophen (TYLENOL) 500 MG tablet Take 1,000 mg by mouth every 6 (six) hours as needed for mild pain.   albuterol (PROVENTIL HFA;VENTOLIN HFA) 108 (90 Base) MCG/ACT inhaler Inhale 2 puffs into the lungs every 6 (six) hours as needed for wheezing or  shortness of breath.    ARIPiprazole (ABILIFY) 5 MG tablet Take 5 mg by mouth daily.   atorvastatin (LIPITOR) 20 MG tablet Take 20 mg by mouth daily.   buPROPion (WELLBUTRIN XL) 300 MG 24 hr tablet Take 300 mg by mouth in the morning.   dexlansoprazole (DEXILANT) 60 MG capsule Take 1 capsule by mouth daily.   escitalopram (LEXAPRO) 10 MG tablet Take 10 mg by mouth every morning.   FARXIGA 10 MG TABS tablet Take 10 mg by mouth daily.   furosemide (LASIX) 20 MG tablet Take 20 mg by mouth daily. Pt takes 1/2 tablet 10 mg once daily.   gabapentin (NEURONTIN) 600 MG tablet Take 0.5 tablets (300 mg total) by mouth 3 (three) times daily.   losartan (COZAAR) 100 MG tablet Take 1 tablet (100 mg total) by mouth daily. Hold until you see your physician   naloxone [facility]) nasal spray 4 mg/0.1 mL Place 1 spray  into the nose as needed (accidental overdose).   neomycin-polymyxin b-dexamethasone (MAXITROL) 3.5-10000-0.1 SUSP Place 1 drop into both eyes daily at 12 noon.   nicotine (NICODERM CQ - DOSED IN MG/24 HOURS) 14 mg/24hr patch Place 1 patch (14 mg total) onto the skin daily.   oxyCODONE (ROXICODONE) 15 MG immediate release tablet Take 0.5 tablets (7.5 mg total) by mouth every 12 (twelve) hours as needed for pain.   PREMARIN 0.45 MG tablet Take 0.45 mg by mouth daily.   spironolactone (ALDACTONE) 25 MG tablet Take 12.5 mg by mouth daily.   traZODone (DESYREL) 100 MG tablet Take 1 tablet (100 mg total) by mouth at bedtime as needed for sleep.   warfarin (COUMADIN) 5 MG tablet TAKE 1 TABLET BY MOUTH DAILY OR AS DIRECTED BY ANTICOAGULATION CLINIC. (Patient taking differently: Take 5 mg by mouth daily. Take 1 tablet by mouth daily and 1.5 on sundays)     Allergies:   Norco [hydrocodone-acetaminophen]   Social History   Socioeconomic History   Marital status: Single    Spouse name: Not on file   Number of children: 2   Years of education: Not on file   Highest education level: Not on file   Occupational History   Occupation: CNA    Comment: worked for 15 years   Occupation: disability  Tobacco Use   Smoking status: Every Day    Packs/day: 0.25    Years: 35.00    Total pack years: 8.75    Types: Cigarettes   Smokeless tobacco: Never   Tobacco comments:    4 cigarettes a day  Vaping Use   Vaping Use: Some days  Substance and Sexual Activity   Alcohol use: No   Drug use: No   Sexual activity: Not Currently    Comment: Celebate since 2000  Other Topics Concern   Not on file  Social History Narrative   Born and raised by mom until mom died when she was 14yo. After that she stayed with her aunt. Dad was married to someone else and was never around.  Pt has 1 brother and 1 sister and pt is the middle child. Pt had a very rough childhood. Never married. 2 kids one son died several  yrs ago and other son has Schizophrenia. Pt has graduated HS and has some college. Pt is on disability and is unemployed. 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       Financial assistance approved for 100% discount at [facility] and has [facility] card   [employer]  September 29, 2010 2:27 PM   Social Determinants of Health   Financial Resource Strain: Not on file  Food Insecurity: Not on file  Transportation Needs: Not on file  Physical Activity: Not on file  Stress: Not on file  Social Connections: Not on file     Family History: The patient's family history includes Alcohol abuse in her brother, father, and sister; Arthritis in an other family member; Diabetes in an other family member; Drug abuse in her brother and sister; Heart attack (age of onset: 70) in her son; Hypertension in an other family member; Schizophrenia in her son; Uterine cancer in her mother.  ROS:   Please see the history of present illness.    Has not noticed dark or red bowel movements.  Denies chest pain.  No tachycardia or palpitations.  All other systems reviewed and are negative.  EKGs/Labs/Other Studies Reviewed:     The following studies were  reviewed today:  2D Doppler echocardiogram 07/27/2022: IMPRESSIONS    1. Left ventricular ejection fraction, by estimation, is 50 to 55%. The  left ventricle has low normal function.   2. Left atrial size was severely dilated.   3. Right atrial size was moderately dilated.   4. S/p MV prosthetic annuloplasty ring placed 04/19/2022. mod-severe  mitral stenosis, MV mean gradient 15 mmHg, peak 27 mmhg at HR 87 bpm. Mild  mitral valve regurgitation. There is a present in the mitral position.  Procedure Date: 04/19/22.   5. 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  Tricuspid valve regurgitation is moderate.   6. Aortic valve regurgitation is not visualized.   7. There is severely elevated pulmonary artery systolic pressure.   Conclusion(s)/Recommendation(s): Compared to prior study, MV mean gradient  has increased. May be related to rate. Consider TEE/cardiac CT if  clinically indicated.   Transesophageal echo on 07/28/2022: IMPRESSIONS     1. Left ventricular ejection fraction, by estimation, is 60 to 65%. The  left ventricle has normal function. The left ventricle has no regional  wall motion abnormalities. There is mild concentric left ventricular  hypertrophy. Left ventricular diastolic  function could not be evaluated.   2. Right ventricular systolic function is normal. The right ventricular  size is normal. There is moderately elevated pulmonary artery systolic  pressure. The estimated right ventricular systolic pressure is 24.0 mmHg.   3. Left atrial size was severely dilated. No left atrial/left atrial  appendage thrombus was detected. The LAA emptying velocity was 70 cm/s.   4. Right atrial size was mildly dilated.   5. The mitral valve has been repaired/replaced. Mild mitral valve  regurgitation. Mild mitral stenosis. The mean mitral valve gradient is 6.6  mmHg with average heart rate of 83 bpm. There is a prosthetic  annuloplasty  ring present in the mitral position.  Procedure Date: 04/19/22.   6. The tricuspid valve is has been repaired/replaced. The tricuspid valve  is status post repair with an annuloplasty ring. Tricuspid valve  regurgitation is moderate.   7. The aortic valve is tricuspid. Aortic valve regurgitation is not  visualized. No aortic stenosis is present.    EKG:  EKG sinus bradycardia 57 bpm, left atrial abnormality, diffuse T wave inversion.  T wave abnormality is not new.  Compared to prior tracing heart rate is slower.  Recent Labs: 08/03/2022: TSH 1.840 08/05/2022: ALT 13 08/10/2022: B Natriuretic Peptide 713.2; BUN 10; Creatinine, Ser 0.99; Hemoglobin 9.9; Magnesium 1.8; Platelets 204; Potassium 3.8; Sodium 140  Recent Lipid Panel    Component Value Date/Time   CHOL 180 03/25/2016 0909   TRIG 122 03/25/2016 0909   HDL 39 (L) 03/25/2016 0909   CHOLHDL 4.6 03/25/2016 0909   VLDL 24 03/25/2016 0909   LDLCALC 117 03/25/2016 0909    Physical Exam:    VS:  BP (!) 82/42   Pulse (!) 57   Ht '5\' 11"'$  (1.803 m)   Wt 210 lb (95.3 kg)   LMP 12/06/1976   BMI 29.29 kg/m     Wt Readings from Last 3 Encounters:  08/12/22 210 lb (95.3 kg)  08/09/22 194 lb 11.2 oz (88.3 kg)  08/04/22 216 lb 3.2 oz (98.1 kg)  Blood pressure repeated for confirmation and noted to be 70/30 left arm and 70/40 mmHg right arm while sitting using a relatively large cuff.  GEN: Awake and coherent.. No acute distress HEENT: Normal NECK: No JVD. LYMPHATICS: No  lymphadenopathy CARDIAC: No murmur. RRR no gallop, or edema. VASCULAR:  Normal Pulses. No bruits. RESPIRATORY:  Clear to auscultation without rales, wheezing or rhonchi  ABDOMEN: Soft, non-tender, non-distended, No pulsatile mass, MUSCULOSKELETAL: No deformity  SKIN: Warm and dry NEUROLOGIC:  Alert and oriented x 3 PSYCHIATRIC:  Normal affect   ASSESSMENT:    1. Near syncope   2. Severe hypotension   3. Atrial fibrillation, unspecified type  ([place])   4. 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   5. 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   6. Chronic heart failure with preserved ejection fraction (HFpEF) (HCC)    PLAN:    In order of problems listed above:  Low blood pressure today.  She has had hematochezia in the past but also overdiuresis in the past.  She has had no medications yet today.  She is severely hypotensive and has no evidence of volume excess with clear lungs and flat neck veins.  Her skin is somewhat moist.  She is sitting in a wheelchair in the office.  She will be readmitted to the hospital, medications need to be adjusted, need to exclude active bleeding, and should probably have cardiology input and primary lead since this patient has bounced back multiple times within the past 4 weeks.  Engineer, maintenance (IT) notified of patient's transport to the emergency room.  Consider processes that could be aggravating her blood pressure such as hypoadrenalism and or autonomic dysfunction.   Medication Adjustments/Labs and Tests Ordered: Current medicines are reviewed at length with the patient today.  Concerns regarding medicines are outlined above.  No orders of the defined types were placed in this encounter.  No orders of the defined types were placed in this encounter.   There are no Patient Instructions on file for this visit.   Signed, Sinclair Grooms, MD  08/12/2022 12:20 PM    [facility]

## 2022-08-12 NOTE — ED Provider Notes (Signed)
[facility] EMERGENCY DEPARTMENT Provider Note   CSN: [id number] Arrival date & time: 08/12/22  1241     History  No chief complaint on file.   [name] is a 61 y.o. female.  [name] is a 61 y.o. female transported to emergency department from the cardiologist office for shortness of breath and near syncope episode.   Pt has H/O systolic CHF, paroxysmal A-fib with valvular disease on warfarin, DM-2, HTN, head frequent hospital admissions secondary to SOB, syncope, hypoxemia, hypotension    Her recent hospitalization was on 8/21 for GI bleed and transfused 1 unit with appropriate response. She was admitted again on 8/28 for syncope/dehydration and admitted for acute respiratory failure with hypoxia due to CHF exacerbation and hypertensive emergency. He was on BiPAP and weaned off on 4L. She is on home O2 since.  Pt c/o shortness of breath, feeling extreme dizzy and lightheaded, feeling like passing out if she stands up.  Upon arrival patient is hypotensive, hypoxic, appears somnolent.  Patient denies chest pain, palpitations, N/V/D, abdominal pain. Pt denies blood in the stool. Pt denies fever, chills, cough or dysuria. Her Blood pressure systolic around 25'Z and diastolic in 56'L. SPO2 drops in 80's during communication.     The history is provided by the patient. No language interpreter was used.       Home Medications Prior to Admission medications   Medication Sig Start Date End Date Taking? Authorizing Provider  acetaminophen (TYLENOL) 500 MG tablet Take 1,000 mg by mouth every 6 (six) hours as needed for mild pain.    [provider]  albuterol (PROVENTIL HFA;VENTOLIN HFA) 108 (90 Base) MCG/ACT inhaler Inhale 2 puffs into the lungs every 6 (six) hours as needed for wheezing or shortness of breath.     [provider]  ARIPiprazole (ABILIFY) 5 MG tablet Take 5 mg by mouth daily. 07/20/22   [provider]   atorvastatin (LIPITOR) 20 MG tablet Take 20 mg by mouth daily.    [provider]  buPROPion (WELLBUTRIN XL) 300 MG 24 hr tablet Take 300 mg by mouth in the morning. 03/24/22   [provider]  dexlansoprazole (DEXILANT) 60 MG capsule Take 1 capsule by mouth daily. 07/26/22   [provider]  escitalopram (LEXAPRO) 10 MG tablet Take 10 mg by mouth every morning. 07/08/22   [provider]  FARXIGA 10 MG TABS tablet Take 10 mg by mouth daily. 08/10/22   [provider]  furosemide (LASIX) 20 MG tablet Take 20 mg by mouth daily. Pt takes 1/2 tablet 10 mg once daily.    [provider]  gabapentin (NEURONTIN) 600 MG tablet Take 0.5 tablets (300 mg total) by mouth 3 (three) times daily. 05/30/22   [name], [name], MD  losartan (COZAAR) 100 MG tablet Take 1 tablet (100 mg total) by mouth daily. Hold until you see your physician 08/04/22   Lorella Nimrod, MD  naloxone [facility]) nasal spray 4 mg/0.1 mL Place 1 spray into the nose as needed (accidental overdose).    [provider]  neomycin-polymyxin b-dexamethasone (MAXITROL) 3.5-10000-0.1 SUSP Place 1 drop into both eyes daily at 12 noon. 08/11/22   [provider]  nicotine (NICODERM CQ - DOSED IN MG/24 HOURS) 14 mg/24hr patch Place 1 patch (14 mg total) onto the skin daily. 08/10/22   Mercy Riding, MD  oxyCODONE (ROXICODONE) 15 MG immediate release tablet Take 0.5 tablets (7.5 mg total) by mouth every 12 (twelve) hours  as needed for pain. 08/04/22   Lorella Nimrod, MD  PREMARIN 0.45 MG tablet Take 0.45 mg by mouth daily. 01/11/22   [provider]  spironolactone (ALDACTONE) 25 MG tablet Take 12.5 mg by mouth daily. 07/26/22   [provider]  traZODone (DESYREL) 100 MG tablet Take 1 tablet (100 mg total) by mouth at bedtime as needed for sleep. 08/04/22   Lorella Nimrod, MD  warfarin (COUMADIN) 5 MG tablet TAKE 1 TABLET BY MOUTH DAILY OR AS DIRECTED BY ANTICOAGULATION  CLINIC. Patient taking differently: Take 5 mg by mouth daily. Take 1 tablet by mouth daily and 1.5 on sundays 07/02/22   Werner Lean, MD      Allergies    Norco [hydrocodone-acetaminophen]    Review of Systems   Review of Systems  Constitutional:  Positive for fatigue.  Respiratory:  Positive for shortness of breath.   Skin:  Negative for color change and rash.  Neurological:  Positive for dizziness, syncope, weakness and light-headedness.    Physical Exam Updated Vital Signs BP 105/73   Pulse 62   Resp 19   LMP 12/06/1976   SpO2 93%  Physical Exam Vitals and nursing note reviewed.  HENT:     Head: Normocephalic and atraumatic.  Eyes:     Extraocular Movements: Extraocular movements intact.     Conjunctiva/sclera: Conjunctivae normal.     Pupils: Pupils are equal, round, and reactive to light.  Cardiovascular:     Rate and Rhythm: Regular rhythm. Bradycardia present.  Pulmonary:     Effort: Pulmonary effort is normal. No respiratory distress.     Breath sounds: Normal breath sounds. No wheezing, rhonchi or rales.  Abdominal:     General: There is no distension.     Tenderness: There is no abdominal tenderness. There is no guarding.  Musculoskeletal:        General: No swelling or tenderness.     Cervical back: Neck supple.  Skin:    General: Skin is dry.     Findings: No bruising, erythema or rash.  Neurological:     General: No focal deficit present.     Mental Status: She is alert and oriented to person, place, and time. Mental status is at baseline.     GCS: GCS eye subscore is 4. GCS verbal subscore is 5. GCS motor subscore is 6.     Cranial Nerves: Cranial nerves 2-12 are intact. No cranial nerve deficit or dysarthria.     Sensory: Sensation is intact. No sensory deficit.     Motor: Motor function is intact. No weakness or tremor.  Psychiatric:        Mood and Affect: Mood normal.     ED Results / Procedures / Treatments   Labs (all labs  ordered are listed, but only abnormal results are displayed) Labs Reviewed  CBC WITH DIFFERENTIAL/PLATELET - Abnormal; Notable for the following components:      Result Value   RBC 3.02 (*)    Hemoglobin 8.6 (*)    HCT 27.8 (*)    RDW 15.9 (*)    Neutro Abs 7.8 (*)    All other components within normal limits  CBG MONITORING, ED - Abnormal; Notable for the following components:   Glucose-Capillary 116 (*)    All other components within normal limits  SARS CORONAVIRUS 2 BY RT PCR  CULTURE, BLOOD (ROUTINE X 2)  CULTURE, BLOOD (ROUTINE X 2)  URINE CULTURE  COMPREHENSIVE METABOLIC PANEL  BRAIN NATRIURETIC PEPTIDE  URINALYSIS, ROUTINE W REFLEX MICROSCOPIC  LACTIC ACID, PLASMA  LACTIC ACID, PLASMA    EKG EKG Interpretation  Date/Time:  Thursday August 12 2022 12:57:41 EDT Ventricular Rate:  56 PR Interval:  184 QRS Duration: 143 QT Interval:  531 QTC Calculation: 513 R Axis:   69 Text Interpretation: Sinus or ectopic atrial rhythm Right bundle branch block Abnormal T, consider ischemia, diffuse leads when compared to ecg several days ago, similar t wave inversions. No STEMI Confirmed by Antony Blackbird [phone]) on 08/12/2022 12:59:22 PM  Radiology DG Chest 2 View  Result Date: 08/12/2022 CLINICAL DATA:  Shortness of breath and lightheaded. EXAM: CHEST - 2 VIEW COMPARISON:  08/06/2022 FINDINGS: The cardiac silhouette, mediastinal and hilar contours are within normal limits and stable. 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 The lungs are clear of an acute process. No infiltrates, edema or effusions. No pulmonary lesions. The bony thorax is intact. IMPRESSION: No acute cardiopulmonary findings. Electronically Signed   By: Marijo Sanes M.D.   On: 08/12/2022 14:22    Procedures Procedures    Medications Ordered in ED Medications  sodium chloride 0.9 % bolus 500 mL (500 mLs Intravenous New Bag/Given 08/12/22 1428)    Followed by  0.9 %   sodium chloride infusion (has no administration in time range)    ED Course/ Medical Decision Making/ A&P                           Medical Decision Making Karsynn Deweese is a 61 y/o female with a history of CHF, A-fib with valvular repair on Warfarin, HTN, diabetes mellitus type 2 referred to ED by Cardiologist for admission for recurrent episodes of syncope/near syncope, shortness of breath and feeling dizzy and lightheaded. Pt was in the cardiologist office today where she became symptomatic with feeling dizzy, lightheaded and SOB.  She was transferred to emergency department via ambulance for further evaluation.  Upon arrival patient complaining dizzy, lightheaded and SOB and stated she could pass out if she stands up.  She denies chest pain, palpitations, visual changes, abdominal pain, nausea, vomiting, diarrhea or blood in the stool. She is hypotensive, hypoxic and afebrile.   Patient had frequent hospital admissions in the past month for similar complaints.  Physical Exam: Skin color normal without rash. S1S2 normal. Lungs CTA. Abdomen soft , non tender. No guarding. No pedal edema.  Patient is nontoxic-appearing.  She is comfortably communicating with complete sentences.  No signs of fluid overload.   We will gets labs including CBC, CMP, BNP, Urine and blood culture, CXR, and SARS swab which will help with further investigation, treatment and disposition.   EKG: Sinus rhythm with T inversion in multiple leads. Comparison with previous EKG reflected the same. No acute ST changes appreciated. CXR: No acute cardio pulmonary disease    Her Vital signs  and MAP dramatically improved with IV fluids. FS: 116  Amount and/or Complexity of Data Reviewed Labs: ordered. Radiology: ordered.  Risk Prescription drug management.   Report given to Critical care. They will evaluate the patient to determine the level of care.         Final Clinical Impression(s) / ED  Diagnoses Final diagnoses:  None    Rx / DC Orders ED Discharge Orders     None         Teola Bradley, MD 08/12/22 1453    Tegeler, Gwenyth Allegra, MD 08/13/22 1537

## 2022-08-12 NOTE — Telephone Encounter (Signed)
Called patient back about her message. Patient stated she tried to go out yesterday and almost fell 6 times and passed out once. Patient stated she cannot live like this. Patient stated she feels like she cannot leave the house because she feels like she is going to pass out. Patient stated she needs to see someone ASAP. Patient has appointment with Dr. Gasper Sells on Tuesday. Patient stated she cannot wait, informed patient that our office is not a place to go for emergencies. Patient stated she is aware, but she needs to see someone. Patient has been out of the hospital for 2 days and has been in and out of the ER several times in the last couple of months. Made patient an appointment with Dr. Tamala Julian, DOD, for evaluation today. Patient agreed to plan.

## 2022-08-12 NOTE — Progress Notes (Signed)
An USGPIV (ultrasound guided PIV) has been placed for short-term vasopressor infusion. A correctly placed ivWatch must be used when administering Vasopressors. Should this treatment be needed beyond 72 hours, central line access should be obtained.  It will be the responsibility of the bedside nurse to follow best practice to prevent extravasations.   ?

## 2022-08-12 NOTE — Consult Note (Addendum)
Cardiology Consultation   Patient ID: [name] MRN: [id number]; DOB: 1961/04/09  Admit date: 08/12/2022 Date of Consult: 08/12/2022  PCP:  [facility] Cardiologist:  Werner Lean, MD     Patient Profile:   [name] is a 61 y.o. female with a hx of valvular heart disease s/p MVR/TVR 04/2022 c/b cardiac tamponade requiring pericardiocentesis 05/2022, paroxysmal atrial fibrillation, tobacco use, GI bleed with angiodysplasia status post polyp removal who is being seen 08/12/2022 for the evaluation of hypotension at the request of Dr. Sherry Ruffing.  History of Present Illness:   [name] is a 61 year old female with past medical history noted above.  She underwent mitral valve and tricuspid valve repairs on 04/19/2022 and presented back to the hospital shortly thereafter with large pericardial effusion with tamponade requiring emergent pericardiocentesis on 05/10/2022.  She was treated with colchicine for residual chest pain.  She was readmitted later after a fall and found to have new onset atrial flutter.  Converted to sinus rhythm after increasing her Cardizem.  Repeat echocardiogram during that admission showed no evidence of recurrent pericardial effusion however showed new RV dysfunction with moderately elevated PASP of 54 mmHg.  PE study was negative but showed pulmonary edema and pleural effusions.  She was diuresed.  Admitted 2 days later for acute metabolic encephalopathy secondary to multiple sedating agents with worsening AKI.  MRI of the brain was negative.  Seen again when admitted 07/22/2022 for dizziness and increasing weakness.  She was found to have complete heart block in the setting of hyperkalemia as well as beta-blocker and calcium channel blocker use.  Heart rate improved with washout and correction of hyperkalemia.  It was recommended that she be continued on metoprolol tartrate 50 mg twice daily with her  diltiazem stopped.  Continued on Coumadin, atorvastatin and furosemide.  Presented back 2 days later with recurrent syncope.  Hemoglobin is noted to be 7.5 and she was transfused 1 unit PRBCs as well as 1 L of fluid with improved blood pressure.  She underwent TEE showing normal appearance of mitral valve annuloplasty repair with mild vegetation, and mildly increased gradients.  Moderate to severe tricuspid insufficiency despite annuloplasty repair.  Underwent colonoscopy with findings of 3 polyps with 2 nonbleeding colonic angiodysplasias treated with APC.  She was cleared to resume her Coumadin 3 days afterwards.  Cortisol 6.5>> 7.5.  Creatinine was noted at 1.79 on discharge.  She was still taking oxycodone 15 mg as well as trazodone 300 mg daily.  She was discharged home on home O2.  Again admitted 8/31 with complaints of shortness of breath and chest pain.  She was found to be hypoxic with O2 sats in the 80s on 2 L nasal cannula.  She required BiPAP.  She was found to have a hemoglobin 7.7 and received 1 unit PRBCs.  GI felt her hematochezia was secondary to her recent polypectomy from colonoscopy.  She was resumed on Lasix 20 mg daily as well as Aldactone and metoprolol.  It was recommended that she wear an ZIO AT monitor.   She called the office 9/7 complaining of recurrent dizziness and syncope.  She was seen in the office by Dr. Tamala Julian and noted to have a blood pressure of 70/40.  Reported she had not taken any of her morning medications.  She was referred to the ED for further evaluation.  In the ED her labs showed sodium 132, potassium 4.2, creatinine 4.07, BNP 555, WBC  10.4, hemoglobin 8.6.  Chest x-ray with no edema.  EKG showed sinus bradycardia, 56 bpm, T wave inversion in lateral leads.  Blood pressures noted to be in the 53-97 range systolic while in the ED.  Noted to be hypoxic with sats in the 70s.  He was started on IV fluid resuscitation but BPs minimally responsive presently.  At the  time of interview patient reports she has chronic pain.  States she has been intermittently lightheaded since being discharged, reports several episodes of near syncope and 1 episode of syncope.  While talking with the patient she adamantly requests her home narcotics.  Questioning why her pain is not being treated and after explanation regarding her blood pressure 60/40.   Past Medical History:  Diagnosis Date   Acute pericardial effusion 05/17/2022   AKI (acute kidney injury) ([place]) 05/17/2022   Anxiety    Arthritis    Cervicalgia    CHF (congestive heart failure) (HCC)    Chondromalacia of patella    Chronic neck pain    Chronic pain syndrome    Depression    secondary to loss of her son at age 33   Diabetic neuropathy ([place])    DMII (diabetes mellitus, type 2) ([place])    Dysrhythmia    Enthesopathy of hip region    Fibromyalgia    GERD (gastroesophageal reflux disease)    Hep C w/o coma, chronic ([place])    Hepatitis C    diagnosed 2005   Hypertension    Insomnia    Low back pain    Lumbago    Mitral regurgitation    Obesity    Pain in joint, upper arm    Primary localized osteoarthrosis, lower leg    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   Substance abuse ([place])    sober since 2001   Tobacco abuse    Tricuspid regurgitation    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    Past Surgical History:  Procedure Laterality Date   ABDOMINAL HYSTERECTOMY     at age 61, unknown reasons   CARPAL TUNNEL RELEASE  12/07/2011   left side   COLONOSCOPY WITH PROPOFOL N/A 07/30/2022   Procedure: COLONOSCOPY WITH PROPOFOL;  Surgeon: Ladene Artist, MD;  Location: [place];  Service: Gastroenterology;  Laterality: N/A;   EYE SURGERY     FOOT SURGERY Bilateral    HOT HEMOSTASIS N/A 07/30/2022   Procedure: HOT HEMOSTASIS (ARGON PLASMA COAGULATION/BICAP);  Surgeon: Ladene Artist, MD;  Location: [place];  Service: Gastroenterology;  Laterality: N/A;   MITRAL VALVE  REPAIR N/A 04/19/2022   Procedure: MITRAL VALVE REPAIR WITH 30MM PHYSIO II ANNULOPLASTY RING;  Surgeon: Melrose Nakayama, MD;  Location: [facility];  Service: Open Heart Surgery;  Laterality: N/A;   PERICARDIOCENTESIS N/A 05/10/2022   Procedure: PERICARDIOCENTESIS;  Surgeon: Jettie Booze, MD;  Location: [facility];  Service: Cardiovascular;  Laterality: N/A;   POLYPECTOMY  07/30/2022   Procedure: POLYPECTOMY;  Surgeon: Ladene Artist, MD;  Location: [facility] ENDOSCOPY;  Service: Gastroenterology;;   RIGHT/LEFT HEART CATH AND CORONARY ANGIOGRAPHY N/A 02/12/2022   Procedure: RIGHT/LEFT HEART CATH AND CORONARY ANGIOGRAPHY;  Surgeon: Belva Crome, MD;  Location: [facility];  Service: Cardiovascular;  Laterality: N/A;   TEE WITHOUT CARDIOVERSION N/A 02/03/2022   Procedure: TRANSESOPHAGEAL ECHOCARDIOGRAM (TEE);  Surgeon: Josue Hector, MD;  Location[facility] ENDOSCOPY;  Service: Cardiovascular;  Laterality: N/A;   TEE WITHOUT CARDIOVERSION N/A 04/19/2022  Procedure: TRANSESOPHAGEAL ECHOCARDIOGRAM (TEE);  Surgeon: Melrose Nakayama, MD;  Location: [place];  Service: Open Heart Surgery;  Laterality: N/A;   TEE WITHOUT CARDIOVERSION N/A 07/28/2022   Procedure: TRANSESOPHAGEAL ECHOCARDIOGRAM (TEE);  Surgeon: Sanda Klein, MD;  Location: [place];  Service: Cardiovascular;  Laterality: N/A;   TRICUSPID VALVE REPLACEMENT N/A 04/19/2022   Procedure: TRICUSPID VALVE REPAIR WITH 32MM MC3 ANNULOPLASTY RING;  Surgeon: Melrose Nakayama, MD;  Location: [place];  Service: Open Heart Surgery;  Laterality: N/A;     Home Medications:  Prior to Admission medications   Medication Sig Start Date End Date Taking? Authorizing Provider  acetaminophen (TYLENOL) 500 MG tablet Take 1,000 mg by mouth every 6 (six) hours as needed for mild pain.    [provider]  albuterol (PROVENTIL HFA;VENTOLIN HFA) 108 (90 Base) MCG/ACT inhaler Inhale 2 puffs into the lungs every 6 (six) hours as needed  for wheezing or shortness of breath.     [provider]  ARIPiprazole (ABILIFY) 5 MG tablet Take 5 mg by mouth daily. 07/20/22   [provider]  atorvastatin (LIPITOR) 20 MG tablet Take 20 mg by mouth daily.    [provider]  buPROPion (WELLBUTRIN XL) 300 MG 24 hr tablet Take 300 mg by mouth in the morning. 03/24/22   [provider]  dexlansoprazole (DEXILANT) 60 MG capsule Take 1 capsule by mouth daily. 07/26/22   [provider]  escitalopram (LEXAPRO) 10 MG tablet Take 10 mg by mouth every morning. 07/08/22   [provider]  FARXIGA 10 MG TABS tablet Take 10 mg by mouth daily. 08/10/22   [provider]  furosemide (LASIX) 20 MG tablet Take 20 mg by mouth daily. Pt takes 1/2 tablet 10 mg once daily.    [provider]  gabapentin (NEURONTIN) 600 MG tablet Take 0.5 tablets (300 mg total) by mouth 3 (three) times daily. 05/30/22   [name], [name], MD  losartan (COZAAR) 100 MG tablet Take 1 tablet (100 mg total) by mouth daily. Hold until you see your physician 08/04/22   Lorella Nimrod, MD  naloxone [facility]) nasal spray 4 mg/0.1 mL Place 1 spray into the nose as needed (accidental overdose).    [provider]  neomycin-polymyxin b-dexamethasone (MAXITROL) 3.5-10000-0.1 SUSP Place 1 drop into both eyes daily at 12 noon. 08/11/22   [provider]  nicotine (NICODERM CQ - DOSED IN MG/24 HOURS) 14 mg/24hr patch Place 1 patch (14 mg total) onto the skin daily. 08/10/22   Mercy Riding, MD  oxyCODONE (ROXICODONE) 15 MG immediate release tablet Take 0.5 tablets (7.5 mg total) by mouth every 12 (twelve) hours as needed for pain. 08/04/22   Lorella Nimrod, MD  PREMARIN 0.45 MG tablet Take 0.45 mg by mouth daily. 01/11/22   [provider]  spironolactone (ALDACTONE) 25 MG tablet Take 12.5 mg by mouth daily. 07/26/22   [provider]  traZODone (DESYREL) 100 MG tablet Take 1 tablet (100 mg total) by mouth at  bedtime as needed for sleep. 08/04/22   Lorella Nimrod, MD  warfarin (COUMADIN) 5 MG tablet TAKE 1 TABLET BY MOUTH DAILY OR AS DIRECTED BY ANTICOAGULATION CLINIC. Patient taking differently: Take 5 mg by mouth daily. Take 1 tablet by mouth daily and 1.5 on sundays 07/02/22   Werner Lean, MD    Inpatient Medications: Scheduled Meds:  Continuous Infusions:  sodium chloride     PRN Meds:   Allergies:    Allergies  Allergen Reactions   Norco [Hydrocodone-Acetaminophen] Itching and Other (See Comments)    Tolerates Oxycodone    Social History:   Social History   Socioeconomic History   Marital status: Single    Spouse name: Not on file   Number of children: 2   Years of education: Not on file   Highest education level: Not on file  Occupational History   Occupation: CNA    Comment: worked for 15 years   Occupation: disability  Tobacco Use   Smoking status: Every Day    Packs/day: 0.25    Years: 35.00    Total pack years: 8.75    Types: Cigarettes   Smokeless tobacco: Never   Tobacco comments:    4 cigarettes a day  Vaping Use   Vaping Use: Some days  Substance and Sexual Activity   Alcohol use: No   Drug use: No   Sexual activity: Not Currently    Comment: Celebate since 2000  Other Topics Concern   Not on file  Social History Narrative   Born and raised by mom until mom died when she was 14yo. After that she stayed with her aunt. Dad was married to someone else and was never around.  Pt has 1 brother and 1 sister and pt is the middle child. Pt had a very rough childhood. Never married. 2 kids one son died several  yrs ago and other son has Schizophrenia. Pt has graduated HS and has some college. Pt is on disability and is unemployed. 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       Financial assistance approved for 100% discount at [facility] and has [facility] card   [employer]  September 29, 2010 2:27 PM   Social Determinants of Health   Financial Resource Strain:  Not on file  Food Insecurity: Not on file  Transportation Needs: Not on file  Physical Activity: Not on file  Stress: Not on file  Social Connections: Not on file  Intimate Partner Violence: Not on file    Family History:    Family History  Problem Relation Age of Onset   Uterine cancer Mother    Alcohol abuse Father    Alcohol abuse Sister    Drug abuse Sister    Drug abuse Brother    Alcohol abuse Brother    Schizophrenia Son    Diabetes Other    Arthritis Other    Hypertension Other    Heart attack Son 97       Died suddenly     ROS:  Please see the history of present illness.   All other ROS reviewed and negative.     Physical Exam/Data:   Vitals:   08/12/22 1300 08/12/22 1315 08/12/22 1330 08/12/22 1345  BP: (!) 70/42 (!) 76/47 (!) 76/53 105/73  Pulse: (!) 56 (!) 44 (!) 57 62  Resp: '15 14 18 19  '$ SpO2: (!) 86% 95% 95% 93%   No intake or output data in the 24 hours ending 08/12/22 1511    08/12/2022   11:22 AM 08/09/2022    4:15 AM 08/08/2022    4:26 AM  Last 3 Weights  Weight (lbs) 210 lb 194 lb 11.2 oz 198 lb 12.8 oz  Weight (kg) 95.255 kg 88.315 kg 90.175 kg     There is no height or weight on file to calculate BMI.  General: Ill appearing female sitting up in bed HEENT: normal Neck: + JVD at 45 degrees Vascular:  No carotid bruits; Distal pulses 2+ bilaterally Cardiac:  normal S1, S2; RRR; + systolic murmur  Lungs:  clear to auscultation bilaterally, no wheezing, rhonchi or rales  Abd: soft, nontender, no hepatomegaly  Ext: no edema Musculoskeletal:  No deformities, BUE and BLE strength normal and equal Skin: warm and dry  Neuro:  CNs 2-12 intact, no focal abnormalities noted Psych: Somewhat blunted affect, delayed response to answering questions.  Alert and oriented  EKG:  The EKG was personally reviewed and demonstrates:  sinus bradycardia, 56 bpm, T wave inversion in lateral leads   Relevant CV Studies:  TEE: 07/28/2022  IMPRESSIONS     1.  Left ventricular ejection fraction, by estimation, is 60 to 65%. The  left ventricle has normal function. The left ventricle has no regional  wall motion abnormalities. There is mild concentric left ventricular  hypertrophy. Left ventricular diastolic  function could not be evaluated.   2. Right ventricular systolic function is normal. The right ventricular  size is normal. There is moderately elevated pulmonary artery systolic  pressure. The estimated right ventricular systolic pressure is 08.6 mmHg.   3. Left atrial size was severely dilated. No left atrial/left atrial  appendage thrombus was detected. The LAA emptying velocity was 70 cm/s.   4. Right atrial size was mildly dilated.   5. The mitral valve has been repaired/replaced. Mild mitral valve  regurgitation. Mild mitral stenosis. The mean mitral valve gradient is 6.6  mmHg with average heart rate of 83 bpm. There is a prosthetic annuloplasty  ring present in the mitral position.  Procedure Date: 04/19/22.   6. The tricuspid valve is has been repaired/replaced. The tricuspid valve  is status post repair with an annuloplasty ring. Tricuspid valve  regurgitation is moderate.   7. The aortic valve is tricuspid. Aortic valve regurgitation is not  visualized. No aortic stenosis is present.   Comparison(s): A "double envelope" or "ghosting" artifact is seen during  CW interrogation of the mitral inflow. This may have led to overestimation  of the transmitral gradient on the previous study.   FINDINGS   Left Ventricle: Left ventricular ejection fraction, by estimation, is 60  to 65%. The left ventricle has normal function. The left ventricle has no  regional wall motion abnormalities. The left ventricular internal cavity  size was normal in size. There is   mild concentric left ventricular hypertrophy. Left ventricular diastolic  function could not be evaluated due to mitral valve repair. Left  ventricular diastolic function could  not be evaluated.   Right Ventricle: The right ventricular size is normal. No increase in  right ventricular wall thickness. Right ventricular systolic function is  normal. There is moderately elevated pulmonary artery systolic pressure.  The tricuspid regurgitant velocity is  3.52 m/s, and with an assumed right atrial pressure of 5 mmHg, the  estimated right ventricular systolic pressure is 76.1 mmHg.   Left Atrium: Left atrial size was severely dilated. No left atrial/left  atrial appendage thrombus was detected. The LAA emptying velocity was 70  cm/s.   Right Atrium: Right atrial size was mildly dilated.   Pericardium: There is no evidence of pericardial effusion.   Mitral Valve: The mitral valve has been repaired/replaced. Mild mitral  valve regurgitation, with centrally-directed jet. There is a prosthetic  annuloplasty ring present in the mitral position. Procedure Date: 04/19/22.  Mild mitral valve stenosis. The mean   mitral valve gradient is 6.6 mmHg with average heart rate of 83 bpm.  Tricuspid Valve: The tricuspid valve is has been repaired/replaced.  Tricuspid valve regurgitation is moderate. The tricuspid valve is status  post repair with an annuloplasty ring.   Aortic Valve: The aortic valve is tricuspid. Aortic valve regurgitation is  not visualized. No aortic stenosis is present.   Pulmonic Valve: The pulmonic valve was normal in structure. Pulmonic valve  regurgitation is not visualized.   Aorta: The aortic root, ascending aorta, aortic arch and descending aorta  are all structurally normal, with no evidence of dilitation or  obstruction. There is minimal (Grade I) plaque.   IAS/Shunts: No atrial level shunt detected by color flow Doppler.   Cath: 02/2022  CONCLUSIONS: Mild pulmonary hypertension with mean PA pressure 31 mmHg.  Mean pulmonary wedge pressure 23 mmHg with a V wave 41 mmHg.  WHO group 2 pulmonary hypertension. Pulmonary vascular resistance 0.99  [place]s units Left ventricular end diastolic pressure 35 mmHg, consistent with diastolic heart failure. Right dominant coronary anatomy.  Widely patent coronaries without obstructive disease. Previously documented severe mitral regurgitation.   RECOMMENDATIONS:   Per Dr. Glenford Bayley who is making plans for referral to CV surgeon. Home later today if no complications. Resume all home medications including p.m. doses of antihypertensive agents.  She did not take any of her medicine this morning and had elevated blood pressure requiring IV hydralazine and labetalol.  Laboratory Data:  High Sensitivity Troponin:   Recent Labs  Lab 08/03/22 0339 08/05/22 1130 08/05/22 1430 08/05/22 1812 08/05/22 2038  TROPONINIHS 49* 69* 143* 191* 190*     Chemistry Recent Labs  Lab 08/08/22 0540 08/09/22 0730 08/10/22 0231  NA 138 137 140  K 4.1 3.8 3.8  CL 107 106 103  CO2 '25 23 27  '$ GLUCOSE 99 93 97  BUN '17 13 10  '$ CREATININE 1.08* 1.06* 0.99  CALCIUM 8.0* 8.3* 8.8*  MG 1.5* 1.7 1.8  GFRNONAA 58* 59* >60  ANIONGAP '6 8 10    '$ Recent Labs  Lab 08/08/22 0540 08/09/22 0730 08/10/22 0231  ALBUMIN 2.9* 3.1* 3.4*   Lipids No results for input(s): "CHOL", "TRIG", "HDL", "LABVLDL", "LDLCALC", "CHOLHDL" in the last 168 hours.  Hematology Recent Labs  Lab 08/09/22 0730 08/10/22 0231 08/12/22 1352  WBC 4.8 5.1 10.4  RBC 3.22* 3.43* 3.02*  HGB 9.3* 9.9* 8.6*  HCT 28.2* 29.7* 27.8*  MCV 87.6 86.6 92.1  MCH 28.9 28.9 28.5  MCHC 33.0 33.3 30.9  RDW 16.2* 15.9* 15.9*  PLT 198 204 194   Thyroid No results for input(s): "TSH", "FREET4" in the last 168 hours.  BNP Recent Labs  Lab 08/07/22 0658 08/10/22 0231  BNP 526.5* 713.2*    DDimer No results for input(s): "DDIMER" in the last 168 hours.   Radiology/Studies:  DG Chest 2 View  Result Date: 08/12/2022 CLINICAL DATA:  Shortness of breath and lightheaded. EXAM: CHEST - 2 VIEW COMPARISON:  08/06/2022 FINDINGS: The cardiac  silhouette, mediastinal and hilar contours are within normal limits and stable. 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 The lungs are clear of an acute process. No infiltrates, edema or effusions. No pulmonary lesions. The bony thorax is intact. IMPRESSION: No acute cardiopulmonary findings. Electronically Signed   By: Marijo Sanes M.D.   On: 08/12/2022 14:22     Assessment and Plan:   [name] is a 61 y.o. 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 Sherry Ruffing.  Hypotension Hypovolemic Shock -- etiology is unclear, she reports compliance with home medications but held meds this morning as she felt poorly. BP 70/40 in the office. In review of medication list she thinks she may have been taking losartan, lasix and spiro? -- receiving IVF resuscitation in the ED but blood pressures have been minimally responsive -- holding all BP meds -- Blood cultures pending -- check lactic acid -- PCCM consulted by the EDP, ordered for levophed -- repeat echo to rule pericardial effusion  Paroxysmal atrial fibrillation -- Currently in sinus rhythm, hemoglobin stable at 8.6 -- INR pending  Valvular heart disease status post MVR/TVR --Recent TEE 8/23 with LVEF of 60 to 65%, normal RV size and function, moderately elevated pulmonary artery pressure, left atrium severely dilated, mitral valve repair with mild MR, tricuspid valve repair with moderate TR -- check echo as above   AKI -- Cr 0.99 at discharge 9/5, now up to 4.07 -- suspect in the setting of hypovolemia and hypotension   -- receiving IVF resuscitation   Recent GIB -- recent admission requiring 1 unit PRBCs, with improvement to 9.9 at  discharge. Hgb 8.6 on admission  Chronic pain  -- takes oxycodone '15mg'$  BID along with trazodone '100mg'$  QHS -- requesting pain meds at the time of interview   Risk Assessment/Risk Scores:   CHA2DS2-VASc Score = 4   This indicates a 4.8% annual risk of stroke. The patient's score is based upon: CHF History: 1 HTN History: 1 Diabetes History: 1 Stroke History: 0 Vascular Disease History: 0 Age Score: 0 Gender Score: 1   For questions or updates, please contact [place] Please consult [email or web address] for contact info under    Signed, Reino Bellis, NP  08/12/2022 3:11 PM  Personally seen and examined. Agree with above.  Immediately upon entering the room she did asked me for some more pain medication.  She was sitting up in bed.  Had pizza sticks in box beside bed.  Overall appears more lucid than described previously.  Blood pressure has increased with IV fluids.  Heart regular rate and rhythm, no rubs  Labs remarkable for creatinine of 4.  Was 0.9 on 08/10/2022, 2 days ago.  BNP 555 today was 713 on discharge 2 days ago.  INR 1.2  Hemoglobin slightly lower 8.6 from 75.1  61 year old here with hypotension status post MVR, TVR with prior pericardial effusion  Hypotension - Secondary to hypovolemic shock.  Agree with IV fluid resuscitation.  Seems to be responding. -Hold all diuretics -Hold Cozaar  Acute kidney injury - Hold diuretics, hold ARB -Secondary to hypoperfusion from hypovolemic shock -Avoid NSAIDs/colchicine at this time  Chronic pain - Insisting upon more pain medication.  Perhaps this is playing a role in her hypotension and current state as well.  MVR/TVR - Prior TEE reviewed.  No real significant mitral valve gradient, 5 mmHg.  Compatible with prior repair.  Secondary pulmonary hypertension - Noted on echocardiogram.  Mean PA pressure was 31 mmHg demonstrative of mild pulmonary hypertension from March 2023.  Had right heart catheterization  then.  Candee Furbish, MD

## 2022-08-12 NOTE — Progress Notes (Signed)
Pharmacy Antibiotic Note  [name] is a 61 y.o. female admitted on 08/12/2022 with sepsis.  Pharmacy has been consulted for Unasyn dosing.  AKI with Scr of 4.07  Plan: Unasyn 3 gms IV q24hr Monitor renal function, clinical Status and C&S     Temp (24hrs), Avg:98.6 F (37 C), Min:98.6 F (37 C), Max:98.6 F (37 C)  Recent Labs  Lab 08/05/22 1812 08/05/22 2339 08/07/22 0705 08/07/22 0708 08/08/22 0540 08/09/22 0730 08/10/22 0231 08/12/22 1352  WBC  --    < > 5.7  --  5.4 4.8 5.1 10.4  CREATININE  --    < >  --  1.38* 1.08* 1.06* 0.99 4.07*  LATICACIDVEN 1.1  --   --   --   --   --   --  2.3*   < > = values in this interval not displayed.    Estimated Creatinine Clearance: 18.2 mL/min (A) (by C-G formula based on SCr of 4.07 mg/dL (H)).    Allergies  Allergen Reactions   Norco [Hydrocodone-Acetaminophen] Itching and Other (See Comments)    Tolerates Oxycodone    Antimicrobials this admission: Unasyn 9/7 >>   Thank you for allowing pharmacy to be a part of this patient's care.  Alanda Slim, PharmD, [facility] Clinical Pharmacist Please see AMION for all Pharmacists' Contact Phone Numbers 08/12/2022, 5:24 PM

## 2022-08-12 NOTE — ED Triage Notes (Signed)
Pt BIB via EMS. Per EMS pt had low BP at doctors office following starting a new diuretic. Pt alert upon arrival.

## 2022-08-12 NOTE — H&P (Addendum)
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 She was admitted multiple times in August for symptomatic bradycardia and hypotension.  During her admission on 07/24/2022 it was thought her symptomatic bradycardia was from taking extra doses of diltiazem.  She was then readmitted a few days later with dizziness and lightheadedness with possible syncope secondary to bradycardia versus overdose of oxycodone in setting of her worsening renal function. She was then readmitted for severe dehydration from poor PO intake and continuing to take her diuretic therapy. During these episodes she did not require an ICU admission as she responded well to fluid resuscitation.  History of present illness   [name] was sent to the ED by her cardiologist Dr. Tamala Julian after being found to be hypotensive.  She reports that she felt like she was going to pass out 6-7 times over the past day.  She becomes lightheaded with standing and her knee started to buckle.  She denies taking any of her medications today.  She notes that this has been happening regularly since May and that she feels very sleepy and everything hurts.  She has taken her oxycodone last dose was at 4 AM this morning.  She denies chest pain confusion or melena.  Past  Medical History  Paroxysmal atrial fibrillation Type 2 diabetes Hypertension Mitral valve and tricuspid valve repair Pericardial fusion Hematochezia secondary to nonbleeding colonic angiodysplasia History of metabolic encephalopathy secondary to polypharmacy  Consults:  Cardiology  Procedures:  None  Significant Diagnostic Tests:  Chest x-ray-no acute cardiopulmonary findings  Micro Data:  Blood cultures x2 pending Urine culture pending  Objective   Blood pressure 105/73, pulse 62, resp. rate 19, last menstrual period 12/06/1976, SpO2 93 %.       No intake or output data in the 24 hours ending 08/12/22 1605 There were no vitals filed for this visit.  Examination: General: Agitated HENT: Normocephalic atraumatic, pupils restrictive. Mucous membranes dry.  Lungs: Clear to auscultation bilaterally Cardiovascular: Regular rate and rhythm, 2/6 holosystolic systolic murmur best heard over left second intercostal space Abdomen: Soft nontender Extremities: Warm and dry Neuro: Alert and oriented x3 Psych: Somnolent  Assessment & Plan:   Shock Lactatemia  Unclear etiology for her shock, likely multifactorial with hypovolemic and polypharmacy. She has had multiple hospitalizations in the past three months for similar presentation. Each time seems to have a different etiology; her taking extra doses of her av nodal blocking agents, continuing to take her diuretics despite poor PO intake, and at one point was having some GI bleeding. Unable to obtain CTA to assess for pulmonary embolism with renal function worsening, she endorses adherence to her warfarin. Will await echocardiogram results.  -start levophed, titrate off if able -received IV bolus in ED, start LR 75 cc/hr for 8 hours and encourage PO intake -echocardiogram to assess for cardiogenic component  -hold av nodal blocking agents  -am  cortisol tomorrow -BC/UC pending, start unasyn day 1 -trend lactate -PT/OT and  orthostatics  Bradycardia Heart rate has ranged from 50s to 60s.  Has been bradycardic in the past with heart rate in the 20s that was secondary to taking extra doses of her AV nodal blocking agents.  -hold av nodal blocking agents -mag and phos pending -troponins pending -echo pending -cardiac monitoring  Hx of pericardial effusion status post pericardiocentesis MVR/TVR Bedside echocardiogram performed without evidence of effusion.  Formal echocardiogram ordered.  Low suspicion that tamponade is etiology for her bradycardia and hypotension.  -Follow-up echocardiogram -Heparin and transition to warfarin when stabilizes -INR levels pending  Acute kidney failure  Hyponatremia likely hypovolemic Most recent Cr of around 1.0. Cr today of 4.07. UA pending. Unclear if oliguric. Likely pre-renal in setting of shock and hypovolemia. Continue to treat shock as per above and hopeful renal function improves.  -UA pending -Continue management of shock -Renal US pending -strict I/o  Chronic pain syndrome Per PDMP regimen of oxycodone, gabapentin, and trazodone. AKI and centrally acting medications likely etiology for her somnolence. Will hold renally excreted pain medications, can start tylenol and if needed, PRN dilaudid for pain until renal function improves.  -tylenol 1000 mg q6h -hold oxycodone, tramadol, and gabapentin -dilaudid IV PRN  Normocytic anemia CBC of 8.6. Recent admission for lower GI bleed with multiple polyps and angiodysplasia found. Denies any bloody stools since.  -trend hgb daily -if worsening, can workup further with smear, retic count, and iron studies  Anxiety and depression Restart Abilify, Wellbutrin, and lexapro. Will need to monitor Qtc daily.   HFpEF Recent echocardiogram with EF of 60-65%.  Echocardiogram pending. Hold diuretics with hypovolemia. BNP 555, similar to levels in the past.  -monitor volume status daily -strict I/o  Best practice:  Diet:  Heart healthy Pain/Anxiety/Delirium protocol (if indicated): tylenol, dilaudid DVT prophylaxis: heparin until stabilizes, transition back to home warfarin GI prophylaxis: protonix Glucose control: SSI Mobility: PT Code Status: Full Labs   CBC: Recent Labs  Lab 08/07/22 0705 08/07/22 1250 08/08/22 0540 08/08/22 1802 08/09/22 0730 08/10/22 0231 08/12/22 1352  WBC 5.7  --  5.4  --  4.8 5.1 10.4  NEUTROABS  --   --   --   --   --   --  7.8*  HGB 8.0*   < > 7.7* 10.2* 9.3* 9.9* 8.6*  HCT 24.0*   < > 23.6* 30.2* 28.2* 29.7* 27.8*  MCV 87.3  --  88.1  --  87.6 86.6 92.1  PLT 198  --  197  --  198 204 194   < > = values in this interval not displayed.    Basic Metabolic Panel: Recent Labs  Lab 08/05/22 2339 08/06/22 0940 08/07/22 0708 08/08/22 0540 08/09/22 0730 08/10/22 0231 08/12/22 1352  NA 138   < > 137 138 137 140 132*  K 4.2   < > 3.9 4.1 3.8 3.8 4.2  CL 105   < > 106 107 106 103 102  CO2 19*   < > '24 25 23 27 22  '$ GLUCOSE 127*   < > 86 99 93 97 108*  BUN 15   < > '22 17 13 10 21  '$ CREATININE 1.13*   < > 1.38* 1.08* 1.06* 0.99 4.07*  CALCIUM 8.7*   < > 8.0* 8.0* 8.3* 8.8* 7.4*  MG 1.7  --  1.5* 1.5* 1.7 1.8  --   PHOS  --   --   --  3.3 3.2 3.4  --    < > = values in this interval not displayed.   GFR: Estimated Creatinine Clearance: 18.2 mL/min (A) (by C-G formula based on SCr of 4.07 mg/dL (H)). Recent Labs  Lab 08/05/22 1812 08/05/22 2339 08/08/22 0540 08/09/22 0730 08/10/22 0231 08/12/22 1352  WBC  --    < > 5.4 4.8 5.1 10.4  LATICACIDVEN 1.1  --   --   --   --   --    < > = values in this interval not displayed.    Liver Function Tests: Recent Labs  Lab 08/08/22 0540 08/09/22 0730 08/10/22 0231 08/12/22 1352  AST  --   --   --  19  ALT  --   --   --  11  ALKPHOS  --   --   --  57  BILITOT  --   --   --  0.4  PROT  --   --   --  6.1*  ALBUMIN 2.9* 3.1* 3.4* 3.6   No results for input(s): "LIPASE", "AMYLASE" in the last 168 hours. No  results for input(s): "AMMONIA" in the last 168 hours.  ABG    Component Value Date/Time   PHART 7.319 (L) 04/19/2022 2202   PCO2ART 40.7 04/19/2022 2202   PO2ART 152 (H) 04/19/2022 2202   HCO3 24.7 08/05/2022 1821   TCO2 19 (L) 07/26/2022 1149   ACIDBASEDEF 5.0 (H) 04/19/2022 2202   O2SAT 99 08/05/2022 1821     Coagulation Profile: Recent Labs  Lab 08/06/22 0940 08/07/22 0708 08/08/22 0540 08/09/22 0222 08/10/22 0231  INR 1.7* 1.7* 1.6* 1.5* 1.2    Cardiac Enzymes: Recent Labs  Lab 08/07/22 0708  CKTOTAL 29*    HbA1C: Hgb A1c MFr Bld  Date/Time Value Ref Range Status  07/22/2022 11:58 PM 6.0 (H) 4.8 - 5.6 % Corrected    Comment:    (NOTE)         Prediabetes: 5.7 - 6.4         Diabetes: >6.4         Glycemic control for adults with diabetes: <7.0 CORRECTED ON 08/18 AT 2235: PREVIOUSLY REPORTED AS 5.4   04/15/2022 10:32 AM 6.1 (H) 4.8 - 5.6 % Final    Comment:    (NOTE) Pre diabetes:          5.7%-6.4%  Diabetes:              >6.4%  Glycemic control for   <7.0% adults with diabetes     CBG: Recent Labs  Lab 08/07/22 2245 08/08/22 0635 08/08/22 1059 08/08/22 1629 08/12/22 [place] [facility] Resident PGY-3 [facility]

## 2022-08-12 NOTE — Progress Notes (Signed)
ANTICOAGULATION CONSULT NOTE - Follow Up Consult  Pharmacy Consult for Heparin Indication: atrial fibrillation  Allergies  Allergen Reactions   Norco [Hydrocodone-Acetaminophen] Itching and Other (See Comments)    Tolerates Oxycodone    Patient Measurements:   Heparin Dosing Weight: 90.5 kg  Vital Signs: Temp: 98.6 F (37 C) (09/07 1650) Temp Source: Oral (09/07 1650) BP: 83/43 (09/07 1650) Pulse Rate: 62 (09/07 1650)  Labs: Recent Labs    08/10/22 0231 08/12/22 1352  HGB 9.9* 8.6*  HCT 29.7* 27.8*  PLT 204 194  LABPROT 15.2  --   INR 1.2  --   CREATININE 0.99 4.07*    Estimated Creatinine Clearance: 18.2 mL/min (A) (by C-G formula based on SCr of 4.07 mg/dL (H)).   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  Admitted today due to hypotension and AKI.  Pharmacy consulted to continue [facility] with heparin. INR on 9/7 was 1.7  PTA -Warfarin 5 mg daily except 7.5 mg on Sundays. Goal of Therapy:  Heparin level 0.3-0.7 units/ml Monitor platelets by anticoagulation protocol: Yes   Plan:  Start heparin infusion at 1300 units/hr Check anti-Xa level in 8 hours and daily while on heparin Continue to monitor H&H and platelets  Alanda Slim, PharmD, [facility] Clinical Pharmacist Please see AMION for all Pharmacists' Contact Phone Numbers 08/12/2022, 7:25 PM

## 2022-08-12 NOTE — Progress Notes (Signed)
[place] Progress Note Patient Name: [name] DOB: [birth year] MRN: [id number]   Date of Service  08/12/2022  HPI/Events of Note  Patient requests home Trazodone and Oxycodone.  eICU Interventions  Plan: Trazodone 100 mg PO Q HS PRN sleep. Oxycodone IR 5 mg PO Q 8 hours PRN moderate or severe pain. Gabapentin 300 mg PO now and BID.     Intervention Category Major Interventions: Other:  Lysle Dingwall 08/12/2022, 9:21 PM

## 2022-08-12 NOTE — Telephone Encounter (Signed)
Pt c/o Syncope: STAT if syncope occurred within 30 minutes and pt complains of lightheadedness High Priority if episode of passing out, completely, today or in last 24 hours   Did you pass out today? Yes, yesterday   When is the last time you passed out? Yes   Has this occurred multiple times? 6 times   Did you have any symptoms prior to passing out? She states she feels lightheaded and her knees get weak before it happens.

## 2022-08-13 ENCOUNTER — Inpatient Hospital Stay (HOSPITAL_COMMUNITY): Payer: Medicare Other

## 2022-08-13 DIAGNOSIS — R001 Bradycardia, unspecified: Secondary | ICD-10-CM | POA: Diagnosis not present

## 2022-08-13 DIAGNOSIS — I959 Hypotension, unspecified: Secondary | ICD-10-CM | POA: Diagnosis not present

## 2022-08-13 LAB — GLUCOSE, CAPILLARY
Glucose-Capillary: 110 mg/dL — ABNORMAL HIGH (ref 70–99)
Glucose-Capillary: 113 mg/dL — ABNORMAL HIGH (ref 70–99)

## 2022-08-13 LAB — ECHOCARDIOGRAM COMPLETE
Area-P 1/2: 1.74 cm2
Height: 71 in
MV M vel: 4.33 m/s
MV Peak grad: 75 mmHg
MV VTI: 1.2 cm2
S' Lateral: 3.2 cm
Weight: 3262.81 oz

## 2022-08-13 LAB — BASIC METABOLIC PANEL
Anion gap: 8 (ref 5–15)
BUN: 20 mg/dL (ref 8–23)
CO2: 22 mmol/L (ref 22–32)
Calcium: 7.6 mg/dL — ABNORMAL LOW (ref 8.9–10.3)
Chloride: 103 mmol/L (ref 98–111)
Creatinine, Ser: 2.37 mg/dL — ABNORMAL HIGH (ref 0.44–1.00)
GFR, Estimated: 23 mL/min — ABNORMAL LOW (ref >60)
Glucose, Bld: 87 mg/dL (ref 70–99)
Potassium: 3.6 mmol/L (ref 3.5–5.1)
Sodium: 133 mmol/L — ABNORMAL LOW (ref 135–145)

## 2022-08-13 LAB — CBC
HCT: 26.2 % — ABNORMAL LOW (ref 36.0–46.0)
Hemoglobin: 8.6 g/dL — ABNORMAL LOW (ref 12.0–15.0)
MCH: 28.7 pg (ref 26.0–34.0)
MCHC: 32.8 g/dL (ref 30.0–36.0)
MCV: 87.3 fL (ref 80.0–100.0)
Platelets: 175 10*3/uL (ref 150–400)
RBC: 3 MIL/uL — ABNORMAL LOW (ref 3.87–5.11)
RDW: 15.3 % (ref 11.5–15.5)
WBC: 7 10*3/uL (ref 4.0–10.5)
nRBC: 0 % (ref 0.0–0.2)

## 2022-08-13 LAB — MRSA NEXT GEN BY PCR, NASAL: MRSA by PCR Next Gen: NOT DETECTED

## 2022-08-13 LAB — MAGNESIUM: Magnesium: 1.6 mg/dL — ABNORMAL LOW (ref 1.7–2.4)

## 2022-08-13 LAB — HEPARIN LEVEL (UNFRACTIONATED)
Heparin Unfractionated: 0.39 IU/mL (ref 0.30–0.70)
Heparin Unfractionated: 0.63 IU/mL (ref 0.30–0.70)

## 2022-08-13 LAB — PROCALCITONIN: Procalcitonin: <0.1 ng/mL

## 2022-08-13 LAB — CORTISOL: Cortisol, Plasma: 6.1 ug/dL

## 2022-08-13 LAB — PHOSPHORUS: Phosphorus: 4.4 mg/dL (ref 2.5–4.6)

## 2022-08-13 MED ORDER — MIDODRINE HCL 5 MG PO TABS
5.0000 mg | ORAL_TABLET | Freq: Three times a day (TID) | ORAL | Status: DC
  Administered 2022-08-13 – 2022-08-14 (×6): 5 mg via ORAL
  Filled 2022-08-13 (×6): qty 1

## 2022-08-13 MED ORDER — NICOTINE 14 MG/24HR TD PT24
14.0000 mg | MEDICATED_PATCH | Freq: Every day | TRANSDERMAL | Status: DC
  Administered 2022-08-13 – 2022-08-17 (×5): 14 mg via TRANSDERMAL
  Filled 2022-08-13 (×5): qty 1

## 2022-08-13 MED ORDER — COSYNTROPIN 0.25 MG IJ SOLR: 0.2500 mg | Freq: Once | INTRAMUSCULAR | Status: DC

## 2022-08-13 MED ORDER — SODIUM CHLORIDE 0.9 % IV SOLN
1.0000 g | INTRAVENOUS | Status: DC
  Administered 2022-08-13: 1 g via INTRAVENOUS
  Filled 2022-08-13: qty 10

## 2022-08-13 MED ORDER — CHLORHEXIDINE GLUCONATE CLOTH 2 % EX PADS
6.0000 | MEDICATED_PAD | Freq: Every day | CUTANEOUS | Status: DC
  Administered 2022-08-13 – 2022-08-17 (×4): 6 via TOPICAL

## 2022-08-13 MED ORDER — MAGNESIUM SULFATE 4 GM/100ML IV SOLN
4.0000 g | Freq: Once | INTRAVENOUS | Status: AC
  Administered 2022-08-13: 4 g via INTRAVENOUS
  Filled 2022-08-13: qty 100

## 2022-08-13 MED ORDER — OXYCODONE HCL 5 MG PO TABS
15.0000 mg | ORAL_TABLET | Freq: Four times a day (QID) | ORAL | Status: DC | PRN
  Administered 2022-08-13 – 2022-08-17 (×15): 15 mg via ORAL
  Filled 2022-08-13 (×15): qty 3

## 2022-08-13 NOTE — Evaluation (Addendum)
Occupational Therapy Evaluation and Discharge Patient Details Name: [name] MRN: [id number] DOB: 1961-10-26 Today's Date: 08/13/2022   History of Present Illness 61 year old admitted again with similar complaints of bradycardia, hypotension, AKI. PHMx: CHF, COPD, paroxysmal atrial fibrillation, mitral and tricuspid valve repair, hypertension, anxiety, depression, chronic pain syndrome, cardiac tamponade with recent hospitalization for GI bleed on 8/21.  She has had recurrent hospitalization for syncope, bradycardia which was felt to be poor p.o. intake while she is taking her cardiac and pain medications.   Clinical Impression   This 61 yo female admitted with above presents to acute OT at a Mod I with a RW for ADLs and mobility. No further OT needs, we will sign off. No PT needs identified and made PT aware.     Recommendations for follow up therapy are one component of a multi-disciplinary discharge planning process, led by the attending physician.  Recommendations may be updated based on patient status, additional functional criteria and insurance authorization.   Follow Up Recommendations  No OT follow up    Assistance Recommended at Discharge None     Functional Status Assessment  Patient has had a recent decline in their functional status and/or demonstrates limited ability to make significant improvements in function in a reasonable and predictable amount of time  Equipment Recommendations  None recommended by OT       Precautions / Restrictions Precautions Precautions: Fall Restrictions Weight Bearing Restrictions: No Other Position/Activity Restrictions: Chronic pain in back, hips and knees      Mobility Bed Mobility Overal bed mobility: Independent                  Transfers Overall transfer level: Modified independent Equipment used: Rolling walker (2 wheels)               General transfer comment: Mod I ambulation around unit with RW       Balance Overall balance assessment: Mild deficits observed, not formally tested                                         ADL either performed or assessed with clinical judgement   ADL Overall ADL's : Modified independent (sit<>stand at sink for B/D, toilet in bathroom)                                             Vision Baseline Vision/History: 0 No visual deficits Patient Visual Report: No change from baseline              Pertinent Vitals/Pain Pain Assessment Pain Assessment: 0-10 Pain Score: 7  Pain Location: generalized Pain Descriptors / Indicators: Aching, Sore Pain Intervention(s): Monitored during session     Hand Dominance Right   Extremity/Trunk Assessment Upper Extremity Assessment Upper Extremity Assessment: Overall WFL for tasks assessed RUE Deficits / Details: Limited AROM to 100 due to pain LUE Deficits / Details: limited shoulder flex to 100 due to pain           Communication Communication Communication: No difficulties   Cognition Arousal/Alertness: Awake/alert Behavior During Therapy: WFL for tasks assessed/performed Overall Cognitive Status: Within Functional Limits for tasks assessed  Home Living Family/patient expects to be discharged to:: Private residence Living Arrangements: Alone Available Help at Discharge: Available PRN/intermittently Type of Home: Apartment Home Access: Level entry     Home Layout: One level     Bathroom Shower/Tub: Teacher, early years/pre: Standard Bathroom Accessibility: Yes How Accessible: Accessible via walker Home Equipment: Rollator (4 wheels);Cane - single point;BSC/3in1;Wheelchair - manual;Grab bars - tub/shower;Shower seat;Tub bench;Hand held shower head          Prior Functioning/Environment Prior Level of Function : Independent/Modified Independent;Driving              Mobility Comments: USing rollator most of the time; uses WC when feeling dizzy ADLs Comments: sponge bathes with showers 2x/wk        OT Problem List: Impaired balance (sitting and/or standing)         OT Goals(Current goals can be found in the care plan section) Acute Rehab OT Goals Patient Stated Goal: to get her pain med regimen here         AM-PAC OT "6 Clicks" Daily Activity     Outcome Measure Help from another person eating meals?: None Help from another person taking care of personal grooming?: None Help from another person toileting, which includes using toliet, bedpan, or urinal?: None Help from another person bathing (including washing, rinsing, drying)?: None Help from another person to put on and taking off regular upper body clothing?: None Help from another person to put on and taking off regular lower body clothing?: None 6 Click Score: 24   End of Session Equipment Utilized During Treatment: Rolling walker (2 wheels) Nurse Communication:  (RN in room when patient was washing up at sink)  Activity Tolerance:   Patient left: in chair;with call bell/phone within reach  OT Visit Diagnosis: Unsteadiness on feet (R26.81) (needs to use RW)                Time: 9518-8416 OT Time Calculation (min): 36 min Charges:  OT General Charges $OT Visit: 1 Visit OT Evaluation $OT Eval Moderate Complexity: 1 Mod OT Treatments $Self Care/Home Management : 8-22 mins  [address] [address], OTR/L Acute Rehab Services [facility] [phone] Office [phone]    Almon Register 08/13/2022, 1:02 PM

## 2022-08-13 NOTE — Progress Notes (Signed)
NAME:  [name], MRN:  [id number], DOB:  December 23, 1960, LOS: 1 ADMISSION DATE:  08/12/2022, CONSULTATION DATE:  9/7 REFERRING MD:  ED, CHIEF COMPLAINT:  Near syncope   History of Present Illness:  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 She was admitted multiple times in August for symptomatic bradycardia and hypotension.  During her admission on 07/24/2022 it was thought her symptomatic bradycardia was from taking extra doses of diltiazem.  She was then readmitted a few days later with dizziness and lightheadedness with possible syncope secondary to bradycardia versus overdose of oxycodone in setting of her worsening renal function. She was then readmitted for severe dehydration from poor PO intake and continuing to take her diuretic therapy. During these episodes she did not require an ICU admission as she responded well to fluid resuscitation.  Ms [name] was sent to the ED by her cardiologist Dr. Tamala Julian after being found to be hypotensive.  She reports that she felt like she was going to pass out 6-7 times over the past day.  She becomes lightheaded with standing and her knee started to buckle.  She denies taking any of her medications today.  She notes that this has been happening regularly since May and that she feels very sleepy and everything hurts.  She has taken her oxycodone last dose was at 4 AM this morning.  She denies chest pain confusion or melena.  Pertinent  Medical History   Paroxysmal atrial fibrillation Type 2 diabetes Hypertension Mitral valve and tricuspid valve repair Pericardial fusion Hematochezia secondary to nonbleeding colonic angiodysplasia History of metabolic encephalopathy secondary to polypharmacy  Significant Hospital Events: Including procedures, antibiotic start and stop dates in addition to other pertinent events   9/7 Admitted to ICU for hypotension/shock and started on levophed gtt   Interim History / Subjective:  Patient feeling improved this morning, although does endorse 1 episode of emesis overnight. Denies any lightheadedness at this time.   Objective   Blood pressure (!) 83/55, pulse 72, temperature 98 F (36.7 C), temperature source Oral, resp. rate 17, height '5\' 11"'$  (1.803 m), weight 92.5 kg, last menstrual period 12/06/1976, SpO2 92 %.        Intake/Output Summary (Last 24 hours) at 08/13/2022 0646 Last data filed at 08/13/2022 0600 Gross per 24 hour  Intake 3370.06 ml  Output --  Net 3370.06 ml   Filed Weights   08/12/22 1700 08/13/22 0500  Weight: 93.1 kg 92.5 kg    Examination: General: Well appearing, NAD HENT: [place]/AT, eyes anicteric Lungs: CTAB, normal work of breathing on [place] Cardiovascular: RRR, +systolic murmur Abdomen: soft, nondistended, nontender, +BS Extremities: Warm and dry Neuro: A&Ox4, no focal deficits  Resolved Hospital Problem list   Lactatemia   Assessment & Plan:  Shock Hypotension Unclear etiology for her shock, likely multifactorial with hypovolemia/poor po intake, and polypharmacy. Lactic acid normalized. Multiple hospitalizations for symptomatic hypotension, unclear etiology.  - Continue levo and wean as able; MAP goal  >65 - Start midodrine 5 mg tid - Echocardiogram - Hold av nodal blocking agents  - AM cortisol in process -  Procalcitonin pending - BC/UC pending, continue unasyn - Obtain orthostatics  - PT/OT  Bradycardia Hx of paroxysmal Afib On Losartan 100 mg daily at  home.  Heart rate has ranged from 50s to 60s here. Trops negative.  - Hold av nodal blocking agents - Echo pending - Cardiac monitoring  Hypomagnesemia Mg low at 1.6. - Replete prn   Hx of pericardial effusion status post pericardiocentesis MVR/TVR Bedside echo this morning with no evidence of effusion. - Follow up formal echo  Acute kidney failure Hypovolemic hyponatremia Baseline Cr 1.0, up to 4 on admission. Likely prerenal kidney injury in setting of shock and hypovolemia. Renal ultrasound normal.  - Daily BMP - Avoid nephrotoxins  Chronic pain syndrome Per PDMP regimen of oxycodone, gabapentin, and trazodone.  - Resumed home trazodone, oxy, and gabapentin  Normocytic anemia Hb stable at 8.6. Recent admission for lower GI bleed with multiple polyps and angiodysplasia found. Denies any signs of GI bleeding.  - Daily CBC   Anxiety and depression Resumed home Abilify, Wellbutrin, and lexapro.    HFpEF Recent echocardiogram last month with EF of 60-65%. Holding diuretics with hypovolemia. BNP 500, similar to levels in the past.  - Strict I&Os and daily weights  Best Practice (right click and "Reselect all SmartList Selections" daily)   Diet/type: Regular consistency (see orders) DVT prophylaxis: systemic heparin GI prophylaxis: PPI Lines: N/A Foley:  N/A Code Status:  full code Last date of multidisciplinary goals of care discussion [updated pt at bedside 9/8]  Labs   CBC: Recent Labs  Lab 08/07/22 0705 08/07/22 1250 08/08/22 0540 08/08/22 1802 08/09/22 0730 08/10/22 0231 08/12/22 1352  WBC 5.7  --  5.4  --  4.8 5.1 10.4  NEUTROABS  --   --   --   --   --   --  7.8*  HGB 8.0*   < > 7.7* 10.2* 9.3* 9.9* 8.6*  HCT 24.0*   < > 23.6* 30.2* 28.2* 29.7* 27.8*  MCV 87.3  --  88.1  --  87.6 86.6 92.1  PLT 198  --  197  --  198 204 194   < > = values in this interval not displayed.    Basic Metabolic Panel: Recent Labs  Lab 08/07/22 0708 08/08/22 0540  08/09/22 0730 08/10/22 0231 08/12/22 1352 08/12/22 1829  NA 137 138 137 140 132*  --   K 3.9 4.1 3.8 3.8 4.2  --   CL 106 107 106 103 102  --   CO2 '24 25 23 27 22  '$ --   GLUCOSE 86 99 93 97 108*  --   BUN '22 17 13 10 21  '$ --   CREATININE 1.38* 1.08* 1.06* 0.99 4.07*  --   CALCIUM 8.0* 8.0* 8.3* 8.8* 7.4*  --   MG 1.5* 1.5* 1.7 1.8  --  1.5*  PHOS  --  3.3 3.2 3.4  --  6.0*   GFR: Estimated Creatinine Clearance: 18 mL/min (A) (by C-G formula based on SCr of 4.07 mg/dL (H)). Recent Labs  Lab 08/08/22 0540 08/09/22 0730 08/10/22 0231 08/12/22 1352 08/12/22 1829  WBC 5.4 4.8 5.1 10.4  --   LATICACIDVEN  --   --   --  2.3* 1.5    Liver Function Tests: Recent Labs  Lab 08/08/22 0540 08/09/22 0730 08/10/22 0231 08/12/22 1352  AST  --   --   --  19  ALT  --   --   --  11  ALKPHOS  --   --   --  57  BILITOT  --   --   --  0.4  PROT  --   --   --  6.1*  ALBUMIN 2.9* 3.1* 3.4* 3.6   No results for input(s): "LIPASE", "AMYLASE" in the last 168 hours. No results for input(s): "AMMONIA" in the last 168 hours.  ABG    Component Value Date/Time   PHART 7.319 (L) 04/19/2022 2202   PCO2ART 40.7 04/19/2022 2202   PO2ART 152 (H) 04/19/2022 2202   HCO3 24.7 08/05/2022 1821   TCO2 19 (L) 07/26/2022 1149   ACIDBASEDEF 5.0 (H) 04/19/2022 2202   O2SAT 99 08/05/2022 1821     Coagulation Profile: Recent Labs  Lab 08/07/22 0708 08/08/22 0540 08/09/22 0222 08/10/22 0231 08/12/22 1829  INR 1.7* 1.6* 1.5* 1.2 1.7*    Cardiac Enzymes: Recent Labs  Lab 08/07/22 0708  CKTOTAL 29*    HbA1C: Hgb A1c MFr Bld  Date/Time Value Ref Range Status  07/22/2022 11:58 PM 6.0 (H) 4.8 - 5.6 % Corrected    Comment:    (NOTE)         Prediabetes: 5.7 - 6.4         Diabetes: >6.4         Glycemic control for adults with diabetes: <7.0 CORRECTED ON 08/18 AT 2235: PREVIOUSLY REPORTED AS 5.4   04/15/2022 10:32 AM 6.1 (H) 4.8 - 5.6 % Final    Comment:    (NOTE) Pre diabetes:           5.7%-6.4%  Diabetes:              >6.4%  Glycemic control for   <7.0% adults with diabetes     CBG: Recent Labs  Lab 08/08/22 1059 08/08/22 1629 08/12/22 1343 08/12/22 1725 08/13/22 0012  GLUCAP 105* 91 116* 159* 110*    Past Medical History:  She,  has a past medical history of Acute pericardial effusion (05/17/2022), AKI (acute kidney injury) ([place]) (05/17/2022), Anxiety, Arthritis, Cervicalgia, CHF (congestive heart failure) (HCC), Chondromalacia of patella, Chronic neck pain, Chronic pain syndrome, Depression, Diabetic neuropathy ([place]), DMII (diabetes mellitus, type 2) ([place]), Dysrhythmia, Enthesopathy of hip region, Fibromyalgia, GERD (gastroesophageal reflux disease), Hep C w/o coma, chronic (HCC), Hepatitis C, Hypertension, Insomnia, Low back pain, Lumbago, Mitral regurgitation, Obesity, Pain in joint, upper arm, Primary localized osteoarthrosis, lower leg, 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 (05/17/2022), Substance abuse ([place]), Tobacco abuse, Tricuspid regurgitation, and 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   Surgical History:   Past Surgical History:  Procedure Laterality Date   ABDOMINAL HYSTERECTOMY     at age 56, unknown reasons   CARPAL TUNNEL RELEASE  12/07/2011   left side   COLONOSCOPY WITH PROPOFOL N/A 07/30/2022   Procedure: COLONOSCOPY WITH PROPOFOL;  Surgeon: Ladene Artist, MD;  Location: [place];  Service: Gastroenterology;  Laterality: N/A;   EYE SURGERY     FOOT SURGERY Bilateral    HOT HEMOSTASIS N/A 07/30/2022   Procedure: HOT HEMOSTASIS (ARGON PLASMA COAGULATION/BICAP);  Surgeon: Ladene Artist, MD;  Location: [place];  Service: Gastroenterology;  Laterality: N/A;   MITRAL VALVE REPAIR N/A 04/19/2022   Procedure: MITRAL VALVE REPAIR WITH 30MM PHYSIO II ANNULOPLASTY RING;  Surgeon: Melrose Nakayama, MD;  Location: [place];  Service: Open Heart Surgery;  Laterality: N/A;   PERICARDIOCENTESIS N/A 05/10/2022   Procedure:  PERICARDIOCENTESIS;  Surgeon: Jettie Booze, MD;  Location: [place] CV LAB;  Service: Cardiovascular;  Laterality: N/A;   POLYPECTOMY  07/30/2022   Procedure: POLYPECTOMY;  Surgeon: Ladene Artist, MD;  Location: [facility] ENDOSCOPY;  Service: Gastroenterology;;   RIGHT/LEFT HEART CATH AND CORONARY ANGIOGRAPHY N/A 02/12/2022   Procedure: RIGHT/LEFT HEART CATH AND CORONARY ANGIOGRAPHY;  Surgeon: Belva Crome, MD;  Location: [place] [facility];  Service: Cardiovascular;  Laterality: N/A;   TEE WITHOUT CARDIOVERSION N/A 02/03/2022   Procedure: TRANSESOPHAGEAL ECHOCARDIOGRAM (TEE);  Surgeon: Josue Hector, MD;  Location: [facility] ENDOSCOPY;  Service: Cardiovascular;  Laterality: N/A;   TEE WITHOUT CARDIOVERSION N/A 04/19/2022   Procedure: TRANSESOPHAGEAL ECHOCARDIOGRAM (TEE);  Surgeon: Melrose Nakayama, MD;  Location: [place];  Service: Open Heart Surgery;  Laterality: N/A;   TEE WITHOUT CARDIOVERSION N/A 07/28/2022   Procedure: TRANSESOPHAGEAL ECHOCARDIOGRAM (TEE);  Surgeon: Sanda Klein, MD;  Location: [place];  Service: Cardiovascular;  Laterality: N/A;   TRICUSPID VALVE REPLACEMENT N/A 04/19/2022   Procedure: TRICUSPID VALVE REPAIR WITH 32MM MC3 ANNULOPLASTY RING;  Surgeon: Melrose Nakayama, MD;  Location: [place];  Service: Open Heart Surgery;  Laterality: N/A;     Social History:   reports that she has been smoking cigarettes. She has a 8.75 pack-year smoking history. She has never used smokeless tobacco. She reports that she does not drink alcohol and does not use drugs.   Family History:  Her family history includes Alcohol abuse in her brother, father, and sister; Arthritis in an other family member; Diabetes in an other family member; Drug abuse in her brother and sister; Heart attack (age of onset: 55) in her son; Hypertension in an other family member; Schizophrenia in her son; Uterine cancer in her mother.   Allergies Allergies  Allergen Reactions   Norco  [Hydrocodone-Acetaminophen] Itching and Other (See Comments)    Tolerates Oxycodone     Home Medications  Prior to Admission medications   Medication Sig Start Date End Date Taking? Authorizing Provider  acetaminophen (TYLENOL) 500 MG tablet Take 1,000 mg by mouth every 6 (six) hours as needed for mild pain.    [provider]  albuterol (PROVENTIL HFA;VENTOLIN HFA) 108 (90 Base) MCG/ACT inhaler Inhale 2 puffs into the lungs every 6 (six) hours as needed for wheezing or shortness of breath.     [provider]  ARIPiprazole (ABILIFY) 5 MG tablet Take 5 mg by mouth daily. 07/20/22   [provider]  atorvastatin (LIPITOR) 20 MG tablet Take 20 mg by mouth daily.    [provider]  buPROPion (WELLBUTRIN XL) 300 MG 24 hr tablet Take 300 mg by mouth in the morning. 03/24/22   [provider]  dexlansoprazole (DEXILANT) 60 MG capsule Take 1 capsule by mouth daily. 07/26/22   [provider]  escitalopram (LEXAPRO) 10 MG tablet Take 10 mg by mouth every morning. 07/08/22   [provider]  FARXIGA 10 MG TABS tablet Take 10 mg by mouth daily. 08/10/22   [provider]  furosemide (LASIX) 20 MG tablet Take 20 mg by mouth daily. Pt takes 1/2 tablet 10 mg once daily.    [provider]  gabapentin (NEURONTIN) 600 MG tablet Take 0.5 tablets (300 mg total) by mouth 3 (three) times daily. 05/30/22   [name], [name], MD  losartan (COZAAR) 100 MG tablet Take 1 tablet (100 mg total) by mouth daily. Hold until you see your physician 08/04/22   Lorella Nimrod, MD  naloxone [facility]) nasal spray 4 mg/0.1 mL Place 1 spray into the nose as needed (accidental overdose).    [provider]  neomycin-polymyxin b-dexamethasone (MAXITROL) 3.5-10000-0.1 SUSP Place 1 drop into both eyes daily at 12 noon. 08/11/22   [provider]  nicotine (NICODERM CQ - DOSED IN MG/24 HOURS) 14 mg/24hr patch Place 1 patch (14 mg total) onto the skin  daily. 08/10/22   Mercy Riding, MD  oxyCODONE (ROXICODONE) 15 MG immediate release tablet Take 0.5 tablets (7.5 mg total) by mouth every 12 (twelve) hours as needed for pain. 08/04/22   Lorella Nimrod, MD  PREMARIN 0.45 MG tablet Take 0.45 mg by mouth daily. 01/11/22   [provider]  spironolactone (ALDACTONE) 25 MG tablet Take 12.5 mg by mouth daily. 07/26/22   [provider]  traZODone (DESYREL) 100 MG tablet Take 1 tablet (100 mg total) by mouth at bedtime as needed for sleep. 08/04/22   Lorella Nimrod, MD  warfarin (COUMADIN) 5 MG tablet TAKE 1 TABLET BY MOUTH DAILY OR AS DIRECTED BY ANTICOAGULATION CLINIC. Patient taking differently: Take 5 mg by mouth daily. Take 1 tablet by mouth daily and 1.5 on sundays 07/02/22   Werner Lean, MD     Critical care time: 30 mins

## 2022-08-13 NOTE — Progress Notes (Signed)
OT Cancellation Note  Patient Details Name: [name] MRN: [id number] DOB: 1961-04-21   Cancelled Treatment:    Reason Eval/Treat Not Completed: Other (comment). Pt currently in middle of eating breakfast. Will check back with her shortly for evaluation.  [place], OTR/L [facility] [phone] Office [phone]    Almon Register 08/13/2022, 8:45 AM

## 2022-08-13 NOTE — H&P (Signed)
ANTICOAGULATION CONSULT NOTE - Follow Up Consult  Pharmacy Consult for Heparin Indication: atrial fibrillation  Allergies  Allergen Reactions   Norco [Hydrocodone-Acetaminophen] Itching and Other (See Comments)    Tolerates Oxycodone    Patient Measurements: Height: '5\' 11"'$  (180.3 cm) Weight: 92.5 kg (203 lb 14.8 oz) IBW/kg (Calculated) : 70.8 Heparin Dosing Weight: 90.5 kg  Vital Signs: Temp: 97.6 F (36.4 C) (09/08 0700) Temp Source: Oral (09/08 0700) BP: 93/57 (09/08 0745) Pulse Rate: 77 (09/08 0745)  Labs: Recent Labs    08/12/22 1352 08/12/22 1829 08/13/22 0620  HGB 8.6*  --  8.6*  HCT 27.8*  --  26.2*  PLT 194  --  175  LABPROT  --  19.7*  --   INR  --  1.7*  --   HEPARINUNFRC  --   --  0.39  CREATININE 4.07*  --   --   TROPONINIHS  --  13  --     Estimated Creatinine Clearance: 18 mL/min (A) (by C-G formula based on SCr of 4.07 mg/dL (H)).   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  Admitted today due to hypotension and AKI.  Pharmacy consulted to continue [facility] with heparin. INR on 9/7 was 1.7  Initial anti-Xa level is therapeutic at 0.39 on current rate of 1300 units/hr.  No bleeding reported.   PTA -Warfarin 5 mg daily except 7.5 mg on Sundays.  Goal of Therapy:  Heparin level 0.3-0.7 units/ml Monitor platelets by anticoagulation protocol: Yes   Plan:  Continue Heparin at 1300 units/hr. Recheck anti-Xa level in 8 hours to confirm.  Daily anti-Xa level and CBC while on therapy.  Follow-up ability to restart Warfarin therapy.   Sloan Leiter, PharmD, BCPS, BCCCP Clinical Pharmacist Please refer to [facility] : [facility] for [place] numbers 08/13/2022, 7:53 AM

## 2022-08-13 NOTE — Progress Notes (Signed)
PT Cancellation Note  Patient Details Name: [name] MRN: [id number] DOB: Nov 18, 1961   Cancelled Treatment:    Reason Eval/Treat Not Completed: OT screened, no needs identified, will sign off.  No further acute needs noted in OT evaluation.  Will sign off at this time. 08/13/2022  Ginger Carne., PT Acute Rehabilitation Services [phone]  (pager) [phone]  (office)   Tessie Fass Margarite Vessel 08/13/2022, 9:58 AM

## 2022-08-13 NOTE — Progress Notes (Signed)
  Echocardiogram 2D Echocardiogram has been performed.  [name] 08/13/2022, 11:21 AM

## 2022-08-13 NOTE — H&P (Signed)
ANTICOAGULATION CONSULT NOTE - Follow Up Consult  Pharmacy Consult for Heparin Indication: atrial fibrillation  Allergies  Allergen Reactions   Norco [Hydrocodone-Acetaminophen] Itching and Other (See Comments)    Tolerates Oxycodone    Patient Measurements: Height: '5\' 11"'$  (180.3 cm) Weight: 92.5 kg (203 lb 14.8 oz) IBW/kg (Calculated) : 70.8 Heparin Dosing Weight: 90.5 kg  Vital Signs: Temp: 97.5 F (36.4 C) (09/08 1510) Temp Source: Oral (09/08 1510) BP: 95/54 (09/08 1400) Pulse Rate: 74 (09/08 1400)  Labs: Recent Labs    08/12/22 1352 08/12/22 1829 08/13/22 0620 08/13/22 1441  HGB 8.6*  --  8.6*  --   HCT 27.8*  --  26.2*  --   PLT 194  --  175  --   LABPROT  --  19.7*  --   --   INR  --  1.7*  --   --   HEPARINUNFRC  --   --  0.39 0.63  CREATININE 4.07*  --  2.37*  --   TROPONINIHS  --  13  --   --      Estimated Creatinine Clearance: 30.9 mL/min (A) (by C-G formula based on SCr of 2.37 mg/dL (H)).   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  Admitted today due to hypotension and AKI.  Pharmacy consulted to continue [facility] with heparin. INR on 9/7 was 1.7  Initial anti-Xa level is therapeutic at 0.39 on current rate of 1300 units/hr.  No bleeding reported.   PTA -Warfarin 5 mg daily except 7.5 mg on Sundays.  Confirm heparin level therapeutic this PM. Cont same rate.  Goal of Therapy:  Heparin level 0.3-0.7 units/ml Monitor platelets by anticoagulation protocol: Yes   Plan:  Continue Heparin at 1300 units/hr. Daily anti-Xa level and CBC while on therapy.  Follow-up ability to restart Warfarin therapy.   Onnie Boer, PharmD, BCIDP, AAHIVP, CPP Infectious Disease Pharmacist 08/13/2022 3:55 PM

## 2022-08-13 NOTE — Progress Notes (Signed)
Rounding Note    Patient Name: [name] Date of Encounter: 08/13/2022  [place] Cardiologist: Werner Lean, MD   Subjective   Comfortable, standing with PT  Inpatient Medications    Scheduled Meds:  ARIPiprazole  5 mg Oral Daily   buPROPion  300 mg Oral q AM   Chlorhexidine Gluconate Cloth  6 each Topical Q0600   escitalopram  10 mg Oral q morning   gabapentin  300 mg Oral BID   midodrine  5 mg Oral TID WC   nicotine  14 mg Transdermal Daily   pantoprazole  40 mg Oral Daily   Continuous Infusions:  sodium chloride Stopped (08/13/22 0703)   cefTRIAXone (ROCEPHIN)  IV     heparin 1,300 Units/hr (08/13/22 0800)   magnesium sulfate bolus IVPB 4 g (08/13/22 0803)   norepinephrine (LEVOPHED) Adult infusion 3 mcg/min (08/13/22 0800)   PRN Meds: acetaminophen, HYDROmorphone (DILAUDID) injection, oxyCODONE, traZODone   Vital Signs    Vitals:   08/13/22 0700 08/13/22 0735 08/13/22 0740 08/13/22 0745  BP: (!) 89/61 (!) 100/58 94/78 (!) 93/57  Pulse: 73 74 77 77  Resp: '20 14 17 20  '$ Temp: 97.6 F (36.4 C)     TempSrc: Oral     SpO2: 96%   97%  Weight:      Height:        Intake/Output Summary (Last 24 hours) at 08/13/2022 0918 Last data filed at 08/13/2022 0701 Gross per 24 hour  Intake 3404.87 ml  Output --  Net 3404.87 ml      08/13/2022    5:00 AM 08/12/2022    5:00 PM 08/12/2022   11:22 AM  Last 3 Weights  Weight (lbs) 203 lb 14.8 oz 205 lb 4 oz 210 lb  Weight (kg) 92.5 kg 93.1 kg 95.255 kg      Telemetry    No adverse arrhythmias currently heart rates in the 60s- Personally Reviewed  ECG    56 right bundle branch block- Personally Reviewed  Physical Exam   GEN: No acute distress.   Neck: No JVD Cardiac: RRR, no murmurs, rubs, or gallops.  Respiratory: Clear to auscultation bilaterally. GI: Soft, nontender, non-distended  MS: No edema; No deformity. Neuro:  Nonfocal  Psych: Normal affect   Labs    High Sensitivity  Troponin:   Recent Labs  Lab 08/05/22 1130 08/05/22 1430 08/05/22 1812 08/05/22 2038 08/12/22 1829  TROPONINIHS 69* 143* 191* 190* 13     Chemistry Recent Labs  Lab 08/09/22 0730 08/10/22 0231 08/12/22 1352 08/12/22 1829 08/13/22 0620  NA 137 140 132*  --  133*  K 3.8 3.8 4.2  --  3.6  CL 106 103 102  --  103  CO2 '23 27 22  '$ --  22  GLUCOSE 93 97 108*  --  87  BUN '13 10 21  '$ --  20  CREATININE 1.06* 0.99 4.07*  --  2.37*  CALCIUM 8.3* 8.8* 7.4*  --  7.6*  MG 1.7 1.8  --  1.5* 1.6*  PROT  --   --  6.1*  --   --   ALBUMIN 3.1* 3.4* 3.6  --   --   AST  --   --  19  --   --   ALT  --   --  11  --   --   ALKPHOS  --   --  57  --   --   BILITOT  --   --  0.4  --   --   GFRNONAA 59* >60 12*  --  23*  ANIONGAP '8 10 8  '$ --  8    Lipids No results for input(s): "CHOL", "TRIG", "HDL", "LABVLDL", "LDLCALC", "CHOLHDL" in the last 168 hours.  Hematology Recent Labs  Lab 08/10/22 0231 08/12/22 1352 08/13/22 0620  WBC 5.1 10.4 7.0  RBC 3.43* 3.02* 3.00*  HGB 9.9* 8.6* 8.6*  HCT 29.7* 27.8* 26.2*  MCV 86.6 92.1 87.3  MCH 28.9 28.5 28.7  MCHC 33.3 30.9 32.8  RDW 15.9* 15.9* 15.3  PLT 204 194 175   Thyroid No results for input(s): "TSH", "FREET4" in the last 168 hours.  BNP Recent Labs  Lab 08/10/22 0231 08/12/22 1352 08/12/22 1829  BNP 713.2* 555.6* 497.2*    DDimer No results for input(s): "DDIMER" in the last 168 hours.   Radiology    US RENAL  Result Date: 08/12/2022 CLINICAL DATA:  Acute kidney injury EXAM: RENAL / URINARY TRACT ULTRASOUND COMPLETE COMPARISON:  None Available. FINDINGS: Right Kidney: Renal measurements: 10.2 by 4.2 x 4.4 cm = volume: 98.3 mL. Echogenicity within normal limits. No mass or hydronephrosis visualized. Left Kidney: Renal measurements: 9.5 x 5.8 x 3.9 cm = volume: 110.3 mL. Echogenicity within normal limits. No mass or hydronephrosis visualized. Bladder: Appears normal for degree of bladder distention. Other: None. IMPRESSION: Negative  renal ultrasound Electronically Signed   By: Donavan Foil M.D.   On: 08/12/2022 18:54   DG Chest 2 View  Result Date: 08/12/2022 CLINICAL DATA:  Shortness of breath and lightheaded. EXAM: CHEST - 2 VIEW COMPARISON:  08/06/2022 FINDINGS: The cardiac silhouette, mediastinal and hilar contours are within normal limits and stable. 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 The lungs are clear of an acute process. No infiltrates, edema or effusions. No pulmonary lesions. The bony thorax is intact. IMPRESSION: No acute cardiopulmonary findings. Electronically Signed   By: Marijo Sanes M.D.   On: 08/12/2022 14:22      Patient Profile     61 y.o. female admitted with hypotension status post MVR/TVR chronic pain  Assessment & Plan    Hypotension - Improved.  Midodrine 5 mg 3 times daily.  Ruling out adrenal insufficiency.  Multiple hospitalizations. -Stopped losartan 100 mg -Stopped low-dose Lasix 20 mg -Stopped low-dose spironolactone 12.5 mg  History of pericardial effusion - No evidence on bedside echo.  Formal pending.  MVR/TVR - TEE reviewed.  5 mmHg mean gradient across mitral valve.  Overall reassuring in the setting of annuloplasty.  AKI - Hypotension/hypovolemia -Creatinine 4 on admission.  Currently 2.3.  3 days ago was 0.9.  Chronic pain - Patient was hypotensive yesterday 60/40.  First question from her was can I have more pain medication.  This may also be contributing to her hypotension.   For questions or updates, please contact [place] Please consult [email or web address] for contact info under        Signed, Candee Furbish, MD  08/13/2022, 9:18 AM

## 2022-08-14 DIAGNOSIS — I3139 Other pericardial effusion (noninflammatory): Secondary | ICD-10-CM

## 2022-08-14 DIAGNOSIS — J9601 Acute respiratory failure with hypoxia: Secondary | ICD-10-CM

## 2022-08-14 DIAGNOSIS — I951 Orthostatic hypotension: Secondary | ICD-10-CM | POA: Diagnosis not present

## 2022-08-14 DIAGNOSIS — N179 Acute kidney failure, unspecified: Secondary | ICD-10-CM | POA: Diagnosis not present

## 2022-08-14 DIAGNOSIS — I5023 Acute on chronic systolic (congestive) heart failure: Secondary | ICD-10-CM

## 2022-08-14 DIAGNOSIS — F419 Anxiety disorder, unspecified: Secondary | ICD-10-CM

## 2022-08-14 DIAGNOSIS — F32A Depression, unspecified: Secondary | ICD-10-CM

## 2022-08-14 DIAGNOSIS — Z9889 Other specified postprocedural states: Secondary | ICD-10-CM

## 2022-08-14 LAB — CBC
HCT: 25 % — ABNORMAL LOW (ref 36.0–46.0)
Hemoglobin: 8.5 g/dL — ABNORMAL LOW (ref 12.0–15.0)
MCH: 29.3 pg (ref 26.0–34.0)
MCHC: 34 g/dL (ref 30.0–36.0)
MCV: 86.2 fL (ref 80.0–100.0)
Platelets: 172 10*3/uL (ref 150–400)
RBC: 2.9 MIL/uL — ABNORMAL LOW (ref 3.87–5.11)
RDW: 15.3 % (ref 11.5–15.5)
WBC: 5.2 10*3/uL (ref 4.0–10.5)
nRBC: 0 % (ref 0.0–0.2)

## 2022-08-14 LAB — PROTIME-INR
INR: 1.4 — ABNORMAL HIGH (ref 0.8–1.2)
Prothrombin Time: 17 seconds — ABNORMAL HIGH (ref 11.4–15.2)

## 2022-08-14 LAB — HEPARIN LEVEL (UNFRACTIONATED)
Heparin Unfractionated: 0.86 IU/mL — ABNORMAL HIGH (ref 0.30–0.70)
Heparin Unfractionated: 0.59 IU/mL (ref 0.30–0.70)

## 2022-08-14 LAB — GLUCOSE, CAPILLARY
Glucose-Capillary: 86 mg/dL (ref 70–99)
Glucose-Capillary: 79 mg/dL (ref 70–99)

## 2022-08-14 MED ORDER — WARFARIN - PHARMACIST DOSING INPATIENT: Freq: Every day | Status: DC

## 2022-08-14 MED ORDER — COSYNTROPIN 0.25 MG IJ SOLR
0.2500 mg | Freq: Once | INTRAMUSCULAR | Status: AC
  Administered 2022-08-15: 0.25 mg via INTRAVENOUS
  Filled 2022-08-14: qty 0.25

## 2022-08-14 MED ORDER — WARFARIN SODIUM 7.5 MG PO TABS
7.5000 mg | ORAL_TABLET | Freq: Once | ORAL | Status: AC
  Administered 2022-08-14: 7.5 mg via ORAL
  Filled 2022-08-14: qty 1

## 2022-08-14 MED ORDER — MAGNESIUM SULFATE 2 GM/50ML IV SOLN
2.0000 g | Freq: Once | INTRAVENOUS | Status: AC
  Administered 2022-08-14: 2 g via INTRAVENOUS

## 2022-08-14 NOTE — Assessment & Plan Note (Signed)
Recovered systolic function.  Echocardiogram with preserved LV systolic function with EF 60 to 65%, moderate LVH, interventricular septum flattened in systole and diastole, preserved RV systolic function. Moderate dilatation of LA. TR mild to moderate.   Continue blood pressure control with amlodipine, metoprolol and losartan. Hold on furosemide and spironolactone therapy for now. Patient clinically euvolemic, resume empagliflozin

## 2022-08-14 NOTE — Assessment & Plan Note (Signed)
Respiratory failure has resolved.

## 2022-08-14 NOTE — Assessment & Plan Note (Signed)
Likely multifactorial, including medication and volume induced.  Patient has been placed on midodrine with good toleration, weaned off norepinephrine infusion.   Her am cortisol is low normal but for her acute illness is expected to be higher. Will check stim test, for now hold on stress dose steroids.

## 2022-08-14 NOTE — Assessment & Plan Note (Signed)
Continue with bupropion, aripirazole, escitalopram, and trazodone.

## 2022-08-14 NOTE — Hospital Course (Addendum)
Mrs. Valade was admitted to [facility] hospital with hypotension and shock.   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 Patient with multiple hospitalizations due to bradycardia and hypotension, possible medication induced. Last hospitalization 08/31 to 08/10/22 acute on chronic heart failure with respiratory failure.  Patient reported multiple near syncope episodes at home, and orthostatic symptoms. She was evaluated by cardiology as outpatient and found hypotensive, she was referred to the ED for further evaluation.  On her initial physical examination her blood pressure was 82/42, 70/42, HR 52, RR 15, 02 saturation 86% on room air. Lungs were clear to auscultation, heart with S1 and S2 present and rhythmic, abdomen not distended and not tender, no lower extremity edema, she was awake and alert.   Na 132, K 4,2 CL 102, bicarbonate 22, glucose 108, bun 21 cr 4,0 BNP 555 Lactic acid 2,3  Wbc 10,4 hgb 8,6 plt 194  Sars covid 19 negative  Blood cultures with no growth.   Chest radiograph with mild cardiomegaly, mild bilateral hilar vascular congestion, no infiltrates and no effusions.   EKG 56 bpm, normal axis, qtc 513, sinus rhythm with no significant ST segment changes, negative T wave in all leads.   Hypotension refractive to IV fluids.  Patient was paced on vasopressors and admitted to the intensive care unit.  Started on midodrine and able to wean off vasopressor (norepinephrine infusion).   09/09 transferred to [facility].

## 2022-08-14 NOTE — Assessment & Plan Note (Signed)
Hyponatremia. Hypomagnesemia.   Renal function with serum cr at 2,37 with K at 3,6 and serum bicarbonate at 22. Plan to continue Mg correction with Mag sulfate.  Holding diuretic therapy. Follow up renal function in am, avoid hypotension and nephrotoxic medications.

## 2022-08-14 NOTE — Progress Notes (Signed)
Progress Note   Patient: [name] VQM:[id number] DOB: 02/01/1961 DOA: 08/12/2022     2 DOS: the patient was seen and examined on 08/14/2022   Brief hospital course: [name] was admitted to [facility] hospital with hypotension and shock.   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 Patient with multiple hospitalizations due to bradycardia and hypotension, possible medication induced. Last hospitalization 08/31 to 08/10/22 acute on chronic heart failure with respiratory failure.  Patient reported multiple near syncope episodes at home, and orthostatic symptoms. She was evaluated by cardiology as outpatient and found hypotensive, she was referred to the ED for further evaluation.  On her initial physical examination her blood pressure was 82/42, 70/42, HR 52, RR 15, 02 saturation 86% on room air. Lungs were clear to auscultation, heart with S1 and S2 present and rhythmic, abdomen not distended and not tender, no lower extremity edema, she was awake and alert.   Na 132, K 4,2 CL 102, bicarbonate 22, glucose 108, bun 21 cr 4,0 BNP 555 Lactic acid 2,3  Wbc 10,4 hgb 8,6 plt 194  Sars covid 19 negative  Blood cultures with no growth.   Chest radiograph with mild cardiomegaly, mild bilateral hilar vascular congestion, no infiltrates and no effusions.   EKG 56 bpm, normal axis, qtc 513, sinus rhythm with no significant ST segment changes, negative T wave in all leads.   Hypotension refractive to IV fluids.  Patient was paced on vasopressors and admitted to the intensive care unit.  Started on midodrine and able to wean off vasopressor (norepinephrine infusion).   09/09 transferred to [facility].   Assessment and Plan: * Hypotension Likely multifactorial, including medication and volume induced.  Patient  has been placed on midodrine with good toleration, weaned off norepinephrine infusion.   Her am cortisol is low normal but for her acute illness is expected to be higher. Will check stim test, for now hold on stress dose steroids.   Acute kidney injury (nontraumatic) (HCC) Hyponatremia. Hypomagnesemia.   Renal function with serum cr at 2,37 with K at 3,6 and serum bicarbonate at 22. Plan to continue Mg correction with Mag sulfate.  Holding diuretic therapy. Follow up renal function in am, avoid hypotension and nephrotoxic medications.   Acute on chronic systolic CHF (congestive heart failure) (HCC) Echocardiogram with preserved LV systolic function with EF 60 to 65%, moderate LVH, interventricular septum flattened in systole and diastole, preserved RV systolic function. Moderate dilatation of LA. TR mild to moderate.   Documented urine output is 1,700 cc ml.  Continue to hold on blood pressure medications and diuretics due to risk of hypotension.   Acute respiratory failure with hypoxia (HCC) Oxygenation has improved.  Today she is on room air, with 02 saturation 97%  Paroxysmal atrial fibrillation (HCC) Heart rate has been controlled, continue anticoagulation with heparin IV, will start patient on warfarin.  Valvular heart disease.   Type 2 diabetes mellitus with hyperlipidemia (HCC) Continue glucose cover and monitoring with insulin sliding scale. Patient is tolerating po well.   Continue with statin therapy.   Anxiety and depression Continue with bupropion, aripirazole, escitalopram, and trazodone.         Subjective: Patient is feeling better, no chest pain or dyspnea, no nausea or vomiting.   Physical Exam: Vitals:   08/14/22 0500 08/14/22 0626 08/14/22 0848 08/14/22 1123  BP:  130/82 132/82 (!) 157/91  Pulse:  86  97  Resp: '16 14  18  '$ Temp:  98.3 F (36.8 C)  98.2 F (36.8 C)  TempSrc:  Oral  Oral  SpO2:  95%  97%  Weight:      Height:       Neurology  awake and alert ENT with mild pallor Cardiovascular with S1 and S2 present and rhythmic with no gallops, rubs or murmurs Respiratory with no rales or wheezing Abdomen with no distention No lower extremity edema  Data Reviewed:    Family Communication: no family at the bedside   Disposition: Status is: Inpatient Remains inpatient appropriate because: hypotension   Planned Discharge Destination: Home     Author: Tawni Millers, MD 08/14/2022 12:44 PM  For on call review [email or web address].

## 2022-08-14 NOTE — Assessment & Plan Note (Addendum)
Her glucose remained stable, she was placed on insulin sliding scale during her hospitalization for glucose cover and monitoring.  Resume empagliflozin and linagliptin  Continue with statin therapy.

## 2022-08-14 NOTE — Progress Notes (Signed)
ANTICOAGULATION CONSULT NOTE - Follow Up Consult  Pharmacy Consult for Heparin Indication: atrial fibrillation  Allergies  Allergen Reactions   Norco [Hydrocodone-Acetaminophen] Itching and Other (See Comments)    Tolerates Oxycodone    Patient Measurements: Height: '5\' 11"'$  (180.3 cm) Weight: 93.4 kg (205 lb 12.8 oz) IBW/kg (Calculated) : 70.8 Heparin Dosing Weight: 90.2 kg  Vital Signs: Temp: 98.3 F (36.8 C) (09/09 0626) Temp Source: Oral (09/09 0626) BP: 132/82 (09/09 0848) Pulse Rate: 86 (09/09 0626)  Labs: Recent Labs    08/12/22 1352 08/12/22 1829 08/13/22 0620 08/13/22 1441 08/14/22 0325  HGB 8.6*  --  8.6*  --  8.5*  HCT 27.8*  --  26.2*  --  25.0*  PLT 194  --  175  --  172  LABPROT  --  19.7*  --   --   --   INR  --  1.7*  --   --   --   HEPARINUNFRC  --   --  0.39 0.63 0.86*  CREATININE 4.07*  --  2.37*  --   --   TROPONINIHS  --  13  --   --   --      Estimated Creatinine Clearance: 31 mL/min (A) (by C-G formula based on SCr of 2.37 mg/dL (H)).   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  Admitted today due to hypotension and AKI.  Pharmacy consulted to continue [facility] with heparin. INR on 9/7 was 1.7  Heparin level supratherapeutic at 0.86 on 1300 units/hr.  No bleeding or issues with infusion per RN. Hgb 8.5, PLT 172.   PTA -Warfarin 5 mg daily except 7.5 mg on Sundays.  Confirm heparin level therapeutic this PM. Cont same rate.  Goal of Therapy:  Heparin level 0.3-0.7 units/ml Monitor platelets by anticoagulation protocol: Yes   Plan:  Decrease to 1,100 units/hr. Check 8 hour heparin level Daily heparin level and CBC while on therapy. Monitor s/sx bleeding  Follow-up ability to restart Warfarin therapy.   Eliseo Gum, PharmD PGY1 Pharmacy  Resident   08/14/2022  9:12 AM

## 2022-08-14 NOTE — Assessment & Plan Note (Signed)
Valvular atrial fibrillation.  Heart rate has been controlled, with metoprolol.  During her acute illness she was placed on heparin and warfarin was held. Eventually warfarin has been resumed with good toleration.   INR is 1,9 today

## 2022-08-14 NOTE — Progress Notes (Addendum)
Rounding Note    Patient Name: [name] Date of Encounter: 08/14/2022  [place] Cardiologist: Werner Lean, MD   Subjective   No chest pain or short of breath.  She is on midodrine 5 mg 3 times daily and off all blood pressure medicines due to hypotension.  BP this morning 130/82 mmHg.  Inpatient Medications    Scheduled Meds:  ARIPiprazole  5 mg Oral Daily   buPROPion  300 mg Oral q AM   Chlorhexidine Gluconate Cloth  6 each Topical Q0600   escitalopram  10 mg Oral q morning   gabapentin  300 mg Oral BID   midodrine  5 mg Oral TID WC   nicotine  14 mg Transdermal Daily   pantoprazole  40 mg Oral Daily   Continuous Infusions:  sodium chloride Stopped (08/13/22 1229)   heparin 1,300 Units/hr (08/13/22 1800)   PRN Meds: acetaminophen, oxyCODONE, traZODone   Vital Signs    Vitals:   08/14/22 0016 08/14/22 0300 08/14/22 0500 08/14/22 0626  BP: 118/75   130/82  Pulse: 85   86  Resp: '14 19 16 14  '$ Temp: 98.3 F (36.8 C)   98.3 F (36.8 C)  TempSrc: Oral   Oral  SpO2: 97%   95%  Weight: 93.4 kg     Height:        Intake/Output Summary (Last 24 hours) at 08/14/2022 6213 Last data filed at 08/14/2022 0700 Gross per 24 hour  Intake 497.54 ml  Output 1700 ml  Net -1202.46 ml       08/14/2022   12:16 AM 08/13/2022    6:39 PM 08/13/2022    5:00 AM  Last 3 Weights  Weight (lbs) 205 lb 12.8 oz 207 lb 14.3 oz 203 lb 14.8 oz  Weight (kg) 93.35 kg 94.3 kg 92.5 kg      Telemetry    This rhythm with no arrhythmias- Personally Reviewed  ECG    No new EKG to review- Personally Reviewed  Physical Exam   GEN: Well nourished, well developed in no acute distress HEENT: Normal NECK: No JVD; No carotid bruits LYMPHATICS: No lymphadenopathy CARDIAC:RRR, no murmurs, rubs, gallops RESPIRATORY:  Clear to auscultation without rales, wheezing or rhonchi  ABDOMEN: Soft, non-tender, non-distended MUSCULOSKELETAL:  No edema; No deformity  SKIN:  Warm and dry NEUROLOGIC:  Alert and oriented x 3 PSYCHIATRIC:  Normal affect   Labs    High Sensitivity Troponin:   Recent Labs  Lab 08/05/22 1130 08/05/22 1430 08/05/22 1812 08/05/22 2038 08/12/22 1829  TROPONINIHS 69* 143* 191* 190* 13      Chemistry Recent Labs  Lab 08/09/22 0730 08/10/22 0231 08/12/22 1352 08/12/22 1829 08/13/22 0620  NA 137 140 132*  --  133*  K 3.8 3.8 4.2  --  3.6  CL 106 103 102  --  103  CO2 '23 27 22  '$ --  22  GLUCOSE 93 97 108*  --  87  BUN '13 10 21  '$ --  20  CREATININE 1.06* 0.99 4.07*  --  2.37*  CALCIUM 8.3* 8.8* 7.4*  --  7.6*  MG 1.7 1.8  --  1.5* 1.6*  PROT  --   --  6.1*  --   --   ALBUMIN 3.1* 3.4* 3.6  --   --   AST  --   --  19  --   --   ALT  --   --  11  --   --  ALKPHOS  --   --  67  --   --   BILITOT  --   --  0.4  --   --   GFRNONAA 59* >60 12*  --  23*  ANIONGAP '8 10 8  '$ --  8     Lipids No results for input(s): "CHOL", "TRIG", "HDL", "LABVLDL", "LDLCALC", "CHOLHDL" in the last 168 hours.  Hematology Recent Labs  Lab 08/12/22 1352 08/13/22 0620 08/14/22 0325  WBC 10.4 7.0 5.2  RBC 3.02* 3.00* 2.90*  HGB 8.6* 8.6* 8.5*  HCT 27.8* 26.2* 25.0*  MCV 92.1 87.3 86.2  MCH 28.5 28.7 29.3  MCHC 30.9 32.8 34.0  RDW 15.9* 15.3 15.3  PLT 194 175 172    Thyroid No results for input(s): "TSH", "FREET4" in the last 168 hours.  BNP Recent Labs  Lab 08/10/22 0231 08/12/22 1352 08/12/22 1829  BNP 713.2* 555.6* 497.2*     DDimer No results for input(s): "DDIMER" in the last 168 hours.   Radiology    ECHOCARDIOGRAM COMPLETE  Result Date: 08/13/2022    ECHOCARDIOGRAM REPORT   Patient Name:   [name] Date of Exam: 08/13/2022 Medical Rec #:  [id number]         Height:       71.0 in Accession #:    [id number]        Weight:       203.9 lb Date of Birth:  [birth year]         BSA:          2.126 m Patient Age:    1 years          BP:           93/57 mmHg Patient Gender: F                 HR:           71 bpm. Exam  Location:  Inpatient Procedure: 2D Echo, Cardiac Doppler and Color Doppler Indications:    Sinus bradycardia  History:        Patient has prior history of Echocardiogram examinations, most                 recent 07/28/2022. CHF, Mitral Valve Disease, Arrythmias:Atrial                 Fibrillation; Risk Factors:Hypertension and Diabetes.                  Mitral Valve: prosthetic annuloplasty ring valve is present in                 the mitral position. Procedure Date: 04/19/22.  Sonographer:    Eartha Inch Referring Phys: [id number] [place]  1. S/P MV repair with mild MS (1.74 cm2; mean gradient 7 mmHg; HR 70); mild MR.  2. Left ventricular ejection fraction, by estimation, is 60 to 65%. The left ventricle has normal function. The left ventricle has no regional wall motion abnormalities. There is moderate concentric left ventricular hypertrophy. Left ventricular diastolic parameters are indeterminate. Elevated left atrial pressure. There is the interventricular septum is flattened in systole and diastole, consistent with right ventricular pressure and volume overload.  3. Right ventricular systolic function is normal. The right ventricular size is normal. There is mildly elevated pulmonary artery systolic pressure.  4. Left atrial size was moderately dilated.  5. Right atrial size was mildly dilated.  6. The mitral valve has been repaired/replaced. Mild  mitral valve regurgitation. Mild mitral stenosis. 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 Procedure Date: 04/19/22.  7. Tricuspid valve regurgitation is mild to moderate.  8. The aortic valve is tricuspid. Aortic valve regurgitation is not visualized. No aortic stenosis is present.  9. The inferior vena cava is normal in size with greater than 50% respiratory variability, suggesting right atrial pressure of 3 mmHg. FINDINGS  Left Ventricle: Left ventricular ejection fraction, by estimation, is 60 to 65%. The left  ventricle has normal function. The left ventricle has no regional wall motion abnormalities. The left ventricular internal cavity size was normal in size. There is  moderate concentric left ventricular hypertrophy. The interventricular septum is flattened in systole and diastole, consistent with right ventricular pressure and volume overload. Left ventricular diastolic parameters are indeterminate. Elevated left atrial pressure. Right Ventricle: The right ventricular size is normal. Right ventricular systolic function is normal. There is mildly elevated pulmonary artery systolic pressure. The tricuspid regurgitant velocity is 3.18 m/s, and with an assumed right atrial pressure of 3 mmHg, the estimated right ventricular systolic pressure is 52.8 mmHg. Left Atrium: Left atrial size was moderately dilated. Right Atrium: Right atrial size was mildly dilated. Pericardium: Trivial pericardial effusion is present. Mitral Valve: The mitral valve has been repaired/replaced. Mild mitral valve regurgitation. 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 Procedure Date: 04/19/22. Mild mitral valve stenosis. MV peak gradient, 14.6 mmHg. The mean  mitral valve gradient is 7.0 mmHg. Tricuspid Valve: The tricuspid valve is normal in structure. Tricuspid valve regurgitation is mild to moderate. No evidence of tricuspid stenosis. Aortic Valve: The aortic valve is tricuspid. Aortic valve regurgitation is not visualized. No aortic stenosis is present. Pulmonic Valve: The pulmonic valve was normal in structure. Pulmonic valve regurgitation is not visualized. No evidence of pulmonic stenosis. Aorta: The aortic root is normal in size and structure. Venous: The inferior vena cava is normal in size with greater than 50% respiratory variability, suggesting right atrial pressure of 3 mmHg. IAS/Shunts: No atrial level shunt detected by color flow Doppler. Additional Comments: S/P MV repair with mild MS (1.74 cm2; mean  gradient 7 mmHg; HR 70); mild MR.  LEFT VENTRICLE PLAX 2D LVIDd:         4.50 cm   Diastology LVIDs:         3.20 cm   LV e' medial:    4.19 cm/s LV PW:         1.30 cm   LV E/e' medial:  48.0 LV IVS:        1.30 cm   LV e' lateral:   6.32 cm/s LVOT diam:     2.00 cm   LV E/e' lateral: 31.8 LV SV:         74 LV SV Index:   35 LVOT Area:     3.14 cm  RIGHT VENTRICLE             IVC RV S prime:     10.00 cm/s  IVC diam: 1.60 cm TAPSE (M-mode): 1.0 cm LEFT ATRIUM             Index        RIGHT ATRIUM           Index LA diam:        4.50 cm 2.12 cm/m   RA Area:     13.80 cm LA Vol (A2C):   61.0 ml 28.69 ml/m  RA  Volume:   29.50 ml  13.88 ml/m LA Vol (A4C):   45.2 ml 21.26 ml/m LA Biplane Vol: 54.7 ml 25.73 ml/m  AORTIC VALVE LVOT Vmax:   122.00 cm/s LVOT Vmean:  86.400 cm/s LVOT VTI:    0.234 m  AORTA Ao Root diam: 3.50 cm MITRAL VALVE                TRICUSPID VALVE MV Area (PHT): 1.74 cm     TR Peak grad:   40.4 mmHg MV Area VTI:   1.20 cm     TR Mean grad:   32.0 mmHg MV Peak grad:  14.6 mmHg    TR Vmax:        318.00 cm/s MV Mean grad:  7.0 mmHg     TR Vmean:       274.0 cm/s MV Vmax:       1.91 m/s MV Vmean:      123.0 cm/s   SHUNTS MV Decel Time: 435 msec     Systemic VTI:  0.23 m MR Peak grad: 75.0 mmHg     Systemic Diam: 2.00 cm MR Vmax:      433.00 cm/s MV E velocity: 201.00 cm/s MV A velocity: 121.00 cm/s MV E/A ratio:  1.66 Kirk Ruths MD Electronically signed by Kirk Ruths MD Signature Date/Time: 08/13/2022/12:32:14 PM    Final    US RENAL  Result Date: 08/12/2022 CLINICAL DATA:  Acute kidney injury EXAM: RENAL / URINARY TRACT ULTRASOUND COMPLETE COMPARISON:  None Available. FINDINGS: Right Kidney: Renal measurements: 10.2 by 4.2 x 4.4 cm = volume: 98.3 mL. Echogenicity within normal limits. No mass or hydronephrosis visualized. Left Kidney: Renal measurements: 9.5 x 5.8 x 3.9 cm = volume: 110.3 mL. Echogenicity within normal limits. No mass or hydronephrosis visualized. Bladder: Appears  normal for degree of bladder distention. Other: None. IMPRESSION: Negative renal ultrasound Electronically Signed   By: Donavan Foil M.D.   On: 08/12/2022 18:54   DG Chest 2 View  Result Date: 08/12/2022 CLINICAL DATA:  Shortness of breath and lightheaded. EXAM: CHEST - 2 VIEW COMPARISON:  08/06/2022 FINDINGS: The cardiac silhouette, mediastinal and hilar contours are within normal limits and stable. 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 The lungs are clear of an acute process. No infiltrates, edema or effusions. No pulmonary lesions. The bony thorax is intact. IMPRESSION: No acute cardiopulmonary findings. Electronically Signed   By: Marijo Sanes M.D.   On: 08/12/2022 14:22    2D echo 08/2022 IMPRESSIONS   1. S/P MV repair with mild MS (1.74 cm2; mean gradient 7 mmHg; HR 70);  mild MR.   2. Left ventricular ejection fraction, by estimation, is 60 to 65%. The  left ventricle has normal function. The left ventricle has no regional  wall motion abnormalities. There is moderate concentric left ventricular  hypertrophy. Left ventricular  diastolic parameters are indeterminate. Elevated left atrial pressure.  There is the interventricular septum is flattened in systole and diastole,  consistent with right ventricular pressure and volume overload.   3. Right ventricular systolic function is normal. The right ventricular  size is normal. There is mildly elevated pulmonary artery systolic  pressure.   4. Left atrial size was moderately dilated.   5. Right atrial size was mildly dilated.   6. The mitral valve has been repaired/replaced. Mild mitral valve  regurgitation. Mild mitral stenosis. There is a prosthetic annuloplasty  ring present in the mitral position. Procedure Date: 04/19/22.  7. Tricuspid valve regurgitation is mild to moderate.   8. The aortic valve is tricuspid. Aortic valve regurgitation is not  visualized. No aortic stenosis is present.    9. The inferior vena cava is normal in size with greater than 50%  respiratory variability, suggesting right atrial pressure of 3 mmHg.   Patient Profile     62 y.o. female admitted with hypotension status post MVR/TVR and chronic pain  Assessment & Plan    Hypotension -Likely related in some part to hypovolemia as her creatinine was 4 on admission and was 0.93 days prior to admission  -this appears resolved this a.m. on midodrine 5 mg 3 times daily.  BP 130/82 mmHg ruling - she has had multiple hospitalizations for this. -Now off her losartan 100 mg daily, Lasix 20 mg daily and spironolactone 12.5 mg daily -Apparently has been asking for a lot of pain medication so there is some concern this may be contributing to her hypotension -A.m.cortisol low at 6.1 with lower limit of normal 6.7>> further work-up per [facility].    History of pericardial effusion - No evidence of pericardial effusion on 2D echo this admission  MVR/TVR - TEE reviewed from 07/2022.  5 mmHg mean gradient across mitral valve.   -- 2D echo this admission showed mild mitral regurgitation and mild to moderate mitral stenosis in the setting of prosthetic annuloplasty ring with mean mitral valve gradient 7 mmHg>> reassuring that this is not resulting in hypotension  AKI - Hypotension/hypovolemia - Creatinine 4 on admission and now improved to 2.37 yesterday.   -- 3 days ago was 0.9. -- Bmet pending this a.m.  Chronic pain -Patient was hypotensive yesterday 60/40.   --Apparently has been asking for pain medication a lot  --This may also be contributing to her hypotension.  PAF -maintaining NSR -currently on IV Heparin gtt -change to warfarin when ok with TRH     For questions or updates, please contact [place] Please consult [email or web address] for contact info under        Signed, Fransico Him, MD  08/14/2022, 7:42 AM

## 2022-08-14 NOTE — Progress Notes (Signed)
ANTICOAGULATION CONSULT NOTE - Follow Up Consult  Pharmacy Consult for Heparin Indication: atrial fibrillation  Allergies  Allergen Reactions   Norco [Hydrocodone-Acetaminophen] Itching and Other (See Comments)    Tolerates Oxycodone    Patient Measurements: Height: '5\' 11"'$  (180.3 cm) Weight: 93.4 kg (205 lb 12.8 oz) IBW/kg (Calculated) : 70.8 Heparin Dosing Weight: 90.2 kg  Vital Signs: Temp: 98.1 F (36.7 C) (09/09 1620) Temp Source: Oral (09/09 1620) BP: 152/88 (09/09 1620) Pulse Rate: 93 (09/09 1620)  Labs: Recent Labs    08/12/22 1352 08/12/22 1352 08/12/22 1829 08/13/22 0620 08/13/22 1441 08/14/22 0325 08/14/22 1429 08/14/22 1747  HGB 8.6*  --   --  8.6*  --  8.5*  --   --   HCT 27.8*  --   --  26.2*  --  25.0*  --   --   PLT 194  --   --  175  --  172  --   --   LABPROT  --   --  19.7*  --   --   --  17.0*  --   INR  --   --  1.7*  --   --   --  1.4*  --   HEPARINUNFRC  --    < >  --  0.39 0.63 0.86*  --  0.59  CREATININE 4.07*  --   --  2.37*  --   --   --   --   TROPONINIHS  --   --  13  --   --   --   --   --    < > = values in this interval not displayed.    Estimated Creatinine Clearance: 31 mL/min (A) (by C-G formula based on SCr of 2.37 mg/dL (H)).   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  Admitted today due to hypotension and AKI.  Pharmacy consulted to continue [facility] with heparin. INR on 9/7 was 1.7  Heparin level 0.59 is therapeutic on 1100 units/hr.  INR 1.4, last dose of warfarin was 9/6. Restart 9/9 per team. Noted patient has been noncompliant with outpatient INR follow ups and missed doses due to not picking up refill in late June. Last ACC visit 07/02/22.   PTA -Warfarin 5 mg daily except 7.5 mg on Sundays.   Goal of Therapy:  Heparin level  0.3-0.7 units/ml Monitor platelets by anticoagulation protocol: Yes   Plan:  Continue Heparin 1,100 units/hr Warfarin 7.'5mg'$  x1  Daily heparin level, INR, and CBC  Monitor s/sx bleeding    Benetta Spar, PharmD, BCPS, BCCP Clinical Pharmacist  Please check AMION for all [place] phone numbers After 10:00 PM, call [place] [phone]

## 2022-08-15 DIAGNOSIS — I951 Orthostatic hypotension: Secondary | ICD-10-CM | POA: Diagnosis not present

## 2022-08-15 DIAGNOSIS — J9601 Acute respiratory failure with hypoxia: Secondary | ICD-10-CM | POA: Diagnosis not present

## 2022-08-15 DIAGNOSIS — I5023 Acute on chronic systolic (congestive) heart failure: Secondary | ICD-10-CM | POA: Diagnosis not present

## 2022-08-15 DIAGNOSIS — N179 Acute kidney failure, unspecified: Secondary | ICD-10-CM | POA: Diagnosis not present

## 2022-08-15 LAB — BASIC METABOLIC PANEL
Anion gap: 7 (ref 5–15)
BUN: 7 mg/dL — ABNORMAL LOW (ref 8–23)
CO2: 22 mmol/L (ref 22–32)
Calcium: 8.3 mg/dL — ABNORMAL LOW (ref 8.9–10.3)
Chloride: 110 mmol/L (ref 98–111)
Creatinine, Ser: 0.84 mg/dL (ref 0.44–1.00)
GFR, Estimated: >60 mL/min (ref >60)
Glucose, Bld: 71 mg/dL (ref 70–99)
Potassium: 4.6 mmol/L (ref 3.5–5.1)
Sodium: 139 mmol/L (ref 135–145)

## 2022-08-15 LAB — CBC
HCT: 26.2 % — ABNORMAL LOW (ref 36.0–46.0)
Hemoglobin: 9 g/dL — ABNORMAL LOW (ref 12.0–15.0)
MCH: 29.5 pg (ref 26.0–34.0)
MCHC: 34.4 g/dL (ref 30.0–36.0)
MCV: 85.9 fL (ref 80.0–100.0)
Platelets: 176 10*3/uL (ref 150–400)
RBC: 3.05 MIL/uL — ABNORMAL LOW (ref 3.87–5.11)
RDW: 15.8 % — ABNORMAL HIGH (ref 11.5–15.5)
WBC: 5.1 10*3/uL (ref 4.0–10.5)
nRBC: 0 % (ref 0.0–0.2)

## 2022-08-15 LAB — PROTIME-INR
INR: 1.3 — ABNORMAL HIGH (ref 0.8–1.2)
Prothrombin Time: 16.4 seconds — ABNORMAL HIGH (ref 11.4–15.2)

## 2022-08-15 LAB — ACTH STIMULATION, 3 TIME POINTS
Cortisol, 30 Min: 25.1 ug/dL
Cortisol, 60 Min: 29.4 ug/dL
Cortisol, Base: 17.4 ug/dL

## 2022-08-15 LAB — HEPARIN LEVEL (UNFRACTIONATED): Heparin Unfractionated: 0.59 IU/mL (ref 0.30–0.70)

## 2022-08-15 LAB — CORTISOL: Cortisol, Plasma: 15.1 ug/dL

## 2022-08-15 MED ORDER — WARFARIN SODIUM 7.5 MG PO TABS
7.5000 mg | ORAL_TABLET | Freq: Once | ORAL | Status: AC
Start: 2022-08-15 — End: 2022-08-15
  Administered 2022-08-15: 7.5 mg via ORAL
  Filled 2022-08-15: qty 1

## 2022-08-15 MED ORDER — METOPROLOL TARTRATE 25 MG PO TABS
25.0000 mg | ORAL_TABLET | Freq: Two times a day (BID) | ORAL | Status: DC
  Administered 2022-08-15 – 2022-08-17 (×5): 25 mg via ORAL
  Filled 2022-08-15 (×5): qty 1

## 2022-08-15 MED ORDER — WARFARIN SODIUM 7.5 MG PO TABS: 7.5000 mg | ORAL_TABLET | Freq: Once | ORAL | Status: DC

## 2022-08-15 MED ORDER — AMLODIPINE BESYLATE 10 MG PO TABS
10.0000 mg | ORAL_TABLET | Freq: Every day | ORAL | Status: DC
  Administered 2022-08-15: 10 mg via ORAL
  Filled 2022-08-15: qty 1

## 2022-08-15 NOTE — Progress Notes (Signed)
ANTICOAGULATION CONSULT NOTE - Follow Up Consult  Pharmacy Consult for Heparin Indication: atrial fibrillation  Allergies  Allergen Reactions   Norco [Hydrocodone-Acetaminophen] Itching and Other (See Comments)    Tolerates Oxycodone    Patient Measurements: Height: '5\' 11"'$  (180.3 cm) Weight: 91.6 kg (201 lb 14.4 oz) IBW/kg (Calculated) : 70.8 Heparin Dosing Weight: 90.2 kg  Vital Signs: Temp: 98.1 F (36.7 C) (09/10 0614) Temp Source: Oral (09/10 0614) BP: 160/108 (09/10 0614) Pulse Rate: 78 (09/10 0614)  Labs: Recent Labs    08/12/22 1352 08/12/22 1829 08/13/22 0620 08/13/22 1441 08/14/22 0325 08/14/22 1429 08/14/22 1747 08/15/22 0616  HGB 8.6*  --  8.6*  --  8.5*  --   --  9.0*  HCT 27.8*  --  26.2*  --  25.0*  --   --  26.2*  PLT 194  --  175  --  172  --   --  176  LABPROT  --  19.7*  --   --   --  17.0*  --  16.4*  INR  --  1.7*  --   --   --  1.4*  --  1.3*  HEPARINUNFRC  --   --  0.39   < > 0.86*  --  0.59 0.59  CREATININE 4.07*  --  2.37*  --   --   --   --   --   TROPONINIHS  --  13  --   --   --   --   --   --    < > = values in this interval not displayed.     Estimated Creatinine Clearance: 30.7 mL/min (A) (by C-G formula based on SCr of 2.37 mg/dL (H)).   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  Admitted today due to hypotension and AKI.  Pharmacy consulted to continue [facility] with heparin. INR on 9/7 was 1.7  Heparin level 0.59 stable and therapeutic on 1100 units/hr. CBC stable. INR 1.3, after 7.5 mg x 1 on 9/9.   Last dose of warfarin was 9/6. Restart 9/9 per team. Noted patient has been noncompliant with outpatient INR follow ups and missed doses due to not picking up refill in late June. Last ACC visit 07/02/22.   PTA -Warfarin 5 mg daily except 7.5 mg on  Sundays.   Goal of Therapy:  Heparin level 0.3-0.7 units/ml Monitor platelets by anticoagulation protocol: Yes   Plan:  Continue Heparin 1,100 units/hr until INR therapeutic  Warfarin 7.'5mg'$  x1 tonight Daily heparin level, INR, and CBC  Monitor s/sx bleeding    Eliseo Gum, PharmD PGY1 Pharmacy Resident   08/15/2022  9:04 AM

## 2022-08-15 NOTE — Progress Notes (Signed)
Progress Note   Patient: [name] CBU:[id number] DOB: 03/06/1961 DOA: 08/12/2022     3 DOS: the patient was seen and examined on 08/15/2022   Brief hospital course: Mrs. Sassano was admitted to [facility] hospital with hypotension and shock, likely hypovolemic.   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 Patient with multiple hospitalizations due to bradycardia and hypotension, possible medication induced. Last hospitalization 08/31 to 08/10/22 acute on chronic heart failure with respiratory failure.  Patient reported multiple near syncope episodes at home, and orthostatic symptoms. She was evaluated by cardiology as outpatient and found hypotensive, she was referred to the ED for further evaluation.  On her initial physical examination her blood pressure was 82/42, 70/42, HR 52, RR 15, 02 saturation 86% on room air. Lungs were clear to auscultation, heart with S1 and S2 present and rhythmic, abdomen not distended and not tender, no lower extremity edema, she was awake and alert.   Na 132, K 4,2 CL 102, bicarbonate 22, glucose 108, bun 21 cr 4,0 BNP 555 Lactic acid 2,3  Wbc 10,4 hgb 8,6 plt 194  Sars covid 19 negative  Blood cultures with no growth.   Chest radiograph with mild cardiomegaly, mild bilateral hilar vascular congestion, no infiltrates and no effusions.   EKG 56 bpm, normal axis, qtc 513, sinus rhythm with no significant ST segment changes, negative T wave in all leads.   Hypotension refractive to IV fluids.  Patient was paced on vasopressors and admitted to the intensive care unit.  Started on midodrine and able to wean off vasopressor (norepinephrine infusion).   09/09 transferred to [facility].  09/10 blood pressure elevated, discontinue midodrine and resumed on antihypertensive  medications   Assessment and Plan: * Hypotension Likely multifactorial, including medication and volume induced.  Blood pressure this am is elevated, up to 174/100 mmHg.   ACTH stimulation test is negative for adrenal insufficiency.   Plan to resume antihypertensive therapy with metoprolol.  Hold on diuretic therapy and RAS inhibition due to recovering renal failure.  Add amlodipine 10 mg daily.   Acute kidney injury (nontraumatic) (HCC) Hyponatremia. Hypomagnesemia.   Check renal function today.  Continue to hold on RAS inhibition and diuretics for now.   Acute on chronic systolic CHF (congestive heart failure) (HCC) Recovered systolic function.  Echocardiogram with preserved LV systolic function with EF 60 to 65%, moderate LVH, interventricular septum flattened in systole and diastole, preserved RV systolic function. Moderate dilatation of LA. TR mild to moderate.   Blood pressure is elevated, will plan to resume metoprolol and amlodipine for blood pressure control.  Discontinue midodrine.   Acute respiratory failure with hypoxia (HCC) Oxygenation has improved.  Today she is on room air, with 02 saturation 97%  Paroxysmal atrial fibrillation (HCC) Heart rate has been controlled, Transition to warfarin from heparin for anticoagulation  Valvular heart disease.   Type 2 diabetes mellitus with hyperlipidemia (HCC) Continue glucose cover and monitoring with insulin sliding scale. Patient is tolerating po well.   Continue with statin therapy.   Anxiety and depression Continue with bupropion, aripirazole, escitalopram, and trazodone.         Subjective: Patient with no chest pain dyspnea, no lower extremity edema   Physical Exam: Vitals:   08/14/22 2021 08/15/22 0051 08/15/22 0614 08/15/22 1048  BP: 128/79 (!) 140/97 (!) 160/108 (!) 174/100  Pulse: 94  93 78 93  Resp: (!) '21 14 18 17  '$ Temp: 98.1 F (36.7 C) 98.1 F (36.7 C) 98.1 F (36.7 C) 99.1 F (37.3 C)   TempSrc: Oral Oral Oral Oral  SpO2: 94% 92% 98% 96%  Weight:   91.6 kg   Height:       Neurology awake and alert ENT with no pallor Cardiovascular with S1 and S2 present and rhythmic with no gallops, rubs or murmurs Respiratory with no rales or rhonchi Abdomen with no distention  No lower extremity edema  Data Reviewed:    Family Communication: no family at the bedside   Disposition: Status is: Inpatient Remains inpatient appropriate because: hypertension   Planned Discharge Destination: Home    Author: Tawni Millers, MD 08/15/2022 11:51 AM  For on call review [email or web address].

## 2022-08-15 NOTE — Progress Notes (Addendum)
Rounding Note    Patient Name: [name] Date of Encounter: 08/15/2022  [place] Cardiologist: Werner Lean, MD   Subjective   Denies any CP or SOB.   She is on midodrine 5 mg 3 times daily and off all blood pressure medicines due to hypotension.  BP this morning 160/157mHg this am  Inpatient Medications    Scheduled Meds:  ARIPiprazole  5 mg Oral Daily   buPROPion  300 mg Oral q AM   Chlorhexidine Gluconate Cloth  6 each Topical Q0600   escitalopram  10 mg Oral q morning   gabapentin  300 mg Oral BID   nicotine  14 mg Transdermal Daily   pantoprazole  40 mg Oral Daily   warfarin  7.5 mg Oral ONCE-1600   Warfarin - Pharmacist Dosing Inpatient   Does not apply q1600   Continuous Infusions:  sodium chloride Stopped (08/13/22 1229)   heparin 1,100 Units/hr (08/14/22 1209)   PRN Meds: acetaminophen, oxyCODONE, traZODone   Vital Signs    Vitals:   08/14/22 1620 08/14/22 2021 08/15/22 0051 08/15/22 0614  BP: (!) 152/88 128/79 (!) 140/97 (!) 160/108  Pulse: 93 94 93 78  Resp: 18 (!) '21 14 18  '$ Temp: 98.1 F (36.7 C) 98.1 F (36.7 C) 98.1 F (36.7 C) 98.1 F (36.7 C)  TempSrc: Oral Oral Oral Oral  SpO2: 98% 94% 92% 98%  Weight:    91.6 kg  Height:        Intake/Output Summary (Last 24 hours) at 08/15/2022 1021 Last data filed at 08/15/2022 0900 Gross per 24 hour  Intake 0 ml  Output 500 ml  Net -500 ml       08/15/2022    6:14 AM 08/14/2022   12:16 AM 08/13/2022    6:39 PM  Last 3 Weights  Weight (lbs) 201 lb 14.4 oz 205 lb 12.8 oz 207 lb 14.3 oz  Weight (kg) 91.581 kg 93.35 kg 94.3 kg      Telemetry    NSR- Personally Reviewed  ECG    No new EKG to review- Personally Reviewed  Physical Exam   GEN: Well nourished, well developed in no acute distress HEENT: Normal NECK: No JVD; No carotid bruits LYMPHATICS: No lymphadenopathy CARDIAC:RRR, no murmurs, rubs, gallops RESPIRATORY:  Clear to auscultation without rales,  wheezing or rhonchi  ABDOMEN: Soft, non-tender, non-distended MUSCULOSKELETAL:  No edema; No deformity  SKIN: Warm and dry NEUROLOGIC:  Alert and oriented x 3 PSYCHIATRIC:  Normal affect   Labs    High Sensitivity Troponin:   Recent Labs  Lab 08/05/22 1130 08/05/22 1430 08/05/22 1812 08/05/22 2038 08/12/22 1829  TROPONINIHS 69* 143* 191* 190* 13      Chemistry Recent Labs  Lab 08/09/22 0730 08/10/22 0231 08/12/22 1352 08/12/22 1829 08/13/22 0620  NA 137 140 132*  --  133*  K 3.8 3.8 4.2  --  3.6  CL 106 103 102  --  103  CO2 '23 27 22  '$ --  22  GLUCOSE 93 97 108*  --  87  BUN '13 10 21  '$ --  20  CREATININE 1.06* 0.99 4.07*  --  2.37*  CALCIUM 8.3* 8.8* 7.4*  --  7.6*  MG 1.7 1.8  --  1.5* 1.6*  PROT  --   --  6.1*  --   --   ALBUMIN 3.1* 3.4* 3.6  --   --   AST  --   --  19  --   --  ALT  --   --  11  --   --   ALKPHOS  --   --  57  --   --   BILITOT  --   --  0.4  --   --   GFRNONAA 59* >60 12*  --  23*  ANIONGAP '8 10 8  '$ --  8     Lipids No results for input(s): "CHOL", "TRIG", "HDL", "LABVLDL", "LDLCALC", "CHOLHDL" in the last 168 hours.  Hematology Recent Labs  Lab 08/13/22 0620 08/14/22 0325 08/15/22 0616  WBC 7.0 5.2 5.1  RBC 3.00* 2.90* 3.05*  HGB 8.6* 8.5* 9.0*  HCT 26.2* 25.0* 26.2*  MCV 87.3 86.2 85.9  MCH 28.7 29.3 29.5  MCHC 32.8 34.0 34.4  RDW 15.3 15.3 15.8*  PLT 175 172 176    Thyroid No results for input(s): "TSH", "FREET4" in the last 168 hours.  BNP Recent Labs  Lab 08/10/22 0231 08/12/22 1352 08/12/22 1829  BNP 713.2* 555.6* 497.2*     DDimer No results for input(s): "DDIMER" in the last 168 hours.   Radiology    ECHOCARDIOGRAM COMPLETE  Result Date: 08/13/2022    ECHOCARDIOGRAM REPORT   Patient Name:   [name] Date of Exam: 08/13/2022 Medical Rec #:  [id number]         Height:       71.0 in Accession #:    [id number]        Weight:       203.9 lb Date of Birth:  02-Dec-1961         BSA:          2.126 m Patient Age:     46 years          BP:           93/57 mmHg Patient Gender: F                 HR:           71 bpm. Exam Location:  Inpatient Procedure: 2D Echo, Cardiac Doppler and Color Doppler Indications:    Sinus bradycardia  History:        Patient has prior history of Echocardiogram examinations, most                 recent 07/28/2022. CHF, Mitral Valve Disease, Arrythmias:Atrial                 Fibrillation; Risk Factors:Hypertension and Diabetes.                  Mitral Valve: prosthetic annuloplasty ring valve is present in                 the mitral position. Procedure Date: 04/19/22.  Sonographer:    Eartha Inch Referring Phys: [id number] [place]  1. S/P MV repair with mild MS (1.74 cm2; mean gradient 7 mmHg; HR 70); mild MR.  2. Left ventricular ejection fraction, by estimation, is 60 to 65%. The left ventricle has normal function. The left ventricle has no regional wall motion abnormalities. There is moderate concentric left ventricular hypertrophy. Left ventricular diastolic parameters are indeterminate. Elevated left atrial pressure. There is the interventricular septum is flattened in systole and diastole, consistent with right ventricular pressure and volume overload.  3. Right ventricular systolic function is normal. The right ventricular size is normal. There is mildly elevated pulmonary artery systolic pressure.  4. Left atrial size was moderately dilated.  5.  Right atrial size was mildly dilated.  6. The mitral valve has been repaired/replaced. Mild mitral valve regurgitation. Mild mitral stenosis. 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 Procedure Date: 04/19/22.  7. Tricuspid valve regurgitation is mild to moderate.  8. The aortic valve is tricuspid. Aortic valve regurgitation is not visualized. No aortic stenosis is present.  9. The inferior vena cava is normal in size with greater than 50% respiratory variability, suggesting right atrial pressure of 3  mmHg. FINDINGS  Left Ventricle: Left ventricular ejection fraction, by estimation, is 60 to 65%. The left ventricle has normal function. The left ventricle has no regional wall motion abnormalities. The left ventricular internal cavity size was normal in size. There is  moderate concentric left ventricular hypertrophy. The interventricular septum is flattened in systole and diastole, consistent with right ventricular pressure and volume overload. Left ventricular diastolic parameters are indeterminate. Elevated left atrial pressure. Right Ventricle: The right ventricular size is normal. Right ventricular systolic function is normal. There is mildly elevated pulmonary artery systolic pressure. The tricuspid regurgitant velocity is 3.18 m/s, and with an assumed right atrial pressure of 3 mmHg, the estimated right ventricular systolic pressure is 62.1 mmHg. Left Atrium: Left atrial size was moderately dilated. Right Atrium: Right atrial size was mildly dilated. Pericardium: Trivial pericardial effusion is present. Mitral Valve: The mitral valve has been repaired/replaced. Mild mitral valve regurgitation. 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 Procedure Date: 04/19/22. Mild mitral valve stenosis. MV peak gradient, 14.6 mmHg. The mean  mitral valve gradient is 7.0 mmHg. Tricuspid Valve: The tricuspid valve is normal in structure. Tricuspid valve regurgitation is mild to moderate. No evidence of tricuspid stenosis. Aortic Valve: The aortic valve is tricuspid. Aortic valve regurgitation is not visualized. No aortic stenosis is present. Pulmonic Valve: The pulmonic valve was normal in structure. Pulmonic valve regurgitation is not visualized. No evidence of pulmonic stenosis. Aorta: The aortic root is normal in size and structure. Venous: The inferior vena cava is normal in size with greater than 50% respiratory variability, suggesting right atrial pressure of 3 mmHg. IAS/Shunts: No atrial  level shunt detected by color flow Doppler. Additional Comments: S/P MV repair with mild MS (1.74 cm2; mean gradient 7 mmHg; HR 70); mild MR.  LEFT VENTRICLE PLAX 2D LVIDd:         4.50 cm   Diastology LVIDs:         3.20 cm   LV e' medial:    4.19 cm/s LV PW:         1.30 cm   LV E/e' medial:  48.0 LV IVS:        1.30 cm   LV e' lateral:   6.32 cm/s LVOT diam:     2.00 cm   LV E/e' lateral: 31.8 LV SV:         74 LV SV Index:   35 LVOT Area:     3.14 cm  RIGHT VENTRICLE             IVC RV S prime:     10.00 cm/s  IVC diam: 1.60 cm TAPSE (M-mode): 1.0 cm LEFT ATRIUM             Index        RIGHT ATRIUM           Index LA diam:        4.50 cm 2.12 cm/m   RA Area:  13.80 cm LA Vol (A2C):   61.0 ml 28.69 ml/m  RA Volume:   29.50 ml  13.88 ml/m LA Vol (A4C):   45.2 ml 21.26 ml/m LA Biplane Vol: 54.7 ml 25.73 ml/m  AORTIC VALVE LVOT Vmax:   122.00 cm/s LVOT Vmean:  86.400 cm/s LVOT VTI:    0.234 m  AORTA Ao Root diam: 3.50 cm MITRAL VALVE                TRICUSPID VALVE MV Area (PHT): 1.74 cm     TR Peak grad:   40.4 mmHg MV Area VTI:   1.20 cm     TR Mean grad:   32.0 mmHg MV Peak grad:  14.6 mmHg    TR Vmax:        318.00 cm/s MV Mean grad:  7.0 mmHg     TR Vmean:       274.0 cm/s MV Vmax:       1.91 m/s MV Vmean:      123.0 cm/s   SHUNTS MV Decel Time: 435 msec     Systemic VTI:  0.23 m MR Peak grad: 75.0 mmHg     Systemic Diam: 2.00 cm MR Vmax:      433.00 cm/s MV E velocity: 201.00 cm/s MV A velocity: 121.00 cm/s MV E/A ratio:  1.66 Kirk Ruths MD Electronically signed by Kirk Ruths MD Signature Date/Time: 08/13/2022/12:32:14 PM    Final     2D echo 08/2022 IMPRESSIONS   1. S/P MV repair with mild MS (1.74 cm2; mean gradient 7 mmHg; HR 70);  mild MR.   2. Left ventricular ejection fraction, by estimation, is 60 to 65%. The  left ventricle has normal function. The left ventricle has no regional  wall motion abnormalities. There is moderate concentric left ventricular  hypertrophy. Left  ventricular  diastolic parameters are indeterminate. Elevated left atrial pressure.  There is the interventricular septum is flattened in systole and diastole,  consistent with right ventricular pressure and volume overload.   3. Right ventricular systolic function is normal. The right ventricular  size is normal. There is mildly elevated pulmonary artery systolic  pressure.   4. Left atrial size was moderately dilated.   5. Right atrial size was mildly dilated.   6. The mitral valve has been repaired/replaced. Mild mitral valve  regurgitation. Mild mitral stenosis. There is a prosthetic annuloplasty  ring present in the mitral position. Procedure Date: 04/19/22.   7. Tricuspid valve regurgitation is mild to moderate.   8. The aortic valve is tricuspid. Aortic valve regurgitation is not  visualized. No aortic stenosis is present.   9. The inferior vena cava is normal in size with greater than 50%  respiratory variability, suggesting right atrial pressure of 3 mmHg.   Patient Profile     61 y.o. female admitted with hypotension status post MVR/TVR and chronic pain  Assessment & Plan    Hypotension -Likely related in some part to hypovolemia as her creatinine was 4 on admission and was 0.93 days prior to admission  -this appears resolved this a.m. on midodrine 5 mg 3 times daily but now BP is high - she has had multiple hospitalizations for this. -Now off her losartan 100 mg daily, Lasix 20 mg daily and spironolactone 12.5 mg daily -Apparently has been asking for a lot of pain medication so there is some concern this may be contributing to her hypotension -A.m.cortisol low at 6.1 with lower limit of normal 6.7  but ACTH stim test is normal -stop midodrine -suspect hypotension related to dehydration based on SCr bump from normal to 4 and also related to chronic pain meds  History of pericardial effusion - No evidence of pericardial effusion on 2D echo this admission  MVR/TVR -TEE  reviewed from 07/2022.  5 mmHg mean gradient across mitral valve.   --2D echo this admission showed mild mitral regurgitation and mild to moderate mitral stenosis in the setting of prosthetic annuloplasty ring with mean mitral valve gradient 7 mmHg>> reassuring that this is not resulting in hypotension  AKI - Hypotension/hypovolemia - Creatinine 4 on admission and now improved to 2.37 on 9/8>>repeat BMET today   -- 5 days ago was 0.9. -- Bmet pending this a.m. -- continue to hold diuretics  Chronic pain -Patient was hypotensive yesterday 60/40.   --Apparently has been asking for pain medication a lot  --This may also be contributing to her hypotension.  PAF -maintaining NSR -currently on IV Heparin gtt -TRH restarted warfarin  HTN -BP now on the high side -stop Midodrine -restart Lopressor '25mg'$  BID -would not restart ARB or spiro for now due to AKI>>can restart outpt once renal function normalizes   [facility] will sign off.   Medication Recommendations:  Atorvastatin '20mg'$  daily, Lopressor '25mg'$  BID, warfarin Other recommendations (labs, testing, etc):  BMET in 1 week Follow up as an outpatient:  Dr. Gasper Sells 1 week   For questions or updates, please contact [place] Please consult [email or web address] for contact info under        Signed, Fransico Him, MD  08/15/2022, 10:21 AM

## 2022-08-16 DIAGNOSIS — J9601 Acute respiratory failure with hypoxia: Secondary | ICD-10-CM | POA: Diagnosis not present

## 2022-08-16 DIAGNOSIS — I951 Orthostatic hypotension: Secondary | ICD-10-CM | POA: Diagnosis not present

## 2022-08-16 DIAGNOSIS — N179 Acute kidney failure, unspecified: Secondary | ICD-10-CM | POA: Diagnosis not present

## 2022-08-16 DIAGNOSIS — I5023 Acute on chronic systolic (congestive) heart failure: Secondary | ICD-10-CM | POA: Diagnosis not present

## 2022-08-16 LAB — BASIC METABOLIC PANEL
Anion gap: 9 (ref 5–15)
BUN: 6 mg/dL — ABNORMAL LOW (ref 8–23)
CO2: 20 mmol/L — ABNORMAL LOW (ref 22–32)
Calcium: 8.9 mg/dL (ref 8.9–10.3)
Chloride: 108 mmol/L (ref 98–111)
Creatinine, Ser: 1.04 mg/dL — ABNORMAL HIGH (ref 0.44–1.00)
GFR, Estimated: >60 mL/min (ref >60)
Glucose, Bld: 77 mg/dL (ref 70–99)
Potassium: 4.5 mmol/L (ref 3.5–5.1)
Sodium: 137 mmol/L (ref 135–145)

## 2022-08-16 LAB — CBC
HCT: 26.5 % — ABNORMAL LOW (ref 36.0–46.0)
Hemoglobin: 9.1 g/dL — ABNORMAL LOW (ref 12.0–15.0)
MCH: 29.1 pg (ref 26.0–34.0)
MCHC: 34.3 g/dL (ref 30.0–36.0)
MCV: 84.7 fL (ref 80.0–100.0)
Platelets: 212 10*3/uL (ref 150–400)
RBC: 3.13 MIL/uL — ABNORMAL LOW (ref 3.87–5.11)
RDW: 15.5 % (ref 11.5–15.5)
WBC: 5.6 10*3/uL (ref 4.0–10.5)
nRBC: 0 % (ref 0.0–0.2)

## 2022-08-16 LAB — PROTIME-INR
INR: 1.5 — ABNORMAL HIGH (ref 0.8–1.2)
Prothrombin Time: 17.7 seconds — ABNORMAL HIGH (ref 11.4–15.2)

## 2022-08-16 LAB — HEPARIN LEVEL (UNFRACTIONATED): Heparin Unfractionated: 0.53 IU/mL (ref 0.30–0.70)

## 2022-08-16 MED ORDER — ATORVASTATIN CALCIUM 10 MG PO TABS
20.0000 mg | ORAL_TABLET | Freq: Every day | ORAL | Status: DC
  Administered 2022-08-16 – 2022-08-17 (×2): 20 mg via ORAL
  Filled 2022-08-16 (×2): qty 2

## 2022-08-16 MED ORDER — WARFARIN SODIUM 3 MG PO TABS
6.0000 mg | ORAL_TABLET | Freq: Once | ORAL | Status: AC
Start: 2022-08-16 — End: 2022-08-16
  Administered 2022-08-16: 6 mg via ORAL
  Filled 2022-08-16: qty 2

## 2022-08-16 MED ORDER — LOSARTAN POTASSIUM 25 MG PO TABS
25.0000 mg | ORAL_TABLET | Freq: Every day | ORAL | Status: DC
  Administered 2022-08-16 – 2022-08-17 (×2): 25 mg via ORAL
  Filled 2022-08-16 (×2): qty 1

## 2022-08-16 MED ORDER — AMLODIPINE BESYLATE 10 MG PO TABS
10.0000 mg | ORAL_TABLET | Freq: Every day | ORAL | Status: DC
  Administered 2022-08-16 – 2022-08-17 (×2): 10 mg via ORAL
  Filled 2022-08-16 (×2): qty 1

## 2022-08-16 NOTE — Plan of Care (Signed)
  Problem: Pain Managment: Goal: General experience of comfort will improve Outcome: Progressing   Problem: Safety: Goal: Ability to remain free from injury will improve Outcome: Progressing   

## 2022-08-16 NOTE — Care Management Important Message (Signed)
Important Message  Patient Details  Name: [name] MRN: [id number] Date of Birth: May 25, 1961   Medicare Important Message Given:  Yes     [name] 08/16/2022, 12:07 PM

## 2022-08-16 NOTE — Progress Notes (Addendum)
ANTICOAGULATION CONSULT NOTE - Follow Up Consult  Pharmacy Consult for Heparin bridge to Warfarin Indication: atrial fibrillation  Allergies  Allergen Reactions   Norco [Hydrocodone-Acetaminophen] Itching and Other (See Comments)    Tolerates Oxycodone    Patient Measurements: Height: '5\' 11"'$  (180.3 cm) Weight: 89.9 kg (198 lb 3.2 oz) IBW/kg (Calculated) : 70.8 Heparin Dosing Weight: 90.2 kg  Vital Signs: Temp: 98.4 F (36.9 C) (09/11 0539) Temp Source: Oral (09/11 0539) BP: 154/118 (09/11 0539) Pulse Rate: 100 (09/11 0539)  Labs: Recent Labs    08/14/22 0325 08/14/22 1429 08/14/22 1747 08/15/22 0616 08/16/22 0423  HGB 8.5*  --   --  9.0* 9.1*  HCT 25.0*  --   --  26.2* 26.5*  PLT 172  --   --  176 212  LABPROT  --  17.0*  --  16.4* 17.7*  INR  --  1.4*  --  1.3* 1.5*  HEPARINUNFRC 0.86*  --  0.59 0.59 0.53  CREATININE  --   --   --  0.84 1.04*     Estimated Creatinine Clearance: 69.4 mL/min (A) (by C-G formula based on SCr of 1.04 mg/dL (H)).   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  Admitted  08/12/22 due to hypotension and AKI.  Pharmacy consulted to continue [facility] with heparin. INR on admission 08/12/22 was 1.7  Last dose of warfarin was 9/6. Restart 9/9 per team. Noted patient has been noncompliant with outpatient INR follow ups and missed doses due to not picking up refill in late June. Last [facility] visit 07/02/22.   PTA -Warfarin 5 mg daily except 7.5 mg on Sundays. LD pta 9/6 @ 11am  08/16/22: Heparin level 0.53 therapeutic on heparin 1100 units/hr. CBC stable. INR 1.5, after 7.5 mg on 9/9 and 7.5 mg on 9/10.    Goal of Therapy:  Heparin level 0.3-0.7 units/ml INR = 2-3 Monitor platelets by anticoagulation protocol: Yes   Plan:  Continue Heparin 1,100 units/hr until  INR therapeutic  Warfarin 6 mg x1 today Daily heparin level, INR, and CBC  Monitor s/sx bleeding     Nicole Cella, RPh Clinical Pharmacist [phone] 08/16/2022  9:56 AM   Please check AMION for all [place] phone numbers After 10:00 PM, call [place] [phone]

## 2022-08-16 NOTE — Progress Notes (Signed)
   08/16/22 1020  Mobility  Activity Ambulated with assistance in hallway  Level of Assistance Standby assist, set-up cues, supervision of patient - no hands on  Assistive Device Front wheel walker  Distance Ambulated (ft) 490 ft  Activity Response Tolerated well  $Mobility charge 1 Mobility   Mobility Specialist Progress Note  Pre-Mobility: 154/118 BP During Mobility: 92% SpO2  Received pt in bed having no complaints and agreeable to mobility. Pt was asymptomatic throughout ambulation and returned to room w/o fault. Left in bed w/ call bell in reach and all needs met.   [name] Mobility Specialist

## 2022-08-16 NOTE — Progress Notes (Signed)
Progress Note   Patient: [name] EVO:[id number] DOB: 07/28/1961 DOA: 08/12/2022     4 DOS: the patient was seen and examined on 08/16/2022   Brief hospital course: [name] was admitted to [facility] hospital with hypotension and shock, likely hypovolemic.   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 Patient with multiple hospitalizations due to bradycardia and hypotension, possible medication induced. Last hospitalization 08/31 to 08/10/22 acute on chronic heart failure with respiratory failure.  Patient reported multiple near syncope episodes at home, and orthostatic symptoms. She was evaluated by cardiology as outpatient and found hypotensive, she was referred to the ED for further evaluation.  On her initial physical examination her blood pressure was 82/42, 70/42, HR 52, RR 15, 02 saturation 86% on room air. Lungs were clear to auscultation, heart with S1 and S2 present and rhythmic, abdomen not distended and not tender, no lower extremity edema, she was awake and alert.   Na 132, K 4,2 CL 102, bicarbonate 22, glucose 108, bun 21 cr 4,0 BNP 555 Lactic acid 2,3  Wbc 10,4 hgb 8,6 plt 194  Sars covid 19 negative  Blood cultures with no growth.   Chest radiograph with mild cardiomegaly, mild bilateral hilar vascular congestion, no infiltrates and no effusions.   EKG 56 bpm, normal axis, qtc 513, sinus rhythm with no significant ST segment changes, negative T wave in all leads.   Hypotension refractive to IV fluids.  Patient was paced on vasopressors and admitted to the intensive care unit.  Started on midodrine and able to wean off vasopressor (norepinephrine infusion).   09/09 transferred to [facility].  09/10 blood pressure elevated, discontinue midodrine and resumed on antihypertensive  medications  09/12 continue to adjust antihypertensive regimen.   Assessment and Plan: * Hypotension Likely multifactorial, including medication and volume induced.  ACTH stimulation test was negative for adrenal insufficiency.   Hypertension. Blood pressure has improved, but continue with elevated diastolic.  Will continue with amlodipine and metoprolol. Now that her renal function has improved will resume low dose ARB.  Continue close inpatient blood pressure monitoring.   Acute kidney injury (nontraumatic) (HCC) Hyponatremia. Hypomagnesemia.   Renal function has improved with serum cr at 1,0 with K at 4,5 and serum bicarbonate at 20 Continue close follow up on renal function and electrolytes.   Acute on chronic systolic CHF (congestive heart failure) (HCC) Recovered systolic function.  Echocardiogram with preserved LV systolic function with EF 60 to 65%, moderate LVH, interventricular septum flattened in systole and diastole, preserved RV systolic function. Moderate dilatation of LA. TR mild to moderate.   Continue blood pressure control with amlodipine, metoprolol and losartan. Hold on diuretic therapy for now. Patient clinically euvolemic.   Acute respiratory failure with hypoxia (HCC)-resolved as of 08/16/2022 Respiratory failure has resolved.   Paroxysmal atrial fibrillation (HCC) Heart rate has been controlled, Transition to warfarin from heparin for anticoagulation  Valvular heart disease.  INR is 1,5 today  Type 2 diabetes mellitus with hyperlipidemia (HCC) Continue glucose cover and monitoring with insulin sliding scale. Patient is tolerating po well.   Continue with statin therapy.   Anxiety and depression Continue with bupropion, aripirazole, escitalopram, and trazodone.         Subjective: Patient with no chest pain or dyspnea,   Physical Exam: Vitals:   08/15/22 1527 08/15/22 1937 08/16/22 0500 08/16/22 0539  BP: (!) 163/92 132/83  (!) 154/118   Pulse:  81  100  Resp: '15 18  13  '$ Temp: 98.1 F (36.7 C) 98.2 F (36.8 C)  98.4 F (36.9 C)  TempSrc: Oral   Oral  SpO2: 97% 100%  100%  Weight:   89.9 kg   Height:       Neurology awake and alert ENT with no pallor Cardiovascular with S1 and S2 present and rhythmic with no gallops Respiratory with no rales or wheezing Abdomen with no distention  Data Reviewed:    Family Communication: no family at the bedside   Disposition: Status is: Inpatient Remains inpatient appropriate because: uncontrolled hypertension   Planned Discharge Destination: Home     Author: Tawni Millers, MD 08/16/2022 10:48 AM  For on call review [email or web address].

## 2022-08-17 ENCOUNTER — Other Ambulatory Visit (HOSPITAL_COMMUNITY): Payer: Self-pay

## 2022-08-17 ENCOUNTER — Ambulatory Visit: Payer: Medicare Other | Admitting: Internal Medicine

## 2022-08-17 DIAGNOSIS — N179 Acute kidney failure, unspecified: Secondary | ICD-10-CM | POA: Diagnosis not present

## 2022-08-17 DIAGNOSIS — I951 Orthostatic hypotension: Secondary | ICD-10-CM | POA: Diagnosis not present

## 2022-08-17 DIAGNOSIS — I5023 Acute on chronic systolic (congestive) heart failure: Secondary | ICD-10-CM | POA: Diagnosis not present

## 2022-08-17 DIAGNOSIS — J9601 Acute respiratory failure with hypoxia: Secondary | ICD-10-CM | POA: Diagnosis not present

## 2022-08-17 LAB — CULTURE, BLOOD (ROUTINE X 2)
Culture: NO GROWTH
Culture: NO GROWTH
Special Requests: ADEQUATE

## 2022-08-17 LAB — PROTIME-INR
INR: 1.9 — ABNORMAL HIGH (ref 0.8–1.2)
Prothrombin Time: 21.6 seconds — ABNORMAL HIGH (ref 11.4–15.2)

## 2022-08-17 LAB — CBC
HCT: 28.4 % — ABNORMAL LOW (ref 36.0–46.0)
Hemoglobin: 9.7 g/dL — ABNORMAL LOW (ref 12.0–15.0)
MCH: 28.9 pg (ref 26.0–34.0)
MCHC: 34.2 g/dL (ref 30.0–36.0)
MCV: 84.5 fL (ref 80.0–100.0)
Platelets: 201 10*3/uL (ref 150–400)
RBC: 3.36 MIL/uL — ABNORMAL LOW (ref 3.87–5.11)
RDW: 15.4 % (ref 11.5–15.5)
WBC: 5.7 10*3/uL (ref 4.0–10.5)
nRBC: 0 % (ref 0.0–0.2)

## 2022-08-17 LAB — HEPARIN LEVEL (UNFRACTIONATED): Heparin Unfractionated: 0.48 IU/mL (ref 0.30–0.70)

## 2022-08-17 MED ORDER — LOSARTAN POTASSIUM 25 MG PO TABS
25.0000 mg | ORAL_TABLET | Freq: Every day | ORAL | 0 refills | Status: DC
  Filled 2022-08-17: qty 30, 30d supply, fill #0

## 2022-08-17 MED ORDER — AMLODIPINE BESYLATE 10 MG PO TABS
10.0000 mg | ORAL_TABLET | Freq: Every day | ORAL | 0 refills | Status: DC
  Filled 2022-08-17: qty 30, 30d supply, fill #0

## 2022-08-17 NOTE — TOC Transition Note (Addendum)
Transition of Care [facility]) - CM/SW Discharge Note   Patient Details  Name: [name] MRN: [id number] Date of Birth: 12/01/1961  Transition of [facility] CM/SW Contact:  Zenon Mayo, RN Phone Number: 08/17/2022, 11:19 AM   Clinical Narrative:    Patient is for dc today, NCM offered choice, for [facility], she states she is still active with [facility] for [facility] and would like to continue with them.  She states she will also need transport to her car.  She is also on home oxygen from previous admit.  Patient states the MD told her she does not need oxygen, she has not been on oxygen on unit, per Staff RN Judson Roch she is satting 97% room air.  TOC to fill meds.   Final next level of care: [place] Barriers to Discharge: No Barriers Identified   Patient Goals and CMS Choice Patient states their goals for this hospitalization and ongoing recovery are:: return home CMS Medicare.gov Compare Post Acute Care list provided to:: Patient Choice offered to / list presented to : Patient  Discharge Placement                       Discharge Plan and Services                  DME Agency: NA       HH Arranged: RN, Disease Management [place] Agency: [place] Date [place]: 08/17/22 Time [place]: 1118 Representative spoke with at [place]: [place] ([place]) Interventions     Readmission Risk Interventions    08/06/2022    2:13 PM  Readmission Risk Prevention Plan  Transportation Screening Complete  Medication Review Press photographer) Complete  PCP or Specialist appointment within 3-5 days of discharge Complete  HRI or [place] Complete  SW Recovery Care/Counseling Consult Complete  [place] Not Applicable

## 2022-08-17 NOTE — Progress Notes (Signed)
ANTICOAGULATION CONSULT NOTE - Follow Up Consult  Pharmacy Consult for Heparin bridge to Warfarin Indication: atrial fibrillation  Allergies  Allergen Reactions   Norco [Hydrocodone-Acetaminophen] Itching and Other (See Comments)    Tolerates Oxycodone    Patient Measurements: Height: '5\' 11"'$  (180.3 cm) Weight: 86.4 kg (190 lb 8 oz) IBW/kg (Calculated) : 70.8 Heparin Dosing Weight: 90.2 kg  Vital Signs: Temp: 98 F (36.7 C) (09/12 0757) Temp Source: Oral (09/12 0757) BP: 148/98 (09/12 0757) Pulse Rate: 94 (09/12 0757)  Labs: Recent Labs    08/15/22 0616 08/16/22 0423 08/17/22 0503  HGB 9.0* 9.1* 9.7*  HCT 26.2* 26.5* 28.4*  PLT 176 212 201  LABPROT 16.4* 17.7* 21.6*  INR 1.3* 1.5* 1.9*  HEPARINUNFRC 0.59 0.53 0.48  CREATININE 0.84 1.04*  --      Estimated Creatinine Clearance: 68.2 mL/min (A) (by C-G formula based on SCr of 1.04 mg/dL (H)).   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  Admitted  08/12/22 due to hypotension and AKI.  Pharmacy consulted to continue [facility] with heparin. INR on admission 08/12/22 was 1.7  Last dose of warfarin was 9/6. Restart 9/9 per team. Noted patient has been noncompliant with outpatient INR follow ups and missed doses due to not picking up refill in late June. Last [facility] visit 07/02/22.   PTA -Warfarin 5 mg daily except 7.5 mg on Sundays. LD pta 9/6 @ 11am  08/17/22: Heparin level 0.48 therapeutic on heparin 1100 units/hr. CBC stable with Hgb 9.7, pltc 201k. No bleeding reported.  INR 1.9 today.  Goal INR 2-3 (confirmed with outpatient anticoagulation visit notes)    Goal of Therapy:  Heparin level 0.3-0.7 units/ml INR = 2-3 Monitor platelets by anticoagulation protocol: Yes   Plan:  Continue Heparin 1,100 units/hr  Warfarin '5mg'$  today x1 if not  discharged today Possible discharge today. Recommend resume PTA dose Warfarin 5 mg daily except 7.5 mg every Sunday for Goal INR 2-3. Recommend INR check by early next week.   Daily heparin level, INR, and CBC if remains inpatient. Monitor s/sx bleeding     Nicole Cella, [place] Clinical Pharmacist [phone] 08/17/2022  9:33 AM   Please check AMION for all [place] phone numbers After 10:00 PM, call [place] [phone]

## 2022-08-17 NOTE — Plan of Care (Signed)

## 2022-08-17 NOTE — Discharge Summary (Addendum)
Physician Discharge Summary   Patient: [name] MRN: [id number] DOB: December 29, 1960  Admit date:     08/12/2022  Discharge date: 08/17/22  Discharge Physician: Tawni Millers   PCP: [place]   Recommendations at discharge:    Patient will continue anticoagulation with warfarin, her discharge INR is 1,9, follow up INR next week.  Blood pressure control with metoprolol, amlodipine and losartan Holding on loop diuretic and spironolactone therapy for now. Follow up with primary care in 7 days Follow up renal functio and electrolytes in 7 days Follow up with cardiology in as scheduled.    Discharge Diagnoses: Principal Problem:   Hypotension Active Problems:   Acute kidney injury (nontraumatic) (HCC)   Acute on chronic systolic CHF (congestive heart failure) (HCC)   Paroxysmal atrial fibrillation (HCC)   Type 2 diabetes mellitus with hyperlipidemia (HCC)   Anxiety and depression  Resolved Problems:   Acute respiratory failure with hypoxia [facility])  Hospital Course: Mrs. Goodpasture was admitted to [facility] hospital with hypotension and shock, likely hypovolemic.   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 Patient with multiple hospitalizations due to bradycardia and hypotension, possible medication induced. Last hospitalization 08/31 to 08/10/22 acute on chronic heart failure with respiratory failure.  Patient reported multiple near syncope episodes at home, and orthostatic symptoms. She was evaluated by cardiology as outpatient and found hypotensive, she was referred to the ED for further evaluation.  On her initial physical examination her blood pressure was 82/42, 70/42, HR 52, RR 15, 02 saturation 86% on room air. Lungs were clear to auscultation, heart with S1 and S2  present and rhythmic, abdomen not distended and not tender, no lower extremity edema, she was awake and alert.   Na 132, K 4,2 CL 102, bicarbonate 22, glucose 108, bun 21 cr 4,0 BNP 555 Lactic acid 2,3  Wbc 10,4 hgb 8,6 plt 194  Sars covid 19 negative  Blood cultures with no growth.   Chest radiograph with mild cardiomegaly, mild bilateral hilar vascular congestion, no infiltrates and no effusions.   EKG 56 bpm, normal axis, qtc 513, sinus rhythm with no significant ST segment changes, negative T wave in all leads.   Hypotension refractive to IV fluids.  Patient was paced on vasopressors and admitted to the intensive care unit.  Started on midodrine and able to wean off vasopressor (norepinephrine infusion).   09/09 transferred to [facility].  09/10 blood pressure elevated, discontinue midodrine and resumed on antihypertensive medications  09/12 continue to adjust antihypertensive regimen.  09/13 improved blood pressure control, patient will go home and will need a close follow up as outpatient.   Assessment and Plan: * Hypotension Likely multifactorial, including medication and volume induced.  ACTH stimulation test was negative for adrenal insufficiency.   Hypertension. Her blood pressure has improved with amlodipine, metoprolol and low dose losartan. Her blood pressure at the time of discharge is 292 to 446 systolic with diastolic 81 to 98. Continue further blood pressure adjustments as outpatient, she will benefit from ambulatory blood pressure monitoring.   Acute kidney injury (nontraumatic) (HCC) Hyponatremia. Hypomagnesemia.   Her renal function and electrolytes have improved. Hold on diuretic therapy for now. At the time of her discharge her serum cr is 1,0 with K at 4,5 and serum bicarbonate at 20. Follow up renal function as outpatient in 7 days.  Acute on chronic systolic CHF (congestive heart failure) (HCC) Recovered systolic function.  Echocardiogram with preserved LV  systolic function with EF 60 to 65%, moderate LVH, interventricular septum flattened in systole and diastole, preserved RV systolic function. Moderate dilatation of LA. TR mild to moderate.   Continue blood pressure control with amlodipine, metoprolol and losartan. Hold on furosemide and spironolactone therapy for now. Patient clinically euvolemic, resume empagliflozin   Acute respiratory failure with hypoxia (HCC)-resolved as of 08/16/2022 Respiratory failure has resolved.   Paroxysmal atrial fibrillation (HCC) Valvular atrial fibrillation.  Heart rate has been controlled, with metoprolol.  During her acute illness she was placed on heparin and warfarin was held. Eventually warfarin has been resumed with good toleration.   INR is 1,9 today  Type 2 diabetes mellitus with hyperlipidemia (HCC) Her glucose remained stable, she was placed on insulin sliding scale during her hospitalization for glucose cover and monitoring.  Resume empagliflozin and linagliptin  Continue with statin therapy.   Anxiety and depression Continue with bupropion, aripirazole, escitalopram, and trazodone.          Consultants: cardiology  Procedures performed: none   Disposition: Home Diet recommendation:  Cardiac and Carb modified diet DISCHARGE MEDICATION: Allergies as of 08/17/2022       Reactions   Norco [hydrocodone-acetaminophen] Itching, Other (See Comments)   Tolerates Oxycodone        Medication List     STOP taking these medications    furosemide 20 MG tablet Commonly known as: LASIX   spironolactone 25 MG tablet Commonly known as: ALDACTONE       TAKE these medications    acetaminophen 500 MG tablet Commonly known as: TYLENOL Take 1,000 mg by mouth every 6 (six) hours as needed for mild pain.   albuterol 108 (90 Base) MCG/ACT inhaler Commonly known as: VENTOLIN HFA Inhale 2 puffs into the lungs every 6 (six) hours as needed for wheezing or shortness of breath.    amLODipine 10 MG tablet Commonly known as: NORVASC Take 1 tablet (10 mg total) by mouth daily. Start taking on: August 18, 2022   ARIPiprazole 5 MG tablet Commonly known as: ABILIFY Take 2.5 mg by mouth daily.   atorvastatin 20 MG tablet Commonly known as: LIPITOR Take 20 mg by mouth daily.   buPROPion 300 MG 24 hr tablet Commonly known as: WELLBUTRIN XL Take 300 mg by mouth in the morning.   dexlansoprazole 60 MG capsule Commonly known as: DEXILANT Take 1 capsule by mouth daily.   escitalopram 10 MG tablet Commonly known as: LEXAPRO Take 10 mg by mouth every morning.   Farxiga 10 MG Tabs tablet Generic drug: dapagliflozin propanediol Take 10 mg by mouth daily.   gabapentin 600 MG tablet Commonly known as: NEURONTIN Take 0.5 tablets (300 mg total) by mouth 3 (three) times daily.   linagliptin 5 MG Tabs tablet Commonly known as: TRADJENTA Take 5 mg by mouth daily.   losartan 25 MG tablet Commonly known as: COZAAR Take 1 tablet (25 mg total) by mouth daily. Start taking on: August 18, 2022 What changed:  medication strength how much to take additional instructions   metoprolol tartrate 50 MG tablet Commonly known as: LOPRESSOR Take 25 mg by mouth 2 (two) times daily.   naloxone 4 MG/0.1ML Liqd nasal spray kit Commonly known as: NARCAN Place 1 spray into the nose as needed (accidental overdose).   neomycin-polymyxin b-dexamethasone 3.5-10000-0.1 Susp Commonly known as: MAXITROL Place 1 drop into both eyes daily at 12  noon.   nicotine 14 mg/24hr patch Commonly known as: NICODERM CQ - dosed in mg/24 hours Place 1 patch (14 mg total) onto the skin daily.   omeprazole 10 MG capsule Commonly known as: PRILOSEC Take 10 mg by mouth daily.   oxyCODONE 15 MG immediate release tablet Commonly known as: ROXICODONE Take 0.5 tablets (7.5 mg total) by mouth every 12 (twelve) hours as needed for pain. What changed: when to take this   Premarin 0.45 MG  tablet Generic drug: estrogens (conjugated) Take 0.45 mg by mouth daily.   traZODone 100 MG tablet Commonly known as: DESYREL Take 1 tablet (100 mg total) by mouth at bedtime as needed for sleep.   warfarin 5 MG tablet Commonly known as: COUMADIN Take as directed. If you are unsure how to take this medication, talk to your nurse or doctor. Original instructions: TAKE 1 TABLET BY MOUTH DAILY OR AS DIRECTED BY ANTICOAGULATION CLINIC. What changed: See the new instructions.        Follow-up Information     Werner Lean, MD Follow up.   Specialty: Cardiology Why: Please keep visit with Dr. Gasper Sells on 08/17/2022 at 9:20am. Contact information: [place] [address] [phone]                Discharge Exam: Danley Danker Weights   08/15/22 0614 08/16/22 0500 08/17/22 0610  Weight: 91.6 kg 89.9 kg 86.4 kg   BP (!) 148/98 (BP Location: Right Arm)   Pulse 94   Temp 98 F (36.7 C) (Oral)   Resp 18   Ht 5' 11"  (1.803 m)   Wt 86.4 kg   LMP 12/06/1976   SpO2 97%   BMI 26.57 kg/m   Patient is feeling well with no chest pain, no dyspnea or edema  Neurology awake and alert ENT with no pallor Cardiovascular with S1 and S2 present and rhythmic with no gallops or murmurs No JVD Respiratory with no rales or wheezing Abdomen with no distention  No lower extremity edema  Condition at discharge: stable  The results of significant diagnostics from this hospitalization (including imaging, microbiology, ancillary and laboratory) are listed below for reference.   Imaging Studies: ECHOCARDIOGRAM COMPLETE  Result Date: 08/13/2022    ECHOCARDIOGRAM REPORT   Patient Name:   LOELLA HICKLE Date of Exam: 08/13/2022 Medical Rec #:  [id number]         Height:       71.0 in Accession #:    [id number]        Weight:       203.9 lb Date of Birth:  [birth year]         BSA:          2.126 m Patient Age:    18 years          BP:           93/57 mmHg  Patient Gender: F                 HR:           71 bpm. Exam Location:  Inpatient Procedure: 2D Echo, Cardiac Doppler and Color Doppler Indications:    Sinus bradycardia  History:        Patient has prior history of Echocardiogram examinations, most                 recent 07/28/2022. CHF, Mitral Valve Disease, Arrythmias:Atrial  Fibrillation; Risk Factors:Hypertension and Diabetes.                  Mitral Valve: prosthetic annuloplasty ring valve is present in                 the mitral position. Procedure Date: 04/19/22.  Sonographer:    Eartha Inch Referring Phys: [id number] [place]  1. S/P MV repair with mild MS (1.74 cm2; mean gradient 7 mmHg; HR 70); mild MR.  2. Left ventricular ejection fraction, by estimation, is 60 to 65%. The left ventricle has normal function. The left ventricle has no regional wall motion abnormalities. There is moderate concentric left ventricular hypertrophy. Left ventricular diastolic parameters are indeterminate. Elevated left atrial pressure. There is the interventricular septum is flattened in systole and diastole, consistent with right ventricular pressure and volume overload.  3. Right ventricular systolic function is normal. The right ventricular size is normal. There is mildly elevated pulmonary artery systolic pressure.  4. Left atrial size was moderately dilated.  5. Right atrial size was mildly dilated.  6. The mitral valve has been repaired/replaced. Mild mitral valve regurgitation. Mild mitral stenosis. 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 Procedure Date: 04/19/22.  7. Tricuspid valve regurgitation is mild to moderate.  8. The aortic valve is tricuspid. Aortic valve regurgitation is not visualized. No aortic stenosis is present.  9. The inferior vena cava is normal in size with greater than 50% respiratory variability, suggesting right atrial pressure of 3 mmHg. FINDINGS  Left Ventricle: Left  ventricular ejection fraction, by estimation, is 60 to 65%. The left ventricle has normal function. The left ventricle has no regional wall motion abnormalities. The left ventricular internal cavity size was normal in size. There is  moderate concentric left ventricular hypertrophy. The interventricular septum is flattened in systole and diastole, consistent with right ventricular pressure and volume overload. Left ventricular diastolic parameters are indeterminate. Elevated left atrial pressure. Right Ventricle: The right ventricular size is normal. Right ventricular systolic function is normal. There is mildly elevated pulmonary artery systolic pressure. The tricuspid regurgitant velocity is 3.18 m/s, and with an assumed right atrial pressure of 3 mmHg, the estimated right ventricular systolic pressure is 95.3 mmHg. Left Atrium: Left atrial size was moderately dilated. Right Atrium: Right atrial size was mildly dilated. Pericardium: Trivial pericardial effusion is present. Mitral Valve: The mitral valve has been repaired/replaced. Mild mitral valve regurgitation. 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 Procedure Date: 04/19/22. Mild mitral valve stenosis. MV peak gradient, 14.6 mmHg. The mean  mitral valve gradient is 7.0 mmHg. Tricuspid Valve: The tricuspid valve is normal in structure. Tricuspid valve regurgitation is mild to moderate. No evidence of tricuspid stenosis. Aortic Valve: The aortic valve is tricuspid. Aortic valve regurgitation is not visualized. No aortic stenosis is present. Pulmonic Valve: The pulmonic valve was normal in structure. Pulmonic valve regurgitation is not visualized. No evidence of pulmonic stenosis. Aorta: The aortic root is normal in size and structure. Venous: The inferior vena cava is normal in size with greater than 50% respiratory variability, suggesting right atrial pressure of 3 mmHg. IAS/Shunts: No atrial level shunt detected by color flow  Doppler. Additional Comments: S/P MV repair with mild MS (1.74 cm2; mean gradient 7 mmHg; HR 70); mild MR.  LEFT VENTRICLE PLAX 2D LVIDd:         4.50 cm   Diastology LVIDs:  3.20 cm   LV e' medial:    4.19 cm/s LV PW:         1.30 cm   LV E/e' medial:  48.0 LV IVS:        1.30 cm   LV e' lateral:   6.32 cm/s LVOT diam:     2.00 cm   LV E/e' lateral: 31.8 LV SV:         74 LV SV Index:   35 LVOT Area:     3.14 cm  RIGHT VENTRICLE             IVC RV S prime:     10.00 cm/s  IVC diam: 1.60 cm TAPSE (M-mode): 1.0 cm LEFT ATRIUM             Index        RIGHT ATRIUM           Index LA diam:        4.50 cm 2.12 cm/m   RA Area:     13.80 cm LA Vol (A2C):   61.0 ml 28.69 ml/m  RA Volume:   29.50 ml  13.88 ml/m LA Vol (A4C):   45.2 ml 21.26 ml/m LA Biplane Vol: 54.7 ml 25.73 ml/m  AORTIC VALVE LVOT Vmax:   122.00 cm/s LVOT Vmean:  86.400 cm/s LVOT VTI:    0.234 m  AORTA Ao Root diam: 3.50 cm MITRAL VALVE                TRICUSPID VALVE MV Area (PHT): 1.74 cm     TR Peak grad:   40.4 mmHg MV Area VTI:   1.20 cm     TR Mean grad:   32.0 mmHg MV Peak grad:  14.6 mmHg    TR Vmax:        318.00 cm/s MV Mean grad:  7.0 mmHg     TR Vmean:       274.0 cm/s MV Vmax:       1.91 m/s MV Vmean:      123.0 cm/s   SHUNTS MV Decel Time: 435 msec     Systemic VTI:  0.23 m MR Peak grad: 75.0 mmHg     Systemic Diam: 2.00 cm MR Vmax:      433.00 cm/s MV E velocity: 201.00 cm/s MV A velocity: 121.00 cm/s MV E/A ratio:  1.66 Kirk Ruths MD Electronically signed by Kirk Ruths MD Signature Date/Time: 08/13/2022/12:32:14 PM    Final    US RENAL  Result Date: 08/12/2022 CLINICAL DATA:  Acute kidney injury EXAM: RENAL / URINARY TRACT ULTRASOUND COMPLETE COMPARISON:  None Available. FINDINGS: Right Kidney: Renal measurements: 10.2 by 4.2 x 4.4 cm = volume: 98.3 mL. Echogenicity within normal limits. No mass or hydronephrosis visualized. Left Kidney: Renal measurements: 9.5 x 5.8 x 3.9 cm = volume: 110.3 mL. Echogenicity within  normal limits. No mass or hydronephrosis visualized. Bladder: Appears normal for degree of bladder distention. Other: None. IMPRESSION: Negative renal ultrasound Electronically Signed   By: Donavan Foil M.D.   On: 08/12/2022 18:54   DG Chest 2 View  Result Date: 08/12/2022 CLINICAL DATA:  Shortness of breath and lightheaded. EXAM: CHEST - 2 VIEW COMPARISON:  08/06/2022 FINDINGS: The cardiac silhouette, mediastinal and hilar contours are within normal limits and stable. 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 The lungs are clear of an acute process. No infiltrates, edema or effusions. No pulmonary lesions. The bony thorax  is intact. IMPRESSION: No acute cardiopulmonary findings. Electronically Signed   By: Marijo Sanes M.D.   On: 08/12/2022 14:22   DG Chest 2 View  Result Date: 08/06/2022 CLINICAL DATA:  Short of breath. EXAM: CHEST - 2 VIEW COMPARISON:  08/05/2022 FINDINGS: Previous median sternotomy and cardiac valve prosthesis. Heart size appears normal. Small bilateral pleural effusions. Atelectasis noted in the right lung base. No interstitial edema or airspace opacities. No signs of pneumothorax. IMPRESSION: 1. Small bilateral pleural effusions with right base atelectasis. Electronically Signed   By: Kerby Moors M.D.   On: 08/06/2022 06:38   DG Chest Port 1 View  Result Date: 08/05/2022 CLINICAL DATA:  Dyspnea EXAM: PORTABLE CHEST 1 VIEW COMPARISON:  Chest radiograph 08/03/2022 FINDINGS: Median sternotomy wires and cardiac valve prostheses are unchanged. The cardiac silhouette is unchanged There is vascular congestion with possible mild pulmonary interstitial edema. There are patchy opacities in the right base which could reflect superimposed developing infection. There is no significant pleural effusion. There is no pneumothorax The bones are stable.  The upper mediastinal contours are normal. IMPRESSION: 1. Vascular congestion with possible mild  pulmonary interstitial edema. 2. Patchy opacities in the right base could reflect developing infection in the correct clinical setting. Upright PA and lateral radiographs with good inspiratory effort could be considered for better evaluation. Electronically Signed   By: Valetta Mole M.D.   On: 08/05/2022 12:24   EEG adult  Result Date: 08/04/2022 Lora Havens, MD     08/04/2022 10:34 AM Patient Name: Sania Noy MRN: [id number] Epilepsy Attending: Lora Havens Referring Physician/Provider: Charlynne Cousins Date: 08/04/2022 Duration: 24.37 mins Patient history: 61 year old female with syncope.  EEG to evaluate for seizure. Level of alertness: Awake AEDs during EEG study: None Technical aspects: This EEG study was done with scalp electrodes positioned according to the 10-20 International system of electrode placement. Electrical activity was reviewed with band pass filter of 1-$RemoveBef'70Hz'GTuGFaiXrX$ , sensitivity of 7 uV/mm, display speed of 89mm/sec with a $Remo'60Hz'SozhY$  notched filter applied as appropriate. EEG data were recorded continuously and digitally stored.  Video monitoring was available and reviewed as appropriate. Description: The posterior dominant rhythm consists of 8-9 Hz activity of moderate voltage (25-35 uV) seen predominantly in posterior head regions, symmetric and reactive to eye opening and eye closing. Physiologic photic driving was not seen during photic stimulation.  Hyperventilation was not performed.   IMPRESSION: This study is within normal limits. No seizures or epileptiform discharges were seen throughout the recording. A normal interictal EEG does not exclude nor support the diagnosis of epilepsy. Priyanka Barbra Sarks   CT HEAD WO CONTRAST (5MM)  Result Date: 08/03/2022 CLINICAL DATA:  Hypotensive.  Loss of consciousness. EXAM: CT HEAD WITHOUT CONTRAST TECHNIQUE: Contiguous axial images were obtained from the base of the skull through the vertex without intravenous contrast. RADIATION DOSE  REDUCTION: This exam was performed according to the departmental dose-optimization program which includes automated exposure control, adjustment of the mA and/or kV according to patient size and/or use of iterative reconstruction technique. COMPARISON:  07/26/2022 FINDINGS: Brain: No evidence of acute infarction, hemorrhage, hydrocephalus, extra-axial collection or mass lesion/mass effect. Vascular: No hyperdense vessel or unexpected calcification. Skull: Normal. Negative for fracture or focal lesion. Sinuses/Orbits: No acute finding. Other: None. IMPRESSION: 1. No acute intracranial abnormalities. Electronically Signed   By: Kerby Moors M.D.   On: 08/03/2022 07:33   DG CHEST PORT 1 VIEW  Result Date: 08/03/2022 CLINICAL DATA:  Shortness of breath EXAM:  PORTABLE CHEST 1 VIEW COMPARISON:  Eight days ago FINDINGS: Interstitial opacity with some Kerley lines. No visible effusion. Cardiopericardial enlargement. Prior median sternotomy for AV valve repairs. IMPRESSION: CHF pattern. Electronically Signed   By: Jorje Guild M.D.   On: 08/03/2022 06:40   DG Knee 1-2 Views Left  Result Date: 08/03/2022 CLINICAL DATA:  Syncope and fall EXAM: LEFT KNEE - 2 VIEW COMPARISON:  05/28/2022 FINDINGS: No evidence of fracture, dislocation, or joint effusion. Joint space narrowing with mild spurring at the medial compartment. IMPRESSION: No acute finding. 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 Electronically Signed   By: Jorje Guild M.D.   On: 08/03/2022 04:16   DG Knee 1-2 Views Right  Result Date: 08/03/2022 CLINICAL DATA:  Syncope and fall EXAM: RIGHT KNEE - 2 VIEW COMPARISON:  None Available. FINDINGS: No evidence of fracture, dislocation, or joint effusion. Minimal marginal spurring at the medial compartment. IMPRESSION: No acute finding. Electronically Signed   By: Jorje Guild M.D.   On: 08/03/2022 04:15   ECHO TEE  Result Date: 07/28/2022    TRANSESOPHOGEAL ECHO REPORT   Patient Name:   HALANA DEISHER Date of Exam: 07/28/2022 Medical Rec #:  [id number]         Height:       71.0 in Accession #:    [id number]        Weight:       205.0 lb Date of Birth:  10/23/1961         BSA:          2.131 m Patient Age:    89 years          BP:           114/72 mmHg Patient Gender: F                 HR:           83 bpm. Exam Location:  Inpatient Procedure: Transesophageal Echo, 3D Echo, Color Doppler and Cardiac Doppler Indications:     elevated mitral valve gradients  History:         Patient has prior history of Echocardiogram examinations, most                  recent 07/27/2022. Risk Factors:Diabetes, Hypertension, GERD and                  Current Smoker.                   Mitral Valve: prosthetic annuloplasty ring valve is present in                  the mitral position. Procedure Date: 04/19/22.  Sonographer:     Bernadene Person RDCS Referring Phys:  [id number] [facility] A Gasper Sells Diagnosing Phys: Sanda Klein MD PROCEDURE: After discussion of the risks and benefits of a TEE, an informed consent was obtained from the patient. The transesophogeal probe was passed without difficulty through the esophogus of the patient. Sedation performed by different physician. The patient was monitored while under deep sedation. Anesthestetic sedation was provided intravenously by Anesthesiology: 137.71m of Propofol, 838mof Lidocaine. The patient's vital signs; including heart rate, blood pressure, and oxygen saturation; remained stable throughout the procedure. The patient developed no complications during the procedure. IMPRESSIONS  1. Left ventricular ejection fraction, by estimation, is 60 to 65%. The left ventricle has normal function. The left ventricle has no regional wall motion abnormalities. There is mild  concentric left ventricular hypertrophy. Left ventricular diastolic function could not be evaluated.  2. Right ventricular systolic function is normal. The right ventricular size is normal. There is moderately  elevated pulmonary artery systolic pressure. The estimated right ventricular systolic pressure is 43.5 mmHg.  3. Left atrial size was severely dilated. No left atrial/left atrial appendage thrombus was detected. The LAA emptying velocity was 70 cm/s.  4. Right atrial size was mildly dilated.  5. The mitral valve has been repaired/replaced. Mild mitral valve regurgitation. Mild mitral stenosis. The mean mitral valve gradient is 6.6 mmHg with average heart rate of 83 bpm. 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 Procedure Date: 04/19/22.  6. The tricuspid valve is has been repaired/replaced. 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 Tricuspid valve regurgitation is moderate.  7. The aortic valve is tricuspid. Aortic valve regurgitation is not visualized. No aortic stenosis is present. Comparison(s): A "double envelope" or "ghosting" artifact is seen during CW interrogation of the mitral inflow. This may have led to overestimation of the transmitral gradient on the previous study. FINDINGS  Left Ventricle: Left ventricular ejection fraction, by estimation, is 60 to 65%. The left ventricle has normal function. The left ventricle has no regional wall motion abnormalities. The left ventricular internal cavity size was normal in size. There is  mild concentric left ventricular hypertrophy. Left ventricular diastolic function could not be evaluated due to mitral valve repair. Left ventricular diastolic function could not be evaluated. Right Ventricle: The right ventricular size is normal. No increase in right ventricular wall thickness. Right ventricular systolic function is normal. There is moderately elevated pulmonary artery systolic pressure. The tricuspid regurgitant velocity is 3.52 m/s, and with an assumed right atrial pressure of 5 mmHg, the estimated right ventricular systolic pressure is 68.6 mmHg. Left Atrium: Left atrial size was severely dilated. No left  atrial/left atrial appendage thrombus was detected. The LAA emptying velocity was 70 cm/s. Right Atrium: Right atrial size was mildly dilated. Pericardium: There is no evidence of pericardial effusion. Mitral Valve: The mitral valve has been repaired/replaced. Mild mitral valve regurgitation, with centrally-directed jet. 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 Procedure Date: 04/19/22. Mild mitral valve stenosis. The mean  mitral valve gradient is 6.6 mmHg with average heart rate of 83 bpm. Tricuspid Valve: The tricuspid valve is has been repaired/replaced. Tricuspid valve regurgitation is moderate. 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 Aortic Valve: The aortic valve is tricuspid. Aortic valve regurgitation is not visualized. No aortic stenosis is present. Pulmonic Valve: The pulmonic valve was normal in structure. Pulmonic valve regurgitation is not visualized. Aorta: The aortic root, ascending aorta, aortic arch and descending aorta are all structurally normal, with no evidence of dilitation or obstruction. There is minimal (Grade I) plaque. IAS/Shunts: No atrial level shunt detected by color flow Doppler.  MITRAL VALVE             TRICUSPID VALVE MV Area (PHT): 2.48 cm  TV Area (PHT):  2.26 cm MV Mean grad:  6.6 mmHg  TV Mean grad:   1.3 mmHg MV Decel Time: 306 msec  TV PHT:         97 msec                          TR Peak grad:   49.6 mmHg  TR Vmax:        352.00 cm/s Sanda Klein MD Electronically signed by Sanda Klein MD Signature Date/Time: 07/28/2022/2:32:07 PM    Final    ECHOCARDIOGRAM LIMITED  Result Date: 07/27/2022    ECHOCARDIOGRAM LIMITED REPORT   Patient Name:   BETHAN ADAMEK Date of Exam: 07/27/2022 Medical Rec #:  [id number]         Height:       71.0 in Accession #:    [id number]        Weight:       210.2 lb Date of Birth:  [birth year]         BSA:          2.154 m Patient Age:    77 years           BP:           155/82 mmHg Patient Gender: F                 HR:           87 bpm. Exam Location:  Inpatient Procedure: Limited Echo, Limited Color Doppler and Cardiac Doppler Indications:    mitral valve disorder  History:        Patient has prior history of Echocardiogram examinations, most                 recent 05/31/2022. CHF, Arrythmias:Bradycardia; Risk                 Factors:Dyslipidemia, Diabetes and Current Smoker.                  Mitral Valve: valve is present in the mitral position. Procedure                 Date: 04/19/22.  Sonographer:    Johny Chess RDCS Referring Phys: [id number] [place] A CHANDRASEKHAR IMPRESSIONS  1. Left ventricular ejection fraction, by estimation, is 50 to 55%. The left ventricle has low normal function.  2. Left atrial size was severely dilated.  3. Right atrial size was moderately dilated.  4. S/p MV prosthetic annuloplasty ring placed 04/19/2022. mod-severe mitral stenosis, MV mean gradient 15 mmHg, peak 27 mmhg at HR 87 bpm. Mild mitral valve regurgitation. There is a present in the mitral position. Procedure Date: 04/19/22.  5. 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 Tricuspid valve regurgitation is moderate.  6. Aortic valve regurgitation is not visualized.  7. There is severely elevated pulmonary artery systolic pressure. Conclusion(s)/Recommendation(s): Compared to prior study, MV mean gradient has increased. May be related to rate. Consider TEE/cardiac CT if clinically indicated. FINDINGS  Left Ventricle: Left ventricular ejection fraction, by estimation, is 50 to 55%. The left ventricle has low normal function. Right Ventricle: There is severely elevated pulmonary artery systolic pressure. The tricuspid regurgitant velocity is 3.74 m/s, and with an assumed right atrial pressure of 5 mmHg, the estimated right ventricular systolic pressure is 42.8 mmHg. Left Atrium: Left atrial size was severely dilated. Right Atrium: Right atrial size was  moderately dilated. Mitral Valve: S/p MV prosthetic annuloplasty ring placed 04/19/2022. mod-severe mitral stenosis, MV mean gradient 15 mmHg, peak 27 mmhg at HR 87 bpm. Mild mitral valve regurgitation. There is a present in the mitral position. Procedure Date: 04/19/22. MV peak gradient, 26.8 mmHg. The mean mitral valve gradient is 15.0 mmHg. Tricuspid Valve: Tricuspid valve regurgitation is moderate. 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  Aortic Valve: Aortic valve regurgitation is not visualized. Pulmonic Valve: Pulmonic valve regurgitation is not visualized. LEFT VENTRICLE PLAX 2D LVOT diam:     2.30 cm LV SV:         88 LV SV Index:   41 LVOT Area:     4.15 cm  IVC IVC diam: 2.20 cm AORTIC VALVE             PULMONIC VALVE LVOT Vmax:   114.50 cm/s RVOT Peak grad: 1 mmHg LVOT Vmean:  75.250 cm/s LVOT VTI:    0.213 m MITRAL VALVE              TRICUSPID VALVE MV Area VTI:  1.53 cm    TV Peak grad:   6.5 mmHg MV Peak grad: 26.8 mmHg   TV Mean grad:   4.0 mmHg MV Mean grad: 15.0 mmHg   TV Vmax:        1.27 m/s MV Vmax:      2.59 m/s    TV Vmean:       93.6 cm/s MV Vmean:     183.0 cm/s  TV VTI:         0.29 msec MR Peak grad: 149.3 mmHg  TR Peak grad:   56.0 mmHg MR Mean grad: 97.0 mmHg   TR Vmax:        374.00 cm/s MR Vmax:      611.00 cm/s MR Vmean:     469.0 cm/s  SHUNTS                           Systemic VTI:  0.21 m                           Systemic Diam: 2.30 cm                           Pulmonic VTI:  0.107 m Phineas Inches Electronically signed by Phineas Inches Signature Date/Time: 07/27/2022/12:28:20 PM    Final    DG Chest Port 1 View  Result Date: 07/26/2022 CLINICAL DATA:  Syncope EXAM: PORTABLE CHEST 1 VIEW COMPARISON:  07/22/2022 FINDINGS: Transverse diameter of heart is increased. Central pulmonary vessels are more prominent. Increased interstitial markings are seen in the parahilar regions and lower lung fields. There is no focal consolidation. There is no pleural effusion  or pneumothorax. 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 IMPRESSION: Central pulmonary vessels are more prominent. Increased interstitial markings are seen in the parahilar regions and lower lung fields. Findings suggest CHF with interstitial pulmonary edema. Electronically Signed   By: Elmer Picker M.D.   On: 07/26/2022 12:49   CT Head Wo Contrast  Result Date: 07/26/2022 CLINICAL DATA:  Head trauma, coagulopathy, syncopal episode striking head, history MVR, d type II iabetes mellitus, hypertension, smoker EXAM: CT HEAD WITHOUT CONTRAST TECHNIQUE: Contiguous axial images were obtained from the base of the skull through the vertex without intravenous contrast. RADIATION DOSE REDUCTION: This exam was performed according to the departmental dose-optimization program which includes automated exposure control, adjustment of the mA and/or kV according to patient size and/or use of iterative reconstruction technique. COMPARISON:  05/22/2022 FINDINGS: Brain: Normal ventricular morphology. No midline shift or mass effect. Normal appearance of brain parenchyma. No intracranial hemorrhage, mass lesion, evidence of acute infarction, or extra-axial fluid collection. Few scattered nonspecific dural calcifications  unchanged. Vascular: No hyperdense vessels. Skull: Intact Sinuses/Orbits: Clear Other: N/A IMPRESSION: No acute intracranial abnormalities. Electronically Signed   By: Lavonia Dana M.D.   On: 07/26/2022 12:27   DG Chest Port 1 View  Result Date: 07/22/2022 CLINICAL DATA:  Back pain and bradycardia EXAM: PORTABLE CHEST 1 VIEW COMPARISON:  05/28/2022 FINDINGS: Cardiac shadow is within normal limits. Postsurgical changes are again seen. Lungs are well aerated bilaterally. No acute bony abnormality is seen. IMPRESSION: No active disease. Electronically Signed   By: Inez Catalina M.D.   On: 07/22/2022 19:38    Microbiology: Results for orders placed or performed during the  hospital encounter of 08/12/22  SARS Coronavirus 2 by RT PCR (hospital order, performed in [facility] hospital lab) *cepheid single result test* Anterior Nasal Swab     Status: None   Collection Time: 08/12/22  2:24 PM   Specimen: Anterior Nasal Swab  Result Value Ref Range Status   SARS Coronavirus 2 by RT PCR NEGATIVE NEGATIVE Final    Comment: (NOTE) SARS-CoV-2 target nucleic acids are NOT DETECTED.  The SARS-CoV-2 RNA is generally detectable in upper and lower respiratory specimens during the acute phase of infection. The lowest concentration of SARS-CoV-2 viral copies this assay can detect is 250 copies / mL. A negative result does not preclude SARS-CoV-2 infection and should not be used as the sole basis for treatment or other patient management decisions.  A negative result may occur with improper specimen collection / handling, submission of specimen other than nasopharyngeal swab, presence of viral mutation(s) within the areas targeted by this assay, and inadequate number of viral copies (<250 copies / mL). A negative result must be combined with clinical observations, patient history, and epidemiological information.  Fact Sheet for Patients:   https://www.patel.info/  Fact Sheet for Healthcare Providers: https://hall.com/  This test is not yet approved or  cleared by the [place] FDA and has been authorized for detection and/or diagnosis of SARS-CoV-2 by FDA under an Emergency Use Authorization (EUA).  This EUA will remain in effect (meaning this test can be used) for the duration of the COVID-19 declaration under Section 564(b)(1) of the Act, 21 U.S.C. section 360bbb-3(b)(1), unless the authorization is terminated or revoked sooner.  Performed at [facility], [place] [address], [place], [place] [address]   Blood culture (routine x 2)     Status: None   Collection Time: 08/12/22  3:08 PM   Specimen: BLOOD RIGHT ARM   Result Value Ref Range Status   Specimen Description BLOOD RIGHT ARM  Final   Special Requests   Final    BOTTLES DRAWN AEROBIC AND ANAEROBIC Blood Culture adequate volume   Culture   Final    NO GROWTH 5 DAYS Performed at [facility], [place] [address] [place], [place] [address]    Report Status 08/17/2022 FINAL  Final  Blood culture (routine x 2)     Status: None   Collection Time: 08/12/22  3:28 PM   Specimen: BLOOD  Result Value Ref Range Status   Specimen Description BLOOD LEFT ANTECUBITAL  Final   Special Requests   Final    BOTTLES DRAWN AEROBIC ONLY Blood Culture results may not be optimal due to an inadequate volume of blood received in culture bottles   Culture   Final    NO GROWTH 5 DAYS Performed at [facility], [place] [address], [place], [place] [address]    Report Status 08/17/2022 FINAL  Final  MRSA Next Gen by PCR, Nasal     Status: None   Collection Time: 08/12/22 11:27 PM   Specimen: Nasal Mucosa; Nasal Swab  Result Value Ref Range Status   MRSA by PCR Next Gen NOT DETECTED NOT DETECTED Final    Comment: (NOTE) The GeneXpert MRSA Assay (FDA approved for NASAL specimens only), is one component of a comprehensive MRSA colonization surveillance program. It is not intended to diagnose MRSA infection nor to guide or monitor treatment for MRSA infections. Test performance is not FDA approved in patients less than 90 years old. Performed at [facility], [place] [address] [place], [place] [address]     Labs: CBC: Recent Labs  Lab 08/12/22 1352 08/13/22 0620 08/14/22 0325 08/15/22 0616 08/16/22 0423 08/17/22 0503  WBC 10.4 7.0 5.2 5.1 5.6 5.7  NEUTROABS 7.8*  --   --   --   --   --   HGB 8.6* 8.6* 8.5* 9.0* 9.1* 9.7*  HCT 27.8* 26.2* 25.0* 26.2* 26.5* 28.4*  MCV 92.1 87.3 86.2 85.9 84.7 84.5  PLT 194 175 172 176 212 071   Basic Metabolic Panel: Recent Labs  Lab 08/12/22 1352 08/12/22 1829 08/13/22 0620 08/15/22 0616 08/16/22 0423   NA 132*  --  133* 139 137  K 4.2  --  3.6 4.6 4.5  CL 102  --  103 110 108  CO2 22  --  22 22 20*  GLUCOSE 108*  --  87 71 77  BUN 21  --  20 7* 6*  CREATININE 4.07*  --  2.37* 0.84 1.04*  CALCIUM 7.4*  --  7.6* 8.3* 8.9  MG  --  1.5* 1.6*  --   --   PHOS  --  6.0* 4.4  --   --    Liver Function Tests: Recent Labs  Lab 08/12/22 1352  AST 19  ALT 11  ALKPHOS 57  BILITOT 0.4  PROT 6.1*  ALBUMIN 3.6   CBG: Recent Labs  Lab 08/12/22 1725 08/13/22 0012 08/13/22 2104 08/14/22 1126 08/14/22 1619  GLUCAP 159* 110* 113* 86 79    Discharge time spent: greater than 30 minutes.  Signed: Tawni Millers, MD [facility] 08/17/2022

## 2022-08-18 ENCOUNTER — Other Ambulatory Visit: Payer: Self-pay

## 2022-08-18 ENCOUNTER — Telehealth: Payer: Self-pay | Admitting: Internal Medicine

## 2022-08-18 NOTE — Telephone Encounter (Signed)
Pt c/o medication issue:  1. Name of Medication: Lasix  2. How are you currently taking this medication (dosage and times per day)? NA  3. Are you having a reaction (difficulty breathing--STAT)? No  4. What is your medication issue? Pt states that while in the hospital they took her off of above medication. Pt would like to know if she should still take or not. Please advise

## 2022-08-18 NOTE — Telephone Encounter (Signed)
Called pt advised that per hospital discharge summary pt is not to take furosemide.  Pt expresses wants to know if MD wants her to stop med.  Expresses has a history of fluid on her heart.   BP this A.M 154/92 Reviewed inpatient BP readings 2 days prior to DC SBP 140-150's. Advised pt will discuss with MD and call back at a later time.  Spoke with MD, request OV with pt and pt is to hold furosemide until seen in office.  Called pt advised of MD recommendation scheduled for 08/24/22 at 2:20 pm.  Pt had no further questions or concerns.

## 2022-08-24 ENCOUNTER — Encounter: Payer: Self-pay | Admitting: Internal Medicine

## 2022-08-24 ENCOUNTER — Telehealth: Payer: Self-pay | Admitting: *Deleted

## 2022-08-24 ENCOUNTER — Ambulatory Visit: Payer: Medicare Other | Attending: Internal Medicine | Admitting: Internal Medicine

## 2022-08-24 VITALS — BP 110/68 | HR 63 | Ht 71.0 in | Wt 195.6 lb

## 2022-08-24 DIAGNOSIS — I342 Nonrheumatic mitral (valve) stenosis: Secondary | ICD-10-CM | POA: Diagnosis not present

## 2022-08-24 DIAGNOSIS — R55 Syncope and collapse: Secondary | ICD-10-CM | POA: Diagnosis not present

## 2022-08-24 DIAGNOSIS — I4891 Unspecified atrial fibrillation: Secondary | ICD-10-CM

## 2022-08-24 DIAGNOSIS — I480 Paroxysmal atrial fibrillation: Secondary | ICD-10-CM

## 2022-08-24 MED ORDER — NICOTINE 21 MG/24HR TD PT24: 21.0000 mg | MEDICATED_PATCH | Freq: Every day | TRANSDERMAL | 0 refills | Status: DC

## 2022-08-24 NOTE — Patient Instructions (Signed)
Medication Instructions:  Your physician has recommended you make the following change in your medication:  INCREASE: Nicoderm to 21 mg/24 hours place 1 patch to skin daily  *If you need a refill on your cardiac medications before your next appointment, please call your pharmacy*   Lab Work: TODAY: CBC, BMP, BNP If you have labs (blood work) drawn today and your tests are completely normal, you will receive your results only by: [place] (if you have MyChart) OR A paper copy in the mail If you have any lab test that is abnormal or we need to change your treatment, we will call you to review the results.   Testing/Procedures: Your physician has requested that you wear a 14 day heart monitor.    Follow-Up: At [facility], you and your health needs are our priority.  As part of our continuing mission to provide you with exceptional heart care, we have created designated Provider Care Teams.  These Care Teams include your primary Cardiologist (physician) and Advanced Practice Providers (APPs -  Physician Assistants and Nurse Practitioners) who all work together to provide you with the care you need, when you need it.  We recommend signing up for the patient portal called "MyChart".  Sign up information is provided on this After Visit Summary.  MyChart is used to connect with patients for Virtual Visits (Telemedicine).  Patients are able to view lab/test results, encounter notes, upcoming appointments, etc.  Non-urgent messages can be sent to your provider as well.   To learn more about what you can do with MyChart, go to [email or web address].    Your next appointment:   3 month(s)  The format for your next appointment:   In Person  Provider:   Werner Lean, MD     Other Instructions ZIO AT Long term monitor-Live Telemetry  Your physician has requested you wear a ZIO patch monitor for 14 days.  This is a single patch monitor. [employer] supplies one patch  monitor per enrollment. Additional  stickers are not available.  Please do not apply patch if you will be having a Nuclear Stress Test, Cardiac CT, MRI,  or Chest Xray during the period you would be wearing the monitor. The patch cannot be worn during  these tests. You cannot remove and re-apply the ZIO AT patch monitor.  Your ZIO patch monitor will be mailed 3 day USPS to your address on file. It may take 3-5 days to  receive your monitor after you have been enrolled.  Once you have received your monitor, please review the enclosed instructions. Your monitor has  already been registered assigning a specific monitor serial # to you.   Billing and Patient Assistance Program information  [name] has been supplied with any insurance information on record for billing. [employer] offers a sliding scale Patient Assistance Program for patients without insurance, or whose  insurance does not completely cover the cost of the ZIO patch monitor. You must apply for the  Patient Assistance Program to qualify for the discounted rate. To apply, call [employer] at [phone],  select option 4, select option 2 , ask to apply for the Patient Assistance Program, (you can request an  interpreter if needed). [employer] will ask your household income and how many people are in your  household. [employer] will quote your out-of-pocket cost based on this information. They will also be able  to set up a 12 month interest free payment plan if needed.  Applying the monitor  Shave hair from upper left chest.  Hold the abrader disc by orange tab. Rub the abrader in 40 strokes over left upper chest as indicated in  your monitor instructions.  Clean area with 4 enclosed alcohol pads. Use all pads to ensure the area is cleaned thoroughly. Let  dry.  Apply patch as indicated in monitor instructions. Patch will be placed under collarbone on left side of  chest with arrow pointing upward.  Rub patch adhesive wings for 2 minutes.  Remove the white label marked "1". Remove the white label  marked "2". Rub patch adhesive wings for 2 additional minutes.  While looking in a mirror, press and release button in center of patch. A small green light will flash 3-4  times. This will be your only indicator that the monitor has been turned on.  Do not shower for the first 24 hours. You may shower after the first 24 hours.  Press the button if you feel a symptom. You will hear a small click. Record Date, Time and Symptom in  the Patient Log.   Starting the Gateway  In your kit there is a Hydrographic surveyor box the size of a cellphone. This is Airline pilot. It transmits all your  recorded data to [facility]. This box must always stay within 10 feet of you. Open the box and push the *  button. There will be a light that blinks orange and then green a few times. When the light stops  blinking, the Gateway is connected to the ZIO patch. Call [employer] at [phone] to confirm your monitor is transmitting.  Returning your monitor  Remove your patch and place it inside the [employer]. In the lower half of the Gateway there is a white  bag with prepaid postage on it. Place Gateway in bag and seal. Mail package back to [facility] as soon as  possible. Your physician should have your final report approximately 7 days after you have mailed back  your monitor. Call [place] at [phone] if you have questions regarding your ZIO AT  patch monitor. Call them immediately if you see an orange light blinking on your monitor.  If your monitor falls off in less than 4 days, contact our Monitor department at [phone]. If your  monitor becomes loose or falls off after 4 days call [employer] at [phone] for suggestions on  securing your monitor   Important Information About Sugar

## 2022-08-24 NOTE — Telephone Encounter (Signed)
   Pre-operative Risk Assessment    Patient Name: [name]  DOB: 10-Feb-1961 MRN: [id number]      Request for Surgical Clearance    Procedure:   CERVICAL ESI  Date of Surgery:  Clearance 09/09/22                                 Surgeon:  NOT LISTED Surgeon's Group or Practice Name:  [facility] Phone number:  [phone] Fax number:  [phone]   Type of Clearance Requested:   - Medical  - Pharmacy:  Hold Warfarin (Coumadin) x 5 DAYS PRIOR TO PROCEDURE   Type of Anesthesia:  Not Indicated; (LOCAL?)   Additional requests/questions:    [name]   08/24/2022, 12:45 PM

## 2022-08-24 NOTE — Progress Notes (Signed)
Cardiology Office Note:    Date:  08/24/2022   ID:  [name], DOB 22-Aug-1961, MRN [id number]  PCP:  [facility] Cardiologist:  Werner Lean, MD     Referring MD: [facility] follow up  History of Present Illness:    Nyema Hachey is a 61 y.o. female with a hx of rheumatic heart disease (MV and TV) who underwent MVR and TVR in 2023.  She has had 10 ED visits or hospitalizations since surgery in May. 2023[name] a pericardial effusion and tamponade in 05/2022.  She has had GI bleed and hypotension, she was last returned to warfarin, she has received blood transfusions and GI evaluation.  She had had hypotension and at one point was on midodrine.  She has had repeat TEE without evidence of severe MS.  She had profound bradycardia with confusion related to her beta blockers.  She had had severe electrolyte derrangement. She was diagnosed with COPD. Her last discharge was 08/17/22.    Patient notes that she is doing ok.   Since last visit notes no further GI bleeds.  No chest pain or pressure .  No SOB/DOE and no PND/Orthopnea.  No weight gain or leg swelling.  No palpitations or syncope . No near syncope.  [place] and tripped over something in the grocery, did not hit her head.   Cut smoking down.  Started the patches.    Ambulatory blood pressure has been great: 130/80  Past Medical History:  Diagnosis Date   Acute pericardial effusion 05/17/2022   AKI (acute kidney injury) ([place]) 05/17/2022   Anxiety    Arthritis    Cervicalgia    CHF (congestive heart failure) (HCC)    Chondromalacia of patella    Chronic neck pain    Chronic pain syndrome    Depression    secondary to loss of her son at age 38   Diabetic neuropathy ([place])    DMII (diabetes mellitus, type 2) ([place])    Dysrhythmia    Enthesopathy of hip region    Fibromyalgia    GERD (gastroesophageal reflux disease)    Hep C w/o  coma, chronic ([place])    Hepatitis C    diagnosed 2005   Hypertension    Insomnia    Low back pain    Lumbago    Mitral regurgitation    Obesity    Pain in joint, upper arm    Primary localized osteoarthrosis, lower leg    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   Substance abuse ([place])    sober since 2001   Tobacco abuse    Tricuspid regurgitation    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    Past Surgical History:  Procedure Laterality Date   ABDOMINAL HYSTERECTOMY     at age 84, unknown reasons   CARPAL TUNNEL RELEASE  12/07/2011   left side   COLONOSCOPY WITH PROPOFOL N/A 07/30/2022   Procedure: COLONOSCOPY WITH PROPOFOL;  Surgeon: Ladene Artist, MD;  Location: [facility];  Service: Gastroenterology;  Laterality: N/A;   EYE SURGERY     FOOT SURGERY Bilateral    HOT HEMOSTASIS N/A 07/30/2022   Procedure: HOT HEMOSTASIS (ARGON PLASMA COAGULATION/BICAP);  Surgeon: Ladene Artist, MD;  Location: [place];  Service: Gastroenterology;  Laterality: N/A;   MITRAL VALVE REPAIR N/A 04/19/2022   Procedure: MITRAL VALVE REPAIR WITH 30MM PHYSIO II ANNULOPLASTY RING;  Surgeon: Melrose Nakayama, MD;  Location: [facility];  Service: Open Heart Surgery;  Laterality: N/A;   PERICARDIOCENTESIS N/A 05/10/2022   Procedure: PERICARDIOCENTESIS;  Surgeon: Jettie Booze, MD;  Location: [facility] [facility];  Service: Cardiovascular;  Laterality: N/A;   POLYPECTOMY  07/30/2022   Procedure: POLYPECTOMY;  Surgeon: Ladene Artist, MD;  Location: [facility] ENDOSCOPY;  Service: Gastroenterology;;   RIGHT/LEFT HEART CATH AND CORONARY ANGIOGRAPHY N/A 02/12/2022   Procedure: RIGHT/LEFT HEART CATH AND CORONARY ANGIOGRAPHY;  Surgeon: Belva Crome, MD;  Location: [place] [facility];  Service: Cardiovascular;  Laterality: N/A;   TEE WITHOUT CARDIOVERSION N/A 02/03/2022   Procedure: TRANSESOPHAGEAL ECHOCARDIOGRAM (TEE);  Surgeon: Josue Hector, MD;  Location: [facility] ENDOSCOPY;  Service:  Cardiovascular;  Laterality: N/A;   TEE WITHOUT CARDIOVERSION N/A 04/19/2022   Procedure: TRANSESOPHAGEAL ECHOCARDIOGRAM (TEE);  Surgeon: Melrose Nakayama, MD;  Location: [place];  Service: Open Heart Surgery;  Laterality: N/A;   TEE WITHOUT CARDIOVERSION N/A 07/28/2022   Procedure: TRANSESOPHAGEAL ECHOCARDIOGRAM (TEE);  Surgeon: Sanda Klein, MD;  Location: [place];  Service: Cardiovascular;  Laterality: N/A;   TRICUSPID VALVE REPLACEMENT N/A 04/19/2022   Procedure: TRICUSPID VALVE REPAIR WITH 32MM MC3 ANNULOPLASTY RING;  Surgeon: Melrose Nakayama, MD;  Location: [place];  Service: Open Heart Surgery;  Laterality: N/A;    Current Medications: Current Meds  Medication Sig   acetaminophen (TYLENOL) 500 MG tablet Take 1,000 mg by mouth every 6 (six) hours as needed for mild pain.   albuterol (PROVENTIL HFA;VENTOLIN HFA) 108 (90 Base) MCG/ACT inhaler Inhale 2 puffs into the lungs every 6 (six) hours as needed for wheezing or shortness of breath.    amLODipine (NORVASC) 10 MG tablet Take 1 tablet (10 mg total) by mouth daily.   ARIPiprazole (ABILIFY) 5 MG tablet Take 2.5 mg by mouth daily.   atorvastatin (LIPITOR) 20 MG tablet Take 20 mg by mouth daily.   buPROPion (WELLBUTRIN XL) 300 MG 24 hr tablet Take 300 mg by mouth in the morning.   dexlansoprazole (DEXILANT) 60 MG capsule Take 1 capsule by mouth daily.   diltiazem (CARDIZEM CD) 300 MG 24 hr capsule Take 300 mg by mouth daily.   escitalopram (LEXAPRO) 10 MG tablet Take 10 mg by mouth every morning.   FARXIGA 10 MG TABS tablet Take 10 mg by mouth daily.   gabapentin (NEURONTIN) 300 MG capsule Take 300 mg by mouth 3 (three) times daily.   linagliptin (TRADJENTA) 5 MG TABS tablet Take 5 mg by mouth daily.   losartan (COZAAR) 25 MG tablet Take 1 tablet (25 mg total) by mouth daily.   metoprolol tartrate (LOPRESSOR) 50 MG tablet Take 25 mg by mouth 2 (two) times daily.   naloxone (NARCAN) nasal spray 4 mg/0.1 mL Place 1 spray into  the nose as needed (accidental overdose).   neomycin-polymyxin b-dexamethasone (MAXITROL) 3.5-10000-0.1 SUSP Place 1 drop into both eyes daily at 12 noon.   nicotine (NICODERM CQ) 21 mg/24hr patch Place 1 patch (21 mg total) onto the skin daily.   omeprazole (PRILOSEC) 10 MG capsule Take 10 mg by mouth daily.   oxyCODONE (ROXICODONE) 15 MG immediate release tablet Take 0.5 tablets (7.5 mg total) by mouth every 12 (twelve) hours as needed for pain. (Patient taking differently: Take 7.5 mg by mouth every 6 (six) hours as needed for pain.)   PREMARIN 0.45 MG tablet Take 0.45 mg by mouth daily.   traZODone (DESYREL) 100 MG tablet Take 1 tablet (100  mg total) by mouth at bedtime as needed for sleep.   warfarin (COUMADIN) 5 MG tablet TAKE 1 TABLET BY MOUTH DAILY OR AS DIRECTED BY ANTICOAGULATION CLINIC. (Patient taking differently: Take 5 mg by mouth daily. Take 1 tablet by mouth daily and 1.5 on sundays)   [DISCONTINUED] nicotine (NICODERM CQ - DOSED IN MG/24 HOURS) 14 mg/24hr patch Place 1 patch (14 mg total) onto the skin daily.     Allergies:   Norco [hydrocodone-acetaminophen]   Social History   Socioeconomic History   Marital status: Single    Spouse name: Not on file   Number of children: 2   Years of education: Not on file   Highest education level: Not on file  Occupational History   Occupation: CNA    Comment: worked for 15 years   Occupation: disability  Tobacco Use   Smoking status: Every Day    Packs/day: 0.25    Years: 35.00    Total pack years: 8.75    Types: Cigarettes   Smokeless tobacco: Never   Tobacco comments:    4 cigarettes a day  Vaping Use   Vaping Use: Some days  Substance and Sexual Activity   Alcohol use: No   Drug use: No   Sexual activity: Not Currently    Comment: Celebate since 2000  Other Topics Concern   Not on file  Social History Narrative   Born and raised by mom until mom died when she was 14yo. After that she stayed with her aunt. Dad was  married to someone else and was never around.  Pt has 1 brother and 1 sister and pt is the middle child. Pt had a very rough childhood. Never married. 2 kids one son died several  yrs ago and other son has Schizophrenia. Pt has graduated HS and has some college. Pt is on disability and is unemployed. 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       Financial assistance approved for 100% discount at [facility] and has [facility] card   [employer]  September 29, 2010 2:27 PM   Social Determinants of Health   Financial Resource Strain: Not on file  Food Insecurity: Not on file  Transportation Needs: Not on file  Physical Activity: Not on file  Stress: Not on file  Social Connections: Not on file     Family History: The patient's family history includes Alcohol abuse in her brother, father, and sister; Arthritis in an other family member; Diabetes in an other family member; Drug abuse in her brother and sister; Heart attack (age of onset: 97) in her son; Hypertension in an other family member; Schizophrenia in her son; Uterine cancer in her mother.  ROS:   Please see the history of present illness.     All other systems reviewed and are negative.  EKGs/Labs/Other Studies Reviewed:    The following studies were reviewed today:  EKG:  EKG is  ordered today.  The ekg ordered today demonstrates  08/24/22:SR diffuse TWI  PERICARDIOCENTESIS 05/10/2022  Narrative   Successful pericardiocentesis with removal of 450 cc of serosanguineous fluid.  Fluid was not overtly bloody.  I suspect the fluid was more related to postsurgical pericarditis.  Cannot start NSAIDs at this point given her acute renal failure.  Hopefully, her renal function will improve now that her hemodynamics are better.  As her creatinine improves, consider increasing treatment for pericarditis.  We will try to dose colchicine for her renal function.  We  will leave the drain in overnight.  Effusion was essentially gone by echocardiogram  postprocedure, after the fluid removal noted above.  Plan for limited echo tomorrow.  Remove drain as determined by fluid accumulation.  Discussed with Dr. Roxan Hockey.  Results conveyed to bother, Jeneen Rinks, who is listed as the emergency contact.  He lives out of town but was going to inform the sister who lives here locally.     ECHO COMPLETE WO IMAGING ENHANCING AGENT 08/13/2022  Narrative ECHOCARDIOGRAM REPORT  Patient Name:   EMBERLYNN RIGGAN Date of Exam: 08/13/2022 Medical Rec #:  [id number]         Height:       71.0 in Accession #:    [id number]        Weight:       203.9 lb Date of Birth:  08-Aug-1961         BSA:          2.126 m Patient Age:    62 years          BP:           93/57 mmHg Patient Gender: F                 HR:           71 bpm. Exam Location:  Inpatient  Procedure: 2D Echo, Cardiac Doppler and Color Doppler  Indications:    Sinus bradycardia  History:        Patient has prior history of Echocardiogram examinations, most recent 07/28/2022. CHF, Mitral Valve Disease, Arrythmias:Atrial Fibrillation; Risk Factors:Hypertension and Diabetes.  Mitral Valve: prosthetic annuloplasty ring valve is present in the mitral position. Procedure Date: 04/19/22.  Sonographer:    Eartha Inch Referring Phys: [id number] [place]   1. S/P MV repair with mild MS (1.74 cm2; mean gradient 7 mmHg; HR 70); mild MR. 2. Left ventricular ejection fraction, by estimation, is 60 to 65%. The left ventricle has normal function. The left ventricle has no regional wall motion abnormalities. There is moderate concentric left ventricular hypertrophy. Left ventricular diastolic parameters are indeterminate. Elevated left atrial pressure. There is the interventricular septum is flattened in systole and diastole, consistent with right ventricular pressure and volume overload. 3. Right ventricular systolic function is normal. The right ventricular size is normal. There is  mildly elevated pulmonary artery systolic pressure. 4. Left atrial size was moderately dilated. 5. Right atrial size was mildly dilated. 6. The mitral valve has been repaired/replaced. Mild mitral valve regurgitation. Mild mitral stenosis. 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 Procedure Date: 04/19/22. 7. Tricuspid valve regurgitation is mild to moderate. 8. The aortic valve is tricuspid. Aortic valve regurgitation is not visualized. No aortic stenosis is present. 9. The inferior vena cava is normal in size with greater than 50% respiratory variability, suggesting right atrial pressure of 3 mmHg.  FINDINGS Left Ventricle: Left ventricular ejection fraction, by estimation, is 60 to 65%. The left ventricle has normal function. The left ventricle has no regional wall motion abnormalities. The left ventricular internal cavity size was normal in size. There is moderate concentric left ventricular hypertrophy. The interventricular septum is flattened in systole and diastole, consistent with right ventricular pressure and volume overload. Left ventricular diastolic parameters are indeterminate. Elevated left atrial pressure.  Right Ventricle: The right ventricular size is normal. Right ventricular systolic function is normal. There is mildly elevated pulmonary artery systolic pressure. The tricuspid regurgitant velocity is 3.18  m/s, and with an assumed right atrial pressure of 3 mmHg, the estimated right ventricular systolic pressure is 61.4 mmHg.  Left Atrium: Left atrial size was moderately dilated.  Right Atrium: Right atrial size was mildly dilated.  Pericardium: Trivial pericardial effusion is present.  Mitral Valve: The mitral valve has been repaired/replaced. Mild mitral valve regurgitation. 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 Procedure Date: 04/19/22. Mild mitral valve stenosis. MV peak gradient, 14.6 mmHg. The mean mitral  valve gradient is 7.0 mmHg.  Tricuspid Valve: The tricuspid valve is normal in structure. Tricuspid valve regurgitation is mild to moderate. No evidence of tricuspid stenosis.  Aortic Valve: The aortic valve is tricuspid. Aortic valve regurgitation is not visualized. No aortic stenosis is present.  Pulmonic Valve: The pulmonic valve was normal in structure. Pulmonic valve regurgitation is not visualized. No evidence of pulmonic stenosis.  Aorta: The aortic root is normal in size and structure.  Venous: The inferior vena cava is normal in size with greater than 50% respiratory variability, suggesting right atrial pressure of 3 mmHg.  IAS/Shunts: No atrial level shunt detected by color flow Doppler.  Additional Comments: S/P MV repair with mild MS (1.74 cm2; mean gradient 7 mmHg; HR 70); mild MR.   LEFT VENTRICLE PLAX 2D LVIDd:         4.50 cm   Diastology LVIDs:         3.20 cm   LV e' medial:    4.19 cm/s LV PW:         1.30 cm   LV E/e' medial:  48.0 LV IVS:        1.30 cm   LV e' lateral:   6.32 cm/s LVOT diam:     2.00 cm   LV E/e' lateral: 31.8 LV SV:         74 LV SV Index:   35 LVOT Area:     3.14 cm   RIGHT VENTRICLE             IVC RV S prime:     10.00 cm/s  IVC diam: 1.60 cm TAPSE (M-mode): 1.0 cm  LEFT ATRIUM             Index        RIGHT ATRIUM           Index LA diam:        4.50 cm 2.12 cm/m   RA Area:     13.80 cm LA Vol (A2C):   61.0 ml 28.69 ml/m  RA Volume:   29.50 ml  13.88 ml/m LA Vol (A4C):   45.2 ml 21.26 ml/m LA Biplane Vol: 54.7 ml 25.73 ml/m AORTIC VALVE LVOT Vmax:   122.00 cm/s LVOT Vmean:  86.400 cm/s LVOT VTI:    0.234 m  AORTA Ao Root diam: 3.50 cm  MITRAL VALVE                TRICUSPID VALVE MV Area (PHT): 1.74 cm     TR Peak grad:   40.4 mmHg MV Area VTI:   1.20 cm     TR Mean grad:   32.0 mmHg MV Peak grad:  14.6 mmHg    TR Vmax:        318.00 cm/s MV Mean grad:  7.0 mmHg     TR Vmean:       274.0 cm/s MV Vmax:       1.91  m/s MV Vmean:  123.0 cm/s   SHUNTS MV Decel Time: 435 msec     Systemic VTI:  0.23 m MR Peak grad: 75.0 mmHg     Systemic Diam: 2.00 cm MR Vmax:      433.00 cm/s MV E velocity: 201.00 cm/s MV A velocity: 121.00 cm/s MV E/A ratio:  1.66  Kirk Ruths MD Electronically signed by Kirk Ruths MD Signature Date/Time: 08/13/2022/12:32:14 PM    [place]   LONG TERM MONITOR (8-14 DAYS) INTERPRETATION 01/21/2022  Narrative  Patient had a minimum heart rate of 45 bpm, maximum heart rate of 171 bpm (SVT), and average heart rate of 71 bpm.  Predominant underlying rhythm was sinus rhythm.  Three runs of NSVT occurred lasting 6 beats at longest with a max rate of 169 bpm at fastest.  Many runs of SVT occurred lasting 21 seconds at longest with a max rate of 171 bpm at fastest.  Isolated PACs were rare (<1.0%).  Isolated PVCs were occasional ( 2.7%).  Triggered and diary events associated with sinus rhythm and PVCs.  Asymptomatic SVT.      Recent Labs: 08/03/2022: TSH 1.840 08/12/2022: ALT 11; B Natriuretic Peptide 497.2 08/13/2022: Magnesium 1.6 08/16/2022: BUN 6; Creatinine, Ser 1.04; Potassium 4.5; Sodium 137 08/17/2022: Hemoglobin 9.7; Platelets 201  Recent Lipid Panel    Component Value Date/Time   CHOL 180 03/25/2016 0909   TRIG 122 03/25/2016 0909   HDL 39 (L) 03/25/2016 0909   CHOLHDL 4.6 03/25/2016 0909   VLDL 24 03/25/2016 0909   LDLCALC 117 03/25/2016 0909     Risk Assessment/Calculations:    CHA2DS2-VASc Score = 4   This indicates a 4.8% annual risk of stroke. The patient's score is based upon: CHF History: 1 HTN History: 1 Diabetes History: 1 Stroke History: 0 Vascular Disease History: 0 Age Score: 0 Gender Score: 1             Physical Exam:    VS:  BP 110/68   Pulse 63   Ht $R'5\' 11"'Ts$  (1.803 m)   Wt 195 lb 9.6 oz (88.7 kg)   LMP 12/06/1976   SpO2 97%   BMI 27.28 kg/m     Wt Readings from Last 3 Encounters:  08/24/22 195 lb 9.6 oz (88.7  kg)  08/17/22 190 lb 8 oz (86.4 kg)  08/12/22 210 lb (95.3 kg)    GEN:  Well nourished, well developed in no acute distress HEENT: Normal NECK: No JVD; No carotid bruits LYMPHATICS: No lymphadenopathy CARDIAC: RRR, no rubs, gallops systolic and diastolic murmur RESPIRATORY:  Clear to auscultation without rales, wheezing or rhonchi  ABDOMEN: Soft, non-tender, non-distended MUSCULOSKELETAL:  No edema; No deformity  SKIN: Warm and dry NEUROLOGIC:  Alert and oriented x 3 PSYCHIATRIC:  Normal affect   ASSESSMENT:    1. Syncope, unspecified syncope type   2. Atrial fibrillation, unspecified type ([place])   3. Nonrheumatic mitral valve stenosis    PLAN:    Syncope Complicated by pain medication use, active tobacco abuse, and COPD - ZioPatch Live - needs to stop smoking; will change to high dose nicotine patch   Mitral stenosis Rheumatic heart disease - continue current BP regimen, BMP and BNP today - based on results will either decrease CCB to start diuretic or will give lasix 20 mg PO as a PRN - low thershold  for exercise RHC  GI bleed PAF - Warfarin; will be held as per [facility] for neck procedure; INR on 09/08/22 - recheck CBC today  December  f/u me or APP         Medication Adjustments/Labs and Tests Ordered: Current medicines are reviewed at length with the patient today.  Concerns regarding medicines are outlined above.  Orders Placed This Encounter  Procedures   CBC   Basic metabolic panel   Pro b natriuretic peptide (BNP)   LONG TERM MONITOR-LIVE TELEMETRY (3-14 DAYS)   EKG 12-Lead   Meds ordered this encounter  Medications   nicotine (NICODERM CQ) 21 mg/24hr patch    Sig: Place 1 patch (21 mg total) onto the skin daily.    Dispense:  28 patch    Refill:  0    Patient Instructions  Medication Instructions:  Your physician has recommended you make the following change in your medication:  INCREASE: Nicoderm to 21 mg/24 hours place 1 patch to skin  daily  *If you need a refill on your cardiac medications before your next appointment, please call your pharmacy*   Lab Work: TODAY: CBC, BMP, BNP If you have labs (blood work) drawn today and your tests are completely normal, you will receive your results only by: [place] (if you have MyChart) OR A paper copy in the mail If you have any lab test that is abnormal or we need to change your treatment, we will call you to review the results.   Testing/Procedures: Your physician has requested that you wear a 14 day heart monitor.    Follow-Up: At [facility], you and your health needs are our priority.  As part of our continuing mission to provide you with exceptional heart care, we have created designated Provider Care Teams.  These Care Teams include your primary Cardiologist (physician) and Advanced Practice Providers (APPs -  Physician Assistants and Nurse Practitioners) who all work together to provide you with the care you need, when you need it.  We recommend signing up for the patient portal called "MyChart".  Sign up information is provided on this After Visit Summary.  MyChart is used to connect with patients for Virtual Visits (Telemedicine).  Patients are able to view lab/test results, encounter notes, upcoming appointments, etc.  Non-urgent messages can be sent to your provider as well.   To learn more about what you can do with MyChart, go to [email or web address].    Your next appointment:   3 month(s)  The format for your next appointment:   In Person  Provider:   Werner Lean, MD     Other Instructions ZIO AT Long term monitor-Live Telemetry  Your physician has requested you wear a ZIO patch monitor for 14 days.  This is a single patch monitor. [employer] supplies one patch monitor per enrollment. Additional  stickers are not available.  Please do not apply patch if you will be having a Nuclear Stress Test, Cardiac CT, MRI,  or Chest Xray  during the period you would be wearing the monitor. The patch cannot be worn during  these tests. You cannot remove and re-apply the ZIO AT patch monitor.  Your ZIO patch monitor will be mailed 3 day USPS to your address on file. It may take 3-5 days to  receive your monitor after you have been enrolled.  Once you have received your monitor, please review the enclosed instructions. Your monitor has  already been registered assigning a specific monitor serial # to you.   Billing and Patient Assistance Program information  Theodore Demark has been supplied with any insurance information on record for billing. [employer] offers a  sliding scale Patient Assistance Program for patients without insurance, or whose  insurance does not completely cover the cost of the ZIO patch monitor. You must apply for the  Patient Assistance Program to qualify for the discounted rate. To apply, call [employer] at [phone],  select option 4, select option 2 , ask to apply for the Patient Assistance Program, (you can request an  interpreter if needed). [employer] will ask your household income and how many people are in your  household. [employer] will quote your out-of-pocket cost based on this information. They will also be able  to set up a 12 month interest free payment plan if needed.  Applying the monitor   Shave hair from upper left chest.  Hold the abrader disc by orange tab. Rub the abrader in 40 strokes over left upper chest as indicated in  your monitor instructions.  Clean area with 4 enclosed alcohol pads. Use all pads to ensure the area is cleaned thoroughly. Let  dry.  Apply patch as indicated in monitor instructions. Patch will be placed under collarbone on left side of  chest with arrow pointing upward.  Rub patch adhesive wings for 2 minutes. Remove the white label marked "1". Remove the white label  marked "2". Rub patch adhesive wings for 2 additional minutes.  While looking in a mirror, press and release  button in center of patch. A small green light will flash 3-4  times. This will be your only indicator that the monitor has been turned on.  Do not shower for the first 24 hours. You may shower after the first 24 hours.  Press the button if you feel a symptom. You will hear a small click. Record Date, Time and Symptom in  the Patient Log.   Starting the Gateway  In your kit there is a Hydrographic surveyor box the size of a cellphone. This is Airline pilot. It transmits all your  recorded data to [facility]. This box must always stay within 10 feet of you. Open the box and push the *  button. There will be a light that blinks orange and then green a few times. When the light stops  blinking, the Gateway is connected to the ZIO patch. Call [employer] at [phone] to confirm your monitor is transmitting.  Returning your monitor  Remove your patch and place it inside the [place]. In the lower half of the Gateway there is a white  bag with prepaid postage on it. Place [place] in bag and seal. Mail package back to [place] as soon as  possible. Your physician should have your final report approximately 7 days after you have mailed back  your monitor. Call [facility] at [phone] if you have questions regarding your ZIO AT  patch monitor. Call them immediately if you see an orange light blinking on your monitor.  If your monitor falls off in less than 4 days, contact our Monitor department at [phone]. If your  monitor becomes loose or falls off after 4 days call [employer] at [phone] for suggestions on  securing your monitor   Important Information About Sugar         Signed, Werner Lean, MD  08/24/2022 3:49 PM    [place]

## 2022-08-25 ENCOUNTER — Other Ambulatory Visit (INDEPENDENT_AMBULATORY_CARE_PROVIDER_SITE_OTHER): Payer: Medicare Other

## 2022-08-25 DIAGNOSIS — I4891 Unspecified atrial fibrillation: Secondary | ICD-10-CM

## 2022-08-25 DIAGNOSIS — R55 Syncope and collapse: Secondary | ICD-10-CM

## 2022-08-25 LAB — CBC
Hematocrit: 29 % — ABNORMAL LOW (ref 34.0–46.6)
Hemoglobin: 9.3 g/dL — ABNORMAL LOW (ref 11.1–15.9)
MCH: 27.7 pg (ref 26.6–33.0)
MCHC: 32.1 g/dL (ref 31.5–35.7)
MCV: 86 fL (ref 79–97)
Platelets: 239 10*3/uL (ref 150–450)
RBC: 3.36 x10E6/uL — ABNORMAL LOW (ref 3.77–5.28)
RDW: 15.4 % (ref 11.7–15.4)
WBC: 6.4 10*3/uL (ref 3.4–10.8)

## 2022-08-25 LAB — BASIC METABOLIC PANEL
BUN/Creatinine Ratio: 9 — ABNORMAL LOW (ref 12–28)
BUN: 11 mg/dL (ref 8–27)
CO2: 23 mmol/L (ref 20–29)
Calcium: 9.1 mg/dL (ref 8.7–10.3)
Chloride: 103 mmol/L (ref 96–106)
Creatinine, Ser: 1.23 mg/dL — ABNORMAL HIGH (ref 0.57–1.00)
Glucose: 74 mg/dL (ref 70–99)
Potassium: 4.9 mmol/L (ref 3.5–5.2)
Sodium: 139 mmol/L (ref 134–144)
eGFR: 50 mL/min/{1.73_m2} — ABNORMAL LOW (ref >59)

## 2022-08-25 LAB — PRO B NATRIURETIC PEPTIDE: NT-Pro BNP: 2927 pg/mL — ABNORMAL HIGH (ref 0–287)

## 2022-08-25 NOTE — Progress Notes (Unsigned)
Enrolled for [employer] to mail a ZIO AT Live Telemetry monitor to patients address on file.  

## 2022-08-26 ENCOUNTER — Telehealth: Payer: Self-pay

## 2022-08-26 ENCOUNTER — Ambulatory Visit: Payer: Medicare Other | Attending: Internal Medicine

## 2022-08-26 DIAGNOSIS — I342 Nonrheumatic mitral (valve) stenosis: Secondary | ICD-10-CM

## 2022-08-26 DIAGNOSIS — Z9889 Other specified postprocedural states: Secondary | ICD-10-CM

## 2022-08-26 DIAGNOSIS — I480 Paroxysmal atrial fibrillation: Secondary | ICD-10-CM

## 2022-08-26 DIAGNOSIS — Z5181 Encounter for therapeutic drug level monitoring: Secondary | ICD-10-CM | POA: Diagnosis not present

## 2022-08-26 LAB — POCT INR: INR: 2.6 (ref 2.0–3.0)

## 2022-08-26 MED ORDER — AMLODIPINE BESYLATE 5 MG PO TABS: 5.0000 mg | ORAL_TABLET | Freq: Every day | ORAL | 3 refills | Status: DC

## 2022-08-26 MED ORDER — FUROSEMIDE 20 MG PO TABS: 20.0000 mg | ORAL_TABLET | Freq: Every day | ORAL | 3 refills | Status: DC

## 2022-08-26 NOTE — Telephone Encounter (Addendum)
  Pavero, Harrell Gave, Dooms to hold warfarin for 5 days pre procedure. No Lovenox bridge needed.  Recheck INR 5-7 days after procedure.

## 2022-08-26 NOTE — Progress Notes (Signed)
Ok to hold warfarin for 5 days pre procedure. No Lovenox bridge needed.  Recheck INR 5-7 days after procedure.

## 2022-08-26 NOTE — Telephone Encounter (Signed)
Called pt advised of MD recommendation to decrease amlodipine to 5 mg PO QD and start furosemide 20 mg PO QD.  Will have repeat labs drawn at next Coumadin clinic OV.  Will place a note in appointment note for pt to have labs drawn.  Pt wrote down instructions had no questions or concerns.

## 2022-08-26 NOTE — Patient Instructions (Signed)
Description   Continue taking 1 tablet daily of Warfarin except 1.5 tablets on Sundays until 9/30, start holding Warfarin for 5 days prior to injections.  Recheck INR in 2 weeks - day prior to procedure.  Anticoagulation Clinic [phone]

## 2022-08-27 DIAGNOSIS — R55 Syncope and collapse: Secondary | ICD-10-CM

## 2022-08-27 DIAGNOSIS — I4891 Unspecified atrial fibrillation: Secondary | ICD-10-CM

## 2022-09-05 ENCOUNTER — Other Ambulatory Visit: Payer: Self-pay | Admitting: Internal Medicine

## 2022-09-05 DIAGNOSIS — I480 Paroxysmal atrial fibrillation: Secondary | ICD-10-CM

## 2022-09-06 NOTE — Telephone Encounter (Signed)
Prescription refill request received for warfarin Lov: 08/24/22 (Chandrasekar)  Next INR check: 09/08/22 Warfarin tablet strength: '5mg'$   Appropriate dose and refill sent to requested pharmacy.

## 2022-09-08 ENCOUNTER — Ambulatory Visit: Payer: Medicare Other | Attending: Cardiology

## 2022-09-08 DIAGNOSIS — Z9889 Other specified postprocedural states: Secondary | ICD-10-CM | POA: Diagnosis not present

## 2022-09-08 DIAGNOSIS — Z5181 Encounter for therapeutic drug level monitoring: Secondary | ICD-10-CM

## 2022-09-08 DIAGNOSIS — I480 Paroxysmal atrial fibrillation: Secondary | ICD-10-CM | POA: Diagnosis not present

## 2022-09-08 LAB — POCT INR: INR: 1.7 — AB (ref 2.0–3.0)

## 2022-09-08 NOTE — Patient Instructions (Signed)
Description   Continue to hold Warfarin until post procedure and take 1.5 tablets on Thursday and Friday and then resume taking 1 tablet daily of Warfarin except 1.5 tablets on Sundays. Recheck INR in 1 week post procedure Anticoagulation Clinic [phone]

## 2022-09-16 ENCOUNTER — Ambulatory Visit: Payer: Medicare Other

## 2022-09-17 ENCOUNTER — Telehealth: Payer: Self-pay | Admitting: Internal Medicine

## 2022-09-17 NOTE — Telephone Encounter (Signed)
Pt c/o BP issue: STAT if pt c/o blurred vision, one-sided weakness or slurred speech  1. What are your last 5 BP readings? Yesterday it was 73/58, today 80/40  2. Are you having any other symptoms (ex. Dizziness, headache, blurred vision, passed out)? Lightheaded, blurred vision, and dizziness  3. What is your BP issue? Low blood pressure

## 2022-09-17 NOTE — Telephone Encounter (Signed)
Spoke with pt who complains of low BP of 80/40 this morning.  Pt has not taken any medications today nor has she eaten.  Requested pt recheck BP and received reading of 83/58.  Pt states she has been resting, dizziness and blurred vision have improved.  Pt encouraged to eat salty snack to help increase BP and continue to monitor BP.  Will forward information to Dr Gasper Sells and his RN for further review and recommendation.  Reviewed ED precautions.

## 2022-09-17 NOTE — Telephone Encounter (Signed)
Spoke with pt and advised of Dr Oralia Rud recommendations as below.  Pt verbalizes understanding and agrees with current plan.     Hold her losartan and amlodipine today and through Monday; have her call on Monday with new BP; IF BP spikes, take her amlodipine   Werner Lean, MD

## 2022-09-23 ENCOUNTER — Ambulatory Visit: Payer: Medicare Other

## 2022-09-28 ENCOUNTER — Ambulatory Visit: Payer: Medicare Other | Attending: Cardiology

## 2022-09-28 ENCOUNTER — Other Ambulatory Visit: Payer: Self-pay

## 2022-09-28 DIAGNOSIS — Z5181 Encounter for therapeutic drug level monitoring: Secondary | ICD-10-CM | POA: Diagnosis not present

## 2022-09-28 DIAGNOSIS — I480 Paroxysmal atrial fibrillation: Secondary | ICD-10-CM | POA: Diagnosis not present

## 2022-09-28 DIAGNOSIS — I342 Nonrheumatic mitral (valve) stenosis: Secondary | ICD-10-CM

## 2022-09-28 DIAGNOSIS — Z9889 Other specified postprocedural states: Secondary | ICD-10-CM | POA: Diagnosis not present

## 2022-09-28 LAB — POCT INR: INR: 2.3 (ref 2.0–3.0)

## 2022-09-28 NOTE — Patient Instructions (Signed)
Continue taking 1 tablet daily of Warfarin except 1.5 tablets on Sundays. Recheck INR in 4 weeks Anticoagulation Clinic [phone]

## 2022-09-29 ENCOUNTER — Other Ambulatory Visit: Payer: Self-pay | Admitting: Internal Medicine

## 2022-09-29 ENCOUNTER — Encounter: Payer: Self-pay | Admitting: Podiatry

## 2022-09-29 ENCOUNTER — Ambulatory Visit (INDEPENDENT_AMBULATORY_CARE_PROVIDER_SITE_OTHER): Payer: Medicare Other | Admitting: Podiatry

## 2022-09-29 DIAGNOSIS — B351 Tinea unguium: Secondary | ICD-10-CM | POA: Diagnosis not present

## 2022-09-29 DIAGNOSIS — E0843 Diabetes mellitus due to underlying condition with diabetic autonomic (poly)neuropathy: Secondary | ICD-10-CM

## 2022-09-29 DIAGNOSIS — N179 Acute kidney failure, unspecified: Secondary | ICD-10-CM

## 2022-09-29 DIAGNOSIS — M79675 Pain in left toe(s): Secondary | ICD-10-CM | POA: Diagnosis not present

## 2022-09-29 DIAGNOSIS — N182 Chronic kidney disease, stage 2 (mild): Secondary | ICD-10-CM

## 2022-09-29 DIAGNOSIS — I480 Paroxysmal atrial fibrillation: Secondary | ICD-10-CM

## 2022-09-29 DIAGNOSIS — M79674 Pain in right toe(s): Secondary | ICD-10-CM

## 2022-09-29 LAB — BASIC METABOLIC PANEL
BUN/Creatinine Ratio: 10 — ABNORMAL LOW (ref 12–28)
BUN: 17 mg/dL (ref 8–27)
CO2: 15 mmol/L — ABNORMAL LOW (ref 20–29)
Calcium: 8.3 mg/dL — ABNORMAL LOW (ref 8.7–10.3)
Chloride: 105 mmol/L (ref 96–106)
Creatinine, Ser: 1.68 mg/dL — ABNORMAL HIGH (ref 0.57–1.00)
Glucose: 97 mg/dL (ref 70–99)
Potassium: 4.2 mmol/L (ref 3.5–5.2)
Sodium: 139 mmol/L (ref 134–144)
eGFR: 34 mL/min/{1.73_m2} — ABNORMAL LOW (ref >59)

## 2022-09-29 NOTE — Progress Notes (Signed)

## 2022-10-04 ENCOUNTER — Telehealth: Payer: Self-pay

## 2022-10-04 DIAGNOSIS — I5032 Chronic diastolic (congestive) heart failure: Secondary | ICD-10-CM

## 2022-10-04 MED ORDER — FUROSEMIDE 20 MG PO TABS: 20.0000 mg | ORAL_TABLET | ORAL | 3 refills | Status: DC

## 2022-10-04 NOTE — Telephone Encounter (Signed)
-----   Message from Werner Lean, MD sent at 10/03/2022  1:54 PM EDT ----- Creatinine increased Cute lasix to lasix every other day 2 week repeat

## 2022-10-04 NOTE — Telephone Encounter (Signed)
The patient has been notified of the result and verbalized understanding.  All questions (if any) were answered. Precious Gilding, RN 10/04/2022 3:56 PM   Pt will have repeat labs on 10/18/22.

## 2022-10-13 ENCOUNTER — Telehealth: Payer: Self-pay | Admitting: Internal Medicine

## 2022-10-13 NOTE — Telephone Encounter (Signed)
Patient reports that she has been lightheaded and dizzy upon standing all day. She states that BP was 86/56 earlier. She retook it and it was 123/60. She is unsure of her heart rate. She states that she did take all of her cardiac medications this morning. Encouraged patient to increase fluid intake, eat a small salty snack and change positions slowly. She will continue to monitor and hold evening heart medications if she remains symptomatic. Will send to Dr. Gasper Sells for further advisement and recommendations.

## 2022-10-13 NOTE — Telephone Encounter (Signed)
Called pt advised of MD recommendation:  "e has a history of very labile blood pressure and is on dual AV nodal therapy. Let's have her hold off on her metoprolol. 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 If her pressures are still low with that and hydration will have to back off her Aldactone.    Pt will hold metoprolol for now continue to monitor BP.  If BP continues to be low will call back in for further guidance.  Pt told to increase fluid intake and eat a salty snack if BP continues to be low.

## 2022-10-13 NOTE — Telephone Encounter (Signed)
Pt c/o BP issue: STAT if pt c/o blurred vision, one-sided weakness or slurred speech  1. What are your last 5 BP readings? 86/56 about 30 minutes, it is now 123/60  2. Are you having any other symptoms (ex. Dizziness, headache, blurred vision, passed out)? Lightheaded and dizzy, seeing black dots  3. What is your BP issue? Blood pressure is running low

## 2022-10-18 ENCOUNTER — Ambulatory Visit: Payer: Medicare Other | Attending: Internal Medicine

## 2022-10-18 DIAGNOSIS — I5032 Chronic diastolic (congestive) heart failure: Secondary | ICD-10-CM

## 2022-10-19 LAB — BASIC METABOLIC PANEL
BUN/Creatinine Ratio: 13 (ref 12–28)
BUN: 15 mg/dL (ref 8–27)
CO2: 17 mmol/L — ABNORMAL LOW (ref 20–29)
Calcium: 9 mg/dL (ref 8.7–10.3)
Chloride: 106 mmol/L (ref 96–106)
Creatinine, Ser: 1.12 mg/dL — ABNORMAL HIGH (ref 0.57–1.00)
Glucose: 118 mg/dL — ABNORMAL HIGH (ref 70–99)
Potassium: 4.4 mmol/L (ref 3.5–5.2)
Sodium: 139 mmol/L (ref 134–144)
eGFR: 56 mL/min/{1.73_m2} — ABNORMAL LOW (ref >59)

## 2022-10-26 ENCOUNTER — Ambulatory Visit: Payer: Medicare Other | Attending: Internal Medicine | Admitting: *Deleted

## 2022-10-26 DIAGNOSIS — I480 Paroxysmal atrial fibrillation: Secondary | ICD-10-CM | POA: Diagnosis not present

## 2022-10-26 DIAGNOSIS — Z5181 Encounter for therapeutic drug level monitoring: Secondary | ICD-10-CM

## 2022-10-26 DIAGNOSIS — Z9889 Other specified postprocedural states: Secondary | ICD-10-CM | POA: Diagnosis not present

## 2022-10-26 LAB — POCT INR: INR: 3.3 — AB (ref 2.0–3.0)

## 2022-10-26 NOTE — Patient Instructions (Addendum)
Description   Tonight take 1/2 tablet (2.'5mg'$ ) then continue taking 1 tablet daily of Warfarin except 1.5 tablets on Sundays. Recheck INR in 3 weeks. Anticoagulation Clinic [phone]

## 2022-11-03 ENCOUNTER — Other Ambulatory Visit: Payer: Self-pay | Admitting: Internal Medicine

## 2022-11-03 DIAGNOSIS — I480 Paroxysmal atrial fibrillation: Secondary | ICD-10-CM

## 2022-11-03 NOTE — Telephone Encounter (Signed)
Refill request for warfarin:  Last INR was 3.3 on 10/26/22 Next INR due 11/16/22 LOV was 08/24/22  Cathe Mons MD  Refill approved.

## 2022-11-15 ENCOUNTER — Other Ambulatory Visit: Payer: Self-pay

## 2022-11-16 ENCOUNTER — Ambulatory Visit: Payer: Medicare Other | Attending: Internal Medicine

## 2022-11-16 DIAGNOSIS — Z5181 Encounter for therapeutic drug level monitoring: Secondary | ICD-10-CM

## 2022-11-16 DIAGNOSIS — Z9889 Other specified postprocedural states: Secondary | ICD-10-CM

## 2022-11-16 DIAGNOSIS — I480 Paroxysmal atrial fibrillation: Secondary | ICD-10-CM

## 2022-11-16 LAB — POCT INR: INR: 2.6 (ref 2.0–3.0)

## 2022-11-16 NOTE — Patient Instructions (Signed)
continue taking 1 tablet daily of Warfarin except 1.5 tablets on Sundays. Recheck INR in 6 weeks. Anticoagulation Clinic [phone]

## 2022-11-18 NOTE — Progress Notes (Signed)
Cardiology Office Note:    Date:  11/19/2022   ID:  [name], DOB Jul 27, 1961, MRN [id number]  PCP:  [facility] Cardiologist:  Werner Lean, MD     Referring MD: [facility] follow up  History of Present Illness:    [name] is a 61 y.o. female with a hx of rheumatic heart disease (MV and TV) who underwent MVR and TVR in 2023.  She has had 10 ED visits or hospitalizations since surgery in May. 2023[name] a pericardial effusion and tamponade in 05/2022.  She has had GI bleed and hypotension, she was last returned to warfarin, she has received blood transfusions and GI evaluation.  She had had hypotension and at one point was on midodrine.  She has had repeat TEE without evidence of severe MS.  She had profound bradycardia with confusion related to her beta blockers.  She had had severe electrolyte derrangement. She was diagnosed with COPD.  Labile BP with Pain med we backed of some of her AV nodal therapy.  Patient notes that she is doing well.   There are no interval hospital/ED visit.   Spending time with her son for Christmas.  No chest pain or pressure .  No SOB/DOE and no PND/Orthopnea.  No weight gain or leg swelling.  No palpitations or syncope.  She is not sure of what medications she is taking.    Past Medical History:  Diagnosis Date   Acute pericardial effusion 05/17/2022   AKI (acute kidney injury) ([place]) 05/17/2022   Anxiety    Arthritis    Cervicalgia    CHF (congestive heart failure) (HCC)    Chondromalacia of patella    Chronic neck pain    Chronic pain syndrome    Depression    secondary to loss of her son at age 40   Diabetic neuropathy ([place])    DMII (diabetes mellitus, type 2) ([place])    Dysrhythmia    Enthesopathy of hip region    Fibromyalgia    GERD (gastroesophageal reflux disease)    Hep C w/o coma, chronic ([place])    Hepatitis C    diagnosed 2005    Hypertension    Insomnia    Low back pain    Lumbago    Mitral regurgitation    Obesity    Pain in joint, upper arm    Primary localized osteoarthrosis, lower leg    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   Substance abuse ([place])    sober since 2001   Tobacco abuse    Tricuspid regurgitation    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    Past Surgical History:  Procedure Laterality Date   ABDOMINAL HYSTERECTOMY     at age 12, unknown reasons   CARPAL TUNNEL RELEASE  12/07/2011   left side   COLONOSCOPY WITH PROPOFOL N/A 07/30/2022   Procedure: COLONOSCOPY WITH PROPOFOL;  Surgeon: Ladene Artist, MD;  Location: [place];  Service: Gastroenterology;  Laterality: N/A;   EYE SURGERY     FOOT SURGERY Bilateral    HOT HEMOSTASIS N/A 07/30/2022   Procedure: HOT HEMOSTASIS (ARGON PLASMA COAGULATION/BICAP);  Surgeon: Ladene Artist, MD;  Location: [place];  Service: Gastroenterology;  Laterality: N/A;   MITRAL VALVE REPAIR N/A 04/19/2022   Procedure: MITRAL VALVE REPAIR WITH 30MM PHYSIO II ANNULOPLASTY RING;  Surgeon: Melrose Nakayama, MD;  Location: [place];  Service: Open Heart Surgery;  Laterality: N/A;   PERICARDIOCENTESIS N/A 05/10/2022   Procedure: PERICARDIOCENTESIS;  Surgeon: Jettie Booze, MD;  Location: [place] [facility];  Service: Cardiovascular;  Laterality: N/A;   POLYPECTOMY  07/30/2022   Procedure: POLYPECTOMY;  Surgeon: Ladene Artist, MD;  Location: [facility] ENDOSCOPY;  Service: Gastroenterology;;   RIGHT/LEFT HEART CATH AND CORONARY ANGIOGRAPHY N/A 02/12/2022   Procedure: RIGHT/LEFT HEART CATH AND CORONARY ANGIOGRAPHY;  Surgeon: Belva Crome, MD;  Location: [place] [facility];  Service: Cardiovascular;  Laterality: N/A;   TEE WITHOUT CARDIOVERSION N/A 02/03/2022   Procedure: TRANSESOPHAGEAL ECHOCARDIOGRAM (TEE);  Surgeon: Josue Hector, MD;  Location: [facility] ENDOSCOPY;  Service: Cardiovascular;  Laterality: N/A;   TEE WITHOUT CARDIOVERSION  N/A 04/19/2022   Procedure: TRANSESOPHAGEAL ECHOCARDIOGRAM (TEE);  Surgeon: Melrose Nakayama, MD;  Location: [place];  Service: Open Heart Surgery;  Laterality: N/A;   TEE WITHOUT CARDIOVERSION N/A 07/28/2022   Procedure: TRANSESOPHAGEAL ECHOCARDIOGRAM (TEE);  Surgeon: Sanda Klein, MD;  Location: [place];  Service: Cardiovascular;  Laterality: N/A;   TRICUSPID VALVE REPLACEMENT N/A 04/19/2022   Procedure: TRICUSPID VALVE REPAIR WITH 32MM MC3 ANNULOPLASTY RING;  Surgeon: Melrose Nakayama, MD;  Location: [place];  Service: Open Heart Surgery;  Laterality: N/A;    Current Medications: Current Meds  Medication Sig   acetaminophen (TYLENOL) 500 MG tablet Take 1,000 mg by mouth every 6 (six) hours as needed for mild pain.   albuterol (PROVENTIL HFA;VENTOLIN HFA) 108 (90 Base) MCG/ACT inhaler Inhale 2 puffs into the lungs every 6 (six) hours as needed for wheezing or shortness of breath.    amLODipine (NORVASC) 5 MG tablet Take 1 tablet (5 mg total) by mouth daily.   ARIPiprazole (ABILIFY) 5 MG tablet Take 2.5 mg by mouth daily.   atorvastatin (LIPITOR) 20 MG tablet Take 20 mg by mouth daily.   buPROPion (WELLBUTRIN XL) 300 MG 24 hr tablet Take 300 mg by mouth in the morning.   dexlansoprazole (DEXILANT) 60 MG capsule Take 1 capsule by mouth daily.   diltiazem (CARDIZEM CD) 300 MG 24 hr capsule Take 300 mg by mouth daily.   FARXIGA 10 MG TABS tablet Take 10 mg by mouth daily.   FLUoxetine (PROZAC) 20 MG capsule Take 20 mg by mouth daily.   furosemide (LASIX) 20 MG tablet Take 1 tablet (20 mg total) by mouth every other day.   gabapentin (NEURONTIN) 300 MG capsule Take 300 mg by mouth 3 (three) times daily.   linagliptin (TRADJENTA) 5 MG TABS tablet Take 5 mg by mouth daily.   losartan (COZAAR) 25 MG tablet Take 1 tablet (25 mg total) by mouth daily.   naloxone (NARCAN) nasal spray 4 mg/0.1 mL Place 1 spray into the nose as needed (accidental overdose).   neomycin-polymyxin  b-dexamethasone (MAXITROL) 3.5-10000-0.1 SUSP Place 1 drop into both eyes as needed (itching).   nicotine (NICODERM CQ) 21 mg/24hr patch Place 1 patch (21 mg total) onto the skin daily.   omeprazole (PRILOSEC) 10 MG capsule Take 10 mg by mouth daily.   oxyCODONE (ROXICODONE) 15 MG immediate release tablet Take 0.5 tablets (7.5 mg total) by mouth every 12 (twelve) hours as needed for pain.   PREMARIN 0.45 MG tablet Take 0.45 mg by mouth daily.   traZODone (DESYREL) 100 MG tablet Take 1 tablet (100 mg total) by mouth at bedtime as needed for sleep.   TRINTELLIX 10 MG TABS tablet Take 10 mg by mouth daily.   warfarin (COUMADIN)  5 MG tablet TAKE 1 TABLET BY MOUTH DAILY EXCEPT 1.5 TABLETS BY MOUTH ON SUNDAYS AS DIRECTED BY COUMADIN CLINIC   [DISCONTINUED] metoprolol tartrate (LOPRESSOR) 50 MG tablet Take 25 mg by mouth 2 (two) times daily.     Allergies:   Norco [hydrocodone-acetaminophen]   Social History   Socioeconomic History   Marital status: Single    Spouse name: Not on file   Number of children: 2   Years of education: Not on file   Highest education level: Not on file  Occupational History   Occupation: CNA    Comment: worked for 15 years   Occupation: disability  Tobacco Use   Smoking status: Every Day    Packs/day: 0.25    Years: 35.00    Total pack years: 8.75    Types: Cigarettes   Smokeless tobacco: Never   Tobacco comments:    4 cigarettes a day  Vaping Use   Vaping Use: Some days  Substance and Sexual Activity   Alcohol use: No   Drug use: No   Sexual activity: Not Currently    Comment: Celebate since 2000  Other Topics Concern   Not on file  Social History Narrative   Born and raised by mom until mom died when she was 14yo. After that she stayed with her aunt. Dad was married to someone else and was never around.  Pt has 1 brother and 1 sister and pt is the middle child. Pt had a very rough childhood. Never married. 2 kids one son died several  yrs ago and  other son has Schizophrenia. Pt has graduated HS and has some college. Pt is on disability and is unemployed. 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       Financial assistance approved for 100% discount at [facility] and has [facility] card   [employer]  September 29, 2010 2:27 PM   Social Determinants of Health   Financial Resource Strain: Not on file  Food Insecurity: Not on file  Transportation Needs: Not on file  Physical Activity: Not on file  Stress: Not on file  Social Connections: Not on file     Family History: The patient's family history includes Alcohol abuse in her brother, father, and sister; Arthritis in an other family member; Diabetes in an other family member; Drug abuse in her brother and sister; Heart attack (age of onset: 39) in her son; Hypertension in an other family member; Schizophrenia in her son; Uterine cancer in her mother.  ROS:   Please see the history of present illness.     All other systems reviewed and are negative.  EKGs/Labs/Other Studies Reviewed:    The following studies were reviewed today:  EKG:   08/24/22:SR diffuse TWI  Cardiac Studies & Procedures   CARDIAC CATHETERIZATION  CARDIAC CATHETERIZATION 05/10/2022  Narrative   Successful pericardiocentesis with removal of 450 cc of serosanguineous fluid.  Fluid was not overtly bloody.  I suspect the fluid was more related to postsurgical pericarditis.  Cannot start NSAIDs at this point given her acute renal failure.  Hopefully, her renal function will improve now that her hemodynamics are better.  As her creatinine improves, consider increasing treatment for pericarditis.  We will try to dose colchicine for her renal function.  We will leave the drain in overnight.  Effusion was essentially gone by echocardiogram postprocedure, after the fluid removal noted above.  Plan for limited echo tomorrow.  Remove drain as determined by fluid accumulation.  Discussed with Dr. Roxan Hockey.  Results conveyed to  bother, Jeneen Rinks, who is listed as the emergency contact.  He lives out of town but was going to inform the sister who lives here locally.   CARDIAC CATHETERIZATION  CARDIAC CATHETERIZATION 02/12/2022  Narrative CONCLUSIONS: Mild pulmonary hypertension with mean PA pressure 31 mmHg.  Mean pulmonary wedge pressure 23 mmHg with a V wave 41 mmHg.  WHO group 2 pulmonary hypertension. Pulmonary vascular resistance 0.99 Woods units Left ventricular end diastolic pressure 35 mmHg, consistent with diastolic heart failure. Right dominant coronary anatomy.  Widely patent coronaries without obstructive disease. Previously documented severe mitral regurgitation.  RECOMMENDATIONS:  Per Dr. Glenford Bayley who is making plans for referral to CV surgeon. Home later today if no complications. Resume all home medications including p.m. doses of antihypertensive agents.  She did not take any of her medicine this morning and had elevated blood pressure requiring IV hydralazine and labetalol.  Findings Coronary Findings Diagnostic  Dominance: Right  No diagnostic findings have been documented. Intervention  No interventions have been documented.     ECHOCARDIOGRAM  ECHOCARDIOGRAM COMPLETE 08/13/2022  Narrative ECHOCARDIOGRAM REPORT    Patient Name:   KAMY POINSETT Date of Exam: 08/13/2022 Medical Rec #:  [id number]         Height:       71.0 in Accession #:    [id number]        Weight:       203.9 lb Date of Birth:  Feb 08, 1961         BSA:          2.126 m Patient Age:    10 years          BP:           93/57 mmHg Patient Gender: F                 HR:           71 bpm. Exam Location:  Inpatient  Procedure: 2D Echo, Cardiac Doppler and Color Doppler  Indications:    Sinus bradycardia  History:        Patient has prior history of Echocardiogram examinations, most recent 07/28/2022. CHF, Mitral Valve Disease, Arrythmias:Atrial Fibrillation; Risk Factors:Hypertension and Diabetes.  Mitral  Valve: prosthetic annuloplasty ring valve is present in the mitral position. Procedure Date: 04/19/22.  Sonographer:    Eartha Inch Referring Phys: [id number] [place]   1. S/P MV repair with mild MS (1.74 cm2; mean gradient 7 mmHg; HR 70); mild MR. 2. Left ventricular ejection fraction, by estimation, is 60 to 65%. The left ventricle has normal function. The left ventricle has no regional wall motion abnormalities. There is moderate concentric left ventricular hypertrophy. Left ventricular diastolic parameters are indeterminate. Elevated left atrial pressure. There is the interventricular septum is flattened in systole and diastole, consistent with right ventricular pressure and volume overload. 3. Right ventricular systolic function is normal. The right ventricular size is normal. There is mildly elevated pulmonary artery systolic pressure. 4. Left atrial size was moderately dilated. 5. Right atrial size was mildly dilated. 6. The mitral valve has been repaired/replaced. Mild mitral valve regurgitation. Mild mitral stenosis. 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 Procedure Date: 04/19/22. 7. Tricuspid valve regurgitation is mild to moderate. 8. The aortic valve is tricuspid. Aortic valve regurgitation is not visualized. No aortic stenosis is present. 9. The inferior vena cava is normal in size with greater than  50% respiratory variability, suggesting right atrial pressure of 3 mmHg.  FINDINGS Left Ventricle: Left ventricular ejection fraction, by estimation, is 60 to 65%. The left ventricle has normal function. The left ventricle has no regional wall motion abnormalities. The left ventricular internal cavity size was normal in size. There is moderate concentric left ventricular hypertrophy. The interventricular septum is flattened in systole and diastole, consistent with right ventricular pressure and volume overload. Left ventricular  diastolic parameters are indeterminate. Elevated left atrial pressure.  Right Ventricle: The right ventricular size is normal. Right ventricular systolic function is normal. There is mildly elevated pulmonary artery systolic pressure. The tricuspid regurgitant velocity is 3.18 m/s, and with an assumed right atrial pressure of 3 mmHg, the estimated right ventricular systolic pressure is 99.3 mmHg.  Left Atrium: Left atrial size was moderately dilated.  Right Atrium: Right atrial size was mildly dilated.  Pericardium: Trivial pericardial effusion is present.  Mitral Valve: The mitral valve has been repaired/replaced. Mild mitral valve regurgitation. 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 Procedure Date: 04/19/22. Mild mitral valve stenosis. MV peak gradient, 14.6 mmHg. The mean mitral valve gradient is 7.0 mmHg.  Tricuspid Valve: The tricuspid valve is normal in structure. Tricuspid valve regurgitation is mild to moderate. No evidence of tricuspid stenosis.  Aortic Valve: The aortic valve is tricuspid. Aortic valve regurgitation is not visualized. No aortic stenosis is present.  Pulmonic Valve: The pulmonic valve was normal in structure. Pulmonic valve regurgitation is not visualized. No evidence of pulmonic stenosis.  Aorta: The aortic root is normal in size and structure.  Venous: The inferior vena cava is normal in size with greater than 50% respiratory variability, suggesting right atrial pressure of 3 mmHg.  IAS/Shunts: No atrial level shunt detected by color flow Doppler.  Additional Comments: S/P MV repair with mild MS (1.74 cm2; mean gradient 7 mmHg; HR 70); mild MR.   LEFT VENTRICLE PLAX 2D LVIDd:         4.50 cm   Diastology LVIDs:         3.20 cm   LV e' medial:    4.19 cm/s LV PW:         1.30 cm   LV E/e' medial:  48.0 LV IVS:        1.30 cm   LV e' lateral:   6.32 cm/s LVOT diam:     2.00 cm   LV E/e' lateral: 31.8 LV SV:         74 LV  SV Index:   35 LVOT Area:     3.14 cm   RIGHT VENTRICLE             IVC RV S prime:     10.00 cm/s  IVC diam: 1.60 cm TAPSE (M-mode): 1.0 cm  LEFT ATRIUM             Index        RIGHT ATRIUM           Index LA diam:        4.50 cm 2.12 cm/m   RA Area:     13.80 cm LA Vol (A2C):   61.0 ml 28.69 ml/m  RA Volume:   29.50 ml  13.88 ml/m LA Vol (A4C):   45.2 ml 21.26 ml/m LA Biplane Vol: 54.7 ml 25.73 ml/m AORTIC VALVE LVOT Vmax:   122.00 cm/s LVOT Vmean:  86.400 cm/s LVOT VTI:    0.234 m  AORTA Ao  Root diam: 3.50 cm  MITRAL VALVE                TRICUSPID VALVE MV Area (PHT): 1.74 cm     TR Peak grad:   40.4 mmHg MV Area VTI:   1.20 cm     TR Mean grad:   32.0 mmHg MV Peak grad:  14.6 mmHg    TR Vmax:        318.00 cm/s MV Mean grad:  7.0 mmHg     TR Vmean:       274.0 cm/s MV Vmax:       1.91 m/s MV Vmean:      123.0 cm/s   SHUNTS MV Decel Time: 435 msec     Systemic VTI:  0.23 m MR Peak grad: 75.0 mmHg     Systemic Diam: 2.00 cm MR Vmax:      433.00 cm/s MV E velocity: 201.00 cm/s MV A velocity: 121.00 cm/s MV E/A ratio:  1.66  Kirk Ruths MD Electronically signed by Kirk Ruths MD Signature Date/Time: 08/13/2022/12:32:14 PM    Final   TEE  ECHO TEE 07/28/2022  Narrative TRANSESOPHOGEAL ECHO REPORT    Patient Name:   KIEARRA OYERVIDES Date of Exam: 07/28/2022 Medical Rec #:  [id number]         Height:       71.0 in Accession #:    [id number]        Weight:       205.0 lb Date of Birth:  1961/05/30         BSA:          2.131 m Patient Age:    9 years          BP:           114/72 mmHg Patient Gender: F                 HR:           83 bpm. Exam Location:  Inpatient  Procedure: Transesophageal Echo, 3D Echo, Color Doppler and Cardiac Doppler  Indications:     elevated mitral valve gradients  History:         Patient has prior history of Echocardiogram examinations, most recent 07/27/2022. Risk Factors:Diabetes, Hypertension, GERD and Current  Smoker.  Mitral Valve: prosthetic annuloplasty ring valve is present in the mitral position. Procedure Date: 04/19/22.  Sonographer:     Bernadene Person RDCS Referring Phys:  [id number] [facility] Diagnosing Phys: Sanda Klein MD  PROCEDURE: After discussion of the risks and benefits of a TEE, an informed consent was obtained from the patient. The transesophogeal probe was passed without difficulty through the esophogus of the patient. Sedation performed by different physician. The patient was monitored while under deep sedation. Anesthestetic sedation was provided intravenously by Anesthesiology: 137.'65mg'$  of Propofol, '80mg'$  of Lidocaine. The patient's vital signs; including heart rate, blood pressure, and oxygen saturation; remained stable throughout the procedure. The patient developed no complications during the procedure.  IMPRESSIONS   1. Left ventricular ejection fraction, by estimation, is 60 to 65%. The left ventricle has normal function. The left ventricle has no regional wall motion abnormalities. There is mild concentric left ventricular hypertrophy. Left ventricular diastolic function could not be evaluated. 2. Right ventricular systolic function is normal. The right ventricular size is normal. There is moderately elevated pulmonary artery systolic pressure. The estimated right ventricular systolic pressure is 62.2 mmHg. 3. Left atrial size was severely dilated.  No left atrial/left atrial appendage thrombus was detected. The LAA emptying velocity was 70 cm/s. 4. Right atrial size was mildly dilated. 5. The mitral valve has been repaired/replaced. Mild mitral valve regurgitation. Mild mitral stenosis. The mean mitral valve gradient is 6.6 mmHg with average heart rate of 83 bpm. 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 Procedure Date: 04/19/22. 6. The tricuspid valve is has been repaired/replaced. The tricuspid valve is status post repair with an  annuloplasty ring. Tricuspid valve regurgitation is moderate. 7. The aortic valve is tricuspid. Aortic valve regurgitation is not visualized. No aortic stenosis is present.  Comparison(s): A "double envelope" or "ghosting" artifact is seen during CW interrogation of the mitral inflow. This may have led to overestimation of the transmitral gradient on the previous study.  FINDINGS Left Ventricle: Left ventricular ejection fraction, by estimation, is 60 to 65%. The left ventricle has normal function. The left ventricle has no regional wall motion abnormalities. The left ventricular internal cavity size was normal in size. There is mild concentric left ventricular hypertrophy. Left ventricular diastolic function could not be evaluated due to mitral valve repair. Left ventricular diastolic function could not be evaluated.  Right Ventricle: The right ventricular size is normal. No increase in right ventricular wall thickness. Right ventricular systolic function is normal. There is moderately elevated pulmonary artery systolic pressure. The tricuspid regurgitant velocity is 3.52 m/s, and with an assumed right atrial pressure of 5 mmHg, the estimated right ventricular systolic pressure is 85.6 mmHg.  Left Atrium: Left atrial size was severely dilated. No left atrial/left atrial appendage thrombus was detected. The LAA emptying velocity was 70 cm/s.  Right Atrium: Right atrial size was mildly dilated.  Pericardium: There is no evidence of pericardial effusion.  Mitral Valve: The mitral valve has been repaired/replaced. Mild mitral valve regurgitation, with centrally-directed jet. 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 Procedure Date: 04/19/22. Mild mitral valve stenosis. The mean mitral valve gradient is 6.6 mmHg with average heart rate of 83 bpm.  Tricuspid Valve: The tricuspid valve is has been repaired/replaced. Tricuspid valve regurgitation is moderate. The tricuspid  valve is status post repair with an annuloplasty ring.  Aortic Valve: The aortic valve is tricuspid. Aortic valve regurgitation is not visualized. No aortic stenosis is present.  Pulmonic Valve: The pulmonic valve was normal in structure. Pulmonic valve regurgitation is not visualized.  Aorta: The aortic root, ascending aorta, aortic arch and descending aorta are all structurally normal, with no evidence of dilitation or obstruction. There is minimal (Grade I) plaque.  IAS/Shunts: No atrial level shunt detected by color flow Doppler.   MITRAL VALVE             TRICUSPID VALVE MV Area (PHT): 2.48 cm  TV Area (PHT):  2.26 cm MV Mean grad:  6.6 mmHg  TV Mean grad:   1.3 mmHg MV Decel Time: 306 msec  TV PHT:         97 msec TR Peak grad:   49.6 mmHg TR Vmax:        352.00 cm/s  Mihai Croitoru MD Electronically signed by Sanda Klein MD Signature Date/Time: 07/28/2022/2:32:07 PM    Final   MONITORS  LONG TERM MONITOR-LIVE TELEMETRY (3-14 DAYS) 09/18/2022  Narrative   Patient had a minimum heart rate of 52 bpm, maximum heart rate of 176 bpm, and average heart rate of 75 bpm.   Predominant underlying rhythm was sinus rhythm.   Paroxysmal  atrial fibrillation, longest episode 4 minutes, fastest 130 bpm.   One run of NSVT  lasting 4 beats at longest.   Isolated PACs were occasional (1.5%).   Isolated PVCs were occasional (1.9%).   Triggered and diary events associated with sinus rhythm.  No cardiac etiology of syncope.              Recent Labs: 08/03/2022: TSH 1.840 08/12/2022: ALT 11; B Natriuretic Peptide 497.2 08/13/2022: Magnesium 1.6 08/24/2022: Hemoglobin 9.3; NT-Pro BNP 2,927; Platelets 239 10/18/2022: BUN 15; Creatinine, Ser 1.12; Potassium 4.4; Sodium 139  Recent Lipid Panel    Component Value Date/Time   CHOL 180 03/25/2016 0909   TRIG 122 03/25/2016 0909   HDL 39 (L) 03/25/2016 0909   CHOLHDL 4.6 03/25/2016 0909   VLDL 24 03/25/2016 0909   LDLCALC 117  03/25/2016 0909     Risk Assessment/Calculations:    CHA2DS2-VASc Score = 4   This indicates a 4.8% annual risk of stroke. The patient's score is based upon: CHF History: 1 HTN History: 1 Diabetes History: 1 Stroke History: 0 Vascular Disease History: 0 Age Score: 0 Gender Score: 1             Physical Exam:    VS:  BP 110/68   Pulse 84   Ht '5\' 11"'$  (1.803 m)   Wt 196 lb (88.9 kg)   LMP 12/06/1976   SpO2 94%   BMI 27.34 kg/m     Wt Readings from Last 3 Encounters:  11/19/22 196 lb (88.9 kg)  08/24/22 195 lb 9.6 oz (88.7 kg)  08/17/22 190 lb 8 oz (86.4 kg)    GEN:  Well nourished, well developed in no acute distress HEENT: Normal NECK: No JVD; No carotid bruits LYMPHATICS: No lymphadenopathy CARDIAC: RRR, no rubs, gallops systolic and diastolic murmur RESPIRATORY:  Clear to auscultation without rales, wheezing or rhonchi  ABDOMEN: Soft, non-tender, non-distended MUSCULOSKELETAL:  No edema; No deformity  SKIN: Warm and dry NEUROLOGIC:  Alert and oriented x 3 PSYCHIATRIC:  Normal affect   ASSESSMENT:    1. Tobacco use   2. Hypertension associated with diabetes ([place])   3. PAF (paroxysmal atrial fibrillation) ([place])   4. S/P MVR/TVR  04/19/22   5. Rheumatic disease of heart valve     PLAN:    Labile BP Complicated by pain medication use, active tobacco abuse, and COPD - This is the best heart blood pressure and symptoms have been in over one year - She does not know what she is taking: she will call in and clarify - I suspect we will continue her diltiazem, take norvasc off of her list, take metoprolol off of her list, continue low dose lasix, and continue losartan   Mitral stenosis Rheumatic heart disease S/ MVR and TVR - continue current BP regimen - Medications as above - low thershold  for exercise RHC  Hld - continue statin  GI bleed PAF (valvular) - Warfarin; seeing coumadin clinic  December f/u me or APP         Medication  Adjustments/Labs and Tests Ordered: Current medicines are reviewed at length with the patient today.  Concerns regarding medicines are outlined above.  No orders of the defined types were placed in this encounter.  No orders of the defined types were placed in this encounter.   Patient Instructions  Medication Instructions:  Your physician recommends that you continue on your current medications as directed. Please refer to the Current Medication list given to  you today.  *If you need a refill on your cardiac medications before your next appointment, please call your pharmacy*   Follow-Up: At [facility], you and your health needs are our priority.  As part of our continuing mission to provide you with exceptional heart care, we have created designated Provider Care Teams.  These Care Teams include your primary Cardiologist (physician) and Advanced Practice Providers (APPs -  Physician Assistants and Nurse Practitioners) who all work together to provide you with the care you need, when you need it.   Your next appointment:   3 -4 month(s)  The format for your next appointment:   In Person  Provider:   Werner Lean, MD     Other Instructions Please call us to verify what medications you are taking   Important Information About Sugar         Signed, Werner Lean, MD  11/19/2022 9:05 AM    [place]

## 2022-11-19 ENCOUNTER — Encounter: Payer: Self-pay | Admitting: Internal Medicine

## 2022-11-19 ENCOUNTER — Ambulatory Visit: Payer: Medicare Other | Attending: Internal Medicine | Admitting: Internal Medicine

## 2022-11-19 VITALS — BP 110/68 | HR 84 | Ht 71.0 in | Wt 196.0 lb

## 2022-11-19 DIAGNOSIS — Z720 Tobacco use: Secondary | ICD-10-CM | POA: Diagnosis not present

## 2022-11-19 DIAGNOSIS — I480 Paroxysmal atrial fibrillation: Secondary | ICD-10-CM

## 2022-11-19 DIAGNOSIS — E1159 Type 2 diabetes mellitus with other circulatory complications: Secondary | ICD-10-CM | POA: Diagnosis not present

## 2022-11-19 DIAGNOSIS — I152 Hypertension secondary to endocrine disorders: Secondary | ICD-10-CM

## 2022-11-19 DIAGNOSIS — Z9889 Other specified postprocedural states: Secondary | ICD-10-CM

## 2022-11-19 DIAGNOSIS — I091 Rheumatic diseases of endocardium, valve unspecified: Secondary | ICD-10-CM

## 2022-11-19 NOTE — Patient Instructions (Signed)
Medication Instructions:  Your physician recommends that you continue on your current medications as directed. Please refer to the Current Medication list given to you today.  *If you need a refill on your cardiac medications before your next appointment, please call your pharmacy*   Follow-Up: At [facility], you and your health needs are our priority.  As part of our continuing mission to provide you with exceptional heart care, we have created designated Provider Care Teams.  These Care Teams include your primary Cardiologist (physician) and Advanced Practice Providers (APPs -  Physician Assistants and Nurse Practitioners) who all work together to provide you with the care you need, when you need it.   Your next appointment:   3 -4 month(s)  The format for your next appointment:   In Person  Provider:   Werner Lean, MD     Other Instructions Please call us to verify what medications you are taking   Important Information About Sugar

## 2022-11-30 ENCOUNTER — Telehealth: Payer: Self-pay | Admitting: Internal Medicine

## 2022-11-30 NOTE — Telephone Encounter (Signed)
Patient called back to add that she had weight gain from 195 lbs to 208.3 lbs within a week.

## 2022-11-30 NOTE — Telephone Encounter (Signed)
Patient called to update her medication list:  Lubiprostone 24 mg Vitamin B '100mg'$  Caltrate Tiadylt-ER '300mg'$  Amlodipine '5mg'$  2x daily

## 2022-11-30 NOTE — Telephone Encounter (Signed)
Med list updated ./cy

## 2022-11-30 NOTE — Telephone Encounter (Signed)
Returned call to patient. She has verified her med list earlier today with Devra Dopp, RN. She condones a 13lb weight gain in 1 week. States "it hurts to walk" and says she cannot get her shoes on. Denies CP/SOB or increased abdominal girth. Pt added on DOD slot for tomorrow with JV.

## 2022-11-30 NOTE — Telephone Encounter (Signed)
  Pt c/o swelling: STAT is pt has developed SOB within 24 hours  If swelling, where is the swelling located? Both Feet and legs  How much weight have you gained and in what time span? Not sure   Have you gained 3 pounds in a day or 5 pounds in a week? Not sure   Do you have a log of your daily weights (if so, list)? Not sure   Are you currently taking a fluid pill? Yes   Are you currently SOB? No  Have you traveled recently?  No   Pt said, her feet and legs are swelling, it was so bad he can't walk or wear shoes. She wants to see Dr. Gasper Sells or what his recommendation

## 2022-12-01 ENCOUNTER — Ambulatory Visit: Payer: Medicare Other | Attending: Interventional Cardiology | Admitting: Interventional Cardiology

## 2022-12-01 ENCOUNTER — Encounter: Payer: Self-pay | Admitting: Interventional Cardiology

## 2022-12-01 VITALS — BP 114/72 | HR 80 | Ht 71.0 in | Wt 207.0 lb

## 2022-12-01 DIAGNOSIS — M7989 Other specified soft tissue disorders: Secondary | ICD-10-CM | POA: Diagnosis not present

## 2022-12-01 DIAGNOSIS — I5032 Chronic diastolic (congestive) heart failure: Secondary | ICD-10-CM | POA: Diagnosis not present

## 2022-12-01 DIAGNOSIS — R635 Abnormal weight gain: Secondary | ICD-10-CM

## 2022-12-01 DIAGNOSIS — I480 Paroxysmal atrial fibrillation: Secondary | ICD-10-CM

## 2022-12-01 MED ORDER — FUROSEMIDE 40 MG PO TABS: ORAL_TABLET | ORAL | 1 refills | Status: DC

## 2022-12-01 NOTE — Patient Instructions (Signed)
Medication Instructions:  Your physician has recommended you make the following change in your medication:  Increase furosemide to 40 mg by mouth daily for 3 days.  After this take furosemide 20 mg -40 mg by mouth daily as needed for swelling  *If you need a refill on your cardiac medications before your next appointment, please call your pharmacy*   Lab Work:  Your physician recommends that you return for lab work in 2 weeks on day of appointment--BMP  Lab work done today--CBC, TSH, BMP and BNP If you have labs (blood work) drawn today and your tests are completely normal, you will receive your results only by: [place] (if you have [place]) OR A paper copy in the mail If you have any lab test that is abnormal or we need to change your treatment, we will call you to review the results.   Testing/Procedures: none   Follow-Up: At [facility], you and your health needs are our priority.  As part of our continuing mission to provide you with exceptional heart care, we have created designated Provider Care Teams.  These Care Teams include your primary Cardiologist (physician) and Advanced Practice Providers (APPs -  Physician Assistants and Nurse Practitioners) who all work together to provide you with the care you need, when you need it.  We recommend signing up for the patient portal called "MyChart".  Sign up information is provided on this After Visit Summary.  MyChart is used to connect with patients for Virtual Visits (Telemedicine).  Patients are able to view lab/test results, encounter notes, upcoming appointments, etc.  Non-urgent messages can be sent to your provider as well.   To learn more about what you can do with MyChart, go to [email or web address].    Your next appointment:   2 week(s)  The format for your next appointment:   In Person  Provider:   Dr Gasper Sells or APP    Other Instructions    Important Information About Sugar

## 2022-12-01 NOTE — Progress Notes (Signed)
Cardiology Office Note   Date:  12/01/2022   ID:  [name], DOB 06-Aug-1961, MRN [id number]  PCP:  [facility]    No chief complaint on file.    Wt Readings from Last 3 Encounters:  12/01/22 207 lb (93.9 kg)  11/19/22 196 lb (88.9 kg)  08/24/22 195 lb 9.6 oz (88.7 kg)       History of Present Illness: [name] is a 61 y.o. female  who sees Dr. Gasper Sells.  She was placed on the DOD schedule due to weight gain.  Prior records show she is a patient: "With a hx of rheumatic heart disease (MV and TV) who underwent MVR and TVR in 2023.  She has had 10 ED visits or hospitalizations since surgery in May. 2023[name] a pericardial effusion and tamponade in 05/2022.  She has had GI bleed and hypotension, she was last returned to warfarin, she has received blood transfusions and GI evaluation.  She had had hypotension and at one point was on midodrine.  She has had repeat TEE without evidence of severe MS.  She had profound bradycardia with confusion related to her beta blockers.  She had had severe electrolyte derrangement. She was diagnosed with COPD.  Labile BP with Pain med we backed of some of her AV nodal therapy."   Had home oxygen after the surgery for a short time.  No longer has it.  Noted increase swelling.  Taking Lasix every other day. Mild shortness of breath with exertion.  Swelling has been decreasing of late with leg elevation.   Past Medical History:  Diagnosis Date   Acute pericardial effusion 05/17/2022   AKI (acute kidney injury) ([place]) 05/17/2022   Anxiety    Arthritis    Cervicalgia    CHF (congestive heart failure) (HCC)    Chondromalacia of patella    Chronic neck pain    Chronic pain syndrome    Depression    secondary to loss of her son at age 56   Diabetic neuropathy ([place])    DMII (diabetes mellitus, type 2) ([place])    Dysrhythmia    Enthesopathy of hip region    Fibromyalgia    GERD (gastroesophageal reflux disease)     Hep C w/o coma, chronic ([place])    Hepatitis C    diagnosed 2005   Hypertension    Insomnia    Low back pain    Lumbago    Mitral regurgitation    Obesity    Pain in joint, upper arm    Primary localized osteoarthrosis, lower leg    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   Substance abuse ([place])    sober since 2001   Tobacco abuse    Tricuspid regurgitation    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    Past Surgical History:  Procedure Laterality Date   ABDOMINAL HYSTERECTOMY     at age 20, unknown reasons   CARPAL TUNNEL RELEASE  12/07/2011   left side   COLONOSCOPY WITH PROPOFOL N/A 07/30/2022   Procedure: COLONOSCOPY WITH PROPOFOL;  Surgeon: Ladene Artist, MD;  Location: [place];  Service: Gastroenterology;  Laterality: N/A;   EYE SURGERY     FOOT SURGERY Bilateral    HOT HEMOSTASIS N/A 07/30/2022   Procedure: HOT HEMOSTASIS (ARGON PLASMA COAGULATION/BICAP);  Surgeon: Ladene Artist, MD;  Location: [place];  Service: Gastroenterology;  Laterality: N/A;   MITRAL VALVE REPAIR N/A 04/19/2022   Procedure: MITRAL  VALVE REPAIR WITH 30MM PHYSIO II ANNULOPLASTY RING;  Surgeon: Melrose Nakayama, MD;  Location: [place];  Service: Open Heart Surgery;  Laterality: N/A;   PERICARDIOCENTESIS N/A 05/10/2022   Procedure: PERICARDIOCENTESIS;  Surgeon: Jettie Booze, MD;  Location: [place] CV LAB;  Service: Cardiovascular;  Laterality: N/A;   POLYPECTOMY  07/30/2022   Procedure: POLYPECTOMY;  Surgeon: Ladene Artist, MD;  [facility] ENDOSCOPY;  Service: Gastroenterology;;   RIGHT/LEFT HEART CATH AND CORONARY ANGIOGRAPHY N/A 02/12/2022   Procedure: RIGHT/LEFT HEART CATH AND CORONARY ANGIOGRAPHY;  Surgeon: Belva Crome, MD;  Location: [place] CV LAB;  Service: Cardiovascular;  Laterality: N/A;   TEE WITHOUT CARDIOVERSION N/A 02/03/2022   Procedure: TRANSESOPHAGEAL ECHOCARDIOGRAM (TEE);  Surgeon: Josue Hector, MD;  Location: [facility] ENDOSCOPY;   Service: Cardiovascular;  Laterality: N/A;   TEE WITHOUT CARDIOVERSION N/A 04/19/2022   Procedure: TRANSESOPHAGEAL ECHOCARDIOGRAM (TEE);  Surgeon: Melrose Nakayama, MD;  Location: [place];  Service: Open Heart Surgery;  Laterality: N/A;   TEE WITHOUT CARDIOVERSION N/A 07/28/2022   Procedure: TRANSESOPHAGEAL ECHOCARDIOGRAM (TEE);  Surgeon: Sanda Klein, MD;  Location: [place];  Service: Cardiovascular;  Laterality: N/A;   TRICUSPID VALVE REPLACEMENT N/A 04/19/2022   Procedure: TRICUSPID VALVE REPAIR WITH 32MM MC3 ANNULOPLASTY RING;  Surgeon: Melrose Nakayama, MD;  Location: [place];  Service: Open Heart Surgery;  Laterality: N/A;     Current Outpatient Medications  Medication Sig Dispense Refill   acetaminophen (TYLENOL) 500 MG tablet Take 1,000 mg by mouth every 6 (six) hours as needed for mild pain.     albuterol (PROVENTIL HFA;VENTOLIN HFA) 108 (90 Base) MCG/ACT inhaler Inhale 2 puffs into the lungs every 6 (six) hours as needed for wheezing or shortness of breath.      amLODipine (NORVASC) 5 MG tablet Take 1 tablet (5 mg total) by mouth daily. (Patient taking differently: Take 10 mg by mouth daily.) 90 tablet 3   ARIPiprazole (ABILIFY) 5 MG tablet Take 2.5 mg by mouth daily.     atorvastatin (LIPITOR) 20 MG tablet Take 20 mg by mouth daily.     B Complex Vitamins (VITAMIN B-COMPLEX 100 IJ) Inject as directed.     buPROPion (WELLBUTRIN XL) 300 MG 24 hr tablet Take 300 mg by mouth in the morning.     calcium carbonate (OSCAL) 1500 (600 Ca) MG TABS tablet Take 1,500 mg by mouth daily with breakfast.     dexlansoprazole (DEXILANT) 60 MG capsule Take 1 capsule by mouth daily.     diltiazem (CARDIZEM CD) 300 MG 24 hr capsule Take 300 mg by mouth daily.     diltiazem (TIADYLT ER) 300 MG 24 hr capsule Take 300 mg by mouth daily.     FARXIGA 10 MG TABS tablet Take 10 mg by mouth daily.     FLUoxetine (PROZAC) 20 MG capsule Take 20 mg by mouth daily.     furosemide (LASIX) 40 MG tablet  Take one tablet by mouth daily as needed for swelling 90 tablet 1   gabapentin (NEURONTIN) 300 MG capsule Take 300 mg by mouth 3 (three) times daily.     linagliptin (TRADJENTA) 5 MG TABS tablet Take 5 mg by mouth daily.     lubiprostone (AMITIZA) 24 MCG capsule Take 24 mcg by mouth 2 (two) times daily with a meal.     naloxone (NARCAN) nasal spray 4 mg/0.1 mL Place 1 spray into the nose as needed (accidental overdose).     neomycin-polymyxin b-dexamethasone (MAXITROL)  3.5-10000-0.1 SUSP Place 1 drop into both eyes as needed (itching).     omeprazole (PRILOSEC) 10 MG capsule Take 10 mg by mouth daily.     oxyCODONE (ROXICODONE) 15 MG immediate release tablet Take 0.5 tablets (7.5 mg total) by mouth every 12 (twelve) hours as needed for pain. 30 tablet 0   PREMARIN 0.45 MG tablet Take 0.45 mg by mouth daily.     traZODone (DESYREL) 100 MG tablet Take 1 tablet (100 mg total) by mouth at bedtime as needed for sleep. 30 tablet 0   TRINTELLIX 10 MG TABS tablet Take 10 mg by mouth daily.     warfarin (COUMADIN) 5 MG tablet TAKE 1 TABLET BY MOUTH DAILY EXCEPT 1.5 TABLETS BY MOUTH ON SUNDAYS AS DIRECTED BY COUMADIN CLINIC 105 tablet 1   losartan (COZAAR) 25 MG tablet Take 1 tablet (25 mg total) by mouth daily. 30 tablet 0   No current facility-administered medications for this visit.    Allergies:   Norco [hydrocodone-acetaminophen]    Social History:  The patient  reports that she has been smoking cigarettes. She has a 8.75 pack-year smoking history. She has never used smokeless tobacco. She reports that she does not drink alcohol and does not use drugs.   Family History:  The patient's family history includes Alcohol abuse in her brother, father, and sister; Arthritis in an other family member; Diabetes in an other family member; Drug abuse in her brother and sister; Heart attack (age of onset: 58) in her son; Hypertension in an other family member; Schizophrenia in her son; Uterine cancer in her  mother.    ROS:  Please see the history of present illness.   Otherwise, review of systems are positive for leg edema.   All other systems are reviewed and negative.    PHYSICAL EXAM: VS:  BP 114/72   Pulse 80   Ht _0  (1.803 m)   Wt 207 lb (93.9 kg)   LMP 12/06/1976   SpO2 (!) 86%   BMI 28.87 kg/m  , BMI Body mass index is 28.87 kg/m. GEN: Well nourished, well developed, in no acute distress HEENT: normal Neck: no JVD, carotid bruits, or masses Cardiac: RRR; 2/6 systolic murmurs, rubs, or gallops,; bilateral pitting lower extremity edema  Respiratory:  clear to auscultation bilaterally, normal work of breathing GI: soft, nontender, nondistended, + BS MS: no deformity or atrophy Skin: warm and dry, no rash Neuro:  Strength and sensation are intact Psych: euthymic mood, full affect   EKG:   The ekg ordered today demonstrates normal sinus rhythm, nonspecific ST-T wave changes   Recent Labs: 08/03/2022: TSH 1.840 08/12/2022: ALT 11; B Natriuretic Peptide 497.2 08/13/2022: Magnesium 1.6 08/24/2022: Hemoglobin 9.3; NT-Pro BNP 2,927; Platelets 239 10/18/2022: BUN 15; Creatinine, Ser 1.12; Potassium 4.4; Sodium 139   Lipid Panel    Component Value Date/Time   CHOL 180 03/25/2016 0909   TRIG 122 03/25/2016 0909   HDL 39 (L) 03/25/2016 0909   CHOLHDL 4.6 03/25/2016 0909   VLDL 24 03/25/2016 0909   LDLCALC 117 03/25/2016 0909     Other studies Reviewed: Additional studies/ records that were reviewed today with results demonstrating: LVEF 60 to 65% in September 2023.   ASSESSMENT AND PLAN:  Volume overload: Increase Lasix to 40 mg daily for 3 days.  After that time, she can take 20 to 40 mg daily depending on her weight and swelling.  Will check be met in 2 weeks.  Bmet and BNP  already checked today due to symptoms of shortness of breath.  Oxygen saturation was mildly decreased in the office, 86%.  The patient appeared comfortable.  Hopefully, this will improve with  diuresis.  She does have COPD and has used home oxygen in the past.  Will recheck in a few weeks. Status post MVR and TVR in 2023.  She needs SBE prophylaxis. Hypertension: Blood pressure well-controlled today.   Current medicines are reviewed at length with the patient today.  The patient concerns regarding her medicines were addressed.  The following changes have been made: As above  Labs/ tests ordered today include:   Orders Placed This Encounter  Procedures   Basic Metabolic Panel (BMET)   Pro b natriuretic peptide   CBC   TSH   Basic Metabolic Panel (BMET)   EKG 12-Lead    Recommend 150 minutes/week of aerobic exercise Low fat, low carb, high fiber diet recommended  Disposition:   FU in a few weeks to recheck O2 sats, fluid status   Signed, Larae Grooms, MD  12/01/2022 5:25 PM    [facility] [address], [place], [place]  [address] Phone: [phone]; Fax: [phone]

## 2022-12-02 LAB — BASIC METABOLIC PANEL
BUN/Creatinine Ratio: 9 — ABNORMAL LOW (ref 12–28)
BUN: 11 mg/dL (ref 8–27)
CO2: 18 mmol/L — ABNORMAL LOW (ref 20–29)
Calcium: 8.5 mg/dL — ABNORMAL LOW (ref 8.7–10.3)
Chloride: 104 mmol/L (ref 96–106)
Creatinine, Ser: 1.18 mg/dL — ABNORMAL HIGH (ref 0.57–1.00)
Glucose: 123 mg/dL — ABNORMAL HIGH (ref 70–99)
Potassium: 3.5 mmol/L (ref 3.5–5.2)
Sodium: 138 mmol/L (ref 134–144)
eGFR: 52 mL/min/{1.73_m2} — ABNORMAL LOW (ref >59)

## 2022-12-02 LAB — CBC
Hematocrit: 28.8 % — ABNORMAL LOW (ref 34.0–46.6)
Hemoglobin: 9 g/dL — ABNORMAL LOW (ref 11.1–15.9)
MCH: 24.8 pg — ABNORMAL LOW (ref 26.6–33.0)
MCHC: 31.3 g/dL — ABNORMAL LOW (ref 31.5–35.7)
MCV: 79 fL (ref 79–97)
NRBC: 1 % — ABNORMAL HIGH (ref 0–0)
Platelets: 313 10*3/uL (ref 150–450)
RBC: 3.63 x10E6/uL — ABNORMAL LOW (ref 3.77–5.28)
RDW: 17.5 % — ABNORMAL HIGH (ref 11.7–15.4)
WBC: 6.4 10*3/uL (ref 3.4–10.8)

## 2022-12-02 LAB — TSH: TSH: 1.78 u[IU]/mL (ref 0.450–4.500)

## 2022-12-02 LAB — PRO B NATRIURETIC PEPTIDE: NT-Pro BNP: 2487 pg/mL — ABNORMAL HIGH (ref 0–287)

## 2022-12-19 NOTE — Progress Notes (Unsigned)
Office Visit    Patient Name: [name] Date of Encounter: 12/20/2022  PCP:  [facility]  Cardiologist:  Werner Lean, MD  Advanced Practice Provider:  No care team member to display Electrophysiologist:  None   HPI    [name] is a 62 y.o. female with past medical history of rheumatic heart disease (MV and TV) underwent MVR and TVR in 2023, pericardial effusion and tamponade 05/2022, GI bleed, hypotension (on midodrine at 1 point), bradycardia related to her beta-blockers), severe electrolyte derangement, COPD, labile blood pressure who presents today for follow-up visit.  At the time of her pericardial effusion and tamponade 05/2022 she also had a GI bleed with hypotension.  She returned to warfarin after having blood transfusions and GI evaluation.  Had some hypotension and was on midodrine at one point.  Had repeat TEE without evidence of severe MS.  Had profound bradycardia with confusion related to beta-blockers.  Also has severe electrolyte derangement.  Diagnosis COPD.  Labile BP with pain medication and she was backed off of some of her AV nodal therapy.  She was on home oxygen after her surgery for short time.  No longer needing it.  Was taking Lasix every other day.  Has mild shortness of breath with exertion.  Swelling had decreased.  Surgery date for MVR and TVR was 05/17/2022.  Today, she remains on Lasix every other day for fluid overload.  We discussed getting some labs today to reevaluate.  Overall, she is doing well.  She did have some shortness of breath following her last appointment.  This resolved on its own.  Most likely related to her atrial fibrillation.  She still is struggling with some lower extremity edema.  Her weight continues to trend down.  She was taken off her beta-blocker and amlodipine at the last appointment due to hypotension.  Mild hypertension today. She is on coumadin for her  Afib.  Reports no shortness of breath nor dyspnea on exertion. Reports no chest pain, pressure, or tightness. No edema, orthopnea, PND. Reports no palpitations.    Past Medical History    Past Medical History:  Diagnosis Date   Acute pericardial effusion 05/17/2022   AKI (acute kidney injury) ([place]) 05/17/2022   Anxiety    Arthritis    Cervicalgia    CHF (congestive heart failure) (HCC)    Chondromalacia of patella    Chronic neck pain    Chronic pain syndrome    Depression    secondary to loss of her son at age 67   Diabetic neuropathy ([place])    DMII (diabetes mellitus, type 2) ([place])    Dysrhythmia    Enthesopathy of hip region    Fibromyalgia    GERD (gastroesophageal reflux disease)    Hep C w/o coma, chronic ([place])    Hepatitis C    diagnosed 2005   Hypertension    Insomnia    Low back pain    Lumbago    Mitral regurgitation    Obesity    Pain in joint, upper arm    Primary localized osteoarthrosis, lower leg    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   Substance abuse ([place])    sober since 2001   Tobacco abuse    Tricuspid regurgitation    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   Past Surgical History:  Procedure Laterality Date   ABDOMINAL HYSTERECTOMY     at age  17, unknown reasons   CARPAL TUNNEL RELEASE  12/07/2011   left side   COLONOSCOPY WITH PROPOFOL N/A 07/30/2022   Procedure: COLONOSCOPY WITH PROPOFOL;  Surgeon: Ladene Artist, MD;  Location: [facility];  Service: Gastroenterology;  Laterality: N/A;   EYE SURGERY     FOOT SURGERY Bilateral    HOT HEMOSTASIS N/A 07/30/2022   Procedure: HOT HEMOSTASIS (ARGON PLASMA COAGULATION/BICAP);  Surgeon: Ladene Artist, MD;  Location: [place];  Service: Gastroenterology;  Laterality: N/A;   MITRAL VALVE REPAIR N/A 04/19/2022   Procedure: MITRAL VALVE REPAIR WITH 30MM PHYSIO II ANNULOPLASTY RING;  Surgeon: Melrose Nakayama, MD;  Location: [place];  Service: Open Heart Surgery;   Laterality: N/A;   PERICARDIOCENTESIS N/A 05/10/2022   Procedure: PERICARDIOCENTESIS;  Surgeon: Jettie Booze, MD;  Location: [place] [facility];  Service: Cardiovascular;  Laterality: N/A;   POLYPECTOMY  07/30/2022   Procedure: POLYPECTOMY;  Surgeon: Ladene Artist, MD;  Location: [facility];  Service: Gastroenterology;;   RIGHT/LEFT HEART CATH AND CORONARY ANGIOGRAPHY N/A 02/12/2022   Procedure: RIGHT/LEFT HEART CATH AND CORONARY ANGIOGRAPHY;  Surgeon: Belva Crome, MD;  Location: [place] [facility];  Service: Cardiovascular;  Laterality: N/A;   TEE WITHOUT CARDIOVERSION N/A 02/03/2022   Procedure: TRANSESOPHAGEAL ECHOCARDIOGRAM (TEE);  Surgeon: Josue Hector, MD;  [facility];  Service: Cardiovascular;  Laterality: N/A;   TEE WITHOUT CARDIOVERSION N/A 04/19/2022   Procedure: TRANSESOPHAGEAL ECHOCARDIOGRAM (TEE);  Surgeon: Melrose Nakayama, MD;  Location: [place];  Service: Open Heart Surgery;  Laterality: N/A;   TEE WITHOUT CARDIOVERSION N/A 07/28/2022   Procedure: TRANSESOPHAGEAL ECHOCARDIOGRAM (TEE);  Surgeon: Sanda Klein, MD;  Location: [place];  Service: Cardiovascular;  Laterality: N/A;   TRICUSPID VALVE REPLACEMENT N/A 04/19/2022   Procedure: TRICUSPID VALVE REPAIR WITH 32MM MC3 ANNULOPLASTY RING;  Surgeon: Melrose Nakayama, MD;  Location: [place];  Service: Open Heart Surgery;  Laterality: N/A;    Allergies  Allergies  Allergen Reactions   Norco [Hydrocodone-Acetaminophen] Itching and Other (See Comments)    Tolerates Oxycodone     EKGs/Labs/Other Studies Reviewed:   The following studies were reviewed today:  Cardiac monitor 10/23   Patient had a minimum heart rate of 52 bpm, maximum heart rate of 176 bpm, and average heart rate of 75 bpm.   Predominant underlying rhythm was sinus rhythm.   Paroxysmal atrial fibrillation, longest episode 4 minutes, fastest 130 bpm.   One run of NSVT  lasting 4 beats at longest.   Isolated PACs were  occasional (1.5%).   Isolated PVCs were occasional (1.9%).   Triggered and diary events associated with sinus rhythm.   No cardiac etiology of syncope. EKG:  EKG is not ordered today.  Recent Labs: 08/12/2022: ALT 11; B Natriuretic Peptide 497.2 08/13/2022: Magnesium 1.6 12/01/2022: BUN 11; Creatinine, Ser 1.18; Hemoglobin 9.0; NT-Pro BNP 2,487; Platelets 313; Potassium 3.5; Sodium 138; TSH 1.780  Recent Lipid Panel    Component Value Date/Time   CHOL 180 03/25/2016 0909   TRIG 122 03/25/2016 0909   HDL 39 (L) 03/25/2016 0909   CHOLHDL 4.6 03/25/2016 0909   VLDL 24 03/25/2016 0909   LDLCALC 117 03/25/2016 0909    Risk Assessment/Calculations:   CHA2DS2-VASc Score = 4   This indicates a 4.8% annual risk of stroke. The patient's score is based upon: CHF History: 1 HTN History: 1 Diabetes History: 1 Stroke History: 0 Vascular Disease History: 0 Age Score: 0 Gender Score: 1  Home Medications   Current Meds  Medication Sig   acetaminophen (TYLENOL) 500 MG tablet Take 1,000 mg by mouth every 6 (six) hours as needed for mild pain.   albuterol (PROVENTIL HFA;VENTOLIN HFA) 108 (90 Base) MCG/ACT inhaler Inhale 2 puffs into the lungs every 6 (six) hours as needed for wheezing or shortness of breath.    amLODipine (NORVASC) 5 MG tablet Take 1 tablet (5 mg total) by mouth daily. (Patient taking differently: Take 10 mg by mouth daily.)   ARIPiprazole (ABILIFY) 5 MG tablet Take 2.5 mg by mouth daily.   atorvastatin (LIPITOR) 20 MG tablet Take 20 mg by mouth daily.   buPROPion (WELLBUTRIN XL) 300 MG 24 hr tablet Take 300 mg by mouth in the morning.   calcium carbonate (OSCAL) 1500 (600 Ca) MG TABS tablet Take 1,500 mg by mouth daily with breakfast.   dexlansoprazole (DEXILANT) 60 MG capsule Take 1 capsule by mouth daily.   diltiazem (CARDIZEM CD) 300 MG 24 hr capsule Take 300 mg by mouth daily.   FARXIGA 10 MG TABS tablet Take 10 mg by mouth daily.   FLUoxetine (PROZAC) 20 MG capsule  Take 20 mg by mouth daily.   furosemide (LASIX) 40 MG tablet Take one tablet by mouth daily as needed for swelling   gabapentin (NEURONTIN) 300 MG capsule Take 300 mg by mouth 3 (three) times daily.   linagliptin (TRADJENTA) 5 MG TABS tablet Take 5 mg by mouth daily.   lubiprostone (AMITIZA) 24 MCG capsule Take 24 mcg by mouth 2 (two) times daily with a meal.   metoprolol tartrate (LOPRESSOR) 25 MG tablet Take 0.5 tablets (12.5 mg total) by mouth 2 (two) times daily.   naloxone (NARCAN) nasal spray 4 mg/0.1 mL Place 1 spray into the nose as needed (accidental overdose).   neomycin-polymyxin b-dexamethasone (MAXITROL) 3.5-10000-0.1 SUSP Place 1 drop into both eyes as needed (itching).   omeprazole (PRILOSEC) 10 MG capsule Take 10 mg by mouth daily.   oxyCODONE (ROXICODONE) 15 MG immediate release tablet Take 15 mg by mouth every 4 (four) hours as needed for pain.   PREMARIN 0.45 MG tablet Take 0.45 mg by mouth daily.   traZODone (DESYREL) 100 MG tablet Take 100 mg by mouth at bedtime. Pt takes 2 tablets 200 mg at bedtime.   TRINTELLIX 10 MG TABS tablet Take 10 mg by mouth daily.   warfarin (COUMADIN) 5 MG tablet TAKE 1 TABLET BY MOUTH DAILY EXCEPT 1.5 TABLETS BY MOUTH ON SUNDAYS AS DIRECTED BY COUMADIN CLINIC     Review of Systems      All other systems reviewed and are otherwise negative except as noted above.  Physical Exam    VS:  BP (!) 142/80   Pulse (!) 103   Ht '5\' 11"'$  (1.803 m)   Wt 200 lb (90.7 kg)   LMP 12/06/1976   SpO2 95%   BMI 27.89 kg/m  , BMI Body mass index is 27.89 kg/m.  Wt Readings from Last 3 Encounters:  12/20/22 200 lb (90.7 kg)  12/01/22 207 lb (93.9 kg)  11/19/22 196 lb (88.9 kg)     GEN: Well nourished, well developed, in no acute distress. HEENT: normal. Neck: Supple, no JVD, carotid bruits, or masses. Cardiac: irregularly irregular, no murmurs, rubs, or gallops. No clubbing, cyanosis, 1+ pitting bilateral edema.  Radials/PT 2+ and equal bilaterally.   Respiratory:  Respirations regular and unlabored, clear to auscultation bilaterally. GI: Soft, nontender, nondistended. MS: No deformity or atrophy. Skin: Warm  and dry, no rash. Neuro:  Strength and sensation are intact. Psych: Normal affect.  Assessment & Plan    Volume overload and continues LE edema -continue lasix every other day -BMP and CBC today -she is still having 1+ pitting edema -Recommend elevating feet and lower ext compression  Status post MVR and TVR in 04/2022 -echo reviewed from 08/2022 -mild MS and mild MR postop -mild to moderate TR postop -continue lasix '40mg'$  every other day -check labs today  Hypertension -added metoprolol today 12.'5mg'$  BID -continue to monitor at home  Tobacco use -got down to three a day -continue to work on cessation  PAF with recent monitor 10/23 -working on better rate control and I think her BP and HR can now tolerate low dose metoprolol. -continue coumadin for anticoagulation and stroke prevention -CHA2DS2-VASc score of 4  6. Anemia -she endorses pagophagia and this could be linked to anemia/iron def -will ordered CBC to evaluate -she remains on iron supplementation -last hemoglobin was 9.0   Disposition: Follow up 2 months with Werner Lean, MD or APP.  Signed, Elgie Collard, PA-C 12/20/2022, 12:30 PM [facility]

## 2022-12-20 ENCOUNTER — Encounter: Payer: Self-pay | Admitting: Physician Assistant

## 2022-12-20 ENCOUNTER — Ambulatory Visit: Payer: 59 | Attending: Physician Assistant | Admitting: Physician Assistant

## 2022-12-20 VITALS — BP 142/80 | HR 103 | Ht 71.0 in | Wt 200.0 lb

## 2022-12-20 DIAGNOSIS — I480 Paroxysmal atrial fibrillation: Secondary | ICD-10-CM

## 2022-12-20 DIAGNOSIS — I091 Rheumatic diseases of endocardium, valve unspecified: Secondary | ICD-10-CM | POA: Diagnosis not present

## 2022-12-20 DIAGNOSIS — Z720 Tobacco use: Secondary | ICD-10-CM | POA: Diagnosis not present

## 2022-12-20 DIAGNOSIS — I10 Essential (primary) hypertension: Secondary | ICD-10-CM

## 2022-12-20 DIAGNOSIS — Z9889 Other specified postprocedural states: Secondary | ICD-10-CM

## 2022-12-20 MED ORDER — METOPROLOL TARTRATE 25 MG PO TABS: 12.5000 mg | ORAL_TABLET | Freq: Two times a day (BID) | ORAL | 3 refills | Status: DC

## 2022-12-20 NOTE — Patient Instructions (Signed)
Medication Instructions:  1.Start metoprolol tartrate 12.5 mg twice a day, this will be 1/2 of a 25 mg tablet twice daily *If you need a refill on your cardiac medications before your next appointment, please call your pharmacy*   Lab Work: BMP, CBC and Mag today If you have labs (blood work) drawn today and your tests are completely normal, you will receive your results only by: [place] (if you have MyChart) OR A paper copy in the mail If you have any lab test that is abnormal or we need to change your treatment, we will call you to review the results.   Follow-Up: At [facility], you and your health needs are our priority.  As part of our continuing mission to provide you with exceptional heart care, we have created designated Provider Care Teams.  These Care Teams include your primary Cardiologist (physician) and Advanced Practice Providers (APPs -  Physician Assistants and Nurse Practitioners) who all work together to provide you with the care you need, when you need it.   Your next appointment:   02/28/2023 at 9:00 AM as scheduled  Provider:   Werner Lean, MD   Other Instructions Check your blood pressure at home and let us know if the systolic (top number) is less than 110

## 2022-12-21 LAB — BASIC METABOLIC PANEL
BUN/Creatinine Ratio: 8 — ABNORMAL LOW (ref 12–28)
BUN: 9 mg/dL (ref 8–27)
CO2: 22 mmol/L (ref 20–29)
Calcium: 8.4 mg/dL — ABNORMAL LOW (ref 8.7–10.3)
Chloride: 104 mmol/L (ref 96–106)
Creatinine, Ser: 1.1 mg/dL — ABNORMAL HIGH (ref 0.57–1.00)
Glucose: 122 mg/dL — ABNORMAL HIGH (ref 70–99)
Potassium: 3.6 mmol/L (ref 3.5–5.2)
Sodium: 140 mmol/L (ref 134–144)
eGFR: 56 mL/min/{1.73_m2} — ABNORMAL LOW (ref >59)

## 2022-12-21 LAB — CBC
Hematocrit: 31.7 % — ABNORMAL LOW (ref 34.0–46.6)
Hemoglobin: 9.5 g/dL — ABNORMAL LOW (ref 11.1–15.9)
MCH: 24.5 pg — ABNORMAL LOW (ref 26.6–33.0)
MCHC: 30 g/dL — ABNORMAL LOW (ref 31.5–35.7)
MCV: 82 fL (ref 79–97)
Platelets: 256 10*3/uL (ref 150–450)
RBC: 3.88 x10E6/uL (ref 3.77–5.28)
RDW: 18.8 % — ABNORMAL HIGH (ref 11.7–15.4)
WBC: 4.6 10*3/uL (ref 3.4–10.8)

## 2022-12-21 LAB — MAGNESIUM: Magnesium: 1.7 mg/dL (ref 1.6–2.3)

## 2022-12-22 ENCOUNTER — Other Ambulatory Visit: Payer: Self-pay | Admitting: *Deleted

## 2022-12-22 DIAGNOSIS — Z79899 Other long term (current) drug therapy: Secondary | ICD-10-CM

## 2022-12-22 MED ORDER — POTASSIUM CHLORIDE CRYS ER 20 MEQ PO TBCR: EXTENDED_RELEASE_TABLET | ORAL | 3 refills | Status: DC

## 2022-12-28 ENCOUNTER — Ambulatory Visit: Payer: 59 | Attending: Cardiology

## 2022-12-28 DIAGNOSIS — I480 Paroxysmal atrial fibrillation: Secondary | ICD-10-CM

## 2022-12-28 DIAGNOSIS — Z5181 Encounter for therapeutic drug level monitoring: Secondary | ICD-10-CM | POA: Diagnosis not present

## 2022-12-28 DIAGNOSIS — Z9889 Other specified postprocedural states: Secondary | ICD-10-CM | POA: Diagnosis not present

## 2022-12-28 LAB — POCT INR: INR: 3.2 — AB (ref 2.0–3.0)

## 2022-12-28 NOTE — Patient Instructions (Signed)
Description   Skip today's dosage of Warfarin, then resume same dosage 1 tablet daily of Warfarin except 1.5 tablets on Sundays. Recheck INR in 4 weeks. Anticoagulation Clinic [phone]

## 2023-01-05 ENCOUNTER — Ambulatory Visit (INDEPENDENT_AMBULATORY_CARE_PROVIDER_SITE_OTHER): Payer: 59 | Admitting: Podiatry

## 2023-01-05 DIAGNOSIS — B351 Tinea unguium: Secondary | ICD-10-CM

## 2023-01-05 DIAGNOSIS — M79674 Pain in right toe(s): Secondary | ICD-10-CM | POA: Diagnosis not present

## 2023-01-05 DIAGNOSIS — M79675 Pain in left toe(s): Secondary | ICD-10-CM

## 2023-01-05 NOTE — Progress Notes (Signed)
Subjective:   Patient ID: [name], female   DOB: 62 y.o.   MRN: [id number]   HPI Patient presents painful nailbeds 1-5 both feet thick and incurvated cannot take care of herself   ROS      Objective:  Physical Exam  Neurovascular status intact thick yellow brittle nailbeds 1-5 both feet painful     Assessment:  Chronic mycotic nail infection with pain 1-5 both feet     Plan:  Debridement nailbeds 1-5 both feet no angiogenic bleeding reappoint routine care

## 2023-01-25 ENCOUNTER — Ambulatory Visit: Payer: 59 | Attending: Cardiology | Admitting: *Deleted

## 2023-01-25 DIAGNOSIS — Z5181 Encounter for therapeutic drug level monitoring: Secondary | ICD-10-CM

## 2023-01-25 DIAGNOSIS — I480 Paroxysmal atrial fibrillation: Secondary | ICD-10-CM | POA: Diagnosis not present

## 2023-01-25 DIAGNOSIS — Z9889 Other specified postprocedural states: Secondary | ICD-10-CM

## 2023-01-25 LAB — POCT INR: INR: 3.4 — AB (ref 2.0–3.0)

## 2023-01-25 NOTE — Patient Instructions (Signed)
Description   Skip today's dosage of Warfarin, then START taking 1 tablet daily of Warfarin. Recheck INR in 3 weeks. Anticoagulation Clinic [phone]

## 2023-02-15 ENCOUNTER — Ambulatory Visit: Payer: 59 | Attending: Cardiovascular Disease | Admitting: *Deleted

## 2023-02-15 DIAGNOSIS — I480 Paroxysmal atrial fibrillation: Secondary | ICD-10-CM | POA: Diagnosis not present

## 2023-02-15 DIAGNOSIS — Z5181 Encounter for therapeutic drug level monitoring: Secondary | ICD-10-CM

## 2023-02-15 DIAGNOSIS — Z9889 Other specified postprocedural states: Secondary | ICD-10-CM | POA: Diagnosis not present

## 2023-02-15 LAB — POCT INR: INR: 3.2 — AB (ref 2.0–3.0)

## 2023-02-15 NOTE — Patient Instructions (Signed)
Description   Today take 1/2 tablet of warfarin then START taking 1 tablet daily of Warfarin daily except 1/2 on every Tuesday. Recheck INR in 3 weeks. Anticoagulation Clinic [phone]

## 2023-02-24 NOTE — Progress Notes (Signed)
Cardiology Office Note:    Date:  02/28/2023   ID:  [name], DOB 1961/06/07, MRN [id number]  PCP:  Basilio Cairo, [place] [facility] Cardiologist:  Werner Lean, MD     Referring MD: [facility] follow up  History of Present Illness:    [name] is a 62 y.o. female with a hx of rheumatic heart disease (MV and TV) who underwent MVR and TVR in 2023.  She has had 10 ED visits or hospitalizations since surgery in May. 2023[name] a pericardial effusion and tamponade in 05/2022.  She has had GI bleed and hypotension, she was last returned to warfarin, she has received blood transfusions and GI evaluation.  She had had hypotension and at one point was on midodrine.  She has had repeat TEE without evidence of severe MS.  She had profound bradycardia with confusion related to her beta blockers.  She had had severe electrolyte derrangement. She was diagnosed with COPD.  Labile BP with Pain med we backed of some of her AV nodal therapy. 2024: needed some lasix every day down to a few   Lasix is now daily.  Patient notes that she is doing well.   There are no interval hospital/ED visit.   Is down to three cigarettes a day.   No chest pain or pressure .  No SOB/DOE and no PND/Orthopnea.  No weight gain or leg swelling.  No palpitations or syncope.  Has lost weight intentionally.  Improved strength.   Past Medical History:  Diagnosis Date   Acute pericardial effusion 05/17/2022   AKI (acute kidney injury) ([place]) 05/17/2022   Anxiety    Arthritis    Cervicalgia    CHF (congestive heart failure) (HCC)    Chondromalacia of patella    Chronic neck pain    Chronic pain syndrome    Depression    secondary to loss of her son at age 26   Diabetic neuropathy ([place])    DMII (diabetes mellitus, type 2) ([place])    Dysrhythmia    Enthesopathy of hip region    Fibromyalgia    GERD (gastroesophageal reflux disease)    Hep C  w/o coma, chronic ([place])    Hepatitis C    diagnosed 2005   Hypertension    Insomnia    Low back pain    Lumbago    Mitral regurgitation    Obesity    Pain in joint, upper arm    Primary localized osteoarthrosis, lower leg    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   Substance abuse ([place])    sober since 2001   Tobacco abuse    Tricuspid regurgitation    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    Past Surgical History:  Procedure Laterality Date   ABDOMINAL HYSTERECTOMY     at age 51, unknown reasons   CARPAL TUNNEL RELEASE  12/07/2011   left side   COLONOSCOPY WITH PROPOFOL N/A 07/30/2022   Procedure: COLONOSCOPY WITH PROPOFOL;  Surgeon: Ladene Artist, MD;  Location: [place];  Service: Gastroenterology;  Laterality: N/A;   EYE SURGERY     FOOT SURGERY Bilateral    HOT HEMOSTASIS N/A 07/30/2022   Procedure: HOT HEMOSTASIS (ARGON PLASMA COAGULATION/BICAP);  Surgeon: Ladene Artist, MD;  Location: [facility];  Service: Gastroenterology;  Laterality: N/A;   MITRAL VALVE REPAIR N/A 04/19/2022   Procedure: MITRAL VALVE REPAIR WITH 30MM PHYSIO  II ANNULOPLASTY RING;  Surgeon: Melrose Nakayama, MD;  Location: [place];  Service: Open Heart Surgery;  Laterality: N/A;   PERICARDIOCENTESIS N/A 05/10/2022   Procedure: PERICARDIOCENTESIS;  Surgeon: Jettie Booze, MD;  Location: [place];  Service: Cardiovascular;  Laterality: N/A;   POLYPECTOMY  07/30/2022   Procedure: POLYPECTOMY;  Surgeon: Ladene Artist, MD;  Location: [facility] ENDOS[facility];  Service: Gastroenterology;;   RIGHT/LEFT HEART CATH AND CORONARY ANGIOGRAPHY N/A 02/12/2022   Procedure: RIGHT/LEFT HEART CATH AND CORONARY ANGIOGRAPHY;  Surgeon: Belva Crome, MD;  Location: [facility];  Service: Cardiovascular;  Laterality: N/A;   TEE WITHOUT CARDIOVERSION N/A 02/03/2022   Procedure: TRANSESOPHAGEAL ECHOCARDIOGRAM (TEE);  Surgeon: Josue Hector, MD;  [facility] ENDOSCOPY;  Service:  Cardiovascular;  Laterality: N/A;   TEE WITHOUT CARDIOVERSION N/A 04/19/2022   Procedure: TRANSESOPHAGEAL ECHOCARDIOGRAM (TEE);  Surgeon: Melrose Nakayama, MD;  Location: [place];  Service: Open Heart Surgery;  Laterality: N/A;   TEE WITHOUT CARDIOVERSION N/A 07/28/2022   Procedure: TRANSESOPHAGEAL ECHOCARDIOGRAM (TEE);  Surgeon: Sanda Klein, MD;  Location: [place];  Service: Cardiovascular;  Laterality: N/A;   TRICUSPID VALVE REPLACEMENT N/A 04/19/2022   Procedure: TRICUSPID VALVE REPAIR WITH 32MM MC3 ANNULOPLASTY RING;  Surgeon: Melrose Nakayama, MD;  Location: [place];  Service: Open Heart Surgery;  Laterality: N/A;    Current Medications: Current Meds  Medication Sig   acetaminophen (TYLENOL) 500 MG tablet Take 1,000 mg by mouth every 6 (six) hours as needed for mild pain.   albuterol (PROVENTIL HFA;VENTOLIN HFA) 108 (90 Base) MCG/ACT inhaler Inhale 2 puffs into the lungs every 6 (six) hours as needed for wheezing or shortness of breath.    ARIPiprazole (ABILIFY) 5 MG tablet Take 2.5 mg by mouth daily.   atorvastatin (LIPITOR) 20 MG tablet Take 20 mg by mouth daily.   baclofen (LIORESAL) 10 MG tablet Take by mouth 2 (two) times daily.   buPROPion (WELLBUTRIN XL) 300 MG 24 hr tablet Take 300 mg by mouth in the morning.   calcium carbonate (OSCAL) 1500 (600 Ca) MG TABS tablet Take 1,500 mg by mouth daily with breakfast.   cycloSPORINE (RESTASIS) 0.05 % ophthalmic emulsion Apply to eye as needed (dry eyes).   dexlansoprazole (DEXILANT) 60 MG capsule Take 1 capsule by mouth daily.   diclofenac Sodium (VOLTAREN) 1 % GEL Apply topically as needed (pain).   diltiazem (CARDIZEM CD) 300 MG 24 hr capsule Take 300 mg by mouth daily.   FARXIGA 10 MG TABS tablet Take 10 mg by mouth daily.   FLUoxetine (PROZAC) 20 MG capsule Take 20 mg by mouth daily.   furosemide (LASIX) 40 MG tablet Take 1 tablet (40 mg total) by mouth daily.   gabapentin (NEURONTIN) 300 MG capsule Take 300 mg by mouth  3 (three) times daily.   hydrOXYzine (ATARAX) 25 MG tablet Take by mouth daily at 6 (six) AM.   linagliptin (TRADJENTA) 5 MG TABS tablet Take 5 mg by mouth daily.   losartan (COZAAR) 25 MG tablet Take 1 tablet (25 mg total) by mouth daily.   lubiprostone (AMITIZA) 24 MCG capsule Take 24 mcg by mouth 2 (two) times daily with a meal.   magnesium oxide (MAG-OX) 400 MG tablet Take by mouth 2 (two) times daily.   metoprolol tartrate (LOPRESSOR) 25 MG tablet Take 0.5 tablets (12.5 mg total) by mouth 2 (two) times daily.   naloxone (NARCAN) nasal spray 4 mg/0.1 mL Place 1 spray into the nose  as needed (accidental overdose).   neomycin-polymyxin b-dexamethasone (MAXITROL) 3.5-10000-0.1 SUSP Place 1 drop into both eyes as needed (itching).   omeprazole (PRILOSEC) 10 MG capsule Take 10 mg by mouth daily.   oxyCODONE (ROXICODONE) 15 MG immediate release tablet Take 15 mg by mouth every 4 (four) hours as needed for pain.   potassium chloride SA (KLOR-CON M) 20 MEQ tablet Take one tablet by mouth twice a day for one day, then take one tablet by mouth once daily thereafter.   PREMARIN 0.45 MG tablet Take 0.45 mg by mouth daily.   traZODone (DESYREL) 100 MG tablet Take 100 mg by mouth at bedtime. Pt takes 2 tablets 200 mg at bedtime.   TRINTELLIX 10 MG TABS tablet Take 10 mg by mouth daily.   warfarin (COUMADIN) 5 MG tablet TAKE 1 TABLET BY MOUTH DAILY EXCEPT 1.5 TABLETS BY MOUTH ON SUNDAYS AS DIRECTED BY COUMADIN CLINIC   [DISCONTINUED] amLODipine (NORVASC) 10 MG tablet Take 10 mg by mouth daily.   [DISCONTINUED] amLODipine (NORVASC) 5 MG tablet Take 1 tablet (5 mg total) by mouth daily. (Patient taking differently: Take 10 mg by mouth daily.)   [DISCONTINUED] furosemide (LASIX) 40 MG tablet Take one tablet by mouth daily as needed for swelling     Allergies:   Norco [hydrocodone-acetaminophen]   Social History   Socioeconomic History   Marital status: Single    Spouse name: Not on file   Number of  children: 2   Years of education: Not on file   Highest education level: Not on file  Occupational History   Occupation: CNA    Comment: worked for 15 years   Occupation: disability  Tobacco Use   Smoking status: Every Day    Packs/day: 0.25    Years: 35.00    Additional pack years: 0.00    Total pack years: 8.75    Types: Cigarettes   Smokeless tobacco: Never   Tobacco comments:    4 cigarettes a day  Vaping Use   Vaping Use: Some days  Substance and Sexual Activity   Alcohol use: No   Drug use: No   Sexual activity: Not Currently    Comment: Celebate since 2000  Other Topics Concern   Not on file  Social History Narrative   Born and raised by mom until mom died when she was 14yo. After that she stayed with her aunt. Dad was married to someone else and was never around.  Pt has 1 brother and 1 sister and pt is the middle child. Pt had a very rough childhood. Never married. 2 kids one son died several  yrs ago and other son has Schizophrenia. Pt has graduated HS and has some college. Pt is on disability and is unemployed. 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       Financial assistance approved for 100% discount at [facility] and has [facility] card   [employer]  September 29, 2010 2:27 PM   Social Determinants of Health   Financial Resource Strain: Not on file  Food Insecurity: Not on file  Transportation Needs: Not on file  Physical Activity: Not on file  Stress: Not on file  Social Connections: Not on file     Family History: The patient's family history includes Alcohol abuse in her brother, father, and sister; Arthritis in an other family member; Diabetes in an other family member; Drug abuse in her brother and sister; Heart attack (age of onset: 49) in her son;  Hypertension in an other family member; Schizophrenia in her son; Uterine cancer in her mother.  ROS:   Please see the history of present illness.     All other systems reviewed and are negative.  EKGs/Labs/Other  Studies Reviewed:    The following studies were reviewed today:  EKG:   3/32/24: SR with PACs, Prlong QT and Lov Voltage pre-cordial leads 08/24/22:SR diffuse TWI  Cardiac Studies & Procedures   CARDIAC CATHETERIZATION  CARDIAC CATHETERIZATION 05/10/2022  Narrative   Successful pericardiocentesis with removal of 450 cc of serosanguineous fluid.  Fluid was not overtly bloody.  I suspect the fluid was more related to postsurgical pericarditis.  Cannot start NSAIDs at this point given her acute renal failure.  Hopefully, her renal function will improve now that her hemodynamics are better.  As her creatinine improves, consider increasing treatment for pericarditis.  We will try to dose colchicine for her renal function.  We will leave the drain in overnight.  Effusion was essentially gone by echocardiogram postprocedure, after the fluid removal noted above.  Plan for limited echo tomorrow.  Remove drain as determined by fluid accumulation.  Discussed with Dr. Roxan Hockey.  Results conveyed to bother, Jeneen Rinks, who is listed as the emergency contact.  He lives out of town but was going to inform the sister who lives here locally.   CARDIAC CATHETERIZATION  CARDIAC CATHETERIZATION 02/12/2022  Narrative CONCLUSIONS: Mild pulmonary hypertension with mean PA pressure 31 mmHg.  Mean pulmonary wedge pressure 23 mmHg with a V wave 41 mmHg.  WHO group 2 pulmonary hypertension. Pulmonary vascular resistance 0.99 Woods units Left ventricular end diastolic pressure 35 mmHg, consistent with diastolic heart failure. Right dominant coronary anatomy.  Widely patent coronaries without obstructive disease. Previously documented severe mitral regurgitation.  RECOMMENDATIONS:  Per Dr. Glenford Bayley who is making plans for referral to CV surgeon. Home later today if no complications. Resume all home medications including p.m. doses of antihypertensive agents.  She did not take any of her medicine this  morning and had elevated blood pressure requiring IV hydralazine and labetalol.  Findings Coronary Findings Diagnostic  Dominance: Right  No diagnostic findings have been documented. Intervention  No interventions have been documented.     ECHOCARDIOGRAM  ECHOCARDIOGRAM COMPLETE 08/13/2022  Narrative ECHOCARDIOGRAM REPORT    Patient Name:   CHIRSTINA METTEN Date of Exam: 08/13/2022 Medical Rec #:  [id number]         Height:       71.0 in Accession #:    [id number]        Weight:       203.9 lb Date of Birth:  1961-03-09         BSA:          2.126 m Patient Age:    79 years          BP:           93/57 mmHg Patient Gender: F                 HR:           71 bpm. Exam Location:  Inpatient  Procedure: 2D Echo, Cardiac Doppler and Color Doppler  Indications:    Sinus bradycardia  History:        Patient has prior history of Echocardiogram examinations, most recent 07/28/2022. CHF, Mitral Valve Disease, Arrythmias:Atrial Fibrillation; Risk Factors:Hypertension and Diabetes.  Mitral Valve: prosthetic annuloplasty ring valve is present in the mitral position. Procedure Date: 04/19/22.  Sonographer:    Eartha Inch Referring Phys: [id number] [place]   1. S/P MV repair with mild MS (1.74 cm2; mean gradient 7 mmHg; HR 70); mild MR. 2. Left ventricular ejection fraction, by estimation, is 60 to 65%. The left ventricle has normal function. The left ventricle has no regional wall motion abnormalities. There is moderate concentric left ventricular hypertrophy. Left ventricular diastolic parameters are indeterminate. Elevated left atrial pressure. There is the interventricular septum is flattened in systole and diastole, consistent with right ventricular pressure and volume overload. 3. Right ventricular systolic function is normal. The right ventricular size is normal. There is mildly elevated pulmonary artery systolic pressure. 4. Left atrial size was  moderately dilated. 5. Right atrial size was mildly dilated. 6. The mitral valve has been repaired/replaced. Mild mitral valve regurgitation. Mild mitral stenosis. 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 Procedure Date: 04/19/22. 7. Tricuspid valve regurgitation is mild to moderate. 8. The aortic valve is tricuspid. Aortic valve regurgitation is not visualized. No aortic stenosis is present. 9. The inferior vena cava is normal in size with greater than 50% respiratory variability, suggesting right atrial pressure of 3 mmHg.  FINDINGS Left Ventricle: Left ventricular ejection fraction, by estimation, is 60 to 65%. The left ventricle has normal function. The left ventricle has no regional wall motion abnormalities. The left ventricular internal cavity size was normal in size. There is moderate concentric left ventricular hypertrophy. The interventricular septum is flattened in systole and diastole, consistent with right ventricular pressure and volume overload. Left ventricular diastolic parameters are indeterminate. Elevated left atrial pressure.  Right Ventricle: The right ventricular size is normal. Right ventricular systolic function is normal. There is mildly elevated pulmonary artery systolic pressure. The tricuspid regurgitant velocity is 3.18 m/s, and with an assumed right atrial pressure of 3 mmHg, the estimated right ventricular systolic pressure is [id number] mmHg.  Left Atrium: Left atrial size was moderately dilated.  Right Atrium: Right atrial size was mildly dilated.  Pericardium: Trivial pericardial effusion is present.  Mitral Valve: The mitral valve has been repaired/replaced. Mild mitral valve regurgitation. 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 Procedure Date: 04/19/22. Mild mitral valve stenosis. MV peak gradient, 14.6 mmHg. The mean mitral valve gradient is 7.0 mmHg.  Tricuspid Valve: The tricuspid valve is normal  in structure. Tricuspid valve regurgitation is mild to moderate. No evidence of tricuspid stenosis.  Aortic Valve: The aortic valve is tricuspid. Aortic valve regurgitation is not visualized. No aortic stenosis is present.  Pulmonic Valve: The pulmonic valve was normal in structure. Pulmonic valve regurgitation is not visualized. No evidence of pulmonic stenosis.  Aorta: The aortic root is normal in size and structure.  Venous: The inferior vena cava is normal in size with greater than 50% respiratory variability, suggesting right atrial pressure of 3 mmHg.  IAS/Shunts: No atrial level shunt detected by color flow Doppler.  Additional Comments: S/P MV repair with mild MS (1.74 cm2; mean gradient 7 mmHg; HR 70); mild MR.   LEFT VENTRICLE PLAX 2D LVIDd:         4.50 cm   Diastology LVIDs:         3.20 cm   LV e' medial:    4.19 cm/s LV PW:         1.30 cm   LV E/e' medial:  48.0 LV IVS:        1.30 cm   LV e' lateral:  6.32 cm/s LVOT diam:     2.00 cm   LV E/e' lateral: 31.8 LV SV:         74 LV SV Index:   35 LVOT Area:     3.14 cm   RIGHT VENTRICLE             IVC RV S prime:     10.00 cm/s  IVC diam: 1.60 cm TAPSE (M-mode): 1.0 cm  LEFT ATRIUM             Index        RIGHT ATRIUM           Index LA diam:        4.50 cm 2.12 cm/m   RA Area:     13.80 cm LA Vol (A2C):   61.0 ml 28.69 ml/m  RA Volume:   29.50 ml  13.88 ml/m LA Vol (A4C):   45.2 ml 21.26 ml/m LA Biplane Vol: 54.7 ml 25.73 ml/m AORTIC VALVE LVOT Vmax:   122.00 cm/s LVOT Vmean:  86.400 cm/s LVOT VTI:    0.234 m  AORTA Ao Root diam: 3.50 cm  MITRAL VALVE                TRICUSPID VALVE MV Area (PHT): 1.74 cm     TR Peak grad:   40.4 mmHg MV Area VTI:   1.20 cm     TR Mean grad:   32.0 mmHg MV Peak grad:  14.6 mmHg    TR Vmax:        318.00 cm/s MV Mean grad:  7.0 mmHg     TR Vmean:       274.0 cm/s MV Vmax:       1.91 m/s MV Vmean:      123.0 cm/s   SHUNTS MV Decel Time: 435 msec     Systemic  VTI:  0.23 m MR Peak grad: 75.0 mmHg     Systemic Diam: 2.00 cm MR Vmax:      433.00 cm/s MV E velocity: 201.00 cm/s MV A velocity: 121.00 cm/s MV E/A ratio:  1.66  Kirk Ruths MD Electronically signed by Kirk Ruths MD Signature Date/Time: 08/13/2022/12:32:14 PM    Final   TEE  ECHO TEE 07/28/2022  Narrative TRANSESOPHOGEAL ECHO REPORT    Patient Name:   ASHELI MATYAS Date of Exam: 07/28/2022 Medical Rec #:  [id number]         Height:       71.0 in Accession #:    [id number]        Weight:       205.0 lb Date of Birth:  [birth year]         BSA:          2.131 m Patient Age:    71 years          BP:           114/72 mmHg Patient Gender: F                 HR:           83 bpm. Exam Location:  Inpatient  Procedure: Transesophageal Echo, 3D Echo, Color Doppler and Cardiac Doppler  Indications:     elevated mitral valve gradients  History:         Patient has prior history of Echocardiogram examinations, most recent 07/27/2022. Risk Factors:Diabetes, Hypertension, GERD and Current Smoker.  Mitral Valve: prosthetic annuloplasty ring valve is present in  the mitral position. Procedure Date: 04/19/22.  Sonographer:     Bernadene Person RDCS Referring Phys:  [id number] [facility] A Gasper Sells Diagnosing Phys: Sanda Klein MD  PROCEDURE: After discussion of the risks and benefits of a TEE, an informed consent was obtained from the patient. The transesophogeal probe was passed without difficulty through the esophogus of the patient. Sedation performed by different physician. The patient was monitored while under deep sedation. Anesthestetic sedation was provided intravenously by Anesthesiology: 137.65mg  of Propofol, 80mg  of Lidocaine. The patient's vital signs; including heart rate, blood pressure, and oxygen saturation; remained stable throughout the procedure. The patient developed no complications during the procedure.  IMPRESSIONS   1. Left ventricular ejection fraction,  by estimation, is 60 to 65%. The left ventricle has normal function. The left ventricle has no regional wall motion abnormalities. There is mild concentric left ventricular hypertrophy. Left ventricular diastolic function could not be evaluated. 2. Right ventricular systolic function is normal. The right ventricular size is normal. There is moderately elevated pulmonary artery systolic pressure. The estimated right ventricular systolic pressure is Q000111Q mmHg. 3. Left atrial size was severely dilated. No left atrial/left atrial appendage thrombus was detected. The LAA emptying velocity was 70 cm/s. 4. Right atrial size was mildly dilated. 5. The mitral valve has been repaired/replaced. Mild mitral valve regurgitation. Mild mitral stenosis. The mean mitral valve gradient is 6.6 mmHg with average heart rate of 83 bpm. 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 Procedure Date: 04/19/22. 6. The tricuspid valve is has been repaired/replaced. 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 Tricuspid valve regurgitation is moderate. 7. The aortic valve is tricuspid. Aortic valve regurgitation is not visualized. No aortic stenosis is present.  Comparison(s): A "double envelope" or "ghosting" artifact is seen during CW interrogation of the mitral inflow. This may have led to overestimation of the transmitral gradient on the previous study.  FINDINGS Left Ventricle: Left ventricular ejection fraction, by estimation, is 60 to 65%. The left ventricle has normal function. The left ventricle has no regional wall motion abnormalities. The left ventricular internal cavity size was normal in size. There is mild concentric left ventricular hypertrophy. Left ventricular diastolic function could not be evaluated due to mitral valve repair. Left ventricular diastolic function could not be evaluated.  Right Ventricle: The right ventricular size is normal. No increase in right  ventricular wall thickness. Right ventricular systolic function is normal. There is moderately elevated pulmonary artery systolic pressure. The tricuspid regurgitant velocity is 3.52 m/s, and with an assumed right atrial pressure of 5 mmHg, the estimated right ventricular systolic pressure is Q000111Q mmHg.  Left Atrium: Left atrial size was severely dilated. No left atrial/left atrial appendage thrombus was detected. The LAA emptying velocity was 70 cm/s.  Right Atrium: Right atrial size was mildly dilated.  Pericardium: There is no evidence of pericardial effusion.  Mitral Valve: The mitral valve has been repaired/replaced. Mild mitral valve regurgitation, with centrally-directed jet. 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 Procedure Date: 04/19/22. Mild mitral valve stenosis. The mean mitral valve gradient is 6.6 mmHg with average heart rate of 83 bpm.  Tricuspid Valve: The tricuspid valve is has been repaired/replaced. Tricuspid valve regurgitation is moderate. 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  Aortic Valve: The aortic valve is tricuspid. Aortic valve regurgitation is not visualized. No aortic stenosis is present.  Pulmonic Valve: The pulmonic valve was normal in  structure. Pulmonic valve regurgitation is not visualized.  Aorta: The aortic root, ascending aorta, aortic arch and descending aorta are all structurally normal, with no evidence of dilitation or obstruction. There is minimal (Grade I) plaque.  IAS/Shunts: No atrial level shunt detected by color flow Doppler.   MITRAL VALVE             TRICUSPID VALVE MV Area (PHT): 2.48 cm  TV Area (PHT):  2.26 cm MV Mean grad:  6.6 mmHg  TV Mean grad:   1.3 mmHg MV Decel Time: 306 msec  TV PHT:         97 msec TR Peak grad:   49.6 mmHg TR Vmax:        352.00 cm/s  Mihai Croitoru MD Electronically signed by Sanda Klein MD Signature Date/Time: 07/28/2022/2:32:07  PM    Final   MONITORS  LONG TERM MONITOR-LIVE TELEMETRY (3-14 DAYS) 09/18/2022  Narrative   Patient had a minimum heart rate of 52 bpm, maximum heart rate of 176 bpm, and average heart rate of 75 bpm.   Predominant underlying rhythm was sinus rhythm.   Paroxysmal atrial fibrillation, longest episode 4 minutes, fastest 130 bpm.   One run of NSVT  lasting 4 beats at longest.   Isolated PACs were occasional (1.5%).   Isolated PVCs were occasional (1.9%).   Triggered and diary events associated with sinus rhythm.  No cardiac etiology of syncope.              Recent Labs: 08/12/2022: ALT 11; B Natriuretic Peptide 497.2 12/01/2022: NT-Pro BNP 2,487; TSH 1.780 12/20/2022: BUN 9; Creatinine, Ser 1.10; Hemoglobin 9.5; Magnesium 1.7; Platelets 256; Potassium 3.6; Sodium 140  Recent Lipid Panel    Component Value Date/Time   CHOL 180 03/25/2016 0909   TRIG 122 03/25/2016 0909   HDL 39 (L) 03/25/2016 0909   CHOLHDL 4.6 03/25/2016 0909   VLDL 24 03/25/2016 0909   LDLCALC 117 03/25/2016 0909     Risk Assessment/Calculations:    CHA2DS2-VASc Score = 4   This indicates a 4.8% annual risk of stroke. The patient's score is based upon: CHF History: 1 HTN History: 1 Diabetes History: 1 Stroke History: 0 Vascular Disease History: 0 Age Score: 0 Gender Score: 1             Physical Exam:    VS:  BP 112/80   Pulse 79   Ht 5\' 11"  (1.803 m)   Wt 176 lb (79.8 kg)   LMP 12/06/1976   SpO2 98%   BMI 24.55 kg/m     Wt Readings from Last 3 Encounters:  02/28/23 176 lb (79.8 kg)  12/20/22 200 lb (90.7 kg)  12/01/22 207 lb (93.9 kg)    GEN:  Well nourished, well developed in no acute distress HEENT: Normal NECK: No JVD CARDIAC: RRR, no rubs, gallops systolic and diastolic murmur RESPIRATORY:  Clear to auscultation without rales, wheezing or rhonchi  ABDOMEN: Soft, non-tender, non-distended MUSCULOSKELETAL:  No edema; No deformity  SKIN: Warm and dry NEUROLOGIC:  Alert  and oriented x 3 PSYCHIATRIC:  Normal affect   ASSESSMENT:    1. S/P MVR (mitral valve repair)   2. Mixed hyperlipidemia   3. Hypertension associated with diabetes ([place])   4. PAF (paroxysmal atrial fibrillation) ([place])   5. Tobacco use     PLAN:     Mitral stenosis Rheumatic heart disease S/ MVR and TVR; would need ABX prior to dental care - NYHA I, Stage C -  this is the best she has been since establishing care - lasix 40 gm PO daily with 20 meq K; continue diltiazem; if low BP again stop her metoprolol (12.5 mg PO BID only) -continue her coumadin - Stop her amlodipine; she is on diltiazem, if BP increases we will increase her diltiazem dose  Labile BP - improved; continue ARB  Tobacco abuse and COPD - discussed cessation, she is down to three cigarettes  PAF and GI bleed (resolved, presently in SR with PACs) - warfarin  HLD - continue current statin  Will get BMP on diuretic, LDL and ALT on statin Will get echo for rheumatic valve disease in the fall and see her after       Medication Adjustments/Labs and Tests Ordered: Current medicines are reviewed at length with the patient today.  Concerns regarding medicines are outlined above.  Orders Placed This Encounter  Procedures   ALT   Basic metabolic panel   Lipid panel   EKG 12-Lead   ECHOCARDIOGRAM COMPLETE   Meds ordered this encounter  Medications   furosemide (LASIX) 40 MG tablet    Sig: Take 1 tablet (40 mg total) by mouth daily.    Dispense:  90 tablet    Refill:  3    Patient Instructions  Medication Instructions:  Your physician has recommended you make the following change in your medication:   STOP: amlodipine (Norvasc) please monitor your BP and notify our office if BP is consistently above 135/85  *If you need a refill on your cardiac medications before your next appointment, please call your pharmacy*   Lab Work: TODAY: FLP, ALT and BMP  If you have labs (blood work) drawn today and  your tests are completely normal, you will receive your results only by: [place] (if you have MyChart) OR A paper copy in the mail If you have any lab test that is abnormal or we need to change your treatment, we will call you to review the results.   Testing/Procedures: AUG-SEPT 2024: Your physician has requested that you have an echocardiogram. Echocardiography is a painless test that uses sound waves to create images of your heart. It provides your doctor with information about the size and shape of your heart and how well your heart's chambers and valves are working. This procedure takes approximately one hour. There are no restrictions for this procedure. Please do NOT wear cologne, perfume, aftershave, or lotions (deodorant is allowed). Please arrive 15 minutes prior to your appointment time.    Follow-Up: At [facility], you and your health needs are our priority.  As part of our continuing mission to provide you with exceptional heart care, we have created designated Provider Care Teams.  These Care Teams include your primary Cardiologist (physician) and Advanced Practice Providers (APPs -  Physician Assistants and Nurse Practitioners) who all work together to provide you with the care you need, when you need it.  We recommend signing up for the patient portal called "MyChart".  Sign up information is provided on this After Visit Summary.  MyChart is used to connect with patients for Virtual Visits (Telemedicine).  Patients are able to view lab/test results, encounter notes, upcoming appointments, etc.  Non-urgent messages can be sent to your provider as well.   To learn more about what you can do with MyChart, go to [email or web address].    Your next appointment:   6 month(s)  Provider:   Werner Lean, MD  Signed, Werner Lean, MD  02/28/2023 9:35 AM    [place]

## 2023-02-28 ENCOUNTER — Ambulatory Visit: Payer: 59 | Attending: Internal Medicine | Admitting: Internal Medicine

## 2023-02-28 ENCOUNTER — Encounter: Payer: Self-pay | Admitting: Internal Medicine

## 2023-02-28 VITALS — BP 112/80 | HR 79 | Ht 71.0 in | Wt 176.0 lb

## 2023-02-28 DIAGNOSIS — E1159 Type 2 diabetes mellitus with other circulatory complications: Secondary | ICD-10-CM

## 2023-02-28 DIAGNOSIS — E782 Mixed hyperlipidemia: Secondary | ICD-10-CM

## 2023-02-28 DIAGNOSIS — I480 Paroxysmal atrial fibrillation: Secondary | ICD-10-CM

## 2023-02-28 DIAGNOSIS — Z720 Tobacco use: Secondary | ICD-10-CM

## 2023-02-28 DIAGNOSIS — Z9889 Other specified postprocedural states: Secondary | ICD-10-CM | POA: Diagnosis not present

## 2023-02-28 DIAGNOSIS — I091 Rheumatic diseases of endocardium, valve unspecified: Secondary | ICD-10-CM

## 2023-02-28 DIAGNOSIS — I152 Hypertension secondary to endocrine disorders: Secondary | ICD-10-CM

## 2023-02-28 MED ORDER — FUROSEMIDE 40 MG PO TABS: 40.0000 mg | ORAL_TABLET | Freq: Every day | ORAL | 3 refills | Status: DC

## 2023-02-28 NOTE — Patient Instructions (Signed)
Medication Instructions:  Your physician has recommended you make the following change in your medication:   STOP: amlodipine (Norvasc) please monitor your BP and notify our office if BP is consistently above 135/85  *If you need a refill on your cardiac medications before your next appointment, please call your pharmacy*   Lab Work: TODAY: FLP, ALT and BMP  If you have labs (blood work) drawn today and your tests are completely normal, you will receive your results only by: [place] (if you have MyChart) OR A paper copy in the mail If you have any lab test that is abnormal or we need to change your treatment, we will call you to review the results.   Testing/Procedures: AUG-SEPT 2024: Your physician has requested that you have an echocardiogram. Echocardiography is a painless test that uses sound waves to create images of your heart. It provides your doctor with information about the size and shape of your heart and how well your heart's chambers and valves are working. This procedure takes approximately one hour. There are no restrictions for this procedure. Please do NOT wear cologne, perfume, aftershave, or lotions (deodorant is allowed). Please arrive 15 minutes prior to your appointment time.    Follow-Up: At [facility], you and your health needs are our priority.  As part of our continuing mission to provide you with exceptional heart care, we have created designated Provider Care Teams.  These Care Teams include your primary Cardiologist (physician) and Advanced Practice Providers (APPs -  Physician Assistants and Nurse Practitioners) who all work together to provide you with the care you need, when you need it.  We recommend signing up for the patient portal called "MyChart".  Sign up information is provided on this After Visit Summary.  MyChart is used to connect with patients for Virtual Visits (Telemedicine).  Patients are able to view lab/test results,  encounter notes, upcoming appointments, etc.  Non-urgent messages can be sent to your provider as well.   To learn more about what you can do with MyChart, go to [email or web address].    Your next appointment:   6 month(s)  Provider:   Werner Lean, MD

## 2023-03-01 LAB — BASIC METABOLIC PANEL
BUN/Creatinine Ratio: 18 (ref 12–28)
BUN: 29 mg/dL — ABNORMAL HIGH (ref 8–27)
CO2: 19 mmol/L — ABNORMAL LOW (ref 20–29)
Calcium: 8.7 mg/dL (ref 8.7–10.3)
Chloride: 101 mmol/L (ref 96–106)
Creatinine, Ser: 1.57 mg/dL — ABNORMAL HIGH (ref 0.57–1.00)
Glucose: 170 mg/dL — ABNORMAL HIGH (ref 70–99)
Potassium: 4.3 mmol/L (ref 3.5–5.2)
Sodium: 141 mmol/L (ref 134–144)
eGFR: 37 mL/min/{1.73_m2} — ABNORMAL LOW (ref >59)

## 2023-03-01 LAB — LIPID PANEL
Chol/HDL Ratio: 2.8 ratio (ref 0.0–4.4)
Cholesterol, Total: 153 mg/dL (ref 100–199)
HDL: 55 mg/dL (ref >39)
LDL Chol Calc (NIH): 81 mg/dL (ref 0–99)
Triglycerides: 94 mg/dL (ref 0–149)
VLDL Cholesterol Cal: 17 mg/dL (ref 5–40)

## 2023-03-01 LAB — ALT: ALT: 14 IU/L (ref 0–32)

## 2023-03-08 ENCOUNTER — Ambulatory Visit: Payer: 59 | Attending: Cardiology

## 2023-03-08 ENCOUNTER — Telehealth: Payer: Self-pay | Admitting: Internal Medicine

## 2023-03-08 DIAGNOSIS — Z5181 Encounter for therapeutic drug level monitoring: Secondary | ICD-10-CM | POA: Diagnosis not present

## 2023-03-08 DIAGNOSIS — I480 Paroxysmal atrial fibrillation: Secondary | ICD-10-CM | POA: Diagnosis not present

## 2023-03-08 DIAGNOSIS — Z9889 Other specified postprocedural states: Secondary | ICD-10-CM | POA: Diagnosis not present

## 2023-03-08 LAB — POCT INR: INR: 2.6 (ref 2.0–3.0)

## 2023-03-08 NOTE — Telephone Encounter (Signed)
Patient was returning call. Please advise ?

## 2023-03-08 NOTE — Telephone Encounter (Signed)
The patient has been notified of the result and verbalized understanding.  All questions (if any) were answered. Precious Gilding, RN 03/08/2023 3:35 PM  (Lab results)

## 2023-03-08 NOTE — Patient Instructions (Signed)
Description   Continue on same dosage 1 tablet daily of Warfarin daily except 1/2 on Tuesdays. Recheck INR in 4 weeks. Anticoagulation Clinic [phone]

## 2023-04-03 ENCOUNTER — Other Ambulatory Visit: Payer: Self-pay

## 2023-04-03 ENCOUNTER — Emergency Department (HOSPITAL_COMMUNITY): Payer: 59

## 2023-04-03 ENCOUNTER — Inpatient Hospital Stay (HOSPITAL_COMMUNITY)
Admission: EM | Admit: 2023-04-03 | Discharge: 2023-04-09 | DRG: 436 | Disposition: A | Discharge status: Home / Self Care | Payer: 59 | Attending: Internal Medicine | Admitting: Internal Medicine

## 2023-04-03 ENCOUNTER — Encounter (HOSPITAL_COMMUNITY): Payer: Self-pay

## 2023-04-03 DIAGNOSIS — N3001 Acute cystitis with hematuria: Secondary | ICD-10-CM | POA: Diagnosis present

## 2023-04-03 DIAGNOSIS — Z7901 Long term (current) use of anticoagulants: Secondary | ICD-10-CM

## 2023-04-03 DIAGNOSIS — Z7984 Long term (current) use of oral hypoglycemic drugs: Secondary | ICD-10-CM

## 2023-04-03 DIAGNOSIS — N1831 Chronic kidney disease, stage 3a: Secondary | ICD-10-CM

## 2023-04-03 DIAGNOSIS — F419 Anxiety disorder, unspecified: Secondary | ICD-10-CM | POA: Diagnosis present

## 2023-04-03 DIAGNOSIS — E1165 Type 2 diabetes mellitus with hyperglycemia: Secondary | ICD-10-CM | POA: Diagnosis present

## 2023-04-03 DIAGNOSIS — B9620 Unspecified Escherichia coli [E. coli] as the cause of diseases classified elsewhere: Secondary | ICD-10-CM | POA: Diagnosis present

## 2023-04-03 DIAGNOSIS — I480 Paroxysmal atrial fibrillation: Secondary | ICD-10-CM | POA: Diagnosis present

## 2023-04-03 DIAGNOSIS — Z818 Family history of other mental and behavioral disorders: Secondary | ICD-10-CM

## 2023-04-03 DIAGNOSIS — K3189 Other diseases of stomach and duodenum: Secondary | ICD-10-CM | POA: Diagnosis present

## 2023-04-03 DIAGNOSIS — I700 Atherosclerosis of aorta: Secondary | ICD-10-CM | POA: Diagnosis present

## 2023-04-03 DIAGNOSIS — Z90710 Acquired absence of both cervix and uterus: Secondary | ICD-10-CM

## 2023-04-03 DIAGNOSIS — I091 Rheumatic diseases of endocardium, valve unspecified: Secondary | ICD-10-CM | POA: Diagnosis present

## 2023-04-03 DIAGNOSIS — K869 Disease of pancreas, unspecified: Secondary | ICD-10-CM

## 2023-04-03 DIAGNOSIS — C252 Malignant neoplasm of tail of pancreas: Secondary | ICD-10-CM | POA: Diagnosis not present

## 2023-04-03 DIAGNOSIS — E1122 Type 2 diabetes mellitus with diabetic chronic kidney disease: Secondary | ICD-10-CM | POA: Diagnosis present

## 2023-04-03 DIAGNOSIS — N3000 Acute cystitis without hematuria: Secondary | ICD-10-CM | POA: Diagnosis present

## 2023-04-03 DIAGNOSIS — R634 Abnormal weight loss: Secondary | ICD-10-CM | POA: Diagnosis present

## 2023-04-03 DIAGNOSIS — E1169 Type 2 diabetes mellitus with other specified complication: Secondary | ICD-10-CM | POA: Diagnosis present

## 2023-04-03 DIAGNOSIS — Z952 Presence of prosthetic heart valve: Secondary | ICD-10-CM

## 2023-04-03 DIAGNOSIS — Z6824 Body mass index (BMI) 24.0-24.9, adult: Secondary | ICD-10-CM

## 2023-04-03 DIAGNOSIS — J449 Chronic obstructive pulmonary disease, unspecified: Secondary | ICD-10-CM | POA: Insufficient documentation

## 2023-04-03 DIAGNOSIS — Z79899 Other long term (current) drug therapy: Secondary | ICD-10-CM

## 2023-04-03 DIAGNOSIS — D696 Thrombocytopenia, unspecified: Secondary | ICD-10-CM | POA: Diagnosis present

## 2023-04-03 DIAGNOSIS — R791 Abnormal coagulation profile: Secondary | ICD-10-CM | POA: Diagnosis present

## 2023-04-03 DIAGNOSIS — K449 Diaphragmatic hernia without obstruction or gangrene: Secondary | ICD-10-CM | POA: Diagnosis present

## 2023-04-03 DIAGNOSIS — K219 Gastro-esophageal reflux disease without esophagitis: Secondary | ICD-10-CM | POA: Diagnosis present

## 2023-04-03 DIAGNOSIS — K8689 Other specified diseases of pancreas: Secondary | ICD-10-CM | POA: Diagnosis present

## 2023-04-03 DIAGNOSIS — Z8679 Personal history of other diseases of the circulatory system: Secondary | ICD-10-CM

## 2023-04-03 DIAGNOSIS — E11649 Type 2 diabetes mellitus with hypoglycemia without coma: Secondary | ICD-10-CM | POA: Diagnosis not present

## 2023-04-03 DIAGNOSIS — I152 Hypertension secondary to endocrine disorders: Secondary | ICD-10-CM | POA: Diagnosis present

## 2023-04-03 DIAGNOSIS — Z833 Family history of diabetes mellitus: Secondary | ICD-10-CM

## 2023-04-03 DIAGNOSIS — I5032 Chronic diastolic (congestive) heart failure: Secondary | ICD-10-CM | POA: Diagnosis present

## 2023-04-03 DIAGNOSIS — R109 Unspecified abdominal pain: Secondary | ICD-10-CM | POA: Diagnosis not present

## 2023-04-03 DIAGNOSIS — Z8719 Personal history of other diseases of the digestive system: Secondary | ICD-10-CM

## 2023-04-03 DIAGNOSIS — E871 Hypo-osmolality and hyponatremia: Secondary | ICD-10-CM | POA: Diagnosis present

## 2023-04-03 DIAGNOSIS — N390 Urinary tract infection, site not specified: Principal | ICD-10-CM

## 2023-04-03 DIAGNOSIS — G894 Chronic pain syndrome: Secondary | ICD-10-CM | POA: Diagnosis present

## 2023-04-03 DIAGNOSIS — F17210 Nicotine dependence, cigarettes, uncomplicated: Secondary | ICD-10-CM | POA: Diagnosis present

## 2023-04-03 DIAGNOSIS — E1159 Type 2 diabetes mellitus with other circulatory complications: Secondary | ICD-10-CM | POA: Diagnosis present

## 2023-04-03 DIAGNOSIS — Z8049 Family history of malignant neoplasm of other genital organs: Secondary | ICD-10-CM

## 2023-04-03 DIAGNOSIS — K2289 Other specified disease of esophagus: Secondary | ICD-10-CM | POA: Diagnosis present

## 2023-04-03 DIAGNOSIS — E7800 Pure hypercholesterolemia, unspecified: Secondary | ICD-10-CM | POA: Diagnosis present

## 2023-04-03 DIAGNOSIS — E1140 Type 2 diabetes mellitus with diabetic neuropathy, unspecified: Secondary | ICD-10-CM | POA: Diagnosis present

## 2023-04-03 DIAGNOSIS — F32A Depression, unspecified: Secondary | ICD-10-CM | POA: Diagnosis present

## 2023-04-03 DIAGNOSIS — C787 Secondary malignant neoplasm of liver and intrahepatic bile duct: Secondary | ICD-10-CM | POA: Diagnosis present

## 2023-04-03 DIAGNOSIS — E876 Hypokalemia: Secondary | ICD-10-CM | POA: Diagnosis not present

## 2023-04-03 DIAGNOSIS — M797 Fibromyalgia: Secondary | ICD-10-CM | POA: Diagnosis present

## 2023-04-03 DIAGNOSIS — Z8249 Family history of ischemic heart disease and other diseases of the circulatory system: Secondary | ICD-10-CM

## 2023-04-03 DIAGNOSIS — R918 Other nonspecific abnormal finding of lung field: Secondary | ICD-10-CM

## 2023-04-03 LAB — COMPREHENSIVE METABOLIC PANEL
ALT: 19 U/L (ref 0–44)
AST: 15 U/L (ref 15–41)
Albumin: 4.3 g/dL (ref 3.5–5.0)
Alkaline Phosphatase: 68 U/L (ref 38–126)
Anion gap: 13 (ref 5–15)
BUN: 34 mg/dL — ABNORMAL HIGH (ref 8–23)
CO2: 20 mmol/L — ABNORMAL LOW (ref 22–32)
Calcium: 8.7 mg/dL — ABNORMAL LOW (ref 8.9–10.3)
Chloride: 97 mmol/L — ABNORMAL LOW (ref 98–111)
Creatinine, Ser: 1.31 mg/dL — ABNORMAL HIGH (ref 0.44–1.00)
GFR, Estimated: 46 mL/min — ABNORMAL LOW (ref >60)
Glucose, Bld: 305 mg/dL — ABNORMAL HIGH (ref 70–99)
Potassium: 3.9 mmol/L (ref 3.5–5.1)
Sodium: 130 mmol/L — ABNORMAL LOW (ref 135–145)
Total Bilirubin: 0.5 mg/dL (ref 0.3–1.2)
Total Protein: 7.5 g/dL (ref 6.5–8.1)

## 2023-04-03 LAB — CBC WITH DIFFERENTIAL/PLATELET
Abs Immature Granulocytes: 0.03 10*3/uL (ref 0.00–0.07)
Basophils Absolute: 0 10*3/uL (ref 0.0–0.1)
Basophils Relative: 0 %
Eosinophils Absolute: 0.2 10*3/uL (ref 0.0–0.5)
Eosinophils Relative: 2 %
HCT: 45.3 % (ref 36.0–46.0)
Hemoglobin: 15.4 g/dL — ABNORMAL HIGH (ref 12.0–15.0)
Immature Granulocytes: 0 %
Lymphocytes Relative: 23 %
Lymphs Abs: 1.9 10*3/uL (ref 0.7–4.0)
MCH: 29.3 pg (ref 26.0–34.0)
MCHC: 34 g/dL (ref 30.0–36.0)
MCV: 86.3 fL (ref 80.0–100.0)
Monocytes Absolute: 0.6 10*3/uL (ref 0.1–1.0)
Monocytes Relative: 7 %
Neutro Abs: 5.6 10*3/uL (ref 1.7–7.7)
Neutrophils Relative %: 68 %
Platelets: 147 10*3/uL — ABNORMAL LOW (ref 150–400)
RBC: 5.25 MIL/uL — ABNORMAL HIGH (ref 3.87–5.11)
RDW: 15.9 % — ABNORMAL HIGH (ref 11.5–15.5)
WBC: 8.2 10*3/uL (ref 4.0–10.5)
nRBC: 0 % (ref 0.0–0.2)

## 2023-04-03 LAB — URINALYSIS, ROUTINE W REFLEX MICROSCOPIC
Bilirubin Urine: NEGATIVE
Glucose, UA: 150 mg/dL — AB
Ketones, ur: NEGATIVE mg/dL
Nitrite: POSITIVE — AB
Protein, ur: 30 mg/dL — AB
RBC / HPF: >50 RBC/hpf (ref 0–5)
Specific Gravity, Urine: 1.02 (ref 1.005–1.030)
pH: 5 (ref 5.0–8.0)

## 2023-04-03 LAB — LIPASE, BLOOD: Lipase: 63 U/L — ABNORMAL HIGH (ref 11–51)

## 2023-04-03 LAB — PROTIME-INR
INR: 3.6 — ABNORMAL HIGH (ref 0.8–1.2)
Prothrombin Time: 35.4 seconds — ABNORMAL HIGH (ref 11.4–15.2)

## 2023-04-03 MED ORDER — SODIUM CHLORIDE 0.9 % IV SOLN: INTRAVENOUS | Status: AC

## 2023-04-03 MED ORDER — SODIUM CHLORIDE 0.9 % IV SOLN
1.0000 g | INTRAVENOUS | Status: DC
  Administered 2023-04-04 – 2023-04-05 (×2): 1 g via INTRAVENOUS
  Filled 2023-04-03 (×2): qty 10

## 2023-04-03 MED ORDER — MORPHINE SULFATE (PF) 4 MG/ML IV SOLN
4.0000 mg | Freq: Once | INTRAVENOUS | Status: AC
  Administered 2023-04-03: 4 mg via INTRAVENOUS
  Filled 2023-04-03: qty 1

## 2023-04-03 MED ORDER — METOCLOPRAMIDE HCL 5 MG/ML IJ SOLN
5.0000 mg | Freq: Once | INTRAMUSCULAR | Status: AC
  Administered 2023-04-03: 5 mg via INTRAVENOUS
  Filled 2023-04-03: qty 2

## 2023-04-03 MED ORDER — INSULIN ASPART 100 UNIT/ML IJ SOLN
0.0000 [IU] | Freq: Three times a day (TID) | INTRAMUSCULAR | Status: DC
  Administered 2023-04-04: 5 [IU] via SUBCUTANEOUS
  Administered 2023-04-04: 7 [IU] via SUBCUTANEOUS
  Administered 2023-04-04 – 2023-04-05 (×2): 5 [IU] via SUBCUTANEOUS
  Administered 2023-04-05: 7 [IU] via SUBCUTANEOUS
  Administered 2023-04-05: 9 [IU] via SUBCUTANEOUS
  Administered 2023-04-06: 7 [IU] via SUBCUTANEOUS
  Filled 2023-04-03: qty 0.09

## 2023-04-03 MED ORDER — SODIUM CHLORIDE 0.9 % IV SOLN
1.0000 g | Freq: Once | INTRAVENOUS | Status: AC
  Administered 2023-04-03: 1 g via INTRAVENOUS
  Filled 2023-04-03: qty 10

## 2023-04-03 MED ORDER — INSULIN ASPART 100 UNIT/ML IJ SOLN
0.0000 [IU] | Freq: Every day | INTRAMUSCULAR | Status: DC
  Administered 2023-04-04 (×2): 4 [IU] via SUBCUTANEOUS
  Administered 2023-04-05: 5 [IU] via SUBCUTANEOUS
  Administered 2023-04-06: 4 [IU] via SUBCUTANEOUS
  Filled 2023-04-03: qty 0.05

## 2023-04-03 MED ORDER — IOHEXOL 300 MG/ML  SOLN
75.0000 mL | Freq: Once | INTRAMUSCULAR | Status: AC | PRN
  Administered 2023-04-03: 75 mL via INTRAVENOUS

## 2023-04-03 MED ORDER — HYDROMORPHONE HCL 1 MG/ML IJ SOLN
0.5000 mg | INTRAMUSCULAR | Status: DC | PRN
  Administered 2023-04-04 – 2023-04-07 (×3): 0.5 mg via INTRAVENOUS
  Filled 2023-04-03 (×3): qty 0.5

## 2023-04-03 MED ORDER — SODIUM CHLORIDE 0.9 % IV BOLUS
1000.0000 mL | Freq: Once | INTRAVENOUS | Status: AC
  Administered 2023-04-03: 1000 mL via INTRAVENOUS

## 2023-04-03 MED ORDER — SODIUM CHLORIDE 0.9% FLUSH
3.0000 mL | Freq: Two times a day (BID) | INTRAVENOUS | Status: DC
  Administered 2023-04-04 – 2023-04-09 (×12): 3 mL via INTRAVENOUS

## 2023-04-03 MED ORDER — PROCHLORPERAZINE EDISYLATE 10 MG/2ML IJ SOLN: 10.0000 mg | Freq: Four times a day (QID) | INTRAMUSCULAR | Status: DC | PRN

## 2023-04-03 NOTE — ED Provider Notes (Signed)
[place] EMERGENCY DEPARTMENT AT [facility] Provider Note   CSN: [id number] Arrival date & time: 04/03/23  1619     History  Chief Complaint  Patient presents with   Dysuria   HPI [name] is a 62 y.o. female with h/o diabetes, hypertension, status post mitral valve repair, CHF presenting for abdominal pain and dysuria.  Abdominal pain started 2 weeks ago and located in the epigastric region.  It is sharp and nonradiating.  Endorsing is intermittent vomiting that is nonbloody.  Also endorsing nausea.  Denies bowel changes.  Last bowel movement was earlier today.  Also states she has had painful urination and malodorous urine which started about a week ago.  Denies fever and chills.  Also reports losing approximately 120 pounds in the last year.   Dysuria      Home Medications Prior to Admission medications   Medication Sig Start Date End Date Taking? Authorizing Provider  acetaminophen (TYLENOL) 500 MG tablet Take 1,000 mg by mouth every 6 (six) hours as needed for mild pain.    [provider]  albuterol (PROVENTIL HFA;VENTOLIN HFA) 108 (90 Base) MCG/ACT inhaler Inhale 2 puffs into the lungs every 6 (six) hours as needed for wheezing or shortness of breath.     [provider]  ARIPiprazole (ABILIFY) 5 MG tablet Take 2.5 mg by mouth daily. 07/20/22   [provider]  atorvastatin (LIPITOR) 20 MG tablet Take 20 mg by mouth daily.    [provider]  baclofen (LIORESAL) 10 MG tablet Take by mouth 2 (two) times daily. 04/05/20   [provider]  buPROPion (WELLBUTRIN XL) 300 MG 24 hr tablet Take 300 mg by mouth in the morning. 03/24/22   [provider]  calcium carbonate (OSCAL) 1500 (600 Ca) MG TABS tablet Take 1,500 mg by mouth daily with breakfast.    [provider]  cycloSPORINE (RESTASIS) 0.05 % ophthalmic emulsion Apply to eye as needed (dry eyes). 02/09/19   [provider]   dexlansoprazole (DEXILANT) 60 MG capsule Take 1 capsule by mouth daily. 07/26/22   [provider]  diclofenac Sodium (VOLTAREN) 1 % GEL Apply topically as needed (pain). 06/29/17   [provider]  diltiazem (CARDIZEM CD) 300 MG 24 hr capsule Take 300 mg by mouth daily. 08/20/22   [provider]  FARXIGA 10 MG TABS tablet Take 10 mg by mouth daily. 08/10/22   [provider]  FLUoxetine (PROZAC) 20 MG capsule Take 20 mg by mouth daily. 09/20/22   [provider]  furosemide (LASIX) 40 MG tablet Take 1 tablet (40 mg total) by mouth daily. 02/28/23   Chandrasekhar, [place] Dragon A, MD  gabapentin (NEURONTIN) 300 MG capsule Take 300 mg by mouth 3 (three) times daily. 08/19/22   [provider]  hydrOXYzine (ATARAX) 25 MG tablet Take by mouth daily at 6 (six) AM. 01/16/18   [provider]  linagliptin (TRADJENTA) 5 MG TABS tablet Take 5 mg by mouth daily.    [provider]  losartan (COZAAR) 25 MG tablet Take 1 tablet (25 mg total) by mouth daily. 08/18/22 02/28/23  Arrien, York Ram, MD  lubiprostone (AMITIZA) 24 MCG capsule Take 24 mcg by mouth 2 (two) times daily with a meal.    [provider]  magnesium oxide (MAG-OX) 400 MG tablet Take by mouth 2 (two) times daily. 12/19/20   [provider]  metoprolol tartrate (LOPRESSOR) 25 MG tablet Take 0.5 tablets (12.5 mg total)  by mouth 2 (two) times daily. 12/20/22   Sharlene Dory, PA-C  naloxone [facility]) nasal spray 4 mg/0.1 mL Place 1 spray into the nose as needed (accidental overdose).    [provider]  neomycin-polymyxin b-dexamethasone (MAXITROL) 3.5-10000-0.1 SUSP Place 1 drop into both eyes as needed (itching). 08/11/22   [provider]  omeprazole (PRILOSEC) 10 MG capsule Take 10 mg by mouth daily.    [provider]  oxyCODONE (ROXICODONE) 15 MG immediate release tablet Take 15 mg by mouth every 4 (four) hours as needed for pain.     [provider]  potassium chloride SA (KLOR-CON M) 20 MEQ tablet Take one tablet by mouth twice a day for one day, then take one tablet by mouth once daily thereafter. 12/22/22   Sharlene Dory, PA-C  PREMARIN 0.45 MG tablet Take 0.45 mg by mouth daily. 01/11/22   [provider]  traZODone (DESYREL) 100 MG tablet Take 100 mg by mouth at bedtime. Pt takes 2 tablets 200 mg at bedtime.    [provider]  TRINTELLIX 10 MG TABS tablet Take 10 mg by mouth daily. 10/22/22   [provider]  warfarin (COUMADIN) 5 MG tablet TAKE 1 TABLET BY MOUTH DAILY EXCEPT 1.5 TABLETS BY MOUTH ON SUNDAYS AS DIRECTED BY COUMADIN CLINIC 11/03/22   Christell Constant, MD      Allergies    Norco [hydrocodone-acetaminophen]    Review of Systems   Review of Systems  Genitourinary:  Positive for dysuria.    Physical Exam   Vitals:   04/03/23 1900 04/03/23 2140  BP: 131/87 (!) 140/79  Pulse:  70  Resp:  18  Temp:  97.7 F (36.5 C)  SpO2: 98% 97%    CONSTITUTIONAL:  well-appearing, NAD NEURO:  Alert and oriented x 3, CN 3-12 grossly intact EYES:  eyes equal and reactive ENT/NECK:  Supple, no stridor  CARDIO:  regular rate and rhythm, appears well-perfused  PULM:  No respiratory distress, CTAB GI/GU:  non-distended, soft, generalized upper abdominal tenderness MSK/SPINE:  No gross deformities, no edema, moves all extremities  SKIN:  no rash, atraumatic   *Additional and/or pertinent findings included in MDM below    ED Results / Procedures / Treatments   Labs (all labs ordered are listed, but only abnormal results are displayed) Labs Reviewed  URINALYSIS, ROUTINE W REFLEX MICROSCOPIC - Abnormal; Notable for the following components:      Result Value   APPearance HAZY (*)    Glucose, UA 150 (*)    Hgb urine dipstick LARGE (*)    Protein, ur 30 (*)    Nitrite POSITIVE (*)    Leukocytes,Ua TRACE (*)    Bacteria, UA MANY (*)    All other components within  normal limits  CBC WITH DIFFERENTIAL/PLATELET - Abnormal; Notable for the following components:   RBC 5.25 (*)    Hemoglobin 15.4 (*)    RDW 15.9 (*)    Platelets 147 (*)    All other components within normal limits  COMPREHENSIVE METABOLIC PANEL - Abnormal; Notable for the following components:   Sodium 130 (*)    Chloride 97 (*)    CO2 20 (*)    Glucose, Bld 305 (*)    BUN 34 (*)    Creatinine, Ser 1.31 (*)    Calcium 8.7 (*)    GFR, Estimated 46 (*)    All other components within normal limits  LIPASE, BLOOD - Abnormal; Notable for the  following components:   Lipase 63 (*)    All other components within normal limits  PROTIME-INR - Abnormal; Notable for the following components:   Prothrombin Time 35.4 (*)    INR 3.6 (*)    All other components within normal limits  URINE CULTURE  CBC  COMPREHENSIVE METABOLIC PANEL  PROTIME-INR  HEMOGLOBIN A1C  CANCER ANTIGEN 19-9    EKG None  Radiology CT ABDOMEN PELVIS W CONTRAST  Addendum Date: 04/03/2023   ADDENDUM REPORT: 04/03/2023 22:33 ADDENDUM: Critical Value/emergent results were called by telephone at the time of interpretation on 04/03/2023 at 10:33 pm to provider Riki Sheer , who verbally acknowledged these results. Electronically Signed   By: Thornell Sartorius M.D.   On: 04/03/2023 22:33   Result Date: 04/03/2023 CLINICAL DATA:  Abdominal pain, acute, nonlocalized. EXAM: CT ABDOMEN AND PELVIS WITH CONTRAST TECHNIQUE: Multidetector CT imaging of the abdomen and pelvis was performed using the standard protocol following bolus administration of intravenous contrast. RADIATION DOSE REDUCTION: This exam was performed according to the departmental dose-optimization program which includes automated exposure control, adjustment of the mA and/or kV according to patient size and/or use of iterative reconstruction technique. CONTRAST:  75mL OMNIPAQUE IOHEXOL 300 MG/ML  SOLN COMPARISON:  12/18/2020. FINDINGS: Lower chest: A 6 mm nodule is  present in the right lower lobe, axial image 9. A 1 cm nodule is noted in the right lower lobe, axial image 23. Hepatobiliary: A 1.2 cm hypodensity with surrounding enhancement is noted in the right lobe of the liver, segment 5. An ill-defined hypodensity is present in the left lobe of the liver measuring 2.3 cm, hepatic segment 4B. An ill-defined hypodensity is present in the left lobe of the liver measuring 9 mm, hepatic segment 2. No biliary ductal dilatation. The gallbladder is without stones. Pancreas: An ill-defined hypodense mass is present in the distal pancreas measuring 9.9 x 2.9 cm. There is mild pancreatic ductal dilatation at the head of the pancreas measuring 4 mm. Mild fat stranding is noted about the tail of the pancreas. Spleen: Normal in size without focal abnormality. Adrenals/Urinary Tract: No adrenal nodule or mass. The kidneys enhance symmetrically. No renal calculus or hydronephrosis. 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 Stomach/Bowel: Stomach is partially distended. Appendix appears normal. No evidence of bowel wall thickening, distention, or inflammatory changes. No bowel obstruction, free air, or pneumatosis. Vascular/Lymphatic: Aortic atherosclerosis. Prominent lymph nodes are present in the gastrohepatic ligament. The portal vein and superior mesenteric vein are patent. Splenic vein is not definitely seen on exam and may be occluded. Reproductive: Status post hysterectomy. No adnexal masses. Other: No abdominopelvic ascites. Musculoskeletal: Degenerative changes are present in the thoracolumbar spine. No acute or suspicious osseous abnormality. IMPRESSION: 1. Hypoenhancing masslike lesion in the tail of the pancreas measuring 9.9 x 2.9 cm. Mild surrounding fat stranding is noted. Differential diagnosis includes pancreatic adenocarcinoma versus pancreatitis. Correlation with amylase, lipase, and CA 19-9 is recommended. 2.  The portal vein and superior mesenteric vein are patent. The splenic vein is not visualized on exam and may be occluded. 3. 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 4. Scattered ill-defined hypodensities in the liver new from the previous exam in the possibility of underlying metastatic disease can not be excluded. MRI with contrast is recommended for further evaluation. 5. Aortic atherosclerosis. 6. Stable right lower lobe pulmonary nodules measuring up to 1.0 cm. Consider one of  the following in 3 months for both low-risk and high-risk individuals: (a) repeat chest CT, (b) follow-up PET-CT, or (c) tissue sampling. This recommendation follows the consensus statement: Guidelines for Management of Incidental Pulmonary Nodules Detected on CT Images: From the Fleischner Society 2017; Radiology 2017; 284:228-243. 7. Remaining incidental findings as described above. Electronically Signed: By: Thornell Sartorius M.D. On: 04/03/2023 21:57    Procedures Procedures    Medications Ordered in ED Medications  sodium chloride flush (NS) 0.9 % injection 3 mL (has no administration in time range)  prochlorperazine (COMPAZINE) injection 10 mg (has no administration in time range)  HYDROmorphone (DILAUDID) injection 0.5 mg (has no administration in time range)  0.9 %  sodium chloride infusion (has no administration in time range)  insulin aspart (novoLOG) injection 0-9 Units (has no administration in time range)  insulin aspart (novoLOG) injection 0-5 Units (has no administration in time range)  morphine (PF) 4 MG/ML injection 4 mg (4 mg Intravenous Given 04/03/23 1934)  sodium chloride 0.9 % bolus 1,000 mL (0 mLs Intravenous Stopped 04/03/23 2030)  metoCLOPramide (REGLAN) injection 5 mg (5 mg Intravenous Given 04/03/23 1933)  morphine (PF) 4 MG/ML injection 4 mg (4 mg Intravenous Given 04/03/23 2029)  iohexol (OMNIPAQUE) 300 MG/ML solution 75 mL (75 mLs  Intravenous Contrast Given 04/03/23 2031)  cefTRIAXone (ROCEPHIN) 1 g in sodium chloride 0.9 % 100 mL IVPB (1 g Intravenous New Bag/Given 04/03/23 2310)    ED Course/ Medical Decision Making/ A&P Clinical Course as of 04/03/23 2344  Sun Apr 03, 2023  2245 This is a 62 year old female presented to ED with dysuria, abdominal pain, nausea, loss of appetite, found here in the emergency department to have likely cystitis and UTI, as well as concerning findings for inflammatory mass near the tail of the pancreas potential mass within the liver, which may be concerning for malignancy.  Patient was also hyperglycemic.  Patient was given multiple rounds of pain and nausea medications, Rocephin for UTI, IV fluid bolus, and anticipate at this point medical admission for likely MRI in the morning and better differentiation of her lesion or mass.  Patient was made aware of concern for malignancy.  The patient does report to me that she has unexpectedly lost approximately 120 pounds in 1 year. [MT]    Clinical Course User Index [MT] Trifan, Kermit Balo, MD                             Medical Decision Making Amount and/or Complexity of Data Reviewed Labs: ordered. Radiology: ordered.  Risk Prescription drug management. Decision regarding hospitalization.   Initial Impression and Ddx 62 year old well-appearing female presenting for dysuria, abdominal pain and nausea.  Exam notable for epigastric tenderness.  DDx includes pancreatitis, acute cholecystitis, other intra-abdominal infection, UTI. Patient PMH that increases complexity of ED encounter:    Interpretation of Diagnostics - I independent reviewed and interpreted the labs as followed: Elevated lipase, hyponatremia, elevated BUN/creatinine, hematuria, pyuria and bacteriuria  - I independently visualized the following imaging with scope of interpretation limited to determining acute life threatening conditions related to emergency care: CT of the  abdomen pelvis, which revealed mass-like lesion in the tail the pancreas, and hypodense lesions in the liver concerning, splenic vein occlusion  Patient Reassessment and Ultimate Disposition/Management Treated UTI with ceftriaxone.  Treated her abdominal pain with morphine and nausea with Reglan.  Following persistent normal saline.  CT findings concerning for malignancy with metastasis  in the abdomen this prompted admission to the hospital for further evaluation.  Accepted by Dr. Allena Katz.  Patient management required discussion with the following services or consulting groups:  Hospitalist Service  Complexity of Problems Addressed Acute complicated illness or Injury  Additional Data Reviewed and Analyzed Further history obtained from: Further history from spouse/family member, Past medical history and medications listed in the EMR, and Prior ED visit notes  Patient Encounter Risk Assessment Consideration of hospitalization         Final Clinical Impression(s) / ED Diagnoses Final diagnoses:  Urinary tract infection with hematuria, site unspecified  Pancreatic lesion    Rx / DC Orders ED Discharge Orders     None         Gareth Eagle, PA-C 04/03/23 2344    Terald Sleeper, MD 04/03/23 2348

## 2023-04-03 NOTE — H&P (Signed)
History and Physical    [name] XLK:[id number] DOB: [birth year] DOA: 04/03/2023  PCP: Chrys Racer, MD  Patient coming from: Home  I have personally briefly reviewed patient's old medical records in [facility]  Chief Complaint: Abdominal pain  HPI: [name] is a 62 y.o. female with medical history significant for rheumatic heart disease s/p MV and TV repair 04/2022, history of pericardial effusion/tamponade s/p pericardiocentesis 05/2022, PAF on Coumadin, CKD stage IIIa, COPD, T2DM, HTN, tobacco use who presented to the ED for evaluation of abdominal pain and dysuria.  Patient reports 2 weeks of relatively constant epigastric sharp abdominal pain.  She has had intermittent episodes of nausea and vomiting without obvious bleeding.  She has had some loose stools.  She also reports dysuria and increased urinary frequency.  She reports an unintentional weight loss of nearly 120 pounds over the last year.  She denies any fevers, chills, diaphoresis, dyspnea.  ED Course  Labs/Imaging on admission: I have personally reviewed following labs and imaging studies.  Initial vitals showed BP 94/82, pulse 58, RR 16, temp 97.9 F, SpO2 95% on room air.  Labs show WBC 8.2, hemoglobin 15.4, platelets 147,000, sodium 130, potassium 3.9, bicarb 20, BUN 34, creatinine 1.31, serum glucose 305, LFTs within normal limits, lipase 63.  Urinalysis shows positive nitrates, trace leukocytes, >50 RBCs, 21-50 WBCs, many bacteria on microscopy.  CT abdomen/pelvis with contrast shows a hypoenhancing masslike lesion in the tail of the pancreas measuring 9.9 x 2.9 cm.  Mild surrounding fat stranding noted.  Portal vein and superior mesenteric vein are patent.  Splenic vein is not visualized.  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  Scattered ill-defined hypodensities in the liver are noted and due compared to previous exam.   Aortic atherosclerosis and stable right lower lobe pulmonary nodules also seen.  Patient was given 1 L normal saline, IV ceftriaxone, IV Reglan, IV morphine 4 mg.  The hospitalist service was consulted to admit for further evaluation and management.  Review of Systems: All systems reviewed and are negative except as documented in history of present illness above.   Past Medical History:  Diagnosis Date   Acute pericardial effusion 05/17/2022   AKI (acute kidney injury) (HCC) 05/17/2022   Anxiety    Arthritis    Cervicalgia    CHF (congestive heart failure) (HCC)    Chondromalacia of patella    Chronic neck pain    Chronic pain syndrome    Depression    secondary to loss of her son at age 51   Diabetic neuropathy (HCC)    DMII (diabetes mellitus, type 2) (HCC)    Dysrhythmia    Enthesopathy of hip region    Fibromyalgia    GERD (gastroesophageal reflux disease)    Hep C w/o coma, chronic (HCC)    Hepatitis C    diagnosed 2005   Hypertension    Insomnia    Low back pain    Lumbago    Mitral regurgitation    Obesity    Pain in joint, upper arm    Primary localized osteoarthrosis, lower leg    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   Substance abuse (HCC)    sober since 2001   Tobacco abuse    Tricuspid regurgitation    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    Past Surgical History:  Procedure Laterality Date   ABDOMINAL HYSTERECTOMY  at age 25, unknown reasons   CARPAL TUNNEL RELEASE  12/07/2011   left side   COLONOSCOPY WITH PROPOFOL N/A 07/30/2022   Procedure: COLONOSCOPY WITH PROPOFOL;  Surgeon: Meryl Dare, MD;  Location: [facility] ENDOSCOPY;  Service: Gastroenterology;  Laterality: N/A;   EYE SURGERY     FOOT SURGERY Bilateral    HOT HEMOSTASIS N/A 07/30/2022   Procedure: HOT HEMOSTASIS (ARGON PLASMA COAGULATION/BICAP);  Surgeon: Meryl Dare, MD;  Location: [facility] ENDOSCOPY;  Service: Gastroenterology;  Laterality: N/A;   MITRAL VALVE REPAIR  N/A 04/19/2022   Procedure: MITRAL VALVE REPAIR WITH PHYSIO II ANNULOPLASTY RING;  Surgeon: Loreli Slot, MD;  Location: [facility] OR;  Service: Open Heart Surgery;  Laterality: N/A;   PERICARDIOCENTESIS N/A 05/10/2022   Procedure: PERICARDIOCENTESIS;  Surgeon: Corky Crafts, MD;  Location: [facility] INVASIVE CV LAB;  Service: Cardiovascular;  Laterality: N/A;   POLYPECTOMY  07/30/2022   Procedure: POLYPECTOMY;  Surgeon: Meryl Dare, MD;  Location: [facility] ENDOSCOPY;  Service: Gastroenterology;;   RIGHT/LEFT HEART CATH AND CORONARY ANGIOGRAPHY N/A 02/12/2022   Procedure: RIGHT/LEFT HEART CATH AND CORONARY ANGIOGRAPHY;  Surgeon: Lyn Records, MD;  Location: [facility] INVASIVE CV LAB;  Service: Cardiovascular;  Laterality: N/A;   TEE WITHOUT CARDIOVERSION N/A 02/03/2022   Procedure: TRANSESOPHAGEAL ECHOCARDIOGRAM (TEE);  Surgeon: Wendall Stade, MD;  Location: [facility] ENDOSCOPY;  Service: Cardiovascular;  Laterality: N/A;   TEE WITHOUT CARDIOVERSION N/A 04/19/2022   Procedure: TRANSESOPHAGEAL ECHOCARDIOGRAM (TEE);  Surgeon: Loreli Slot, MD;  Location: [facility] OR;  Service: Open Heart Surgery;  Laterality: N/A;   TEE WITHOUT CARDIOVERSION N/A 07/28/2022   Procedure: TRANSESOPHAGEAL ECHOCARDIOGRAM (TEE);  Surgeon: Thurmon Fair, MD;  Location: [facility] ENDOSCOPY;  Service: Cardiovascular;  Laterality: N/A;   TRICUSPID VALVE REPLACEMENT N/A 04/19/2022   Procedure: TRICUSPID VALVE REPAIR WITH MC3 ANNULOPLASTY RING;  Surgeon: Loreli Slot, MD;  Location: [facility] OR;  Service: Open Heart Surgery;  Laterality: N/A;    Social History:  reports that she has been smoking cigarettes. She has a 8.75 pack-year smoking history. She has never used smokeless tobacco. She reports that she does not drink alcohol and does not use drugs.  Allergies  Allergen Reactions   Norco [Hydrocodone-Acetaminophen] Itching and Other (See Comments)    Tolerates Oxycodone    Family History  Problem Relation Age of Onset    Uterine cancer Mother    Alcohol abuse Father    Alcohol abuse Sister    Drug abuse Sister    Drug abuse Brother    Alcohol abuse Brother    Schizophrenia Son    Diabetes Other    Arthritis Other    Hypertension Other    Heart attack Son 33       Died suddenly     Prior to Admission medications   Medication Sig Start Date End Date Taking? Authorizing Provider  acetaminophen (TYLENOL) 500 MG tablet Take 1,000 mg by mouth every 6 (six) hours as needed for mild pain.    [provider]  albuterol (PROVENTIL HFA;VENTOLIN HFA) 108 (90 Base) MCG/ACT inhaler Inhale 2 puffs into the lungs every 6 (six) hours as needed for wheezing or shortness of breath.     [provider]  ARIPiprazole (ABILIFY) 5 MG tablet Take 2.5 mg by mouth daily. 07/20/22   [provider]  atorvastatin (LIPITOR) 20 MG tablet Take 20 mg by mouth daily.    [provider]  baclofen (LIORESAL) 10 MG tablet Take by mouth  2 (two) times daily. 04/05/20   [provider]  buPROPion (WELLBUTRIN XL) 300 MG 24 hr tablet Take 300 mg by mouth in the morning. 03/24/22   [provider]  calcium carbonate (OSCAL) 1500 (600 Ca) MG TABS tablet Take 1,500 mg by mouth daily with breakfast.    [provider]  cycloSPORINE (RESTASIS) 0.05 % ophthalmic emulsion Apply to eye as needed (dry eyes). 02/09/19   [provider]  dexlansoprazole (DEXILANT) 60 MG capsule Take 1 capsule by mouth daily. 07/26/22   [provider]  diclofenac Sodium (VOLTAREN) 1 % GEL Apply topically as needed (pain). 06/29/17   [provider]  diltiazem (CARDIZEM CD) 300 MG 24 hr capsule Take 300 mg by mouth daily. 08/20/22   [provider]  FARXIGA 10 MG TABS tablet Take 10 mg by mouth daily. 08/10/22   [provider]  FLUoxetine (PROZAC) 20 MG capsule Take 20 mg by mouth daily. 09/20/22   [provider]  furosemide (LASIX) 40 MG tablet Take 1 tablet (40 mg  total) by mouth daily. 02/28/23   Chandrasekhar, [place] Dragon A, MD  gabapentin (NEURONTIN) 300 MG capsule Take 300 mg by mouth 3 (three) times daily. 08/19/22   [provider]  hydrOXYzine (ATARAX) 25 MG tablet Take by mouth daily at 6 (six) AM. 01/16/18   [provider]  linagliptin (TRADJENTA) 5 MG TABS tablet Take 5 mg by mouth daily.    [provider]  losartan (COZAAR) 25 MG tablet Take 1 tablet (25 mg total) by mouth daily. 08/18/22 02/28/23  Arrien, York Ram, MD  lubiprostone (AMITIZA) 24 MCG capsule Take 24 mcg by mouth 2 (two) times daily with a meal.    [provider]  magnesium oxide (MAG-OX) 400 MG tablet Take by mouth 2 (two) times daily. 12/19/20   [provider]  metoprolol tartrate (LOPRESSOR) 25 MG tablet Take 0.5 tablets (12.5 mg total) by mouth 2 (two) times daily. 12/20/22   Sharlene Dory, PA-C  naloxone [facility]) nasal spray 4 mg/0.1 mL Place 1 spray into the nose as needed (accidental overdose).    [provider]  neomycin-polymyxin b-dexamethasone (MAXITROL) 3.5-10000-0.1 SUSP Place 1 drop into both eyes as needed (itching). 08/11/22   [provider]  omeprazole (PRILOSEC) 10 MG capsule Take 10 mg by mouth daily.    [provider]  oxyCODONE (ROXICODONE) 15 MG immediate release tablet Take 15 mg by mouth every 4 (four) hours as needed for pain.    [provider]  potassium chloride SA (KLOR-CON M) 20 MEQ tablet Take one tablet by mouth twice a day for one day, then take one tablet by mouth once daily thereafter. 12/22/22   Sharlene Dory, PA-C  PREMARIN 0.45 MG tablet Take 0.45 mg by mouth daily. 01/11/22   [provider]  traZODone (DESYREL) 100 MG tablet Take 100 mg by mouth at bedtime. Pt takes 2 tablets 200 mg at bedtime.    [provider]  TRINTELLIX 10 MG TABS tablet Take 10 mg by mouth daily. 10/22/22   [provider]  warfarin (COUMADIN) 5 MG tablet TAKE 1  TABLET BY MOUTH DAILY EXCEPT 1.5 TABLETS BY MOUTH ON SUNDAYS AS DIRECTED BY COUMADIN CLINIC 11/03/22   Christell Constant, MD    Physical Exam: Vitals:   04/03/23 1845 04/03/23 1900 04/03/23 2140 04/04/23 0005  BP: 116/77 131/87 (!) 140/79 (!) 144/100  Pulse:   70 92  Resp:   18  18  Temp:   97.7 F (36.5 C) 97.8 F (36.6 C)  TempSrc:   Oral Oral  SpO2: 95% 98% 97% 97%  Weight:      Height:       Constitutional: Resting supine in bed, NAD, calm, comfortable Eyes: EOMI, lids and conjunctivae normal ENMT: Mucous membranes are moist. Posterior pharynx clear of any exudate or lesions.Normal dentition.  Neck: normal, supple, no masses. Respiratory: clear to auscultation bilaterally, no wheezing, no crackles. Normal respiratory effort. No accessory muscle use.  Cardiovascular: Regular rate and rhythm, no murmurs / rubs / gallops. No extremity edema. 2+ pedal pulses. Abdomen: Epigastric tenderness, no masses palpated. Musculoskeletal: no clubbing / cyanosis. No joint deformity upper and lower extremities. Good ROM, no contractures. Normal muscle tone.  Skin: no rashes, lesions, ulcers. No induration Neurologic: Sensation intact. Strength 5/5 in all 4.  Psychiatric: Alert and oriented x 3. Normal mood.   EKG: Not performed.  Assessment/Plan Principal Problem:   Mass of pancreas Active Problems:   Acute cystitis   Paroxysmal atrial fibrillation (HCC)   COPD (chronic obstructive pulmonary disease) (HCC)   Type 2 diabetes mellitus with hyperlipidemia (HCC)   Anxiety and depression   Hypercholesterolemia   Hypertension associated with diabetes (HCC)   Rheumatic disease of heart valve   Chronic kidney disease, stage 3a (HCC)   Pulmonary nodules   [name] is a 62 y.o. female with medical history significant for rheumatic heart disease s/p MV and TV repair 04/2022, history of pericardial effusion/tamponade s/p pericardiocentesis 05/2022, PAF on Coumadin, CKD stage IIIa,  COPD, T2DM, HTN, tobacco use who presented with abdominal pain and found to have masslike lesion on the pancreatic tail concerning for pancreatic adenocarcinoma.  Assessment and Plan: Abdominal pain with masslike lesion in the tail of the pancreas and ill-defined hypodensities in the liver: 9.9 x 2.9 cm masslike lesion in the tail of the pancreas seen on CT imaging.  Findings concerning for pancreatic adenocarcinoma.  Pancreatitis also on differential.  Lipase is only 63.  LFTs within normal limits.  Patient does report unintentional weight loss of 120 pounds over the last year.  Findings discussed with patient on admission. -Obtain MR abdomen with contrast -IV fluid hydration overnight -Continue analgesics and antiemetics as needed  Acute cystitis: Patient reports dysuria and urinary frequency.  UA consistent with UTI and CT imaging suggestive of cystitis. -Continue IV ceftriaxone -Follow urine culture  Paroxysmal atrial fibrillation: Rate currently controlled.  INR is supratherapeutic at 3.6.  She denies obvious bleeding. -Hold Coumadin, repeat INR in a.m. -Continue diltiazem  Rheumatic heart disease s/p MV and TV repair 04/2022 HFpEF: Stable, appears euvolemic.  EF 60-65%. -Continue Lasix and diltiazem  CKD stage IIIa: Renal function relatively stable.  Continue to monitor.  Type 2 diabetes with hyperglycemia: Hold home meds and placed on SSI.  COPD: Stable without wheezing on admission.  Continue albuterol as needed.  Hypertension: Continue diltiazem.  Hyperlipidemia: Continue atorvastatin.  Depression/anxiety: Patient states she has had recent adjustments in her medications due to depressed mood lately.  Resumption of meds pending medication reconciliation.  Right lower lobe pulmonary nodules: Stable right lower lobe pulmonary nodules measuring up to 1.0 cm seen on CT imaging.    DVT prophylaxis: Coumadin on hold w/ supratherapeutic INR Code Status: Full code,  confirmed with patient on admission Family Communication: Discussed with patient, she has discussed with family Disposition Plan: From home and likely discharge to home pending clinical progress Consults called: None Severity of  Illness: The appropriate patient status for this patient is OBSERVATION. Observation status is judged to be reasonable and necessary in order to provide the required intensity of service to ensure the patient's safety. The patient's presenting symptoms, physical exam findings, and initial radiographic and laboratory data in the context of their medical condition is felt to place them at decreased risk for further clinical deterioration. Furthermore, it is anticipated that the patient will be medically stable for discharge from the hospital within 2 midnights of admission.   Darreld Mclean MD [facility]  If 7PM-7AM, please contact night-coverage [email or web address]  04/04/2023, 12:14 AM

## 2023-04-03 NOTE — Hospital Course (Signed)
[name] is a 62 y.o. female with medical history significant for rheumatic heart disease s/p MV and TV repair 04/2022, history of pericardial effusion/tamponade s/p pericardiocentesis 05/2022, PAF on Coumadin, CKD stage IIIa, COPD, T2DM, HTN, tobacco use who presented with abdominal pain and found to have masslike lesion on the pancreatic tail concerning for pancreatic adenocarcinoma.

## 2023-04-03 NOTE — ED Triage Notes (Signed)
Pt BIBA from home. Pt c/o sharp abd pain surrounding umbilicus x 2 weeks. No N/V/D. Pt c/o frequent urination and burning when voiding.  341 CBG

## 2023-04-04 ENCOUNTER — Observation Stay (HOSPITAL_COMMUNITY): Payer: 59

## 2023-04-04 DIAGNOSIS — K3189 Other diseases of stomach and duodenum: Secondary | ICD-10-CM | POA: Diagnosis not present

## 2023-04-04 DIAGNOSIS — I5032 Chronic diastolic (congestive) heart failure: Secondary | ICD-10-CM

## 2023-04-04 DIAGNOSIS — I4891 Unspecified atrial fibrillation: Secondary | ICD-10-CM | POA: Diagnosis not present

## 2023-04-04 DIAGNOSIS — F17210 Nicotine dependence, cigarettes, uncomplicated: Secondary | ICD-10-CM | POA: Diagnosis present

## 2023-04-04 DIAGNOSIS — I480 Paroxysmal atrial fibrillation: Secondary | ICD-10-CM | POA: Diagnosis not present

## 2023-04-04 DIAGNOSIS — E1165 Type 2 diabetes mellitus with hyperglycemia: Secondary | ICD-10-CM | POA: Diagnosis present

## 2023-04-04 DIAGNOSIS — Z8679 Personal history of other diseases of the circulatory system: Secondary | ICD-10-CM

## 2023-04-04 DIAGNOSIS — E7800 Pure hypercholesterolemia, unspecified: Secondary | ICD-10-CM | POA: Diagnosis present

## 2023-04-04 DIAGNOSIS — N3090 Cystitis, unspecified without hematuria: Secondary | ICD-10-CM

## 2023-04-04 DIAGNOSIS — D696 Thrombocytopenia, unspecified: Secondary | ICD-10-CM | POA: Diagnosis present

## 2023-04-04 DIAGNOSIS — E871 Hypo-osmolality and hyponatremia: Secondary | ICD-10-CM | POA: Diagnosis not present

## 2023-04-04 DIAGNOSIS — E1140 Type 2 diabetes mellitus with diabetic neuropathy, unspecified: Secondary | ICD-10-CM | POA: Diagnosis present

## 2023-04-04 DIAGNOSIS — N390 Urinary tract infection, site not specified: Secondary | ICD-10-CM | POA: Diagnosis not present

## 2023-04-04 DIAGNOSIS — E1169 Type 2 diabetes mellitus with other specified complication: Secondary | ICD-10-CM | POA: Diagnosis present

## 2023-04-04 DIAGNOSIS — E118 Type 2 diabetes mellitus with unspecified complications: Secondary | ICD-10-CM | POA: Diagnosis not present

## 2023-04-04 DIAGNOSIS — J449 Chronic obstructive pulmonary disease, unspecified: Secondary | ICD-10-CM | POA: Diagnosis present

## 2023-04-04 DIAGNOSIS — F419 Anxiety disorder, unspecified: Secondary | ICD-10-CM | POA: Diagnosis not present

## 2023-04-04 DIAGNOSIS — K8689 Other specified diseases of pancreas: Secondary | ICD-10-CM | POA: Diagnosis not present

## 2023-04-04 DIAGNOSIS — R319 Hematuria, unspecified: Secondary | ICD-10-CM

## 2023-04-04 DIAGNOSIS — I152 Hypertension secondary to endocrine disorders: Secondary | ICD-10-CM | POA: Diagnosis present

## 2023-04-04 DIAGNOSIS — K449 Diaphragmatic hernia without obstruction or gangrene: Secondary | ICD-10-CM | POA: Diagnosis not present

## 2023-04-04 DIAGNOSIS — N1831 Chronic kidney disease, stage 3a: Secondary | ICD-10-CM

## 2023-04-04 DIAGNOSIS — E876 Hypokalemia: Secondary | ICD-10-CM | POA: Diagnosis not present

## 2023-04-04 DIAGNOSIS — C252 Malignant neoplasm of tail of pancreas: Secondary | ICD-10-CM | POA: Diagnosis present

## 2023-04-04 DIAGNOSIS — R918 Other nonspecific abnormal finding of lung field: Secondary | ICD-10-CM | POA: Diagnosis not present

## 2023-04-04 DIAGNOSIS — I700 Atherosclerosis of aorta: Secondary | ICD-10-CM | POA: Diagnosis present

## 2023-04-04 DIAGNOSIS — K2289 Other specified disease of esophagus: Secondary | ICD-10-CM | POA: Diagnosis not present

## 2023-04-04 DIAGNOSIS — F32A Depression, unspecified: Secondary | ICD-10-CM | POA: Diagnosis present

## 2023-04-04 DIAGNOSIS — E11649 Type 2 diabetes mellitus with hypoglycemia without coma: Secondary | ICD-10-CM | POA: Diagnosis not present

## 2023-04-04 DIAGNOSIS — K209 Esophagitis, unspecified without bleeding: Secondary | ICD-10-CM | POA: Diagnosis not present

## 2023-04-04 DIAGNOSIS — R634 Abnormal weight loss: Secondary | ICD-10-CM | POA: Diagnosis present

## 2023-04-04 DIAGNOSIS — N3000 Acute cystitis without hematuria: Secondary | ICD-10-CM | POA: Diagnosis not present

## 2023-04-04 DIAGNOSIS — N183 Chronic kidney disease, stage 3 unspecified: Secondary | ICD-10-CM | POA: Diagnosis not present

## 2023-04-04 DIAGNOSIS — B9620 Unspecified Escherichia coli [E. coli] as the cause of diseases classified elsewhere: Secondary | ICD-10-CM | POA: Diagnosis present

## 2023-04-04 DIAGNOSIS — R300 Dysuria: Secondary | ICD-10-CM | POA: Diagnosis not present

## 2023-04-04 DIAGNOSIS — C787 Secondary malignant neoplasm of liver and intrahepatic bile duct: Secondary | ICD-10-CM | POA: Diagnosis present

## 2023-04-04 DIAGNOSIS — Z952 Presence of prosthetic heart valve: Secondary | ICD-10-CM | POA: Diagnosis not present

## 2023-04-04 DIAGNOSIS — I899 Noninfective disorder of lymphatic vessels and lymph nodes, unspecified: Secondary | ICD-10-CM | POA: Diagnosis not present

## 2023-04-04 DIAGNOSIS — E1122 Type 2 diabetes mellitus with diabetic chronic kidney disease: Secondary | ICD-10-CM | POA: Diagnosis present

## 2023-04-04 DIAGNOSIS — N3001 Acute cystitis with hematuria: Secondary | ICD-10-CM | POA: Diagnosis present

## 2023-04-04 DIAGNOSIS — R109 Unspecified abdominal pain: Secondary | ICD-10-CM | POA: Diagnosis present

## 2023-04-04 LAB — COMPREHENSIVE METABOLIC PANEL
ALT: 17 U/L (ref 0–44)
AST: 13 U/L — ABNORMAL LOW (ref 15–41)
Albumin: 3.9 g/dL (ref 3.5–5.0)
Alkaline Phosphatase: 63 U/L (ref 38–126)
Anion gap: 10 (ref 5–15)
BUN: 24 mg/dL — ABNORMAL HIGH (ref 8–23)
CO2: 20 mmol/L — ABNORMAL LOW (ref 22–32)
Calcium: 8.2 mg/dL — ABNORMAL LOW (ref 8.9–10.3)
Chloride: 101 mmol/L (ref 98–111)
Creatinine, Ser: 0.94 mg/dL (ref 0.44–1.00)
GFR, Estimated: >60 mL/min (ref >60)
Glucose, Bld: 297 mg/dL — ABNORMAL HIGH (ref 70–99)
Potassium: 3.7 mmol/L (ref 3.5–5.1)
Sodium: 131 mmol/L — ABNORMAL LOW (ref 135–145)
Total Bilirubin: 0.7 mg/dL (ref 0.3–1.2)
Total Protein: 6.4 g/dL — ABNORMAL LOW (ref 6.5–8.1)

## 2023-04-04 LAB — GLUCOSE, CAPILLARY
Glucose-Capillary: 295 mg/dL — ABNORMAL HIGH (ref 70–99)
Glucose-Capillary: 315 mg/dL — ABNORMAL HIGH (ref 70–99)
Glucose-Capillary: 335 mg/dL — ABNORMAL HIGH (ref 70–99)
Glucose-Capillary: 247 mg/dL — ABNORMAL HIGH (ref 70–99)
Glucose-Capillary: 309 mg/dL — ABNORMAL HIGH (ref 70–99)

## 2023-04-04 LAB — CBC
HCT: 41.9 % (ref 36.0–46.0)
Hemoglobin: 14.3 g/dL (ref 12.0–15.0)
MCH: 29.7 pg (ref 26.0–34.0)
MCHC: 34.1 g/dL (ref 30.0–36.0)
MCV: 86.9 fL (ref 80.0–100.0)
Platelets: 122 10*3/uL — ABNORMAL LOW (ref 150–400)
RBC: 4.82 MIL/uL (ref 3.87–5.11)
RDW: 15.8 % — ABNORMAL HIGH (ref 11.5–15.5)
WBC: 6.3 10*3/uL (ref 4.0–10.5)
nRBC: 0 % (ref 0.0–0.2)

## 2023-04-04 LAB — HEMOGLOBIN A1C
Hgb A1c MFr Bld: 8.7 % — ABNORMAL HIGH (ref 4.8–5.6)
Mean Plasma Glucose: 202.99 mg/dL

## 2023-04-04 LAB — PROTIME-INR
INR: 3.4 — ABNORMAL HIGH (ref 0.8–1.2)
Prothrombin Time: 34.4 seconds — ABNORMAL HIGH (ref 11.4–15.2)

## 2023-04-04 MED ORDER — FLUOXETINE HCL 20 MG PO CAPS
20.0000 mg | ORAL_CAPSULE | Freq: Every day | ORAL | Status: DC
  Administered 2023-04-04 – 2023-04-09 (×6): 20 mg via ORAL
  Filled 2023-04-04 (×6): qty 1

## 2023-04-04 MED ORDER — LUBIPROSTONE 24 MCG PO CAPS
24.0000 ug | ORAL_CAPSULE | Freq: Two times a day (BID) | ORAL | Status: DC
  Administered 2023-04-04 – 2023-04-09 (×10): 24 ug via ORAL
  Filled 2023-04-04 (×11): qty 1

## 2023-04-04 MED ORDER — OXYCODONE HCL 5 MG PO TABS
15.0000 mg | ORAL_TABLET | ORAL | Status: DC | PRN
  Administered 2023-04-04 – 2023-04-09 (×21): 15 mg via ORAL
  Filled 2023-04-04 (×21): qty 3

## 2023-04-04 MED ORDER — GADOBUTROL 1 MMOL/ML IV SOLN
8.0000 mL | Freq: Once | INTRAVENOUS | Status: AC | PRN
  Administered 2023-04-04: 8 mL via INTRAVENOUS

## 2023-04-04 MED ORDER — BUPROPION HCL ER (XL) 300 MG PO TB24
300.0000 mg | ORAL_TABLET | Freq: Every morning | ORAL | Status: DC
  Administered 2023-04-04 – 2023-04-09 (×6): 300 mg via ORAL
  Filled 2023-04-04 (×6): qty 1

## 2023-04-04 MED ORDER — DILTIAZEM HCL ER COATED BEADS 180 MG PO CP24
300.0000 mg | ORAL_CAPSULE | Freq: Every day | ORAL | Status: DC
  Administered 2023-04-04 – 2023-04-09 (×6): 300 mg via ORAL
  Filled 2023-04-04 (×6): qty 1

## 2023-04-04 MED ORDER — FUROSEMIDE 40 MG PO TABS
40.0000 mg | ORAL_TABLET | Freq: Every day | ORAL | Status: DC
  Administered 2023-04-04 – 2023-04-09 (×6): 40 mg via ORAL
  Filled 2023-04-04 (×6): qty 1

## 2023-04-04 MED ORDER — DIPHENHYDRAMINE HCL 25 MG PO CAPS
25.0000 mg | ORAL_CAPSULE | Freq: Once | ORAL | Status: AC | PRN
  Administered 2023-04-04: 25 mg via ORAL
  Filled 2023-04-04: qty 1

## 2023-04-04 MED ORDER — PANTOPRAZOLE SODIUM 40 MG PO TBEC
40.0000 mg | DELAYED_RELEASE_TABLET | Freq: Every day | ORAL | Status: DC
  Administered 2023-04-04 – 2023-04-09 (×6): 40 mg via ORAL
  Filled 2023-04-04 (×6): qty 1

## 2023-04-04 MED ORDER — TIZANIDINE HCL 4 MG PO TABS: 4.0000 mg | ORAL_TABLET | Freq: Three times a day (TID) | ORAL | Status: DC | PRN

## 2023-04-04 MED ORDER — ARIPIPRAZOLE 5 MG PO TABS
2.5000 mg | ORAL_TABLET | Freq: Every day | ORAL | Status: DC
  Administered 2023-04-04 – 2023-04-09 (×6): 2.5 mg via ORAL
  Filled 2023-04-04 (×6): qty 1

## 2023-04-04 MED ORDER — VITAMIN K1 10 MG/ML IJ SOLN
2.0000 mg | Freq: Once | INTRAVENOUS | Status: AC
  Administered 2023-04-04: 2 mg via INTRAVENOUS
  Filled 2023-04-04: qty 0.2

## 2023-04-04 MED ORDER — PANTOPRAZOLE SODIUM 40 MG PO TBEC: 40.0000 mg | DELAYED_RELEASE_TABLET | Freq: Every day | ORAL | Status: DC

## 2023-04-04 MED ORDER — WARFARIN - PHARMACIST DOSING INPATIENT: Freq: Every day | Status: DC

## 2023-04-04 MED ORDER — WARFARIN SODIUM 2.5 MG PO TABS: 2.5000 mg | ORAL_TABLET | Freq: Once | ORAL | Status: DC

## 2023-04-04 MED ORDER — GABAPENTIN 300 MG PO CAPS
300.0000 mg | ORAL_CAPSULE | Freq: Three times a day (TID) | ORAL | Status: DC
  Administered 2023-04-04 – 2023-04-09 (×15): 300 mg via ORAL
  Filled 2023-04-04 (×16): qty 1

## 2023-04-04 MED ORDER — TRAZODONE HCL 50 MG PO TABS
200.0000 mg | ORAL_TABLET | Freq: Every day | ORAL | Status: DC
  Administered 2023-04-04 – 2023-04-08 (×5): 200 mg via ORAL
  Filled 2023-04-04 (×5): qty 4

## 2023-04-04 MED ORDER — VORTIOXETINE HBR 5 MG PO TABS
10.0000 mg | ORAL_TABLET | Freq: Every day | ORAL | Status: DC
  Administered 2023-04-04 – 2023-04-09 (×6): 10 mg via ORAL
  Filled 2023-04-04 (×6): qty 2

## 2023-04-04 MED ORDER — METOPROLOL TARTRATE 25 MG PO TABS
12.5000 mg | ORAL_TABLET | Freq: Two times a day (BID) | ORAL | Status: DC
  Administered 2023-04-04 – 2023-04-09 (×11): 12.5 mg via ORAL
  Filled 2023-04-04 (×12): qty 1

## 2023-04-04 MED ORDER — ALBUTEROL SULFATE HFA 108 (90 BASE) MCG/ACT IN AERS: 2.0000 | INHALATION_SPRAY | Freq: Four times a day (QID) | RESPIRATORY_TRACT | Status: DC | PRN

## 2023-04-04 MED ORDER — ALBUTEROL SULFATE (2.5 MG/3ML) 0.083% IN NEBU: 2.5000 mg | INHALATION_SOLUTION | Freq: Four times a day (QID) | RESPIRATORY_TRACT | Status: DC | PRN

## 2023-04-04 MED ORDER — ATORVASTATIN CALCIUM 20 MG PO TABS
20.0000 mg | ORAL_TABLET | Freq: Every day | ORAL | Status: DC
  Administered 2023-04-04 – 2023-04-09 (×6): 20 mg via ORAL
  Filled 2023-04-04: qty 1
  Filled 2023-04-04 (×2): qty 2
  Filled 2023-04-04: qty 1
  Filled 2023-04-04 (×2): qty 2

## 2023-04-04 NOTE — Consult Note (Addendum)
Referring Provider: Dr. Rito Ehrlich, [facility] Primary Care Physician:  Chrys Racer, MD Primary Gastroenterologist:  Dr. Russella Dar  Reason for Consultation:  Pancreatic mass  HPI: [name] is a 62 y.o. female with medical history significant for rheumatic heart disease s/p MV and TV repair 04/2022, history of pericardial effusion/tamponade s/p pericardiocentesis 05/2022, PAF on Coumadin, CKD stage IIIa, COPD, T2DM, HTN, tobacco use who presented to the ED for evaluation of abdominal pain and dysuria.  Was noted to have abnormal UA suggesting urinary tract infection, started on Rocephin.  CT scan showed a possible mass in the pancreatic tail.   Follow-up MRI abdomen was performed and showed the following:  IMPRESSION: 1. There is a large hypoenhancing mass involving the body and tail of pancreasa compatible with pancreatic adenocarcinoma. 2. 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 At least partial encasement of the left side of the splenic artery is suspected. The portal vein, portal venous confluence and SMV do not appear to be involved. The splenic vein appears completely occluded. 3. Multiple rim enhancing lesions are identified within the liver compatible with metastatic disease. 4. Lymph node within the porta hepatic region measures 1.3 cm. Suspicious for nodal metastasis. 5. Subpleural nodule within the right lower lobe measures 7 mm. Consider further evaluation with dedicated CT of the chest.  INR is 3.4 today and they are going to hold the coumadin now.  She tells me that she has been having upper pain for about 2 weeks that just would not go away.  Has a poor appetite as well with nausea and episodes of vomiting.  She tells me that she has lost around 125 pounds over the past year or so.  Says that even thinking about food turns her stomach at times.  Past Medical History:  Diagnosis Date   Acute pericardial effusion 05/17/2022   AKI (acute  kidney injury) (HCC) 05/17/2022   Anxiety    Arthritis    Cervicalgia    CHF (congestive heart failure) (HCC)    Chondromalacia of patella    Chronic neck pain    Chronic pain syndrome    Depression    secondary to loss of her son at age 66   Diabetic neuropathy (HCC)    DMII (diabetes mellitus, type 2) (HCC)    Dysrhythmia    Enthesopathy of hip region    Fibromyalgia    GERD (gastroesophageal reflux disease)    Hep C w/o coma, chronic (HCC)    Hepatitis C    diagnosed 2005   Hypertension    Insomnia    Low back pain    Lumbago    Mitral regurgitation    Obesity    Pain in joint, upper arm    Primary localized osteoarthrosis, lower leg    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   Substance abuse (HCC)    sober since 2001   Tobacco abuse    Tricuspid regurgitation    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    Past Surgical History:  Procedure Laterality Date   ABDOMINAL HYSTERECTOMY     at age 82, unknown reasons   CARPAL TUNNEL RELEASE  12/07/2011   left side   COLONOSCOPY WITH PROPOFOL N/A 07/30/2022   Procedure: COLONOSCOPY WITH PROPOFOL;  Surgeon: Meryl Dare, MD;  Location: [facility] ENDOSCOPY;  Service: Gastroenterology;  Laterality: N/A;   EYE SURGERY     FOOT SURGERY Bilateral    HOT HEMOSTASIS N/A 07/30/2022  Procedure: HOT HEMOSTASIS (ARGON PLASMA COAGULATION/BICAP);  Surgeon: Meryl Dare, MD;  Location: [facility] ENDOSCOPY;  Service: Gastroenterology;  Laterality: N/A;   MITRAL VALVE REPAIR N/A 04/19/2022   Procedure: MITRAL VALVE REPAIR WITH PHYSIO II ANNULOPLASTY RING;  Surgeon: Loreli Slot, MD;  Location: [facility] OR;  Service: Open Heart Surgery;  Laterality: N/A;   PERICARDIOCENTESIS N/A 05/10/2022   Procedure: PERICARDIOCENTESIS;  Surgeon: Corky Crafts, MD;  Location: [facility] INVASIVE CV LAB;  Service: Cardiovascular;  Laterality: N/A;   POLYPECTOMY  07/30/2022   Procedure: POLYPECTOMY;  Surgeon: Meryl Dare, MD;  Location: [facility]  ENDOSCOPY;  Service: Gastroenterology;;   RIGHT/LEFT HEART CATH AND CORONARY ANGIOGRAPHY N/A 02/12/2022   Procedure: RIGHT/LEFT HEART CATH AND CORONARY ANGIOGRAPHY;  Surgeon: Lyn Records, MD;  Location: [facility] INVASIVE CV LAB;  Service: Cardiovascular;  Laterality: N/A;   TEE WITHOUT CARDIOVERSION N/A 02/03/2022   Procedure: TRANSESOPHAGEAL ECHOCARDIOGRAM (TEE);  Surgeon: Wendall Stade, MD;  Location: [facility] ENDOSCOPY;  Service: Cardiovascular;  Laterality: N/A;   TEE WITHOUT CARDIOVERSION N/A 04/19/2022   Procedure: TRANSESOPHAGEAL ECHOCARDIOGRAM (TEE);  Surgeon: Loreli Slot, MD;  Location: [facility] OR;  Service: Open Heart Surgery;  Laterality: N/A;   TEE WITHOUT CARDIOVERSION N/A 07/28/2022   Procedure: TRANSESOPHAGEAL ECHOCARDIOGRAM (TEE);  Surgeon: Thurmon Fair, MD;  Location: [facility] ENDOSCOPY;  Service: Cardiovascular;  Laterality: N/A;   TRICUSPID VALVE REPLACEMENT N/A 04/19/2022   Procedure: TRICUSPID VALVE REPAIR WITH MC3 ANNULOPLASTY RING;  Surgeon: Loreli Slot, MD;  Location: [facility] OR;  Service: Open Heart Surgery;  Laterality: N/A;    Prior to Admission medications   Medication Sig Start Date End Date Taking? Authorizing Provider  albuterol (PROVENTIL HFA;VENTOLIN HFA) 108 (90 Base) MCG/ACT inhaler Inhale 2 puffs into the lungs every 6 (six) hours as needed for wheezing or shortness of breath.    Yes [provider]  amLODipine (NORVASC) 5 MG tablet Take 5 mg by mouth daily. 03/11/23  Yes [provider]  ARIPiprazole (ABILIFY) 5 MG tablet Take 2.5 mg by mouth daily. 07/20/22  Yes [provider]  atorvastatin (LIPITOR) 20 MG tablet Take 20 mg by mouth daily.   Yes [provider]  baclofen (LIORESAL) 10 MG tablet Take 10 mg by mouth 2 (two) times daily. 04/05/20  Yes [provider]  buPROPion (WELLBUTRIN XL) 300 MG 24 hr tablet Take 300 mg by mouth in the morning. 03/24/22  Yes [provider]  dexlansoprazole (DEXILANT) 60  MG capsule Take 60 mg by mouth daily. 07/26/22  Yes [provider]  diclofenac Sodium (VOLTAREN) 1 % GEL Apply 2 g topically 4 (four) times daily as needed (pain). 06/29/17  Yes [provider]  FARXIGA 10 MG TABS tablet Take 10 mg by mouth daily. 08/10/22  Yes [provider]  FLUoxetine (PROZAC) 20 MG capsule Take 20 mg by mouth daily. 09/20/22  Yes [provider]  furosemide (LASIX) 40 MG tablet Take 1 tablet (40 mg total) by mouth daily. 02/28/23  Yes Chandrasekhar, Mahesh A, MD  gabapentin (NEURONTIN) 300 MG capsule Take 300 mg by mouth 3 (three) times daily. 08/19/22  Yes [provider]  hydrOXYzine (ATARAX) 25 MG tablet Take 25 mg by mouth daily. 01/16/18  Yes [provider]  linagliptin (TRADJENTA) 5 MG TABS tablet Take 5 mg by mouth daily.   Yes [provider]  losartan (COZAAR) 25 MG tablet Take 1 tablet (25 mg total) by mouth daily. 08/18/22 04/04/23 Yes Arrien, Mauricio  Reuel Boom, MD  lubiprostone (AMITIZA) 24 MCG capsule Take 24 mcg by mouth 2 (two) times daily with a meal.   Yes [provider]  metoprolol tartrate (LOPRESSOR) 25 MG tablet Take 0.5 tablets (12.5 mg total) by mouth 2 (two) times daily. 12/20/22  Yes Conte, Tessa N, PA-C  omeprazole (PRILOSEC) 10 MG capsule Take 10 mg by mouth daily.   Yes [provider]  oxyCODONE (ROXICODONE) 15 MG immediate release tablet Take 15 mg by mouth every 4 (four) hours as needed for pain.   Yes [provider]  potassium chloride SA (KLOR-CON M) 20 MEQ tablet Take one tablet by mouth twice a day for one day, then take one tablet by mouth once daily thereafter. Patient taking differently: Take 20 mEq by mouth daily. Take one tablet by mouth twice a day for one day, then take one tablet by mouth once daily thereafter. 12/22/22  Yes Conte, Tessa N, PA-C  PREMARIN 0.45 MG tablet Take 0.45 mg by mouth daily. 01/11/22  Yes [provider]  QUEtiapine (SEROQUEL)  25 MG tablet Take 25 mg by mouth as needed. 03/17/23  Yes [provider]  TIADYLT ER 300 MG 24 hr capsule Take 300 mg by mouth daily. 03/11/23  Yes [provider]  tiZANidine (ZANAFLEX) 4 MG tablet Take 4 mg by mouth 3 (three) times daily as needed for muscle spasms. 03/11/23  Yes [provider]  traZODone (DESYREL) 100 MG tablet Take 200 mg by mouth at bedtime. Pt takes 2 tablets 200 mg at bedtime.   Yes [provider]  TRINTELLIX 10 MG TABS tablet Take 10 mg by mouth daily. 10/22/22  Yes [provider]  warfarin (COUMADIN) 5 MG tablet TAKE 1 TABLET BY MOUTH DAILY EXCEPT 1.5 TABLETS BY MOUTH ON SUNDAYS AS DIRECTED BY COUMADIN CLINIC Patient taking differently: Take 2.5-5 mg by mouth See admin instructions. Take 5mg  by mouth daily except 2.5mg  by mouth on Sundays. 11/03/22  Yes Chandrasekhar, Mahesh A, MD  naloxone (NARCAN) nasal spray 4 mg/0.1 mL Place 1 spray into the nose as needed (accidental overdose).    [provider]    Current Facility-Administered Medications  Medication Dose Route Frequency Provider Last Rate Last Admin   albuterol (PROVENTIL) (2.5 MG/3ML) 0.083% nebulizer solution 2.5 mg  2.5 mg Nebulization Q6H PRN Charlsie Quest, MD       ARIPiprazole (ABILIFY) tablet 2.5 mg  2.5 mg Oral Daily Osvaldo Shipper, MD       atorvastatin (LIPITOR) tablet 20 mg  20 mg Oral Daily Darreld Mclean R, MD   20 mg at 04/04/23 0930   buPROPion (WELLBUTRIN XL) 24 hr tablet 300 mg  300 mg Oral q AM Osvaldo Shipper, MD       cefTRIAXone (ROCEPHIN) 1 g in sodium chloride 0.9 % 100 mL IVPB  1 g Intravenous Q24H Darreld Mclean R, MD       diltiazem (CARDIZEM CD) 24 hr capsule 300 mg  300 mg Oral Daily Darreld Mclean R, MD   300 mg at 04/04/23 0931   FLUoxetine (PROZAC) capsule 20 mg  20 mg Oral Daily Osvaldo Shipper, MD       furosemide (LASIX) tablet 40 mg  40 mg Oral Daily Darreld Mclean R, MD   40 mg at 04/04/23 0930   gabapentin (NEURONTIN) capsule  300 mg  300 mg Oral TID Osvaldo Shipper, MD       HYDROmorphone (DILAUDID) injection 0.5 mg  0.5 mg Intravenous Q3H  PRN Darreld Mclean R, MD   0.5 mg at 04/04/23 0756   insulin aspart (novoLOG) injection 0-5 Units  0-5 Units Subcutaneous QHS Charlsie Quest, MD   4 Units at 04/04/23 0045   insulin aspart (novoLOG) injection 0-9 Units  0-9 Units Subcutaneous TID WC Charlsie Quest, MD   5 Units at 04/04/23 0755   lubiprostone (AMITIZA) capsule 24 mcg  24 mcg Oral BID WC Osvaldo Shipper, MD       metoprolol tartrate (LOPRESSOR) tablet 12.5 mg  12.5 mg Oral BID Osvaldo Shipper, MD       oxyCODONE (Oxy IR/ROXICODONE) immediate release tablet 15 mg  15 mg Oral Q4H PRN Osvaldo Shipper, MD       pantoprazole (PROTONIX) EC tablet 40 mg  40 mg Oral Daily Darreld Mclean R, MD   40 mg at 04/04/23 0930   prochlorperazine (COMPAZINE) injection 10 mg  10 mg Intravenous Q6H PRN Darreld Mclean R, MD       sodium chloride flush (NS) 0.9 % injection 3 mL  3 mL Intravenous Q12H Darreld Mclean R, MD   3 mL at 04/04/23 0933   tiZANidine (ZANAFLEX) tablet 4 mg  4 mg Oral TID PRN Osvaldo Shipper, MD       traZODone (DESYREL) tablet 200 mg  200 mg Oral QHS Osvaldo Shipper, MD       vortioxetine HBr (TRINTELLIX) tablet 10 mg  10 mg Oral Daily Osvaldo Shipper, MD       warfarin (COUMADIN) tablet 2.5 mg  2.5 mg Oral ONCE-[address] [place], Leane Para, [facility]       Warfarin - Pharmacist Dosing Inpatient   Does not apply q1600 Phylliss Blakes, [facility]        Allergies as of 04/03/2023 - Review Complete 04/03/2023  Allergen Reaction Noted   Norco [hydrocodone-acetaminophen] Itching and Other (See Comments) 12/11/2016    Family History  Problem Relation Age of Onset   Uterine cancer Mother    Alcohol abuse Father    Alcohol abuse Sister    Drug abuse Sister    Drug abuse Brother    Alcohol abuse Brother    Schizophrenia Son    Diabetes Other    Arthritis Other    Hypertension Other    Heart attack Son 29       Died suddenly     Social History   Socioeconomic History   Marital status: Single    Spouse name: Not on file   Number of children: 2   Years of education: Not on file   Highest education level: Not on file  Occupational History   Occupation: CNA    Comment: worked for 15 years   Occupation: disability  Tobacco Use   Smoking status: Every Day    Packs/day: 0.25    Years: 35.00    Additional pack years: 0.00    Total pack years: 8.75    Types: Cigarettes   Smokeless tobacco: Never   Tobacco comments:    4 cigarettes a day  Vaping Use   Vaping Use: Some days  Substance and Sexual Activity   Alcohol use: No   Drug use: No   Sexual activity: Not Currently    Comment: Celebate since 2000  Other Topics Concern   Not on file  Social History Narrative   Born and raised by mom until mom died when she was 14yo. After that she stayed with her aunt. Dad was married to someone else and was never  around.  Pt has 1 brother and 1 sister and pt is the middle child. Pt had a very rough childhood. Never married. 2 kids one son died several  yrs ago and other son has Schizophrenia. Pt has graduated HS and has some college. Pt is on disability and is unemployed. She used to work as a Lawyer until 2000.       Financial assistance approved for 100% discount at [facility] and has [facility] card   Rudell Cobb  September 29, 2010 2:27 PM   Social Determinants of Health   Financial Resource Strain: Not on file  Food Insecurity: No Food Insecurity (04/04/2023)   Hunger Vital Sign    Worried About Running Out of Food in the Last Year: Never true    Ran Out of Food in the Last Year: Never true  Transportation Needs: No Transportation Needs (04/04/2023)   PRAPARE - Administrator, Civil Service (Medical): No    Lack of Transportation (Non-Medical): No  Physical Activity: Not on file  Stress: Not on file  Social Connections: Not on file  Intimate Partner Violence: Not At Risk (04/04/2023)   Humiliation, Afraid,  Rape, and Kick questionnaire    Fear of Current or Ex-Partner: No    Emotionally Abused: No    Physically Abused: No    Sexually Abused: No    Review of Systems: ROS is O/W negative except as mentioned in HPI.  Physical Exam: Vital signs in last 24 hours: Temp:  [97.7 F (36.5 C)-98.1 F (36.7 C)] 98.1 F (36.7 C) (04/29 0800) Pulse Rate:  [55-98] 64 (04/29 0800) Resp:  [16-18] 16 (04/29 0800) BP: (94-172)/(75-115) 172/107 (04/29 0800) SpO2:  [95 %-99 %] 99 % (04/29 0542) Weight:  [74.2 kg-79.4 kg] 74.2 kg (04/29 0542) Last BM Date : 04/02/23 General:  Alert,  Well-developed, well-nourished, pleasant and cooperative in NAD Head:  Normocephalic and atraumatic. Eyes:  Sclera clear, no icterus.  Conjunctiva pink. Ears:  Normal auditory acuity. Mouth:  No deformity or lesions.   Lungs:  Clear throughout to auscultation.   No wheezes, crackles, or rhonchi.  Heart:  Regular rate and rhythm; no murmurs, clicks, rubs, or gallops. Abdomen:  Soft, non-distended.  BS present.  Upper abdominal TTP.    Msk:  Symmetrical without gross deformities. Pulses:  Normal pulses noted. Extremities:  Without clubbing or edema. Neurologic:  Alert and oriented x 4;  grossly normal neurologically. Skin:  Intact without significant lesions or rashes. Psych:  Alert and cooperative. Normal mood and affect.  Intake/Output from previous day: 04/28 0701 - 04/29 0700 In: 1454.6 [I.V.:388.9; IV Piggyback:1065.7] Out: -   Lab Results: Recent Labs    04/03/23 1903 04/04/23 0746  WBC 8.2 6.3  HGB 15.4* 14.3  HCT 45.3 41.9  PLT 147* 122*   BMET Recent Labs    04/03/23 1903 04/04/23 0746  NA 130* 131*  K 3.9 3.7  CL 97* 101  CO2 20* 20*  GLUCOSE 305* 297*  BUN 34* 24*  CREATININE 1.31* 0.94  CALCIUM 8.7* 8.2*   LFT Recent Labs    04/04/23 0746  PROT 6.4*  ALBUMIN 3.9  AST 13*  ALT 17  ALKPHOS 63  BILITOT 0.7   PT/INR Recent Labs    04/03/23 2310 04/04/23 0746  LABPROT 35.4*  34.4*  INR 3.6* 3.4*   Studies/Results: MR ABDOMEN W WO CONTRAST  Result Date: 04/04/2023 CLINICAL DATA:  Evaluate pancreas lesion. EXAM: MRI ABDOMEN WITHOUT AND WITH CONTRAST TECHNIQUE:  Multiplanar multisequence MR imaging of the abdomen was performed both before and after the administration of intravenous contrast. CONTRAST:  8mL GADAVIST GADOBUTROL 1 MMOL/ML IV SOLN COMPARISON:  CT 04/03/2023 FINDINGS: Lower chest: No acute findings. Subpleural nodule within the right lower lobe measures 7 mm, image 11/17. Hepatobiliary: Multiple rim enhancing lesions are identified within the liver. The largest is in segment 4 B measuring 3.4 x 3.1, image 32/17. Lesion within segment 6 measures 1.4 by 1.0 cm, image 54/17. Gallbladder appears within normal limits. No gallstones or gallbladder wall edema. The common bile duct measures 6 mm in maximum diameter which is considered upper limits of normal. Pancreas: There is a large mass involving the body and tail of pancreas which is hypoenhancing measuring 6.4 by 2.2 cm, image 37/19. There is diffuse dilatation of the main duct and side branches of the main duct within the tail of pancreas compatible with main ductal obstruction. Encasement of the celiac artery and its branches are identified. At least partial encasement of the left side of the splenic artery is suspected, image 40/19. The portal vein, portal venous confluence and SMV do not appear to be involved. The splenic vein appears completely occluded. Spleen:  Within normal limits in size and appearance. Adrenals/Urinary Tract: Normal adrenal glands. No signs of kidney mass or hydronephrosis. Stomach/Bowel: Stomach appears nondistended. No dilated bowel loops identified. Vascular/Lymphatic: Normal appearance of the abdominal aorta. Lymph node within the porta hepatic region measures 1.3 cm, image 30/17. Other: Trace fluid identified around the spleen. No focal fluid collections identified. Musculoskeletal: No  suspicious bone lesions identified. IMPRESSION: 1. There is a large hypoenhancing mass involving the body and tail of pancreasa compatible with pancreatic adenocarcinoma. 2. 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 At least partial encasement of the left side of the splenic artery is suspected. The portal vein, portal venous confluence and SMV do not appear to be involved. The splenic vein appears completely occluded. 3. Multiple rim enhancing lesions are identified within the liver compatible with metastatic disease. 4. Lymph node within the porta hepatic region measures 1.3 cm. Suspicious for nodal metastasis. 5. Subpleural nodule within the right lower lobe measures 7 mm. Consider further evaluation with dedicated CT of the chest. Electronically Signed   By: Signa Kell M.D.   On: 04/04/2023 07:46   MR 3D Recon At Scanner  Result Date: 04/04/2023 CLINICAL DATA:  Evaluate pancreas lesion. EXAM: MRI ABDOMEN WITHOUT AND WITH CONTRAST TECHNIQUE: Multiplanar multisequence MR imaging of the abdomen was performed both before and after the administration of intravenous contrast. CONTRAST:  8mL GADAVIST GADOBUTROL 1 MMOL/ML IV SOLN COMPARISON:  CT 04/03/2023 FINDINGS: Lower chest: No acute findings. Subpleural nodule within the right lower lobe measures 7 mm, image 11/17. Hepatobiliary: Multiple rim enhancing lesions are identified within the liver. The largest is in segment 4 B measuring 3.4 x 3.1, image 32/17. Lesion within segment 6 measures 1.4 by 1.0 cm, image 54/17. Gallbladder appears within normal limits. No gallstones or gallbladder wall edema. The common bile duct measures 6 mm in maximum diameter which is considered upper limits of normal. Pancreas: There is a large mass involving the body and tail of pancreas which is hypoenhancing measuring 6.4 by 2.2 cm, image 37/19. There is diffuse dilatation of the main duct and side branches of the main duct within the tail of  pancreas compatible with main ductal obstruction. Encasement of the celiac artery and its branches are identified. At least partial encasement  of the left side of the splenic artery is suspected, image 40/19. The portal vein, portal venous confluence and SMV do not appear to be involved. The splenic vein appears completely occluded. Spleen:  Within normal limits in size and appearance. Adrenals/Urinary Tract: Normal adrenal glands. No signs of kidney mass or hydronephrosis. Stomach/Bowel: Stomach appears nondistended. No dilated bowel loops identified. Vascular/Lymphatic: Normal appearance of the abdominal aorta. Lymph node within the porta hepatic region measures 1.3 cm, image 30/17. Other: Trace fluid identified around the spleen. No focal fluid collections identified. Musculoskeletal: No suspicious bone lesions identified. IMPRESSION: 1. There is a large hypoenhancing mass involving the body and tail of pancreasa compatible with pancreatic adenocarcinoma. 2. 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 At least partial encasement of the left side of the splenic artery is suspected. The portal vein, portal venous confluence and SMV do not appear to be involved. The splenic vein appears completely occluded. 3. Multiple rim enhancing lesions are identified within the liver compatible with metastatic disease. 4. Lymph node within the porta hepatic region measures 1.3 cm. Suspicious for nodal metastasis. 5. Subpleural nodule within the right lower lobe measures 7 mm. Consider further evaluation with dedicated CT of the chest. Electronically Signed   By: Signa Kell M.D.   On: 04/04/2023 07:46   CT ABDOMEN PELVIS W CONTRAST  Addendum Date: 04/03/2023   ADDENDUM REPORT: 04/03/2023 22:33 ADDENDUM: Critical Value/emergent results were called by telephone at the time of interpretation on 04/03/2023 at 10:33 pm to provider Riki Sheer , who verbally acknowledged these results.  Electronically Signed   By: Thornell Sartorius M.D.   On: 04/03/2023 22:33   Result Date: 04/03/2023 CLINICAL DATA:  Abdominal pain, acute, nonlocalized. EXAM: CT ABDOMEN AND PELVIS WITH CONTRAST TECHNIQUE: Multidetector CT imaging of the abdomen and pelvis was performed using the standard protocol following bolus administration of intravenous contrast. RADIATION DOSE REDUCTION: This exam was performed according to the departmental dose-optimization program which includes automated exposure control, adjustment of the mA and/or kV according to patient size and/or use of iterative reconstruction technique. CONTRAST:  75mL OMNIPAQUE IOHEXOL 300 MG/ML  SOLN COMPARISON:  12/18/2020. FINDINGS: Lower chest: A 6 mm nodule is present in the right lower lobe, axial image 9. A 1 cm nodule is noted in the right lower lobe, axial image 23. Hepatobiliary: A 1.2 cm hypodensity with surrounding enhancement is noted in the right lobe of the liver, segment 5. An ill-defined hypodensity is present in the left lobe of the liver measuring 2.3 cm, hepatic segment 4B. An ill-defined hypodensity is present in the left lobe of the liver measuring 9 mm, hepatic segment 2. No biliary ductal dilatation. The gallbladder is without stones. Pancreas: An ill-defined hypodense mass is present in the distal pancreas measuring 9.9 x 2.9 cm. There is mild pancreatic ductal dilatation at the head of the pancreas measuring 4 mm. Mild fat stranding is noted about the tail of the pancreas. Spleen: Normal in size without focal abnormality. Adrenals/Urinary Tract: No adrenal nodule or mass. The kidneys enhance symmetrically. No renal calculus or hydronephrosis. 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 Stomach/Bowel: Stomach is partially distended. Appendix appears normal. No evidence of bowel wall thickening, distention, or inflammatory changes. No bowel obstruction, free air, or pneumatosis.  Vascular/Lymphatic: Aortic atherosclerosis. Prominent lymph nodes are present in the gastrohepatic ligament. The portal vein and superior mesenteric vein are patent. Splenic vein is not definitely  seen on exam and may be occluded. Reproductive: Status post hysterectomy. No adnexal masses. Other: No abdominopelvic ascites. Musculoskeletal: Degenerative changes are present in the thoracolumbar spine. No acute or suspicious osseous abnormality. IMPRESSION: 1. Hypoenhancing masslike lesion in the tail of the pancreas measuring 9.9 x 2.9 cm. Mild surrounding fat stranding is noted. Differential diagnosis includes pancreatic adenocarcinoma versus pancreatitis. Correlation with amylase, lipase, and CA 19-9 is recommended. 2. The portal vein and superior mesenteric vein are patent. The splenic vein is not visualized on exam and may be occluded. 3. 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 4. Scattered ill-defined hypodensities in the liver new from the previous exam in the possibility of underlying metastatic disease can not be excluded. MRI with contrast is recommended for further evaluation. 5. Aortic atherosclerosis. 6. Stable right lower lobe pulmonary nodules measuring up to 1.0 cm. Consider one of the following in 3 months for both low-risk and high-risk individuals: (a) repeat chest CT, (b) follow-up PET-CT, or (c) tissue sampling. This recommendation follows the consensus statement: Guidelines for Management of Incidental Pulmonary Nodules Detected on CT Images: From the Fleischner Society 2017; Radiology 2017; 284:228-243. 7. Remaining incidental findings as described above. Electronically Signed: By: Thornell Sartorius M.D. On: 04/03/2023 21:57    IMPRESSION:  Mass in the pancreatic tail with hypodensities in the liver 9.9 x 2.9 cm masslike lesion in the tail of the pancreas seen on CT imaging.  Findings concerning for pancreatic adenocarcinoma.   Lipase is only 63.  LFTs within normal limits.  Patient does report unintentional weight loss of 125 pounds over the last year. Patient underwent MRI this morning.  Large hypoenhancing mass involving the body and tail of pancreas was noted.  Compatible with pancreatic adenocarcinoma.  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  Multiple rim-enhancing lesions also noted in the liver compatible with metastatic disease.  Suspicious appearing lymph nodes also noted.  Pulmonary nodules noted.  CA 19-9 is in process.  Paroxysmal atrial fibrillation:  On coumadin.  INR 3.4 today.  History of rheumatic heart disease status post mitral valve and tricuspid valve repair in May 2023/chronic diastolic CHF   Chronic kidney disease stage IIIa/hyponatremia   Acute cystitis:  On cetriaxone.  PLAN: -Plan is for EUS with Dr. Meridee Score on Thursday, 5/2. -Pain control per primary service.  I did order a K-pad for her to try as well. -Follow-up CA19-9.   Princella Pellegrini. Zehr  04/04/2023, 12:33 PM   Attending physician's note   I have taken history, reviewed the chart and examined the patient. I performed a substantive portion of this encounter, including complete performance of at least one of the key components, in conjunction with the APP. I agree with the Advanced Practitioner's note, impression and recommendations.   10cm pancreatic tail mass with likely LN/liver mets and tumor encasement of celiac artery/SV 125lb wt loss with back pain likely d/t above PAF on coumadin. INR 3.4 today H/O rheumatic heart disease s/p MV/TV repair in May 2023 CKD   Plan: -She wants to try regular diet. Will order.  -Check CA 19-9 -EUS with Dr. Meridee Score planned for 5/2 (INR/scheduling issues) -Trend INR. Needs to be <2.   Edman Circle, MD [facility] [phone]

## 2023-04-04 NOTE — Plan of Care (Signed)
  Problem: Clinical Measurements: Goal: Ability to maintain clinical measurements within normal limits will improve Outcome: Progressing Goal: Will remain free from infection Outcome: Progressing Goal: Diagnostic test results will improve Outcome: Progressing Goal: Respiratory complications will improve Outcome: Progressing Goal: Cardiovascular complication will be avoided Outcome: Progressing   Problem: Activity: Goal: Risk for activity intolerance will decrease Outcome: Progressing   Problem: Nutrition: Goal: Adequate nutrition will be maintained Outcome: Progressing   Problem: Coping: Goal: Level of anxiety will decrease Outcome: Progressing   Problem: Pain Managment: Goal: General experience of comfort will improve Outcome: Progressing

## 2023-04-04 NOTE — Progress Notes (Addendum)
[facility] PROGRESS NOTE   [name] WUJ:[id number] DOB: October 20, 1961 DOA: 04/03/2023  PCP: Chrys Racer, MD  Brief History/Interval Summary: 62 y.o. female with medical history significant for rheumatic heart disease s/p MV and TV repair 04/2022, history of pericardial effusion/tamponade s/p pericardiocentesis 05/2022, PAF on Coumadin, CKD stage IIIa, COPD, T2DM, HTN, tobacco use who presented to the ED for evaluation of abdominal pain and dysuria.  Was noted to have abnormal UA suggesting urinary tract infection.  CT scan however also showed a possible mass in the pancreatic tail.  She was hospitalized for further management.  Consultants: None yet  Procedures: None yet    Subjective/Interval History: Patient continues to have 8 out of 10 abdominal pain in the upper abdomen.  No nausea or vomiting.  No diarrhea.  No fever or chills.  Continues to have burning sensation while urinating.    Assessment/Plan:  Mass in the pancreatic tail with hypodensities in the liver 9.9 x 2.9 cm masslike lesion in the tail of the pancreas seen on CT imaging.  Findings concerning for pancreatic adenocarcinoma.  Pancreatitis also on differential.  However lipase is only 63.  LFTs within normal limits.  Patient does report unintentional weight loss of 120 pounds over the last year. Patient underwent MRI this morning.  Large hypoenhancing mass involving the body and tail of pancreas was noted.  Compatible with pancreatic adenocarcinoma.  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  Multiple rim-enhancing lesions also noted in the liver compatible with metastatic disease. Suspicious appearing lymph nodes also noted. Pulmonary nodules noted.  May need CT chest as well. Patient will need tissue diagnosis.  Will discuss with gastroenterology. CA 19-9 is in process. GI plans to do EUS and biopsy in the next day or so. They want INR less than 2.0. Discussed with  cardiology who is ok with reversal. Will give Vit k and recheck in AM.   Acute cystitis UA was abnormal.  Patient was experiencing dysuria and urinary frequency.  Continue ceftriaxone.  Follow-up on cultures.  Paroxysmal atrial fibrillation Continue Cardizem.  Also on Coumadin for anticoagulation.  Coumadin being managed by pharmacy.  History of rheumatic heart disease status post mitral valve and tricuspid valve repair in May 2023/chronic diastolic CHF EF is 60 to 65%. Followed by [facility] cardiology. Continue cardiac medications.  Chronic kidney disease stage IIIa/hyponatremia Stable.  Renal function actually better today compared to yesterday.  Monitor urine output.  Avoid nephrotoxic agents.  Diabetes mellitus type 2, uncontrolled with hyperglycemia Currently on SSI.  Patient was on Farxiga and linagliptin prior to admission. HbA1c 8.7.  History of COPD Stable.  Essential hypertension Monitor blood pressures closely.  Hyperlipidemia Continue atorvastatin.  History of depression and anxiety Patient has had recent adjustments to her medications due to depressed mood lately. Noted to be on Abilify, Wellbutrin, Prozac, gabapentin, Seroquel, trazodone and Trintellix prior to admission.  Right-sided pulmonary nodules These were seen on CT angiogram chest done in June 2023.  Noted to be stable.  May need dedicated CT chest especially in view of the newly detected Pancreatic mass which is highly suspicious for being malignant.   DVT Prophylaxis: On warfarin Code Status: Full code Family Communication: Discussed with patient Disposition Plan: To be determined  Status is: Observation The patient will require care spanning > 2 midnights and should be moved to inpatient because: Significant abdominal pain requiring IV pain medications      Medications: Scheduled:  atorvastatin  20 mg  Oral Daily   diltiazem  300 mg Oral Daily   furosemide  40 mg Oral Daily   insulin aspart  0-5  Units Subcutaneous QHS   insulin aspart  0-9 Units Subcutaneous TID WC   pantoprazole  40 mg Oral Daily   sodium chloride flush  3 mL Intravenous Q12H   Warfarin - Pharmacist Dosing Inpatient   Does not apply q1600   Continuous:  sodium chloride 125 mL/hr at 04/04/23 0319   cefTRIAXone (ROCEPHIN)  IV     ZOX:[id number], HYDROmorphone (DILAUDID) injection, prochlorperazine  Antibiotics: Anti-infectives (From admission, onward)    Start     Dose/Rate Route Frequency Ordered Stop   04/04/23 2200  cefTRIAXone (ROCEPHIN) 1 g in sodium chloride 0.9 % 100 mL IVPB        1 g 200 mL/hr over 30 Minutes Intravenous Every 24 hours 04/03/23 2345     04/03/23 2230  cefTRIAXone (ROCEPHIN) 1 g in sodium chloride 0.9 % 100 mL IVPB        1 g 200 mL/hr over 30 Minutes Intravenous  Once 04/03/23 2223 04/03/23 2340       Objective:  Vital Signs  Vitals:   04/03/23 1900 04/03/23 2140 04/04/23 0005 04/04/23 0542  BP: 131/87 (!) 140/79 (!) 144/100 (!) 151/115  Pulse:  70 92 98  Resp:  18 18 18   Temp:  97.7 F (36.5 C) 97.8 F (36.6 C) 97.8 F (36.6 C)  TempSrc:  Oral Oral Oral  SpO2: 98% 97% 97% 99%  Weight:    74.2 kg  Height:        Intake/Output Summary (Last 24 hours) at 04/04/2023 0929 Last data filed at 04/04/2023 0319 Gross per 24 hour  Intake 1454.61 ml  Output --  Net 1454.61 ml   Filed Weights   04/03/23 1629 04/04/23 0542  Weight: 79.4 kg 74.2 kg    General appearance: Awake alert.  In no distress Resp: Clear to auscultation bilaterally.  Normal effort Cardio: S1-S2 is normal regular.  No S3-S4.  No rubs murmurs or bruit GI: Abdomen is soft.  Tender in the epigastric area without any rebound rigidity or guarding. Extremities: No edema.  Full range of motion of lower extremities. Neurologic: Alert and oriented x3.  No focal neurological deficits.    Lab Results:  Data Reviewed: I have personally reviewed following labs and reports of the imaging  studies  CBC: Recent Labs  Lab 04/03/23 1903 04/04/23 0746  WBC 8.2 6.3  NEUTROABS 5.6  --   HGB 15.4* 14.3  HCT 45.3 41.9  MCV 86.3 86.9  PLT 147* 122*    Basic Metabolic Panel: Recent Labs  Lab 04/03/23 1903 04/04/23 0746  NA 130* 131*  K 3.9 3.7  CL 97* 101  CO2 20* 20*  GLUCOSE 305* 297*  BUN 34* 24*  CREATININE 1.31* 0.94  CALCIUM 8.7* 8.2*    GFR: Estimated Creatinine Clearance: 68.5 mL/min (by C-G formula based on SCr of 0.94 mg/dL).  Liver Function Tests: Recent Labs  Lab 04/03/23 1903 04/04/23 0746  AST 15 13*  ALT 19 17  ALKPHOS 68 63  BILITOT 0.5 0.7  PROT 7.5 6.4*  ALBUMIN 4.3 3.9    Recent Labs  Lab 04/03/23 1903  LIPASE 63*   Coagulation Profile: Recent Labs  Lab 04/03/23 2310 04/04/23 0746  INR 3.6* 3.4*     CBG: Recent Labs  Lab 04/04/23 0008 04/04/23 0742  GLUCAP 315* 295*     Radiology Studies:  MR ABDOMEN W WO CONTRAST  Result Date: 04/04/2023 CLINICAL DATA:  Evaluate pancreas lesion. EXAM: MRI ABDOMEN WITHOUT AND WITH CONTRAST TECHNIQUE: Multiplanar multisequence MR imaging of the abdomen was performed both before and after the administration of intravenous contrast. CONTRAST:  8mL GADAVIST GADOBUTROL 1 MMOL/ML IV SOLN COMPARISON:  CT 04/03/2023 FINDINGS: Lower chest: No acute findings. Subpleural nodule within the right lower lobe measures 7 mm, image 11/17. Hepatobiliary: Multiple rim enhancing lesions are identified within the liver. The largest is in segment 4 B measuring 3.4 x 3.1, image 32/17. Lesion within segment 6 measures 1.4 by 1.0 cm, image 54/17. Gallbladder appears within normal limits. No gallstones or gallbladder wall edema. The common bile duct measures 6 mm in maximum diameter which is considered upper limits of normal. Pancreas: There is a large mass involving the body and tail of pancreas which is hypoenhancing measuring 6.4 by 2.2 cm, image 37/19. There is diffuse dilatation of the main duct and side  branches of the main duct within the tail of pancreas compatible with main ductal obstruction. Encasement of the celiac artery and its branches are identified. At least partial encasement of the left side of the splenic artery is suspected, image 40/19. The portal vein, portal venous confluence and SMV do not appear to be involved. The splenic vein appears completely occluded. Spleen:  Within normal limits in size and appearance. Adrenals/Urinary Tract: Normal adrenal glands. No signs of kidney mass or hydronephrosis. Stomach/Bowel: Stomach appears nondistended. No dilated bowel loops identified. Vascular/Lymphatic: Normal appearance of the abdominal aorta. Lymph node within the porta hepatic region measures 1.3 cm, image 30/17. Other: Trace fluid identified around the spleen. No focal fluid collections identified. Musculoskeletal: No suspicious bone lesions identified. IMPRESSION: 1. There is a large hypoenhancing mass involving the body and tail of pancreasa compatible with pancreatic adenocarcinoma. 2. 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 At least partial encasement of the left side of the splenic artery is suspected. The portal vein, portal venous confluence and SMV do not appear to be involved. The splenic vein appears completely occluded. 3. Multiple rim enhancing lesions are identified within the liver compatible with metastatic disease. 4. Lymph node within the porta hepatic region measures 1.3 cm. Suspicious for nodal metastasis. 5. Subpleural nodule within the right lower lobe measures 7 mm. Consider further evaluation with dedicated CT of the chest. Electronically Signed   By: Signa Kell M.D.   On: 04/04/2023 07:46   MR 3D Recon At Scanner  Result Date: 04/04/2023 CLINICAL DATA:  Evaluate pancreas lesion. EXAM: MRI ABDOMEN WITHOUT AND WITH CONTRAST TECHNIQUE: Multiplanar multisequence MR imaging of the abdomen was performed both before and after the administration  of intravenous contrast. CONTRAST:  8mL GADAVIST GADOBUTROL 1 MMOL/ML IV SOLN COMPARISON:  CT 04/03/2023 FINDINGS: Lower chest: No acute findings. Subpleural nodule within the right lower lobe measures 7 mm, image 11/17. Hepatobiliary: Multiple rim enhancing lesions are identified within the liver. The largest is in segment 4 B measuring 3.4 x 3.1, image 32/17. Lesion within segment 6 measures 1.4 by 1.0 cm, image 54/17. Gallbladder appears within normal limits. No gallstones or gallbladder wall edema. The common bile duct measures 6 mm in maximum diameter which is considered upper limits of normal. Pancreas: There is a large mass involving the body and tail of pancreas which is hypoenhancing measuring 6.4 by 2.2 cm, image 37/19. There is diffuse dilatation of the main duct and side branches of the main duct within  the tail of pancreas compatible with main ductal obstruction. Encasement of the celiac artery and its branches are identified. At least partial encasement of the left side of the splenic artery is suspected, image 40/19. The portal vein, portal venous confluence and SMV do not appear to be involved. The splenic vein appears completely occluded. Spleen:  Within normal limits in size and appearance. Adrenals/Urinary Tract: Normal adrenal glands. No signs of kidney mass or hydronephrosis. Stomach/Bowel: Stomach appears nondistended. No dilated bowel loops identified. Vascular/Lymphatic: Normal appearance of the abdominal aorta. Lymph node within the porta hepatic region measures 1.3 cm, image 30/17. Other: Trace fluid identified around the spleen. No focal fluid collections identified. Musculoskeletal: No suspicious bone lesions identified. IMPRESSION: 1. There is a large hypoenhancing mass involving the body and tail of pancreasa compatible with pancreatic adenocarcinoma. 2. 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 At least partial encasement of the left side of the splenic  artery is suspected. The portal vein, portal venous confluence and SMV do not appear to be involved. The splenic vein appears completely occluded. 3. Multiple rim enhancing lesions are identified within the liver compatible with metastatic disease. 4. Lymph node within the porta hepatic region measures 1.3 cm. Suspicious for nodal metastasis. 5. Subpleural nodule within the right lower lobe measures 7 mm. Consider further evaluation with dedicated CT of the chest. Electronically Signed   By: Signa Kell M.D.   On: 04/04/2023 07:46   CT ABDOMEN PELVIS W CONTRAST  Addendum Date: 04/03/2023   ADDENDUM REPORT: 04/03/2023 22:33 ADDENDUM: Critical Value/emergent results were called by telephone at the time of interpretation on 04/03/2023 at 10:33 pm to provider Riki Sheer , who verbally acknowledged these results. Electronically Signed   By: Thornell Sartorius M.D.   On: 04/03/2023 22:33   Result Date: 04/03/2023 CLINICAL DATA:  Abdominal pain, acute, nonlocalized. EXAM: CT ABDOMEN AND PELVIS WITH CONTRAST TECHNIQUE: Multidetector CT imaging of the abdomen and pelvis was performed using the standard protocol following bolus administration of intravenous contrast. RADIATION DOSE REDUCTION: This exam was performed according to the departmental dose-optimization program which includes automated exposure control, adjustment of the mA and/or kV according to patient size and/or use of iterative reconstruction technique. CONTRAST:  75mL OMNIPAQUE IOHEXOL 300 MG/ML  SOLN COMPARISON:  12/18/2020. FINDINGS: Lower chest: A 6 mm nodule is present in the right lower lobe, axial image 9. A 1 cm nodule is noted in the right lower lobe, axial image 23. Hepatobiliary: A 1.2 cm hypodensity with surrounding enhancement is noted in the right lobe of the liver, segment 5. An ill-defined hypodensity is present in the left lobe of the liver measuring 2.3 cm, hepatic segment 4B. An ill-defined hypodensity is present in the left lobe of the  liver measuring 9 mm, hepatic segment 2. No biliary ductal dilatation. The gallbladder is without stones. Pancreas: An ill-defined hypodense mass is present in the distal pancreas measuring 9.9 x 2.9 cm. There is mild pancreatic ductal dilatation at the head of the pancreas measuring 4 mm. Mild fat stranding is noted about the tail of the pancreas. Spleen: Normal in size without focal abnormality. Adrenals/Urinary Tract: No adrenal nodule or mass. The kidneys enhance symmetrically. No renal calculus or hydronephrosis. 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 Stomach/Bowel: Stomach is partially distended. Appendix appears normal. No evidence of bowel wall thickening, distention, or inflammatory changes. No bowel obstruction, free air, or pneumatosis. Vascular/Lymphatic: Aortic atherosclerosis.  Prominent lymph nodes are present in the gastrohepatic ligament. The portal vein and superior mesenteric vein are patent. Splenic vein is not definitely seen on exam and may be occluded. Reproductive: Status post hysterectomy. No adnexal masses. Other: No abdominopelvic ascites. Musculoskeletal: Degenerative changes are present in the thoracolumbar spine. No acute or suspicious osseous abnormality. IMPRESSION: 1. Hypoenhancing masslike lesion in the tail of the pancreas measuring 9.9 x 2.9 cm. Mild surrounding fat stranding is noted. Differential diagnosis includes pancreatic adenocarcinoma versus pancreatitis. Correlation with amylase, lipase, and CA 19-9 is recommended. 2. The portal vein and superior mesenteric vein are patent. The splenic vein is not visualized on exam and may be occluded. 3. 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 4. Scattered ill-defined hypodensities in the liver new from the previous exam in the possibility of underlying metastatic disease can not be excluded. MRI with  contrast is recommended for further evaluation. 5. Aortic atherosclerosis. 6. Stable right lower lobe pulmonary nodules measuring up to 1.0 cm. Consider one of the following in 3 months for both low-risk and high-risk individuals: (a) repeat chest CT, (b) follow-up PET-CT, or (c) tissue sampling. This recommendation follows the consensus statement: Guidelines for Management of Incidental Pulmonary Nodules Detected on CT Images: From the Fleischner Society 2017; Radiology 2017; 284:228-243. 7. Remaining incidental findings as described above. Electronically Signed: By: Thornell Sartorius M.D. On: 04/03/2023 21:57       LOS: 0 days   Rogan Wigley Rito Ehrlich  [facility] Pager on [email or web address]  04/04/2023, 9:29 AM

## 2023-04-04 NOTE — Progress Notes (Signed)
ANTICOAGULATION CONSULT NOTE  Pharmacy Consult for warfarin Indication: atrial fibrillation  Allergies  Allergen Reactions   Norco [Hydrocodone-Acetaminophen] Itching and Other (See Comments)    Tolerates Oxycodone    Patient Measurements: Height: 5\' 11"  (180.3 cm) Weight: 74.2 kg (163 lb 9.3 oz) IBW/kg (Calculated) : 70.8  Vital Signs: Temp: 97.8 F (36.6 C) (04/29 0542) Temp Source: Oral (04/29 0542) BP: 151/115 (04/29 0542) Pulse Rate: 98 (04/29 0542)  Labs: Recent Labs    04/03/23 1903 04/03/23 2310 04/04/23 0746  HGB 15.4*  --  14.3  HCT 45.3  --  41.9  PLT 147*  --  122*  LABPROT  --  35.4* 34.4*  INR  --  3.6* 3.4*  CREATININE 1.31*  --  0.94     Estimated Creatinine Clearance: 68.5 mL/min (by C-G formula based on SCr of 0.94 mg/dL).   Medical History: Past Medical History:  Diagnosis Date   Acute pericardial effusion 05/17/2022   AKI (acute kidney injury) (HCC) 05/17/2022   Anxiety    Arthritis    Cervicalgia    CHF (congestive heart failure) (HCC)    Chondromalacia of patella    Chronic neck pain    Chronic pain syndrome    Depression    secondary to loss of her son at age 85   Diabetic neuropathy (HCC)    DMII (diabetes mellitus, type 2) (HCC)    Dysrhythmia    Enthesopathy of hip region    Fibromyalgia    GERD (gastroesophageal reflux disease)    Hep C w/o coma, chronic (HCC)    Hepatitis C    diagnosed 2005   Hypertension    Insomnia    Low back pain    Lumbago    Mitral regurgitation    Obesity    Pain in joint, upper arm    Primary localized osteoarthrosis, lower leg    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   Substance abuse (HCC)    sober since 2001   Tobacco abuse    Tricuspid regurgitation    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 INR elevated on admission. Per anticoagulation clinic notes - she is taking warfarin 5 mg daily except 2.5 mg on  Tuesdays. Per medication history, patient reports taking 2.5 mg on Sundays instead but otherwise same weekly dose.  She is admitted with abdominal pain and cystitis currently on Rocephin (potential to increase INR).   Last dose of warfarin PTA 4/27.  Goal of Therapy:  INR 2-3   Plan:  -Warfarin skipped 4/28 s/t elevated INR, which has now decreased slightly -No reports of bleeding or complications of warfarin - give reduced dose of warfarin 2.5 mg (half home dose) today -Follow up INR tomorrow morning -Continue to monitor for new bleeding  [name], PharmD, BCPS Clinical Pharmacist 04/04/2023 10:09 AM

## 2023-04-04 NOTE — Progress Notes (Signed)
ANTICOAGULATION CONSULT NOTE - Initial Consult  Pharmacy Consult for Coumadin Indication: atrial fibrillation  Allergies  Allergen Reactions   Norco [Hydrocodone-Acetaminophen] Itching and Other (See Comments)    Tolerates Oxycodone    Patient Measurements: Height: 5\' 11"  (180.3 cm) Weight: 79.4 kg (175 lb) IBW/kg (Calculated) : 70.8  Vital Signs: Temp: 97.8 F (36.6 C) (04/29 0005) Temp Source: Oral (04/29 0005) BP: 144/100 (04/29 0005) Pulse Rate: 92 (04/29 0005)  Labs: Recent Labs    04/03/23 1903 04/03/23 2310  HGB 15.4*  --   HCT 45.3  --   PLT 147*  --   LABPROT  --  35.4*  INR  --  3.6*  CREATININE 1.31*  --     Estimated Creatinine Clearance: 49.1 mL/min (A) (by C-G formula based on SCr of 1.31 mg/dL (H)).   Medical History: Past Medical History:  Diagnosis Date   Acute pericardial effusion 05/17/2022   AKI (acute kidney injury) (HCC) 05/17/2022   Anxiety    Arthritis    Cervicalgia    CHF (congestive heart failure) (HCC)    Chondromalacia of patella    Chronic neck pain    Chronic pain syndrome    Depression    secondary to loss of her son at age 30   Diabetic neuropathy (HCC)    DMII (diabetes mellitus, type 2) (HCC)    Dysrhythmia    Enthesopathy of hip region    Fibromyalgia    GERD (gastroesophageal reflux disease)    Hep C w/o coma, chronic (HCC)    Hepatitis C    diagnosed 2005   Hypertension    Insomnia    Low back pain    Lumbago    Mitral regurgitation    Obesity    Pain in joint, upper arm    Primary localized osteoarthrosis, lower leg    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       albuterol (PROVENTIL HFA;VENTOLIN HFA) 108 (90 Base) MCG/ACT inhaler Inhale 2 puffs into the lungs every 6 (six) hours as needed for wheezing or shortness of breath.       ARIPiprazole (ABILIFY) 5 MG tablet Take 2.5 mg by mouth daily.      atorvastatin (LIPITOR) 20 MG tablet Take 20 mg by mouth daily.      baclofen (LIORESAL) 10 MG tablet Take by mouth 2 (two) times daily.      buPROPion (WELLBUTRIN XL) 300 MG 24 hr tablet Take 300 mg by mouth in the morning.      calcium carbonate (OSCAL) 1500 (600 Ca) MG TABS tablet Take 1,500 mg by mouth daily with breakfast.      cycloSPORINE (RESTASIS) 0.05 % ophthalmic emulsion Apply to eye as needed (dry eyes).      dexlansoprazole (DEXILANT) 60 MG capsule Take 1 capsule by mouth daily.      diclofenac Sodium (VOLTAREN) 1 % GEL Apply topically as needed (pain).      diltiazem (CARDIZEM CD) 300 MG 24 hr capsule Take 300 mg by mouth daily.      FARXIGA 10 MG TABS tablet Take 10 mg  by mouth daily.      FLUoxetine (PROZAC) 20 MG capsule Take 20 mg by mouth daily.      furosemide (LASIX) 40 MG tablet Take 1 tablet (40 mg total) by mouth daily. 90 tablet 3    gabapentin (NEURONTIN) 300 MG capsule Take 300 mg by mouth 3 (three) times daily.      hydrOXYzine (ATARAX) 25 MG tablet Take by mouth daily at 6 (six) AM.      linagliptin (TRADJENTA) 5 MG TABS tablet Take 5 mg by mouth daily.      losartan (COZAAR) 25 MG tablet Take 1 tablet (25 mg total) by mouth daily. 30 tablet 0    lubiprostone (AMITIZA) 24 MCG capsule Take 24 mcg by mouth 2 (two) times daily with a meal.      magnesium oxide (MAG-OX) 400 MG tablet Take by mouth 2 (two) times daily.      metoprolol tartrate (LOPRESSOR) 25 MG tablet Take 0.5 tablets (12.5 mg total) by mouth 2 (two) times daily. 90 tablet 3    naloxone (NARCAN) nasal spray 4 mg/0.1 mL Place 1 spray into the nose as needed (accidental overdose).      neomycin-polymyxin b-dexamethasone (MAXITROL) 3.5-10000-0.1 SUSP Place 1 drop into  both eyes as needed (itching).      omeprazole (PRILOSEC) 10 MG capsule Take 10 mg by mouth daily.      oxyCODONE (ROXICODONE) 15 MG immediate release tablet Take 15 mg by mouth every 4 (four) hours as needed for pain.      potassium chloride SA (KLOR-CON M) 20 MEQ tablet Take one tablet by mouth twice a day for one day, then take one tablet by mouth once daily thereafter. 30 tablet 3    PREMARIN 0.45 MG tablet Take 0.45 mg by mouth daily.      traZODone (DESYREL) 100 MG tablet Take 100 mg by mouth at bedtime. Pt takes 2 tablets 200 mg at bedtime.      TRINTELLIX 10 MG TABS tablet Take 10 mg by mouth daily.      warfarin (COUMADIN) 5 MG tablet TAKE 1 TABLET BY MOUTH DAILY EXCEPT 1.5 TABLETS BY MOUTH ON SUNDAYS AS DIRECTED BY COUMADIN CLINIC 105 tablet 1     Assessment: 62 yo F on Coumadin PTA for hx Afib.  INR elevated on admission.  Per Coumadin clinic notes- she is taking Coumadin 5mg  daily except 2.5mg  on Tuesdays.  This was decreased by 2.5mg /week in March and visit at April check was therapeutic.  She is admitted with abdominal pain and cystitis currently on Rocephin which can interact with warfarin to increase INR.  Baseline CBC- Hg slightly elevated at 15.4; pltc slightly decreased at 147.   CKD noted- Scr 1.31  Goal of Therapy:  INR 2-3   Plan:  Resume Coumadin when INR<3 Daily INR  Junita Push PharmD 04/04/2023,4:30 AM

## 2023-04-04 NOTE — TOC Progression Note (Signed)
Transition of Care [facility]) - Progression Note    Patient Details  Name: [name] MRN: [id number] Date of Birth: 1961-06-30  Transition of Care [facility]) CM/SW Contact  Beckie Busing, RN Phone Number:[phone]  04/04/2023, 3:39 PM  Clinical Narrative:     Transition of Care (TOC) Screening Note   Patient Details  Name: [name] Date of Birth: 1961/07/08   Transition of Care [facility]) CM/SW Contact:    Beckie Busing, RN Phone Number: 04/04/2023, 3:39 PM    Transition of Care Department [facility]) has reviewed patient and no TOC needs have been identified at this time. We will continue to monitor patient advancement through interdisciplinary progression rounds. If new patient transition needs arise, please place a TOC consult.          Expected Discharge Plan and Services                                               Social Determinants of Health (SDOH) Interventions SDOH Screenings   Food Insecurity: No Food Insecurity (04/04/2023)  Housing: Low Risk  (04/04/2023)  Transportation Needs: No Transportation Needs (04/04/2023)  Utilities: At Risk (04/04/2023)  Tobacco Use: High Risk (04/03/2023)    Readmission Risk Interventions    08/06/2022    2:13 PM  Readmission Risk Prevention Plan  Transportation Screening Complete  Medication Review (RN Care Manager) Complete  PCP or Specialist appointment within 3-5 days of discharge Complete  HRI or Home Care Consult Complete  SW Recovery Care/Counseling Consult Complete  Palliative Care Screening Not Applicable  Skilled Nursing Facility Not Applicable

## 2023-04-05 ENCOUNTER — Ambulatory Visit: Payer: 59

## 2023-04-05 ENCOUNTER — Encounter (HOSPITAL_COMMUNITY): Payer: Self-pay | Admitting: Internal Medicine

## 2023-04-05 ENCOUNTER — Inpatient Hospital Stay (HOSPITAL_COMMUNITY): Payer: 59

## 2023-04-05 DIAGNOSIS — E118 Type 2 diabetes mellitus with unspecified complications: Secondary | ICD-10-CM | POA: Diagnosis not present

## 2023-04-05 DIAGNOSIS — E871 Hypo-osmolality and hyponatremia: Secondary | ICD-10-CM | POA: Diagnosis not present

## 2023-04-05 DIAGNOSIS — R300 Dysuria: Secondary | ICD-10-CM | POA: Diagnosis not present

## 2023-04-05 DIAGNOSIS — I480 Paroxysmal atrial fibrillation: Secondary | ICD-10-CM | POA: Diagnosis not present

## 2023-04-05 DIAGNOSIS — K8689 Other specified diseases of pancreas: Secondary | ICD-10-CM | POA: Diagnosis not present

## 2023-04-05 DIAGNOSIS — N390 Urinary tract infection, site not specified: Secondary | ICD-10-CM | POA: Diagnosis not present

## 2023-04-05 LAB — PROTIME-INR
INR: 1.3 — ABNORMAL HIGH (ref 0.8–1.2)
Prothrombin Time: 16.1 seconds — ABNORMAL HIGH (ref 11.4–15.2)

## 2023-04-05 LAB — URINE CULTURE

## 2023-04-05 LAB — CANCER ANTIGEN 19-9: CA 19-9: 77 U/mL — ABNORMAL HIGH (ref 0–35)

## 2023-04-05 LAB — GLUCOSE, CAPILLARY
Glucose-Capillary: 340 mg/dL — ABNORMAL HIGH (ref 70–99)
Glucose-Capillary: 399 mg/dL — ABNORMAL HIGH (ref 70–99)
Glucose-Capillary: 270 mg/dL — ABNORMAL HIGH (ref 70–99)
Glucose-Capillary: 445 mg/dL — ABNORMAL HIGH (ref 70–99)

## 2023-04-05 MED ORDER — NICOTINE 7 MG/24HR TD PT24
7.0000 mg | MEDICATED_PATCH | Freq: Every day | TRANSDERMAL | Status: AC
  Administered 2023-04-05: 7 mg via TRANSDERMAL
  Filled 2023-04-05: qty 1

## 2023-04-05 MED ORDER — HEPARIN SODIUM (PORCINE) 5000 UNIT/ML IJ SOLN
5000.0000 [IU] | Freq: Three times a day (TID) | INTRAMUSCULAR | Status: AC
  Administered 2023-04-05 – 2023-04-06 (×5): 5000 [IU] via SUBCUTANEOUS
  Filled 2023-04-05 (×5): qty 1

## 2023-04-05 MED ORDER — INSULIN ASPART 100 UNIT/ML IJ SOLN
1.0000 [IU] | Freq: Once | INTRAMUSCULAR | Status: AC
  Administered 2023-04-05: 1 [IU] via SUBCUTANEOUS

## 2023-04-05 MED ORDER — HYDROXYZINE HCL 10 MG PO TABS
10.0000 mg | ORAL_TABLET | Freq: Three times a day (TID) | ORAL | Status: DC | PRN
  Administered 2023-04-05: 10 mg via ORAL
  Filled 2023-04-05 (×2): qty 1

## 2023-04-05 MED ORDER — SODIUM CHLORIDE (PF) 0.9 % IJ SOLN
INTRAMUSCULAR | Status: AC
  Filled 2023-04-05: qty 50

## 2023-04-05 MED ORDER — IOHEXOL 300 MG/ML  SOLN
75.0000 mL | Freq: Once | INTRAMUSCULAR | Status: AC | PRN
  Administered 2023-04-05: 75 mL via INTRAVENOUS

## 2023-04-05 MED ORDER — INSULIN GLARGINE-YFGN 100 UNIT/ML ~~LOC~~ SOLN
10.0000 [IU] | Freq: Every day | SUBCUTANEOUS | Status: DC
  Administered 2023-04-05 – 2023-04-06 (×2): 10 [IU] via SUBCUTANEOUS
  Filled 2023-04-05 (×2): qty 0.1

## 2023-04-05 NOTE — Progress Notes (Addendum)
[facility] PROGRESS NOTE   Keiley Levey EAV:[id number] DOB: 27-Feb-1961 DOA: 04/03/2023  PCP: Chrys Racer, MD  Brief History/Interval Summary: 62 y.o. female with medical history significant for rheumatic heart disease s/p MV and TV repair 04/2022, history of pericardial effusion/tamponade s/p pericardiocentesis 05/2022, PAF on Coumadin, CKD stage IIIa, COPD, T2DM, HTN, tobacco use who presented to the ED for evaluation of abdominal pain and dysuria.  Was noted to have abnormal UA suggesting urinary tract infection.  CT scan however also showed a possible mass in the pancreatic tail.  She was hospitalized for further management.  Consultants: Gastroenterology  Procedures: None yet    Subjective/Interval History: Continues to have 7 out of 10 pain in the upper abdomen but more comfortable compared to yesterday.  Denies any nausea vomiting.  She is aware of the plan for biopsy on Thursday.      Assessment/Plan:  Mass in the pancreatic tail with hypodensities in the liver 9.9 x 2.9 cm masslike lesion in the tail of the pancreas seen on CT imaging.  Findings concerning for pancreatic adenocarcinoma.  Pancreatitis also on differential.  However lipase is only 63.  LFTs within normal limits.  Patient does report unintentional weight loss of 120 pounds over the last year. Patient underwent MRI this morning.  Large hypoenhancing mass involving the body and tail of pancreas was noted.  Compatible with pancreatic adenocarcinoma.  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  Multiple rim-enhancing lesions also noted in the liver compatible with metastatic disease. Suspicious appearing lymph nodes also noted. Pulmonary nodules noted.  Will do a CT chest as well. Patient will need tissue diagnosis.  Discussed with gastroenterology.  Plan is for EUS and biopsy on 5/2.  Coumadin is on hold.  Vitamin K was given.  INR is now less than 2. CA 19-9 is 77.    Message sent to oncology to arrange outpatient consultation after biopsy.  Acute cystitis UA was abnormal.  Patient was experiencing dysuria and urinary frequency.   Urine culture growing gram-negative rods.  Wait on final identification and sensitivities. Continue ceftriaxone for now.    Paroxysmal atrial fibrillation Continue Cardizem.  Also on Coumadin for anticoagulation.  For her to undergo biopsy, Coumadin had to be held.  History of rheumatic heart disease status post mitral valve and tricuspid valve repair in May 2023/chronic diastolic CHF EF is 60 to 65%. Followed by [facility] cardiology. Discussed with Dr. Anne Fu yesterday regarding anticoagulation.  He was okay with reversing the Coumadin for her to undergo procedure. She is noted to be on furosemide, diltiazem, metoprolol which are being continued. Monitor electrolytes.  Chronic kidney disease stage IIIa/hyponatremia Stable.  Monitor urine output.  Avoid nephrotoxic agents. Monitor labs periodically.  Diabetes mellitus type 2, uncontrolled with hyperglycemia Currently on SSI.  Patient was on Farxiga and linagliptin prior to admission. HbA1c 8.7. CBGs are poorly controlled.  Could be related to the pancreatic tumor.  Will add basal insulin.  History of COPD Stable.  Essential hypertension Monitor blood pressures closely.  Blood pressure is reasonably well-controlled.  Hyperlipidemia Continue atorvastatin.  History of depression and anxiety Patient has had recent adjustments to her medications due to depressed mood lately. Noted to be on Abilify, Wellbutrin, Prozac, gabapentin, Seroquel, trazodone and Trintellix prior to admission.  These have been resumed.  Right-sided pulmonary nodules These were seen on CT angiogram chest done in June 2023.  Noted to be stable as reported on CT abdomen.  Will do  a dedicated CT chest for staging purposes.   DVT Prophylaxis: Coumadin is on hold.  Will initiate subcutaneous heparin  for now. Code Status: Full code Family Communication: Discussed with patient Disposition Plan: To be determined       Medications: Scheduled:  ARIPiprazole  2.5 mg Oral Daily   atorvastatin  20 mg Oral Daily   buPROPion  300 mg Oral q AM   diltiazem  300 mg Oral Daily   FLUoxetine  20 mg Oral Daily   furosemide  40 mg Oral Daily   gabapentin  300 mg Oral TID   insulin aspart  0-5 Units Subcutaneous QHS   insulin aspart  0-9 Units Subcutaneous TID WC   lubiprostone  24 mcg Oral BID WC   metoprolol tartrate  12.5 mg Oral BID   nicotine  7 mg Transdermal Daily   pantoprazole  40 mg Oral Daily   sodium chloride flush  3 mL Intravenous Q12H   traZODone  200 mg Oral QHS   vortioxetine HBr  10 mg Oral Daily   Continuous:  cefTRIAXone (ROCEPHIN)  IV Stopped (04/04/23 2258)   ZOX:[id number], HYDROmorphone (DILAUDID) injection, oxyCODONE, prochlorperazine, tiZANidine  Antibiotics: Anti-infectives (From admission, onward)    Start     Dose/Rate Route Frequency Ordered Stop   04/04/23 2200  cefTRIAXone (ROCEPHIN) 1 g in sodium chloride 0.9 % 100 mL IVPB        1 g 200 mL/hr over 30 Minutes Intravenous Every 24 hours 04/03/23 2345     04/03/23 2230  cefTRIAXone (ROCEPHIN) 1 g in sodium chloride 0.9 % 100 mL IVPB        1 g 200 mL/hr over 30 Minutes Intravenous  Once 04/03/23 2223 04/03/23 2340       Objective:  Vital Signs  Vitals:   04/04/23 0800 04/04/23 1318 04/04/23 2200 04/05/23 0616  BP: (!) 172/107 (!) 136/96 120/81 (!) 127/100  Pulse: 64 82 84 (!) 109  Resp: 16 16 18 18   Temp: 98.1 F (36.7 C) 98.2 F (36.8 C) 98.4 F (36.9 C) 98.1 F (36.7 C)  TempSrc: Oral Oral Oral Oral  SpO2:  96% 97% 96%  Weight:      Height:        Intake/Output Summary (Last 24 hours) at 04/05/2023 0934 Last data filed at 04/05/2023 0932 Gross per 24 hour  Intake 460 ml  Output --  Net 460 ml    Filed Weights   04/03/23 1629 04/04/23 0542  Weight: 79.4 kg 74.2 kg     General appearance: Awake alert.  In no distress Resp: Clear to auscultation bilaterally.  Normal effort Cardio: S1-S2 is normal regular.  No S3-S4.  No rubs murmurs or bruit GI: Abdomen is soft.  Tender in the epigastric area without any rebound rigidity or guarding. Extremities: No edema.  Full range of motion of lower extremities. Neurologic: Alert and oriented x3.  No focal neurological deficits.    Lab Results:  Data Reviewed: I have personally reviewed following labs and reports of the imaging studies  CBC: Recent Labs  Lab 04/03/23 1903 04/04/23 0746  WBC 8.2 6.3  NEUTROABS 5.6  --   HGB 15.4* 14.3  HCT 45.3 41.9  MCV 86.3 86.9  PLT 147* 122*     Basic Metabolic Panel: Recent Labs  Lab 04/03/23 1903 04/04/23 0746  NA 130* 131*  K 3.9 3.7  CL 97* 101  CO2 20* 20*  GLUCOSE 305* 297*  BUN 34* 24*  CREATININE  1.31* 0.94  CALCIUM 8.7* 8.2*     GFR: Estimated Creatinine Clearance: 68.5 mL/min (by C-G formula based on SCr of 0.94 mg/dL).  Liver Function Tests: Recent Labs  Lab 04/03/23 1903 04/04/23 0746  AST 15 13*  ALT 19 17  ALKPHOS 68 63  BILITOT 0.5 0.7  PROT 7.5 6.4*  ALBUMIN 4.3 3.9     Recent Labs  Lab 04/03/23 1903  LIPASE 63*    Coagulation Profile: Recent Labs  Lab 04/03/23 2310 04/04/23 0746 04/05/23 0121  INR 3.6* 3.4* 1.3*      CBG: Recent Labs  Lab 04/04/23 0742 04/04/23 1230 04/04/23 1539 04/04/23 2223 04/05/23 0746  GLUCAP 295* 335* 247* 309* 340*      Radiology Studies: MR ABDOMEN W WO CONTRAST  Result Date: 04/04/2023 CLINICAL DATA:  Evaluate pancreas lesion. EXAM: MRI ABDOMEN WITHOUT AND WITH CONTRAST TECHNIQUE: Multiplanar multisequence MR imaging of the abdomen was performed both before and after the administration of intravenous contrast. CONTRAST:  8mL GADAVIST GADOBUTROL 1 MMOL/ML IV SOLN COMPARISON:  CT 04/03/2023 FINDINGS: Lower chest: No acute findings. Subpleural nodule within the right  lower lobe measures 7 mm, image 11/17. Hepatobiliary: Multiple rim enhancing lesions are identified within the liver. The largest is in segment 4 B measuring 3.4 x 3.1, image 32/17. Lesion within segment 6 measures 1.4 by 1.0 cm, image 54/17. Gallbladder appears within normal limits. No gallstones or gallbladder wall edema. The common bile duct measures 6 mm in maximum diameter which is considered upper limits of normal. Pancreas: There is a large mass involving the body and tail of pancreas which is hypoenhancing measuring 6.4 by 2.2 cm, image 37/19. There is diffuse dilatation of the main duct and side branches of the main duct within the tail of pancreas compatible with main ductal obstruction. Encasement of the celiac artery and its branches are identified. At least partial encasement of the left side of the splenic artery is suspected, image 40/19. The portal vein, portal venous confluence and SMV do not appear to be involved. The splenic vein appears completely occluded. Spleen:  Within normal limits in size and appearance. Adrenals/Urinary Tract: Normal adrenal glands. No signs of kidney mass or hydronephrosis. Stomach/Bowel: Stomach appears nondistended. No dilated bowel loops identified. Vascular/Lymphatic: Normal appearance of the abdominal aorta. Lymph node within the porta hepatic region measures 1.3 cm, image 30/17. Other: Trace fluid identified around the spleen. No focal fluid collections identified. Musculoskeletal: No suspicious bone lesions identified. IMPRESSION: 1. There is a large hypoenhancing mass involving the body and tail of pancreasa compatible with pancreatic adenocarcinoma. 2. 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 At least partial encasement of the left side of the splenic artery is suspected. The portal vein, portal venous confluence and SMV do not appear to be involved. The splenic vein appears completely occluded. 3. Multiple rim enhancing lesions are  identified within the liver compatible with metastatic disease. 4. Lymph node within the porta hepatic region measures 1.3 cm. Suspicious for nodal metastasis. 5. Subpleural nodule within the right lower lobe measures 7 mm. Consider further evaluation with dedicated CT of the chest. Electronically Signed   By: Signa Kell M.D.   On: 04/04/2023 07:46   MR 3D Recon At Scanner  Result Date: 04/04/2023 CLINICAL DATA:  Evaluate pancreas lesion. EXAM: MRI ABDOMEN WITHOUT AND WITH CONTRAST TECHNIQUE: Multiplanar multisequence MR imaging of the abdomen was performed both before and after the administration of intravenous contrast. CONTRAST:  8mL GADAVIST  GADOBUTROL 1 MMOL/ML IV SOLN COMPARISON:  CT 04/03/2023 FINDINGS: Lower chest: No acute findings. Subpleural nodule within the right lower lobe measures 7 mm, image 11/17. Hepatobiliary: Multiple rim enhancing lesions are identified within the liver. The largest is in segment 4 B measuring 3.4 x 3.1, image 32/17. Lesion within segment 6 measures 1.4 by 1.0 cm, image 54/17. Gallbladder appears within normal limits. No gallstones or gallbladder wall edema. The common bile duct measures 6 mm in maximum diameter which is considered upper limits of normal. Pancreas: There is a large mass involving the body and tail of pancreas which is hypoenhancing measuring 6.4 by 2.2 cm, image 37/19. There is diffuse dilatation of the main duct and side branches of the main duct within the tail of pancreas compatible with main ductal obstruction. Encasement of the celiac artery and its branches are identified. At least partial encasement of the left side of the splenic artery is suspected, image 40/19. The portal vein, portal venous confluence and SMV do not appear to be involved. The splenic vein appears completely occluded. Spleen:  Within normal limits in size and appearance. Adrenals/Urinary Tract: Normal adrenal glands. No signs of kidney mass or hydronephrosis. Stomach/Bowel:  Stomach appears nondistended. No dilated bowel loops identified. Vascular/Lymphatic: Normal appearance of the abdominal aorta. Lymph node within the porta hepatic region measures 1.3 cm, image 30/17. Other: Trace fluid identified around the spleen. No focal fluid collections identified. Musculoskeletal: No suspicious bone lesions identified. IMPRESSION: 1. There is a large hypoenhancing mass involving the body and tail of pancreasa compatible with pancreatic adenocarcinoma. 2. 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 At least partial encasement of the left side of the splenic artery is suspected. The portal vein, portal venous confluence and SMV do not appear to be involved. The splenic vein appears completely occluded. 3. Multiple rim enhancing lesions are identified within the liver compatible with metastatic disease. 4. Lymph node within the porta hepatic region measures 1.3 cm. Suspicious for nodal metastasis. 5. Subpleural nodule within the right lower lobe measures 7 mm. Consider further evaluation with dedicated CT of the chest. Electronically Signed   By: Signa Kell M.D.   On: 04/04/2023 07:46   CT ABDOMEN PELVIS W CONTRAST  Addendum Date: 04/03/2023   ADDENDUM REPORT: 04/03/2023 22:33 ADDENDUM: Critical Value/emergent results were called by telephone at the time of interpretation on 04/03/2023 at 10:33 pm to provider Riki Sheer , who verbally acknowledged these results. Electronically Signed   By: Thornell Sartorius M.D.   On: 04/03/2023 22:33   Result Date: 04/03/2023 CLINICAL DATA:  Abdominal pain, acute, nonlocalized. EXAM: CT ABDOMEN AND PELVIS WITH CONTRAST TECHNIQUE: Multidetector CT imaging of the abdomen and pelvis was performed using the standard protocol following bolus administration of intravenous contrast. RADIATION DOSE REDUCTION: This exam was performed according to the departmental dose-optimization program which includes automated exposure control,  adjustment of the mA and/or kV according to patient size and/or use of iterative reconstruction technique. CONTRAST:  75mL OMNIPAQUE IOHEXOL 300 MG/ML  SOLN COMPARISON:  12/18/2020. FINDINGS: Lower chest: A 6 mm nodule is present in the right lower lobe, axial image 9. A 1 cm nodule is noted in the right lower lobe, axial image 23. Hepatobiliary: A 1.2 cm hypodensity with surrounding enhancement is noted in the right lobe of the liver, segment 5. An ill-defined hypodensity is present in the left lobe of the liver measuring 2.3 cm, hepatic segment 4B. An ill-defined hypodensity is present in the  left lobe of the liver measuring 9 mm, hepatic segment 2. No biliary ductal dilatation. The gallbladder is without stones. Pancreas: An ill-defined hypodense mass is present in the distal pancreas measuring 9.9 x 2.9 cm. There is mild pancreatic ductal dilatation at the head of the pancreas measuring 4 mm. Mild fat stranding is noted about the tail of the pancreas. Spleen: Normal in size without focal abnormality. Adrenals/Urinary Tract: No adrenal nodule or mass. The kidneys enhance symmetrically. No renal calculus or hydronephrosis. 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 Stomach/Bowel: Stomach is partially distended. Appendix appears normal. No evidence of bowel wall thickening, distention, or inflammatory changes. No bowel obstruction, free air, or pneumatosis. Vascular/Lymphatic: Aortic atherosclerosis. Prominent lymph nodes are present in the gastrohepatic ligament. The portal vein and superior mesenteric vein are patent. Splenic vein is not definitely seen on exam and may be occluded. Reproductive: Status post hysterectomy. No adnexal masses. Other: No abdominopelvic ascites. Musculoskeletal: Degenerative changes are present in the thoracolumbar spine. No acute or suspicious osseous abnormality. IMPRESSION: 1. Hypoenhancing masslike lesion in the tail of  the pancreas measuring 9.9 x 2.9 cm. Mild surrounding fat stranding is noted. Differential diagnosis includes pancreatic adenocarcinoma versus pancreatitis. Correlation with amylase, lipase, and CA 19-9 is recommended. 2. The portal vein and superior mesenteric vein are patent. The splenic vein is not visualized on exam and may be occluded. 3. 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 4. Scattered ill-defined hypodensities in the liver new from the previous exam in the possibility of underlying metastatic disease can not be excluded. MRI with contrast is recommended for further evaluation. 5. Aortic atherosclerosis. 6. Stable right lower lobe pulmonary nodules measuring up to 1.0 cm. Consider one of the following in 3 months for both low-risk and high-risk individuals: (a) repeat chest CT, (b) follow-up PET-CT, or (c) tissue sampling. This recommendation follows the consensus statement: Guidelines for Management of Incidental Pulmonary Nodules Detected on CT Images: From the Fleischner Society 2017; Radiology 2017; 284:228-243. 7. Remaining incidental findings as described above. Electronically Signed: By: Thornell Sartorius M.D. On: 04/03/2023 21:57       LOS: 1 day   Osvaldo Shipper  [facility] Pager on [email or web address]  04/05/2023, 9:34 AM

## 2023-04-05 NOTE — Progress Notes (Addendum)
[facility] Progress Note  CC:  Pancreatic mass  Subjective:  Tolerating a regular diet.  Pain slightly better with use of K-pad.  No nausea or vomiting.  No BM but passing flatus.  Family at bedside.  Objective:  Vital signs in last 24 hours: Temp:  [98.1 F (36.7 C)-98.4 F (36.9 C)] 98.1 F (36.7 C) (04/30 0616) Pulse Rate:  [82-109] 109 (04/30 0616) Resp:  [16-18] 18 (04/30 0616) BP: (120-136)/(81-100) 127/100 (04/30 0616) SpO2:  [96 %-97 %] 96 % (04/30 0616) Last BM Date : 04/03/23 General:  Alert, Well-developed, in NAD Heart:  Regular rate and rhythm; no murmurs Pulm:  CTAB. No W/R/R. Abdomen:  Soft, non-distended.  BS present.  Upper abdominal TTP. Extremities:  Without edema. Neurologic:  Alert and oriented x 4;  grossly normal neurologically. Psych:  Alert and cooperative. Normal mood and affect.  Intake/Output from previous day: 04/29 0701 - 04/30 0700 In: 340 [P.O.:240; IV Piggyback:100] Out: -  Intake/Output this shift: Total I/O In: 240 [P.O.:240] Out: -   Lab Results: Recent Labs    04/03/23 1903 04/04/23 0746  WBC 8.2 6.3  HGB 15.4* 14.3  HCT 45.3 41.9  PLT 147* 122*   BMET Recent Labs    04/03/23 1903 04/04/23 0746  NA 130* 131*  K 3.9 3.7  CL 97* 101  CO2 20* 20*  GLUCOSE 305* 297*  BUN 34* 24*  CREATININE 1.31* 0.94  CALCIUM 8.7* 8.2*   LFT Recent Labs    04/04/23 0746  PROT 6.4*  ALBUMIN 3.9  AST 13*  ALT 17  ALKPHOS 63  BILITOT 0.7   PT/INR Recent Labs    04/04/23 0746 04/05/23 0121  LABPROT 34.4* 16.1*  INR 3.4* 1.3*   MR ABDOMEN W WO CONTRAST  Result Date: 04/04/2023 CLINICAL DATA:  Evaluate pancreas lesion. EXAM: MRI ABDOMEN WITHOUT AND WITH CONTRAST TECHNIQUE: Multiplanar multisequence MR imaging of the abdomen was performed both before and after the administration of intravenous contrast. CONTRAST:  8mL GADAVIST GADOBUTROL 1 MMOL/ML IV SOLN COMPARISON:  CT 04/03/2023 FINDINGS: Lower chest: No  acute findings. Subpleural nodule within the right lower lobe measures 7 mm, image 11/17. Hepatobiliary: Multiple rim enhancing lesions are identified within the liver. The largest is in segment 4 B measuring 3.4 x 3.1, image 32/17. Lesion within segment 6 measures 1.4 by 1.0 cm, image 54/17. Gallbladder appears within normal limits. No gallstones or gallbladder wall edema. The common bile duct measures 6 mm in maximum diameter which is considered upper limits of normal. Pancreas: There is a large mass involving the body and tail of pancreas which is hypoenhancing measuring 6.4 by 2.2 cm, image 37/19. There is diffuse dilatation of the main duct and side branches of the main duct within the tail of pancreas compatible with main ductal obstruction. Encasement of the celiac artery and its branches are identified. At least partial encasement of the left side of the splenic artery is suspected, image 40/19. The portal vein, portal venous confluence and SMV do not appear to be involved. The splenic vein appears completely occluded. Spleen:  Within normal limits in size and appearance. Adrenals/Urinary Tract: Normal adrenal glands. No signs of kidney mass or hydronephrosis. Stomach/Bowel: Stomach appears nondistended. No dilated bowel loops identified. Vascular/Lymphatic: Normal appearance of the abdominal aorta. Lymph node within the porta hepatic region measures 1.3 cm, image 30/17. Other: Trace fluid identified around the spleen. No focal fluid collections identified. Musculoskeletal: No suspicious bone lesions identified. IMPRESSION:  1. There is a large hypoenhancing mass involving the body and tail of pancreasa compatible with pancreatic adenocarcinoma. 2. 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 At least partial encasement of the left side of the splenic artery is suspected. The portal vein, portal venous confluence and SMV do not appear to be involved. The splenic vein appears  completely occluded. 3. Multiple rim enhancing lesions are identified within the liver compatible with metastatic disease. 4. Lymph node within the porta hepatic region measures 1.3 cm. Suspicious for nodal metastasis. 5. Subpleural nodule within the right lower lobe measures 7 mm. Consider further evaluation with dedicated CT of the chest. Electronically Signed   By: Signa Kell M.D.   On: 04/04/2023 07:46   MR 3D Recon At Scanner  Result Date: 04/04/2023 CLINICAL DATA:  Evaluate pancreas lesion. EXAM: MRI ABDOMEN WITHOUT AND WITH CONTRAST TECHNIQUE: Multiplanar multisequence MR imaging of the abdomen was performed both before and after the administration of intravenous contrast. CONTRAST:  8mL GADAVIST GADOBUTROL 1 MMOL/ML IV SOLN COMPARISON:  CT 04/03/2023 FINDINGS: Lower chest: No acute findings. Subpleural nodule within the right lower lobe measures 7 mm, image 11/17. Hepatobiliary: Multiple rim enhancing lesions are identified within the liver. The largest is in segment 4 B measuring 3.4 x 3.1, image 32/17. Lesion within segment 6 measures 1.4 by 1.0 cm, image 54/17. Gallbladder appears within normal limits. No gallstones or gallbladder wall edema. The common bile duct measures 6 mm in maximum diameter which is considered upper limits of normal. Pancreas: There is a large mass involving the body and tail of pancreas which is hypoenhancing measuring 6.4 by 2.2 cm, image 37/19. There is diffuse dilatation of the main duct and side branches of the main duct within the tail of pancreas compatible with main ductal obstruction. Encasement of the celiac artery and its branches are identified. At least partial encasement of the left side of the splenic artery is suspected, image 40/19. The portal vein, portal venous confluence and SMV do not appear to be involved. The splenic vein appears completely occluded. Spleen:  Within normal limits in size and appearance. Adrenals/Urinary Tract: Normal adrenal glands. No  signs of kidney mass or hydronephrosis. Stomach/Bowel: Stomach appears nondistended. No dilated bowel loops identified. Vascular/Lymphatic: Normal appearance of the abdominal aorta. Lymph node within the porta hepatic region measures 1.3 cm, image 30/17. Other: Trace fluid identified around the spleen. No focal fluid collections identified. Musculoskeletal: No suspicious bone lesions identified. IMPRESSION: 1. There is a large hypoenhancing mass involving the body and tail of pancreasa compatible with pancreatic adenocarcinoma. 2. 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 At least partial encasement of the left side of the splenic artery is suspected. The portal vein, portal venous confluence and SMV do not appear to be involved. The splenic vein appears completely occluded. 3. Multiple rim enhancing lesions are identified within the liver compatible with metastatic disease. 4. Lymph node within the porta hepatic region measures 1.3 cm. Suspicious for nodal metastasis. 5. Subpleural nodule within the right lower lobe measures 7 mm. Consider further evaluation with dedicated CT of the chest. Electronically Signed   By: Signa Kell M.D.   On: 04/04/2023 07:46   CT ABDOMEN PELVIS W CONTRAST  Addendum Date: 04/03/2023   ADDENDUM REPORT: 04/03/2023 22:33 ADDENDUM: Critical Value/emergent results were called by telephone at the time of interpretation on 04/03/2023 at 10:33 pm to provider Riki Sheer , who verbally acknowledged these results. Electronically  Signed   By: Thornell Sartorius M.D.   On: 04/03/2023 22:33   Result Date: 04/03/2023 CLINICAL DATA:  Abdominal pain, acute, nonlocalized. EXAM: CT ABDOMEN AND PELVIS WITH CONTRAST TECHNIQUE: Multidetector CT imaging of the abdomen and pelvis was performed using the standard protocol following bolus administration of intravenous contrast. RADIATION DOSE REDUCTION: This exam was performed according to the departmental dose-optimization  program which includes automated exposure control, adjustment of the mA and/or kV according to patient size and/or use of iterative reconstruction technique. CONTRAST:  75mL OMNIPAQUE IOHEXOL 300 MG/ML  SOLN COMPARISON:  12/18/2020. FINDINGS: Lower chest: A 6 mm nodule is present in the right lower lobe, axial image 9. A 1 cm nodule is noted in the right lower lobe, axial image 23. Hepatobiliary: A 1.2 cm hypodensity with surrounding enhancement is noted in the right lobe of the liver, segment 5. An ill-defined hypodensity is present in the left lobe of the liver measuring 2.3 cm, hepatic segment 4B. An ill-defined hypodensity is present in the left lobe of the liver measuring 9 mm, hepatic segment 2. No biliary ductal dilatation. The gallbladder is without stones. Pancreas: An ill-defined hypodense mass is present in the distal pancreas measuring 9.9 x 2.9 cm. There is mild pancreatic ductal dilatation at the head of the pancreas measuring 4 mm. Mild fat stranding is noted about the tail of the pancreas. Spleen: Normal in size without focal abnormality. Adrenals/Urinary Tract: No adrenal nodule or mass. The kidneys enhance symmetrically. No renal calculus or hydronephrosis. 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 Stomach/Bowel: Stomach is partially distended. Appendix appears normal. No evidence of bowel wall thickening, distention, or inflammatory changes. No bowel obstruction, free air, or pneumatosis. Vascular/Lymphatic: Aortic atherosclerosis. Prominent lymph nodes are present in the gastrohepatic ligament. The portal vein and superior mesenteric vein are patent. Splenic vein is not definitely seen on exam and may be occluded. Reproductive: Status post hysterectomy. No adnexal masses. Other: No abdominopelvic ascites. Musculoskeletal: Degenerative changes are present in the thoracolumbar spine. No acute or suspicious osseous abnormality.  IMPRESSION: 1. Hypoenhancing masslike lesion in the tail of the pancreas measuring 9.9 x 2.9 cm. Mild surrounding fat stranding is noted. Differential diagnosis includes pancreatic adenocarcinoma versus pancreatitis. Correlation with amylase, lipase, and CA 19-9 is recommended. 2. The portal vein and superior mesenteric vein are patent. The splenic vein is not visualized on exam and may be occluded. 3. 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 4. Scattered ill-defined hypodensities in the liver new from the previous exam in the possibility of underlying metastatic disease can not be excluded. MRI with contrast is recommended for further evaluation. 5. Aortic atherosclerosis. 6. Stable right lower lobe pulmonary nodules measuring up to 1.0 cm. Consider one of the following in 3 months for both low-risk and high-risk individuals: (a) repeat chest CT, (b) follow-up PET-CT, or (c) tissue sampling. This recommendation follows the consensus statement: Guidelines for Management of Incidental Pulmonary Nodules Detected on CT Images: From the Fleischner Society 2017; Radiology 2017; 284:228-243. 7. Remaining incidental findings as described above. Electronically Signed: By: Thornell Sartorius M.D. On: 04/03/2023 21:57    Assessment / Plan: Mass in the pancreatic tail with hypodensities in the liver 9.9 x 2.9 cm masslike lesion in the tail of the pancreas seen on CT imaging.  Findings concerning for pancreatic adenocarcinoma.  Lipase is only 63.  LFTs within normal limits.  Patient does  report unintentional weight loss of 125 pounds over the last year. Patient underwent MRI this morning.  Large hypoenhancing mass involving the body and tail of pancreas was noted.  Compatible with pancreatic adenocarcinoma.  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  Multiple rim-enhancing lesions also noted in the liver compatible with metastatic  disease.  Suspicious appearing lymph nodes also noted.  Pulmonary nodules noted.  CA 19-9 is only 77.   Paroxysmal atrial fibrillation:  On coumadin.  INR 1.3 today.   History of rheumatic heart disease status post mitral valve and tricuspid valve repair in May 2023/chronic diastolic CHF    Chronic kidney disease stage IIIa/hyponatremia    Acute cystitis:  On cetriaxone.   -Plan is for EUS with Dr. Meridee Score on Thursday, 5/2. -Pain control per primary service.  I did order a K-pad for her to try as well, which has helped somewhat. -INR tomorrow.  Of note, patient had switched her GI care to Dr. Donnajean Lopes, MD with [facility] so should follow-up with them for further GI care upon discharge.    LOS: 1 day   Princella Pellegrini. Zehr  04/05/2023, 1:12 PM     Attending physician's note   I have taken history, reviewed the chart and examined the patient. I performed a substantive portion of this encounter, including complete performance of at least one of the key components, in conjunction with the APP. I agree with the Advanced Practitioner's note, impression and recommendations.   CA 19-9: 77 INR 1.3 today  Large (10 cm) pancreatic tail mass-suspected AdenoCa with LN/liver mets w/t vascular involvement. ?Lung involvement Wt loss Paroxysmal atrial fibrillation on Coumadin (on hold)  Plan: -Follow results of CT chest done today -EUS by Dr. Meridee Score 5/2.  Discussed in detail with pt and family by bedside -Trend INR. Need <2 for EUS.    Edman Circle, MD [facility] [phone]

## 2023-04-05 NOTE — Plan of Care (Signed)
  Problem: Skin Integrity: Goal: Risk for impaired skin integrity will decrease Outcome: Progressing   Problem: Clinical Measurements: Goal: Ability to maintain clinical measurements within normal limits will improve Outcome: Progressing Goal: Will remain free from infection Outcome: Progressing Goal: Diagnostic test results will improve Outcome: Progressing Goal: Respiratory complications will improve Outcome: Progressing Goal: Cardiovascular complication will be avoided Outcome: Progressing   Problem: Activity: Goal: Risk for activity intolerance will decrease Outcome: Progressing   Problem: Nutrition: Goal: Adequate nutrition will be maintained Outcome: Progressing   Problem: Coping: Goal: Level of anxiety will decrease Outcome: Progressing   Problem: Pain Managment: Goal: General experience of comfort will improve Outcome: Progressing   Problem: Safety: Goal: Ability to remain free from injury will improve Outcome: Progressing   Problem: Skin Integrity: Goal: Risk for impaired skin integrity will decrease Outcome: Progressing

## 2023-04-05 NOTE — Inpatient Diabetes Management (Signed)
Inpatient Diabetes Program Recommendations  AACE/ADA: New Consensus Statement on Inpatient Glycemic Control (2015)  Target Ranges:  Prepandial:   less than 140 mg/dL      Peak postprandial:   less than 180 mg/dL (1-2 hours)      Critically ill patients:  140 - 180 mg/dL   Lab Results  Component Value Date   GLUCAP 340 (H) 04/05/2023   HGBA1C 8.7 (H) 04/04/2023    Review of Glycemic Control  Diabetes history: DM2 Outpatient Diabetes medications: Farxiga 10 mg QD, tradjenta 5 mg QD Current orders for Inpatient glycemic control: Novolog 0-9 TID and 0-5 HS  HgbA1C - 8.7% 340 mg/dL this am  Inpatient Diabetes Program Recommendations:    Consider adding Semglee 10 units QD  If post-prandials remain > 180 mg/dL, consider adding Novolog 3 units TID  Continue to follow.  Thank you. Ailene Ards, RD, LDN, CDCES Inpatient Diabetes Coordinator [phone]

## 2023-04-06 ENCOUNTER — Ambulatory Visit: Payer: 59 | Admitting: Podiatry

## 2023-04-06 DIAGNOSIS — J449 Chronic obstructive pulmonary disease, unspecified: Secondary | ICD-10-CM | POA: Diagnosis not present

## 2023-04-06 DIAGNOSIS — E785 Hyperlipidemia, unspecified: Secondary | ICD-10-CM

## 2023-04-06 DIAGNOSIS — I480 Paroxysmal atrial fibrillation: Secondary | ICD-10-CM

## 2023-04-06 DIAGNOSIS — I091 Rheumatic diseases of endocardium, valve unspecified: Secondary | ICD-10-CM

## 2023-04-06 DIAGNOSIS — E1169 Type 2 diabetes mellitus with other specified complication: Secondary | ICD-10-CM

## 2023-04-06 DIAGNOSIS — K8689 Other specified diseases of pancreas: Secondary | ICD-10-CM | POA: Diagnosis not present

## 2023-04-06 DIAGNOSIS — F419 Anxiety disorder, unspecified: Secondary | ICD-10-CM

## 2023-04-06 DIAGNOSIS — F32A Depression, unspecified: Secondary | ICD-10-CM

## 2023-04-06 DIAGNOSIS — N3000 Acute cystitis without hematuria: Secondary | ICD-10-CM

## 2023-04-06 DIAGNOSIS — R918 Other nonspecific abnormal finding of lung field: Secondary | ICD-10-CM

## 2023-04-06 LAB — BASIC METABOLIC PANEL
Anion gap: 8 (ref 5–15)
BUN: 23 mg/dL (ref 8–23)
CO2: 22 mmol/L (ref 22–32)
Calcium: 8.3 mg/dL — ABNORMAL LOW (ref 8.9–10.3)
Chloride: 101 mmol/L (ref 98–111)
Creatinine, Ser: 1.17 mg/dL — ABNORMAL HIGH (ref 0.44–1.00)
GFR, Estimated: 52 mL/min — ABNORMAL LOW (ref >60)
Glucose, Bld: 320 mg/dL — ABNORMAL HIGH (ref 70–99)
Potassium: 3.7 mmol/L (ref 3.5–5.1)
Sodium: 131 mmol/L — ABNORMAL LOW (ref 135–145)

## 2023-04-06 LAB — GLUCOSE, CAPILLARY
Glucose-Capillary: 314 mg/dL — ABNORMAL HIGH (ref 70–99)
Glucose-Capillary: 404 mg/dL — ABNORMAL HIGH (ref 70–99)
Glucose-Capillary: 495 mg/dL — ABNORMAL HIGH (ref 70–99)
Glucose-Capillary: 338 mg/dL — ABNORMAL HIGH (ref 70–99)

## 2023-04-06 LAB — CBC
HCT: 41.6 % (ref 36.0–46.0)
Hemoglobin: 14.2 g/dL (ref 12.0–15.0)
MCH: 29.3 pg (ref 26.0–34.0)
MCHC: 34.1 g/dL (ref 30.0–36.0)
MCV: 86 fL (ref 80.0–100.0)
Platelets: 126 10*3/uL — ABNORMAL LOW (ref 150–400)
RBC: 4.84 MIL/uL (ref 3.87–5.11)
RDW: 15.8 % — ABNORMAL HIGH (ref 11.5–15.5)
WBC: 6.1 10*3/uL (ref 4.0–10.5)
nRBC: 0 % (ref 0.0–0.2)

## 2023-04-06 LAB — PROTIME-INR
INR: 1 (ref 0.8–1.2)
Prothrombin Time: 12.8 seconds (ref 11.4–15.2)

## 2023-04-06 LAB — URINE CULTURE: Culture: >=100000 — AB

## 2023-04-06 MED ORDER — INSULIN GLARGINE-YFGN 100 UNIT/ML ~~LOC~~ SOLN
20.0000 [IU] | Freq: Every day | SUBCUTANEOUS | Status: DC
  Filled 2023-04-06: qty 0.2

## 2023-04-06 MED ORDER — INSULIN ASPART 100 UNIT/ML IJ SOLN: 0.0000 [IU] | Freq: Three times a day (TID) | INTRAMUSCULAR | Status: DC

## 2023-04-06 MED ORDER — INSULIN STARTER KIT- PEN NEEDLES (ENGLISH)
1.0000 | Freq: Once | Status: AC
  Administered 2023-04-06: 1
  Filled 2023-04-06 (×2): qty 1

## 2023-04-06 MED ORDER — INSULIN ASPART 100 UNIT/ML IJ SOLN
15.0000 [IU] | Freq: Once | INTRAMUSCULAR | Status: AC
  Administered 2023-04-06: 15 [IU] via SUBCUTANEOUS

## 2023-04-06 MED ORDER — INSULIN ASPART 100 UNIT/ML IJ SOLN
20.0000 [IU] | Freq: Once | INTRAMUSCULAR | Status: AC
  Administered 2023-04-06: 20 [IU] via SUBCUTANEOUS

## 2023-04-06 MED ORDER — CEFAZOLIN SODIUM-DEXTROSE 1-4 GM/50ML-% IV SOLN
1.0000 g | Freq: Three times a day (TID) | INTRAVENOUS | Status: DC
  Administered 2023-04-06 – 2023-04-09 (×8): 1 g via INTRAVENOUS
  Filled 2023-04-06 (×11): qty 50

## 2023-04-06 MED ORDER — NICOTINE 7 MG/24HR TD PT24
7.0000 mg | MEDICATED_PATCH | Freq: Every day | TRANSDERMAL | Status: DC
  Administered 2023-04-06 – 2023-04-08 (×3): 7 mg via TRANSDERMAL
  Filled 2023-04-06 (×4): qty 1

## 2023-04-06 MED ORDER — INSULIN ASPART 100 UNIT/ML IJ SOLN
0.0000 [IU] | Freq: Three times a day (TID) | INTRAMUSCULAR | Status: DC
  Administered 2023-04-07: 15 [IU] via SUBCUTANEOUS

## 2023-04-06 MED ORDER — LIVING WELL WITH DIABETES BOOK
Freq: Once | Status: AC
  Filled 2023-04-06: qty 1

## 2023-04-06 NOTE — H&P (View-Only) (Signed)
Brief GI note:  Tolerating p.o. well. No complaints  Scheduled for EUS with Bx for 3:45 PM tomorrow with Dr. Meridee Score. NPO after clear liquid diet 7 AM tomorrow Check INR in a.m.  Patient's sister would like to be called after EUS  RG

## 2023-04-06 NOTE — Inpatient Diabetes Management (Signed)
Inpatient Diabetes Program Recommendations  AACE/ADA: New Consensus Statement on Inpatient Glycemic Control (2015)  Target Ranges:  Prepandial:   less than 140 mg/dL      Peak postprandial:   less than 180 mg/dL (1-2 hours)      Critically ill patients:  140 - 180 mg/dL   Lab Results  Component Value Date   GLUCAP 495 (H) 04/06/2023   HGBA1C 8.7 (H) 04/04/2023    Review of Glycemic Control  Diabetes history: DM2 Outpatient Diabetes medications: tradjenta 5 mg QD, Faxiga 10 mg QD Current orders for Inpatient glycemic control: Semglee 20 QD, Novolog 0-15 TID with meals and 0-5 HS  HgbA1C - 8.7% 314, 404, 495 today Pancreatic mass present EUS with Bx 5/2.  Will be NPO after CL diet in am  Inpatient Diabetes Program Recommendations:    Will likely need to be discharged on both basal and bolus insulin.  Change Novolog to 0-15 Q4H in am.  Spoke with pt at bedside this afternoon regarding her diabetes control. Pt states in the past few weeks she noticed blood sugars rising and didn't know why. Monitors blood sugars and takes DM meds as prescribed. Discussed problems with pancreas mass and likely reason for high blood sugars. Pt states she was on insulin about 20 years ago. Demonstrated insulin pen use and starter kit has been ordered. Pt is ok with going home on insulin. May be good candidate for [place]. Will check with pharmacy for preferred insulins and CGMs at discharge.   F/U in am.   Thank you. Ailene Ards, RD, LDN, CDCES Inpatient Diabetes Coordinator [phone]

## 2023-04-06 NOTE — Progress Notes (Signed)
Brief GI note:  Tolerating p.o. well. No complaints  Scheduled for EUS with Bx for 3:45 PM tomorrow with Dr. Mansouraty. NPO after clear liquid diet 7 AM tomorrow Check INR in a.m.  Patient's sister would like to be called after EUS  RG 

## 2023-04-06 NOTE — Progress Notes (Signed)
PROGRESS NOTE    [name]  ZOX:[id number] DOB: 04-Mar-1961 DOA: 04/03/2023 PCP: Chrys Racer, MD   Brief Narrative:   62 y.o. female with medical history significant for rheumatic heart disease s/p MV and TV repair 04/2022, history of pericardial effusion/tamponade s/p pericardiocentesis 05/2022, PAF on Coumadin, CKD stage IIIa, COPD, T2DM, HTN, tobacco use presented with abdominal pain and dysuria.  She was found to have UTI along with possible mass in the pancreatic tail and CT of abdomen.  She was started on IV antibiotics.  GI was consulted.  Assessment & Plan:   Mass in the pancreatic body and tail with hypodensities in the liver -MRI of abdomen showed findings suggestive of possible pancreatic adenocarcinoma with possible liver and nodal metastases.  CT chest with contrast showed nonspecific small solid pulmonary nodules: Recommend short-term follow-up CT chest in 3 months -GI following: Planning for EUS and biopsy on 04/07/23. -Prior hospitalist communicated with Dr. Bertis Ruddy who will arrange for outpatient follow-up next week  E. coli acute cystitis -Follow sensitivities.  Currently on Rocephin.  Paroxysmal A-fib -Rate controlled.  Continue Cardizem and metoprolol.  Coumadin on hold.  INR 1 today.  Patient had to be given vitamin K during the hospitalization to decrease INR below 2.  History of rheumatic heart disease status post mitral valve and tricuspid valve repair in May 2023 Chronic diastolic CHF -Prior EF of 60 to 65%.  Currently compensated.  Strict input and output.  Daily weights.  Continue Lasix and metoprolol.  Outpatient follow-up with cardiology/Dr. Anne Fu  Essential hypertension Hyperlipidemia -Continue antihypertensives as above.  Continue statin  Diabetes mellitus type 2 with hyperglycemia -A1c 8.7.  CBGs are poorly controlled.  Farxiga and linagliptin on hold.  Increase Semglee to 20 units daily.  Continue CBGs with SSI  Chronic kidney disease stage  IIIa -Creatinine stable.  Hyponatremia Mild.  Monitor  Thrombocytopenia -Questionable cause.  No signs of bleeding.  Monitor  History of depression and anxiety -Continue current regimen.  Outpatient follow-up with psychiatry  Pulmonary nodules -Plan as above  History of COPD -Stable    DVT prophylaxis: Subcutaneous heparin.  Coumadin on hold Code Status: Full Family Communication: None at bedside Disposition Plan: Status is: Inpatient Remains inpatient appropriate because: Of severity of illness.  Consultants: GI  Procedures: None  Antimicrobials: Rocephin from 04/03/2023 onwards   Subjective: Patient seen and examined at bedside.  Complains of intermittent abdominal pain.  Denies any current nausea, vomiting, fever or chest pain.  Objective: Vitals:   04/05/23 1336 04/05/23 2155 04/06/23 0552 04/06/23 0819  BP: (!) 121/93 124/86 (!) 107/92 (!) 126/95  Pulse: 99 100 85 97  Resp: 18 18 18    Temp: 97.9 F (36.6 C) 98 F (36.7 C) 98.2 F (36.8 C)   TempSrc: Oral Oral Oral   SpO2: 94% 98% 99% 94%  Weight:      Height:        Intake/Output Summary (Last 24 hours) at 04/06/2023 1025 Last data filed at 04/06/2023 0443 Gross per 24 hour  Intake 580 ml  Output --  Net 580 ml   Filed Weights   04/03/23 1629 04/04/23 0542  Weight: 79.4 kg 74.2 kg    Examination:  General exam: Appears calm and comfortable.  On room air. Respiratory system: Bilateral decreased breath sounds at bases with scattered crackles Cardiovascular system: S1 & S2 heard, Rate controlled Gastrointestinal system: Abdomen is nondistended, soft and mildly tender.  Normal bowel sounds heard. Extremities: No cyanosis, clubbing, edema  Central nervous system: Alert and oriented. No focal neurological deficits. Moving extremities Skin: No rashes, lesions or ulcers Psychiatry: Affect is mostly flat.  Not agitated.  Data Reviewed: I have personally reviewed following labs and imaging  studies  CBC: Recent Labs  Lab 04/03/23 1903 04/04/23 0746 04/06/23 0557  WBC 8.2 6.3 6.1  NEUTROABS 5.6  --   --   HGB 15.4* 14.3 14.2  HCT 45.3 41.9 41.6  MCV 86.3 86.9 86.0  PLT 147* 122* 126*   Basic Metabolic Panel: Recent Labs  Lab 04/03/23 1903 04/04/23 0746 04/06/23 0557  NA 130* 131* 131*  K 3.9 3.7 3.7  CL 97* 101 101  CO2 20* 20* 22  GLUCOSE 305* 297* 320*  BUN 34* 24* 23  CREATININE 1.31* 0.94 1.17*  CALCIUM 8.7* 8.2* 8.3*   GFR: Estimated Creatinine Clearance: 55 mL/min (A) (by C-G formula based on SCr of 1.17 mg/dL (H)). Liver Function Tests: Recent Labs  Lab 04/03/23 1903 04/04/23 0746  AST 15 13*  ALT 19 17  ALKPHOS 68 63  BILITOT 0.5 0.7  PROT 7.5 6.4*  ALBUMIN 4.3 3.9   Recent Labs  Lab 04/03/23 1903  LIPASE 63*   No results for input(s): "AMMONIA" in the last 168 hours. Coagulation Profile: Recent Labs  Lab 04/03/23 2310 04/04/23 0746 04/05/23 0121 04/06/23 0557  INR 3.6* 3.4* 1.3* 1.0   Cardiac Enzymes: No results for input(s): "CKTOTAL", "CKMB", "CKMBINDEX", "TROPONINI" in the last 168 hours. BNP (last 3 results) Recent Labs    08/24/22 1555 12/01/22 1629  PROBNP 2,927* 2,487*   HbA1C: Recent Labs    04/04/23 0746  HGBA1C 8.7*   CBG: Recent Labs  Lab 04/05/23 0746 04/05/23 1150 04/05/23 1538 04/05/23 2153 04/06/23 0752  GLUCAP 340* 399* 270* 445* 314*   Lipid Profile: No results for input(s): "CHOL", "HDL", "LDLCALC", "TRIG", "CHOLHDL", "LDLDIRECT" in the last 72 hours. Thyroid Function Tests: No results for input(s): "TSH", "T4TOTAL", "FREET4", "T3FREE", "THYROIDAB" in the last 72 hours. Anemia Panel: No results for input(s): "VITAMINB12", "FOLATE", "FERRITIN", "TIBC", "IRON", "RETICCTPCT" in the last 72 hours. Sepsis Labs: No results for input(s): "PROCALCITON", "LATICACIDVEN" in the last 168 hours.  Recent Results (from the past 240 hour(s))  Urine Culture (for pregnant, neutropenic or urologic  patients or patients with an indwelling urinary catheter)     Status: Abnormal (Preliminary result)   Collection Time: 04/03/23 11:16 PM   Specimen: Urine, Clean Catch  Result Value Ref Range Status   Specimen Description   Final    URINE, CLEAN CATCH Performed at [facility], [address] [place], [place] [address]    Special Requests   Final    NONE Performed at [facility], [address], [place], [place] [address]    Culture (A)  Final    >=100,000 COLONIES/mL ESCHERICHIA COLI SUSCEPTIBILITIES TO FOLLOW Performed at [facility], [address], [place], [place] [address]    Report Status PENDING  Incomplete         Radiology Studies: CT CHEST W CONTRAST  Result Date: 04/05/2023 CLINICAL DATA:  Lung nodule EXAM: CT CHEST WITH CONTRAST TECHNIQUE: Multidetector CT imaging of the chest was performed during intravenous contrast administration. RADIATION DOSE REDUCTION: This exam was performed according to the departmental dose-optimization program which includes automated exposure control, adjustment of the mA and/or kV according to patient size and/or use of iterative reconstruction technique. CONTRAST:  75mL OMNIPAQUE IOHEXOL 300 MG/ML  SOLN COMPARISON:  Chest CT dated May 25, 2022 FINDINGS: Cardiovascular: Normal heart size. No pericardial effusion. Normal caliber thoracic aorta with mild atherosclerotic disease. Prior tricuspid and mitral valve replacements. No suspicious filling defects of the pulmonary arteries. Mediastinum/Nodes: Esophagus and thyroid are unremarkable. No enlarged lymph nodes seen in the chest. Lungs/Pleura: Central airways are patent. No consolidation, pleural effusion or pneumothorax. Small solid pulmonary nodules. Reference solid pulmonary nodule of the left lower lobe measuring 9 x 6 mm. Additional reference solid pulmonary nodule of the right lower lobe measuring 4 mm on series 7, image 110. Upper Abdomen:  Partially visualized pancreatic tail mass and hypoattenuating liver lesions, better evaluated on recently performed MRI of the abdomen and CT of the abdomen and pelvis. Musculoskeletal: Prior median sternotomy. No aggressive appearing osseous lesions. IMPRESSION: 1. Nonspecific small solid pulmonary nodules, largest of the left lower lobe measures 9 x 6 mm. Recommend short-term follow-up chest CT in 3 months for further evaluation. 2. No evidence nodal metastatic disease in the chest. 3. Partially visualized pancreatic tail mass and hypoattenuating liver lesions, better evaluated on recently performed MRI of the abdomen and CT of the abdomen and pelvis. 4. Aortic Atherosclerosis (ICD10-I70.0). Electronically Signed   By: Allegra Lai M.D.   On: 04/05/2023 16:21        Scheduled Meds:  ARIPiprazole  2.5 mg Oral Daily   atorvastatin  20 mg Oral Daily   buPROPion  300 mg Oral q AM   diltiazem  300 mg Oral Daily   FLUoxetine  20 mg Oral Daily   furosemide  40 mg Oral Daily   gabapentin  300 mg Oral TID   heparin injection (subcutaneous)  5,000 Units Subcutaneous Q8H   insulin aspart  0-5 Units Subcutaneous QHS   insulin aspart  0-9 Units Subcutaneous TID WC   insulin glargine-yfgn  10 Units Subcutaneous Daily   lubiprostone  24 mcg Oral BID WC   metoprolol tartrate  12.5 mg Oral BID   nicotine  7 mg Transdermal Daily   pantoprazole  40 mg Oral Daily   sodium chloride flush  3 mL Intravenous Q12H   traZODone  200 mg Oral QHS   vortioxetine HBr  10 mg Oral Daily   Continuous Infusions:  cefTRIAXone (ROCEPHIN)  IV Stopped (04/05/23 2217)          Glade Lloyd, MD [facility] 04/06/2023, 10:25 AM

## 2023-04-06 NOTE — Progress Notes (Signed)
Mobility Specialist - Progress Note   04/06/23 1139  Mobility  Activity Ambulated with assistance in hallway  Level of Assistance Modified independent, requires aide device or extra time  Assistive Device Front wheel walker  Distance Ambulated (ft) 460 ft  Activity Response Tolerated well  Mobility Referral Yes  $Mobility charge 1 Mobility   Pt received in bed and agreeable to mobility. No complaints during session. Pt to bed after session with all needs met.    [facility]

## 2023-04-07 ENCOUNTER — Telehealth (HOSPITAL_COMMUNITY): Payer: Self-pay | Admitting: Pharmacy Technician

## 2023-04-07 ENCOUNTER — Inpatient Hospital Stay (HOSPITAL_COMMUNITY): Payer: 59 | Admitting: Anesthesiology

## 2023-04-07 ENCOUNTER — Encounter (HOSPITAL_COMMUNITY)
Admission: EM | Disposition: A | Discharge status: Home / Self Care | Payer: Self-pay | Source: Home / Self Care | Attending: Internal Medicine

## 2023-04-07 ENCOUNTER — Encounter (HOSPITAL_COMMUNITY): Payer: Self-pay | Admitting: Internal Medicine

## 2023-04-07 ENCOUNTER — Other Ambulatory Visit (HOSPITAL_COMMUNITY): Payer: Self-pay

## 2023-04-07 DIAGNOSIS — I509 Heart failure, unspecified: Secondary | ICD-10-CM

## 2023-04-07 DIAGNOSIS — R918 Other nonspecific abnormal finding of lung field: Secondary | ICD-10-CM | POA: Diagnosis not present

## 2023-04-07 DIAGNOSIS — F419 Anxiety disorder, unspecified: Secondary | ICD-10-CM | POA: Diagnosis not present

## 2023-04-07 DIAGNOSIS — F17200 Nicotine dependence, unspecified, uncomplicated: Secondary | ICD-10-CM

## 2023-04-07 DIAGNOSIS — N3000 Acute cystitis without hematuria: Secondary | ICD-10-CM | POA: Diagnosis not present

## 2023-04-07 DIAGNOSIS — I899 Noninfective disorder of lymphatic vessels and lymph nodes, unspecified: Secondary | ICD-10-CM

## 2023-04-07 DIAGNOSIS — K3189 Other diseases of stomach and duodenum: Secondary | ICD-10-CM

## 2023-04-07 DIAGNOSIS — I110 Hypertensive heart disease with heart failure: Secondary | ICD-10-CM

## 2023-04-07 DIAGNOSIS — K449 Diaphragmatic hernia without obstruction or gangrene: Secondary | ICD-10-CM

## 2023-04-07 DIAGNOSIS — K209 Esophagitis, unspecified without bleeding: Secondary | ICD-10-CM

## 2023-04-07 DIAGNOSIS — K8689 Other specified diseases of pancreas: Secondary | ICD-10-CM | POA: Diagnosis not present

## 2023-04-07 DIAGNOSIS — K2289 Other specified disease of esophagus: Secondary | ICD-10-CM

## 2023-04-07 HISTORY — PX: ESOPHAGOGASTRODUODENOSCOPY (EGD) WITH PROPOFOL: SHX5813

## 2023-04-07 HISTORY — PX: EUS: SHX5427

## 2023-04-07 HISTORY — PX: FINE NEEDLE ASPIRATION: SHX5430

## 2023-04-07 HISTORY — PX: BIOPSY: SHX5522

## 2023-04-07 LAB — BASIC METABOLIC PANEL
Anion gap: 10 (ref 5–15)
BUN: 20 mg/dL (ref 8–23)
CO2: 23 mmol/L (ref 22–32)
Calcium: 7.8 mg/dL — ABNORMAL LOW (ref 8.9–10.3)
Chloride: 99 mmol/L (ref 98–111)
Creatinine, Ser: 1.02 mg/dL — ABNORMAL HIGH (ref 0.44–1.00)
GFR, Estimated: >60 mL/min (ref >60)
Glucose, Bld: 369 mg/dL — ABNORMAL HIGH (ref 70–99)
Potassium: 3.4 mmol/L — ABNORMAL LOW (ref 3.5–5.1)
Sodium: 132 mmol/L — ABNORMAL LOW (ref 135–145)

## 2023-04-07 LAB — MAGNESIUM: Magnesium: 1.6 mg/dL — ABNORMAL LOW (ref 1.7–2.4)

## 2023-04-07 LAB — CBC
HCT: 38 % (ref 36.0–46.0)
Hemoglobin: 12.9 g/dL (ref 12.0–15.0)
MCH: 29.1 pg (ref 26.0–34.0)
MCHC: 33.9 g/dL (ref 30.0–36.0)
MCV: 85.6 fL (ref 80.0–100.0)
Platelets: 101 10*3/uL — ABNORMAL LOW (ref 150–400)
RBC: 4.44 MIL/uL (ref 3.87–5.11)
RDW: 15.4 % (ref 11.5–15.5)
WBC: 4.5 10*3/uL (ref 4.0–10.5)
nRBC: 0 % (ref 0.0–0.2)

## 2023-04-07 LAB — PROTIME-INR
INR: 1 (ref 0.8–1.2)
Prothrombin Time: 12.9 seconds (ref 11.4–15.2)

## 2023-04-07 LAB — GLUCOSE, CAPILLARY
Glucose-Capillary: 389 mg/dL — ABNORMAL HIGH (ref 70–99)
Glucose-Capillary: 273 mg/dL — ABNORMAL HIGH (ref 70–99)
Glucose-Capillary: 113 mg/dL — ABNORMAL HIGH (ref 70–99)
Glucose-Capillary: 88 mg/dL (ref 70–99)
Glucose-Capillary: 79 mg/dL (ref 70–99)
Glucose-Capillary: 85 mg/dL (ref 70–99)
Glucose-Capillary: 190 mg/dL — ABNORMAL HIGH (ref 70–99)
Glucose-Capillary: 252 mg/dL — ABNORMAL HIGH (ref 70–99)

## 2023-04-07 SURGERY — ESOPHAGEAL ENDOSCOPIC ULTRASOUND (EUS) RADIAL
Anesthesia: Monitor Anesthesia Care

## 2023-04-07 MED ORDER — MAGNESIUM SULFATE 2 GM/50ML IV SOLN
2.0000 g | Freq: Once | INTRAVENOUS | Status: AC
  Administered 2023-04-07: 2 g via INTRAVENOUS
  Filled 2023-04-07: qty 50

## 2023-04-07 MED ORDER — GLYCOPYRROLATE 0.2 MG/ML IJ SOLN
INTRAMUSCULAR | Status: DC | PRN
  Administered 2023-04-07: .2 mg via INTRAVENOUS

## 2023-04-07 MED ORDER — PROPOFOL 1000 MG/100ML IV EMUL
INTRAVENOUS | Status: AC
  Filled 2023-04-07: qty 100

## 2023-04-07 MED ORDER — INSULIN ASPART 100 UNIT/ML IJ SOLN
0.0000 [IU] | INTRAMUSCULAR | Status: DC
  Administered 2023-04-07: 4 [IU] via SUBCUTANEOUS
  Administered 2023-04-07: 11 [IU] via SUBCUTANEOUS
  Administered 2023-04-08: 7 [IU] via SUBCUTANEOUS
  Administered 2023-04-08: 15 [IU] via SUBCUTANEOUS
  Administered 2023-04-08: 7 [IU] via SUBCUTANEOUS
  Administered 2023-04-08 – 2023-04-09 (×4): 11 [IU] via SUBCUTANEOUS
  Administered 2023-04-09: 7 [IU] via SUBCUTANEOUS

## 2023-04-07 MED ORDER — PROPOFOL 10 MG/ML IV BOLUS
INTRAVENOUS | Status: DC | PRN
  Administered 2023-04-07: 125 ug/kg/min via INTRAVENOUS

## 2023-04-07 MED ORDER — PROPOFOL 500 MG/50ML IV EMUL
INTRAVENOUS | Status: AC
  Filled 2023-04-07: qty 50

## 2023-04-07 MED ORDER — LACTATED RINGERS IV SOLN: INTRAVENOUS | Status: DC | PRN

## 2023-04-07 MED ORDER — INSULIN GLARGINE-YFGN 100 UNIT/ML ~~LOC~~ SOLN
25.0000 [IU] | Freq: Two times a day (BID) | SUBCUTANEOUS | Status: DC
  Administered 2023-04-07 (×2): 25 [IU] via SUBCUTANEOUS
  Filled 2023-04-07 (×3): qty 0.25

## 2023-04-07 MED ORDER — LIDOCAINE 2% (20 MG/ML) 5 ML SYRINGE
INTRAMUSCULAR | Status: DC | PRN
  Administered 2023-04-07: 80 mg via INTRAVENOUS

## 2023-04-07 NOTE — Transfer of Care (Signed)
Immediate Anesthesia Transfer of Care Note  Patient: [name]  Procedure(s) Performed: ESOPHAGEAL ENDOSCOPIC ULTRASOUND (EUS) RADIAL BIOPSY FINE NEEDLE ASPIRATION (FNA) LINEAR ESOPHAGOGASTRODUODENOSCOPY (EGD) WITH PROPOFOL  Patient Location: PACU  Anesthesia Type:MAC  Level of Consciousness: awake, alert , and oriented  Airway & Oxygen Therapy: Patient Spontanous Breathing and Patient connected to nasal cannula oxygen  Post-op Assessment: Report given to RN and Post -op Vital signs reviewed and stable  Post vital signs: Reviewed and stable  Last Vitals:  Vitals Value Taken Time  BP 131/96 04/07/23 1753  Temp    Pulse 74 04/07/23 1758  Resp 12 04/07/23 1758  SpO2 97 % 04/07/23 1758  Vitals shown include unvalidated device data.  Last Pain:  Vitals:   04/07/23 1544  TempSrc: Temporal  PainSc: 6       Patients Stated Pain Goal: 3 (04/07/23 1352)  Complications: No notable events documented.

## 2023-04-07 NOTE — Op Note (Addendum)
[facility] Patient Name: [name] Procedure Date: 04/07/2023 MRN: [id number] Attending MD: Corliss Parish , MD, [id number] Date of Birth: 02-12-1961 CSN: [id number] Age: 62 Admit Type: Inpatient Procedure:                Upper EUS Indications:              Suspected mass in pancreas on CT scan Providers:                Corliss Parish, MD, Lorenza Evangelist, RN,                            Fransisca Connors, Harrington Challenger, Technician Referring MD:             Inpatient Medical Service Medicines:                Monitored Anesthesia Care Complications:            No immediate complications. Estimated Blood Loss:     Estimated blood loss was minimal. Procedure:                Pre-Anesthesia Assessment:                           - Prior to the procedure, a History and Physical                            was performed, and patient medications and                            allergies were reviewed. The patient's tolerance of                            previous anesthesia was also reviewed. The risks                            and benefits of the procedure and the sedation                            options and risks were discussed with the patient.                            All questions were answered, and informed consent                            was obtained. Prior Anticoagulants: The patient                            last took Coumadin (warfarin) 4 days and heparin 1                            day prior to the procedure. ASA Grade Assessment:                            III - A patient with severe systemic disease. After  reviewing the risks and benefits, the patient was                            deemed in satisfactory condition to undergo the                            procedure.                           After obtaining informed consent, the endoscope was                            passed under direct vision. Throughout the                             procedure, the patient's blood pressure, pulse, and                            oxygen saturations were monitored continuously. The                            [id number]-[id number] ([id number]) Olympus endoscope was introduced                            through the mouth, and advanced to the second part                            of duodenum. The [id number]-[id number] ([id number]) Olympus                            duodenoscope was introduced through the mouth, and                            advanced to the area of papilla. The [id number]                            ([id number]) Olympus linear ultrasound scope was                            introduced through the mouth, and advanced to the                            duodenum for ultrasound examination from the                            stomach and duodenum. The upper EUS was                            accomplished without difficulty. The patient                            tolerated the procedure. Scope In: Scope Out: Findings:      ENDOSCOPIC FINDING: :      White nummular lesions were noted in the entire esophagus. Biopsies  were       taken with a cold forceps for histology to rule out Candida.      The Z-line was irregular and was found 40 cm from the incisors.      A 2 cm hiatal hernia was present.      Patchy mildly erythematous mucosa without bleeding was found in the       entire examined stomach. Biopsies were taken with a cold forceps for       histology and Helicobacter pylori testing.      No gross lesions were noted in the duodenal bulb, in the first portion       of the duodenum and in the second portion of the duodenum.      The major papilla was normal.      ENDOSONOGRAPHIC FINDING: :      An irregular mass was identified in the pancreatic body/tail. The mass       was hypoechoic. The mass measured 35 mm by 23 mm in maximal       cross-sectional diameter but there is extension more proximally towards       the tail (as concerning on the  imaging). The outer margins were       irregular. There was sonographic evidence suggesting invasion into the       superior mesenteric artery (manifested by encasement), the celiac trunk       (manifested by abutment) and the splenoportal confluence (manifested by       abutment). The remainder of the pancreas was examined. The       endosonographic appearance of parenchyma distal to this area was more       normal in appearance however, the upstream pancreatic duct indicated       duct dilation up to 6 mm, an irregularly contoured duct and prominent       ductal side-branches. Fine needle biopsy was performed of the mass.       Color Doppler imaging was utilized prior to needle puncture to confirm a       lack of significant vascular structures within the needle path. Five       passes were made with the 22 gauge Acquire biopsy needle using a       transgastric approach. Visible cores of tissue were obtained. Touch       preps were not performed as we did not have access to a cytotechnologist       at the time of today's procedure. Final cytology results are pending.      There was no sign of significant endosonographic abnormality in the       common bile duct and in the gallbladder. An unremarkable gallbladder was       identified.      No malignant-appearing lymph nodes were visualized in the celiac region       (level 20) and peripancreatic region.      Endosonographic imaging in the visualized portion of the liver showed no       mass.      The celiac region was visualized. Impression:               EGD impression:                           - White nummular lesions in esophageal mucosa.  Biopsied.                           - Z-line irregular, 40 cm from the incisors.                           - 2 cm hiatal hernia.                           - Erythematous mucosa in the stomach. Biopsied.                           - No gross lesions in the duodenal bulb, in  the                            first portion of the duodenum and in the second                            portion of the duodenum.                           - Normal major papilla.                           EUS impression:                           - A mass was identified in the pancreatic                            body/tail. Cytology results are pending. However,                            the endosonographic appearance is suspicious for                            adenocarcinoma with distal pancreatic duct                            obstruction and dilation noted. This was staged T4                            N0 Mx by endosonographic criteria as a result of                            the vasculature. The staging applies if malignancy                            is confirmed. Fine needle biopsy performed.                           - There was no sign of significant pathology in the                            common bile duct and in the  gallbladder.                           - No malignant-appearing lymph nodes were                            visualized in the celiac region (level 20) and                            peripancreatic region. Moderate Sedation:      Not Applicable - Patient had care per Anesthesia. Recommendation:           - The patient will be observed post-procedure,                            until all discharge criteria are met.                           - Return patient to hospital ward for ongoing care.                           - Advance diet as tolerated.                           - Observe patient's clinical course.                           - Await cytology results and await path results.                           - Although most concerning is the potential of this                            being a malignancy, will send IgG4 levels tomorrow.                           - May restart anticoagulation with heparin drip if                            needed at 6 AM  tomorrow without bolus otherwise                            wait a full 24 hours.                           - May restart warfarin tomorrow. If starting a NOAC                            wait 48 hours.                           - Further recommendations as per Medical/GI teams.                           - The findings and recommendations were discussed  with the patient.                           - The findings and recommendations were discussed                            with the patient's family. Procedure Code(s):        --- Professional ---                           [phone], Esophagogastroduodenoscopy, flexible,                            transoral; with transendoscopic ultrasound-guided                            intramural or transmural fine needle                            aspiration/biopsy(s), (includes endoscopic                            ultrasound examination limited to the esophagus,                            stomach or duodenum, and adjacent structures)                           43239, 59, Esophagogastroduodenoscopy, flexible,                            transoral; with biopsy, single or multiple Diagnosis Code(s):        --- Professional ---                           K22.89, Other specified disease of esophagus                           K44.9, Diaphragmatic hernia without obstruction or                            gangrene                           K31.89, Other diseases of stomach and duodenum                           K86.89, Other specified diseases of pancreas                           I89.9, Noninfective disorder of lymphatic vessels                            and lymph nodes, unspecified                           R93.3, Abnormal findings on diagnostic imaging of  other parts of digestive tract CPT copyright 2022 [employer]. All rights reserved. The codes documented in this report are preliminary and  upon coder review may  be revised to meet current compliance requirements. Corliss Parish, MD 04/07/2023 5:49:21 PM Number of Addenda: 0

## 2023-04-07 NOTE — Progress Notes (Signed)
PROGRESS NOTE    [name]  RUE:[id number] DOB: [birth year] DOA: 04/03/2023 PCP: Chrys Racer, MD   Brief Narrative:   62 y.o. female with medical history significant for rheumatic heart disease s/p MV and TV repair 04/2022, history of pericardial effusion/tamponade s/p pericardiocentesis 05/2022, PAF on Coumadin, CKD stage IIIa, COPD, T2DM, HTN, tobacco use presented with abdominal pain and dysuria.  She was found to have UTI along with possible mass in the pancreatic tail and CT of abdomen.  She was started on IV antibiotics.  GI was consulted.  Assessment & Plan:   Mass in the pancreatic body and tail with hypodensities in the liver -MRI of abdomen showed findings suggestive of possible pancreatic adenocarcinoma with possible liver and nodal metastases.  CT chest with contrast showed nonspecific small solid pulmonary nodules: Recommend short-term follow-up CT chest in 3 months -GI following: Planning for EUS and biopsy today -Prior hospitalist communicated with Dr. Bertis Ruddy who will arrange for outpatient follow-up next week  E. coli acute cystitis -Antibiotics has been switched to Ancef  Paroxysmal A-fib -Rate controlled.  Continue Cardizem and metoprolol.  Coumadin on hold.  INR pending today.  Patient had to be given vitamin K during the hospitalization to decrease INR below 2.  History of rheumatic heart disease status post mitral valve and tricuspid valve repair in May 2023 Chronic diastolic CHF -Prior EF of 60 to 65%.  Currently compensated.  Strict input and output.  Daily weights.  Continue Lasix and metoprolol.  Outpatient follow-up with cardiology/Dr. Anne Fu  Essential hypertension Hyperlipidemia -Continue antihypertensives as above.  Continue statin  Diabetes mellitus type 2 with hyperglycemia -A1c 8.7.  CBGs are poorly controlled.  Farxiga and linagliptin on hold.  Increase Semglee to 25 units twice daily.  Continue CBGs with SSI  Chronic kidney disease stage  IIIa -Creatinine stable.    Hyponatremia -Labs pending for today.  Thrombocytopenia -Questionable cause.  No signs of bleeding.  Monitor  History of depression and anxiety -Continue current regimen.  Outpatient follow-up with psychiatry  Pulmonary nodules -Plan as above  History of COPD -Stable    DVT prophylaxis: Subcutaneous heparin.  Coumadin on hold Code Status: Full Family Communication: None at bedside Disposition Plan: Status is: Inpatient Remains inpatient appropriate because: Of severity of illness.  Consultants: GI  Procedures: None  Antimicrobials:  Anti-infectives (From admission, onward)    Start     Dose/Rate Route Frequency Ordered Stop   04/06/23 2200  ceFAZolin (ANCEF) IVPB 1 g/50 mL premix        1 g 100 mL/hr over 30 Minutes Intravenous Every 8 hours 04/06/23 1401     04/04/23 2200  cefTRIAXone (ROCEPHIN) 1 g in sodium chloride 0.9 % 100 mL IVPB  Status:  Discontinued        1 g 200 mL/hr over 30 Minutes Intravenous Every 24 hours 04/03/23 2345 04/06/23 1401   04/03/23 2230  cefTRIAXone (ROCEPHIN) 1 g in sodium chloride 0.9 % 100 mL IVPB        1 g 200 mL/hr over 30 Minutes Intravenous  Once 04/03/23 2223 04/03/23 2340       Subjective: Patient seen and examined at bedside.  No fever, worsening shortness of breath, vomiting reported.  Still complains of intermittent abdominal pain. Objective: Vitals:   04/06/23 0819 04/06/23 1358 04/06/23 2112 04/07/23 0602  BP: (!) 126/95 (!) 118/93 119/88 (!) 118/96  Pulse: 97 96 (!) 102 63  Resp:  18 18 18   Temp:  97.8 F (  36.6 C) 98.4 F (36.9 C) 98.2 F (36.8 C)  TempSrc:  Oral    SpO2: 94% 97% 98% 97%  Weight:      Height:        Intake/Output Summary (Last 24 hours) at 04/07/2023 0751 Last data filed at 04/07/2023 0320 Gross per 24 hour  Intake 170 ml  Output --  Net 170 ml    Filed Weights   04/03/23 1629 04/04/23 0542  Weight: 79.4 kg 74.2 kg    Examination:  General: On room  air.  No distress ENT/neck: No thyromegaly.  JVD is not elevated  respiratory: Decreased breath sounds at bases bilaterally with some crackles; no wheezing  CVS: S1-S2 heard, mild intermittent tachycardia present Abdominal: Soft, tender mildly; slightly distended; no organomegaly, bowel sounds are heard Extremities: Trace lower extremity edema; no cyanosis  CNS: Awake and alert.  No focal neurologic deficit.  Moves extremities Lymph: No obvious lymphadenopathy Skin: No obvious ecchymosis/lesions  psych: Not agitated currently.  Flat affect.   Musculoskeletal: No obvious joint swelling/deformity   Data Reviewed: I have personally reviewed following labs and imaging studies  CBC: Recent Labs  Lab 04/03/23 1903 04/04/23 0746 04/06/23 0557  WBC 8.2 6.3 6.1  NEUTROABS 5.6  --   --   HGB 15.4* 14.3 14.2  HCT 45.3 41.9 41.6  MCV 86.3 86.9 86.0  PLT 147* 122* 126*    Basic Metabolic Panel: Recent Labs  Lab 04/03/23 1903 04/04/23 0746 04/06/23 0557  NA 130* 131* 131*  K 3.9 3.7 3.7  CL 97* 101 101  CO2 20* 20* 22  GLUCOSE 305* 297* 320*  BUN 34* 24* 23  CREATININE 1.31* 0.94 1.17*  CALCIUM 8.7* 8.2* 8.3*    GFR: Estimated Creatinine Clearance: 55 mL/min (A) (by C-G formula based on SCr of 1.17 mg/dL (H)). Liver Function Tests: Recent Labs  Lab 04/03/23 1903 04/04/23 0746  AST 15 13*  ALT 19 17  ALKPHOS 68 63  BILITOT 0.5 0.7  PROT 7.5 6.4*  ALBUMIN 4.3 3.9    Recent Labs  Lab 04/03/23 1903  LIPASE 63*    No results for input(s): "AMMONIA" in the last 168 hours. Coagulation Profile: Recent Labs  Lab 04/03/23 2310 04/04/23 0746 04/05/23 0121 04/06/23 0557  INR 3.6* 3.4* 1.3* 1.0    Cardiac Enzymes: No results for input(s): "CKTOTAL", "CKMB", "CKMBINDEX", "TROPONINI" in the last 168 hours. BNP (last 3 results) Recent Labs    08/24/22 1555 12/01/22 1629  PROBNP 2,927* 2,487*    HbA1C: No results for input(s): "HGBA1C" in the last 72  hours.  CBG: Recent Labs  Lab 04/06/23 0752 04/06/23 1109 04/06/23 1611 04/06/23 2115 04/07/23 0720  GLUCAP 314* 404* 495* 338* 389*    Lipid Profile: No results for input(s): "CHOL", "HDL", "LDLCALC", "TRIG", "CHOLHDL", "LDLDIRECT" in the last 72 hours. Thyroid Function Tests: No results for input(s): "TSH", "T4TOTAL", "FREET4", "T3FREE", "THYROIDAB" in the last 72 hours. Anemia Panel: No results for input(s): "VITAMINB12", "FOLATE", "FERRITIN", "TIBC", "IRON", "RETICCTPCT" in the last 72 hours. Sepsis Labs: No results for input(s): "PROCALCITON", "LATICACIDVEN" in the last 168 hours.  Recent Results (from the past 240 hour(s))  Urine Culture (for pregnant, neutropenic or urologic patients or patients with an indwelling urinary catheter)     Status: Abnormal   Collection Time: 04/03/23 11:16 PM   Specimen: Urine, Clean Catch  Result Value Ref Range Status   Specimen Description   Final    URINE, CLEAN CATCH Performed at [employer]  [facility], [address], [place], [place] [address]    Special Requests   Final    NONE Performed at [facility], [address] [place], [place] [address]    Culture >=100,000 COLONIES/mL ESCHERICHIA COLI (A)  Final   Report Status 04/06/2023 FINAL  Final   Organism ID, Bacteria ESCHERICHIA COLI (A)  Final      Susceptibility   Escherichia coli - MIC*    AMPICILLIN >=32 RESISTANT Resistant     CEFAZOLIN <=4 SENSITIVE Sensitive     CEFEPIME <=0.12 SENSITIVE Sensitive     CEFTRIAXONE <=0.25 SENSITIVE Sensitive     CIPROFLOXACIN >=4 RESISTANT Resistant     GENTAMICIN <=1 SENSITIVE Sensitive     IMIPENEM <=0.25 SENSITIVE Sensitive     NITROFURANTOIN <=16 SENSITIVE Sensitive     TRIMETH/SULFA >=320 RESISTANT Resistant     AMPICILLIN/SULBACTAM 16 INTERMEDIATE Intermediate     PIP/TAZO <=4 SENSITIVE Sensitive     * >=100,000 COLONIES/mL ESCHERICHIA COLI         Radiology Studies: CT CHEST W  CONTRAST  Result Date: 04/05/2023 CLINICAL DATA:  Lung nodule EXAM: CT CHEST WITH CONTRAST TECHNIQUE: Multidetector CT imaging of the chest was performed during intravenous contrast administration. RADIATION DOSE REDUCTION: This exam was performed according to the departmental dose-optimization program which includes automated exposure control, adjustment of the mA and/or kV according to patient size and/or use of iterative reconstruction technique. CONTRAST:  75mL OMNIPAQUE IOHEXOL 300 MG/ML  SOLN COMPARISON:  Chest CT dated May 25, 2022 FINDINGS: Cardiovascular: Normal heart size. No pericardial effusion. Normal caliber thoracic aorta with mild atherosclerotic disease. Prior tricuspid and mitral valve replacements. No suspicious filling defects of the pulmonary arteries. Mediastinum/Nodes: Esophagus and thyroid are unremarkable. No enlarged lymph nodes seen in the chest. Lungs/Pleura: Central airways are patent. No consolidation, pleural effusion or pneumothorax. Small solid pulmonary nodules. Reference solid pulmonary nodule of the left lower lobe measuring 9 x 6 mm. Additional reference solid pulmonary nodule of the right lower lobe measuring 4 mm on series 7, image 110. Upper Abdomen: Partially visualized pancreatic tail mass and hypoattenuating liver lesions, better evaluated on recently performed MRI of the abdomen and CT of the abdomen and pelvis. Musculoskeletal: Prior median sternotomy. No aggressive appearing osseous lesions. IMPRESSION: 1. Nonspecific small solid pulmonary nodules, largest of the left lower lobe measures 9 x 6 mm. Recommend short-term follow-up chest CT in 3 months for further evaluation. 2. No evidence nodal metastatic disease in the chest. 3. Partially visualized pancreatic tail mass and hypoattenuating liver lesions, better evaluated on recently performed MRI of the abdomen and CT of the abdomen and pelvis. 4. Aortic Atherosclerosis (ICD10-I70.0). Electronically Signed   By: Allegra Lai M.D.   On: 04/05/2023 16:21        Scheduled Meds:  ARIPiprazole  2.5 mg Oral Daily   atorvastatin  20 mg Oral Daily   buPROPion  300 mg Oral q AM   diltiazem  300 mg Oral Daily   FLUoxetine  20 mg Oral Daily   furosemide  40 mg Oral Daily   gabapentin  300 mg Oral TID   insulin aspart  0-15 Units Subcutaneous TID WC   insulin aspart  0-5 Units Subcutaneous QHS   insulin glargine-yfgn  20 Units Subcutaneous Daily   lubiprostone  24 mcg Oral BID WC   metoprolol tartrate  12.5 mg Oral BID   nicotine  7 mg Transdermal Daily   pantoprazole  40 mg  Oral Daily   sodium chloride flush  3 mL Intravenous Q12H   traZODone  200 mg Oral QHS   vortioxetine HBr  10 mg Oral Daily   Continuous Infusions:   ceFAZolin (ANCEF) IV 1 g (04/07/23 0546)          Glade Lloyd, MD [facility] 04/07/2023, 7:51 AM

## 2023-04-07 NOTE — Interval H&P Note (Signed)
History and Physical Interval Note:  04/07/2023 4:26 PM  [name]  has presented today for surgery, with the diagnosis of Pancreatic mass.  The various methods of treatment have been discussed with the patient and family. After consideration of risks, benefits and other options for treatment, the patient has consented to  Procedure(s): ESOPHAGEAL ENDOSCOPIC ULTRASOUND (EUS) RADIAL (N/A) as a surgical intervention.  The patient's history has been reviewed, patient examined, no change in status, stable for surgery.  I have reviewed the patient's chart and labs.  Questions were answered to the patient's satisfaction.     The risks of an EUS including intestinal perforation, bleeding, infection, aspiration, and medication effects were discussed as was the possibility it may not give a definitive diagnosis if a biopsy is performed.  When a biopsy of the pancreas is done as part of the EUS, there is an additional risk of pancreatitis at the rate of about 1-2%.  It was explained that procedure related pancreatitis is typically mild, although it can be severe and even life threatening, which is why we do not perform random pancreatic biopsies and only biopsy a lesion/area we feel is concerning enough to warrant the risk.    [employer]

## 2023-04-07 NOTE — Anesthesia Postprocedure Evaluation (Signed)
Anesthesia Post Note  Patient: [name]  Procedure(s) Performed: ESOPHAGEAL ENDOSCOPIC ULTRASOUND (EUS) RADIAL BIOPSY FINE NEEDLE ASPIRATION (FNA) LINEAR ESOPHAGOGASTRODUODENOSCOPY (EGD) WITH PROPOFOL     Patient location during evaluation: PACU Anesthesia Type: MAC Level of consciousness: awake and alert Pain management: pain level controlled Vital Signs Assessment: post-procedure vital signs reviewed and stable Respiratory status: spontaneous breathing, nonlabored ventilation, respiratory function stable and patient connected to nasal cannula oxygen Cardiovascular status: stable and blood pressure returned to baseline Postop Assessment: no apparent nausea or vomiting Anesthetic complications: no   No notable events documented.  Last Vitals:  Vitals:   04/07/23 1815 04/07/23 1830  BP: (!) 143/104 (!) 130/95  Pulse: 76 87  Resp: 10 10  Temp:    SpO2: 98% 97%    Last Pain:  Vitals:   04/07/23 1852  TempSrc:   PainSc: 7                  Collene Schlichter

## 2023-04-07 NOTE — Telephone Encounter (Signed)
Pharmacy Patient Advocate Encounter  Insurance verification completed.    The patient is insured through [employer]   The patient is currently admitted and ran test claims for the following: Lantus, Hospital doctor.  Copays and coinsurance results were relayed to Inpatient clinical team.

## 2023-04-07 NOTE — Anesthesia Preprocedure Evaluation (Signed)
Anesthesia Evaluation  Patient identified by MRN, date of birth, ID band Patient awake    Reviewed: Allergy & Precautions, NPO status , Patient's Chart, lab work & pertinent test results  Airway Mallampati: II  TM Distance: >3 FB Neck ROM: Full    Dental  (+) Dental Advisory Given, Poor Dentition, Missing   Pulmonary COPD, Current Smoker and Patient abstained from smoking.   Pulmonary exam normal breath sounds clear to auscultation       Cardiovascular hypertension, Pt. on medications +CHF  + dysrhythmias Atrial Fibrillation + Valvular Problems/Murmurs (s/p MVR) MR  Rhythm:Regular Rate:Normal  s/p MV and TV repair 04/2022   Neuro/Psych  PSYCHIATRIC DISORDERS Anxiety Depression     Neuromuscular disease    GI/Hepatic ,GERD  Medicated,,(+)     substance abuse  , Hepatitis -, C Pancreatic mass   Endo/Other  diabetes, Type 2    Renal/GU Renal InsufficiencyRenal disease     Musculoskeletal  (+) Arthritis ,  Fibromyalgia -  Abdominal   Peds  Hematology  (+) Blood dyscrasia (Thrombocytopenia; coumadin)   Anesthesia Other Findings Day of surgery medications reviewed with the patient.  Reproductive/Obstetrics                              Anesthesia Physical Anesthesia Plan  ASA: 4  Anesthesia Plan: MAC   Post-op Pain Management:    Induction: Intravenous  PONV Risk Score and Plan: 1 and TIVA and Treatment may vary due to age or medical condition  Airway Management Planned: Natural Airway and Simple Face Mask  Additional Equipment:   Intra-op Plan:   Post-operative Plan:   Informed Consent: I have reviewed the patients History and Physical, chart, labs and discussed the procedure including the risks, benefits and alternatives for the proposed anesthesia with the patient or authorized representative who has indicated his/her understanding and acceptance.     Dental advisory  given  Plan Discussed with: CRNA and Anesthesiologist  Anesthesia Plan Comments:          Anesthesia Quick Evaluation

## 2023-04-07 NOTE — Care Management Important Message (Signed)
Important Message  Patient Details IM Letter given to the Patient. Name: [name] MRN: [id number] Date of Birth: May 09, 1961   Medicare Important Message Given:  Yes     Caren Macadam 04/07/2023, 10:57 AM

## 2023-04-07 NOTE — TOC Benefit Eligibility Note (Signed)
Patient Product/process development scientist completed.    The patient is currently admitted and upon discharge could be taking Lantus Pen.  The current 30 day co-pay is $0.00.   The patient is currently admitted and upon discharge could be taking Humalog Pen.  The current 30 day co-pay is $0.00.   The patient is insured through [employer]   This test claim was processed through [facility]- copay amounts may vary at other pharmacies due to pharmacy/plan contracts, or as the patient moves through the different stages of their insurance plan.  Roland Earl, CPHT Pharmacy Patient Advocate Specialist [facility] Patient Advocate Team Direct Number: [phone]  Fax: [phone]

## 2023-04-07 NOTE — Inpatient Diabetes Management (Signed)
Inpatient Diabetes Program Recommendations  AACE/ADA: New Consensus Statement on Inpatient Glycemic Control (2015)  Target Ranges:  Prepandial:   less than 140 mg/dL      Peak postprandial:   less than 180 mg/dL (1-2 hours)      Critically ill patients:  140 - 180 mg/dL   Lab Results  Component Value Date   GLUCAP 273 (H) 04/07/2023   HGBA1C 8.7 (H) 04/04/2023    Review of Glycemic Control  Diabetes history: DM2 Outpatient Diabetes medications: Farxiga 10 QD, tradjenta 5 mg QD Current orders for Inpatient glycemic control: Semglee 25 units BID, Novolog 0-20 Q4H  HgbA1C - 8.7%  Inpatient Diabetes Program Recommendations:    For discharge:  Lantus Solostar pen 25 units QD Humalog Kwikpen 12 units BID   Educated patient on insulin pen use at home. Reviewed contents of insulin flexpen starter kit. Reviewed all steps if insulin pen including attachment of needle, 2-unit air shot, dialing up dose, giving injection, removing needle, disposal of sharps, storage of unused insulin, disposal of insulin etc. Patient able to provide successful return demonstration. Also reviewed troubleshooting with insulin pen. MD to give patient Rxs for insulin pens and insulin pen needles.  Answered all questions. Pt states she usually eats a late breakfast and then dinner meal.  Instructed to monitor blood sugars 2-3x/day.  Follow.  Thank you. Ailene Ards, RD, LDN, CDCES Inpatient Diabetes Coordinator [phone]

## 2023-04-08 DIAGNOSIS — I4891 Unspecified atrial fibrillation: Secondary | ICD-10-CM

## 2023-04-08 DIAGNOSIS — K8689 Other specified diseases of pancreas: Secondary | ICD-10-CM | POA: Diagnosis not present

## 2023-04-08 DIAGNOSIS — N183 Chronic kidney disease, stage 3 unspecified: Secondary | ICD-10-CM

## 2023-04-08 LAB — CBC
HCT: 41.2 % (ref 36.0–46.0)
Hemoglobin: 13.9 g/dL (ref 12.0–15.0)
MCH: 29 pg (ref 26.0–34.0)
MCHC: 33.7 g/dL (ref 30.0–36.0)
MCV: 86 fL (ref 80.0–100.0)
Platelets: 108 10*3/uL — ABNORMAL LOW (ref 150–400)
RBC: 4.79 MIL/uL (ref 3.87–5.11)
RDW: 15.7 % — ABNORMAL HIGH (ref 11.5–15.5)
WBC: 5.2 10*3/uL (ref 4.0–10.5)
nRBC: 0 % (ref 0.0–0.2)

## 2023-04-08 LAB — BASIC METABOLIC PANEL
Anion gap: 12 (ref 5–15)
BUN: 17 mg/dL (ref 8–23)
CO2: 24 mmol/L (ref 22–32)
Calcium: 8.2 mg/dL — ABNORMAL LOW (ref 8.9–10.3)
Chloride: 99 mmol/L (ref 98–111)
Creatinine, Ser: 1.12 mg/dL — ABNORMAL HIGH (ref 0.44–1.00)
GFR, Estimated: 55 mL/min — ABNORMAL LOW (ref >60)
Glucose, Bld: 73 mg/dL (ref 70–99)
Potassium: 2.8 mmol/L — ABNORMAL LOW (ref 3.5–5.1)
Sodium: 135 mmol/L (ref 135–145)

## 2023-04-08 LAB — MAGNESIUM: Magnesium: 1.5 mg/dL — ABNORMAL LOW (ref 1.7–2.4)

## 2023-04-08 LAB — GLUCOSE, CAPILLARY
Glucose-Capillary: 105 mg/dL — ABNORMAL HIGH (ref 70–99)
Glucose-Capillary: 228 mg/dL — ABNORMAL HIGH (ref 70–99)
Glucose-Capillary: 249 mg/dL — ABNORMAL HIGH (ref 70–99)
Glucose-Capillary: 261 mg/dL — ABNORMAL HIGH (ref 70–99)
Glucose-Capillary: 285 mg/dL — ABNORMAL HIGH (ref 70–99)
Glucose-Capillary: 309 mg/dL — ABNORMAL HIGH (ref 70–99)
Glucose-Capillary: 49 mg/dL — ABNORMAL LOW (ref 70–99)

## 2023-04-08 LAB — PROTIME-INR
INR: 1 (ref 0.8–1.2)
Prothrombin Time: 12.7 seconds (ref 11.4–15.2)

## 2023-04-08 MED ORDER — POTASSIUM CHLORIDE CRYS ER 20 MEQ PO TBCR
40.0000 meq | EXTENDED_RELEASE_TABLET | ORAL | Status: AC
  Administered 2023-04-08 (×2): 40 meq via ORAL
  Filled 2023-04-08 (×2): qty 2

## 2023-04-08 MED ORDER — MAGNESIUM SULFATE 2 GM/50ML IV SOLN
2.0000 g | Freq: Once | INTRAVENOUS | Status: AC
  Administered 2023-04-08: 2 g via INTRAVENOUS
  Filled 2023-04-08: qty 50

## 2023-04-08 MED ORDER — WARFARIN - PHARMACIST DOSING INPATIENT: Freq: Every day | Status: DC

## 2023-04-08 MED ORDER — WARFARIN SODIUM 4 MG PO TABS
4.0000 mg | ORAL_TABLET | Freq: Once | ORAL | Status: AC
  Administered 2023-04-08: 4 mg via ORAL
  Filled 2023-04-08: qty 1

## 2023-04-08 MED ORDER — INSULIN GLARGINE-YFGN 100 UNIT/ML ~~LOC~~ SOLN
20.0000 [IU] | Freq: Two times a day (BID) | SUBCUTANEOUS | Status: DC
  Administered 2023-04-08 (×2): 20 [IU] via SUBCUTANEOUS
  Filled 2023-04-08 (×3): qty 0.2

## 2023-04-08 NOTE — TOC Initial Note (Signed)
Transition of Care [facility]) - Initial/Assessment Note    Patient Details  Name: [name] MRN: [id number] Date of Birth: 05-23-1961  Transition of [facility]) CM/SW Contact:    Beckie Busing, RN Phone Number:[phone]  04/08/2023, 2:27 PM  Clinical Narrative:                 TOC following patient with high risk for readmission. CM at bedside introduces self and explained high risk assessment. Patient states that she is from home where she functions independently. Patient confirms that PCP is  Chrys Racer, MD as listed in the medical record. Patient states that she does follow up with PCP on a regular basis and is active with her care. [facility] Patient states that she does have access to medication and that they are affordable. Patient reports that she has transportation to appointments. Patient reports that she has O2, wheelchair , cane and walker. Patient confirms"I have it all." Patient does not have any home health services. TOC will continue to follow for any discharge needs.   Expected Discharge Plan: Home/Self Care Barriers to Discharge: Continued Medical Work up   Patient Goals and CMS Choice Patient states their goals for this hospitalization and ongoing recovery are:: Wants to get better to go home CMS Medicare.gov Compare Post Acute Care list provided to::  (n/a) Choice offered to / list presented to : NA [place] ownership interest in [facility]provided to::  (n/a)    Expected Discharge Plan and Services In-house Referral: NA Discharge Planning Services: CM Consult Post Acute Care Choice: NA Living arrangements for the past 2 months: Apartment                 DME Arranged: N/A         HH Arranged: NA HH Agency: NA        Prior Living Arrangements/Services Living arrangements for the past 2 months: Apartment Lives with:: Self Patient language and need for interpreter reviewed:: Yes Do you feel safe going back to the  place where you live?: Yes      Need for Family Participation in Patient Care: No (Comment) Care giver support system in place?: Yes (comment) Current home services: DME (Home O2, wheelchair, cane, walker,) Criminal Activity/Legal Involvement Pertinent to Current Situation/Hospitalization: No - Comment as needed  Activities of Daily Living Home Assistive Devices/Equipment: Cane (specify quad or straight), Wheelchair, Environmental consultant (specify type) ADL Screening (condition at time of admission) Patient's cognitive ability adequate to safely complete daily activities?: Yes Is the patient deaf or have difficulty hearing?: No Does the patient have difficulty seeing, even when wearing glasses/contacts?: No Does the patient have difficulty concentrating, remembering, or making decisions?: No Patient able to express need for assistance with ADLs?: Yes Does the patient have difficulty dressing or bathing?: No Independently performs ADLs?: Yes (appropriate for developmental age) Does the patient have difficulty walking or climbing stairs?: Yes Weakness of Legs: None Weakness of Arms/Hands: None  Permission Sought/Granted Permission sought to share information with : Family Supports Permission granted to share information with : No              Emotional Assessment Appearance:: Appears stated age Attitude/Demeanor/Rapport: Gracious Affect (typically observed): Accepting Orientation: : Oriented to Self, Oriented to Place, Oriented to  Time, Oriented to Situation Alcohol / Substance Use: Not Applicable Psych Involvement: No (comment)  Admission diagnosis:  Pancreatic lesion [K86.9] Mass of pancreas [K86.89] Urinary tract infection with hematuria, site unspecified [  N39.0, R31.9] Pancreatic mass [K86.89] Patient Active Problem List   Diagnosis Date Noted   Pancreatic mass 04/04/2023   Mass of pancreas 04/03/2023   Chronic kidney disease, stage 3a (HCC) 04/03/2023   Pulmonary nodules 04/03/2023    Mixed hyperlipidemia 02/28/2023   Rheumatic disease of heart valve 11/19/2022   COPD (chronic obstructive pulmonary disease) (HCC) 08/05/2022   Anxiety and depression 08/05/2022   QT prolongation 08/05/2022   Benign neoplasm of ascending colon    AVM (arteriovenous malformation) of colon without hemorrhage    Benign neoplasm of cecum    Benign neoplasm of transverse colon    Normocytic anemia 07/23/2022   Pain due to onychomycosis of toenails of both feet 06/23/2022   Encounter for therapeutic drug monitoring 06/11/2022   Hypomagnesemia 05/24/2022   Long term current use of anticoagulant therapy 05/17/2022   S/P MVR/TVR  04/19/22 05/17/2022   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 05/17/2022   PAF (paroxysmal atrial fibrillation) (HCC)    Pain of left hand 01/01/2021   Nasal vestibulitis 04/25/2020   FH: CHF (congestive heart failure) 04/05/2020   Acquired hallux interphalangeus 02/26/2020   Hallux rigidus 02/26/2020   Retained orthopedic hardware 02/26/2020   Mechanical complication associated with orthopedic device (HCC) 08/31/2019   Hypercholesterolemia 08/17/2019   Lateral epicondylitis of right elbow 03/31/2018   Arthritis of knee 03/09/2018   Arthritis of foot, degenerative 02/28/2018   Hallux valgus (acquired), left foot 02/28/2018   Long term current use of oral hypoglycemic drug 01/16/2018   Pharyngeal dysphagia 12/21/2017   Left thyroid nodule 12/21/2017   History of colon polyps 09/21/2017   History of atrial fibrillation 08/23/2017   GERD (gastroesophageal reflux disease) 08/23/2017   Presbyopia of both eyes 07/15/2017   Nuclear sclerotic cataract of both eyes 07/15/2017   Dry eye syndrome of both lacrimal glands 07/15/2017   Acquired trigger finger of left ring finger 05/04/2017   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 01/17/2017   Paroxysmal atrial fibrillation (HCC) 12/30/2016   Osteoarthritis 12/30/2016   NSAID long-term use 12/30/2016   DDD  (degenerative disc disease), lumbar 11/26/2015   Facet syndrome, lumbar 11/26/2015   Greater trochanteric bursitis 11/26/2015   Diabetic neuropathy (HCC) 11/26/2015   Insomnia 11/11/2015   PTSD (post-traumatic stress disorder) 11/11/2015   GAD (generalized anxiety disorder) 11/11/2015   Severe episode of recurrent major depressive disorder, without psychotic features (HCC) 11/11/2015   Tobacco use 06/27/2012   Sacroiliac joint dysfunction 05/18/2012   Right low back pain 04/19/2012   Cervical neck pain with evidence of disc disease 02/11/2012   Trochanteric bursitis of right hip 02/11/2012   Hematochezia 04/29/2011   Plantar fasciitis 03/11/2011   Acute cystitis 06/19/2010   INSOMNIA 01/18/2008   Chronic pain syndrome 12/15/2007   DIABETIC PERIPHERAL NEUROPATHY 05/08/2007   HEPATITIS C 02/27/2007   Type 2 diabetes mellitus with hyperlipidemia (HCC) 02/27/2007   Hypertension associated with diabetes (HCC) 02/27/2007   PCP:  Chrys Racer, MD Pharmacy:   [facility] [facility] [address] - [place], [place] - [address] [facility] [place] [address] Phone: [phone] Fax: [phone]  [facility] - [place], [place] - [address] [place] [address] Phone: [phone] Fax: [phone]  [id number] - [place], [place] - [address] AT [facility] [address] [place] [address] Phone: [phone] Fax: [phone]  [id number] [place], [place] - [address] [place]  [place] [address] Phone: [phone] Fax: [phone]     Social Determinants of Health (SDOH) Social History: SDOH Screenings   Food Insecurity: No Food Insecurity (04/04/2023)  Housing: Low Risk  (04/04/2023)  Transportation Needs: No Transportation Needs (04/04/2023)  Utilities: At Risk (04/04/2023)  Tobacco Use: High Risk (04/07/2023)   SDOH Interventions:      Readmission Risk Interventions    04/08/2023    2:15 PM 08/06/2022    2:13 PM  Readmission Risk Prevention Plan  Transportation Screening Complete Complete  Medication Review (RN Care Manager)  Complete  PCP or Specialist appointment within 3-5 days of discharge  Complete  HRI or Home Care Consult Complete Complete  SW Recovery Care/Counseling Consult Complete Complete  Palliative Care Screening Not Applicable Not Applicable  Skilled Nursing Facility Not Applicable Not Applicable

## 2023-04-08 NOTE — Progress Notes (Addendum)
Progress Note   Subjective  Chief Complaint: Pancreatic mass  EUS with FNA 04/07/2023 Cytology not available at time of procedure, biopsies are pending, but suspicious for adenocarcinoma  Today, the patient tells me that she tolerated a good breakfast including grits and bacon excetra, continues with a dull epigastric abdominal pain some better with a heating pad, but no nausea or vomiting or increase in pain overnight.  Doing well.  She wants me to call both her brother and her sister.   Objective   Vital signs in last 24 hours: Temp:  [97.5 F (36.4 C)-98.3 F (36.8 C)] 98.3 F (36.8 C) (05/03 0407) Pulse Rate:  [68-100] 100 (05/03 0407) Resp:  [10-18] 18 (05/03 0407) BP: (111-143)/(80-104) 111/81 (05/03 0407) SpO2:  [96 %-100 %] 96 % (05/03 0407) Weight:  [81.2 kg] 81.2 kg (05/02 1544) Last BM Date : 04/06/23 General: AA female in NAD Heart:  Regular rate and rhythm; no murmurs Lungs: Respirations even and unlabored, lungs CTA bilaterally Abdomen:  Soft, mild epigastric ttp and nondistended. Normal bowel sounds. Psych:  Cooperative. Normal mood and affect.  Intake/Output from previous day: 05/02 0701 - 05/03 0700 In: 593 [P.O.:240; I.V.:353] Out: -    Lab Results: Recent Labs    04/06/23 0557 04/07/23 0639 04/08/23 0705  WBC 6.1 4.5 5.2  HGB 14.2 12.9 13.9  HCT 41.6 38.0 41.2  PLT 126* 101* 108*   BMET Recent Labs    04/06/23 0557 04/07/23 0639 04/08/23 0705  NA 131* 132* 135  K 3.7 3.4* 2.8*  CL 101 99 99  CO2 22 23 24   GLUCOSE 320* 369* 73  BUN 23 20 17   CREATININE 1.17* 1.02* 1.12*  CALCIUM 8.3* 7.8* 8.2*   LFT No results for input(s): "PROT", "ALBUMIN", "AST", "ALT", "ALKPHOS", "BILITOT", "BILIDIR", "IBILI" in the last 72 hours. PT/INR Recent Labs    04/07/23 0639 04/08/23 0705  LABPROT 12.9 12.7  INR 1.0 1.0     Assessment / Plan:   Assessment: 1.  Pancreatic mass: 9.9 x 2.9 cm masslike lesion in the tail of the pancreas on CT,  findings concerning for pancreatic adenocarcinoma, LFTs within normal limits, MRI with large hypoenhancing mass involving the body and tail of the pancreas, compatible with pancreatic adenocarcinoma, status post EUS with FNA 04/07/2023 biopsies pending 2.  A-fib 3.  History of rheumatic heart disease status post mitral valve and tricuspid valve repair in May 2023/chronic diastolic CHF 4.  CKD stage III 5.  Acute cystitis  Plan: 1.  EUS with Dr. Meridee Score 04/07/2023 with pancreatic mass suspicious for adenocarcinoma but biopsies pending.  Discussed this again with the patient today and went over images excetra and report with her. 2.  Patient tolerated her diet well today, continue and increase as tolerated from here 3.  We will let the patient know when biopsies return 4.  Patient requested me to call her brother Jannelly Laflair as well as her Sister Dellia Nims. Attempted to call both and there was no answer.  Thank you for your kind consultation, we will sign off.   LOS: 4 days   Unk Lightning  04/08/2023, 9:42 AM     Attending physician's note   I have taken history, reviewed the chart and examined the patient. I performed a substantive portion of this encounter, including complete performance of at least one of the key components, in conjunction with the APP. I agree with the Advanced Practitioner's note, impression and recommendations.   EUS  w/t FNA 5/2.  Cytology/Bx pending.  Expected to be back early next week. CA 19-9 77 (N<35) IgG4 pending  Will follow biopsies and make appropriate referrals thereafter Will sign off for now. D/C home when okay with med svc.   Edman Circle, MD [facility] [phone]

## 2023-04-08 NOTE — Progress Notes (Signed)
ANTICOAGULATION CONSULT NOTE  Pharmacy Consult for warfarin Indication: atrial fibrillation  Allergies  Allergen Reactions   Norco [Hydrocodone-Acetaminophen] Itching and Other (See Comments)    Tolerates Oxycodone    Patient Measurements: Height: 5\' 11"  (180.3 cm) Weight: 81.2 kg (179 lb) IBW/kg (Calculated) : Nicole.8  Labs: Recent Labs    04/06/23 0557 04/07/23 0639  HGB 14.2 12.9  HCT 41.6 38.0  PLT 126* 101*  LABPROT 12.8 12.9  INR 1.0 1.0  CREATININE 1.17* 1.02*    Estimated Creatinine Clearance: 63.1 mL/min (A) (by C-G formula based on SCr of 1.02 mg/dL (H)).  Assessment: 62 yo [name] on warfarin PTA for hx Afib admitted for possible pancreatic adenocarcinoma . INR elevated on admission, likely a result of her presenting symptoms of abdominal pain, nausea and reduced appetite. She was given Vit K on 4/29 to reverse INR for EUS. She is POD #1 and pharmacy is consulted to resume warfarin. Will resume at a higher dose to accelerate therepautic range with eventual goal of resuming home dose. Noted potential drug interaction while on cefazolin  Last dose of warfarin: 4/27. Home dose: warfarin 2.5mg  daily x 5mg  Sunday Ecoli UTI on Cefazolin (day 6 total antiboitics)  Goal of Therapy:  INR 2-3   Plan:  Warfarin 4mg  po today PT/INR daily Monitor INR trend and any signs/symptoms of bleeding  Kara Dies, PharmD, BCPS Clinical Pharmacist 04/08/2023 8:18 AM

## 2023-04-08 NOTE — Progress Notes (Signed)
PROGRESS NOTE    [name]  ZOX:[id number] DOB: 1961-11-06 DOA: 04/03/2023 PCP: Chrys Racer, MD   Brief Narrative:   62 y.o. female with medical history significant for rheumatic heart disease s/p MV and TV repair 04/2022, history of pericardial effusion/tamponade s/p pericardiocentesis 05/2022, PAF on Coumadin, CKD stage IIIa, COPD, T2DM, HTN, tobacco use presented with abdominal pain and dysuria.  She was found to have UTI along with possible mass in the pancreatic tail and CT of abdomen.  She was started on IV antibiotics.  GI was consulted.  She underwent EUS on 04/07/2023  Assessment & Plan:   Mass in the pancreatic body and tail with hypodensities in the liver -MRI of abdomen showed findings suggestive of possible pancreatic adenocarcinoma with possible liver and nodal metastases.  CT chest with contrast showed nonspecific small solid pulmonary nodules: Recommend short-term follow-up CT chest in 3 months -GI following: Status post EUS on 04/07/2023.  Pancreatic body/tail mass identified which was biopsied: Pathology pending. -Prior hospitalist communicated with Dr. Bertis Ruddy: Outpatient follow-up arranged for next week with Dr. Grayland Ormond. coli acute cystitis -Antibiotics has been switched to Ancef  Paroxysmal A-fib -Rate controlled.  Continue Cardizem and metoprolol.  Coumadin on hold.  INR pending today.  Patient had to be given vitamin K during the hospitalization to decrease INR below 2. -Resume Coumadin from today as GI has given clearance.  Monitor INR.  History of rheumatic heart disease status post mitral valve and tricuspid valve repair in May 2023 Chronic diastolic CHF -Prior EF of 60 to 65%.  Currently compensated.  Strict input and output.  Daily weights.  Continue Lasix and metoprolol.  Outpatient follow-up with cardiology/Dr. Anne Fu  Essential hypertension Hyperlipidemia -Continue antihypertensives as above.  Continue statin  Diabetes mellitus type 2 with  hyperglycemia and hypoglycemia -A1c 8.7.  Farxiga and linagliptin on hold.  Patient had an episode of hypoglycemia this morning.  Decrease long-acting insulin to 20 units twice a day.  Continue CBGs with SSI.  Chronic kidney disease stage IIIa -Creatinine stable.    Hyponatremia -Labs pending for today.  Thrombocytopenia -Questionable cause.  No signs of bleeding.  Monitor  History of depression and anxiety -Continue current regimen.  Outpatient follow-up with psychiatry  Pulmonary nodules -Plan as above  History of COPD -Stable    DVT prophylaxis: Resume Coumadin from today  code Status: Full Family Communication: None at bedside Disposition Plan: Status is: Inpatient Remains inpatient appropriate because: Of severity of illness.  Consultants: GI  Procedures:  EUS on 04/07/2023 Impression:               EGD impression:                           - White nummular lesions in esophageal mucosa.                            Biopsied.                           - Z-line irregular, 40 cm from the incisors.                           - 2 cm hiatal hernia.                           -  Erythematous mucosa in the stomach. Biopsied.                           - No gross lesions in the duodenal bulb, in the                            first portion of the duodenum and in the second                            portion of the duodenum.                           - Normal major papilla.                           EUS impression:                           - A mass was identified in the pancreatic                            body/tail. Cytology results are pending. However,                            the endosonographic appearance is suspicious for                            adenocarcinoma with distal pancreatic duct                            obstruction and dilation noted. This was staged T4                            N0 Mx by endosonographic criteria as a result of                             the vasculature. The staging applies if malignancy                            is confirmed. Fine needle biopsy performed.                           - There was no sign of significant pathology in the                            common bile duct and in the gallbladder.                           - No malignant-appearing lymph nodes were                            visualized in the celiac region (level 20) and  peripancreatic region. Moderate Sedation:      Not Applicable - Patient had care per Anesthesia. Recommendation:           - The patient will be observed post-procedure,                            until all discharge criteria are met.                           - Return patient to hospital ward for ongoing care.                           - Advance diet as tolerated.                           - Observe patient's clinical course.                           - Await cytology results and await path results.                           - Although most concerning is the potential of this                            being a malignancy, will send IgG4 levels tomorrow.                           - May restart anticoagulation with heparin drip if                            needed at 6 AM tomorrow without bolus otherwise                            wait a full 24 hours.                           - May restart warfarin tomorrow. If starting a NOAC                            wait 48 hours.                           - Further recommendations as per Medical/GI teams.                           - The findings and recommendations were discussed                            with the patient.                           - The findings and recommendations were discussed                            with the patient's family. Antimicrobials:  Anti-infectives (From admission,  onward)    Start     Dose/Rate Route Frequency Ordered Stop   04/06/23 2200  ceFAZolin (ANCEF) IVPB 1 g/50 mL premix         1 g 100 mL/hr over 30 Minutes Intravenous Every 8 hours 04/06/23 1401     04/04/23 2200  cefTRIAXone (ROCEPHIN) 1 g in sodium chloride 0.9 % 100 mL IVPB  Status:  Discontinued        1 g 200 mL/hr over 30 Minutes Intravenous Every 24 hours 04/03/23 2345 04/06/23 1401   04/03/23 2230  cefTRIAXone (ROCEPHIN) 1 g in sodium chloride 0.9 % 100 mL IVPB        1 g 200 mL/hr over 30 Minutes Intravenous  Once 04/03/23 2223 04/03/23 2340       Subjective: Patient seen and examined at bedside.  Complains of intermittent abdominal pain.  No chest pain, shortness of breath, fever or vomiting reported.   Objective: Vitals:   04/07/23 1815 04/07/23 1830 04/07/23 1932 04/08/23 0407  BP: (!) 143/104 (!) 130/95 (!) 142/97 111/81  Pulse: 76 87 68 100  Resp: 10 10 18 18   Temp:   (!) 97.5 F (36.4 C) 98.3 F (36.8 C)  TempSrc:   Oral Oral  SpO2: 98% 97% 98% 96%  Weight:      Height:        Intake/Output Summary (Last 24 hours) at 04/08/2023 0801 Last data filed at 04/07/2023 1746 Gross per 24 hour  Intake 353 ml  Output --  Net 353 ml    Filed Weights   04/03/23 1629 04/04/23 0542 04/07/23 1544  Weight: 79.4 kg 74.2 kg 81.2 kg    Examination:  General: No acute distress.  On room air currently. ENT/neck: No elevated JVD or palpable neck masses noted  respiratory: Bilateral decreased sounds at bases with some scattered crackles  CVS: Rate mostly controlled; S1 and S2 are heard Abdominal: Soft, slightly tender; distended mildly; no organomegaly, bowel sounds are heard normally Extremities: No clubbing; mild lower extremity edema present  CNS: Alert and oriented.  No focal neurologic deficit.  Moving extremities Lymph: No palpable lymphadenopathy noted  skin: No obvious rashes/petechiae  psych: Extremely flat affect.  Currently not agitated.   Musculoskeletal: No obvious joint swelling/deformity   Data Reviewed: I have personally reviewed following labs and imaging  studies  CBC: Recent Labs  Lab 04/03/23 1903 04/04/23 0746 04/06/23 0557 04/07/23 0639  WBC 8.2 6.3 6.1 4.5  NEUTROABS 5.6  --   --   --   HGB 15.4* 14.3 14.2 12.9  HCT 45.3 41.9 41.6 38.0  MCV 86.3 86.9 86.0 85.6  PLT 147* 122* 126* 101*    Basic Metabolic Panel: Recent Labs  Lab 04/03/23 1903 04/04/23 0746 04/06/23 0557 04/07/23 0639  NA 130* 131* 131* 132*  K 3.9 3.7 3.7 3.4*  CL 97* 101 101 99  CO2 20* 20* 22 23  GLUCOSE 305* 297* 320* 369*  BUN 34* 24* 23 20  CREATININE 1.31* 0.94 1.17* 1.02*  CALCIUM 8.7* 8.2* 8.3* 7.8*  MG  --   --   --  1.6*    GFR: Estimated Creatinine Clearance: 63.1 mL/min (A) (by C-G formula based on SCr of 1.02 mg/dL (H)). Liver Function Tests: Recent Labs  Lab 04/03/23 1903 04/04/23 0746  AST 15 13*  ALT 19 17  ALKPHOS 68 63  BILITOT 0.5 0.7  PROT 7.5 6.4*  ALBUMIN 4.3 3.9    Recent Labs  Lab 04/03/23  1903  LIPASE 63*    No results for input(s): "AMMONIA" in the last 168 hours. Coagulation Profile: Recent Labs  Lab 04/03/23 2310 04/04/23 0746 04/05/23 0121 04/06/23 0557 04/07/23 0639  INR 3.6* 3.4* 1.3* 1.0 1.0    Cardiac Enzymes: No results for input(s): "CKTOTAL", "CKMB", "CKMBINDEX", "TROPONINI" in the last 168 hours. BNP (last 3 results) Recent Labs    08/24/22 1555 12/01/22 1629  PROBNP 2,927* 2,487*    HbA1C: No results for input(s): "HGBA1C" in the last 72 hours.  CBG: Recent Labs  Lab 04/07/23 2153 04/08/23 0017 04/08/23 0409 04/08/23 0723 04/08/23 0758  GLUCAP 252* 228* 285* 49* 105*    Lipid Profile: No results for input(s): "CHOL", "HDL", "LDLCALC", "TRIG", "CHOLHDL", "LDLDIRECT" in the last 72 hours. Thyroid Function Tests: No results for input(s): "TSH", "T4TOTAL", "FREET4", "T3FREE", "THYROIDAB" in the last 72 hours. Anemia Panel: No results for input(s): "VITAMINB12", "FOLATE", "FERRITIN", "TIBC", "IRON", "RETICCTPCT" in the last 72 hours. Sepsis Labs: No results for  input(s): "PROCALCITON", "LATICACIDVEN" in the last 168 hours.  Recent Results (from the past 240 hour(s))  Urine Culture (for pregnant, neutropenic or urologic patients or patients with an indwelling urinary catheter)     Status: Abnormal   Collection Time: 04/03/23 11:16 PM   Specimen: Urine, Clean Catch  Result Value Ref Range Status   Specimen Description   Final    URINE, CLEAN CATCH Performed at [facility], [address], [place], [place] [address]    Special Requests   Final    NONE Performed at [facility], [address] [place], [place] [address]    Culture >=100,000 COLONIES/mL ESCHERICHIA COLI (A)  Final   Report Status 04/06/2023 FINAL  Final   Organism ID, Bacteria ESCHERICHIA COLI (A)  Final      Susceptibility   Escherichia coli - MIC*    AMPICILLIN >=32 RESISTANT Resistant     CEFAZOLIN <=4 SENSITIVE Sensitive     CEFEPIME <=0.12 SENSITIVE Sensitive     CEFTRIAXONE <=0.25 SENSITIVE Sensitive     CIPROFLOXACIN >=4 RESISTANT Resistant     GENTAMICIN <=1 SENSITIVE Sensitive     IMIPENEM <=0.25 SENSITIVE Sensitive     NITROFURANTOIN <=16 SENSITIVE Sensitive     TRIMETH/SULFA >=320 RESISTANT Resistant     AMPICILLIN/SULBACTAM 16 INTERMEDIATE Intermediate     PIP/TAZO <=4 SENSITIVE Sensitive     * >=100,000 COLONIES/mL ESCHERICHIA COLI         Radiology Studies: No results found.      Scheduled Meds:  ARIPiprazole  2.5 mg Oral Daily   atorvastatin  20 mg Oral Daily   buPROPion  300 mg Oral q AM   diltiazem  300 mg Oral Daily   FLUoxetine  20 mg Oral Daily   furosemide  40 mg Oral Daily   gabapentin  300 mg Oral TID   insulin aspart  0-20 Units Subcutaneous Q4H   insulin glargine-yfgn  20 Units Subcutaneous BID   lubiprostone  24 mcg Oral BID WC   metoprolol tartrate  12.5 mg Oral BID   nicotine  7 mg Transdermal Daily   pantoprazole  40 mg Oral Daily   sodium chloride flush  3 mL Intravenous Q12H    traZODone  200 mg Oral QHS   vortioxetine HBr  10 mg Oral Daily   Continuous Infusions:   ceFAZolin (ANCEF) IV 1 g (04/08/23 9528)          Glade Lloyd, MD [facility]  04/08/2023, 8:01 AM

## 2023-04-09 DIAGNOSIS — K8689 Other specified diseases of pancreas: Secondary | ICD-10-CM | POA: Diagnosis not present

## 2023-04-09 LAB — CBC
HCT: 39.1 % (ref 36.0–46.0)
Hemoglobin: 13 g/dL (ref 12.0–15.0)
MCH: 29.1 pg (ref 26.0–34.0)
MCHC: 33.2 g/dL (ref 30.0–36.0)
MCV: 87.7 fL (ref 80.0–100.0)
Platelets: 110 10*3/uL — ABNORMAL LOW (ref 150–400)
RBC: 4.46 MIL/uL (ref 3.87–5.11)
RDW: 16 % — ABNORMAL HIGH (ref 11.5–15.5)
WBC: 5.5 10*3/uL (ref 4.0–10.5)
nRBC: 0 % (ref 0.0–0.2)

## 2023-04-09 LAB — PROTIME-INR
INR: 1 (ref 0.8–1.2)
Prothrombin Time: 13 seconds (ref 11.4–15.2)

## 2023-04-09 LAB — BASIC METABOLIC PANEL
Anion gap: 10 (ref 5–15)
BUN: 19 mg/dL (ref 8–23)
CO2: 24 mmol/L (ref 22–32)
Calcium: 8.1 mg/dL — ABNORMAL LOW (ref 8.9–10.3)
Chloride: 99 mmol/L (ref 98–111)
Creatinine, Ser: 1.03 mg/dL — ABNORMAL HIGH (ref 0.44–1.00)
GFR, Estimated: >60 mL/min (ref >60)
Glucose, Bld: 215 mg/dL — ABNORMAL HIGH (ref 70–99)
Potassium: 3.6 mmol/L (ref 3.5–5.1)
Sodium: 133 mmol/L — ABNORMAL LOW (ref 135–145)

## 2023-04-09 LAB — GLUCOSE, CAPILLARY
Glucose-Capillary: 128 mg/dL — ABNORMAL HIGH (ref 70–99)
Glucose-Capillary: 230 mg/dL — ABNORMAL HIGH (ref 70–99)
Glucose-Capillary: 273 mg/dL — ABNORMAL HIGH (ref 70–99)
Glucose-Capillary: 281 mg/dL — ABNORMAL HIGH (ref 70–99)
Glucose-Capillary: 56 mg/dL — ABNORMAL LOW (ref 70–99)

## 2023-04-09 LAB — MAGNESIUM: Magnesium: 1.7 mg/dL (ref 1.7–2.4)

## 2023-04-09 MED ORDER — BLOOD GLUCOSE TEST VI STRP: 1.0000 | ORAL_STRIP | Freq: Three times a day (TID) | 0 refills | Status: DC

## 2023-04-09 MED ORDER — WARFARIN SODIUM 5 MG PO TABS
2.5000 mg | ORAL_TABLET | ORAL | Status: DC
Start: 2023-04-09 — End: 2023-05-03

## 2023-04-09 MED ORDER — INSULIN PEN NEEDLE 31G X 5 MM MISC: 0 refills | Status: DC

## 2023-04-09 MED ORDER — LANCETS MISC. MISC: 1.0000 | Freq: Three times a day (TID) | 0 refills | Status: AC

## 2023-04-09 MED ORDER — INSULIN GLARGINE-YFGN 100 UNIT/ML ~~LOC~~ SOLN
15.0000 [IU] | Freq: Every day | SUBCUTANEOUS | Status: DC
  Administered 2023-04-09: 15 [IU] via SUBCUTANEOUS
  Filled 2023-04-09: qty 0.15

## 2023-04-09 MED ORDER — INSULIN GLARGINE 100 UNIT/ML SOLOSTAR PEN: 15.0000 [IU] | PEN_INJECTOR | Freq: Every day | SUBCUTANEOUS | 0 refills | Status: DC

## 2023-04-09 MED ORDER — LANCET DEVICE MISC: 1.0000 | Freq: Three times a day (TID) | 0 refills | Status: AC

## 2023-04-09 MED ORDER — BLOOD GLUCOSE MONITORING SUPPL DEVI: 1.0000 | Freq: Three times a day (TID) | 0 refills | Status: DC

## 2023-04-09 NOTE — Progress Notes (Signed)
[name] to be D/C'd Home per MD order.  Discussed with the patient and all questions fully answered.  VSS, Skin clean, dry and intact without evidence of skin break down, no evidence of skin tears noted. IV catheter discontinued intact. Site without signs and symptoms of complications. Dressing and pressure applied.  An After Visit Summary was printed and given to the patient. Patient prescriptions sent to pharmacy.  D/c education completed with patient/family including follow up instructions, medication list, d/c activities limitations if indicated, with other d/c instructions as indicated by MD - patient able to verbalize understanding, all questions fully answered.   Patient instructed to return to ED, call 911, or call MD for any changes in condition.   Patient escorted via WC, and D/C home via private auto.  Pauletta Browns 04/09/2023 12:59 PM

## 2023-04-09 NOTE — Discharge Summary (Addendum)
Physician Discharge Summary  [name] ZOX:[id number] DOB: [birth year] DOA: 04/03/2023  PCP: Chrys Racer, MD  Admit date: 04/03/2023 Discharge date: 04/09/2023  Admitted From: Home Disposition: Home  Recommendations for Outpatient Follow-up:  Follow up with PCP in 1 week with repeat CBC/BMP Outpatient follow-up with GI and oncology Outpatient follow-up with psychiatry. Follow up in ED if symptoms worsen or new appear   Home Health: No Equipment/Devices: None  Discharge Condition: Stable CODE STATUS: Full Diet recommendation: Heart healthy/carb modified  Brief/Interim Summary: 62 y.o. female with medical history significant for rheumatic heart disease s/p MV and TV repair 04/2022, history of pericardial effusion/tamponade s/p pericardiocentesis 05/2022, PAF on Coumadin, CKD stage IIIa, COPD, T2DM, HTN, tobacco use presented with abdominal pain and dysuria.  She was found to have UTI along with possible mass in the pancreatic tail and CT of abdomen.  She was started on IV antibiotics.  GI was consulted.  She underwent EUS on 04/07/2023.  Pathology is pending.  GI has cleared the patient for discharge.  Coumadin was resumed on 04/08/2023.  She has remained stable.  She will be discharged home today with outpatient follow-up with PCP/GI/oncology.  Discharge Diagnoses:   Mass in the pancreatic body and tail with hypodensities in the liver -MRI of abdomen showed findings suggestive of possible pancreatic adenocarcinoma with possible liver and nodal metastases.  CT chest with contrast showed nonspecific small solid pulmonary nodules: Recommend short-term follow-up CT chest in 3 months -GI following: Status post EUS on 04/07/2023.  Pancreatic body/tail mass identified which was biopsied: Pathology pending. -Prior hospitalist communicated with Dr. Bertis Ruddy: Outpatient follow-up arranged for next week with Dr. Mosetta Putt.  Patient needs to follow-up with cancer center next week.  Discharge patient home  today.  GI has cleared the discharge.   E. coli acute cystitis -Antibiotics has been switched from Rocephin to Ancef.  She does not need any more antibiotics on discharge.   Paroxysmal A-fib -Rate controlled.  Continue Cardizem and metoprolol.  Coumadin on hold.  INR pending today.  Patient had to be given vitamin K during the hospitalization to decrease INR below 2. -Resumed Coumadin from 04/08/2023 as GI had given clearance.  Continue Coumadin on discharge.  Outpatient follow-up of INR and Coumadin dose to be adjusted accordingly by PCP   history of rheumatic heart disease status post mitral valve and tricuspid valve repair in May 2023 Chronic diastolic CHF -Prior EF of 60 to 65%.  Currently compensated.  Strict input and output.  Daily weights.  Continue Lasix and metoprolol.  Outpatient follow-up with cardiology/Dr. Anne Fu   Essential hypertension Hyperlipidemia -Continue antihypertensives as above.  Continue statin.  Blood pressure intermittently on the lower side.  Amlodipine and losartan on hold for now.  Patient can follow-up with PCP regarding the same.   Diabetes mellitus type 2 with hyperglycemia and hypoglycemia -A1c 8.7.  Farxiga and linagliptin will be resumed on discharge.  Discharge patient on Lantus 15 units daily.  Carb modified diet.    Chronic kidney disease stage IIIa -Creatinine stable.     Hyponatremia -Mild.  Outpatient follow-up.  Hypokalemia -resolved  Hypomagnesemia -resolved   Thrombocytopenia -Questionable cause.  No signs of bleeding.  Outpatient follow-up.  History of depression and anxiety -Continue current regimen.  Outpatient follow-up with psychiatry   Pulmonary nodules -Plan as above   History of COPD -Stable  Discharge Instructions  Discharge Instructions     Ambulatory referral to Gastroenterology   Complete by: As directed    [facility]  follow-up   What is the reason for referral?: Other   Diet - low sodium heart healthy    Complete by: As directed    Diet Carb Modified   Complete by: As directed    Increase activity slowly   Complete by: As directed       Allergies as of 04/09/2023       Reactions   Norco [hydrocodone-acetaminophen] Itching, Other (See Comments)   Tolerates Oxycodone        Medication List     STOP taking these medications    amLODipine 5 MG tablet Commonly known as: NORVASC   losartan 25 MG tablet Commonly known as: COZAAR       TAKE these medications    albuterol 108 (90 Base) MCG/ACT inhaler Commonly known as: VENTOLIN HFA Inhale 2 puffs into the lungs every 6 (six) hours as needed for wheezing or shortness of breath.   ARIPiprazole 5 MG tablet Commonly known as: ABILIFY Take 2.5 mg by mouth daily.   atorvastatin 20 MG tablet Commonly known as: LIPITOR Take 20 mg by mouth daily.   baclofen 10 MG tablet Commonly known as: LIORESAL Take 10 mg by mouth 2 (two) times daily.   Blood Glucose Monitoring Suppl Devi 1 each by Does not apply route in the morning, at noon, and at bedtime. May substitute to any manufacturer covered by patient's insurance.   BLOOD GLUCOSE TEST STRIPS Strp 1 each by In Vitro route in the morning, at noon, and at bedtime. May substitute to any manufacturer covered by patient's insurance.   buPROPion 300 MG 24 hr tablet Commonly known as: WELLBUTRIN XL Take 300 mg by mouth in the morning.   dexlansoprazole 60 MG capsule Commonly known as: DEXILANT Take 60 mg by mouth daily.   Farxiga 10 MG Tabs tablet Generic drug: dapagliflozin propanediol Take 10 mg by mouth daily.   FLUoxetine 20 MG capsule Commonly known as: PROZAC Take 20 mg by mouth daily.   furosemide 40 MG tablet Commonly known as: LASIX Take 1 tablet (40 mg total) by mouth daily.   gabapentin 300 MG capsule Commonly known as: NEURONTIN Take 300 mg by mouth 3 (three) times daily.   hydrOXYzine 25 MG tablet Commonly known as: ATARAX Take 25 mg by mouth daily.    insulin glargine 100 UNIT/ML Solostar Pen Commonly known as: LANTUS Inject 15 Units into the skin daily.   Insulin Pen Needle 31G X 5 MM Misc Lantus 15 units daily   Lancet Device Misc 1 each by Does not apply route in the morning, at noon, and at bedtime. May substitute to any manufacturer covered by patient's insurance.   Lancets Misc. Misc 1 each by Does not apply route in the morning, at noon, and at bedtime. May substitute to any manufacturer covered by patient's insurance.   linagliptin 5 MG Tabs tablet Commonly known as: TRADJENTA Take 5 mg by mouth daily.   lubiprostone 24 MCG capsule Commonly known as: AMITIZA Take 24 mcg by mouth 2 (two) times daily with a meal.   metoprolol tartrate 25 MG tablet Commonly known as: LOPRESSOR Take 0.5 tablets (12.5 mg total) by mouth 2 (two) times daily.   naloxone 4 MG/0.1ML Liqd nasal spray kit Commonly known as: NARCAN Place 1 spray into the nose as needed (accidental overdose).   omeprazole 10 MG capsule Commonly known as: PRILOSEC Take 10 mg by mouth daily.   oxyCODONE 15 MG immediate release tablet Commonly known as: ROXICODONE Take 15  mg by mouth every 4 (four) hours as needed for pain.   potassium chloride SA 20 MEQ tablet Commonly known as: KLOR-CON M Take one tablet by mouth twice a day for one day, then take one tablet by mouth once daily thereafter. What changed:  how much to take how to take this when to take this   Premarin 0.45 MG tablet Generic drug: estrogens (conjugated) Take 0.45 mg by mouth daily.   QUEtiapine 25 MG tablet Commonly known as: SEROQUEL Take 25 mg by mouth as needed.   Tiadylt ER 300 MG 24 hr capsule Generic drug: diltiazem Take 300 mg by mouth daily.   tiZANidine 4 MG tablet Commonly known as: ZANAFLEX Take 4 mg by mouth 3 (three) times daily as needed for muscle spasms.   traZODone 100 MG tablet Commonly known as: DESYREL Take 200 mg by mouth at bedtime. Pt takes 2 tablets  200 mg at bedtime.   Trintellix 10 MG Tabs tablet Generic drug: vortioxetine HBr Take 10 mg by mouth daily.   Voltaren 1 % Gel Generic drug: diclofenac Sodium Apply 2 g topically 4 (four) times daily as needed (pain).   warfarin 5 MG tablet Commonly known as: COUMADIN Take as directed. If you are unsure how to take this medication, talk to your nurse or doctor. Original instructions: Take 0.5-1 tablets (2.5-5 mg total) by mouth See admin instructions. Take 5mg  by mouth daily except 2.5mg  by mouth on Sundays. What changed: See the new instructions.        Follow-up Information     Chrys Racer, MD. Schedule an appointment as soon as possible for a visit in 1 week(s).   Specialty: Internal Medicine Why: With repeat CBC/BMP/INR Contact information: [address] [place] [address] [phone]                Allergies  Allergen Reactions   Norco [Hydrocodone-Acetaminophen] Itching and Other (See Comments)    Tolerates Oxycodone    Consultations: GI.   Procedures/Studies: CT CHEST W CONTRAST  Result Date: 04/05/2023 CLINICAL DATA:  Lung nodule EXAM: CT CHEST WITH CONTRAST TECHNIQUE: Multidetector CT imaging of the chest was performed during intravenous contrast administration. RADIATION DOSE REDUCTION: This exam was performed according to the departmental dose-optimization program which includes automated exposure control, adjustment of the mA and/or kV according to patient size and/or use of iterative reconstruction technique. CONTRAST:  75mL OMNIPAQUE IOHEXOL 300 MG/ML  SOLN COMPARISON:  Chest CT dated May 25, 2022 FINDINGS: Cardiovascular: Normal heart size. No pericardial effusion. Normal caliber thoracic aorta with mild atherosclerotic disease. Prior tricuspid and mitral valve replacements. No suspicious filling defects of the pulmonary arteries. Mediastinum/Nodes: Esophagus and thyroid are unremarkable. No enlarged lymph nodes seen in the chest.  Lungs/Pleura: Central airways are patent. No consolidation, pleural effusion or pneumothorax. Small solid pulmonary nodules. Reference solid pulmonary nodule of the left lower lobe measuring 9 x 6 mm. Additional reference solid pulmonary nodule of the right lower lobe measuring 4 mm on series 7, image 110. Upper Abdomen: Partially visualized pancreatic tail mass and hypoattenuating liver lesions, better evaluated on recently performed MRI of the abdomen and CT of the abdomen and pelvis. Musculoskeletal: Prior median sternotomy. No aggressive appearing osseous lesions. IMPRESSION: 1. Nonspecific small solid pulmonary nodules, largest of the left lower lobe measures 9 x 6 mm. Recommend short-term follow-up chest CT in 3 months for further evaluation. 2. No evidence nodal metastatic disease in the chest. 3. Partially visualized pancreatic tail mass and hypoattenuating  liver lesions, better evaluated on recently performed MRI of the abdomen and CT of the abdomen and pelvis. 4. Aortic Atherosclerosis (ICD10-I70.0). Electronically Signed   By: Allegra Lai M.D.   On: 04/05/2023 16:21   MR ABDOMEN W WO CONTRAST  Result Date: 04/04/2023 CLINICAL DATA:  Evaluate pancreas lesion. EXAM: MRI ABDOMEN WITHOUT AND WITH CONTRAST TECHNIQUE: Multiplanar multisequence MR imaging of the abdomen was performed both before and after the administration of intravenous contrast. CONTRAST:  8mL GADAVIST GADOBUTROL 1 MMOL/ML IV SOLN COMPARISON:  CT 04/03/2023 FINDINGS: Lower chest: No acute findings. Subpleural nodule within the right lower lobe measures 7 mm, image 11/17. Hepatobiliary: Multiple rim enhancing lesions are identified within the liver. The largest is in segment 4 B measuring 3.4 x 3.1, image 32/17. Lesion within segment 6 measures 1.4 by 1.0 cm, image 54/17. Gallbladder appears within normal limits. No gallstones or gallbladder wall edema. The common bile duct measures 6 mm in maximum diameter which is considered upper  limits of normal. Pancreas: There is a large mass involving the body and tail of pancreas which is hypoenhancing measuring 6.4 by 2.2 cm, image 37/19. There is diffuse dilatation of the main duct and side branches of the main duct within the tail of pancreas compatible with main ductal obstruction. Encasement of the celiac artery and its branches are identified. At least partial encasement of the left side of the splenic artery is suspected, image 40/19. The portal vein, portal venous confluence and SMV do not appear to be involved. The splenic vein appears completely occluded. Spleen:  Within normal limits in size and appearance. Adrenals/Urinary Tract: Normal adrenal glands. No signs of kidney mass or hydronephrosis. Stomach/Bowel: Stomach appears nondistended. No dilated bowel loops identified. Vascular/Lymphatic: Normal appearance of the abdominal aorta. Lymph node within the porta hepatic region measures 1.3 cm, image 30/17. Other: Trace fluid identified around the spleen. No focal fluid collections identified. Musculoskeletal: No suspicious bone lesions identified. IMPRESSION: 1. There is a large hypoenhancing mass involving the body and tail of pancreasa compatible with pancreatic adenocarcinoma. 2. 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 At least partial encasement of the left side of the splenic artery is suspected. The portal vein, portal venous confluence and SMV do not appear to be involved. The splenic vein appears completely occluded. 3. Multiple rim enhancing lesions are identified within the liver compatible with metastatic disease. 4. Lymph node within the porta hepatic region measures 1.3 cm. Suspicious for nodal metastasis. 5. Subpleural nodule within the right lower lobe measures 7 mm. Consider further evaluation with dedicated CT of the chest. Electronically Signed   By: Signa Kell M.D.   On: 04/04/2023 07:46   MR 3D Recon At Scanner  Result Date:  04/04/2023 CLINICAL DATA:  Evaluate pancreas lesion. EXAM: MRI ABDOMEN WITHOUT AND WITH CONTRAST TECHNIQUE: Multiplanar multisequence MR imaging of the abdomen was performed both before and after the administration of intravenous contrast. CONTRAST:  8mL GADAVIST GADOBUTROL 1 MMOL/ML IV SOLN COMPARISON:  CT 04/03/2023 FINDINGS: Lower chest: No acute findings. Subpleural nodule within the right lower lobe measures 7 mm, image 11/17. Hepatobiliary: Multiple rim enhancing lesions are identified within the liver. The largest is in segment 4 B measuring 3.4 x 3.1, image 32/17. Lesion within segment 6 measures 1.4 by 1.0 cm, image 54/17. Gallbladder appears within normal limits. No gallstones or gallbladder wall edema. The common bile duct measures 6 mm in maximum diameter which is considered upper limits of normal. Pancreas:  There is a large mass involving the body and tail of pancreas which is hypoenhancing measuring 6.4 by 2.2 cm, image 37/19. There is diffuse dilatation of the main duct and side branches of the main duct within the tail of pancreas compatible with main ductal obstruction. Encasement of the celiac artery and its branches are identified. At least partial encasement of the left side of the splenic artery is suspected, image 40/19. The portal vein, portal venous confluence and SMV do not appear to be involved. The splenic vein appears completely occluded. Spleen:  Within normal limits in size and appearance. Adrenals/Urinary Tract: Normal adrenal glands. No signs of kidney mass or hydronephrosis. Stomach/Bowel: Stomach appears nondistended. No dilated bowel loops identified. Vascular/Lymphatic: Normal appearance of the abdominal aorta. Lymph node within the porta hepatic region measures 1.3 cm, image 30/17. Other: Trace fluid identified around the spleen. No focal fluid collections identified. Musculoskeletal: No suspicious bone lesions identified. IMPRESSION: 1. There is a large hypoenhancing mass  involving the body and tail of pancreasa compatible with pancreatic adenocarcinoma. 2. 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 At least partial encasement of the left side of the splenic artery is suspected. The portal vein, portal venous confluence and SMV do not appear to be involved. The splenic vein appears completely occluded. 3. Multiple rim enhancing lesions are identified within the liver compatible with metastatic disease. 4. Lymph node within the porta hepatic region measures 1.3 cm. Suspicious for nodal metastasis. 5. Subpleural nodule within the right lower lobe measures 7 mm. Consider further evaluation with dedicated CT of the chest. Electronically Signed   By: Signa Kell M.D.   On: 04/04/2023 07:46   CT ABDOMEN PELVIS W CONTRAST  Addendum Date: 04/03/2023   ADDENDUM REPORT: 04/03/2023 22:33 ADDENDUM: Critical Value/emergent results were called by telephone at the time of interpretation on 04/03/2023 at 10:33 pm to provider Riki Sheer , who verbally acknowledged these results. Electronically Signed   By: Thornell Sartorius M.D.   On: 04/03/2023 22:33   Result Date: 04/03/2023 CLINICAL DATA:  Abdominal pain, acute, nonlocalized. EXAM: CT ABDOMEN AND PELVIS WITH CONTRAST TECHNIQUE: Multidetector CT imaging of the abdomen and pelvis was performed using the standard protocol following bolus administration of intravenous contrast. RADIATION DOSE REDUCTION: This exam was performed according to the departmental dose-optimization program which includes automated exposure control, adjustment of the mA and/or kV according to patient size and/or use of iterative reconstruction technique. CONTRAST:  75mL OMNIPAQUE IOHEXOL 300 MG/ML  SOLN COMPARISON:  12/18/2020. FINDINGS: Lower chest: A 6 mm nodule is present in the right lower lobe, axial image 9. A 1 cm nodule is noted in the right lower lobe, axial image 23. Hepatobiliary: A 1.2 cm hypodensity with surrounding enhancement  is noted in the right lobe of the liver, segment 5. An ill-defined hypodensity is present in the left lobe of the liver measuring 2.3 cm, hepatic segment 4B. An ill-defined hypodensity is present in the left lobe of the liver measuring 9 mm, hepatic segment 2. No biliary ductal dilatation. The gallbladder is without stones. Pancreas: An ill-defined hypodense mass is present in the distal pancreas measuring 9.9 x 2.9 cm. There is mild pancreatic ductal dilatation at the head of the pancreas measuring 4 mm. Mild fat stranding is noted about the tail of the pancreas. Spleen: Normal in size without focal abnormality. Adrenals/Urinary Tract: No adrenal nodule or mass. The kidneys enhance symmetrically. No renal calculus or hydronephrosis. 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 Stomach/Bowel: Stomach is partially distended. Appendix appears normal. No evidence of bowel wall thickening, distention, or inflammatory changes. No bowel obstruction, free air, or pneumatosis. Vascular/Lymphatic: Aortic atherosclerosis. Prominent lymph nodes are present in the gastrohepatic ligament. The portal vein and superior mesenteric vein are patent. Splenic vein is not definitely seen on exam and may be occluded. Reproductive: Status post hysterectomy. No adnexal masses. Other: No abdominopelvic ascites. Musculoskeletal: Degenerative changes are present in the thoracolumbar spine. No acute or suspicious osseous abnormality. IMPRESSION: 1. Hypoenhancing masslike lesion in the tail of the pancreas measuring 9.9 x 2.9 cm. Mild surrounding fat stranding is noted. Differential diagnosis includes pancreatic adenocarcinoma versus pancreatitis. Correlation with amylase, lipase, and CA 19-9 is recommended. 2. The portal vein and superior mesenteric vein are patent. The splenic vein is not visualized on exam and may be occluded. 3. 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 4. Scattered ill-defined hypodensities in the liver new from the previous exam in the possibility of underlying metastatic disease can not be excluded. MRI with contrast is recommended for further evaluation. 5. Aortic atherosclerosis. 6. Stable right lower lobe pulmonary nodules measuring up to 1.0 cm. Consider one of the following in 3 months for both low-risk and high-risk individuals: (a) repeat chest CT, (b) follow-up PET-CT, or (c) tissue sampling. This recommendation follows the consensus statement: Guidelines for Management of Incidental Pulmonary Nodules Detected on CT Images: From the Fleischner Society 2017; Radiology 2017; 284:228-243. 7. Remaining incidental findings as described above. Electronically Signed: By: Thornell Sartorius M.D. On: 04/03/2023 21:57      Subjective: Patient seen and examined at bedside.  Poor historian.  Feels okay to go back to.  No fever or vomiting reported.  Discharge Exam: Vitals:   04/09/23 0413 04/09/23 1010  BP: 113/89 115/85  Pulse: (!) 106 94  Resp: 14   Temp: 98.1 F (36.7 C)   SpO2: 96%     General: Pt is alert, awake, not in acute distress.  On room air.  Slow to respond.  Poor historian. Cardiovascular: rate controlled, S1/S2 + Respiratory: bilateral decreased breath sounds at bases Abdominal: Soft, mildly tender and stented, bowel sounds + Extremities: Trace lower extremity edema present; no cyanosis    The results of significant diagnostics from this hospitalization (including imaging, microbiology, ancillary and laboratory) are listed below for reference.     Microbiology: Recent Results (from the past 240 hour(s))  Urine Culture (for pregnant, neutropenic or urologic patients or patients with an indwelling urinary catheter)     Status: Abnormal   Collection Time: 04/03/23 11:16 PM   Specimen: Urine, Clean Catch  Result Value Ref Range Status   Specimen Description   Final     URINE, CLEAN CATCH Performed at [facility], [address] [place], [place] [address]    Special Requests   Final    NONE Performed at [facility], [address], [place], [place] [address]    Culture >=100,000 COLONIES/mL ESCHERICHIA COLI (A)  Final   Report Status 04/06/2023 FINAL  Final   Organism ID, Bacteria ESCHERICHIA COLI (A)  Final      Susceptibility   Escherichia coli - MIC*    AMPICILLIN >=32 RESISTANT Resistant     CEFAZOLIN <=4 SENSITIVE Sensitive     CEFEPIME <=0.12 SENSITIVE Sensitive     CEFTRIAXONE <=0.25 SENSITIVE Sensitive  CIPROFLOXACIN >=4 RESISTANT Resistant     GENTAMICIN <=1 SENSITIVE Sensitive     IMIPENEM <=0.25 SENSITIVE Sensitive     NITROFURANTOIN <=16 SENSITIVE Sensitive     TRIMETH/SULFA >=320 RESISTANT Resistant     AMPICILLIN/SULBACTAM 16 INTERMEDIATE Intermediate     PIP/TAZO <=4 SENSITIVE Sensitive     * >=100,000 COLONIES/mL ESCHERICHIA COLI     Labs: BNP (last 3 results) Recent Labs    08/10/22 0231 08/12/22 1352 08/12/22 1829  BNP 713.2* 555.6* 497.2*   Basic Metabolic Panel: Recent Labs  Lab 04/04/23 0746 04/06/23 0557 04/07/23 0639 04/08/23 0705 04/09/23 0827  NA 131* 131* 132* 135 133*  K 3.7 3.7 3.4* 2.8* 3.6  CL 101 101 99 99 99  CO2 20* 22 23 24 24   GLUCOSE 297* 320* 369* 73 215*  BUN 24* 23 20 17 19   CREATININE 0.94 1.17* 1.02* 1.12* 1.03*  CALCIUM 8.2* 8.3* 7.8* 8.2* 8.1*  MG  --   --  1.6* 1.5* 1.7   Liver Function Tests: Recent Labs  Lab 04/03/23 1903 04/04/23 0746  AST 15 13*  ALT 19 17  ALKPHOS 68 63  BILITOT 0.5 0.7  PROT 7.5 6.4*  ALBUMIN 4.3 3.9   Recent Labs  Lab 04/03/23 1903  LIPASE 63*   No results for input(s): "AMMONIA" in the last 168 hours. CBC: Recent Labs  Lab 04/03/23 1903 04/04/23 0746 04/06/23 0557 04/07/23 0639 04/08/23 0705 04/09/23 0827  WBC 8.2 6.3 6.1 4.5 5.2 5.5  NEUTROABS 5.6  --   --   --   --   --   HGB 15.4* 14.3  14.2 12.9 13.9 13.0  HCT 45.3 41.9 41.6 38.0 41.2 39.1  MCV 86.3 86.9 86.0 85.6 86.0 87.7  PLT 147* 122* 126* 101* 108* 110*   Cardiac Enzymes: No results for input(s): "CKTOTAL", "CKMB", "CKMBINDEX", "TROPONINI" in the last 168 hours. BNP: Invalid input(s): "POCBNP" CBG: Recent Labs  Lab 04/08/23 2014 04/09/23 0007 04/09/23 0416 04/09/23 0737 04/09/23 0802  GLUCAP 261* 273* 230* 56* 128*   D-Dimer No results for input(s): "DDIMER" in the last 72 hours. Hgb A1c No results for input(s): "HGBA1C" in the last 72 hours. Lipid Profile No results for input(s): "CHOL", "HDL", "LDLCALC", "TRIG", "CHOLHDL", "LDLDIRECT" in the last 72 hours. Thyroid function studies No results for input(s): "TSH", "T4TOTAL", "T3FREE", "THYROIDAB" in the last 72 hours.  Invalid input(s): "FREET3" Anemia work up No results for input(s): "VITAMINB12", "FOLATE", "FERRITIN", "TIBC", "IRON", "RETICCTPCT" in the last 72 hours. Urinalysis    Component Value Date/Time   COLORURINE YELLOW 04/03/2023 1643   APPEARANCEUR HAZY (A) 04/03/2023 1643   LABSPEC 1.020 04/03/2023 1643   PHURINE 5.0 04/03/2023 1643   GLUCOSEU 150 (A) 04/03/2023 1643   GLUCOSEU NEG mg/dL 40/98/1191 4782   HGBUR LARGE (A) 04/03/2023 1643   HGBUR negative 02/27/2007 1327   BILIRUBINUR NEGATIVE 04/03/2023 1643   KETONESUR NEGATIVE 04/03/2023 1643   PROTEINUR 30 (A) 04/03/2023 1643   UROBILINOGEN 1.0 06/10/2010 1729   NITRITE POSITIVE (A) 04/03/2023 1643   LEUKOCYTESUR TRACE (A) 04/03/2023 1643   Sepsis Labs Recent Labs  Lab 04/06/23 0557 04/07/23 0639 04/08/23 0705 04/09/23 0827  WBC 6.1 4.5 5.2 5.5   Microbiology Recent Results (from the past 240 hour(s))  Urine Culture (for pregnant, neutropenic or urologic patients or patients with an indwelling urinary catheter)     Status: Abnormal   Collection Time: 04/03/23 11:16 PM   Specimen: Urine, Clean Catch  Result Value Ref  Range Status   Specimen Description   Final     URINE, CLEAN CATCH Performed at [facility], [address], [place], [place] [address]    Special Requests   Final    NONE Performed at [facility], [address], [place], [place] [address]    Culture >=100,000 COLONIES/mL ESCHERICHIA COLI (A)  Final   Report Status 04/06/2023 FINAL  Final   Organism ID, Bacteria ESCHERICHIA COLI (A)  Final      Susceptibility   Escherichia coli - MIC*    AMPICILLIN >=32 RESISTANT Resistant     CEFAZOLIN <=4 SENSITIVE Sensitive     CEFEPIME <=0.12 SENSITIVE Sensitive     CEFTRIAXONE <=0.25 SENSITIVE Sensitive     CIPROFLOXACIN >=4 RESISTANT Resistant     GENTAMICIN <=1 SENSITIVE Sensitive     IMIPENEM <=0.25 SENSITIVE Sensitive     NITROFURANTOIN <=16 SENSITIVE Sensitive     TRIMETH/SULFA >=320 RESISTANT Resistant     AMPICILLIN/SULBACTAM 16 INTERMEDIATE Intermediate     PIP/TAZO <=4 SENSITIVE Sensitive     * >=100,000 COLONIES/mL ESCHERICHIA COLI     Time coordinating discharge: 35 minutes  SIGNED:   Glade Lloyd, MD  [facility] 04/09/2023, 11:11 AM

## 2023-04-11 ENCOUNTER — Encounter (HOSPITAL_COMMUNITY): Payer: Self-pay | Admitting: Gastroenterology

## 2023-04-11 LAB — IGG 4: IgG, Subclass 4: 27 mg/dL (ref 2–96)

## 2023-04-12 ENCOUNTER — Encounter: Payer: Self-pay | Admitting: Nurse Practitioner

## 2023-04-12 ENCOUNTER — Encounter: Payer: Self-pay | Admitting: Gastroenterology

## 2023-04-12 ENCOUNTER — Other Ambulatory Visit: Payer: Self-pay

## 2023-04-12 ENCOUNTER — Inpatient Hospital Stay: Payer: 59 | Attending: Nurse Practitioner | Admitting: Nurse Practitioner

## 2023-04-12 VITALS — BP 105/83 | HR 107 | Temp 97.8°F | Resp 16 | Wt 164.1 lb

## 2023-04-12 DIAGNOSIS — Z90710 Acquired absence of both cervix and uterus: Secondary | ICD-10-CM | POA: Diagnosis not present

## 2023-04-12 DIAGNOSIS — Z7984 Long term (current) use of oral hypoglycemic drugs: Secondary | ICD-10-CM | POA: Insufficient documentation

## 2023-04-12 DIAGNOSIS — J449 Chronic obstructive pulmonary disease, unspecified: Secondary | ICD-10-CM | POA: Diagnosis not present

## 2023-04-12 DIAGNOSIS — Z794 Long term (current) use of insulin: Secondary | ICD-10-CM | POA: Diagnosis not present

## 2023-04-12 DIAGNOSIS — Z885 Allergy status to narcotic agent status: Secondary | ICD-10-CM | POA: Diagnosis not present

## 2023-04-12 DIAGNOSIS — R634 Abnormal weight loss: Secondary | ICD-10-CM | POA: Diagnosis not present

## 2023-04-12 DIAGNOSIS — F17210 Nicotine dependence, cigarettes, uncomplicated: Secondary | ICD-10-CM | POA: Insufficient documentation

## 2023-04-12 DIAGNOSIS — N189 Chronic kidney disease, unspecified: Secondary | ICD-10-CM | POA: Insufficient documentation

## 2023-04-12 DIAGNOSIS — Z7901 Long term (current) use of anticoagulants: Secondary | ICD-10-CM | POA: Insufficient documentation

## 2023-04-12 DIAGNOSIS — E1140 Type 2 diabetes mellitus with diabetic neuropathy, unspecified: Secondary | ICD-10-CM | POA: Diagnosis not present

## 2023-04-12 DIAGNOSIS — F419 Anxiety disorder, unspecified: Secondary | ICD-10-CM | POA: Diagnosis not present

## 2023-04-12 DIAGNOSIS — Z79899 Other long term (current) drug therapy: Secondary | ICD-10-CM | POA: Diagnosis not present

## 2023-04-12 DIAGNOSIS — Z79891 Long term (current) use of opiate analgesic: Secondary | ICD-10-CM | POA: Diagnosis not present

## 2023-04-12 DIAGNOSIS — Z8049 Family history of malignant neoplasm of other genital organs: Secondary | ICD-10-CM | POA: Insufficient documentation

## 2023-04-12 DIAGNOSIS — Z952 Presence of prosthetic heart valve: Secondary | ICD-10-CM | POA: Insufficient documentation

## 2023-04-12 DIAGNOSIS — M549 Dorsalgia, unspecified: Secondary | ICD-10-CM | POA: Diagnosis not present

## 2023-04-12 DIAGNOSIS — K8689 Other specified diseases of pancreas: Secondary | ICD-10-CM | POA: Diagnosis not present

## 2023-04-12 DIAGNOSIS — G4700 Insomnia, unspecified: Secondary | ICD-10-CM | POA: Insufficient documentation

## 2023-04-12 DIAGNOSIS — I4891 Unspecified atrial fibrillation: Secondary | ICD-10-CM | POA: Diagnosis not present

## 2023-04-12 DIAGNOSIS — I700 Atherosclerosis of aorta: Secondary | ICD-10-CM | POA: Insufficient documentation

## 2023-04-12 DIAGNOSIS — E1122 Type 2 diabetes mellitus with diabetic chronic kidney disease: Secondary | ICD-10-CM | POA: Insufficient documentation

## 2023-04-12 DIAGNOSIS — I5032 Chronic diastolic (congestive) heart failure: Secondary | ICD-10-CM | POA: Diagnosis not present

## 2023-04-12 DIAGNOSIS — F32A Depression, unspecified: Secondary | ICD-10-CM | POA: Insufficient documentation

## 2023-04-12 DIAGNOSIS — R160 Hepatomegaly, not elsewhere classified: Secondary | ICD-10-CM

## 2023-04-12 DIAGNOSIS — C259 Malignant neoplasm of pancreas, unspecified: Secondary | ICD-10-CM | POA: Insufficient documentation

## 2023-04-12 DIAGNOSIS — K5900 Constipation, unspecified: Secondary | ICD-10-CM | POA: Insufficient documentation

## 2023-04-12 LAB — CYTOLOGY - NON PAP

## 2023-04-12 LAB — SURGICAL PATHOLOGY

## 2023-04-12 MED ORDER — MORPHINE SULFATE ER 15 MG PO TBCR: 15.0000 mg | EXTENDED_RELEASE_TABLET | Freq: Two times a day (BID) | ORAL | 0 refills | Status: DC

## 2023-04-12 MED ORDER — OXYCODONE HCL 15 MG PO TABS: 15.0000 mg | ORAL_TABLET | ORAL | 0 refills | Status: DC | PRN

## 2023-04-12 NOTE — Progress Notes (Unsigned)
I met with Ms [name] after her consultation with Santiago Glad, NP and Dr Mosetta Putt.  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 All questions were answered.  She verbalized understanding.

## 2023-04-12 NOTE — Progress Notes (Addendum)
[facility]   [phone]) [phone] [phone]   Clinic New Consult Note   Patient Care Team: Chrys Racer, MD as PCP - General (Internal Medicine) Christell Constant, MD as PCP - Cardiology (Cardiology) Blair Promise, OD as Consulting Physician (Optometry) Christell Constant, MD as Consulting Physician (Cardiology) 04/12/2023  CHIEF COMPLAINTS/PURPOSE OF CONSULTATION:  Pancreatic mass, referred by hospitalist  HISTORY OF PRESENTING ILLNESS:  [name] 62 y.o. female with past medical history including AVM, HTN, A-fib, rheumatic heart disease s/p mitral and tricuspid valve repair 04/2022 and diastolic CHF, COPD, GERD, DM with neuropathy, CKD, anxiety and depression, and history of hep C is here because of pancreatic mass.  She presented to ED 04/03/2023 with abdominal pain and dysuria.  UA consistent with a UTI.  CT abdomen/pelvis showed a hypoenhancing masslike lesion in the tail of the pancreas measuring 9.9 x 2.9 cm, scattered ill-defined hypodensities in the liver, and stable right lower lobe pulmonary nodule.  She was admitted.  MR abdomen 04/04/2023 showed a large hypoenhancing mass involving the body and tail of the pancreas compatible with pancreatic adenocarcinoma which encased the celiac artery and its branches and at least partial encasement of the left side of the splenic artery.  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  There are also multiple rim-enhancing lesions in the liver compatible with metastatic disease and a lymph node in the porta hepatic region measuring 1.3 cm.  Dedicated CT chest 4/30 showed nonspecific small solid pulmonary nodules largest in the left lower lobe measuring 9 x 6 mm with a recommendation for short-term follow-up.  CA 19.9 elevated to 77.  She underwent EUS by Dr. Meridee Score 04/07/2023 which showed an irregular  hypoechoic mass in the pancreatic body/tail measuring 35 mm x 23 mm with extension more proximately towards the tail as reflected on imaging.  There was evidence suggesting invasion into the SMA (encasement), the celiac trunk (abutment) and the splenoportal confluence (abutment).  There were no suspicious lymph nodes.  This was staged T4 N0 by EUS criteria if malignancy is confirmed.  Pathology showed atypical cells.   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  She is independent with ADLs and drives herself.  She has had cancer screenings in the past.  1 living child with schizophrenia.  She is the middle of 3 siblings. Family history includes a mother who died of GYN cancer in her 38s, they think cervical cancer.  Today she presents with her sister.  She reports that her health is not good in general.  She weighed 300 pounds a year ago, lost some weight intentionally but 100 pounds unintentionally since then.  She has decreased appetite, and is mostly sedentary for the past 3-6 months.  She has constant epigastric and abdominal pain that goes straight to her back which she rates 8/10.  Takes oxycodone 15 mg every 4 hours which is partially effective.  Bowels move once a week or so at baseline.  Denies nausea/vomiting, fever, chills, signs of jaundice.  Denies cough, chest pain, dyspnea.    MEDICAL HISTORY:  Past Medical History:  Diagnosis Date   Acute pericardial effusion 05/17/2022   AKI (acute kidney injury) (HCC) 05/17/2022   Anxiety    Arthritis    Cervicalgia    CHF (congestive heart failure) (HCC)    Chondromalacia of patella  Chronic neck pain    Chronic pain syndrome    Depression    secondary to loss of her son at age 15   Diabetic neuropathy (HCC)    DMII (diabetes mellitus, type 2) (HCC)    Dysrhythmia    Enthesopathy of hip region    Fibromyalgia    GERD (gastroesophageal reflux disease)    Hep C w/o coma, chronic  (HCC)    Hepatitis C    diagnosed 2005   Hypertension    Insomnia    Low back pain    Lumbago    Mitral regurgitation    Obesity    Pain in joint, upper arm    Primary localized osteoarthrosis, lower leg    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   Substance abuse (HCC)    sober since 2001   Tobacco abuse    Tricuspid regurgitation    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    SURGICAL HISTORY: Past Surgical History:  Procedure Laterality Date   ABDOMINAL HYSTERECTOMY     at age 41, unknown reasons   BIOPSY  04/07/2023   Procedure: BIOPSY;  Surgeon: Lemar Lofty., MD;  Location: [facility];  Service: Gastroenterology;;   Fidela Salisbury RELEASE  12/07/2011   left side   COLONOSCOPY WITH PROPOFOL N/A 07/30/2022   Procedure: COLONOSCOPY WITH PROPOFOL;  Surgeon: Meryl Dare, MD;  Location: [facility] ENDOSCOPY;  Service: Gastroenterology;  Laterality: N/A;   ESOPHAGOGASTRODUODENOSCOPY (EGD) WITH PROPOFOL N/A 04/07/2023   Procedure: ESOPHAGOGASTRODUODENOSCOPY (EGD) WITH PROPOFOL;  Surgeon: Meridee Score Netty Starring., MD;  Location: [facility] ENDOSCOPY;  Service: Gastroenterology;  Laterality: N/A;   EUS N/A 04/07/2023   Procedure: ESOPHAGEAL ENDOSCOPIC ULTRASOUND (EUS) RADIAL;  Surgeon: Meridee Score Netty Starring., MD;  Location: [facility] ENDOSCOPY;  Service: Gastroenterology;  Laterality: N/A;   EYE SURGERY     FINE NEEDLE ASPIRATION N/A 04/07/2023   Procedure: FINE NEEDLE ASPIRATION (FNA) LINEAR;  Surgeon: Lemar Lofty., MD;  Location: [facility] ENDOSCOPY;  Service: Gastroenterology;  Laterality: N/A;   FOOT SURGERY Bilateral    HOT HEMOSTASIS N/A 07/30/2022   Procedure: HOT HEMOSTASIS (ARGON PLASMA COAGULATION/BICAP);  Surgeon: Meryl Dare, MD;  Location: [facility] ENDOSCOPY;  Service: Gastroenterology;  Laterality: N/A;   MITRAL VALVE REPAIR N/A 04/19/2022   Procedure: MITRAL VALVE REPAIR WITH PHYSIO II ANNULOPLASTY RING;  Surgeon: Loreli Slot, MD;  Location: [facility] OR;   Service: Open Heart Surgery;  Laterality: N/A;   PERICARDIOCENTESIS N/A 05/10/2022   Procedure: PERICARDIOCENTESIS;  Surgeon: Corky Crafts, MD;  Location: [facility] INVASIVE CV LAB;  Service: Cardiovascular;  Laterality: N/A;   POLYPECTOMY  07/30/2022   Procedure: POLYPECTOMY;  Surgeon: Meryl Dare, MD;  Location: [facility] ENDOSCOPY;  Service: Gastroenterology;;   RIGHT/LEFT HEART CATH AND CORONARY ANGIOGRAPHY N/A 02/12/2022   Procedure: RIGHT/LEFT HEART CATH AND CORONARY ANGIOGRAPHY;  Surgeon: Lyn Records, MD;  Location: [facility] INVASIVE CV LAB;  Service: Cardiovascular;  Laterality: N/A;   TEE WITHOUT CARDIOVERSION N/A 02/03/2022   Procedure: TRANSESOPHAGEAL ECHOCARDIOGRAM (TEE);  Surgeon: Wendall Stade, MD;  Location: [facility] ENDOSCOPY;  Service: Cardiovascular;  Laterality: N/A;   TEE WITHOUT CARDIOVERSION N/A 04/19/2022   Procedure: TRANSESOPHAGEAL ECHOCARDIOGRAM (TEE);  Surgeon: Loreli Slot, MD;  Location: [facility] OR;  Service: Open Heart Surgery;  Laterality: N/A;   TEE WITHOUT CARDIOVERSION N/A 07/28/2022   Procedure: TRANSESOPHAGEAL ECHOCARDIOGRAM (TEE);  Surgeon: Thurmon Fair, MD;  Location: [facility] ENDOSCOPY;  Service: Cardiovascular;  Laterality: N/A;   TRICUSPID VALVE REPLACEMENT N/A  04/19/2022   Procedure: TRICUSPID VALVE REPAIR WITH MC3 ANNULOPLASTY RING;  Surgeon: Loreli Slot, MD;  Location: [facility] OR;  Service: Open Heart Surgery;  Laterality: N/A;    SOCIAL HISTORY: Social History   Socioeconomic History   Marital status: Single    Spouse name: Not on file   Number of children: 2   Years of education: Not on file   Highest education level: Not on file  Occupational History   Occupation: CNA    Comment: worked for 15 years   Occupation: disability  Tobacco Use   Smoking status: Every Day    Packs/day: 0.25    Years: 35.00    Additional pack years: 0.00    Total pack years: 8.75    Types: Cigarettes   Smokeless tobacco: Never   Tobacco comments:    4  cigarettes a day  Vaping Use   Vaping Use: Some days  Substance and Sexual Activity   Alcohol use: No   Drug use: No   Sexual activity: Not Currently    Comment: Celebate since 2000  Other Topics Concern   Not on file  Social History Narrative   Born and raised by mom until mom died when she was 14yo. After that she stayed with her aunt. Dad was married to someone else and was never around.  Pt has 1 brother and 1 sister and pt is the middle child. Pt had a very rough childhood. Never married. 2 kids one son died several  yrs ago and other son has Schizophrenia. Pt has graduated HS and has some college. Pt is on disability and is unemployed. She used to work as a Lawyer until 2000.       Financial assistance approved for 100% discount at [facility] and has [facility] card   Rudell Cobb  September 29, 2010 2:27 PM   Social Determinants of Health   Financial Resource Strain: Not on file  Food Insecurity: No Food Insecurity (04/04/2023)   Hunger Vital Sign    Worried About Running Out of Food in the Last Year: Never true    Ran Out of Food in the Last Year: Never true  Transportation Needs: No Transportation Needs (04/04/2023)   PRAPARE - Administrator, Civil Service (Medical): No    Lack of Transportation (Non-Medical): No  Physical Activity: Not on file  Stress: Not on file  Social Connections: Not on file  Intimate Partner Violence: Not At Risk (04/04/2023)   Humiliation, Afraid, Rape, and Kick questionnaire    Fear of Current or Ex-Partner: No    Emotionally Abused: No    Physically Abused: No    Sexually Abused: No    FAMILY HISTORY: Family History  Problem Relation Age of Onset   Uterine cancer Mother    Alcohol abuse Father    Alcohol abuse Sister    Drug abuse Sister    Drug abuse Brother    Alcohol abuse Brother    Schizophrenia Son    Diabetes Other    Arthritis Other    Hypertension Other    Heart attack Son 58       Died suddenly    ALLERGIES:  is allergic to  norco [hydrocodone-acetaminophen].  MEDICATIONS:  Current Outpatient Medications  Medication Sig Dispense Refill   albuterol (PROVENTIL HFA;VENTOLIN HFA) 108 (90 Base) MCG/ACT inhaler Inhale 2 puffs into the lungs every 6 (six) hours as needed for wheezing or shortness of breath.  ARIPiprazole (ABILIFY) 5 MG tablet Take 2.5 mg by mouth daily.     atorvastatin (LIPITOR) 20 MG tablet Take 20 mg by mouth daily.     baclofen (LIORESAL) 10 MG tablet Take 10 mg by mouth 2 (two) times daily.     Blood Glucose Monitoring Suppl DEVI 1 each by Does not apply route in the morning, at noon, and at bedtime. May substitute to any manufacturer covered by patient's insurance. 1 each 0   buPROPion (WELLBUTRIN XL) 300 MG 24 hr tablet Take 300 mg by mouth in the morning.     dexlansoprazole (DEXILANT) 60 MG capsule Take 60 mg by mouth daily.     diclofenac Sodium (VOLTAREN) 1 % GEL Apply 2 g topically 4 (four) times daily as needed (pain).     FARXIGA 10 MG TABS tablet Take 10 mg by mouth daily.     FLUoxetine (PROZAC) 20 MG capsule Take 20 mg by mouth daily.     furosemide (LASIX) 40 MG tablet Take 1 tablet (40 mg total) by mouth daily. 90 tablet 3   gabapentin (NEURONTIN) 300 MG capsule Take 300 mg by mouth 3 (three) times daily.     Glucose Blood (BLOOD GLUCOSE TEST STRIPS) STRP 1 each by In Vitro route in the morning, at noon, and at bedtime. May substitute to any manufacturer covered by patient's insurance. 100 each 0   hydrOXYzine (ATARAX) 25 MG tablet Take 25 mg by mouth daily.     insulin glargine (LANTUS) 100 UNIT/ML Solostar Pen Inject 15 Units into the skin daily. 15 mL 0   Insulin Pen Needle 31G X 5 MM MISC Lantus 15 units daily 100 each 0   Lancet Device MISC 1 each by Does not apply route in the morning, at noon, and at bedtime. May substitute to any manufacturer covered by patient's insurance. 1 each 0   Lancets Misc. MISC 1 each by Does not apply route in the morning, at noon, and at bedtime.  May substitute to any manufacturer covered by patient's insurance. 100 each 0   linagliptin (TRADJENTA) 5 MG TABS tablet Take 5 mg by mouth daily.     lubiprostone (AMITIZA) 24 MCG capsule Take 24 mcg by mouth 2 (two) times daily with a meal.     metoprolol tartrate (LOPRESSOR) 25 MG tablet Take 0.5 tablets (12.5 mg total) by mouth 2 (two) times daily. 90 tablet 3   morphine (MS CONTIN) 15 MG 12 hr tablet Take 1 tablet (15 mg total) by mouth every 12 (twelve) hours. 30 tablet 0   naloxone (NARCAN) nasal spray 4 mg/0.1 mL Place 1 spray into the nose as needed (accidental overdose).     omeprazole (PRILOSEC) 10 MG capsule Take 10 mg by mouth daily.     potassium chloride SA (KLOR-CON M) 20 MEQ tablet Take one tablet by mouth twice a day for one day, then take one tablet by mouth once daily thereafter. (Patient taking differently: Take 20 mEq by mouth daily. Take one tablet by mouth twice a day for one day, then take one tablet by mouth once daily thereafter.) 30 tablet 3   PREMARIN 0.45 MG tablet Take 0.45 mg by mouth daily.     QUEtiapine (SEROQUEL) 25 MG tablet Take 25 mg by mouth as needed.     TIADYLT ER 300 MG 24 hr capsule Take 300 mg by mouth daily.     tiZANidine (ZANAFLEX) 4 MG tablet Take 4 mg by mouth 3 (three) times daily  as needed for muscle spasms.     traZODone (DESYREL) 100 MG tablet Take 200 mg by mouth at bedtime. Pt takes 2 tablets 200 mg at bedtime.     TRINTELLIX 10 MG TABS tablet Take 10 mg by mouth daily.     warfarin (COUMADIN) 5 MG tablet Take 0.5-1 tablets (2.5-5 mg total) by mouth See admin instructions. Take 5mg  by mouth daily except 2.5mg  by mouth on Sundays.     No current facility-administered medications for this visit.    REVIEW OF SYSTEMS:   Constitutional: Denies fevers, chills or abnormal night sweats (+) fatigue (+) 100 pounds unintentional weight loss in the past year Eyes: Denies blurriness of vision, double vision or watery eyes Ears, nose, mouth, throat,  and face: Denies mucositis or sore throat Respiratory: Denies cough, dyspnea or wheezes (+) COPD Cardiovascular: Denies palpitation, chest discomfort or lower extremity swelling (+) A-fib Gastrointestinal:  Denies nausea, vomiting, diarrhea, heartburn or change in bowel habits (+) epigastric/abdominal pain (+) constipation Skin: Denies abnormal skin rashes Lymphatics: Denies new lymphadenopathy or easy bruising Neurological:Denies numbness, tingling or new weaknesses (+) diabetic neuropathy in her feet Behavioral/Psych: Mood is stable, no new changes (+) anxiety/depression All other systems were reviewed with the patient and are negative.  PHYSICAL EXAMINATION: ECOG PERFORMANCE STATUS: 1-2  Vitals:   04/12/23 1202  BP: 105/83  Pulse: (!) 107  Resp: 16  Temp: 97.8 F (36.6 C)  SpO2: 98%   Filed Weights   04/12/23 1202  Weight: 164 lb 1.6 oz (74.4 kg)    GENERAL:alert, no distress and comfortable SKIN: No rash or significant lesions EYES:  sclera clear NECK: Without mass LYMPH:  no palpable cervical or supraclavicular lymphadenopathy LUNGS: Decreased throughout, with normal breathing effort HEART: A-fib, regular rate, no lower extremity edema ABDOMEN:abdomen soft and normal bowel sounds.  Epigastric tenderness Musculoskeletal:no cyanosis of digits and no clubbing.  Mid back TTP at the level of the pancreas PSYCH: alert & oriented x 3 with fluent speech NEURO: no focal motor deficits  LABORATORY DATA:  I have reviewed the data as listed    Latest Ref Rng & Units 04/09/2023    8:27 AM 04/08/2023    7:05 AM 04/07/2023    6:39 AM  CBC  WBC 4.0 - 10.5 K/uL 5.5  5.2  4.5   Hemoglobin 12.0 - 15.0 g/dL 16.1  09.6  04.5   Hematocrit 36.0 - 46.0 % 39.1  41.2  38.0   Platelets 150 - 400 K/uL 110  108  101       Latest Ref Rng & Units 04/09/2023    8:27 AM 04/08/2023    7:05 AM 04/07/2023    6:39 AM  CMP  Glucose 70 - 99 mg/dL 409  73  811   BUN 8 - 23 mg/dL 19  17  20    Creatinine  0.44 - 1.00 mg/dL 9.14  7.82  9.56   Sodium 135 - 145 mmol/L 133  135  132   Potassium 3.5 - 5.1 mmol/L 3.6  2.8  3.4   Chloride 98 - 111 mmol/L 99  99  99   CO2 22 - 32 mmol/L 24  24  23    Calcium 8.9 - 10.3 mg/dL 8.1  8.2  7.8      RADIOGRAPHIC STUDIES: I have personally reviewed the radiological images as listed and agreed with the findings in the report. CT CHEST W CONTRAST  Result Date: 04/05/2023 CLINICAL DATA:  Lung nodule EXAM: CT  CHEST WITH CONTRAST TECHNIQUE: Multidetector CT imaging of the chest was performed during intravenous contrast administration. RADIATION DOSE REDUCTION: This exam was performed according to the departmental dose-optimization program which includes automated exposure control, adjustment of the mA and/or kV according to patient size and/or use of iterative reconstruction technique. CONTRAST:  75mL OMNIPAQUE IOHEXOL 300 MG/ML  SOLN COMPARISON:  Chest CT dated May 25, 2022 FINDINGS: Cardiovascular: Normal heart size. No pericardial effusion. Normal caliber thoracic aorta with mild atherosclerotic disease. Prior tricuspid and mitral valve replacements. No suspicious filling defects of the pulmonary arteries. Mediastinum/Nodes: Esophagus and thyroid are unremarkable. No enlarged lymph nodes seen in the chest. Lungs/Pleura: Central airways are patent. No consolidation, pleural effusion or pneumothorax. Small solid pulmonary nodules. Reference solid pulmonary nodule of the left lower lobe measuring 9 x 6 mm. Additional reference solid pulmonary nodule of the right lower lobe measuring 4 mm on series 7, image 110. Upper Abdomen: Partially visualized pancreatic tail mass and hypoattenuating liver lesions, better evaluated on recently performed MRI of the abdomen and CT of the abdomen and pelvis. Musculoskeletal: Prior median sternotomy. No aggressive appearing osseous lesions. IMPRESSION: 1. Nonspecific small solid pulmonary nodules, largest of the left lower lobe measures 9 x 6  mm. Recommend short-term follow-up chest CT in 3 months for further evaluation. 2. No evidence nodal metastatic disease in the chest. 3. Partially visualized pancreatic tail mass and hypoattenuating liver lesions, better evaluated on recently performed MRI of the abdomen and CT of the abdomen and pelvis. 4. Aortic Atherosclerosis (ICD10-I70.0). Electronically Signed   By: Allegra Lai M.D.   On: 04/05/2023 16:21   MR ABDOMEN W WO CONTRAST  Result Date: 04/04/2023 CLINICAL DATA:  Evaluate pancreas lesion. EXAM: MRI ABDOMEN WITHOUT AND WITH CONTRAST TECHNIQUE: Multiplanar multisequence MR imaging of the abdomen was performed both before and after the administration of intravenous contrast. CONTRAST:  8mL GADAVIST GADOBUTROL 1 MMOL/ML IV SOLN COMPARISON:  CT 04/03/2023 FINDINGS: Lower chest: No acute findings. Subpleural nodule within the right lower lobe measures 7 mm, image 11/17. Hepatobiliary: Multiple rim enhancing lesions are identified within the liver. The largest is in segment 4 B measuring 3.4 x 3.1, image 32/17. Lesion within segment 6 measures 1.4 by 1.0 cm, image 54/17. Gallbladder appears within normal limits. No gallstones or gallbladder wall edema. The common bile duct measures 6 mm in maximum diameter which is considered upper limits of normal. Pancreas: There is a large mass involving the body and tail of pancreas which is hypoenhancing measuring 6.4 by 2.2 cm, image 37/19. There is diffuse dilatation of the main duct and side branches of the main duct within the tail of pancreas compatible with main ductal obstruction. Encasement of the celiac artery and its branches are identified. At least partial encasement of the left side of the splenic artery is suspected, image 40/19. The portal vein, portal venous confluence and SMV do not appear to be involved. The splenic vein appears completely occluded. Spleen:  Within normal limits in size and appearance. Adrenals/Urinary Tract: Normal adrenal  glands. No signs of kidney mass or hydronephrosis. Stomach/Bowel: Stomach appears nondistended. No dilated bowel loops identified. Vascular/Lymphatic: Normal appearance of the abdominal aorta. Lymph node within the porta hepatic region measures 1.3 cm, image 30/17. Other: Trace fluid identified around the spleen. No focal fluid collections identified. Musculoskeletal: No suspicious bone lesions identified. IMPRESSION: 1. There is a large hypoenhancing mass involving the body and tail of pancreasa compatible with pancreatic adenocarcinoma. 2. Tumor encasement of the celiac  artery and its branches are identified. At least partial encasement of the left side of the splenic artery is suspected. The portal vein, portal venous confluence and SMV do not appear to be involved. The splenic vein appears completely occluded. 3. Multiple rim enhancing lesions are identified within the liver compatible with metastatic disease. 4. Lymph node within the porta hepatic region measures 1.3 cm. Suspicious for nodal metastasis. 5. Subpleural nodule within the right lower lobe measures 7 mm. Consider further evaluation with dedicated CT of the chest. Electronically Signed   By: Signa Kell M.D.   On: 04/04/2023 07:46   MR 3D Recon At Scanner  Result Date: 04/04/2023 CLINICAL DATA:  Evaluate pancreas lesion. EXAM: MRI ABDOMEN WITHOUT AND WITH CONTRAST TECHNIQUE: Multiplanar multisequence MR imaging of the abdomen was performed both before and after the administration of intravenous contrast. CONTRAST:  8mL GADAVIST GADOBUTROL 1 MMOL/ML IV SOLN COMPARISON:  CT 04/03/2023 FINDINGS: Lower chest: No acute findings. Subpleural nodule within the right lower lobe measures 7 mm, image 11/17. Hepatobiliary: Multiple rim enhancing lesions are identified within the liver. The largest is in segment 4 B measuring 3.4 x 3.1, image 32/17. Lesion within segment 6 measures 1.4 by 1.0 cm, image 54/17. Gallbladder appears within normal limits. No  gallstones or gallbladder wall edema. The common bile duct measures 6 mm in maximum diameter which is considered upper limits of normal. Pancreas: There is a large mass involving the body and tail of pancreas which is hypoenhancing measuring 6.4 by 2.2 cm, image 37/19. There is diffuse dilatation of the main duct and side branches of the main duct within the tail of pancreas compatible with main ductal obstruction. Encasement of the celiac artery and its branches are identified. At least partial encasement of the left side of the splenic artery is suspected, image 40/19. The portal vein, portal venous confluence and SMV do not appear to be involved. The splenic vein appears completely occluded. Spleen:  Within normal limits in size and appearance. Adrenals/Urinary Tract: Normal adrenal glands. No signs of kidney mass or hydronephrosis. Stomach/Bowel: Stomach appears nondistended. No dilated bowel loops identified. Vascular/Lymphatic: Normal appearance of the abdominal aorta. Lymph node within the porta hepatic region measures 1.3 cm, image 30/17. Other: Trace fluid identified around the spleen. No focal fluid collections identified. Musculoskeletal: No suspicious bone lesions identified. IMPRESSION: 1. There is a large hypoenhancing mass involving the body and tail of pancreasa compatible with pancreatic adenocarcinoma. 2. 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 At least partial encasement of the left side of the splenic artery is suspected. The portal vein, portal venous confluence and SMV do not appear to be involved. The splenic vein appears completely occluded. 3. Multiple rim enhancing lesions are identified within the liver compatible with metastatic disease. 4. Lymph node within the porta hepatic region measures 1.3 cm. Suspicious for nodal metastasis. 5. Subpleural nodule within the right lower lobe measures 7 mm. Consider further evaluation with dedicated CT of the chest.  Electronically Signed   By: Signa Kell M.D.   On: 04/04/2023 07:46   CT ABDOMEN PELVIS W CONTRAST  Addendum Date: 04/03/2023   ADDENDUM REPORT: 04/03/2023 22:33 ADDENDUM: Critical Value/emergent results were called by telephone at the time of interpretation on 04/03/2023 at 10:33 pm to provider Riki Sheer , who verbally acknowledged these results. Electronically Signed   By: Thornell Sartorius M.D.   On: 04/03/2023 22:33   Result Date: 04/03/2023 CLINICAL DATA:  Abdominal pain, acute,  nonlocalized. EXAM: CT ABDOMEN AND PELVIS WITH CONTRAST TECHNIQUE: Multidetector CT imaging of the abdomen and pelvis was performed using the standard protocol following bolus administration of intravenous contrast. RADIATION DOSE REDUCTION: This exam was performed according to the departmental dose-optimization program which includes automated exposure control, adjustment of the mA and/or kV according to patient size and/or use of iterative reconstruction technique. CONTRAST:  75mL OMNIPAQUE IOHEXOL 300 MG/ML  SOLN COMPARISON:  12/18/2020. FINDINGS: Lower chest: A 6 mm nodule is present in the right lower lobe, axial image 9. A 1 cm nodule is noted in the right lower lobe, axial image 23. Hepatobiliary: A 1.2 cm hypodensity with surrounding enhancement is noted in the right lobe of the liver, segment 5. An ill-defined hypodensity is present in the left lobe of the liver measuring 2.3 cm, hepatic segment 4B. An ill-defined hypodensity is present in the left lobe of the liver measuring 9 mm, hepatic segment 2. No biliary ductal dilatation. The gallbladder is without stones. Pancreas: An ill-defined hypodense mass is present in the distal pancreas measuring 9.9 x 2.9 cm. There is mild pancreatic ductal dilatation at the head of the pancreas measuring 4 mm. Mild fat stranding is noted about the tail of the pancreas. Spleen: Normal in size without focal abnormality. Adrenals/Urinary Tract: No adrenal nodule or mass. The kidneys  enhance symmetrically. No renal calculus or hydronephrosis. 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 Stomach/Bowel: Stomach is partially distended. Appendix appears normal. No evidence of bowel wall thickening, distention, or inflammatory changes. No bowel obstruction, free air, or pneumatosis. Vascular/Lymphatic: Aortic atherosclerosis. Prominent lymph nodes are present in the gastrohepatic ligament. The portal vein and superior mesenteric vein are patent. Splenic vein is not definitely seen on exam and may be occluded. Reproductive: Status post hysterectomy. No adnexal masses. Other: No abdominopelvic ascites. Musculoskeletal: Degenerative changes are present in the thoracolumbar spine. No acute or suspicious osseous abnormality. IMPRESSION: 1. Hypoenhancing masslike lesion in the tail of the pancreas measuring 9.9 x 2.9 cm. Mild surrounding fat stranding is noted. Differential diagnosis includes pancreatic adenocarcinoma versus pancreatitis. Correlation with amylase, lipase, and CA 19-9 is recommended. 2. The portal vein and superior mesenteric vein are patent. The splenic vein is not visualized on exam and may be occluded. 3. 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 4. Scattered ill-defined hypodensities in the liver new from the previous exam in the possibility of underlying metastatic disease can not be excluded. MRI with contrast is recommended for further evaluation. 5. Aortic atherosclerosis. 6. Stable right lower lobe pulmonary nodules measuring up to 1.0 cm. Consider one of the following in 3 months for both low-risk and high-risk individuals: (a) repeat chest CT, (b) follow-up PET-CT, or (c) tissue sampling. This recommendation follows the consensus statement: Guidelines for Management of Incidental Pulmonary Nodules Detected on CT Images: From the Fleischner Society 2017;  Radiology 2017; 284:228-243. 7. Remaining incidental findings as described above. Electronically Signed: By: Thornell Sartorius M.D. On: 04/03/2023 21:57    ASSESSMENT & PLAN: 62 yo female with   Mass in pancreatic body/tail and liver lesions, highly suspicious for pancreatic adenocarcinoma  -We reviewed her medical record in detail with the patient and sister.  She presented with UTI, acute abdominal pain, and dramatic unintentional weight loss; workup showed large pancreatic mass in the body and tail, multiple new liver lesions, and stable lung lesions.  CA 19-9 elevated to 77.  The overall picture is highly concerning for metastatic pancreatic adenocarcinoma -EUS/FNA by Dr. Meridee Score on 5/2 showed encasement and abutment of the major vasculature, T4 N0 by EUS but unfortunately path was nondiagnostic, showed atypical cells.  -We reviewed this with pt and recommend repeat biopsy, either EUS or liver biopsy. Liver biopsy is ideal as it would confirm metastatic disease and we would request molecular studies.  Pt is agreeable -We briefly discussed if this is metastatic/stage IV pancreatic cancer, she would not be a surgical candidate and therefore likely not curable, but still treatable with traditional chemo.  The goal would be palliative, to improve her cancer related symptoms, quality of life, and prolong her life. She is interested in chemo if she is a candidate.  -Due to her co-morbidities, low PS, and limited social support she is likely not a candidate for FOLFIRINOX, but would probably tolerate gemcitabine/abraxane, or gemcitabine alone at first. -We discussed a Port-A-Cath for treatment pending biopsy confirmation, she is open -I recommend genetic testing to rule out BRCA mutation, she agrees -We will review her case in multidisciplinary GI conference on 5/15 and update the patient -Follow-up after biopsy, to review results and finalize a treatment plan -Questions were answered -Patient seen with  Dr. Mosetta Putt and also met Gardiner Coins our navigator today  Epigastric/abdominal and back pain -Acute, moderate to severe, secondary to #1 -on Oxy IR 15 mg q4 hours, partially effective -I recommend long acting pain management, pt is open to try MS contin BID, and use Oxy IR for breakthrough. I reviewed opioid recommendations/policy. She agrees.  -I encouraged her to start bowel regimen for constipation. She has something at home -Will discuss with IR if patient is a candidate for celiac block -I referred her to palliative care team to see [facility] NP at next visit   Weight loss, secondary to #1 -Weighed 297 lbs on 08/26/21, weighed ~210 lbs this time last year, she reports weight loss was intentional at first, but most of the weight loss was unintentional -Weighs 164 lbs today -I recommend boost/ensure to gain weight, referred to dietician   4. DM with neuropathy, CKD, Afib, CHF, COPD, CKD, CHF -On multidrug regimen including inhaler, insulin, gabapentin, lasix/K, coumadin, etc -She has multiple chronic conditions that appear to be controlled, but I still am concerned about her ability to tolerate intensive treatment  -Per PCP  5. Social, anxiety, depression -She is single, 1 living child who has schizophrenia, and siblings -She has limited social support, interested in home health aide. I referred her to SW -Anxiety/depression managed by psych at [facility], on multidrug regimen. Understandably more anxious now  6. Family history  -Mother had gyn malignancy -I recommend testing to r/o BRCA mutation; if positive and this is confirmed pancreatic adeno, she would be a candidate for PARP inhibitor -I reviewed screening guidelines for pt's sister/siblings and children  7. GOC -She understands if this is confirmed metastatic/Stage IV pancreatic cancer, this is not operable, and therefore likely not curable, but still treatable -Treatment goal would be palliative    PLAN: -Reviewed work up thus  far, hospital notes, imaging, lab, and path results -Repeat biopsy, either repeat EUS by Dr. Meridee Score or IR liver biopsy; I sent a message to the team -Discuss case in GI conference 5/15   -Referrals to SW, dietician, and palliative care -F/up after repeat biopsy  -Once Ca confirmed, Pt open to port placement and BRCA testing with next lab -Pt seen with Dr. Mosetta Putt and met our navigator  today -Rx: MS Contin 15 mg BID    Orders Placed This Encounter  Procedures   CBC with Differential (Cancer Center Only)    Standing Status:   Future    Standing Expiration Date:   04/11/2024   CMP (Cancer Center only)    Standing Status:   Future    Standing Expiration Date:   04/11/2024   CA 19.9    Standing Status:   Future    Standing Expiration Date:   04/11/2024   Amb Referral to Palliative Care    Referral Priority:   Routine    Referral Type:   Consultation    Number of Visits Requested:   1   Ambulatory referral to Social Work    Referral Priority:   Routine    Referral Type:   Consultation    Referral Reason:   Specialty Services Required    Number of Visits Requested:   1   Ambulatory Referral to [facility] Nutrition    Referral Priority:   Routine    Referral Type:   Consultation    Referral Reason:   Specialty Services Required    Number of Visits Requested:   1    All questions were answered. The patient knows to call the clinic with any problems, questions or concerns.      Pollyann Samples, NP 04/12/2023   Addendum I have seen the patient, examined her. I agree with the assessment and and plan and have edited the notes.   62 yo female with multiple medical comorbidities, identically abdominal pain.  I personally reviewed his CT scan images, which showed a large mass in the body of pancreas, and multiple liver lesions, concerning for metastatic pancreatic cancer.  She underwent EUS and fine-needle biopsy, unfortunately the cytology was inconclusive.  We recommend liver biopsy by IR, and  will review with IR to see if this is feasible.  Option of repeating EUS biopsy is also discussed with patient. Her tumor marker CA 19.9 was slightly elevated, concerning for pancreatic adenocarcinoma.  We briefly discussed systemic treatment options for metastatic pancreatic cancer, and molecular and genetic testing to see if she is a candidate for immunotherapy or targeted therapy.  All questions were answered.  Will see her back after liver biopsy.  Terrace Arabia FengMD 04/12/2023

## 2023-04-13 ENCOUNTER — Encounter: Payer: Self-pay | Admitting: *Deleted

## 2023-04-13 ENCOUNTER — Ambulatory Visit (INDEPENDENT_AMBULATORY_CARE_PROVIDER_SITE_OTHER): Payer: 59 | Admitting: Podiatry

## 2023-04-13 ENCOUNTER — Encounter: Payer: Self-pay | Admitting: Podiatry

## 2023-04-13 ENCOUNTER — Inpatient Hospital Stay: Payer: 59 | Admitting: Licensed Clinical Social Worker

## 2023-04-13 DIAGNOSIS — E0843 Diabetes mellitus due to underlying condition with diabetic autonomic (poly)neuropathy: Secondary | ICD-10-CM

## 2023-04-13 DIAGNOSIS — M79675 Pain in left toe(s): Secondary | ICD-10-CM | POA: Diagnosis not present

## 2023-04-13 DIAGNOSIS — M79674 Pain in right toe(s): Secondary | ICD-10-CM

## 2023-04-13 DIAGNOSIS — B351 Tinea unguium: Secondary | ICD-10-CM | POA: Diagnosis not present

## 2023-04-13 NOTE — Progress Notes (Signed)
I spoke with [name] and reviewed the recommendations she get a liver biopsy and her port a cath placed by IR.  She is agreeable to this plan.  I let her know once the biopsy is scheduled I will schedule a f/u appt with Dr Mosetta Putt to review the results and treatment. All questions were answered. She verbalized understanding.

## 2023-04-13 NOTE — Progress Notes (Signed)
[facility] Clinical Social Work  Initial Assessment   [name] is a 62 y.o. year old female contacted by phone. Clinical Social Work was referred by medical provider for assessment of psychosocial needs.   SDOH (Social Determinants of Health) assessments performed: Yes SDOH Interventions    Flowsheet Row Clinical Support from 04/13/2023 in [facility]  SDOH Interventions   Transportation Interventions Payor Benefit  Utilities Interventions Intervention Not Indicated       SDOH Screenings   Food Insecurity: No Food Insecurity (04/04/2023)  Housing: Low Risk  (04/04/2023)  Transportation Needs: No Transportation Needs (04/04/2023)  Utilities: Not At Risk (04/13/2023)  Recent Concern: Utilities - At Risk (04/04/2023)  Tobacco Use: High Risk (04/12/2023)     Distress Screen completed: No     No data to display            Family/Social Information:  Housing Arrangement: patient lives alone Family members/support persons in your life? Family (siblings) and Friends Transportation concerns: yes. Usually drives herself but with more pain, might need assistance  Employment: Legally disabled.  Income source: Secretary/administrator concerns: No Type of concern: None Food access concerns: might need help with Ensure- scheduled to see dietitian Religious or spiritual practice: Not known Services Currently in place:  Disability, UHC Medicare, Medicaid  Coping/ Adjustment to diagnosis: Patient understands treatment plan and what happens next? Understands plan from appt yesterday Concerns about diagnosis and/or treatment: How will I care for myself & transportation to appts Patient reported stressors: Transportation and Help at home Current coping skills/ strengths: Capable of independent living , Communication skills , and Supportive family/friends     SUMMARY: Current SDOH Barriers:  Transportation and Limited support at  home  Clinical Social Work Clinical Goal(s):  Patient will work with SW to address concerns related to transportation and in home support  Interventions: Discussed common feeling and emotions when being diagnosed with cancer, and the importance of support during treatment Informed patient of the support team roles and support services at [facility] Provided CSW contact information and encouraged patient to call with any questions or concerns Referred patient to Personal Care Services (given to RN/MD for completion) Provided pt with information on transportation benefit through [facility] Medicare (48 one-way trips/year) and Medicaid   Follow Up Plan: Patient will contact CSW with any support or resource needs Patient verbalizes understanding of plan: Yes    [name], LCSW Clinical Social Worker [facility]

## 2023-04-13 NOTE — Progress Notes (Signed)
This patient presents to the office with chief complaint of long thick painful nails.  Patient says the nails are painful walking and wearing shoes.  This patient is unable to self treat.  This patient is unable to trim her nails since she is unable to reach her nails.  She has had previous foot surgery.She presents to the office for preventative foot care services.  General Appearance  Alert, conversant and in no acute stress.  Vascular  Dorsalis pedis and posterior tibial  pulses are palpable  bilaterally.  Capillary return is within normal limits  bilaterally. Temperature is within normal limits  bilaterally.  Neurologic  Senn-Weinstein monofilament wire test within normal limits  bilaterally. Muscle power within normal limits bilaterally.  Nails Thick disfigured discolored nails with subungual debris  from hallux to fifth toes bilaterally. No evidence of bacterial infection or drainage bilaterally.  Orthopedic  No limitations of motion  feet .  No crepitus or effusions noted.  No bony pathology or digital deformities noted.  Skin  normotropic skin with no porokeratosis noted bilaterally.  No signs of infections or ulcers noted.     Onychomycosis  Nails  B/L.  Pain in right toes  Pain in left toes  Debridement of nails both feet with nail nipper.  Patient had gel nail polish on her toenails so no dremel use was performed.  RTC 3 months.   Helane Gunther DPM

## 2023-04-13 NOTE — Progress Notes (Signed)
Simonne Come, MD  Leodis Rains D OK for US guided liver lesion Bx CT AP - 4/28 - image 20, series 2; MRI - 4/29 - image 31, series 9)  OK for port placement same day.  Both procedures to be performed in IR.  Nedra Hai

## 2023-04-13 NOTE — Progress Notes (Signed)
PCS referral completed for CMA evaluation for home health. Fax confirmation received.

## 2023-04-14 ENCOUNTER — Telehealth: Payer: Self-pay | Admitting: Nurse Practitioner

## 2023-04-15 ENCOUNTER — Other Ambulatory Visit: Payer: Self-pay

## 2023-04-15 MED ORDER — FLUCONAZOLE 100 MG PO TABS: ORAL_TABLET | ORAL | 0 refills | Status: DC

## 2023-04-15 NOTE — Progress Notes (Signed)
[name] has not returned radiology's call to schedule her liver biopsy and port placement.  I spoke with [name] and asked her to call to schedule these prcedures.  I provided the number [phone]

## 2023-04-20 ENCOUNTER — Telehealth: Payer: Self-pay | Admitting: *Deleted

## 2023-04-20 ENCOUNTER — Other Ambulatory Visit: Payer: Self-pay

## 2023-04-20 NOTE — Telephone Encounter (Signed)
Patient with diagnosis of afib on warfarin for anticoagulation.    Procedure: US liver BX & port placement  Date of procedure: 05/03/23   CHA2DS2-VASc Score = 4   This indicates a 4.8% annual risk of stroke. The patient's score is based upon: CHF History: 1 HTN History: 1 Diabetes History: 1 Stroke History: 0 Vascular Disease History: 0 Age Score: 0 Gender Score: 1      Per office protocol, patient can hold warfarin for 5 days prior to procedure.   Patient will NOT need bridging with Lovenox (enoxaparin) around procedure.  **This guidance is not considered finalized until pre-operative APP has relayed final recommendations.**

## 2023-04-20 NOTE — Progress Notes (Signed)
I spoke with [name] and her sister, Mcneil Sober regarding [name] liver bx and port placement appts.  I reviewed date time location and instructions.  I told them radiology has reached out to the coumadin clinic for instructions in prep for her biopsy.  I told them either Tonya or I will review those instructions with them.  I also reviewed upcoming appts as it relates to [place]. I mailed an appt schedule to [facility].  All questions were answered.  They verbalized understanding.

## 2023-04-20 NOTE — Progress Notes (Signed)
The proposed treatment discussed in conference is for discussion purpose only and is not a binding recommendation.  The patients have not been physically examined, or presented with their treatment options.  Therefore, final treatment plans cannot be decided.  

## 2023-04-20 NOTE — Telephone Encounter (Signed)
   Pre-operative Risk Assessment    Patient Name: [name]  DOB: 1961/02/27 MRN: [id number]      Request for Surgical Clearance    Procedure:   US liver BX & port placement.  Date of Surgery:  Clearance 05/03/23                                 Surgeon:  Not Indicated. Surgeon's Group or Practice Name: [facility] Phone number:  Not Indicated Fax number:  [phone]   Type of Clearance Requested:   - Medical  - Pharmacy:  Hold Warfarin (Coumadin) Not Indicated.   Type of Anesthesia:  Not Indicated   Additional requests/questions:    Signed, Emmit Pomfret   04/20/2023, 1:39 PM

## 2023-04-21 ENCOUNTER — Telehealth: Payer: Self-pay

## 2023-04-21 NOTE — Telephone Encounter (Signed)
  Patient Consent for Virtual Visit         [name] has provided verbal consent on 04/21/2023 for a virtual visit (video or telephone).   CONSENT FOR VIRTUAL VISIT FOR:  [name]  By participating in this virtual visit I agree to the following:  I hereby voluntarily request, consent and authorize [facility] and its employed or contracted physicians, physician assistants, nurse practitioners or other licensed health care professionals (the Practitioner), to provide me with telemedicine health care services (the "Services") as deemed necessary by the treating Practitioner. I acknowledge and consent to receive the Services by the Practitioner via telemedicine. I understand that the telemedicine visit will involve communicating with the Practitioner through live audiovisual communication technology and the disclosure of certain medical information by electronic transmission. I acknowledge that I have been given the opportunity to request an in-person assessment or other available alternative prior to the telemedicine visit and am voluntarily participating in the telemedicine visit.  I understand that I have the right to withhold or withdraw my consent to the use of telemedicine in the course of my care at any time, without affecting my right to future care or treatment, and that the Practitioner or I may terminate the telemedicine visit at any time. I understand that I have the right to inspect all information obtained and/or recorded in the course of the telemedicine visit and may receive copies of available information for a reasonable fee.  I understand that some of the potential risks of receiving the Services via telemedicine include:  Delay or interruption in medical evaluation due to technological equipment failure or disruption; Information transmitted may not be sufficient (e.g. poor resolution of images) to allow for appropriate medical decision making by the  Practitioner; and/or  In rare instances, security protocols could fail, causing a breach of personal health information.  Furthermore, I acknowledge that it is my responsibility to provide information about my medical history, conditions and care that is complete and accurate to the best of my ability. I acknowledge that Practitioner's advice, recommendations, and/or decision may be based on factors not within their control, such as incomplete or inaccurate data provided by me or distortions of diagnostic images or specimens that may result from electronic transmissions. I understand that the practice of medicine is not an exact science and that Practitioner makes no warranties or guarantees regarding treatment outcomes. I acknowledge that a copy of this consent can be made available to me via my patient portal [facility] MyChart), or I can request a printed copy by calling the office of [facility].    I understand that my insurance will be billed for this visit.   I have read or had this consent read to me. I understand the contents of this consent, which adequately explains the benefits and risks of the Services being provided via telemedicine.  I have been provided ample opportunity to ask questions regarding this consent and the Services and have had my questions answered to my satisfaction. I give my informed consent for the services to be provided through the use of telemedicine in my medical care

## 2023-04-21 NOTE — Telephone Encounter (Signed)
Primary Cardiologist:Mahesh A Izora Ribas, MD   Preoperative team, please contact this patient and set up a phone call appointment for further preoperative risk assessment. Please obtain consent and complete medication review. Thank you for your help.   She was seen in the office less than 2 months ago, however due to her complex history I recommend phone call assessment for follow-up.  Levi Aland, NP-C  04/21/2023, 9:50 AM [address], [address] Office [phone] Fax [phone]

## 2023-04-21 NOTE — Telephone Encounter (Signed)
Patient agreeable with telehealth appt.   Med list reviewed and consent given.    Will send a message to the patients primary cardiologist due to patient being confused about which does orf metoprolol and lasix she should be taking.

## 2023-04-21 NOTE — Telephone Encounter (Signed)
Spoke with the patient to review meds for telehealth appt next week and patient was confused on which dose of Metoprolol and Lasix she should be taking.   She states she has 3 bottles of Lasix one bottle that says take 20mg  every other day; the second bottle says 20mg  once a day day and a bottle that says 40mg  once a day. She states she is taking all of them.  The patient also states she is taking metoprolol 50mg  bid and 25mg  tab that is cut in half bid.   Patient requested the provider review her meds and let her know which dosage of each med she should discontinue.

## 2023-04-24 ENCOUNTER — Emergency Department (HOSPITAL_COMMUNITY): Payer: 59

## 2023-04-24 ENCOUNTER — Other Ambulatory Visit: Payer: Self-pay

## 2023-04-24 ENCOUNTER — Emergency Department (HOSPITAL_COMMUNITY)
Admission: EM | Admit: 2023-04-24 | Discharge: 2023-04-24 | Disposition: A | Discharge status: Home / Self Care | Payer: 59 | Attending: Emergency Medicine | Admitting: Emergency Medicine

## 2023-04-24 DIAGNOSIS — R1084 Generalized abdominal pain: Secondary | ICD-10-CM | POA: Diagnosis not present

## 2023-04-24 DIAGNOSIS — R42 Dizziness and giddiness: Secondary | ICD-10-CM | POA: Diagnosis not present

## 2023-04-24 DIAGNOSIS — R110 Nausea: Secondary | ICD-10-CM | POA: Insufficient documentation

## 2023-04-24 DIAGNOSIS — R109 Unspecified abdominal pain: Secondary | ICD-10-CM | POA: Diagnosis present

## 2023-04-24 LAB — COMPREHENSIVE METABOLIC PANEL
ALT: 19 U/L (ref 0–44)
AST: 19 U/L (ref 15–41)
Albumin: 3.7 g/dL (ref 3.5–5.0)
Alkaline Phosphatase: 76 U/L (ref 38–126)
Anion gap: 11 (ref 5–15)
BUN: 16 mg/dL (ref 8–23)
CO2: 26 mmol/L (ref 22–32)
Calcium: 8.7 mg/dL — ABNORMAL LOW (ref 8.9–10.3)
Chloride: 98 mmol/L (ref 98–111)
Creatinine, Ser: 1.29 mg/dL — ABNORMAL HIGH (ref 0.44–1.00)
GFR, Estimated: 47 mL/min — ABNORMAL LOW (ref >60)
Glucose, Bld: 304 mg/dL — ABNORMAL HIGH (ref 70–99)
Potassium: 4.3 mmol/L (ref 3.5–5.1)
Sodium: 135 mmol/L (ref 135–145)
Total Bilirubin: 0.5 mg/dL (ref 0.3–1.2)
Total Protein: 6.5 g/dL (ref 6.5–8.1)

## 2023-04-24 LAB — URINALYSIS, ROUTINE W REFLEX MICROSCOPIC
Bilirubin Urine: NEGATIVE
Glucose, UA: >=500 mg/dL — AB
Hgb urine dipstick: NEGATIVE
Ketones, ur: NEGATIVE mg/dL
Leukocytes,Ua: NEGATIVE
Nitrite: NEGATIVE
Protein, ur: NEGATIVE mg/dL
Specific Gravity, Urine: 1.013 (ref 1.005–1.030)
pH: 6 (ref 5.0–8.0)

## 2023-04-24 LAB — CBC WITH DIFFERENTIAL/PLATELET
Abs Immature Granulocytes: 0.02 K/uL (ref 0.00–0.07)
Basophils Absolute: 0 K/uL (ref 0.0–0.1)
Basophils Relative: 0 %
Eosinophils Absolute: 0.1 K/uL (ref 0.0–0.5)
Eosinophils Relative: 2 %
HCT: 38.3 % (ref 36.0–46.0)
Hemoglobin: 12.2 g/dL (ref 12.0–15.0)
Immature Granulocytes: 0 %
Lymphocytes Relative: 27 %
Lymphs Abs: 1.4 K/uL (ref 0.7–4.0)
MCH: 29.5 pg (ref 26.0–34.0)
MCHC: 31.9 g/dL (ref 30.0–36.0)
MCV: 92.7 fL (ref 80.0–100.0)
Monocytes Absolute: 0.4 K/uL (ref 0.1–1.0)
Monocytes Relative: 7 %
Neutro Abs: 3.3 K/uL (ref 1.7–7.7)
Neutrophils Relative %: 64 %
Platelets: 118 K/uL — ABNORMAL LOW (ref 150–400)
RBC: 4.13 MIL/uL (ref 3.87–5.11)
RDW: 15.9 % — ABNORMAL HIGH (ref 11.5–15.5)
WBC: 5.3 K/uL (ref 4.0–10.5)
nRBC: 0 % (ref 0.0–0.2)

## 2023-04-24 LAB — LIPASE, BLOOD: Lipase: 42 U/L (ref 11–51)

## 2023-04-24 MED ORDER — NALOXONE HCL 4 MG/0.1ML NA LIQD: NASAL | 0 refills | Status: DC

## 2023-04-24 MED ORDER — LACTATED RINGERS IV BOLUS
1500.0000 mL | Freq: Once | INTRAVENOUS | Status: AC
  Administered 2023-04-24: 1500 mL via INTRAVENOUS

## 2023-04-24 MED ORDER — MORPHINE SULFATE (PF) 4 MG/ML IV SOLN
8.0000 mg | Freq: Once | INTRAVENOUS | Status: AC
  Administered 2023-04-24: 8 mg via INTRAVENOUS
  Filled 2023-04-24: qty 2

## 2023-04-24 MED ORDER — ONDANSETRON HCL 4 MG/2ML IJ SOLN
4.0000 mg | Freq: Once | INTRAMUSCULAR | Status: AC
  Administered 2023-04-24: 4 mg via INTRAVENOUS
  Filled 2023-04-24: qty 2

## 2023-04-24 MED ORDER — IOHEXOL 300 MG/ML  SOLN
100.0000 mL | Freq: Once | INTRAMUSCULAR | Status: AC | PRN
  Administered 2023-04-24: 100 mL via INTRAVENOUS

## 2023-04-24 MED ORDER — MORPHINE SULFATE (PF) 4 MG/ML IV SOLN
6.0000 mg | Freq: Once | INTRAVENOUS | Status: AC
  Administered 2023-04-24: 6 mg via INTRAVENOUS
  Filled 2023-04-24: qty 2

## 2023-04-24 MED ORDER — MORPHINE SULFATE 30 MG PO TABS: 30.0000 mg | ORAL_TABLET | ORAL | 0 refills | Status: DC | PRN

## 2023-04-24 NOTE — ED Triage Notes (Signed)
Pt arrived via [facility] from home for abdominal pain. She has been having abdominal pain for 4 weeks and was recently diagnosed with prostate cancer but has not yet started treatments.  BP 128/68 HR 86 O2 98  CBG 136

## 2023-04-24 NOTE — ED Provider Notes (Signed)
[employer] EMERGENCY DEPARTMENT AT [facility] Provider Note   CSN: [id number] Arrival date & time: 04/24/23  1740     History Chief Complaint  Patient presents with   Abdominal Pain    HPI [name] is a 62 y.o. female presenting for chief complaint of abdominal pain.  62 year old female extensive medical history of psychiatric disease presenting with diffuse abdominal pain.  Unfortunately diagnosed with pancreatic cancer last month.  She has had abdominal pain persistently since then but acutely worsened today.  She is having presyncope early satiety difficulty keeping p.o fluids down because of anorexia. Has been started on therapy due to recency of diagnosis..   Patient's recorded medical, surgical, social, medication list and allergies were reviewed in the Snapshot window as part of the initial history.   Review of Systems   Review of Systems  Constitutional:  Positive for activity change, appetite change, fatigue and unexpected weight change. Negative for chills and fever.  HENT:  Negative for ear pain and sore throat.   Eyes:  Negative for pain and visual disturbance.  Respiratory:  Negative for cough and shortness of breath.   Cardiovascular:  Negative for chest pain and palpitations.  Gastrointestinal:  Positive for abdominal pain and nausea. Negative for vomiting.  Genitourinary:  Negative for dysuria and hematuria.  Musculoskeletal:  Negative for arthralgias and back pain.  Skin:  Negative for color change and rash.  Neurological:  Positive for light-headedness. Negative for seizures and syncope.  All other systems reviewed and are negative.   Physical Exam Updated Vital Signs BP (!) 113/99   Pulse (!) 58   Temp 98.4 F (36.9 C) (Oral)   Resp 18   Ht 5\' 11"  (1.803 m)   Wt 81.2 kg   LMP 12/06/1976   SpO2 97%   BMI 24.97 kg/m  Physical Exam Vitals and nursing note reviewed.  Constitutional:      General: She is not in acute distress.     Appearance: She is well-developed.  HENT:     Head: Normocephalic and atraumatic.  Eyes:     Conjunctiva/sclera: Conjunctivae normal.  Cardiovascular:     Rate and Rhythm: Normal rate and regular rhythm.     Heart sounds: No murmur heard. Pulmonary:     Effort: Pulmonary effort is normal. No respiratory distress.     Breath sounds: Normal breath sounds.  Abdominal:     General: There is no distension.     Palpations: Abdomen is soft.     Tenderness: There is no abdominal tenderness. There is no right CVA tenderness or left CVA tenderness.  Musculoskeletal:        General: No swelling or tenderness. Normal range of motion.     Cervical back: Neck supple.  Skin:    General: Skin is warm and dry.  Neurological:     General: No focal deficit present.     Mental Status: She is alert and oriented to person, place, and time. Mental status is at baseline.     Cranial Nerves: No cranial nerve deficit.      ED Course/ Medical Decision Making/ A&P    Procedures Procedures   Medications Ordered in ED Medications  morphine (PF) 4 MG/ML injection 6 mg (6 mg Intravenous Given 04/24/23 1844)  lactated ringers bolus 1,500 mL (1,500 mLs Intravenous New Bag/Given 04/24/23 1836)  ondansetron (ZOFRAN) injection 4 mg (4 mg Intravenous Given 04/24/23 1844)  morphine (PF) 4 MG/ML injection 8 mg (8 mg Intravenous  Given 04/24/23 2000)  iohexol (OMNIPAQUE) 300 MG/ML solution 100 mL (100 mLs Intravenous Contrast Given 04/24/23 2035)   Medical Decision Making:   [name] is a 62 y.o. female who presented to the ED today with abdominal pain, detailed above.    Complete initial physical exam performed, notably the patient  was with severe abdominal pain.     Reviewed and confirmed nursing documentation for past medical history, family history, social history.    Initial Assessment:   With the patient's presentation of abdominal pain, most likely diagnosis is complication and known malignancy.  Other diagnoses were considered including (but not limited to) gastroenteritis, colitis, small bowel obstruction, appendicitis, cholecystitis, pancreatitis, nephrolithiasis, UTI, pyleonephritis. These are considered less likely due to history of present illness and physical exam findings.   This is most consistent with an acute life/limb threatening illness complicated by underlying chronic conditions.   Initial Plan:  CBC/CMP to evaluate for underlying infectious/metabolic etiology for patient's abdominal pain  Lipase to evaluate for pancreatitis  EKG to evaluate for cardiac source of pain  CTAB/Pelvis with contrast to evaluate for structural/surgical etiology of patients' severe abdominal pain.  Urinalysis and repeat physical assessment to evaluate for UTI/Pyelonpehritis  Empiric management of symptoms with escalating pain control and antiemetics as needed.   Initial Study Results:   Laboratory  All laboratory results reviewed without evidence of clinically relevant pathology.      Radiology All images reviewed independently. Agree with radiology report at this time.   CT ABDOMEN PELVIS W CONTRAST  Result Date: 04/24/2023 CLINICAL DATA:  Abdominal pain.  Metastatic pancreatic cancer. EXAM: CT ABDOMEN AND PELVIS WITH CONTRAST TECHNIQUE: Multidetector CT imaging of the abdomen and pelvis was performed using the standard protocol following bolus administration of intravenous contrast. RADIATION DOSE REDUCTION: This exam was performed according to the departmental dose-optimization program which includes automated exposure control, adjustment of the mA and/or kV according to patient size and/or use of iterative reconstruction technique. CONTRAST:  OMNIPAQUE IOHEXOL 300 MG/ML  SOLN COMPARISON:  CT abdomen pelvis dated 04/03/2023. FINDINGS: Lower chest: Similar appearance of right lung base subpleural nodules measure up to 8 mm. Mitral and aortic valve repairs. No intra-abdominal free air or  free fluid. Hepatobiliary: Multiple hypoenhancing hepatic metastatic disease. These lesions appear more conspicuous or have increased since the prior CT. No biliary dilatation. The gallbladder is unremarkable. Pancreas: Hypoenhancing mass involving the body and tail of the pancreas as seen previously in keeping with known malignancy. Spleen: Normal in size without focal abnormality. Adrenals/Urinary Tract: The adrenal glands are unremarkable. There is no hydronephrosis on either side. There is symmetric enhancement and excretion of contrast by both kidneys. The visualized ureters and the bladder appear unremarkable. Stomach/Bowel: Postsurgical changes of bowel with anastomotic suture in the pelvis. There is no bowel obstruction or active inflammation. The appendix is not visualized with certainty. No inflammatory changes identified in the right lower quadrant. Vascular/Lymphatic: Mild aortoiliac atherosclerotic disease. There is tumor encasement of the celiac trunk. There is infiltration of the fat plane around the proximal SMA suggestive of encasement. The celiac artery, SMA, IMA are patent. The renal arteries are patent. There is in case mint of the proximal half of the splenic artery with mass effect and high-grade luminal narrowing. The splenic vein is not visualized and appears thrombosed. The SMV and main portal vein are patent. A 2.7 x 2.0 cm hypoenhancing soft tissue abutting the main portal vein (25/2) may represent an enlarged and metastatic adenopathy versus  infiltrative tumor. No portal venous gas. Reproductive: Hysterectomy.  No adnexal masses. Other: Midline vertical anterior pelvic wall incisional scar. Musculoskeletal: Degenerative changes of the spine. No acute osseous pathology. IMPRESSION: 1. No acute intra-abdominal or pelvic pathology. 2. Hypoenhancing mass involving the body and tail of the pancreas in keeping with known malignancy. 3. Multiple hypoenhancing hepatic metastatic disease. These  lesions appear more conspicuous or have increased since the prior CT. 4. 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 6. Similar appearance of right lung base subpleural nodules measure up to 8 mm. 7.  Aortic Atherosclerosis (ICD10-I70.0). Electronically Signed   By: Elgie Collard M.D.   On: 04/24/2023 21:06   CT CHEST W CONTRAST  Result Date: 04/05/2023 CLINICAL DATA:  Lung nodule EXAM: CT CHEST WITH CONTRAST TECHNIQUE: Multidetector CT imaging of the chest was performed during intravenous contrast administration. RADIATION DOSE REDUCTION: This exam was performed according to the departmental dose-optimization program which includes automated exposure control, adjustment of the mA and/or kV according to patient size and/or use of iterative reconstruction technique. CONTRAST:  75mL OMNIPAQUE IOHEXOL 300 MG/ML  SOLN COMPARISON:  Chest CT dated May 25, 2022 FINDINGS: Cardiovascular: Normal heart size. No pericardial effusion. Normal caliber thoracic aorta with mild atherosclerotic disease. Prior tricuspid and mitral valve replacements. No suspicious filling defects of the pulmonary arteries. Mediastinum/Nodes: Esophagus and thyroid are unremarkable. No enlarged lymph nodes seen in the chest. Lungs/Pleura: Central airways are patent. No consolidation, pleural effusion or pneumothorax. Small solid pulmonary nodules. Reference solid pulmonary nodule of the left lower lobe measuring 9 x 6 mm. Additional reference solid pulmonary nodule of the right lower lobe measuring 4 mm on series 7, image 110. Upper Abdomen: Partially visualized pancreatic tail mass and hypoattenuating liver lesions, better evaluated on recently performed MRI of the abdomen and CT of the abdomen and pelvis. Musculoskeletal: Prior median sternotomy. No aggressive appearing osseous lesions. IMPRESSION: 1. Nonspecific small solid pulmonary nodules, largest of the left lower lobe measures 9 x  6 mm. Recommend short-term follow-up chest CT in 3 months for further evaluation. 2. No evidence nodal metastatic disease in the chest. 3. Partially visualized pancreatic tail mass and hypoattenuating liver lesions, better evaluated on recently performed MRI of the abdomen and CT of the abdomen and pelvis. 4. Aortic Atherosclerosis (ICD10-I70.0). Electronically Signed   By: Allegra Lai M.D.   On: 04/05/2023 16:21   MR ABDOMEN W WO CONTRAST  Result Date: 04/04/2023 CLINICAL DATA:  Evaluate pancreas lesion. EXAM: MRI ABDOMEN WITHOUT AND WITH CONTRAST TECHNIQUE: Multiplanar multisequence MR imaging of the abdomen was performed both before and after the administration of intravenous contrast. CONTRAST:  8mL GADAVIST GADOBUTROL 1 MMOL/ML IV SOLN COMPARISON:  CT 04/03/2023 FINDINGS: Lower chest: No acute findings. Subpleural nodule within the right lower lobe measures 7 mm, image 11/17. Hepatobiliary: Multiple rim enhancing lesions are identified within the liver. The largest is in segment 4 B measuring 3.4 x 3.1, image 32/17. Lesion within segment 6 measures 1.4 by 1.0 cm, image 54/17. Gallbladder appears within normal limits. No gallstones or gallbladder wall edema. The common bile duct measures 6 mm in maximum diameter which is considered upper limits of normal. Pancreas: There is a large mass involving the body and tail of pancreas which is hypoenhancing measuring 6.4 by 2.2 cm, image 37/19. There is diffuse dilatation of the main duct and side branches of the main duct within the tail of pancreas compatible with main ductal  obstruction. Encasement of the celiac artery and its branches are identified. At least partial encasement of the left side of the splenic artery is suspected, image 40/19. The portal vein, portal venous confluence and SMV do not appear to be involved. The splenic vein appears completely occluded. Spleen:  Within normal limits in size and appearance. Adrenals/Urinary Tract: Normal adrenal  glands. No signs of kidney mass or hydronephrosis. Stomach/Bowel: Stomach appears nondistended. No dilated bowel loops identified. Vascular/Lymphatic: Normal appearance of the abdominal aorta. Lymph node within the porta hepatic region measures 1.3 cm, image 30/17. Other: Trace fluid identified around the spleen. No focal fluid collections identified. Musculoskeletal: No suspicious bone lesions identified. IMPRESSION: 1. There is a large hypoenhancing mass involving the body and tail of pancreasa compatible with pancreatic adenocarcinoma. 2. 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 At least partial encasement of the left side of the splenic artery is suspected. The portal vein, portal venous confluence and SMV do not appear to be involved. The splenic vein appears completely occluded. 3. Multiple rim enhancing lesions are identified within the liver compatible with metastatic disease. 4. Lymph node within the porta hepatic region measures 1.3 cm. Suspicious for nodal metastasis. 5. Subpleural nodule within the right lower lobe measures 7 mm. Consider further evaluation with dedicated CT of the chest. Electronically Signed   By: Signa Kell M.D.   On: 04/04/2023 07:46   MR 3D Recon At Scanner  Result Date: 04/04/2023 CLINICAL DATA:  Evaluate pancreas lesion. EXAM: MRI ABDOMEN WITHOUT AND WITH CONTRAST TECHNIQUE: Multiplanar multisequence MR imaging of the abdomen was performed both before and after the administration of intravenous contrast. CONTRAST:  8mL GADAVIST GADOBUTROL 1 MMOL/ML IV SOLN COMPARISON:  CT 04/03/2023 FINDINGS: Lower chest: No acute findings. Subpleural nodule within the right lower lobe measures 7 mm, image 11/17. Hepatobiliary: Multiple rim enhancing lesions are identified within the liver. The largest is in segment 4 B measuring 3.4 x 3.1, image 32/17. Lesion within segment 6 measures 1.4 by 1.0 cm, image 54/17. Gallbladder appears within normal limits. No  gallstones or gallbladder wall edema. The common bile duct measures 6 mm in maximum diameter which is considered upper limits of normal. Pancreas: There is a large mass involving the body and tail of pancreas which is hypoenhancing measuring 6.4 by 2.2 cm, image 37/19. There is diffuse dilatation of the main duct and side branches of the main duct within the tail of pancreas compatible with main ductal obstruction. Encasement of the celiac artery and its branches are identified. At least partial encasement of the left side of the splenic artery is suspected, image 40/19. The portal vein, portal venous confluence and SMV do not appear to be involved. The splenic vein appears completely occluded. Spleen:  Within normal limits in size and appearance. Adrenals/Urinary Tract: Normal adrenal glands. No signs of kidney mass or hydronephrosis. Stomach/Bowel: Stomach appears nondistended. No dilated bowel loops identified. Vascular/Lymphatic: Normal appearance of the abdominal aorta. Lymph node within the porta hepatic region measures 1.3 cm, image 30/17. Other: Trace fluid identified around the spleen. No focal fluid collections identified. Musculoskeletal: No suspicious bone lesions identified. IMPRESSION: 1. There is a large hypoenhancing mass involving the body and tail of pancreasa compatible with pancreatic adenocarcinoma. 2. 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 At least partial encasement of the left side of the splenic artery is suspected. The portal vein, portal venous confluence and SMV do not appear to be involved.  The splenic vein appears completely occluded. 3. Multiple rim enhancing lesions are identified within the liver compatible with metastatic disease. 4. Lymph node within the porta hepatic region measures 1.3 cm. Suspicious for nodal metastasis. 5. Subpleural nodule within the right lower lobe measures 7 mm. Consider further evaluation with dedicated CT of the chest.  Electronically Signed   By: Signa Kell M.D.   On: 04/04/2023 07:46   CT ABDOMEN PELVIS W CONTRAST  Addendum Date: 04/03/2023   ADDENDUM REPORT: 04/03/2023 22:33 ADDENDUM: Critical Value/emergent results were called by telephone at the time of interpretation on 04/03/2023 at 10:33 pm to provider Riki Sheer , who verbally acknowledged these results. Electronically Signed   By: Thornell Sartorius M.D.   On: 04/03/2023 22:33   Result Date: 04/03/2023 CLINICAL DATA:  Abdominal pain, acute, nonlocalized. EXAM: CT ABDOMEN AND PELVIS WITH CONTRAST TECHNIQUE: Multidetector CT imaging of the abdomen and pelvis was performed using the standard protocol following bolus administration of intravenous contrast. RADIATION DOSE REDUCTION: This exam was performed according to the departmental dose-optimization program which includes automated exposure control, adjustment of the mA and/or kV according to patient size and/or use of iterative reconstruction technique. CONTRAST:  75mL OMNIPAQUE IOHEXOL 300 MG/ML  SOLN COMPARISON:  12/18/2020. FINDINGS: Lower chest: A 6 mm nodule is present in the right lower lobe, axial image 9. A 1 cm nodule is noted in the right lower lobe, axial image 23. Hepatobiliary: A 1.2 cm hypodensity with surrounding enhancement is noted in the right lobe of the liver, segment 5. An ill-defined hypodensity is present in the left lobe of the liver measuring 2.3 cm, hepatic segment 4B. An ill-defined hypodensity is present in the left lobe of the liver measuring 9 mm, hepatic segment 2. No biliary ductal dilatation. The gallbladder is without stones. Pancreas: An ill-defined hypodense mass is present in the distal pancreas measuring 9.9 x 2.9 cm. There is mild pancreatic ductal dilatation at the head of the pancreas measuring 4 mm. Mild fat stranding is noted about the tail of the pancreas. Spleen: Normal in size without focal abnormality. Adrenals/Urinary Tract: No adrenal nodule or mass. The kidneys  enhance symmetrically. No renal calculus or hydronephrosis. 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 Stomach/Bowel: Stomach is partially distended. Appendix appears normal. No evidence of bowel wall thickening, distention, or inflammatory changes. No bowel obstruction, free air, or pneumatosis. Vascular/Lymphatic: Aortic atherosclerosis. Prominent lymph nodes are present in the gastrohepatic ligament. The portal vein and superior mesenteric vein are patent. Splenic vein is not definitely seen on exam and may be occluded. Reproductive: Status post hysterectomy. No adnexal masses. Other: No abdominopelvic ascites. Musculoskeletal: Degenerative changes are present in the thoracolumbar spine. No acute or suspicious osseous abnormality. IMPRESSION: 1. Hypoenhancing masslike lesion in the tail of the pancreas measuring 9.9 x 2.9 cm. Mild surrounding fat stranding is noted. Differential diagnosis includes pancreatic adenocarcinoma versus pancreatitis. Correlation with amylase, lipase, and CA 19-9 is recommended. 2. The portal vein and superior mesenteric vein are patent. The splenic vein is not visualized on exam and may be occluded. 3. 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 4. Scattered ill-defined hypodensities in the liver new from the previous exam in the possibility of underlying metastatic disease can not be excluded. MRI with contrast is recommended for further evaluation. 5. Aortic atherosclerosis. 6. Stable right lower lobe pulmonary nodules measuring up to 1.0 cm. Consider one of  the following in 3 months for both low-risk and high-risk individuals: (a) repeat chest CT, (b) follow-up PET-CT, or (c) tissue sampling. This recommendation follows the consensus statement: Guidelines for Management of Incidental Pulmonary Nodules Detected on CT Images: From the Fleischner Society 2017;  Radiology 2017; 284:228-243. 7. Remaining incidental findings as described above. Electronically Signed: By: Thornell Sartorius M.D. On: 04/03/2023 21:57   .   Final Reassessment and Plan:   Reevaluated patient at bedside.  No acute distress ambulatory tolerating p.o. intake and pain is under control after 2 doses of IV narcotics.  Long conversation with patient.  This is likely exacerbation of her pain from her metastatic malignancy.  Patient expressed understanding she is planning to follow-up with her oncologist tomorrow.  States that she has been prescribed oxycodone but does not like how it makes her feel.  She states that the morphine sulfate product substantially helps her pain. Chart review reveals a history of allegedl drug diversion, however patient has a significant end-of-life condition in the form of pancreatic cancer and I believe warrants ongoing care and management.  Since she seems to be getting more benefit from morphine than oxycodone will give a 3-day prescription of p.o. morphine product and recommend patient follow-up very closely with her primary care and primary oncologic physician in the outpatient setting.   Clinical Impression:  1. Generalized abdominal pain      Data Unavailable   Final Clinical Impression(s) / ED Diagnoses Final diagnoses:  Generalized abdominal pain    Rx / DC Orders ED Discharge Orders          Ordered    morphine (MSIR) 30 MG tablet  Every 4 hours PRN        04/24/23 2144    naloxone (NARCAN) nasal spray 4 mg/0.1 mL        04/24/23 2144              Glyn Ade, MD 04/24/23 2145

## 2023-04-25 ENCOUNTER — Other Ambulatory Visit: Payer: Self-pay

## 2023-04-25 ENCOUNTER — Other Ambulatory Visit: Payer: Self-pay | Admitting: Nurse Practitioner

## 2023-04-25 ENCOUNTER — Telehealth (HOSPITAL_COMMUNITY): Payer: Self-pay | Admitting: Emergency Medicine

## 2023-04-25 DIAGNOSIS — K8689 Other specified diseases of pancreas: Secondary | ICD-10-CM

## 2023-04-25 MED ORDER — OXYCODONE HCL 15 MG PO TABS
15.0000 mg | ORAL_TABLET | Freq: Four times a day (QID) | ORAL | 0 refills | Status: DC | PRN
Start: 2023-04-25 — End: 2023-05-06

## 2023-04-25 MED ORDER — MORPHINE SULFATE ER 30 MG PO TBCR
30.0000 mg | EXTENDED_RELEASE_TABLET | Freq: Two times a day (BID) | ORAL | 0 refills | Status: DC
Start: 2023-04-25 — End: 2023-05-06

## 2023-04-25 NOTE — Telephone Encounter (Addendum)
I was informed that the patient's pharmacy was out of the morphine that had been prescribed to her at her ER visit yesterday on 5/19. Her pharmacy was sent morphine but is out of the medication.  Reviewed the patient's chart: Patient presenting with likely pain associated with her malignancy.   Has follow-up with her oncologist today. Will contact her oncology office for longer term prescription management.

## 2023-04-25 NOTE — Telephone Encounter (Signed)
Called Patient and discussed her blood pressures:  She has been taking more of her pain medication because of her pain she has from the cancer. SBP 170-180 mm Hg Historically has had labile BP related to her pain medication.  Will give her hydralazine 25 mg PO TID until she received her pain medication; this will need to be sent to [facility].  Though she is at moderate risk for surgeries, her upcoming surgery is low risk and tissue diagnosis would change her outcome, it would be reasonable to proceed.   Patient had no further questions.  Riley Lam, MD Cardiologist [facility]  [address] [place], [address] [place], [place] [address] [phone]  5:02 PM

## 2023-04-25 NOTE — Telephone Encounter (Signed)
Called pt to f/u furosemide and metoprolol dose.  Pt reports takes furosemide 40 mg PO QD and metoprolol 12.5 mg PO BID when she is able.   Reports SBP runs 170-180.  Recently diagnosed with stage 4 pancreatic cancer.  Does not have an appetite when she doesn't eat she doesn't take medications.   Explained to pt this is the reason for elevated BP.   Advised pt will send message to MD to update on current condition.

## 2023-04-26 ENCOUNTER — Inpatient Hospital Stay (HOSPITAL_COMMUNITY)
Admission: EM | Admit: 2023-04-26 | Discharge: 2023-05-06 | DRG: 424 | Disposition: A | Discharge status: Home / Self Care | Payer: 59 | Attending: Internal Medicine | Admitting: Internal Medicine

## 2023-04-26 ENCOUNTER — Other Ambulatory Visit: Payer: Self-pay

## 2023-04-26 ENCOUNTER — Ambulatory Visit: Payer: 59 | Attending: Cardiology | Admitting: Nurse Practitioner

## 2023-04-26 ENCOUNTER — Encounter (HOSPITAL_COMMUNITY): Payer: Self-pay

## 2023-04-26 DIAGNOSIS — I152 Hypertension secondary to endocrine disorders: Secondary | ICD-10-CM | POA: Diagnosis present

## 2023-04-26 DIAGNOSIS — Z01810 Encounter for preprocedural cardiovascular examination: Secondary | ICD-10-CM | POA: Diagnosis not present

## 2023-04-26 DIAGNOSIS — C258 Malignant neoplasm of overlapping sites of pancreas: Secondary | ICD-10-CM

## 2023-04-26 DIAGNOSIS — Z8249 Family history of ischemic heart disease and other diseases of the circulatory system: Secondary | ICD-10-CM

## 2023-04-26 DIAGNOSIS — J449 Chronic obstructive pulmonary disease, unspecified: Secondary | ICD-10-CM | POA: Diagnosis present

## 2023-04-26 DIAGNOSIS — R4589 Other symptoms and signs involving emotional state: Secondary | ICD-10-CM

## 2023-04-26 DIAGNOSIS — E1159 Type 2 diabetes mellitus with other circulatory complications: Secondary | ICD-10-CM | POA: Diagnosis present

## 2023-04-26 DIAGNOSIS — Z90710 Acquired absence of both cervix and uterus: Secondary | ICD-10-CM

## 2023-04-26 DIAGNOSIS — R160 Hepatomegaly, not elsewhere classified: Secondary | ICD-10-CM

## 2023-04-26 DIAGNOSIS — I10 Essential (primary) hypertension: Secondary | ICD-10-CM

## 2023-04-26 DIAGNOSIS — Z833 Family history of diabetes mellitus: Secondary | ICD-10-CM

## 2023-04-26 DIAGNOSIS — K5900 Constipation, unspecified: Secondary | ICD-10-CM

## 2023-04-26 DIAGNOSIS — G894 Chronic pain syndrome: Secondary | ICD-10-CM | POA: Diagnosis present

## 2023-04-26 DIAGNOSIS — F419 Anxiety disorder, unspecified: Secondary | ICD-10-CM | POA: Diagnosis present

## 2023-04-26 DIAGNOSIS — Z560 Unemployment, unspecified: Secondary | ICD-10-CM

## 2023-04-26 DIAGNOSIS — D696 Thrombocytopenia, unspecified: Secondary | ICD-10-CM

## 2023-04-26 DIAGNOSIS — Z608 Other problems related to social environment: Secondary | ICD-10-CM | POA: Diagnosis present

## 2023-04-26 DIAGNOSIS — K219 Gastro-esophageal reflux disease without esophagitis: Secondary | ICD-10-CM | POA: Diagnosis present

## 2023-04-26 DIAGNOSIS — Z7189 Other specified counseling: Secondary | ICD-10-CM

## 2023-04-26 DIAGNOSIS — G4700 Insomnia, unspecified: Secondary | ICD-10-CM | POA: Diagnosis present

## 2023-04-26 DIAGNOSIS — Z7984 Long term (current) use of oral hypoglycemic drugs: Secondary | ICD-10-CM

## 2023-04-26 DIAGNOSIS — R1010 Upper abdominal pain, unspecified: Secondary | ICD-10-CM

## 2023-04-26 DIAGNOSIS — I480 Paroxysmal atrial fibrillation: Secondary | ICD-10-CM | POA: Diagnosis not present

## 2023-04-26 DIAGNOSIS — Z794 Long term (current) use of insulin: Secondary | ICD-10-CM

## 2023-04-26 DIAGNOSIS — F17210 Nicotine dependence, cigarettes, uncomplicated: Secondary | ICD-10-CM | POA: Diagnosis present

## 2023-04-26 DIAGNOSIS — C259 Malignant neoplasm of pancreas, unspecified: Secondary | ICD-10-CM | POA: Diagnosis not present

## 2023-04-26 DIAGNOSIS — E785 Hyperlipidemia, unspecified: Secondary | ICD-10-CM | POA: Diagnosis present

## 2023-04-26 DIAGNOSIS — Z79891 Long term (current) use of opiate analgesic: Secondary | ICD-10-CM

## 2023-04-26 DIAGNOSIS — R52 Pain, unspecified: Secondary | ICD-10-CM | POA: Diagnosis not present

## 2023-04-26 DIAGNOSIS — M797 Fibromyalgia: Secondary | ICD-10-CM | POA: Diagnosis present

## 2023-04-26 DIAGNOSIS — Z79899 Other long term (current) drug therapy: Secondary | ICD-10-CM

## 2023-04-26 DIAGNOSIS — E1140 Type 2 diabetes mellitus with diabetic neuropathy, unspecified: Secondary | ICD-10-CM | POA: Diagnosis present

## 2023-04-26 DIAGNOSIS — Z6824 Body mass index (BMI) 24.0-24.9, adult: Secondary | ICD-10-CM

## 2023-04-26 DIAGNOSIS — R64 Cachexia: Secondary | ICD-10-CM | POA: Diagnosis present

## 2023-04-26 DIAGNOSIS — C787 Secondary malignant neoplasm of liver and intrahepatic bile duct: Secondary | ICD-10-CM | POA: Diagnosis present

## 2023-04-26 DIAGNOSIS — Z515 Encounter for palliative care: Secondary | ICD-10-CM

## 2023-04-26 DIAGNOSIS — E11649 Type 2 diabetes mellitus with hypoglycemia without coma: Secondary | ICD-10-CM | POA: Diagnosis not present

## 2023-04-26 DIAGNOSIS — F32A Depression, unspecified: Secondary | ICD-10-CM | POA: Diagnosis present

## 2023-04-26 DIAGNOSIS — E1122 Type 2 diabetes mellitus with diabetic chronic kidney disease: Secondary | ICD-10-CM | POA: Diagnosis present

## 2023-04-26 DIAGNOSIS — C779 Secondary and unspecified malignant neoplasm of lymph node, unspecified: Secondary | ICD-10-CM | POA: Diagnosis present

## 2023-04-26 DIAGNOSIS — G893 Neoplasm related pain (acute) (chronic): Secondary | ICD-10-CM | POA: Diagnosis present

## 2023-04-26 DIAGNOSIS — M1990 Unspecified osteoarthritis, unspecified site: Secondary | ICD-10-CM | POA: Diagnosis present

## 2023-04-26 DIAGNOSIS — K8689 Other specified diseases of pancreas: Secondary | ICD-10-CM

## 2023-04-26 DIAGNOSIS — E1169 Type 2 diabetes mellitus with other specified complication: Secondary | ICD-10-CM | POA: Diagnosis present

## 2023-04-26 DIAGNOSIS — R791 Abnormal coagulation profile: Secondary | ICD-10-CM | POA: Diagnosis present

## 2023-04-26 DIAGNOSIS — E1165 Type 2 diabetes mellitus with hyperglycemia: Secondary | ICD-10-CM | POA: Diagnosis present

## 2023-04-26 DIAGNOSIS — Z952 Presence of prosthetic heart valve: Secondary | ICD-10-CM

## 2023-04-26 DIAGNOSIS — N1831 Chronic kidney disease, stage 3a: Secondary | ICD-10-CM | POA: Diagnosis present

## 2023-04-26 DIAGNOSIS — B182 Chronic viral hepatitis C: Secondary | ICD-10-CM | POA: Diagnosis present

## 2023-04-26 DIAGNOSIS — R918 Other nonspecific abnormal finding of lung field: Secondary | ICD-10-CM | POA: Diagnosis present

## 2023-04-26 DIAGNOSIS — I700 Atherosclerosis of aorta: Secondary | ICD-10-CM | POA: Diagnosis present

## 2023-04-26 DIAGNOSIS — E871 Hypo-osmolality and hyponatremia: Secondary | ICD-10-CM | POA: Diagnosis present

## 2023-04-26 DIAGNOSIS — Z885 Allergy status to narcotic agent status: Secondary | ICD-10-CM

## 2023-04-26 DIAGNOSIS — E876 Hypokalemia: Secondary | ICD-10-CM | POA: Diagnosis present

## 2023-04-26 DIAGNOSIS — Z7901 Long term (current) use of anticoagulants: Secondary | ICD-10-CM

## 2023-04-26 DIAGNOSIS — L299 Pruritus, unspecified: Secondary | ICD-10-CM

## 2023-04-26 DIAGNOSIS — Z79890 Hormone replacement therapy (postmenopausal): Secondary | ICD-10-CM

## 2023-04-26 DIAGNOSIS — M542 Cervicalgia: Secondary | ICD-10-CM | POA: Diagnosis present

## 2023-04-26 LAB — CBC
HCT: 43.8 % (ref 36.0–46.0)
Hemoglobin: 14.6 g/dL (ref 12.0–15.0)
MCH: 30.2 pg (ref 26.0–34.0)
MCHC: 33.3 g/dL (ref 30.0–36.0)
MCV: 90.5 fL (ref 80.0–100.0)
Platelets: 135 10*3/uL — ABNORMAL LOW (ref 150–400)
RBC: 4.84 MIL/uL (ref 3.87–5.11)
RDW: 15.5 % (ref 11.5–15.5)
WBC: 5.2 10*3/uL (ref 4.0–10.5)
nRBC: 0 % (ref 0.0–0.2)

## 2023-04-26 LAB — COMPREHENSIVE METABOLIC PANEL
ALT: 18 U/L (ref 0–44)
AST: 20 U/L (ref 15–41)
Albumin: 3.8 g/dL (ref 3.5–5.0)
Alkaline Phosphatase: 87 U/L (ref 38–126)
Anion gap: 13 (ref 5–15)
BUN: 24 mg/dL — ABNORMAL HIGH (ref 8–23)
CO2: 22 mmol/L (ref 22–32)
Calcium: 8.8 mg/dL — ABNORMAL LOW (ref 8.9–10.3)
Chloride: 100 mmol/L (ref 98–111)
Creatinine, Ser: 0.96 mg/dL (ref 0.44–1.00)
GFR, Estimated: >60 mL/min (ref >60)
Glucose, Bld: 146 mg/dL — ABNORMAL HIGH (ref 70–99)
Potassium: 3.9 mmol/L (ref 3.5–5.1)
Sodium: 135 mmol/L (ref 135–145)
Total Bilirubin: 0.9 mg/dL (ref 0.3–1.2)
Total Protein: 7.4 g/dL (ref 6.5–8.1)

## 2023-04-26 LAB — LIPASE, BLOOD: Lipase: 40 U/L (ref 11–51)

## 2023-04-26 LAB — PROTIME-INR
INR: 2.8 — ABNORMAL HIGH (ref 0.8–1.2)
Prothrombin Time: 29.3 seconds — ABNORMAL HIGH (ref 11.4–15.2)

## 2023-04-26 MED ORDER — HYDROMORPHONE HCL 1 MG/ML IJ SOLN
1.0000 mg | Freq: Once | INTRAMUSCULAR | Status: AC
  Administered 2023-04-26: 1 mg via INTRAVENOUS
  Filled 2023-04-26: qty 1

## 2023-04-26 MED ORDER — LORAZEPAM 1 MG PO TABS
1.0000 mg | ORAL_TABLET | Freq: Once | ORAL | Status: AC
  Administered 2023-04-26: 1 mg via ORAL
  Filled 2023-04-26: qty 1

## 2023-04-26 MED ORDER — HYDROMORPHONE HCL 1 MG/ML IJ SOLN
0.5000 mg | Freq: Once | INTRAMUSCULAR | Status: AC
  Administered 2023-04-26: 0.5 mg via INTRAVENOUS
  Filled 2023-04-26: qty 1

## 2023-04-26 MED ORDER — MORPHINE SULFATE (PF) 4 MG/ML IV SOLN
4.0000 mg | Freq: Once | INTRAVENOUS | Status: AC
  Administered 2023-04-26: 4 mg via INTRAVENOUS
  Filled 2023-04-26: qty 1

## 2023-04-26 MED ORDER — ACETAMINOPHEN 500 MG PO TABS
1000.0000 mg | ORAL_TABLET | Freq: Once | ORAL | Status: AC
  Administered 2023-04-26: 1000 mg via ORAL
  Filled 2023-04-26: qty 2

## 2023-04-26 MED ORDER — ONDANSETRON HCL 4 MG/2ML IJ SOLN
4.0000 mg | Freq: Once | INTRAMUSCULAR | Status: AC
  Administered 2023-04-26: 4 mg via INTRAVENOUS
  Filled 2023-04-26: qty 2

## 2023-04-26 MED ORDER — ALUM & MAG HYDROXIDE-SIMETH 200-200-20 MG/5ML PO SUSP
30.0000 mL | Freq: Once | ORAL | Status: AC
  Administered 2023-04-26: 30 mL via ORAL
  Filled 2023-04-26: qty 30

## 2023-04-26 MED ORDER — SODIUM CHLORIDE 0.9 % IV BOLUS
1000.0000 mL | Freq: Once | INTRAVENOUS | Status: AC
  Administered 2023-04-26: 1000 mL via INTRAVENOUS

## 2023-04-26 MED ORDER — PANTOPRAZOLE SODIUM 40 MG IV SOLR
40.0000 mg | Freq: Once | INTRAVENOUS | Status: AC
  Administered 2023-04-26: 40 mg via INTRAVENOUS
  Filled 2023-04-26: qty 10

## 2023-04-26 MED ORDER — FAMOTIDINE 20 MG PO TABS
20.0000 mg | ORAL_TABLET | Freq: Once | ORAL | Status: AC
  Administered 2023-04-26: 20 mg via ORAL
  Filled 2023-04-26: qty 1

## 2023-04-26 MED ORDER — HYDRALAZINE HCL 25 MG PO TABS: 25.0000 mg | ORAL_TABLET | Freq: Every day | ORAL | 3 refills | Status: DC | PRN

## 2023-04-26 NOTE — Assessment & Plan Note (Signed)
-  creatinine is stable

## 2023-04-26 NOTE — Assessment & Plan Note (Signed)
-  in the setting of recently diagnosed metastatic pancreatic cancer -PRN IV opioid for pain -continue home MS Contin 30mg  BID -continue home oxycodone IR 15mg  for breakthrough pain -will need adjustments to pain medication prior to discharge -may also benefit from palliative consult as her cancer appears aggressive

## 2023-04-26 NOTE — Assessment & Plan Note (Signed)
-  not in acute exacerbation 

## 2023-04-26 NOTE — Assessment & Plan Note (Signed)
-  rate controlled -continue warfarin per pharmacy -continue home beta-blocker and diltiazem

## 2023-04-26 NOTE — ED Triage Notes (Signed)
Pt coming in for abdominal pain that has been going on for apprx 2 wks. Pt took morphine with little relief. Supposed to follow up with oncologist, but has not seen them yet. Pt seen recently for same complaint.

## 2023-04-26 NOTE — Assessment & Plan Note (Signed)
-  pt recently diagnosed in early May with pancreatic adenocarinoma with possible liver and nodal metastases. -Had EUS with GI on 5//01/2020 with pancreatic body/tail mass biopsied but pathology unfortunately was non-diagnosed. Oncology has recommended liver biopsy with IR which she is still awaiting. -Plan follow biopsy results will most likely be palliative chemotherapy -following with oncologist Dr. Mosetta Putt

## 2023-04-26 NOTE — Progress Notes (Signed)
Virtual Visit via Telephone Note   Because of [name]'s co-morbid illnesses, she is at least at moderate risk for complications without adequate follow up.  This format is felt to be most appropriate for this patient at this time.  The patient did not have access to video technology/had technical difficulties with video requiring transitioning to audio format only (telephone).  All issues noted in this document were discussed and addressed.  No physical exam could be performed with this format.  Please refer to the patient's chart for her consent to telehealth for [facility].  Evaluation Performed:  Preoperative cardiovascular risk assessment _____________   Date:  04/26/2023   Patient ID:  [name], DOB 1961-09-01, MRN [id number] Patient Location:  Home Provider location:   Office  Primary Care Provider:  Chrys Racer, MD Primary Cardiologist:  Christell Constant, MD  Chief Complaint / Patient Profile   61 y.o. y/o female with a h/o rheumatic heart disease (mitral valve and tricuspid valve), s/p MVR and TVR in 2023, pericardial effusion, paroxysmal atrial fibrillation on warfarin, labile BP, and hyperlipidemia who is pending ultrasound liver biopsy and port placement on 05/03/2023 with [facility] Rad[facility] and presents today for telephonic preoperative cardiovascular risk assessment.  History of Present Illness    [name] is a 63 y.o. female who presents via audio/video conferencing for a telehealth visit today. Pt was last seen in cardiology clinic on 02/28/2023 by Dr. Izora Ribas. At that time Kirstina Bagdonas was doing well. The patient is now pending procedure as outlined above. Since her last visit, she has been stable from a cardiac standpoint. She has noted some elevated BP in the setting of acute pain. She has had occasional lightheadedness in the setting of decreased oral intake. She was prescribed hydralazine to take as needed for  SBP greater than 150 well as pain medication per recent notes.  She denies chest pain, palpitations, dyspnea, pnd, orthopnea, n, v, dizziness, syncope, edema, weight gain, or early satiety. All other systems reviewed and are otherwise negative except as noted above.   Past Medical History    Past Medical History:  Diagnosis Date   Acute pericardial effusion 05/17/2022   AKI (acute kidney injury) (HCC) 05/17/2022   Anxiety    Arthritis    Cervicalgia    CHF (congestive heart failure) (HCC)    Chondromalacia of patella    Chronic neck pain    Chronic pain syndrome    Depression    secondary to loss of her son at age 63   Diabetic neuropathy (HCC)    DMII (diabetes mellitus, type 2) (HCC)    Dysrhythmia    Enthesopathy of hip region    Fibromyalgia    GERD (gastroesophageal reflux disease)    Hep C w/o coma, chronic (HCC)    Hepatitis C    diagnosed 2005   Hypertension    Insomnia    Low back pain    Lumbago    Mitral regurgitation    Obesity    Pain in joint, upper arm    Primary localized osteoarthrosis, lower leg    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   Substance abuse (HCC)    sober since 2001   Tobacco abuse    Tricuspid regurgitation    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   Past Surgical History:  Procedure Laterality Date   ABDOMINAL HYSTERECTOMY     at age 1, unknown reasons   BIOPSY  04/07/2023  Procedure: BIOPSY;  Surgeon: Lemar Lofty., MD;  Location: [facility] ENDOSCOPY;  Service: Gastroenterology;;   Fidela Salisbury RELEASE  12/07/2011   left side   COLONOSCOPY WITH PROPOFOL N/A 07/30/2022   Procedure: COLONOSCOPY WITH PROPOFOL;  Surgeon: Meryl Dare, MD;  Location: [facility] ENDOSCOPY;  Service: Gastroenterology;  Laterality: N/A;   ESOPHAGOGASTRODUODENOSCOPY (EGD) WITH PROPOFOL N/A 04/07/2023   Procedure: ESOPHAGOGASTRODUODENOSCOPY (EGD) WITH PROPOFOL;  Surgeon: Meridee Score Netty Starring., MD;  Location: [facility] ENDOSCOPY;  Service:  Gastroenterology;  Laterality: N/A;   EUS N/A 04/07/2023   Procedure: ESOPHAGEAL ENDOSCOPIC ULTRASOUND (EUS) RADIAL;  Surgeon: Meridee Score Netty Starring., MD;  Location: [facility] ENDOSCOPY;  Service: Gastroenterology;  Laterality: N/A;   EYE SURGERY     FINE NEEDLE ASPIRATION N/A 04/07/2023   Procedure: FINE NEEDLE ASPIRATION (FNA) LINEAR;  Surgeon: Lemar Lofty., MD;  Location: [facility] ENDOSCOPY;  Service: Gastroenterology;  Laterality: N/A;   FOOT SURGERY Bilateral    HOT HEMOSTASIS N/A 07/30/2022   Procedure: HOT HEMOSTASIS (ARGON PLASMA COAGULATION/BICAP);  Surgeon: Meryl Dare, MD;  Location: [facility] ENDOSCOPY;  Service: Gastroenterology;  Laterality: N/A;   MITRAL VALVE REPAIR N/A 04/19/2022   Procedure: MITRAL VALVE REPAIR WITH PHYSIO II ANNULOPLASTY RING;  Surgeon: Loreli Slot, MD;  Location: [facility] OR;  Service: Open Heart Surgery;  Laterality: N/A;   PERICARDIOCENTESIS N/A 05/10/2022   Procedure: PERICARDIOCENTESIS;  Surgeon: Corky Crafts, MD;  Location: [facility] INVASIVE CV LAB;  Service: Cardiovascular;  Laterality: N/A;   POLYPECTOMY  07/30/2022   Procedure: POLYPECTOMY;  Surgeon: Meryl Dare, MD;  Location: [facility] ENDOSCOPY;  Service: Gastroenterology;;   RIGHT/LEFT HEART CATH AND CORONARY ANGIOGRAPHY N/A 02/12/2022   Procedure: RIGHT/LEFT HEART CATH AND CORONARY ANGIOGRAPHY;  Surgeon: Lyn Records, MD;  Location: [facility] INVASIVE CV LAB;  Service: Cardiovascular;  Laterality: N/A;   TEE WITHOUT CARDIOVERSION N/A 02/03/2022   Procedure: TRANSESOPHAGEAL ECHOCARDIOGRAM (TEE);  Surgeon: Wendall Stade, MD;  Location: [facility] ENDOSCOPY;  Service: Cardiovascular;  Laterality: N/A;   TEE WITHOUT CARDIOVERSION N/A 04/19/2022   Procedure: TRANSESOPHAGEAL ECHOCARDIOGRAM (TEE);  Surgeon: Loreli Slot, MD;  Location: [facility] OR;  Service: Open Heart Surgery;  Laterality: N/A;   TEE WITHOUT CARDIOVERSION N/A 07/28/2022   Procedure: TRANSESOPHAGEAL ECHOCARDIOGRAM (TEE);  Surgeon: Thurmon Fair,  MD;  Location: [facility] ENDOSCOPY;  Service: Cardiovascular;  Laterality: N/A;   TRICUSPID VALVE REPLACEMENT N/A 04/19/2022   Procedure: TRICUSPID VALVE REPAIR WITH MC3 ANNULOPLASTY RING;  Surgeon: Loreli Slot, MD;  Location: [facility] OR;  Service: Open Heart Surgery;  Laterality: N/A;    Allergies  Allergies  Allergen Reactions   Norco [Hydrocodone-Acetaminophen] Itching and Other (See Comments)    Tolerates Oxycodone    Home Medications    Prior to Admission medications   Medication Sig Start Date End Date Taking? Authorizing Provider  hydrALAZINE (APRESOLINE) 25 MG tablet Take 1 tablet (25 mg total) by mouth daily as needed (BP>150). 04/26/23   Chandrasekhar, Rondel Jumbo, MD  albuterol (PROVENTIL HFA;VENTOLIN HFA) 108 (90 Base) MCG/ACT inhaler Inhale 2 puffs into the lungs every 6 (six) hours as needed for wheezing or shortness of breath.     [provider]  ARIPiprazole (ABILIFY) 5 MG tablet Take 2.5 mg by mouth daily. 07/20/22   [provider]  atorvastatin (LIPITOR) 20 MG tablet Take 20 mg by mouth daily.    [provider]  Blood Glucose Monitoring Suppl DEVI 1 each by Does not apply route in the morning, at noon, and at bedtime.  May substitute to any manufacturer covered by patient's insurance. 04/09/23   Glade Lloyd, MD  buPROPion (WELLBUTRIN XL) 300 MG 24 hr tablet Take 300 mg by mouth in the morning. 03/24/22   [provider]  dexlansoprazole (DEXILANT) 60 MG capsule Take 60 mg by mouth daily. 07/26/22   [provider]  diclofenac Sodium (VOLTAREN) 1 % GEL Apply 2 g topically 4 (four) times daily as needed (pain). 06/29/17   [provider]  famotidine (PEPCID) 20 MG tablet Take 20 mg by mouth daily.    [provider]  FARXIGA 10 MG TABS tablet Take 10 mg by mouth daily. 08/10/22   [provider]  fluconazole (DIFLUCAN) 100 MG tablet Take 2 tablets (200 mg total) by mouth daily for 1 day, THEN 1 tablet (100  mg total) daily for 13 days. 04/15/23 04/29/23  Mansouraty, Netty Starring., MD  FLUoxetine (PROZAC) 20 MG capsule Take 20 mg by mouth daily. Patient not taking: Reported on 04/21/2023 09/20/22   [provider]  furosemide (LASIX) 40 MG tablet Take 1 tablet (40 mg total) by mouth daily. 02/28/23   Chandrasekhar, [place] Dragon A, MD  gabapentin (NEURONTIN) 300 MG capsule Take 300 mg by mouth 3 (three) times daily. 08/19/22   [provider]  Glucose Blood (BLOOD GLUCOSE TEST STRIPS) STRP 1 each by In Vitro route in the morning, at noon, and at bedtime. May substitute to any manufacturer covered by patient's insurance. 04/09/23   Glade Lloyd, MD  insulin glargine (LANTUS) 100 UNIT/ML Solostar Pen Inject 15 Units into the skin daily. 04/09/23   Glade Lloyd, MD  Insulin Pen Needle 31G X 5 MM MISC Lantus 15 units daily 04/09/23   Glade Lloyd, MD  Lancet Device MISC 1 each by Does not apply route in the morning, at noon, and at bedtime. May substitute to any manufacturer covered by patient's insurance. 04/09/23 05/09/23  Glade Lloyd, MD  Lancets Misc. MISC 1 each by Does not apply route in the morning, at noon, and at bedtime. May substitute to any manufacturer covered by patient's insurance. 04/09/23 05/09/23  Glade Lloyd, MD  linagliptin (TRADJENTA) 5 MG TABS tablet Take 5 mg by mouth daily.    [provider]  metoprolol tartrate (LOPRESSOR) 25 MG tablet Take 12.5 mg by mouth 2 (two) times daily.    [provider]  metoprolol tartrate (LOPRESSOR) 50 MG tablet Take 50 mg by mouth 2 (two) times daily.    [provider]  morphine (MS CONTIN) 30 MG 12 hr tablet Take 1 tablet (30 mg total) by mouth every 12 (twelve) hours. 04/25/23   Pollyann Samples, NP  naloxone [facility]) nasal spray 4 mg/0.1 mL Place 1 spray into the nose as needed (accidental overdose).    [provider]  naloxone [facility]) nasal spray 4 mg/0.1 mL PRN for suspected opiate OD 04/24/23   Glyn Ade, MD  omeprazole (PRILOSEC) 10 MG capsule Take 10 mg by mouth daily.    [provider]  oxyCODONE (ROXICODONE) 15 MG immediate release tablet Take 1 tablet (15 mg total) by mouth every 6 (six) hours as needed for pain. 04/25/23   Pollyann Samples, NP  Oyster Shell (OYSTER CALCIUM) 500 MG TABS tablet Take 500 mg of elemental calcium by mouth daily.    [provider]  PREMARIN 0.45 MG tablet Take 0.45 mg by mouth daily. 01/11/22   [provider]  QUEtiapine (SEROQUEL) 25 MG tablet Take 25 mg by mouth  as needed. 03/17/23   [provider]  TIADYLT ER 300 MG 24 hr capsule Take 300 mg by mouth daily. 03/11/23   [provider]  traZODone (DESYREL) 100 MG tablet Take 200 mg by mouth at bedtime. Pt takes 2 tablets 200 mg at bedtime.    [provider]  TRINTELLIX 10 MG TABS tablet Take 10 mg by mouth daily. 10/22/22   [provider]  warfarin (COUMADIN) 5 MG tablet Take 0.5-1 tablets (2.5-5 mg total) by mouth See admin instructions. Take 5mg  by mouth daily except 2.5mg  by mouth on Sundays. 04/09/23   Glade Lloyd, MD    Physical Exam    Vital Signs:  Joanne Ouk does not have vital signs available for review today.  Given telephonic nature of communication, physical exam is limited. AAOx3. NAD. Normal affect.  Speech and respirations are unlabored.  Accessory Clinical Findings    None  Assessment & Plan    1.  Preoperative Cardiovascular Risk Assessment:  According to the Revised Cardiac Risk Index (RCRI), her Perioperative Risk of Major Cardiac Event is (%): 0.4. Her Functional Capacity in METs is: 4.06 according to the [place] Activity Status Index (DASI).Therefore, based on ACC/AHA guidelines, patient would be at acceptable risk for the planned procedure without further cardiovascular testing.   The patient was advised that if she develops new symptoms prior to surgery to contact our office to arrange for a follow-up visit,  and she verbalized understanding.  Per office protocol, patient can hold warfarin for 5 days prior to procedure.   Patient will NOT need bridging with Lovenox (enoxaparin) around procedure. Please resume warfarin as soon as possible postprocedure, at the discretion of the surgeon.   A copy of this note will be routed to requesting surgeon.  Time:   Today, I have spent 5 minutes with the patient with telehealth technology discussing medical history, symptoms, and management plan.     Joylene Grapes, NP  04/26/2023, 3:21 PM

## 2023-04-26 NOTE — Addendum Note (Signed)
Addended by: Macie Burows on: 04/26/2023 02:07 PM   Modules accepted: Orders

## 2023-04-26 NOTE — H&P (Signed)
History and Physical    Patient: [name] ZOX:[id number] DOB: Mar 20, 1961 DOA: 04/26/2023 DOS: the patient was seen and examined on 04/27/2023 PCP: Chrys Racer, MD  Patient coming from: Home  Chief Complaint:  Chief Complaint  Patient presents with   Abdominal Pain   HPI: [name] is a 62 y.o. female with medical history significant of  rheumatic heart disease s/p MV and TV repair 04/2022, history of pericardial effusion/tamponade s/p pericardiocentesis 05/2022, PAF on Coumadin, CKD stage IIIa, COPD, T2DM, HTN, tobacco use who presents with worsening abdominal pain.   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 Partial encasement of the left side of splenic artery. Splenic vein appears completely occluded. Had EUS with GI on 5//01/2020 with pancreatic body/tail mass biopsied but pathology unfortunately was non-diagnosed. Oncology has recommended liver biopsy with IR which she is still awaiting.  Pain has been persistently worsening and today was the worse day. Pain mostly in epigastric region but is diffuse. No nausea, vomiting or diarrhea. Has been taking her MS contin and oxycodone IR scheduled with pain still at 5/10.   In the ED, she is afebrile with elevated BP up to 160/117.  No leukocytosis or anemia. Mild thrombocytopenia of 135.  CMP unremarkable.   INR therapeutic at 2.8    Review of Systems: As mentioned in the history of present illness. All other systems reviewed and are negative. Past Medical History:  Diagnosis Date   Acute pericardial effusion 05/17/2022   AKI (acute kidney injury) (HCC) 05/17/2022   Anxiety    Arthritis    Cervicalgia    CHF (congestive heart failure) (HCC)    Chondromalacia of patella    Chronic neck pain    Chronic pain syndrome    Depression    secondary to  loss of her son at age 54   Diabetic neuropathy (HCC)    DMII (diabetes mellitus, type 2) (HCC)    Dysrhythmia    Enthesopathy of hip region    Fibromyalgia    GERD (gastroesophageal reflux disease)    Hep C w/o coma, chronic (HCC)    Hepatitis C    diagnosed 2005   Hypertension    Insomnia    Low back pain    Lumbago    Mitral regurgitation    Obesity    Pain in joint, upper arm    Primary localized osteoarthrosis, lower leg    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   Substance abuse (HCC)    sober since 2001   Tobacco abuse    Tricuspid regurgitation    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   Past Surgical History:  Procedure Laterality Date   ABDOMINAL HYSTERECTOMY     at age 3, unknown reasons   BIOPSY  04/07/2023   Procedure: BIOPSY;  Surgeon: Lemar Lofty., MD;  Location: [facility];  Service: Gastroenterology;;   Fidela Salisbury RELEASE  12/07/2011   left side   COLONOSCOPY WITH PROPOFOL N/A 07/30/2022   Procedure: COLONOSCOPY WITH PROPOFOL;  Surgeon: Meryl Dare, MD;  Location: [facility] ENDOSCOPY;  Service: Gastroenterology;  Laterality: N/A;   ESOPHAGOGASTRODUODENOSCOPY (EGD) WITH PROPOFOL N/A 04/07/2023   Procedure: ESOPHAGOGASTRODUODENOSCOPY (EGD) WITH PROPOFOL;  Surgeon: Meridee Score Netty Starring., MD;  Location: [facility] ENDOSCOPY;  Service: Gastroenterology;  Laterality: N/A;   EUS N/A 04/07/2023  Procedure: ESOPHAGEAL ENDOSCOPIC ULTRASOUND (EUS) RADIAL;  Surgeon: Meridee Score Netty Starring., MD;  Location: [facility] ENDOSCOPY;  Service: Gastroenterology;  Laterality: N/A;   EYE SURGERY     FINE NEEDLE ASPIRATION N/A 04/07/2023   Procedure: FINE NEEDLE ASPIRATION (FNA) LINEAR;  Surgeon: Lemar Lofty., MD;  Location: [facility] ENDOSCOPY;  Service: Gastroenterology;  Laterality: N/A;   FOOT SURGERY Bilateral    HOT HEMOSTASIS N/A 07/30/2022   Procedure: HOT HEMOSTASIS (ARGON PLASMA COAGULATION/BICAP);  Surgeon: Meryl Dare, MD;  Location: [facility] ENDOSCOPY;   Service: Gastroenterology;  Laterality: N/A;   MITRAL VALVE REPAIR N/A 04/19/2022   Procedure: MITRAL VALVE REPAIR WITH PHYSIO II ANNULOPLASTY RING;  Surgeon: Loreli Slot, MD;  Location: [facility] OR;  Service: Open Heart Surgery;  Laterality: N/A;   PERICARDIOCENTESIS N/A 05/10/2022   Procedure: PERICARDIOCENTESIS;  Surgeon: Corky Crafts, MD;  Location: [facility] INVASIVE CV LAB;  Service: Cardiovascular;  Laterality: N/A;   POLYPECTOMY  07/30/2022   Procedure: POLYPECTOMY;  Surgeon: Meryl Dare, MD;  Location: [facility] ENDOSCOPY;  Service: Gastroenterology;;   RIGHT/LEFT HEART CATH AND CORONARY ANGIOGRAPHY N/A 02/12/2022   Procedure: RIGHT/LEFT HEART CATH AND CORONARY ANGIOGRAPHY;  Surgeon: Lyn Records, MD;  Location: [facility] INVASIVE CV LAB;  Service: Cardiovascular;  Laterality: N/A;   TEE WITHOUT CARDIOVERSION N/A 02/03/2022   Procedure: TRANSESOPHAGEAL ECHOCARDIOGRAM (TEE);  Surgeon: Wendall Stade, MD;  Location: [facility] ENDOSCOPY;  Service: Cardiovascular;  Laterality: N/A;   TEE WITHOUT CARDIOVERSION N/A 04/19/2022   Procedure: TRANSESOPHAGEAL ECHOCARDIOGRAM (TEE);  Surgeon: Loreli Slot, MD;  Location: [facility] OR;  Service: Open Heart Surgery;  Laterality: N/A;   TEE WITHOUT CARDIOVERSION N/A 07/28/2022   Procedure: TRANSESOPHAGEAL ECHOCARDIOGRAM (TEE);  Surgeon: Thurmon Fair, MD;  Location: [facility] ENDOSCOPY;  Service: Cardiovascular;  Laterality: N/A;   TRICUSPID VALVE REPLACEMENT N/A 04/19/2022   Procedure: TRICUSPID VALVE REPAIR WITH MC3 ANNULOPLASTY RING;  Surgeon: Loreli Slot, MD;  Location: [facility] OR;  Service: Open Heart Surgery;  Laterality: N/A;   Social History:  reports that she has been smoking cigarettes. She has a 8.75 pack-year smoking history. She has never used smokeless tobacco. She reports that she does not drink alcohol and does not use drugs.  Allergies  Allergen Reactions   Norco [Hydrocodone-Acetaminophen] Itching and Other (See Comments)    Tolerates  Oxycodone   Hydrocodone Itching    Tolerates oxycodone     Family History  Problem Relation Age of Onset   Uterine cancer Mother    Alcohol abuse Father    Alcohol abuse Sister    Drug abuse Sister    Drug abuse Brother    Alcohol abuse Brother    Schizophrenia Son    Diabetes Other    Arthritis Other    Hypertension Other    Heart attack Son 5       Died suddenly    Prior to Admission medications   Medication Sig Start Date End Date Taking? Authorizing Provider  tobramycin-dexamethasone [facility]) ophthalmic solution 1 drop 4 (four) times daily. 04/14/23  Yes [provider]  albuterol (PROVENTIL HFA;VENTOLIN HFA) 108 (90 Base) MCG/ACT inhaler Inhale 2 puffs into the lungs every 6 (six) hours as needed for wheezing or shortness of breath.     [provider]  ARIPiprazole (ABILIFY) 5 MG tablet Take 2.5 mg by mouth daily. 07/20/22   [provider]  atorvastatin (LIPITOR) 20 MG tablet Take 20 mg by mouth daily.    [provider]  Blood  Glucose Monitoring Suppl DEVI 1 each by Does not apply route in the morning, at noon, and at bedtime. May substitute to any manufacturer covered by patient's insurance. 04/09/23   Glade Lloyd, MD  buPROPion (WELLBUTRIN XL) 300 MG 24 hr tablet Take 300 mg by mouth in the morning. 03/24/22   [provider]  dexlansoprazole (DEXILANT) 60 MG capsule Take 60 mg by mouth daily. 07/26/22   [provider]  diclofenac Sodium (VOLTAREN) 1 % GEL Apply 2 g topically 4 (four) times daily as needed (pain). 06/29/17   [provider]  famotidine (PEPCID) 20 MG tablet Take 20 mg by mouth daily.    [provider]  FARXIGA 10 MG TABS tablet Take 10 mg by mouth daily. 08/10/22   [provider]  fluconazole (DIFLUCAN) 100 MG tablet Take 2 tablets (200 mg total) by mouth daily for 1 day, THEN 1 tablet (100 mg total) daily for 13 days. 04/15/23 04/29/23  Mansouraty, Netty Starring., MD  FLUoxetine  (PROZAC) 20 MG capsule Take 20 mg by mouth daily. Patient not taking: Reported on 04/21/2023 09/20/22   [provider]  furosemide (LASIX) 40 MG tablet Take 1 tablet (40 mg total) by mouth daily. 02/28/23   Chandrasekhar, [place] Dragon A, MD  gabapentin (NEURONTIN) 300 MG capsule Take 300 mg by mouth 3 (three) times daily. 08/19/22   [provider]  Glucose Blood (BLOOD GLUCOSE TEST STRIPS) STRP 1 each by In Vitro route in the morning, at noon, and at bedtime. May substitute to any manufacturer covered by patient's insurance. 04/09/23   Glade Lloyd, MD  hydrALAZINE (APRESOLINE) 25 MG tablet Take 1 tablet (25 mg total) by mouth daily as needed (BP>150). 04/26/23   Chandrasekhar, Mahesh A, MD  insulin glargine (LANTUS) 100 UNIT/ML Solostar Pen Inject 15 Units into the skin daily. 04/09/23   Glade Lloyd, MD  Insulin Pen Needle 31G X 5 MM MISC Lantus 15 units daily 04/09/23   Glade Lloyd, MD  Lancet Device MISC 1 each by Does not apply route in the morning, at noon, and at bedtime. May substitute to any manufacturer covered by patient's insurance. 04/09/23 05/09/23  Glade Lloyd, MD  Lancets Misc. MISC 1 each by Does not apply route in the morning, at noon, and at bedtime. May substitute to any manufacturer covered by patient's insurance. 04/09/23 05/09/23  Glade Lloyd, MD  linagliptin (TRADJENTA) 5 MG TABS tablet Take 5 mg by mouth daily.    [provider]  metoprolol tartrate (LOPRESSOR) 25 MG tablet Take 12.5 mg by mouth 2 (two) times daily.    [provider]  metoprolol tartrate (LOPRESSOR) 50 MG tablet Take 50 mg by mouth 2 (two) times daily.    [provider]  morphine (MS CONTIN) 30 MG 12 hr tablet Take 1 tablet (30 mg total) by mouth every 12 (twelve) hours. 04/25/23   Pollyann Samples, NP  naloxone [facility]) nasal spray 4 mg/0.1 mL Place 1 spray into the nose as needed (accidental overdose).    [provider]  naloxone [facility]) nasal spray 4 mg/0.1 mL  PRN for suspected opiate OD 04/24/23   Glyn Ade, MD  omeprazole (PRILOSEC) 10 MG capsule Take 10 mg by mouth daily.    [provider]  oxyCODONE (ROXICODONE) 15 MG immediate release tablet Take 1 tablet (15 mg total) by mouth every 6 (six) hours as needed for pain. 04/25/23   Pollyann Samples, NP  Oyster Shell (OYSTER CALCIUM) 500 MG TABS  tablet Take 500 mg of elemental calcium by mouth daily.    [provider]  PREMARIN 0.45 MG tablet Take 0.45 mg by mouth daily. 01/11/22   [provider]  QUEtiapine (SEROQUEL) 25 MG tablet Take 25 mg by mouth as needed. 03/17/23   [provider]  TIADYLT ER 300 MG 24 hr capsule Take 300 mg by mouth daily. 03/11/23   [provider]  traZODone (DESYREL) 100 MG tablet Take 200 mg by mouth at bedtime. Pt takes 2 tablets 200 mg at bedtime.    [provider]  TRINTELLIX 10 MG TABS tablet Take 10 mg by mouth daily. 10/22/22   [provider]  warfarin (COUMADIN) 5 MG tablet Take 0.5-1 tablets (2.5-5 mg total) by mouth See admin instructions. Take 5mg  by mouth daily except 2.5mg  by mouth on Sundays. 04/09/23   Glade Lloyd, MD    Physical Exam: Vitals:   04/26/23 2300 04/26/23 2315 04/26/23 2345 04/27/23 0019  BP: (!) 144/111 (!) 156/102 (!) 156/93 (!) 160/106  Pulse: 76 75 75 82  Resp: 11 10 16 18   Temp:    98 F (36.7 C)  TempSrc:    Oral  SpO2: 96% 96% 94% 96%  Weight:      Height:       Constitutional: NAD, calm, comfortable, non-toxic well appearing elderly female laying flat in bed Eyes: lids and conjunctivae normal ENMT: Mucous membranes are moist.  Neck: normal, supple Respiratory: clear to auscultation bilaterally, no wheezing, no crackles. Normal respiratory effort. No accessory muscle use.  Cardiovascular: Regular rate and rhythm, no murmurs / rubs / gallops. No extremity edema. Abdomen: soft, non-distended, diffuse moderate tenderness. No rebound tenderness, guarding or  rigidity.  Bowel sounds positive.  Musculoskeletal: no clubbing / cyanosis. No joint deformity upper and lower extremities. Normal muscle tone.  Skin: no rashes, lesions, ulcers. No induration Neurologic: CN 2-12 grossly intact.  Strength 5/5 in all 4.  Psychiatric: Normal judgment and insight. Alert and oriented x 3. Normal mood.   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 -Had EUS with GI on 5//01/2020 with pancreatic body/tail mass biopsied but pathology unfortunately was non-diagnosed. Oncology has recommended liver biopsy with IR which she is still awaiting. -Plan follow biopsy results will most likely be palliative chemotherapy -following with oncologist Dr. Mosetta Putt   COPD (chronic obstructive pulmonary disease) (HCC) -not in acute exacerbation  Paroxysmal atrial fibrillation (HCC) -rate controlled -continue warfarin per pharmacy -continue home beta-blocker and diltiazem  Type 2 diabetes mellitus with hyperlipidemia (HCC) -place on low dose SSI -continue statin  Thrombocytopenia (HCC) -plt of 135 -continue to follow  Chronic kidney disease, stage 3a (HCC) -creatinine is stable  Hypertension associated with diabetes (HCC) -due to pain and missing medications for a few days  -continue home antihypertensives   Pt not able to verify her full list of medications with me. Only able to reconcile based on pharmacy filled records and medications from recent admission.     Advance Care Planning:  Full  Consults: none  Family Communication: none at bedside  Severity of Illness: The appropriate patient status for this patient is OBSERVATION. Observation status is judged to be reasonable and necessary in order to provide the required intensity of service to ensure the patient's safety. The patient's presenting symptoms, physical exam findings, and initial radiographic and laboratory data in the context of their medical condition is felt to place them at decreased risk for further clinical deterioration. Furthermore, it is anticipated that the patient will be medically stable for discharge from the hospital within 2 midnights of admission.   Author: Anselm Jungling, DO 04/27/2023 12:45 AM  For on call review [email or web address].

## 2023-04-26 NOTE — Telephone Encounter (Addendum)
Patient called stating pain medication was supposed to be called in for her.  She would like for it to be called to [facility] on [address].

## 2023-04-26 NOTE — ED Provider Notes (Signed)
[place] [facility] AT [facility] Provider Note   CSN: [id number] Arrival date & time: 04/26/23  1808     History  Chief Complaint  Patient presents with   Abdominal Pain    Stephanieann Gilland is a 62 y.o. female.  Patient with hx metastatic pancreatic mass/cancer, with epigastric pain. Symptoms present in past 2-3 weeks, constant, dull, epigastric, non radiating. No specific exacerbating or alleviating factors. Nausea/ no vomiting. Is having bms, ?mild constipation. No acute/abrupt worsening of, or change in quality of pain. No new abd distension. No fever or chills.   The history is provided by the patient and medical records.  Abdominal Pain Associated symptoms: nausea   Associated symptoms: no chest pain, no chills, no cough, no dysuria, no fever, no shortness of breath, no sore throat and no vomiting        Home Medications Prior to Admission medications   Medication Sig Start Date End Date Taking? Authorizing Provider  albuterol (PROVENTIL HFA;VENTOLIN HFA) 108 (90 Base) MCG/ACT inhaler Inhale 2 puffs into the lungs every 6 (six) hours as needed for wheezing or shortness of breath.     [provider]  ARIPiprazole (ABILIFY) 5 MG tablet Take 2.5 mg by mouth daily. 07/20/22   [provider]  atorvastatin (LIPITOR) 20 MG tablet Take 20 mg by mouth daily.    [provider]  Blood Glucose Monitoring Suppl DEVI 1 each by Does not apply route in the morning, at noon, and at bedtime. May substitute to any manufacturer covered by patient's insurance. 04/09/23   Glade Lloyd, MD  buPROPion (WELLBUTRIN XL) 300 MG 24 hr tablet Take 300 mg by mouth in the morning. 03/24/22   [provider]  dexlansoprazole (DEXILANT) 60 MG capsule Take 60 mg by mouth daily. 07/26/22   [provider]  diclofenac Sodium (VOLTAREN) 1 % GEL Apply 2 g topically 4 (four) times daily as needed (pain). 06/29/17   [provider]   famotidine (PEPCID) 20 MG tablet Take 20 mg by mouth daily.    [provider]  FARXIGA 10 MG TABS tablet Take 10 mg by mouth daily. 08/10/22   [provider]  fluconazole (DIFLUCAN) 100 MG tablet Take 2 tablets (200 mg total) by mouth daily for 1 day, THEN 1 tablet (100 mg total) daily for 13 days. 04/15/23 04/29/23  Mansouraty, Netty Starring., MD  FLUoxetine (PROZAC) 20 MG capsule Take 20 mg by mouth daily. Patient not taking: Reported on 04/21/2023 09/20/22   [provider]  furosemide (LASIX) 40 MG tablet Take 1 tablet (40 mg total) by mouth daily. 02/28/23   Chandrasekhar, [place] Dragon A, MD  gabapentin (NEURONTIN) 300 MG capsule Take 300 mg by mouth 3 (three) times daily. 08/19/22   [provider]  Glucose Blood (BLOOD GLUCOSE TEST STRIPS) STRP 1 each by In Vitro route in the morning, at noon, and at bedtime. May substitute to any manufacturer covered by patient's insurance. 04/09/23   Glade Lloyd, MD  hydrALAZINE (APRESOLINE) 25 MG tablet Take 1 tablet (25 mg total) by mouth daily as needed (BP>150). 04/26/23   Chandrasekhar, Mahesh A, MD  insulin glargine (LANTUS) 100 UNIT/ML Solostar Pen Inject 15 Units into the skin daily. 04/09/23   Glade Lloyd, MD  Insulin Pen Needle 31G X 5 MM MISC Lantus 15 units daily 04/09/23   Glade Lloyd, MD  Lancet Device MISC 1 each by Does not apply route in the morning, at noon, and at bedtime. May  substitute to any manufacturer covered by patient's insurance. 04/09/23 05/09/23  Glade Lloyd, MD  Lancets Misc. MISC 1 each by Does not apply route in the morning, at noon, and at bedtime. May substitute to any manufacturer covered by patient's insurance. 04/09/23 05/09/23  Glade Lloyd, MD  linagliptin (TRADJENTA) 5 MG TABS tablet Take 5 mg by mouth daily.    [provider]  metoprolol tartrate (LOPRESSOR) 25 MG tablet Take 12.5 mg by mouth 2 (two) times daily.    [provider]  metoprolol tartrate (LOPRESSOR) 50 MG tablet  Take 50 mg by mouth 2 (two) times daily.    [provider]  morphine (MS CONTIN) 30 MG 12 hr tablet Take 1 tablet (30 mg total) by mouth every 12 (twelve) hours. 04/25/23   Pollyann Samples, NP  naloxone [facility]) nasal spray 4 mg/0.1 mL Place 1 spray into the nose as needed (accidental overdose).    [provider]  naloxone [facility]) nasal spray 4 mg/0.1 mL PRN for suspected opiate OD 04/24/23   Glyn Ade, MD  omeprazole (PRILOSEC) 10 MG capsule Take 10 mg by mouth daily.    [provider]  oxyCODONE (ROXICODONE) 15 MG immediate release tablet Take 1 tablet (15 mg total) by mouth every 6 (six) hours as needed for pain. 04/25/23   Pollyann Samples, NP  Oyster Shell (OYSTER CALCIUM) 500 MG TABS tablet Take 500 mg of elemental calcium by mouth daily.    [provider]  PREMARIN 0.45 MG tablet Take 0.45 mg by mouth daily. 01/11/22   [provider]  QUEtiapine (SEROQUEL) 25 MG tablet Take 25 mg by mouth as needed. 03/17/23   [provider]  TIADYLT ER 300 MG 24 hr capsule Take 300 mg by mouth daily. 03/11/23   [provider]  traZODone (DESYREL) 100 MG tablet Take 200 mg by mouth at bedtime. Pt takes 2 tablets 200 mg at bedtime.    [provider]  TRINTELLIX 10 MG TABS tablet Take 10 mg by mouth daily. 10/22/22   [provider]  warfarin (COUMADIN) 5 MG tablet Take 0.5-1 tablets (2.5-5 mg total) by mouth See admin instructions. Take 5mg  by mouth daily except 2.5mg  by mouth on Sundays. 04/09/23   Glade Lloyd, MD      Allergies    Norco [hydrocodone-acetaminophen] and Hydrocodone    Review of Systems   Review of Systems  Constitutional:  Negative for chills and fever.  HENT:  Negative for sore throat.   Eyes:  Negative for redness.  Respiratory:  Negative for cough and shortness of breath.   Cardiovascular:  Negative for chest pain.  Gastrointestinal:  Positive for abdominal pain and nausea. Negative for  vomiting.  Genitourinary:  Negative for dysuria and flank pain.  Musculoskeletal:  Negative for back pain and neck pain.  Skin:  Negative for rash.  Neurological:  Negative for headaches.  Psychiatric/Behavioral:  Negative for confusion.     Physical Exam Updated Vital Signs BP (!) 157/104   Pulse (!) 131   Temp 98 F (36.7 C) (Oral)   Resp 11   Ht 1.803 m (5\' 11" )   Wt 80.7 kg   LMP 12/06/1976   SpO2 98%   BMI 24.83 kg/m  Physical Exam Vitals and nursing note reviewed.  Constitutional:      Appearance: Normal appearance. She is well-developed.  HENT:     Head: Atraumatic.     Nose: Nose normal.     Mouth/Throat:  Mouth: Mucous membranes are moist.  Eyes:     General: No scleral icterus.    Conjunctiva/sclera: Conjunctivae normal.  Neck:     Trachea: No tracheal deviation.  Cardiovascular:     Rate and Rhythm: Normal rate and regular rhythm.     Pulses: Normal pulses.     Heart sounds: Normal heart sounds. No murmur heard.    No friction rub. No gallop.  Pulmonary:     Effort: Pulmonary effort is normal. No respiratory distress.     Breath sounds: Normal breath sounds.  Abdominal:     General: Bowel sounds are normal. There is no distension.     Palpations: Abdomen is soft.     Tenderness: There is abdominal tenderness. There is no guarding or rebound.     Comments: Epigastric tenderness.   Genitourinary:    Comments: No cva tenderness.  Musculoskeletal:        General: No swelling or tenderness.     Cervical back: Normal range of motion and neck supple. No rigidity. No muscular tenderness.     Right lower leg: No edema.     Left lower leg: No edema.  Skin:    General: Skin is warm and dry.     Findings: No rash.  Neurological:     Mental Status: She is alert.     Comments: Alert, speech normal.   Psychiatric:        Mood and Affect: Mood normal.    ED Results / Procedures / Treatments   Labs (all labs ordered are listed, but only abnormal results  are displayed) Results for orders placed or performed during the hospital encounter of 04/26/23  Comprehensive metabolic panel  Result Value Ref Range   Sodium 135 135 - 145 mmol/L   Potassium 3.9 3.5 - 5.1 mmol/L   Chloride 100 98 - 111 mmol/L   CO2 22 22 - 32 mmol/L   Glucose, Bld 146 (H) 70 - 99 mg/dL   BUN 24 (H) 8 - 23 mg/dL   Creatinine, Ser 1.61 0.44 - 1.00 mg/dL   Calcium 8.8 (L) 8.9 - 10.3 mg/dL   Total Protein 7.4 6.5 - 8.1 g/dL   Albumin 3.8 3.5 - 5.0 g/dL   AST 20 15 - 41 U/L   ALT 18 0 - 44 U/L   Alkaline Phosphatase 87 38 - 126 U/L   Total Bilirubin 0.9 0.3 - 1.2 mg/dL   GFR, Estimated >09 >60 mL/min   Anion gap 13 5 - 15  CBC  Result Value Ref Range   WBC 5.2 4.0 - 10.5 K/uL   RBC 4.84 3.87 - 5.11 MIL/uL   Hemoglobin 14.6 12.0 - 15.0 g/dL   HCT 45.4 09.8 - 11.9 %   MCV 90.5 80.0 - 100.0 fL   MCH 30.2 26.0 - 34.0 pg   MCHC 33.3 30.0 - 36.0 g/dL   RDW 14.7 82.9 - 56.2 %   Platelets 135 (L) 150 - 400 K/uL   nRBC 0.0 0.0 - 0.2 %  Lipase, blood  Result Value Ref Range   Lipase 40 11 - 51 U/L  Protime-INR  Result Value Ref Range   Prothrombin Time 29.3 (H) 11.4 - 15.2 seconds   INR 2.8 (H) 0.8 - 1.2   CT ABDOMEN PELVIS W CONTRAST  Result Date: 04/24/2023 CLINICAL DATA:  Abdominal pain.  Metastatic pancreatic cancer. EXAM: CT ABDOMEN AND PELVIS WITH CONTRAST TECHNIQUE: Multidetector CT imaging of the abdomen and pelvis was performed using  the standard protocol following bolus administration of intravenous contrast. RADIATION DOSE REDUCTION: This exam was performed according to the departmental dose-optimization program which includes automated exposure control, adjustment of the mA and/or kV according to patient size and/or use of iterative reconstruction technique. CONTRAST:  OMNIPAQUE IOHEXOL 300 MG/ML  SOLN COMPARISON:  CT abdomen pelvis dated 04/03/2023. FINDINGS: Lower chest: Similar appearance of right lung base subpleural nodules measure up to 8 mm.  Mitral and aortic valve repairs. No intra-abdominal free air or free fluid. Hepatobiliary: Multiple hypoenhancing hepatic metastatic disease. These lesions appear more conspicuous or have increased since the prior CT. No biliary dilatation. The gallbladder is unremarkable. Pancreas: Hypoenhancing mass involving the body and tail of the pancreas as seen previously in keeping with known malignancy. Spleen: Normal in size without focal abnormality. Adrenals/Urinary Tract: The adrenal glands are unremarkable. There is no hydronephrosis on either side. There is symmetric enhancement and excretion of contrast by both kidneys. The visualized ureters and the bladder appear unremarkable. Stomach/Bowel: Postsurgical changes of bowel with anastomotic suture in the pelvis. There is no bowel obstruction or active inflammation. The appendix is not visualized with certainty. No inflammatory changes identified in the right lower quadrant. Vascular/Lymphatic: Mild aortoiliac atherosclerotic disease. There is tumor encasement of the celiac trunk. There is infiltration of the fat plane around the proximal SMA suggestive of encasement. The celiac artery, SMA, IMA are patent. The renal arteries are patent. There is in case mint of the proximal half of the splenic artery with mass effect and high-grade luminal narrowing. The splenic vein is not visualized and appears thrombosed. The SMV and main portal vein are patent. A 2.7 x 2.0 cm hypoenhancing soft tissue abutting the main portal vein (25/2) may represent an enlarged and metastatic adenopathy versus infiltrative tumor. No portal venous gas. Reproductive: Hysterectomy.  No adnexal masses. Other: Midline vertical anterior pelvic wall incisional scar. Musculoskeletal: Degenerative changes of the spine. No acute osseous pathology. IMPRESSION: 1. No acute intra-abdominal or pelvic pathology. 2. Hypoenhancing mass involving the body and tail of the pancreas in keeping with known  malignancy. 3. Multiple hypoenhancing hepatic metastatic disease. These lesions appear more conspicuous or have increased since the prior CT. 4. 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 6. Similar appearance of right lung base subpleural nodules measure up to 8 mm. 7.  Aortic Atherosclerosis (ICD10-I70.0). Electronically Signed   By: Elgie Collard M.D.   On: 04/24/2023 21:06   CT CHEST W CONTRAST  Result Date: 04/05/2023 CLINICAL DATA:  Lung nodule EXAM: CT CHEST WITH CONTRAST TECHNIQUE: Multidetector CT imaging of the chest was performed during intravenous contrast administration. RADIATION DOSE REDUCTION: This exam was performed according to the departmental dose-optimization program which includes automated exposure control, adjustment of the mA and/or kV according to patient size and/or use of iterative reconstruction technique. CONTRAST:  75mL OMNIPAQUE IOHEXOL 300 MG/ML  SOLN COMPARISON:  Chest CT dated May 25, 2022 FINDINGS: Cardiovascular: Normal heart size. No pericardial effusion. Normal caliber thoracic aorta with mild atherosclerotic disease. Prior tricuspid and mitral valve replacements. No suspicious filling defects of the pulmonary arteries. Mediastinum/Nodes: Esophagus and thyroid are unremarkable. No enlarged lymph nodes seen in the chest. Lungs/Pleura: Central airways are patent. No consolidation, pleural effusion or pneumothorax. Small solid pulmonary nodules. Reference solid pulmonary nodule of the left lower lobe measuring 9 x 6 mm. Additional reference solid pulmonary nodule of the right lower lobe measuring 4 mm on series  7, image 110. Upper Abdomen: Partially visualized pancreatic tail mass and hypoattenuating liver lesions, better evaluated on recently performed MRI of the abdomen and CT of the abdomen and pelvis. Musculoskeletal: Prior median sternotomy. No aggressive appearing osseous lesions. IMPRESSION: 1. Nonspecific  small solid pulmonary nodules, largest of the left lower lobe measures 9 x 6 mm. Recommend short-term follow-up chest CT in 3 months for further evaluation. 2. No evidence nodal metastatic disease in the chest. 3. Partially visualized pancreatic tail mass and hypoattenuating liver lesions, better evaluated on recently performed MRI of the abdomen and CT of the abdomen and pelvis. 4. Aortic Atherosclerosis (ICD10-I70.0). Electronically Signed   By: Allegra Lai M.D.   On: 04/05/2023 16:21   MR ABDOMEN W WO CONTRAST  Result Date: 04/04/2023 CLINICAL DATA:  Evaluate pancreas lesion. EXAM: MRI ABDOMEN WITHOUT AND WITH CONTRAST TECHNIQUE: Multiplanar multisequence MR imaging of the abdomen was performed both before and after the administration of intravenous contrast. CONTRAST:  8mL GADAVIST GADOBUTROL 1 MMOL/ML IV SOLN COMPARISON:  CT 04/03/2023 FINDINGS: Lower chest: No acute findings. Subpleural nodule within the right lower lobe measures 7 mm, image 11/17. Hepatobiliary: Multiple rim enhancing lesions are identified within the liver. The largest is in segment 4 B measuring 3.4 x 3.1, image 32/17. Lesion within segment 6 measures 1.4 by 1.0 cm, image 54/17. Gallbladder appears within normal limits. No gallstones or gallbladder wall edema. The common bile duct measures 6 mm in maximum diameter which is considered upper limits of normal. Pancreas: There is a large mass involving the body and tail of pancreas which is hypoenhancing measuring 6.4 by 2.2 cm, image 37/19. There is diffuse dilatation of the main duct and side branches of the main duct within the tail of pancreas compatible with main ductal obstruction. Encasement of the celiac artery and its branches are identified. At least partial encasement of the left side of the splenic artery is suspected, image 40/19. The portal vein, portal venous confluence and SMV do not appear to be involved. The splenic vein appears completely occluded. Spleen:  Within  normal limits in size and appearance. Adrenals/Urinary Tract: Normal adrenal glands. No signs of kidney mass or hydronephrosis. Stomach/Bowel: Stomach appears nondistended. No dilated bowel loops identified. Vascular/Lymphatic: Normal appearance of the abdominal aorta. Lymph node within the porta hepatic region measures 1.3 cm, image 30/17. Other: Trace fluid identified around the spleen. No focal fluid collections identified. Musculoskeletal: No suspicious bone lesions identified. IMPRESSION: 1. There is a large hypoenhancing mass involving the body and tail of pancreasa compatible with pancreatic adenocarcinoma. 2. 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 At least partial encasement of the left side of the splenic artery is suspected. The portal vein, portal venous confluence and SMV do not appear to be involved. The splenic vein appears completely occluded. 3. Multiple rim enhancing lesions are identified within the liver compatible with metastatic disease. 4. Lymph node within the porta hepatic region measures 1.3 cm. Suspicious for nodal metastasis. 5. Subpleural nodule within the right lower lobe measures 7 mm. Consider further evaluation with dedicated CT of the chest. Electronically Signed   By: Signa Kell M.D.   On: 04/04/2023 07:46   MR 3D Recon At Scanner  Result Date: 04/04/2023 CLINICAL DATA:  Evaluate pancreas lesion. EXAM: MRI ABDOMEN WITHOUT AND WITH CONTRAST TECHNIQUE: Multiplanar multisequence MR imaging of the abdomen was performed both before and after the administration of intravenous contrast. CONTRAST:  8mL GADAVIST GADOBUTROL 1 MMOL/ML IV SOLN  COMPARISON:  CT 04/03/2023 FINDINGS: Lower chest: No acute findings. Subpleural nodule within the right lower lobe measures 7 mm, image 11/17. Hepatobiliary: Multiple rim enhancing lesions are identified within the liver. The largest is in segment 4 B measuring 3.4 x 3.1, image 32/17. Lesion within segment 6  measures 1.4 by 1.0 cm, image 54/17. Gallbladder appears within normal limits. No gallstones or gallbladder wall edema. The common bile duct measures 6 mm in maximum diameter which is considered upper limits of normal. Pancreas: There is a large mass involving the body and tail of pancreas which is hypoenhancing measuring 6.4 by 2.2 cm, image 37/19. There is diffuse dilatation of the main duct and side branches of the main duct within the tail of pancreas compatible with main ductal obstruction. Encasement of the celiac artery and its branches are identified. At least partial encasement of the left side of the splenic artery is suspected, image 40/19. The portal vein, portal venous confluence and SMV do not appear to be involved. The splenic vein appears completely occluded. Spleen:  Within normal limits in size and appearance. Adrenals/Urinary Tract: Normal adrenal glands. No signs of kidney mass or hydronephrosis. Stomach/Bowel: Stomach appears nondistended. No dilated bowel loops identified. Vascular/Lymphatic: Normal appearance of the abdominal aorta. Lymph node within the porta hepatic region measures 1.3 cm, image 30/17. Other: Trace fluid identified around the spleen. No focal fluid collections identified. Musculoskeletal: No suspicious bone lesions identified. IMPRESSION: 1. There is a large hypoenhancing mass involving the body and tail of pancreasa compatible with pancreatic adenocarcinoma. 2. 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 At least partial encasement of the left side of the splenic artery is suspected. The portal vein, portal venous confluence and SMV do not appear to be involved. The splenic vein appears completely occluded. 3. Multiple rim enhancing lesions are identified within the liver compatible with metastatic disease. 4. Lymph node within the porta hepatic region measures 1.3 cm. Suspicious for nodal metastasis. 5. Subpleural nodule within the right lower  lobe measures 7 mm. Consider further evaluation with dedicated CT of the chest. Electronically Signed   By: Signa Kell M.D.   On: 04/04/2023 07:46   CT ABDOMEN PELVIS W CONTRAST  Addendum Date: 04/03/2023   ADDENDUM REPORT: 04/03/2023 22:33 ADDENDUM: Critical Value/emergent results were called by telephone at the time of interpretation on 04/03/2023 at 10:33 pm to provider Riki Sheer , who verbally acknowledged these results. Electronically Signed   By: Thornell Sartorius M.D.   On: 04/03/2023 22:33   Result Date: 04/03/2023 CLINICAL DATA:  Abdominal pain, acute, nonlocalized. EXAM: CT ABDOMEN AND PELVIS WITH CONTRAST TECHNIQUE: Multidetector CT imaging of the abdomen and pelvis was performed using the standard protocol following bolus administration of intravenous contrast. RADIATION DOSE REDUCTION: This exam was performed according to the departmental dose-optimization program which includes automated exposure control, adjustment of the mA and/or kV according to patient size and/or use of iterative reconstruction technique. CONTRAST:  75mL OMNIPAQUE IOHEXOL 300 MG/ML  SOLN COMPARISON:  12/18/2020. FINDINGS: Lower chest: A 6 mm nodule is present in the right lower lobe, axial image 9. A 1 cm nodule is noted in the right lower lobe, axial image 23. Hepatobiliary: A 1.2 cm hypodensity with surrounding enhancement is noted in the right lobe of the liver, segment 5. An ill-defined hypodensity is present in the left lobe of the liver measuring 2.3 cm, hepatic segment 4B. An ill-defined hypodensity is present in the left lobe of the liver  measuring 9 mm, hepatic segment 2. No biliary ductal dilatation. The gallbladder is without stones. Pancreas: An ill-defined hypodense mass is present in the distal pancreas measuring 9.9 x 2.9 cm. There is mild pancreatic ductal dilatation at the head of the pancreas measuring 4 mm. Mild fat stranding is noted about the tail of the pancreas. Spleen: Normal in size without focal  abnormality. Adrenals/Urinary Tract: No adrenal nodule or mass. The kidneys enhance symmetrically. No renal calculus or hydronephrosis. 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 Stomach/Bowel: Stomach is partially distended. Appendix appears normal. No evidence of bowel wall thickening, distention, or inflammatory changes. No bowel obstruction, free air, or pneumatosis. Vascular/Lymphatic: Aortic atherosclerosis. Prominent lymph nodes are present in the gastrohepatic ligament. The portal vein and superior mesenteric vein are patent. Splenic vein is not definitely seen on exam and may be occluded. Reproductive: Status post hysterectomy. No adnexal masses. Other: No abdominopelvic ascites. Musculoskeletal: Degenerative changes are present in the thoracolumbar spine. No acute or suspicious osseous abnormality. IMPRESSION: 1. Hypoenhancing masslike lesion in the tail of the pancreas measuring 9.9 x 2.9 cm. Mild surrounding fat stranding is noted. Differential diagnosis includes pancreatic adenocarcinoma versus pancreatitis. Correlation with amylase, lipase, and CA 19-9 is recommended. 2. The portal vein and superior mesenteric vein are patent. The splenic vein is not visualized on exam and may be occluded. 3. 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 4. Scattered ill-defined hypodensities in the liver new from the previous exam in the possibility of underlying metastatic disease can not be excluded. MRI with contrast is recommended for further evaluation. 5. Aortic atherosclerosis. 6. Stable right lower lobe pulmonary nodules measuring up to 1.0 cm. Consider one of the following in 3 months for both low-risk and high-risk individuals: (a) repeat chest CT, (b) follow-up PET-CT, or (c) tissue sampling. This recommendation follows the consensus statement: Guidelines for Management of Incidental  Pulmonary Nodules Detected on CT Images: From the Fleischner Society 2017; Radiology 2017; [id number]. 7. Remaining incidental findings as described above. Electronically Signed: By: Thornell Sartorius M.D. On: 04/03/2023 21:57    EKG None  Radiology No results found.  Procedures Procedures    Medications Ordered in ED Medications  HYDROmorphone (DILAUDID) injection 1 mg (has no administration in time range)  ondansetron (ZOFRAN) injection 4 mg (has no administration in time range)  HYDROmorphone (DILAUDID) injection 0.5 mg (0.5 mg Intravenous Given 04/26/23 1919)  ondansetron (ZOFRAN) injection 4 mg (4 mg Intravenous Given 04/26/23 1918)  sodium chloride 0.9 % bolus 1,000 mL (0 mLs Intravenous Stopped 04/26/23 2053)  HYDROmorphone (DILAUDID) injection 0.5 mg (0.5 mg Intravenous Given 04/26/23 2053)  morphine (PF) 4 MG/ML injection 4 mg (4 mg Intravenous Given 04/26/23 2136)  LORazepam (ATIVAN) tablet 1 mg (1 mg Oral Given 04/26/23 2138)  pantoprazole (PROTONIX) injection 40 mg (40 mg Intravenous Given 04/26/23 2136)  acetaminophen (TYLENOL) tablet 1,000 mg (1,000 mg Oral Given 04/26/23 2138)  famotidine (PEPCID) tablet 20 mg (20 mg Oral Given 04/26/23 2138)  alum & mag hydroxide-simeth (MAALOX/MYLANTA) 200-200-20 MG/5ML suspension 30 mL (30 mLs Oral Given 04/26/23 2136)    ED Course/ Medical Decision Making/ A&P                             Medical Decision Making Problems Addressed: Essential hypertension: chronic illness or injury with exacerbation, progression, or side effects of treatment  that poses a threat to life or bodily functions Pain of upper abdomen: acute illness or injury with systemic symptoms that poses a threat to life or bodily functions Pancreatic cancer metastasized to liver [facility]): acute illness or injury with systemic symptoms that poses a threat to life or bodily functions  Amount and/or Complexity of Data Reviewed External Data Reviewed: notes. Labs: ordered.  Decision-making details documented in ED Course. Radiology: independent interpretation performed. Decision-making details documented in ED Course.  Risk OTC drugs. Prescription drug management. Parenteral controlled substances. Decision regarding hospitalization.   Iv ns. Continuous pulse ox and cardiac monitoring. Labs ordered/sent.   Differential diagnosis includes pancreatic cancer, pancreatitis, pud, gallstones, etc. Dispo decision including potential need for admission considered - will get labs and reassess.   Reviewed nursing notes and prior charts for additional history. External reports reviewed.  Dilaudid iv. Zofran iv. NS bolus.   Cardiac monitor: sinus rhythm, rate 90.  Labs reviewed/interpreted by me - chem normal. Wbc and hgb normal. Lipase normal.   Pain improved but persists. Dilaudid iv.   Pain persists, morphine iv.   Recent CT and MR imaging reviewed/interpreted by me - +pancreatic mass, liver mets. Given ct imaging just two days ago, no peritoneal findings on exam, feel no incremental benefit for repeating today.   Patient  notes persistent epigastric pain and nausea. Additional dilaudid iv. Zofran iv.   Given persistent, not controlled pain, nausea, metastatic ca, will admit. Hospitalists consulted for admission.            Final Clinical Impression(s) / ED Diagnoses Final diagnoses:  None    Rx / DC Orders ED Discharge Orders     None         Cathren Laine, MD 04/26/23 2257

## 2023-04-26 NOTE — ED Notes (Signed)
ED TO INPATIENT HANDOFF REPORT  ED Nurse Name and Phone #: Sky Primo  S Name/Age/Gender [name] 62 y.o. female Room/Bed: WA17/WA17  Code Status   Code Status: Prior  Home/SNF/Other Home Patient oriented to: self, place, time, and situation Is this baseline? Yes   Triage Complete: Triage complete  Chief Complaint Intractable pain [R52]  Triage Note Pt coming in for abdominal pain that has been going on for apprx 2 wks. Pt took morphine with little relief. Supposed to follow up with oncologist, but has not seen them yet. Pt seen recently for same complaint.    Allergies Allergies  Allergen Reactions   Norco [Hydrocodone-Acetaminophen] Itching and Other (See Comments)    Tolerates Oxycodone   Hydrocodone Itching    Tolerates oxycodone     Level of Care/Admitting Diagnosis ED Disposition     ED Disposition  Admit   Condition  --   Comment  Hospital Area: [facility] [[id number]]  Level of Care: Telemetry [5]  Admit to tele based on following criteria: Other see comments  Comments: rate  May place patient in observation at [facility] or [facility] if equivalent level of care is available:: No  Covid Evaluation: Asymptomatic - no recent exposure (last 10 days) testing not required  Diagnosis: Intractable pain [708987]  Admitting Physician: Anselm Jungling [[id number]]  Attending Physician: Anselm Jungling [[id number]]          B Medical/Surgery History Past Medical History:  Diagnosis Date   Acute pericardial effusion 05/17/2022   AKI (acute kidney injury) (HCC) 05/17/2022   Anxiety    Arthritis    Cervicalgia    CHF (congestive heart failure) (HCC)    Chondromalacia of patella    Chronic neck pain    Chronic pain syndrome    Depression    secondary to loss of her son at age 7   Diabetic neuropathy (HCC)    DMII (diabetes mellitus, type 2) (HCC)    Dysrhythmia    Enthesopathy of hip region    Fibromyalgia    GERD (gastroesophageal  reflux disease)    Hep C w/o coma, chronic (HCC)    Hepatitis C    diagnosed 2005   Hypertension    Insomnia    Low back pain    Lumbago    Mitral regurgitation    Obesity    Pain in joint, upper arm    Primary localized osteoarthrosis, lower leg    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   Substance abuse (HCC)    sober since 2001   Tobacco abuse    Tricuspid regurgitation    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   Past Surgical History:  Procedure Laterality Date   ABDOMINAL HYSTERECTOMY     at age 67, unknown reasons   BIOPSY  04/07/2023   Procedure: BIOPSY;  Surgeon: Lemar Lofty., MD;  Location: [facility];  Service: Gastroenterology;;   Fidela Salisbury RELEASE  12/07/2011   left side   COLONOSCOPY WITH PROPOFOL N/A 07/30/2022   Procedure: COLONOSCOPY WITH PROPOFOL;  Surgeon: Meryl Dare, MD;  Location: [facility] ENDOSCOPY;  Service: Gastroenterology;  Laterality: N/A;   ESOPHAGOGASTRODUODENOSCOPY (EGD) WITH PROPOFOL N/A 04/07/2023   Procedure: ESOPHAGOGASTRODUODENOSCOPY (EGD) WITH PROPOFOL;  Surgeon: Meridee Score Netty Starring., MD;  Location: [facility] ENDOSCOPY;  Service: Gastroenterology;  Laterality: N/A;   EUS N/A 04/07/2023   Procedure: ESOPHAGEAL ENDOSCOPIC ULTRASOUND (EUS) RADIAL;  Surgeon: Meridee Score Netty Starring., MD;  Location: [facility] ENDOSCOPY;  Service:  Gastroenterology;  Laterality: N/A;   EYE SURGERY     FINE NEEDLE ASPIRATION N/A 04/07/2023   Procedure: FINE NEEDLE ASPIRATION (FNA) LINEAR;  Surgeon: Lemar Lofty., MD;  Location: [facility] ENDOSCOPY;  Service: Gastroenterology;  Laterality: N/A;   FOOT SURGERY Bilateral    HOT HEMOSTASIS N/A 07/30/2022   Procedure: HOT HEMOSTASIS (ARGON PLASMA COAGULATION/BICAP);  Surgeon: Meryl Dare, MD;  Location: [facility] ENDOSCOPY;  Service: Gastroenterology;  Laterality: N/A;   MITRAL VALVE REPAIR N/A 04/19/2022   Procedure: MITRAL VALVE REPAIR WITH PHYSIO II ANNULOPLASTY RING;  Surgeon: Loreli Slot,  MD;  Location: [facility] OR;  Service: Open Heart Surgery;  Laterality: N/A;   PERICARDIOCENTESIS N/A 05/10/2022   Procedure: PERICARDIOCENTESIS;  Surgeon: Corky Crafts, MD;  Location: [facility] INVASIVE CV LAB;  Service: Cardiovascular;  Laterality: N/A;   POLYPECTOMY  07/30/2022   Procedure: POLYPECTOMY;  Surgeon: Meryl Dare, MD;  Location: [facility];  Service: Gastroenterology;;   RIGHT/LEFT HEART CATH AND CORONARY ANGIOGRAPHY N/A 02/12/2022   Procedure: RIGHT/LEFT HEART CATH AND CORONARY ANGIOGRAPHY;  Surgeon: Lyn Records, MD;  Location: [facility] INVASIVE CV LAB;  Service: Cardiovascular;  Laterality: N/A;   TEE WITHOUT CARDIOVERSION N/A 02/03/2022   Procedure: TRANSESOPHAGEAL ECHOCARDIOGRAM (TEE);  Surgeon: Wendall Stade, MD;  Location: [facility] ENDOSCOPY;  Service: Cardiovascular;  Laterality: N/A;   TEE WITHOUT CARDIOVERSION N/A 04/19/2022   Procedure: TRANSESOPHAGEAL ECHOCARDIOGRAM (TEE);  Surgeon: Loreli Slot, MD;  Location: [facility] OR;  Service: Open Heart Surgery;  Laterality: N/A;   TEE WITHOUT CARDIOVERSION N/A 07/28/2022   Procedure: TRANSESOPHAGEAL ECHOCARDIOGRAM (TEE);  Surgeon: Thurmon Fair, MD;  Location: [facility] ENDOSCOPY;  Service: Cardiovascular;  Laterality: N/A;   TRICUSPID VALVE REPLACEMENT N/A 04/19/2022   Procedure: TRICUSPID VALVE REPAIR WITH MC3 ANNULOPLASTY RING;  Surgeon: Loreli Slot, MD;  Location: [facility] OR;  Service: Open Heart Surgery;  Laterality: N/A;     A IV Location/Drains/Wounds Patient Lines/Drains/Airways Status     Active Line/Drains/Airways     Name Placement date Placement time Site Days   Peripheral IV 04/26/23 20 G 1" Right Wrist 04/26/23  1915  Wrist  less than 1            Intake/Output Last 24 hours  Intake/Output Summary (Last 24 hours) at 04/26/2023 2328 Last data filed at 04/26/2023 2053 Gross per 24 hour  Intake 1000 ml  Output --  Net 1000 ml    Labs/Imaging Results for orders placed or performed during the hospital  encounter of 04/26/23 (from the past 48 hour(s))  Comprehensive metabolic panel     Status: Abnormal   Collection Time: 04/26/23  7:14 PM  Result Value Ref Range   Sodium 135 135 - 145 mmol/L   Potassium 3.9 3.5 - 5.1 mmol/L   Chloride 100 98 - 111 mmol/L   CO2 22 22 - 32 mmol/L   Glucose, Bld 146 (H) 70 - 99 mg/dL    Comment: Glucose reference range applies only to samples taken after fasting for at least 8 hours.   BUN 24 (H) 8 - 23 mg/dL   Creatinine, Ser 1.61 0.44 - 1.00 mg/dL   Calcium 8.8 (L) 8.9 - 10.3 mg/dL   Total Protein 7.4 6.5 - 8.1 g/dL   Albumin 3.8 3.5 - 5.0 g/dL   AST 20 15 - 41 U/L   ALT 18 0 - 44 U/L   Alkaline Phosphatase 87 38 - 126 U/L   Total Bilirubin 0.9 0.3 - 1.2 mg/dL  GFR, Estimated >60 >60 mL/min    Comment: (NOTE) Calculated using the CKD-EPI Creatinine Equation (2021)    Anion gap 13 5 - 15    Comment: Performed at [facility], [address], [place], [place] [address]  CBC     Status: Abnormal   Collection Time: 04/26/23  7:14 PM  Result Value Ref Range   WBC 5.2 4.0 - 10.5 K/uL   RBC 4.84 3.87 - 5.11 MIL/uL   Hemoglobin 14.6 12.0 - 15.0 g/dL   HCT 60.4 54.0 - 98.1 %   MCV 90.5 80.0 - 100.0 fL   MCH 30.2 26.0 - 34.0 pg   MCHC 33.3 30.0 - 36.0 g/dL   RDW 19.1 47.8 - 29.5 %   Platelets 135 (L) 150 - 400 K/uL    Comment: SPECIMEN CHECKED FOR CLOTS REPEATED TO VERIFY    nRBC 0.0 0.0 - 0.2 %    Comment: Performed at [facility], [address], [place], [place] [address]  Lipase, blood     Status: None   Collection Time: 04/26/23  7:14 PM  Result Value Ref Range   Lipase 40 11 - 51 U/L    Comment: Performed at [facility], [address] [place], [place] [address]  Protime-INR     Status: Abnormal   Collection Time: 04/26/23  7:14 PM  Result Value Ref Range   Prothrombin Time 29.3 (H) 11.4 - 15.2 seconds   INR 2.8 (H) 0.8 - 1.2    Comment: (NOTE) INR goal varies based on  device and disease states. Performed at [facility], [address], [place], [place] [address]    No results found.  Pending Labs Unresulted Labs (From admission, onward)    None       Vitals/Pain Today's Vitals   04/26/23 2220 04/26/23 2220 04/26/23 2300 04/26/23 2315  BP:   (!) 144/111 (!) 156/102  Pulse:   76 75  Resp:   11 10  Temp:  98 F (36.7 C)    TempSrc:  Oral    SpO2:   96% 96%  Weight:      Height:      PainSc: 8        Isolation Precautions No active isolations  Medications Medications  HYDROmorphone (DILAUDID) injection 0.5 mg (0.5 mg Intravenous Given 04/26/23 1919)  ondansetron (ZOFRAN) injection 4 mg (4 mg Intravenous Given 04/26/23 1918)  sodium chloride 0.9 % bolus 1,000 mL (0 mLs Intravenous Stopped 04/26/23 2053)  HYDROmorphone (DILAUDID) injection 0.5 mg (0.5 mg Intravenous Given 04/26/23 2053)  morphine (PF) 4 MG/ML injection 4 mg (4 mg Intravenous Given 04/26/23 2136)  LORazepam (ATIVAN) tablet 1 mg (1 mg Oral Given 04/26/23 2138)  pantoprazole (PROTONIX) injection 40 mg (40 mg Intravenous Given 04/26/23 2136)  acetaminophen (TYLENOL) tablet 1,000 mg (1,000 mg Oral Given 04/26/23 2138)  famotidine (PEPCID) tablet 20 mg (20 mg Oral Given 04/26/23 2138)  alum & mag hydroxide-simeth (MAALOX/MYLANTA) 200-200-20 MG/5ML suspension 30 mL (30 mLs Oral Given 04/26/23 2136)  HYDROmorphone (DILAUDID) injection 1 mg (1 mg Intravenous Given 04/26/23 2300)  ondansetron (ZOFRAN) injection 4 mg (4 mg Intravenous Given 04/26/23 2258)    Mobility walks with person assist      R Recommendations: See Admitting Provider Note  Report given to:   Additional Notes:

## 2023-04-26 NOTE — Telephone Encounter (Signed)
Patient is following up regarding her request for pain medication. I informed her that there may be a delay in response time as Dr. Izora Ribas is not scheduled in the office today. Patient would like a call back from RN to determine if anyone is able to assist during the time being.

## 2023-04-26 NOTE — Telephone Encounter (Signed)
Called pt to notify that Oncology will be sending in pain medication.  Pt sister Alvira Philips took over the conversation.  Reports pt is getting confused with all the different doctors and she will be helping out.  Reports pt is not eating one day she only drank 1/2 an Ensure all day.  Pt is very weak and she is concerned pt will go downhill if she doesn't eat. Advised sister of pt reported high BP 170-180's.  Advised that pt should take BP meds if able.  MD will prescribe hydralazine 25 mg PO QD PRN SBP>150.  Advised pt sister once pt starts pain medications BP will more than likely drop and pt may not need hydralazine.  Advised to call our office with questions and concerns.

## 2023-04-27 ENCOUNTER — Encounter (HOSPITAL_COMMUNITY): Payer: Self-pay | Admitting: Family Medicine

## 2023-04-27 DIAGNOSIS — I10 Essential (primary) hypertension: Secondary | ICD-10-CM | POA: Insufficient documentation

## 2023-04-27 DIAGNOSIS — R52 Pain, unspecified: Secondary | ICD-10-CM | POA: Diagnosis not present

## 2023-04-27 DIAGNOSIS — D696 Thrombocytopenia, unspecified: Secondary | ICD-10-CM

## 2023-04-27 LAB — URINALYSIS, ROUTINE W REFLEX MICROSCOPIC
Bilirubin Urine: NEGATIVE
Glucose, UA: >=500 mg/dL — AB
Hgb urine dipstick: NEGATIVE
Ketones, ur: 5 mg/dL — AB
Nitrite: NEGATIVE
Protein, ur: 30 mg/dL — AB
Specific Gravity, Urine: 1.03 (ref 1.005–1.030)
pH: 5 (ref 5.0–8.0)

## 2023-04-27 LAB — BASIC METABOLIC PANEL
Anion gap: 10 (ref 5–15)
BUN: 24 mg/dL — ABNORMAL HIGH (ref 8–23)
CO2: 24 mmol/L (ref 22–32)
Calcium: 8.7 mg/dL — ABNORMAL LOW (ref 8.9–10.3)
Chloride: 101 mmol/L (ref 98–111)
Creatinine, Ser: 0.96 mg/dL (ref 0.44–1.00)
GFR, Estimated: >60 mL/min (ref >60)
Glucose, Bld: 129 mg/dL — ABNORMAL HIGH (ref 70–99)
Potassium: 4.1 mmol/L (ref 3.5–5.1)
Sodium: 135 mmol/L (ref 135–145)

## 2023-04-27 LAB — GLUCOSE, CAPILLARY
Glucose-Capillary: 122 mg/dL — ABNORMAL HIGH (ref 70–99)
Glucose-Capillary: 129 mg/dL — ABNORMAL HIGH (ref 70–99)
Glucose-Capillary: 132 mg/dL — ABNORMAL HIGH (ref 70–99)
Glucose-Capillary: 151 mg/dL — ABNORMAL HIGH (ref 70–99)
Glucose-Capillary: 85 mg/dL (ref 70–99)

## 2023-04-27 LAB — PROTIME-INR
INR: 2.9 — ABNORMAL HIGH (ref 0.8–1.2)
Prothrombin Time: 30.2 seconds — ABNORMAL HIGH (ref 11.4–15.2)

## 2023-04-27 MED ORDER — METOPROLOL TARTRATE 25 MG PO TABS
25.0000 mg | ORAL_TABLET | Freq: Two times a day (BID) | ORAL | Status: DC
  Administered 2023-04-27 – 2023-05-06 (×20): 25 mg via ORAL
  Filled 2023-04-27 (×20): qty 1

## 2023-04-27 MED ORDER — WARFARIN - PHARMACIST DOSING INPATIENT: Freq: Every day | Status: DC

## 2023-04-27 MED ORDER — HYDROMORPHONE HCL 1 MG/ML IJ SOLN
0.5000 mg | INTRAMUSCULAR | Status: DC | PRN
  Filled 2023-04-27: qty 0.5

## 2023-04-27 MED ORDER — ACETAMINOPHEN 325 MG PO TABS: 650.0000 mg | ORAL_TABLET | Freq: Four times a day (QID) | ORAL | Status: DC | PRN

## 2023-04-27 MED ORDER — HYDRALAZINE HCL 25 MG PO TABS
25.0000 mg | ORAL_TABLET | Freq: Three times a day (TID) | ORAL | Status: DC
  Administered 2023-04-27 – 2023-05-06 (×27): 25 mg via ORAL
  Filled 2023-04-27 (×27): qty 1

## 2023-04-27 MED ORDER — ERYTHROMYCIN 5 MG/GM OP OINT
TOPICAL_OINTMENT | Freq: Three times a day (TID) | OPHTHALMIC | Status: DC
  Filled 2023-04-27: qty 1

## 2023-04-27 MED ORDER — PANTOPRAZOLE SODIUM 40 MG PO TBEC
40.0000 mg | DELAYED_RELEASE_TABLET | Freq: Every day | ORAL | Status: DC
  Administered 2023-04-27 – 2023-05-06 (×10): 40 mg via ORAL
  Filled 2023-04-27 (×10): qty 1

## 2023-04-27 MED ORDER — ONDANSETRON HCL 4 MG/2ML IJ SOLN: 4.0000 mg | Freq: Four times a day (QID) | INTRAMUSCULAR | Status: DC | PRN

## 2023-04-27 MED ORDER — NICOTINE 21 MG/24HR TD PT24
21.0000 mg | MEDICATED_PATCH | Freq: Every day | TRANSDERMAL | Status: DC
  Administered 2023-04-27 – 2023-05-06 (×10): 21 mg via TRANSDERMAL
  Filled 2023-04-27 (×10): qty 1

## 2023-04-27 MED ORDER — INSULIN ASPART 100 UNIT/ML IJ SOLN
0.0000 [IU] | Freq: Three times a day (TID) | INTRAMUSCULAR | Status: DC
  Administered 2023-04-27 (×2): 1 [IU] via SUBCUTANEOUS
  Administered 2023-04-28: 2 [IU] via SUBCUTANEOUS
  Administered 2023-04-28: 3 [IU] via SUBCUTANEOUS
  Administered 2023-04-29: 5 [IU] via SUBCUTANEOUS
  Administered 2023-04-29 (×2): 2 [IU] via SUBCUTANEOUS
  Administered 2023-04-30: 7 [IU] via SUBCUTANEOUS
  Administered 2023-04-30 (×2): 2 [IU] via SUBCUTANEOUS
  Administered 2023-05-01: 5 [IU] via SUBCUTANEOUS
  Administered 2023-05-01: 7 [IU] via SUBCUTANEOUS
  Administered 2023-05-01: 2 [IU] via SUBCUTANEOUS
  Administered 2023-05-02: 5 [IU] via SUBCUTANEOUS
  Administered 2023-05-02: 7 [IU] via SUBCUTANEOUS
  Administered 2023-05-02 – 2023-05-03 (×2): 2 [IU] via SUBCUTANEOUS
  Administered 2023-05-03 – 2023-05-04 (×3): 3 [IU] via SUBCUTANEOUS
  Administered 2023-05-04: 5 [IU] via SUBCUTANEOUS
  Administered 2023-05-05: 7 [IU] via SUBCUTANEOUS
  Administered 2023-05-05: 2 [IU] via SUBCUTANEOUS
  Administered 2023-05-06: 5 [IU] via SUBCUTANEOUS
  Administered 2023-05-06: 1 [IU] via SUBCUTANEOUS

## 2023-04-27 MED ORDER — FUROSEMIDE 40 MG PO TABS
40.0000 mg | ORAL_TABLET | Freq: Every day | ORAL | Status: DC
  Administered 2023-04-27 – 2023-05-06 (×10): 40 mg via ORAL
  Filled 2023-04-27 (×10): qty 1

## 2023-04-27 MED ORDER — OXYCODONE HCL 5 MG PO TABS
15.0000 mg | ORAL_TABLET | Freq: Four times a day (QID) | ORAL | Status: DC | PRN
  Administered 2023-04-27 – 2023-04-28 (×6): 15 mg via ORAL
  Filled 2023-04-27 (×6): qty 3

## 2023-04-27 MED ORDER — ERYTHROMYCIN 5 MG/GM OP OINT
TOPICAL_OINTMENT | Freq: Three times a day (TID) | OPHTHALMIC | Status: AC
  Filled 2023-04-27: qty 3.5

## 2023-04-27 MED ORDER — GABAPENTIN 300 MG PO CAPS
300.0000 mg | ORAL_CAPSULE | Freq: Three times a day (TID) | ORAL | Status: DC
  Administered 2023-04-27 – 2023-05-06 (×28): 300 mg via ORAL
  Filled 2023-04-27 (×28): qty 1

## 2023-04-27 MED ORDER — ARIPIPRAZOLE 5 MG PO TABS
2.5000 mg | ORAL_TABLET | Freq: Every day | ORAL | Status: DC
  Administered 2023-04-27 – 2023-05-06 (×10): 2.5 mg via ORAL
  Filled 2023-04-27 (×10): qty 1

## 2023-04-27 MED ORDER — FLUCONAZOLE 100 MG PO TABS
100.0000 mg | ORAL_TABLET | Freq: Every day | ORAL | Status: AC
  Administered 2023-04-27 – 2023-04-28 (×2): 100 mg via ORAL
  Filled 2023-04-27 (×2): qty 1

## 2023-04-27 MED ORDER — MORPHINE SULFATE ER 15 MG PO TBCR
30.0000 mg | EXTENDED_RELEASE_TABLET | Freq: Two times a day (BID) | ORAL | Status: DC
  Administered 2023-04-27 – 2023-04-29 (×6): 30 mg via ORAL
  Filled 2023-04-27 (×6): qty 2

## 2023-04-27 MED ORDER — VORTIOXETINE HBR 5 MG PO TABS
10.0000 mg | ORAL_TABLET | Freq: Every day | ORAL | Status: DC
  Administered 2023-04-27 – 2023-05-06 (×10): 10 mg via ORAL
  Filled 2023-04-27 (×10): qty 2

## 2023-04-27 MED ORDER — BUPROPION HCL ER (XL) 300 MG PO TB24
300.0000 mg | ORAL_TABLET | Freq: Every morning | ORAL | Status: DC
  Administered 2023-04-27 – 2023-05-06 (×10): 300 mg via ORAL
  Filled 2023-04-27 (×10): qty 1

## 2023-04-27 MED ORDER — ORAL CARE MOUTH RINSE: 15.0000 mL | OROMUCOSAL | Status: DC | PRN

## 2023-04-27 MED ORDER — HYDRALAZINE HCL 20 MG/ML IJ SOLN: 10.0000 mg | Freq: Four times a day (QID) | INTRAMUSCULAR | Status: DC | PRN

## 2023-04-27 MED ORDER — ATORVASTATIN CALCIUM 20 MG PO TABS
20.0000 mg | ORAL_TABLET | Freq: Every day | ORAL | Status: DC
  Administered 2023-04-27 – 2023-05-06 (×10): 20 mg via ORAL
  Filled 2023-04-27 (×10): qty 1

## 2023-04-27 NOTE — Consult Note (Signed)
Chief Complaint: Patient was seen in consultation today for pancreatic mass, liver lesion Chief Complaint  Patient presents with   Abdominal Pain   at the request of Malachy Mood   Referring Physician(s): Malachy Mood   Supervising Physician: Richarda Overlie  Patient Status: [facility]  History of Present Illness: [name] is a 62 y.o. female with PMHs of rheumatic heart disease s/p MV and TV repair 04/2022, PAF on Coumadin, CKD stage IIIa, COPD, T2DM, HTN, hepatitis C, recent finding of pancreatic mass with liver and nodal metastases who was referred to IR for liver lesion biopsy.   Patient came to ED on 04/03/23 due to abdominal pain, CT AP with showed pancreatic tail mass and  liver lesions. MR abdomen on 4/29 showed large hypoenhancing mass involving the body and tail of pancreasa compatible with pancreatic adenocarcinoma, multiple rim enhancing lesions are identified within the liver compatible with metastatic disease, and  lymph node within the porta hepatic region suspicious for nodal metastasis. She underwent EUS on 04/07/23 with pancreatic body/tail mass biopsied but pathology unfortunately was non-diagnostic.   Patient was discharged home on 04/09/23, IR was requested for image guided liver lesion bx and PAC placement from oncology. Image was reviewed and approved for US guided liver lesion biopsy by Dr. Grace Isaac, she was scheduled for outpatient liver lesion bx and PAC placement on 05/03/23.  Patient was cleared to hold warfarin for 5 days by cardiology.   Unfortunately, patient presented to ED on 5/19 due to abdominal pain, CT AP with did not show acute intra-abdominal or pelvic pathology, she was discharged home from ED but she had to come back to ED on 5/21 due to epigastric pain. In ED she was hemodynamically stable and had no signs or symptoms of acute infection, patient was admitted for pain control.   IR was requested for image guided liver lesion biopsy to be performed while she  is admitted by oncology team.  Currently INR too elevated for high bleeding risk procedures such as liver lesion biopsy, Dr. Mosetta Putt was informed that liver biopsy will be performed when INR is with in the safe range. Asked Dr. Mosetta Putt if she would like IR to proceed with PAC placement at the same time of the bx, no response received. Asked Dr. Mosetta Putt to place port placement order if it is needed.  Patient laying in bed, not in acute distress.  Reports mild SOB due to pain, generalized abdominal pain that radiated to her upper back. Little bit of nausea today as well.  Denise headache, fever, chills, cough, chest pain, vomiting, and bleeding.  Patient was informed that IR can only proceed when INR is in the safe range for liver lesion bx, unclear if it will be in the range tomorrow or Friday.  If INR iis still to high on Firday, bx will most likely be performed on Tuesday 1:00 as she was already scheduled as outpatient.  She was informed that she will be NPO at [facility] for Thursday and possibly Friday.  She verbalized understanding and stated that she has not eat much lately due to low appetite.   Past Medical History:  Diagnosis Date   Acute pericardial effusion 05/17/2022   AKI (acute kidney injury) (HCC) 05/17/2022   Anxiety    Arthritis    Cervicalgia    CHF (congestive heart failure) (HCC)    Chondromalacia of patella    Chronic neck pain    Chronic pain syndrome    Depression  secondary to loss of her son at age 34   Diabetic neuropathy (HCC)    DMII (diabetes mellitus, type 2) (HCC)    Dysrhythmia    Enthesopathy of hip region    Fibromyalgia    GERD (gastroesophageal reflux disease)    Hep C w/o coma, chronic (HCC)    Hepatitis C    diagnosed 2005   Hypertension    Insomnia    Low back pain    Lumbago    Mitral regurgitation    Obesity    Pain in joint, upper arm    Primary localized osteoarthrosis, lower leg    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   Substance  abuse (HCC)    sober since 2001   Tobacco abuse    Tricuspid regurgitation    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    Past Surgical History:  Procedure Laterality Date   ABDOMINAL HYSTERECTOMY     at age 53, unknown reasons   BIOPSY  04/07/2023   Procedure: BIOPSY;  Surgeon: Lemar Lofty., MD;  Location: [facility];  Service: Gastroenterology;;   Fidela Salisbury RELEASE  12/07/2011   left side   COLONOSCOPY WITH PROPOFOL N/A 07/30/2022   Procedure: COLONOSCOPY WITH PROPOFOL;  Surgeon: Meryl Dare, MD;  Location: [facility] ENDOSCOPY;  Service: Gastroenterology;  Laterality: N/A;   ESOPHAGOGASTRODUODENOSCOPY (EGD) WITH PROPOFOL N/A 04/07/2023   Procedure: ESOPHAGOGASTRODUODENOSCOPY (EGD) WITH PROPOFOL;  Surgeon: Meridee Score Netty Starring., MD;  Location: [facility] ENDOSCOPY;  Service: Gastroenterology;  Laterality: N/A;   EUS N/A 04/07/2023   Procedure: ESOPHAGEAL ENDOSCOPIC ULTRASOUND (EUS) RADIAL;  Surgeon: Meridee Score Netty Starring., MD;  Location: [facility] ENDOSCOPY;  Service: Gastroenterology;  Laterality: N/A;   EYE SURGERY     FINE NEEDLE ASPIRATION N/A 04/07/2023   Procedure: FINE NEEDLE ASPIRATION (FNA) LINEAR;  Surgeon: Lemar Lofty., MD;  Location: [facility] ENDOSCOPY;  Service: Gastroenterology;  Laterality: N/A;   FOOT SURGERY Bilateral    HOT HEMOSTASIS N/A 07/30/2022   Procedure: HOT HEMOSTASIS (ARGON PLASMA COAGULATION/BICAP);  Surgeon: Meryl Dare, MD;  Location: [facility] ENDOSCOPY;  Service: Gastroenterology;  Laterality: N/A;   MITRAL VALVE REPAIR N/A 04/19/2022   Procedure: MITRAL VALVE REPAIR WITH PHYSIO II ANNULOPLASTY RING;  Surgeon: Loreli Slot, MD;  Location: [facility] OR;  Service: Open Heart Surgery;  Laterality: N/A;   PERICARDIOCENTESIS N/A 05/10/2022   Procedure: PERICARDIOCENTESIS;  Surgeon: Corky Crafts, MD;  Location: [facility] INVASIVE CV LAB;  Service: Cardiovascular;  Laterality: N/A;   POLYPECTOMY  07/30/2022   Procedure: POLYPECTOMY;  Surgeon: Meryl Dare, MD;  Location: [facility] ENDOSCOPY;  Service: Gastroenterology;;   RIGHT/LEFT HEART CATH AND CORONARY ANGIOGRAPHY N/A 02/12/2022   Procedure: RIGHT/LEFT HEART CATH AND CORONARY ANGIOGRAPHY;  Surgeon: Lyn Records, MD;  Location: [facility] INVASIVE CV LAB;  Service: Cardiovascular;  Laterality: N/A;   TEE WITHOUT CARDIOVERSION N/A 02/03/2022   Procedure: TRANSESOPHAGEAL ECHOCARDIOGRAM (TEE);  Surgeon: Wendall Stade, MD;  Location: [facility] ENDOSCOPY;  Service: Cardiovascular;  Laterality: N/A;   TEE WITHOUT CARDIOVERSION N/A 04/19/2022   Procedure: TRANSESOPHAGEAL ECHOCARDIOGRAM (TEE);  Surgeon: Loreli Slot, MD;  Location: [facility] OR;  Service: Open Heart Surgery;  Laterality: N/A;   TEE WITHOUT CARDIOVERSION N/A 07/28/2022   Procedure: TRANSESOPHAGEAL ECHOCARDIOGRAM (TEE);  Surgeon: Thurmon Fair, MD;  Location: [facility] ENDOSCOPY;  Service: Cardiovascular;  Laterality: N/A;   TRICUSPID VALVE REPLACEMENT N/A 04/19/2022   Procedure: TRICUSPID VALVE REPAIR WITH MC3 ANNULOPLASTY RING;  Surgeon: Loreli Slot, MD;  Location: [facility] OR;  Service: Open Heart Surgery;  Laterality: N/A;    Allergies: Norco [hydrocodone-acetaminophen] and Hydrocodone  Medications: Prior to Admission medications   Medication Sig Start Date End Date Taking? Authorizing Provider  ARIPiprazole (ABILIFY) 5 MG tablet Take 2.5 mg by mouth daily. 07/20/22  Yes [provider]  atorvastatin (LIPITOR) 20 MG tablet Take 20 mg by mouth daily.   Yes [provider]  buPROPion (WELLBUTRIN XL) 300 MG 24 hr tablet Take 300 mg by mouth in the morning. 03/24/22  Yes [provider]  dexlansoprazole (DEXILANT) 60 MG capsule Take 60 mg by mouth daily. 07/26/22  Yes [provider]  diclofenac Sodium (VOLTAREN) 1 % GEL Apply 2 g topically 4 (four) times daily as needed (pain). 06/29/17  Yes [provider]  famotidine (PEPCID) 20 MG tablet Take 20 mg by mouth daily.   Yes [provider]   FARXIGA 10 MG TABS tablet Take 10 mg by mouth daily. 08/10/22  Yes [provider]  fluconazole (DIFLUCAN) 100 MG tablet Take 2 tablets (200 mg total) by mouth daily for 1 day, THEN 1 tablet (100 mg total) daily for 13 days. 04/15/23 04/29/23 Yes Mansouraty, Netty Starring., MD  FLUoxetine (PROZAC) 20 MG capsule Take 20 mg by mouth daily. 09/20/22  Yes [provider]  furosemide (LASIX) 40 MG tablet Take 1 tablet (40 mg total) by mouth daily. 02/28/23  Yes Chandrasekhar, Mahesh A, MD  gabapentin (NEURONTIN) 300 MG capsule Take 300 mg by mouth 3 (three) times daily. 08/19/22  Yes [provider]  hydrALAZINE (APRESOLINE) 25 MG tablet Take 1 tablet (25 mg total) by mouth daily as needed (BP>150). 04/26/23  Yes Chandrasekhar, Mahesh A, MD  insulin glargine (LANTUS) 100 UNIT/ML Solostar Pen Inject 15 Units into the skin daily. 04/09/23  Yes Glade Lloyd, MD  linagliptin (TRADJENTA) 5 MG TABS tablet Take 5 mg by mouth daily.   Yes [provider]  metoprolol tartrate (LOPRESSOR) 25 MG tablet Take 12.5 mg by mouth 2 (two) times daily.   Yes [provider]  metoprolol tartrate (LOPRESSOR) 50 MG tablet Take 50 mg by mouth 2 (two) times daily.   Yes [provider]  morphine (MS CONTIN) 30 MG 12 hr tablet Take 1 tablet (30 mg total) by mouth every 12 (twelve) hours. 04/25/23  Yes Pollyann Samples, NP  omeprazole (PRILOSEC) 10 MG capsule Take 10 mg by mouth daily.   Yes [provider]  oxyCODONE (ROXICODONE) 15 MG immediate release tablet Take 1 tablet (15 mg total) by mouth every 6 (six) hours as needed for pain. 04/25/23  Yes Pollyann Samples, NP  PREMARIN 0.45 MG tablet Take 0.45 mg by mouth daily. 01/11/22  Yes [provider]  QUEtiapine (SEROQUEL) 25 MG tablet Take 25 mg by mouth as needed. 03/17/23  Yes [provider]  TIADYLT ER 300 MG 24 hr capsule Take 300 mg by mouth daily. 03/11/23  Yes [provider]   tobramycin-dexamethasone (TOBRADEX) ophthalmic solution 1 drop 4 (four) times daily. 04/14/23  Yes [provider]  traZODone (DESYREL) 100 MG tablet Take 200 mg by mouth at bedtime. Pt takes 2 tablets 200 mg at bedtime.   Yes [provider]  TRINTELLIX 10 MG TABS tablet Take 10 mg by mouth daily. 10/22/22  Yes [provider]  warfarin (COUMADIN) 5 MG tablet Take 0.5-1 tablets (2.5-5 mg total) by mouth See admin instructions. Take 5mg  by mouth daily except 2.5mg  by  mouth on Sundays. 04/09/23  Yes Glade Lloyd, MD  Blood Glucose Monitoring Suppl DEVI 1 each by Does not apply route in the morning, at noon, and at bedtime. May substitute to any manufacturer covered by patient's insurance. Patient not taking: Reported on 04/27/2023 04/09/23   Glade Lloyd, MD  Glucose Blood (BLOOD GLUCOSE TEST STRIPS) STRP 1 each by In Vitro route in the morning, at noon, and at bedtime. May substitute to any manufacturer covered by patient's insurance. Patient not taking: Reported on 04/27/2023 04/09/23   Glade Lloyd, MD  Insulin Pen Needle 31G X 5 MM MISC Lantus 15 units daily Patient not taking: Reported on 04/27/2023 04/09/23   Glade Lloyd, MD  Lancet Device MISC 1 each by Does not apply route in the morning, at noon, and at bedtime. May substitute to any manufacturer covered by patient's insurance. Patient not taking: Reported on 04/27/2023 04/09/23 05/09/23  Glade Lloyd, MD  Lancets Misc. MISC 1 each by Does not apply route in the morning, at noon, and at bedtime. May substitute to any manufacturer covered by patient's insurance. Patient not taking: Reported on 04/27/2023 04/09/23 05/09/23  Glade Lloyd, MD  naloxone [facility]) nasal spray 4 mg/0.1 mL PRN for suspected opiate OD Patient not taking: Reported on 04/27/2023 04/24/23   Glyn Ade, MD  Ethelda Chick (OYSTER CALCIUM) 500 MG TABS tablet Take 500 mg of elemental calcium by mouth daily. Patient not taking: Reported on 04/27/2023     [provider]     Family History  Problem Relation Age of Onset   Uterine cancer Mother    Alcohol abuse Father    Alcohol abuse Sister    Drug abuse Sister    Drug abuse Brother    Alcohol abuse Brother    Schizophrenia Son    Diabetes Other    Arthritis Other    Hypertension Other    Heart attack Son 17       Died suddenly    Social History   Socioeconomic History   Marital status: Single    Spouse name: Not on file   Number of children: 2   Years of education: Not on file   Highest education level: Not on file  Occupational History   Occupation: CNA    Comment: worked for 15 years   Occupation: disability  Tobacco Use   Smoking status: Every Day    Packs/day: 0.25    Years: 35.00    Additional pack years: 0.00    Total pack years: 8.75    Types: Cigarettes   Smokeless tobacco: Never   Tobacco comments:    4 cigarettes a day  Vaping Use   Vaping Use: Some days  Substance and Sexual Activity   Alcohol use: No   Drug use: No   Sexual activity: Not Currently    Comment: Celebate since 2000  Other Topics Concern   Not on file  Social History Narrative   Born and raised by mom until mom died when she was 14yo. After that she stayed with her aunt. Dad was married to someone else and was never around.  Pt has 1 brother and 1 sister and pt is the middle child. Pt had a very rough childhood. Never married. 2 kids one son died several  yrs ago and other son has Schizophrenia. Pt has graduated HS and has some college. Pt is on disability and is unemployed. She used to work as a Lawyer until 2000.  Financial assistance approved for 100% discount at [facility] and has [facility] card   Rudell Cobb  September 29, 2010 2:27 PM   Social Determinants of Health   Financial Resource Strain: Not on file  Food Insecurity: No Food Insecurity (04/27/2023)   Hunger Vital Sign    Worried About Running Out of Food in the Last Year: Never true    Ran Out of Food in the Last Year:  Never true  Transportation Needs: No Transportation Needs (04/27/2023)   PRAPARE - Administrator, Civil Service (Medical): No    Lack of Transportation (Non-Medical): No  Physical Activity: Not on file  Stress: Not on file  Social Connections: Not on file     Review of Systems: A 12 point ROS discussed and pertinent positives are indicated in the HPI above.  All other systems are negative.  Vital Signs: BP (!) 166/106 (BP Location: Right Arm)   Pulse 94   Temp 98.2 F (36.8 C) (Oral)   Resp 20   Ht 5\' 11"  (1.803 m)   Wt 178 lb (80.7 kg)   LMP 12/06/1976   SpO2 99%   BMI 24.83 kg/m    Physical Exam Vitals reviewed.  Constitutional:      General: She is not in acute distress.    Appearance: She is not ill-appearing.  HENT:     Head: Normocephalic.     Mouth/Throat:     Mouth: Mucous membranes are moist.     Pharynx: Oropharynx is clear.  Cardiovascular:     Rate and Rhythm: Normal rate and regular rhythm.  Pulmonary:     Effort: Pulmonary effort is normal.     Breath sounds: Normal breath sounds.  Abdominal:     General: Abdomen is flat. Bowel sounds are normal.     Palpations: Abdomen is soft.  Skin:    General: Skin is warm and dry.     Coloration: Skin is not cyanotic or jaundiced.  Neurological:     Mental Status: She is alert and oriented to person, place, and time.  Psychiatric:        Mood and Affect: Mood normal. Mood is not anxious or depressed.        Behavior: Behavior normal.     MD Evaluation Airway: WNL Heart: WNL Chest/ Lungs: WNL ASA  Classification: 3 Mallampati/Airway Score: Two  Imaging: CT ABDOMEN PELVIS W CONTRAST  Result Date: 04/24/2023 CLINICAL DATA:  Abdominal pain.  Metastatic pancreatic cancer. EXAM: CT ABDOMEN AND PELVIS WITH CONTRAST TECHNIQUE: Multidetector CT imaging of the abdomen and pelvis was performed using the standard protocol following bolus administration of intravenous contrast. RADIATION DOSE  REDUCTION: This exam was performed according to the departmental dose-optimization program which includes automated exposure control, adjustment of the mA and/or kV according to patient size and/or use of iterative reconstruction technique. CONTRAST:  OMNIPAQUE IOHEXOL 300 MG/ML  SOLN COMPARISON:  CT abdomen pelvis dated 04/03/2023. FINDINGS: Lower chest: Similar appearance of right lung base subpleural nodules measure up to 8 mm. Mitral and aortic valve repairs. No intra-abdominal free air or free fluid. Hepatobiliary: Multiple hypoenhancing hepatic metastatic disease. These lesions appear more conspicuous or have increased since the prior CT. No biliary dilatation. The gallbladder is unremarkable. Pancreas: Hypoenhancing mass involving the body and tail of the pancreas as seen previously in keeping with known malignancy. Spleen: Normal in size without focal abnormality. Adrenals/Urinary Tract: The adrenal glands are unremarkable. There is no hydronephrosis on either side. There  is symmetric enhancement and excretion of contrast by both kidneys. The visualized ureters and the bladder appear unremarkable. Stomach/Bowel: Postsurgical changes of bowel with anastomotic suture in the pelvis. There is no bowel obstruction or active inflammation. The appendix is not visualized with certainty. No inflammatory changes identified in the right lower quadrant. Vascular/Lymphatic: Mild aortoiliac atherosclerotic disease. There is tumor encasement of the celiac trunk. There is infiltration of the fat plane around the proximal SMA suggestive of encasement. The celiac artery, SMA, IMA are patent. The renal arteries are patent. There is in case mint of the proximal half of the splenic artery with mass effect and high-grade luminal narrowing. The splenic vein is not visualized and appears thrombosed. The SMV and main portal vein are patent. A 2.7 x 2.0 cm hypoenhancing soft tissue abutting the main portal vein (25/2) may  represent an enlarged and metastatic adenopathy versus infiltrative tumor. No portal venous gas. Reproductive: Hysterectomy.  No adnexal masses. Other: Midline vertical anterior pelvic wall incisional scar. Musculoskeletal: Degenerative changes of the spine. No acute osseous pathology. IMPRESSION: 1. No acute intra-abdominal or pelvic pathology. 2. Hypoenhancing mass involving the body and tail of the pancreas in keeping with known malignancy. 3. Multiple hypoenhancing hepatic metastatic disease. These lesions appear more conspicuous or have increased since the prior CT. 4. 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 6. Similar appearance of right lung base subpleural nodules measure up to 8 mm. 7.  Aortic Atherosclerosis (ICD10-I70.0). Electronically Signed   By: Elgie Collard M.D.   On: 04/24/2023 21:06   CT CHEST W CONTRAST  Result Date: 04/05/2023 CLINICAL DATA:  Lung nodule EXAM: CT CHEST WITH CONTRAST TECHNIQUE: Multidetector CT imaging of the chest was performed during intravenous contrast administration. RADIATION DOSE REDUCTION: This exam was performed according to the departmental dose-optimization program which includes automated exposure control, adjustment of the mA and/or kV according to patient size and/or use of iterative reconstruction technique. CONTRAST:  75mL OMNIPAQUE IOHEXOL 300 MG/ML  SOLN COMPARISON:  Chest CT dated May 25, 2022 FINDINGS: Cardiovascular: Normal heart size. No pericardial effusion. Normal caliber thoracic aorta with mild atherosclerotic disease. Prior tricuspid and mitral valve replacements. No suspicious filling defects of the pulmonary arteries. Mediastinum/Nodes: Esophagus and thyroid are unremarkable. No enlarged lymph nodes seen in the chest. Lungs/Pleura: Central airways are patent. No consolidation, pleural effusion or pneumothorax. Small solid pulmonary nodules. Reference solid pulmonary nodule of the left  lower lobe measuring 9 x 6 mm. Additional reference solid pulmonary nodule of the right lower lobe measuring 4 mm on series 7, image 110. Upper Abdomen: Partially visualized pancreatic tail mass and hypoattenuating liver lesions, better evaluated on recently performed MRI of the abdomen and CT of the abdomen and pelvis. Musculoskeletal: Prior median sternotomy. No aggressive appearing osseous lesions. IMPRESSION: 1. Nonspecific small solid pulmonary nodules, largest of the left lower lobe measures 9 x 6 mm. Recommend short-term follow-up chest CT in 3 months for further evaluation. 2. No evidence nodal metastatic disease in the chest. 3. Partially visualized pancreatic tail mass and hypoattenuating liver lesions, better evaluated on recently performed MRI of the abdomen and CT of the abdomen and pelvis. 4. Aortic Atherosclerosis (ICD10-I70.0). Electronically Signed   By: Allegra Lai M.D.   On: 04/05/2023 16:21   MR ABDOMEN W WO CONTRAST  Result Date: 04/04/2023 CLINICAL DATA:  Evaluate pancreas lesion. EXAM: MRI ABDOMEN WITHOUT AND WITH CONTRAST TECHNIQUE: Multiplanar multisequence MR imaging of the abdomen  was performed both before and after the administration of intravenous contrast. CONTRAST:  8mL GADAVIST GADOBUTROL 1 MMOL/ML IV SOLN COMPARISON:  CT 04/03/2023 FINDINGS: Lower chest: No acute findings. Subpleural nodule within the right lower lobe measures 7 mm, image 11/17. Hepatobiliary: Multiple rim enhancing lesions are identified within the liver. The largest is in segment 4 B measuring 3.4 x 3.1, image 32/17. Lesion within segment 6 measures 1.4 by 1.0 cm, image 54/17. Gallbladder appears within normal limits. No gallstones or gallbladder wall edema. The common bile duct measures 6 mm in maximum diameter which is considered upper limits of normal. Pancreas: There is a large mass involving the body and tail of pancreas which is hypoenhancing measuring 6.4 by 2.2 cm, image 37/19. There is diffuse  dilatation of the main duct and side branches of the main duct within the tail of pancreas compatible with main ductal obstruction. Encasement of the celiac artery and its branches are identified. At least partial encasement of the left side of the splenic artery is suspected, image 40/19. The portal vein, portal venous confluence and SMV do not appear to be involved. The splenic vein appears completely occluded. Spleen:  Within normal limits in size and appearance. Adrenals/Urinary Tract: Normal adrenal glands. No signs of kidney mass or hydronephrosis. Stomach/Bowel: Stomach appears nondistended. No dilated bowel loops identified. Vascular/Lymphatic: Normal appearance of the abdominal aorta. Lymph node within the porta hepatic region measures 1.3 cm, image 30/17. Other: Trace fluid identified around the spleen. No focal fluid collections identified. Musculoskeletal: No suspicious bone lesions identified. IMPRESSION: 1. There is a large hypoenhancing mass involving the body and tail of pancreasa compatible with pancreatic adenocarcinoma. 2. 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 At least partial encasement of the left side of the splenic artery is suspected. The portal vein, portal venous confluence and SMV do not appear to be involved. The splenic vein appears completely occluded. 3. Multiple rim enhancing lesions are identified within the liver compatible with metastatic disease. 4. Lymph node within the porta hepatic region measures 1.3 cm. Suspicious for nodal metastasis. 5. Subpleural nodule within the right lower lobe measures 7 mm. Consider further evaluation with dedicated CT of the chest. Electronically Signed   By: Signa Kell M.D.   On: 04/04/2023 07:46   MR 3D Recon At Scanner  Result Date: 04/04/2023 CLINICAL DATA:  Evaluate pancreas lesion. EXAM: MRI ABDOMEN WITHOUT AND WITH CONTRAST TECHNIQUE: Multiplanar multisequence MR imaging of the abdomen was performed  both before and after the administration of intravenous contrast. CONTRAST:  8mL GADAVIST GADOBUTROL 1 MMOL/ML IV SOLN COMPARISON:  CT 04/03/2023 FINDINGS: Lower chest: No acute findings. Subpleural nodule within the right lower lobe measures 7 mm, image 11/17. Hepatobiliary: Multiple rim enhancing lesions are identified within the liver. The largest is in segment 4 B measuring 3.4 x 3.1, image 32/17. Lesion within segment 6 measures 1.4 by 1.0 cm, image 54/17. Gallbladder appears within normal limits. No gallstones or gallbladder wall edema. The common bile duct measures 6 mm in maximum diameter which is considered upper limits of normal. Pancreas: There is a large mass involving the body and tail of pancreas which is hypoenhancing measuring 6.4 by 2.2 cm, image 37/19. There is diffuse dilatation of the main duct and side branches of the main duct within the tail of pancreas compatible with main ductal obstruction. Encasement of the celiac artery and its branches are identified. At least partial encasement of the left side of the splenic  artery is suspected, image 40/19. The portal vein, portal venous confluence and SMV do not appear to be involved. The splenic vein appears completely occluded. Spleen:  Within normal limits in size and appearance. Adrenals/Urinary Tract: Normal adrenal glands. No signs of kidney mass or hydronephrosis. Stomach/Bowel: Stomach appears nondistended. No dilated bowel loops identified. Vascular/Lymphatic: Normal appearance of the abdominal aorta. Lymph node within the porta hepatic region measures 1.3 cm, image 30/17. Other: Trace fluid identified around the spleen. No focal fluid collections identified. Musculoskeletal: No suspicious bone lesions identified. IMPRESSION: 1. There is a large hypoenhancing mass involving the body and tail of pancreasa compatible with pancreatic adenocarcinoma. 2. 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 At least partial  encasement of the left side of the splenic artery is suspected. The portal vein, portal venous confluence and SMV do not appear to be involved. The splenic vein appears completely occluded. 3. Multiple rim enhancing lesions are identified within the liver compatible with metastatic disease. 4. Lymph node within the porta hepatic region measures 1.3 cm. Suspicious for nodal metastasis. 5. Subpleural nodule within the right lower lobe measures 7 mm. Consider further evaluation with dedicated CT of the chest. Electronically Signed   By: Signa Kell M.D.   On: 04/04/2023 07:46   CT ABDOMEN PELVIS W CONTRAST  Addendum Date: 04/03/2023   ADDENDUM REPORT: 04/03/2023 22:33 ADDENDUM: Critical Value/emergent results were called by telephone at the time of interpretation on 04/03/2023 at 10:33 pm to provider Riki Sheer , who verbally acknowledged these results. Electronically Signed   By: Thornell Sartorius M.D.   On: 04/03/2023 22:33   Result Date: 04/03/2023 CLINICAL DATA:  Abdominal pain, acute, nonlocalized. EXAM: CT ABDOMEN AND PELVIS WITH CONTRAST TECHNIQUE: Multidetector CT imaging of the abdomen and pelvis was performed using the standard protocol following bolus administration of intravenous contrast. RADIATION DOSE REDUCTION: This exam was performed according to the departmental dose-optimization program which includes automated exposure control, adjustment of the mA and/or kV according to patient size and/or use of iterative reconstruction technique. CONTRAST:  75mL OMNIPAQUE IOHEXOL 300 MG/ML  SOLN COMPARISON:  12/18/2020. FINDINGS: Lower chest: A 6 mm nodule is present in the right lower lobe, axial image 9. A 1 cm nodule is noted in the right lower lobe, axial image 23. Hepatobiliary: A 1.2 cm hypodensity with surrounding enhancement is noted in the right lobe of the liver, segment 5. An ill-defined hypodensity is present in the left lobe of the liver measuring 2.3 cm, hepatic segment 4B. An ill-defined  hypodensity is present in the left lobe of the liver measuring 9 mm, hepatic segment 2. No biliary ductal dilatation. The gallbladder is without stones. Pancreas: An ill-defined hypodense mass is present in the distal pancreas measuring 9.9 x 2.9 cm. There is mild pancreatic ductal dilatation at the head of the pancreas measuring 4 mm. Mild fat stranding is noted about the tail of the pancreas. Spleen: Normal in size without focal abnormality. Adrenals/Urinary Tract: No adrenal nodule or mass. The kidneys enhance symmetrically. No renal calculus or hydronephrosis. 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 Stomach/Bowel: Stomach is partially distended. Appendix appears normal. No evidence of bowel wall thickening, distention, or inflammatory changes. No bowel obstruction, free air, or pneumatosis. Vascular/Lymphatic: Aortic atherosclerosis. Prominent lymph nodes are present in the gastrohepatic ligament. The portal vein and superior mesenteric vein are patent. Splenic vein is not definitely seen on exam and may be occluded.  Reproductive: Status post hysterectomy. No adnexal masses. Other: No abdominopelvic ascites. Musculoskeletal: Degenerative changes are present in the thoracolumbar spine. No acute or suspicious osseous abnormality. IMPRESSION: 1. Hypoenhancing masslike lesion in the tail of the pancreas measuring 9.9 x 2.9 cm. Mild surrounding fat stranding is noted. Differential diagnosis includes pancreatic adenocarcinoma versus pancreatitis. Correlation with amylase, lipase, and CA 19-9 is recommended. 2. The portal vein and superior mesenteric vein are patent. The splenic vein is not visualized on exam and may be occluded. 3. 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 4. Scattered ill-defined hypodensities in the liver new from the previous exam in the possibility of underlying  metastatic disease can not be excluded. MRI with contrast is recommended for further evaluation. 5. Aortic atherosclerosis. 6. Stable right lower lobe pulmonary nodules measuring up to 1.0 cm. Consider one of the following in 3 months for both low-risk and high-risk individuals: (a) repeat chest CT, (b) follow-up PET-CT, or (c) tissue sampling. This recommendation follows the consensus statement: Guidelines for Management of Incidental Pulmonary Nodules Detected on CT Images: From the Fleischner Society 2017; Radiology 2017; 284:228-243. 7. Remaining incidental findings as described above. Electronically Signed: By: Thornell Sartorius M.D. On: 04/03/2023 21:57    Labs:  CBC: Recent Labs    04/08/23 0705 04/09/23 0827 04/24/23 1756 04/26/23 1914  WBC 5.2 5.5 5.3 5.2  HGB 13.9 13.0 12.2 14.6  HCT 41.2 39.1 38.3 43.8  PLT 108* 110* 118* 135*    COAGS: Recent Labs    05/28/22 1639 05/29/22 0516 04/08/23 0705 04/09/23 0827 04/26/23 1914 04/27/23 0602  INR 2.6*   < > 1.0 1.0 2.8* 2.9*  APTT 39*  --   --   --   --   --    < > = values in this interval not displayed.    BMP: Recent Labs    04/09/23 0827 04/24/23 1756 04/26/23 1914 04/27/23 0602  NA 133* 135 135 135  K 3.6 4.3 3.9 4.1  CL 99 98 100 101  CO2 24 26 22 24   GLUCOSE 215* 304* 146* 129*  BUN 19 16 24* 24*  CALCIUM 8.1* 8.7* 8.8* 8.7*  CREATININE 1.03* 1.29* 0.96 0.96  GFRNONAA >60 47* >60 >60    LIVER FUNCTION TESTS: Recent Labs    04/03/23 1903 04/04/23 0746 04/24/23 1756 04/26/23 1914  BILITOT 0.5 0.7 0.5 0.9  AST 15 13* 19 20  ALT 19 17 19 18   ALKPHOS 68 63 76 87  PROT 7.5 6.4* 6.5 7.4  ALBUMIN 4.3 3.9 3.7 3.8    TUMOR MARKERS: No results for input(s): "AFPTM", "CEA", "CA199", "CHROMGRNA" in the last 8760 hours.  Assessment and Plan: 62 y.o. female with recent finding of pancreatic mass and liver lesions who is in need of liver lesion biopsy.   VS hypertensive today 166/106- BP management per  primary team. Stable otherwise.  CBC yesterday with thrombocytopenia plt 135, wnl otherwise  INR 2.9 today, last does of warfarin on 5/20 - warfarin is being held   Risks and benefits of liver lesion biopsy was discussed with the patient and/or patient's family including, but not limited to bleeding, infection, damage to adjacent structures or low yield requiring additional tests.  All of the questions were answered and there is agreement to proceed.  Consent signed and in chart.  IR will check INR  daily and will proceed with liver lesion bx when INR is in the safe range,  typically less than 1.5. Also BP will need to be better managed to decrease bleeding risk.  Made NPO at [facility].    Thank you for this interesting consult.  I greatly enjoyed meeting [name] and look forward to participating in their care.  A copy of this report was sent to the requesting provider on this date.  Electronically Signed: Willette Brace, PA-C 04/27/2023, 12:30 PM   I spent a total of 40 Minutes    in face to face in clinical consultation, greater than 50% of which was counseling/coordinating care for liver lesion bx.   This chart was dictated using voice recognition software.  Despite best efforts to proofread,  errors can occur which can change the documentation meaning.

## 2023-04-27 NOTE — Progress Notes (Signed)
ANTICOAGULATION CONSULT NOTE - Follow Up  Pharmacy Consult for warfarin Indication: atrial fibrillation  Allergies  Allergen Reactions   Norco [Hydrocodone-Acetaminophen] Itching and Other (See Comments)    Tolerates Oxycodone   Hydrocodone Itching    Tolerates oxycodone     Patient Measurements: Height: 5\' 11"  (180.3 cm) Weight: 80.7 kg (178 lb) IBW/kg (Calculated) : 70.8    Vital Signs: Temp: 98 F (36.7 C) (05/22 0416) Temp Source: Oral (05/22 0416) BP: 135/92 (05/22 0416) Pulse Rate: 63 (05/22 0416)  Labs: Recent Labs    04/24/23 1756 04/26/23 1914 04/27/23 0602  HGB 12.2 14.6  --   HCT 38.3 43.8  --   PLT 118* 135*  --   LABPROT  --  29.3* 30.2*  INR  --  2.8* 2.9*  CREATININE 1.29* 0.96 0.96     Estimated Creatinine Clearance: 67 mL/min (by C-G formula based on SCr of 0.96 mg/dL).   Medical History: Past Medical History:  Diagnosis Date   Acute pericardial effusion 05/17/2022   AKI (acute kidney injury) (HCC) 05/17/2022   Anxiety    Arthritis    Cervicalgia    CHF (congestive heart failure) (HCC)    Chondromalacia of patella    Chronic neck pain    Chronic pain syndrome    Depression    secondary to loss of her son at age 62   Diabetic neuropathy (HCC)    DMII (diabetes mellitus, type 2) (HCC)    Dysrhythmia    Enthesopathy of hip region    Fibromyalgia    GERD (gastroesophageal reflux disease)    Hep C w/o coma, chronic (HCC)    Hepatitis C    diagnosed 2005   Hypertension    Insomnia    Low back pain    Lumbago    Mitral regurgitation    Obesity    Pain in joint, upper arm    Primary localized osteoarthrosis, lower leg    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 PMH significant for recently discovered pancreatic mass - awaiting biopsy to confirm diagnosis. Pt is chronically anticoagulated on warfarin for atrial fibrillation. Pharmacy consulted to dose warfarin while inpatient.  INR on admission: 2.8 (therapeutic)  Today, 04/27/23 INR = 2.9 is therapeutic CBC (5/21): Hgb WNL, Plt slightly low Diet: Regular. Meal intake not charted.  Drug interactions: Fluconazole 14 day course to complete 5/23 Fluconazole has significant DDI with warfarin that can result in increased serum concentrations of warfarin  Pt currently scheduled for liver biopsy by IR on 05/03/23. Per outpatient cardiology notes, pt can hold warfarin 5 days prior to procedure and will not need enoxaparin bridging peri-procedurally.   Goal of Therapy:  INR 2-3   Plan:  Verbal order from MD to start holding warfarin in preparation for biopsy. No warfarin dose today. Pharmacy to sign off.   Please re-consult if needed if warfarin resumed.   Cindi Carbon, PharmD, BCPS 04/27/2023,7:44 AM

## 2023-04-27 NOTE — Assessment & Plan Note (Signed)
-  place on low dose SSI -continue statin

## 2023-04-27 NOTE — Progress Notes (Signed)
  Daily Progress Note   Patient Name: Mey Sulak       Date: 04/27/2023 DOB: Apr 08, 1961  Age: 62 y.o. MRN#: [id number] Attending Physician: Willeen Niece, MD Primary Care Physician: Chrys Racer, MD Admit Date: 04/26/2023 Length of Stay: 0 days  New consult received for palliative medicine team. Will plan to see patient as able on 04/28/23 to complete full consult regarding goals of care and symptom management. When looking at EMR, noticed IR was already consulted for biopsy. Reached out to IR team today to determine if celiac plexus nerve block would be available from their perspective to assist with pancreatic cancer pain management. IR team will discuss and review if this is an appropraite intervention for patient to assist in multimodal pain management. Will plan to complete palliative medicine consult tomorrow.    Alvester Morin, DO Palliative Care Provider PMT # [phone]

## 2023-04-27 NOTE — Progress Notes (Signed)
Notified MD Pradeep about patient having red conjunctiva in right eye. Patient having burning sensation with eye as well. Patient also having some HTN possibly related to pain. MD Pradeep placing orders for erythromycin eye drops.

## 2023-04-27 NOTE — Assessment & Plan Note (Signed)
-  due to pain and missing medications for a few days  -continue home antihypertensives

## 2023-04-27 NOTE — Progress Notes (Signed)
Brienne Posthumus   DOB:[birth year]   ZO#:[id number]   JWJ#:[id number]  Med/onc follow up   Subjective: Patient was initially seen in my office on Apr 12, 2023 for suspicion of metastatic pancreatic cancer.  She presented to emergency room for worsening abdominal pain, low appetite and low oral intake due to the uncontrolled pain.  Patient was on oxycodone 15 mg every 4 hours, and MS Contin as outpatient.  She was supposed to have liver biopsy and port placement, but IR had difficulty to get a hold of patient, and it was eventually scheduled for 05/03/2023.    Objective:  Vitals:   04/27/23 1315 04/27/23 1500  BP: (!) 166/126 (!) 148/112  Pulse: 81   Resp: 20   Temp: 98 F (36.7 C)   SpO2: 99%     Body mass index is 24.83 kg/m.  Intake/Output Summary (Last 24 hours) at 04/27/2023 1754 Last data filed at 04/26/2023 2053 Gross per 24 hour  Intake 1000 ml  Output --  Net 1000 ml     Sclerae unicteric  Oropharynx clear  No peripheral adenopathy  Lungs clear -- no rales or rhonchi  Heart regular rate and rhythm  Abdomen soft, with tenderness in the mid and upper abdomen  MSK no focal spinal tenderness, no peripheral edema  Neuro nonfocal    CBG (last 3)  Recent Labs    04/27/23 0732 04/27/23 1131 04/27/23 1633  GLUCAP 132* 85 122*     Labs:   Urine Studies No results for input(s): "UHGB", "CRYS" in the last 72 hours.  Invalid input(s): "UACOL", "UAPR", "USPG", "UPH", "UTP", "UGL", "UKET", "UBIL", "UNIT", "UROB", "ULEU", "UEPI", "UWBC", "URBC", "UBAC", "CAST", "UCOM", "BILUA"  Basic Metabolic Panel: Recent Labs  Lab 04/24/23 1756 04/26/23 1914 04/27/23 0602  NA 135 135 135  K 4.3 3.9 4.1  CL 98 100 101  CO2 26 22 24   GLUCOSE 304* 146* 129*  BUN 16 24* 24*  CREATININE 1.29* 0.96 0.96  CALCIUM 8.7* 8.8* 8.7*   GFR Estimated Creatinine Clearance: 67 mL/min (by C-G formula based on SCr of 0.96 mg/dL). Liver Function Tests: Recent Labs  Lab 04/24/23 1756  04/26/23 1914  AST 19 20  ALT 19 18  ALKPHOS 76 87  BILITOT 0.5 0.9  PROT 6.5 7.4  ALBUMIN 3.7 3.8   Recent Labs  Lab 04/24/23 1756 04/26/23 1914  LIPASE 42 40   No results for input(s): "AMMONIA" in the last 168 hours. Coagulation profile Recent Labs  Lab 04/26/23 1914 04/27/23 0602  INR 2.8* 2.9*    CBC: Recent Labs  Lab 04/24/23 1756 04/26/23 1914  WBC 5.3 5.2  NEUTROABS 3.3  --   HGB 12.2 14.6  HCT 38.3 43.8  MCV 92.7 90.5  PLT 118* 135*   Cardiac Enzymes: No results for input(s): "CKTOTAL", "CKMB", "CKMBINDEX", "TROPONINI" in the last 168 hours. BNP: Invalid input(s): "POCBNP" CBG: Recent Labs  Lab 04/27/23 0024 04/27/23 0732 04/27/23 1131 04/27/23 1633  GLUCAP 151* 132* 85 122*   D-Dimer No results for input(s): "DDIMER" in the last 72 hours. Hgb A1c No results for input(s): "HGBA1C" in the last 72 hours. Lipid Profile No results for input(s): "CHOL", "HDL", "LDLCALC", "TRIG", "CHOLHDL", "LDLDIRECT" in the last 72 hours. Thyroid function studies No results for input(s): "TSH", "T4TOTAL", "T3FREE", "THYROIDAB" in the last 72 hours.  Invalid input(s): "FREET3" Anemia work up No results for input(s): "VITAMINB12", "FOLATE", "FERRITIN", "TIBC", "IRON", "RETICCTPCT" in the last 72 hours. Microbiology No results found for this  or any previous visit (from the past 240 hour(s)).    Studies:  No results found.  Assessment: 62 y.o. female   Pancreatic mass and liver lesions, highly suspicious for metastatic pancreatic cancer Worsening abdominal pain, secondary to #1 Moderate protein and calorie malnutrition, anorexia Type 2 diabetes with neuropathy Atrial fibrillation, CHF CKD stage 3a COPD Anxiety and depression    Plan:  -Palliative care medicine has been called for consultation, they will see her tomorrow.  I appreciate their assistance for her pain management and goals of care discussion -She was on Coumadin for atrial fibrillation,  this has been held, when INR came down, IR will proceed with ultrasound guided liver biopsy.  I communicated with IR today. -She also need a port placement after cancer diagnosis is made  -Due to her multiple comorbidities, she is not a great candidate for intensive chemo, I will likely offer gemcitabine and Abraxane.  Her overall prognosis is poor.  Patient understands this is not curable disease. -Her sister has multiple questions about her pain management, I spoke with her on the phone, and reassured her that patient will go home with new prescriptions for her pain -I will f/u    Malachy Mood, MD 04/27/2023  5:54 PM

## 2023-04-27 NOTE — Assessment & Plan Note (Signed)
-  plt of 135 -continue to follow

## 2023-04-27 NOTE — Progress Notes (Signed)
PROGRESS NOTE    [name]  ZOX:[id number] DOB: Aug 21, 1961 DOA: 04/26/2023  PCP: Chrys Racer, MD  Brief Narrative:  This 62 years old female with PMH significant for rheumatic heart disease s/p MV and TV repair in 04/2022, history of pericardial effusion / tamponade s/p pericardiocentesis in June 2023, PAF on Coumadin, CKD stage IIIa, COPD, type 2 diabetes, hypertension, tobacco use presented with worsening abdominal pain. 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 Partial encasement of the left side of splenic artery. Splenic vein appears completely occluded. Had EUS with GI on 5//01/2020 with pancreatic body/tail mass biopsied but pathology unfortunately was non-diagnosed. Oncology has recommended liver biopsy with IR which she is still awaiting.  Patient reports worsening symptoms,  describes pain mostly in the epigastric region.  Patient has been taking MS Contin and oxycodone IR with partial pain relief.,  INR is 2.8.  Patient is admitted for further evaluation.  She is going to get liver biopsy once INR is below 1.5. She is admitted for further evaluation.  Assessment & Plan:   Principal Problem:   Intractable pain Active Problems:   Pancreatic cancer (HCC)   Paroxysmal atrial fibrillation (HCC)   COPD (chronic obstructive pulmonary disease) (HCC)   Type 2 diabetes mellitus with hyperlipidemia (HCC)   Hypertension associated with diabetes (HCC)   Chronic kidney disease, stage 3a (HCC)   Thrombocytopenia (HCC)  Intractable pain: -Likely in the setting of recently diagnosed metastatic pancreatic cancer. -Continue PRN IV opioids for pain control -Continue home MS Contin 30mg  BID -Continue home oxycodone IR 15mg  for breakthrough pain -will need adjustments to pain medications prior to discharge. -May also  benefit from palliative consult as her cancer appears aggressive.   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 -She has had EUS with GI on 5//01/2020 with pancreatic body / tail mass biopsied but pathology unfortunately was non-diagnosed.  -Oncology has recommended liver biopsy with IR which she is still awaiting. -Plan: Follow biopsy results will most likely be palliative chemotherapy -Following with oncologist Dr. Mosetta Putt.   COPD : -Not in any acute exacerbation.   Paroxysmal atrial fibrillation (HCC) - Heart rate well controlled. -Hold warfarin as patient is going to have liver biopsy. -Continue home beta-blocker and diltiazem   Type 2 diabetes mellitus with hyperlipidemia (HCC) -Continue low dose SSI -Continue statin   Thrombocytopenia (HCC) -plt of 135 -continue to follow   Chronic kidney disease, stage 3a (HCC) -Creatinine is stable and at baseline.   Hypertension associated with diabetes (HCC) -Elevated due to pain and missing medications for a few days. -Continue home antihypertensives. -Added hydralazine 25 mg 3 times daily for better blood pressure control    DVT prophylaxis: SCDs Code Status: Full code Family Communication: No family at bed side. Disposition Plan:    Status is: Observation The patient remains OBS appropriate and will d/c before 2 midnights.  Patient admitted for intractable abdominal pain in the setting of pancreatic cancer.   Oncology is consulted, Patient is scheduled to have liver biopsy once INR below 1.5   Consultants:  Oncology IR  Procedures:Liver biopsy  Antimicrobials: Anti-infectives (From admission, onward)    Start     Dose/Rate Route Frequency Ordered Stop   04/27/23 1200  fluconazole (DIFLUCAN) tablet 100  mg        100 mg Oral Daily 04/27/23 1027 04/29/23 0959      Subjective: Patient was seen and examined at bedside.   Overnight events noted.   She appears very deconditioned, cachectic, sick looking.  She reports having persistent abdominal pain. She denies nausea and vomiting.  Objective: Vitals:   04/27/23 0416 04/27/23 0834 04/27/23 1135 04/27/23 1315  BP: (!) 135/92 (!) 149/108 (!) 166/106 (!) 166/126  Pulse: 63 (!) 107 94 81  Resp: 16 20 20 20   Temp: 98 F (36.7 C) 98.3 F (36.8 C) 98.2 F (36.8 C) 98 F (36.7 C)  TempSrc: Oral Oral Oral Oral  SpO2: 96% 98% 99% 99%  Weight:      Height:        Intake/Output Summary (Last 24 hours) at 04/27/2023 1408 Last data filed at 04/26/2023 2053 Gross per 24 hour  Intake 1000 ml  Output --  Net 1000 ml   Filed Weights   04/26/23 1819  Weight: 80.7 kg    Examination:  General exam: Appears calm and comfortable, cachectic, sick looking, deconditioned Respiratory system: Clear to auscultation. Respiratory effort normal.  RR 15 Cardiovascular system: S1 & S2 heard, RRR. No JVD, murmurs, rubs, gallops or clicks. Gastrointestinal system: Abdomen is soft, diffusely tender , non distended, BS+ Central nervous system: Alert and oriented x 3. No focal neurological deficits. Extremities: No edema, no cyanosis, no clubbing Skin: No rashes, lesions or ulcers Psychiatry: Judgement and insight appear normal. Mood & affect appropriate.     Data Reviewed: I have personally reviewed following labs and imaging studies  CBC: Recent Labs  Lab 04/24/23 1756 04/26/23 1914  WBC 5.3 5.2  NEUTROABS 3.3  --   HGB 12.2 14.6  HCT 38.3 43.8  MCV 92.7 90.5  PLT 118* 135*   Basic Metabolic Panel: Recent Labs  Lab 04/24/23 1756 04/26/23 1914 04/27/23 0602  NA 135 135 135  K 4.3 3.9 4.1  CL 98 100 101  CO2 26 22 24   GLUCOSE 304* 146* 129*  BUN 16 24* 24*  CREATININE 1.29* 0.96 0.96  CALCIUM 8.7* 8.8* 8.7*   GFR: Estimated Creatinine Clearance: 67 mL/min (by C-G formula based on SCr of 0.96 mg/dL). Liver Function Tests: Recent Labs  Lab  04/24/23 1756 04/26/23 1914  AST 19 20  ALT 19 18  ALKPHOS 76 87  BILITOT 0.5 0.9  PROT 6.5 7.4  ALBUMIN 3.7 3.8   Recent Labs  Lab 04/24/23 1756 04/26/23 1914  LIPASE 42 40   No results for input(s): "AMMONIA" in the last 168 hours. Coagulation Profile: Recent Labs  Lab 04/26/23 1914 04/27/23 0602  INR 2.8* 2.9*   Cardiac Enzymes: No results for input(s): "CKTOTAL", "CKMB", "CKMBINDEX", "TROPONINI" in the last 168 hours. BNP (last 3 results) Recent Labs    08/24/22 1555 12/01/22 1629  PROBNP 2,927* 2,487*   HbA1C: No results for input(s): "HGBA1C" in the last 72 hours. CBG: Recent Labs  Lab 04/27/23 0024 04/27/23 0732 04/27/23 1131  GLUCAP 151* 132* 85   Lipid Profile: No results for input(s): "CHOL", "HDL", "LDLCALC", "TRIG", "CHOLHDL", "LDLDIRECT" in the last 72 hours. Thyroid Function Tests: No results for input(s): "TSH", "T4TOTAL", "FREET4", "T3FREE", "THYROIDAB" in the last 72 hours. Anemia Panel: No results for input(s): "VITAMINB12", "FOLATE", "FERRITIN", "TIBC", "IRON", "RETICCTPCT" in the last 72 hours. Sepsis Labs: No results for input(s): "PROCALCITON", "LATICACIDVEN" in the last 168 hours.  No results found for this or any previous  visit (from the past 240 hour(s)).   Radiology Studies: No results found.  Scheduled Meds:  ARIPiprazole  2.5 mg Oral Daily   atorvastatin  20 mg Oral Daily   buPROPion  300 mg Oral q AM   erythromycin   Both Eyes Q8H   fluconazole  100 mg Oral Daily   furosemide  40 mg Oral Daily   gabapentin  300 mg Oral TID   hydrALAZINE  25 mg Oral Q8H   insulin aspart  0-9 Units Subcutaneous TID WC   metoprolol tartrate  25 mg Oral BID   morphine  30 mg Oral Q12H   nicotine  21 mg Transdermal Daily   pantoprazole  40 mg Oral Daily   vortioxetine HBr  10 mg Oral Daily   Continuous Infusions:   LOS: 0 days    Time spent: 50 mins    Willeen Niece, MD [facility]   If 7PM-7AM, please contact  night-coverage

## 2023-04-27 NOTE — Progress Notes (Signed)
ANTICOAGULATION CONSULT NOTE - Initial Consult  Pharmacy Consult for Warfarin Indication: atrial fibrillation  Allergies  Allergen Reactions   Norco [Hydrocodone-Acetaminophen] Itching and Other (See Comments)    Tolerates Oxycodone   Hydrocodone Itching    Tolerates oxycodone     Patient Measurements: Height: 5\' 11"  (180.3 cm) Weight: 80.7 kg (178 lb) IBW/kg (Calculated) : 70.8    Vital Signs: Temp: 98 F (36.7 C) (05/22 0019) Temp Source: Oral (05/22 0019) BP: 160/106 (05/22 0019) Pulse Rate: 82 (05/22 0019)  Labs: Recent Labs    04/24/23 1756 04/26/23 1914  HGB 12.2 14.6  HCT 38.3 43.8  PLT 118* 135*  LABPROT  --  29.3*  INR  --  2.8*  CREATININE 1.29* 0.96    Estimated Creatinine Clearance: 67 mL/min (by C-G formula based on SCr of 0.96 mg/dL).   Medical History: Past Medical History:  Diagnosis Date   Acute pericardial effusion 05/17/2022   AKI (acute kidney injury) (HCC) 05/17/2022   Anxiety    Arthritis    Cervicalgia    CHF (congestive heart failure) (HCC)    Chondromalacia of patella    Chronic neck pain    Chronic pain syndrome    Depression    secondary to loss of her son at age 45   Diabetic neuropathy (HCC)    DMII (diabetes mellitus, type 2) (HCC)    Dysrhythmia    Enthesopathy of hip region    Fibromyalgia    GERD (gastroesophageal reflux disease)    Hep C w/o coma, chronic (HCC)    Hepatitis C    diagnosed 2005   Hypertension    Insomnia    Low back pain    Lumbago    Mitral regurgitation    Obesity    Pain in joint, upper arm    Primary localized osteoarthrosis, lower leg    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  Recently diagnosed with metastatic pancreatic cancer.  PMH also significant for COPD, PAF, DM, rheumatic heart disease and s/p MVR and TVR (2023) 5/21 INR on admission = 2.8 (therapeutic) Patient started on fluconazole on 5/10, which can increase effects of warfarin  Goal of Therapy:  INR 2-3    Plan:  No warfarin tonight (therapeutic INR) Daily INR and dose accordingly  Jayveion Stalling, Joselyn Glassman, PharmD 04/27/2023,12:40 AM

## 2023-04-27 NOTE — Progress Notes (Signed)
MD Pradeep notified in regards to patient's BP of 146/112. Diastolic BP still elevated with hydralazine given. Will continue to monitor and assess.

## 2023-04-28 DIAGNOSIS — K5900 Constipation, unspecified: Secondary | ICD-10-CM

## 2023-04-28 DIAGNOSIS — I152 Hypertension secondary to endocrine disorders: Secondary | ICD-10-CM | POA: Diagnosis present

## 2023-04-28 DIAGNOSIS — Z515 Encounter for palliative care: Secondary | ICD-10-CM | POA: Diagnosis not present

## 2023-04-28 DIAGNOSIS — R1010 Upper abdominal pain, unspecified: Secondary | ICD-10-CM

## 2023-04-28 DIAGNOSIS — I480 Paroxysmal atrial fibrillation: Secondary | ICD-10-CM | POA: Diagnosis present

## 2023-04-28 DIAGNOSIS — B182 Chronic viral hepatitis C: Secondary | ICD-10-CM | POA: Diagnosis present

## 2023-04-28 DIAGNOSIS — C787 Secondary malignant neoplasm of liver and intrahepatic bile duct: Secondary | ICD-10-CM | POA: Diagnosis present

## 2023-04-28 DIAGNOSIS — C779 Secondary and unspecified malignant neoplasm of lymph node, unspecified: Secondary | ICD-10-CM | POA: Diagnosis present

## 2023-04-28 DIAGNOSIS — E1140 Type 2 diabetes mellitus with diabetic neuropathy, unspecified: Secondary | ICD-10-CM | POA: Diagnosis present

## 2023-04-28 DIAGNOSIS — N1831 Chronic kidney disease, stage 3a: Secondary | ICD-10-CM | POA: Diagnosis present

## 2023-04-28 DIAGNOSIS — D696 Thrombocytopenia, unspecified: Secondary | ICD-10-CM | POA: Diagnosis present

## 2023-04-28 DIAGNOSIS — G893 Neoplasm related pain (acute) (chronic): Secondary | ICD-10-CM | POA: Diagnosis present

## 2023-04-28 DIAGNOSIS — E871 Hypo-osmolality and hyponatremia: Secondary | ICD-10-CM | POA: Diagnosis present

## 2023-04-28 DIAGNOSIS — E1169 Type 2 diabetes mellitus with other specified complication: Secondary | ICD-10-CM | POA: Diagnosis present

## 2023-04-28 DIAGNOSIS — F32A Depression, unspecified: Secondary | ICD-10-CM | POA: Diagnosis present

## 2023-04-28 DIAGNOSIS — R64 Cachexia: Secondary | ICD-10-CM | POA: Diagnosis present

## 2023-04-28 DIAGNOSIS — Z79899 Other long term (current) drug therapy: Secondary | ICD-10-CM | POA: Diagnosis not present

## 2023-04-28 DIAGNOSIS — M1990 Unspecified osteoarthritis, unspecified site: Secondary | ICD-10-CM | POA: Diagnosis present

## 2023-04-28 DIAGNOSIS — E11649 Type 2 diabetes mellitus with hypoglycemia without coma: Secondary | ICD-10-CM | POA: Diagnosis not present

## 2023-04-28 DIAGNOSIS — E876 Hypokalemia: Secondary | ICD-10-CM | POA: Diagnosis present

## 2023-04-28 DIAGNOSIS — R52 Pain, unspecified: Secondary | ICD-10-CM | POA: Diagnosis not present

## 2023-04-28 DIAGNOSIS — C259 Malignant neoplasm of pancreas, unspecified: Principal | ICD-10-CM

## 2023-04-28 DIAGNOSIS — R4589 Other symptoms and signs involving emotional state: Secondary | ICD-10-CM

## 2023-04-28 DIAGNOSIS — E785 Hyperlipidemia, unspecified: Secondary | ICD-10-CM | POA: Diagnosis present

## 2023-04-28 DIAGNOSIS — E1122 Type 2 diabetes mellitus with diabetic chronic kidney disease: Secondary | ICD-10-CM | POA: Diagnosis present

## 2023-04-28 DIAGNOSIS — E1165 Type 2 diabetes mellitus with hyperglycemia: Secondary | ICD-10-CM | POA: Diagnosis present

## 2023-04-28 DIAGNOSIS — M542 Cervicalgia: Secondary | ICD-10-CM | POA: Diagnosis present

## 2023-04-28 DIAGNOSIS — I700 Atherosclerosis of aorta: Secondary | ICD-10-CM | POA: Diagnosis present

## 2023-04-28 DIAGNOSIS — J449 Chronic obstructive pulmonary disease, unspecified: Secondary | ICD-10-CM | POA: Diagnosis present

## 2023-04-28 LAB — BASIC METABOLIC PANEL
Anion gap: 11 (ref 5–15)
BUN: 25 mg/dL — ABNORMAL HIGH (ref 8–23)
CO2: 22 mmol/L (ref 22–32)
Calcium: 8.4 mg/dL — ABNORMAL LOW (ref 8.9–10.3)
Chloride: 100 mmol/L (ref 98–111)
Creatinine, Ser: 0.86 mg/dL (ref 0.44–1.00)
GFR, Estimated: >60 mL/min (ref >60)
Glucose, Bld: 149 mg/dL — ABNORMAL HIGH (ref 70–99)
Potassium: 4 mmol/L (ref 3.5–5.1)
Sodium: 133 mmol/L — ABNORMAL LOW (ref 135–145)

## 2023-04-28 LAB — MAGNESIUM: Magnesium: 1.7 mg/dL (ref 1.7–2.4)

## 2023-04-28 LAB — GLUCOSE, CAPILLARY
Glucose-Capillary: 159 mg/dL — ABNORMAL HIGH (ref 70–99)
Glucose-Capillary: 79 mg/dL (ref 70–99)
Glucose-Capillary: 215 mg/dL — ABNORMAL HIGH (ref 70–99)
Glucose-Capillary: 231 mg/dL — ABNORMAL HIGH (ref 70–99)

## 2023-04-28 LAB — CBC
HCT: 42.2 % (ref 36.0–46.0)
Hemoglobin: 13.8 g/dL (ref 12.0–15.0)
MCH: 30.6 pg (ref 26.0–34.0)
MCHC: 32.7 g/dL (ref 30.0–36.0)
MCV: 93.6 fL (ref 80.0–100.0)
Platelets: 120 10*3/uL — ABNORMAL LOW (ref 150–400)
RBC: 4.51 MIL/uL (ref 3.87–5.11)
RDW: 15.5 % (ref 11.5–15.5)
WBC: 5.6 10*3/uL (ref 4.0–10.5)
nRBC: 0 % (ref 0.0–0.2)

## 2023-04-28 LAB — PROTIME-INR
INR: 3.3 — ABNORMAL HIGH (ref 0.8–1.2)
Prothrombin Time: 33.9 seconds — ABNORMAL HIGH (ref 11.4–15.2)

## 2023-04-28 LAB — PHOSPHORUS: Phosphorus: 3.4 mg/dL (ref 2.5–4.6)

## 2023-04-28 MED ORDER — OXYCODONE HCL 5 MG PO TABS: 20.0000 mg | ORAL_TABLET | ORAL | Status: DC | PRN

## 2023-04-28 MED ORDER — HYDROMORPHONE HCL 1 MG/ML IJ SOLN
0.5000 mg | INTRAMUSCULAR | Status: DC | PRN
  Administered 2023-04-28 (×2): 0.5 mg via INTRAVENOUS
  Filled 2023-04-28 (×2): qty 0.5

## 2023-04-28 MED ORDER — DEXAMETHASONE SODIUM PHOSPHATE 4 MG/ML IJ SOLN
4.0000 mg | Freq: Once | INTRAMUSCULAR | Status: AC
  Administered 2023-04-28: 4 mg via INTRAVENOUS
  Filled 2023-04-28: qty 1

## 2023-04-28 MED ORDER — POLYETHYLENE GLYCOL 3350 17 G PO PACK
17.0000 g | PACK | Freq: Every day | ORAL | Status: DC
  Administered 2023-04-28 – 2023-05-02 (×3): 17 g via ORAL
  Filled 2023-04-28 (×6): qty 1

## 2023-04-28 MED ORDER — OXYCODONE HCL 5 MG PO TABS
20.0000 mg | ORAL_TABLET | ORAL | Status: DC | PRN
  Administered 2023-04-28 – 2023-04-29 (×6): 20 mg via ORAL
  Filled 2023-04-28 (×7): qty 4

## 2023-04-28 MED ORDER — DEXAMETHASONE 4 MG PO TABS
4.0000 mg | ORAL_TABLET | Freq: Every day | ORAL | Status: DC
  Administered 2023-04-29 – 2023-05-06 (×8): 4 mg via ORAL
  Filled 2023-04-28 (×8): qty 1

## 2023-04-28 MED ORDER — SENNA 8.6 MG PO TABS
2.0000 | ORAL_TABLET | Freq: Every day | ORAL | Status: DC
  Administered 2023-04-28 – 2023-05-02 (×5): 17.2 mg via ORAL
  Filled 2023-04-28 (×9): qty 2

## 2023-04-28 NOTE — Progress Notes (Signed)
PROGRESS NOTE    [name]  WUJ:[id number] DOB: [birth year] DOA: 04/26/2023  PCP: Chrys Racer, MD  Brief Narrative:  This 62 years old female with PMH significant for rheumatic heart disease s/p MV and TV repair in 04/2022, history of pericardial effusion / tamponade s/p pericardiocentesis in June 2023, PAF on Coumadin, CKD stage IIIa, COPD, type 2 diabetes, hypertension, tobacco use presented with worsening abdominal pain. 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 Partial encasement of the left side of splenic artery. Splenic vein appears completely occluded. Had EUS with GI on 5//01/2020 with pancreatic body/tail mass biopsied but pathology unfortunately was non-diagnosed. Oncology has recommended liver biopsy with IR which she is still awaiting.  Patient reports worsening symptoms,  describes pain mostly in the epigastric region.  Patient has been taking MS Contin and oxycodone IR with partial pain relief.,  INR is 2.8.  Patient is admitted for further evaluation.  She is going to get liver biopsy once INR is below 1.5. She is admitted for further evaluation.  Assessment & Plan:   Principal Problem:   Intractable pain Active Problems:   Pancreatic cancer (HCC)   Paroxysmal atrial fibrillation (HCC)   COPD (chronic obstructive pulmonary disease) (HCC)   Type 2 diabetes mellitus with hyperlipidemia (HCC)   Hypertension associated with diabetes (HCC)   Chronic kidney disease, stage 3a (HCC)   Thrombocytopenia (HCC)  Intractable pain: -Likely in the setting of recently diagnosed metastatic pancreatic cancer. -Continue PRN IV opioids for pain control -Continue home MS Contin 30mg  BID -Continue home oxycodone IR 15mg  q6hr prn  for breakthrough pain -will need adjustments to pain medications prior to  discharge. -May also benefit from palliative consult as her cancer appears aggressive. -Patient is going to get celiac plexus nerve block for pain control.   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 -She has had EUS with GI on 5//01/2020 with pancreatic body / tail mass biopsied but pathology unfortunately was non-diagnosed.  -Oncology has recommended liver biopsy with IR which she is still awaiting. -Plan: Follow biopsy results will most likely be palliative chemotherapy -Following with oncologist Dr. Mosetta Putt. -INR should be 1.5 or less before liver biopsy and potential celiac block can be done.   COPD : -Not in any acute exacerbation.   Paroxysmal atrial fibrillation (HCC) - Heart rate well controlled. - Hold warfarin as patient is going to have liver biopsy. - Continue home beta-blocker and diltiazem   Type 2 diabetes mellitus with hyperlipidemia (HCC) -Continue low dose SSI -Continue statin   Thrombocytopenia (HCC) -plt of 135 -continue to follow   Chronic kidney disease, stage 3a (HCC) -Creatinine is stable and at baseline.   Hypertension associated with diabetes (HCC) -Elevated due to pain and missing medications for a few days. -Continue home antihypertensives. -Added hydralazine 25 mg 3 times daily for better blood pressure control    DVT prophylaxis: SCDs Code Status: Full code Family Communication: No family at bed side. Disposition Plan:    Status is: Observation The patient remains OBS appropriate and will d/c before 2 midnights.  Patient admitted for intractable abdominal pain in the setting of pancreatic cancer.   Oncology is consulted, Patient is scheduled to have liver biopsy once INR below 1.5   Consultants:  Oncology IR  Procedures:Liver biopsy  Antimicrobials: Anti-infectives (From admission, onward)    Start     Dose/Rate Route Frequency  Ordered Stop   04/27/23 1200  fluconazole (DIFLUCAN) tablet 100 mg        100 mg Oral Daily 04/27/23 1027 04/28/23 1044      Subjective: Patient was seen and examined at bedside.  Overnight events noted.   Patient appears very deconditioned, cachectic, sick looking.  She reports persistent abdominal pain. She denies any nausea and vomiting.  Pain medications has been resumed.  Objective: Vitals:   04/27/23 1500 04/27/23 2027 04/27/23 2056 04/28/23 0505  BP: (!) 148/112 (!) 152/106  130/85  Pulse:  (!) 47 82 62  Resp:  18  18  Temp:  98.2 F (36.8 C)  97.6 F (36.4 C)  TempSrc:  Oral  Oral  SpO2:  96%  98%  Weight:      Height:        Intake/Output Summary (Last 24 hours) at 04/28/2023 1258 Last data filed at 04/28/2023 1517 Gross per 24 hour  Intake 0 ml  Output --  Net 0 ml   Filed Weights   04/26/23 1819  Weight: 80.7 kg    Examination:  General exam: Appears comfortable, cachectic, sick looking, deconditioned Respiratory system: CTA bilaterally,  Respiratory effort normal.  RR 13 Cardiovascular system: S1 & S2 heard, RRR. No JVD, murmurs, rubs, gallops or clicks. Gastrointestinal system: Abdomen is soft, diffusely tender , non distended, BS+ Central nervous system: Alert and oriented x 3. No focal neurological deficits. Extremities: No edema, no cyanosis, no clubbing Skin: No rashes, lesions or ulcers Psychiatry: Judgement and insight appear normal. Mood & affect appropriate.     Data Reviewed: I have personally reviewed following labs and imaging studies  CBC: Recent Labs  Lab 04/24/23 1756 04/26/23 1914 04/28/23 0610  WBC 5.3 5.2 5.6  NEUTROABS 3.3  --   --   HGB 12.2 14.6 13.8  HCT 38.3 43.8 42.2  MCV 92.7 90.5 93.6  PLT 118* 135* 120*   Basic Metabolic Panel: Recent Labs  Lab 04/24/23 1756 04/26/23 1914 04/27/23 0602 04/28/23 0610  NA 135 135 135 133*  K 4.3 3.9 4.1 4.0  CL 98 100 101 100  CO2 26 22 24 22   GLUCOSE 304* 146* 129* 149*   BUN 16 24* 24* 25*  CREATININE 1.29* 0.96 0.96 0.86  CALCIUM 8.7* 8.8* 8.7* 8.4*  MG  --   --   --  1.7  PHOS  --   --   --  3.4   GFR: Estimated Creatinine Clearance: 74.8 mL/min (by C-G formula based on SCr of 0.86 mg/dL). Liver Function Tests: Recent Labs  Lab 04/24/23 1756 04/26/23 1914  AST 19 20  ALT 19 18  ALKPHOS 76 87  BILITOT 0.5 0.9  PROT 6.5 7.4  ALBUMIN 3.7 3.8   Recent Labs  Lab 04/24/23 1756 04/26/23 1914  LIPASE 42 40   No results for input(s): "AMMONIA" in the last 168 hours. Coagulation Profile: Recent Labs  Lab 04/26/23 1914 04/27/23 0602 04/28/23 0610  INR 2.8* 2.9* 3.3*   Cardiac Enzymes: No results for input(s): "CKTOTAL", "CKMB", "CKMBINDEX", "TROPONINI" in the last 168 hours. BNP (last 3 results) Recent Labs    08/24/22 1555 12/01/22 1629  PROBNP 2,927* 2,487*   HbA1C: No results for input(s): "HGBA1C" in the last 72 hours. CBG: Recent Labs  Lab 04/27/23 1131 04/27/23 1633 04/27/23 2029 04/28/23 0730 04/28/23 1218  GLUCAP 85 122* 129* 159* 79   Lipid Profile: No results for input(s): "CHOL", "HDL", "LDLCALC", "TRIG", "CHOLHDL", "LDLDIRECT" in the last 72 hours. Thyroid Function Tests: No results for input(s): "TSH", "T4TOTAL", "FREET4", "T3FREE", "THYROIDAB" in the last 72 hours. Anemia Panel: No results for input(s): "VITAMINB12", "FOLATE", "FERRITIN", "TIBC", "IRON", "RETICCTPCT" in the last 72 hours. Sepsis Labs: No results for input(s): "PROCALCITON", "LATICACIDVEN" in the last 168 hours.  No results found for this or any previous visit (from the past 240 hour(s)).   Radiology Studies: No results found.  Scheduled Meds:  ARIPiprazole  2.5 mg Oral Daily   atorvastatin  20 mg Oral Daily   buPROPion  300 mg Oral q AM   erythromycin   Both Eyes Q8H   furosemide  40 mg Oral Daily   gabapentin  300 mg Oral TID   hydrALAZINE  25 mg Oral Q8H   insulin aspart  0-9 Units Subcutaneous TID WC   metoprolol tartrate  25 mg  Oral BID   morphine  30 mg Oral Q12H   nicotine  21 mg Transdermal Daily   pantoprazole  40 mg Oral Daily   vortioxetine HBr  10 mg Oral Daily   Continuous Infusions:   LOS: 0 days    Time spent: 35 mins    Willeen Niece, MD [facility]   If 7PM-7AM, please contact night-coverage

## 2023-04-28 NOTE — Progress Notes (Signed)
Patient ID: [name], female   DOB: 07-07-1961, 62 y.o.   MRN: [id number] Pt's PT/INR today is 33.9/3.3. Ideally , INR should be 1.5 or less before we can safely perform liver biopsy or potential celiac block.

## 2023-04-28 NOTE — Care Management Obs Status (Signed)
MEDICARE OBSERVATION STATUS NOTIFICATION   Patient Details  Name: [name] MRN: [id number] Date of Birth: Apr 07, 1961   Medicare Observation Status Notification Given:  Yes    Beckie Busing, RN 04/28/2023, 12:05 PM

## 2023-04-28 NOTE — Consult Note (Signed)
Consultation Note Date: 04/28/2023   Patient Name: [name]  DOB: [birth year]  MRN: [id number]  Age / Sex: 62 y.o., female   PCP: Chrys Racer, MD Referring Physician: Willeen Niece, MD  Reason for Consultation: Establishing goals of care and Symptom Management     Chief Complaint/History of Present Illness:   Patient is a 62 year old female with a past medical history of rheumatic heart disease status post MV and TV repair in 04/2022, history of pericardial effusion/tamponade status post pericardiocentesis in 05/2022, PAF on Coumadin, CKD, COPD, type 2 diabetes, hypertension, tobacco use, and recent diagnosis of 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  Patient has already undergone EUS with GI on 5/2 though pathology unfortunately was not nonconclusive.  Oncology following for pathology.  IR has been consulted to obtain another liver biopsy.  Palliative medicine team consulted to assist with complex medical decision making and pain management.  Extensive review of EMR prior to presenting to bedside.  At time of EMR review patient has been receiving MS Contin 30 mg every 12 hours scheduled, oxycodone 15 mg every 6 hours as needed x 4 doses, and IV Dilaudid x 0 doses.  Patient also taking gabapentin 300 mg 3 times daily. Has already been evaluated by Dr. Mosetta Putt.  Patient not a candidate for intensive chemotherapy due to her multiple multiple medical comorbidities.  Will instead be offered alternative medications for therapy with noted all overall prognosis being poor.  Presented to bedside to meet with patient.  Introduced myself and the role of the palliative medicine team. Palliative medicine is specialized medical care  for people living with serious illness. It focuses on providing relief from the symptoms and stress of a serious illness. The goal is to improve quality of life for both the patient and the family.  Patient able to discuss her medical journey up into this point and worsening abdominal pain over the past 2 weeks.  Spent time discussing patient's pain in depth.  Patient's pain is primarily located in her epigastric region of her abdomen.  The pain is constant and feels like "someone hit her in the stomach".  Patient does feel that the opioid medications help with managing her pain overall though it is not quite enough to control it to where she can be more functional.  Patient denies any adverse effects to opioids such as hallucinations, lethargy, or nausea.  Discussed patient's pain regimen and determined will continue on MS Contin 30 mg every 12 hours scheduled.  Patient feels the oxycodone dose does not last quite long enough so noted would increase oxycodone to 20 mg every 4 hours as needed.  Patient will of IV pain medication available if needed after oral medication.  Also discussed with patient addition of steroids as patient is describing visceral aspects of pain which is likely due to the expansion of her liver due to metastatic disease.  Patient denies any adverse effects to steroids.  Noted would start on steroid twice daily today.  Also explained to patient that had already reached out to IR to discuss idea of celiac plexus nerve block to determine if this can assist with multimodal pain management for the patient.  Patient is agreeing with discussing this possible block with IR when it  is appropriate for her to obtain the biopsy at the same time.  Patient agreeing with this plan.  Also reviewed patient's bowel movements.  Patient notes she has not had a bowel movement in many days.  Discussed importance of managing constipation as can make abdominal pain worse.  Noted would start senna and MiraLAX  today.  Discussed with patient that if she does not have a bowel movement today, will likely need suppository tomorrow.  Patient agreeing with this plan.  Spent time providing emotional support via active listening.  Thanked patient for allowing me to visit today and noted palliative medicine team would continue to follow along with patient's medical journey.  Primary Diagnoses  Present on Admission:  Intractable pain  Pancreatic cancer (HCC)  Paroxysmal atrial fibrillation (HCC)  COPD (chronic obstructive pulmonary disease) (HCC)  Type 2 diabetes mellitus with hyperlipidemia (HCC)  Chronic kidney disease, stage 3a (HCC)  Hypertension associated with diabetes (HCC)   Palliative Review of Systems: Abdominal pain, constipation Past Medical History:  Diagnosis Date   Acute pericardial effusion 05/17/2022   AKI (acute kidney injury) (HCC) 05/17/2022   Anxiety    Arthritis    Cervicalgia    CHF (congestive heart failure) (HCC)    Chondromalacia of patella    Chronic neck pain    Chronic pain syndrome    Depression    secondary to loss of her son at age 19   Diabetic neuropathy (HCC)    DMII (diabetes mellitus, type 2) (HCC)    Dysrhythmia    Enthesopathy of hip region    Fibromyalgia    GERD (gastroesophageal reflux disease)    Hep C w/o coma, chronic (HCC)    Hepatitis C    diagnosed 2005   Hypertension    Insomnia    Low back pain    Lumbago    Mitral regurgitation    Obesity    Pain in joint, upper arm    Primary localized osteoarthrosis, lower leg    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   Substance abuse (HCC)    sober since 2001   Tobacco abuse    Tricuspid regurgitation    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   Social History   Socioeconomic History   Marital status: Single    Spouse name: Not on file   Number of children: 2   Years of education: Not on file   Highest education level: Not on file  Occupational History   Occupation:  CNA    Comment: worked for 15 years   Occupation: disability  Tobacco Use   Smoking status: Every Day    Packs/day: 0.25    Years: 35.00    Additional pack years: 0.00    Total pack years: 8.75    Types: Cigarettes   Smokeless tobacco: Never   Tobacco comments:    4 cigarettes a day  Vaping Use   Vaping Use: Some days  Substance and Sexual Activity   Alcohol use: No   Drug use: No   Sexual activity: Not Currently    Comment: Celebate since 2000  Other Topics Concern   Not on file  Social History Narrative   Born and raised by mom until mom died when she was 14yo. After that she stayed with her aunt. Dad was married to someone else and was never around.  Pt has 1 brother and 1 sister and pt is the middle child. Pt had a very rough childhood. Never married.  2 kids one son died several  yrs ago and other son has Schizophrenia. Pt has graduated HS and has some college. Pt is on disability and is unemployed. She used to work as a Lawyer until 2000.       Financial assistance approved for 100% discount at [facility] and has [facility] card   Rudell Cobb  September 29, 2010 2:27 PM   Social Determinants of Health   Financial Resource Strain: Not on file  Food Insecurity: No Food Insecurity (04/27/2023)   Hunger Vital Sign    Worried About Running Out of Food in the Last Year: Never true    Ran Out of Food in the Last Year: Never true  Transportation Needs: No Transportation Needs (04/27/2023)   PRAPARE - Administrator, Civil Service (Medical): No    Lack of Transportation (Non-Medical): No  Physical Activity: Not on file  Stress: Not on file  Social Connections: Not on file   Family History  Problem Relation Age of Onset   Uterine cancer Mother    Alcohol abuse Father    Alcohol abuse Sister    Drug abuse Sister    Drug abuse Brother    Alcohol abuse Brother    Schizophrenia Son    Diabetes Other    Arthritis Other    Hypertension Other    Heart attack Son 1       Died  suddenly   Scheduled Meds:  ARIPiprazole  2.5 mg Oral Daily   atorvastatin  20 mg Oral Daily   buPROPion  300 mg Oral q AM   erythromycin   Both Eyes Q8H   fluconazole  100 mg Oral Daily   furosemide  40 mg Oral Daily   gabapentin  300 mg Oral TID   hydrALAZINE  25 mg Oral Q8H   insulin aspart  0-9 Units Subcutaneous TID WC   metoprolol tartrate  25 mg Oral BID   morphine  30 mg Oral Q12H   nicotine  21 mg Transdermal Daily   pantoprazole  40 mg Oral Daily   vortioxetine HBr  10 mg Oral Daily   Continuous Infusions: PRN Meds:.acetaminophen, hydrALAZINE, HYDROmorphone (DILAUDID) injection, ondansetron (ZOFRAN) IV, mouth rinse, oxyCODONE Allergies  Allergen Reactions   Norco [Hydrocodone-Acetaminophen] Itching and Other (See Comments)    Tolerates Oxycodone   Hydrocodone Itching    Tolerates oxycodone    CBC:    Component Value Date/Time   WBC 5.6 04/28/2023 0610   HGB 13.8 04/28/2023 0610   HGB 9.5 (L) 12/20/2022 1231   HCT 42.2 04/28/2023 0610   HCT 31.7 (L) 12/20/2022 1231   PLT 120 (L) 04/28/2023 0610   PLT 256 12/20/2022 1231   MCV 93.6 04/28/2023 0610   MCV 82 12/20/2022 1231   NEUTROABS 3.3 04/24/2023 1756   LYMPHSABS 1.4 04/24/2023 1756   MONOABS 0.4 04/24/2023 1756   EOSABS 0.1 04/24/2023 1756   BASOSABS 0.0 04/24/2023 1756   Comprehensive Metabolic Panel:    Component Value Date/Time   NA 133 (L) 04/28/2023 0610   NA 141 02/28/2023 0958   K 4.0 04/28/2023 0610   CL 100 04/28/2023 0610   CO2 22 04/28/2023 0610   BUN 25 (H) 04/28/2023 0610   BUN 29 (H) 02/28/2023 0958   CREATININE 0.86 04/28/2023 0610   CREATININE 1.28 (H) 01/02/2021 0942   GLUCOSE 149 (H) 04/28/2023 0610   CALCIUM 8.4 (L) 04/28/2023 0610   AST 20 04/26/2023 1914   ALT 18 04/26/2023  1914   ALKPHOS 87 04/26/2023 1914   BILITOT 0.9 04/26/2023 1914   PROT 7.4 04/26/2023 1914   ALBUMIN 3.8 04/26/2023 1914    Physical Exam: Vital Signs: BP 130/85 (BP Location: Right Arm)   Pulse  62   Temp 97.6 F (36.4 C) (Oral)   Resp 18   Ht 5\' 11"  (1.803 m)   Wt 80.7 kg   LMP 12/06/1976   SpO2 98%   BMI 24.83 kg/m  SpO2: SpO2: 98 % O2 Device: O2 Device: Room Air O2 Flow Rate:   Intake/output summary:  Intake/Output Summary (Last 24 hours) at 04/28/2023 1610 Last data filed at 04/28/2023 9604 Gross per 24 hour  Intake 0 ml  Output --  Net 0 ml   LBM: Last BM Date :  (per patient 3 days ago) Baseline Weight: Weight: 80.7 kg Most recent weight: Weight: 80.7 kg  General: NAD, alert, laying in bed, chronically ill-appearing Eyes: No drainage noted HENT: moist mucous membranes Cardiovascular: RRR Respiratory: no increased work of breathing noted, not in respiratory distress Abdomen: distended Skin: no rashes or lesions on visible skin Neuro: A&Ox4, following commands easily Psych: appropriately answers all questions          Palliative Performance Scale: 50%              Additional Data Reviewed: Recent Labs    04/26/23 1914 04/27/23 0602 04/28/23 0610  WBC 5.2  --  5.6  HGB 14.6  --  13.8  PLT 135*  --  120*  NA 135 135 133*  BUN 24* 24* 25*  CREATININE 0.96 0.96 0.86    Imaging: CT ABDOMEN PELVIS W CONTRAST CLINICAL DATA:  Abdominal pain.  Metastatic pancreatic cancer.  EXAM: CT ABDOMEN AND PELVIS WITH CONTRAST  TECHNIQUE: Multidetector CT imaging of the abdomen and pelvis was performed using the standard protocol following bolus administration of intravenous contrast.  RADIATION DOSE REDUCTION: This exam was performed according to the departmental dose-optimization program which includes automated exposure control, adjustment of the mA and/or kV according to patient size and/or use of iterative reconstruction technique.  CONTRAST:  OMNIPAQUE IOHEXOL 300 MG/ML  SOLN  COMPARISON:  CT abdomen pelvis dated 04/03/2023.  FINDINGS: Lower chest: Similar appearance of right lung base subpleural nodules measure up to 8 mm. Mitral and  aortic valve repairs.  No intra-abdominal free air or free fluid.  Hepatobiliary: Multiple hypoenhancing hepatic metastatic disease. These lesions appear more conspicuous or have increased since the prior CT. No biliary dilatation. The gallbladder is unremarkable.  Pancreas: Hypoenhancing mass involving the body and tail of the pancreas as seen previously in keeping with known malignancy.  Spleen: Normal in size without focal abnormality.  Adrenals/Urinary Tract: The adrenal glands are unremarkable. There is no hydronephrosis on either side. There is symmetric enhancement and excretion of contrast by both kidneys. The visualized ureters and the bladder appear unremarkable.  Stomach/Bowel: Postsurgical changes of bowel with anastomotic suture in the pelvis. There is no bowel obstruction or active inflammation. The appendix is not visualized with certainty. No inflammatory changes identified in the right lower quadrant.  Vascular/Lymphatic: Mild aortoiliac atherosclerotic disease. There is tumor encasement of the celiac trunk. There is infiltration of the fat plane around the proximal SMA suggestive of encasement. The celiac artery, SMA, IMA are patent. The renal arteries are patent. There is in case mint of the proximal half of the splenic artery with mass effect and high-grade luminal narrowing. The splenic vein is not visualized  and appears thrombosed. The SMV and main portal vein are patent. A 2.7 x 2.0 cm hypoenhancing soft tissue abutting the main portal vein (25/2) may represent an enlarged and metastatic adenopathy versus infiltrative tumor. No portal venous gas.  Reproductive: Hysterectomy.  No adnexal masses.  Other: Midline vertical anterior pelvic wall incisional scar.  Musculoskeletal: Degenerative changes of the spine. No acute osseous pathology.  IMPRESSION: 1. No acute intra-abdominal or pelvic pathology. 2. Hypoenhancing mass involving the body and tail of  the pancreas in keeping with known malignancy. 3. Multiple hypoenhancing hepatic metastatic disease. These lesions appear more conspicuous or have increased since the prior CT. 4. 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 6. Similar appearance of right lung base subpleural nodules measure up to 8 mm. 7.  Aortic Atherosclerosis (ICD10-I70.0).  Electronically Signed   By: Elgie Collard M.D.   On: 04/24/2023 21:06    I personally reviewed recent imaging.   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  Patient has already undergone EUS with GI on 5/2 though pathology unfortunately was not nonconclusive.  Oncology following for pathology.  IR has been consulted to obtain another liver biopsy.  Palliative medicine team consulted to assist with complex medical decision making and pain management.  # Complex medical decision making/goals of care  -Patient continuing with current cancer directed workup and therapies.  Did not discuss in detail patient's planning for medical therapies moving forward as today was focused on patient's symptoms and introduction of palliative medicine team to patient.  -  Code Status: Full Code   # Symptom management  -Pain, acute in the  setting of recently diagnosed likely pancreatic cancer with metastatic disease to liver Patient describing aspects of both somatic and visceral pain.   -Continue MS Contin 30 mg every 12 hours scheduled.  May need to further increase based off of patient's short acting OME requirements.   -Increase p.o. oxycodone to 20 mg every 4 hours as needed.   -Continue IV Dilaudid 0.5 mg for breakthrough pain after oral medication   -Start dexamethasone 4 mg daily to assist with inflammatory aspects of visceral pain from liver.  Will provide IV dexamethasone today and started on oral dexamethasone tomorrow.   -Discussed with IR evaluation for possible celiac plexus nerve block to assist with multimodal pain management.   -Constipation Patient notes no bowel movement in multiple days.   -Start senna 2 tabs today along with MiraLAX 17 g daily   -If patient does not have bowel movement by tomorrow, will need to proceed with suppository  # Psycho-social/Spiritual Support:  - Support System: sister, brother, niece as per EMR review  # Discharge Planning:  TBD  -Will likely need outpatient PMT referral to [facility].   Thank you for allowing the palliative care team to participate in the care [name].  Alvester Morin, DO Palliative Care Provider PMT # [phone]  If patient remains symptomatic despite maximum doses, please call PMT at [phone] between 0700 and 1900. Outside of these hours, please call attending, as PMT does not have night coverage.  This provider spent a total of 80 minutes providing patient's care.  Includes review of EMR, discussing care with other staff members involved in patient's medical care, obtaining relevant history and information from patient and/or patient's family, and personal review of imaging and lab work. Greater than 50% of the time was spent counseling and coordinating care related to the above assessment and plan.     *Please note that this is a verbal  dictation therefore any spelling or grammatical errors are due to the "[employer] One" system interpretation.

## 2023-04-29 ENCOUNTER — Other Ambulatory Visit: Payer: Self-pay | Admitting: Internal Medicine

## 2023-04-29 DIAGNOSIS — C259 Malignant neoplasm of pancreas, unspecified: Secondary | ICD-10-CM | POA: Diagnosis not present

## 2023-04-29 DIAGNOSIS — R1010 Upper abdominal pain, unspecified: Secondary | ICD-10-CM | POA: Diagnosis not present

## 2023-04-29 DIAGNOSIS — Z515 Encounter for palliative care: Secondary | ICD-10-CM | POA: Diagnosis not present

## 2023-04-29 DIAGNOSIS — R52 Pain, unspecified: Secondary | ICD-10-CM | POA: Diagnosis not present

## 2023-04-29 DIAGNOSIS — K5900 Constipation, unspecified: Secondary | ICD-10-CM | POA: Diagnosis not present

## 2023-04-29 LAB — CBC
HCT: 40.9 % (ref 36.0–46.0)
Hemoglobin: 13.5 g/dL (ref 12.0–15.0)
MCH: 29.9 pg (ref 26.0–34.0)
MCHC: 33 g/dL (ref 30.0–36.0)
MCV: 90.5 fL (ref 80.0–100.0)
Platelets: 117 10*3/uL — ABNORMAL LOW (ref 150–400)
RBC: 4.52 MIL/uL (ref 3.87–5.11)
RDW: 14.8 % (ref 11.5–15.5)
WBC: 4.5 10*3/uL (ref 4.0–10.5)
nRBC: 0 % (ref 0.0–0.2)

## 2023-04-29 LAB — BASIC METABOLIC PANEL
Anion gap: 8 (ref 5–15)
BUN: 27 mg/dL — ABNORMAL HIGH (ref 8–23)
CO2: 25 mmol/L (ref 22–32)
Calcium: 8.7 mg/dL — ABNORMAL LOW (ref 8.9–10.3)
Chloride: 99 mmol/L (ref 98–111)
Creatinine, Ser: 0.95 mg/dL (ref 0.44–1.00)
GFR, Estimated: >60 mL/min (ref >60)
Glucose, Bld: 206 mg/dL — ABNORMAL HIGH (ref 70–99)
Potassium: 4.3 mmol/L (ref 3.5–5.1)
Sodium: 132 mmol/L — ABNORMAL LOW (ref 135–145)

## 2023-04-29 LAB — TYPE AND SCREEN
ABO/RH(D): B POS
Antibody Screen: NEGATIVE

## 2023-04-29 LAB — GLUCOSE, CAPILLARY
Glucose-Capillary: 193 mg/dL — ABNORMAL HIGH (ref 70–99)
Glucose-Capillary: 187 mg/dL — ABNORMAL HIGH (ref 70–99)
Glucose-Capillary: 300 mg/dL — ABNORMAL HIGH (ref 70–99)
Glucose-Capillary: 242 mg/dL — ABNORMAL HIGH (ref 70–99)

## 2023-04-29 LAB — PROTIME-INR
INR: 3.2 — ABNORMAL HIGH (ref 0.8–1.2)
INR: 2 — ABNORMAL HIGH (ref 0.8–1.2)
Prothrombin Time: 32.7 seconds — ABNORMAL HIGH (ref 11.4–15.2)
Prothrombin Time: 22.9 seconds — ABNORMAL HIGH (ref 11.4–15.2)

## 2023-04-29 LAB — PREPARE FRESH FROZEN PLASMA

## 2023-04-29 LAB — MAGNESIUM: Magnesium: 1.9 mg/dL (ref 1.7–2.4)

## 2023-04-29 LAB — BPAM FFP
Unit Type and Rh: 1700
Unit Type and Rh: 1700

## 2023-04-29 LAB — PHOSPHORUS: Phosphorus: 3.9 mg/dL (ref 2.5–4.6)

## 2023-04-29 MED ORDER — INSULIN ASPART 100 UNIT/ML IJ SOLN
2.0000 [IU] | Freq: Once | INTRAMUSCULAR | Status: AC
  Administered 2023-04-29: 2 [IU] via SUBCUTANEOUS

## 2023-04-29 MED ORDER — LIP MEDEX EX OINT
1.0000 | TOPICAL_OINTMENT | CUTANEOUS | Status: DC | PRN
  Administered 2023-04-29: 1 via TOPICAL
  Filled 2023-04-29: qty 7

## 2023-04-29 MED ORDER — SODIUM CHLORIDE 0.9% IV SOLUTION: Freq: Once | INTRAVENOUS | Status: DC

## 2023-04-29 MED ORDER — HYDROMORPHONE HCL 1 MG/ML IJ SOLN
0.5000 mg | Freq: Once | INTRAMUSCULAR | Status: AC
  Administered 2023-04-30: 0.5 mg via INTRAVENOUS

## 2023-04-29 MED ORDER — MORPHINE SULFATE ER 15 MG PO TBCR
30.0000 mg | EXTENDED_RELEASE_TABLET | Freq: Three times a day (TID) | ORAL | Status: DC
  Administered 2023-04-29 – 2023-05-01 (×6): 30 mg via ORAL
  Filled 2023-04-29 (×6): qty 2

## 2023-04-29 MED ORDER — BISACODYL 10 MG RE SUPP
10.0000 mg | Freq: Once | RECTAL | Status: AC
  Administered 2023-04-29: 10 mg via RECTAL
  Filled 2023-04-29: qty 1

## 2023-04-29 MED ORDER — HYDROCORTISONE 1 % EX CREA
1.0000 | TOPICAL_CREAM | Freq: Three times a day (TID) | CUTANEOUS | Status: DC | PRN
  Administered 2023-04-29: 1 via TOPICAL
  Filled 2023-04-29 (×2): qty 28

## 2023-04-29 MED ORDER — TRAZODONE HCL 50 MG PO TABS
50.0000 mg | ORAL_TABLET | Freq: Every evening | ORAL | Status: AC | PRN
  Administered 2023-04-29 (×2): 50 mg via ORAL
  Filled 2023-04-29 (×2): qty 1

## 2023-04-29 MED ORDER — HYDROMORPHONE HCL 1 MG/ML IJ SOLN
0.5000 mg | INTRAMUSCULAR | Status: DC | PRN
  Administered 2023-04-29 – 2023-05-05 (×17): 0.5 mg via INTRAVENOUS
  Filled 2023-04-29 (×19): qty 0.5

## 2023-04-29 MED ORDER — OXYCODONE HCL 5 MG PO TABS
20.0000 mg | ORAL_TABLET | ORAL | Status: DC | PRN
  Administered 2023-04-29 – 2023-05-06 (×38): 20 mg via ORAL
  Filled 2023-04-29 (×39): qty 4

## 2023-04-29 MED ORDER — POLYVINYL ALCOHOL 1.4 % OP SOLN
1.0000 [drp] | OPHTHALMIC | Status: DC | PRN
  Filled 2023-04-29: qty 15

## 2023-04-29 MED ORDER — MORPHINE SULFATE ER 15 MG PO TBCR
30.0000 mg | EXTENDED_RELEASE_TABLET | Freq: Once | ORAL | Status: AC
  Administered 2023-04-29: 30 mg via ORAL
  Filled 2023-04-29: qty 2

## 2023-04-29 NOTE — Inpatient Diabetes Management (Signed)
Inpatient Diabetes Program Recommendations  AACE/ADA: New Consensus Statement on Inpatient Glycemic Control (2015)  Target Ranges:  Prepandial:   less than 140 mg/dL      Peak postprandial:   less than 180 mg/dL (1-2 hours)      Critically ill patients:  140 - 180 mg/dL   Lab Results  Component Value Date   GLUCAP 193 (H) 04/29/2023   HGBA1C 8.7 (H) 04/04/2023    Review of Glycemic Control  Diabetes history: DM2 Outpatient Diabetes medications: Farxiga 10 QD, tradjenta 5 mg QD, Lantus 15 QD Current orders for Inpatient glycemic control: Novolog 0-9 units TID, Decadron 4 mg QD  HgbA1C - 8.7%  Inpatient Diabetes Program Recommendations:    Consider adding Semglee 8 units QD  Continue to follow glucose trends.  Thank you. Ailene Ards, RD, LDN, CDCES Inpatient Diabetes Coordinator [phone]

## 2023-04-29 NOTE — Progress Notes (Signed)
[name]   DOB:May 16, 1961   WU#:[id number]   GNF#:[id number]  Med/onc follow up   Subjective: Patient complains abdominal pain, overall improved since admission, but feels not well-controlled.  No nausea, or other new complaints.   Objective:  Vitals:   04/29/23 1505 04/29/23 1615  BP: (!) 154/88 (!) 142/85  Pulse: 66 68  Resp: 18 16  Temp: 98 F (36.7 C) 98.2 F (36.8 C)  SpO2: 96% 98%    Body mass index is 24.83 kg/m.  Intake/Output Summary (Last 24 hours) at 04/29/2023 1821 Last data filed at 04/29/2023 1615 Gross per 24 hour  Intake 431.25 ml  Output 300 ml  Net 131.25 ml     Sclerae unicteric  Oropharynx clear  No peripheral adenopathy  Lungs clear -- no rales or rhonchi  Heart regular rate and rhythm  Abdomen soft, with tenderness in the mid and upper abdomen  MSK no focal spinal tenderness, no peripheral edema  Neuro nonfocal    CBG (last 3)  Recent Labs    04/29/23 0718 04/29/23 1209 04/29/23 1737  GLUCAP 193* 187* 300*     Labs:   Urine Studies No results for input(s): "UHGB", "CRYS" in the last 72 hours.  Invalid input(s): "UACOL", "UAPR", "USPG", "UPH", "UTP", "UGL", "UKET", "UBIL", "UNIT", "UROB", "ULEU", "UEPI", "UWBC", "URBC", "UBAC", "CAST", "UCOM", "BILUA"  Basic Metabolic Panel: Recent Labs  Lab 04/24/23 1756 04/26/23 1914 04/27/23 0602 04/28/23 0610 04/29/23 0548  NA 135 135 135 133* 132*  K 4.3 3.9 4.1 4.0 4.3  CL 98 100 101 100 99  CO2 26 22 24 22 25   GLUCOSE 304* 146* 129* 149* 206*  BUN 16 24* 24* 25* 27*  CREATININE 1.29* 0.96 0.96 0.86 0.95  CALCIUM 8.7* 8.8* 8.7* 8.4* 8.7*  MG  --   --   --  1.7 1.9  PHOS  --   --   --  3.4 3.9   GFR Estimated Creatinine Clearance: 67.7 mL/min (by C-G formula based on SCr of 0.95 mg/dL). Liver Function Tests: Recent Labs  Lab 04/24/23 1756 04/26/23 1914  AST 19 20  ALT 19 18  ALKPHOS 76 87  BILITOT 0.5 0.9  PROT 6.5 7.4  ALBUMIN 3.7 3.8   Recent Labs  Lab  04/24/23 1756 04/26/23 1914  LIPASE 42 40   No results for input(s): "AMMONIA" in the last 168 hours. Coagulation profile Recent Labs  Lab 04/26/23 1914 04/27/23 0602 04/28/23 0610 04/29/23 0548  INR 2.8* 2.9* 3.3* 3.2*    CBC: Recent Labs  Lab 04/24/23 1756 04/26/23 1914 04/28/23 0610 04/29/23 0548  WBC 5.3 5.2 5.6 4.5  NEUTROABS 3.3  --   --   --   HGB 12.2 14.6 13.8 13.5  HCT 38.3 43.8 42.2 40.9  MCV 92.7 90.5 93.6 90.5  PLT 118* 135* 120* 117*   Cardiac Enzymes: No results for input(s): "CKTOTAL", "CKMB", "CKMBINDEX", "TROPONINI" in the last 168 hours. BNP: Invalid input(s): "POCBNP" CBG: Recent Labs  Lab 04/28/23 1625 04/28/23 2059 04/29/23 0718 04/29/23 1209 04/29/23 1737  GLUCAP 215* 231* 193* 187* 300*   D-Dimer No results for input(s): "DDIMER" in the last 72 hours. Hgb A1c No results for input(s): "HGBA1C" in the last 72 hours. Lipid Profile No results for input(s): "CHOL", "HDL", "LDLCALC", "TRIG", "CHOLHDL", "LDLDIRECT" in the last 72 hours. Thyroid function studies No results for input(s): "TSH", "T4TOTAL", "T3FREE", "THYROIDAB" in the last 72 hours.  Invalid input(s): "FREET3" Anemia work up No results for input(s): "VITAMINB12", "  FOLATE", "FERRITIN", "TIBC", "IRON", "RETICCTPCT" in the last 72 hours. Microbiology No results found for this or any previous visit (from the past 240 hour(s)).    Studies:  No results found.  Assessment: 62 y.o. female   Pancreatic mass and liver lesions, highly suspicious for metastatic pancreatic cancer Worsening abdominal pain, secondary to #1 Moderate protein and calorie malnutrition, anorexia Type 2 diabetes with neuropathy Atrial fibrillation, CHF CKD stage 3a COPD Anxiety and depression    Plan:  -Due to the persistent elevated INR, liver biopsy has not been able to performed by IR.  I spoke with Dr. Idelle Leech regarding FFP to reverse her INR.  It was ordered today, but there were some delay  and biopsy was not able to be done -Appreciate palliative care's assistance to manage her pain and other symptoms. -If INR still high tomorrow morning, I will recommend vitamin K to reverse it -Due to the holiday weekend, her biopsy will be likely next Tuesday, she also need port placement and celiac nerve block -If her pain and other symptoms of very well-controlled over the weekend, I am okay for her to be discharged (she has some reservation on that) and scheduled her procedures after discharge.  -I will f/u next Tuesday if she is still in hospital.    Malachy Mood, MD 04/29/2023  6:21 PM

## 2023-04-29 NOTE — TOC Initial Note (Signed)
Transition of Care [facility]) - Initial/Assessment Note    Patient Details  Name: [name] MRN: [id number] Date of Birth: January 15, 1961  Transition of Care [facility]) CM/SW Contact:    Coralyn Helling, LCSW Phone Number: 04/29/2023, 10:48 AM  Clinical Narrative:    [facility] consulted for HH/DME. TOC met with patient at bedside. Patient is pleasant and engaged. Patient goal is to return home with home health. Patient reports she has had difficulty bathing. She reports she is able to "wash up" but would need help getting in and out of the shower/tub. Patient reports increase in pain has limited her ability to complete ADLs. Patient reports using a cane, walker and wheel chair "depending on the day and level of pain." Patient drives to get medications. Patient is hoping she can get a home health aid with HH.  TOC explained HH process and need for recommendations. Patient has medicaid. TOC discussed patient contacting medicaid, to see if they can provide additional services at home.        Plan TBD: HH vs [place] w/ self care Palliative following PT eval pending.          Expected Discharge Plan: Home w Home Health Services Barriers to Discharge: Continued Medical Work up   Patient Goals and CMS Choice Patient states their goals for this hospitalization and ongoing recovery are:: Go home and get better   Choice offered to / list presented to : NA      Expected Discharge Plan and Services In-house Referral: NA Discharge Planning Services: NA Post Acute Care Choice: Home Health Living arrangements for the past 2 months: Apartment                 DME Arranged: N/A DME Agency: NA       HH Arranged: NA HH Agency: NA        Prior Living Arrangements/Services Living arrangements for the past 2 months: Apartment Lives with:: Self Patient language and need for interpreter reviewed:: Yes Do you feel safe going back to the place where you live?: Yes      Need for Family Participation in  Patient Care: Yes (Comment) Care giver support system in place?: No (comment) Current home services: DME Criminal Activity/Legal Involvement Pertinent to Current Situation/Hospitalization: No - Comment as needed  Activities of Daily Living Home Assistive Devices/Equipment: Eyeglasses, Cane (specify quad or straight), Wheelchair, Environmental consultant (specify type), Dentures (specify type) ADL Screening (condition at time of admission) Patient's cognitive ability adequate to safely complete daily activities?: Yes Is the patient deaf or have difficulty hearing?: No Does the patient have difficulty seeing, even when wearing glasses/contacts?: No Does the patient have difficulty concentrating, remembering, or making decisions?: No Patient able to express need for assistance with ADLs?: Yes Does the patient have difficulty dressing or bathing?: Yes Independently performs ADLs?: Yes (appropriate for developmental age) Does the patient have difficulty walking or climbing stairs?: Yes Weakness of Legs: Both Weakness of Arms/Hands: None  Permission Sought/Granted Permission sought to share information with : Family Supports Permission granted to share information with : No              Emotional Assessment Appearance:: Appears stated age Attitude/Demeanor/Rapport: Engaged Affect (typically observed): Accepting Orientation: : Oriented to Self, Oriented to Place, Oriented to  Time, Oriented to Situation Alcohol / Substance Use: Not Applicable Psych Involvement: No (comment)  Admission diagnosis:  Pancreatic cancer metastasized to liver (HCC) [C25.9, C78.7] Intractable pain [R52] Pain of upper abdomen [R10.10] Essential hypertension [I10]  Patient Active Problem List   Diagnosis Date Noted   Pain of upper abdomen 04/28/2023   High risk medication use 04/28/2023   Need for emotional support 04/28/2023   Constipation 04/28/2023   Pancreatic cancer metastasized to liver (HCC) 04/28/2023   Palliative  care encounter 04/28/2023   Essential hypertension 04/27/2023   Thrombocytopenia (HCC) 04/27/2023   Intractable pain 04/26/2023   Pancreatic cancer (HCC) 04/12/2023   Pancreatic mass 04/04/2023   Mass of pancreas 04/03/2023   Chronic kidney disease, stage 3a (HCC) 04/03/2023   Pulmonary nodules 04/03/2023   Mixed hyperlipidemia 02/28/2023   Rheumatic disease of heart valve 11/19/2022   COPD (chronic obstructive pulmonary disease) (HCC) 08/05/2022   Anxiety and depression 08/05/2022   QT prolongation 08/05/2022   Benign neoplasm of ascending colon    AVM (arteriovenous malformation) of colon without hemorrhage    Benign neoplasm of cecum    Benign neoplasm of transverse colon    Normocytic anemia 07/23/2022   Pain due to onychomycosis of toenails of both feet 06/23/2022   Encounter for therapeutic drug monitoring 06/11/2022   Hypomagnesemia 05/24/2022   Long term current use of anticoagulant therapy 05/17/2022   S/P MVR/TVR  04/19/22 05/17/2022   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 05/17/2022   PAF (paroxysmal atrial fibrillation) (HCC)    Pain of left hand 01/01/2021   Nasal vestibulitis 04/25/2020   FH: CHF (congestive heart failure) 04/05/2020   Acquired hallux interphalangeus 02/26/2020   Hallux rigidus 02/26/2020   Retained orthopedic hardware 02/26/2020   Mechanical complication associated with orthopedic device (HCC) 08/31/2019   Hypercholesterolemia 08/17/2019   Lateral epicondylitis of right elbow 03/31/2018   Arthritis of knee 03/09/2018   Arthritis of foot, degenerative 02/28/2018   Hallux valgus (acquired), left foot 02/28/2018   Long term current use of oral hypoglycemic drug 01/16/2018   Pharyngeal dysphagia 12/21/2017   Left thyroid nodule 12/21/2017   History of colon polyps 09/21/2017   History of atrial fibrillation 08/23/2017   GERD (gastroesophageal reflux disease) 08/23/2017   Presbyopia of both eyes 07/15/2017   Nuclear sclerotic cataract of both  eyes 07/15/2017   Dry eye syndrome of both lacrimal glands 07/15/2017   Acquired trigger finger of left ring finger 05/04/2017   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 01/17/2017   Paroxysmal atrial fibrillation (HCC) 12/30/2016   Osteoarthritis 12/30/2016   NSAID long-term use 12/30/2016   DDD (degenerative disc disease), lumbar 11/26/2015   Facet syndrome, lumbar 11/26/2015   Greater trochanteric bursitis 11/26/2015   Diabetic neuropathy (HCC) 11/26/2015   Insomnia 11/11/2015   PTSD (post-traumatic stress disorder) 11/11/2015   GAD (generalized anxiety disorder) 11/11/2015   Severe episode of recurrent major depressive disorder, without psychotic features (HCC) 11/11/2015   Tobacco use 06/27/2012   Sacroiliac joint dysfunction 05/18/2012   Right low back pain 04/19/2012   Cervical neck pain with evidence of disc disease 02/11/2012   Trochanteric bursitis of right hip 02/11/2012   Hematochezia 04/29/2011   Plantar fasciitis 03/11/2011   Acute cystitis 06/19/2010   INSOMNIA 01/18/2008   Chronic pain syndrome 12/15/2007   DIABETIC PERIPHERAL NEUROPATHY 05/08/2007   HEPATITIS C 02/27/2007   Type 2 diabetes mellitus with hyperlipidemia (HCC) 02/27/2007   Hypertension associated with diabetes (HCC) 02/27/2007   PCP:  Chrys Racer, MD Pharmacy:   [facility] [facility] [address] - [place], [place] - [address] [address] [facility] [address] [place] [address] Phone: [phone] Fax: [phone]  [facility] - [place], [place] - [address] [place] [address] Phone: [phone] Fax: [phone]  [id number] - [place],  - [address] AT [facility] [address]  [place] [address] Phone: [phone] Fax: [phone]     Social Determinants of Health (SDOH) Social History: SDOH Screenings   Food Insecurity: No Food Insecurity (04/27/2023)  Housing: Low Risk  (04/27/2023)   Transportation Needs: No Transportation Needs (04/27/2023)  Utilities: Not At Risk (04/27/2023)  Recent Concern: Utilities - At Risk (04/04/2023)  Tobacco Use: High Risk (04/27/2023)   SDOH Interventions:     Readmission Risk Interventions    04/08/2023    2:15 PM 08/06/2022    2:13 PM  Readmission Risk Prevention Plan  Transportation Screening Complete Complete  Medication Review [employer])  Complete  PCP or Specialist appointment within 3-5 days of discharge  Complete  HRI or Home Care Consult Complete Complete  SW Recovery Care/Counseling Consult Complete Complete  Palliative Care Screening Not Applicable Not Applicable  Skilled Nursing Facility Not Applicable Not Applicable

## 2023-04-29 NOTE — Progress Notes (Signed)
Daily Progress Note   Patient Name: [name]       Date: 04/29/2023 DOB: 11-22-1961  Age: 62 y.o. MRN#: [id number] Attending Physician: Willeen Niece, MD Primary Care Physician: Chrys Racer, MD Admit Date: 04/26/2023 Length of Stay: 1 day  Reason for Consultation/Follow-up: Establishing goals of care and Symptom Management  Subjective:   CC: Patient noting still no BM and pain present. Following up regarding complex medical decision making and symptom management.   Subjective:  EMR prior to presenting to bedside.  At time of EMR review patient has received oxycodone 20 mg x 4 doses.  Patient has also received IV Dilaudid 0.5 mg x 2 doses for breakthrough pain after oral medications. Patient continues to receive MS Contin 30 mg every 12 hours scheduled.  Patient was started on dexamethasone 4 mg daily yesterday.    Presented to bedside to check on patient.  Patient notes pain still uncontrolled on increased dose of oxycodone.  Patient having pain in epigastric region as well as left lower quadrant.  Discussed based on patient's OME requirements over the past 24 hours, recommend increasing long-acting medication.  After discussion patient noting agreeing with taking long-acting opioids 3 times a day.  Will schedule patient on MS Contin 30 mg every 8 hours during the day.  Patient noted that when she takes the oxycodone, it does help with her pain though will wear off quickly.  Discussed changing frequency of oxycodone to 20 mg every 3 hours as needed.  Also discussed with patient she has IV Dilaudid available for breakthrough pain after oral medications. Also discussed that the patient has not had a bowel movement yet.  Discussed that can be contributing to her left lower quadrant pain.  Patient willing to have suppository today to assist with management.  All questions answered at that time.  Noted palliative medicine team will continue to follow along with patient's medical  journey.  Discussed care with bedside RN after visit with patient.  Review of Systems Abdominal pain, constipation Objective:   Vital Signs:  BP (!) 154/88 (BP Location: Right Arm) Comment: RN aware  Pulse 66   Temp 98 F (36.7 C) (Oral)   Resp 18   Ht 5\' 11"  (1.803 m)   Wt 80.7 kg   LMP 12/06/1976   SpO2 96%   BMI 24.83 kg/m   Physical Exam: General: NAD, alert, laying in bed, chronically ill-appearing, grimacing at times Eyes: No drainage noted HENT: moist mucous membranes Cardiovascular: RRR Respiratory: no increased work of breathing noted, not in respiratory distress Abdomen: distended Skin: no rashes or lesions on visible skin Neuro: A&Ox4, following commands easily Psych: appropriately answers all questions   Imaging:  I personally reviewed recent imaging.   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  Patient has already undergone EUS with GI on 5/2 though pathology unfortunately was not nonconclusive.  Oncology following for pathology.  IR has been consulted to obtain another liver  biopsy.  Palliative medicine team consulted to assist with complex medical decision making and pain management.   Recommendations/Plan: # Complex medical decision making/goals of care:  -Patient continuing with current cancer directed workup and therapies.  Did not discuss in detail patient's  planning for medical therapies moving forward as today continued to be  focused on symptom management.                 -  Code Status: Full Code    # Symptom management                -Pain, acute in the setting of recently diagnosed likely pancreatic cancer with metastatic disease to liver                               -Increase MS Contin 30 mg every 8 hours scheduled.  Will give one time dose now to get to 3 times a day dosing and then will continue q8hrs from thereon.                                -Increase p.o. oxycodone to 20 mg every 3 hours as needed.                               -Increase IV Dilaudid to 0.5 mg q2hrs for breakthrough pain after oral medication                               -Continue dexamethasone 4 mg daily to assist with inflammatory aspects of visceral pain from liver.  Will provide IV dexamethasone today and started on oral dexamethasone tomorrow.                               -Already discussed with IR evaluation for possible celiac plexus nerve block to assist with multimodal pain management.                  -Constipation Patient notes no bowel movement in multiple days.                               -Continue senna 2 tabs today along with MiraLAX 17 g daily                               -Start suppository once today   # Psycho-social/Spiritual Support:  - Support System: sister, brother, niece as per EMR review   # Discharge Planning:  TBD                -Will need outpatient PMT referral to [facility].   Discussed with: patient, bedside RN  Thank you for allowing the palliative care team to participate in the care [name].  Alvester Morin, DO Palliative Care Provider PMT # [phone]  If patient remains symptomatic despite maximum doses, please call PMT at [phone] between 0700 and 1900. Outside of these hours, please call attending, as PMT does not have night coverage.  *Please note that this is a verbal dictation therefore any spelling or  grammatical errors are due to the "[employer]" system interpretation.

## 2023-04-29 NOTE — Progress Notes (Addendum)
Patient ID: [name], female   DOB: 1961/02/18, 62 y.o.   MRN: [id number] PT/INR on pt remains elevated today at 32.7/3.2. All IR procedures remain ON HOLD until safe parameters are met (INR less than 1.8). Contact IR attending Dr. Milford Cage at [phone] or pager [phone] with any questions .

## 2023-04-29 NOTE — Progress Notes (Signed)
PROGRESS NOTE    [name]  ZOX:[id number] DOB: May 08, 1961 DOA: 04/26/2023  PCP: Chrys Racer, MD  Brief Narrative:  This 62 years old female with PMH significant for rheumatic heart disease s/p MV and TV repair in 04/2022, history of pericardial effusion / tamponade s/p pericardiocentesis in June 2023, PAF on Coumadin, CKD stage IIIa, COPD, type 2 diabetes, hypertension, tobacco use presented with worsening abdominal pain. 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 Partial encasement of the left side of splenic artery. Splenic vein appears completely occluded. Had EUS with GI on 5//01/2020 with pancreatic body/tail mass biopsied but pathology unfortunately was non-diagnosed. Oncology has recommended liver biopsy with IR which she is still awaiting.  Patient reports worsening symptoms,  describes pain mostly in the epigastric region.  Patient has been taking MS Contin and oxycodone IR with partial pain relief.,  INR is 2.8.  Patient is admitted for further evaluation.  She is going to get liver biopsy once INR is below 1.5. She is admitted for further evaluation.  Assessment & Plan:   Principal Problem:   Intractable pain Active Problems:   Pancreatic cancer (HCC)   Paroxysmal atrial fibrillation (HCC)   COPD (chronic obstructive pulmonary disease) (HCC)   Type 2 diabetes mellitus with hyperlipidemia (HCC)   Hypertension associated with diabetes (HCC)   Chronic kidney disease, stage 3a (HCC)   Thrombocytopenia (HCC)   Pain of upper abdomen   High risk medication use   Need for emotional support   Constipation   Pancreatic cancer metastasized to liver [facility])   Palliative care encounter  Intractable pain: -Likely in the setting of recently diagnosed metastatic pancreatic cancer. -Continue PRN IV opioids for  pain control -Continue home MS Contin 30mg  BID -Continue home oxycodone IR 15mg  q6hr prn  for breakthrough pain -will need adjustments to pain medications prior to discharge. -May also benefit from palliative consult as her cancer appears aggressive. -Patient is going to get celiac plexus nerve block for pain control. -INR is still elevated,  FFP ordered to reverse coumadin effect.   Pancreatic cancer [facility]): -Patient was recently diagnosed in early May with pancreatic adenocarinoma with possible liver and nodal metastases. -She has had EUS with GI on 5//01/2020 with pancreatic body / tail mass biopsied but pathology unfortunately was non-diagnosed.  -Oncology has recommended liver biopsy with IR which she is still awaiting. -Plan: Follow biopsy results will most likely be palliative chemotherapy -Following with oncologist Dr. Mosetta Putt. -INR should be 1.5 or less before liver biopsy and potential celiac block can be done. -INR is still elevated,  FFP ordered to reverse coumadin effect.  COPD : -Not in any acute exacerbation.   Paroxysmal atrial fibrillation (HCC) - Heart rate well controlled. - Hold warfarin as patient is going to have liver biopsy. - Continue home beta-blocker and diltiazem.   Type 2 diabetes mellitus with hyperlipidemia (HCC) -Continue low dose SSI -Continue statin. -Start Semglee 8 units daily   Thrombocytopenia (HCC) -plt of 135 -continue to follow   Chronic kidney disease, stage 3a (HCC) -Creatinine is stable and at baseline.   Hypertension associated with diabetes (HCC) -Elevated due to pain and missing medications for a few days. -Continue home antihypertensives. -Added hydralazine 25 mg 3 times daily for better blood pressure control    DVT prophylaxis: SCDs Code Status: Full  code Family Communication: No family at bed side. Disposition Plan:    Status is: Observation The patient remains OBS appropriate and will d/c before 2 midnights.  Patient  admitted for intractable abdominal pain in the setting of pancreatic cancer.   Oncology is consulted, Patient is scheduled to have liver biopsy once INR below 1.5   Consultants:  Oncology IR  Procedures:Liver biopsy  Antimicrobials: Anti-infectives (From admission, onward)    Start     Dose/Rate Route Frequency Ordered Stop   04/27/23 1200  fluconazole (DIFLUCAN) tablet 100 mg        100 mg Oral Daily 04/27/23 1027 04/28/23 1044      Subjective: Patient was seen and examined at bedside.  Overnight events noted.   Patient appears very deconditioned, cachectic, sick looking.  She is still reports persistent pain. She denies any nausea and vomiting.  She is awaiting liver biopsy today.  Objective: Vitals:   04/29/23 0539 04/29/23 1200 04/29/23 1228 04/29/23 1247  BP: (!) 164/99 (!) 147/99 (!) 147/99 129/85  Pulse: 67 76 76 60  Resp: 16 16 16 16   Temp: (!) 97.5 F (36.4 C) 98.2 F (36.8 C) 98.2 F (36.8 C) 97.7 F (36.5 C)  TempSrc: Oral Oral Oral Oral  SpO2: 100% 96% 96% 97%  Weight:      Height:        Intake/Output Summary (Last 24 hours) at 04/29/2023 1346 Last data filed at 04/29/2023 0550 Gross per 24 hour  Intake --  Output 300 ml  Net -300 ml   Filed Weights   04/26/23 1819  Weight: 80.7 kg    Examination:  General exam: Appears cachectic, comfortable, sick looking, very deconditioned. Respiratory system: CTA bilaterally,  Respiratory effort normal.  RR 13 Cardiovascular system: S1 & S2 heard, RRR. No JVD, murmurs, rubs, gallops or clicks. Gastrointestinal system: Abdomen is soft, diffusely tender , non distended, BS+ Central nervous system: Alert and oriented x 3. No focal neurological deficits. Extremities: No edema, no cyanosis, no clubbing Skin: No rashes, lesions or ulcers Psychiatry: Judgement and insight appear normal. Mood & affect appropriate.     Data Reviewed: I have personally reviewed following labs and imaging studies  CBC: Recent  Labs  Lab 04/24/23 1756 04/26/23 1914 04/28/23 0610 04/29/23 0548  WBC 5.3 5.2 5.6 4.5  NEUTROABS 3.3  --   --   --   HGB 12.2 14.6 13.8 13.5  HCT 38.3 43.8 42.2 40.9  MCV 92.7 90.5 93.6 90.5  PLT 118* 135* 120* 117*   Basic Metabolic Panel: Recent Labs  Lab 04/24/23 1756 04/26/23 1914 04/27/23 0602 04/28/23 0610 04/29/23 0548  NA 135 135 135 133* 132*  K 4.3 3.9 4.1 4.0 4.3  CL 98 100 101 100 99  CO2 26 22 24 22 25   GLUCOSE 304* 146* 129* 149* 206*  BUN 16 24* 24* 25* 27*  CREATININE 1.29* 0.96 0.96 0.86 0.95  CALCIUM 8.7* 8.8* 8.7* 8.4* 8.7*  MG  --   --   --  1.7 1.9  PHOS  --   --   --  3.4 3.9   GFR: Estimated Creatinine Clearance: 67.7 mL/min (by C-G formula based on SCr of 0.95 mg/dL). Liver Function Tests: Recent Labs  Lab 04/24/23 1756 04/26/23 1914  AST 19 20  ALT 19 18  ALKPHOS 76 87  BILITOT 0.5 0.9  PROT 6.5 7.4  ALBUMIN 3.7 3.8   Recent Labs  Lab 04/24/23 1756 04/26/23 1914  LIPASE 42  40   No results for input(s): "AMMONIA" in the last 168 hours. Coagulation Profile: Recent Labs  Lab 04/26/23 1914 04/27/23 0602 04/28/23 0610 04/29/23 0548  INR 2.8* 2.9* 3.3* 3.2*   Cardiac Enzymes: No results for input(s): "CKTOTAL", "CKMB", "CKMBINDEX", "TROPONINI" in the last 168 hours. BNP (last 3 results) Recent Labs    08/24/22 1555 12/01/22 1629  PROBNP 2,927* 2,487*   HbA1C: No results for input(s): "HGBA1C" in the last 72 hours. CBG: Recent Labs  Lab 04/28/23 1218 04/28/23 1625 04/28/23 2059 04/29/23 0718 04/29/23 1209  GLUCAP 79 215* 231* 193* 187*   Lipid Profile: No results for input(s): "CHOL", "HDL", "LDLCALC", "TRIG", "CHOLHDL", "LDLDIRECT" in the last 72 hours. Thyroid Function Tests: No results for input(s): "TSH", "T4TOTAL", "FREET4", "T3FREE", "THYROIDAB" in the last 72 hours. Anemia Panel: No results for input(s): "VITAMINB12", "FOLATE", "FERRITIN", "TIBC", "IRON", "RETICCTPCT" in the last 72 hours. Sepsis  Labs: No results for input(s): "PROCALCITON", "LATICACIDVEN" in the last 168 hours.  No results found for this or any previous visit (from the past 240 hour(s)).   Radiology Studies: No results found.  Scheduled Meds:  sodium chloride   Intravenous Once   ARIPiprazole  2.5 mg Oral Daily   atorvastatin  20 mg Oral Daily   buPROPion  300 mg Oral q AM   dexamethasone  4 mg Oral Daily   furosemide  40 mg Oral Daily   gabapentin  300 mg Oral TID   hydrALAZINE  25 mg Oral Q8H    HYDROmorphone (DILAUDID) injection  0.5 mg Intravenous Once   insulin aspart  0-9 Units Subcutaneous TID WC   metoprolol tartrate  25 mg Oral BID   morphine  30 mg Oral Q12H   nicotine  21 mg Transdermal Daily   pantoprazole  40 mg Oral Daily   polyethylene glycol  17 g Oral Daily   senna  2 tablet Oral Daily   vortioxetine HBr  10 mg Oral Daily   Continuous Infusions:   LOS: 1 day    Time spent: 35 mins    Willeen Niece, MD [facility]   If 7PM-7AM, please contact night-coverage

## 2023-04-30 DIAGNOSIS — R1010 Upper abdominal pain, unspecified: Secondary | ICD-10-CM | POA: Diagnosis not present

## 2023-04-30 DIAGNOSIS — Z79899 Other long term (current) drug therapy: Secondary | ICD-10-CM

## 2023-04-30 DIAGNOSIS — Z515 Encounter for palliative care: Secondary | ICD-10-CM | POA: Diagnosis not present

## 2023-04-30 DIAGNOSIS — K5900 Constipation, unspecified: Secondary | ICD-10-CM | POA: Diagnosis not present

## 2023-04-30 DIAGNOSIS — R52 Pain, unspecified: Secondary | ICD-10-CM | POA: Diagnosis not present

## 2023-04-30 DIAGNOSIS — C259 Malignant neoplasm of pancreas, unspecified: Secondary | ICD-10-CM | POA: Diagnosis not present

## 2023-04-30 LAB — GLUCOSE, CAPILLARY
Glucose-Capillary: 186 mg/dL — ABNORMAL HIGH (ref 70–99)
Glucose-Capillary: 191 mg/dL — ABNORMAL HIGH (ref 70–99)
Glucose-Capillary: 311 mg/dL — ABNORMAL HIGH (ref 70–99)
Glucose-Capillary: 242 mg/dL — ABNORMAL HIGH (ref 70–99)

## 2023-04-30 LAB — PROTIME-INR
INR: 2.2 — ABNORMAL HIGH (ref 0.8–1.2)
Prothrombin Time: 24.7 seconds — ABNORMAL HIGH (ref 11.4–15.2)

## 2023-04-30 MED ORDER — INSULIN ASPART 100 UNIT/ML IJ SOLN
2.0000 [IU] | Freq: Once | INTRAMUSCULAR | Status: AC
  Administered 2023-04-30: 2 [IU] via SUBCUTANEOUS

## 2023-04-30 MED ORDER — DIPHENHYDRAMINE HCL 25 MG PO CAPS
25.0000 mg | ORAL_CAPSULE | Freq: Four times a day (QID) | ORAL | Status: DC | PRN
  Administered 2023-04-30 (×2): 25 mg via ORAL
  Filled 2023-04-30 (×2): qty 1

## 2023-04-30 MED ORDER — VITAMIN K1 10 MG/ML IJ SOLN
5.0000 mg | Freq: Once | INTRAVENOUS | Status: AC
  Administered 2023-04-30: 5 mg via INTRAVENOUS
  Filled 2023-04-30: qty 0.5

## 2023-04-30 MED ORDER — CALAMINE EX LOTN
1.0000 | TOPICAL_LOTION | Freq: Four times a day (QID) | CUTANEOUS | Status: DC
  Administered 2023-04-30 – 2023-05-05 (×11): 1 via TOPICAL
  Filled 2023-04-30: qty 177

## 2023-04-30 NOTE — Progress Notes (Signed)
Daily Progress Note   Patient Name: [name]       Date: 04/30/2023 DOB: [birth year]  Age: 62 y.o. MRN#: [id number] Attending Physician: Willeen Niece, MD Primary Care Physician: Chrys Racer, MD Admit Date: 04/26/2023 Length of Stay: 2 days  Reason for Consultation/Follow-up: Establishing goals of care and Symptom Management  Subjective:   CC: Patient reports feeling pain improved at this time.  Following up regarding complex medical decision making and symptom management.   Subjective:  EMR prior to presenting to bedside.  At time of EMR review patient has received oxycodone 20 mg x 4 doses as well as IV Dilaudid 0.5 mg x 3 doses.  Patient was increased to MS Contin 30 mg every 8 hours yesterday afternoon.  Discussed care with bedside RN for updates prior to seeing patient.  Scented to bedside to check on patient's symptom burden at this time.  Patient reports feeling that her pain is overall improved today and she feels the medications are working well for her.  Patient still feels the oxycodone 20 mg every 3 hours as needed assist with pain management when she needs it.  Patient is taking the IV Dilaudid as breakthrough if needed.  Noted had just started on increased dose of long-acting yesterday so hopefully this will continue to improve moving forward.  Did note to patient that if she feels that the as needed oxycodone is not working at some point, can make further adjustments ordered perform opioid rotation to medication like oral Dilaudid.  Patient acknowledging this and noting wanting to stay with her current pain regimen.  Patient also noted she had multiple bowel movements overnight so that pain has improved.  Discussed importance of remaining on bowel regimen while taking opioids.  Patient about to receive vitamin K for management of INR.  Patient notes plan is for biopsy and nerve block early next week and hopefully she can had out by Wednesday.  Acknowledges and  expressed hope this will occur. All questions answered at that time.  Thanked patient for allowing me to visit with her today.  Review of Systems Abdominal pain improved, constipation improved Objective:   Vital Signs:  BP (!) 160/99 (BP Location: Right Arm)   Pulse 62   Temp 97.9 F (36.6 C) (Oral)   Resp 16   Ht 5\' 11"  (1.803 m)   Wt 80.7 kg   LMP 12/06/1976   SpO2 97%   BMI 24.83 kg/m   Physical Exam: General: NAD, alert, laying in bed, chronically ill-appearing, pleasant Eyes: No drainage noted HENT: moist mucous membranes Cardiovascular: RRR Respiratory: no increased work of breathing noted, not in respiratory distress Abdomen: distended Skin: no rashes or lesions on visible skin Neuro: A&Ox4, following commands easily Psych: appropriately answers all questions   Imaging:  I personally reviewed recent imaging.   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  Patient has already undergone EUS with GI on 5/2 though pathology unfortunately was  not nonconclusive.  Oncology following for pathology.  IR has been consulted to obtain another liver biopsy.  Palliative medicine team consulted to assist with complex medical decision making and pain management.   Recommendations/Plan: # Complex medical decision making/goals of care:  -Patient continuing with current cancer directed workup  and therapies.                 -  Code Status: Full Code    # Symptom management                -Pain, acute in the setting of recently diagnosed likely pancreatic cancer with metastatic disease to liver                               -Continue MS Contin 30 mg every 8 hours scheduled.  Will give one time dose now to get to 3 times a day dosing and then will continue q8hrs from thereon.                                -Continue p.o. oxycodone to 20 mg every 3 hours as needed.                               -Continue IV Dilaudid to 0.5 mg q2hrs for breakthrough pain after oral medication                               -Continue dexamethasone 4 mg daily to assist with inflammatory aspects of visceral pain from liver.  Will provide IV dexamethasone today and started on oral dexamethasone tomorrow.                               -Already discussed with IR evaluation for possible celiac plexus nerve block to assist with multimodal pain management.                  -Constipation Patient notes bowel movement overnight on 5/24 after receiving suppository.  Pain from constipation currently improved.                               -Continue senna 2 tabs today along with MiraLAX 17 g daily   # Psycho-social/Spiritual Support:  - Support System: sister, brother, niece as per EMR review   # Discharge Planning:  TBD                -Will need outpatient PMT referral to [facility].   Discussed with: patient, bedside RN  Thank you for allowing the palliative care team to participate in the care [name].  Alvester Morin, DO Palliative Care Provider PMT # [phone]  If patient remains symptomatic despite maximum doses, please call PMT at [phone] between 0700 and 1900. Outside of these hours, please call attending, as PMT does not have night coverage.  *Please note that this is a verbal dictation therefore any spelling or grammatical errors are due to the "[employer]agon [employer] One" system interpretation.

## 2023-04-30 NOTE — Progress Notes (Signed)
PROGRESS NOTE    [name]  ZOX:[id number] DOB: February 05, 1961 DOA: 04/26/2023  PCP: Chrys Racer, MD  Brief Narrative:  This 62 years old female with PMH significant for rheumatic heart disease s/p MV and TV repair in 04/2022, history of pericardial effusion / tamponade s/p pericardiocentesis in June 2023, PAF on Coumadin, CKD stage IIIa, COPD, type 2 diabetes, hypertension, tobacco use presented with worsening abdominal pain. 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 Partial encasement of the left side of splenic artery. Splenic vein appears completely occluded. Had EUS with GI on 5//01/2020 with pancreatic body/tail mass biopsied but pathology unfortunately was non-diagnosed. Oncology has recommended liver biopsy with IR which she is still awaiting.  Patient reports worsening symptoms,  describes pain mostly in the epigastric region.  Patient has been taking MS Contin and oxycodone IR with partial pain relief.,  INR is 2.8.  Patient is admitted for further evaluation.  She is going to get liver biopsy once INR is below 1.5. She is admitted for further evaluation.  Assessment & Plan:   Principal Problem:   Intractable pain Active Problems:   Pancreatic cancer (HCC)   Paroxysmal atrial fibrillation (HCC)   COPD (chronic obstructive pulmonary disease) (HCC)   Type 2 diabetes mellitus with hyperlipidemia (HCC)   Hypertension associated with diabetes (HCC)   Chronic kidney disease, stage 3a (HCC)   Thrombocytopenia (HCC)   Pain of upper abdomen   High risk medication use   Need for emotional support   Constipation   Pancreatic cancer metastasized to liver [facility])   Palliative care encounter  Intractable pain: -Likely in the setting of recently diagnosed metastatic pancreatic cancer. -Continue PRN IV opioids for  pain control. -Continue home MS Contin 30mg  BID. -Continue home oxycodone IR 15mg  q6hr prn  for breakthrough pain. -will need adjustments to pain medications prior to her discharge. -May also benefit from palliative consult as her cancer appears aggressive. -Patient is going to get celiac plexus nerve block for pain control. -INR is still elevated,  FFP ordered to reverse coumadin effect. -INR still remains 2.2, Vitamin K 5 mg given.   Pancreatic cancer [facility]): -Patient was recently diagnosed in early May with pancreatic adenocarinoma with possible liver and nodal metastases. -She has had EUS with GI on 5//01/2020 with pancreatic body / tail mass biopsied but pathology unfortunately was non-diagnosed.  -Oncology has recommended liver biopsy with IR which she is still awaiting. -Plan: Follow biopsy results , then will most likely be palliative chemotherapy -Following with oncologist Dr. Mosetta Putt. -INR should be 1.5 or less before liver biopsy and potential celiac block can be done. -INR is still elevated,  FFP ordered to reverse coumadin effect. -INR still remains 2.2, Vitamin K 5 mg given.  COPD : -Not in any acute exacerbation.   Paroxysmal atrial fibrillation (HCC) - Heart rate is well controlled. - Hold warfarin as patient is going to have liver biopsy. - Continue home beta-blocker and diltiazem.   Type 2 diabetes mellitus with hyperlipidemia (HCC) -Continue low dose SSI -Continue statin. -Continue Semglee 8 units daily   Thrombocytopenia (HCC) -plt of 135 -continue to follow.   Chronic kidney disease, stage 3a (HCC) -Creatinine is stable and at baseline.   Hypertension associated with diabetes (HCC) -Elevated due to pain and missing medications for a few days. -Continue home  antihypertensives. -Added hydralazine 25 mg 3 times daily for better blood pressure control    DVT prophylaxis: SCDs Code Status: Full code Family Communication: No family at bed side. Disposition  Plan:    Status is: Observation The patient remains OBS appropriate and will d/c before 2 midnights.  Patient admitted for intractable abdominal pain in the setting of pancreatic cancer.   Oncology is consulted, Patient is scheduled to have liver biopsy once INR below 1.5 Liver biopsy be tentatively scheduled on Tuesday. Patient want to have all the workup completed before she wants to discharge.   Consultants:  Oncology IR  Procedures:Liver biopsy  Antimicrobials: Anti-infectives (From admission, onward)    Start     Dose/Rate Route Frequency Ordered Stop   04/27/23 1200  fluconazole (DIFLUCAN) tablet 100 mg        100 mg Oral Daily 04/27/23 1027 04/28/23 1044      Subjective: Patient was seen and examined at bedside.  Overnight events noted.   Patient appears very deconditioned, cachectic, sick looking.   She still reports having persistent pain but reasonably controlled. She denies any nausea and vomiting.  She is awaiting liver biopsy today.  Objective: Vitals:   04/29/23 1505 04/29/23 1615 04/29/23 2045 04/30/23 0544  BP: (!) 154/88 (!) 142/85 (!) 151/101 (!) 160/99  Pulse: 66 68 75 62  Resp: 18 16 18 16   Temp: 98 F (36.7 C) 98.2 F (36.8 C) 97.7 F (36.5 C) 97.9 F (36.6 C)  TempSrc: Oral Oral Oral Oral  SpO2: 96% 98% 98% 97%  Weight:      Height:        Intake/Output Summary (Last 24 hours) at 04/30/2023 1145 Last data filed at 04/29/2023 1615 Gross per 24 hour  Intake 431.25 ml  Output --  Net 431.25 ml   Filed Weights   04/26/23 1819  Weight: 80.7 kg    Examination:  General exam: Appears cachectic, comfortable, sick looking, very deconditioned, not in any distress. Respiratory system: CTA bilaterally,  Respiratory effort normal.  RR 14 Cardiovascular system: S1 & S2 heard, RRR. No JVD, murmurs, rubs, gallops or clicks. Gastrointestinal system: Abdomen is soft, diffusely tender , non distended, BS+ Central nervous system: Alert and oriented x  3. No focal neurological deficits. Extremities: No edema, no cyanosis, no clubbing Skin: No rashes, lesions or ulcers Psychiatry: Judgement and insight appear normal. Mood & affect appropriate.     Data Reviewed: I have personally reviewed following labs and imaging studies  CBC: Recent Labs  Lab 04/24/23 1756 04/26/23 1914 04/28/23 0610 04/29/23 0548  WBC 5.3 5.2 5.6 4.5  NEUTROABS 3.3  --   --   --   HGB 12.2 14.6 13.8 13.5  HCT 38.3 43.8 42.2 40.9  MCV 92.7 90.5 93.6 90.5  PLT 118* 135* 120* 117*   Basic Metabolic Panel: Recent Labs  Lab 04/24/23 1756 04/26/23 1914 04/27/23 0602 04/28/23 0610 04/29/23 0548  NA 135 135 135 133* 132*  K 4.3 3.9 4.1 4.0 4.3  CL 98 100 101 100 99  CO2 26 22 24 22 25   GLUCOSE 304* 146* 129* 149* 206*  BUN 16 24* 24* 25* 27*  CREATININE 1.29* 0.96 0.96 0.86 0.95  CALCIUM 8.7* 8.8* 8.7* 8.4* 8.7*  MG  --   --   --  1.7 1.9  PHOS  --   --   --  3.4 3.9   GFR: Estimated Creatinine Clearance: 67.7 mL/min (by C-G formula based on SCr of  0.95 mg/dL). Liver Function Tests: Recent Labs  Lab 04/24/23 1756 04/26/23 1914  AST 19 20  ALT 19 18  ALKPHOS 76 87  BILITOT 0.5 0.9  PROT 6.5 7.4  ALBUMIN 3.7 3.8   Recent Labs  Lab 04/24/23 1756 04/26/23 1914  LIPASE 42 40   No results for input(s): "AMMONIA" in the last 168 hours. Coagulation Profile: Recent Labs  Lab 04/27/23 0602 04/28/23 0610 04/29/23 0548 04/29/23 1812 04/30/23 0706  INR 2.9* 3.3* 3.2* 2.0* 2.2*   Cardiac Enzymes: No results for input(s): "CKTOTAL", "CKMB", "CKMBINDEX", "TROPONINI" in the last 168 hours. BNP (last 3 results) Recent Labs    08/24/22 1555 12/01/22 1629  PROBNP 2,927* 2,487*   HbA1C: No results for input(s): "HGBA1C" in the last 72 hours. CBG: Recent Labs  Lab 04/29/23 0718 04/29/23 1209 04/29/23 1737 04/29/23 2056 04/30/23 0712  GLUCAP 193* 187* 300* 242* 186*   Lipid Profile: No results for input(s): "CHOL", "HDL",  "LDLCALC", "TRIG", "CHOLHDL", "LDLDIRECT" in the last 72 hours. Thyroid Function Tests: No results for input(s): "TSH", "T4TOTAL", "FREET4", "T3FREE", "THYROIDAB" in the last 72 hours. Anemia Panel: No results for input(s): "VITAMINB12", "FOLATE", "FERRITIN", "TIBC", "IRON", "RETICCTPCT" in the last 72 hours. Sepsis Labs: No results for input(s): "PROCALCITON", "LATICACIDVEN" in the last 168 hours.  No results found for this or any previous visit (from the past 240 hour(s)).   Radiology Studies: No results found.  Scheduled Meds:  sodium chloride   Intravenous Once   ARIPiprazole  2.5 mg Oral Daily   atorvastatin  20 mg Oral Daily   buPROPion  300 mg Oral q AM   dexamethasone  4 mg Oral Daily   furosemide  40 mg Oral Daily   gabapentin  300 mg Oral TID   hydrALAZINE  25 mg Oral Q8H   insulin aspart  0-9 Units Subcutaneous TID WC   metoprolol tartrate  25 mg Oral BID   morphine  30 mg Oral Q8H   nicotine  21 mg Transdermal Daily   pantoprazole  40 mg Oral Daily   polyethylene glycol  17 g Oral Daily   senna  2 tablet Oral Daily   vortioxetine HBr  10 mg Oral Daily   Continuous Infusions:   LOS: 2 days    Time spent: 35 mins    Willeen Niece, MD [facility]   If 7PM-7AM, please contact night-coverage

## 2023-05-01 DIAGNOSIS — C259 Malignant neoplasm of pancreas, unspecified: Secondary | ICD-10-CM | POA: Diagnosis not present

## 2023-05-01 DIAGNOSIS — L299 Pruritus, unspecified: Secondary | ICD-10-CM

## 2023-05-01 DIAGNOSIS — R1010 Upper abdominal pain, unspecified: Secondary | ICD-10-CM | POA: Diagnosis not present

## 2023-05-01 DIAGNOSIS — Z7189 Other specified counseling: Secondary | ICD-10-CM

## 2023-05-01 DIAGNOSIS — R52 Pain, unspecified: Secondary | ICD-10-CM | POA: Diagnosis not present

## 2023-05-01 DIAGNOSIS — K5900 Constipation, unspecified: Secondary | ICD-10-CM | POA: Diagnosis not present

## 2023-05-01 DIAGNOSIS — Z515 Encounter for palliative care: Secondary | ICD-10-CM | POA: Diagnosis not present

## 2023-05-01 LAB — PROTIME-INR
INR: 1 (ref 0.8–1.2)
Prothrombin Time: 13.2 seconds (ref 11.4–15.2)

## 2023-05-01 LAB — GLUCOSE, CAPILLARY
Glucose-Capillary: 190 mg/dL — ABNORMAL HIGH (ref 70–99)
Glucose-Capillary: 268 mg/dL — ABNORMAL HIGH (ref 70–99)
Glucose-Capillary: 329 mg/dL — ABNORMAL HIGH (ref 70–99)

## 2023-05-01 LAB — BPAM FFP
Blood Product Expiration Date: 202405292359
Blood Product Expiration Date: 202405292359
ISSUE DATE / TIME: 202405241224
ISSUE DATE / TIME: 202405241443

## 2023-05-01 LAB — PREPARE FRESH FROZEN PLASMA

## 2023-05-01 LAB — HEPARIN LEVEL (UNFRACTIONATED): Heparin Unfractionated: 1.08 IU/mL — ABNORMAL HIGH (ref 0.30–0.70)

## 2023-05-01 MED ORDER — HEPARIN (PORCINE) 25000 UT/250ML-% IV SOLN
1100.0000 [IU]/h | INTRAVENOUS | Status: DC
  Administered 2023-05-01: 1100 [IU]/h via INTRAVENOUS
  Filled 2023-05-01: qty 250

## 2023-05-01 MED ORDER — MORPHINE SULFATE ER 15 MG PO TBCR
60.0000 mg | EXTENDED_RELEASE_TABLET | Freq: Every day | ORAL | Status: DC
  Administered 2023-05-01: 60 mg via ORAL
  Filled 2023-05-01: qty 4

## 2023-05-01 MED ORDER — MORPHINE SULFATE ER 15 MG PO TBCR: 30.0000 mg | EXTENDED_RELEASE_TABLET | Freq: Every day | ORAL | Status: DC

## 2023-05-01 MED ORDER — INSULIN ASPART 100 UNIT/ML IJ SOLN
5.0000 [IU] | Freq: Once | INTRAMUSCULAR | Status: AC
  Administered 2023-05-01: 5 [IU] via SUBCUTANEOUS

## 2023-05-01 MED ORDER — DIPHENHYDRAMINE HCL 25 MG PO CAPS
50.0000 mg | ORAL_CAPSULE | Freq: Four times a day (QID) | ORAL | Status: DC | PRN
  Administered 2023-05-02 – 2023-05-05 (×7): 50 mg via ORAL
  Filled 2023-05-01 (×7): qty 2

## 2023-05-01 MED ORDER — HEPARIN BOLUS VIA INFUSION
4000.0000 [IU] | Freq: Once | INTRAVENOUS | Status: AC
  Administered 2023-05-01: 4000 [IU] via INTRAVENOUS
  Filled 2023-05-01: qty 4000

## 2023-05-01 MED ORDER — MORPHINE SULFATE ER 15 MG PO TBCR
30.0000 mg | EXTENDED_RELEASE_TABLET | Freq: Every day | ORAL | Status: DC
  Administered 2023-05-02: 30 mg via ORAL
  Filled 2023-05-01: qty 2

## 2023-05-01 MED ORDER — HEPARIN (PORCINE) 25000 UT/250ML-% IV SOLN
800.0000 [IU]/h | INTRAVENOUS | Status: DC
  Administered 2023-05-02: 800 [IU]/h via INTRAVENOUS
  Filled 2023-05-01: qty 250

## 2023-05-01 NOTE — Progress Notes (Signed)
ANTICOAGULATION CONSULT NOTE   Pharmacy Consult for Heparin Indication: atrial fibrillation  Allergies  Allergen Reactions   Norco [Hydrocodone-Acetaminophen] Itching and Other (See Comments)    Tolerates Oxycodone   Hydrocodone Itching    Tolerates oxycodone     Patient Measurements: Height: 5\' 11"  (180.3 cm) Weight: 80.7 kg (178 lb) IBW/kg (Calculated) : 70.8 Heparin Dosing Weight: 80.7 kg  Vital Signs: Temp: 98.8 F (37.1 C) (05/26 1504) Temp Source: Oral (05/26 1504) BP: 132/94 (05/26 1504) Pulse Rate: 62 (05/26 1504)  Labs: Recent Labs    04/29/23 0548 04/29/23 1812 04/30/23 0706 05/01/23 0703 05/01/23 1626  HGB 13.5  --   --   --   --   HCT 40.9  --   --   --   --   PLT 117*  --   --   --   --   LABPROT 32.7* 22.9* 24.7* 13.2  --   INR 3.2* 2.0* 2.2* 1.0  --   HEPARINUNFRC  --   --   --   --  1.08*  CREATININE 0.95  --   --   --   --      Estimated Creatinine Clearance: 67.7 mL/min (by C-G formula based on SCr of 0.95 mg/dL).  Assessment:  AC/Heme: PAF on warfarin PTA. CHADS2VASC4 -Per outpt cards notes, hold 5 days prior to bx -INR upper end of normal on fluconazole (LD 5/23). MD started holding warfarin 5/22.  - 5/23 INR up to 3.3.  Per IR,  INR </= 1.5 before liver biopsy or potential celiac block.  - 5/24 INR 3.2 - 5/25: INR 2.2- 5mg  IV Vit K, Hgb WNL, thrombocytopenia - 5/26: INR 1. Start IV heparin. No new CBC today  05/01/2023: first heparin level 1.08 above goal drawn 6 hours after 4000 unit bolus & heparin drip at 1100 units/hr.  Heparin level was drawn from the right hand, IV with heparin is in the left arm.  No bleeding per RN report  Goal of Therapy:  Heparin level 0.3-0.7 units/ml Monitor platelets by anticoagulation protocol: Yes   Plan:  Hold heparin x  1 hour, then resume at 900 units/hr and check 6 hour heparin level Holding warfarin Liver Bx next Tuesday (want INR<1.5)  Daily HL and CBC, daily INR  Herby Abraham,  Pharm.D Use secure chat for questions 05/01/2023 5:26 PM

## 2023-05-01 NOTE — Progress Notes (Signed)
Daily Progress Note   Patient Name: [name]       Date: 05/01/2023 DOB: [birth year]  Age: 62 y.o. MRN#: [id number] Attending Physician: Willeen Niece, MD Primary Care Physician: Chrys Racer, MD Admit Date: 04/26/2023 Length of Stay: 3 days  Reason for Consultation/Follow-up: Establishing goals of care and Symptom Management  Subjective:   CC: Patient reports feeling pain continues to slowly improve.  Following up regarding complex medical decision making and symptom management.   Subjective:  EMR prior to presenting to bedside.  At time of EMR review patient has received oxycodone 20 mg x 5 doses and IV Dilaudid 0.5 mg x 2 doses.  Patient continues to receive MS Contin 30 mg every 8 hours scheduled.  Noted patient's INR is 1 after receiving vitamin K yesterday.  Patient has been receiving as needed Benadryl for itching.  Discussed care with bedside RN prior to meeting with patient.  Presented to bedside to check on patient's symptoms at this time.  Patient laying comfortably in bed welcoming visit.  Patient notes that her pain continues to slowly improve.  We discussed based on her OME requirements in the past 24 hours, could consider increasing her MS Contin dose, particularly the nighttime dose to assist with more rest overnight and fluid less interruptions from pain awakening her.  Patient agreeing with this plan.  Will continue MS Contin 30 mg in a.m., 30 mg in afternoon, and increase nighttime dose to 60 mg.  Patient continues to feel that the oxycodone 20 mg every 3 hours as needed does relieve her pain enough when she needs to receive it. Patient described itching which is mostly relieved by Benadryl.  She does feel the Benadryl could provide more relief so noted would try increased dose.  If patient has increased lethargy, can decrease dose again.  Patient denies any lethargy with 25 mg dose.  Also discussed with patient will order to make sure a CMP is completed tomorrow  in a.m. to make sure that her liver function and certain lab work associated with that is not what is causing her underlying itching.  Patient agreed with this plan.  All questions answered at that time.  Thanked patient for allowing me to visit with her today.  Review of Systems Abdominal pain improved, constipation improved Objective:   Vital Signs:  BP (!) 173/98 (BP Location: Right Arm)   Pulse 61   Temp 98.6 F (37 C) (Oral)   Resp 14   Ht 5\' 11"  (1.803 m)   Wt 80.7 kg   LMP 12/06/1976   SpO2 100%   BMI 24.83 kg/m   Physical Exam: General: NAD, alert, laying in bed, chronically ill-appearing, pleasant Eyes: No drainage noted HENT: moist mucous membranes Cardiovascular: RRR Respiratory: no increased work of breathing noted, not in respiratory distress Abdomen: distended Skin: no rashes or lesions on visible skin Neuro: A&Ox4, following commands easily Psych: appropriately answers all questions   Imaging:  I personally reviewed recent imaging.   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  Patient has already undergone EUS with GI on 5/2 though pathology unfortunately was not nonconclusive.  Oncology following for pathology.  IR has been consulted to obtain another liver biopsy.  Palliative medicine team consulted to assist with complex medical decision making and pain  management.   Recommendations/Plan: # Complex medical decision making/goals of care:  -Patient continuing with current cancer directed workup and therapies.                 -  Code Status: Full Code    # Symptom management                -Pain, acute in the setting of recently diagnosed likely pancreatic cancer with metastatic disease to liver                               -Change scheduled MS Contin to 30mg  in AM, 30mg  in afternoon, and 60mg  qhs (~8 hours apart).                               -Continue p.o. oxycodone to 20 mg every 3 hours as needed.                               -Continue IV Dilaudid to 0.5 mg q2hrs for breakthrough pain after oral medication                               -Continue dexamethasone 4 mg daily to assist with inflammatory aspects of visceral pain from liver.                                -Already discussed with IR evaluation for possible celiac plexus nerve block to assist with multimodal pain management. With patient's improved INR, will likely have biopsy and possible block on Tuesday, 5/28.                   -Constipation                               -Continue senna 2 tabs today along with MiraLAX 17 g daily    -Itching   -Ordered lotion   -Increase benadryl to 50mg  q6hrs prn   -Ordered CMP for 5/27 to make sure liver function not contributing to itching   # Psycho-social/Spiritual Support:  - Support System: sister, brother, niece as per EMR review   # Discharge Planning:  TBD                -Ordered for referral to outpatient PMT at [facility].   Discussed with: patient, bedside RN  Thank you for allowing the palliative care team to participate in the care [name].  Alvester Morin, DO Palliative Care Provider PMT # [phone]  If patient remains symptomatic despite maximum doses, please call PMT at [phone] between 0700 and 1900. Outside of these hours, please call attending, as PMT does not have night coverage.  *Please note that  this is a verbal dictation therefore any spelling or grammatical errors are due to the "[employer] One" system interpretation.

## 2023-05-01 NOTE — Progress Notes (Signed)
ANTICOAGULATION CONSULT NOTE - Initial Consult  Pharmacy Consult for Heparin Indication: atrial fibrillation  Allergies  Allergen Reactions   Norco [Hydrocodone-Acetaminophen] Itching and Other (See Comments)    Tolerates Oxycodone   Hydrocodone Itching    Tolerates oxycodone     Patient Measurements: Height: 5\' 11"  (180.3 cm) Weight: 80.7 kg (178 lb) IBW/kg (Calculated) : 70.8 Heparin Dosing Weight: 80.7 kg  Vital Signs: Temp: 98.6 F (37 C) (05/26 0544) Temp Source: Oral (05/26 0544) BP: 173/98 (05/26 0544) Pulse Rate: 61 (05/26 0544)  Labs: Recent Labs    04/29/23 0548 04/29/23 1812 04/30/23 0706 05/01/23 0703  HGB 13.5  --   --   --   HCT 40.9  --   --   --   PLT 117*  --   --   --   LABPROT 32.7* 22.9* 24.7* 13.2  INR 3.2* 2.0* 2.2* 1.0  CREATININE 0.95  --   --   --     Estimated Creatinine Clearance: 67.7 mL/min (by C-G formula based on SCr of 0.95 mg/dL).   Medical History: Past Medical History:  Diagnosis Date   Acute pericardial effusion 05/17/2022   AKI (acute kidney injury) (HCC) 05/17/2022   Anxiety    Arthritis    Cervicalgia    CHF (congestive heart failure) (HCC)    Chondromalacia of patella    Chronic neck pain    Chronic pain syndrome    Depression    secondary to loss of her son at age 49   Diabetic neuropathy (HCC)    DMII (diabetes mellitus, type 2) (HCC)    Dysrhythmia    Enthesopathy of hip region    Fibromyalgia    GERD (gastroesophageal reflux disease)    Hep C w/o coma, chronic (HCC)    Hepatitis C    diagnosed 2005   Hypertension    Insomnia    Low back pain    Lumbago    Mitral regurgitation    Obesity    Pain in joint, upper arm    Primary localized osteoarthrosis, lower leg    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  CHADS2VASC4 -Per outpt cards notes, hold 5 days prior to bx -INR upper end of normal on fluconazole (LD 5/23). MD started holding warfarin 5/22.  - 5/23 INR up to 3.3.  Per IR,  INR </= 1.5 before liver biopsy or potential celiac block.  - 5/24 INR 3.2 - 5/25: INR 2.2- 5mg  IV Vit K, Hgb WNL, thrombocytopenia - 5/26: INR 1. Start IV heparin. No new CBC today  Goal of Therapy:  Heparin level 0.3-0.7 units/ml Monitor platelets by anticoagulation protocol: Yes   Plan:  Holding warfarin Liver Bx next Tuesday (want INR<1.5)  Start IV heparin 4000 unit IV bolus Heparin infusion at 1100 units/hr Will check heparin level in 6-8 hr Daily HL and CBC   Fidelia Cathers S. Merilynn Finland, PharmD, BCPS Clinical Staff Pharmacist [email or web address] Merilynn Finland, [employer] 05/01/2023,8:53 AM

## 2023-05-01 NOTE — Progress Notes (Signed)
PROGRESS NOTE    [name]  XBJ:[id number] DOB: 1961/04/01 DOA: 04/26/2023  PCP: Chrys Racer, MD  Brief Narrative:  This 62 years old female with PMH significant for rheumatic heart disease s/p MV and TV repair in 04/2022, history of pericardial effusion / tamponade s/p pericardiocentesis in June 2023, PAF on Coumadin, CKD stage IIIa, COPD, type 2 diabetes, hypertension, tobacco use presented with worsening abdominal pain. 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 Partial encasement of the left side of splenic artery. Splenic vein appears completely occluded. Had EUS with GI on 5//01/2020 with pancreatic body/tail mass biopsied but pathology unfortunately was non-diagnosed. Oncology has recommended liver biopsy with IR which she is still awaiting.  Patient reports worsening symptoms,  describes pain mostly in the epigastric region.  Patient has been taking MS Contin and oxycodone IR with partial pain relief.,  INR is 2.8.  Patient is admitted for further evaluation.  She is going to get liver biopsy once INR is below 1.5.   Assessment & Plan:   Principal Problem:   Intractable pain Active Problems:   Pancreatic cancer (HCC)   Paroxysmal atrial fibrillation (HCC)   COPD (chronic obstructive pulmonary disease) (HCC)   Type 2 diabetes mellitus with hyperlipidemia (HCC)   Hypertension associated with diabetes (HCC)   Chronic kidney disease, stage 3a (HCC)   Thrombocytopenia (HCC)   Pain of upper abdomen   High risk medication use   Need for emotional support   Constipation   Pancreatic cancer metastasized to liver [facility])   Palliative care encounter   Medication management  Intractable pain: -Likely in the setting of recently diagnosed metastatic pancreatic cancer. -Continue IV opioids for pain control as  needed. -Continue home MS Contin 30mg  BID. -Continue home oxycodone IR 15mg  q6hr prn for breakthrough pain. -will need adjustments to pain medications prior to her discharge. -May also benefit from palliative consult as her cancer appears aggressive. -Patient is going to get celiac plexus nerve block for pain control. -INR is still elevated,  FFP ordered to reverse coumadin effect. -INR still remains 2.2, Vitamin K 5 mg given. INR 1.0 -Started on IV heparin.   Pancreatic cancer 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 -She has had EUS with GI on 5//01/2020 with pancreatic body / tail mass biopsied but pathology unfortunately was non-diagnosed.  -Oncology has recommended liver biopsy with IR which she is still awaiting. -Plan: Follow biopsy results , then will most likely be palliative chemotherapy -Following with oncologist Dr. Mosetta Putt. -INR should be 1.5 or less before liver biopsy and potential celiac block can be done. -INR is still elevated,  FFP ordered to reverse coumadin effect. -INR still remains 2.2, Vitamin K 5 mg given.  -INR 1.0, started on IV heparin.  COPD : -Not in any acute exacerbation.   Paroxysmal atrial fibrillation (HCC) - Heart rate is well controlled. - Hold warfarin as patient is going to have liver biopsy. - Continue home beta-blocker and diltiazem. -INR 1.0, started on IV heparin.   Type 2 diabetes mellitus with hyperlipidemia (HCC) -Continue low dose SSI -Continue statin. -Continue Semglee 8 units daily   Thrombocytopenia (HCC) -plt of 135 -continue to follow.   Chronic kidney disease, stage 3a (HCC) -Creatinine is stable and at baseline.  Hypertension associated with diabetes (HCC) -Elevated due to pain and missing medications for a few days. -Continue home antihypertensives. -Added hydralazine 25 mg 3 times daily for better blood pressure control    DVT  prophylaxis:  IV Heparin Code Status: Full code Family Communication: No family at bed side. Disposition Plan:    Status is: Observation The patient remains OBS appropriate and will d/c before 2 midnights.  Patient admitted for intractable abdominal pain in the setting of pancreatic cancer.   Oncology is consulted, Patient is scheduled to have liver biopsy once INR below 1.5 Liver biopsy be tentatively scheduled on Tuesday. Patient want to have all the workup completed before she wants to discharge.   Consultants:  Oncology IR  Procedures:Liver biopsy  Antimicrobials: Anti-infectives (From admission, onward)    Start     Dose/Rate Route Frequency Ordered Stop   04/27/23 1200  fluconazole (DIFLUCAN) tablet 100 mg        100 mg Oral Daily 04/27/23 1027 04/28/23 1044      Subjective: Patient was seen and examined at bedside.  Overnight events noted.   Patient reports pain is reasonably controlled.  She is very deconditioned, sick looking. She denies any nausea and vomiting.  She is awaiting liver biopsy today.  Objective: Vitals:   04/30/23 0544 04/30/23 1402 04/30/23 2100 05/01/23 0544  BP: (!) 160/99 (!) 150/90 (!) 136/95 (!) 173/98  Pulse: 62 64 (!) 59 61  Resp: 16  14 14   Temp: 97.9 F (36.6 C)  98.6 F (37 C) 98.6 F (37 C)  TempSrc: Oral  Oral Oral  SpO2: 97% 100% 97% 100%  Weight:      Height:        Intake/Output Summary (Last 24 hours) at 05/01/2023 1212 Last data filed at 05/01/2023 0547 Gross per 24 hour  Intake --  Output 100 ml  Net -100 ml   Filed Weights   04/26/23 1819  Weight: 80.7 kg    Examination:  General exam: Appears cachectic, comfortable, sick looking, very deconditioned,  not in any distress. Respiratory system: CTA bilaterally,  Respiratory effort normal.  RR 13 Cardiovascular system: S1 & S2 heard, RRR. No JVD, murmurs, rubs, gallops or clicks. Gastrointestinal system: Abdomen is soft, diffusely tender , non distended,  BS+ Central nervous system: Alert and oriented x 3. No focal neurological deficits. Extremities: No edema, no cyanosis, no clubbing Skin: No rashes, lesions or ulcers Psychiatry: Judgement and insight appear normal. Mood & affect appropriate.     Data Reviewed: I have personally reviewed following labs and imaging studies  CBC: Recent Labs  Lab 04/24/23 1756 04/26/23 1914 04/28/23 0610 04/29/23 0548  WBC 5.3 5.2 5.6 4.5  NEUTROABS 3.3  --   --   --   HGB 12.2 14.6 13.8 13.5  HCT 38.3 43.8 42.2 40.9  MCV 92.7 90.5 93.6 90.5  PLT 118* 135* 120* 117*   Basic Metabolic Panel: Recent Labs  Lab 04/24/23 1756 04/26/23 1914 04/27/23 0602 04/28/23 0610 04/29/23 0548  NA 135 135 135 133* 132*  K 4.3 3.9 4.1 4.0 4.3  CL 98 100 101 100 99  CO2 26 22 24 22 25   GLUCOSE 304* 146* 129* 149* 206*  BUN 16 24* 24* 25* 27*  CREATININE 1.29* 0.96 0.96 0.86 0.95  CALCIUM 8.7* 8.8* 8.7* 8.4* 8.7*  MG  --   --   --  1.7 1.9  PHOS  --   --   --  3.4 3.9  GFR: Estimated Creatinine Clearance: 67.7 mL/min (by C-G formula based on SCr of 0.95 mg/dL). Liver Function Tests: Recent Labs  Lab 04/24/23 1756 04/26/23 1914  AST 19 20  ALT 19 18  ALKPHOS 76 87  BILITOT 0.5 0.9  PROT 6.5 7.4  ALBUMIN 3.7 3.8   Recent Labs  Lab 04/24/23 1756 04/26/23 1914  LIPASE 42 40   No results for input(s): "AMMONIA" in the last 168 hours. Coagulation Profile: Recent Labs  Lab 04/28/23 0610 04/29/23 0548 04/29/23 1812 04/30/23 0706 05/01/23 0703  INR 3.3* 3.2* 2.0* 2.2* 1.0   Cardiac Enzymes: No results for input(s): "CKTOTAL", "CKMB", "CKMBINDEX", "TROPONINI" in the last 168 hours. BNP (last 3 results) Recent Labs    08/24/22 1555 12/01/22 1629  PROBNP 2,927* 2,487*   HbA1C: No results for input(s): "HGBA1C" in the last 72 hours. CBG: Recent Labs  Lab 04/30/23 1201 04/30/23 1706 04/30/23 2056 05/01/23 0714 05/01/23 1156  GLUCAP 191* 311* 242* 190* 268*   Lipid  Profile: No results for input(s): "CHOL", "HDL", "LDLCALC", "TRIG", "CHOLHDL", "LDLDIRECT" in the last 72 hours. Thyroid Function Tests: No results for input(s): "TSH", "T4TOTAL", "FREET4", "T3FREE", "THYROIDAB" in the last 72 hours. Anemia Panel: No results for input(s): "VITAMINB12", "FOLATE", "FERRITIN", "TIBC", "IRON", "RETICCTPCT" in the last 72 hours. Sepsis Labs: No results for input(s): "PROCALCITON", "LATICACIDVEN" in the last 168 hours.  No results found for this or any previous visit (from the past 240 hour(s)).   Radiology Studies: No results found.  Scheduled Meds:  sodium chloride   Intravenous Once   ARIPiprazole  2.5 mg Oral Daily   atorvastatin  20 mg Oral Daily   buPROPion  300 mg Oral q AM   calamine  1 Application Topical QID   dexamethasone  4 mg Oral Daily   furosemide  40 mg Oral Daily   gabapentin  300 mg Oral TID   hydrALAZINE  25 mg Oral Q8H   insulin aspart  0-9 Units Subcutaneous TID WC   metoprolol tartrate  25 mg Oral BID   morphine  30 mg Oral Q8H   nicotine  21 mg Transdermal Daily   pantoprazole  40 mg Oral Daily   polyethylene glycol  17 g Oral Daily   senna  2 tablet Oral Daily   vortioxetine HBr  10 mg Oral Daily   Continuous Infusions:  heparin 1,100 Units/hr (05/01/23 1023)     LOS: 3 days    Time spent: 35 mins    Willeen Niece, MD [facility]   If 7PM-7AM, please contact night-coverage

## 2023-05-02 ENCOUNTER — Other Ambulatory Visit: Payer: Self-pay | Admitting: Internal Medicine

## 2023-05-02 DIAGNOSIS — R52 Pain, unspecified: Secondary | ICD-10-CM | POA: Diagnosis not present

## 2023-05-02 DIAGNOSIS — Z7189 Other specified counseling: Secondary | ICD-10-CM

## 2023-05-02 DIAGNOSIS — Z515 Encounter for palliative care: Secondary | ICD-10-CM | POA: Diagnosis not present

## 2023-05-02 DIAGNOSIS — R1010 Upper abdominal pain, unspecified: Secondary | ICD-10-CM | POA: Diagnosis not present

## 2023-05-02 DIAGNOSIS — K5900 Constipation, unspecified: Secondary | ICD-10-CM | POA: Diagnosis not present

## 2023-05-02 DIAGNOSIS — I480 Paroxysmal atrial fibrillation: Secondary | ICD-10-CM

## 2023-05-02 DIAGNOSIS — C259 Malignant neoplasm of pancreas, unspecified: Secondary | ICD-10-CM | POA: Diagnosis not present

## 2023-05-02 LAB — HEPARIN LEVEL (UNFRACTIONATED)
Heparin Unfractionated: 0.3 IU/mL (ref 0.30–0.70)
Heparin Unfractionated: 0.4 IU/mL (ref 0.30–0.70)
Heparin Unfractionated: 0.72 IU/mL — ABNORMAL HIGH (ref 0.30–0.70)
Heparin Unfractionated: 0.8 IU/mL — ABNORMAL HIGH (ref 0.30–0.70)

## 2023-05-02 LAB — CBC
HCT: 43.4 % (ref 36.0–46.0)
Hemoglobin: 14.4 g/dL (ref 12.0–15.0)
MCH: 30.2 pg (ref 26.0–34.0)
MCHC: 33.2 g/dL (ref 30.0–36.0)
MCV: 91 fL (ref 80.0–100.0)
Platelets: 168 10*3/uL (ref 150–400)
RBC: 4.77 MIL/uL (ref 3.87–5.11)
RDW: 14.5 % (ref 11.5–15.5)
WBC: 5.5 10*3/uL (ref 4.0–10.5)
nRBC: 0 % (ref 0.0–0.2)

## 2023-05-02 LAB — COMPREHENSIVE METABOLIC PANEL
ALT: 24 U/L (ref 0–44)
AST: 23 U/L (ref 15–41)
Albumin: 3.8 g/dL (ref 3.5–5.0)
Alkaline Phosphatase: 96 U/L (ref 38–126)
Anion gap: 10 (ref 5–15)
BUN: 22 mg/dL (ref 8–23)
CO2: 26 mmol/L (ref 22–32)
Calcium: 8.7 mg/dL — ABNORMAL LOW (ref 8.9–10.3)
Chloride: 96 mmol/L — ABNORMAL LOW (ref 98–111)
Creatinine, Ser: 0.95 mg/dL (ref 0.44–1.00)
GFR, Estimated: >60 mL/min (ref >60)
Glucose, Bld: 260 mg/dL — ABNORMAL HIGH (ref 70–99)
Potassium: 3.6 mmol/L (ref 3.5–5.1)
Sodium: 132 mmol/L — ABNORMAL LOW (ref 135–145)
Total Bilirubin: 0.6 mg/dL (ref 0.3–1.2)
Total Protein: 7.1 g/dL (ref 6.5–8.1)

## 2023-05-02 LAB — GLUCOSE, CAPILLARY
Glucose-Capillary: 276 mg/dL — ABNORMAL HIGH (ref 70–99)
Glucose-Capillary: 200 mg/dL — ABNORMAL HIGH (ref 70–99)
Glucose-Capillary: 329 mg/dL — ABNORMAL HIGH (ref 70–99)
Glucose-Capillary: 321 mg/dL — ABNORMAL HIGH (ref 70–99)

## 2023-05-02 LAB — PROTIME-INR
INR: 1.2 (ref 0.8–1.2)
Prothrombin Time: 15.1 seconds (ref 11.4–15.2)

## 2023-05-02 MED ORDER — AMLODIPINE BESYLATE 5 MG PO TABS
5.0000 mg | ORAL_TABLET | Freq: Every day | ORAL | Status: DC
  Administered 2023-05-02 – 2023-05-06 (×5): 5 mg via ORAL
  Filled 2023-05-02 (×5): qty 1

## 2023-05-02 MED ORDER — OXYCODONE HCL ER 40 MG PO T12A
40.0000 mg | EXTENDED_RELEASE_TABLET | Freq: Three times a day (TID) | ORAL | Status: DC
  Administered 2023-05-02 – 2023-05-06 (×12): 40 mg via ORAL
  Filled 2023-05-02 (×7): qty 1
  Filled 2023-05-02: qty 2
  Filled 2023-05-02 (×4): qty 1

## 2023-05-02 MED ORDER — TRAZODONE HCL 50 MG PO TABS
50.0000 mg | ORAL_TABLET | Freq: Once | ORAL | Status: AC
  Administered 2023-05-02: 50 mg via ORAL
  Filled 2023-05-02: qty 1

## 2023-05-02 MED ORDER — HEPARIN (PORCINE) 25000 UT/250ML-% IV SOLN: 700.0000 [IU]/h | INTRAVENOUS | Status: DC

## 2023-05-02 NOTE — Progress Notes (Signed)
Daily Progress Note   Patient Name: [name]       Date: 05/02/2023 DOB: 1961-01-05  Age: 62 y.o. MRN#: [id number] Attending Physician: Willeen Niece, MD Primary Care Physician: Chrys Racer, MD Admit Date: 04/26/2023 Length of Stay: 4 days  Reason for Consultation/Follow-up: Establishing goals of care and Symptom Management  Subjective:   CC: Patient reports feeling pain continues to slowly improve.  Following up regarding complex medical decision making and symptom management.   Subjective:  EMR prior to presenting to bedside.  At time of EMR review patient has received oxycodone 20 mg x 8 doses and IV Dilaudid 0.5 mg x 5 doses.  Patient has continued to receive MS Contin which was changed to 30mg  in a.m., 30mg  in afternoon, and 60mg  yesterday evening.  Presented to bedside to check on patient.  Able to review patient's pain at this time.  Patient's pain is still primarily located in her abdomen.  We discussed patient's continued frequent needs for short acting oxycodone.  Patient seems to respond better to oxycodone and morphine.  After discussion, we will transition to long-acting OxyContin instead of MS Contin to determine if provides patient more pain benefits.  Will continue short acting oxycodone and IV Dilaudid for breakthrough. Still planning on biopsy, port placement, and celiac plexus nerve block tomorrow.  With patient's permission, able to discuss planning for the future.  In particular discussed need for healthcare power of attorney.  Patient is not married.  Patient has 1 child who is a 63 year old female with developmental disabilities who lives in a group home.  Patient able to note that she has a friend and a sister she would want to be her people to make medical decisions for her if she could not.  Patient also appropriately asking questions about planning for if she passes away and what would happen to her son.  Encouraged that she would likely need to speak with  an outside source, even a lawyer, to have paperwork in place to make sure that her son is taking care of.  Can state that her primary concern is not even her health, it is her son.  Empathized with very difficult situation and appreciated all that patient has continued to do for her son showing how much she truly loves him. Patient willing to have chaplain come by to assist with completion of HCPOA documentation.   All questions answered at that time.  Thanked patient for allowing me to visit with her today.  Noted palliative medicine provider, Dr. Linna Darner, will be coming onto service to follow-up.  Review of Systems Abdominal pain improving Objective:   Vital Signs:  BP (!) 142/109 (BP Location: Left Arm) Comment: pt. woke up in pain. scheduled bp med given and pain meds. will have day RN recheck.  Pulse 71   Temp 98 F (36.7 C) (Oral)   Resp 14   Ht 5\' 11"  (1.803 m)   Wt 80.7 kg   LMP 12/06/1976   SpO2 99%   BMI 24.83 kg/m   Physical Exam: General: NAD, alert, laying in bed, chronically ill-appearing, pleasant Eyes: No drainage noted HENT: moist mucous membranes Cardiovascular: RRR Respiratory: no increased work of breathing noted, not in respiratory distress Abdomen: distended Skin: no rashes or lesions on visible skin Neuro: A&Ox4, following commands easily Psych: appropriately answers all questions   Imaging:  I personally reviewed recent imaging.   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  Imaging has noted tumor encasement of the celiac  artery and its branches as well as the left side of the splenic artery with splenic vein appearing completely occluded.  Patient has already undergone EUS with GI on 5/2 though pathology unfortunately was not nonconclusive.  Oncology following for pathology.  IR has been consulted to obtain another liver biopsy.  Palliative medicine team consulted to assist with complex medical decision making and pain management.   Recommendations/Plan: # Complex medical decision making/goals of care:  -Patient continuing with current cancer directed workup and therapies.   -Willing to meet with chaplain to discuss HCPOA documentation completion.  Noted importance of this with patient not being married and having a 41 year old son with developmental disabilities who lives in a group home.  Encourage patient to participate in future planning knowing that she has a serious cancer.  Patient acknowledged importance of this.                -  Code Status: Full Code    # Symptom management                -Pain, acute in the setting of recently diagnosed likely pancreatic cancer with metastatic disease to liver Based on OME requirements in past 24 hours, patient can receive additional long-acting dosing.  After discussion with patient today, patient appears to respond better to oxycodone medication.  Will transition from MS Contin to OxyContin.                               -Discontinue MS Contin    -Start OxyContin 40mg  q8hrs scheduled during the day. May need to make further adjustments based on patient's symptom response.                                -Continue p.o. oxycodone to 20 mg every 3 hours as needed.                               -Continue IV Dilaudid to 0.5 mg q2hrs for breakthrough pain after oral medication                               -Continue dexamethasone 4 mg daily to assist with inflammatory aspects of visceral pain from liver.                                -Already discussed with IR evaluation for  possible celiac plexus nerve block to assist with multimodal pain management. Planned to have have biopsy, port placement, and celiac nerve block Tuesday, 5/28.    *Asked for medications to be sent to [facility] pharmacy at time of discharge so medications can be brought bedside to her. She is worried about this.*                 -Constipation                               -  Continue senna 2 tabs today along with MiraLAX 17 g daily    -Itching   -Ordered lotion   -Continue benadryl to 50mg  q6hrs prn   -Reviewed LFTs today which are within normal limits   # Psycho-social/Spiritual Support:  - Support System: sister,friend -Patient willing to meet with chaplain to complete HCPOA paperwork.  Patient has 1 child who is a 63 year old female with developmental disability who lives in a group home.  Encouraged discussions outside of hospital with appropriate resources to have plan for his care moving forward as well.   # Discharge Planning:  TBD                -Already ordered for referral to outpatient PMT at [facility].   Discussed with: patient, bedside RN, pharmacy, hospitalist  Thank you for allowing the palliative care team to participate in the care [name].  Alvester Morin, DO Palliative Care Provider PMT # [phone]  If patient remains symptomatic despite maximum doses, please call PMT at [phone] between 0700 and 1900. Outside of these hours, please call attending, as PMT does not have night coverage.  This provider spent a total of 50 minutes providing patient's care.  Includes review of EMR, discussing care with other staff members involved in patient's medical care, obtaining relevant history and information from patient and/or patient's family, and personal review of imaging and lab work. Greater than 50% of the time was spent counseling and coordinating care related to the above assessment and plan.    *Please note that this is a verbal dictation therefore any spelling or  grammatical errors are due to the "[employer]" system interpretation.

## 2023-05-02 NOTE — Progress Notes (Signed)
Chaplain engaged in an initial visit with [place]. Chaplain provided AD support and education. She would like to assign her sister, Alvira Philips, as her healthcare POA. Chaplain helped Alvira Philips fill the paperwork out and will work to get it notarized later today. No notary available at this time.     05/02/23 1400  Spiritual Encounters  Type of Visit Initial  Care provided to: Patient  Reason for visit Advance directives  Spiritual Framework  Presenting Themes Goals in life/care

## 2023-05-02 NOTE — Progress Notes (Signed)
ANTICOAGULATION CONSULT NOTE   Pharmacy Consult for Heparin Indication: atrial fibrillation  Allergies  Allergen Reactions   Norco [Hydrocodone-Acetaminophen] Itching and Other (See Comments)    Tolerates Oxycodone   Hydrocodone Itching    Tolerates oxycodone     Patient Measurements: Height: 5\' 11"  (180.3 cm) Weight: 80.7 kg (178 lb) IBW/kg (Calculated) : 70.8 Heparin Dosing Weight: 80.7 kg  Vital Signs: Temp: 98.5 F (36.9 C) (05/26 2200) Temp Source: Oral (05/26 2200) BP: 119/91 (05/26 2200) Pulse Rate: 66 (05/26 2200)  Labs: Recent Labs    04/29/23 0548 04/29/23 1812 04/30/23 0706 05/01/23 0703 05/01/23 1626 05/02/23 0023  HGB 13.5  --   --   --   --   --   HCT 40.9  --   --   --   --   --   PLT 117*  --   --   --   --   --   LABPROT 32.7* 22.9* 24.7* 13.2  --   --   INR 3.2* 2.0* 2.2* 1.0  --   --   HEPARINUNFRC  --   --   --   --  1.08* 0.80*  CREATININE 0.95  --   --   --   --   --      Estimated Creatinine Clearance: 67.7 mL/min (by C-G formula based on SCr of 0.95 mg/dL).  Assessment:  AC/Heme: PAF on warfarin PTA. CHADS2VASC4 -Per outpt cards notes, hold 5 days prior to bx -INR upper end of normal on fluconazole (LD 5/23). MD started holding warfarin 5/22.  - 5/23 INR up to 3.3.  Per IR,  INR </= 1.5 before liver biopsy or potential celiac block.  - 5/24 INR 3.2 - 5/25: INR 2.2- 5mg  IV Vit K, Hgb WNL, thrombocytopenia - 5/26: INR 1. Start IV heparin. No new CBC today  05/02/2023:  HL 0.8 supra therapeutic on 900 units/hr Per RN no bleeding noted  Goal of Therapy:  Heparin level 0.3-0.7 units/ml Monitor platelets by anticoagulation protocol: Yes   Plan:  Decrease heparin drip to 800 units/hr Heparin level in 6 hours Holding warfarin Liver Bx next Tuesday (want INR<1.5)  Daily  CBC, daily INR   Arley Phenix RPh 05/02/2023, 12:45 AM

## 2023-05-02 NOTE — Care Management Important Message (Signed)
Important Message  Patient Details IM Letter given. Name: [name] MRN: [id number] Date of Birth: 1961-02-01   Medicare Important Message Given:  Yes     Caren Macadam 05/02/2023, 10:35 AM

## 2023-05-02 NOTE — Progress Notes (Signed)
Pharmacy Brief Note - Evening Anticoagulation Follow Up:  Pt is a 62 yoF on heparin drip for atrial fibrillation while warfarin is on hold. For full history, see note by Lynann Beaver, PharmD from earlier today.   Today, 05/02/23 Heparin level = 0.40 is therapeutic on heparin infusion of 650 units/hr Confirmed with RN - no interruptions/line issues. No signs of bleeding.   Goal: Heparin level 0.3 - 0.7 units/mL  Plan: Continue heparin infusion at current rate of 650 units/hr Check confirmatory 6 hour heparin level CBC with AM labs tomorrow  Cindi Carbon, PharmD 05/02/23 5:48 PM

## 2023-05-02 NOTE — Progress Notes (Signed)
Pharmacy Brief Note - Evening Anticoagulation Follow Up:  Pt is a 62 yoF on heparin drip for atrial fibrillation while warfarin is on hold. For full history, see note by Lynann Beaver, PharmD from earlier today.   Today, 05/02/23 Confirmatory heparin level 0.3, low end of therapeutic Confirmed with RN - no interruptions/line issues. No signs of bleeding.   Goal: Heparin level 0.3 - 0.7 units/mL  Plan: increase heparin infusion to 700 units/hr Heparin level with am labs CBC with AM labs tomorrow  Arley Phenix RPh 05/02/2023, 11:15 PM

## 2023-05-02 NOTE — Progress Notes (Signed)
Chaplain completed Advanced Directive, Healthcare POA, documents. Chaplain facilitated notarization and witnessing. Chaplain provided [place] with two copies, including the original. Chaplain input one in her medical chart. Chaplain scanned another over to the ACP system. Documents should show in the system in the next 48 hours.   Chaplain provided support.      05/02/23 1500  Spiritual Encounters  Type of Visit Follow up  Reason for visit Advance directives

## 2023-05-02 NOTE — Progress Notes (Signed)
ANTICOAGULATION CONSULT NOTE - Follow Up Consult  Pharmacy Consult for Heparin Indication: atrial fibrillation  Allergies  Allergen Reactions   Norco [Hydrocodone-Acetaminophen] Itching and Other (See Comments)    Tolerates Oxycodone   Hydrocodone Itching    Tolerates oxycodone     Patient Measurements: Height: 5\' 11"  (180.3 cm) Weight: 80.7 kg (178 lb) IBW/kg (Calculated) : 70.8 Heparin Dosing Weight: TBW  Vital Signs: Temp: 98 F (36.7 C) (05/27 0632) Temp Source: Oral (05/27 0632) BP: 142/109 (05/27 1096) Pulse Rate: 71 (05/27 0632)  Labs: Recent Labs    04/29/23 1812 04/30/23 0706 05/01/23 0703 05/01/23 1626 05/02/23 0023 05/02/23 0751  HGB  --   --   --   --   --  14.4  HCT  --   --   --   --   --  43.4  PLT  --   --   --   --   --  168  LABPROT 22.9* 24.7* 13.2  --   --   --   INR 2.0* 2.2* 1.0  --   --   --   HEPARINUNFRC  --   --   --  1.08* 0.80*  --     Estimated Creatinine Clearance: 67.7 mL/min (by C-G formula based on SCr of 0.95 mg/dL).   Medications:  Infusions:   heparin 800 Units/hr (05/02/23 0636)    Assessment: 62 yr female presented on 5/21 with intractable pain.  PMH significant for recently discovered pancreatic mass - awaiting IR biopsy to confirm diagnosis. Pt is chronically anticoagulated on warfarin for atrial fibrillation.  PMH also significant for rheumatic heart disease s/p MVR and TVR .  Warfarin has been on hold and reversed prior to planned IR biopsy on 5/28.  Pharmacy is consulted to dose heparin while INR is subtherapeutic off warfarin.    Significant events: - 5/20 last warfarin dose (prior to admit) - 5/23 INR up to 3.3 (fluconazole LD 5/23). - 5/24 INR 2.0, give FFP x2  units - 5/25: INR 2.2, give 5mg  IV Vit K - 5/26: INR 1, Start IV heparin  Today, 05/02/2023: INR 1.2 Heparin level elevated at 0.72 on heparin 800 units/hr CBC: Hgb and Plt WNL No bleeding or complications reported by RN   Goal of Therapy:   Heparin level 0.3-0.7 units/ml Monitor platelets by anticoagulation protocol: Yes   Plan:  Decrease to heparin IV infusion at 650 units/hr Heparin level 6 hours after rate change Daily heparin level and CBC Follow up plans for IR biopsy and hold time for heparin   Lynann Beaver PharmD, BCPS [facility] main pharmacy [phone] 05/02/2023 8:53 AM

## 2023-05-02 NOTE — Progress Notes (Signed)
PROGRESS NOTE    [name]  ZOX:[id number] DOB: [birth year] DOA: 04/26/2023  PCP: Chrys Racer, MD  Brief Narrative:  This 62 years old female with PMH significant for rheumatic heart disease s/p MV and TV repair in 04/2022, history of pericardial effusion / tamponade s/p pericardiocentesis in June 2023, PAF on Coumadin, CKD stage IIIa, COPD, type 2 diabetes, hypertension, tobacco use presented with worsening abdominal pain. 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 Partial encasement of the left side of splenic artery. Splenic vein appears completely occluded. Had EUS with GI on 5//01/2020 with pancreatic body/tail mass biopsied but pathology unfortunately was non-diagnosed. Oncology has recommended liver biopsy with IR which she is still awaiting.  Patient reports worsening symptoms,  describes pain mostly in the epigastric region.  Patient has been taking MS Contin and oxycodone IR with partial pain relief.,  INR is 2.8.  Patient is admitted for further evaluation.  She is going to get liver biopsy once INR is below 1.5.   Assessment & Plan:   Principal Problem:   Intractable pain Active Problems:   Pancreatic cancer (HCC)   Paroxysmal atrial fibrillation (HCC)   COPD (chronic obstructive pulmonary disease) (HCC)   Type 2 diabetes mellitus with hyperlipidemia (HCC)   Hypertension associated with diabetes (HCC)   Chronic kidney disease, stage 3a (HCC)   Thrombocytopenia (HCC)   Pain of upper abdomen   High risk medication use   Need for emotional support   Constipation   Pancreatic cancer metastasized to liver [facility])   Palliative care encounter   Medication management   Itching   Counseling and coordination of care  Intractable pain: -Likely in the setting of recently diagnosed metastatic pancreatic  cancer. -Continue IV opioids for pain control as needed. -Continue OxyContin 40 mg q 8 hrs -Continue oxycodone IR 20 mg q 3 hr prn for breakthrough pain. -will need adjustments to pain medications prior to her discharge. -Palliative care consulted, pain medications adjusted. -Patient is going to get celiac plexus nerve block for pain control. -INR was still elevated,  FFP ordered to reverse coumadin effect. -INR still remains 2.2, Vitamin K 5 mg given. INR 1.0 -Started on IV heparin.   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 -She has had EUS with GI on 5//01/2020 with pancreatic body / tail mass biopsied but pathology unfortunately was non-diagnosed.  -Oncology has recommended liver biopsy with IR which she is still awaiting. -Plan: Follow biopsy results , then will most likely be palliative chemotherapy. -Following with oncologist Dr. Mosetta Putt. -INR should be 1.5 or less before liver biopsy and potential celiac block can be done. -INR was still elevated,  FFP ordered to reverse the coumadin effect. -INR still remains 2.2, Vitamin K 5 mg given.  -INR 1.0, started on IV heparin.  COPD : -Not in any acute exacerbation.   Paroxysmal atrial fibrillation (HCC) - Heart rate is well controlled. - Hold warfarin as patient is going to have liver biopsy. - Continue home beta-blocker and diltiazem. -INR 1.0, started on IV heparin.   Type 2 diabetes mellitus with hyperlipidemia (HCC) -Continue low dose SSI -Continue statin. -Continue Semglee 8 units daily   Thrombocytopenia (HCC) -plt of 135 -continue to follow.   Chronic kidney disease, stage 3a (  HCC) -Creatinine is stable and at baseline.   Hypertension associated with diabetes (HCC) -Elevated due to pain and missing medications for a few days. -Continue home antihypertensives. -Added hydralazine 25 mg 3 times daily for better blood  pressure control    DVT prophylaxis:  IV Heparin Code Status: Full code Family Communication: No family at bed side. Disposition Plan:    Status is: Observation The patient remains OBS appropriate and will d/c before 2 midnights.  Patient admitted for intractable abdominal pain in the setting of pancreatic cancer.   Oncology is consulted, Patient is scheduled to have liver biopsy once INR below 1.5 Liver biopsy be tentatively scheduled on Tuesday. Patient want to have all the workup completed before she wants to discharge.   Consultants:  Oncology IR  Procedures:Liver biopsy  Antimicrobials: Anti-infectives (From admission, onward)    Start     Dose/Rate Route Frequency Ordered Stop   04/27/23 1200  fluconazole (DIFLUCAN) tablet 100 mg        100 mg Oral Daily 04/27/23 1027 04/28/23 1044      Subjective: Patient was seen and examined at bedside.  Overnight events noted.   Patient reports pain is not controlled.  Pain medication adjusted as per palliative care. She denies any nausea and vomiting.  She is awaiting liver biopsy today.  Objective: Vitals:   05/01/23 0544 05/01/23 1504 05/01/23 2200 05/02/23 0632  BP: (!) 173/98 (!) 132/94 (!) 119/91 (!) 142/109  Pulse: 61 62 66 71  Resp: 14 16 14 14   Temp: 98.6 F (37 C) 98.8 F (37.1 C) 98.5 F (36.9 C) 98 F (36.7 C)  TempSrc: Oral Oral Oral Oral  SpO2: 100% 97% 95% 99%  Weight:      Height:        Intake/Output Summary (Last 24 hours) at 05/02/2023 1348 Last data filed at 05/02/2023 0900 Gross per 24 hour  Intake 794.98 ml  Output 100 ml  Net 694.98 ml   Filed Weights   04/26/23 1819  Weight: 80.7 kg    Examination:  General exam: Appears cachectic, sick looking, very deconditioned, not in any distress. Respiratory system: CTA bilaterally,  Respiratory effort normal.  RR 14 Cardiovascular system: S1 & S2 heard, RRR. No JVD, murmurs, rubs, gallops or clicks. Gastrointestinal system: Abdomen is soft,  diffusely tender , non distended, BS+ Central nervous system: Alert and oriented x 3. No focal neurological deficits. Extremities: No edema, no cyanosis, no clubbing Skin: No rashes, lesions or ulcers Psychiatry: Judgement and insight appear normal. Mood & affect appropriate.     Data Reviewed: I have personally reviewed following labs and imaging studies  CBC: Recent Labs  Lab 04/26/23 1914 04/28/23 0610 04/29/23 0548 05/02/23 0751  WBC 5.2 5.6 4.5 5.5  HGB 14.6 13.8 13.5 14.4  HCT 43.8 42.2 40.9 43.4  MCV 90.5 93.6 90.5 91.0  PLT 135* 120* 117* 168   Basic Metabolic Panel: Recent Labs  Lab 04/26/23 1914 04/27/23 0602 04/28/23 0610 04/29/23 0548 05/02/23 0751  NA 135 135 133* 132* 132*  K 3.9 4.1 4.0 4.3 3.6  CL 100 101 100 99 96*  CO2 22 24 22 25 26   GLUCOSE 146* 129* 149* 206* 260*  BUN 24* 24* 25* 27* 22  CREATININE 0.96 0.96 0.86 0.95 0.95  CALCIUM 8.8* 8.7* 8.4* 8.7* 8.7*  MG  --   --  1.7 1.9  --   PHOS  --   --  3.4 3.9  --  GFR: Estimated Creatinine Clearance: 67.7 mL/min (by C-G formula based on SCr of 0.95 mg/dL). Liver Function Tests: Recent Labs  Lab 04/26/23 1914 05/02/23 0751  AST 20 23  ALT 18 24  ALKPHOS 87 96  BILITOT 0.9 0.6  PROT 7.4 7.1  ALBUMIN 3.8 3.8   Recent Labs  Lab 04/26/23 1914  LIPASE 40   No results for input(s): "AMMONIA" in the last 168 hours. Coagulation Profile: Recent Labs  Lab 04/29/23 0548 04/29/23 1812 04/30/23 0706 05/01/23 0703 05/02/23 0751  INR 3.2* 2.0* 2.2* 1.0 1.2   Cardiac Enzymes: No results for input(s): "CKTOTAL", "CKMB", "CKMBINDEX", "TROPONINI" in the last 168 hours. BNP (last 3 results) Recent Labs    08/24/22 1555 12/01/22 1629  PROBNP 2,927* 2,487*   HbA1C: No results for input(s): "HGBA1C" in the last 72 hours. CBG: Recent Labs  Lab 05/01/23 0714 05/01/23 1156 05/01/23 1634 05/02/23 0738 05/02/23 1209  GLUCAP 190* 268* 329* 276* 200*   Lipid Profile: No results for  input(s): "CHOL", "HDL", "LDLCALC", "TRIG", "CHOLHDL", "LDLDIRECT" in the last 72 hours. Thyroid Function Tests: No results for input(s): "TSH", "T4TOTAL", "FREET4", "T3FREE", "THYROIDAB" in the last 72 hours. Anemia Panel: No results for input(s): "VITAMINB12", "FOLATE", "FERRITIN", "TIBC", "IRON", "RETICCTPCT" in the last 72 hours. Sepsis Labs: No results for input(s): "PROCALCITON", "LATICACIDVEN" in the last 168 hours.  No results found for this or any previous visit (from the past 240 hour(s)).   Radiology Studies: No results found.  Scheduled Meds:  amLODipine  5 mg Oral Daily   ARIPiprazole  2.5 mg Oral Daily   atorvastatin  20 mg Oral Daily   buPROPion  300 mg Oral q AM   calamine  1 Application Topical QID   dexamethasone  4 mg Oral Daily   furosemide  40 mg Oral Daily   gabapentin  300 mg Oral TID   hydrALAZINE  25 mg Oral Q8H   insulin aspart  0-9 Units Subcutaneous TID WC   metoprolol tartrate  25 mg Oral BID   nicotine  21 mg Transdermal Daily   oxyCODONE  40 mg Oral Q8H   pantoprazole  40 mg Oral Daily   polyethylene glycol  17 g Oral Daily   senna  2 tablet Oral Daily   vortioxetine HBr  10 mg Oral Daily   Continuous Infusions:  heparin 650 Units/hr (05/02/23 1009)     LOS: 4 days    Time spent: 35 mins    Willeen Niece, MD [facility]   If 7PM-7AM, please contact night-coverage

## 2023-05-03 ENCOUNTER — Inpatient Hospital Stay (HOSPITAL_COMMUNITY): Admission: RE | Admit: 2023-05-03 | Payer: 59 | Source: Ambulatory Visit

## 2023-05-03 ENCOUNTER — Inpatient Hospital Stay (HOSPITAL_COMMUNITY): Payer: 59

## 2023-05-03 ENCOUNTER — Ambulatory Visit (HOSPITAL_COMMUNITY): Payer: 59

## 2023-05-03 ENCOUNTER — Inpatient Hospital Stay: Payer: 59 | Admitting: Nurse Practitioner

## 2023-05-03 ENCOUNTER — Inpatient Hospital Stay (HOSPITAL_COMMUNITY)
Admission: RE | Admit: 2023-05-03 | Discharge: 2023-05-03 | Disposition: A | Discharge status: Home / Self Care | Payer: 59 | Source: Ambulatory Visit | Attending: Hematology | Admitting: Hematology

## 2023-05-03 ENCOUNTER — Inpatient Hospital Stay: Payer: 59 | Admitting: Dietician

## 2023-05-03 DIAGNOSIS — R52 Pain, unspecified: Secondary | ICD-10-CM | POA: Diagnosis not present

## 2023-05-03 HISTORY — PX: IR IMAGING GUIDED PORT INSERTION: IMG5740

## 2023-05-03 LAB — CBC
HCT: 39.1 % (ref 36.0–46.0)
Hemoglobin: 13.3 g/dL (ref 12.0–15.0)
MCH: 30.5 pg (ref 26.0–34.0)
MCHC: 34 g/dL (ref 30.0–36.0)
MCV: 89.7 fL (ref 80.0–100.0)
Platelets: 177 10*3/uL (ref 150–400)
RBC: 4.36 MIL/uL (ref 3.87–5.11)
RDW: 14.5 % (ref 11.5–15.5)
WBC: 5.8 10*3/uL (ref 4.0–10.5)
nRBC: 0 % (ref 0.0–0.2)

## 2023-05-03 LAB — HEPARIN LEVEL (UNFRACTIONATED): Heparin Unfractionated: 0.33 IU/mL (ref 0.30–0.70)

## 2023-05-03 LAB — GLUCOSE, CAPILLARY
Glucose-Capillary: 328 mg/dL — ABNORMAL HIGH (ref 70–99)
Glucose-Capillary: 216 mg/dL — ABNORMAL HIGH (ref 70–99)
Glucose-Capillary: 192 mg/dL — ABNORMAL HIGH (ref 70–99)
Glucose-Capillary: 226 mg/dL — ABNORMAL HIGH (ref 70–99)
Glucose-Capillary: 380 mg/dL — ABNORMAL HIGH (ref 70–99)

## 2023-05-03 LAB — PROTIME-INR
INR: 1.4 — ABNORMAL HIGH (ref 0.8–1.2)
Prothrombin Time: 17.1 seconds — ABNORMAL HIGH (ref 11.4–15.2)

## 2023-05-03 MED ORDER — INSULIN ASPART 100 UNIT/ML IJ SOLN
5.0000 [IU] | Freq: Once | INTRAMUSCULAR | Status: AC
  Administered 2023-05-03: 5 [IU] via SUBCUTANEOUS

## 2023-05-03 MED ORDER — FENTANYL CITRATE (PF) 100 MCG/2ML IJ SOLN
INTRAMUSCULAR | Status: AC
  Filled 2023-05-03: qty 2

## 2023-05-03 MED ORDER — MIDAZOLAM HCL 2 MG/2ML IJ SOLN
INTRAMUSCULAR | Status: AC
  Filled 2023-05-03: qty 4

## 2023-05-03 MED ORDER — GELATIN ABSORBABLE 12-7 MM EX MISC
CUTANEOUS | Status: AC
  Filled 2023-05-03: qty 1

## 2023-05-03 MED ORDER — FENTANYL CITRATE (PF) 100 MCG/2ML IJ SOLN
INTRAMUSCULAR | Status: AC | PRN
  Administered 2023-05-03: 25 ug via INTRAVENOUS
  Administered 2023-05-03 – 2023-05-04 (×3): 50 ug via INTRAVENOUS

## 2023-05-03 MED ORDER — LIDOCAINE HCL 1 % IJ SOLN
INTRAMUSCULAR | Status: AC
  Filled 2023-05-03: qty 20

## 2023-05-03 MED ORDER — LIDOCAINE-EPINEPHRINE 1 %-1:100000 IJ SOLN
INTRAMUSCULAR | Status: AC
  Filled 2023-05-03: qty 1

## 2023-05-03 MED ORDER — HEPARIN (PORCINE) 25000 UT/250ML-% IV SOLN
650.0000 [IU]/h | INTRAVENOUS | Status: DC
  Administered 2023-05-03: 700 [IU]/h via INTRAVENOUS

## 2023-05-03 MED ORDER — MIDAZOLAM HCL 2 MG/2ML IJ SOLN
INTRAMUSCULAR | Status: AC | PRN
  Administered 2023-05-03 (×2): 1 mg via INTRAVENOUS
  Administered 2023-05-03: .5 mg via INTRAVENOUS
  Administered 2023-05-04 (×2): 1 mg via INTRAVENOUS

## 2023-05-03 MED ORDER — GELATIN ABSORBABLE 12-7 MM EX MISC
1.0000 | Freq: Once | CUTANEOUS | Status: AC
  Administered 2023-05-03: 1 via TOPICAL
  Filled 2023-05-03: qty 1

## 2023-05-03 MED ORDER — LIDOCAINE-EPINEPHRINE 1 %-1:100000 IJ SOLN
20.0000 mL | Freq: Once | INTRAMUSCULAR | Status: AC
  Administered 2023-05-03: 15 mL via INTRADERMAL
  Filled 2023-05-03: qty 20

## 2023-05-03 MED ORDER — CHLORHEXIDINE GLUCONATE CLOTH 2 % EX PADS
6.0000 | MEDICATED_PAD | Freq: Every day | CUTANEOUS | Status: DC
  Administered 2023-05-03 – 2023-05-06 (×3): 6 via TOPICAL

## 2023-05-03 MED ORDER — LIDOCAINE HCL 1 % IJ SOLN
20.0000 mL | Freq: Once | INTRAMUSCULAR | Status: AC
  Administered 2023-05-03: 10 mL via INTRADERMAL

## 2023-05-03 NOTE — Progress Notes (Signed)
ANTICOAGULATION CONSULT NOTE - Follow Up Consult  Pharmacy Consult for Heparin Indication: atrial fibrillation  Allergies  Allergen Reactions   Norco [Hydrocodone-Acetaminophen] Itching and Other (See Comments)    Tolerates Oxycodone   Hydrocodone Itching    Tolerates oxycodone     Patient Measurements: Height: 5\' 11"  (180.3 cm) Weight: 80.7 kg (178 lb) IBW/kg (Calculated) : 70.8 Heparin Dosing Weight:   Vital Signs: Temp: 98.3 F (36.8 C) (05/28 0527) Temp Source: Oral (05/28 0527) BP: 146/102 (05/28 1530) Pulse Rate: 75 (05/28 1515)  Labs: Recent Labs    05/01/23 0703 05/01/23 1626 05/02/23 0751 05/02/23 1610 05/02/23 2157 05/03/23 0557  HGB  --   --  14.4  --   --  13.3  HCT  --   --  43.4  --   --  39.1  PLT  --   --  168  --   --  177  LABPROT 13.2  --  15.1  --   --  17.1*  INR 1.0  --  1.2  --   --  1.4*  HEPARINUNFRC  --    < > 0.72* 0.40 0.30 0.33  CREATININE  --   --  0.95  --   --   --    < > = values in this interval not displayed.    Estimated Creatinine Clearance: 67.7 mL/min (by C-G formula based on SCr of 0.95 mg/dL).  Assessment: AC/Heme: hx afib on warfarin PTA (CHADS2VASC4) --> on hold for bx - home warfarin dose is 5 mg daily except 5 mg on Sundays (verified with pt on 5/28) -Per outpt cards notes, hold 5 days prior to bx - MD started holding warfarin 5/22.  - 5/25: INR 2.2- 5mg  IV Vit K, thrombocytopenia - 5/26: INR 1. Start IV heparin.  - 5/28: Liver bx. Resume hep 4 hrs after bx. Hep level 0.33 in goal this AM. Resume previous rate.  Goal of Therapy:  Heparin level 0.3-0.7 units/ml Monitor platelets by anticoagulation protocol: Yes   Plan:  - Dr. Lowella Dandy ok to resume IV heparin 4 hours after biopsy - Resume IV heparin 700 units/hr (no bolus) at 8pm. - Daily HL and CBC and INR   Kajal Scalici S. Merilynn Finland, PharmD, BCPS Clinical Staff Pharmacist [email or web address] Misty Stanley Stillinger 05/03/2023,4:09 PM

## 2023-05-03 NOTE — Progress Notes (Signed)
PROGRESS NOTE    [name]  SWF:[id number] DOB: 1961-09-21 DOA: 04/26/2023  PCP: Chrys Racer, MD  Brief Narrative:  This 62 years old female with PMH significant for rheumatic heart disease s/p MV and TV repair in 04/2022, history of pericardial effusion / tamponade s/p pericardiocentesis in June 2023, PAF on Coumadin, CKD stage IIIa, COPD, type 2 diabetes, hypertension, tobacco use presented in the ED with worsening abdominal pain. 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 Partial encasement of the left side of splenic artery. Splenic vein appears completely occluded. Had EUS with GI on 5//01/2023 with pancreatic body/tail mass biopsied but pathology unfortunately was non-diagnosed. Oncology has recommended liver biopsy with IR which she is still awaiting.  Patient reports worsening symptoms,  describes pain mostly in the epigastric region.  Patient has been taking MS Contin and oxycodone IR with partial pain relief.,  INR is 2.8.  Patient is admitted for further evaluation.  She is going to get liver biopsy once INR is below 1.5.   Assessment & Plan:   Principal Problem:   Intractable pain Active Problems:   Pancreatic cancer (HCC)   Paroxysmal atrial fibrillation (HCC)   COPD (chronic obstructive pulmonary disease) (HCC)   Type 2 diabetes mellitus with hyperlipidemia (HCC)   Hypertension associated with diabetes (HCC)   Chronic kidney disease, stage 3a (HCC)   Thrombocytopenia (HCC)   Pain of upper abdomen   High risk medication use   Need for emotional support   Constipation   Pancreatic cancer metastasized to liver [facility])   Palliative care encounter   Medication management   Itching   Counseling and coordination of care   Goals of care, counseling/discussion  Intractable abdominal  pain: -Likely in the setting of recently diagnosed metastatic pancreatic cancer. -Continue IV opioids for pain control as needed. -Continue OxyContin 40 mg q 8 hrs. -Continue oxycodone IR 20 mg q 3 hr prn for breakthrough pain. -Palliative care consulted, pain medications adjusted. -Patient is going to get celiac plexus nerve block for pain control. -INR was still elevated,  FFP given to reverse coumadin effect. -INR still remains 2.2, Vitamin K 5 mg given. INR 1.0 -Started on IV heparin.   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 -She has had EUS with GI on 5//01/2020 with pancreatic body / tail mass biopsied but pathology unfortunately was non-diagnosed.  -Oncology has recommended liver biopsy with IR which she is still awaiting. -Plan: Follow biopsy results , then will most likely be palliative chemotherapy. -Following with oncologist Dr. Mosetta Putt. -INR should be 1.5 or less before liver biopsy and potential celiac block can be done. -INR was still elevated,  FFP ordered to reverse the coumadin effect. -INR still remains 2.2, Vitamin K 5 mg given.  -INR 1.0, started on IV heparin. -Patient underwent liver biopsy and Port-A-Cath placement. -Follow-up oncology recommendation.  COPD : -Not in any acute exacerbation.   Paroxysmal atrial fibrillation (HCC) - Heart rate is well controlled. - Warfarin is on hold as patient was scheduled to have liver biopsy. - Continue home beta-blocker and diltiazem. - INR 1.0, started on IV heparin. - Resume Coumadin and bridged with heparin if ok with IR   Type 2 diabetes mellitus with hyperlipidemia (HCC) -Continue low dose  SSI -Continue statin. -Continue Semglee 8 units daily   Thrombocytopenia (HCC) Platelet count improved.   Chronic kidney disease, stage 3a (HCC) -Creatinine is stable and at baseline.   Hypertension associated with diabetes  (HCC) -Elevated due to pain and missing medications for a few days. -Continue home antihypertensives. -Added hydralazine 25 mg 3 times daily for better blood pressure control    DVT prophylaxis:  IV Heparin Code Status: Full code Family Communication: No family at bed side. Disposition Plan:   Status is: Inpatient Remains inpatient appropriate because:    Patient admitted for intractable abdominal pain in the setting of pancreatic cancer.   Oncology is consulted, Patient underwent liver biopsy once INR below 1.5 Patient want to have all the workup completed before she wants to discharge. Follow up Oncology for Chemo or radiation treatment.  Consultants:  Oncology IR  Procedures:Liver biopsy  Antimicrobials: Anti-infectives (From admission, onward)    Start     Dose/Rate Route Frequency Ordered Stop   04/27/23 1200  fluconazole (DIFLUCAN) tablet 100 mg        100 mg Oral Daily 04/27/23 1027 04/28/23 1044      Subjective: Patient was seen and examined at bedside.  Overnight events noted.   Patient reports pain is manageable.  Pain medication adjusted as per palliative care. She denies any nausea and vomiting.  She is scheduled to have liver biopsy today.  Objective: Vitals:   05/01/23 2200 05/02/23 0632 05/02/23 2053 05/03/23 0527  BP: (!) 119/91 (!) 142/109 (!) 144/98 (!) 138/104  Pulse: 66 71 69 72  Resp: 14 14 16 17   Temp: 98.5 F (36.9 C) 98 F (36.7 C) (!) 97.4 F (36.3 C) 98.3 F (36.8 C)  TempSrc: Oral Oral Oral Oral  SpO2: 95% 99% 100% 100%  Weight:      Height:        Intake/Output Summary (Last 24 hours) at 05/03/2023 1346 Last data filed at 05/03/2023 0404 Gross per 24 hour  Intake 118.84 ml  Output --  Net 118.84 ml   Filed Weights   04/26/23 1819  Weight: 80.7 kg    Examination:  General exam: Appears cachectic, sick looking, very deconditioned, not in any distress. Respiratory system: CTA bilaterally,  Respiratory effort normal.  RR  16 Cardiovascular system: S1 & S2 heard, regular rate and rhythm, no murmur. Gastrointestinal system: Abdomen is soft, diffusely tender , non distended, BS+ Central nervous system: Alert and oriented x 3. No focal neurological deficits. Extremities: No edema, no cyanosis, no clubbing Skin: No rashes, lesions or ulcers Psychiatry: Judgement and insight appear normal. Mood & affect appropriate.     Data Reviewed: I have personally reviewed following labs and imaging studies  CBC: Recent Labs  Lab 04/26/23 1914 04/28/23 0610 04/29/23 0548 05/02/23 0751 05/03/23 0557  WBC 5.2 5.6 4.5 5.5 5.8  HGB 14.6 13.8 13.5 14.4 13.3  HCT 43.8 42.2 40.9 43.4 39.1  MCV 90.5 93.6 90.5 91.0 89.7  PLT 135* 120* 117* 168 177   Basic Metabolic Panel: Recent Labs  Lab 04/26/23 1914 04/27/23 0602 04/28/23 0610 04/29/23 0548 05/02/23 0751  NA 135 135 133* 132* 132*  K 3.9 4.1 4.0 4.3 3.6  CL 100 101 100 99 96*  CO2 22 24 22 25 26   GLUCOSE 146* 129* 149* 206* 260*  BUN 24* 24* 25* 27* 22  CREATININE 0.96 0.96 0.86 0.95 0.95  CALCIUM 8.8* 8.7* 8.4* 8.7* 8.7*  MG  --   --  1.7  1.9  --   PHOS  --   --  3.4 3.9  --    GFR: Estimated Creatinine Clearance: 67.7 mL/min (by C-G formula based on SCr of 0.95 mg/dL). Liver Function Tests: Recent Labs  Lab 04/26/23 1914 05/02/23 0751  AST 20 23  ALT 18 24  ALKPHOS 87 96  BILITOT 0.9 0.6  PROT 7.4 7.1  ALBUMIN 3.8 3.8   Recent Labs  Lab 04/26/23 1914  LIPASE 40   No results for input(s): "AMMONIA" in the last 168 hours. Coagulation Profile: Recent Labs  Lab 04/29/23 1812 04/30/23 0706 05/01/23 0703 05/02/23 0751 05/03/23 0557  INR 2.0* 2.2* 1.0 1.2 1.4*   Cardiac Enzymes: No results for input(s): "CKTOTAL", "CKMB", "CKMBINDEX", "TROPONINI" in the last 168 hours. BNP (last 3 results) Recent Labs    08/24/22 1555 12/01/22 1629  PROBNP 2,927* 2,487*   HbA1C: No results for input(s): "HGBA1C" in the last 72  hours. CBG: Recent Labs  Lab 05/02/23 1209 05/02/23 1628 05/02/23 2054 05/03/23 0725 05/03/23 1148  GLUCAP 200* 329* 321* 216* 192*   Lipid Profile: No results for input(s): "CHOL", "HDL", "LDLCALC", "TRIG", "CHOLHDL", "LDLDIRECT" in the last 72 hours. Thyroid Function Tests: No results for input(s): "TSH", "T4TOTAL", "FREET4", "T3FREE", "THYROIDAB" in the last 72 hours. Anemia Panel: No results for input(s): "VITAMINB12", "FOLATE", "FERRITIN", "TIBC", "IRON", "RETICCTPCT" in the last 72 hours. Sepsis Labs: No results for input(s): "PROCALCITON", "LATICACIDVEN" in the last 168 hours.  No results found for this or any previous visit (from the past 240 hour(s)).   Radiology Studies: No results found.  Scheduled Meds:  amLODipine  5 mg Oral Daily   ARIPiprazole  2.5 mg Oral Daily   atorvastatin  20 mg Oral Daily   buPROPion  300 mg Oral q AM   calamine  1 Application Topical QID   dexamethasone  4 mg Oral Daily   furosemide  40 mg Oral Daily   gabapentin  300 mg Oral TID   hydrALAZINE  25 mg Oral Q8H   insulin aspart  0-9 Units Subcutaneous TID WC   metoprolol tartrate  25 mg Oral BID   nicotine  21 mg Transdermal Daily   oxyCODONE  40 mg Oral Q8H   pantoprazole  40 mg Oral Daily   polyethylene glycol  17 g Oral Daily   senna  2 tablet Oral Daily   vortioxetine HBr  10 mg Oral Daily   Continuous Infusions:     LOS: 5 days    Time spent: 35 mins    Willeen Niece, MD [facility]   If 7PM-7AM, please contact night-coverage

## 2023-05-03 NOTE — Progress Notes (Signed)
Initial Nutrition Assessment  INTERVENTION:   Once diet advanced: -Ensure Plus High Protein po BID, each supplement provides 350 kcal and 20 grams of protein.   NUTRITION DIAGNOSIS:   Increased nutrient needs related to cancer and cancer related treatments as evidenced by estimated needs.  GOAL:   Patient will meet greater than or equal to 90% of their needs  MONITOR:   PO intake, Supplement acceptance, Diet advancement, Labs, Weight trends, I & O's  REASON FOR ASSESSMENT:   Malnutrition Screening Tool    ASSESSMENT:   62 years old female with PMH significant for rheumatic heart disease s/p MV and TV repair in 04/2022, history of pericardial effusion / tamponade s/p pericardiocentesis in June 2023, PAF on Coumadin, CKD stage IIIa, COPD, type 2 diabetes, hypertension, tobacco use presented in the ED with worsening abdominal pain.  Patient unavailable at time of visit, having port placed as well as liver biopsy. Per IR note, will be NPO tomorrow for celiac plexus neurolysis.  Per chart review, pt documented as consuming 50% of one meal on 5/27. Poor PO likely related to pain.  Recommend protein supplements once diet is advanced following procedures.  Per weight records, pt weighed 178 lbs at admission on 5/21. No other weights for this admission. Pt's weight is trending down since January 2024, weighed 199 lbs on 1/15.  Medications: Lasix, Miralax, Senokot  Labs reviewed: CBGs: 192-329   NUTRITION - FOCUSED PHYSICAL EXAM:  Unable to complete  Diet Order:   Diet Order             Diet NPO time specified Except for: Sips with Meds  Diet effective midnight           Diet NPO time specified Except for: Sips with Meds  Diet effective midnight                   EDUCATION NEEDS:   Not appropriate for education at this time  Skin:  Skin Assessment: Reviewed RN Assessment  Last BM:  5/27  Height:   Ht Readings from Last 1 Encounters:  04/26/23 5\' 11"  (1.803 m)     Weight:   Wt Readings from Last 1 Encounters:  04/26/23 80.7 kg    BMI:  Body mass index is 24.83 kg/m.  Estimated Nutritional Needs:   Kcal:  2000-2200  Protein:  95-105g  Fluid:  2L/day  Tilda Franco, MS, RD, LDN Inpatient Clinical Dietitian Contact information available via Amion

## 2023-05-03 NOTE — Procedures (Signed)
Interventional Radiology Procedure:   Indications: Pancreatic mass with liver lesions  Procedure: Port placement and US guided liver lesion biopsy  Findings: Right jugular port, tip at SVC/RA junction.  Port is accessed.  US guided core biopsies from a small right hepatic lesion.   Complications: None     EBL: Minimal, less than 10 ml  Plan: Bedrest 3 hours.   Port is ready to use.  May resume diet.    Michaelia Beilfuss R. Lowella Dandy, MD  Pager: [phone]

## 2023-05-03 NOTE — Progress Notes (Addendum)
ANTICOAGULATION CONSULT NOTE - Follow Up Consult  Pharmacy Consult for heparin Indication: hx atrial fibrillation (PTA warfarin on hold)  Allergies  Allergen Reactions   Norco [Hydrocodone-Acetaminophen] Itching and Other (See Comments)    Tolerates Oxycodone   Hydrocodone Itching    Tolerates oxycodone     Patient Measurements: Height: 5\' 11"  (180.3 cm) Weight: 80.7 kg (178 lb) IBW/kg (Calculated) : 70.8 Heparin Dosing Weight: 80 kg  Vital Signs: Temp: 98.3 F (36.8 C) (05/28 0527) Temp Source: Oral (05/28 0527) BP: 138/104 (05/28 0527) Pulse Rate: 72 (05/28 0527)  Labs: Recent Labs    05/01/23 0703 05/01/23 1626 05/02/23 0751 05/02/23 1610 05/02/23 2157 05/03/23 0557  HGB  --   --  14.4  --   --  13.3  HCT  --   --  43.4  --   --  39.1  PLT  --   --  168  --   --  177  LABPROT 13.2  --  15.1  --   --  17.1*  INR 1.0  --  1.2  --   --  1.4*  HEPARINUNFRC  --    < > 0.72* 0.40 0.30 0.33  CREATININE  --   --  0.95  --   --   --    < > = values in this interval not displayed.    Estimated Creatinine Clearance: 67.7 mL/min (by C-G formula based on SCr of 0.95 mg/dL).   Medications:  - PTA warfarin regimen: 5mg  daily except 2.5mg  on Sundays  Assessment: Patient is a 61 y.o F with hx afib on warfarin PTA, who was recently diagnosed with pancreatic cancer and was supposed to have a liver biopsy and port placement on 05/03/23. She presented to the ED on 04/26/23 with c/o abdominal pain. Warfarin held on admission, INR was reversed with vit K and FFP and heparin started on 5/26 in anticipation for biopsy.   Today, 05/03/2023: - heparin level is therapeutic at 0.33 - INR 1.4 - cbc stable - no bleeding  - plan for liver biopsy today.  I spoke to IR nurse.  She said patient is an add on and is not sure when her procedure will be today.  She will inform IR PA to address if/when heparin drip needs to held for procedure.   Significant events: - 5/24 INR 2.0, give FFP x2   units - 5/25: INR 2.2, give 5mg  IV Vit K - 5/26: INR 1, Start IV heparin  Goal of Therapy:  Heparin level 0.3-0.7 units/ml Monitor platelets by anticoagulation protocol: Yes   Plan:  - continue heparin drip at 700 units/hr - Please advise if/when heparin drip needs to be held for biopsy procedure today   Jood Retana P 05/03/2023,7:41 AM

## 2023-05-03 NOTE — Progress Notes (Signed)
Scheduled to see patient for nutrition assessment. Chart reviewed. Patient is currently hospitalized. Will reschedule as able.

## 2023-05-03 NOTE — Progress Notes (Addendum)
Referring Physician(s): Feng,Y  Supervising Physician: Richarda Overlie  Patient Status:  [facility] - In-pt  Chief Complaint: Abdominal/back pain, pancreatic mass/liver lesions   Subjective: Pt with persistent abd/back pain; scheduled for liver mass biopsy and port a cath placement today, possible celiac plexus neurolysis on 5/29   Allergies: Norco [hydrocodone-acetaminophen] and Hydrocodone  Medications: Prior to Admission medications   Medication Sig Start Date End Date Taking? Authorizing Provider  ARIPiprazole (ABILIFY) 5 MG tablet Take 2.5 mg by mouth daily. 07/20/22  Yes [provider]  atorvastatin (LIPITOR) 20 MG tablet Take 20 mg by mouth daily.   Yes [provider]  buPROPion (WELLBUTRIN XL) 300 MG 24 hr tablet Take 300 mg by mouth in the morning. 03/24/22  Yes [provider]  dexlansoprazole (DEXILANT) 60 MG capsule Take 60 mg by mouth daily. 07/26/22  Yes [provider]  diclofenac Sodium (VOLTAREN) 1 % GEL Apply 2 g topically 4 (four) times daily as needed (pain). 06/29/17  Yes [provider]  famotidine (PEPCID) 20 MG tablet Take 20 mg by mouth daily.   Yes [provider]  FARXIGA 10 MG TABS tablet Take 10 mg by mouth daily. 08/10/22  Yes [provider]  FLUoxetine (PROZAC) 20 MG capsule Take 20 mg by mouth daily. 09/20/22  Yes [provider]  furosemide (LASIX) 40 MG tablet Take 1 tablet (40 mg total) by mouth daily. 02/28/23  Yes Chandrasekhar, Mahesh A, MD  gabapentin (NEURONTIN) 300 MG capsule Take 300 mg by mouth 3 (three) times daily. 08/19/22  Yes [provider]  hydrALAZINE (APRESOLINE) 25 MG tablet Take 1 tablet (25 mg total) by mouth daily as needed (BP>150). 04/26/23  Yes Chandrasekhar, Mahesh A, MD  insulin glargine (LANTUS) 100 UNIT/ML Solostar Pen Inject 15 Units into the skin daily. 04/09/23  Yes Glade Lloyd, MD  linagliptin (TRADJENTA) 5 MG TABS tablet Take 5 mg by mouth daily.    Yes [provider]  metoprolol tartrate (LOPRESSOR) 25 MG tablet Take 12.5 mg by mouth 2 (two) times daily.   Yes [provider]  metoprolol tartrate (LOPRESSOR) 50 MG tablet Take 50 mg by mouth 2 (two) times daily.   Yes [provider]  morphine (MS CONTIN) 30 MG 12 hr tablet Take 1 tablet (30 mg total) by mouth every 12 (twelve) hours. 04/25/23  Yes Pollyann Samples, NP  omeprazole (PRILOSEC) 10 MG capsule Take 10 mg by mouth daily.   Yes [provider]  oxyCODONE (ROXICODONE) 15 MG immediate release tablet Take 1 tablet (15 mg total) by mouth every 6 (six) hours as needed for pain. 04/25/23  Yes Pollyann Samples, NP  PREMARIN 0.45 MG tablet Take 0.45 mg by mouth daily. 01/11/22  Yes [provider]  QUEtiapine (SEROQUEL) 25 MG tablet Take 25 mg by mouth as needed. 03/17/23  Yes [provider]  TIADYLT ER 300 MG 24 hr capsule Take 300 mg by mouth daily. 03/11/23  Yes [provider]  tobramycin-dexamethasone (TOBRADEX) ophthalmic solution 1 drop 4 (four) times daily. 04/14/23  Yes [provider]  traZODone (DESYREL) 100 MG tablet Take 200 mg by mouth at bedtime. Pt takes 2 tablets 200 mg at bedtime.   Yes [provider]  TRINTELLIX 10 MG TABS tablet Take 10 mg by mouth daily. 10/22/22  Yes [provider]  Blood Glucose Monitoring Suppl DEVI 1 each by Does not apply route in the morning, at noon, and at bedtime. May substitute  to any manufacturer covered by patient's insurance. Patient not taking: Reported on 04/27/2023 04/09/23   Glade Lloyd, MD  Glucose Blood (BLOOD GLUCOSE TEST STRIPS) STRP 1 each by In Vitro route in the morning, at noon, and at bedtime. May substitute to any manufacturer covered by patient's insurance. Patient not taking: Reported on 04/27/2023 04/09/23   Glade Lloyd, MD  Insulin Pen Needle 31G X 5 MM MISC Lantus 15 units daily Patient not taking: Reported on 04/27/2023 04/09/23   Glade Lloyd, MD  Lancet Device MISC 1 each by Does not apply route in the morning, at noon, and at bedtime. May substitute to any manufacturer covered by patient's insurance. Patient not taking: Reported on 04/27/2023 04/09/23 05/09/23  Glade Lloyd, MD  Lancets Misc. MISC 1 each by Does not apply route in the morning, at noon, and at bedtime. May substitute to any manufacturer covered by patient's insurance. Patient not taking: Reported on 04/27/2023 04/09/23 05/09/23  Glade Lloyd, MD  naloxone [facility]) nasal spray 4 mg/0.1 mL PRN for suspected opiate OD Patient not taking: Reported on 04/27/2023 04/24/23   Glyn Ade, MD  Ethelda Chick (OYSTER CALCIUM) 500 MG TABS tablet Take 500 mg of elemental calcium by mouth daily. Patient not taking: Reported on 04/27/2023    [provider]  warfarin (COUMADIN) 5 MG tablet TAKE 1 TABLET BY MOUTH DAILY EXCEPT 1.5 TABLETS BY MOUTH ON SUNDAYS AS DIRECTED BY COUMADIN CLINIC 05/03/23   Christell Constant, MD     Vital Signs: BP (!) 138/104 (BP Location: Right Arm)   Pulse 72   Temp 98.3 F (36.8 C) (Oral)   Resp 17   Ht 5\' 11"  (1.803 m)   Wt 178 lb (80.7 kg)   LMP 12/06/1976   SpO2 100%   BMI 24.83 kg/m   Physical Exam awake/alert; chest- CTA bilat; heart- RRR; abd- soft,+BS, diffuse mod tenderness; no LE edema  Imaging: No results found.  Labs:  CBC: Recent Labs    04/28/23 0610 04/29/23 0548 05/02/23 0751 05/03/23 0557  WBC 5.6 4.5 5.5 5.8  HGB 13.8 13.5 14.4 13.3  HCT 42.2 40.9 43.4 39.1  PLT 120* 117* 168 177    COAGS: Recent Labs    05/28/22 1639 05/29/22 0516 04/30/23 0706 05/01/23 0703 05/02/23 0751 05/03/23 0557  INR 2.6*   < > 2.2* 1.0 1.2 1.4*  APTT 39*  --   --   --   --   --    < > = values in this interval not displayed.    BMP: Recent Labs    04/27/23 0602 04/28/23 0610 04/29/23 0548 05/02/23 0751  NA 135 133* 132* 132*  K 4.1 4.0 4.3 3.6  CL 101 100 99 96*  CO2 24 22 25 26   GLUCOSE 129*  149* 206* 260*  BUN 24* 25* 27* 22  CALCIUM 8.7* 8.4* 8.7* 8.7*  CREATININE 0.96 0.86 0.95 0.95  GFRNONAA >60 >60 >60 >60    LIVER FUNCTION TESTS: Recent Labs    04/04/23 0746 04/24/23 1756 04/26/23 1914 05/02/23 0751  BILITOT 0.7 0.5 0.9 0.6  AST 13* 19 20 23   ALT 17 19 18 24   ALKPHOS 63 76 87 96  PROT 6.4* 6.5 7.4 7.1  ALBUMIN 3.9 3.7 3.8 3.8    Assessment and Plan: Pt with hx pancreatic mass , liver lesions, persistent abd pain, DM, COPD, CKD, anxiety/depression, CHF, PAF, HTN; scheduled today for image guided liver lesion biopsy/port a cath placement; case reviewed by  Dr. Lowella Dandy; Risks and benefits of liver biopsy/port a cath placement was discussed with the patient including, but not limited to bleeding, infection, pneumothorax, venous thrombosis, damage to adjacent structures or low yield requiring additional tests.  All of the questions were answered and there is agreement to proceed. IV heparin has been stopped. PT/INR today 17.1/1.4  Consent signed and in chart. Pt's imaging studies were also reviewed by Dr. Fredia Sorrow for  celiac plexus neurolysis and pt appears to be candidate for procedure. Details/risks of procedure, incl but not limited to, internal bleeding, infection, injury to adjacent structures, inability to eradicate abdominal pain, diarrhea d/w pt with her understanding and consent. Procedure planned for 5/29 via CT guidance.     Electronically Signed: D. Jeananne Rama, PA-C 05/03/2023, 11:09 AM   I spent a total of 25 minutes at the the patient's bedside AND on the patient's hospital floor or unit, greater than 50% of which was counseling/coordinating care for port a cath placement/image guided liver lesion biopsy/CT guided celiac plexus neurolysis   Patient ID: [name], female   DOB: 1961-04-12, 62 y.o.   MRN: [id number]

## 2023-05-03 NOTE — Progress Notes (Signed)
Daily Progress Note   Patient Name: [name]       Date: 05/03/2023 DOB: [birth year]  Age: 62 y.o. MRN#: [id number] Attending Physician: Nicole Niece, MD Primary Care Physician: Nicole Racer, MD Admit Date: 04/26/2023 Length of Stay: 5 days  Reason for Consultation/Follow-up: Establishing goals of care and Symptom Management  Subjective:   CC: Patient reports feeling pain continues to slowly improve.  Following up regarding complex medical decision making and symptom management.   Subjective:  Patient history reviewed Checked in with the patient-she is awaiting liver biopsy, port placement and also under consideration for celiac plexus nerve block E Review of Systems Abdominal pain improving Objective:   Vital Signs:  BP (!) 138/104 (BP Location: Right Arm)   Pulse 72   Temp 98.3 F (36.8 C) (Oral)   Resp 17   Ht 5\' 11"  (1.803 m)   Wt 80.7 kg   LMP 12/06/1976   SpO2 100%   BMI 24.83 kg/m   Physical Exam: General: NAD, alert, laying in bed, chronically ill-appearing, pleasant Eyes: No drainage noted HENT: moist mucous membranes Cardiovascular: RRR Respiratory: no increased work of breathing noted, not in respiratory distress Abdomen: distended Skin: no rashes or lesions on visible skin Neuro: A&Ox4, following commands easily Psych: appropriately answers all questions   Imaging:  I personally reviewed recent imaging.   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  Patient has already undergone EUS with GI on 5/2 though pathology unfortunately was not nonconclusive.  Oncology following for pathology.  IR has been consulted to obtain another liver biopsy.  Palliative medicine team consulted to assist with complex medical decision making and pain management.   Recommendations/Plan: # Complex medical decision making/goals of care:  -Patient continuing with current cancer directed workup and therapies.   -Willing to meet with chaplain to discuss HCPOA documentation completion.  Noted importance of this with patient not being married and having a 74 year old son with developmental disabilities who lives in a group home.  Encourage patient to participate in future planning knowing that she has a serious cancer.  Patient acknowledged importance of this.                -  Code Status: Full Code    # Symptom management                -Pain, acute in the setting of recently diagnosed likely pancreatic cancer with metastatic disease to liver    OxyContin 40mg  q8hrs scheduled during the day. May need to make further adjustments based on patient's symptom response.                                -Continue p.o. oxycodone to 20 mg every 3 hours as needed.                               -  Continue IV Dilaudid to 0.5 mg q2hrs for breakthrough pain after oral medication                               -Continue dexamethasone 4 mg daily to assist with inflammatory aspects of visceral pain from liver.                                IR evaluation for possible celiac plexus nerve block to assist with multimodal pain management.  *Asked for medications to be sent to [facility] pharmacy at time of discharge so medications can be brought bedside to her. She is worried about this.*                 -Constipation                               -Continue senna 2 tabs today along with MiraLAX  17 g daily    -Itching   -Ordered lotion   -Continue benadryl to 50mg  q6hrs prn   -Reviewed LFTs today which are within normal limits   # Psycho-social/Spiritual Support:  - Support System: sister,friend -Patient willing to meet with chaplain to complete HCPOA paperwork.  Patient has 1 child who is a 63 year old female with developmental disability who lives in a group home.  Encouraged discussions outside of hospital with appropriate resources to have plan for his care moving forward as well.   # Discharge Planning:  TBD                -Already ordered for referral to outpatient PMT at [facility].   Discussed with: patient Thank you for allowing the palliative care team to participate in the care [name].  MDM Nicole Hawking, MD Palliative Care Provider PMT # [phone]  If patient remains symptomatic despite maximum doses, please call PMT at [phone] between 0700 and 1900. Outside of these hours, please call attending, as PMT does not have night coverage.  Greater than 50% of the time was spent counseling and coordinating care related to the above assessment and plan.    *Please note that this is a verbal dictation therefore any spelling or grammatical errors are due to the "[employer] One" system interpretation.

## 2023-05-04 ENCOUNTER — Inpatient Hospital Stay (HOSPITAL_COMMUNITY): Payer: 59

## 2023-05-04 DIAGNOSIS — C259 Malignant neoplasm of pancreas, unspecified: Secondary | ICD-10-CM

## 2023-05-04 DIAGNOSIS — R52 Pain, unspecified: Secondary | ICD-10-CM | POA: Diagnosis not present

## 2023-05-04 LAB — CBC
HCT: 36.2 % (ref 36.0–46.0)
Hemoglobin: 12.2 g/dL (ref 12.0–15.0)
MCH: 30.9 pg (ref 26.0–34.0)
MCHC: 33.7 g/dL (ref 30.0–36.0)
MCV: 91.6 fL (ref 80.0–100.0)
Platelets: 147 10*3/uL — ABNORMAL LOW (ref 150–400)
RBC: 3.95 MIL/uL (ref 3.87–5.11)
RDW: 14.6 % (ref 11.5–15.5)
WBC: 5.2 10*3/uL (ref 4.0–10.5)
nRBC: 0 % (ref 0.0–0.2)

## 2023-05-04 LAB — GLUCOSE, CAPILLARY
Glucose-Capillary: 227 mg/dL — ABNORMAL HIGH (ref 70–99)
Glucose-Capillary: 251 mg/dL — ABNORMAL HIGH (ref 70–99)
Glucose-Capillary: 252 mg/dL — ABNORMAL HIGH (ref 70–99)
Glucose-Capillary: 453 mg/dL — ABNORMAL HIGH (ref 70–99)
Glucose-Capillary: 490 mg/dL — ABNORMAL HIGH (ref 70–99)
Glucose-Capillary: 492 mg/dL — ABNORMAL HIGH (ref 70–99)

## 2023-05-04 LAB — HEPARIN LEVEL (UNFRACTIONATED): Heparin Unfractionated: 0.68 IU/mL (ref 0.30–0.70)

## 2023-05-04 LAB — PROTIME-INR
INR: 1.6 — ABNORMAL HIGH (ref 0.8–1.2)
Prothrombin Time: 18.8 seconds — ABNORMAL HIGH (ref 11.4–15.2)

## 2023-05-04 MED ORDER — METHYLPREDNISOLONE ACETATE 80 MG/ML IJ SUSP
INTRAMUSCULAR | Status: AC | PRN
  Administered 2023-05-04: 80 mg

## 2023-05-04 MED ORDER — ALUM & MAG HYDROXIDE-SIMETH 200-200-20 MG/5ML PO SUSP
30.0000 mL | ORAL | Status: DC | PRN
  Administered 2023-05-04: 30 mL via ORAL
  Filled 2023-05-04: qty 30

## 2023-05-04 MED ORDER — METHYLPREDNISOLONE ACETATE 80 MG/ML IJ SUSP
INTRAMUSCULAR | Status: AC
  Filled 2023-05-04: qty 1

## 2023-05-04 MED ORDER — FENTANYL CITRATE (PF) 100 MCG/2ML IJ SOLN
INTRAMUSCULAR | Status: AC
  Filled 2023-05-04: qty 2

## 2023-05-04 MED ORDER — INSULIN ASPART 100 UNIT/ML IJ SOLN
15.0000 [IU] | Freq: Once | INTRAMUSCULAR | Status: AC
  Administered 2023-05-04: 15 [IU] via SUBCUTANEOUS

## 2023-05-04 MED ORDER — TRAZODONE HCL 50 MG PO TABS
50.0000 mg | ORAL_TABLET | Freq: Once | ORAL | Status: AC
  Administered 2023-05-04: 50 mg via ORAL
  Filled 2023-05-04: qty 1

## 2023-05-04 MED ORDER — BUPIVACAINE HCL (PF) 0.5 % IJ SOLN
INTRAMUSCULAR | Status: AC | PRN
  Administered 2023-05-04: 5 mL

## 2023-05-04 MED ORDER — ALCOHOL (ABLYSINOL) 99% IA SOLN
INTRA_ARTERIAL | Status: AC | PRN
  Administered 2023-05-04: 20 mL

## 2023-05-04 MED ORDER — INSULIN GLARGINE-YFGN 100 UNIT/ML ~~LOC~~ SOLN
15.0000 [IU] | Freq: Every day | SUBCUTANEOUS | Status: DC
  Administered 2023-05-04: 15 [IU] via SUBCUTANEOUS
  Filled 2023-05-04 (×2): qty 0.15

## 2023-05-04 MED ORDER — INSULIN ASPART 100 UNIT/ML IJ SOLN
20.0000 [IU] | Freq: Once | INTRAMUSCULAR | Status: AC
  Administered 2023-05-04: 20 [IU] via SUBCUTANEOUS

## 2023-05-04 MED ORDER — IOHEXOL 300 MG/ML  SOLN
3.0000 mL | Freq: Once | INTRAMUSCULAR | Status: AC | PRN
  Administered 2023-05-04: 3 mL via INTRATHECAL

## 2023-05-04 MED ORDER — ALCOHOL (ABLYSINOL) 99% IA SOLN
INTRA_ARTERIAL | Status: AC
  Filled 2023-05-04: qty 5

## 2023-05-04 MED ORDER — HEPARIN (PORCINE) 25000 UT/250ML-% IV SOLN
650.0000 [IU]/h | INTRAVENOUS | Status: DC
  Administered 2023-05-04: 650 [IU]/h via INTRAVENOUS

## 2023-05-04 MED ORDER — MIDAZOLAM HCL 2 MG/2ML IJ SOLN
INTRAMUSCULAR | Status: AC
  Filled 2023-05-04: qty 2

## 2023-05-04 MED ORDER — BUPIVACAINE HCL 0.5 % IJ SOLN
50.0000 mL | Freq: Once | INTRAMUSCULAR | Status: DC | PRN
  Filled 2023-05-04 (×3): qty 50

## 2023-05-04 NOTE — Progress Notes (Signed)
PMT no charge note Patient off the floor at the time of this writer's visit, patient is to have celiac plexus neurolysis today. Medication history reviewed Chart reviewed  Palliative team to continue to follow for pain and non pain symptom management. No charge Rosalin Hawking, MD [place] [facility]

## 2023-05-04 NOTE — Progress Notes (Signed)
[name]   DOB:[birth year]   ZO#:[id number]   [id number]#:[id number]  Med/onc follow up   Subjective: Patient underwent a liver biopsy and port placement by IR yesterday, and had a celiac nerve block by IR Dr. Fredia Sorrow today.  She does not feel her pain has decreased after the procedure today.  She has no other new complaints.   Objective:  Vitals:   05/04/23 1335 05/04/23 1409  BP: 122/62 116/71  Pulse: 67 65  Resp: 17 18  Temp:  98.4 F (36.9 C)  SpO2: 100% 98%    Body mass index is 24.83 kg/m.  Intake/Output Summary (Last 24 hours) at 05/04/2023 1739 Last data filed at 05/04/2023 0043 Gross per 24 hour  Intake 274.71 ml  Output --  Net 274.71 ml     Sclerae unicteric  Oropharynx clear  No peripheral adenopathy  Lungs clear -- no rales or rhonchi  Heart regular rate and rhythm  Abdomen soft, with tenderness in the mid and upper abdomen  MSK no focal spinal tenderness, no peripheral edema  Neuro nonfocal    CBG (last 3)  Recent Labs    05/04/23 0741 05/04/23 1114 05/04/23 1656  GLUCAP 252* 227* 490*     Labs:   Urine Studies No results for input(s): "UHGB", "CRYS" in the last 72 hours.  Invalid input(s): "UACOL", "UAPR", "USPG", "UPH", "UTP", "UGL", "UKET", "UBIL", "UNIT", "UROB", "ULEU", "UEPI", "UWBC", "URBC", "UBAC", "CAST", "UCOM", "BILUA"  Basic Metabolic Panel: Recent Labs  Lab 04/28/23 0610 04/29/23 0548 05/02/23 0751  NA 133* 132* 132*  K 4.0 4.3 3.6  CL 100 99 96*  CO2 22 25 26   GLUCOSE 149* 206* 260*  BUN 25* 27* 22  CREATININE 0.86 0.95 0.95  CALCIUM 8.4* 8.7* 8.7*  MG 1.7 1.9  --   PHOS 3.4 3.9  --    GFR Estimated Creatinine Clearance: 67.7 mL/min (by C-G formula based on SCr of 0.95 mg/dL). Liver Function Tests: Recent Labs  Lab 05/02/23 0751  AST 23  ALT 24  ALKPHOS 96  BILITOT 0.6  PROT 7.1  ALBUMIN 3.8   No results for input(s): "LIPASE", "AMYLASE" in the last 168 hours.  No results for input(s): "AMMONIA" in the last  168 hours. Coagulation profile Recent Labs  Lab 04/30/23 0706 05/01/23 0703 05/02/23 0751 05/03/23 0557 05/04/23 0552  INR 2.2* 1.0 1.2 1.4* 1.6*    CBC: Recent Labs  Lab 04/28/23 0610 04/29/23 0548 05/02/23 0751 05/03/23 0557 05/04/23 0552  WBC 5.6 4.5 5.5 5.8 5.2  HGB 13.8 13.5 14.4 13.3 12.2  HCT 42.2 40.9 43.4 39.1 36.2  MCV 93.6 90.5 91.0 89.7 91.6  PLT 120* 117* 168 177 147*   Cardiac Enzymes: No results for input(s): "CKTOTAL", "CKMB", "CKMBINDEX", "TROPONINI" in the last 168 hours. BNP: Invalid input(s): "POCBNP" CBG: Recent Labs  Lab 05/03/23 1706 05/03/23 2158 05/04/23 0741 05/04/23 1114 05/04/23 1656  GLUCAP 226* 380* 252* 227* 490*   D-Dimer No results for input(s): "DDIMER" in the last 72 hours. Hgb A1c No results for input(s): "HGBA1C" in the last 72 hours. Lipid Profile No results for input(s): "CHOL", "HDL", "LDLCALC", "TRIG", "CHOLHDL", "LDLDIRECT" in the last 72 hours. Thyroid function studies No results for input(s): "TSH", "T4TOTAL", "T3FREE", "THYROIDAB" in the last 72 hours.  Invalid input(s): "FREET3" Anemia work up No results for input(s): "VITAMINB12", "FOLATE", "FERRITIN", "TIBC", "IRON", "RETICCTPCT" in the last 72 hours. Microbiology No results found for this or any previous visit (from the past 240 hour(s)).  Studies:  CT CELIAC PLEXUS BLOCK NEUROLYTIC  Result Date: 05/04/2023 CLINICAL DATA:  Refractory abdominal pain secondary to metastatic pancreatic carcinoma. The patient presents for celiac block with planned neurolysis of the celiac plexus. EXAM: CT GUIDED NEUROLYTIC ABLATION OF THE CELIAC AXIS ANESTHESIA/SEDATION: Moderate (conscious) sedation was employed during this procedure. A total of Versed 2.0 mg and Fentanyl 100 mcg was administered intravenously. Moderate Sedation Time: 23 minutes. The patient's level of consciousness and vital signs were monitored continuously by radiology nursing throughout the procedure under  my direct supervision. PROCEDURE: The procedure risks, benefits, and alternatives were explained to the patient. Questions regarding the procedure were encouraged and answered. The patient understands and consents to the procedure. A time-out was performed prior to initiating the procedure. CT was performed in a supine position through the upper to mid abdomen. The anterior abdominal wall was prepped with chlorhexidine in a sterile fashion, and a sterile drape was applied covering the operative field. A sterile gown and sterile gloves were used for the procedure. Local anesthesia was provided with 1% Lidocaine. A 22 gauge Chiba needle was advanced under CT guidance to the level of the celiac plexus. After confirming needle tip position, approximately three ml of a 1:10 dilution of Omnipaque-300 contrast and saline was injected. Spread of diluted contrast material was confirmed by CT. A mixture of 5 ml of 0.5% Sensorcaine and 80 mg Depo-Medrol was then made. This was injected through the needle. Alcohol neurolytic ablation of the celiac plexus was then performed with injection of 20 ml of absolute alcohol. COMPLICATIONS: None FINDINGS: Contrast injection showed excellent spread across the midline at the level of the celiac plexus. Alcohol ablation was successfully performed. IMPRESSION: CT guided neurolytic ablation of the celiac plexus as above. Alcohol ablation was performed with 20 ml of absolute alcohol. Therapeutic injection of Sensorcaine and Depo-Medrol was also performed just prior to alcohol ablation. Electronically Signed   By: Irish Lack M.D.   On: 05/04/2023 14:17   US BIOPSY (LIVER)  Result Date: 05/03/2023 INDICATION: 62 year old with pancreatic mass and liver lesions. Probable metastatic pancreatic cancer. Port-A-Cath was placed immediately prior to this procedure. EXAM: ULTRASOUND-GUIDED LIVER LESION BIOPSY MEDICATIONS: Moderate sedation ANESTHESIA/SEDATION: Moderate (conscious) sedation was  employed during this procedure. A total of Versed 1 mg was administered intravenously by the radiology nurse. Additional Versed and fentanyl was given immediately prior to this procedure for the Port-A-Cath placement. Total intra-service moderate Sedation Time: 20 minutes. The patient's level of consciousness and vital signs were monitored continuously by radiology nursing throughout the procedure under my direct supervision. FLUOROSCOPY TIME:  None COMPLICATIONS: None immediate. PROCEDURE: Informed written consent was obtained from the patient after a thorough discussion of the procedural risks, benefits and alternatives. All questions were addressed. Maximal Sterile Barrier Technique was utilized including caps, mask, sterile gowns, sterile gloves, sterile drape, hand hygiene and skin antiseptic. A timeout was performed prior to the initiation of the procedure. Ultrasound demonstrated subtle hypoechoic liver lesions. Right hepatic lesion was targeted. Right side of the abdomen was prepped with chlorhexidine and sterile field was created. Skin was anesthetized using 1% lidocaine. Small incision was made. Using ultrasound guidance, 17 gauge coaxial needle was directed into the right hepatic lobe and a hypoechoic lesion. Three core biopsies were obtained with an 18 gauge core device. Specimens placed in formalin. 17 gauge needle was removed without complication. Bandage placed over the puncture site. FINDINGS: Subtle hypoechoic lesions in the liver. Core biopsy needle was confirmed within a small  right hepatic lesion. No immediate bleeding or hematoma formation. IMPRESSION: Ultrasound-guided core biopsy of a right hepatic lesion. Electronically Signed   By: Richarda Overlie M.D.   On: 05/03/2023 21:38   IR IMAGING GUIDED PORT INSERTION  Result Date: 05/03/2023 INDICATION: 62 year old with pancreatic mass and liver lesions. Probable metastatic pancreatic cancer. Plan for placement of Port-A-Cath and liver lesion biopsy.  EXAM: FLUOROSCOPIC AND ULTRASOUND GUIDED PLACEMENT OF A SUBCUTANEOUS PORT COMPARISON:  None Available. MEDICATIONS: Moderate sedation ANESTHESIA/SEDATION: Moderate (conscious) sedation was employed during this procedure. A total of Versed 1.75 mg and fentanyl 100 mcg was administered intravenously at the order of the provider performing the procedure. Total intra-service moderate sedation time: 29 minutes. Patient's level of consciousness and vital signs were monitored continuously by radiology nurse throughout the procedure under the supervision of the provider performing the procedure. FLUOROSCOPY TIME:  Radiation Exposure Index (as provided by the fluoroscopic device): 1 mGy Kerma COMPLICATIONS: None immediate. PROCEDURE: The procedure, risks, benefits, and alternatives were explained to the patient. Questions regarding the procedure were encouraged and answered. The patient understands and consents to the procedure. Patient was placed supine on the interventional table. Ultrasound confirmed a patent right internal jugular vein. Ultrasound image was saved for documentation. The right chest and neck were cleaned with a skin antiseptic and a sterile drape was placed. Maximal barrier sterile technique was utilized including caps, mask, sterile gowns, sterile gloves, sterile drape, hand hygiene and skin antiseptic. The right neck was anesthetized with 1% lidocaine. Small incision was made in the right neck with a blade. Micropuncture set was placed in the right internal jugular vein with ultrasound guidance. The micropuncture wire was used for measurement purposes. The right chest was anesthetized with 1% lidocaine with epinephrine. #15 blade was used to make an incision and a subcutaneous port pocket was formed. 8 french Power Port was assembled. Subcutaneous tunnel was formed with a stiff tunneling device. The port catheter was brought through the subcutaneous tunnel. The port was placed in the subcutaneous pocket.  The micropuncture set was exchanged for a peel-away sheath. The catheter was placed through the peel-away sheath and the tip was positioned at the superior cavoatrial junction. Catheter placement was confirmed with fluoroscopy. The port was accessed and flushed with saline. The port pocket was closed using two layers of absorbable sutures and Dermabond. The vein skin site was closed using a single layer of absorbable suture and Dermabond. Sterile dressings were applied. Patient tolerated the procedure well without an immediate complication. Ultrasound and fluoroscopic images were taken and saved for this procedure. IMPRESSION: Placement of a subcutaneous power-injectable port device. Catheter tip at the superior cavoatrial junction. Electronically Signed   By: Richarda Overlie M.D.   On: 05/03/2023 21:35    Assessment: 62 y.o. female   Pancreatic mass and liver lesions, highly suspicious for metastatic pancreatic cancer Worsening abdominal pain, secondary to #1 Moderate protein and calorie malnutrition, anorexia Type 2 diabetes with neuropathy Atrial fibrillation, CHF CKD stage 3a COPD Anxiety and depression    Plan:  -Liver biopsy was done yesterday, pathology is still pending.  -I am glad to see that her pain has improved after the celiac nerve block today. -Continue follow-up with palliative care team, to adjust her pain medications -Hopefully she can be discharged home in the next few days -I will stop by tomorrow if her pathology is back, otherwise I will plan to see her back in office next week.   Malachy Mood, MD 05/04/2023  5:39  PM

## 2023-05-04 NOTE — Inpatient Diabetes Management (Signed)
Inpatient Diabetes Program Recommendations  AACE/ADA: New Consensus Statement on Inpatient Glycemic Control (2015)  Target Ranges:  Prepandial:   less than 140 mg/dL      Peak postprandial:   less than 180 mg/dL (1-2 hours)      Critically ill patients:  140 - 180 mg/dL   Lab Results  Component Value Date   GLUCAP 252 (H) 05/04/2023   HGBA1C 8.7 (H) 04/04/2023    Review of Glycemic Control  Latest Reference Range & Units 05/03/23 07:25 05/03/23 11:48 05/03/23 17:06 05/03/23 21:58 05/04/23 07:41  Glucose-Capillary 70 - 99 mg/dL 161 (H) 096 (H) 045 (H) 380 (H) 252 (H)   Diabetes history: DM2 Outpatient Diabetes medications: Farxiga 10 mg daily, Tradjenta 5 mg daily, Lantus 15 units daily Current orders for Inpatient glycemic control: Novolog 0-9 units TID, Decadron 4 mg QD  Inpatient Diabetes Program Recommendations:    Please consider adding Semglee 15 units daily (this was home dose of basal insulin).   Thanks,  Beryl Meager, RN, BC-ADM Inpatient Diabetes Coordinator Pager [phone]  (8a-5p)

## 2023-05-04 NOTE — Procedures (Signed)
Interventional Radiology Procedure Note  Procedure: CT guided neurolysis of celiac plexus  Complications: None  Estimated Blood Loss: None  Findings: 22 G Chiba needle advanced to level of celiac plexus from midline anterior approach. After confirming dilute contrast spread, injection performed of 5 mL of 0.5% Sensorcaine and 80 mg Depo-Medrol.  Neurolysis then performed with injection of 20 mL of absolute alcohol.  Procedure well tolerated under conscious sedation. Will follow to monitor level of pain relief.  Jodi Marble. Fredia Sorrow, M.D Pager:  [phone]

## 2023-05-04 NOTE — Progress Notes (Signed)
ANTICOAGULATION CONSULT NOTE - Follow Up Consult  Pharmacy Consult for heparin Indication: hx atrial fibrillation (PTA warfarin on hold)  Allergies  Allergen Reactions   Norco [Hydrocodone-Acetaminophen] Itching and Other (See Comments)    Tolerates Oxycodone   Hydrocodone Itching    Tolerates oxycodone     Patient Measurements: Height: 5\' 11"  (180.3 cm) Weight: 80.7 kg (178 lb) IBW/kg (Calculated) : 70.8 Heparin Dosing Weight: 80 kg  Vital Signs: Temp: 97.6 F (36.4 C) (05/29 0401) Temp Source: Oral (05/29 0401) BP: 132/83 (05/29 0401) Pulse Rate: 71 (05/29 0401)  Labs: Recent Labs    05/02/23 0751 05/02/23 1610 05/02/23 2157 05/03/23 0557 05/04/23 0552  HGB 14.4  --   --  13.3 12.2  HCT 43.4  --   --  39.1 36.2  PLT 168  --   --  177 147*  LABPROT 15.1  --   --  17.1* 18.8*  INR 1.2  --   --  1.4* 1.6*  HEPARINUNFRC 0.72*   < > 0.30 0.33 0.68  CREATININE 0.95  --   --   --   --    < > = values in this interval not displayed.     Estimated Creatinine Clearance: 67.7 mL/min (by C-G formula based on SCr of 0.95 mg/dL).   Medications:  - PTA warfarin regimen: 5mg  daily except 2.5mg  on Sundays  Assessment: Patient is a 62 y.o F with hx afib on warfarin PTA, who was recently diagnosed with pancreatic cancer and was supposed to have a liver biopsy and port placement on 05/03/23. She presented to the ED on 04/26/23 with c/o abdominal pain. Warfarin held on admission, INR was reversed with vit K and FFP and heparin started on 5/26 in anticipation for biopsy. Patient underwent port placement and liver lesion biopsy on 05/03/23 with heparin drip held prior to procedure and resumed back 4 hrs s/p procedure.  Today, 05/04/2023: - heparin level is therapeutic, but is at the upper end of goal range with 0.68. Per pt's RN, no issues with infusion and no bleeding noted - INR up 1.6 (warfarin has been on hold since adm); LFTs wnl on 5/27 - cbc stable   Significant events: -  5/24 INR 2.0, give FFP x2  units - 5/25: INR 2.2, give 5mg  IV Vit K - 5/26: INR 1, Start IV heparin - 5/28: liver lesion biopsy and port placement   Goal of Therapy:  Heparin level 0.3-0.7 units/ml Monitor platelets by anticoagulation protocol: Yes   Plan:  - Reduce heparin drip slightly to 650 units/hr - daily heparin level - monitor for s/sx bleeding  Katrell Milhorn P 05/04/2023,7:51 AM

## 2023-05-04 NOTE — Progress Notes (Signed)
MD notified for BG 490. She was hungry and had eaten heartily after returning from procedure today.  Orders received for additional insulin. MD states lab draw not necessary.  Counseled patient about high sugary foods and smoothies (not very receptive).

## 2023-05-04 NOTE — Progress Notes (Signed)
PROGRESS NOTE    [name]  WUJ:[id number] DOB: 12-21-1960 DOA: 04/26/2023 PCP: Chrys Racer, MD   Brief Narrative:  62 years old female with PMH significant for rheumatic heart disease s/p MV and TV repair in 04/2022, history of pericardial effusion / tamponade s/p pericardiocentesis in June 2023, PAF on Coumadin, CKD stage IIIa, COPD, type 2 diabetes, hypertension, tobacco use, recent admission for UTI and new diagnosis of pancreatic adenocarcinoma with possible liver and nodal metastases presented with worsening abdominal pain.  She was admitted for intractable abdominal pain.  Oncology/palliative care/IR were consulted.  She underwent liver biopsy and port placement by IR on 05/03/2023.  Assessment & Plan:   Intractable abdominal pain -In the setting of recently diagnosed metastatic pancreatic cancer -Palliative care following.  Continue current pain management regimen including scheduled OxyContin, gabapentin, Decadron along with as needed Dilaudid and oxycodone.  Continue bowel regimen -She is supposed to have celiac plexus nerve block today  Recent diagnosis of pancreatic cancer with possible liver and nodal metastasis -Had EUS on 04/07/2023 but biopsy was inconclusive.  Oncology recommended liver biopsy - underwent liver biopsy and port placement by IR on 05/03/2023.  Follow pathology.  Follow oncology recommendations  Paroxysmal A-fib -Currently rate controlled.  Continue metoprolol -Patient required reversal of Coumadin by FFP and vitamin K for IR procedure -Currently on heparin drip.  Monitor INR.  Coumadin on hold.  Will resume if okay with IR  Diabetes melitis type II with hyperglycemia Hyperlipidemia -Add long-acting insulin.  Continue CBGs with SSI -Continue statin  Hyponatremia -Mild.  Monitor  Thrombocytopenia -Mild.  Monitor  CKD stage IIIa -Creatinine at baseline.  Hypertension -Continue amlodipine, metoprolol and hydralazine.  Depression--continue  current regimen   DVT prophylaxis: Heparin drip Code Status: Full Family Communication: None at bedside Disposition Plan: Status is: Inpatient Remains inpatient appropriate because: Of severity of illness  Consultants: Oncology/IR/palliative care  Procedures: As above  Antimicrobials: None at bedside  Subjective: Patient seen and examined at bedside.  Complains of intermittent abdominal pain.  No fever or vomiting reported.  Objective: Vitals:   05/03/23 1525 05/03/23 1530 05/03/23 2028 05/04/23 0401  BP: (!) 147/103 (!) 146/102 131/82 132/83  Pulse:   80 71  Resp: 10 10 14 14   Temp:   98 F (36.7 C) 97.6 F (36.4 C)  TempSrc:   Oral Oral  SpO2:   100% 98%  Weight:      Height:        Intake/Output Summary (Last 24 hours) at 05/04/2023 1312 Last data filed at 05/04/2023 0043 Gross per 24 hour  Intake 274.71 ml  Output --  Net 274.71 ml   Filed Weights   04/26/23 1819  Weight: 80.7 kg    Examination:  General exam: Appears calm and comfortable.  Looks chronically ill and deconditioned.  On room air. Respiratory system: Bilateral decreased breath sounds at bases with scattered crackles Cardiovascular system: S1 & S2 heard, Rate controlled Gastrointestinal system: Abdomen is nondistended, soft and mildly tender. Normal bowel sounds heard. Extremities: No cyanosis, clubbing, edema  Central nervous system: Alert and oriented. No focal neurological deficits. Moving extremities Skin: No ecchymosis/lesions Psychiatry: Affect is flat.  Not agitated.    Data Reviewed: I have personally reviewed following labs and imaging studies  CBC: Recent Labs  Lab 04/28/23 0610 04/29/23 0548 05/02/23 0751 05/03/23 0557 05/04/23 0552  WBC 5.6 4.5 5.5 5.8 5.2  HGB 13.8 13.5 14.4 13.3 12.2  HCT 42.2 40.9 43.4 39.1 36.2  MCV  93.6 90.5 91.0 89.7 91.6  PLT 120* 117* 168 177 147*   Basic Metabolic Panel: Recent Labs  Lab 04/28/23 0610 04/29/23 0548 05/02/23 0751  NA  133* 132* 132*  K 4.0 4.3 3.6  CL 100 99 96*  CO2 22 25 26   GLUCOSE 149* 206* 260*  BUN 25* 27* 22  CREATININE 0.86 0.95 0.95  CALCIUM 8.4* 8.7* 8.7*  MG 1.7 1.9  --   PHOS 3.4 3.9  --    GFR: Estimated Creatinine Clearance: 67.7 mL/min (by C-G formula based on SCr of 0.95 mg/dL). Liver Function Tests: Recent Labs  Lab 05/02/23 0751  AST 23  ALT 24  ALKPHOS 96  BILITOT 0.6  PROT 7.1  ALBUMIN 3.8   No results for input(s): "LIPASE", "AMYLASE" in the last 168 hours. No results for input(s): "AMMONIA" in the last 168 hours. Coagulation Profile: Recent Labs  Lab 04/30/23 0706 05/01/23 0703 05/02/23 0751 05/03/23 0557 05/04/23 0552  INR 2.2* 1.0 1.2 1.4* 1.6*   Cardiac Enzymes: No results for input(s): "CKTOTAL", "CKMB", "CKMBINDEX", "TROPONINI" in the last 168 hours. BNP (last 3 results) Recent Labs    08/24/22 1555 12/01/22 1629  PROBNP 2,927* 2,487*   HbA1C: No results for input(s): "HGBA1C" in the last 72 hours. CBG: Recent Labs  Lab 05/03/23 1148 05/03/23 1706 05/03/23 2158 05/04/23 0741 05/04/23 1114  GLUCAP 192* 226* 380* 252* 227*   Lipid Profile: No results for input(s): "CHOL", "HDL", "LDLCALC", "TRIG", "CHOLHDL", "LDLDIRECT" in the last 72 hours. Thyroid Function Tests: No results for input(s): "TSH", "T4TOTAL", "FREET4", "T3FREE", "THYROIDAB" in the last 72 hours. Anemia Panel: No results for input(s): "VITAMINB12", "FOLATE", "FERRITIN", "TIBC", "IRON", "RETICCTPCT" in the last 72 hours. Sepsis Labs: No results for input(s): "PROCALCITON", "LATICACIDVEN" in the last 168 hours.  No results found for this or any previous visit (from the past 240 hour(s)).       Radiology Studies: US BIOPSY (LIVER)  Result Date: 05/03/2023 INDICATION: 62 year old with pancreatic mass and liver lesions. Probable metastatic pancreatic cancer. Port-A-Cath was placed immediately prior to this procedure. EXAM: ULTRASOUND-GUIDED LIVER LESION BIOPSY MEDICATIONS:  Moderate sedation ANESTHESIA/SEDATION: Moderate (conscious) sedation was employed during this procedure. A total of Versed 1 mg was administered intravenously by the radiology nurse. Additional Versed and fentanyl was given immediately prior to this procedure for the Port-A-Cath placement. Total intra-service moderate Sedation Time: 20 minutes. The patient's level of consciousness and vital signs were monitored continuously by radiology nursing throughout the procedure under my direct supervision. FLUOROSCOPY TIME:  None COMPLICATIONS: None immediate. PROCEDURE: Informed written consent was obtained from the patient after a thorough discussion of the procedural risks, benefits and alternatives. All questions were addressed. Maximal Sterile Barrier Technique was utilized including caps, mask, sterile gowns, sterile gloves, sterile drape, hand hygiene and skin antiseptic. A timeout was performed prior to the initiation of the procedure. Ultrasound demonstrated subtle hypoechoic liver lesions. Right hepatic lesion was targeted. Right side of the abdomen was prepped with chlorhexidine and sterile field was created. Skin was anesthetized using 1% lidocaine. Small incision was made. Using ultrasound guidance, 17 gauge coaxial needle was directed into the right hepatic lobe and a hypoechoic lesion. Three core biopsies were obtained with an 18 gauge core device. Specimens placed in formalin. 17 gauge needle was removed without complication. Bandage placed over the puncture site. FINDINGS: Subtle hypoechoic lesions in the liver. Core biopsy needle was confirmed within a small right hepatic lesion. No immediate bleeding or hematoma formation. IMPRESSION: Ultrasound-guided  core biopsy of a right hepatic lesion. Electronically Signed   By: Richarda Overlie M.D.   On: 05/03/2023 21:38   IR IMAGING GUIDED PORT INSERTION  Result Date: 05/03/2023 INDICATION: 62 year old with pancreatic mass and liver lesions. Probable metastatic  pancreatic cancer. Plan for placement of Port-A-Cath and liver lesion biopsy. EXAM: FLUOROSCOPIC AND ULTRASOUND GUIDED PLACEMENT OF A SUBCUTANEOUS PORT COMPARISON:  None Available. MEDICATIONS: Moderate sedation ANESTHESIA/SEDATION: Moderate (conscious) sedation was employed during this procedure. A total of Versed 1.75 mg and fentanyl 100 mcg was administered intravenously at the order of the provider performing the procedure. Total intra-service moderate sedation time: 29 minutes. Patient's level of consciousness and vital signs were monitored continuously by radiology nurse throughout the procedure under the supervision of the provider performing the procedure. FLUOROSCOPY TIME:  Radiation Exposure Index (as provided by the fluoroscopic device): 1 mGy Kerma COMPLICATIONS: None immediate. PROCEDURE: The procedure, risks, benefits, and alternatives were explained to the patient. Questions regarding the procedure were encouraged and answered. The patient understands and consents to the procedure. Patient was placed supine on the interventional table. Ultrasound confirmed a patent right internal jugular vein. Ultrasound image was saved for documentation. The right chest and neck were cleaned with a skin antiseptic and a sterile drape was placed. Maximal barrier sterile technique was utilized including caps, mask, sterile gowns, sterile gloves, sterile drape, hand hygiene and skin antiseptic. The right neck was anesthetized with 1% lidocaine. Small incision was made in the right neck with a blade. Micropuncture set was placed in the right internal jugular vein with ultrasound guidance. The micropuncture wire was used for measurement purposes. The right chest was anesthetized with 1% lidocaine with epinephrine. #15 blade was used to make an incision and a subcutaneous port pocket was formed. 8 french Power Port was assembled. Subcutaneous tunnel was formed with a stiff tunneling device. The port catheter was brought  through the subcutaneous tunnel. The port was placed in the subcutaneous pocket. The micropuncture set was exchanged for a peel-away sheath. The catheter was placed through the peel-away sheath and the tip was positioned at the superior cavoatrial junction. Catheter placement was confirmed with fluoroscopy. The port was accessed and flushed with saline. The port pocket was closed using two layers of absorbable sutures and Dermabond. The vein skin site was closed using a single layer of absorbable suture and Dermabond. Sterile dressings were applied. Patient tolerated the procedure well without an immediate complication. Ultrasound and fluoroscopic images were taken and saved for this procedure. IMPRESSION: Placement of a subcutaneous power-injectable port device. Catheter tip at the superior cavoatrial junction. Electronically Signed   By: Richarda Overlie M.D.   On: 05/03/2023 21:35        Scheduled Meds:  amLODipine  5 mg Oral Daily   ARIPiprazole  2.5 mg Oral Daily   atorvastatin  20 mg Oral Daily   buPROPion  300 mg Oral q AM   calamine  1 Application Topical QID   Chlorhexidine Gluconate Cloth  6 each Topical Daily   dexamethasone  4 mg Oral Daily   furosemide  40 mg Oral Daily   gabapentin  300 mg Oral TID   hydrALAZINE  25 mg Oral Q8H   insulin aspart  0-9 Units Subcutaneous TID WC   metoprolol tartrate  25 mg Oral BID   nicotine  21 mg Transdermal Daily   oxyCODONE  40 mg Oral Q8H   pantoprazole  40 mg Oral Daily   polyethylene glycol  17 g  Oral Daily   senna  2 tablet Oral Daily   vortioxetine HBr  10 mg Oral Daily   Continuous Infusions:  heparin Stopped (05/04/23 0826)          Glade Lloyd, MD [facility] 05/04/2023, 1:12 PM

## 2023-05-04 NOTE — Progress Notes (Signed)
MEDICATION-RELATED CONSULT NOTE   IR Procedure Consult - Anticoagulant/Antiplatelet PTA/Inpatient Med List Review by Pharmacist    Procedure: CT celiac plexus block    Completed: 5/29 at 1350  Post-Procedural bleeding risk per IR MD assessment:  standard  Antithrombotic medications on inpatient or PTA profile prior to procedure:   IV heparin at 650 units/hr      Plan:    Per Dr. Fredia Sorrow, ok to resume IV heparin today at 1500. Will restart heparin at previously planned rate of 650 units/hr before being held prior to this procedure and recheck heparin level in AM 5/30   Hessie Knows, PharmD, BCPS Secure Chat if ?s 05/04/2023 2:09 PM

## 2023-05-05 ENCOUNTER — Encounter: Payer: Self-pay | Admitting: Hematology

## 2023-05-05 ENCOUNTER — Other Ambulatory Visit: Payer: Self-pay | Admitting: Genetic Counselor

## 2023-05-05 ENCOUNTER — Other Ambulatory Visit (HOSPITAL_COMMUNITY): Payer: Self-pay

## 2023-05-05 DIAGNOSIS — R52 Pain, unspecified: Secondary | ICD-10-CM | POA: Diagnosis not present

## 2023-05-05 DIAGNOSIS — Z1379 Encounter for other screening for genetic and chromosomal anomalies: Secondary | ICD-10-CM

## 2023-05-05 DIAGNOSIS — C259 Malignant neoplasm of pancreas, unspecified: Secondary | ICD-10-CM | POA: Diagnosis not present

## 2023-05-05 LAB — CBC
HCT: 35.9 % — ABNORMAL LOW (ref 36.0–46.0)
Hemoglobin: 12.1 g/dL (ref 12.0–15.0)
MCH: 30.4 pg (ref 26.0–34.0)
MCHC: 33.7 g/dL (ref 30.0–36.0)
MCV: 90.2 fL (ref 80.0–100.0)
Platelets: 219 10*3/uL (ref 150–400)
RBC: 3.98 MIL/uL (ref 3.87–5.11)
RDW: 14.1 % (ref 11.5–15.5)
WBC: 8.3 10*3/uL (ref 4.0–10.5)
nRBC: 0 % (ref 0.0–0.2)

## 2023-05-05 LAB — BASIC METABOLIC PANEL
Anion gap: 8 (ref 5–15)
BUN: 24 mg/dL — ABNORMAL HIGH (ref 8–23)
CO2: 27 mmol/L (ref 22–32)
Calcium: 8.5 mg/dL — ABNORMAL LOW (ref 8.9–10.3)
Chloride: 97 mmol/L — ABNORMAL LOW (ref 98–111)
Creatinine, Ser: 0.92 mg/dL (ref 0.44–1.00)
GFR, Estimated: >60 mL/min (ref >60)
Glucose, Bld: 58 mg/dL — ABNORMAL LOW (ref 70–99)
Potassium: 3.6 mmol/L (ref 3.5–5.1)
Sodium: 132 mmol/L — ABNORMAL LOW (ref 135–145)

## 2023-05-05 LAB — GLUCOSE, CAPILLARY
Glucose-Capillary: 83 mg/dL (ref 70–99)
Glucose-Capillary: 167 mg/dL — ABNORMAL HIGH (ref 70–99)
Glucose-Capillary: 337 mg/dL — ABNORMAL HIGH (ref 70–99)
Glucose-Capillary: 442 mg/dL — ABNORMAL HIGH (ref 70–99)

## 2023-05-05 LAB — HEPARIN LEVEL (UNFRACTIONATED): Heparin Unfractionated: 0.41 IU/mL (ref 0.30–0.70)

## 2023-05-05 LAB — PROTIME-INR
INR: 1.5 — ABNORMAL HIGH (ref 0.8–1.2)
Prothrombin Time: 18 seconds — ABNORMAL HIGH (ref 11.4–15.2)

## 2023-05-05 LAB — MAGNESIUM: Magnesium: 1.6 mg/dL — ABNORMAL LOW (ref 1.7–2.4)

## 2023-05-05 MED ORDER — INSULIN ASPART 100 UNIT/ML IJ SOLN
6.0000 [IU] | Freq: Once | INTRAMUSCULAR | Status: AC
  Administered 2023-05-05: 6 [IU] via SUBCUTANEOUS

## 2023-05-05 MED ORDER — ONDANSETRON HCL 8 MG PO TABS
8.0000 mg | ORAL_TABLET | Freq: Three times a day (TID) | ORAL | 1 refills | Status: DC | PRN
Start: 2023-05-05 — End: 2023-05-12
  Filled 2023-05-05 – 2023-05-06 (×2): qty 30, 10d supply, fill #0

## 2023-05-05 MED ORDER — PROCHLORPERAZINE MALEATE 10 MG PO TABS
10.0000 mg | ORAL_TABLET | Freq: Four times a day (QID) | ORAL | 1 refills | Status: DC | PRN
Start: 2023-05-05 — End: 2023-05-12
  Filled 2023-05-05 – 2023-05-06 (×2): qty 30, 8d supply, fill #0

## 2023-05-05 MED ORDER — WARFARIN - PHARMACIST DOSING INPATIENT: Freq: Every day | Status: DC

## 2023-05-05 MED ORDER — LIDOCAINE-PRILOCAINE 2.5-2.5 % EX CREA
TOPICAL_CREAM | CUTANEOUS | 3 refills | Status: DC
Start: 2023-05-05 — End: 2023-05-12
  Filled 2023-05-05: qty 30, 15d supply, fill #0
  Filled 2023-05-06: qty 30, 30d supply, fill #0

## 2023-05-05 MED ORDER — INSULIN GLARGINE-YFGN 100 UNIT/ML ~~LOC~~ SOLN
10.0000 [IU] | Freq: Every day | SUBCUTANEOUS | Status: DC
  Administered 2023-05-05 – 2023-05-06 (×2): 10 [IU] via SUBCUTANEOUS
  Filled 2023-05-05 (×2): qty 0.1

## 2023-05-05 MED ORDER — WARFARIN SODIUM 5 MG PO TABS
7.5000 mg | ORAL_TABLET | Freq: Once | ORAL | Status: AC
  Administered 2023-05-05: 7.5 mg via ORAL
  Filled 2023-05-05: qty 1

## 2023-05-05 MED ORDER — GLUCERNA SHAKE PO LIQD
237.0000 mL | Freq: Three times a day (TID) | ORAL | Status: DC
  Administered 2023-05-05: 237 mL via ORAL
  Filled 2023-05-05 (×5): qty 237

## 2023-05-05 NOTE — Care Management Important Message (Signed)
Important Message  Patient Details IM Letter given Name: Thalia Gooley MRN: [id number] Date of Birth: [birth year]   Medicare Important Message Given:  Yes     Caren Macadam 05/05/2023, 9:04 AM

## 2023-05-05 NOTE — Progress Notes (Signed)
Mobility Specialist - Progress Note   05/05/23 1024  Mobility  Activity Ambulated with assistance in hallway  Level of Assistance Standby assist, set-up cues, supervision of patient - no hands on  Assistive Device Front wheel walker  Distance Ambulated (ft) 300 ft  Range of Motion/Exercises Active  Activity Response Tolerated well  Mobility Referral Yes  $Mobility charge 1 Mobility  Mobility Specialist Start Time (ACUTE ONLY) 1013  Mobility Specialist Stop Time (ACUTE ONLY) 1024  Mobility Specialist Time Calculation (min) (ACUTE ONLY) 11 min   Pt received in bed and agreed to mobility, had pain in abdomen that radiates to back 8/10. Forcing one standing rest break,  Returned to bed with all needs met and staff in room.  Marilynne Halsted Mobility Specialist

## 2023-05-05 NOTE — Progress Notes (Signed)
[name]   DOB:Jul 29, 1961   ZO#:[id number]   JWJ#:[id number]  Med/onc follow up   Subjective: Patient is overall stable, pain controlled, no new complains.    Objective:  Vitals:   05/05/23 1309 05/05/23 2137  BP: 111/89 125/87  Pulse: 83 80  Resp: 18 18  Temp: 98.2 F (36.8 C) 97.6 F (36.4 C)  SpO2: 98% 100%    Body mass index is 24.83 kg/m.  Intake/Output Summary (Last 24 hours) at 05/05/2023 2242 Last data filed at 05/05/2023 1545 Gross per 24 hour  Intake 480 ml  Output --  Net 480 ml     Sclerae unicteric  Oropharynx clear  No peripheral adenopathy  Lungs clear -- no rales or rhonchi  Heart regular rate and rhythm  Abdomen soft, with tenderness in the mid and upper abdomen  MSK no focal spinal tenderness, no peripheral edema  Neuro nonfocal    CBG (last 3)  Recent Labs    05/05/23 1134 05/05/23 1636 05/05/23 2141  GLUCAP 167* 337* 442*     Labs:   Urine Studies No results for input(s): "UHGB", "CRYS" in the last 72 hours.  Invalid input(s): "UACOL", "UAPR", "USPG", "UPH", "UTP", "UGL", "UKET", "UBIL", "UNIT", "UROB", "ULEU", "UEPI", "UWBC", "URBC", "UBAC", "CAST", "UCOM", "BILUA"  Basic Metabolic Panel: Recent Labs  Lab 04/29/23 0548 05/02/23 0751 05/05/23 0500  NA 132* 132* 132*  K 4.3 3.6 3.6  CL 99 96* 97*  CO2 25 26 27   GLUCOSE 206* 260* 58*  BUN 27* 22 24*  CREATININE 0.95 0.95 0.92  CALCIUM 8.7* 8.7* 8.5*  MG 1.9  --  1.6*  PHOS 3.9  --   --    GFR Estimated Creatinine Clearance: 70 mL/min (by C-G formula based on SCr of 0.92 mg/dL). Liver Function Tests: Recent Labs  Lab 05/02/23 0751  AST 23  ALT 24  ALKPHOS 96  BILITOT 0.6  PROT 7.1  ALBUMIN 3.8   No results for input(s): "LIPASE", "AMYLASE" in the last 168 hours.  No results for input(s): "AMMONIA" in the last 168 hours. Coagulation profile Recent Labs  Lab 05/01/23 0703 05/02/23 0751 05/03/23 0557 05/04/23 0552 05/05/23 0500  INR 1.0 1.2 1.4* 1.6*  1.5*    CBC: Recent Labs  Lab 04/29/23 0548 05/02/23 0751 05/03/23 0557 05/04/23 0552 05/05/23 0500  WBC 4.5 5.5 5.8 5.2 8.3  HGB 13.5 14.4 13.3 12.2 12.1  HCT 40.9 43.4 39.1 36.2 35.9*  MCV 90.5 91.0 89.7 91.6 90.2  PLT 117* 168 177 147* 219   Cardiac Enzymes: No results for input(s): "CKTOTAL", "CKMB", "CKMBINDEX", "TROPONINI" in the last 168 hours. BNP: Invalid input(s): "POCBNP" CBG: Recent Labs  Lab 05/04/23 2349 05/05/23 0739 05/05/23 1134 05/05/23 1636 05/05/23 2141  GLUCAP 251* 83 167* 337* 442*   D-Dimer No results for input(s): "DDIMER" in the last 72 hours. Hgb A1c No results for input(s): "HGBA1C" in the last 72 hours. Lipid Profile No results for input(s): "CHOL", "HDL", "LDLCALC", "TRIG", "CHOLHDL", "LDLDIRECT" in the last 72 hours. Thyroid function studies No results for input(s): "TSH", "T4TOTAL", "T3FREE", "THYROIDAB" in the last 72 hours.  Invalid input(s): "FREET3" Anemia work up No results for input(s): "VITAMINB12", "FOLATE", "FERRITIN", "TIBC", "IRON", "RETICCTPCT" in the last 72 hours. Microbiology No results found for this or any previous visit (from the past 240 hour(s)).    Studies:  CT CELIAC PLEXUS BLOCK NEUROLYTIC  Result Date: 05/04/2023 CLINICAL DATA:  Refractory abdominal pain secondary to metastatic pancreatic carcinoma. The patient presents for  celiac block with planned neurolysis of the celiac plexus. EXAM: CT GUIDED NEUROLYTIC ABLATION OF THE CELIAC AXIS ANESTHESIA/SEDATION: Moderate (conscious) sedation was employed during this procedure. A total of Versed 2.0 mg and Fentanyl 100 mcg was administered intravenously. Moderate Sedation Time: 23 minutes. The patient's level of consciousness and vital signs were monitored continuously by radiology nursing throughout the procedure under my direct supervision. PROCEDURE: The procedure risks, benefits, and alternatives were explained to the patient. Questions regarding the procedure were  encouraged and answered. The patient understands and consents to the procedure. A time-out was performed prior to initiating the procedure. CT was performed in a supine position through the upper to mid abdomen. The anterior abdominal wall was prepped with chlorhexidine in a sterile fashion, and a sterile drape was applied covering the operative field. A sterile gown and sterile gloves were used for the procedure. Local anesthesia was provided with 1% Lidocaine. A 22 gauge Chiba needle was advanced under CT guidance to the level of the celiac plexus. After confirming needle tip position, approximately three ml of a 1:10 dilution of Omnipaque-300 contrast and saline was injected. Spread of diluted contrast material was confirmed by CT. A mixture of 5 ml of 0.5% Sensorcaine and 80 mg Depo-Medrol was then made. This was injected through the needle. Alcohol neurolytic ablation of the celiac plexus was then performed with injection of 20 ml of absolute alcohol. COMPLICATIONS: None FINDINGS: Contrast injection showed excellent spread across the midline at the level of the celiac plexus. Alcohol ablation was successfully performed. IMPRESSION: CT guided neurolytic ablation of the celiac plexus as above. Alcohol ablation was performed with 20 ml of absolute alcohol. Therapeutic injection of Sensorcaine and Depo-Medrol was also performed just prior to alcohol ablation. Electronically Signed   By: Irish Lack M.D.   On: 05/04/2023 14:17    Assessment: 62 y.o. female   metastatic pancreatic cancer to liver Worsening abdominal pain, secondary to #1, s/p celiac nerve block Moderate protein and calorie malnutrition, anorexia Type 2 diabetes with neuropathy Atrial fibrillation, CHF CKD stage 3a COPD Anxiety and depression    Plan:  -I reviewed liver biopsy pathology with patient, which confirmed adenocarcinoma, consistent with metastatic pancreatic cancer. -I discussed the incurable nature of her metastatic  pancreatic cancer, and overall poor prognosis due to the aggressive nature of the disease -I recommend molecular testing of MMR if we have adequate tissue -I discussed treatment options.  Due to her multiple comorbidities, especially presented with heart disease, limited social support, and is low performance status, she is not a candidate for intensive chemotherapy such as FOLFIRINOX.  I recommend gemcitabine and Abraxane as first-line therapy, if she has poor tolerance to chemo, will change to single agent gemcitabine. --Chemotherapy consent: Side effects including but does not not limited to, fatigue, nausea, vomiting, diarrhea, hair loss, neuropathy, fluid retention, renal and kidney dysfunction, neutropenic fever, needed for blood transfusion, bleeding, were discussed with patient in great detail. She agrees to proceed. -The goal of chemotherapy is palliative, to prolong her life and improve her cancer related symptoms, especially pain. -She is scheduled to see me back next week, we will schedule her chemo class, and tentatively schedule chemotherapy for next week or the week of June 10   Malachy Mood, MD 05/05/2023  10:42 PM

## 2023-05-05 NOTE — Progress Notes (Signed)
PROGRESS NOTE    [name]  FAO:[id number] DOB: 1961/04/15 DOA: 04/26/2023 PCP: Chrys Racer, MD   Brief Narrative:  62 years old female with PMH significant for rheumatic heart disease s/p MV and TV repair in 04/2022, history of pericardial effusion / tamponade s/p pericardiocentesis in June 2023, PAF on Coumadin, CKD stage IIIa, COPD, type 2 diabetes, hypertension, tobacco use, recent admission for UTI and new diagnosis of pancreatic adenocarcinoma with possible liver and nodal metastases presented with worsening abdominal pain.  She was admitted for intractable abdominal pain.  Oncology/palliative care/IR were consulted.  She underwent liver biopsy and port placement by IR on 05/03/2023.  She underwent celiac plexus nerve block on 05/04/2023 by IR  Assessment & Plan:   Intractable abdominal pain -In the setting of recently diagnosed metastatic pancreatic cancer -Palliative care following.  Continue current pain management regimen including scheduled OxyContin, gabapentin, Decadron along with as needed Dilaudid and oxycodone.  Continue bowel regimen -Status post celiac plexus nerve block on 05/04/2023 by IR  Recent diagnosis of pancreatic cancer with possible liver and nodal metastasis -Had EUS on 04/07/2023 but biopsy was inconclusive.  Oncology recommended liver biopsy - underwent liver biopsy and port placement by IR on 05/03/2023.  Follow pathology.  Follow oncology recommendations  Paroxysmal A-fib -Currently rate controlled.  Continue metoprolol -Patient required reversal of Coumadin by FFP and vitamin K for IR procedure -Currently on heparin drip.  Monitor INR.  Coumadin on hold.  Will resume if okay with IR  Diabetes melitis type II with hyperglycemia and hypoglycemia Hyperlipidemia -Continue long-acting insulin but at a lower dose.  Continue CBGs with SSI.  Carb modified diet. -Continue statin  Hyponatremia -Mild.  Monitor  Hypomagnesemia -Replace.  Repeat a.m.  labs  Thrombocytopenia -Resolved  CKD stage IIIa -Creatinine at baseline.  Hypertension -Continue amlodipine, metoprolol and hydralazine.  Depression--continue current regimen   DVT prophylaxis: Heparin drip Code Status: Full Family Communication: None at bedside Disposition Plan: Status is: Inpatient Remains inpatient appropriate because: Of severity of illness  Consultants: Oncology/IR/palliative care  Procedures: As above  Antimicrobials: None at bedside  Subjective: Patient seen and examined at bedside.  No chest pain, shortness of breath, fever reported.  Still complains of intermittent abdominal pain but improving. Objective: Vitals:   05/04/23 1335 05/04/23 1409 05/04/23 2049 05/05/23 0552  BP: 122/62 116/71 (!) 129/90 122/87  Pulse: 67 65 82 73  Resp: 17 18  16   Temp:  98.4 F (36.9 C) 97.8 F (36.6 C) (!) 97.5 F (36.4 C)  TempSrc:  Oral Oral Oral  SpO2: 100% 98% 99% 99%  Weight:      Height:       No intake or output data in the 24 hours ending 05/05/23 0750  Filed Weights   04/26/23 1819  Weight: 80.7 kg    Examination:  General: On room air.  No distress.  Chronically ill and deconditioned looking ENT/neck: No thyromegaly.  JVD is not elevated  respiratory: Decreased breath sounds at bases bilaterally with some crackles; no wheezing  CVS: S1-S2 heard, rate controlled currently Abdominal: Soft, tender mildly, slightly distended; no organomegaly, bowel sounds are heard Extremities: Trace lower extremity edema; no cyanosis  CNS: Awake and alert.  No focal neurologic deficit.  Moves extremities Lymph: No obvious lymphadenopathy Skin: No obvious ecchymosis/lesions  psych: Currently not agitated.  Affect is mostly flat.   Musculoskeletal: No obvious joint swelling/deformity     Data Reviewed: I have personally reviewed following labs and imaging studies  CBC: Recent Labs  Lab 04/29/23 0548 05/02/23 0751 05/03/23 0557 05/04/23 0552  05/05/23 0500  WBC 4.5 5.5 5.8 5.2 8.3  HGB 13.5 14.4 13.3 12.2 12.1  HCT 40.9 43.4 39.1 36.2 35.9*  MCV 90.5 91.0 89.7 91.6 90.2  PLT 117* 168 177 147* 219    Basic Metabolic Panel: Recent Labs  Lab 04/29/23 0548 05/02/23 0751 05/05/23 0500  NA 132* 132* 132*  K 4.3 3.6 3.6  CL 99 96* 97*  CO2 25 26 27   GLUCOSE 206* 260* 58*  BUN 27* 22 24*  CREATININE 0.95 0.95 0.92  CALCIUM 8.7* 8.7* 8.5*  MG 1.9  --  1.6*  PHOS 3.9  --   --     GFR: Estimated Creatinine Clearance: 70 mL/min (by C-G formula based on SCr of 0.92 mg/dL). Liver Function Tests: Recent Labs  Lab 05/02/23 0751  AST 23  ALT 24  ALKPHOS 96  BILITOT 0.6  PROT 7.1  ALBUMIN 3.8    No results for input(s): "LIPASE", "AMYLASE" in the last 168 hours. No results for input(s): "AMMONIA" in the last 168 hours. Coagulation Profile: Recent Labs  Lab 05/01/23 0703 05/02/23 0751 05/03/23 0557 05/04/23 0552 05/05/23 0500  INR 1.0 1.2 1.4* 1.6* 1.5*    Cardiac Enzymes: No results for input(s): "CKTOTAL", "CKMB", "CKMBINDEX", "TROPONINI" in the last 168 hours. BNP (last 3 results) Recent Labs    08/24/22 1555 12/01/22 1629  PROBNP 2,927* 2,487*    HbA1C: No results for input(s): "HGBA1C" in the last 72 hours. CBG: Recent Labs  Lab 05/04/23 1656 05/04/23 1924 05/04/23 2136 05/04/23 2349 05/05/23 0739  GLUCAP 490* 492* 453* 251* 83    Lipid Profile: No results for input(s): "CHOL", "HDL", "LDLCALC", "TRIG", "CHOLHDL", "LDLDIRECT" in the last 72 hours. Thyroid Function Tests: No results for input(s): "TSH", "T4TOTAL", "FREET4", "T3FREE", "THYROIDAB" in the last 72 hours. Anemia Panel: No results for input(s): "VITAMINB12", "FOLATE", "FERRITIN", "TIBC", "IRON", "RETICCTPCT" in the last 72 hours. Sepsis Labs: No results for input(s): "PROCALCITON", "LATICACIDVEN" in the last 168 hours.  No results found for this or any previous visit (from the past 240 hour(s)).       Radiology  Studies: CT CELIAC PLEXUS BLOCK NEUROLYTIC  Result Date: 05/04/2023 CLINICAL DATA:  Refractory abdominal pain secondary to metastatic pancreatic carcinoma. The patient presents for celiac block with planned neurolysis of the celiac plexus. EXAM: CT GUIDED NEUROLYTIC ABLATION OF THE CELIAC AXIS ANESTHESIA/SEDATION: Moderate (conscious) sedation was employed during this procedure. A total of Versed 2.0 mg and Fentanyl 100 mcg was administered intravenously. Moderate Sedation Time: 23 minutes. The patient's level of consciousness and vital signs were monitored continuously by radiology nursing throughout the procedure under my direct supervision. PROCEDURE: The procedure risks, benefits, and alternatives were explained to the patient. Questions regarding the procedure were encouraged and answered. The patient understands and consents to the procedure. A time-out was performed prior to initiating the procedure. CT was performed in a supine position through the upper to mid abdomen. The anterior abdominal wall was prepped with chlorhexidine in a sterile fashion, and a sterile drape was applied covering the operative field. A sterile gown and sterile gloves were used for the procedure. Local anesthesia was provided with 1% Lidocaine. A 22 gauge Chiba needle was advanced under CT guidance to the level of the celiac plexus. After confirming needle tip position, approximately three ml of a 1:10 dilution of Omnipaque-300 contrast and saline was injected. Spread of diluted contrast material was confirmed  by CT. A mixture of 5 ml of 0.5% Sensorcaine and 80 mg Depo-Medrol was then made. This was injected through the needle. Alcohol neurolytic ablation of the celiac plexus was then performed with injection of 20 ml of absolute alcohol. COMPLICATIONS: None FINDINGS: Contrast injection showed excellent spread across the midline at the level of the celiac plexus. Alcohol ablation was successfully performed. IMPRESSION: CT guided  neurolytic ablation of the celiac plexus as above. Alcohol ablation was performed with 20 ml of absolute alcohol. Therapeutic injection of Sensorcaine and Depo-Medrol was also performed just prior to alcohol ablation. Electronically Signed   By: Irish Lack M.D.   On: 05/04/2023 14:17   US BIOPSY (LIVER)  Result Date: 05/03/2023 INDICATION: 62 year old with pancreatic mass and liver lesions. Probable metastatic pancreatic cancer. Port-A-Cath was placed immediately prior to this procedure. EXAM: ULTRASOUND-GUIDED LIVER LESION BIOPSY MEDICATIONS: Moderate sedation ANESTHESIA/SEDATION: Moderate (conscious) sedation was employed during this procedure. A total of Versed 1 mg was administered intravenously by the radiology nurse. Additional Versed and fentanyl was given immediately prior to this procedure for the Port-A-Cath placement. Total intra-service moderate Sedation Time: 20 minutes. The patient's level of consciousness and vital signs were monitored continuously by radiology nursing throughout the procedure under my direct supervision. FLUOROSCOPY TIME:  None COMPLICATIONS: None immediate. PROCEDURE: Informed written consent was obtained from the patient after a thorough discussion of the procedural risks, benefits and alternatives. All questions were addressed. Maximal Sterile Barrier Technique was utilized including caps, mask, sterile gowns, sterile gloves, sterile drape, hand hygiene and skin antiseptic. A timeout was performed prior to the initiation of the procedure. Ultrasound demonstrated subtle hypoechoic liver lesions. Right hepatic lesion was targeted. Right side of the abdomen was prepped with chlorhexidine and sterile field was created. Skin was anesthetized using 1% lidocaine. Small incision was made. Using ultrasound guidance, 17 gauge coaxial needle was directed into the right hepatic lobe and a hypoechoic lesion. Three core biopsies were obtained with an 18 gauge core device. Specimens  placed in formalin. 17 gauge needle was removed without complication. Bandage placed over the puncture site. FINDINGS: Subtle hypoechoic lesions in the liver. Core biopsy needle was confirmed within a small right hepatic lesion. No immediate bleeding or hematoma formation. IMPRESSION: Ultrasound-guided core biopsy of a right hepatic lesion. Electronically Signed   By: Richarda Overlie M.D.   On: 05/03/2023 21:38   IR IMAGING GUIDED PORT INSERTION  Result Date: 05/03/2023 INDICATION: 62 year old with pancreatic mass and liver lesions. Probable metastatic pancreatic cancer. Plan for placement of Port-A-Cath and liver lesion biopsy. EXAM: FLUOROSCOPIC AND ULTRASOUND GUIDED PLACEMENT OF A SUBCUTANEOUS PORT COMPARISON:  None Available. MEDICATIONS: Moderate sedation ANESTHESIA/SEDATION: Moderate (conscious) sedation was employed during this procedure. A total of Versed 1.75 mg and fentanyl 100 mcg was administered intravenously at the order of the provider performing the procedure. Total intra-service moderate sedation time: 29 minutes. Patient's level of consciousness and vital signs were monitored continuously by radiology nurse throughout the procedure under the supervision of the provider performing the procedure. FLUOROSCOPY TIME:  Radiation Exposure Index (as provided by the fluoroscopic device): 1 mGy Kerma COMPLICATIONS: None immediate. PROCEDURE: The procedure, risks, benefits, and alternatives were explained to the patient. Questions regarding the procedure were encouraged and answered. The patient understands and consents to the procedure. Patient was placed supine on the interventional table. Ultrasound confirmed a patent right internal jugular vein. Ultrasound image was saved for documentation. The right chest and neck were cleaned with a skin antiseptic and a  sterile drape was placed. Maximal barrier sterile technique was utilized including caps, mask, sterile gowns, sterile gloves, sterile drape, hand  hygiene and skin antiseptic. The right neck was anesthetized with 1% lidocaine. Small incision was made in the right neck with a blade. Micropuncture set was placed in the right internal jugular vein with ultrasound guidance. The micropuncture wire was used for measurement purposes. The right chest was anesthetized with 1% lidocaine with epinephrine. #15 blade was used to make an incision and a subcutaneous port pocket was formed. 8 french Power Port was assembled. Subcutaneous tunnel was formed with a stiff tunneling device. The port catheter was brought through the subcutaneous tunnel. The port was placed in the subcutaneous pocket. The micropuncture set was exchanged for a peel-away sheath. The catheter was placed through the peel-away sheath and the tip was positioned at the superior cavoatrial junction. Catheter placement was confirmed with fluoroscopy. The port was accessed and flushed with saline. The port pocket was closed using two layers of absorbable sutures and Dermabond. The vein skin site was closed using a single layer of absorbable suture and Dermabond. Sterile dressings were applied. Patient tolerated the procedure well without an immediate complication. Ultrasound and fluoroscopic images were taken and saved for this procedure. IMPRESSION: Placement of a subcutaneous power-injectable port device. Catheter tip at the superior cavoatrial junction. Electronically Signed   By: Richarda Overlie M.D.   On: 05/03/2023 21:35        Scheduled Meds:  amLODipine  5 mg Oral Daily   ARIPiprazole  2.5 mg Oral Daily   atorvastatin  20 mg Oral Daily   buPROPion  300 mg Oral q AM   calamine  1 Application Topical QID   Chlorhexidine Gluconate Cloth  6 each Topical Daily   dexamethasone  4 mg Oral Daily   furosemide  40 mg Oral Daily   gabapentin  300 mg Oral TID   hydrALAZINE  25 mg Oral Q8H   insulin aspart  0-9 Units Subcutaneous TID WC   insulin glargine-yfgn  15 Units Subcutaneous Daily    metoprolol tartrate  25 mg Oral BID   nicotine  21 mg Transdermal Daily   oxyCODONE  40 mg Oral Q8H   pantoprazole  40 mg Oral Daily   polyethylene glycol  17 g Oral Daily   senna  2 tablet Oral Daily   vortioxetine HBr  10 mg Oral Daily   Continuous Infusions:  heparin 650 Units/hr (05/04/23 1545)          Glade Lloyd, MD [facility] 05/05/2023, 7:50 AM

## 2023-05-05 NOTE — Progress Notes (Signed)
START ON PATHWAY REGIMEN - Pancreatic Adenocarcinoma     A cycle is every 28 days:     Nab-paclitaxel (protein bound)      Gemcitabine   **Always confirm dose/schedule in your pharmacy ordering system**  Patient Characteristics: Metastatic Disease, First Line, PS  ?  2, BRCA1/2 and PALB2 Mutation Absent/Unknown Therapeutic Status: Metastatic Disease Line of Therapy: First Line ECOG Performance Status: 2 BRCA1/2 Mutation Status: Awaiting Test Results PALB2 Mutation Status: Awaiting Test Results Intent of Therapy: Non-Curative / Palliative Intent, Discussed with Patient 

## 2023-05-05 NOTE — Progress Notes (Addendum)
ANTICOAGULATION CONSULT NOTE - Follow Up Consult  Pharmacy Consult for warfarin Indication: hx atrial fibrillation  Allergies  Allergen Reactions   Norco [Hydrocodone-Acetaminophen] Itching and Other (See Comments)    Tolerates Oxycodone   Hydrocodone Itching    Tolerates oxycodone     Patient Measurements: Height: 5\' 11"  (180.3 cm) Weight: 80.7 kg (178 lb) IBW/kg (Calculated) : 70.8 Heparin Dosing Weight: 80 kg  Vital Signs: Temp: 97.5 F (36.4 C) (05/30 0552) Temp Source: Oral (05/30 0552) BP: 122/87 (05/30 0552) Pulse Rate: 73 (05/30 0552)  Labs: Recent Labs    05/03/23 0557 05/04/23 0552 05/05/23 0500  HGB 13.3 12.2 12.1  HCT 39.1 36.2 35.9*  PLT 177 147* 219  LABPROT 17.1* 18.8* 18.0*  INR 1.4* 1.6* 1.5*  HEPARINUNFRC 0.33 0.68 0.41  CREATININE  --   --  0.92     Estimated Creatinine Clearance: 70 mL/min (by C-G formula based on SCr of 0.92 mg/dL).   Medications:  - PTA warfarin regimen: 5mg  daily except 2.5mg  on Sundays  Assessment: Patient is a 62 y.o F with hx afib on warfarin PTA, who was recently diagnosed with pancreatic cancer and was supposed to have a liver biopsy and port placement on 05/03/23. She presented to the ED on 04/26/23 with c/o abdominal pain. Warfarin held on admission, INR was reversed with vit K and FFP and heparin started on 5/26 in anticipation for biopsy. Patient underwent port placement and liver lesion biopsy on 05/03/23 with heparin drip held prior to procedure and resumed back 4 hrs s/p procedure.  Today, 05/05/2023: - heparin level is therapeutic on heparin at 650 units/hr - INR 1.5 - elevated baseline INR? Warfarin held on admission 5/21 and s/p Vit K, so would expect INR to be normal at this point - CBC stable   Significant events: - 5/24 INR 2.0, give FFP x2 units - 5/25: INR 2.2, give 5mg  IV Vit K - 5/26: INR 1, Start IV heparin - 5/28: liver lesion biopsy and port placement  - 5/29: celiac plexus block - 5/30: okay  to resume warfarin per IR  Goal of Therapy:  Heparin level 0.3-0.7 units/ml Monitor platelets by anticoagulation protocol: Yes   Plan:  - Discussed with MD - no need for bridging, discontinue heparin infusion - Warfarin 7.5 mg today (1.5x home dose given INR already somewhat elevated) - Daily INR for now - Monitor for s/sx bleeding  Pricilla Riffle, PharmD, BCPS Clinical Pharmacist 05/05/2023 11:05 AM

## 2023-05-05 NOTE — Evaluation (Signed)
Physical Therapy Evaluation Only Patient Details Name: [name] MRN: [id number] DOB: June 15, 1961 Today's Date: 05/05/2023  History of Present Illness  Pt is a 62 yo female admitted with intractable pain. PMH: rheumatic heart disease s/p MV and TV repair in 04/2022, history of pericardial effusion / tamponade s/p pericardiocentesis in June 2023, PAF on Coumadin, CKD stage IIIa, COPD, type 2 diabetes, hypertension, tobacco use, recent admission for UTI and new diagnosis of pancreatic adenocarcinoma with possible liver and nodal metastases  Clinical Impression  Pt from home using rollator in the home majority of the time, also has w/c that she uses when feeling dizzy and not wanting to ambulate. Pt reports ind with amb, self care, light household chores. Pt reports chronic pain that she takes rest breaks while mobilizing. Pt currently performing transfers with RW modified ind from EOB and toilet. Pt amb with RW around room and in hallway with supv, good steadiness, needing 2 standing rest breaks due to abdominal pain. Pt does report ~2 falls in the last 6 months and she was able to get up on her own, also reports multiple family members present who can assist PRN if needed. Pt endorses ambulating with nursing and mobility specialists while in hospital. Pt is at her baseline, no acute PT needs identified, ancipitate no f/u needs at d/c.     Recommendations for follow up therapy are one component of a multi-disciplinary discharge planning process, led by the attending physician.  Recommendations may be updated based on patient status, additional functional criteria and insurance authorization.  Follow Up Recommendations       Assistance Recommended at Discharge PRN  Patient can return home with the following  Assistance with cooking/housework    Equipment Recommendations None recommended by PT  Recommendations for Other Services       Functional Status Assessment Patient has not had a  recent decline in their functional status     Precautions / Restrictions Precautions Precautions: Fall Restrictions Weight Bearing Restrictions: No      Mobility  Bed Mobility Overal bed mobility: Independent                  Transfers Overall transfer level: Modified independent Equipment used: Rolling walker (2 wheels)                    Ambulation/Gait Ambulation/Gait assistance: Supervision Gait Distance (Feet): 150 Feet Assistive device: Rolling walker (2 wheels) Gait Pattern/deviations: Step-through pattern, Decreased stride length Gait velocity: slightly decreased     General Gait Details: pt ambulates with step through gait pattern, good steadiness, 2 standing rests due to abdominal pain, able to complete turns and navigate around obstacles without LOB  Stairs            Wheelchair Mobility    Modified Rankin (Stroke Patients Only)       Balance Overall balance assessment: No apparent balance deficits (not formally assessed)                                           Pertinent Vitals/Pain Pain Assessment Pain Assessment: 0-10 Pain Score: 8  Pain Location: stomach Pain Descriptors / Indicators: Constant, Sharp Pain Intervention(s): Limited activity within patient's tolerance, Monitored during session, RN gave pain meds during session    Home Living Family/patient expects to be discharged to:: Private residence Living Arrangements: Alone Available Help at Discharge:  Available PRN/intermittently Type of Home: Apartment Home Access: Level entry       Home Layout: One level Home Equipment: Rollator (4 wheels);Cane - single point;BSC/3in1;Wheelchair - manual;Grab bars - tub/shower;Shower seat;Hand held shower head      Prior Function Prior Level of Function : Independent/Modified Independent;Driving             Mobility Comments: pt reports using rollator mostly in the home, using w/c when feeling dizzy,  uses electric scooter in the grocery store ADLs Comments: pt reports sponge bathing, ind with self care, light housework     Hand Dominance   Dominant Hand: Right    Extremity/Trunk Assessment   Upper Extremity Assessment Upper Extremity Assessment: Overall WFL for tasks assessed    Lower Extremity Assessment Lower Extremity Assessment: Overall WFL for tasks assessed    Cervical / Trunk Assessment Cervical / Trunk Assessment: Normal  Communication   Communication: No difficulties  Cognition Arousal/Alertness: Awake/alert Behavior During Therapy: WFL for tasks assessed/performed Overall Cognitive Status: Within Functional Limits for tasks assessed                                          General Comments      Exercises     Assessment/Plan    PT Assessment Patient does not need any further PT services  PT Problem List         PT Treatment Interventions      PT Goals (Current goals can be found in the Care Plan section)  Acute Rehab PT Goals Patient Stated Goal: return home PT Goal Formulation: All assessment and education complete, DC therapy    Frequency       Co-evaluation               AM-PAC PT "6 Clicks" Mobility  Outcome Measure Help needed turning from your back to your side while in a flat bed without using bedrails?: None Help needed moving from lying on your back to sitting on the side of a flat bed without using bedrails?: None Help needed moving to and from a bed to a chair (including a wheelchair)?: None Help needed standing up from a chair using your arms (e.g., wheelchair or bedside chair)?: None Help needed to walk in hospital room?: A Little Help needed climbing 3-5 steps with a railing? : A Little 6 Click Score: 22    End of Session Equipment Utilized During Treatment: Gait belt Activity Tolerance: Patient tolerated treatment well Patient left: in bed;with call bell/phone within reach;with bed alarm set Nurse  Communication: Mobility status PT Visit Diagnosis: Pain Pain - part of body:  (abdominal)    Time: 1610-9604 PT Time Calculation (min) (ACUTE ONLY): 22 min   Charges:   PT Evaluation $PT Eval Low Complexity: 1 Low          Tori Marvin Grabill PT, DPT 05/05/23, 12:52 PM

## 2023-05-05 NOTE — Progress Notes (Signed)
Patient ID: [name], female   DOB: [birth year], 62 y.o.   MRN: [id number]    Referring Physician(s): Feng,Y  Supervising Physician: Simonne Come  Patient Status:  [facility] - In-pt  Chief Complaint:  Abdominal pain, metastatic pancreatic cancer  Subjective: Pt states that her abd pain has improved slightly since celiac plexus neurolysis performed yesterday; denies fever, N/V   Allergies: Norco [hydrocodone-acetaminophen] and Hydrocodone  Medications: Prior to Admission medications   Medication Sig Start Date End Date Taking? Authorizing Provider  ARIPiprazole (ABILIFY) 5 MG tablet Take 2.5 mg by mouth daily. 07/20/22  Yes [provider]  atorvastatin (LIPITOR) 20 MG tablet Take 20 mg by mouth daily.   Yes [provider]  buPROPion (WELLBUTRIN XL) 300 MG 24 hr tablet Take 300 mg by mouth in the morning. 03/24/22  Yes [provider]  dexlansoprazole (DEXILANT) 60 MG capsule Take 60 mg by mouth daily. 07/26/22  Yes [provider]  diclofenac Sodium (VOLTAREN) 1 % GEL Apply 2 g topically 4 (four) times daily as needed (pain). 06/29/17  Yes [provider]  famotidine (PEPCID) 20 MG tablet Take 20 mg by mouth daily.   Yes [provider]  FARXIGA 10 MG TABS tablet Take 10 mg by mouth daily. 08/10/22  Yes [provider]  FLUoxetine (PROZAC) 20 MG capsule Take 20 mg by mouth daily. 09/20/22  Yes [provider]  furosemide (LASIX) 40 MG tablet Take 1 tablet (40 mg total) by mouth daily. 02/28/23  Yes Chandrasekhar, Mahesh A, MD  gabapentin (NEURONTIN) 300 MG capsule Take 300 mg by mouth 3 (three) times daily. 08/19/22  Yes [provider]  hydrALAZINE (APRESOLINE) 25 MG tablet Take 1 tablet (25 mg total) by mouth daily as needed (BP>150). 04/26/23  Yes Chandrasekhar, Mahesh A, MD  insulin glargine (LANTUS) 100 UNIT/ML Solostar Pen Inject 15 Units into the skin daily. 04/09/23  Yes Glade Lloyd, MD  linagliptin  (TRADJENTA) 5 MG TABS tablet Take 5 mg by mouth daily.   Yes [provider]  metoprolol tartrate (LOPRESSOR) 50 MG tablet Take 50 mg by mouth 2 (two) times daily.   Yes [provider]  morphine (MS CONTIN) 30 MG 12 hr tablet Take 1 tablet (30 mg total) by mouth every 12 (twelve) hours. 04/25/23  Yes Pollyann Samples, NP  omeprazole (PRILOSEC) 10 MG capsule Take 10 mg by mouth daily.   Yes [provider]  oxyCODONE (ROXICODONE) 15 MG immediate release tablet Take 1 tablet (15 mg total) by mouth every 6 (six) hours as needed for pain. 04/25/23  Yes Pollyann Samples, NP  PREMARIN 0.45 MG tablet Take 0.45 mg by mouth daily. 01/11/22  Yes [provider]  QUEtiapine (SEROQUEL) 25 MG tablet Take 25 mg by mouth as needed. 03/17/23  Yes [provider]  TIADYLT ER 300 MG 24 hr capsule Take 300 mg by mouth daily. 03/11/23  Yes [provider]  tobramycin-dexamethasone (TOBRADEX) ophthalmic solution 1 drop 4 (four) times daily. 04/14/23  Yes [provider]  traZODone (DESYREL) 100 MG tablet Take 200 mg by mouth at bedtime. Pt takes 2 tablets 200 mg at bedtime.   Yes [provider]  TRINTELLIX 10 MG TABS tablet Take 10 mg by mouth daily. 10/22/22  Yes [provider]  Blood Glucose Monitoring Suppl DEVI 1 each by Does not apply route in the morning, at noon, and at bedtime. May substitute to any manufacturer covered by patient's insurance. Patient not taking:  Reported on 04/27/2023 04/09/23   Glade Lloyd, MD  Glucose Blood (BLOOD GLUCOSE TEST STRIPS) STRP 1 each by In Vitro route in the morning, at noon, and at bedtime. May substitute to any manufacturer covered by patient's insurance. Patient not taking: Reported on 04/27/2023 04/09/23   Glade Lloyd, MD  Insulin Pen Needle 31G X 5 MM MISC Lantus 15 units daily Patient not taking: Reported on 04/27/2023 04/09/23   Glade Lloyd, MD  Lancet Device MISC 1 each by Does not apply route in the  morning, at noon, and at bedtime. May substitute to any manufacturer covered by patient's insurance. Patient not taking: Reported on 04/27/2023 04/09/23 05/09/23  Glade Lloyd, MD  Lancets Misc. MISC 1 each by Does not apply route in the morning, at noon, and at bedtime. May substitute to any manufacturer covered by patient's insurance. Patient not taking: Reported on 04/27/2023 04/09/23 05/09/23  Glade Lloyd, MD  naloxone [facility]) nasal spray 4 mg/0.1 mL PRN for suspected opiate OD Patient not taking: Reported on 04/27/2023 04/24/23   Glyn Ade, MD  Ethelda Chick (OYSTER CALCIUM) 500 MG TABS tablet Take 500 mg of elemental calcium by mouth daily. Patient not taking: Reported on 04/27/2023    [provider]  warfarin (COUMADIN) 5 MG tablet TAKE 1 TABLET BY MOUTH DAILY EXCEPT 1.5 TABLETS BY MOUTH ON SUNDAYS AS DIRECTED BY COUMADIN CLINIC 05/03/23   Christell Constant, MD     Vital Signs: BP 122/87 (BP Location: Right Arm)   Pulse 73   Temp (!) 97.5 F (36.4 C) (Oral)   Resp 16   Ht 5\' 11"  (1.803 m)   Wt 178 lb (80.7 kg)   LMP 12/06/1976   SpO2 99%   BMI 24.83 kg/m   Physical Exam awake/alert; puncture site upper mid region clean and dry,mildly tender, no hematoma  Imaging: CT CELIAC PLEXUS BLOCK NEUROLYTIC  Result Date: 05/04/2023 CLINICAL DATA:  Refractory abdominal pain secondary to metastatic pancreatic carcinoma. The patient presents for celiac block with planned neurolysis of the celiac plexus. EXAM: CT GUIDED NEUROLYTIC ABLATION OF THE CELIAC AXIS ANESTHESIA/SEDATION: Moderate (conscious) sedation was employed during this procedure. A total of Versed 2.0 mg and Fentanyl 100 mcg was administered intravenously. Moderate Sedation Time: 23 minutes. The patient's level of consciousness and vital signs were monitored continuously by radiology nursing throughout the procedure under my direct supervision. PROCEDURE: The procedure risks, benefits, and alternatives were  explained to the patient. Questions regarding the procedure were encouraged and answered. The patient understands and consents to the procedure. A time-out was performed prior to initiating the procedure. CT was performed in a supine position through the upper to mid abdomen. The anterior abdominal wall was prepped with chlorhexidine in a sterile fashion, and a sterile drape was applied covering the operative field. A sterile gown and sterile gloves were used for the procedure. Local anesthesia was provided with 1% Lidocaine. A 22 gauge Chiba needle was advanced under CT guidance to the level of the celiac plexus. After confirming needle tip position, approximately three ml of a 1:10 dilution of Omnipaque-300 contrast and saline was injected. Spread of diluted contrast material was confirmed by CT. A mixture of 5 ml of 0.5% Sensorcaine and 80 mg Depo-Medrol was then made. This was injected through the needle. Alcohol neurolytic ablation of the celiac plexus was then performed with injection of 20 ml of absolute alcohol. COMPLICATIONS: None FINDINGS: Contrast injection showed excellent spread across the midline at the level of the celiac  plexus. Alcohol ablation was successfully performed. IMPRESSION: CT guided neurolytic ablation of the celiac plexus as above. Alcohol ablation was performed with 20 ml of absolute alcohol. Therapeutic injection of Sensorcaine and Depo-Medrol was also performed just prior to alcohol ablation. Electronically Signed   By: Irish Lack M.D.   On: 05/04/2023 14:17   US BIOPSY (LIVER)  Result Date: 05/03/2023 INDICATION: 62 year old with pancreatic mass and liver lesions. Probable metastatic pancreatic cancer. Port-A-Cath was placed immediately prior to this procedure. EXAM: ULTRASOUND-GUIDED LIVER LESION BIOPSY MEDICATIONS: Moderate sedation ANESTHESIA/SEDATION: Moderate (conscious) sedation was employed during this procedure. A total of Versed 1 mg was administered intravenously  by the radiology nurse. Additional Versed and fentanyl was given immediately prior to this procedure for the Port-A-Cath placement. Total intra-service moderate Sedation Time: 20 minutes. The patient's level of consciousness and vital signs were monitored continuously by radiology nursing throughout the procedure under my direct supervision. FLUOROSCOPY TIME:  None COMPLICATIONS: None immediate. PROCEDURE: Informed written consent was obtained from the patient after a thorough discussion of the procedural risks, benefits and alternatives. All questions were addressed. Maximal Sterile Barrier Technique was utilized including caps, mask, sterile gowns, sterile gloves, sterile drape, hand hygiene and skin antiseptic. A timeout was performed prior to the initiation of the procedure. Ultrasound demonstrated subtle hypoechoic liver lesions. Right hepatic lesion was targeted. Right side of the abdomen was prepped with chlorhexidine and sterile field was created. Skin was anesthetized using 1% lidocaine. Small incision was made. Using ultrasound guidance, 17 gauge coaxial needle was directed into the right hepatic lobe and a hypoechoic lesion. Three core biopsies were obtained with an 18 gauge core device. Specimens placed in formalin. 17 gauge needle was removed without complication. Bandage placed over the puncture site. FINDINGS: Subtle hypoechoic lesions in the liver. Core biopsy needle was confirmed within a small right hepatic lesion. No immediate bleeding or hematoma formation. IMPRESSION: Ultrasound-guided core biopsy of a right hepatic lesion. Electronically Signed   By: Richarda Overlie M.D.   On: 05/03/2023 21:38   IR IMAGING GUIDED PORT INSERTION  Result Date: 05/03/2023 INDICATION: 62 year old with pancreatic mass and liver lesions. Probable metastatic pancreatic cancer. Plan for placement of Port-A-Cath and liver lesion biopsy. EXAM: FLUOROSCOPIC AND ULTRASOUND GUIDED PLACEMENT OF A SUBCUTANEOUS PORT COMPARISON:   None Available. MEDICATIONS: Moderate sedation ANESTHESIA/SEDATION: Moderate (conscious) sedation was employed during this procedure. A total of Versed 1.75 mg and fentanyl 100 mcg was administered intravenously at the order of the provider performing the procedure. Total intra-service moderate sedation time: 29 minutes. Patient's level of consciousness and vital signs were monitored continuously by radiology nurse throughout the procedure under the supervision of the provider performing the procedure. FLUOROSCOPY TIME:  Radiation Exposure Index (as provided by the fluoroscopic device): 1 mGy Kerma COMPLICATIONS: None immediate. PROCEDURE: The procedure, risks, benefits, and alternatives were explained to the patient. Questions regarding the procedure were encouraged and answered. The patient understands and consents to the procedure. Patient was placed supine on the interventional table. Ultrasound confirmed a patent right internal jugular vein. Ultrasound image was saved for documentation. The right chest and neck were cleaned with a skin antiseptic and a sterile drape was placed. Maximal barrier sterile technique was utilized including caps, mask, sterile gowns, sterile gloves, sterile drape, hand hygiene and skin antiseptic. The right neck was anesthetized with 1% lidocaine. Small incision was made in the right neck with a blade. Micropuncture set was placed in the right internal jugular vein with ultrasound guidance. The micropuncture  wire was used for measurement purposes. The right chest was anesthetized with 1% lidocaine with epinephrine. #15 blade was used to make an incision and a subcutaneous port pocket was formed. 8 french Power Port was assembled. Subcutaneous tunnel was formed with a stiff tunneling device. The port catheter was brought through the subcutaneous tunnel. The port was placed in the subcutaneous pocket. The micropuncture set was exchanged for a peel-away sheath. The catheter was placed  through the peel-away sheath and the tip was positioned at the superior cavoatrial junction. Catheter placement was confirmed with fluoroscopy. The port was accessed and flushed with saline. The port pocket was closed using two layers of absorbable sutures and Dermabond. The vein skin site was closed using a single layer of absorbable suture and Dermabond. Sterile dressings were applied. Patient tolerated the procedure well without an immediate complication. Ultrasound and fluoroscopic images were taken and saved for this procedure. IMPRESSION: Placement of a subcutaneous power-injectable port device. Catheter tip at the superior cavoatrial junction. Electronically Signed   By: Richarda Overlie M.D.   On: 05/03/2023 21:35    Labs:  CBC: Recent Labs    05/02/23 0751 05/03/23 0557 05/04/23 0552 05/05/23 0500  WBC 5.5 5.8 5.2 8.3  HGB 14.4 13.3 12.2 12.1  HCT 43.4 39.1 36.2 35.9*  PLT 168 177 147* 219    COAGS: Recent Labs    05/28/22 1639 05/29/22 0516 05/02/23 0751 05/03/23 0557 05/04/23 0552 05/05/23 0500  INR 2.6*   < > 1.2 1.4* 1.6* 1.5*  APTT 39*  --   --   --   --   --    < > = values in this interval not displayed.    BMP: Recent Labs    04/28/23 0610 04/29/23 0548 05/02/23 0751 05/05/23 0500  NA 133* 132* 132* 132*  K 4.0 4.3 3.6 3.6  CL 100 99 96* 97*  CO2 22 25 26 27   GLUCOSE 149* 206* 260* 58*  BUN 25* 27* 22 24*  CALCIUM 8.4* 8.7* 8.7* 8.5*  CREATININE 0.86 0.95 0.95 0.92  GFRNONAA >60 >60 >60 >60    LIVER FUNCTION TESTS: Recent Labs    04/04/23 0746 04/24/23 1756 04/26/23 1914 05/02/23 0751  BILITOT 0.7 0.5 0.9 0.6  AST 13* 19 20 23   ALT 17 19 18 24   ALKPHOS 63 76 87 96  PROT 6.4* 6.5 7.4 7.1  ALBUMIN 3.9 3.7 3.8 3.8    Assessment and Plan: Pt with hx intractable abd pain, metastatic pancreatic cancer; s/p CT guided celiac plexus neurolysis 5/29 with some relief of abd pain; afebrile, BP ok, WBC nl, hgb nl, creat nl; cont current tx as per  TRH/oncology/pall care   Electronically Signed: D. Jeananne Rama, PA-C 05/05/2023, 12:25 PM   I spent a total of 15 Minutes at the the patient's bedside AND on the patient's hospital floor or unit, greater than 50% of which was counseling/coordinating care for celiac plexus neurolysis

## 2023-05-05 NOTE — Progress Notes (Signed)
Daily Progress Note   Patient Name: [name]       Date: 05/05/2023 DOB: May 04, 1961  Age: 62 y.o. MRN#: [id number] Attending Physician: Glade Lloyd, MD Primary Care Physician: Chrys Racer, MD Admit Date: 04/26/2023 Length of Stay: 7 days  Reason for Consultation/Follow-up: Establishing goals of care and Symptom Management  Subjective:   CC: Patient reports feeling pain continues to slowly improve.  Following up regarding complex medical decision making and symptom management.   Subjective:  Patient history reviewed Patient states she is happy that all of her procedures are done, she underwent liver biopsy, port placement and also celiac plexus nerve block.     Review of Systems Abdominal pain improving, although at times, still uncontrolled.  Objective:   Vital Signs:  BP 122/87 (BP Location: Right Arm)   Pulse 73   Temp (!) 97.5 F (36.4 C) (Oral)   Resp 16   Ht 5\' 11"  (1.803 m)   Wt 80.7 kg   LMP 12/06/1976   SpO2 99%   BMI 24.83 kg/m   Physical Exam: General: NAD, alert, laying in bed, chronically ill-appearing, pleasant Eyes: No drainage noted HENT: moist mucous membranes Cardiovascular: RRR Respiratory: no increased work of breathing noted, not in respiratory distress Abdomen: distended Skin: no rashes or lesions on visible skin Neuro: A&Ox4, following commands easily Psych: appropriately answers all questions   Imaging:  I personally reviewed recent imaging.   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  Patient has already undergone EUS with GI on 5/2 though pathology unfortunately was not nonconclusive.  Oncology following for pathology.  IR has been consulted to obtain another liver biopsy.  Palliative medicine team consulted to assist with complex medical decision making and pain management.   Recommendations/Plan: # Complex medical decision making/goals of care:  -Patient continuing with current cancer directed workup and therapies.   -Willing to meet with chaplain to discuss HCPOA documentation completion.  Noted importance of this with patient not being married and having a 19 year old son with developmental disabilities who lives in a group home.  Other children are deceased, she has a sister who lives locally.  Encourage patient to participate in future planning knowing that she has a serious cancer.  Patient acknowledged importance of this.                -  Code Status: Full Code    # Symptom management                -Pain, acute in the setting of recently diagnosed likely pancreatic cancer with metastatic disease to liver    OxyContin 40mg  q8hrs scheduled during the day. May need to make further adjustments based on patient's symptom response.                                -Continue p.o. oxycodone to 20 mg every 3  hours as needed.                               -Continue IV Dilaudid to 0.5 mg q2hrs for breakthrough pain after oral medication                               -Continue dexamethasone 4 mg daily to assist with inflammatory aspects of visceral pain from liver.                                IR evaluation for possible celiac plexus nerve block to assist with multimodal pain management.  *Asked for medications to be sent to [facility] pharmacy at time of discharge so medications can be brought bedside to her. She is worried about  this.*                 -Constipation                               -Continue senna 2 tabs today along with MiraLAX 17 g daily    -Itching   -Ordered lotion   -Continue benadryl to 50mg  q6hrs prn   -Reviewed LFTs today which are within normal limits   # Psycho-social/Spiritual Support:  - Support System: sister,friend -Patient willing to meet with chaplain to complete HCPOA paperwork.  Patient has 1 child who is a 63 year old female with developmental disability who lives in a group home.  Encouraged discussions outside of hospital with appropriate resources to have plan for his care moving forward as well.   # Discharge Planning:  TBD                -Already ordered for referral to outpatient PMT at [facility].   Discussed with: patient Thank you for allowing the palliative care team to participate in the care [name].  Mod MDM Rosalin Hawking, MD Palliative Care Provider PMT # [phone]  If patient remains symptomatic despite maximum doses, please call PMT at [phone] between 0700 and 1900. Outside of these hours, please call attending, as PMT does not have night coverage.  Greater than 50% of the time was spent counseling and coordinating care related to the above assessment and plan.    *Please note that this is a verbal dictation therefore any spelling or grammatical errors are due to the "[employer]" system interpretation.

## 2023-05-06 ENCOUNTER — Other Ambulatory Visit: Payer: Self-pay | Admitting: Nurse Practitioner

## 2023-05-06 ENCOUNTER — Other Ambulatory Visit: Payer: Self-pay

## 2023-05-06 ENCOUNTER — Encounter: Payer: Self-pay | Admitting: Hematology

## 2023-05-06 ENCOUNTER — Other Ambulatory Visit (HOSPITAL_COMMUNITY): Payer: Self-pay

## 2023-05-06 DIAGNOSIS — R52 Pain, unspecified: Secondary | ICD-10-CM | POA: Diagnosis not present

## 2023-05-06 DIAGNOSIS — C259 Malignant neoplasm of pancreas, unspecified: Secondary | ICD-10-CM | POA: Diagnosis not present

## 2023-05-06 DIAGNOSIS — G893 Neoplasm related pain (acute) (chronic): Secondary | ICD-10-CM

## 2023-05-06 DIAGNOSIS — Z515 Encounter for palliative care: Secondary | ICD-10-CM

## 2023-05-06 LAB — CBC
HCT: 35.8 % — ABNORMAL LOW (ref 36.0–46.0)
Hemoglobin: 12.3 g/dL (ref 12.0–15.0)
MCH: 31 pg (ref 26.0–34.0)
MCHC: 34.4 g/dL (ref 30.0–36.0)
MCV: 90.2 fL (ref 80.0–100.0)
Platelets: 150 10*3/uL (ref 150–400)
RBC: 3.97 MIL/uL (ref 3.87–5.11)
RDW: 14.2 % (ref 11.5–15.5)
WBC: 6.7 10*3/uL (ref 4.0–10.5)
nRBC: 0 % (ref 0.0–0.2)

## 2023-05-06 LAB — BASIC METABOLIC PANEL
Anion gap: 9 (ref 5–15)
BUN: 20 mg/dL (ref 8–23)
CO2: 28 mmol/L (ref 22–32)
Calcium: 8.5 mg/dL — ABNORMAL LOW (ref 8.9–10.3)
Chloride: 94 mmol/L — ABNORMAL LOW (ref 98–111)
Creatinine, Ser: 0.74 mg/dL (ref 0.44–1.00)
GFR, Estimated: >60 mL/min (ref >60)
Glucose, Bld: 112 mg/dL — ABNORMAL HIGH (ref 70–99)
Potassium: 3.3 mmol/L — ABNORMAL LOW (ref 3.5–5.1)
Sodium: 131 mmol/L — ABNORMAL LOW (ref 135–145)

## 2023-05-06 LAB — MAGNESIUM: Magnesium: 1.7 mg/dL (ref 1.7–2.4)

## 2023-05-06 LAB — GLUCOSE, CAPILLARY
Glucose-Capillary: 135 mg/dL — ABNORMAL HIGH (ref 70–99)
Glucose-Capillary: 166 mg/dL — ABNORMAL HIGH (ref 70–99)
Glucose-Capillary: 275 mg/dL — ABNORMAL HIGH (ref 70–99)
Glucose-Capillary: 368 mg/dL — ABNORMAL HIGH (ref 70–99)

## 2023-05-06 LAB — PROTIME-INR
INR: 1.5 — ABNORMAL HIGH (ref 0.8–1.2)
Prothrombin Time: 17.9 seconds — ABNORMAL HIGH (ref 11.4–15.2)

## 2023-05-06 MED ORDER — OXYCODONE HCL ER 40 MG PO T12A
40.0000 mg | EXTENDED_RELEASE_TABLET | Freq: Three times a day (TID) | ORAL | 0 refills | Status: DC
  Filled 2023-05-06: qty 45, 15d supply, fill #0

## 2023-05-06 MED ORDER — OXYCODONE HCL 20 MG PO TABS
20.0000 mg | ORAL_TABLET | Freq: Four times a day (QID) | ORAL | 0 refills | Status: DC | PRN
  Filled 2023-05-06: qty 20, 5d supply, fill #0

## 2023-05-06 MED ORDER — INSULIN ASPART 100 UNIT/ML IJ SOLN
5.0000 [IU] | Freq: Once | INTRAMUSCULAR | Status: AC
  Administered 2023-05-06: 5 [IU] via SUBCUTANEOUS

## 2023-05-06 MED ORDER — WARFARIN SODIUM 5 MG PO TABS: 5.0000 mg | ORAL_TABLET | Freq: Once | ORAL | Status: DC

## 2023-05-06 MED ORDER — ALUM & MAG HYDROXIDE-SIMETH 200-200-20 MG/5ML PO SUSP
30.0000 mL | ORAL | 0 refills | Status: DC | PRN
  Filled 2023-05-06: qty 355, 2d supply, fill #0

## 2023-05-06 MED ORDER — DEXAMETHASONE 4 MG PO TABS
4.0000 mg | ORAL_TABLET | Freq: Every day | ORAL | 0 refills | Status: DC
  Filled 2023-05-06: qty 30, 30d supply, fill #0

## 2023-05-06 MED ORDER — HEPARIN SOD (PORK) LOCK FLUSH 100 UNIT/ML IV SOLN
500.0000 [IU] | Freq: Once | INTRAVENOUS | Status: AC
  Administered 2023-05-06: 500 [IU] via INTRAVENOUS
  Filled 2023-05-06: qty 5

## 2023-05-06 MED ORDER — POLYETHYLENE GLYCOL 3350 17 GM/SCOOP PO POWD
17.0000 g | Freq: Every day | ORAL | 0 refills | Status: DC
  Filled 2023-05-06: qty 238, 14d supply, fill #0

## 2023-05-06 MED ORDER — XTAMPZA ER 36 MG PO C12A
36.0000 mg | EXTENDED_RELEASE_CAPSULE | Freq: Three times a day (TID) | ORAL | 0 refills | Status: DC
  Filled 2023-05-06: qty 45, 15d supply, fill #0

## 2023-05-06 MED ORDER — POTASSIUM CHLORIDE CRYS ER 20 MEQ PO TBCR
40.0000 meq | EXTENDED_RELEASE_TABLET | Freq: Once | ORAL | Status: AC
  Administered 2023-05-06: 40 meq via ORAL
  Filled 2023-05-06: qty 2

## 2023-05-06 MED ORDER — SENNA 8.6 MG PO TABS
2.0000 | ORAL_TABLET | Freq: Every day | ORAL | 0 refills | Status: DC
  Filled 2023-05-06: qty 30, 15d supply, fill #0

## 2023-05-06 NOTE — Discharge Summary (Signed)
Physician Discharge Summary  Rynlee Cosman ZOX:[id number] DOB: 1961/11/22 DOA: 04/26/2023  PCP: Chrys Racer, MD  Admit date: 04/26/2023 Discharge date: 05/06/2023  Admitted From: Home Disposition: Home  Recommendations for Outpatient Follow-up:  Follow up with PCP in 1 week with repeat CBC/CMP/INR Outpatient follow-up with oncology and palliative care Follow up in ED if symptoms worsen or new appear   Home Health: No Equipment/Devices: None  Discharge Condition: Guarded CODE STATUS: Full Diet recommendation: Heart healthy/carb modified  Brief/Interim Summary: 61 years old female with PMH significant for rheumatic heart disease s/p MV and TV repair in 04/2022, history of pericardial effusion / tamponade s/p pericardiocentesis in June 2023, PAF on Coumadin, CKD stage IIIa, COPD, type 2 diabetes, hypertension, tobacco use, recent admission for UTI and new diagnosis of pancreatic adenocarcinoma with possible liver and nodal metastases presented with worsening abdominal pain.  She was admitted for intractable abdominal pain.  Oncology/palliative care/IR were consulted.  She underwent liver biopsy and port placement by IR on 05/03/2023.  She underwent celiac plexus nerve block on 05/04/2023 by IR.  Subsequently, her pain is improving.  She is tolerating diet.  Coumadin has been resumed.  Liver biopsy pathology confirmed adenocarcinoma, consistent with metastatic pancreatic cancer.  Oncology is planning for initiation of chemotherapy as an outpatient next week.  Oncology has cleared the patient for discharge.  Discharge patient home today.  Outpatient follow-up with PCP/oncology/palliative care.  Discharge Diagnoses:   Intractable abdominal pain -In the setting of recently diagnosed metastatic pancreatic cancer -Palliative care following.  Pain is improving.  Continue current pain management regimen.  Outpatient follow-up with PCP/oncology/pain management. -Status post celiac plexus nerve  block on 05/04/2023 by IR   Recent diagnosis of pancreatic cancer with possible liver and nodal metastasis -Had EUS on 04/07/2023 but biopsy was inconclusive.  Oncology recommended liver biopsy - underwent liver biopsy and port placement by IR on 05/03/2023.   Liver biopsy pathology confirmed adenocarcinoma, consistent with metastatic pancreatic cancer.  Oncology is planning for initiation of chemotherapy as an outpatient next week.  Oncology has cleared the patient for discharge.  Discharge patient home today.  Outpatient follow-up with PCP/oncology/palliative care.   Paroxysmal A-fib -Currently rate controlled.  Continue metoprolol -Patient required reversal of Coumadin by [facility]FP and vitamin K for IR procedure -Coumadin has been resumed from 05/05/2023 onwards.  Continue Coumadin on discharge.  Outpatient follow-up of INR and Coumadin dosing as per the INR by PCP. -Continue Cardizem.  Outpatient follow-up with cardiology.  Diabetes melitis type II with hyperglycemia and hypoglycemia Hyperlipidemia -Carb modified diet.  Continue long-acting insulin.  Outpatient follow-up with PCP. -Continue statin   Hyponatremia -Mild.  Encourage oral intake.  Outpatient follow-up.   Hypomagnesemia -Improved  Hypokalemia -Replace prior to discharge.   Thrombocytopenia -Resolved   CKD stage IIIa -Creatinine at baseline.   Hypertension -Continue home regimen.  Outpatient follow-up.   Depression--continue current regimen.  Outpatient follow-up.    Discharge Instructions  Discharge Instructions     Amb Referral to Palliative Care   Complete by: As directed    Diet - low sodium heart healthy   Complete by: As directed    Diet Carb Modified   Complete by: As directed    Increase activity slowly   Complete by: As directed    No wound care   Complete by: As directed    Clinic Appointment Request   Complete by: May 13, 2023    Contact your oncology clinic or infusion center to schedule this  appointment.   Infusion Appointment Request   Complete by: May 13, 2023    Contact your oncology clinic or infusion center to schedule this appointment.   Lab Appointment Request   Complete by: May 13, 2023    Contact your oncology clinic or infusion center to schedule this appointment.   Clinic Appointment Request   Complete by: May 27, 2023    Contact your oncology clinic or infusion center to schedule this appointment.   Infusion Appointment Request   Complete by: May 27, 2023    Contact your oncology clinic or infusion center to schedule this appointment.   Lab Appointment Request   Complete by: May 27, 2023    Contact your oncology clinic or infusion center to schedule this appointment.   Clinic Appointment Request   Complete by: Jun 10, 2023    Contact your oncology clinic or infusion center to schedule this appointment.   Infusion Appointment Request   Complete by: Jun 10, 2023    Contact your oncology clinic or infusion center to schedule this appointment.   Lab Appointment Request   Complete by: Jun 10, 2023    Contact your oncology clinic or infusion center to schedule this appointment.   Clinic Appointment Request   Complete by: Jun 24, 2023    Contact your oncology clinic or infusion center to schedule this appointment.   Infusion Appointment Request   Complete by: Jun 24, 2023    Contact your oncology clinic or infusion center to schedule this appointment.   Lab Appointment Request   Complete by: Jun 24, 2023    Contact your oncology clinic or infusion center to schedule this appointment.   Clinic Appointment Request   Complete by: Jul 08, 2023    Contact your oncology clinic or infusion center to schedule this appointment.   Infusion Appointment Request   Complete by: Jul 08, 2023    Contact your oncology clinic or infusion center to schedule this appointment.   Lab Appointment Request   Complete by: Jul 08, 2023    Contact your oncology clinic or infusion  center to schedule this appointment.   Clinic Appointment Request   Complete by: Jul 22, 2023    Contact your oncology clinic or infusion center to schedule this appointment.   Infusion Appointment Request   Complete by: Jul 22, 2023    Contact your oncology clinic or infusion center to schedule this appointment.   Lab Appointment Request   Complete by: Jul 22, 2023    Contact your oncology clinic or infusion center to schedule this appointment.   Clinic Appointment Request   Complete by: Aug 05, 2023    Contact your oncology clinic or infusion center to schedule this appointment.   Infusion Appointment Request   Complete by: Aug 05, 2023    Contact your oncology clinic or infusion center to schedule this appointment.   Lab Appointment Request   Complete by: Aug 05, 2023    Contact your oncology clinic or infusion center to schedule this appointment.   Clinic Appointment Request   Complete by: Aug 19, 2023    Contact your oncology clinic or infusion center to schedule this appointment.   Infusion Appointment Request   Complete by: Aug 19, 2023    Contact your oncology clinic or infusion center to schedule this appointment.   Lab Appointment Request   Complete by: Aug 19, 2023    Contact your oncology clinic or infusion center to schedule this appointment.  Allergies as of 05/06/2023       Reactions   Norco [hydrocodone-acetaminophen] Itching, Other (See Comments)   Tolerates Oxycodone   Hydrocodone Itching   Tolerates oxycodone         Medication List     STOP taking these medications    famotidine 20 MG tablet Commonly known as: PEPCID   fluconazole 100 MG tablet Commonly known as: DIFLUCAN   FLUoxetine 20 MG capsule Commonly known as: PROZAC   omeprazole 10 MG capsule Commonly known as: PRILOSEC       TAKE these medications    alum & mag hydroxide-simeth 200-200-20 MG/5ML suspension Commonly known as: MAALOX/MYLANTA Take 30 mLs by mouth every 4  (four) hours as needed for indigestion.   ARIPiprazole 5 MG tablet Commonly known as: ABILIFY Take 2.5 mg by mouth daily.   atorvastatin 20 MG tablet Commonly known as: LIPITOR Take 20 mg by mouth daily.   Blood Glucose Monitoring Suppl Devi 1 each by Does not apply route in the morning, at noon, and at bedtime. May substitute to any manufacturer covered by patient's insurance.   BLOOD GLUCOSE TEST STRIPS Strp 1 each by In Vitro route in the morning, at noon, and at bedtime. May substitute to any manufacturer covered by patient's insurance.   buPROPion 300 MG 24 hr tablet Commonly known as: WELLBUTRIN XL Take 300 mg by mouth in the morning.   dexamethasone 4 MG tablet Commonly known as: DECADRON Take 1 tablet (4 mg total) by mouth daily. Start taking on: May 07, 2023   dexlansoprazole 60 MG capsule Commonly known as: DEXILANT Take 60 mg by mouth daily.   Farxiga 10 MG Tabs tablet Generic drug: dapagliflozin propanediol Take 10 mg by mouth daily.   furosemide 40 MG tablet Commonly known as: LASIX Take 1 tablet (40 mg total) by mouth daily.   gabapentin 300 MG capsule Commonly known as: NEURONTIN Take 300 mg by mouth 3 (three) times daily.   hydrALAZINE 25 MG tablet Commonly known as: APRESOLINE Take 1 tablet (25 mg total) by mouth daily as needed (BP>150).   insulin glargine 100 UNIT/ML Solostar Pen Commonly known as: LANTUS Inject 15 Units into the skin daily.   Insulin Pen Needle 31G X 5 MM Misc Lantus 15 units daily   Lancet Device Misc 1 each by Does not apply route in the morning, at noon, and at bedtime. May substitute to any manufacturer covered by patient's insurance.   Lancets Misc. Misc 1 each by Does not apply route in the morning, at noon, and at bedtime. May substitute to any manufacturer covered by patient's insurance.   lidocaine-prilocaine cream Commonly known as: EMLA Apply to affected area once as directed.   linagliptin 5 MG Tabs  tablet Commonly known as: TRADJENTA Take 5 mg by mouth daily.   metoprolol tartrate 50 MG tablet Commonly known as: LOPRESSOR Take 50 mg by mouth 2 (two) times daily.   morphine 30 MG 12 hr tablet Commonly known as: MS CONTIN Take 1 tablet (30 mg total) by mouth every 12 (twelve) hours.   naloxone 4 MG/0.1ML Liqd nasal spray kit Commonly known as: NARCAN PRN for suspected opiate OD   ondansetron 8 MG tablet Commonly known as: Zofran Take 1 tablet (8 mg total) by mouth every 8 (eight) hours as needed for nausea or vomiting.   Oxycodone HCl 20 MG Tabs Take 1 tablet (20 mg total) by mouth every 6 (six) hours as needed for breakthrough pain. What changed:  medication strength how much to take reasons to take this   oyster calcium 500 MG Tabs tablet Take 500 mg of elemental calcium by mouth daily.   polyethylene glycol powder 17 GM/SCOOP powder Commonly known as: GLYCOLAX/MIRALAX Take 17 g by mouth daily. Start taking on: May 07, 2023   Premarin 0.45 MG tablet Generic drug: estrogens (conjugated) Take 0.45 mg by mouth daily.   prochlorperazine 10 MG tablet Commonly known as: COMPAZINE Take 1 tablet (10 mg total) by mouth every 6 (six) hours as needed for nausea or vomiting.   QUEtiapine 25 MG tablet Commonly known as: SEROQUEL Take 25 mg by mouth as needed.   senna 8.6 MG Tabs tablet Commonly known as: SENOKOT Take 2 tablets (17.2 mg total) by mouth daily. Start taking on: May 07, 2023   Tiadylt ER 300 MG 24 hr capsule Generic drug: diltiazem Take 300 mg by mouth daily.   tobramycin-dexamethasone ophthalmic solution Commonly known as: TOBRADEX 1 drop 4 (four) times daily.   traZODone 100 MG tablet Commonly known as: DESYREL Take 200 mg by mouth at bedtime. Pt takes 2 tablets 200 mg at bedtime.   Trintellix 10 MG Tabs tablet Generic drug: vortioxetine HBr Take 10 mg by mouth daily.   Voltaren 1 % Gel Generic drug: diclofenac Sodium Apply 2 g topically 4  (four) times daily as needed (pain).   warfarin 5 MG tablet Commonly known as: COUMADIN Take as directed. If you are unsure how to take this medication, talk to your nurse or doctor. Original instructions: TAKE 1 TABLET BY MOUTH DAILY EXCEPT 1.5 TABLETS BY MOUTH ON SUNDAYS AS DIRECTED BY COUMADIN CLINIC What changed: See the new instructions.        Follow-up Information     Chrys Racer, MD. Schedule an appointment as soon as possible for a visit in 1 week(s).   Specialty: Internal Medicine Contact information: [address] [place] [address] [phone]                Allergies  Allergen Reactions   Norco [Hydrocodone-Acetaminophen] Itching and Other (See Comments)    Tolerates Oxycodone   Hydrocodone Itching    Tolerates oxycodone     Consultations: IR/palliative care/oncology   Procedures/Studies: CT CELIAC PLEXUS BLOCK NEUROLYTIC  Result Date: 05/04/2023 CLINICAL DATA:  Refractory abdominal pain secondary to metastatic pancreatic carcinoma. The patient presents for celiac block with planned neurolysis of the celiac plexus. EXAM: CT GUIDED NEUROLYTIC ABLATION OF THE CELIAC AXIS ANESTHESIA/SEDATION: Moderate (conscious) sedation was employed during this procedure. A total of Versed 2.0 mg and Fentanyl 100 mcg was administered intravenously. Moderate Sedation Time: 23 minutes. The patient's level of consciousness and vital signs were monitored continuously by radiology nursing throughout the procedure under my direct supervision. PROCEDURE: The procedure risks, benefits, and alternatives were explained to the patient. Questions regarding the procedure were encouraged and answered. The patient understands and consents to the procedure. A time-out was performed prior to initiating the procedure. CT was performed in a supine position through the upper to mid abdomen. The anterior abdominal wall was prepped with chlorhexidine in a sterile fashion, and a  sterile drape was applied covering the operative field. A sterile gown and sterile gloves were used for the procedure. Local anesthesia was provided with 1% Lidocaine. A 22 gauge Chiba needle was advanced under CT guidance to the level of the celiac plexus. After confirming needle tip position, approximately three ml of a 1:10 dilution of Omnipaque-300 contrast and saline was  injected. Spread of diluted contrast material was confirmed by CT. A mixture of 5 ml of 0.5% Sensorcaine and 80 mg Depo-Medrol was then made. This was injected through the needle. Alcohol neurolytic ablation of the celiac plexus was then performed with injection of 20 ml of absolute alcohol. COMPLICATIONS: None FINDINGS: Contrast injection showed excellent spread across the midline at the level of the celiac plexus. Alcohol ablation was successfully performed. IMPRESSION: CT guided neurolytic ablation of the celiac plexus as above. Alcohol ablation was performed with 20 ml of absolute alcohol. Therapeutic injection of Sensorcaine and Depo-Medrol was also performed just prior to alcohol ablation. Electronically Signed   By: Irish Lack M.D.   On: 05/04/2023 14:17   US BIOPSY (LIVER)  Result Date: 05/03/2023 INDICATION: 62 year old with pancreatic mass and liver lesions. Probable metastatic pancreatic cancer. Port-A-Cath was placed immediately prior to this procedure. EXAM: ULTRASOUND-GUIDED LIVER LESION BIOPSY MEDICATIONS: Moderate sedation ANESTHESIA/SEDATION: Moderate (conscious) sedation was employed during this procedure. A total of Versed 1 mg was administered intravenously by the radiology nurse. Additional Versed and fentanyl was given immediately prior to this procedure for the Port-A-Cath placement. Total intra-service moderate Sedation Time: 20 minutes. The patient's level of consciousness and vital signs were monitored continuously by radiology nursing throughout the procedure under my direct supervision. FLUOROSCOPY TIME:   None COMPLICATIONS: None immediate. PROCEDURE: Informed written consent was obtained from the patient after a thorough discussion of the procedural risks, benefits and alternatives. All questions were addressed. Maximal Sterile Barrier Technique was utilized including caps, mask, sterile gowns, sterile gloves, sterile drape, hand hygiene and skin antiseptic. A timeout was performed prior to the initiation of the procedure. Ultrasound demonstrated subtle hypoechoic liver lesions. Right hepatic lesion was targeted. Right side of the abdomen was prepped with chlorhexidine and sterile field was created. Skin was anesthetized using 1% lidocaine. Small incision was made. Using ultrasound guidance, 17 gauge coaxial needle was directed into the right hepatic lobe and a hypoechoic lesion. Three core biopsies were obtained with an 18 gauge core device. Specimens placed in formalin. 17 gauge needle was removed without complication. Bandage placed over the puncture site. FINDINGS: Subtle hypoechoic lesions in the liver. Core biopsy needle was confirmed within a small right hepatic lesion. No immediate bleeding or hematoma formation. IMPRESSION: Ultrasound-guided core biopsy of a right hepatic lesion. Electronically Signed   By: Richarda Overlie M.D.   On: 05/03/2023 21:38   IR IMAGING GUIDED PORT INSERTION  Result Date: 05/03/2023 INDICATION: 62 year old with pancreatic mass and liver lesions. Probable metastatic pancreatic cancer. Plan for placement of Port-A-Cath and liver lesion biopsy. EXAM: FLUOROSCOPIC AND ULTRASOUND GUIDED PLACEMENT OF A SUBCUTANEOUS PORT COMPARISON:  None Available. MEDICATIONS: Moderate sedation ANESTHESIA/SEDATION: Moderate (conscious) sedation was employed during this procedure. A total of Versed 1.75 mg and fentanyl 100 mcg was administered intravenously at the order of the provider performing the procedure. Total intra-service moderate sedation time: 29 minutes. Patient's level of consciousness and  vital signs were monitored continuously by radiology nurse throughout the procedure under the supervision of the provider performing the procedure. FLUOROSCOPY TIME:  Radiation Exposure Index (as provided by the fluoroscopic device): 1 mGy Kerma COMPLICATIONS: None immediate. PROCEDURE: The procedure, risks, benefits, and alternatives were explained to the patient. Questions regarding the procedure were encouraged and answered. The patient understands and consents to the procedure. Patient was placed supine on the interventional table. Ultrasound confirmed a patent right internal jugular vein. Ultrasound image was saved for documentation. The right chest and neck  were cleaned with a skin antiseptic and a sterile drape was placed. Maximal barrier sterile technique was utilized including caps, mask, sterile gowns, sterile gloves, sterile drape, hand hygiene and skin antiseptic. The right neck was anesthetized with 1% lidocaine. Small incision was made in the right neck with a blade. Micropuncture set was placed in the right internal jugular vein with ultrasound guidance. The micropuncture wire was used for measurement purposes. The right chest was anesthetized with 1% lidocaine with epinephrine. #15 blade was used to make an incision and a subcutaneous port pocket was formed. 8 french Power Port was assembled. Subcutaneous tunnel was formed with a stiff tunneling device. The port catheter was brought through the subcutaneous tunnel. The port was placed in the subcutaneous pocket. The micropuncture set was exchanged for a peel-away sheath. The catheter was placed through the peel-away sheath and the tip was positioned at the superior cavoatrial junction. Catheter placement was confirmed with fluoroscopy. The port was accessed and flushed with saline. The port pocket was closed using two layers of absorbable sutures and Dermabond. The vein skin site was closed using a single layer of absorbable suture and Dermabond.  Sterile dressings were applied. Patient tolerated the procedure well without an immediate complication. Ultrasound and fluoroscopic images were taken and saved for this procedure. IMPRESSION: Placement of a subcutaneous power-injectable port device. Catheter tip at the superior cavoatrial junction. Electronically Signed   By: Richarda Overlie M.D.   On: 05/03/2023 21:35   CT ABDOMEN PELVIS W CONTRAST  Result Date: 04/24/2023 CLINICAL DATA:  Abdominal pain.  Metastatic pancreatic cancer. EXAM: CT ABDOMEN AND PELVIS WITH CONTRAST TECHNIQUE: Multidetector CT imaging of the abdomen and pelvis was performed using the standard protocol following bolus administration of intravenous contrast. RADIATION DOSE REDUCTION: This exam was performed according to the departmental dose-optimization program which includes automated exposure control, adjustment of the mA and/or kV according to patient size and/or use of iterative reconstruction technique. CONTRAST:  OMNIPAQUE IOHEXOL 300 MG/ML  SOLN COMPARISON:  CT abdomen pelvis dated 04/03/2023. FINDINGS: Lower chest: Similar appearance of right lung base subpleural nodules measure up to 8 mm. Mitral and aortic valve repairs. No intra-abdominal free air or free fluid. Hepatobiliary: Multiple hypoenhancing hepatic metastatic disease. These lesions appear more conspicuous or have increased since the prior CT. No biliary dilatation. The gallbladder is unremarkable. Pancreas: Hypoenhancing mass involving the body and tail of the pancreas as seen previously in keeping with known malignancy. Spleen: Normal in size without focal abnormality. Adrenals/Urinary Tract: The adrenal glands are unremarkable. There is no hydronephrosis on either side. There is symmetric enhancement and excretion of contrast by both kidneys. The visualized ureters and the bladder appear unremarkable. Stomach/Bowel: Postsurgical changes of bowel with anastomotic suture in the pelvis. There is no bowel obstruction  or active inflammation. The appendix is not visualized with certainty. No inflammatory changes identified in the right lower quadrant. Vascular/Lymphatic: Mild aortoiliac atherosclerotic disease. There is tumor encasement of the celiac trunk. There is infiltration of the fat plane around the proximal SMA suggestive of encasement. The celiac artery, SMA, IMA are patent. The renal arteries are patent. There is in case mint of the proximal half of the splenic artery with mass effect and high-grade luminal narrowing. The splenic vein is not visualized and appears thrombosed. The SMV and main portal vein are patent. A 2.7 x 2.0 cm hypoenhancing soft tissue abutting the main portal vein (25/2) may represent an enlarged and metastatic adenopathy versus infiltrative tumor. No portal  venous gas. Reproductive: Hysterectomy.  No adnexal masses. Other: Midline vertical anterior pelvic wall incisional scar. Musculoskeletal: Degenerative changes of the spine. No acute osseous pathology. IMPRESSION: 1. No acute intra-abdominal or pelvic pathology. 2. Hypoenhancing mass involving the body and tail of the pancreas in keeping with known malignancy. 3. Multiple hypoenhancing hepatic metastatic disease. These lesions appear more conspicuous or have increased since the prior CT. 4. 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 6. Similar appearance of right lung base subpleural nodules measure up to 8 mm. 7.  Aortic Atherosclerosis (ICD10-I70.0). Electronically Signed   By: Elgie Collard M.D.   On: 04/24/2023 21:06      Subjective: Patient seen and examined at bedside.  Tolerating diet, pain is better controlled.  Feels okay to go home today.  No fever, vomiting reported.  Discharge Exam: Vitals:   05/05/23 2137 05/06/23 0355  BP: 125/87 (!) 142/99  Pulse: 80 64  Resp: 18 18  Temp: 97.6 F (36.4 C) 97.7 F (36.5 C)  SpO2: 100% 100%    General: Pt is alert,  awake, not in acute distress.  Looks chronically ill and deconditioned.  On room air. Cardiovascular: rate controlled, S1/S2 + Respiratory: bilateral decreased breath sounds at bases with scattered crackles Abdominal: Soft, mildly distended, mildly tender, bowel sounds + Extremities: Mild lower extremity edema present; no cyanosis    The results of significant diagnostics from this hospitalization (including imaging, microbiology, ancillary and laboratory) are listed below for reference.     Microbiology: No results found for this or any previous visit (from the past 240 hour(s)).   Labs: BNP (last 3 results) Recent Labs    08/10/22 0231 08/12/22 1352 08/12/22 1829  BNP 713.2* 555.6* 497.2*   Basic Metabolic Panel: Recent Labs  Lab 05/02/23 0751 05/05/23 0500 05/06/23 0635  NA 132* 132* 131*  K 3.6 3.6 3.3*  CL 96* 97* 94*  CO2 26 27 28   GLUCOSE 260* 58* 112*  BUN 22 24* 20  CREATININE 0.95 0.92 0.74  CALCIUM 8.7* 8.5* 8.5*  MG  --  1.6* 1.7   Liver Function Tests: Recent Labs  Lab 05/02/23 0751  AST 23  ALT 24  ALKPHOS 96  BILITOT 0.6  PROT 7.1  ALBUMIN 3.8   No results for input(s): "LIPASE", "AMYLASE" in the last 168 hours. No results for input(s): "AMMONIA" in the last 168 hours. CBC: Recent Labs  Lab 05/02/23 0751 05/03/23 0557 05/04/23 0552 05/05/23 0500 05/06/23 0635  WBC 5.5 5.8 5.2 8.3 6.7  HGB 14.4 13.3 12.2 12.1 12.3  HCT 43.4 39.1 36.2 35.9* 35.8*  MCV 91.0 89.7 91.6 90.2 90.2  PLT 168 177 147* 219 150   Cardiac Enzymes: No results for input(s): "CKTOTAL", "CKMB", "CKMBINDEX", "TROPONINI" in the last 168 hours. BNP: Invalid input(s): "POCBNP" CBG: Recent Labs  Lab 05/05/23 1636 05/05/23 2141 05/06/23 0053 05/06/23 0357 05/06/23 0737  GLUCAP 337* 442* 368* 166* 135*   D-Dimer No results for input(s): "DDIMER" in the last 72 hours. Hgb A1c No results for input(s): "HGBA1C" in the last 72 hours. Lipid Profile No results for  input(s): "CHOL", "HDL", "LDLCALC", "TRIG", "CHOLHDL", "LDLDIRECT" in the last 72 hours. Thyroid function studies No results for input(s): "TSH", "T4TOTAL", "T3FREE", "THYROIDAB" in the last 72 hours.  Invalid input(s): "FREET3" Anemia work up No results for input(s): "VITAMINB12", "FOLATE", "FERRITIN", "TIBC", "IRON", "RETICCTPCT" in the last 72 hours. Urinalysis    Component Value Date/Time  COLORURINE AMBER (A) 04/27/2023 0025   APPEARANCEUR HAZY (A) 04/27/2023 0025   LABSPEC 1.030 04/27/2023 0025   PHURINE 5.0 04/27/2023 0025   GLUCOSEU >=500 (A) 04/27/2023 0025   GLUCOSEU NEG mg/dL 16/09/9603 5409   HGBUR NEGATIVE 04/27/2023 0025   HGBUR negative 02/27/2007 1327   BILIRUBINUR NEGATIVE 04/27/2023 0025   KETONESUR 5 (A) 04/27/2023 0025   PROTEINUR 30 (A) 04/27/2023 0025   UROBILINOGEN 1.0 06/10/2010 1729   NITRITE NEGATIVE 04/27/2023 0025   LEUKOCYTESUR TRACE (A) 04/27/2023 0025   Sepsis Labs Recent Labs  Lab 05/03/23 0557 05/04/23 0552 05/05/23 0500 05/06/23 0635  WBC 5.8 5.2 8.3 6.7   Microbiology No results found for this or any previous visit (from the past 240 hour(s)).   Time coordinating discharge: 35 minutes  SIGNED:   Glade Lloyd, MD  [facility] 05/06/2023, 9:52 AM

## 2023-05-06 NOTE — Progress Notes (Signed)
Patient had blood glucose of 442 at 2141, provider contacted and 6 units of Novolog administered. At  0053 blood glucose was 368, provider contacted and 5 units of Novolog administered. At 0357 blood glucose was 166.

## 2023-05-06 NOTE — Progress Notes (Addendum)
ANTICOAGULATION CONSULT NOTE - Follow Up Consult  Pharmacy Consult for heparin--> warfarin Indication: hx atrial fibrillation   Allergies  Allergen Reactions   Norco [Hydrocodone-Acetaminophen] Itching and Other (See Comments)    Tolerates Oxycodone   Hydrocodone Itching    Tolerates oxycodone     Patient Measurements: Height: 5\' 11"  (180.3 cm) Weight: 80.7 kg (178 lb) IBW/kg (Calculated) : 70.8 Heparin Dosing Weight: 80 kg  Vital Signs: Temp: 97.7 F (36.5 C) (05/31 0355) Temp Source: Oral (05/31 0355) BP: 142/99 (05/31 0355) Pulse Rate: 64 (05/31 0355)  Labs: Recent Labs    05/04/23 0552 05/05/23 0500 05/06/23 0635  HGB 12.2 12.1 12.3  HCT 36.2 35.9* 35.8*  PLT 147* 219 150  LABPROT 18.8* 18.0* 17.9*  INR 1.6* 1.5* 1.5*  HEPARINUNFRC 0.68 0.41  --   CREATININE  --  0.92 0.74     Estimated Creatinine Clearance: 80.4 mL/min (by C-G formula based on SCr of 0.74 mg/dL).   Medications:  - PTA warfarin regimen: 5mg  daily except 2.5mg  on Sundays  Assessment: Patient is a 62 y.o F with hx afib on warfarin PTA, who was recently diagnosed with pancreatic cancer and was supposed to have a liver biopsy and port placement on 05/03/23. She presented to the ED on 04/26/23 with c/o abdominal pain. Warfarin held on admission, INR was reversed with vit K and FFP and heparin started on 5/26 in anticipation for biopsy. Patient underwent port placement and liver lesion biopsy on 05/03/23 with heparin drip held prior to procedure and resumed back 4 hrs s/p procedure. Heparin drip d/ced on 5/30 and warfarin resumed back for patient.   - liver biopsy showed adenocarcinoma   Today, 05/06/2023: - INR is sub-therapeutic at 1.5 - hgb stable, plts down 150K - no bleeding documented  - no significant drug-drug intxns noted  Significant events: - 5/24 INR 2.0, give FFP x2  units - 5/25: INR 2.2, give 5mg  IV Vit K - 5/26: INR 1, Start IV heparin - 5/28: liver lesion biopsy and port  placement  - 5/29: celiac plexus block - 5/30: ok to resume warfarin per IR    Goal of Therapy:  INR 2-3 Monitor platelets by anticoagulation protocol: Yes   Plan:  - Warfarin 5 mg PO x1 - daily INR  - monitor for s/sx bleeding  Leontyne Manville P 05/06/2023,8:01 AM

## 2023-05-06 NOTE — Progress Notes (Signed)
PMT no charge note Chart reviewed Note plans for discharge Reached out to PMT colleague at [facility] - [name], NP. Patient is established with palliative care at [facility] and is to be seen in follow up for pain and non-pain symptom management on 05-11-23.  No charge Nicole Hawking MD  palliative.

## 2023-05-06 NOTE — Progress Notes (Signed)
NGS testing requested via [employer] on line portal.  Order [facility].

## 2023-05-06 NOTE — Inpatient Diabetes Management (Signed)
Inpatient Diabetes Program Recommendations  AACE/ADA: New Consensus Statement on Inpatient Glycemic Control (2015)  Target Ranges:  Prepandial:   less than 140 mg/dL      Peak postprandial:   less than 180 mg/dL (1-2 hours)      Critically ill patients:  140 - 180 mg/dL    Latest Reference Range & Units 05/05/23 07:39 05/05/23 11:34 05/05/23 16:36 05/05/23 21:41  Glucose-Capillary 70 - 99 mg/dL 83 725 (H)  2 units Novolog  10 units Semglee 337 (H)  7 units Novolog  442 (H)  6 units Novolog   (H): Data is abnormally high  Latest Reference Range & Units 05/06/23 07:37  Glucose-Capillary 70 - 99 mg/dL 366 (H)  1 unit Novolog   (H): Data is abnormally high    Home DM Meds: Farxiga 10 mg daily          Tradjenta 5 mg daily       Lantus 15 units daily  Current Orders: Semglee 10 units daily      Novolog Sensitive Correction Scale/ SSI (0-9 units) TID AC       MD- Note pt getting Decadron 4 mg daily.  Late afternoon/ evening CBGs are significantly elevated  Please consider Starting Novolog Meal Coverage for Lunch and Dinner Only:  Novolog 4 units BID (Lunch 12pm and Dinner 5pm) HOLD if pt NPO HOLD if pt eats <50% meals    --Will follow patient during hospitalization--  Ambrose Finland RN, MSN, CDCES Diabetes Coordinator Inpatient Glycemic Control Team Team Pager: [phone] (8a-5p)

## 2023-05-10 ENCOUNTER — Emergency Department (HOSPITAL_COMMUNITY): Payer: 59

## 2023-05-10 ENCOUNTER — Inpatient Hospital Stay (HOSPITAL_COMMUNITY)
Admission: EM | Admit: 2023-05-10 | Discharge: 2023-05-13 | DRG: 690 | Disposition: A | Discharge status: Home Health | Payer: 59 | Attending: Internal Medicine | Admitting: Internal Medicine

## 2023-05-10 ENCOUNTER — Encounter (HOSPITAL_COMMUNITY): Payer: Self-pay

## 2023-05-10 ENCOUNTER — Other Ambulatory Visit: Payer: Self-pay

## 2023-05-10 DIAGNOSIS — E861 Hypovolemia: Secondary | ICD-10-CM | POA: Diagnosis present

## 2023-05-10 DIAGNOSIS — E871 Hypo-osmolality and hyponatremia: Secondary | ICD-10-CM | POA: Diagnosis present

## 2023-05-10 DIAGNOSIS — F419 Anxiety disorder, unspecified: Secondary | ICD-10-CM | POA: Diagnosis present

## 2023-05-10 DIAGNOSIS — K219 Gastro-esophageal reflux disease without esophagitis: Secondary | ICD-10-CM | POA: Diagnosis present

## 2023-05-10 DIAGNOSIS — Z813 Family history of other psychoactive substance abuse and dependence: Secondary | ICD-10-CM

## 2023-05-10 DIAGNOSIS — R64 Cachexia: Secondary | ICD-10-CM | POA: Diagnosis present

## 2023-05-10 DIAGNOSIS — I480 Paroxysmal atrial fibrillation: Secondary | ICD-10-CM | POA: Diagnosis present

## 2023-05-10 DIAGNOSIS — G893 Neoplasm related pain (acute) (chronic): Secondary | ICD-10-CM | POA: Diagnosis not present

## 2023-05-10 DIAGNOSIS — C779 Secondary and unspecified malignant neoplasm of lymph node, unspecified: Secondary | ICD-10-CM | POA: Diagnosis present

## 2023-05-10 DIAGNOSIS — E1122 Type 2 diabetes mellitus with diabetic chronic kidney disease: Secondary | ICD-10-CM | POA: Diagnosis present

## 2023-05-10 DIAGNOSIS — Z7189 Other specified counseling: Secondary | ICD-10-CM | POA: Diagnosis not present

## 2023-05-10 DIAGNOSIS — N390 Urinary tract infection, site not specified: Secondary | ICD-10-CM | POA: Insufficient documentation

## 2023-05-10 DIAGNOSIS — E1142 Type 2 diabetes mellitus with diabetic polyneuropathy: Secondary | ICD-10-CM | POA: Diagnosis present

## 2023-05-10 DIAGNOSIS — G894 Chronic pain syndrome: Secondary | ICD-10-CM | POA: Diagnosis present

## 2023-05-10 DIAGNOSIS — Z7984 Long term (current) use of oral hypoglycemic drugs: Secondary | ICD-10-CM

## 2023-05-10 DIAGNOSIS — Z79899 Other long term (current) drug therapy: Secondary | ICD-10-CM | POA: Diagnosis not present

## 2023-05-10 DIAGNOSIS — F17210 Nicotine dependence, cigarettes, uncomplicated: Secondary | ICD-10-CM | POA: Diagnosis present

## 2023-05-10 DIAGNOSIS — F32A Depression, unspecified: Secondary | ICD-10-CM | POA: Diagnosis present

## 2023-05-10 DIAGNOSIS — N1831 Chronic kidney disease, stage 3a: Secondary | ICD-10-CM | POA: Diagnosis present

## 2023-05-10 DIAGNOSIS — Z818 Family history of other mental and behavioral disorders: Secondary | ICD-10-CM

## 2023-05-10 DIAGNOSIS — Z608 Other problems related to social environment: Secondary | ICD-10-CM | POA: Diagnosis present

## 2023-05-10 DIAGNOSIS — Z8049 Family history of malignant neoplasm of other genital organs: Secondary | ICD-10-CM

## 2023-05-10 DIAGNOSIS — Z833 Family history of diabetes mellitus: Secondary | ICD-10-CM

## 2023-05-10 DIAGNOSIS — Z885 Allergy status to narcotic agent status: Secondary | ICD-10-CM

## 2023-05-10 DIAGNOSIS — I152 Hypertension secondary to endocrine disorders: Secondary | ICD-10-CM | POA: Diagnosis present

## 2023-05-10 DIAGNOSIS — Z66 Do not resuscitate: Secondary | ICD-10-CM | POA: Diagnosis present

## 2023-05-10 DIAGNOSIS — E1169 Type 2 diabetes mellitus with other specified complication: Secondary | ICD-10-CM | POA: Diagnosis present

## 2023-05-10 DIAGNOSIS — Z794 Long term (current) use of insulin: Secondary | ICD-10-CM

## 2023-05-10 DIAGNOSIS — E1159 Type 2 diabetes mellitus with other circulatory complications: Secondary | ICD-10-CM | POA: Diagnosis present

## 2023-05-10 DIAGNOSIS — E860 Dehydration: Secondary | ICD-10-CM | POA: Diagnosis present

## 2023-05-10 DIAGNOSIS — E440 Moderate protein-calorie malnutrition: Secondary | ICD-10-CM | POA: Diagnosis present

## 2023-05-10 DIAGNOSIS — R627 Adult failure to thrive: Secondary | ICD-10-CM | POA: Diagnosis present

## 2023-05-10 DIAGNOSIS — K5900 Constipation, unspecified: Secondary | ICD-10-CM | POA: Diagnosis present

## 2023-05-10 DIAGNOSIS — Z811 Family history of alcohol abuse and dependence: Secondary | ICD-10-CM

## 2023-05-10 DIAGNOSIS — J449 Chronic obstructive pulmonary disease, unspecified: Secondary | ICD-10-CM | POA: Diagnosis present

## 2023-05-10 DIAGNOSIS — Z515 Encounter for palliative care: Secondary | ICD-10-CM

## 2023-05-10 DIAGNOSIS — N3000 Acute cystitis without hematuria: Principal | ICD-10-CM | POA: Diagnosis present

## 2023-05-10 DIAGNOSIS — I5032 Chronic diastolic (congestive) heart failure: Secondary | ICD-10-CM | POA: Diagnosis present

## 2023-05-10 DIAGNOSIS — Z7901 Long term (current) use of anticoagulants: Secondary | ICD-10-CM

## 2023-05-10 DIAGNOSIS — R4589 Other symptoms and signs involving emotional state: Secondary | ICD-10-CM | POA: Diagnosis not present

## 2023-05-10 DIAGNOSIS — Z6821 Body mass index (BMI) 21.0-21.9, adult: Secondary | ICD-10-CM

## 2023-05-10 DIAGNOSIS — F411 Generalized anxiety disorder: Secondary | ICD-10-CM | POA: Diagnosis present

## 2023-05-10 DIAGNOSIS — E782 Mixed hyperlipidemia: Secondary | ICD-10-CM | POA: Diagnosis present

## 2023-05-10 DIAGNOSIS — C259 Malignant neoplasm of pancreas, unspecified: Secondary | ICD-10-CM | POA: Diagnosis present

## 2023-05-10 DIAGNOSIS — N12 Tubulo-interstitial nephritis, not specified as acute or chronic: Secondary | ICD-10-CM | POA: Diagnosis present

## 2023-05-10 DIAGNOSIS — E1165 Type 2 diabetes mellitus with hyperglycemia: Secondary | ICD-10-CM | POA: Diagnosis present

## 2023-05-10 DIAGNOSIS — C787 Secondary malignant neoplasm of liver and intrahepatic bile duct: Secondary | ICD-10-CM | POA: Diagnosis present

## 2023-05-10 DIAGNOSIS — Z952 Presence of prosthetic heart valve: Secondary | ICD-10-CM

## 2023-05-10 DIAGNOSIS — B9620 Unspecified Escherichia coli [E. coli] as the cause of diseases classified elsewhere: Secondary | ICD-10-CM | POA: Diagnosis present

## 2023-05-10 DIAGNOSIS — Z8249 Family history of ischemic heart disease and other diseases of the circulatory system: Secondary | ICD-10-CM

## 2023-05-10 DIAGNOSIS — I129 Hypertensive chronic kidney disease with stage 1 through stage 4 chronic kidney disease, or unspecified chronic kidney disease: Secondary | ICD-10-CM | POA: Diagnosis present

## 2023-05-10 DIAGNOSIS — R54 Age-related physical debility: Secondary | ICD-10-CM | POA: Diagnosis present

## 2023-05-10 DIAGNOSIS — D649 Anemia, unspecified: Secondary | ICD-10-CM | POA: Diagnosis present

## 2023-05-10 DIAGNOSIS — M797 Fibromyalgia: Secondary | ICD-10-CM | POA: Diagnosis present

## 2023-05-10 DIAGNOSIS — Z9889 Other specified postprocedural states: Secondary | ICD-10-CM

## 2023-05-10 DIAGNOSIS — Z90710 Acquired absence of both cervix and uterus: Secondary | ICD-10-CM

## 2023-05-10 LAB — URINALYSIS, ROUTINE W REFLEX MICROSCOPIC
Bilirubin Urine: NEGATIVE
Glucose, UA: >=500 mg/dL — AB
Hgb urine dipstick: NEGATIVE
Ketones, ur: NEGATIVE mg/dL
Nitrite: NEGATIVE
Protein, ur: 30 mg/dL — AB
Specific Gravity, Urine: 1.035 — ABNORMAL HIGH (ref 1.005–1.030)
WBC, UA: >50 WBC/hpf (ref 0–5)
pH: 5 (ref 5.0–8.0)

## 2023-05-10 LAB — COMPREHENSIVE METABOLIC PANEL
ALT: 32 U/L (ref 0–44)
AST: 28 U/L (ref 15–41)
Albumin: 3.8 g/dL (ref 3.5–5.0)
Alkaline Phosphatase: 126 U/L (ref 38–126)
Anion gap: 11 (ref 5–15)
BUN: 25 mg/dL — ABNORMAL HIGH (ref 8–23)
CO2: 22 mmol/L (ref 22–32)
Calcium: 8.5 mg/dL — ABNORMAL LOW (ref 8.9–10.3)
Chloride: 98 mmol/L (ref 98–111)
Creatinine, Ser: 0.89 mg/dL (ref 0.44–1.00)
GFR, Estimated: >60 mL/min (ref >60)
Glucose, Bld: 288 mg/dL — ABNORMAL HIGH (ref 70–99)
Potassium: 4.7 mmol/L (ref 3.5–5.1)
Sodium: 131 mmol/L — ABNORMAL LOW (ref 135–145)
Total Bilirubin: 0.9 mg/dL (ref 0.3–1.2)
Total Protein: 6.8 g/dL (ref 6.5–8.1)

## 2023-05-10 LAB — PROTIME-INR
INR: 1 (ref 0.8–1.2)
Prothrombin Time: 13.5 seconds (ref 11.4–15.2)

## 2023-05-10 LAB — I-STAT CHEM 8, ED
BUN: 27 mg/dL — ABNORMAL HIGH (ref 8–23)
Calcium, Ion: 1.1 mmol/L — ABNORMAL LOW (ref 1.15–1.40)
Chloride: 97 mmol/L — ABNORMAL LOW (ref 98–111)
Creatinine, Ser: 0.7 mg/dL (ref 0.44–1.00)
Glucose, Bld: 277 mg/dL — ABNORMAL HIGH (ref 70–99)
HCT: 39 % (ref 36.0–46.0)
Hemoglobin: 13.3 g/dL (ref 12.0–15.0)
Potassium: 4.4 mmol/L (ref 3.5–5.1)
Sodium: 133 mmol/L — ABNORMAL LOW (ref 135–145)
TCO2: 27 mmol/L (ref 22–32)

## 2023-05-10 LAB — CBC WITH DIFFERENTIAL/PLATELET
Abs Immature Granulocytes: 0.03 10*3/uL (ref 0.00–0.07)
Basophils Absolute: 0 10*3/uL (ref 0.0–0.1)
Basophils Relative: 0 %
Eosinophils Absolute: 0.1 10*3/uL (ref 0.0–0.5)
Eosinophils Relative: 1 %
HCT: 38.3 % (ref 36.0–46.0)
Hemoglobin: 13 g/dL (ref 12.0–15.0)
Immature Granulocytes: 0 %
Lymphocytes Relative: 25 %
Lymphs Abs: 1.8 10*3/uL (ref 0.7–4.0)
MCH: 31.1 pg (ref 26.0–34.0)
MCHC: 33.9 g/dL (ref 30.0–36.0)
MCV: 91.6 fL (ref 80.0–100.0)
Monocytes Absolute: 0.6 10*3/uL (ref 0.1–1.0)
Monocytes Relative: 8 %
Neutro Abs: 4.7 10*3/uL (ref 1.7–7.7)
Neutrophils Relative %: 66 %
Platelets: 169 10*3/uL (ref 150–400)
RBC: 4.18 MIL/uL (ref 3.87–5.11)
RDW: 13.7 % (ref 11.5–15.5)
WBC: 7.2 10*3/uL (ref 4.0–10.5)
nRBC: 0 % (ref 0.0–0.2)

## 2023-05-10 LAB — SURGICAL PATHOLOGY

## 2023-05-10 LAB — LIPASE, BLOOD: Lipase: 52 U/L — ABNORMAL HIGH (ref 11–51)

## 2023-05-10 MED ORDER — INSULIN ASPART 100 UNIT/ML IJ SOLN
0.0000 [IU] | Freq: Every day | INTRAMUSCULAR | Status: DC
  Administered 2023-05-10: 2 [IU] via SUBCUTANEOUS
  Filled 2023-05-10: qty 0.05

## 2023-05-10 MED ORDER — ONDANSETRON HCL 4 MG/2ML IJ SOLN
4.0000 mg | Freq: Once | INTRAMUSCULAR | Status: AC
  Administered 2023-05-10: 4 mg via INTRAVENOUS
  Filled 2023-05-10: qty 2

## 2023-05-10 MED ORDER — ATORVASTATIN CALCIUM 20 MG PO TABS
20.0000 mg | ORAL_TABLET | Freq: Every day | ORAL | Status: DC
  Administered 2023-05-11 – 2023-05-13 (×3): 20 mg via ORAL
  Filled 2023-05-10 (×3): qty 1

## 2023-05-10 MED ORDER — HYDROMORPHONE HCL 1 MG/ML IJ SOLN
1.0000 mg | Freq: Once | INTRAMUSCULAR | Status: AC
  Administered 2023-05-10: 1 mg via INTRAVENOUS
  Filled 2023-05-10: qty 1

## 2023-05-10 MED ORDER — ACETAMINOPHEN 325 MG PO TABS: 650.0000 mg | ORAL_TABLET | Freq: Four times a day (QID) | ORAL | Status: DC | PRN

## 2023-05-10 MED ORDER — SODIUM CHLORIDE 0.9 % IV SOLN: INTRAVENOUS | Status: AC

## 2023-05-10 MED ORDER — POLYETHYLENE GLYCOL 3350 17 G PO PACK: 17.0000 g | PACK | Freq: Every day | ORAL | Status: DC | PRN

## 2023-05-10 MED ORDER — HYDROMORPHONE HCL 1 MG/ML IJ SOLN
0.5000 mg | INTRAMUSCULAR | Status: DC | PRN
  Administered 2023-05-11 (×3): 0.5 mg via INTRAVENOUS
  Filled 2023-05-10 (×3): qty 0.5

## 2023-05-10 MED ORDER — PROCHLORPERAZINE EDISYLATE 10 MG/2ML IJ SOLN: 5.0000 mg | Freq: Four times a day (QID) | INTRAMUSCULAR | Status: DC | PRN

## 2023-05-10 MED ORDER — OXYCODONE HCL 5 MG PO TABS
5.0000 mg | ORAL_TABLET | Freq: Four times a day (QID) | ORAL | Status: DC | PRN
  Administered 2023-05-10 – 2023-05-11 (×3): 5 mg via ORAL
  Filled 2023-05-10 (×3): qty 1

## 2023-05-10 MED ORDER — IOHEXOL 300 MG/ML  SOLN
100.0000 mL | Freq: Once | INTRAMUSCULAR | Status: AC | PRN
  Administered 2023-05-10: 100 mL via INTRAVENOUS

## 2023-05-10 MED ORDER — METOPROLOL TARTRATE 25 MG PO TABS
12.5000 mg | ORAL_TABLET | Freq: Two times a day (BID) | ORAL | Status: DC
  Administered 2023-05-10 – 2023-05-13 (×5): 12.5 mg via ORAL
  Filled 2023-05-10 (×6): qty 1

## 2023-05-10 MED ORDER — SODIUM CHLORIDE 0.9 % IV SOLN
1.0000 g | Freq: Once | INTRAVENOUS | Status: DC
  Filled 2023-05-10: qty 10

## 2023-05-10 MED ORDER — INSULIN ASPART 100 UNIT/ML IJ SOLN
0.0000 [IU] | Freq: Three times a day (TID) | INTRAMUSCULAR | Status: DC
  Administered 2023-05-11: 2 [IU] via SUBCUTANEOUS
  Administered 2023-05-11: 3 [IU] via SUBCUTANEOUS
  Administered 2023-05-12 (×3): 2 [IU] via SUBCUTANEOUS
  Administered 2023-05-13: 5 [IU] via SUBCUTANEOUS
  Administered 2023-05-13: 3 [IU] via SUBCUTANEOUS
  Filled 2023-05-10: qty 0.09

## 2023-05-10 MED ORDER — SODIUM CHLORIDE 0.9 % IV SOLN
2.0000 g | INTRAVENOUS | Status: DC
  Administered 2023-05-10 – 2023-05-12 (×3): 2 g via INTRAVENOUS
  Filled 2023-05-10 (×3): qty 20

## 2023-05-10 MED ORDER — GABAPENTIN 300 MG PO CAPS
300.0000 mg | ORAL_CAPSULE | Freq: Three times a day (TID) | ORAL | Status: DC
  Administered 2023-05-10 – 2023-05-13 (×8): 300 mg via ORAL
  Filled 2023-05-10 (×8): qty 1

## 2023-05-10 MED ORDER — SODIUM CHLORIDE 0.9 % IV BOLUS
1000.0000 mL | Freq: Once | INTRAVENOUS | Status: AC
  Administered 2023-05-10: 1000 mL via INTRAVENOUS

## 2023-05-10 MED ORDER — PANTOPRAZOLE SODIUM 40 MG PO TBEC
40.0000 mg | DELAYED_RELEASE_TABLET | Freq: Every day | ORAL | Status: DC
  Administered 2023-05-11 – 2023-05-13 (×3): 40 mg via ORAL
  Filled 2023-05-10 (×3): qty 1

## 2023-05-10 MED ORDER — HYDROMORPHONE HCL 1 MG/ML IJ SOLN
0.5000 mg | Freq: Once | INTRAMUSCULAR | Status: AC
  Administered 2023-05-10: 0.5 mg via INTRAVENOUS
  Filled 2023-05-10: qty 1

## 2023-05-10 MED ORDER — BUPROPION HCL ER (XL) 300 MG PO TB24
300.0000 mg | ORAL_TABLET | Freq: Every day | ORAL | Status: DC
  Administered 2023-05-11 – 2023-05-13 (×3): 300 mg via ORAL
  Filled 2023-05-10 (×3): qty 1

## 2023-05-10 MED ORDER — HYDROMORPHONE HCL 1 MG/ML IJ SOLN: 0.5000 mg | Freq: Once | INTRAMUSCULAR | Status: DC

## 2023-05-10 NOTE — ED Provider Notes (Signed)
[place] EMERGENCY DEPARTMENT AT [facility] Provider Note  CSN: [id number] Arrival date & time: 05/10/23 1624  Chief Complaint(s) Abdominal Pain  HPI [name] is a 62 y.o. female history of CHF, hepatitis C, diabetes, valve repair on Coumadin, relatively recent diagnosis of pancreatic cancer presenting to the emergency department with abdominal pain.  She reports abdominal pain for around 1 week.  She is unsure if this feels similar to previous abdominal pain she has had with her pancreatic cancer.  She reports nausea but no vomiting.  She also reports diminished appetite.  She reports generalized weakness and fatigue.  No chest pain or shortness of breath.  No fevers or chills.  She reports she does have dysuria.  She reports that she has been drinking some ensures is much as she is able to tolerate.   Past Medical History Past Medical History:  Diagnosis Date   Acute pericardial effusion 05/17/2022   AKI (acute kidney injury) (HCC) 05/17/2022   Anxiety    Arthritis    Cervicalgia    CHF (congestive heart failure) (HCC)    Chondromalacia of patella    Chronic neck pain    Chronic pain syndrome    Depression    secondary to loss of her son at age 62   Diabetic neuropathy (HCC)    DMII (diabetes mellitus, type 2) (HCC)    Dysrhythmia    Enthesopathy of hip region    Fibromyalgia    GERD (gastroesophageal reflux disease)    Hep C w/o coma, chronic (HCC)    Hepatitis C    diagnosed 2005   Hypertension    Insomnia    Low back pain    Lumbago    Mitral regurgitation    Obesity    Pain in joint, upper arm    Primary localized osteoarthrosis, lower leg    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   Substance abuse (HCC)    sober since 2001   Tobacco abuse    Tricuspid regurgitation    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   Patient Active Problem List   Diagnosis Date Noted   Complicated UTI (urinary tract infection) 05/10/2023   Goals  of care, counseling/discussion 05/02/2023   Itching 05/01/2023   Counseling and coordination of care 05/01/2023   Medication management 04/30/2023   Pain of upper abdomen 04/28/2023   High risk medication use 04/28/2023   Need for emotional support 04/28/2023   Constipation 04/28/2023   Pancreatic cancer metastasized to liver (HCC) 04/28/2023   Palliative care encounter 04/28/2023   Essential hypertension 04/27/2023   Thrombocytopenia (HCC) 04/27/2023   Intractable pain 04/26/2023   Pancreatic cancer (HCC) 04/12/2023   Pancreatic mass 04/04/2023   Mass of pancreas 04/03/2023   Chronic kidney disease, stage 3a (HCC) 04/03/2023   Pulmonary nodules 04/03/2023   Mixed hyperlipidemia 02/28/2023   Rheumatic disease of heart valve 11/19/2022   COPD (chronic obstructive pulmonary disease) (HCC) 08/05/2022   Anxiety and depression 08/05/2022   QT prolongation 08/05/2022   Benign neoplasm of ascending colon    AVM (arteriovenous malformation) of colon without hemorrhage    Benign neoplasm of cecum    Benign neoplasm of transverse colon    Normocytic anemia 07/23/2022   Pain due to onychomycosis of toenails of both feet 06/23/2022   Encounter for therapeutic drug monitoring 06/11/2022   Hypomagnesemia 05/24/2022   Long term current use of anticoagulant therapy 05/17/2022   S/P MVR/TVR  04/19/22 05/17/2022  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 05/17/2022   PAF (paroxysmal atrial fibrillation) (HCC)    Pain of left hand 01/01/2021   Nasal vestibulitis 04/25/2020   FH: CHF (congestive heart failure) 04/05/2020   Acquired hallux interphalangeus 02/26/2020   Hallux rigidus 02/26/2020   Retained orthopedic hardware 02/26/2020   Mechanical complication associated with orthopedic device (HCC) 08/31/2019   Hypercholesterolemia 08/17/2019   Lateral epicondylitis of right elbow 03/31/2018   Arthritis of knee 03/09/2018   Arthritis of foot, degenerative 02/28/2018   Hallux valgus  (acquired), left foot 02/28/2018   Long term current use of oral hypoglycemic drug 01/16/2018   Pharyngeal dysphagia 12/21/2017   Left thyroid nodule 12/21/2017   History of colon polyps 09/21/2017   History of atrial fibrillation 08/23/2017   GERD (gastroesophageal reflux disease) 08/23/2017   Presbyopia of both eyes 07/15/2017   Nuclear sclerotic cataract of both eyes 07/15/2017   Dry eye syndrome of both lacrimal glands 07/15/2017   Acquired trigger finger of left ring finger 05/04/2017   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 01/17/2017   Paroxysmal atrial fibrillation (HCC) 12/30/2016   Osteoarthritis 12/30/2016   NSAID long-term use 12/30/2016   DDD (degenerative disc disease), lumbar 11/26/2015   Facet syndrome, lumbar 11/26/2015   Greater trochanteric bursitis 11/26/2015   Diabetic neuropathy (HCC) 11/26/2015   Insomnia 11/11/2015   PTSD (post-traumatic stress disorder) 11/11/2015   GAD (generalized anxiety disorder) 11/11/2015   Severe episode of recurrent major depressive disorder, without psychotic features (HCC) 11/11/2015   Tobacco use 06/27/2012   Sacroiliac joint dysfunction 05/18/2012   Right low back pain 04/19/2012   Cervical neck pain with evidence of disc disease 02/11/2012   Trochanteric bursitis of right hip 02/11/2012   Hematochezia 04/29/2011   Plantar fasciitis 03/11/2011   Acute cystitis 06/19/2010   INSOMNIA 01/18/2008   Chronic pain syndrome 12/15/2007   DIABETIC PERIPHERAL NEUROPATHY 05/08/2007   HEPATITIS C 02/27/2007   Type 2 diabetes mellitus with hyperlipidemia (HCC) 02/27/2007   Hypertension associated with diabetes (HCC) 02/27/2007   Home Medication(s) Prior to Admission medications   Medication Sig Start Date End Date Taking? Authorizing Provider  alum & mag hydroxide-simeth (MAALOX/MYLANTA) 200-200-20 MG/5ML suspension Take 30 mLs by mouth every 4 (four) hours as needed for indigestion. 05/06/23   Glade Lloyd, MD  ARIPiprazole (ABILIFY)  5 MG tablet Take 2.5 mg by mouth daily. 07/20/22   [provider]  atorvastatin (LIPITOR) 20 MG tablet Take 20 mg by mouth daily.    [provider]  Blood Glucose Monitoring Suppl DEVI 1 each by Does not apply route in the morning, at noon, and at bedtime. May substitute to any manufacturer covered by patient's insurance. 04/09/23   Glade Lloyd, MD  buPROPion (WELLBUTRIN XL) 300 MG 24 hr tablet Take 300 mg by mouth in the morning. 03/24/22   [provider]  dexamethasone (DECADRON) 4 MG tablet Take 1 tablet (4 mg total) by mouth daily. 05/07/23   Glade Lloyd, MD  dexlansoprazole (DEXILANT) 60 MG capsule Take 60 mg by mouth daily. 07/26/22   [provider]  diclofenac Sodium (VOLTAREN) 1 % GEL Apply 2 g topically 4 (four) times daily as needed (pain). 06/29/17   [provider]  FARXIGA 10 MG TABS tablet Take 10 mg by mouth daily. 08/10/22   [provider]  furosemide (LASIX) 40 MG tablet Take 1 tablet (40 mg total) by mouth daily. 02/28/23   Christell Constant, MD  gabapentin (NEURONTIN) 300 MG capsule Take  300 mg by mouth 3 (three) times daily. 08/19/22   [provider]  Glucose Blood (BLOOD GLUCOSE TEST STRIPS) STRP 1 each by In Vitro route in the morning, at noon, and at bedtime. May substitute to any manufacturer covered by patient's insurance. 04/09/23   Glade Lloyd, MD  hydrALAZINE (APRESOLINE) 25 MG tablet Take 1 tablet (25 mg total) by mouth daily as needed (BP>150). 04/26/23   Chandrasekhar, Mahesh A, MD  insulin glargine (LANTUS) 100 UNIT/ML Solostar Pen Inject 15 Units into the skin daily. 04/09/23   Glade Lloyd, MD  Insulin Pen Needle 31G X 5 MM MISC Lantus 15 units daily 04/09/23   Glade Lloyd, MD  lidocaine-prilocaine (EMLA) cream Apply to affected area once as directed. 05/05/23   Malachy Mood, MD  linagliptin (TRADJENTA) 5 MG TABS tablet Take 5 mg by mouth daily.    [provider]  metoprolol tartrate  (LOPRESSOR) 50 MG tablet Take 50 mg by mouth 2 (two) times daily.    [provider]  naloxone [facility]) nasal spray 4 mg/0.1 mL PRN for suspected opiate OD 04/24/23   Glyn Ade, MD  ondansetron (ZOFRAN) 8 MG tablet Take 1 tablet (8 mg total) by mouth every 8 (eight) hours as needed for nausea or vomiting. 05/05/23   Malachy Mood, MD  Oxycodone HCl 20 MG TABS Take 1 tablet (20 mg total) by mouth every 6 (six) hours as needed for breakthrough pain. 05/06/23   Glade Lloyd, MD  oxyCODONE ER (XTAMPZA ER) 36 MG C12A Take 1 capsule (36 mg total) by mouth every 8 (eight) hours. 05/06/23   Pickenpack-Cousar, Arty Baumgartner, NP  Oyster Shell (OYSTER CALCIUM) 500 MG TABS tablet Take 500 mg of elemental calcium by mouth daily.    [provider]  polyethylene glycol powder (GLYCOLAX/MIRALAX) 17 GM/SCOOP powder Take 17 g by mouth daily. 05/07/23   Glade Lloyd, MD  PREMARIN 0.45 MG tablet Take 0.45 mg by mouth daily. 01/11/22   [provider]  prochlorperazine (COMPAZINE) 10 MG tablet Take 1 tablet (10 mg total) by mouth every 6 (six) hours as needed for nausea or vomiting. 05/05/23   Malachy Mood, MD  QUEtiapine (SEROQUEL) 25 MG tablet Take 25 mg by mouth as needed. 03/17/23   [provider]  senna (SENOKOT) 8.6 MG TABS tablet Take 2 tablets (17.2 mg total) by mouth daily. 05/07/23   Glade Lloyd, MD  TIADYLT ER 300 MG 24 hr capsule Take 300 mg by mouth daily. 03/11/23   [provider]  tobramycin-dexamethasone Wallene Dales) ophthalmic solution 1 drop 4 (four) times daily. 04/14/23   [provider]  traZODone (DESYREL) 100 MG tablet Take 200 mg by mouth at bedtime. Pt takes 2 tablets 200 mg at bedtime.    [provider]  TRINTELLIX 10 MG TABS tablet Take 10 mg by mouth daily. 10/22/22   [provider]  warfarin (COUMADIN) 5 MG tablet TAKE 1 TABLET BY MOUTH DAILY EXCEPT 1.5 TABLETS BY MOUTH ON SUNDAYS AS DIRECTED BY COUMADIN CLINIC 05/03/23    Christell Constant, MD  Past Surgical History Past Surgical History:  Procedure Laterality Date   ABDOMINAL HYSTERECTOMY     at age 22, unknown reasons   BIOPSY  04/07/2023   Procedure: BIOPSY;  Surgeon: Meridee Score Netty Starring., MD;  Location: [facility] ENDOSCOPY;  Service: Gastroenterology;;   CARPAL TUNNEL RELEASE  12/07/2011   left side   COLONOSCOPY WITH PROPOFOL N/A 07/30/2022   Procedure: COLONOSCOPY WITH PROPOFOL;  Surgeon: Meryl Dare, MD;  Location[facility] ENDOSCOPY;  Service: Gastroenterology;  Laterality: N/A;   ESOPHAGOGASTRODUODENOSCOPY (EGD) WITH PROPOFOL N/A 04/07/2023   Procedure: ESOPHAGOGASTRODUODENOSCOPY (EGD) WITH PROPOFOL;  Surgeon: Meridee Score Netty Starring., MD;  Location: [facility] ENDOSCOPY;  Service: Gastroenterology;  Laterality: N/A;   EUS N/A 04/07/2023   Procedure: ESOPHAGEAL ENDOSCOPIC ULTRASOUND (EUS) RADIAL;  Surgeon: Meridee Score Netty Starring., MD;  Location: [facility] ENDOSCOPY;  Service: Gastroenterology;  Laterality: N/A;   EYE SURGERY     FINE NEEDLE ASPIRATION N/A 04/07/2023   Procedure: FINE NEEDLE ASPIRATION (FNA) LINEAR;  Surgeon: Lemar Lofty., MD;  Location: [facility] ENDOSCOPY;  Service: Gastroenterology;  Laterality: N/A;   FOOT SURGERY Bilateral    HOT HEMOSTASIS N/A 07/30/2022   Procedure: HOT HEMOSTASIS (ARGON PLASMA COAGULATION/BICAP);  Surgeon: Meryl Dare, MD;  Location: [facility] ENDOSCOPY;  Service: Gastroenterology;  Laterality: N/A;   IR IMAGING GUIDED PORT INSERTION  05/03/2023   MITRAL VALVE REPAIR N/A 04/19/2022   Procedure: MITRAL VALVE REPAIR WITH PHYSIO II ANNULOPLASTY RING;  Surgeon: Loreli Slot, MD;  Location: [facility] OR;  Service: Open Heart Surgery;  Laterality: N/A;   PERICARDIOCENTESIS N/A 05/10/2022   Procedure: PERICARDIOCENTESIS;  Surgeon: Corky Crafts, MD;  Location: [facility] INVASIVE CV LAB;  Service:  Cardiovascular;  Laterality: N/A;   POLYPECTOMY  07/30/2022   Procedure: POLYPECTOMY;  Surgeon: Meryl Dare, MD;  Location: [facility] ENDOSCOPY;  Service: Gastroenterology;;   RIGHT/LEFT HEART CATH AND CORONARY ANGIOGRAPHY N/A 02/12/2022   Procedure: RIGHT/LEFT HEART CATH AND CORONARY ANGIOGRAPHY;  Surgeon: Lyn Records, MD;  Location: [facility] INVASIVE CV LAB;  Service: Cardiovascular;  Laterality: N/A;   TEE WITHOUT CARDIOVERSION N/A 02/03/2022   Procedure: TRANSESOPHAGEAL ECHOCARDIOGRAM (TEE);  Surgeon: Wendall Stade, MD;  Location: [facility] ENDOSCOPY;  Service: Cardiovascular;  Laterality: N/A;   TEE WITHOUT CARDIOVERSION N/A 04/19/2022   Procedure: TRANSESOPHAGEAL ECHOCARDIOGRAM (TEE);  Surgeon: Loreli Slot, MD;  Location: [facility] OR;  Service: Open Heart Surgery;  Laterality: N/A;   TEE WITHOUT CARDIOVERSION N/A 07/28/2022   Procedure: TRANSESOPHAGEAL ECHOCARDIOGRAM (TEE);  Surgeon: Thurmon Fair, MD;  Location: [facility] ENDOSCOPY;  Service: Cardiovascular;  Laterality: N/A;   TRICUSPID VALVE REPLACEMENT N/A 04/19/2022   Procedure: TRICUSPID VALVE REPAIR WITH MC3 ANNULOPLASTY RING;  Surgeon: Loreli Slot, MD;  Location: [facility] OR;  Service: Open Heart Surgery;  Laterality: N/A;   Family History Family History  Problem Relation Age of Onset   Uterine cancer Mother    Alcohol abuse Father    Alcohol abuse Sister    Drug abuse Sister    Drug abuse Brother    Alcohol abuse Brother    Schizophrenia Son    Diabetes Other    Arthritis Other    Hypertension Other    Heart attack Son 48       Died suddenly    Social History Social History   Tobacco Use   Smoking status: Every Day    Packs/day: 0.25    Years: 35.00    Additional pack years: 0.00    Total pack years: 8.75  Types: Cigarettes   Smokeless tobacco: Never   Tobacco comments:    4 cigarettes a day  Vaping Use   Vaping Use: Some days  Substance Use Topics   Alcohol use: No   Drug use: No   Allergies Norco  [hydrocodone-acetaminophen]  Review of Systems Review of Systems  All other systems reviewed and are negative.   Physical Exam Vital Signs  I have reviewed the triage vital signs BP (!) 150/103   Pulse (!) 109   Temp 98 F (36.7 C)   Resp (!) 22   Ht 5\' 11"  (1.803 m)   Wt 78.5 kg   LMP 12/06/1976   SpO2 100%   BMI 24.13 kg/m  Physical Exam Vitals and nursing note reviewed.  Constitutional:      General: She is not in acute distress.    Comments: Cachectic   HENT:     Head: Normocephalic and atraumatic.     Mouth/Throat:     Mouth: Mucous membranes are dry.  Eyes:     Pupils: Pupils are equal, round, and reactive to light.  Cardiovascular:     Rate and Rhythm: Regular rhythm. Tachycardia present.     Heart sounds: No murmur heard. Pulmonary:     Effort: Pulmonary effort is normal. No respiratory distress.     Breath sounds: Normal breath sounds.  Abdominal:     General: Abdomen is flat.     Palpations: Abdomen is soft.     Tenderness: There is generalized abdominal tenderness. There is right CVA tenderness. There is no left CVA tenderness.  Musculoskeletal:        General: No tenderness.     Right lower leg: No edema.     Left lower leg: No edema.  Skin:    General: Skin is warm and dry.  Neurological:     General: No focal deficit present.     Mental Status: She is alert. Mental status is at baseline.  Psychiatric:        Mood and Affect: Mood normal.        Behavior: Behavior normal.     ED Results and Treatments Labs (all labs ordered are listed, but only abnormal results are displayed) Labs Reviewed  COMPREHENSIVE METABOLIC PANEL - Abnormal; Notable for the following components:      Result Value   Sodium 131 (*)    Glucose, Bld 288 (*)    BUN 25 (*)    Calcium 8.5 (*)    All other components within normal limits  LIPASE, BLOOD - Abnormal; Notable for the following components:   Lipase 52 (*)    All other components within normal limits   URINALYSIS, ROUTINE W REFLEX MICROSCOPIC - Abnormal; Notable for the following components:   APPearance CLOUDY (*)    Specific Gravity, Urine 1.035 (*)    Glucose, UA >=500 (*)    Protein, ur 30 (*)    Leukocytes,Ua LARGE (*)    Bacteria, UA MANY (*)    All other components within normal limits  I-STAT CHEM 8, ED - Abnormal; Notable for the following components:   Sodium 133 (*)    Chloride 97 (*)    BUN 27 (*)    Glucose, Bld 277 (*)    Calcium, Ion 1.10 (*)    All other components within normal limits  URINE CULTURE  CULTURE, BLOOD (ROUTINE X 2)  CULTURE, BLOOD (ROUTINE X 2)  CBC WITH DIFFERENTIAL/PLATELET  PROTIME-INR  Radiology CT ABDOMEN PELVIS W CONTRAST  Result Date: 05/10/2023 CLINICAL DATA:  Abdominal pain for several weeks, initial encounter EXAM: CT ABDOMEN AND PELVIS WITH CONTRAST TECHNIQUE: Multidetector CT imaging of the abdomen and pelvis was performed using the standard protocol following bolus administration of intravenous contrast. RADIATION DOSE REDUCTION: This exam was performed according to the departmental dose-optimization program which includes automated exposure control, adjustment of the mA and/or kV according to patient size and/or use of iterative reconstruction technique. CONTRAST:  OMNIPAQUE IOHEXOL 300 MG/ML  SOLN COMPARISON:  04/24/2023 FINDINGS: Lower chest: Lung bases show no focal infiltrate or sizable effusion. A pleural based nodule is noted which measures approximately 6 mm which is stable from the prior exam. Hepatobiliary: Multiple peripherally enhancing lesions are identified throughout the liver similar to that seen on prior CT examination consistent with metastatic disease. The gallbladder is decompressed. Pancreas: Pancreas again demonstrates a predominately hypodense mass lesion within the body and tail of the pancreas  similar to that seen on prior CT examination consistent with the known pancreatic malignancy. Spleen: Normal in size without focal abnormality. Adrenals/Urinary Tract: Adrenal glands are within normal limits. Kidneys demonstrate a normal enhancement pattern bilaterally. Normal excretion is noted. No renal calculi or obstructive changes are seen. The bladder is decompressed. There is air in within the anterior aspect of the bladder wall suspicious for emphysematous cystitis. Correlate with urinalysis. These changes are new from the prior exam. Stomach/Bowel: No obstructive or inflammatory changes of the colon are seen. The appendix is not well visualized and may have been surgically removed. No inflammatory changes are seen. Small bowel and stomach are unremarkable. Vascular/Lymphatic: Atherosclerotic calcifications of the abdominal aorta are noted. No aneurysmal dilatation is seen. There is again noted encasement of the celiac axis as well as a fat plane adjacent to the superior mesenteric artery consistent with localized neoplastic invasion. 18 mm short axis node is noted in the portacaval space stable from the prior exam. Splenic vein occlusion is not again seen with some collateral flow identified. Reproductive: Status post hysterectomy. No adnexal masses. Other: No abdominal wall hernia or abnormality. No abdominopelvic ascites. Musculoskeletal: Degenerative changes of lumbar spine are seen. IMPRESSION: Changes consistent with the known history of pancreatic neoplasm stable in appearance from the recent exam. Associated vascular encasement is noted as well as splenic vein occlusion and adjacent portacaval adenopathy. Hepatic metastatic disease is again identified and stable. Stable 6 mm subpleural nodule in the right lower lobe. This is stable dating back to January of 2022 and likely of a benign etiology. No acute abnormality is noted. Electronically Signed   By: Alcide Clever M.D.   On: 05/10/2023 19:26     Pertinent labs & imaging results that were available during my care of the patient were reviewed by me and considered in my medical decision making (see MDM for details).  Medications Ordered in ED Medications  cefTRIAXone (ROCEPHIN) 1 g in sodium chloride 0.9 % 100 mL IVPB (has no administration in time range)  HYDROmorphone (DILAUDID) injection 1 mg (has no administration in time range)  HYDROmorphone (DILAUDID) injection 0.5 mg (0.5 mg Intravenous Given 05/10/23 1757)  ondansetron (ZOFRAN) injection 4 mg (4 mg Intravenous Given 05/10/23 1757)  sodium chloride 0.9 % bolus 1,000 mL (1,000 mLs Intravenous New Bag/Given 05/10/23 1756)  iohexol (OMNIPAQUE) 300 MG/ML solution 100 mL (100 mLs Intravenous Contrast Given 05/10/23 1854)  sodium chloride 0.9 % bolus 1,000 mL (1,000 mLs Intravenous New Bag/Given 05/10/23 2027)  Procedures Procedures  (including critical care time)  Medical Decision Making / ED Course   MDM:  62 year old female presenting to the emergency department with abdominal pain.  Patient appears mildly dehydrated, has diffuse abdominal tenderness.  Differential includes cancer related pain, obstruction, perforation, abscess, volvulus, pancreatitis. Also having urinary symptoms and CVA TTP so differential includes pyelonephritis. Given 1 week of symptoms low concern for acute vascular process.  Will treat pain, give IV fluids and reassess.  Clinical Course as of 05/10/23 2042  Tue May 10, 2023  2034 Discussed with Dr. Margo Aye who will admit the patient. Workup with stable CT scan without acute process. UA is positive and patient has CVA tenderness. Has some persistent tachycardia despite fluids. Requiring additional pain medication. Could be early sepsis vs SIRS. Will obtain blood cultures. Urine culture sent. Will admit [WS]    Clinical Course User  Index [WS] Lonell Grandchild, MD     Additional history obtained: -Additional history obtained from ems -External records from outside source obtained and reviewed including: Chart review including previous notes, labs, imaging, consultation notes including prior oncology notes   Lab Tests: -I ordered, reviewed, and interpreted labs.   The pertinent results include:   Labs Reviewed  COMPREHENSIVE METABOLIC PANEL - Abnormal; Notable for the following components:      Result Value   Sodium 131 (*)    Glucose, Bld 288 (*)    BUN 25 (*)    Calcium 8.5 (*)    All other components within normal limits  LIPASE, BLOOD - Abnormal; Notable for the following components:   Lipase 52 (*)    All other components within normal limits  URINALYSIS, ROUTINE W REFLEX MICROSCOPIC - Abnormal; Notable for the following components:   APPearance CLOUDY (*)    Specific Gravity, Urine 1.035 (*)    Glucose, UA >=500 (*)    Protein, ur 30 (*)    Leukocytes,Ua LARGE (*)    Bacteria, UA MANY (*)    All other components within normal limits  I-STAT CHEM 8, ED - Abnormal; Notable for the following components:   Sodium 133 (*)    Chloride 97 (*)    BUN 27 (*)    Glucose, Bld 277 (*)    Calcium, Ion 1.10 (*)    All other components within normal limits  URINE CULTURE  CULTURE, BLOOD (ROUTINE X 2)  CULTURE, BLOOD (ROUTINE X 2)  CBC WITH DIFFERENTIAL/PLATELET  PROTIME-INR    Notable for + UA. No leukocytosis. Mild hyponatremia. Borderline lipase without signs of pancreatitis  EKG   EKG Interpretation  Date/Time:  Tuesday May 10 2023 17:36:04 EDT Ventricular Rate:  106 PR Interval:  162 QRS Duration: 98 QT Interval:  354 QTC Calculation: 471 R Axis:   20 Text Interpretation: Sinus tachycardia with PACs Nonspecific T abnrm, anterolateral leads Compared to previous tracing T wave changes have improved Confirmed by Alvino Blood ([id number]) on 05/10/2023 5:45:45 PM         Imaging Studies  ordered: I ordered imaging studies including CT Abd On my interpretation imaging demonstrates no acute process. No renal stone I independently visualized and interpreted imaging. I agree with the radiologist interpretation   Medicines ordered and prescription drug management: Meds ordered this encounter  Medications   HYDROmorphone (DILAUDID) injection 0.5 mg   ondansetron (ZOFRAN) injection 4 mg   sodium chloride 0.9 % bolus 1,000 mL   iohexol (OMNIPAQUE) 300 MG/ML solution 100 mL   cefTRIAXone (ROCEPHIN) 1 g in  sodium chloride 0.9 % 100 mL IVPB    Order Specific Question:   Antibiotic Indication:    Answer:   UTI   DISCONTD: HYDROmorphone (DILAUDID) injection 0.5 mg   HYDROmorphone (DILAUDID) injection 1 mg   sodium chloride 0.9 % bolus 1,000 mL    -I have reviewed the patients home medicines and have made adjustments as needed   Consultations Obtained: I requested consultation with the hospitalist,  and discussed lab and imaging findings as well as pertinent plan - they recommend: admission   Cardiac Monitoring: The patient was maintained on a cardiac monitor.  I personally viewed and interpreted the cardiac monitored which showed an underlying rhythm of: sinus tachycardia with frequent PAC  Social Determinants of Health:  Diagnosis or treatment significantly limited by social determinants of health: lives alone   Reevaluation: After the interventions noted above, I reevaluated the patient and found that their symptoms have improved  Co morbidities that complicate the patient evaluation  Past Medical History:  Diagnosis Date   Acute pericardial effusion 05/17/2022   AKI (acute kidney injury) (HCC) 05/17/2022   Anxiety    Arthritis    Cervicalgia    CHF (congestive heart failure) (HCC)    Chondromalacia of patella    Chronic neck pain    Chronic pain syndrome    Depression    secondary to loss of her son at age 69   Diabetic neuropathy (HCC)    DMII (diabetes  mellitus, type 2) (HCC)    Dysrhythmia    Enthesopathy of hip region    Fibromyalgia    GERD (gastroesophageal reflux disease)    Hep C w/o coma, chronic (HCC)    Hepatitis C    diagnosed 2005   Hypertension    Insomnia    Low back pain    Lumbago    Mitral regurgitation    Obesity    Pain in joint, upper arm    Primary localized osteoarthrosis, lower leg    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    Final Clinical Impression(s) / ED Diagnoses Final diagnoses:  Pyelonephritis     This chart was dictated using voice recognition software.  Despite best efforts to proofread,  errors can occur which can change the documentation meaning.    Lonell Grandchild, MD 05/10/23 2042

## 2023-05-10 NOTE — Progress Notes (Deleted)
Palliative Medicine [facility]  [phone] [phone]) [phone]   Name: [name] Date: 05/10/2023 MRN: [id number]  DOB: 08-01-1961  Patient Care Team: Chrys Racer, MD as PCP - General (Internal Medicine) Christell Constant, MD as PCP - Cardiology (Cardiology) Blair Promise, OD as Consulting Physician (Optometry) Christell Constant, MD as Consulting Physician (Cardiology) Malachy Mood, MD as Consulting Physician (Oncology)    REASON FOR CONSULTATION: [name] is a 62 y.o. female with oncologic medical history including pancreatic adenocarcinoma (04/2023) with metastatic disease to the liver, as well as rheumatic heart disease status post MV and TV repair (04/2022), history of pericardial effusion/tamponade status post pericardiocentesis (05/2022), PAF on Coumadin, CKD, COPD, type 2 diabetes, hypertension.  Palliative ask to see for symptom management and goals of care.    SOCIAL HISTORY:     reports that she has been smoking cigarettes. She has a 8.75 pack-year smoking history. She has never used smokeless tobacco. She reports that she does not drink alcohol and does not use drugs.  ADVANCE DIRECTIVES:  Advanced directives on file naming [name] as medical decision maker should the patient become unable to make their own decisions.  CODE STATUS: Full code  PAST MEDICAL HISTORY: Past Medical History:  Diagnosis Date   Acute pericardial effusion 05/17/2022   AKI (acute kidney injury) (HCC) 05/17/2022   Anxiety    Arthritis    Cervicalgia    CHF (congestive heart failure) (HCC)    Chondromalacia of patella    Chronic neck pain    Chronic pain syndrome    Depression    secondary to loss of her son at age 36   Diabetic neuropathy (HCC)    DMII (diabetes mellitus, type 2) (HCC)    Dysrhythmia    Enthesopathy of hip region    Fibromyalgia    GERD (gastroesophageal reflux disease)    Hep C w/o coma,  chronic (HCC)    Hepatitis C    diagnosed 2005   Hypertension    Insomnia    Low back pain    Lumbago    Mitral regurgitation    Obesity    Pain in joint, upper arm    Primary localized osteoarthrosis, lower leg    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   Substance abuse (HCC)    sober since 2001   Tobacco abuse    Tricuspid regurgitation    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    PAST SURGICAL HISTORY:  Past Surgical History:  Procedure Laterality Date   ABDOMINAL HYSTERECTOMY     at age 46, unknown reasons   BIOPSY  04/07/2023   Procedure: BIOPSY;  Surgeon: Lemar Lofty., MD;  Location: [facility];  Service: Gastroenterology;;   Fidela Salisbury RELEASE  12/07/2011   left side   COLONOSCOPY WITH PROPOFOL N/A 07/30/2022   Procedure: COLONOSCOPY WITH PROPOFOL;  Surgeon: Meryl Dare, MD;  Location: [facility] ENDOSCOPY;  Service: Gastroenterology;  Laterality: N/A;   ESOPHAGOGASTRODUODENOSCOPY (EGD) WITH PROPOFOL N/A 04/07/2023   Procedure: ESOPHAGOGASTRODUODENOSCOPY (EGD) WITH PROPOFOL;  Surgeon: Meridee Score Netty Starring., MD;  Location: [facility] ENDOSCOPY;  Service: Gastroenterology;  Laterality: N/A;   EUS N/A 04/07/2023   Procedure: ESOPHAGEAL ENDOSCOPIC ULTRASOUND (EUS) RADIAL;  Surgeon: Meridee Score Netty Starring., MD;  Location: [facility] ENDOSCOPY;  Service: Gastroenterology;  Laterality: N/A;   EYE SURGERY     FINE NEEDLE ASPIRATION N/A 04/07/2023   Procedure: FINE NEEDLE ASPIRATION (FNA) LINEAR;  Surgeon: Lemar Lofty.,  MD;  Location: [facility] ENDOSCOPY;  Service: Gastroenterology;  Laterality: N/A;   FOOT SURGERY Bilateral    HOT HEMOSTASIS N/A 07/30/2022   Procedure: HOT HEMOSTASIS (ARGON PLASMA COAGULATION/BICAP);  Surgeon: Meryl Dare, MD;  Location: [facility] ENDOSCOPY;  Service: Gastroenterology;  Laterality: N/A;   IR IMAGING GUIDED PORT INSERTION  05/03/2023   MITRAL VALVE REPAIR N/A 04/19/2022   Procedure: MITRAL VALVE REPAIR WITH PHYSIO II ANNULOPLASTY  RING;  Surgeon: Loreli Slot, MD;  Location: [facility] OR;  Service: Open Heart Surgery;  Laterality: N/A;   PERICARDIOCENTESIS N/A 05/10/2022   Procedure: PERICARDIOCENTESIS;  Surgeon: Corky Crafts, MD;  Location: [facility] INVASIVE CV LAB;  Service: Cardiovascular;  Laterality: N/A;   POLYPECTOMY  07/30/2022   Procedure: POLYPECTOMY;  Surgeon: Meryl Dare, MD;  Location: [facility] ENDOSCOPY;  Service: Gastroenterology;;   RIGHT/LEFT HEART CATH AND CORONARY ANGIOGRAPHY N/A 02/12/2022   Procedure: RIGHT/LEFT HEART CATH AND CORONARY ANGIOGRAPHY;  Surgeon: Lyn Records, MD;  Location: [facility] INVASIVE CV LAB;  Service: Cardiovascular;  Laterality: N/A;   TEE WITHOUT CARDIOVERSION N/A 02/03/2022   Procedure: TRANSESOPHAGEAL ECHOCARDIOGRAM (TEE);  Surgeon: Wendall Stade, MD;  Location: [facility] ENDOSCOPY;  Service: Cardiovascular;  Laterality: N/A;   TEE WITHOUT CARDIOVERSION N/A 04/19/2022   Procedure: TRANSESOPHAGEAL ECHOCARDIOGRAM (TEE);  Surgeon: Loreli Slot, MD;  Location: [facility] OR;  Service: Open Heart Surgery;  Laterality: N/A;   TEE WITHOUT CARDIOVERSION N/A 07/28/2022   Procedure: TRANSESOPHAGEAL ECHOCARDIOGRAM (TEE);  Surgeon: Thurmon Fair, MD;  Location: [facility] ENDOSCOPY;  Service: Cardiovascular;  Laterality: N/A;   TRICUSPID VALVE REPLACEMENT N/A 04/19/2022   Procedure: TRICUSPID VALVE REPAIR WITH MC3 ANNULOPLASTY RING;  Surgeon: Loreli Slot, MD;  Location: [facility] OR;  Service: Open Heart Surgery;  Laterality: N/A;    HEMATOLOGY/ONCOLOGY HISTORY:  Oncology History  Pancreatic cancer (HCC)  04/03/2023 Imaging   CT A/P W CONTRAST IMPRESSION: 1. Hypoenhancing masslike lesion in the tail of the pancreas measuring 9.9 x 2.9 cm. Mild surrounding fat stranding is noted. Differential diagnosis includes pancreatic adenocarcinoma versus pancreatitis. Correlation with amylase, lipase, and CA 19-9 is recommended. 2. The portal vein and superior mesenteric vein are patent. The splenic  vein is not visualized on exam and may be occluded. 3. 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 4. Scattered ill-defined hypodensities in the liver new from the previous exam in the possibility of underlying metastatic disease can not be excluded. MRI with contrast is recommended for further evaluation. 5. Aortic atherosclerosis. 6. Stable right lower lobe pulmonary nodules measuring up to 1.0 cm.  7. Remaining incidental findings as described above.   04/04/2023 Imaging   MR ABD IMPRESSION: 1. There is a large hypoenhancing mass involving the body and tail of pancreasa compatible with pancreatic adenocarcinoma. 2. 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 At least partial encasement of the left side of the splenic artery is suspected. The portal vein, portal venous confluence and SMV do not appear to be involved. The splenic vein appears completely occluded. 3. Multiple rim enhancing lesions are identified within the liver compatible with metastatic disease. 4. Lymph node within the porta hepatic region measures 1.3 cm. Suspicious for nodal metastasis. 5. Subpleural nodule within the right lower lobe measures 7 mm. Consider further evaluation with dedicated CT of the chest.   04/04/2023 Tumor Marker   CA 19-9: 77  (normal 0-35)   04/05/2023 Imaging   CT CHEST IMPRESSION:  1. Nonspecific small solid pulmonary nodules, largest of the left lower lobe measures 9 x 6 mm. Recommend short-term follow-up chest CT in 3 months for further evaluation. 2. No evidence nodal metastatic disease in the chest. 3. Partially visualized pancreatic tail mass and hypoattenuating liver lesions, better evaluated on recently performed MRI of the abdomen and CT of the abdomen and pelvis. 4. Aortic Atherosclerosis (ICD10-I70.0).   04/07/2023 Cancer Staging   Staging form: Exocrine Pancreas, AJCC 8th Edition -  Clinical stage from 04/07/2023: Stage IV (cT4, cN0, cM1) - Signed by Pollyann Samples, NP on 04/12/2023 Total positive nodes: 0   04/07/2023 Procedure   Upper endoscopic ultrasound with cytology by Dr. Meridee Score: A mass was identified in the pancreatic body/tail.  Cytology results are pending.  However the endosonographic appearance is suspicious for adenocarcinoma with distal pancreatic duct obstruction and dilation.  This was staged T4 N0 MX by EUS criteria as a result of the vasculature.  The staging applies if malignancy is confirmed   04/12/2023 Initial Diagnosis   Pancreatic cancer [facility])   Pancreatic cancer metastasized to liver [facility])  04/28/2023 Initial Diagnosis   Pancreatic cancer metastasized to liver (HCC)   05/13/2023 -  Chemotherapy   Patient is on Treatment Plan : PANCREATIC Abraxane D1,8,15 + Gemcitabine D1,8,15 q28d       ALLERGIES:  is allergic to norco [hydrocodone-acetaminophen] and hydrocodone.  MEDICATIONS:  Current Outpatient Medications  Medication Sig Dispense Refill   alum & mag hydroxide-simeth (MAALOX/MYLANTA) 200-200-20 MG/5ML suspension Take 30 mLs by mouth every 4 (four) hours as needed for indigestion. 355 mL 0   ARIPiprazole (ABILIFY) 5 MG tablet Take 2.5 mg by mouth daily.     atorvastatin (LIPITOR) 20 MG tablet Take 20 mg by mouth daily.     Blood Glucose Monitoring Suppl DEVI 1 each by Does not apply route in the morning, at noon, and at bedtime. May substitute to any manufacturer covered by patient's insurance. (Patient not taking: Reported on 04/27/2023) 1 each 0   buPROPion (WELLBUTRIN XL) 300 MG 24 hr tablet Take 300 mg by mouth in the morning.     dexamethasone (DECADRON) 4 MG tablet Take 1 tablet (4 mg total) by mouth daily. 30 tablet 0   dexlansoprazole (DEXILANT) 60 MG capsule Take 60 mg by mouth daily.     diclofenac Sodium (VOLTAREN) 1 % GEL Apply 2 g topically 4 (four) times daily as needed (pain).     FARXIGA 10 MG TABS tablet Take 10 mg by mouth  daily.     furosemide (LASIX) 40 MG tablet Take 1 tablet (40 mg total) by mouth daily. 90 tablet 3   gabapentin (NEURONTIN) 300 MG capsule Take 300 mg by mouth 3 (three) times daily.     Glucose Blood (BLOOD GLUCOSE TEST STRIPS) STRP 1 each by In Vitro route in the morning, at noon, and at bedtime. May substitute to any manufacturer covered by patient's insurance. (Patient not taking: Reported on 04/27/2023) 100 each 0   hydrALAZINE (APRESOLINE) 25 MG tablet Take 1 tablet (25 mg total) by mouth daily as needed (BP>150). 30 tablet 3   insulin glargine (LANTUS) 100 UNIT/ML Solostar Pen Inject 15 Units into the skin daily. 15 mL 0   Insulin Pen Needle 31G X 5 MM MISC Lantus 15 units daily (Patient not taking: Reported on 04/27/2023) 100 each 0   lidocaine-prilocaine (EMLA) cream Apply to affected area once as directed. 30 g 3   linagliptin (TRADJENTA) 5 MG TABS  tablet Take 5 mg by mouth daily.     metoprolol tartrate (LOPRESSOR) 50 MG tablet Take 50 mg by mouth 2 (two) times daily.     naloxone (NARCAN) nasal spray 4 mg/0.1 mL PRN for suspected opiate OD (Patient not taking: Reported on 04/27/2023) 1 each 0   ondansetron (ZOFRAN) 8 MG tablet Take 1 tablet (8 mg total) by mouth every 8 (eight) hours as needed for nausea or vomiting. 30 tablet 1   Oxycodone HCl 20 MG TABS Take 1 tablet (20 mg total) by mouth every 6 (six) hours as needed for breakthrough pain. 20 tablet 0   oxyCODONE ER (XTAMPZA ER) 36 MG C12A Take 1 capsule (36 mg total) by mouth every 8 (eight) hours. 45 capsule 0   Oyster Shell (OYSTER CALCIUM) 500 MG TABS tablet Take 500 mg of elemental calcium by mouth daily. (Patient not taking: Reported on 04/27/2023)     polyethylene glycol powder (GLYCOLAX/MIRALAX) 17 GM/SCOOP powder Take 17 g by mouth daily. 238 g 0   PREMARIN 0.45 MG tablet Take 0.45 mg by mouth daily.     prochlorperazine (COMPAZINE) 10 MG tablet Take 1 tablet (10 mg total) by mouth every 6 (six) hours as needed for nausea or  vomiting. 30 tablet 1   QUEtiapine (SEROQUEL) 25 MG tablet Take 25 mg by mouth as needed.     senna (SENOKOT) 8.6 MG TABS tablet Take 2 tablets (17.2 mg total) by mouth daily. 30 tablet 0   TIADYLT ER 300 MG 24 hr capsule Take 300 mg by mouth daily.     tobramycin-dexamethasone (TOBRADEX) ophthalmic solution 1 drop 4 (four) times daily.     traZODone (DESYREL) 100 MG tablet Take 200 mg by mouth at bedtime. Pt takes 2 tablets 200 mg at bedtime.     TRINTELLIX 10 MG TABS tablet Take 10 mg by mouth daily.     warfarin (COUMADIN) 5 MG tablet TAKE 1 TABLET BY MOUTH DAILY EXCEPT 1.5 TABLETS BY MOUTH ON SUNDAYS AS DIRECTED BY COUMADIN CLINIC 105 tablet 1   No current facility-administered medications for this visit.    VITAL SIGNS: LMP 12/06/1976  There were no vitals filed for this visit.  Estimated body mass index is 24.83 kg/m as calculated from the following:   Height as of 04/26/23: 5\' 11"  (1.803 m).   Weight as of 04/26/23: 178 lb (80.7 kg).  LABS: CBC:    Component Value Date/Time   WBC 6.7 05/06/2023 0635   HGB 12.3 05/06/2023 0635   HGB 9.5 (L) 12/20/2022 1231   HCT 35.8 (L) 05/06/2023 0635   HCT 31.7 (L) 12/20/2022 1231   PLT 150 05/06/2023 0635   PLT 256 12/20/2022 1231   MCV 90.2 05/06/2023 0635   MCV 82 12/20/2022 1231   NEUTROABS 3.3 04/24/2023 1756   LYMPHSABS 1.4 04/24/2023 1756   MONOABS 0.4 04/24/2023 1756   EOSABS 0.1 04/24/2023 1756   BASOSABS 0.0 04/24/2023 1756   Comprehensive Metabolic Panel:    Component Value Date/Time   NA 131 (L) 05/06/2023 0635   NA 141 02/28/2023 0958   K 3.3 (L) 05/06/2023 0635   CL 94 (L) 05/06/2023 0635   CO2 28 05/06/2023 0635   BUN 20 05/06/2023 0635   BUN 29 (H) 02/28/2023 0958   CREATININE 0.74 05/06/2023 0635   CREATININE 1.28 (H) 01/02/2021 0942   GLUCOSE 112 (H) 05/06/2023 0635   CALCIUM 8.5 (L) 05/06/2023 0635   AST 23 05/02/2023 0751   ALT 24  05/02/2023 0751   ALKPHOS 96 05/02/2023 0751   BILITOT 0.6 05/02/2023  0751   PROT 7.1 05/02/2023 0751   ALBUMIN 3.8 05/02/2023 0751    RADIOGRAPHIC STUDIES:  CT ABDOMEN PELVIS W CONTRAST  Result Date: 04/24/2023 CLINICAL DATA:  Abdominal pain.  Metastatic pancreatic cancer. EXAM: CT ABDOMEN AND PELVIS WITH CONTRAST TECHNIQUE: Multidetector CT imaging of the abdomen and pelvis was performed using the standard protocol following bolus administration of intravenous contrast. RADIATION DOSE REDUCTION: This exam was performed according to the departmental dose-optimization program which includes automated exposure control, adjustment of the mA and/or kV according to patient size and/or use of iterative reconstruction technique. CONTRAST:  OMNIPAQUE IOHEXOL 300 MG/ML  SOLN COMPARISON:  CT abdomen pelvis dated 04/03/2023. FINDINGS: Lower chest: Similar appearance of right lung base subpleural nodules measure up to 8 mm. Mitral and aortic valve repairs. No intra-abdominal free air or free fluid. Hepatobiliary: Multiple hypoenhancing hepatic metastatic disease. These lesions appear more conspicuous or have increased since the prior CT. No biliary dilatation. The gallbladder is unremarkable. Pancreas: Hypoenhancing mass involving the body and tail of the pancreas as seen previously in keeping with known malignancy. Spleen: Normal in size without focal abnormality. Adrenals/Urinary Tract: The adrenal glands are unremarkable. There is no hydronephrosis on either side. There is symmetric enhancement and excretion of contrast by both kidneys. The visualized ureters and the bladder appear unremarkable. Stomach/Bowel: Postsurgical changes of bowel with anastomotic suture in the pelvis. There is no bowel obstruction or active inflammation. The appendix is not visualized with certainty. No inflammatory changes identified in the right lower quadrant. Vascular/Lymphatic: Mild aortoiliac atherosclerotic disease. There is tumor encasement of the celiac trunk. There is infiltration of the fat  plane around the proximal SMA suggestive of encasement. The celiac artery, SMA, IMA are patent. The renal arteries are patent. There is in case mint of the proximal half of the splenic artery with mass effect and high-grade luminal narrowing. The splenic vein is not visualized and appears thrombosed. The SMV and main portal vein are patent. A 2.7 x 2.0 cm hypoenhancing soft tissue abutting the main portal vein (25/2) may represent an enlarged and metastatic adenopathy versus infiltrative tumor. No portal venous gas. Reproductive: Hysterectomy.  No adnexal masses. Other: Midline vertical anterior pelvic wall incisional scar. Musculoskeletal: Degenerative changes of the spine. No acute osseous pathology. IMPRESSION: 1. No acute intra-abdominal or pelvic pathology. 2. Hypoenhancing mass involving the body and tail of the pancreas in keeping with known malignancy. 3. Multiple hypoenhancing hepatic metastatic disease. These lesions appear more conspicuous or have increased since the prior CT. 4. 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 6. Similar appearance of right lung base subpleural nodules measure up to 8 mm. 7.  Aortic Atherosclerosis (ICD10-I70.0). Electronically Signed   By: Elgie Collard M.D.   On: 04/24/2023 21:06    PERFORMANCE STATUS (ECOG) : {CHL ONC ECOG ZO:[id number]}  Review of Systems Unless otherwise noted, a complete review of systems is negative.  Physical Exam General: NAD Cardiovascular: regular rate and rhythm Pulmonary: clear ant fields Abdomen: soft, nontender, + bowel sounds Extremities: no edema, no joint deformities Skin: no rashes Neurological:  IMPRESSION: *** I introduced myself, Marguerette Sheller RN, and Palliative's role in collaboration with the oncology team. Concept of Palliative Care was introduced as specialized medical care for people and their families living with serious illness.  It focuses on providing relief  from the symptoms and stress  of a serious illness.  The goal is to improve quality of life for both the patient and the family. Values and goals of care important to patient and family were attempted to be elicited.    We discussed *** current illness and what it means in the larger context of *** on-going co-morbidities. Natural disease trajectory and expectations were discussed.  I discussed the importance of continued conversation with family and their medical providers regarding overall plan of care and treatment options, ensuring decisions are within the context of the patients values and GOCs.  PLAN: Established therapeutic relationship. Education provided on palliative's role in collaboration with their Oncology/Radiation team. I will plan to see patient back in 2-4 weeks in collaboration to other oncology appointments.    Patient expressed understanding and was in agreement with this plan. She also understands that She can call the clinic at any time with any questions, concerns, or complaints.   Thank you for your referral and allowing Palliative to assist in [name]'s care.   Number and complexity of problems addressed: ***HIGH - 1 or more chronic illnesses with SEVERE exacerbation, progression, or side effects of treatment - advanced cancer, pain. Any controlled substances utilized were prescribed in the context of palliative care.   Visit consisted of counseling and education dealing with the complex and emotionally intense issues of symptom management and palliative care in the setting of serious and potentially life-threatening illness.Greater than 50%  of this time was spent counseling and coordinating care related to the above assessment and plan.  Signed by: Willette Alma, AGPCNP-BC Palliative Medicine Team/[facility]   *Please note that this is a verbal dictation therefore any spelling or grammatical errors are due to the "[employer]" system interpretation.

## 2023-05-10 NOTE — H&P (Signed)
History and Physical  [name] ZOX:[id number] DOB: 10-20-1961 DOA: 05/10/2023  Referring physician: Dr. Suezanne Jacquet, EDP  PCP: Chrys Racer, MD  Outpatient Specialists: Oncology, cardiology, GI. Patient coming from: Home  Chief Complaint: Diffuse abdominal pain, right flank pain.  HPI: [name] is a 62 y.o. female with medical history significant for rheumatic heart disease status post mitral valve and transcatheter valve repair in May 2023, history of pericardial effusion/tamponade status post pericardiocentesis in June 2023, paroxysmal A-fib on Coumadin, CKD 3A, COPD, type 2 diabetes, hypertension, newly diagnosed metastasized pancreatic adenocarcinoma, recently admitted on 04/26/2023 and discharged on 05/06/2023 for intractable abdominal pain, who presented to [facility] ED from home with complaints of diffuse abdominal pain and right flank pain.  In the ED, frail-appearing.  UA positive for pyuria, with right flank tenderness on exam.  Contrast CT abdomen pelvis revealed changes consistent with known history of pancreatic neoplasm, stable in appearance from the recent exam.  Hepatic metastatic disease is again identified and stable.  Stable 6 mm subpleural nodule in the right lower lobe.  Urine culture was obtained and the patient was started on empiric IV antibiotics Rocephin.  TRH, hospitalist service, was asked to admit.  ED Course: Temperature 98.  BP 141/106, pulse 117, respiratory 14, O2 saturation 100% on room air.  Lab studies markable for CBC unremarkable, serum sodium 133, blood glucose 277, BUN 27, creatinine 0.70.  Review of Systems: Review of systems as noted in the HPI. All other systems reviewed and are negative.   Past Medical History:  Diagnosis Date   Acute pericardial effusion 05/17/2022   AKI (acute kidney injury) (HCC) 05/17/2022   Anxiety    Arthritis    Cervicalgia    CHF (congestive heart failure) (HCC)    Chondromalacia of patella    Chronic neck  pain    Chronic pain syndrome    Depression    secondary to loss of her son at age 74   Diabetic neuropathy (HCC)    DMII (diabetes mellitus, type 2) (HCC)    Dysrhythmia    Enthesopathy of hip region    Fibromyalgia    GERD (gastroesophageal reflux disease)    Hep C w/o coma, chronic (HCC)    Hepatitis C    diagnosed 2005   Hypertension    Insomnia    Low back pain    Lumbago    Mitral regurgitation    Obesity    Pain in joint, upper arm    Primary localized osteoarthrosis, lower leg    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   Substance abuse (HCC)    sober since 2001   Tobacco abuse    Tricuspid regurgitation    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   Past Surgical History:  Procedure Laterality Date   ABDOMINAL HYSTERECTOMY     at age 62, unknown reasons   BIOPSY  04/07/2023   Procedure: BIOPSY;  Surgeon: Lemar Lofty., MD;  Location: [facility] END[facility];  Service: Gastroenterology;;   Fidela Salisbury RELEASE  12/07/2011   left side   COLONOSCOPY WITH PROPOFOL N/A 07/30/2022   Procedure: COLONOSCOPY WITH PROPOFOL;  Surgeon: Meryl Dare, MD;  Location: [facility] ENDOSCOPY;  Service: Gastroenterology;  Laterality: N/A;   ESOPHAGOGASTRODUODENOSCOPY (EGD) WITH PROPOFOL N/A 04/07/2023   Procedure: ESOPHAGOGASTRODUODENOSCOPY (EGD) WITH PROPOFOL;  Surgeon: Meridee Score Netty Starring., MD;  Location: [facility] ENDOSCOPY;  Service: Gastroenterology;  Laterality: N/A;   EUS N/A 04/07/2023   Procedure: ESOPHAGEAL ENDOSCOPIC ULTRASOUND (EUS) RADIAL;  Surgeon:  Mansouraty, Netty Starring., MD;  Location: [facility] ENDOSCOPY;  Service: Gastroenterology;  Laterality: N/A;   EYE SURGERY     FINE NEEDLE ASPIRATION N/A 04/07/2023   Procedure: FINE NEEDLE ASPIRATION (FNA) LINEAR;  Surgeon: Lemar Lofty., MD;  Location: [facility] ENDOSCOPY;  Service: Gastroenterology;  Laterality: N/A;   FOOT SURGERY Bilateral    HOT HEMOSTASIS N/A 07/30/2022   Procedure: HOT HEMOSTASIS (ARGON PLASMA  COAGULATION/BICAP);  Surgeon: Meryl Dare, MD;  Location: [facility] ENDOSCOPY;  Service: Gastroenterology;  Laterality: N/A;   IR IMAGING GUIDED PORT INSERTION  05/03/2023   MITRAL VALVE REPAIR N/A 04/19/2022   Procedure: MITRAL VALVE REPAIR WITH PHYSIO II ANNULOPLASTY RING;  Surgeon: Loreli Slot, MD;  Location: [facility] OR;  Service: Open Heart Surgery;  Laterality: N/A;   PERICARDIOCENTESIS N/A 05/10/2022   Procedure: PERICARDIOCENTESIS;  Surgeon: Corky Crafts, MD;  Location: [facility] INVASIVE CV LAB;  Service: Cardiovascular;  Laterality: N/A;   POLYPECTOMY  07/30/2022   Procedure: POLYPECTOMY;  Surgeon: Meryl Dare, MD;  Location: [facility] ENDOSCOPY;  Service: Gastroenterology;;   RIGHT/LEFT HEART CATH AND CORONARY ANGIOGRAPHY N/A 02/12/2022   Procedure: RIGHT/LEFT HEART CATH AND CORONARY ANGIOGRAPHY;  Surgeon: Lyn Records, MD;  Location: [facility] INVASIVE CV LAB;  Service: Cardiovascular;  Laterality: N/A;   TEE WITHOUT CARDIOVERSION N/A 02/03/2022   Procedure: TRANSESOPHAGEAL ECHOCARDIOGRAM (TEE);  Surgeon: Wendall Stade, MD;  Location: [facility] ENDOSCOPY;  Service: Cardiovascular;  Laterality: N/A;   TEE WITHOUT CARDIOVERSION N/A 04/19/2022   Procedure: TRANSESOPHAGEAL ECHOCARDIOGRAM (TEE);  Surgeon: Loreli Slot, MD;  Location: [facility] OR;  Service: Open Heart Surgery;  Laterality: N/A;   TEE WITHOUT CARDIOVERSION N/A 07/28/2022   Procedure: TRANSESOPHAGEAL ECHOCARDIOGRAM (TEE);  Surgeon: Thurmon Fair, MD;  Location: [facility] ENDOSCOPY;  Service: Cardiovascular;  Laterality: N/A;   TRICUSPID VALVE REPLACEMENT N/A 04/19/2022   Procedure: TRICUSPID VALVE REPAIR WITH MC3 ANNULOPLASTY RING;  Surgeon: Loreli Slot, MD;  Location: [facility] OR;  Service: Open Heart Surgery;  Laterality: N/A;    Social History:  reports that she has been smoking cigarettes. She has a 8.75 pack-year smoking history. She has never used smokeless tobacco. She reports that she does not drink alcohol and does not  use drugs.   Allergies  Allergen Reactions   Norco [Hydrocodone-Acetaminophen] Itching and Other (See Comments)    Tolerates Oxycodone   Hydrocodone Itching and Other (See Comments)    Tolerates oxycodone     Family History  Problem Relation Age of Onset   Uterine cancer Mother    Alcohol abuse Father    Alcohol abuse Sister    Drug abuse Sister    Drug abuse Brother    Alcohol abuse Brother    Schizophrenia Son    Diabetes Other    Arthritis Other    Hypertension Other    Heart attack Son 15       Died suddenly      Prior to Admission medications   Medication Sig Start Date End Date Taking? Authorizing Provider  alum & mag hydroxide-simeth (MAALOX/MYLANTA) 200-200-20 MG/5ML suspension Take 30 mLs by mouth every 4 (four) hours as needed for indigestion. 05/06/23   Glade Lloyd, MD  ARIPiprazole (ABILIFY) 5 MG tablet Take 2.5 mg by mouth daily. 07/20/22   [provider]  atorvastatin (LIPITOR) 20 MG tablet Take 20 mg by mouth daily.    [provider]  Blood Glucose Monitoring Suppl DEVI 1 each by Does not apply route in the morning, at  noon, and at bedtime. May substitute to any manufacturer covered by patient's insurance. 04/09/23   Glade Lloyd, MD  buPROPion (WELLBUTRIN XL) 300 MG 24 hr tablet Take 300 mg by mouth in the morning. 03/24/22   [provider]  dexamethasone (DECADRON) 4 MG tablet Take 1 tablet (4 mg total) by mouth daily. 05/07/23   Glade Lloyd, MD  dexlansoprazole (DEXILANT) 60 MG capsule Take 60 mg by mouth daily. 07/26/22   [provider]  diclofenac Sodium (VOLTAREN) 1 % GEL Apply 2 g topically 4 (four) times daily as needed (pain). 06/29/17   [provider]  FARXIGA 10 MG TABS tablet Take 10 mg by mouth daily. 08/10/22   [provider]  furosemide (LASIX) 40 MG tablet Take 1 tablet (40 mg total) by mouth daily. 02/28/23   Chandrasekhar, [place] Dragon A, MD  gabapentin (NEURONTIN) 300 MG capsule Take 300 mg by  mouth 3 (three) times daily. 08/19/22   [provider]  Glucose Blood (BLOOD GLUCOSE TEST STRIPS) STRP 1 each by In Vitro route in the morning, at noon, and at bedtime. May substitute to any manufacturer covered by patient's insurance. 04/09/23   Glade Lloyd, MD  hydrALAZINE (APRESOLINE) 25 MG tablet Take 1 tablet (25 mg total) by mouth daily as needed (BP>150). 04/26/23   Chandrasekhar, Mahesh A, MD  insulin glargine (LANTUS) 100 UNIT/ML Solostar Pen Inject 15 Units into the skin daily. 04/09/23   Glade Lloyd, MD  Insulin Pen Needle 31G X 5 MM MISC Lantus 15 units daily 04/09/23   Glade Lloyd, MD  lidocaine-prilocaine (EMLA) cream Apply to affected area once as directed. 05/05/23   Malachy Mood, MD  linagliptin (TRADJENTA) 5 MG TABS tablet Take 5 mg by mouth daily.    [provider]  metoprolol tartrate (LOPRESSOR) 50 MG tablet Take 50 mg by mouth 2 (two) times daily.    [provider]  naloxone [facility]) nasal spray 4 mg/0.1 mL PRN for suspected opiate OD 04/24/23   Glyn Ade, MD  ondansetron (ZOFRAN) 8 MG tablet Take 1 tablet (8 mg total) by mouth every 8 (eight) hours as needed for nausea or vomiting. 05/05/23   Malachy Mood, MD  Oxycodone HCl 20 MG TABS Take 1 tablet (20 mg total) by mouth every 6 (six) hours as needed for breakthrough pain. 05/06/23   Glade Lloyd, MD  oxyCODONE ER (XTAMPZA ER) 36 MG C12A Take 1 capsule (36 mg total) by mouth every 8 (eight) hours. 05/06/23   Pickenpack-Cousar, Arty Baumgartner, NP  Oyster Shell (OYSTER CALCIUM) 500 MG TABS tablet Take 500 mg of elemental calcium by mouth daily.    [provider]  polyethylene glycol powder (GLYCOLAX/MIRALAX) 17 GM/SCOOP powder Take 17 g by mouth daily. 05/07/23   Glade Lloyd, MD  PREMARIN 0.45 MG tablet Take 0.45 mg by mouth daily. 01/11/22   [provider]  prochlorperazine (COMPAZINE) 10 MG tablet Take 1 tablet (10 mg total) by mouth every 6 (six) hours as needed for nausea or vomiting.  05/05/23   Malachy Mood, MD  QUEtiapine (SEROQUEL) 25 MG tablet Take 25 mg by mouth as needed. 03/17/23   [provider]  senna (SENOKOT) 8.6 MG TABS tablet Take 2 tablets (17.2 mg total) by mouth daily. 05/07/23   Glade Lloyd, MD  TIADYLT ER 300 MG 24 hr capsule Take 300 mg by mouth daily. 03/11/23   [provider]  tobramycin-dexamethasone Wallene Dales) ophthalmic solution 1 drop 4 (four) times daily. 04/14/23   [provider]  traZODone (DESYREL) 100 MG tablet Take 200 mg by mouth at bedtime. Pt takes 2 tablets 200 mg at bedtime.    [provider]  TRINTELLIX 10 MG TABS tablet Take 10 mg by mouth daily. 10/22/22   [provider]  warfarin (COUMADIN) 5 MG tablet TAKE 1 TABLET BY MOUTH DAILY EXCEPT 1.5 TABLETS BY MOUTH ON SUNDAYS AS DIRECTED BY COUMADIN CLINIC 05/03/23   Christell Constant, MD    Physical Exam: BP (!) 150/103   Pulse (!) 109   Temp 98 F (36.7 C)   Resp (!) 22   Ht 5\' 11"  (1.803 m)   Wt 78.5 kg   LMP 12/06/1976   SpO2 100%   BMI 24.13 kg/m   General: 62 y.o. year-old female well developed well nourished in no acute distress.  Alert and oriented x3. Cardiovascular: Regular rate and rhythm with no rubs or gallops.  No thyromegaly or JVD noted.  No lower extremity edema. 2/4 pulses in all 4 extremities. Respiratory: Clear to auscultation with no wheezes or rales. Good inspiratory effort. Abdomen: Soft nontender nondistended with normal bowel sounds x4 quadrants. Right flank tenderness. Muskuloskeletal: No cyanosis, clubbing or edema noted bilaterally Neuro: CN II-XII intact, strength, sensation, reflexes Skin: No ulcerative lesions noted or rashes Psychiatry: Judgement and insight appear normal. Mood is appropriate for condition and setting          Labs on Admission:  Basic Metabolic Panel: Recent Labs  Lab 05/05/23 0500 05/06/23 0635 05/10/23 1754 05/10/23 1818  NA 132* 131* 131* 133*  K 3.6 3.3* 4.7 4.4  CL 97*  94* 98 97*  CO2 27 28 22   --   GLUCOSE 58* 112* 288* 277*  BUN 24* 20 25* 27*  CREATININE 0.92 0.74 0.89 0.70  CALCIUM 8.5* 8.5* 8.5*  --   MG 1.6* 1.7  --   --    Liver Function Tests: Recent Labs  Lab 05/10/23 1754  AST 28  ALT 32  ALKPHOS 126  BILITOT 0.9  PROT 6.8  ALBUMIN 3.8   Recent Labs  Lab 05/10/23 1754  LIPASE 52*   No results for input(s): "AMMONIA" in the last 168 hours. CBC: Recent Labs  Lab 05/04/23 0552 05/05/23 0500 05/06/23 0635 05/10/23 1754 05/10/23 1818  WBC 5.2 8.3 6.7 7.2  --   NEUTROABS  --   --   --  4.7  --   HGB 12.2 12.1 12.3 13.0 13.3  HCT 36.2 35.9* 35.8* 38.3 39.0  MCV 91.6 90.2 90.2 91.6  --   PLT 147* 219 150 169  --    Cardiac Enzymes: No results for input(s): "CKTOTAL", "CKMB", "CKMBINDEX", "TROPONINI" in the last 168 hours.  BNP (last 3 results) Recent Labs    08/10/22 0231 08/12/22 1352 08/12/22 1829  BNP 713.2* 555.6* 497.2*    ProBNP (last 3 results) Recent Labs    08/24/22 1555 12/01/22 1629  PROBNP 2,927* 2,487*    CBG: Recent Labs  Lab 05/05/23 2141 05/06/23 0053 05/06/23 0357 05/06/23 0737 05/06/23 1149  GLUCAP 442* 368* 166* 135* 275*    Radiological Exams on Admission: CT ABDOMEN PELVIS W CONTRAST  Result Date: 05/10/2023 CLINICAL DATA:  Abdominal pain for several weeks, initial encounter EXAM: CT ABDOMEN AND PELVIS WITH CONTRAST TECHNIQUE: Multidetector CT imaging of the abdomen and pelvis was performed using the standard protocol following bolus administration of intravenous contrast. RADIATION DOSE REDUCTION: This exam was performed according to the departmental dose-optimization program which  includes automated exposure control, adjustment of the mA and/or kV according to patient size and/or use of iterative reconstruction technique. CONTRAST:  OMNIPAQUE IOHEXOL 300 MG/ML  SOLN COMPARISON:  04/24/2023 FINDINGS: Lower chest: Lung bases show no focal infiltrate or sizable effusion. A pleural  based nodule is noted which measures approximately 6 mm which is stable from the prior exam. Hepatobiliary: Multiple peripherally enhancing lesions are identified throughout the liver similar to that seen on prior CT examination consistent with metastatic disease. The gallbladder is decompressed. Pancreas: Pancreas again demonstrates a predominately hypodense mass lesion within the body and tail of the pancreas similar to that seen on prior CT examination consistent with the known pancreatic malignancy. Spleen: Normal in size without focal abnormality. Adrenals/Urinary Tract: Adrenal glands are within normal limits. Kidneys demonstrate a normal enhancement pattern bilaterally. Normal excretion is noted. No renal calculi or obstructive changes are seen. The bladder is decompressed. There is air in within the anterior aspect of the bladder wall suspicious for emphysematous cystitis. Correlate with urinalysis. These changes are new from the prior exam. Stomach/Bowel: No obstructive or inflammatory changes of the colon are seen. The appendix is not well visualized and may have been surgically removed. No inflammatory changes are seen. Small bowel and stomach are unremarkable. Vascular/Lymphatic: Atherosclerotic calcifications of the abdominal aorta are noted. No aneurysmal dilatation is seen. There is again noted encasement of the celiac axis as well as a fat plane adjacent to the superior mesenteric artery consistent with localized neoplastic invasion. 18 mm short axis node is noted in the portacaval space stable from the prior exam. Splenic vein occlusion is not again seen with some collateral flow identified. Reproductive: Status post hysterectomy. No adnexal masses. Other: No abdominal wall hernia or abnormality. No abdominopelvic ascites. Musculoskeletal: Degenerative changes of lumbar spine are seen. IMPRESSION: Changes consistent with the known history of pancreatic neoplasm stable in appearance from the recent  exam. Associated vascular encasement is noted as well as splenic vein occlusion and adjacent portacaval adenopathy. Hepatic metastatic disease is again identified and stable. Stable 6 mm subpleural nodule in the right lower lobe. This is stable dating back to January of 2022 and likely of a benign etiology. No acute abnormality is noted. Electronically Signed   By: Alcide Clever M.D.   On: 05/10/2023 19:26    EKG: I independently viewed the EKG done and my findings are as followed: Sinus tachycardia with PACs.  Rate of 106.  Nonspecific ST-T changes.  QTc 471  Assessment/Plan Present on Admission:  Complicated UTI (urinary tract infection)  Principal Problem:   Complicated UTI (urinary tract infection)  Complicated UTI, presumed right pyelonephritis, POA UA positive for pyuria, diffuse abdominal and right flank pain and tenderness with palpation. Follow urine culture for ID and sensitivities Continue Rocephin empirically, started in the ED. Monitor fever curve and WBCs Start gentle IV fluid hydration NS at 75 cc/h x 1 day. As needed analgesics and as needed bowel regimen  Type 2 diabetes with hyperglycemia Serum glucose on presentation 288. Last hemoglobin A1c 8.7 on 04/04/2023 Heart healthy carb modified diet Start insulin sliding scale.  Diabetic polyneuropathy Resume home gabapentin  GERD Resume home PPI.  Paroxysmal A-fib on Coumadin Obtain INR Coumadin managed by pharmacy Continue Coumadin  Metastatic pancreatic cancer to liver Follows with oncology Dr. Mosetta Putt and with palliative care medicine  Chronic HFpEF 60 to 65% Last 2D echo done on 08/13/2022 revealed LVEF 60 to 65% Euvolemic Closely monitor volume status while on IV  fluid Start strict I's and O's and daily weight  Hyperlipidemia Resume home Lipitor  Essential hypertension, BP is not at goal, elevated Resume home p.o. Lopressor Monitor vital signs  Hypovolemic hyponatremia Serum sodium 133 Started NS at 75  cc/h x 1 day. Repeat BMP in the morning.  Chronic anxiety/depression Resume home regimen  Goals of care Followed by palliative care medicine Currently full code      DVT prophylaxis: Subcu Lovenox daily  Code Status: Full code  Family Communication: None at bedside  Disposition Plan: Admitted to telemetry unit  Consults called: None.  Admission status: Inpatient status.   Status is: Inpatient The patient requires at least 2 midnights for further evaluation and treatment of present condition.   Darlin Drop MD [facility] Pager [phone]  If 7PM-7AM, please contact night-coverage [email or web address] Password [facility]  05/10/2023, 8:37 PM

## 2023-05-10 NOTE — ED Notes (Signed)
Attempt to access port, unable to access due to pt pain and swelling around site from recent port placement. Will start peripheral IV

## 2023-05-10 NOTE — ED Triage Notes (Addendum)
Pt BIB EMS from home c/o abdominal pain x few weeks, poor appetite and dizziness. Denies nausea or vomiting. Cbg 371 124/90 64 HR 97% room air 20 resp

## 2023-05-11 ENCOUNTER — Encounter: Payer: 59 | Admitting: Nurse Practitioner

## 2023-05-11 ENCOUNTER — Other Ambulatory Visit: Payer: 59

## 2023-05-11 ENCOUNTER — Ambulatory Visit: Payer: 59 | Admitting: Hematology

## 2023-05-11 DIAGNOSIS — G893 Neoplasm related pain (acute) (chronic): Secondary | ICD-10-CM | POA: Diagnosis not present

## 2023-05-11 DIAGNOSIS — Z66 Do not resuscitate: Secondary | ICD-10-CM | POA: Diagnosis not present

## 2023-05-11 DIAGNOSIS — Z7189 Other specified counseling: Secondary | ICD-10-CM

## 2023-05-11 DIAGNOSIS — N3000 Acute cystitis without hematuria: Secondary | ICD-10-CM | POA: Diagnosis not present

## 2023-05-11 DIAGNOSIS — Z515 Encounter for palliative care: Secondary | ICD-10-CM

## 2023-05-11 DIAGNOSIS — Z79899 Other long term (current) drug therapy: Secondary | ICD-10-CM

## 2023-05-11 DIAGNOSIS — C259 Malignant neoplasm of pancreas, unspecified: Secondary | ICD-10-CM

## 2023-05-11 DIAGNOSIS — N12 Tubulo-interstitial nephritis, not specified as acute or chronic: Secondary | ICD-10-CM

## 2023-05-11 DIAGNOSIS — N390 Urinary tract infection, site not specified: Secondary | ICD-10-CM | POA: Diagnosis not present

## 2023-05-11 LAB — COMPREHENSIVE METABOLIC PANEL
ALT: 29 U/L (ref 0–44)
AST: 25 U/L (ref 15–41)
Albumin: 3.3 g/dL — ABNORMAL LOW (ref 3.5–5.0)
Alkaline Phosphatase: 108 U/L (ref 38–126)
Anion gap: 9 (ref 5–15)
BUN: 17 mg/dL (ref 8–23)
CO2: 21 mmol/L — ABNORMAL LOW (ref 22–32)
Calcium: 7.9 mg/dL — ABNORMAL LOW (ref 8.9–10.3)
Chloride: 101 mmol/L (ref 98–111)
Creatinine, Ser: 0.64 mg/dL (ref 0.44–1.00)
GFR, Estimated: >60 mL/min (ref >60)
Glucose, Bld: 201 mg/dL — ABNORMAL HIGH (ref 70–99)
Potassium: 3.8 mmol/L (ref 3.5–5.1)
Sodium: 131 mmol/L — ABNORMAL LOW (ref 135–145)
Total Bilirubin: 0.8 mg/dL (ref 0.3–1.2)
Total Protein: 6 g/dL — ABNORMAL LOW (ref 6.5–8.1)

## 2023-05-11 LAB — CULTURE, BLOOD (ROUTINE X 2)

## 2023-05-11 LAB — GLUCOSE, CAPILLARY
Glucose-Capillary: 220 mg/dL — ABNORMAL HIGH (ref 70–99)
Glucose-Capillary: 185 mg/dL — ABNORMAL HIGH (ref 70–99)
Glucose-Capillary: 146 mg/dL — ABNORMAL HIGH (ref 70–99)
Glucose-Capillary: 229 mg/dL — ABNORMAL HIGH (ref 70–99)
Glucose-Capillary: 186 mg/dL — ABNORMAL HIGH (ref 70–99)

## 2023-05-11 LAB — URINE CULTURE

## 2023-05-11 LAB — CBC
HCT: 33.9 % — ABNORMAL LOW (ref 36.0–46.0)
Hemoglobin: 11.3 g/dL — ABNORMAL LOW (ref 12.0–15.0)
MCH: 30.7 pg (ref 26.0–34.0)
MCHC: 33.3 g/dL (ref 30.0–36.0)
MCV: 92.1 fL (ref 80.0–100.0)
Platelets: 141 10*3/uL — ABNORMAL LOW (ref 150–400)
RBC: 3.68 MIL/uL — ABNORMAL LOW (ref 3.87–5.11)
RDW: 13.7 % (ref 11.5–15.5)
WBC: 6.6 10*3/uL (ref 4.0–10.5)
nRBC: 0 % (ref 0.0–0.2)

## 2023-05-11 LAB — PHOSPHORUS: Phosphorus: 2.7 mg/dL (ref 2.5–4.6)

## 2023-05-11 LAB — MAGNESIUM: Magnesium: 1.6 mg/dL — ABNORMAL LOW (ref 1.7–2.4)

## 2023-05-11 MED ORDER — SODIUM CHLORIDE 0.9% FLUSH
10.0000 mL | Freq: Two times a day (BID) | INTRAVENOUS | Status: DC
  Administered 2023-05-12: 10 mL

## 2023-05-11 MED ORDER — CHLORHEXIDINE GLUCONATE CLOTH 2 % EX PADS
6.0000 | MEDICATED_PAD | Freq: Every day | CUTANEOUS | Status: DC
  Administered 2023-05-11 – 2023-05-13 (×3): 6 via TOPICAL

## 2023-05-11 MED ORDER — WARFARIN SODIUM 2.5 MG PO TABS: 2.5000 mg | ORAL_TABLET | ORAL | Status: DC

## 2023-05-11 MED ORDER — OXYCODONE HCL 5 MG PO TABS
20.0000 mg | ORAL_TABLET | ORAL | Status: DC | PRN
  Administered 2023-05-11 – 2023-05-13 (×7): 20 mg via ORAL
  Filled 2023-05-11 (×7): qty 4

## 2023-05-11 MED ORDER — SODIUM CHLORIDE 0.9% FLUSH
10.0000 mL | INTRAVENOUS | Status: DC | PRN
  Administered 2023-05-13: 10 mL

## 2023-05-11 MED ORDER — HYDROMORPHONE HCL 1 MG/ML IJ SOLN
0.5000 mg | INTRAMUSCULAR | Status: DC | PRN
  Administered 2023-05-11 – 2023-05-13 (×13): 0.5 mg via INTRAVENOUS
  Filled 2023-05-11 (×14): qty 0.5

## 2023-05-11 MED ORDER — OXYCODONE HCL 5 MG PO TABS
20.0000 mg | ORAL_TABLET | ORAL | Status: DC | PRN
  Administered 2023-05-11: 20 mg via ORAL
  Filled 2023-05-11: qty 4

## 2023-05-11 MED ORDER — OXYCODONE HCL ER 20 MG PO T12A
40.0000 mg | EXTENDED_RELEASE_TABLET | Freq: Three times a day (TID) | ORAL | Status: DC
  Administered 2023-05-11 – 2023-05-13 (×5): 40 mg via ORAL
  Filled 2023-05-11 (×5): qty 2

## 2023-05-11 MED ORDER — ENSURE ENLIVE PO LIQD: 237.0000 mL | Freq: Two times a day (BID) | ORAL | Status: DC

## 2023-05-11 MED ORDER — WARFARIN SODIUM 5 MG PO TABS
5.0000 mg | ORAL_TABLET | Freq: Once | ORAL | Status: AC
  Administered 2023-05-11: 5 mg via ORAL
  Filled 2023-05-11: qty 1

## 2023-05-11 MED ORDER — ORAL CARE MOUTH RINSE: 15.0000 mL | OROMUCOSAL | Status: DC | PRN

## 2023-05-11 MED ORDER — WARFARIN - PHARMACIST DOSING INPATIENT: Freq: Every day | Status: DC

## 2023-05-11 MED ORDER — SENNA 8.6 MG PO TABS
2.0000 | ORAL_TABLET | Freq: Two times a day (BID) | ORAL | Status: DC
  Administered 2023-05-11 – 2023-05-13 (×3): 17.2 mg via ORAL
  Filled 2023-05-11 (×4): qty 2

## 2023-05-11 MED ORDER — POLYETHYLENE GLYCOL 3350 17 G PO PACK
17.0000 g | PACK | Freq: Every day | ORAL | Status: DC
  Administered 2023-05-12 – 2023-05-13 (×2): 17 g via ORAL
  Filled 2023-05-11 (×2): qty 1

## 2023-05-11 NOTE — Progress Notes (Signed)
ANTICOAGULATION CONSULT NOTE - Initial Consult  Pharmacy Consult for Warfarin Indication: atrial fibrillation  Allergies  Allergen Reactions   Norco [Hydrocodone-Acetaminophen] Itching and Other (See Comments)    Tolerates Oxycodone    Patient Measurements: Height: 5\' 11"  (180.3 cm) Weight: 70.5 kg (155 lb 8 oz) IBW/kg (Calculated) : 70.8  Vital Signs: Temp: 98.1 F (36.7 C) (06/05 0642) Temp Source: Oral (06/05 0642) BP: 178/111 (06/05 0642) Pulse Rate: 52 (06/05 0642)  Labs: Recent Labs    05/10/23 1754 05/10/23 1811 05/10/23 1818 05/11/23 0445  HGB 13.0  --  13.3 11.3*  HCT 38.3  --  39.0 33.9*  PLT 169  --   --  141*  LABPROT  --  13.5  --   --   INR  --  1.0  --   --   CREATININE 0.89  --  0.70 0.64    Estimated Creatinine Clearance: 80.1 mL/min (by C-G formula based on SCr of 0.64 mg/dL).   Medical History: Past Medical History:  Diagnosis Date   Acute pericardial effusion 05/17/2022   AKI (acute kidney injury) (HCC) 05/17/2022   Anxiety    Arthritis    Cervicalgia    CHF (congestive heart failure) (HCC)    Chondromalacia of patella    Chronic neck pain    Chronic pain syndrome    Depression    secondary to loss of her son at age 45   Diabetic neuropathy (HCC)    DMII (diabetes mellitus, type 2) (HCC)    Dysrhythmia    Enthesopathy of hip region    Fibromyalgia    GERD (gastroesophageal reflux disease)    Hep C w/o coma, chronic (HCC)    Hepatitis C    diagnosed 2005   Hypertension    Insomnia    Low back pain    Lumbago    Mitral regurgitation    Obesity    Pain in joint, upper arm    Primary localized osteoarthrosis, lower leg    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   Substance abuse (HCC)    sober since 2001   Tobacco abuse    Tricuspid regurgitation    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 She states  she knows she did not take Warfarin on 6/4.  She does not when she last took it.  Assessment:  [name] is a 62 y.o. female with newly diagnosed metastasized pancreatic adenocarcinoma, recently admitted on 04/26/2023 and discharged on 05/06/2023 for intractable abdominal pain.  On 6/4 she presented to [facility] ED from home with complaints of diffuse abdominal pain and right flank pain. She is admitted with complicated UTI, right pyelonephritis. Of note during last admission warfarin was reversed on 5/25 for liver lesion biopsy and port placement on 5/28.   Goal of Therapy:  INR 2-3 Monitor platelets by anticoagulation protocol: Yes   Baseline Labs, 05/10/23 - INR= 1, subtherapeutic - Hgb= 13 - Plt=169  Today, 05/11/23  - Hgb=11.3, low - Plt= 141, low  Plan:  - 5mg  x 1 dose today. - Daily PT/INR and CBC - Continue to monitor H&H and platelets - Monitor signs/symptoms of bleeding  Jonh Mcqueary [name] 05/11/2023,9:09 AM

## 2023-05-11 NOTE — Progress Notes (Signed)
[name]   DOB:September 25, 1961   WU#:[id number]   GNF#:[id number]  Med/onc follow up   Subjective: Patient was discharged home on 05/06/2023, but came back yesterday for worsening pain.  Apparently she did not pick up the pain medication which was prescribed by palliative care nurse petitioner [facility] on her discharge.  She has very low appetite, has not been eating much at home.  She lives alone, she has difficult time to take care of herself at home.  Her sister is in her room when I saw her.  Objective:  Vitals:   05/11/23 0642 05/11/23 1039  BP: (!) 178/111 (!) 129/103  Pulse: (!) 52 70  Resp: 16 17  Temp: 98.1 F (36.7 C) 98.1 F (36.7 C)  SpO2: 100% 100%    Body mass index is 21.69 kg/m.  Intake/Output Summary (Last 24 hours) at 05/11/2023 1720 Last data filed at 05/11/2023 1500 Gross per 24 hour  Intake 1929.79 ml  Output --  Net 1929.79 ml     Sclerae unicteric  Oropharynx clear  No peripheral adenopathy  Lungs clear -- no rales or rhonchi  Heart regular rate and rhythm  Abdomen soft, with tenderness in the mid and upper abdomen  MSK no focal spinal tenderness, no peripheral edema  Neuro nonfocal    CBG (last 3)  Recent Labs    05/11/23 0731 05/11/23 1202 05/11/23 1629  GLUCAP 185* 146* 229*     Labs:   Urine Studies No results for input(s): "UHGB", "CRYS" in the last 72 hours.  Invalid input(s): "UACOL", "UAPR", "USPG", "UPH", "UTP", "UGL", "UKET", "UBIL", "UNIT", "UROB", "ULEU", "UEPI", "UWBC", "URBC", "UBAC", "CAST", "UCOM", "BILUA"  Basic Metabolic Panel: Recent Labs  Lab 05/05/23 0500 05/06/23 0635 05/10/23 1754 05/10/23 1818 05/11/23 0445  NA 132* 131* 131* 133* 131*  K 3.6 3.3* 4.7 4.4 3.8  CL 97* 94* 98 97* 101  CO2 27 28 22   --  21*  GLUCOSE 58* 112* 288* 277* 201*  BUN 24* 20 25* 27* 17  CREATININE 0.92 0.74 0.89 0.70 0.64  CALCIUM 8.5* 8.5* 8.5*  --  7.9*  MG 1.6* 1.7  --   --  1.6*  PHOS  --   --   --   --  2.7   GFR Estimated  Creatinine Clearance: 80.1 mL/min (by C-G formula based on SCr of 0.64 mg/dL). Liver Function Tests: Recent Labs  Lab 05/10/23 1754 05/11/23 0445  AST 28 25  ALT 32 29  ALKPHOS 126 108  BILITOT 0.9 0.8  PROT 6.8 6.0*  ALBUMIN 3.8 3.3*   Recent Labs  Lab 05/10/23 1754  LIPASE 52*    No results for input(s): "AMMONIA" in the last 168 hours. Coagulation profile Recent Labs  Lab 05/05/23 0500 05/06/23 0635 05/10/23 1811  INR 1.5* 1.5* 1.0    CBC: Recent Labs  Lab 05/05/23 0500 05/06/23 0635 05/10/23 1754 05/10/23 1818 05/11/23 0445  WBC 8.3 6.7 7.2  --  6.6  NEUTROABS  --   --  4.7  --   --   HGB 12.1 12.3 13.0 13.3 11.3*  HCT 35.9* 35.8* 38.3 39.0 33.9*  MCV 90.2 90.2 91.6  --  92.1  PLT 219 150 169  --  141*   Cardiac Enzymes: No results for input(s): "CKTOTAL", "CKMB", "CKMBINDEX", "TROPONINI" in the last 168 hours. BNP: Invalid input(s): "POCBNP" CBG: Recent Labs  Lab 05/06/23 1149 05/10/23 2215 05/11/23 0731 05/11/23 1202 05/11/23 1629  GLUCAP 275* 220* 185* 146* 229*  D-Dimer No results for input(s): "DDIMER" in the last 72 hours. Hgb A1c No results for input(s): "HGBA1C" in the last 72 hours. Lipid Profile No results for input(s): "CHOL", "HDL", "LDLCALC", "TRIG", "CHOLHDL", "LDLDIRECT" in the last 72 hours. Thyroid function studies No results for input(s): "TSH", "T4TOTAL", "T3FREE", "THYROIDAB" in the last 72 hours.  Invalid input(s): "FREET3" Anemia work up No results for input(s): "VITAMINB12", "FOLATE", "FERRITIN", "TIBC", "IRON", "RETICCTPCT" in the last 72 hours. Microbiology Recent Results (from the past 240 hour(s))  Blood culture (routine x 2)     Status: None (Preliminary result)   Collection Time: 05/10/23  9:10 PM   Specimen: BLOOD  Result Value Ref Range Status   Specimen Description   Final    BLOOD BLOOD RIGHT ARM Performed at [facility], [address], [place], [place] [address]    Special  Requests   Final    BOTTLES DRAWN AEROBIC AND ANAEROBIC Blood Culture results may not be optimal due to an inadequate volume of blood received in culture bottles Performed at [facility], [address], [place], [place] [address]    Culture   Final    NO GROWTH < 12 HOURS Performed at  [facility], [address], [place], [place] [address]    Report Status PENDING  Incomplete  Blood culture (routine x 2)     Status: None (Preliminary result)   Collection Time: 05/11/23  4:46 AM   Specimen: BLOOD  Result Value Ref Range Status   Specimen Description   Final    BLOOD BLOOD RIGHT ARM AEROBIC BOTTLE ONLY Performed at [facility], [address] [place], [place] [address]    Special Requests   Final    BOTTLES DRAWN AEROBIC ONLY Blood Culture adequate volume Performed at [facility], [address], [place], [place] [address]    Culture   Final    NO GROWTH <12 HOURS Performed at [facility], [address], [place], [place] [address]    Report Status PENDING  Incomplete      Studies:  CT ABDOMEN PELVIS W CONTRAST  Result Date: 05/10/2023 CLINICAL DATA:  Abdominal pain for several weeks, initial encounter EXAM: CT ABDOMEN AND PELVIS WITH CONTRAST TECHNIQUE: Multidetector CT imaging of the abdomen and pelvis was performed using the standard protocol following bolus administration of intravenous contrast. RADIATION DOSE REDUCTION: This exam was performed according to the departmental dose-optimization program which includes automated exposure control, adjustment of the mA and/or kV according to patient size and/or use of iterative reconstruction technique. CONTRAST:  OMNIPAQUE IOHEXOL 300 MG/ML  SOLN COMPARISON:  04/24/2023 FINDINGS: Lower chest: Lung bases show no focal infiltrate or sizable effusion. A pleural based nodule is noted which measures approximately 6 mm which is stable from the prior exam.  Hepatobiliary: Multiple peripherally enhancing lesions are identified throughout the liver similar to that seen on prior CT examination consistent with metastatic disease. The gallbladder is decompressed. Pancreas: Pancreas again demonstrates a predominately hypodense mass lesion within the body and tail of the pancreas similar to that seen on prior CT examination consistent with the known pancreatic malignancy. Spleen: Normal in size without focal abnormality. Adrenals/Urinary Tract: Adrenal glands are within normal limits. Kidneys demonstrate a normal enhancement pattern bilaterally. Normal excretion is noted. No renal calculi or obstructive changes are seen. The bladder is decompressed. There is air in within the anterior aspect of the bladder wall suspicious for emphysematous cystitis.  Correlate with urinalysis. These changes are new from the prior exam. Stomach/Bowel: No obstructive or inflammatory changes of the colon are seen. The appendix is not well visualized and may have been surgically removed. No inflammatory changes are seen. Small bowel and stomach are unremarkable. Vascular/Lymphatic: Atherosclerotic calcifications of the abdominal aorta are noted. No aneurysmal dilatation is seen. There is again noted encasement of the celiac axis as well as a fat plane adjacent to the superior mesenteric artery consistent with localized neoplastic invasion. 18 mm short axis node is noted in the portacaval space stable from the prior exam. Splenic vein occlusion is not again seen with some collateral flow identified. Reproductive: Status post hysterectomy. No adnexal masses. Other: No abdominal wall hernia or abnormality. No abdominopelvic ascites. Musculoskeletal: Degenerative changes of lumbar spine are seen. IMPRESSION: Changes consistent with the known history of pancreatic neoplasm stable in appearance from the recent exam. Associated vascular encasement is noted as well as splenic vein occlusion and adjacent  portacaval adenopathy. Hepatic metastatic disease is again identified and stable. Stable 6 mm subpleural nodule in the right lower lobe. This is stable dating back to January of 2022 and likely of a benign etiology. No acute abnormality is noted. Electronically Signed   By: Alcide Clever M.D.   On: 05/10/2023 19:26    Assessment: 62 y.o. female   metastatic pancreatic cancer to liver Worsening abdominal pain, secondary to #1, s/p celiac nerve block Moderate protein and calorie malnutrition, anorexia Type 2 diabetes with neuropathy Atrial fibrillation, CHF CKD stage 3a COPD Anxiety and depression DNR     Plan:  -Due to her persistent abdominal pain, anorexia, and very limited social support, her failure to thrive at home, and a very limited social support, she is not a candidate for chemotherapy.  I explained this to patient and her sister, she is receptive. -Will focus on her symptom management and disposition.  Due to her very limited social support at home, she is not able to live by herself. She probably need nursing home replacement. -We discussed hospice care, she is open to that.  Hopefully she can go to nursing home with hospice service on board. -I do not think she is eligible for residential hospice at this point.  I anticipate his life expectancy is probably a few months.  -I will f/u as needed.    Malachy Mood, MD 05/11/2023  5:20 PM

## 2023-05-11 NOTE — Progress Notes (Signed)
Assumed care of patient at 1500, agree with previously documented assessment. Will continue current plan of care.

## 2023-05-11 NOTE — Assessment & Plan Note (Deleted)
-  diagnosed on

## 2023-05-11 NOTE — TOC Initial Note (Signed)
Transition of Care [facility]) - Initial/Assessment Note    Patient Details  Name: [name] MRN: [id number] Date of Birth: October 09, 1961  Transition of Care [facility]) CM/SW Contact:    Howell Rucks, RN Phone Number: 05/11/2023, 11:34 AM  Clinical Narrative:    Met with pt at bedside to introduce role of TOC/NCM and review for dc needs, pt reports she recalls meeting with palliative care but no discussion of Hospice. NCM informed Oncologist will see pt this afternoon, pt tearful.NCM spoke with pt's sister Alvira Philips) on phone per pt's request, informed pt spoke with Palliative team this morning and dc options were discussed, pt to see Oncologist this afternoon. Alvira Philips reports pt lives alone and is now unable to care for herself.  Alvira Philips reports she has her own health issues and is unable to assist in care of the pt. TOC will continue to follow.                  Barriers to Discharge: Continued Medical Work up   Patient Goals and CMS Choice            Expected Discharge Plan and Services   Discharge Planning Services: CM Consult   Living arrangements for the past 2 months: Apartment                                      Prior Living Arrangements/Services Living arrangements for the past 2 months: Apartment Lives with:: Self Patient language and need for interpreter reviewed:: Yes Do you feel safe going back to the place where you live?: No   per pt and sister Alvira Philips) , pt lives alone, unable to care for self  Need for Family Participation in Patient Care: Yes (Comment) Care giver support system in place?: Yes (comment)   Criminal Activity/Legal Involvement Pertinent to Current Situation/Hospitalization: No - Comment as needed  Activities of Daily Living Home Assistive Devices/Equipment: Eyeglasses, Cane (specify quad or straight) ADL Screening (condition at time of admission) Patient's cognitive ability adequate to safely complete daily activities?: Yes Is  the patient deaf or have difficulty hearing?: No Does the patient have difficulty seeing, even when wearing glasses/contacts?: No Does the patient have difficulty concentrating, remembering, or making decisions?: No Patient able to express need for assistance with ADLs?: Yes Does the patient have difficulty dressing or bathing?: Yes Independently performs ADLs?: Yes (appropriate for developmental age) Does the patient have difficulty walking or climbing stairs?: Yes Weakness of Legs: Both Weakness of Arms/Hands: None  Permission Sought/Granted Permission sought to share information with : Case Manager Permission granted to share information with : Yes, Verbal Permission Granted  Share Information with NAME: Fannie Knee, RN           Emotional Assessment Appearance:: Appears stated age Attitude/Demeanor/Rapport: Crying Affect (typically observed): Tearful/Crying Orientation: : Oriented to Self, Oriented to Place, Oriented to  Time, Oriented to Situation Alcohol / Substance Use: Not Applicable Psych Involvement: No (comment)  Admission diagnosis:  Pyelonephritis [N12] Complicated UTI (urinary tract infection) [N39.0] Patient Active Problem List   Diagnosis Date Noted   Complicated UTI (urinary tract infection) 05/10/2023   Goals of care, counseling/discussion 05/02/2023   Itching 05/01/2023   Counseling and coordination of care 05/01/2023   Medication management 04/30/2023   Pain of upper abdomen 04/28/2023   High risk medication use 04/28/2023   Need for emotional support 04/28/2023   Constipation 04/28/2023  Pancreatic cancer metastasized to liver [facility]) 04/28/2023   Palliative care encounter 04/28/2023   Essential hypertension 04/27/2023   Thrombocytopenia (HCC) 04/27/2023   Intractable pain 04/26/2023   Pancreatic cancer (HCC) 04/12/2023   Pancreatic mass 04/04/2023   Mass of pancreas 04/03/2023   Chronic kidney disease, stage 3a (HCC) 04/03/2023   Pulmonary  nodules 04/03/2023   Mixed hyperlipidemia 02/28/2023   Rheumatic disease of heart valve 11/19/2022   COPD (chronic obstructive pulmonary disease) (HCC) 08/05/2022   Anxiety and depression 08/05/2022   QT prolongation 08/05/2022   Benign neoplasm of ascending colon    AVM (arteriovenous malformation) of colon without hemorrhage    Benign neoplasm of cecum    Benign neoplasm of transverse colon    Normocytic anemia 07/23/2022   Pain due to onychomycosis of toenails of both feet 06/23/2022   Encounter for therapeutic drug monitoring 06/11/2022   Hypomagnesemia 05/24/2022   Long term current use of anticoagulant therapy 05/17/2022   S/P MVR/TVR  04/19/22 05/17/2022   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 05/17/2022   PAF (paroxysmal atrial fibrillation) (HCC)    Pain of left hand 01/01/2021   Nasal vestibulitis 04/25/2020   FH: CHF (congestive heart failure) 04/05/2020   Acquired hallux interphalangeus 02/26/2020   Hallux rigidus 02/26/2020   Retained orthopedic hardware 02/26/2020   Mechanical complication associated with orthopedic device (HCC) 08/31/2019   Hypercholesterolemia 08/17/2019   Lateral epicondylitis of right elbow 03/31/2018   Arthritis of knee 03/09/2018   Arthritis of foot, degenerative 02/28/2018   Hallux valgus (acquired), left foot 02/28/2018   Long term current use of oral hypoglycemic drug 01/16/2018   Pharyngeal dysphagia 12/21/2017   Left thyroid nodule 12/21/2017   History of colon polyps 09/21/2017   History of atrial fibrillation 08/23/2017   GERD (gastroesophageal reflux disease) 08/23/2017   Presbyopia of both eyes 07/15/2017   Nuclear sclerotic cataract of both eyes 07/15/2017   Dry eye syndrome of both lacrimal glands 07/15/2017   Acquired trigger finger of left ring finger 05/04/2017   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 01/17/2017   Paroxysmal atrial fibrillation (HCC) 12/30/2016   Osteoarthritis 12/30/2016   NSAID long-term use 12/30/2016    DDD (degenerative disc disease), lumbar 11/26/2015   Facet syndrome, lumbar 11/26/2015   Greater trochanteric bursitis 11/26/2015   Diabetic neuropathy (HCC) 11/26/2015   Insomnia 11/11/2015   PTSD (post-traumatic stress disorder) 11/11/2015   GAD (generalized anxiety disorder) 11/11/2015   Severe episode of recurrent major depressive disorder, without psychotic features (HCC) 11/11/2015   Tobacco use 06/27/2012   Sacroiliac joint dysfunction 05/18/2012   Right low back pain 04/19/2012   Cervical neck pain with evidence of disc disease 02/11/2012   Trochanteric bursitis of right hip 02/11/2012   Hematochezia 04/29/2011   Plantar fasciitis 03/11/2011   Acute cystitis 06/19/2010   INSOMNIA 01/18/2008   Chronic pain syndrome 12/15/2007   DIABETIC PERIPHERAL NEUROPATHY 05/08/2007   HEPATITIS C 02/27/2007   Type 2 diabetes mellitus with hyperlipidemia (HCC) 02/27/2007   Hypertension associated with diabetes (HCC) 02/27/2007   PCP:  Chrys Racer, MD Pharmacy:   [facility] [facility] [address] - [place], [place] - [address] [facility] [address] [facility] [place] [address] Phone: [phone] Fax: [phone]  [facility] - [place], [place] - [address] [place] [address] Phone: [phone] Fax: [phone]  [id number] - [place], [place] - [address] [place] [address] Phone: [phone] Fax: [phone]  Social Determinants of Health (SDOH) Social History: SDOH Screenings   Food Insecurity: No Food Insecurity (04/27/2023)  Housing: Low Risk  (04/27/2023)  Transportation Needs: No Transportation Needs (04/27/2023)  Utilities: Not At Risk (04/27/2023)  Recent Concern: Utilities - At Risk (04/04/2023)  Tobacco Use: High Risk (05/10/2023)   SDOH Interventions:     Readmission Risk Interventions    05/11/2023   11:31 AM 04/08/2023    2:15 PM 08/06/2022    2:13 PM  Readmission Risk  Prevention Plan  Transportation Screening Complete Complete Complete  Medication Review [employer]) Complete  Complete  PCP or Specialist appointment within 3-5 days of discharge Complete  Complete  HRI or Home Care Consult Complete Complete Complete  SW Recovery Care/Counseling Consult Complete Complete Complete  Palliative Care Screening Complete Not Applicable Not Applicable  Skilled Nursing Facility Not Applicable Not Applicable Not Applicable

## 2023-05-11 NOTE — Progress Notes (Signed)
Progress Note    [name]   ZOX:[id number]  DOB: 1960/12/31  DOA: 05/10/2023     1 PCP: Chrys Racer, MD  Initial CC: uncontrolled pain  Hospital Course: [name] is a 62 yo female with PMH stage IV pancreatic adenocarcinoma (diagnosed May 2024), rheumatic heart disease s/p repair May 2023, pericardial effusion/tamponade s/p pericardiocentesis June 2023, PAF on Coumadin, CKD 3A, COPD, DM II, HTN who presented back to the ER with diffuse abdominal pain and right flank pain.  She also has been recently hospitalized 5/21 - 5/31 for similar uncontrolled pain.  She underwent celiac plexus nerve block and was discharged on MS Contin and oxycodone however pain regimen was not similar to inpatient regimen.   She was readmitted for further pain control and to rule out underlying UTI given right flank pain.  UA was notable for large LE, negative nitrite, greater than 50 WBC, many bacteria and she was therefore started on antibiotics on admission as well. Oncology and palliative care were consulted as well.  Interval History:  Resting in bed when seen this morning.  Pain under better control this morning but further adjustments in regimen deferred to palliative care. She is overwhelmed with conversations this morning discussing transitioning to hospice/comfort care.  She does agree that she cannot take care of herself at home anymore but seems to be against going to a nursing home.  She is more amenable for considering a hospice house.  Assessment and Plan:  Acute cystitis UA positive for pyuria, diffuse abdominal and right flank pain and tenderness with palpation. Follow urine culture for ID and sensitivities Continue Rocephin empirically, started in the ED.   Type 2 diabetes with hyperglycemia Last hemoglobin A1c 8.7 on 04/04/2023 Heart healthy carb modified diet Start insulin sliding scale.   Diabetic polyneuropathy Resume home gabapentin   GERD Resume home PPI.    Paroxysmal A-fib on Coumadin Obtain INR Coumadin managed by pharmacy Continue home Coumadin   Metastatic pancreatic cancer to liver Follows with oncology Dr. Mosetta Putt and with palliative care medicine Palliative care medicine consulted to assist with pain control and GOC given readmission - patient elected for DNR/DNI after talking to palliative care today    Chronic HFpEF 60 to 65% Last 2D echo done on 08/13/2022 revealed LVEF 60 to 65% - no s/s exac   Hyperlipidemia -DC statin given no further mortality benefit   Essential hypertension - continue lopressor   Hypovolemic hyponatremia -Due to poor intake - Continue encouraging diet   Chronic anxiety/depression Resume home regimen   Goals of care -Follow-up GOC discussions with palliative care   Old records reviewed in assessment of this patient  Antimicrobials: Rocephin 6/4 >> current   DVT prophylaxis:   warfarin (COUMADIN) tablet 5 mg   Code Status:   Code Status: DNR  Mobility Assessment (last 72 hours)     Mobility Assessment     Row Name 05/11/23 0740 05/10/23 2234         Does patient have an order for bedrest or is patient medically unstable No - Continue assessment No - Continue assessment      What is the highest level of mobility based on the progressive mobility assessment? Level 5 (Walks with assist in room/hall) - Balance while stepping forward/back and can walk in room with assist - Complete Level 5 (Walks with assist in room/hall) - Balance while stepping forward/back and can walk in room with assist - Complete  Barriers to discharge: none Disposition Plan:  Possibly residential hospice Status is: Inpt  Objective: Blood pressure (!) 129/103, pulse 70, temperature 98.1 F (36.7 C), temperature source Oral, resp. rate 17, height 5\' 11"  (1.803 m), weight 70.5 kg, last menstrual period 12/06/1976, SpO2 100 %.  Examination:  Physical Exam Constitutional:      Comments: Frail  adult woman laying in bed mildly uncomfortable but NAD  HENT:     Head: Normocephalic and atraumatic.     Mouth/Throat:     Mouth: Mucous membranes are moist.  Eyes:     Extraocular Movements: Extraocular movements intact.  Cardiovascular:     Rate and Rhythm: Normal rate.  Pulmonary:     Effort: Pulmonary effort is normal. No respiratory distress.     Breath sounds: Normal breath sounds. No wheezing.  Abdominal:     General: Bowel sounds are normal. There is no distension.     Palpations: Abdomen is soft.     Tenderness: There is abdominal tenderness. There is no right CVA tenderness or left CVA tenderness.  Musculoskeletal:        General: Normal range of motion.     Cervical back: Normal range of motion and neck supple.  Skin:    General: Skin is warm and dry.  Neurological:     General: No focal deficit present.      Consultants:  Palliative care Oncology   Procedures:    Data Reviewed: Results for orders placed or performed during the hospital encounter of 05/10/23 (from the past 24 hour(s))  Urinalysis, Routine w reflex microscopic -Urine, Clean Catch     Status: Abnormal   Collection Time: 05/10/23  5:25 PM  Result Value Ref Range   Color, Urine YELLOW YELLOW   APPearance CLOUDY (A) CLEAR   Specific Gravity, Urine 1.035 (H) 1.005 - 1.030   pH 5.0 5.0 - 8.0   Glucose, UA >=500 (A) NEGATIVE mg/dL   Hgb urine dipstick NEGATIVE NEGATIVE   Bilirubin Urine NEGATIVE NEGATIVE   Ketones, ur NEGATIVE NEGATIVE mg/dL   Protein, ur 30 (A) NEGATIVE mg/dL   Nitrite NEGATIVE NEGATIVE   Leukocytes,Ua LARGE (A) NEGATIVE   RBC / HPF 0-5 0 - 5 RBC/hpf   WBC, UA >50 0 - 5 WBC/hpf   Bacteria, UA MANY (A) NONE SEEN   Squamous Epithelial / HPF 0-5 0 - 5 /HPF   WBC Clumps PRESENT    Mucus PRESENT   Comprehensive metabolic panel     Status: Abnormal   Collection Time: 05/10/23  5:54 PM  Result Value Ref Range   Sodium 131 (L) 135 - 145 mmol/L   Potassium 4.7 3.5 - 5.1 mmol/L    Chloride 98 98 - 111 mmol/L   CO2 22 22 - 32 mmol/L   Glucose, Bld 288 (H) 70 - 99 mg/dL   BUN 25 (H) 8 - 23 mg/dL   Creatinine, Ser 1.61 0.44 - 1.00 mg/dL   Calcium 8.5 (L) 8.9 - 10.3 mg/dL   Total Protein 6.8 6.5 - 8.1 g/dL   Albumin 3.8 3.5 - 5.0 g/dL   AST 28 15 - 41 U/L   ALT 32 0 - 44 U/L   Alkaline Phosphatase 126 38 - 126 U/L   Total Bilirubin 0.9 0.3 - 1.2 mg/dL   GFR, Estimated >09 >60 mL/min   Anion gap 11 5 - 15  CBC with Differential     Status: None   Collection Time: 05/10/23  5:54 PM  Result  Value Ref Range   WBC 7.2 4.0 - 10.5 K/uL   RBC 4.18 3.87 - 5.11 MIL/uL   Hemoglobin 13.0 12.0 - 15.0 g/dL   HCT 91.4 78.2 - 95.6 %   MCV 91.6 80.0 - 100.0 fL   MCH 31.1 26.0 - 34.0 pg   MCHC 33.9 30.0 - 36.0 g/dL   RDW 21.3 08.6 - 57.8 %   Platelets 169 150 - 400 K/uL   nRBC 0.0 0.0 - 0.2 %   Neutrophils Relative % 66 %   Neutro Abs 4.7 1.7 - 7.7 K/uL   Lymphocytes Relative 25 %   Lymphs Abs 1.8 0.7 - 4.0 K/uL   Monocytes Relative 8 %   Monocytes Absolute 0.6 0.1 - 1.0 K/uL   Eosinophils Relative 1 %   Eosinophils Absolute 0.1 0.0 - 0.5 K/uL   Basophils Relative 0 %   Basophils Absolute 0.0 0.0 - 0.1 K/uL   Immature Granulocytes 0 %   Abs Immature Granulocytes 0.03 0.00 - 0.07 K/uL  Lipase, blood     Status: Abnormal   Collection Time: 05/10/23  5:54 PM  Result Value Ref Range   Lipase 52 (H) 11 - 51 U/L  Protime-INR     Status: None   Collection Time: 05/10/23  6:11 PM  Result Value Ref Range   Prothrombin Time 13.5 11.4 - 15.2 seconds   INR 1.0 0.8 - 1.2  I-stat chem 8, ED     Status: Abnormal   Collection Time: 05/10/23  6:18 PM  Result Value Ref Range   Sodium 133 (L) 135 - 145 mmol/L   Potassium 4.4 3.5 - 5.1 mmol/L   Chloride 97 (L) 98 - 111 mmol/L   BUN 27 (H) 8 - 23 mg/dL   Creatinine, Ser 4.69 0.44 - 1.00 mg/dL   Glucose, Bld 629 (H) 70 - 99 mg/dL   Calcium, Ion 5.28 (L) 1.15 - 1.40 mmol/L   TCO2 27 22 - 32 mmol/L   Hemoglobin 13.3 12.0 -  15.0 g/dL   HCT 41.3 24.4 - 01.0 %  Blood culture (routine x 2)     Status: None (Preliminary result)   Collection Time: 05/10/23  9:10 PM   Specimen: BLOOD  Result Value Ref Range   Specimen Description      BLOOD BLOOD RIGHT ARM Performed at [facility], [address] [place], [place] [address]    Special Requests      BOTTLES DRAWN AEROBIC AND ANAEROBIC Blood Culture results may not be optimal due to an inadequate volume of blood received in culture bottles Performed at [facility], [address], [place], [place] [address]    Culture      NO GROWTH < 12 HOURS Performed at [facility], [address], [place], [place] [address]    Report Status PENDING   Glucose, capillary     Status: Abnormal   Collection Time: 05/10/23 10:15 PM  Result Value Ref Range   Glucose-Capillary 220 (H) 70 - 99 mg/dL  CBC     Status: Abnormal   Collection Time: 05/11/23  4:45 AM  Result Value Ref Range   WBC 6.6 4.0 - 10.5 K/uL   RBC 3.68 (L) 3.87 - 5.11 MIL/uL   Hemoglobin 11.3 (L) 12.0 - 15.0 g/dL   HCT 59.5 (L) 63.8 - 75.6 %   MCV 92.1 80.0 - 100.0 fL   MCH 30.7 26.0 - 34.0 pg   MCHC 33.3 30.0 -  36.0 g/dL   RDW 16.1 09.6 - 04.5 %   Platelets 141 (L) 150 - 400 K/uL   nRBC 0.0 0.0 - 0.2 %  Comprehensive metabolic panel     Status: Abnormal   Collection Time: 05/11/23  4:45 AM  Result Value Ref Range   Sodium 131 (L) 135 - 145 mmol/L   Potassium 3.8 3.5 - 5.1 mmol/L   Chloride 101 98 - 111 mmol/L   CO2 21 (L) 22 - 32 mmol/L   Glucose, Bld 201 (H) 70 - 99 mg/dL   BUN 17 8 - 23 mg/dL   Creatinine, Ser 4.09 0.44 - 1.00 mg/dL   Calcium 7.9 (L) 8.9 - 10.3 mg/dL   Total Protein 6.0 (L) 6.5 - 8.1 g/dL   Albumin 3.3 (L) 3.5 - 5.0 g/dL   AST 25 15 - 41 U/L   ALT 29 0 - 44 U/L   Alkaline Phosphatase 108 38 - 126 U/L   Total Bilirubin 0.8 0.3 - 1.2 mg/dL   GFR, Estimated >81 >19 mL/min   Anion gap 9 5 - 15  Magnesium     Status: Abnormal    Collection Time: 05/11/23  4:45 AM  Result Value Ref Range   Magnesium 1.6 (L) 1.7 - 2.4 mg/dL  Phosphorus     Status: None   Collection Time: 05/11/23  4:45 AM  Result Value Ref Range   Phosphorus 2.7 2.5 - 4.6 mg/dL  Blood culture (routine x 2)     Status: None (Preliminary result)   Collection Time: 05/11/23  4:46 AM   Specimen: BLOOD  Result Value Ref Range   Specimen Description      BLOOD BLOOD RIGHT ARM AEROBIC BOTTLE ONLY Performed at [facility], [address], [place], [place] [address]    Special Requests      BOTTLES DRAWN AEROBIC ONLY Blood Culture adequate volume Performed at [facility], [address], [place], [place] [address]    Culture      NO GROWTH <12 HOURS Performed at [facility], [address], [place], [place] [address]    Report Status PENDING   Glucose, capillary     Status: Abnormal   Collection Time: 05/11/23  7:31 AM  Result Value Ref Range   Glucose-Capillary 185 (H) 70 - 99 mg/dL  Glucose, capillary     Status: Abnormal   Collection Time: 05/11/23 12:02 PM  Result Value Ref Range   Glucose-Capillary 146 (H) 70 - 99 mg/dL    I have reviewed pertinent nursing notes, vitals, labs, and images as necessary. I have ordered labwork to follow up on as indicated.  I have reviewed the last notes from staff over past 24 hours. I have discussed patient's care plan and test results with nursing staff, CM/SW, and other staff as appropriate.  Time spent: Greater than 50% of the 55 minute visit was spent in counseling/coordination of care for the patient as laid out in the A&P.   LOS: 1 day   Lewie Chamber, MD [facility] 05/11/2023, 3:23 PM

## 2023-05-11 NOTE — Hospital Course (Addendum)
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  She also has been recently hospitalized 5/21 - 5/31 for similar uncontrolled pain.  She underwent celiac plexus nerve block and was discharged on MS Contin and oxycodone however pain regimen was not similar to inpatient regimen.   She was readmitted for further pain control and to rule out underlying UTI given right flank pain.  UA was notable for large LE, negative nitrite, greater than 50 WBC, many bacteria and she was therefore started on antibiotics on admission as well. Oncology and palliative care were consulted as well. Pain regimen was adjusted during hospitalization and better pain control was achieved.  She was treated for a UTI as well and completed abx inpatient.  After GOC discussions, she was recommended for transitioning to hospice at discharge. She was evaluated by physical therapy as well during hospitalization and not felt to warrant needing SNF.  Patient was unable to afford assisted or independent living and therefore she was arranged for home health aide and RN at discharge as well as establishing care with hospice.  Given her poor prognosis and expected physical decline, she may warrant further hospitalization and/or placement with residential hospice once she is no longer able to live alone as she has no reliable care from family/friends.

## 2023-05-11 NOTE — Consult Note (Addendum)
Consultation Note Date: 05/11/2023   Patient Name: [name]  DOB: 22-Jan-1961  MRN: [id number]  Age / Sex: 62 y.o., female   PCP: Chrys Racer, MD Referring Physician: Lewie Chamber, MD  Reason for Consultation: Establishing goals of care     Chief Complaint/History of Present Illness:   Patient is a 62 year old female with a past medical history of rheumatic heart disease status post MV and TV repair in 04/2022, history of pericardial effusion/tamponade status post pericardiocentesis in 05/2022, PAF on Coumadin, CKD, COPD, type 2 diabetes, hypertension, tobacco use, and recent diagnosis of 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  Since admission, leaving management for complicated UTI and right pyelonephritis.  Of note patient had recently been admitted from 5/21 - 5/31 for pain management.  Patient has been unable to follow-up with outpatient providers including oncologist.  Palliative medicine team consulted to assist with complex medical decision making and symptom management. Of note patient known to this provider from recent hospitalization.  Reviewed EMR prior to presenting to bedside.  Appears that at time of discharge, hospitalist's discharge note did not appropriately reflect opioid medication regimen patient had been getting in the hospital.  Patient was prescribed the correct medications by outpatient palliative medicine provider though unsure if she was instructed to take these medications appropriately as was not and hospitalist's discharge instructions. Since being admitted, as per EMR review patient has received IV Dilaudid total of 3 mg, oxycodone 5 mg x 3 doses, and gabapentin 300 mg 3 times daily.  When patient was last discharged from hospital as per palliative medicine notes, was supposed to be taking OxyContin 40 mg every 8 hours and oxycodone 20 mg daily 3 hours  as needed.  Patient had celiac plexus block performed on 5/29.  Presented to bedside in AM to meet with patient.  Patient remembered this provider from her recent hospitalization.  Again introduced myself as a member of the palliative medicine team.  Patient notes that when she went home, she was confused about her medications as it was not what she had been prescribed here in the hospital.  Patient had a hard time caring for herself at home and even getting out of the bed due to her weakness.  Patient notes she now has a skin wound on her back from lying in bed.  Patient notes that no family is able to assist her with care at home.  Patient able to discuss that the recurrent pain brought her back into the hospital.  Patient has heard she has a urinary tract infection now.  Patient notes she was not able to follow-up with any outpatient providers due to this rehospitalization.  Patient notes she was supposed to follow-up with palliative medicine clinic today.  Able to review patient's medications at this time.  Patient notes that the oxycodone 5 mg does not begin to touch her pain.  Acknowledged this because she had been on 20 mg while in the hospital.  Noted would change her dosing appropriately back to 20 mg as patient feels that dose of oxycodone helps with her appropriate pain management.  Also discussed continuing IV Dilaudid for breakthrough pain.  And based on patient's OME needs, would consider restarting her long-acting medication.  Patient agreeing with this plan.  With permission, spent time discussing patient's current medical status.  Expressed concerns related to her increased weakness, inability to take care of herself,  inability to perform basic ADLs at home, and concern that her cancer has continued to progress and make her weaker and will continue to do so.  Acknowledged hearing this.  Expressed concern that patient's time is getting shorter and patient noted "I have that feeling too".  Noted  would inform Dr. Blake Divine of admission so she could visit with patient.  Also discussed with her worry that due to her poor performance status chemotherapy may cause her more harm than benefit.  Patient is not able to stay out of the hospital long enough to follow-up with oncology.  Patient acknowledged worry about this as well.  Spent time inquiring if patient can take care of herself at home.  Patient noted that she is too weak and not even able to get out of bed at this point.  She is inquiring about help at home so noted would involve TOC to assist with coordination of care.  Also discussed realistically patient may need long-term care placement to reach her care needs for comfort.  With permission, able to discuss possible pathways for medical care moving forward including continuing with aggressive medical care versus transitioning to comfort focused care with hospice.  Patient noted that her priorities are pain management and comfort knowing that she is getting weaker and her life is getting shorter.  Patient appropriately grieving the loss of her independence and discussions regarding hospice care.  Patient planning to follow-up with Dr. Mosetta Putt as well this afternoon regarding this.  Patient also requested that I speak to her sister to update her regarding this entire conversation.  Patient was able to call her sister while present at bedside and able to update her sister who is her HCPOA, about the entire conversation I had with patient.*Acknowledged this and is also appropriately grieving.  Sister noted she would not be able to provide any support for the patient's care at home.  Acknowledged this and noted involvement of case management.  All questions answered at that time.  Also with patient's permission, able to discuss her CODE STATUS.  Discussed that with her metastatic cancer, concerned interventions such as cardiac resuscitation if her heart were to stop or mechanical ventilation if she were to  stop breathing, would not bring her a good quality of life.  Patient can very quickly states she does not want to be on life support and would want to be allowed to pass naturally.  Noted would change CODE STATUS to DNR appropriately then.  Patient agreeing with CODE STATUS change.  Answered all questions as able at that time.  Noted would reach out to multiple members of care team to coordinate and also inform Dr. Mosetta Putt of discussion today about possible transition to hospice care.  Spent time providing emotional support via active listening.  Thanked patient for allowing me to visit with her today.    Primary Diagnoses  Present on Admission:  Complicated UTI (urinary tract infection)  Palliative Review of Systems: Abdominal  pain  Past Medical History:  Diagnosis Date   Acute pericardial effusion 05/17/2022   AKI (acute kidney injury) (HCC) 05/17/2022   Anxiety    Arthritis    Cervicalgia    CHF (congestive heart failure) (HCC)    Chondromalacia of patella    Chronic neck pain    Chronic pain syndrome    Depression    secondary to loss of her son at age 58   Diabetic neuropathy (HCC)    DMII (diabetes mellitus, type 2) (HCC)  Dysrhythmia    Enthesopathy of hip region    Fibromyalgia    GERD (gastroesophageal reflux disease)    Hep C w/o coma, chronic (HCC)    Hepatitis C    diagnosed 2005   Hypertension    Insomnia    Low back pain    Lumbago    Mitral regurgitation    Obesity    Pain in joint, upper arm    Primary localized osteoarthrosis, lower leg    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   Substance abuse (HCC)    sober since 2001   Tobacco abuse    Tricuspid regurgitation    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   Social History   Socioeconomic History   Marital status: Single    Spouse name: Not on file   Number of children: 2   Years of education: Not on file   Highest education level: Not on file  Occupational History   Occupation:  CNA    Comment: worked for 15 years   Occupation: disability  Tobacco Use   Smoking status: Every Day    Packs/day: 0.25    Years: 35.00    Additional pack years: 0.00    Total pack years: 8.75    Types: Cigarettes   Smokeless tobacco: Never   Tobacco comments:    4 cigarettes a day  Vaping Use   Vaping Use: Some days  Substance and Sexual Activity   Alcohol use: No   Drug use: No   Sexual activity: Not Currently    Comment: Celebate since 2000  Other Topics Concern   Not on file  Social History Narrative   Born and raised by mom until mom died when she was 14yo. After that she stayed with her aunt. Dad was married to someone else and was never around.  Pt has 1 brother and 1 sister and pt is the middle child. Pt had a very rough childhood. Never married. 2 kids one son died several  yrs ago and other son has Schizophrenia. Pt has graduated HS and has some college. Pt is on disability and is unemployed. She used to work as a Lawyer until 2000.       Financial assistance approved for 100% discount at [facility] and has [facility] card   Rudell Cobb  September 29, 2010 2:27 PM   Social Determinants of Health   Financial Resource Strain: Not on file  Food Insecurity: No Food Insecurity (04/27/2023)   Hunger Vital Sign    Worried About Running Out of Food in the Last Year: Never true    Ran Out of Food in the Last Year: Never true  Transportation Needs: No Transportation Needs (04/27/2023)   PRAPARE - Administrator, Civil Service (Medical): No    Lack of Transportation (Non-Medical): No  Physical Activity: Not on file  Stress: Not on file  Social Connections: Not on file   Family History  Problem Relation Age of Onset   Uterine cancer Mother    Alcohol abuse Father    Alcohol abuse Sister    Drug abuse Sister    Drug abuse Brother    Alcohol abuse Brother    Schizophrenia Son    Diabetes Other    Arthritis Other    Hypertension Other    Heart attack Son 47       Died  suddenly   Scheduled Meds:  atorvastatin  20 mg Oral Daily   buPROPion  300 mg Oral Daily   gabapentin  300 mg Oral TID   insulin aspart  0-5 Units Subcutaneous QHS   insulin aspart  0-9 Units Subcutaneous TID WC   metoprolol tartrate  12.5 mg Oral BID   pantoprazole  40 mg Oral Daily   warfarin  2.5-5 mg Oral See admin instructions   Continuous Infusions:  sodium chloride 75 mL/hr at 05/10/23 2231   cefTRIAXone (ROCEPHIN)  IV 2 g (05/10/23 2143)   PRN Meds:.acetaminophen, HYDROmorphone (DILAUDID) injection, oxyCODONE, polyethylene glycol, prochlorperazine Allergies  Allergen Reactions   Norco [Hydrocodone-Acetaminophen] Itching and Other (See Comments)    Tolerates Oxycodone   CBC:    Component Value Date/Time   WBC 6.6 05/11/2023 0445   HGB 11.3 (L) 05/11/2023 0445   HGB 9.5 (L) 12/20/2022 1231   HCT 33.9 (L) 05/11/2023 0445   HCT 31.7 (L) 12/20/2022 1231   PLT 141 (L) 05/11/2023 0445   PLT 256 12/20/2022 1231   MCV 92.1 05/11/2023 0445   MCV 82 12/20/2022 1231   NEUTROABS 4.7 05/10/2023 1754   LYMPHSABS 1.8 05/10/2023 1754   MONOABS 0.6 05/10/2023 1754   EOSABS 0.1 05/10/2023 1754   BASOSABS 0.0 05/10/2023 1754   Comprehensive Metabolic Panel:    Component Value Date/Time   NA 131 (L) 05/11/2023 0445   NA 141 02/28/2023 0958   K 3.8 05/11/2023 0445   CL 101 05/11/2023 0445   CO2 21 (L) 05/11/2023 0445   BUN 17 05/11/2023 0445   BUN 29 (H) 02/28/2023 0958   CREATININE 0.64 05/11/2023 0445   CREATININE 1.28 (H) 01/02/2021 0942   GLUCOSE 201 (H) 05/11/2023 0445   CALCIUM 7.9 (L) 05/11/2023 0445   AST 25 05/11/2023 0445   ALT 29 05/11/2023 0445   ALKPHOS 108 05/11/2023 0445   BILITOT 0.8 05/11/2023 0445   PROT 6.0 (L) 05/11/2023 0445   ALBUMIN 3.3 (L) 05/11/2023 0445    Physical Exam: Vital Signs: BP (!) 178/111 (BP Location: Right Arm)   Pulse (!) 52   Temp 98.1 F (36.7 C) (Oral)   Resp 16   Ht 5\' 11"  (1.803 m)   Wt 70.5 kg   LMP 12/06/1976    SpO2 100%   BMI 21.69 kg/m  SpO2: SpO2: 100 % O2 Device: O2 Device: Room Air O2 Flow Rate:   Intake/output summary:  Intake/Output Summary (Last 24 hours) at 05/11/2023 1610 Last data filed at 05/11/2023 0741 Gross per 24 hour  Intake 636.09 ml  Output --  Net 636.09 ml   LBM: Last BM Date : 05/10/23 Baseline Weight: Weight: 78.5 kg Most recent weight: Weight: 70.5 kg  General: NAD, alert, laying in bed, chronically ill-appearing, cachectic Eyes: No drainage noted HENT: moist mucous membranes Cardiovascular: RRR Respiratory: no increased work of breathing noted, not in respiratory distress Abdomen: distended Skin: Multiple ecchymoses on bilateral lower extremities Neuro: A&Ox4, following commands easily Psych: appropriately answers all questions          Palliative Performance Scale: 40%              Additional Data Reviewed: Recent Labs    05/10/23 1754 05/10/23 1818 05/11/23 0445  WBC 7.2  --  6.6  HGB 13.0 13.3 11.3*  PLT 169  --  141*  NA 131* 133* 131*  BUN 25* 27* 17  CREATININE 0.89 0.70 0.64    Imaging: CT ABDOMEN PELVIS W CONTRAST CLINICAL DATA:  Abdominal pain for several weeks, initial encounter  EXAM: CT ABDOMEN AND  PELVIS WITH CONTRAST  TECHNIQUE: Multidetector CT imaging of the abdomen and pelvis was performed using the standard protocol following bolus administration of intravenous contrast.  RADIATION DOSE REDUCTION: This exam was performed according to the departmental dose-optimization program which includes automated exposure control, adjustment of the mA and/or kV according to patient size and/or use of iterative reconstruction technique.  CONTRAST:  OMNIPAQUE IOHEXOL 300 MG/ML  SOLN  COMPARISON:  04/24/2023  FINDINGS: Lower chest: Lung bases show no focal infiltrate or sizable effusion. A pleural based nodule is noted which measures approximately 6 mm which is stable from the prior exam.  Hepatobiliary: Multiple  peripherally enhancing lesions are identified throughout the liver similar to that seen on prior CT examination consistent with metastatic disease. The gallbladder is decompressed.  Pancreas: Pancreas again demonstrates a predominately hypodense mass lesion within the body and tail of the pancreas similar to that seen on prior CT examination consistent with the known pancreatic malignancy.  Spleen: Normal in size without focal abnormality.  Adrenals/Urinary Tract: Adrenal glands are within normal limits. Kidneys demonstrate a normal enhancement pattern bilaterally. Normal excretion is noted. No renal calculi or obstructive changes are seen. The bladder is decompressed. There is air in within the anterior aspect of the bladder wall suspicious for emphysematous cystitis. Correlate with urinalysis. These changes are new from the prior exam.  Stomach/Bowel: No obstructive or inflammatory changes of the colon are seen. The appendix is not well visualized and may have been surgically removed. No inflammatory changes are seen. Small bowel and stomach are unremarkable.  Vascular/Lymphatic: Atherosclerotic calcifications of the abdominal aorta are noted. No aneurysmal dilatation is seen. There is again noted encasement of the celiac axis as well as a fat plane adjacent to the superior mesenteric artery consistent with localized neoplastic invasion. 18 mm short axis node is noted in the portacaval space stable from the prior exam. Splenic vein occlusion is not again seen with some collateral flow identified.  Reproductive: Status post hysterectomy. No adnexal masses.  Other: No abdominal wall hernia or abnormality. No abdominopelvic ascites.  Musculoskeletal: Degenerative changes of lumbar spine are seen.  IMPRESSION: Changes consistent with the known history of pancreatic neoplasm stable in appearance from the recent exam. Associated vascular encasement is noted as well as splenic  vein occlusion and adjacent portacaval adenopathy.  Hepatic metastatic disease is again identified and stable.  Stable 6 mm subpleural nodule in the right lower lobe. This is stable dating back to January of 2022 and likely of a benign etiology.  No acute abnormality is noted.  Electronically Signed   By: Alcide Clever M.D.   On: 05/10/2023 19:26    I personally reviewed recent imaging.   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  Since admission, leaving management for complicated UTI and right pyelonephritis.  Of note patient had recently been admitted from 5/21 - 5/31 for pain management.  Patient has been unable to follow-up with outpatient providers including oncologist.  Palliative medicine team consulted to assist with complex medical decision making  and symptom management. Of note patient known to this provider from recent hospitalization..  # Complex medical decision making/goals of care  -Extensive conversation with patient as described above in HPI.  Also updated her sister by phone on conversation while present at bedside.  Reviewed possible pathways for medical care moving forward.  Expressed worry that due to patient's worsening functional status, cancer directed therapies may cause harm harm than benefit.  Patient unable to perform basic ADLs anymore and does not have care assistance at home.  Realistically discussed patient may need long-term care placement with  hospice support.  Awaiting oncologist, Dr. Latanya Maudlin, input after discussion with patient this afternoon which is greatly appreciated.  -  Code Status: DNR    -Discussed CODE STATUS as described above with patient in detail.  The patient is sick and if her heart were to stop or she were to stop breathing, patient would want to be allowed to have a natural death.  Patient does not want cardiac resuscitation or mechanical intubation. Prognosis: < 6 months  # Symptom management  -Pain, acute in setting of metastatic pancreatic cancer During last hospitalization, had patient on pain regiment including OxyContin 40 mg every 8 hours and oxycodone 20 mg every 3 hours as needed.  As per hospitalist discharge summary, these medications were not continued despite being prescribed by palliative medicine provider.   -Change IV Dilaudid to 0.5 mg every hour as needed   -Change oxycodone to 20 mg every 3 hours as needed   -Based on patient's OME needs will likely need a long-acting medication added back to regimen   -Continue gabapentin 300 mg 3 times daily   -Received celiac plexus block on 5/29  # Psycho-social/Spiritual Support:  - Support System: sister, has developmentally disabled 45yo son in group home  # Discharge Planning:  To Be Determined  Thank you for allowing the palliative care team to participate in the care [name].  Alvester Morin, DO Palliative Care Provider PMT # [phone]  UPDATE: Contacted by RN in the evening. Patient and her sister met with Dr. Mosetta Putt. Patient agreeing with pursuing hospice support. Comfort is the priority. Patient asking to restart pain medications she was receiving last hospitalization. Will restart OxyContin 40mg  q8hrs and continue oxycodone 20mg  q3hrs prn. Continue to adjust based on symptom burden.   If patient remains symptomatic despite maximum doses, please call PMT at [phone] between 0700 and 1900. Outside of these hours, please call attending,  as PMT does not have night coverage.  *Please note that this is a verbal dictation therefore any spelling or grammatical errors are due to the "[employer]" system interpretation.

## 2023-05-11 NOTE — Progress Notes (Signed)
  Daily Progress Note   Patient Name: [name]       Date: 05/11/2023 DOB: 09-21-1961  Age: 62 y.o. MRN#: [id number] Attending Physician: Lewie Chamber, MD Primary Care Physician: Chrys Racer, MD Admit Date: 05/10/2023 Length of Stay: 1 day  Will plan to complete full consult note as soon as able. Discussed care with patient this morning. Reviewed code status and patient can clearly state if she is sick enough her heart stops or she stops breathing, she does NOT want cardiac resuscitation or mechanical ventilation. Would would to be allowed to have a natural death. Code status appropriately changed to DNR.    Alvester Morin, DO Palliative Care Provider PMT # [phone]

## 2023-05-12 ENCOUNTER — Other Ambulatory Visit: Payer: Self-pay

## 2023-05-12 DIAGNOSIS — G893 Neoplasm related pain (acute) (chronic): Secondary | ICD-10-CM | POA: Diagnosis not present

## 2023-05-12 DIAGNOSIS — R4589 Other symptoms and signs involving emotional state: Secondary | ICD-10-CM

## 2023-05-12 DIAGNOSIS — F411 Generalized anxiety disorder: Secondary | ICD-10-CM

## 2023-05-12 DIAGNOSIS — C259 Malignant neoplasm of pancreas, unspecified: Secondary | ICD-10-CM | POA: Diagnosis not present

## 2023-05-12 DIAGNOSIS — N3000 Acute cystitis without hematuria: Secondary | ICD-10-CM | POA: Diagnosis not present

## 2023-05-12 LAB — PROTIME-INR
INR: 1.2 (ref 0.8–1.2)
Prothrombin Time: 15.6 seconds — ABNORMAL HIGH (ref 11.4–15.2)

## 2023-05-12 LAB — CBC
HCT: 30.5 % — ABNORMAL LOW (ref 36.0–46.0)
Hemoglobin: 10.2 g/dL — ABNORMAL LOW (ref 12.0–15.0)
MCH: 31 pg (ref 26.0–34.0)
MCHC: 33.4 g/dL (ref 30.0–36.0)
MCV: 92.7 fL (ref 80.0–100.0)
Platelets: 128 10*3/uL — ABNORMAL LOW (ref 150–400)
RBC: 3.29 MIL/uL — ABNORMAL LOW (ref 3.87–5.11)
RDW: 13.7 % (ref 11.5–15.5)
WBC: 5.7 10*3/uL (ref 4.0–10.5)
nRBC: 0 % (ref 0.0–0.2)

## 2023-05-12 LAB — GLUCOSE, CAPILLARY
Glucose-Capillary: 198 mg/dL — ABNORMAL HIGH (ref 70–99)
Glucose-Capillary: 156 mg/dL — ABNORMAL HIGH (ref 70–99)
Glucose-Capillary: 195 mg/dL — ABNORMAL HIGH (ref 70–99)
Glucose-Capillary: 190 mg/dL — ABNORMAL HIGH (ref 70–99)

## 2023-05-12 LAB — URINE CULTURE: Culture: >=100000 — AB

## 2023-05-12 LAB — CULTURE, BLOOD (ROUTINE X 2)

## 2023-05-12 MED ORDER — ARIPIPRAZOLE 5 MG PO TABS
2.5000 mg | ORAL_TABLET | Freq: Every day | ORAL | Status: DC
  Administered 2023-05-12 – 2023-05-13 (×2): 2.5 mg via ORAL
  Filled 2023-05-12 (×2): qty 1

## 2023-05-12 MED ORDER — VORTIOXETINE HBR 5 MG PO TABS
10.0000 mg | ORAL_TABLET | Freq: Every day | ORAL | Status: DC
  Administered 2023-05-12 – 2023-05-13 (×2): 10 mg via ORAL
  Filled 2023-05-12 (×2): qty 2

## 2023-05-12 MED ORDER — WARFARIN SODIUM 5 MG PO TABS
7.5000 mg | ORAL_TABLET | Freq: Once | ORAL | Status: AC
  Administered 2023-05-12: 7.5 mg via ORAL
  Filled 2023-05-12: qty 1

## 2023-05-12 NOTE — Progress Notes (Signed)
Daily Progress Note   Patient Name: [name]       Date: 05/12/2023 DOB: 13-Jun-1961  Age: 62 y.o. MRN#: [id number] Attending Physician: Lewie Chamber, MD Primary Care Physician: Chrys Racer, MD Admit Date: 05/10/2023 Length of Stay: 2 days  Reason for Consultation/Follow-up: Establishing goals of care and symptom management  Subjective:   CC:  Patient notes pain greatly improved today. Following up regarding complex medical decision making and symptom management.  Subjective: Viewed EMR prior to presenting to bedside.  At time of EMR review patient had received IV Dilaudid 0.5 mg x 7 doses, oxycodone 20 mg x 1 dose, and yesterday evening had received scheduled OxyContin 40 mg every 8 hours.  Presented to bedside in AM to check on patient's symptom management.  Patient seen sitting up in chair at bedside.  Patient welcoming this provider to visit.  Patient able to discussed that her pain is feeling much better at this time after pain medication adjustments.  Acknowledged this and will continue on current regimen.  Patient also able to discuss that she has found peace this morning with everything going on.  She appreciated Dr. Mosetta Putt coming by yesterday evening to also speak with her and her sister.  She feels like everything is falling into place.  Patient was able to call her 4 year old disabled son and tell him that she is "dying from cancer".  She said her and her son had a lovely phone conversation about how much he appreciated her being his mother and they reminisced about great times they have had together.  Spent time providing emotional support via active listening.  Patient able to state that she does not feel she can return home as cannot care for herself.  Patient hoping to go to a facility such as independent living or assisted living to focus on symptom management and quality of life there.  She mentioned that her sister is hoping to assist with coordination of placement.   Patient able to call her sister and put her on speaker phone so we could all discuss plans moving forward.  Sister acknowledged that patient is not safe at home and so she would be helping to find patient a facility such as independent living or assisted living to help care for patient at this time with her continued weakening state.  Discussed that once facility is determined, would recommend choosing a hospice group as some facilities partner with particular hospice group.  Patient and sister agreeing with wanting hospice involved once find facility support for patient.  Spent time answering questions and providing emotional support as needed.  Thanked patient and her sister for allowing me to visit with him today.  Discussed care with team after visit with patient and discussion with her sister.  Review of Systems Pain improved Objective:   Vital Signs:  BP (!) 136/90 (BP Location: Right Arm)   Pulse 91   Temp 98.4 F (36.9 C) (Oral)   Resp 16   Ht 5\' 11"  (1.803 m)   Wt 70.5 kg   LMP 12/06/1976   SpO2 100%   BMI 21.69 kg/m   Physical Exam: General: NAD, alert,sitting in bedside chair, chronically ill-appearing, cachectic Eyes: No drainage noted HENT: moist mucous membranes Cardiovascular: RRR Respiratory: no increased work of breathing noted, not in respiratory distress Abdomen: distended Skin: Multiple ecchymoses on bilateral lower extremities Neuro: A&Ox4, following commands easily Psych: appropriately answers all questions  Imaging:  I personally reviewed recent imaging.   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  Since admission, leaving management for complicated UTI and right pyelonephritis.  Of note patient had recently been admitted from 5/21 - 5/31 for pain management.  Patient has been unable to follow-up with outpatient providers including oncologist.  Palliative medicine team consulted to assist with complex medical decision making and symptom management. Of note patient known to this provider from recent hospitalization.  Recommendations/Plan: # Complex medical decision making/goals of care:  - Patient voicing "peace" after all discussions with providers yesterday.  Patient knows that she is dying from cancer and wants to focus on symptom management at the end of her life.  Patient able to have a discussion with her son this morning regarding her situation; this has also brought her a level of peace.  -Patient and sister are hoping to find placement for patient as she cannot take care of herself at home.  Sister noting hope to discuss with TOC about independent and/or assisted living facilities.  -Patient and sister agreeing with hospice support once patient discharged.  Patient and sister are awaiting to determine facility so then can determine hospice group support.                -  Code Status: DNR  Prognosis: < 6 months   # Symptom management                -Pain, acute in setting of metastatic pancreatic cancer                               -Continue OxyContin 40mg  q8hrs scheduled during the day   -Continue IV Dilaudid to 0.5 mg every hour as needed                               -Continue oxycodone to 20 mg every 3 hours as needed                               -Continue gabapentin 300 mg 3 times daily                               -Received celiac plexus block on 5/29   # Psycho-social/Spiritual Support:  - Support System: sister, has developmentally disabled 45yo son in group home   # Discharge Planning:   Patient and her sister are seeking long term placement, such as in ILF or ALF, as patient unable to complete basic ADLs as home. Patient and sister agreeing with hospice support at facility.   Discussed with: patient, patient's sister, hosptialist, TOC  Thank you for allowing the palliative care team to participate in the care [name].  Alvester Morin, DO Palliative Care Provider PMT # [phone]  If patient remains symptomatic despite maximum doses, please call PMT at [phone] between 0700 and 1900. Outside of these hours, please call attending, as PMT does not have night  coverage.  This provider spent a total of 52 minutes providing patient's care.  Includes review of EMR, discussing care with other staff members involved in patient's medical care, obtaining relevant history and information from patient and/or patient's family, and personal review of imaging and lab work. Greater than 50% of the time was spent counseling and coordinating care related to the above assessment and plan.    *Please note that this is a verbal dictation therefore any spelling or grammatical errors are due to the "[employer] One" system interpretation.

## 2023-05-12 NOTE — Progress Notes (Signed)
ANTICOAGULATION CONSULT NOTE  Pharmacy Consult for Warfarin Indication: atrial fibrillation  Allergies  Allergen Reactions   Norco [Hydrocodone-Acetaminophen] Itching and Other (See Comments)    Tolerates Oxycodone    Patient Measurements: Height: 5\' 11"  (180.3 cm) Weight: 70.5 kg (155 lb 8 oz) IBW/kg (Calculated) : 70.8  Vital Signs: Temp: 98.4 F (36.9 C) (06/06 0503) Temp Source: Oral (06/06 0503) BP: 136/90 (06/06 0503) Pulse Rate: 91 (06/06 0503)  Labs: Recent Labs    05/10/23 1754 05/10/23 1811 05/10/23 1818 05/11/23 0445 05/12/23 0406  HGB 13.0  --  13.3 11.3* 10.2*  HCT 38.3  --  39.0 33.9* 30.5*  PLT 169  --   --  141* 128*  LABPROT  --  13.5  --   --  15.6*  INR  --  1.0  --   --  1.2  CREATININE 0.89  --  0.70 0.64  --      Estimated Creatinine Clearance: 80.1 mL/min (by C-G formula based on SCr of 0.64 mg/dL).  Assessment: [name] is a 62 y.o. female with PMH Afib on warfarin PTA, newly diagnosed metastasized pancreatic adenocarcinoma, presenting with abdominal pain and right flank pain, and found to have complicated UTI with R-sided pyelonephritis. Recently admitted 5/21 - 5/31, during which time warfarin was reversed for liver lesion biopsy and port placement.   Baseline INR subtherapeutic (likely multiple missed doses) Prior anticoagulation: warfarin 5 mg daily, except 2.5 mg on Sundays Last dose unknown, but prior to 6/4  Significant events:  Today, 05/12/2023: CBC: Hgb and Plt both slightly low, likely both hemodilution and acute infection playing a role INR remains subtherapeutic but slightly up from yesterday after resuming home dose Major drug interactions: none noted Broad spectrum antibiotics may increase warfarin sensitivity No bleeding issues per nursing Eating 25% of meals (incomplete charting)  Goal of Therapy: INR 2-3  Plan: Warfarin 7.5 mg PO tonight at 16:00 - use caution with boosted doses, given multiple risk  factors for increased warfarin sensitivity (acute illness, antibiotics, reduced PO intake) Daily INR CBC at least q72 hr while on warfarin Monitor for signs of bleeding or thrombosis   Bernadene Person, PharmD, BCPS [phone] 05/12/2023, 8:50 AM

## 2023-05-12 NOTE — TOC Progression Note (Addendum)
Transition of Care [facility]) - Progression Note    Patient Details  Name: [name] MRN: [id number] Date of Birth: [birth year]  Transition [facility]) CM/SW Contact  Adrian Prows, RN Phone Number: 05/12/2023, 11:47 AM  Clinical Narrative:    PT recc for HHPT; spoke w/ pt over phone; pt declines service and say "I don't want that"; pt also says she does not want recc rolling walker because she has a Rolator.  -[id number]- spoke w/ pt and sister in room; they were given list of home  hospice services; also explained if AL or IL are desired; they would need to identify facility, and cost will be out of pocket; pt does not have long term benefit w/ her medicare; also explained that medicaid does not cover AL or IL; they will contact her insurance to see what is covered;  they called Hospital doctor at [facility]  while the [facility] was in room; she was placed on speaker phone;and  confirmed this these are not  covered; pt and her sister would like for [facility] Aide for assist, and HHRN for medications; pt and sister will also decide if they will utilize home hospice home hospice; Dr Frederick Peers notified of pt request; awaiting pt decision.     Barriers to Discharge: Continued Medical Work up  Expected Discharge Plan and Services   Discharge Planning Services: CM Consult   Living arrangements for the past 2 months: Apartment                                       Social Determinants of Health (SDOH) Interventions SDOH Screenings   Food Insecurity: No Food Insecurity (05/12/2023)  Housing: Low Risk  (05/12/2023)  Transportation Needs: No Transportation Needs (05/12/2023)  Utilities: Not At Risk (05/12/2023)  Recent Concern: Utilities - At Risk (04/04/2023)  Tobacco Use: High Risk (05/10/2023)    Readmission Risk Interventions    05/11/2023   11:31 AM 04/08/2023    2:15 PM 08/06/2022    2:13 PM  Readmission Risk Prevention Plan  Transportation Screening Complete Complete Complete  Medication Review  Oceanographer) Complete  Complete  PCP or Specialist appointment within 3-5 days of discharge Complete  Complete  HRI or Home Care Consult Complete Complete Complete  SW Recovery Care/Counseling Consult Complete Complete Complete  Palliative Care Screening Complete Not Applicable Not Applicable  Skilled Nursing Facility Not Applicable Not Applicable Not Applicable

## 2023-05-12 NOTE — Evaluation (Signed)
Physical Therapy Evaluation Patient Details Name: [name] MRN: [id number] DOB: Apr 29, 1961 Today's Date: 05/12/2023  History of Present Illness  Pt is a 62 y/o F admitted on 05/10/23 after presenting to the ED with c/o diffuse abdominal pain & R flank pain. Pt recently hospitalized 5/21-5/31 for similar uncontrolled pain & underwent celiac plexus nerve block. Pt has been readmitted for further pain control & is being treated for acute cystitis. PMH: stage IV pancreatic adenocarcinoma (diagnosed May 2024), rheumatic heart disease s/p repair May 2023, pericardial effusion/tamponade s/p pericardiocentesis June 2023, PAF on coumadin, CKD 3A, COPD, DM2, HTN  Clinical Impression  Pt seen for PT evaluation with pt agreeable to tx. Pt reports she lives alone without assistance in a 1 level apartment with level entry. Pt actively engaged in conversation with PT & reports she cannot care for herself but elaborates that she does not feel like getting OOB vs she cannot. On this date, pt is able to complete bed mobility & transfers independently, does require RW for increased balance & support during gait but is able to ambulate >150 ft with AD, and toilets without assistance. Will continue to follow pt acutely to address strengthening, balance, & gait without AD.       Recommendations for follow up therapy are one component of a multi-disciplinary discharge planning process, led by the attending physician.  Recommendations may be updated based on patient status, additional functional criteria and insurance authorization.  Follow Up Recommendations       Assistance Recommended at Discharge PRN  Patient can return home with the following  Assistance with cooking/housework    Equipment Recommendations Rolling walker (2 wheels)  Recommendations for Other Services       Functional Status Assessment Patient has had a recent decline in their functional status and demonstrates the ability to make  significant improvements in function in a reasonable and predictable amount of time.     Precautions / Restrictions Precautions Precautions: Fall Restrictions Weight Bearing Restrictions: No      Mobility  Bed Mobility Overal bed mobility: Independent             General bed mobility comments: supine<>sit    Transfers Overall transfer level: Modified independent                 General transfer comment: STS from EOB, toilet in bathroom    Ambulation/Gait Ambulation/Gait assistance: Supervision, Modified independent (Device/Increase time) Gait Distance (Feet): 200 Feet Assistive device: Rolling walker (2 wheels), None Gait Pattern/deviations: Decreased step length - right, Decreased step length - left, Decreased stride length Gait velocity: decreased     General Gait Details: Pt initially ambulates 5 ft without AD but pt frequently reaching/holding to furniture in room for support. Provided pt with RW & pt ambulate to ambulate to end of hallway & back with supervision fade to mod I.  Stairs            Wheelchair Mobility    Modified Rankin (Stroke Patients Only)       Balance Overall balance assessment: Needs assistance   Sitting balance-Leahy Scale: Good     Standing balance support: During functional activity, No upper extremity supported Standing balance-Leahy Scale: Fair                               Pertinent Vitals/Pain Pain Assessment Pain Assessment: Faces Faces Pain Scale: Hurts whole lot Pain Location: stomach>low back Pain Descriptors /  Indicators: Aching Pain Intervention(s): Monitored during session, Premedicated before session    Home Living Family/patient expects to be discharged to:: Private residence Living Arrangements: Alone   Type of Home: Apartment Home Access: Level entry       Home Layout: One level Home Equipment: Rollator (4 wheels);Cane - single point;BSC/3in1;Wheelchair - manual;Grab bars -  tub/shower;Shower seat;Hand held shower head      Prior Function               Mobility Comments: Pt reports she is independent without AD but will use her w/c or rollator as needed when she has felt dizzy. Pt notes 2 falls in the past month 2/2 lightheadedness & dizziness. Pt reports she stays in bed the majority of the day because she doesn't feel like getting OOB. ADLs Comments: Pt reports decreased appetite.     Hand Dominance        Extremity/Trunk Assessment   Upper Extremity Assessment Upper Extremity Assessment: Generalized weakness    Lower Extremity Assessment Lower Extremity Assessment: Generalized weakness    Cervical / Trunk Assessment Cervical / Trunk Assessment: Normal  Communication   Communication: No difficulties  Cognition Arousal/Alertness: Awake/alert Behavior During Therapy: WFL for tasks assessed/performed Overall Cognitive Status: Within Functional Limits for tasks assessed                                 General Comments: Pt actively engaging in conversation with PT throughout session.        General Comments General comments (skin integrity, edema, etc.): Pt toileted without assistance.    Exercises Other Exercises Other Exercises: Pt performed 5x STS from recliner without BUE support with min increasing to mod assist as pt fatigues; activity focused on BLE strengthening & endurance training. PT provides cuing for normal vs narrow BOS.   Assessment/Plan    PT Assessment Patient needs continued PT services  PT Problem List Decreased strength;Decreased balance;Decreased mobility;Decreased activity tolerance;Decreased knowledge of use of DME       PT Treatment Interventions DME instruction;Therapeutic exercise;Gait training;Balance training;Stair training;Neuromuscular re-education;Functional mobility training;Patient/family education;Therapeutic activities;Modalities    PT Goals (Current goals can be found in the Care Plan  section)  Acute Rehab PT Goals Patient Stated Goal: decreased pain PT Goal Formulation: With patient Time For Goal Achievement: 05/26/23 Potential to Achieve Goals: Fair    Frequency Min 3X/week     Co-evaluation               AM-PAC PT "6 Clicks" Mobility  Outcome Measure Help needed turning from your back to your side while in a flat bed without using bedrails?: None Help needed moving from lying on your back to sitting on the side of a flat bed without using bedrails?: None Help needed moving to and from a bed to a chair (including a wheelchair)?: A Little Help needed standing up from a chair using your arms (e.g., wheelchair or bedside chair)?: None Help needed to walk in hospital room?: None (with RW) Help needed climbing 3-5 steps with a railing? : A Little 6 Click Score: 22    End of Session   Activity Tolerance: Patient tolerated treatment well Patient left: in chair;with call bell/phone within reach Nurse Communication: Mobility status PT Visit Diagnosis: Muscle weakness (generalized) (M62.81);Unsteadiness on feet (R26.81)    Time: 1610-9604 PT Time Calculation (min) (ACUTE ONLY): 19 min   Charges:   PT Evaluation $PT Eval Low  Complexity: 1 Low          Aleda Grana, PT, DPT 05/12/23, 10:13 AM   Sandi Mariscal 05/12/2023, 10:11 AM

## 2023-05-12 NOTE — Progress Notes (Signed)
Progress Note    [name]   ZOX:[id number]  DOB: [birth year]  DOA: 05/10/2023     2 PCP: Nicole Racer, MD  Initial CC: uncontrolled pain  Hospital Course: [name] is a 62 yo female with PMH stage IV pancreatic adenocarcinoma (diagnosed May 2024), rheumatic heart disease s/p repair May 2023, pericardial effusion/tamponade s/p pericardiocentesis June 2023, PAF on Coumadin, CKD 3A, COPD, DM II, HTN who presented back to the ER with diffuse abdominal pain and right flank pain.  She also has been recently hospitalized 5/21 - 5/31 for similar uncontrolled pain.  She underwent celiac plexus nerve block and was discharged on MS Contin and oxycodone however pain regimen was not similar to inpatient regimen.   She was readmitted for further pain control and to rule out underlying UTI given right flank pain.  UA was notable for large LE, negative nitrite, greater than 50 WBC, many bacteria and she was therefore started on antibiotics on admission as well. Oncology and palliative care were consulted as well.  Interval History:  No events overnight. Pain much better controlled this morning and mood/spirits higher today.  She is amenable with reviewing a list of assisted living and deciding if she would be able to go.  Contingency plan would be discharging home with hospice and/or home health resources if available.  During assessment with PT, she has shown that she has the ability for more than she thinks, but effort (and possibly depression for good reason) was a big factor for her, especially at home.   Assessment and Plan:  Acute cystitis UA positive for pyuria, diffuse abdominal and right flank pain and tenderness with palpation. -Urine culture growing E. coli, sensitive to Rocephin - Continue Rocephin to complete course  Metastatic pancreatic cancer to liver Follows with oncology Dr. Mosetta [name] and with palliative care medicine Palliative care medicine consulted to assist with pain  control and GOC given readmission -Plan is for discharging to assisted living versus home with hospice in place  Type 2 diabetes with hyperglycemia Last hemoglobin A1c 8.7 on 04/04/2023 Heart healthy carb modified diet Start insulin sliding scale.   Diabetic polyneuropathy Resume home gabapentin   GERD Resume home PPI.   Paroxysmal A-fib on Coumadin Obtain INR Coumadin managed by pharmacy Continue home Coumadin   Chronic HFpEF 60 to 65% Last 2D echo done on 08/13/2022 revealed LVEF 60 to 65% - no s/s exac   Hyperlipidemia -DC statin given no further mortality benefit   Essential hypertension - continue lopressor   Hypovolemic hyponatremia -Due to poor intake - Continue encouraging diet   Chronic anxiety/depression Resume home regimen   Goals of care - see above    Old records reviewed in assessment of this patient  Antimicrobials: Rocephin 6/4 >> current   DVT prophylaxis:     Code Status:   Code Status: DNR  Mobility Assessment (last 72 hours)     Mobility Assessment     Row Name 05/12/23 1136 05/12/23 1010 05/12/23 0749 05/11/23 2027 05/11/23 0740   Does patient have an order for bedrest or is patient medically unstable -- -- No - Continue assessment No - Continue assessment No - Continue assessment   What is the highest level of mobility based on the progressive mobility assessment? Level 6 (Walks independently in room and hall) - Balance while walking in room without assist - Complete Level 6 (Walks independently in room and hall) - Balance while walking in room without assist - Complete  with RW Level  5 (Walks with assist in room/hall) - Balance while stepping forward/back and can walk in room with assist - Complete Level 5 (Walks with assist in room/hall) - Balance while stepping forward/back and can walk in room with assist - Complete Level 5 (Walks with assist in room/hall) - Balance while stepping forward/back and can walk in room with assist - Complete     Row Name 05/10/23 2234           Does patient have an order for bedrest or is patient medically unstable No - Continue assessment       What is the highest level of mobility based on the progressive mobility assessment? Level 5 (Walks with assist in room/hall) - Balance while stepping forward/back and can walk in room with assist - Complete                Barriers to discharge: none Disposition Plan:  ALF vs home with HH/hospice  Status is: Inpt  Objective: Blood pressure 104/81, pulse 91, temperature 97.8 F (36.6 C), temperature source Oral, resp. rate 16, height 5\' 11"  (1.803 m), weight 70.2 kg, last menstrual period 12/06/1976, SpO2 100 %.  Examination:  Physical Exam Constitutional:      Comments: Frail adult woman laying in bed mildly uncomfortable but NAD  HENT:     Head: Normocephalic and atraumatic.     Mouth/Throat:     Mouth: Mucous membranes are moist.  Eyes:     Extraocular Movements: Extraocular movements intact.  Cardiovascular:     Rate and Rhythm: Normal rate.  Pulmonary:     Effort: Pulmonary effort is normal. No respiratory distress.     Breath sounds: Normal breath sounds. No wheezing.  Abdominal:     General: Bowel sounds are normal. There is no distension.     Palpations: Abdomen is soft.     Tenderness: There is abdominal tenderness. There is no right CVA tenderness or left CVA tenderness.  Musculoskeletal:        General: Normal range of motion.     Cervical back: Normal range of motion and neck supple.  Skin:    General: Skin is warm and dry.  Neurological:     General: No focal deficit present.      Consultants:  Palliative care Oncology   Procedures:    Data Reviewed: Results for orders placed or performed during the hospital encounter of 05/10/23 (from the past 24 hour(s))  Glucose, capillary     Status: Abnormal   Collection Time: 05/11/23  9:46 PM  Result Value Ref Range   Glucose-Capillary 186 (H) 70 - 99 mg/dL    Comment 1 Notify RN    Comment 2 Document in Chart   CBC     Status: Abnormal   Collection Time: 05/12/23  4:06 AM  Result Value Ref Range   WBC 5.7 4.0 - 10.5 K/uL   RBC 3.29 (L) 3.87 - 5.11 MIL/uL   Hemoglobin 10.2 (L) 12.0 - 15.0 g/dL   HCT 40.9 (L) 81.1 - 91.4 %   MCV 92.7 80.0 - 100.0 fL   MCH 31.0 26.0 - 34.0 pg   MCHC 33.4 30.0 - 36.0 g/dL   RDW 78.2 95.6 - 21.3 %   Platelets 128 (L) 150 - 400 K/uL   nRBC 0.0 0.0 - 0.2 %  Protime-INR     Status: Abnormal   Collection Time: 05/12/23  4:06 AM  Result Value Ref Range   Prothrombin Time 15.6 (H) 11.4 -  15.2 seconds   INR 1.2 0.8 - 1.2  Glucose, capillary     Status: Abnormal   Collection Time: 05/12/23  7:19 AM  Result Value Ref Range   Glucose-Capillary 198 (H) 70 - 99 mg/dL  Glucose, capillary     Status: Abnormal   Collection Time: 05/12/23 11:47 AM  Result Value Ref Range   Glucose-Capillary 156 (H) 70 - 99 mg/dL    I have reviewed pertinent nursing notes, vitals, labs, and images as necessary. I have ordered labwork to follow up on as indicated.  I have reviewed the last notes from staff over past 24 hours. I have discussed patient's care plan and test results with nursing staff, CM/SW, and other staff as appropriate.  Time spent: Greater than 50% of the 55 minute visit was spent in counseling/coordination of care for the patient as laid out in the A&P.   LOS: 2 days   Lewie Chamber, MD [facility] 05/12/2023, 4:43 PM

## 2023-05-12 NOTE — Evaluation (Signed)
Occupational Therapy Evaluation Patient Details Name: [name] MRN: [id number] DOB: 1961/02/05 Today's Date: 05/12/2023   History of Present Illness Pt is a 62 y/o F admitted on 05/10/23 after presenting to the ED with c/o diffuse abdominal pain & R flank pain. Pt recently hospitalized 5/21-5/31 for similar uncontrolled pain & underwent celiac plexus nerve block. Pt has been readmitted for further pain control & is being treated for acute cystitis. PMH: stage IV pancreatic adenocarcinoma (diagnosed May 2024), rheumatic heart disease s/p repair May 2023, pericardial effusion/tamponade s/p pericardiocentesis June 2023, PAF on coumadin, CKD 3A, COPD, DM2, HTN   Clinical Impression   The pt presented with increased abdominal and back pain during the session (her nurse gave pain medication). She was able to perform assessed tasks without the need for assistance, including lower body dressing, sit to stand, ambulating in her room, and grooming in standing at the sink.  All questions and/or concerns were addressed pertaining to her self-care management and occupational performance in the home. She verbalized understanding. She does not require any additional OT services. OT will sign off.      Recommendations for follow up therapy are one component of a multi-disciplinary discharge planning process, led by the attending physician.  Recommendations may be updated based on patient status, additional functional criteria and insurance authorization.   Assistance Recommended at Discharge PRN  Patient can return home with the following Assistance with cooking/housework    Functional Status Assessment  Patient has not had a recent decline in their functional status  Equipment Recommendations  None recommended by OT       Precautions / Restrictions Precautions Precautions: Fall Restrictions Weight Bearing Restrictions: No      Mobility Bed Mobility        General bed mobility comments: Pt  was received seated in bedside chair    Transfers Overall transfer level: Independent Equipment used: None Transfers: Sit to/from Stand        Balance Overall balance assessment: Mild deficits observed, not formally tested            ADL either performed or assessed with clinical judgement   ADL Overall ADL's : At baseline;Independent               Vision   Additional Comments: She correctly read the time depicted on the wall clock.            Pertinent Vitals/Pain Pain Assessment Pain Assessment: 0-10 Pain Score: 8  Pain Location:  (abdominal and back) Pain Intervention(s): Patient requesting pain meds-RN notified, RN gave pain meds during session     Hand Dominance Right   Extremity/Trunk Assessment Upper Extremity Assessment Upper Extremity Assessment: Overall WFL for tasks assessed   Lower Extremity Assessment Lower Extremity Assessment: Overall WFL for tasks assessed      Communication Communication Communication: No difficulties   Cognition Arousal/Alertness: Awake/alert Behavior During Therapy: WFL for tasks assessed/performed Overall Cognitive Status: Within Functional Limits for tasks assessed        General Comments:  (Oriented x4, cooperative, able to follow commands without difficulty)     General Comments  Pt toileted without assistance.            Home Living Family/patient expects to be discharged to:: Private residence Living Arrangements: Alone Available Help at Discharge: Available PRN/intermittently Type of Home: Apartment Home Access: Level entry     Home Layout: One level     Bathroom Shower/Tub: Tub/shower unit  Home Equipment: Rollator (4 wheels);Cane - single point;BSC/3in1;Wheelchair - manual;Grab bars - tub/shower;Shower seat          Prior Functioning/Environment Prior Level of Function : Independent/Modified Independent;Driving             Mobility Comments: Pt reports she is  independent without AD but will use her w/c or rollator as needed when she has felt dizzy. Pt notes 2 falls in the past month 2/2 lightheadedness & dizziness. Pt reports she stays in bed the majority of the day because she doesn't feel like getting OOB. ADLs Comments:  (She reports being modified independent to independent w/ ADLs, however admits she opts to perform minimal activity at home, due to disinterest, as well as fear that her symptoms (occasional dizziness/lighheadedness & abd pain) will increase w/ activity.)        OT Problem List:  Pain      OT Treatment/Interventions:   No further treatment  needs identified       OT Frequency:  N/A       AM-PAC OT "6 Clicks" Daily Activity     Outcome Measure Help from another person eating meals?: None Help from another person taking care of personal grooming?: None Help from another person toileting, which includes using toliet, bedpan, or urinal?: None Help from another person bathing (including washing, rinsing, drying)?: None Help from another person to put on and taking off regular upper body clothing?: None Help from another person to put on and taking off regular lower body clothing?: None 6 Click Score: 24   End of Session Equipment Utilized During Treatment: Other (comment) (none) Nurse Communication: Patient requests pain meds;Mobility status  Activity Tolerance: Patient limited by pain Patient left: in chair;with call bell/phone within reach;with nursing/sitter in room  OT Visit Diagnosis: Pain Pain - part of body:  (abdomen and back)                Time: 1610-9604 OT Time Calculation (min): 20 min Charges:  OT General Charges $OT Visit: 1 Visit OT Evaluation $OT Eval Low Complexity: 1 Low    Amon Costilla L Mumtaz Lovins, OTR/L 05/12/2023, 1:29 PM

## 2023-05-13 ENCOUNTER — Other Ambulatory Visit (HOSPITAL_COMMUNITY): Payer: Self-pay

## 2023-05-13 DIAGNOSIS — N3000 Acute cystitis without hematuria: Secondary | ICD-10-CM | POA: Diagnosis not present

## 2023-05-13 DIAGNOSIS — C259 Malignant neoplasm of pancreas, unspecified: Secondary | ICD-10-CM | POA: Diagnosis not present

## 2023-05-13 DIAGNOSIS — G893 Neoplasm related pain (acute) (chronic): Secondary | ICD-10-CM | POA: Diagnosis not present

## 2023-05-13 DIAGNOSIS — C787 Secondary malignant neoplasm of liver and intrahepatic bile duct: Secondary | ICD-10-CM

## 2023-05-13 DIAGNOSIS — Z515 Encounter for palliative care: Secondary | ICD-10-CM

## 2023-05-13 LAB — CBC
HCT: 30.5 % — ABNORMAL LOW (ref 36.0–46.0)
Hemoglobin: 10.2 g/dL — ABNORMAL LOW (ref 12.0–15.0)
MCH: 31.3 pg (ref 26.0–34.0)
MCHC: 33.4 g/dL (ref 30.0–36.0)
MCV: 93.6 fL (ref 80.0–100.0)
Platelets: 131 10*3/uL — ABNORMAL LOW (ref 150–400)
RBC: 3.26 MIL/uL — ABNORMAL LOW (ref 3.87–5.11)
RDW: 13.9 % (ref 11.5–15.5)
WBC: 5.5 10*3/uL (ref 4.0–10.5)
nRBC: 0 % (ref 0.0–0.2)

## 2023-05-13 LAB — CULTURE, BLOOD (ROUTINE X 2)

## 2023-05-13 LAB — PROTIME-INR
INR: 1.4 — ABNORMAL HIGH (ref 0.8–1.2)
Prothrombin Time: 17.8 seconds — ABNORMAL HIGH (ref 11.4–15.2)

## 2023-05-13 LAB — GLUCOSE, CAPILLARY
Glucose-Capillary: 238 mg/dL — ABNORMAL HIGH (ref 70–99)
Glucose-Capillary: 254 mg/dL — ABNORMAL HIGH (ref 70–99)

## 2023-05-13 MED ORDER — XTAMPZA ER 36 MG PO C12A
72.0000 mg | EXTENDED_RELEASE_CAPSULE | Freq: Three times a day (TID) | ORAL | 0 refills | Status: DC
Start: 2023-05-13 — End: 2023-05-13

## 2023-05-13 MED ORDER — HEPARIN SOD (PORK) LOCK FLUSH 100 UNIT/ML IV SOLN
500.0000 [IU] | INTRAVENOUS | Status: AC | PRN
  Administered 2023-05-13: 500 [IU]

## 2023-05-13 MED ORDER — OXYCODONE HCL 10 MG PO TABS: 10.0000 mg | ORAL_TABLET | ORAL | 0 refills | Status: DC | PRN

## 2023-05-13 MED ORDER — WARFARIN SODIUM 6 MG PO TABS
6.0000 mg | ORAL_TABLET | Freq: Once | ORAL | Status: DC
  Filled 2023-05-13: qty 1

## 2023-05-13 MED ORDER — METOPROLOL TARTRATE 25 MG PO TABS
12.5000 mg | ORAL_TABLET | Freq: Two times a day (BID) | ORAL | 3 refills | Status: DC
  Filled 2023-05-13: qty 60, 60d supply, fill #0

## 2023-05-13 MED ORDER — LACTULOSE 10 GM/15ML PO SOLN
30.0000 g | Freq: Every day | ORAL | Status: DC
  Administered 2023-05-13: 30 g via ORAL
  Filled 2023-05-13: qty 45

## 2023-05-13 MED ORDER — BISACODYL 10 MG RE SUPP
10.0000 mg | Freq: Once | RECTAL | Status: AC
  Administered 2023-05-13: 10 mg via RECTAL
  Filled 2023-05-13: qty 1

## 2023-05-13 MED ORDER — XTAMPZA ER 36 MG PO C12A
72.0000 mg | EXTENDED_RELEASE_CAPSULE | Freq: Three times a day (TID) | ORAL | 0 refills | Status: DC
Start: 2023-05-13 — End: 2023-05-14
  Filled 2023-05-13: qty 90, 15d supply, fill #0

## 2023-05-13 MED ORDER — OXYCODONE HCL ER 20 MG PO T12A: 80.0000 mg | EXTENDED_RELEASE_TABLET | Freq: Three times a day (TID) | ORAL | Status: DC

## 2023-05-13 MED ORDER — METOPROLOL TARTRATE 25 MG PO TABS: 12.5000 mg | ORAL_TABLET | Freq: Two times a day (BID) | ORAL | 3 refills | Status: DC

## 2023-05-13 MED ORDER — OXYCODONE HCL 10 MG PO TABS
10.0000 mg | ORAL_TABLET | ORAL | 0 refills | Status: DC | PRN
  Filled 2023-05-13: qty 60, 5d supply, fill #0

## 2023-05-13 MED ORDER — DIPHENHYDRAMINE HCL 25 MG PO CAPS
25.0000 mg | ORAL_CAPSULE | Freq: Once | ORAL | Status: AC
  Administered 2023-05-13: 25 mg via ORAL
  Filled 2023-05-13: qty 1

## 2023-05-13 NOTE — Progress Notes (Signed)
Daily Progress Note   Patient Name: [name]       Date: 05/13/2023 DOB: [birth year]  Age: 62 y.o. MRN#: [id number] Attending Physician: Nicole Chamber, MD Primary Care Physician: Nicole Racer, MD Admit Date: 05/10/2023 Length of Stay: 3 days  Reason for Consultation/Follow-up: Establishing goals of care and symptom management  Subjective:   CC:  Patient notes feelings of constipation today. Following up regarding complex medical decision making and symptom management.  Subjective: Viewed EMR prior to presenting to bedside.  At time of EMR review patient had received IV Dilaudid 0.5 mg x 5 doses and oxycodone 20 mg x 5 doses.  Continues to receive OxyContin 40 mg every 8 hours.  Presented to bedside to check on patient this a.m.  Patient seen sitting up in bedside chair dressed with make-up on.  Patient overall feeling better though notes feelings of constipation today.  Discussed would provide suppository today as do not want her constipation to worsen her pain which it has previously.  Patient agreeing with this intervention. Also discussed changing patient's OxyContin to 80 mg every 8 hours based on her OME requirements over the past 24 hours from as needed medications.  Patient agreeing with this change. Patient called her sister and put her on the phone so I could update her on plan as well.  Sister discussed that plan is for patient to return home with hospice support.  Patient may be discharged as soon as today. Spent time answering questions as able.  Provided emotional support via active listening.  Thank patient and sister for allowing me to meet with her today.  Review of Systems Pain improved Objective:   Vital Signs:  BP (!) 117/96 (BP Location: Left Arm)   Pulse (!) 117   Temp 98.1 F (36.7 C) (Oral)   Resp 18   Ht 5\' 11"  (1.803 m)   Wt 70.2 kg   LMP 12/06/1976   SpO2 100%   BMI 21.59 kg/m   Physical Exam: General: NAD, alert, sitting in bedside chair,  chronically ill-appearing, cachectic Eyes: No drainage noted HENT: moist mucous membranes Cardiovascular: RRR Respiratory: no increased work of breathing noted, not in respiratory distress Abdomen: distended Skin: Multiple ecchymoses on bilateral lower extremities Neuro: A&Ox4, following commands easily Psych: appropriately answers all questions  Imaging:  I personally reviewed recent imaging.   Assessment & Plan:   Assessment: Patient is a 62 year old female with a past medical history of rheumatic heart disease status post MV and TV repair in 04/2022, history of pericardial effusion/tamponade status post pericardiocentesis in 05/2022, PAF on Coumadin, CKD, COPD, type 2 diabetes, hypertension, tobacco use, and recent diagnosis of 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  Since admission, leaving management for complicated UTI and right pyelonephritis.  Of note patient had recently been admitted from 5/21 - 5/31 for pain management.  Patient has been unable to follow-up with outpatient providers including oncologist.  Palliative medicine team consulted to assist with complex medical decision making and symptom management. Of note patient known to this provider from recent hospitalization.  Recommendations/Plan: # Complex medical decision making/goals of care:  - Patient previously voiced "peace" knowing that that she is dying from cancer and wants to focus on symptom management at the end of her life.  Patient planning to return home with hospice.  -Had attempted placement though PT noted patient only needed home health so did not  qualify for assistance. Not going to pay for out of pocket placement. Realize this may lead to future hospitalizations as patient was unable to care for herself at home. Hoping home with hospice will prevent recurrent admissions.                 -  Code Status: DNR   Prognosis: < 6 months   # Symptom management                -Pain, acute in setting of metastatic pancreatic cancer                               -Increase OxyContin to 80mg  q8hrs scheduled during the day. Discussed with hospitalist can transition to [facility] if that will be covered with patient's insurance. Patient did not tolerate MS Contin previously.    -Continue IV Dilaudid to 0.5 mg every hour as needed                               -Change oxycodone to 10-20 mg every 4 hours as needed                               -Continue gabapentin 300 mg 3 times daily                               -Received celiac plexus block on 5/29   # Psycho-social/Spiritual Support:  - Support System: sister, has developmentally disabled 45yo son in group home   # Discharge Planning:  Home with hospice  Discussed with: patient, patient's sister, hosptialist, TOC  Thank you for allowing the palliative care team to participate in the care [name].  Nicole Morin, DO Palliative Care Provider PMT # [phone]  If patient remains symptomatic despite maximum doses, please call PMT at [phone] between 0700 and 1900. Outside of these hours, please call attending, as PMT does not have night coverage.  *Please note that this is a verbal dictation therefore any spelling or grammatical errors are due to the "[employer]" system interpretation.

## 2023-05-13 NOTE — Progress Notes (Signed)
   05/13/23 0837  Assess: MEWS Score  Temp 98.1 F (36.7 C)  BP (!) 117/96  MAP (mmHg) 109  Pulse Rate (!) 117  Resp 18  Level of Consciousness Alert  SpO2 100 %  O2 Device Room Air  Patient Activity (if Appropriate) In chair  Assess: MEWS Score  MEWS Temp 0  MEWS Systolic 0  MEWS Pulse 2  MEWS RR 0  MEWS LOC 0  MEWS Score 2  MEWS Score Color Yellow  Assess: if the MEWS score is Yellow or Red  Were vital signs taken at a resting state? Yes  Focused Assessment No change from prior assessment  Does the patient meet 2 or more of the SIRS criteria? No  MEWS guidelines implemented  Yes, yellow  Treat  MEWS Interventions Considered administering scheduled or prn medications/treatments as ordered  Take Vital Signs  Increase Vital Sign Frequency  Yellow: Q2hr x1, continue Q4hrs until patient remains green for 12hrs  Escalate  MEWS: Escalate Yellow: Discuss with charge nurse and consider notifying provider and/or RRT  Notify: Charge Nurse/RN  Name of Charge Nurse/RN Notified Ramond Craver CN  Provider Notification  Provider Name/Title Girguis MD  Date Provider Notified 05/13/23  Time Provider Notified [phone]  Method of Notification Page (Secure chat)  Notification Reason Other (Comment)  Provider response No new orders  Assess: SIRS CRITERIA  SIRS Temperature  0  SIRS Pulse 1  SIRS Respirations  0  SIRS WBC 0  SIRS Score Sum  1

## 2023-05-13 NOTE — Progress Notes (Signed)
Mobility Specialist - Progress Note   05/13/23 0956  Mobility  Activity Ambulated with assistance in hallway  Level of Assistance Standby assist, set-up cues, supervision of patient - no hands on  Assistive Device Front wheel walker  Distance Ambulated (ft) 200 ft  Range of Motion/Exercises Active  Activity Response Tolerated well  Mobility Referral Yes  $Mobility charge 1 Mobility  Mobility Specialist Start Time (ACUTE ONLY) 0945  Mobility Specialist Stop Time (ACUTE ONLY) 0954  Mobility Specialist Time Calculation (min) (ACUTE ONLY) 9 min   Pt was found on recliner chair and agreeable to ambulate. Needs cues for foot placement when turning. No complaints during session. At EOS returned to recliner chair with all needs met and call bell in reach.  Billey Chang Mobility Specialist

## 2023-05-13 NOTE — Care Management Important Message (Signed)
Important Message  Patient Details IM Letter given. Name: [name] MRN: [id number] Date of Birth: November 03, 1961   Medicare Important Message Given:  Yes     Caren Macadam 05/13/2023, 11:17 AM

## 2023-05-13 NOTE — Progress Notes (Addendum)
ANTICOAGULATION CONSULT NOTE  Pharmacy Consult for Warfarin Indication: atrial fibrillation  Allergies  Allergen Reactions   Norco [Hydrocodone-Acetaminophen] Itching and Other (See Comments)    Tolerates Oxycodone    Patient Measurements: Height: 5\' 11"  (180.3 cm) Weight: 70.2 kg (154 lb 12.2 oz) IBW/kg (Calculated) : 70.8  Vital Signs: Temp: 98.1 F (36.7 C) (06/07 0837) Temp Source: Oral (06/07 0837) BP: 117/96 (06/07 0837) Pulse Rate: 117 (06/07 0837)  Labs: Recent Labs    05/10/23 1754 05/10/23 1811 05/10/23 1818 05/11/23 0445 05/12/23 0406 05/13/23 0334  HGB 13.0  --  13.3 11.3* 10.2* 10.2*  HCT 38.3  --  39.0 33.9* 30.5* 30.5*  PLT 169  --   --  141* 128* 131*  LABPROT  --  13.5  --   --  15.6* 17.8*  INR  --  1.0  --   --  1.2 1.4*  CREATININE 0.89  --  0.70 0.64  --   --      Estimated Creatinine Clearance: 79.8 mL/min (by C-G formula based on SCr of 0.64 mg/dL).  Assessment: [name] is a 62 y.o. female with PMH Afib on warfarin PTA, newly diagnosed metastasized pancreatic adenocarcinoma, presenting with abdominal pain and right flank pain, and found to have complicated UTI with R-sided pyelonephritis. Recently admitted 5/21 - 5/31, during which time warfarin was reversed for liver lesion biopsy and port placement.   Baseline INR subtherapeutic (likely multiple missed doses) Prior anticoagulation: warfarin 5 mg daily, except 2.5 mg on Sundays Last dose unknown (patient is able to state did not take on 6/4 prior to admission)  Significant events:  Today, 05/13/2023: INR= 1.4, remains subtherapeutic, but on upward trend (after resuming home dose on 6/5 and increased dose on 6/6) Hgb= 10.2, low but stable Plt= 131, low but relatively stable Major drug interactions: none noted Broad spectrum antibiotics may increase warfarin sensitivity No bleeding issues per nursing Eating 25%-50% of meals (incomplete charting)  Goal of Therapy: INR  2-3 Monitor platelets by anticoagulation protocol: Yes   Plan: Warfarin 6 mg PO tonight at 16:00, slightly above home dose  - use caution with boosted doses, given multiple risk factors for increased warfarin sensitivity (acute illness, antibiotics, reduced PO intake) Daily INR CBC at least q72 hr while on warfarin Monitor for signs of bleeding or thrombosis   Tacy Learn, PharmD Clinical Pharmacist 05/13/2023 11:15 AM

## 2023-05-13 NOTE — Plan of Care (Signed)
  Problem: Coping: Goal: Ability to adjust to condition or change in health will improve Outcome: Progressing   Problem: Fluid Volume: Goal: Ability to maintain a balanced intake and output will improve Outcome: Progressing   Problem: Clinical Measurements: Goal: Ability to maintain clinical measurements within normal limits will improve Outcome: Progressing

## 2023-05-13 NOTE — Discharge Summary (Signed)
Physician Discharge Summary   [name] WJX:[id number] DOB: 01/30/1961 DOA: 05/10/2023  PCP: Chrys Racer, MD  Admit date: 05/10/2023 Discharge date: 05/13/2023   Admitted From: Home Disposition:  Home Discharging physician: Lewie Chamber, MD Barriers to discharge: none  Recommendations at discharge: Continue hospice level care. Prognosis poor. May eventually need residential hospice placement with expected decline at home  Home Health: PT, RN, aide  Discharge Condition: stable CODE STATUS: DNR Diet recommendation:  Diet Orders (From admission, onward)     Start     Ordered   05/13/23 0000  Diet general        05/13/23 1102   05/10/23 2101  Diet heart healthy/carb modified Room service appropriate? Yes; Fluid consistency: Thin  Diet effective now       Question Answer Comment  Diet-HS Snack? Nothing   Room service appropriate? Yes   Fluid consistency: Thin      05/10/23 2101            Hospital Course: 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  She also has been recently hospitalized 5/21 - 5/31 for similar uncontrolled pain.  She underwent celiac plexus nerve block and was discharged on MS Contin and oxycodone however pain regimen was not similar to inpatient regimen.   She was readmitted for further pain control and to rule out underlying UTI given right flank pain.  UA was notable for large LE, negative nitrite, greater than 50 WBC, many bacteria and she was therefore started on antibiotics on admission as well. Oncology and palliative care were consulted as well. Pain regimen was adjusted during hospitalization and better pain control was achieved.  She was treated for a UTI as  well and completed abx inpatient.  After GOC discussions, she was recommended for transitioning to hospice at discharge. She was evaluated by physical therapy as well during hospitalization and not felt to warrant needing SNF.  Patient was unable to afford assisted or independent living and therefore she was arranged for home health aide and RN at discharge as well as establishing care with hospice.  Given her poor prognosis and expected physical decline, she may warrant further hospitalization and/or placement with residential hospice once she is no longer able to live alone as she has no reliable care from family/friends.  Assessment and Plan:  Acute cystitis UA positive for pyuria, diffuse abdominal and right flank pain and tenderness with palpation. -Urine culture growing E. coli, sensitive to Rocephin; 3-day course completed during hospitalization  Metastatic pancreatic cancer to liver Follows with oncology Dr. Mosetta Putt and with palliative care medicine Palliative care medicine consulted to assist with pain control and GOC given readmission -Plan is for discharging to home with hospice in place - may soon eventually need residential hospice with further decline    Type 2 diabetes with hyperglycemia Last hemoglobin A1c 8.7 on 04/04/2023 -Home regimen resumed at discharge   Diabetic polyneuropathy Resume home gabapentin   GERD Resume home PPI.   Paroxysmal A-fib on Coumadin Obtain INR Coumadin managed by pharmacy Continue home Coumadin   Chronic HFpEF 60 to 65% Last 2D echo done on 08/13/2022 revealed LVEF 60 to 65% - no s/s exac   Hyperlipidemia -DC statin given no further mortality benefit  Essential hypertension - continue lopressor   Hypovolemic hyponatremia -Due to poor intake - Continue encouraging diet   Chronic anxiety/depression Resume home regimen   Goals of care - see above     Principal Diagnosis: Cancer related pain  Discharge Diagnoses: Active  Hospital Problems   Diagnosis Date Noted   Cancer related pain 05/11/2023    Priority: 1.   Pancreatic cancer (HCC) 04/12/2023    Priority: 1.   Acute cystitis 06/19/2010    Priority: 2.   COPD (chronic obstructive pulmonary disease) (HCC) 08/05/2022    Priority: 3.   Paroxysmal atrial fibrillation (HCC) 12/30/2016    Priority: 3.   S/P MVR/TVR  04/19/22 05/17/2022    Priority: 6.   Type 2 diabetes mellitus with hyperlipidemia (HCC) 02/27/2007    Priority: 6.   Anxiety and depression 08/05/2022    Priority: 7.   Normocytic anemia 07/23/2022    Priority: 9.   Palliative care patient 05/13/2023   DNR (do not resuscitate) 05/11/2023   PAF (paroxysmal atrial fibrillation) (HCC)    GAD (generalized anxiety disorder) 11/11/2015   Hypertension associated with diabetes (HCC) 02/27/2007    Resolved Hospital Problems  No resolved problems to display.     Discharge Instructions     Diet general   Complete by: As directed    Increase activity slowly   Complete by: As directed       Allergies as of 05/13/2023       Reactions   Norco [hydrocodone-acetaminophen] Itching, Other (See Comments)   Tolerates Oxycodone        Medication List     STOP taking these medications    atorvastatin 20 MG tablet Commonly known as: LIPITOR   dexamethasone 4 MG tablet Commonly known as: DECADRON   Farxiga 10 MG Tabs tablet Generic drug: dapagliflozin propanediol   furosemide 40 MG tablet Commonly known as: LASIX   Tiadylt ER 300 MG 24 hr capsule Generic drug: diltiazem       TAKE these medications    ARIPiprazole 5 MG tablet Commonly known as: ABILIFY Take 2.5 mg by mouth daily.   Blood Glucose Monitoring Suppl Devi 1 each by Does not apply route in the morning, at noon, and at bedtime. May substitute to any manufacturer covered by patient's insurance.   BLOOD GLUCOSE TEST STRIPS Strp 1 each by In Vitro route in the morning, at noon, and at bedtime. May substitute to any  manufacturer covered by patient's insurance.   buPROPion 300 MG 24 hr tablet Commonly known as: WELLBUTRIN XL Take 300 mg by mouth in the morning.   dexlansoprazole 60 MG capsule Commonly known as: DEXILANT Take 60 mg by mouth daily.   gabapentin 300 MG capsule Commonly known as: NEURONTIN Take 300 mg by mouth 3 (three) times daily.   Geri-Lanta 200-200-20 MG/5ML suspension Generic drug: alum & mag hydroxide-simeth Take 30 mLs by mouth every 4 (four) hours as needed for indigestion.   hydrALAZINE 25 MG tablet Commonly known as: APRESOLINE Take 1 tablet (25 mg total) by mouth daily as needed (BP>150).   insulin glargine 100 UNIT/ML Solostar Pen Commonly known as: LANTUS Inject 15 Units into the skin daily.   Insulin Pen Needle 31G X 5 MM Misc Lantus 15 units daily   linagliptin 5 MG Tabs tablet Commonly known as: TRADJENTA Take 5 mg by mouth daily.   metoprolol tartrate 25 MG tablet Commonly known as: LOPRESSOR Take 0.5 tablets (12.5 mg total) by mouth 2 (two) times  daily. What changed:  medication strength how much to take   naloxone 4 MG/0.1ML Liqd nasal spray kit Commonly known as: NARCAN PRN for suspected opiate OD   Oxycodone HCl 10 MG Tabs Take 1-2 tablets (10-20 mg total) by mouth every 4 (four) hours as needed. What changed:  medication strength how much to take when to take this reasons to take this   oyster calcium 500 MG Tabs tablet Take 500 mg of elemental calcium by mouth daily.   polyethylene glycol powder 17 GM/SCOOP powder Commonly known as: GLYCOLAX/MIRALAX Take 17 g by mouth daily.   Premarin 0.45 MG tablet Generic drug: estrogens (conjugated) Take 0.45 mg by mouth daily.   QUEtiapine 25 MG tablet Commonly known as: SEROQUEL Take 25 mg by mouth as needed.   senna 8.6 MG Tabs tablet Commonly known as: SENOKOT Take 2 tablets (17.2 mg total) by mouth daily.   tobramycin-dexamethasone ophthalmic solution Commonly known as:  TOBRADEX 1 drop 4 (four) times daily.   traZODone 100 MG tablet Commonly known as: DESYREL Take 200 mg by mouth at bedtime. Pt takes 2 tablets 200 mg at bedtime.   Trintellix 10 MG Tabs tablet Generic drug: vortioxetine HBr Take 10 mg by mouth daily.   Voltaren 1 % Gel Generic drug: diclofenac Sodium Apply 2 g topically 4 (four) times daily as needed (pain).   warfarin 5 MG tablet Commonly known as: COUMADIN Take as directed. If you are unsure how to take this medication, talk to your nurse or doctor. Original instructions: Take 5 mg by mouth as directed. Take 5mg  po daily, except 2.5mg  on Sunday.   Xtampza ER 36 MG C12a Generic drug: oxyCODONE ER Take 2 capsules (72 mg total) by mouth every 8 (eight) hours. What changed: how much to take               Durable Medical Equipment  (From admission, onward)           Start     Ordered   05/12/23 1016  For home use only DME Walker rolling  Once       Question Answer Comment  Walker: With 5 Inch Wheels   Patient needs a walker to treat with the following condition General weakness      06 /06/24 1015            Follow-up Information     [facility] Follow up.   Specialty: Home Health Services Contact information: [address] [place] [address] [phone]                Allergies  Allergen Reactions   Norco [Hydrocodone-Acetaminophen] Itching and Other (See Comments)    Tolerates Oxycodone    Consultations: Palliative care Oncology  Procedures:   Discharge Exam: BP (!) 124/104 (BP Location: Right Arm)   Pulse (!) 129   Temp 97.8 F (36.6 C) (Oral)   Resp 17   Ht 5\' 11"  (1.803 m)   Wt 70.2 kg   LMP 12/06/1976   SpO2 100%   BMI 21.59 kg/m  Physical Exam Constitutional:      Comments: Frail adult woman sitting in recliner now appearing much more comfortable compared to prior and in no distress  HENT:     Head: Normocephalic and atraumatic.      Mouth/Throat:     Mouth: Mucous membranes are moist.  Eyes:     Extraocular Movements: Extraocular movements intact.  Cardiovascular:     Rate  and Rhythm: Normal rate.  Pulmonary:     Effort: Pulmonary effort is normal. No respiratory distress.     Breath sounds: Normal breath sounds. No wheezing.  Abdominal:     General: Bowel sounds are normal. There is no distension.     Palpations: Abdomen is soft.     Tenderness: There is no abdominal tenderness. There is no right CVA tenderness or left CVA tenderness.  Musculoskeletal:        General: Normal range of motion.     Cervical back: Normal range of motion and neck supple.  Skin:    General: Skin is warm and dry.  Neurological:     General: No focal deficit present.      The results of significant diagnostics from this hospitalization (including imaging, microbiology, ancillary and laboratory) are listed below for reference.   Microbiology: Recent Results (from the past 240 hour(s))  Urine Culture (for pregnant, neutropenic or urologic patients or patients with an indwelling urinary catheter)     Status: Abnormal   Collection Time: 05/10/23  5:25 PM   Specimen: Urine, Clean Catch  Result Value Ref Range Status   Specimen Description   Final    URINE, CLEAN CATCH Performed at [facility], [address], [place], [place] [address]    Special Requests   Final    NONE Performed at [facility], [address] [place], [place] [address]    Culture >=100,000 COLONIES/mL ESCHERICHIA COLI (A)  Final   Report Status 05/12/2023 FINAL  Final   Organism ID, Bacteria ESCHERICHIA COLI (A)  Final      Susceptibility   Escherichia coli - MIC*    AMPICILLIN >=32 RESISTANT Resistant     CEFAZOLIN <=4 SENSITIVE Sensitive     CEFEPIME <=0.12 SENSITIVE Sensitive     CEFTRIAXONE <=0.25 SENSITIVE Sensitive     CIPROFLOXACIN >=4 RESISTANT Resistant     GENTAMICIN <=1 SENSITIVE Sensitive     IMIPENEM  <=0.25 SENSITIVE Sensitive     NITROFURANTOIN <=16 SENSITIVE Sensitive     TRIMETH/SULFA >=320 RESISTANT Resistant     AMPICILLIN/SULBACTAM 16 INTERMEDIATE Intermediate     PIP/TAZO <=4 SENSITIVE Sensitive     * >=100,000 COLONIES/mL ESCHERICHIA COLI  Blood culture (routine x 2)     Status: None (Preliminary result)   Collection Time: 05/10/23  9:10 PM   Specimen: BLOOD  Result Value Ref Range Status   Specimen Description   Final    BLOOD BLOOD RIGHT ARM Performed at [facility], [address] [place], [place] [address]    Special Requests   Final    BOTTLES DRAWN AEROBIC AND ANAEROBIC Blood Culture results may not be optimal due to an inadequate volume of blood received in culture bottles Performed at [facility], [address], [place], [place] [address]    Culture   Final    NO GROWTH 3 DAYS Performed at [facility], [address] [place], [place] [address]    Report Status PENDING  Incomplete  Blood culture (routine x 2)     Status: None (Preliminary result)   Collection Time: 05/11/23  4:46 AM   Specimen: BLOOD  Result Value Ref Range Status   Specimen Description   Final    BLOOD BLOOD RIGHT ARM AEROBIC BOTTLE ONLY Performed at [facility], [address] [place], [place] [address]    Special Requests   Final  BOTTLES DRAWN AEROBIC ONLY Blood Culture adequate volume Performed at [facility], [address], [place], [place] [address]    Culture   Final    NO GROWTH 2 DAYS Performed at [facility], [address], [place], [place] [address]    Report Status PENDING  Incomplete     Labs: BNP (last 3 results) Recent Labs    08/10/22 0231 08/12/22 1352 08/12/22 1829  BNP 713.2* 555.6* 497.2*   Basic Metabolic Panel: Recent Labs  Lab 05/10/23 1754 05/10/23 1818 05/11/23 0445  NA 131* 133* 131*  K 4.7 4.4 3.8  CL 98 97* 101  CO2 22  --  21*  GLUCOSE 288*  277* 201*  BUN 25* 27* 17  CREATININE 0.89 0.70 0.64  CALCIUM 8.5*  --  7.9*  MG  --   --  1.6*  PHOS  --   --  2.7   Liver Function Tests: Recent Labs  Lab 05/10/23 1754 05/11/23 0445  AST 28 25  ALT 32 29  ALKPHOS 126 108  BILITOT 0.9 0.8  PROT 6.8 6.0*  ALBUMIN 3.8 3.3*   Recent Labs  Lab 05/10/23 1754  LIPASE 52*   No results for input(s): "AMMONIA" in the last 168 hours. CBC: Recent Labs  Lab 05/10/23 1754 05/10/23 1818 05/11/23 0445 05/12/23 0406 05/13/23 0334  WBC 7.2  --  6.6 5.7 5.5  NEUTROABS 4.7  --   --   --   --   HGB 13.0 13.3 11.3* 10.2* 10.2*  HCT 38.3 39.0 33.9* 30.5* 30.5*  MCV 91.6  --  92.1 92.7 93.6  PLT 169  --  141* 128* 131*   Cardiac Enzymes: No results for input(s): "CKTOTAL", "CKMB", "CKMBINDEX", "TROPONINI" in the last 168 hours. BNP: Invalid input(s): "POCBNP" CBG: Recent Labs  Lab 05/12/23 1147 05/12/23 1715 05/12/23 2304 05/13/23 0718 05/13/23 1147  GLUCAP 156* 195* 190* 238* 254*   D-Dimer No results for input(s): "DDIMER" in the last 72 hours. Hgb A1c No results for input(s): "HGBA1C" in the last 72 hours. Lipid Profile No results for input(s): "CHOL", "HDL", "LDLCALC", "TRIG", "CHOLHDL", "LDLDIRECT" in the last 72 hours. Thyroid function studies No results for input(s): "TSH", "T4TOTAL", "T3FREE", "THYROIDAB" in the last 72 hours.  Invalid input(s): "FREET3" Anemia work up No results for input(s): "VITAMINB12", "FOLATE", "FERRITIN", "TIBC", "IRON", "RETICCTPCT" in the last 72 hours. Urinalysis    Component Value Date/Time   COLORURINE YELLOW 05/10/2023 1725   APPEARANCEUR CLOUDY (A) 05/10/2023 1725   LABSPEC 1.035 (H) 05/10/2023 1725   PHURINE 5.0 05/10/2023 1725   GLUCOSEU >=500 (A) 05/10/2023 1725   GLUCOSEU NEG mg/dL 09/81/1914 7829   HGBUR NEGATIVE 05/10/2023 1725   HGBUR negative 02/27/2007 1327   BILIRUBINUR NEGATIVE 05/10/2023 1725   KETONESUR NEGATIVE 05/10/2023 1725   PROTEINUR 30 (A) 05/10/2023  1725   UROBILINOGEN 1.0 06/10/2010 1729   NITRITE NEGATIVE 05/10/2023 1725   LEUKOCYTESUR LARGE (A) 05/10/2023 1725   Sepsis Labs Recent Labs  Lab 05/10/23 1754 05/11/23 0445 05/12/23 0406 05/13/23 0334  WBC 7.2 6.6 5.7 5.5   Microbiology Recent Results (from the past 240 hour(s))  Urine Culture (for pregnant, neutropenic or urologic patients or patients with an indwelling urinary catheter)     Status: Abnormal   Collection Time: 05/10/23  5:25 PM   Specimen: Urine, Clean Catch  Result Value Ref Range Status   Specimen Description   Final    URINE, CLEAN CATCH Performed at  [facility], [address], [place], [place] [address]    Special Requests   Final    NONE Performed at [facility], [address], [place], [place] [address]    Culture >=100,000 COLONIES/mL ESCHERICHIA COLI (A)  Final   Report Status 05/12/2023 FINAL  Final   Organism ID, Bacteria ESCHERICHIA COLI (A)  Final      Susceptibility   Escherichia coli - MIC*    AMPICILLIN >=32 RESISTANT Resistant     CEFAZOLIN <=4 SENSITIVE Sensitive     CEFEPIME <=0.12 SENSITIVE Sensitive     CEFTRIAXONE <=0.25 SENSITIVE Sensitive     CIPROFLOXACIN >=4 RESISTANT Resistant     GENTAMICIN <=1 SENSITIVE Sensitive     IMIPENEM <=0.25 SENSITIVE Sensitive     NITROFURANTOIN <=16 SENSITIVE Sensitive     TRIMETH/SULFA >=320 RESISTANT Resistant     AMPICILLIN/SULBACTAM 16 INTERMEDIATE Intermediate     PIP/TAZO <=4 SENSITIVE Sensitive     * >=100,000 COLONIES/mL ESCHERICHIA COLI  Blood culture (routine x 2)     Status: None (Preliminary result)   Collection Time: 05/10/23  9:10 PM   Specimen: BLOOD  Result Value Ref Range Status   Specimen Description   Final    BLOOD BLOOD RIGHT ARM Performed at [facility], [address], [place], [place] [address]    Special Requests   Final    BOTTLES DRAWN AEROBIC AND ANAEROBIC Blood Culture results may not be optimal due  to an inadequate volume of blood received in culture bottles Performed at [facility], [address] [place], [place] [address]    Culture   Final    NO GROWTH 3 DAYS Performed at [facility], [address] [place], [place] [address]    Report Status PENDING  Incomplete  Blood culture (routine x 2)     Status: None (Preliminary result)   Collection Time: 05/11/23  4:46 AM   Specimen: BLOOD  Result Value Ref Range Status   Specimen Description   Final    BLOOD BLOOD RIGHT ARM AEROBIC BOTTLE ONLY Performed at [facility], [address], [place], [place] [address]    Special Requests   Final    BOTTLES DRAWN AEROBIC ONLY Blood Culture adequate volume Performed at [facility], [address] [place], [place] [address]    Culture   Final    NO GROWTH 2 DAYS Performed at [facility], [address], [place], [place] [address]    Report Status PENDING  Incomplete    Procedures/Studies: CT ABDOMEN PELVIS W CONTRAST  Result Date: 05/10/2023 CLINICAL DATA:  Abdominal pain for several weeks, initial encounter EXAM: CT ABDOMEN AND PELVIS WITH CONTRAST TECHNIQUE: Multidetector CT imaging of the abdomen and pelvis was performed using the standard protocol following bolus administration of intravenous contrast. RADIATION DOSE REDUCTION: This exam was performed according to the departmental dose-optimization program which includes automated exposure control, adjustment of the mA and/or kV according to patient size and/or use of iterative reconstruction technique. CONTRAST:  OMNIPAQUE IOHEXOL 300 MG/ML  SOLN COMPARISON:  04/24/2023 FINDINGS: Lower chest: Lung bases show no focal infiltrate or sizable effusion. A pleural based nodule is noted which measures approximately 6 mm which is stable from the prior exam. Hepatobiliary: Multiple peripherally enhancing lesions are identified throughout the liver similar to that  seen on prior CT examination consistent with metastatic disease. The gallbladder is decompressed. Pancreas: Pancreas  again demonstrates a predominately hypodense mass lesion within the body and tail of the pancreas similar to that seen on prior CT examination consistent with the known pancreatic malignancy. Spleen: Normal in size without focal abnormality. Adrenals/Urinary Tract: Adrenal glands are within normal limits. Kidneys demonstrate a normal enhancement pattern bilaterally. Normal excretion is noted. No renal calculi or obstructive changes are seen. The bladder is decompressed. There is air in within the anterior aspect of the bladder wall suspicious for emphysematous cystitis. Correlate with urinalysis. These changes are new from the prior exam. Stomach/Bowel: No obstructive or inflammatory changes of the colon are seen. The appendix is not well visualized and may have been surgically removed. No inflammatory changes are seen. Small bowel and stomach are unremarkable. Vascular/Lymphatic: Atherosclerotic calcifications of the abdominal aorta are noted. No aneurysmal dilatation is seen. There is again noted encasement of the celiac axis as well as a fat plane adjacent to the superior mesenteric artery consistent with localized neoplastic invasion. 18 mm short axis node is noted in the portacaval space stable from the prior exam. Splenic vein occlusion is not again seen with some collateral flow identified. Reproductive: Status post hysterectomy. No adnexal masses. Other: No abdominal wall hernia or abnormality. No abdominopelvic ascites. Musculoskeletal: Degenerative changes of lumbar spine are seen. IMPRESSION: Changes consistent with the known history of pancreatic neoplasm stable in appearance from the recent exam. Associated vascular encasement is noted as well as splenic vein occlusion and adjacent portacaval adenopathy. Hepatic metastatic disease is again identified and stable. Stable 6 mm subpleural  nodule in the right lower lobe. This is stable dating back to January of 2022 and likely of a benign etiology. No acute abnormality is noted. Electronically Signed   By: Alcide Clever M.D.   On: 05/10/2023 19:26   CT CELIAC PLEXUS BLOCK NEUROLYTIC  Result Date: 05/04/2023 CLINICAL DATA:  Refractory abdominal pain secondary to metastatic pancreatic carcinoma. The patient presents for celiac block with planned neurolysis of the celiac plexus. EXAM: CT GUIDED NEUROLYTIC ABLATION OF THE CELIAC AXIS ANESTHESIA/SEDATION: Moderate (conscious) sedation was employed during this procedure. A total of Versed 2.0 mg and Fentanyl 100 mcg was administered intravenously. Moderate Sedation Time: 23 minutes. The patient's level of consciousness and vital signs were monitored continuously by radiology nursing throughout the procedure under my direct supervision. PROCEDURE: The procedure risks, benefits, and alternatives were explained to the patient. Questions regarding the procedure were encouraged and answered. The patient understands and consents to the procedure. A time-out was performed prior to initiating the procedure. CT was performed in a supine position through the upper to mid abdomen. The anterior abdominal wall was prepped with chlorhexidine in a sterile fashion, and a sterile drape was applied covering the operative field. A sterile gown and sterile gloves were used for the procedure. Local anesthesia was provided with 1% Lidocaine. A 22 gauge Chiba needle was advanced under CT guidance to the level of the celiac plexus. After confirming needle tip position, approximately three ml of a 1:10 dilution of Omnipaque-300 contrast and saline was injected. Spread of diluted contrast material was confirmed by CT. A mixture of 5 ml of 0.5% Sensorcaine and 80 mg Depo-Medrol was then made. This was injected through the needle. Alcohol neurolytic ablation of the celiac plexus was then performed with injection of 20 ml of  absolute alcohol. COMPLICATIONS: None FINDINGS: Contrast injection showed excellent spread across the midline at the level of the celiac plexus. Alcohol ablation was successfully performed. IMPRESSION: CT guided neurolytic  ablation of the celiac plexus as above. Alcohol ablation was performed with 20 ml of absolute alcohol. Therapeutic injection of Sensorcaine and Depo-Medrol was also performed just prior to alcohol ablation. Electronically Signed   By: Irish Lack M.D.   On: 05/04/2023 14:17   US BIOPSY (LIVER)  Result Date: 05/03/2023 INDICATION: 62 year old with pancreatic mass and liver lesions. Probable metastatic pancreatic cancer. Port-A-Cath was placed immediately prior to this procedure. EXAM: ULTRASOUND-GUIDED LIVER LESION BIOPSY MEDICATIONS: Moderate sedation ANESTHESIA/SEDATION: Moderate (conscious) sedation was employed during this procedure. A total of Versed 1 mg was administered intravenously by the radiology nurse. Additional Versed and fentanyl was given immediately prior to this procedure for the Port-A-Cath placement. Total intra-service moderate Sedation Time: 20 minutes. The patient's level of consciousness and vital signs were monitored continuously by radiology nursing throughout the procedure under my direct supervision. FLUOROSCOPY TIME:  None COMPLICATIONS: None immediate. PROCEDURE: Informed written consent was obtained from the patient after a thorough discussion of the procedural risks, benefits and alternatives. All questions were addressed. Maximal Sterile Barrier Technique was utilized including caps, mask, sterile gowns, sterile gloves, sterile drape, hand hygiene and skin antiseptic. A timeout was performed prior to the initiation of the procedure. Ultrasound demonstrated subtle hypoechoic liver lesions. Right hepatic lesion was targeted. Right side of the abdomen was prepped with chlorhexidine and sterile field was created. Skin was anesthetized using 1% lidocaine. Small  incision was made. Using ultrasound guidance, 17 gauge coaxial needle was directed into the right hepatic lobe and a hypoechoic lesion. Three core biopsies were obtained with an 18 gauge core device. Specimens placed in formalin. 17 gauge needle was removed without complication. Bandage placed over the puncture site. FINDINGS: Subtle hypoechoic lesions in the liver. Core biopsy needle was confirmed within a small right hepatic lesion. No immediate bleeding or hematoma formation. IMPRESSION: Ultrasound-guided core biopsy of a right hepatic lesion. Electronically Signed   By: Richarda Overlie M.D.   On: 05/03/2023 21:38   IR IMAGING GUIDED PORT INSERTION  Result Date: 05/03/2023 INDICATION: 62 year old with pancreatic mass and liver lesions. Probable metastatic pancreatic cancer. Plan for placement of Port-A-Cath and liver lesion biopsy. EXAM: FLUOROSCOPIC AND ULTRASOUND GUIDED PLACEMENT OF A SUBCUTANEOUS PORT COMPARISON:  None Available. MEDICATIONS: Moderate sedation ANESTHESIA/SEDATION: Moderate (conscious) sedation was employed during this procedure. A total of Versed 1.75 mg and fentanyl 100 mcg was administered intravenously at the order of the provider performing the procedure. Total intra-service moderate sedation time: 29 minutes. Patient's level of consciousness and vital signs were monitored continuously by radiology nurse throughout the procedure under the supervision of the provider performing the procedure. FLUOROSCOPY TIME:  Radiation Exposure Index (as provided by the fluoroscopic device): 1 mGy Kerma COMPLICATIONS: None immediate. PROCEDURE: The procedure, risks, benefits, and alternatives were explained to the patient. Questions regarding the procedure were encouraged and answered. The patient understands and consents to the procedure. Patient was placed supine on the interventional table. Ultrasound confirmed a patent right internal jugular vein. Ultrasound image was saved for documentation. The right  chest and neck were cleaned with a skin antiseptic and a sterile drape was placed. Maximal barrier sterile technique was utilized including caps, mask, sterile gowns, sterile gloves, sterile drape, hand hygiene and skin antiseptic. The right neck was anesthetized with 1% lidocaine. Small incision was made in the right neck with a blade. Micropuncture set was placed in the right internal jugular vein with ultrasound guidance. The micropuncture wire was used for measurement purposes. The right chest was  anesthetized with 1% lidocaine with epinephrine. #15 blade was used to make an incision and a subcutaneous port pocket was formed. 8 french Power Port was assembled. Subcutaneous tunnel was formed with a stiff tunneling device. The port catheter was brought through the subcutaneous tunnel. The port was placed in the subcutaneous pocket. The micropuncture set was exchanged for a peel-away sheath. The catheter was placed through the peel-away sheath and the tip was positioned at the superior cavoatrial junction. Catheter placement was confirmed with fluoroscopy. The port was accessed and flushed with saline. The port pocket was closed using two layers of absorbable sutures and Dermabond. The vein skin site was closed using a single layer of absorbable suture and Dermabond. Sterile dressings were applied. Patient tolerated the procedure well without an immediate complication. Ultrasound and fluoroscopic images were taken and saved for this procedure. IMPRESSION: Placement of a subcutaneous power-injectable port device. Catheter tip at the superior cavoatrial junction. Electronically Signed   By: Richarda Overlie M.D.   On: 05/03/2023 21:35   CT ABDOMEN PELVIS W CONTRAST  Result Date: 04/24/2023 CLINICAL DATA:  Abdominal pain.  Metastatic pancreatic cancer. EXAM: CT ABDOMEN AND PELVIS WITH CONTRAST TECHNIQUE: Multidetector CT imaging of the abdomen and pelvis was performed using the standard protocol following bolus  administration of intravenous contrast. RADIATION DOSE REDUCTION: This exam was performed according to the departmental dose-optimization program which includes automated exposure control, adjustment of the mA and/or kV according to patient size and/or use of iterative reconstruction technique. CONTRAST:  OMNIPAQUE IOHEXOL 300 MG/ML  SOLN COMPARISON:  CT abdomen pelvis dated 04/03/2023. FINDINGS: Lower chest: Similar appearance of right lung base subpleural nodules measure up to 8 mm. Mitral and aortic valve repairs. No intra-abdominal free air or free fluid. Hepatobiliary: Multiple hypoenhancing hepatic metastatic disease. These lesions appear more conspicuous or have increased since the prior CT. No biliary dilatation. The gallbladder is unremarkable. Pancreas: Hypoenhancing mass involving the body and tail of the pancreas as seen previously in keeping with known malignancy. Spleen: Normal in size without focal abnormality. Adrenals/Urinary Tract: The adrenal glands are unremarkable. There is no hydronephrosis on either side. There is symmetric enhancement and excretion of contrast by both kidneys. The visualized ureters and the bladder appear unremarkable. Stomach/Bowel: Postsurgical changes of bowel with anastomotic suture in the pelvis. There is no bowel obstruction or active inflammation. The appendix is not visualized with certainty. No inflammatory changes identified in the right lower quadrant. Vascular/Lymphatic: Mild aortoiliac atherosclerotic disease. There is tumor encasement of the celiac trunk. There is infiltration of the fat plane around the proximal SMA suggestive of encasement. The celiac artery, SMA, IMA are patent. The renal arteries are patent. There is in case mint of the proximal half of the splenic artery with mass effect and high-grade luminal narrowing. The splenic vein is not visualized and appears thrombosed. The SMV and main portal vein are patent. A 2.7 x 2.0 cm hypoenhancing  soft tissue abutting the main portal vein (25/2) may represent an enlarged and metastatic adenopathy versus infiltrative tumor. No portal venous gas. Reproductive: Hysterectomy.  No adnexal masses. Other: Midline vertical anterior pelvic wall incisional scar. Musculoskeletal: Degenerative changes of the spine. No acute osseous pathology. IMPRESSION: 1. No acute intra-abdominal or pelvic pathology. 2. Hypoenhancing mass involving the body and tail of the pancreas in keeping with known malignancy. 3. Multiple hypoenhancing hepatic metastatic disease. These lesions appear more conspicuous or have increased since the prior CT. 4. 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 6. Similar appearance of right lung base subpleural nodules measure up to 8 mm. 7.  Aortic Atherosclerosis (ICD10-I70.0). Electronically Signed   By: Elgie Collard M.D.   On: 04/24/2023 21:06     Time coordinating discharge: Over 30 minutes    Lewie Chamber, MD  [facility] 05/13/2023, 3:10 PM

## 2023-05-13 NOTE — TOC Transition Note (Addendum)
Transition of Care [facility]) - CM/SW Discharge Note   Patient Details  Name: [name] MRN: [id number] Date of Birth: 11-Dec-1960  Transition [facility] Care [facility]) CM/SW Contact:  Adrian Prows, RN Phone Number: 05/13/2023, 12:06 PM   Clinical Narrative:    [facility] consult for d/c planning; orders received for HHPT/RN/Aide; spoke w/ pt in room and sister Alvira Philips on speaker phone; they agree to recc; they do not have an agency preference; they also chose [facility] for home hospice; notified [facility] for [facility] and she will see pt; contacted Kandee Keen at [facility] and he says agency can provide services; contact info for agency placed in follow up provider section of d/c instructions; pt/sister would like to have pt's meds delivered to the bedside; Dr Frederick Peers notified and will see if d/c meds are available at [facility] outpatient pharmacy.  -[id number]- notified by Elsie Ra from pharmacy meds will be delivered to room; pt notified; pt says she has transportation; no TOC needs.   Final next level of care: Home w Home Health Services Barriers to Discharge: No Barriers Identified   Patient Goals and CMS Choice      Discharge Placement                         Discharge Plan and Services Additional resources added to the After Visit Summary for     Discharge Planning Services: CM Consult Post Acute Care Choice:  (pt declines HHPT and rolling walker)          DME Arranged: Patient refused services         HH Arranged: RN, PT, Nurse's Aide HH Agency: [facility] Date [facility] Agency Contacted: 05/13/23 Time HH Agency Contacted: 1206 Representative spoke with at [facility] Agency: Kandee Keen  Social Determinants of Health (SDOH) Interventions SDOH Screenings   Food Insecurity: No Food Insecurity (05/12/2023)  Housing: Low Risk  (05/12/2023)  Transportation Needs: No Transportation Needs (05/12/2023)  Utilities: Not At Risk (05/12/2023)  Recent Concern: Utilities - At Risk  (04/04/2023)  Tobacco Use: High Risk (05/10/2023)     Readmission Risk Interventions    05/11/2023   11:31 AM 04/08/2023    2:15 PM 08/06/2022    2:13 PM  Readmission Risk Prevention Plan  Transportation Screening Complete Complete Complete  Medication Review [facility]) Complete  Complete  PCP or Specialist appointment within 3-5 days of discharge Complete  Complete  HRI or Home Care Consult Complete Complete Complete  SW Recovery Care/Counseling Consult Complete Complete Complete  Palliative Care Screening Complete Not Applicable Not Applicable  Skilled Nursing Facility Not Applicable Not Applicable Not Applicable

## 2023-05-13 NOTE — Progress Notes (Signed)
[facility]) Hospital Liaison Note  Referral received from [facility], Burnard Bunting, regarding patient interest in hospice services in the home after discharge.  Met with patient in room to explain hospice philosophy, services and team approach to care. Patient verbalized understanding. Patient desires to be primary contact for services--address and phone number verified as correct in chart.  Per discussion, plan is for patient to discharge today via private vehicle.  Patient confirmed she has DME in home and no needs for DME beyond what she currently has.  Please send signed and completed DNR with patient at discharge.  Please provide prescriptions at discharge as needed for ongoing symptom management.  [facility] contact information given to patient.  Thank you for the opportunity to participate in this patient's care.  Doreatha Martin, RN [facility] Liaison [phone]

## 2023-05-13 NOTE — Progress Notes (Signed)
[name]   DOB:1961/06/22   ZO#:[id number]   JWJ#:[id number]  Med/onc follow up   Subjective: Patient is able to tolerate oral intake, her pain has improved, but not well-controlled per patient.  She is able to walk in the hallway, but still feels dizzy, and very fatigued.  She has been in recliner for bed most of the time.  Objective:  Vitals:   05/13/23 0837 05/13/23 1129  BP: (!) 117/96 (!) 124/104  Pulse: (!) 117 (!) 129  Resp: 18 17  Temp: 98.1 F (36.7 C) 97.8 F (36.6 C)  SpO2: 100% 100%    Body mass index is 21.59 kg/m.  Intake/Output Summary (Last 24 hours) at 05/13/2023 1202 Last data filed at 05/13/2023 1152 Gross per 24 hour  Intake 130 ml  Output --  Net 130 ml     Sclerae unicteric  Oropharynx clear  No peripheral adenopathy  Lungs clear -- no rales or rhonchi  Heart regular rate and rhythm  Abdomen soft, with tenderness in the mid and upper abdomen  MSK no focal spinal tenderness, no peripheral edema  Neuro nonfocal    CBG (last 3)  Recent Labs    05/12/23 2304 05/13/23 0718 05/13/23 1147  GLUCAP 190* 238* 254*     Labs:   Urine Studies No results for input(s): "UHGB", "CRYS" in the last 72 hours.  Invalid input(s): "UACOL", "UAPR", "USPG", "UPH", "UTP", "UGL", "UKET", "UBIL", "UNIT", "UROB", "ULEU", "UEPI", "UWBC", "URBC", "UBAC", "CAST", "UCOM", "BILUA"  Basic Metabolic Panel: Recent Labs  Lab 05/10/23 1754 05/10/23 1818 05/11/23 0445  NA 131* 133* 131*  K 4.7 4.4 3.8  CL 98 97* 101  CO2 22  --  21*  GLUCOSE 288* 277* 201*  BUN 25* 27* 17  CREATININE 0.89 0.70 0.64  CALCIUM 8.5*  --  7.9*  MG  --   --  1.6*  PHOS  --   --  2.7   GFR Estimated Creatinine Clearance: 79.8 mL/min (by C-G formula based on SCr of 0.64 mg/dL). Liver Function Tests: Recent Labs  Lab 05/10/23 1754 05/11/23 0445  AST 28 25  ALT 32 29  ALKPHOS 126 108  BILITOT 0.9 0.8  PROT 6.8 6.0*  ALBUMIN 3.8 3.3*   Recent Labs  Lab 05/10/23 1754   LIPASE 52*    No results for input(s): "AMMONIA" in the last 168 hours. Coagulation profile Recent Labs  Lab 05/10/23 1811 05/12/23 0406 05/13/23 0334  INR 1.0 1.2 1.4*    CBC: Recent Labs  Lab 05/10/23 1754 05/10/23 1818 05/11/23 0445 05/12/23 0406 05/13/23 0334  WBC 7.2  --  6.6 5.7 5.5  NEUTROABS 4.7  --   --   --   --   HGB 13.0 13.3 11.3* 10.2* 10.2*  HCT 38.3 39.0 33.9* 30.5* 30.5*  MCV 91.6  --  92.1 92.7 93.6  PLT 169  --  141* 128* 131*   Cardiac Enzymes: No results for input(s): "CKTOTAL", "CKMB", "CKMBINDEX", "TROPONINI" in the last 168 hours. BNP: Invalid input(s): "POCBNP" CBG: Recent Labs  Lab 05/12/23 1147 05/12/23 1715 05/12/23 2304 05/13/23 0718 05/13/23 1147  GLUCAP 156* 195* 190* 238* 254*   D-Dimer No results for input(s): "DDIMER" in the last 72 hours. Hgb A1c No results for input(s): "HGBA1C" in the last 72 hours. Lipid Profile No results for input(s): "CHOL", "HDL", "LDLCALC", "TRIG", "CHOLHDL", "LDLDIRECT" in the last 72 hours. Thyroid function studies No results for input(s): "TSH", "T4TOTAL", "T3FREE", "THYROIDAB" in the last 72 hours.  Invalid input(s): "FREET3" Anemia work up No results for input(s): "VITAMINB12", "FOLATE", "FERRITIN", "TIBC", "IRON", "RETICCTPCT" in the last 72 hours. Microbiology Recent Results (from the past 240 hour(s))  Urine Culture (for pregnant, neutropenic or urologic patients or patients with an indwelling urinary catheter)     Status: Abnormal   Collection Time: 05/10/23  5:25 PM   Specimen: Urine, Clean Catch  Result Value Ref Range Status   Specimen Description   Final    URINE, CLEAN CATCH Performed at [facility], [address] [place], [place] [address]    Special Requests   Final    NONE Performed at [facility], [address], [place], [place] [address]    Culture >=100,000 COLONIES/mL ESCHERICHIA COLI (A)  Final   Report Status 05/12/2023  FINAL  Final   Organism ID, Bacteria ESCHERICHIA COLI (A)  Final      Susceptibility   Escherichia coli - MIC*    AMPICILLIN >=32 RESISTANT Resistant     CEFAZOLIN <=4 SENSITIVE Sensitive     CEFEPIME <=0.12 SENSITIVE Sensitive     CEFTRIAXONE <=0.25 SENSITIVE Sensitive     CIPROFLOXACIN >=4 RESISTANT Resistant     GENTAMICIN <=1 SENSITIVE Sensitive     IMIPENEM <=0.25 SENSITIVE Sensitive     NITROFURANTOIN <=16 SENSITIVE Sensitive     TRIMETH/SULFA >=320 RESISTANT Resistant     AMPICILLIN/SULBACTAM 16 INTERMEDIATE Intermediate     PIP/TAZO <=4 SENSITIVE Sensitive     * >=100,000 COLONIES/mL ESCHERICHIA COLI  Blood culture (routine x 2)     Status: None (Preliminary result)   Collection Time: 05/10/23  9:10 PM   Specimen: BLOOD  Result Value Ref Range Status   Specimen Description   Final    BLOOD BLOOD RIGHT ARM Performed at [facility], [address], [place], [place] [address]    Special Requests   Final    BOTTLES DRAWN AEROBIC AND ANAEROBIC Blood Culture results may not be optimal due to an inadequate volume of blood received in culture bottles Performed at [facility], [address], [place], [place] [address]    Culture   Final    NO GROWTH 2 DAYS Performed at [facility], [name], [place], [place] [address]    Report Status PENDING  Incomplete  Blood culture (routine x 2)     Status: None (Preliminary result)   Collection Time: 05/11/23  4:46 AM   Specimen: BLOOD  Result Value Ref Range Status   Specimen Description   Final    BLOOD BLOOD RIGHT ARM AEROBIC BOTTLE ONLY Performed at [facility], [address], [place], [place] [address]    Special Requests   Final    BOTTLES DRAWN AEROBIC ONLY Blood Culture adequate volume Performed at [facility], [address], [place], [place] [address]    Culture   Final    NO GROWTH < 24 HOURS Performed at [facility], [address], [place], [place] [address]    Report Status PENDING  Incomplete      Studies:  No results found.  Assessment: 62 y.o. female   metastatic pancreatic cancer to liver Worsening abdominal pain, secondary to #1, s/p celiac nerve block Moderate protein and calorie malnutrition, anorexia Type 2 diabetes with neuropathy Atrial fibrillation, CHF CKD stage 3a COPD Anxiety and depression DNR     Plan:  -Due to her persistent abdominal pain, anorexia, and  very limited social support, her failure to thrive at home, and a very limited social support, she is not a candidate for chemotherapy.  -Her prognosis is poor, her anticipated life expectancy is a few months to 6 months.  She qualifies for hospice service. -Per PT evaluation, she does not qualify SNF placement. -However she is probably not to be able to live independently at home, she needs a lot of care, especially personal care.  Case manager has been working with patient and her sister to set up home care service, but her insurance would not cover 24/7 personal care.  She is not able to live with her sister who still works.  Her only son has mental illness and is not able to help her. -this is a difficult situation. Hopefully home hospice can offer adequate assistance and care at home for her. -If she does go home with home hospice, I will be happy to be her attending.  Please make sure she has adequate pain medication when she gets home. -I will see her as needed.   Malachy Mood, MD 05/13/2023  12:02 PM

## 2023-05-14 ENCOUNTER — Other Ambulatory Visit (HOSPITAL_COMMUNITY): Payer: Self-pay

## 2023-05-14 LAB — CULTURE, BLOOD (ROUTINE X 2): Special Requests: ADEQUATE

## 2023-05-14 MED ORDER — XTAMPZA ER 36 MG PO C12A
36.0000 mg | EXTENDED_RELEASE_CAPSULE | Freq: Three times a day (TID) | ORAL | 0 refills | Status: DC
  Filled 2023-05-25: qty 90, 30d supply, fill #0

## 2023-05-14 MED ORDER — OXYCODONE HCL 10 MG PO TABS
10.0000 mg | ORAL_TABLET | ORAL | 0 refills | Status: DC
  Filled 2023-05-16: qty 60, 5d supply, fill #0

## 2023-05-15 ENCOUNTER — Emergency Department (HOSPITAL_COMMUNITY)

## 2023-05-15 ENCOUNTER — Other Ambulatory Visit: Payer: Self-pay

## 2023-05-15 ENCOUNTER — Observation Stay (HOSPITAL_COMMUNITY)
Admission: EM | Admit: 2023-05-15 | Discharge: 2023-05-16 | Disposition: A | Discharge status: Hospice | Attending: Internal Medicine | Admitting: Internal Medicine

## 2023-05-15 DIAGNOSIS — E860 Dehydration: Secondary | ICD-10-CM | POA: Diagnosis not present

## 2023-05-15 DIAGNOSIS — E1169 Type 2 diabetes mellitus with other specified complication: Secondary | ICD-10-CM | POA: Diagnosis not present

## 2023-05-15 DIAGNOSIS — Z7985 Long-term (current) use of injectable non-insulin antidiabetic drugs: Secondary | ICD-10-CM | POA: Insufficient documentation

## 2023-05-15 DIAGNOSIS — E43 Unspecified severe protein-calorie malnutrition: Secondary | ICD-10-CM | POA: Diagnosis not present

## 2023-05-15 DIAGNOSIS — J449 Chronic obstructive pulmonary disease, unspecified: Secondary | ICD-10-CM | POA: Diagnosis not present

## 2023-05-15 DIAGNOSIS — I509 Heart failure, unspecified: Secondary | ICD-10-CM | POA: Diagnosis not present

## 2023-05-15 DIAGNOSIS — E861 Hypovolemia: Secondary | ICD-10-CM | POA: Insufficient documentation

## 2023-05-15 DIAGNOSIS — I959 Hypotension, unspecified: Secondary | ICD-10-CM | POA: Insufficient documentation

## 2023-05-15 DIAGNOSIS — Z79899 Other long term (current) drug therapy: Secondary | ICD-10-CM | POA: Diagnosis not present

## 2023-05-15 DIAGNOSIS — S0083XA Contusion of other part of head, initial encounter: Secondary | ICD-10-CM | POA: Diagnosis not present

## 2023-05-15 DIAGNOSIS — C259 Malignant neoplasm of pancreas, unspecified: Secondary | ICD-10-CM | POA: Diagnosis not present

## 2023-05-15 DIAGNOSIS — W19XXXA Unspecified fall, initial encounter: Secondary | ICD-10-CM | POA: Insufficient documentation

## 2023-05-15 DIAGNOSIS — Z952 Presence of prosthetic heart valve: Secondary | ICD-10-CM | POA: Insufficient documentation

## 2023-05-15 DIAGNOSIS — R531 Weakness: Secondary | ICD-10-CM | POA: Diagnosis present

## 2023-05-15 DIAGNOSIS — Z794 Long term (current) use of insulin: Secondary | ICD-10-CM | POA: Insufficient documentation

## 2023-05-15 DIAGNOSIS — F17210 Nicotine dependence, cigarettes, uncomplicated: Secondary | ICD-10-CM | POA: Insufficient documentation

## 2023-05-15 DIAGNOSIS — S0181XA Laceration without foreign body of other part of head, initial encounter: Secondary | ICD-10-CM | POA: Insufficient documentation

## 2023-05-15 DIAGNOSIS — N179 Acute kidney failure, unspecified: Secondary | ICD-10-CM | POA: Diagnosis not present

## 2023-05-15 DIAGNOSIS — N1831 Chronic kidney disease, stage 3a: Secondary | ICD-10-CM | POA: Diagnosis not present

## 2023-05-15 DIAGNOSIS — E1122 Type 2 diabetes mellitus with diabetic chronic kidney disease: Secondary | ICD-10-CM | POA: Diagnosis not present

## 2023-05-15 DIAGNOSIS — R627 Adult failure to thrive: Secondary | ICD-10-CM | POA: Diagnosis not present

## 2023-05-15 DIAGNOSIS — I480 Paroxysmal atrial fibrillation: Secondary | ICD-10-CM | POA: Diagnosis not present

## 2023-05-15 DIAGNOSIS — R55 Syncope and collapse: Secondary | ICD-10-CM

## 2023-05-15 DIAGNOSIS — Z7901 Long term (current) use of anticoagulants: Secondary | ICD-10-CM | POA: Insufficient documentation

## 2023-05-15 DIAGNOSIS — E785 Hyperlipidemia, unspecified: Secondary | ICD-10-CM | POA: Diagnosis not present

## 2023-05-15 DIAGNOSIS — G893 Neoplasm related pain (acute) (chronic): Secondary | ICD-10-CM | POA: Diagnosis present

## 2023-05-15 DIAGNOSIS — I130 Hypertensive heart and chronic kidney disease with heart failure and stage 1 through stage 4 chronic kidney disease, or unspecified chronic kidney disease: Secondary | ICD-10-CM | POA: Insufficient documentation

## 2023-05-15 LAB — LACTIC ACID, PLASMA: Lactic Acid, Venous: 2.5 mmol/L (ref 0.5–1.9)

## 2023-05-15 LAB — COMPREHENSIVE METABOLIC PANEL
ALT: 32 U/L (ref 0–44)
AST: 25 U/L (ref 15–41)
Albumin: 3.4 g/dL — ABNORMAL LOW (ref 3.5–5.0)
Alkaline Phosphatase: 127 U/L — ABNORMAL HIGH (ref 38–126)
Anion gap: 14 (ref 5–15)
BUN: 17 mg/dL (ref 8–23)
CO2: 18 mmol/L — ABNORMAL LOW (ref 22–32)
Calcium: 8.4 mg/dL — ABNORMAL LOW (ref 8.9–10.3)
Chloride: 97 mmol/L — ABNORMAL LOW (ref 98–111)
Creatinine, Ser: 1.9 mg/dL — ABNORMAL HIGH (ref 0.44–1.00)
GFR, Estimated: 29 mL/min — ABNORMAL LOW (ref >60)
Glucose, Bld: 182 mg/dL — ABNORMAL HIGH (ref 70–99)
Potassium: 3.8 mmol/L (ref 3.5–5.1)
Sodium: 129 mmol/L — ABNORMAL LOW (ref 135–145)
Total Bilirubin: 0.6 mg/dL (ref 0.3–1.2)
Total Protein: 6 g/dL — ABNORMAL LOW (ref 6.5–8.1)

## 2023-05-15 LAB — I-STAT CHEM 8, ED
BUN: 18 mg/dL (ref 8–23)
Calcium, Ion: 1.1 mmol/L — ABNORMAL LOW (ref 1.15–1.40)
Chloride: 99 mmol/L (ref 98–111)
Creatinine, Ser: 1.9 mg/dL — ABNORMAL HIGH (ref 0.44–1.00)
Glucose, Bld: 180 mg/dL — ABNORMAL HIGH (ref 70–99)
HCT: 34 % — ABNORMAL LOW (ref 36.0–46.0)
Hemoglobin: 11.6 g/dL — ABNORMAL LOW (ref 12.0–15.0)
Potassium: 3.9 mmol/L (ref 3.5–5.1)
Sodium: 131 mmol/L — ABNORMAL LOW (ref 135–145)
TCO2: 20 mmol/L — ABNORMAL LOW (ref 22–32)

## 2023-05-15 LAB — CBC
HCT: 30.2 % — ABNORMAL LOW (ref 36.0–46.0)
Hemoglobin: 10 g/dL — ABNORMAL LOW (ref 12.0–15.0)
MCH: 31.1 pg (ref 26.0–34.0)
MCHC: 33.1 g/dL (ref 30.0–36.0)
MCV: 93.8 fL (ref 80.0–100.0)
Platelets: 165 10*3/uL (ref 150–400)
RBC: 3.22 MIL/uL — ABNORMAL LOW (ref 3.87–5.11)
RDW: 14.6 % (ref 11.5–15.5)
WBC: 7.7 10*3/uL (ref 4.0–10.5)
nRBC: 0.4 % — ABNORMAL HIGH (ref 0.0–0.2)

## 2023-05-15 LAB — TYPE AND SCREEN
ABO/RH(D): B POS
Antibody Screen: NEGATIVE

## 2023-05-15 LAB — PROTIME-INR
INR: 1.8 — ABNORMAL HIGH (ref 0.8–1.2)
Prothrombin Time: 20.6 seconds — ABNORMAL HIGH (ref 11.4–15.2)

## 2023-05-15 LAB — CULTURE, BLOOD (ROUTINE X 2): Culture: NO GROWTH

## 2023-05-15 LAB — LIPASE, BLOOD: Lipase: 28 U/L (ref 11–51)

## 2023-05-15 MED ORDER — SODIUM CHLORIDE 0.9% FLUSH: 10.0000 mL | INTRAVENOUS | Status: DC | PRN

## 2023-05-15 MED ORDER — ONDANSETRON HCL 4 MG/2ML IJ SOLN: 4.0000 mg | Freq: Four times a day (QID) | INTRAMUSCULAR | Status: DC | PRN

## 2023-05-15 MED ORDER — HYDROMORPHONE HCL 1 MG/ML IJ SOLN
0.5000 mg | Freq: Once | INTRAMUSCULAR | Status: AC
  Administered 2023-05-15: 0.5 mg via INTRAVENOUS
  Filled 2023-05-15: qty 1

## 2023-05-15 MED ORDER — ACETAMINOPHEN 650 MG RE SUPP: 650.0000 mg | Freq: Four times a day (QID) | RECTAL | Status: DC | PRN

## 2023-05-15 MED ORDER — CHLORHEXIDINE GLUCONATE CLOTH 2 % EX PADS
6.0000 | MEDICATED_PAD | Freq: Every day | CUTANEOUS | Status: DC
  Administered 2023-05-16: 6 via TOPICAL

## 2023-05-15 MED ORDER — SODIUM CHLORIDE 0.9% FLUSH
10.0000 mL | Freq: Two times a day (BID) | INTRAVENOUS | Status: DC
  Administered 2023-05-16 (×2): 10 mL

## 2023-05-15 MED ORDER — LIDOCAINE-EPINEPHRINE (PF) 2 %-1:200000 IJ SOLN
20.0000 mL | Freq: Once | INTRAMUSCULAR | Status: AC
  Administered 2023-05-15: 20 mL
  Filled 2023-05-15: qty 20

## 2023-05-15 MED ORDER — LACTATED RINGERS IV BOLUS
1000.0000 mL | Freq: Once | INTRAVENOUS | Status: AC
  Administered 2023-05-15: 1000 mL via INTRAVENOUS

## 2023-05-15 MED ORDER — IOHEXOL 350 MG/ML SOLN
75.0000 mL | Freq: Once | INTRAVENOUS | Status: AC | PRN
  Administered 2023-05-15: 75 mL via INTRAVENOUS

## 2023-05-15 MED ORDER — HYDROMORPHONE HCL 1 MG/ML IJ SOLN
0.5000 mg | INTRAMUSCULAR | Status: DC | PRN
  Administered 2023-05-15 – 2023-05-16 (×4): 1 mg via INTRAVENOUS
  Filled 2023-05-15 (×4): qty 1

## 2023-05-15 MED ORDER — ACETAMINOPHEN 325 MG PO TABS: 650.0000 mg | ORAL_TABLET | Freq: Four times a day (QID) | ORAL | Status: DC | PRN

## 2023-05-15 MED ORDER — LACTATED RINGERS IV SOLN: INTRAVENOUS | Status: DC

## 2023-05-15 MED ORDER — ONDANSETRON HCL 4 MG/2ML IJ SOLN
4.0000 mg | Freq: Once | INTRAMUSCULAR | Status: AC
  Administered 2023-05-15: 4 mg via INTRAVENOUS
  Filled 2023-05-15: qty 2

## 2023-05-15 MED ORDER — ONDANSETRON HCL 4 MG PO TABS: 4.0000 mg | ORAL_TABLET | Freq: Four times a day (QID) | ORAL | Status: DC | PRN

## 2023-05-15 NOTE — Consult Note (Signed)
Activation and Reason: Level 1 fall on warfarin  Primary Survey:  Airway: intact, talking Breathing: bilateral breath sounds Circulation: palpable radial pulses Disability: GCS 15  HPI: [name] is an 62 y.o. female with stage IV pancreatic adenocarcinoma (diagnosed May 2024), rheumatic heart disease s/p repair May 2023, pericardial effusion/tamponade s/p pericardiocentesis June 2023, PAF on Coumadin, CKD 3A, COPD, DM II, HTN - discharged 2 days ago following care for UTI; on palliative care and discharged to residential hospice 6/7. By report she fell from standing following apparent syncope. SBP 90 en route and with apparent head laceration, on blood thinner and activated as level 1  On arrival, she is alert and oriented. She reports discomfort on her forehead. She denies pain in her neck, chest/abdomen/pelvis/back/any extremity. Reports she did ambulate with EMS on scene following.  Past Medical History:  Diagnosis Date   Acute pericardial effusion 05/17/2022   AKI (acute kidney injury) (HCC) 05/17/2022   Anxiety    Arthritis    Cervicalgia    CHF (congestive heart failure) (HCC)    Chondromalacia of patella    Chronic neck pain    Chronic pain syndrome    Depression    secondary to loss of her son at age 23   Diabetic neuropathy (HCC)    DMII (diabetes mellitus, type 2) (HCC)    Dysrhythmia    Enthesopathy of hip region    Fibromyalgia    GERD (gastroesophageal reflux disease)    Hep C w/o coma, chronic (HCC)    Hepatitis C    diagnosed 2005   Hypertension    Insomnia    Low back pain    Lumbago    Mitral regurgitation    Obesity    Pain in joint, upper arm    Primary localized osteoarthrosis, lower leg    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   Substance abuse (HCC)    sober since 2001   Tobacco abuse    Tricuspid regurgitation    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    Past Surgical History:  Procedure Laterality Date    ABDOMINAL HYSTERECTOMY     at age 65, unknown reasons   BIOPSY  04/07/2023   Procedure: BIOPSY;  Surgeon: Lemar Lofty., MD;  Location: [facility];  Service: Gastroenterology;;   Fidela Salisbury RELEASE  12/07/2011   left side   COLONOSCOPY WITH PROPOFOL N/A 07/30/2022   Procedure: COLONOSCOPY WITH PROPOFOL;  Surgeon: Meryl Dare, MD;  Location: [facility] ENDOSCOPY;  Service: Gastroenterology;  Laterality: N/A;   ESOPHAGOGASTRODUODENOSCOPY (EGD) WITH PROPOFOL N/A 04/07/2023   Procedure: ESOPHAGOGASTRODUODENOSCOPY (EGD) WITH PROPOFOL;  Surgeon: Meridee Score Netty Starring., MD;  Location: [facility] ENDOSCOPY;  Service: Gastroenterology;  Laterality: N/A;   EUS N/A 04/07/2023   Procedure: ESOPHAGEAL ENDOSCOPIC ULTRASOUND (EUS) RADIAL;  Surgeon: Meridee Score Netty Starring., MD;  Location: [facility] ENDOSCOPY;  Service: Gastroenterology;  Laterality: N/A;   EYE SURGERY     FINE NEEDLE ASPIRATION N/A 04/07/2023   Procedure: FINE NEEDLE ASPIRATION (FNA) LINEAR;  Surgeon: Lemar Lofty., MD;  Location: [facility] ENDOSCOPY;  Service: Gastroenterology;  Laterality: N/A;   FOOT SURGERY Bilateral    HOT HEMOSTASIS N/A 07/30/2022   Procedure: HOT HEMOSTASIS (ARGON PLASMA COAGULATION/BICAP);  Surgeon: Meryl Dare, MD;  Location: [facility] ENDOSCOPY;  Service: Gastroenterology;  Laterality: N/A;   IR IMAGING GUIDED PORT INSERTION  05/03/2023   MITRAL VALVE REPAIR N/A 04/19/2022   Procedure: MITRAL VALVE REPAIR WITH PHYSIO II ANNULOPLASTY RING;  Surgeon:  Loreli Slot, MD;  Location: [facility] OR;  Service: Open Heart Surgery;  Laterality: N/A;   PERICARDIOCENTESIS N/A 05/10/2022   Procedure: PERICARDIOCENTESIS;  Surgeon: Corky Crafts, MD;  Location: [facility] INVASIVE CV LAB;  Service: Cardiovascular;  Laterality: N/A;   POLYPECTOMY  07/30/2022   Procedure: POLYPECTOMY;  Surgeon: Meryl Dare, MD;  Location: [facility] ENDOSCOPY;  Service: Gastroenterology;;   RIGHT/LEFT HEART CATH AND CORONARY ANGIOGRAPHY N/A 02/12/2022    Procedure: RIGHT/LEFT HEART CATH AND CORONARY ANGIOGRAPHY;  Surgeon: Lyn Records, MD;  Location: [facility] INVASIVE CV LAB;  Service: Cardiovascular;  Laterality: N/A;   TEE WITHOUT CARDIOVERSION N/A 02/03/2022   Procedure: TRANSESOPHAGEAL ECHOCARDIOGRAM (TEE);  Surgeon: Wendall Stade, MD;  Location: [facility] ENDOSCOPY;  Service: Cardiovascular;  Laterality: N/A;   TEE WITHOUT CARDIOVERSION N/A 04/19/2022   Procedure: TRANSESOPHAGEAL ECHOCARDIOGRAM (TEE);  Surgeon: Loreli Slot, MD;  Location: [facility] OR;  Service: Open Heart Surgery;  Laterality: N/A;   TEE WITHOUT CARDIOVERSION N/A 07/28/2022   Procedure: TRANSESOPHAGEAL ECHOCARDIOGRAM (TEE);  Surgeon: Thurmon Fair, MD;  Location: [facility] ENDOSCOPY;  Service: Cardiovascular;  Laterality: N/A;   TRICUSPID VALVE REPLACEMENT N/A 04/19/2022   Procedure: TRICUSPID VALVE REPAIR WITH MC3 ANNULOPLASTY RING;  Surgeon: Loreli Slot, MD;  Location: [facility] OR;  Service: Open Heart Surgery;  Laterality: N/A;    Family History  Problem Relation Age of Onset   Uterine cancer Mother    Alcohol abuse Father    Alcohol abuse Sister    Drug abuse Sister    Drug abuse Brother    Alcohol abuse Brother    Schizophrenia Son    Diabetes Other    Arthritis Other    Hypertension Other    Heart attack Son 83       Died suddenly    Social:  reports that she has been smoking cigarettes. She has a 8.75 pack-year smoking history. She has never used smokeless tobacco. She reports that she does not drink alcohol and does not use drugs.  Allergies:  Allergies  Allergen Reactions   Norco [Hydrocodone-Acetaminophen] Itching and Other (See Comments)    Tolerates Oxycodone    Medications: I have reviewed the patient's current medications.  Results for orders placed or performed during the hospital encounter of 05/15/23 (from the past 48 hour(s))  CBC     Status: Abnormal   Collection Time: 05/15/23  8:26 PM  Result Value Ref Range   WBC 7.7 4.0 - 10.5 K/uL    RBC 3.22 (L) 3.87 - 5.11 MIL/uL   Hemoglobin 10.0 (L) 12.0 - 15.0 g/dL   HCT 16.1 (L) 09.6 - 04.5 %   MCV 93.8 80.0 - 100.0 fL   MCH 31.1 26.0 - 34.0 pg   MCHC 33.1 30.0 - 36.0 g/dL   RDW 40.9 81.1 - 91.4 %   Platelets 165 150 - 400 K/uL   nRBC 0.4 (H) 0.0 - 0.2 %    Comment: Performed at [facility], [address], [place], [place] [address]  Comprehensive metabolic panel     Status: Abnormal   Collection Time: 05/15/23  8:26 PM  Result Value Ref Range   Sodium 129 (L) 135 - 145 mmol/L   Potassium 3.8 3.5 - 5.1 mmol/L   Chloride 97 (L) 98 - 111 mmol/L   CO2 18 (L) 22 - 32 mmol/L   Glucose, Bld 182 (H) 70 - 99 mg/dL    Comment: Glucose reference range applies only to samples taken after  fasting for at least 8 hours.   BUN 17 8 - 23 mg/dL   Creatinine, Ser 1.61 (H) 0.44 - 1.00 mg/dL   Calcium 8.4 (L) 8.9 - 10.3 mg/dL   Total Protein 6.0 (L) 6.5 - 8.1 g/dL   Albumin 3.4 (L) 3.5 - 5.0 g/dL   AST 25 15 - 41 U/L   ALT 32 0 - 44 U/L   Alkaline Phosphatase 127 (H) 38 - 126 U/L   Total Bilirubin 0.6 0.3 - 1.2 mg/dL   GFR, Estimated 29 (L) >60 mL/min    Comment: (NOTE) Calculated using the CKD-EPI Creatinine Equation (2021)    Anion gap 14 5 - 15    Comment: Performed at [facility], [address], [place], [place] [address]  Protime-INR     Status: Abnormal   Collection Time: 05/15/23  8:26 PM  Result Value Ref Range   Prothrombin Time 20.6 (H) 11.4 - 15.2 seconds   INR 1.8 (H) 0.8 - 1.2    Comment: (NOTE) INR goal varies based on device and disease states. Performed at [facility], [address], [place], [place] [address]   Type and screen     Status: None (Preliminary result)   Collection Time: 05/15/23  8:26 PM  Result Value Ref Range   ABO/RH(D) PENDING    Antibody Screen PENDING    Sample Expiration      05/18/2023,2359 Performed at [facility], [address] [place], [place] [address]   Lipase, blood     Status: None   Collection  Time: 05/15/23  8:26 PM  Result Value Ref Range   Lipase 28 11 - 51 U/L    Comment: Performed at [facility], [address] [place], [place] [address]  I-stat chem 8, ed     Status: Abnormal   Collection Time: 05/15/23  8:37 PM  Result Value Ref Range   Sodium 131 (L) 135 - 145 mmol/L   Potassium 3.9 3.5 - 5.1 mmol/L   Chloride 99 98 - 111 mmol/L   BUN 18 8 - 23 mg/dL   Creatinine, Ser 6.21 (H) 0.44 - 1.00 mg/dL   Glucose, Bld 308 (H) 70 - 99 mg/dL    Comment: Glucose reference range applies only to samples taken after fasting for at least 8 hours.   Calcium, Ion 1.10 (L) 1.15 - 1.40 mmol/L   TCO2 20 (L) 22 - 32 mmol/L   Hemoglobin 11.6 (L) 12.0 - 15.0 g/dL   HCT 65.7 (L) 84.6 - 96.2 %    CT Head Wo Contrast  Result Date: 05/15/2023 CLINICAL DATA:  Head trauma, coagulopathy (Age 49-64y) EXAM: CT HEAD WITHOUT CONTRAST TECHNIQUE: Contiguous axial images were obtained from the base of the skull through the vertex without intravenous contrast. RADIATION DOSE REDUCTION: This exam was performed according to the departmental dose-optimization program which includes automated exposure control, adjustment of the mA and/or kV according to patient size and/or use of iterative reconstruction technique. COMPARISON:  Head CT 08/03/2022 FINDINGS: Brain: No intracranial hemorrhage, mass effect, or midline shift. No hydrocephalus. The basilar cisterns are patent. No evidence of territorial infarct or acute ischemia. No extra-axial or intracranial fluid collection. Vascular: No hyperdense vessel or unexpected calcification. Skull: No fracture or focal lesion. Sinuses/Orbits: Paranasal sinuses and mastoid air cells are clear. The visualized orbits are unremarkable. Other: Small right frontal scalp laceration. IMPRESSION: Small right frontal scalp laceration. No acute intracranial abnormality. No skull fracture. Electronically  Signed   By: Narda Rutherford M.D.   On: 05/15/2023 21:18   CT Cervical Spine  Wo Contrast  Result Date: 05/15/2023 CLINICAL DATA:  Neck trauma, dangerous injury mechanism (Age 7-64y) EXAM: CT CERVICAL SPINE WITHOUT CONTRAST TECHNIQUE: Multidetector CT imaging of the cervical spine was performed without intravenous contrast. Multiplanar CT image reconstructions were also generated. RADIATION DOSE REDUCTION: This exam was performed according to the departmental dose-optimization program which includes automated exposure control, adjustment of the mA and/or kV according to patient size and/or use of iterative reconstruction technique. COMPARISON:  None Available. FINDINGS: Alignment: Normal. Skull base and vertebrae: No acute fracture. Vertebral body heights are maintained. The dens and skull base are intact. Soft tissues and spinal canal: No prevertebral fluid or swelling. No visible canal hematoma. Disc levels: Degenerative disc disease with disc space narrowing and spurring. No high-grade canal stenosis Upper chest: Assessed on concurrent chest CT, reported separately. Other: There is air in the venous vasculature. IMPRESSION: Degenerative disc disease without acute fracture or subluxation of the cervical spine. Electronically Signed   By: Narda Rutherford M.D.   On: 05/15/2023 21:16   DG Pelvis Portable  Result Date: 05/15/2023 CLINICAL DATA:  Status post fall. EXAM: PORTABLE PELVIS 1-2 VIEWS COMPARISON:  None Available. FINDINGS: There is no evidence of pelvic fracture or diastasis. No pelvic bone lesions are seen. Ill-defined surgical sutures are seen overlying the midline of the mid pelvis. IMPRESSION: Negative. Electronically Signed   By: Aram Candela M.D.   On: 05/15/2023 21:16   DG Chest Port 1 View  Result Date: 05/15/2023 CLINICAL DATA:  Insert epic diagnosis and indication. Fall. EXAM: PORTABLE CHEST 1 VIEW COMPARISON:  Most recent radiographs chest CT 04/05/2023, radiograph 08/12/2022 FINDINGS: Right chest port in place. Prior median sternotomy. Previous cardiac valve  repair. Stable heart size and mediastinal contours. No pulmonary edema, pleural fluid or pneumothorax. Pulmonary nodules on CT are not well seen by radiograph. On limited assessment, no acute osseous findings. IMPRESSION: 1. No acute abnormality. 2. Pulmonary nodules on CT are not well seen by radiograph. Electronically Signed   By: Narda Rutherford M.D.   On: 05/15/2023 20:48    ROS -all of the below systems have been reviewed with the patient and positives are indicated with bold text General: chills, fever or night sweats Eyes: blurry vision or double vision ENT: epistaxis or sore throat Allergy/Immunology: itchy/watery eyes or nasal congestion Hematologic/Lymphatic: bleeding problems, blood clots or swollen lymph nodes Endocrine: temperature intolerance or unexpected weight changes Breast: new or changing breast lumps or nipple discharge Resp: cough, shortness of breath, or wheezing CV: chest pain or dyspnea on exertion (chronic) GI: as per HPI GU: dysuria, trouble voiding, or hematuria MSK: joint pain or joint stiffness Neuro: TIA or stroke symptoms Derm: pruritus and skin lesion changes Psych: anxiety and depression  PE Blood pressure (!) 82/59, temperature 97.8 F (36.6 C), temperature source Oral, last menstrual period 12/06/1976. Physical Exam Constitutional: NAD; conversant; no deformities Eyes: Moist conjunctiva; no lid lag; anicteric; PERRL Neck: Trachea midline; no thyromegaly Lungs: Normal respiratory effort; CTAB. Apparent port-a-cath right upper chest wall CV: tachycardic rate (HR ~130, appears sinus), reg rhythm; no pitting edema GI: Abd soft, NT/ND MSK: Normal range of motion of extremities; no clubbing/cyanosis; no deformities Psychiatric: Appropriate affect; alert and oriented x3  Results for orders placed or performed during the hospital encounter of 05/15/23 (from the past 48 hour(s))  CBC     Status: Abnormal   Collection Time:  05/15/23  8:26 PM  Result Value  Ref Range   WBC 7.7 4.0 - 10.5 K/uL   RBC 3.22 (L) 3.87 - 5.11 MIL/uL   Hemoglobin 10.0 (L) 12.0 - 15.0 g/dL   HCT 16.1 (L) 09.6 - 04.5 %   MCV 93.8 80.0 - 100.0 fL   MCH 31.1 26.0 - 34.0 pg   MCHC 33.1 30.0 - 36.0 g/dL   RDW 40.9 81.1 - 91.4 %   Platelets 165 150 - 400 K/uL   nRBC 0.4 (H) 0.0 - 0.2 %    Comment: Performed at [facility], [address], [place], [place] [address]  Comprehensive metabolic panel     Status: Abnormal   Collection Time: 05/15/23  8:26 PM  Result Value Ref Range   Sodium 129 (L) 135 - 145 mmol/L   Potassium 3.8 3.5 - 5.1 mmol/L   Chloride 97 (L) 98 - 111 mmol/L   CO2 18 (L) 22 - 32 mmol/L   Glucose, Bld 182 (H) 70 - 99 mg/dL    Comment: Glucose reference range applies only to samples taken after fasting for at least 8 hours.   BUN 17 8 - 23 mg/dL   Creatinine, Ser 6.21 (H) 0.44 - 1.00 mg/dL   Calcium 8.4 (L) 8.9 - 10.3 mg/dL   Total Protein 6.0 (L) 6.5 - 8.1 g/dL   Albumin 3.4 (L) 3.5 - 5.0 g/dL   AST 25 15 - 41 U/L   ALT 32 0 - 44 U/L   Alkaline Phosphatase 127 (H) 38 - 126 U/L   Total Bilirubin 0.6 0.3 - 1.2 mg/dL   GFR, Estimated 29 (L) >60 mL/min    Comment: (NOTE) Calculated using the CKD-EPI Creatinine Equation (2021)    Anion gap 14 5 - 15    Comment: Performed at [facility], [address], [place], [place] [address]  Protime-INR     Status: Abnormal   Collection Time: 05/15/23  8:26 PM  Result Value Ref Range   Prothrombin Time 20.6 (H) 11.4 - 15.2 seconds   INR 1.8 (H) 0.8 - 1.2    Comment: (NOTE) INR goal varies based on device and disease states. Performed at [facility], [address], [place], [place] [address]   Type and screen     Status: None (Preliminary result)   Collection Time: 05/15/23  8:26 PM  Result Value Ref Range   ABO/RH(D) PENDING    Antibody Screen PENDING    Sample Expiration      05/18/2023,2359 Performed at [facility], [address], [place], [place] [address]    Lipase, blood     Status: None   Collection Time: 05/15/23  8:26 PM  Result Value Ref Range   Lipase 28 11 - 51 U/L    Comment: Performed at [facility], [address], [place], [place] [address]  I-stat chem 8, ed     Status: Abnormal   Collection Time: 05/15/23  8:37 PM  Result Value Ref Range   Sodium 131 (L) 135 - 145 mmol/L   Potassium 3.9 3.5 - 5.1 mmol/L   Chloride 99 98 - 111 mmol/L   BUN 18 8 - 23 mg/dL   Creatinine, Ser 4.40 (H) 0.44 - 1.00 mg/dL   Glucose, Bld 102 (H) 70 - 99 mg/dL    Comment: Glucose reference range applies only to samples taken after fasting for at least 8 hours.   Calcium, Ion  1.10 (L) 1.15 - 1.40 mmol/L   TCO2 20 (L) 22 - 32 mmol/L   Hemoglobin 11.6 (L) 12.0 - 15.0 g/dL   HCT 16.1 (L) 09.6 - 04.5 %    CT Head Wo Contrast  Result Date: 05/15/2023 CLINICAL DATA:  Head trauma, coagulopathy (Age 15-64y) EXAM: CT HEAD WITHOUT CONTRAST TECHNIQUE: Contiguous axial images were obtained from the base of the skull through the vertex without intravenous contrast. RADIATION DOSE REDUCTION: This exam was performed according to the departmental dose-optimization program which includes automated exposure control, adjustment of the mA and/or kV according to patient size and/or use of iterative reconstruction technique. COMPARISON:  Head CT 08/03/2022 FINDINGS: Brain: No intracranial hemorrhage, mass effect, or midline shift. No hydrocephalus. The basilar cisterns are patent. No evidence of territorial infarct or acute ischemia. No extra-axial or intracranial fluid collection. Vascular: No hyperdense vessel or unexpected calcification. Skull: No fracture or focal lesion. Sinuses/Orbits: Paranasal sinuses and mastoid air cells are clear. The visualized orbits are unremarkable. Other: Small right frontal scalp laceration. IMPRESSION: Small right frontal scalp laceration. No acute intracranial abnormality. No skull fracture. Electronically Signed   By: Narda Rutherford  M.D.   On: 05/15/2023 21:18   CT Cervical Spine Wo Contrast  Result Date: 05/15/2023 CLINICAL DATA:  Neck trauma, dangerous injury mechanism (Age 25-64y) EXAM: CT CERVICAL SPINE WITHOUT CONTRAST TECHNIQUE: Multidetector CT imaging of the cervical spine was performed without intravenous contrast. Multiplanar CT image reconstructions were also generated. RADIATION DOSE REDUCTION: This exam was performed according to the departmental dose-optimization program which includes automated exposure control, adjustment of the mA and/or kV according to patient size and/or use of iterative reconstruction technique. COMPARISON:  None Available. FINDINGS: Alignment: Normal. Skull base and vertebrae: No acute fracture. Vertebral body heights are maintained. The dens and skull base are intact. Soft tissues and spinal canal: No prevertebral fluid or swelling. No visible canal hematoma. Disc levels: Degenerative disc disease with disc space narrowing and spurring. No high-grade canal stenosis Upper chest: Assessed on concurrent chest CT, reported separately. Other: There is air in the venous vasculature. IMPRESSION: Degenerative disc disease without acute fracture or subluxation of the cervical spine. Electronically Signed   By: Narda Rutherford M.D.   On: 05/15/2023 21:16   DG Pelvis Portable  Result Date: 05/15/2023 CLINICAL DATA:  Status post fall. EXAM: PORTABLE PELVIS 1-2 VIEWS COMPARISON:  None Available. FINDINGS: There is no evidence of pelvic fracture or diastasis. No pelvic bone lesions are seen. Ill-defined surgical sutures are seen overlying the midline of the mid pelvis. IMPRESSION: Negative. Electronically Signed   By: Aram Candela M.D.   On: 05/15/2023 21:16   DG Chest Port 1 View  Result Date: 05/15/2023 CLINICAL DATA:  Insert epic diagnosis and indication. Fall. EXAM: PORTABLE CHEST 1 VIEW COMPARISON:  Most recent radiographs chest CT 04/05/2023, radiograph 08/12/2022 FINDINGS: Right chest port in  place. Prior median sternotomy. Previous cardiac valve repair. Stable heart size and mediastinal contours. No pulmonary edema, pleural fluid or pneumothorax. Pulmonary nodules on CT are not well seen by radiograph. On limited assessment, no acute osseous findings. IMPRESSION: 1. No acute abnormality. 2. Pulmonary nodules on CT are not well seen by radiograph. Electronically Signed   By: Narda Rutherford M.D.   On: 05/15/2023 20:48      Assessment/Plan: 62yoF with known stage IV pancreatic adenocarcinoma (diagnosed May 2024 now transitioned to residential hospice), rheumatic heart disease s/p repair May 2023, pericardial effusion/tamponade s/p pericardiocentesis June 2023, PAF on Coumadin,  CKD 3A, COPD, DM II, HTN - here following apparent syncopal event then fall  Syncopal event - unclear etiology - medicine evaluation for further workup if in line with goals of care 2 cm superficial forehead lac - repair per EDP Dr. Denton Lank  We will remain available to assist in her care if needed - please call us if any concerns  I spent a total of 65 minutes in both face-to-face and non-face-to-face activities, excluding procedures performed, for this visit on the date of this encounter.  Marin Olp, MD [facility], [facility]

## 2023-05-15 NOTE — ED Notes (Signed)
Visitor at bedside.  Patient pleasant, discussing her faith and end of life planning with visitor.  This RN provided emotional support.

## 2023-05-15 NOTE — ED Notes (Signed)
EDP at bedside for suturing °

## 2023-05-15 NOTE — ED Notes (Signed)
Trauma Response Nurse Documentation   [name] is a 62 y.o. female arriving to [facility] ED via [facility]  On warfarin daily. Trauma was activated as a Level 1 by [place], Consulting civil engineer based on the following trauma criteria Anytime Systolic Blood Pressure < 90.  Patient cleared for CT by Dr. Cliffton Asters. Pt transported to CT with trauma response nurse present to monitor. RN remained with the patient throughout their absence from the department for clinical observation.   GCS 15.  History   Past Medical History:  Diagnosis Date   Acute pericardial effusion 05/17/2022   AKI (acute kidney injury) (HCC) 05/17/2022   Anxiety    Arthritis    Cervicalgia    CHF (congestive heart failure) (HCC)    Chondromalacia of patella    Chronic neck pain    Chronic pain syndrome    Depression    secondary to loss of her son at age 66   Diabetic neuropathy (HCC)    DMII (diabetes mellitus, type 2) (HCC)    Dysrhythmia    Enthesopathy of hip region    Fibromyalgia    GERD (gastroesophageal reflux disease)    Hep C w/o coma, chronic (HCC)    Hepatitis C    diagnosed 2005   Hypertension    Insomnia    Low back pain    Lumbago    Mitral regurgitation    Obesity    Pain in joint, upper arm    Primary localized osteoarthrosis, lower leg    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   Substance abuse (HCC)    sober since 2001   Tobacco abuse    Tricuspid regurgitation    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     Past Surgical History:  Procedure Laterality Date   ABDOMINAL HYSTERECTOMY     at age 31, unknown reasons   BIOPSY  04/07/2023   Procedure: BIOPSY;  Surgeon: Lemar Lofty., MD;  Location: [facility];  Service: Gastroenterology;;   Fidela Salisbury RELEASE  12/07/2011   left side   COLONOSCOPY WITH PROPOFOL N/A 07/30/2022   Procedure: COLONOSCOPY WITH PROPOFOL;  Surgeon: Meryl Dare, MD;  Location: [facility] ENDOSCOPY;  Service: Gastroenterology;   Laterality: N/A;   ESOPHAGOGASTRODUODENOSCOPY (EGD) WITH PROPOFOL N/A 04/07/2023   Procedure: ESOPHAGOGASTRODUODENOSCOPY (EGD) WITH PROPOFOL;  Surgeon: Meridee Score Netty Starring., MD;  Location: [facility] ENDOSCOPY;  Service: Gastroenterology;  Laterality: N/A;   EUS N/A 04/07/2023   Procedure: ESOPHAGEAL ENDOSCOPIC ULTRASOUND (EUS) RADIAL;  Surgeon: Meridee Score Netty Starring., MD;  Location: [facility] ENDOSCOPY;  Service: Gastroenterology;  Laterality: N/A;   EYE SURGERY     FINE NEEDLE ASPIRATION N/A 04/07/2023   Procedure: FINE NEEDLE ASPIRATION (FNA) LINEAR;  Surgeon: Lemar Lofty., MD;  Location: [facility] ENDOSCOPY;  Service: Gastroenterology;  Laterality: N/A;   FOOT SURGERY Bilateral    HOT HEMOSTASIS N/A 07/30/2022   Procedure: HOT HEMOSTASIS (ARGON PLASMA COAGULATION/BICAP);  Surgeon: Meryl Dare, MD;  Location: [facility] ENDOSCOPY;  Service: Gastroenterology;  Laterality: N/A;   IR IMAGING GUIDED PORT INSERTION  05/03/2023   MITRAL VALVE REPAIR N/A 04/19/2022   Procedure: MITRAL VALVE REPAIR WITH PHYSIO II ANNULOPLASTY RING;  Surgeon: Loreli Slot, MD;  Location: [facility] OR;  Service: Open Heart Surgery;  Laterality: N/A;   PERICARDIOCENTESIS N/A 05/10/2022   Procedure: PERICARDIOCENTESIS;  Surgeon: Corky Crafts, MD;  Location: [facility] INVASIVE CV LAB;  Service: Cardiovascular;  Laterality: N/A;   POLYPECTOMY  07/30/2022   Procedure:  POLYPECTOMY;  Surgeon: Meryl Dare, MD;  Location: [facility] ENDOSCOPY;  Service: Gastroenterology;;   RIGHT/LEFT HEART CATH AND CORONARY ANGIOGRAPHY N/A 02/12/2022   Procedure: RIGHT/LEFT HEART CATH AND CORONARY ANGIOGRAPHY;  Surgeon: Lyn Records, MD;  Location: [facility] INVASIVE CV LAB;  Service: Cardiovascular;  Laterality: N/A;   TEE WITHOUT CARDIOVERSION N/A 02/03/2022   Procedure: TRANSESOPHAGEAL ECHOCARDIOGRAM (TEE);  Surgeon: Wendall Stade, MD;  Location: [facility] ENDOSCOPY;  Service: Cardiovascular;  Laterality: N/A;   TEE WITHOUT CARDIOVERSION N/A 04/19/2022   Procedure:  TRANSESOPHAGEAL ECHOCARDIOGRAM (TEE);  Surgeon: Loreli Slot, MD;  Location: [facility] OR;  Service: Open Heart Surgery;  Laterality: N/A;   TEE WITHOUT CARDIOVERSION N/A 07/28/2022   Procedure: TRANSESOPHAGEAL ECHOCARDIOGRAM (TEE);  Surgeon: Thurmon Fair, MD;  Location: [facility] ENDOSCOPY;  Service: Cardiovascular;  Laterality: N/A;   TRICUSPID VALVE REPLACEMENT N/A 04/19/2022   Procedure: TRICUSPID VALVE REPAIR WITH MC3 ANNULOPLASTY RING;  Surgeon: Loreli Slot, MD;  Location: [facility] OR;  Service: Open Heart Surgery;  Laterality: N/A;       Initial Focused Assessment (If applicable, or please see trauma documentation): Airway-- intact, no visible obstruction Breathing-- spontaneous, unlabored Circulation-- small laceration to right side of forehead, bleeding controlled on arrival to department.  CT's Completed:   CT Head, CT C-Spine, CT Chest w/ contrast, and CT abdomen/pelvis w/ contrast   Interventions:  See event summary  Plan for disposition:  Admission to floor   Consults completed:  none at 2331.  Event Summary: Patient brought in by [facility] from home. Patient had a syncopal event this evening causing her to fall and strike the front of her head. Per EMS patient tachy in the 150s, hypotensive at 90. On initial encode patient was alert and oriented, enroute per EMS patient becoming less responsive. On arrival to department, patient alert and oriented x4, GCS 15. Manual BP in the low 80s. Warmed NS administered. Patient undressed. Only injury noted on assessement is small laceration to right side of patients forehead, bleeding controlled on arrival. Trauma labs obtained. Xray chest and pelvis completed. CT head, c-spine, chest/abdomen/pelvis completed.  MTP Summary (If applicable):  N/A  Bedside handoff with ED RN Fleet Contras.    Leota Sauers  Trauma Response RN  Please call TRN at [phone] for further assistance.

## 2023-05-15 NOTE — ED Provider Notes (Signed)
[place] EMERGENCY DEPARTMENT AT [facility] Provider Note   CSN: [id number] Arrival date & time: 05/15/23  2020     History  Chief Complaint  Patient presents with   Marletta Lor    [name] is a 62 y.o. female.  Patient presents s/p fall. Pt has been feeling generally weak, and occasionally faint when standing since recent hospital admission and discharge (history metastatic pancreatic cancer, recently transitioned to hospice care, recent admission with uti/dehydration).  Pt had stood, was walking, felt faint, and had syncopal episode. Contusion to face/forehead, lac to area. Indicates tetanus up to date within past 10 years. No seizure activity noted. EMS notes bp in 90s. Pt indicates has been having all over body pain - denies focal pain. Dull pain to head. No neck/back pain. No chest pain. No sob. No vomiting. Decreased appetite. No focal extremity pain or injury. Hx afib. On coumadin.   The history is provided by the patient, medical records and the EMS personnel. The history is limited by the condition of the patient.  Fall Pertinent negatives include no chest pain and no shortness of breath.       Home Medications Prior to Admission medications   Medication Sig Start Date End Date Taking? Authorizing Provider  alum & mag hydroxide-simeth (MAALOX/MYLANTA) 200-200-20 MG/5ML suspension Take 30 mLs by mouth every 4 (four) hours as needed for indigestion. 05/06/23   Glade Lloyd, MD  ARIPiprazole (ABILIFY) 5 MG tablet Take 2.5 mg by mouth daily. 07/20/22   [provider]  Blood Glucose Monitoring Suppl DEVI 1 each by Does not apply route in the morning, at noon, and at bedtime. May substitute to any manufacturer covered by patient's insurance. 04/09/23   Glade Lloyd, MD  buPROPion (WELLBUTRIN XL) 300 MG 24 hr tablet Take 300 mg by mouth in the morning. 03/24/22   [provider]  dexlansoprazole (DEXILANT) 60 MG capsule Take 60 mg by mouth daily.  07/26/22   [provider]  diclofenac Sodium (VOLTAREN) 1 % GEL Apply 2 g topically 4 (four) times daily as needed (pain). 06/29/17   [provider]  gabapentin (NEURONTIN) 300 MG capsule Take 300 mg by mouth 3 (three) times daily. 08/19/22   [provider]  Glucose Blood (BLOOD GLUCOSE TEST STRIPS) STRP 1 each by In Vitro route in the morning, at noon, and at bedtime. May substitute to any manufacturer covered by patient's insurance. 04/09/23   Glade Lloyd, MD  hydrALAZINE (APRESOLINE) 25 MG tablet Take 1 tablet (25 mg total) by mouth daily as needed (BP>150). 04/26/23   Chandrasekhar, Mahesh A, MD  insulin glargine (LANTUS) 100 UNIT/ML Solostar Pen Inject 15 Units into the skin daily. 04/09/23   Glade Lloyd, MD  Insulin Pen Needle 31G X 5 MM MISC Lantus 15 units daily 04/09/23   Glade Lloyd, MD  linagliptin (TRADJENTA) 5 MG TABS tablet Take 5 mg by mouth daily.    [provider]  metoprolol tartrate (LOPRESSOR) 25 MG tablet Take 0.5 tablets (12.5 mg total) by mouth 2 (two) times daily. 05/13/23   Lewie Chamber, MD  naloxone [facility]) nasal spray 4 mg/0.1 mL PRN for suspected opiate OD 04/24/23   Glyn Ade, MD  oxyCODONE ER [facility]) 36 MG C12A Take 1 capsule (36 mg total) by mouth every 8 (eight) hours. 05/14/23     Oxycodone HCl 10 MG TABS Take 1-2 tablets (10-20 mg total) by mouth every 4 (four) hours as needed. 05/13/23 05/12/24  Lewie Chamber,  MD  Oxycodone HCl 10 MG TABS Take 1-2 tablets (10-20 mg total) by mouth every 3 (three) hours as directed for pain. 05/14/23     Oyster Shell (OYSTER CALCIUM) 500 MG TABS tablet Take 500 mg of elemental calcium by mouth daily.    [provider]  polyethylene glycol powder (GLYCOLAX/MIRALAX) 17 GM/SCOOP powder Take 17 g by mouth daily. 05/07/23   Glade Lloyd, MD  PREMARIN 0.45 MG tablet Take 0.45 mg by mouth daily. 01/11/22   [provider]  QUEtiapine (SEROQUEL) 25 MG tablet Take 25 mg by mouth as  needed. 03/17/23   [provider]  senna (SENOKOT) 8.6 MG TABS tablet Take 2 tablets (17.2 mg total) by mouth daily. 05/07/23   Glade Lloyd, MD  tobramycin-dexamethasone [facility]) ophthalmic solution 1 drop 4 (four) times daily. 04/14/23   [provider]  traZODone (DESYREL) 100 MG tablet Take 200 mg by mouth at bedtime. Pt takes 2 tablets 200 mg at bedtime.    [provider]  TRINTELLIX 10 MG TABS tablet Take 10 mg by mouth daily. 10/22/22   [provider]  warfarin (COUMADIN) 5 MG tablet Take 5 mg by mouth as directed. Take 5mg  po daily, except 2.5mg  on Sunday.    [provider]      Allergies    Norco [hydrocodone-acetaminophen]    Review of Systems   Review of Systems  Constitutional:  Negative for chills and fever.  HENT:  Negative for nosebleeds and sore throat.   Eyes:  Negative for pain, redness and visual disturbance.  Respiratory:  Negative for cough and shortness of breath.   Cardiovascular:  Negative for chest pain.  Gastrointestinal:  Negative for vomiting.  Genitourinary:  Negative for dysuria and flank pain.  Musculoskeletal:  Negative for back pain and neck pain.  Skin:  Positive for wound.  Neurological:  Negative for speech difficulty and numbness.  Psychiatric/Behavioral:  Negative for confusion.     Physical Exam Updated Vital Signs BP 95/82   Pulse (!) 38   Temp 97.8 F (36.6 C) (Oral)   Resp 16   LMP 12/06/1976   SpO2 98%  Physical Exam Vitals and nursing note reviewed.  Constitutional:      Appearance: Normal appearance. She is well-developed.  HENT:     Head:     Comments: Contusion and 1 cm lac to right forehead.     Nose: Nose normal.     Mouth/Throat:     Mouth: Mucous membranes are moist.  Eyes:     General: No scleral icterus.    Conjunctiva/sclera: Conjunctivae normal.     Pupils: Pupils are equal, round, and reactive to light.  Neck:     Vascular: No carotid bruit.     Trachea: No  tracheal deviation.     Comments: No stiffness or rigidity.  Cardiovascular:     Rate and Rhythm: Normal rate and regular rhythm.     Pulses: Normal pulses.     Heart sounds: Normal heart sounds. No murmur heard.    No friction rub. No gallop.  Pulmonary:     Effort: Pulmonary effort is normal. No respiratory distress.     Breath sounds: Normal breath sounds.     Comments: Port right chest without sign of infection.  Chest:     Chest wall: No tenderness.  Abdominal:     General: Bowel sounds are normal. There is no distension.     Palpations: Abdomen is soft.  Tenderness: There is no abdominal tenderness.  Genitourinary:    Comments: No cva tenderness.  Musculoskeletal:        General: No swelling.     Cervical back: Normal range of motion and neck supple. No rigidity or tenderness. No muscular tenderness.     Comments: ?mild mid cervical tenderness, otherwise, CTLS spine, non tender, aligned, no step off. Good rom bil extremities without pain or focal bony tenderness.   Skin:    General: Skin is warm and dry.     Findings: No rash.  Neurological:     Mental Status: She is alert.     Comments: Alert, speech normal. Gcs 15. Motor/sens grossly intact bil.   Psychiatric:        Mood and Affect: Mood normal.     ED Results / Procedures / Treatments   Labs (all labs ordered are listed, but only abnormal results are displayed) Results for orders placed or performed during the hospital encounter of 05/15/23  CBC  Result Value Ref Range   WBC 7.7 4.0 - 10.5 K/uL   RBC 3.22 (L) 3.87 - 5.11 MIL/uL   Hemoglobin 10.0 (L) 12.0 - 15.0 g/dL   HCT 16.1 (L) 09.6 - 04.5 %   MCV 93.8 80.0 - 100.0 fL   MCH 31.1 26.0 - 34.0 pg   MCHC 33.1 30.0 - 36.0 g/dL   RDW 40.9 81.1 - 91.4 %   Platelets 165 150 - 400 K/uL   nRBC 0.4 (H) 0.0 - 0.2 %  Comprehensive metabolic panel  Result Value Ref Range   Sodium 129 (L) 135 - 145 mmol/L   Potassium 3.8 3.5 - 5.1 mmol/L   Chloride 97 (L) 98 -  111 mmol/L   CO2 18 (L) 22 - 32 mmol/L   Glucose, Bld 182 (H) 70 - 99 mg/dL   BUN 17 8 - 23 mg/dL   Creatinine, Ser 7.82 (H) 0.44 - 1.00 mg/dL   Calcium 8.4 (L) 8.9 - 10.3 mg/dL   Total Protein 6.0 (L) 6.5 - 8.1 g/dL   Albumin 3.4 (L) 3.5 - 5.0 g/dL   AST 25 15 - 41 U/L   ALT 32 0 - 44 U/L   Alkaline Phosphatase 127 (H) 38 - 126 U/L   Total Bilirubin 0.6 0.3 - 1.2 mg/dL   GFR, Estimated 29 (L) >60 mL/min   Anion gap 14 5 - 15  Protime-INR  Result Value Ref Range   Prothrombin Time 20.6 (H) 11.4 - 15.2 seconds   INR 1.8 (H) 0.8 - 1.2  Lipase, blood  Result Value Ref Range   Lipase 28 11 - 51 U/L  Lactic acid, plasma  Result Value Ref Range   Lactic Acid, Venous 2.5 (HH) 0.5 - 1.9 mmol/L  I-stat chem 8, ed  Result Value Ref Range   Sodium 131 (L) 135 - 145 mmol/L   Potassium 3.9 3.5 - 5.1 mmol/L   Chloride 99 98 - 111 mmol/L   BUN 18 8 - 23 mg/dL   Creatinine, Ser 9.56 (H) 0.44 - 1.00 mg/dL   Glucose, Bld 213 (H) 70 - 99 mg/dL   Calcium, Ion 0.86 (L) 1.15 - 1.40 mmol/L   TCO2 20 (L) 22 - 32 mmol/L   Hemoglobin 11.6 (L) 12.0 - 15.0 g/dL   HCT 57.8 (L) 46.9 - 62.9 %  Type and screen  Result Value Ref Range   ABO/RH(D) B POS    Antibody Screen NEG    Sample Expiration  05/18/2023,2359 Performed at [facility], [address], [place], [place] [address]    CT CHEST ABDOMEN PELVIS W CONTRAST  Result Date: 05/15/2023 CLINICAL DATA:  Polytrauma, blunt EXAM: CT CHEST, ABDOMEN, AND PELVIS WITH CONTRAST TECHNIQUE: Multidetector CT imaging of the chest, abdomen and pelvis was performed following the standard protocol during bolus administration of intravenous contrast. RADIATION DOSE REDUCTION: This exam was performed according to the departmental dose-optimization program which includes automated exposure control, adjustment of the mA and/or kV according to patient size and/or use of iterative reconstruction technique. CONTRAST:  75mL OMNIPAQUE IOHEXOL 350 MG/ML SOLN  COMPARISON:  Abdominopelvic CT 5 days ago 05/10/2023. Chest CT 04/05/2023 FINDINGS: CT CHEST FINDINGS Cardiovascular: No evidence of acute vascular or aortic injury. Aortic atherosclerosis. Right chest port tip in the SVC. The heart is normal in size. Prosthetic mitral and tricuspid valves. No pericardial effusion. There is no central pulmonary embolus. Mediastinum/Nodes: No mediastinal hemorrhage. No mediastinal adenopathy. Patulous esophagus but no wall thickening. Lungs/Pleura: No pneumothorax. No evidence of pulmonary contusion. No pleural fluid. Again seen scattered pulmonary nodules. Reference right lower lobe nodule measures 9 x 5 mm, series 5, image 123 unchanged. Trachea and central airways are clear. Musculoskeletal: Prior median sternotomy. No acute fracture of the sternum, ribs, included clavicles or shoulder girdles. Thoracic degenerative change without acute fracture. There is no evidence of focal bone lesion. CT ABDOMEN PELVIS FINDINGS Hepatobiliary: No evidence of hepatic injury or perihepatic hematoma. Known hepatic metastatic disease is not significantly changed over the last 5 days. Multiple hypodense liver lesions. The gallbladder is nondistended. Periportal adenopathy is causing mass effect on the main portal vein. Pancreas: Hypodense pancreatic body and tail mass is unchanged from recent exam. There is no evidence of pancreatic injury or inflammation. Peripancreatic adenopathy. Spleen: No splenic injury or perisplenic hematoma. Adrenals/Urinary Tract: No adrenal nodule. No evidence of renal injury or focal renal abnormality. No evidence of bladder injury. There is diminished air within the bladder wall. Persistent but decreased perivesicular air anteriorly. Stomach/Bowel: No evidence of bowel or mesenteric injury. No bowel or gastric outlet obstruction. No bowel inflammation. Small to moderate volume of colonic stool. Vascular/Lymphatic: There is mass effect on the main portal vein from  peripancreatic/portacaval adenopathy. The adenopathy was present on prior, however mass effect is new. Pancreatic/periportal node measures 18 mm, series 8, image 8. Additional prominent portal caval and periportal nodes. 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 Aortic atherosclerosis. No evidence of vascular injury. Reproductive: Hysterectomy without adnexal mass. Other: Persistent but diminished perivesicular air anterior to the bladder. No free fluid or hemorrhage. Musculoskeletal: No acute fracture of the pelvis or lumbar spine. No evidence of focal bone lesion. IMPRESSION: 1. No evidence of acute traumatic injury to the chest, abdomen, or pelvis. 2. Known pancreatic malignancy without significant change over the last 5 days. 3. Known hepatic metastatic disease is not significantly changed. Periportal adenopathy is causing mass effect on the main portal vein. 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 4. Unchanged pulmonary nodules. 5. Persistent but diminished perivesicular air. Aortic Atherosclerosis (ICD10-I70.0). Electronically Signed   By: Narda Rutherford M.D.   On: 05/15/2023 21:27   CT Head Wo Contrast  Result Date: 05/15/2023 CLINICAL DATA:  Head trauma, coagulopathy (Age 15-64y) EXAM: CT HEAD WITHOUT CONTRAST TECHNIQUE: Contiguous axial images were obtained from the base of the skull through the vertex without intravenous contrast. RADIATION DOSE REDUCTION: This exam was performed according to the departmental dose-optimization program which  includes automated exposure control, adjustment of the mA and/or kV according to patient size and/or use of iterative reconstruction technique. COMPARISON:  Head CT 08/03/2022 FINDINGS: Brain: No intracranial hemorrhage, mass effect, or midline shift. No hydrocephalus. The basilar cisterns are patent. No evidence of territorial infarct or acute ischemia. No extra-axial or intracranial fluid collection. Vascular: No hyperdense vessel or unexpected  calcification. Skull: No fracture or focal lesion. Sinuses/Orbits: Paranasal sinuses and mastoid air cells are clear. The visualized orbits are unremarkable. Other: Small right frontal scalp laceration. IMPRESSION: Small right frontal scalp laceration. No acute intracranial abnormality. No skull fracture. Electronically Signed   By: Narda Rutherford M.D.   On: 05/15/2023 21:18   CT Cervical Spine Wo Contrast  Result Date: 05/15/2023 CLINICAL DATA:  Neck trauma, dangerous injury mechanism (Age 62-64y) EXAM: CT CERVICAL SPINE WITHOUT CONTRAST TECHNIQUE: Multidetector CT imaging of the cervical spine was performed without intravenous contrast. Multiplanar CT image reconstructions were also generated. RADIATION DOSE REDUCTION: This exam was performed according to the departmental dose-optimization program which includes automated exposure control, adjustment of the mA and/or kV according to patient size and/or use of iterative reconstruction technique. COMPARISON:  None Available. FINDINGS: Alignment: Normal. Skull base and vertebrae: No acute fracture. Vertebral body heights are maintained. The dens and skull base are intact. Soft tissues and spinal canal: No prevertebral fluid or swelling. No visible canal hematoma. Disc levels: Degenerative disc disease with disc space narrowing and spurring. No high-grade canal stenosis Upper chest: Assessed on concurrent chest CT, reported separately. Other: There is air in the venous vasculature. IMPRESSION: Degenerative disc disease without acute fracture or subluxation of the cervical spine. Electronically Signed   By: Narda Rutherford M.D.   On: 05/15/2023 21:16   DG Pelvis Portable  Result Date: 05/15/2023 CLINICAL DATA:  Status post fall. EXAM: PORTABLE PELVIS 1-2 VIEWS COMPARISON:  None Available. FINDINGS: There is no evidence of pelvic fracture or diastasis. No pelvic bone lesions are seen. Ill-defined surgical sutures are seen overlying the midline of the mid  pelvis. IMPRESSION: Negative. Electronically Signed   By: Aram Candela M.D.   On: 05/15/2023 21:16   DG Chest Port 1 View  Result Date: 05/15/2023 CLINICAL DATA:  Insert epic diagnosis and indication. Fall. EXAM: PORTABLE CHEST 1 VIEW COMPARISON:  Most recent radiographs chest CT 04/05/2023, radiograph 08/12/2022 FINDINGS: Right chest port in place. Prior median sternotomy. Previous cardiac valve repair. Stable heart size and mediastinal contours. No pulmonary edema, pleural fluid or pneumothorax. Pulmonary nodules on CT are not well seen by radiograph. On limited assessment, no acute osseous findings. IMPRESSION: 1. No acute abnormality. 2. Pulmonary nodules on CT are not well seen by radiograph. Electronically Signed   By: Narda Rutherford M.D.   On: 05/15/2023 20:48   CT ABDOMEN PELVIS W CONTRAST  Result Date: 05/10/2023 CLINICAL DATA:  Abdominal pain for several weeks, initial encounter EXAM: CT ABDOMEN AND PELVIS WITH CONTRAST TECHNIQUE: Multidetector CT imaging of the abdomen and pelvis was performed using the standard protocol following bolus administration of intravenous contrast. RADIATION DOSE REDUCTION: This exam was performed according to the departmental dose-optimization program which includes automated exposure control, adjustment of the mA and/or kV according to patient size and/or use of iterative reconstruction technique. CONTRAST:  OMNIPAQUE IOHEXOL 300 MG/ML  SOLN COMPARISON:  04/24/2023 FINDINGS: Lower chest: Lung bases show no focal infiltrate or sizable effusion. A pleural based nodule is noted which measures approximately 6 mm which is stable from the prior exam. Hepatobiliary:  Multiple peripherally enhancing lesions are identified throughout the liver similar to that seen on prior CT examination consistent with metastatic disease. The gallbladder is decompressed. Pancreas: Pancreas again demonstrates a predominately hypodense mass lesion within the body and tail of the  pancreas similar to that seen on prior CT examination consistent with the known pancreatic malignancy. Spleen: Normal in size without focal abnormality. Adrenals/Urinary Tract: Adrenal glands are within normal limits. Kidneys demonstrate a normal enhancement pattern bilaterally. Normal excretion is noted. No renal calculi or obstructive changes are seen. The bladder is decompressed. There is air in within the anterior aspect of the bladder wall suspicious for emphysematous cystitis. Correlate with urinalysis. These changes are new from the prior exam. Stomach/Bowel: No obstructive or inflammatory changes of the colon are seen. The appendix is not well visualized and may have been surgically removed. No inflammatory changes are seen. Small bowel and stomach are unremarkable. Vascular/Lymphatic: Atherosclerotic calcifications of the abdominal aorta are noted. No aneurysmal dilatation is seen. There is again noted encasement of the celiac axis as well as a fat plane adjacent to the superior mesenteric artery consistent with localized neoplastic invasion. 18 mm short axis node is noted in the portacaval space stable from the prior exam. Splenic vein occlusion is not again seen with some collateral flow identified. Reproductive: Status post hysterectomy. No adnexal masses. Other: No abdominal wall hernia or abnormality. No abdominopelvic ascites. Musculoskeletal: Degenerative changes of lumbar spine are seen. IMPRESSION: Changes consistent with the known history of pancreatic neoplasm stable in appearance from the recent exam. Associated vascular encasement is noted as well as splenic vein occlusion and adjacent portacaval adenopathy. Hepatic metastatic disease is again identified and stable. Stable 6 mm subpleural nodule in the right lower lobe. This is stable dating back to January of 2022 and likely of a benign etiology. No acute abnormality is noted. Electronically Signed   By: Alcide Clever M.D.   On: 05/10/2023  19:26   CT CELIAC PLEXUS BLOCK NEUROLYTIC  Result Date: 05/04/2023 CLINICAL DATA:  Refractory abdominal pain secondary to metastatic pancreatic carcinoma. The patient presents for celiac block with planned neurolysis of the celiac plexus. EXAM: CT GUIDED NEUROLYTIC ABLATION OF THE CELIAC AXIS ANESTHESIA/SEDATION: Moderate (conscious) sedation was employed during this procedure. A total of Versed 2.0 mg and Fentanyl 100 mcg was administered intravenously. Moderate Sedation Time: 23 minutes. The patient's level of consciousness and vital signs were monitored continuously by radiology nursing throughout the procedure under my direct supervision. PROCEDURE: The procedure risks, benefits, and alternatives were explained to the patient. Questions regarding the procedure were encouraged and answered. The patient understands and consents to the procedure. A time-out was performed prior to initiating the procedure. CT was performed in a supine position through the upper to mid abdomen. The anterior abdominal wall was prepped with chlorhexidine in a sterile fashion, and a sterile drape was applied covering the operative field. A sterile gown and sterile gloves were used for the procedure. Local anesthesia was provided with 1% Lidocaine. A 22 gauge Chiba needle was advanced under CT guidance to the level of the celiac plexus. After confirming needle tip position, approximately three ml of a 1:10 dilution of Omnipaque-300 contrast and saline was injected. Spread of diluted contrast material was confirmed by CT. A mixture of 5 ml of 0.5% Sensorcaine and 80 mg Depo-Medrol was then made. This was injected through the needle. Alcohol neurolytic ablation of the celiac plexus was then performed with injection of 20 ml of absolute alcohol.  COMPLICATIONS: None FINDINGS: Contrast injection showed excellent spread across the midline at the level of the celiac plexus. Alcohol ablation was successfully performed. IMPRESSION: CT guided  neurolytic ablation of the celiac plexus as above. Alcohol ablation was performed with 20 ml of absolute alcohol. Therapeutic injection of Sensorcaine and Depo-Medrol was also performed just prior to alcohol ablation. Electronically Signed   By: Irish Lack M.D.   On: 05/04/2023 14:17   US BIOPSY (LIVER)  Result Date: 05/03/2023 INDICATION: 62 year old with pancreatic mass and liver lesions. Probable metastatic pancreatic cancer. Port-A-Cath was placed immediately prior to this procedure. EXAM: ULTRASOUND-GUIDED LIVER LESION BIOPSY MEDICATIONS: Moderate sedation ANESTHESIA/SEDATION: Moderate (conscious) sedation was employed during this procedure. A total of Versed 1 mg was administered intravenously by the radiology nurse. Additional Versed and fentanyl was given immediately prior to this procedure for the Port-A-Cath placement. Total intra-service moderate Sedation Time: 20 minutes. The patient's level of consciousness and vital signs were monitored continuously by radiology nursing throughout the procedure under my direct supervision. FLUOROSCOPY TIME:  None COMPLICATIONS: None immediate. PROCEDURE: Informed written consent was obtained from the patient after a thorough discussion of the procedural risks, benefits and alternatives. All questions were addressed. Maximal Sterile Barrier Technique was utilized including caps, mask, sterile gowns, sterile gloves, sterile drape, hand hygiene and skin antiseptic. A timeout was performed prior to the initiation of the procedure. Ultrasound demonstrated subtle hypoechoic liver lesions. Right hepatic lesion was targeted. Right side of the abdomen was prepped with chlorhexidine and sterile field was created. Skin was anesthetized using 1% lidocaine. Small incision was made. Using ultrasound guidance, 17 gauge coaxial needle was directed into the right hepatic lobe and a hypoechoic lesion. Three core biopsies were obtained with an 18 gauge core device. Specimens  placed in formalin. 17 gauge needle was removed without complication. Bandage placed over the puncture site. FINDINGS: Subtle hypoechoic lesions in the liver. Core biopsy needle was confirmed within a small right hepatic lesion. No immediate bleeding or hematoma formation. IMPRESSION: Ultrasound-guided core biopsy of a right hepatic lesion. Electronically Signed   By: Richarda Overlie M.D.   On: 05/03/2023 21:38   IR IMAGING GUIDED PORT INSERTION  Result Date: 05/03/2023 INDICATION: 62 year old with pancreatic mass and liver lesions. Probable metastatic pancreatic cancer. Plan for placement of Port-A-Cath and liver lesion biopsy. EXAM: FLUOROSCOPIC AND ULTRASOUND GUIDED PLACEMENT OF A SUBCUTANEOUS PORT COMPARISON:  None Available. MEDICATIONS: Moderate sedation ANESTHESIA/SEDATION: Moderate (conscious) sedation was employed during this procedure. A total of Versed 1.75 mg and fentanyl 100 mcg was administered intravenously at the order of the provider performing the procedure. Total intra-service moderate sedation time: 29 minutes. Patient's level of consciousness and vital signs were monitored continuously by radiology nurse throughout the procedure under the supervision of the provider performing the procedure. FLUOROSCOPY TIME:  Radiation Exposure Index (as provided by the fluoroscopic device): 1 mGy Kerma COMPLICATIONS: None immediate. PROCEDURE: The procedure, risks, benefits, and alternatives were explained to the patient. Questions regarding the procedure were encouraged and answered. The patient understands and consents to the procedure. Patient was placed supine on the interventional table. Ultrasound confirmed a patent right internal jugular vein. Ultrasound image was saved for documentation. The right chest and neck were cleaned with a skin antiseptic and a sterile drape was placed. Maximal barrier sterile technique was utilized including caps, mask, sterile gowns, sterile gloves, sterile drape, hand  hygiene and skin antiseptic. The right neck was anesthetized with 1% lidocaine. Small incision was made in the right neck  with a blade. Micropuncture set was placed in the right internal jugular vein with ultrasound guidance. The micropuncture wire was used for measurement purposes. The right chest was anesthetized with 1% lidocaine with epinephrine. #15 blade was used to make an incision and a subcutaneous port pocket was formed. 8 french Power Port was assembled. Subcutaneous tunnel was formed with a stiff tunneling device. The port catheter was brought through the subcutaneous tunnel. The port was placed in the subcutaneous pocket. The micropuncture set was exchanged for a peel-away sheath. The catheter was placed through the peel-away sheath and the tip was positioned at the superior cavoatrial junction. Catheter placement was confirmed with fluoroscopy. The port was accessed and flushed with saline. The port pocket was closed using two layers of absorbable sutures and Dermabond. The vein skin site was closed using a single layer of absorbable suture and Dermabond. Sterile dressings were applied. Patient tolerated the procedure well without an immediate complication. Ultrasound and fluoroscopic images were taken and saved for this procedure. IMPRESSION: Placement of a subcutaneous power-injectable port device. Catheter tip at the superior cavoatrial junction. Electronically Signed   By: Richarda Overlie M.D.   On: 05/03/2023 21:35   CT ABDOMEN PELVIS W CONTRAST  Result Date: 04/24/2023 CLINICAL DATA:  Abdominal pain.  Metastatic pancreatic cancer. EXAM: CT ABDOMEN AND PELVIS WITH CONTRAST TECHNIQUE: Multidetector CT imaging of the abdomen and pelvis was performed using the standard protocol following bolus administration of intravenous contrast. RADIATION DOSE REDUCTION: This exam was performed according to the departmental dose-optimization program which includes automated exposure control, adjustment of the mA  and/or kV according to patient size and/or use of iterative reconstruction technique. CONTRAST:  OMNIPAQUE IOHEXOL 300 MG/ML  SOLN COMPARISON:  CT abdomen pelvis dated 04/03/2023. FINDINGS: Lower chest: Similar appearance of right lung base subpleural nodules measure up to 8 mm. Mitral and aortic valve repairs. No intra-abdominal free air or free fluid. Hepatobiliary: Multiple hypoenhancing hepatic metastatic disease. These lesions appear more conspicuous or have increased since the prior CT. No biliary dilatation. The gallbladder is unremarkable. Pancreas: Hypoenhancing mass involving the body and tail of the pancreas as seen previously in keeping with known malignancy. Spleen: Normal in size without focal abnormality. Adrenals/Urinary Tract: The adrenal glands are unremarkable. There is no hydronephrosis on either side. There is symmetric enhancement and excretion of contrast by both kidneys. The visualized ureters and the bladder appear unremarkable. Stomach/Bowel: Postsurgical changes of bowel with anastomotic suture in the pelvis. There is no bowel obstruction or active inflammation. The appendix is not visualized with certainty. No inflammatory changes identified in the right lower quadrant. Vascular/Lymphatic: Mild aortoiliac atherosclerotic disease. There is tumor encasement of the celiac trunk. There is infiltration of the fat plane around the proximal SMA suggestive of encasement. The celiac artery, SMA, IMA are patent. The renal arteries are patent. There is in case mint of the proximal half of the splenic artery with mass effect and high-grade luminal narrowing. The splenic vein is not visualized and appears thrombosed. The SMV and main portal vein are patent. A 2.7 x 2.0 cm hypoenhancing soft tissue abutting the main portal vein (25/2) may represent an enlarged and metastatic adenopathy versus infiltrative tumor. No portal venous gas. Reproductive: Hysterectomy.  No adnexal masses. Other: Midline  vertical anterior pelvic wall incisional scar. Musculoskeletal: Degenerative changes of the spine. No acute osseous pathology. IMPRESSION: 1. No acute intra-abdominal or pelvic pathology. 2. Hypoenhancing mass involving the body and tail of the pancreas in keeping with  known malignancy. 3. Multiple hypoenhancing hepatic metastatic disease. These lesions appear more conspicuous or have increased since the prior CT. 4. 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 6. Similar appearance of right lung base subpleural nodules measure up to 8 mm. 7.  Aortic Atherosclerosis (ICD10-I70.0). Electronically Signed   By: Elgie Collard M.D.   On: 04/24/2023 21:06    EKG EKG Interpretation  Date/Time:  Sunday May 15 2023 20:31:06 EDT Ventricular Rate:  128 PR Interval:  204 QRS Duration: 169 QT Interval:  335 QTC Calculation: 489 R Axis:   69 Text Interpretation: Sinus tachycardia Right bundle branch block Non-specific ST-t changes Confirmed by Cathren Laine ([id number]) on 05/15/2023 8:36:27 PM  Radiology CT CHEST ABDOMEN PELVIS W CONTRAST  Result Date: 05/15/2023 CLINICAL DATA:  Polytrauma, blunt EXAM: CT CHEST, ABDOMEN, AND PELVIS WITH CONTRAST TECHNIQUE: Multidetector CT imaging of the chest, abdomen and pelvis was performed following the standard protocol during bolus administration of intravenous contrast. RADIATION DOSE REDUCTION: This exam was performed according to the departmental dose-optimization program which includes automated exposure control, adjustment of the mA and/or kV according to patient size and/or use of iterative reconstruction technique. CONTRAST:  75mL OMNIPAQUE IOHEXOL 350 MG/ML SOLN COMPARISON:  Abdominopelvic CT 5 days ago 05/10/2023. Chest CT 04/05/2023 FINDINGS: CT CHEST FINDINGS Cardiovascular: No evidence of acute vascular or aortic injury. Aortic atherosclerosis. Right chest port tip in the SVC. The heart is normal in size.  Prosthetic mitral and tricuspid valves. No pericardial effusion. There is no central pulmonary embolus. Mediastinum/Nodes: No mediastinal hemorrhage. No mediastinal adenopathy. Patulous esophagus but no wall thickening. Lungs/Pleura: No pneumothorax. No evidence of pulmonary contusion. No pleural fluid. Again seen scattered pulmonary nodules. Reference right lower lobe nodule measures 9 x 5 mm, series 5, image 123 unchanged. Trachea and central airways are clear. Musculoskeletal: Prior median sternotomy. No acute fracture of the sternum, ribs, included clavicles or shoulder girdles. Thoracic degenerative change without acute fracture. There is no evidence of focal bone lesion. CT ABDOMEN PELVIS FINDINGS Hepatobiliary: No evidence of hepatic injury or perihepatic hematoma. Known hepatic metastatic disease is not significantly changed over the last 5 days. Multiple hypodense liver lesions. The gallbladder is nondistended. Periportal adenopathy is causing mass effect on the main portal vein. Pancreas: Hypodense pancreatic body and tail mass is unchanged from recent exam. There is no evidence of pancreatic injury or inflammation. Peripancreatic adenopathy. Spleen: No splenic injury or perisplenic hematoma. Adrenals/Urinary Tract: No adrenal nodule. No evidence of renal injury or focal renal abnormality. No evidence of bladder injury. There is diminished air within the bladder wall. Persistent but decreased perivesicular air anteriorly. Stomach/Bowel: No evidence of bowel or mesenteric injury. No bowel or gastric outlet obstruction. No bowel inflammation. Small to moderate volume of colonic stool. Vascular/Lymphatic: There is mass effect on the main portal vein from peripancreatic/portacaval adenopathy. The adenopathy was present on prior, however mass effect is new. Pancreatic/periportal node measures 18 mm, series 8, image 8. Additional prominent portal caval and periportal nodes. The splenic vein is again noted to be  occluded. Aortic atherosclerosis. No evidence of vascular injury. Reproductive: Hysterectomy without adnexal mass. Other: Persistent but diminished perivesicular air anterior to the bladder. No free fluid or hemorrhage. Musculoskeletal: No acute fracture of the pelvis or lumbar spine. No evidence of focal bone lesion. IMPRESSION: 1. No evidence of acute traumatic injury to the chest, abdomen, or pelvis. 2. Known pancreatic malignancy without significant change over the last  5 days. 3. Known hepatic metastatic disease is not significantly changed. Periportal adenopathy is causing mass effect on the main portal vein. 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 4. Unchanged pulmonary nodules. 5. Persistent but diminished perivesicular air. Aortic Atherosclerosis (ICD10-I70.0). Electronically Signed   By: Narda Rutherford M.D.   On: 05/15/2023 21:27   CT Head Wo Contrast  Result Date: 05/15/2023 CLINICAL DATA:  Head trauma, coagulopathy (Age 37-64y) EXAM: CT HEAD WITHOUT CONTRAST TECHNIQUE: Contiguous axial images were obtained from the base of the skull through the vertex without intravenous contrast. RADIATION DOSE REDUCTION: This exam was performed according to the departmental dose-optimization program which includes automated exposure control, adjustment of the mA and/or kV according to patient size and/or use of iterative reconstruction technique. COMPARISON:  Head CT 08/03/2022 FINDINGS: Brain: No intracranial hemorrhage, mass effect, or midline shift. No hydrocephalus. The basilar cisterns are patent. No evidence of territorial infarct or acute ischemia. No extra-axial or intracranial fluid collection. Vascular: No hyperdense vessel or unexpected calcification. Skull: No fracture or focal lesion. Sinuses/Orbits: Paranasal sinuses and mastoid air cells are clear. The visualized orbits are unremarkable. Other: Small right frontal scalp laceration. IMPRESSION: Small right frontal scalp laceration. No acute  intracranial abnormality. No skull fracture. Electronically Signed   By: Narda Rutherford M.D.   On: 05/15/2023 21:18   CT Cervical Spine Wo Contrast  Result Date: 05/15/2023 CLINICAL DATA:  Neck trauma, dangerous injury mechanism (Age 29-64y) EXAM: CT CERVICAL SPINE WITHOUT CONTRAST TECHNIQUE: Multidetector CT imaging of the cervical spine was performed without intravenous contrast. Multiplanar CT image reconstructions were also generated. RADIATION DOSE REDUCTION: This exam was performed according to the departmental dose-optimization program which includes automated exposure control, adjustment of the mA and/or kV according to patient size and/or use of iterative reconstruction technique. COMPARISON:  None Available. FINDINGS: Alignment: Normal. Skull base and vertebrae: No acute fracture. Vertebral body heights are maintained. The dens and skull base are intact. Soft tissues and spinal canal: No prevertebral fluid or swelling. No visible canal hematoma. Disc levels: Degenerative disc disease with disc space narrowing and spurring. No high-grade canal stenosis Upper chest: Assessed on concurrent chest CT, reported separately. Other: There is air in the venous vasculature. IMPRESSION: Degenerative disc disease without acute fracture or subluxation of the cervical spine. Electronically Signed   By: Narda Rutherford M.D.   On: 05/15/2023 21:16   DG Pelvis Portable  Result Date: 05/15/2023 CLINICAL DATA:  Status post fall. EXAM: PORTABLE PELVIS 1-2 VIEWS COMPARISON:  None Available. FINDINGS: There is no evidence of pelvic fracture or diastasis. No pelvic bone lesions are seen. Ill-defined surgical sutures are seen overlying the midline of the mid pelvis. IMPRESSION: Negative. Electronically Signed   By: Aram Candela M.D.   On: 05/15/2023 21:16   DG Chest Port 1 View  Result Date: 05/15/2023 CLINICAL DATA:  Insert epic diagnosis and indication. Fall. EXAM: PORTABLE CHEST 1 VIEW COMPARISON:  Most recent  radiographs chest CT 04/05/2023, radiograph 08/12/2022 FINDINGS: Right chest port in place. Prior median sternotomy. Previous cardiac valve repair. Stable heart size and mediastinal contours. No pulmonary edema, pleural fluid or pneumothorax. Pulmonary nodules on CT are not well seen by radiograph. On limited assessment, no acute osseous findings. IMPRESSION: 1. No acute abnormality. 2. Pulmonary nodules on CT are not well seen by radiograph. Electronically Signed   By: Narda Rutherford M.D.   On: 05/15/2023 20:48    Procedures .Marland KitchenLaceration Repair  Date/Time: 05/15/2023 10:25 PM  Performed  by: Cathren Laine, MD Authorized by: Cathren Laine, MD   Consent:    Consent given by:  Patient Laceration details:    Location:  Face   Face location:  Forehead   Length (cm):  2 Pre-procedure details:    Preparation:  Patient was prepped and draped in usual sterile fashion and imaging obtained to evaluate for foreign bodies Exploration:    Imaging outcome: foreign body not noted     Contaminated: no   Treatment:    Area cleansed with:  Saline   Amount of cleaning:  Standard   Irrigation solution:  Sterile saline Skin repair:    Repair method:  Sutures   Suture size:  6-0   Suture material:  Prolene   Suture technique:  Simple interrupted   Number of sutures:  4 Approximation:    Approximation:  Close Repair type:    Repair type:  Simple Post-procedure details:    Dressing:  Antibiotic ointment   Procedure completion:  Tolerated well, no immediate complications     Medications Ordered in ED Medications  lactated ringers bolus 1,000 mL (has no administration in time range)  lactated ringers bolus 1,000 mL (0 mLs Intravenous Stopped 05/15/23 2123)  lactated ringers bolus 1,000 mL (0 mLs Intravenous Paused 05/15/23 2219)  HYDROmorphone (DILAUDID) injection 0.5 mg (0.5 mg Intravenous Given 05/15/23 2122)  ondansetron (ZOFRAN) injection 4 mg (4 mg Intravenous Given 05/15/23 2122)  iohexol  (OMNIPAQUE) 350 MG/ML injection 75 mL (75 mLs Intravenous Contrast Given 05/15/23 2045)  lidocaine-EPINEPHrine (XYLOCAINE W/EPI) 2 %-1:200000 (PF) injection 20 mL (20 mLs Infiltration Given by Other 05/15/23 2229)  HYDROmorphone (DILAUDID) injection 0.5 mg (0.5 mg Intravenous Given 05/15/23 2212)    ED Course/ Medical Decision Making/ A&P                             Medical Decision Making Problems Addressed: AKI (acute kidney injury) [facility]): acute illness or injury with systemic symptoms that poses a threat to life or bodily functions Contusion of face, initial encounter: acute illness or injury with systemic symptoms that poses a threat to life or bodily functions Dehydration: acute illness or injury with systemic symptoms that poses a threat to life or bodily functions Failure to thrive in adult: chronic illness or injury with exacerbation, progression, or side effects of treatment that poses a threat to life or bodily functions Forehead laceration, initial encounter: acute illness or injury Hypotension due to hypovolemia: acute illness or injury with systemic symptoms that poses a threat to life or bodily functions Metastasis from pancreatic cancer [facility]): chronic illness or injury with exacerbation, progression, or side effects of treatment that poses a threat to life or bodily functions Protein-calorie malnutrition, severe (HCC): chronic illness or injury that poses a threat to life or bodily functions Syncope and collapse: acute illness or injury with systemic symptoms that poses a threat to life or bodily functions  Amount and/or Complexity of Data Reviewed Independent Historian: EMS    Details: hx External Data Reviewed: labs, radiology and notes. Labs: ordered. Decision-making details documented in ED Course. Radiology: ordered and independent interpretation performed. Decision-making details documented in ED Course. ECG/medicine tests: ordered and independent interpretation performed.  Decision-making details documented in ED Course. Discussion of management or test interpretation with external provider(s): Trauma and hospitalists physicians  Risk Prescription drug management. Parenteral controlled substances. Decision regarding hospitalization.   Iv ns. Continuous pulse ox and cardiac monitoring. Labs ordered/sent. Imaging  ordered.   Differential diagnosis includes head injury, sdh, dehydration, etc. Dispo decision including potential need for admission considered - will get labs and imaging and reassess.   Reviewed nursing notes and prior charts for additional history. External reports reviewed. Additional history from: EMS.  Pt level 1 trauma activation on arrival as bp low and fall on thinners. LR bolus. Trauma surgery consulted and evaluated in trauma bay.   Cardiac monitor: sinus rhythm, rate 120. Cultures sent. Additional ivf. Lac repair.   Labs reviewed/interpreted by me Bernerd Limbo felt c/w dehydration given recent v poor po intake. Pt denies fever, chills, sweats.  Lactate elevated - felt due to volume depletion and dehydration, not sepsis. Additional LR boluses. Bp improved.   As bp improved, pt requests pain med - states chronic pain/chr pain med use. Pt with hx met ca. Dilaudid iv.   Xrays reviewed/interpreted by me - no fx.   CT reviewed/interpreted by me - no hem. +metastatic ca.   Trauma md agrees w medicine admission.   Hospitalists consulted for admission.  Recheck chest cta. Abd soft nt. CTLS spine, non tender, aligned, no step off.  Pain improved. Bp improved from prior.   CRITICAL CARE metastatic cancer w anorexia, poor po intake, hypotension due to hypovolemia w aki, syncope.  Performed by: Suzi Roots Total critical care time: 45 minutes Critical care time was exclusive of separately billable procedures and treating other patients. Critical care was necessary to treat or prevent imminent or life-threatening deterioration. Critical care  was time spent personally by me on the following activities: development of treatment plan with patient and/or surrogate as well as nursing, discussions with consultants, evaluation of patient's response to treatment, examination of patient, obtaining history from patient or surrogate, ordering and performing treatments and interventions, ordering and review of laboratory studies, ordering and review of radiographic studies, pulse oximetry and re-evaluation of patient's condition.            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

## 2023-05-15 NOTE — ED Triage Notes (Signed)
Pt BIB [facility] from home c/o a fall on thinners. Pt had a syncopal episode while walking around outside. Pt had LOC for at least 1 minute. Pt has an obvious gash to the left eye brow and abrasions on the left cheek. Pt is a stage 4 cancer pt. Per EMS pt's BP was in the low 100s and 90s.

## 2023-05-16 ENCOUNTER — Other Ambulatory Visit: Payer: 59

## 2023-05-16 ENCOUNTER — Encounter (HOSPITAL_COMMUNITY): Payer: Self-pay | Admitting: Internal Medicine

## 2023-05-16 ENCOUNTER — Other Ambulatory Visit (HOSPITAL_COMMUNITY): Payer: Self-pay

## 2023-05-16 ENCOUNTER — Ambulatory Visit: Payer: 59 | Admitting: Hematology

## 2023-05-16 DIAGNOSIS — E785 Hyperlipidemia, unspecified: Secondary | ICD-10-CM

## 2023-05-16 DIAGNOSIS — E1169 Type 2 diabetes mellitus with other specified complication: Secondary | ICD-10-CM

## 2023-05-16 DIAGNOSIS — Z515 Encounter for palliative care: Secondary | ICD-10-CM | POA: Diagnosis not present

## 2023-05-16 DIAGNOSIS — G893 Neoplasm related pain (acute) (chronic): Secondary | ICD-10-CM | POA: Diagnosis not present

## 2023-05-16 DIAGNOSIS — I480 Paroxysmal atrial fibrillation: Secondary | ICD-10-CM

## 2023-05-16 DIAGNOSIS — Z7189 Other specified counseling: Secondary | ICD-10-CM

## 2023-05-16 DIAGNOSIS — C259 Malignant neoplasm of pancreas, unspecified: Secondary | ICD-10-CM | POA: Diagnosis not present

## 2023-05-16 DIAGNOSIS — N179 Acute kidney failure, unspecified: Secondary | ICD-10-CM | POA: Diagnosis not present

## 2023-05-16 LAB — BASIC METABOLIC PANEL
Anion gap: 13 (ref 5–15)
BUN: 15 mg/dL (ref 8–23)
CO2: 20 mmol/L — ABNORMAL LOW (ref 22–32)
Calcium: 7.9 mg/dL — ABNORMAL LOW (ref 8.9–10.3)
Chloride: 98 mmol/L (ref 98–111)
Creatinine, Ser: 1.4 mg/dL — ABNORMAL HIGH (ref 0.44–1.00)
GFR, Estimated: 42 mL/min — ABNORMAL LOW (ref >60)
Glucose, Bld: 134 mg/dL — ABNORMAL HIGH (ref 70–99)
Potassium: 3.6 mmol/L (ref 3.5–5.1)
Sodium: 131 mmol/L — ABNORMAL LOW (ref 135–145)

## 2023-05-16 LAB — URINALYSIS, COMPLETE (UACMP) WITH MICROSCOPIC
Bilirubin Urine: NEGATIVE
Glucose, UA: NEGATIVE mg/dL
Hgb urine dipstick: NEGATIVE
Ketones, ur: NEGATIVE mg/dL
Leukocytes,Ua: NEGATIVE
Nitrite: NEGATIVE
Protein, ur: NEGATIVE mg/dL
Specific Gravity, Urine: 1.021 (ref 1.005–1.030)
pH: 5 (ref 5.0–8.0)

## 2023-05-16 LAB — CULTURE, BLOOD (ROUTINE X 2): Culture: NO GROWTH

## 2023-05-16 LAB — PROTIME-INR
INR: 1.8 — ABNORMAL HIGH (ref 0.8–1.2)
Prothrombin Time: 21.4 seconds — ABNORMAL HIGH (ref 11.4–15.2)

## 2023-05-16 LAB — CBC
HCT: 28.2 % — ABNORMAL LOW (ref 36.0–46.0)
Hemoglobin: 9.3 g/dL — ABNORMAL LOW (ref 12.0–15.0)
MCH: 31.4 pg (ref 26.0–34.0)
MCHC: 33 g/dL (ref 30.0–36.0)
MCV: 95.3 fL (ref 80.0–100.0)
Platelets: 148 10*3/uL — ABNORMAL LOW (ref 150–400)
RBC: 2.96 MIL/uL — ABNORMAL LOW (ref 3.87–5.11)
RDW: 14.7 % (ref 11.5–15.5)
WBC: 6.4 10*3/uL (ref 4.0–10.5)
nRBC: 0.9 % — ABNORMAL HIGH (ref 0.0–0.2)

## 2023-05-16 LAB — GLUCOSE, CAPILLARY
Glucose-Capillary: 129 mg/dL — ABNORMAL HIGH (ref 70–99)
Glucose-Capillary: 122 mg/dL — ABNORMAL HIGH (ref 70–99)
Glucose-Capillary: 141 mg/dL — ABNORMAL HIGH (ref 70–99)

## 2023-05-16 LAB — LACTIC ACID, PLASMA: Lactic Acid, Venous: 1 mmol/L (ref 0.5–1.9)

## 2023-05-16 MED ORDER — OXYCODONE HCL 5 MG PO TABS: 20.0000 mg | ORAL_TABLET | ORAL | Status: DC | PRN

## 2023-05-16 MED ORDER — WARFARIN - PHARMACIST DOSING INPATIENT: Freq: Every day | Status: DC

## 2023-05-16 MED ORDER — WARFARIN SODIUM 7.5 MG PO TABS
7.5000 mg | ORAL_TABLET | Freq: Once | ORAL | Status: AC
  Administered 2023-05-16: 7.5 mg via ORAL
  Filled 2023-05-16: qty 1

## 2023-05-16 MED ORDER — GABAPENTIN 300 MG PO CAPS: 300.0000 mg | ORAL_CAPSULE | Freq: Three times a day (TID) | ORAL | Status: DC

## 2023-05-16 MED ORDER — METOPROLOL TARTRATE 12.5 MG HALF TABLET
12.5000 mg | ORAL_TABLET | Freq: Two times a day (BID) | ORAL | Status: DC
  Administered 2023-05-16: 12.5 mg via ORAL
  Filled 2023-05-16: qty 1

## 2023-05-16 MED ORDER — OXYCODONE HCL 5 MG PO TABS
10.0000 mg | ORAL_TABLET | ORAL | Status: DC | PRN
  Administered 2023-05-16: 10 mg via ORAL
  Filled 2023-05-16: qty 2

## 2023-05-16 MED ORDER — INSULIN ASPART 100 UNIT/ML IJ SOLN
0.0000 [IU] | INTRAMUSCULAR | Status: DC
  Administered 2023-05-16 (×2): 1 [IU] via SUBCUTANEOUS

## 2023-05-16 MED ORDER — METOPROLOL TARTRATE 5 MG/5ML IV SOLN
5.0000 mg | Freq: Once | INTRAVENOUS | Status: AC
  Administered 2023-05-16: 5 mg via INTRAVENOUS
  Filled 2023-05-16: qty 5

## 2023-05-16 MED ORDER — OXYCODONE HCL ER 10 MG PO T12A
40.0000 mg | EXTENDED_RELEASE_TABLET | Freq: Two times a day (BID) | ORAL | Status: DC
  Administered 2023-05-16 (×2): 40 mg via ORAL
  Filled 2023-05-16 (×2): qty 4

## 2023-05-16 MED ORDER — SODIUM CHLORIDE 0.9 % IV SOLN: INTRAVENOUS | Status: DC

## 2023-05-16 MED ORDER — INSULIN GLARGINE-YFGN 100 UNIT/ML ~~LOC~~ SOLN
10.0000 [IU] | Freq: Every day | SUBCUTANEOUS | Status: DC
  Administered 2023-05-16: 10 [IU] via SUBCUTANEOUS
  Filled 2023-05-16: qty 0.1

## 2023-05-16 NOTE — Consult Note (Signed)
Palliative Medicine Inpatient Consult Note  Consulting Provider:  Hillary Bow, DO   Reason for consult:   Palliative Care Consult Services Palliative Medicine Consult  Reason for Consult? Home hospice patient, in with weakness, fall, AKI probably due to poor PO intake from her stage 4 pancreatic cancer.  Discharged home with home hospice x2 days ago.  minimal PO intake since then.   05/16/2023  HPI:  Per intake H&P --> [name] is a 62 y.o. female with medical history significant of stage 4 pancreatic cancer, PAF on coumadin, HTN, DM2. Palliative care had seen [name] on prior admission in the setting of her pancreatic cancer. She has since discharge on 6/7 not been able to tolerate PO's. The PMT has been asked to get re-involved to support patient and to help determine plan in terms of where hospice should take place.   Clinical Assessment/Goals of Care:  *Please note that this is a verbal dictation therefore any spelling or grammatical errors are due to the "[employer]agon [employer] One" system interpretation.  I have reviewed medical records including EPIC notes, labs and imaging, received report from bedside RN, assessed the patient.    I met with [name] this morning to further discuss diagnosis prognosis, GOC, EOL wishes, disposition and options.   I introduced Palliative Medicine as specialized medical care for people living with serious illness. It focuses on providing relief from the symptoms and stress of a serious illness. The goal is to improve quality of life for both the patient and the family.  Medical History Review and Understanding:  I was able to review with [name] her history of stage IV pancreatic cancer, atrial fibrillation, type 2 diabetes, chronic pain, and hypertension.  Social History:  [name] shares that she is from [facility].  She is not presently married.  She has a son who is a paranoid schizophrenic who lives in a group home.  She  formally worked as a Lawyer.  She is a woman of extreme faith and practices within Christianity.  Functional and Nutritional State:  Preceding hospitalization(s) Briseida was living independently though having a harder time performing basic care activities due to fatigue and chronic pain.  Her appetite has been dwindling for some time now and she expresses not having a want need or desire to eat  Palliative Symptoms:  Lower back pain requiring oxycodone as well as OxyContin.  Denies shortness of breath or nausea.  Advance Directives:  A detailed discussion was had today regarding advanced directives.  Patients surrogate decision maker is her sister, [name].  Code Status:  Concepts specific to code status, artifical feeding and hydration, continued IV antibiotics and rehospitalization was had.  The difference between a aggressive medical intervention path  and a palliative comfort care path for this patient at this time was had.   [name] is an established DNAR/DNI.  Discussion:  [name] shared that she recently has been enrolled in hospice through [facility] and was okay though became more weakened at home and continued to pass out. She is aware that this may be in the setting of her lack of oral intake.   We reviewed that the hospice plans to take her to their inpatient setting to further support her symptoms needs. After which time she would like to go home. She is trying to coordinate a home CNA to assist with bADLs.   Discussed the importance of continued conversation with family and their  medical providers regarding overall plan of care and treatment  options, ensuring decisions are within the context of the patients values and GOCs.  Decision Maker: Patient is able to make decisions for herself though if unable to do so would rely on her sister, [name].   SUMMARY OF RECOMMENDATIONS   DNAR/DNI  Appreciate Hospice Liaison - [name] involvement  Plan to  transition to [facility] for pain management  PMT will peripherally follow from her on given that this is an established hospice patient  Code Status/Advance Care Planning: DNAR/DNI   Symptom Management:  Acute on chronic pain due to malignancy: - Oxycontin 40mg  PO BID - of note previously on 80mg  TID on recent discharge plan to titrate as tolerated - Start oxycodone 10-20mg  PO Q4H PRN - Start Gabapentin 300mg  PO TID - s/p celiac plexus block on 5/29  - IV Dilaudid to 0.5-1 mg every two hours PRN  Palliative Prophylaxis:  Aspiration, Bowel Regimen, Delirium Protocol, Frequent Pain Assessment, Oral Care, Palliative Wound Care, and Turn Reposition  Additional Recommendations (Limitations, Scope, Preferences): Continue present care  Psycho-social/Spiritual:  Desire for further Chaplaincy support: Yes Additional Recommendations: Education on progression of pancreatic cancer   Prognosis: < 6 months  Discharge Planning: Discharge to [facility] for ongoing symptom management  Vitals:   05/16/23 0408 05/16/23 0410  BP: 109/85 109/85  Pulse: 81 (!) 135  Resp: 16 16  Temp:    SpO2: (!) 88% 100%    Intake/Output Summary (Last 24 hours) at 05/16/2023 0645 Last data filed at 05/15/2023 2123 Gross per 24 hour  Intake 1000 ml  Output --  Net 1000 ml   Last Weight  Most recent update: 05/16/2023  1:45 AM    Weight  77 kg (169 lb 12.1 oz)            Gen:  Elderly AA F in NAD HEENT: moist mucous membranes CV: Irregular rate and rhythm  PULM: On RA, breathing is even and nonlabored   ABD: soft/nontender  EXT: No edema  Neuro: Alert and oriented x3   PPS: 30%   This conversation/these recommendations were discussed with patient primary care team, Dr. Sherryll Burger  Billing based on MDM: High  Problems Addressed: One acute or chronic illness or injury that poses a threat to life or bodily function  Amount and/or Complexity of Data: Category 3:Discussion of management or test  interpretation with external physician/other qualified health care professional/appropriate source (not separately reported)  Risks: Decision not to resuscitate or to de-escalate care because of poor prognosis ______________________________________________________ [name] [facility] Palliative Medicine Team Team Cell Phone: [phone] Please utilize secure chat with additional questions, if there is no response within 30 minutes please call the above phone number  Palliative Medicine Team providers are available by phone from 7am to 7pm daily and can be reached through the team cell phone.  Should this patient require assistance outside of these hours, please call the patient's attending physician.

## 2023-05-16 NOTE — H&P (Addendum)
History and Physical    Patient: [name] ZOX:[id number] DOB: [birth year] DOA: 05/15/2023 DOS: the patient was seen and examined on 05/16/2023 PCP: Chrys Racer, MD  Patient coming from: Home  Chief Complaint:  Chief Complaint  Patient presents with   Fall   HPI: [name] is a 62 y.o. female with medical history significant of stage 4 pancreatic cancer, PAF on coumadin, HTN, DM2.  Pt recently admitted 6/4-6/7 with UTI, treated with rocephin for a sensitive e.coli, discharged home with home hospice.  Per that DC summary: "She was evaluated by physical therapy as well during hospitalization and not felt to warrant needing SNF. Patient was unable to afford assisted or independent living and therefore she was arranged for home health aide and RN at discharge as well as establishing care with hospice. Given her poor prognosis and expected physical decline, she may warrant further hospitalization and/or placement with residential hospice once she is no longer able to live alone as she has no reliable care from family/friends."  Since discharge she has basically had no PO intake other than the occasional ensure.  Today generalized weakness, lightheaded on standing.  Pt had stood, was walking, felt faint, and had syncopal episode. Contusion to face/forehead, lac to area.  BP in 80s-90s in ED.  New AKI with creat 1.9.   Review of Systems: As mentioned in the history of present illness. All other systems reviewed and are negative. Past Medical History:  Diagnosis Date   Acute pericardial effusion 05/17/2022   AKI (acute kidney injury) (HCC) 05/17/2022   Anxiety    Arthritis    Cervicalgia    CHF (congestive heart failure) (HCC)    Chondromalacia of patella    Chronic neck pain    Chronic pain syndrome    Depression    secondary to loss of her son at age 43   Diabetic neuropathy (HCC)    DMII (diabetes mellitus, type 2) (HCC)    Dysrhythmia    Enthesopathy of hip  region    Fibromyalgia    GERD (gastroesophageal reflux disease)    Hep C w/o coma, chronic (HCC)    Hepatitis C    diagnosed 2005   Hypertension    Insomnia    Low back pain    Lumbago    Mitral regurgitation    Obesity    Pain in joint, upper arm    Primary localized osteoarthrosis, lower leg    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   Substance abuse (HCC)    sober since 2001   Tobacco abuse    Tricuspid regurgitation    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   Past Surgical History:  Procedure Laterality Date   ABDOMINAL HYSTERECTOMY     at age 57, unknown reasons   BIOPSY  04/07/2023   Procedure: BIOPSY;  Surgeon: Lemar Lofty., MD;  Location: [facility];  Service: Gastroenterology;;   Fidela Salisbury RELEASE  12/07/2011   left side   COLONOSCOPY WITH PROPOFOL N/A 07/30/2022   Procedure: COLONOSCOPY WITH PROPOFOL;  Surgeon: Meryl Dare, MD;  Location: [facility] ENDOSCOPY;  Service: Gastroenterology;  Laterality: N/A;   ESOPHAGOGASTRODUODENOSCOPY (EGD) WITH PROPOFOL N/A 04/07/2023   Procedure: ESOPHAGOGASTRODUODENOSCOPY (EGD) WITH PROPOFOL;  Surgeon: Meridee Score Netty Starring., MD;  Location: [facility] ENDOSCOPY;  Service: Gastroenterology;  Laterality: N/A;   EUS N/A 04/07/2023   Procedure: ESOPHAGEAL ENDOSCOPIC ULTRASOUND (EUS) RADIAL;  Surgeon: Meridee Score Netty Starring., MD;  Location: [facility] ENDOSCOPY;  Service: Gastroenterology;  Laterality: N/A;  EYE SURGERY     FINE NEEDLE ASPIRATION N/A 04/07/2023   Procedure: FINE NEEDLE ASPIRATION (FNA) LINEAR;  Surgeon: Lemar Lofty., MD;  Location: [facility] ENDOSCOPY;  Service: Gastroenterology;  Laterality: N/A;   FOOT SURGERY Bilateral    HOT HEMOSTASIS N/A 07/30/2022   Procedure: HOT HEMOSTASIS (ARGON PLASMA COAGULATION/BICAP);  Surgeon: Meryl Dare, MD;  Location: [facility] ENDOSCOPY;  Service: Gastroenterology;  Laterality: N/A;   IR IMAGING GUIDED PORT INSERTION  05/03/2023   MITRAL VALVE REPAIR N/A 04/19/2022    Procedure: MITRAL VALVE REPAIR WITH PHYSIO II ANNULOPLASTY RING;  Surgeon: Loreli Slot, MD;  Location: [facility] OR;  Service: Open Heart Surgery;  Laterality: N/A;   PERICARDIOCENTESIS N/A 05/10/2022   Procedure: PERICARDIOCENTESIS;  Surgeon: Corky Crafts, MD;  Location: [facility] INVASIVE CV LAB;  Service: Cardiovascular;  Laterality: N/A;   POLYPECTOMY  07/30/2022   Procedure: POLYPECTOMY;  Surgeon: Meryl Dare, MD;  Location: [facility] ENDOSCOPY;  Service: Gastroenterology;;   RIGHT/LEFT HEART CATH AND CORONARY ANGIOGRAPHY N/A 02/12/2022   Procedure: RIGHT/LEFT HEART CATH AND CORONARY ANGIOGRAPHY;  Surgeon: Lyn Records, MD;  Location: [facility] INVASIVE CV LAB;  Service: Cardiovascular;  Laterality: N/A;   TEE WITHOUT CARDIOVERSION N/A 02/03/2022   Procedure: TRANSESOPHAGEAL ECHOCARDIOGRAM (TEE);  Surgeon: Wendall Stade, MD;  Location: [facility] ENDOSCOPY;  Service: Cardiovascular;  Laterality: N/A;   TEE WITHOUT CARDIOVERSION N/A 04/19/2022   Procedure: TRANSESOPHAGEAL ECHOCARDIOGRAM (TEE);  Surgeon: Loreli Slot, MD;  Location: [facility] OR;  Service: Open Heart Surgery;  Laterality: N/A;   TEE WITHOUT CARDIOVERSION N/A 07/28/2022   Procedure: TRANSESOPHAGEAL ECHOCARDIOGRAM (TEE);  Surgeon: Thurmon Fair, MD;  Location: [facility] ENDOSCOPY;  Service: Cardiovascular;  Laterality: N/A;   TRICUSPID VALVE REPLACEMENT N/A 04/19/2022   Procedure: TRICUSPID VALVE REPAIR WITH MC3 ANNULOPLASTY RING;  Surgeon: Loreli Slot, MD;  Location: [facility] OR;  Service: Open Heart Surgery;  Laterality: N/A;   Social History:  reports that she has been smoking cigarettes. She has a 8.75 pack-year smoking history. She has never used smokeless tobacco. She reports that she does not drink alcohol and does not use drugs.  Allergies  Allergen Reactions   Norco [Hydrocodone-Acetaminophen] Itching and Other (See Comments)    Tolerates Oxycodone    Family History  Problem Relation Age of Onset   Uterine cancer  Mother    Alcohol abuse Father    Alcohol abuse Sister    Drug abuse Sister    Drug abuse Brother    Alcohol abuse Brother    Schizophrenia Son    Diabetes Other    Arthritis Other    Hypertension Other    Heart attack Son 88       Died suddenly    Prior to Admission medications   Medication Sig Start Date End Date Taking? Authorizing Provider  alum & mag hydroxide-simeth (MAALOX/MYLANTA) 200-200-20 MG/5ML suspension Take 30 mLs by mouth every 4 (four) hours as needed for indigestion. 05/06/23   Glade Lloyd, MD  ARIPiprazole (ABILIFY) 5 MG tablet Take 2.5 mg by mouth daily. 07/20/22   [provider]  Blood Glucose Monitoring Suppl DEVI 1 each by Does not apply route in the morning, at noon, and at bedtime. May substitute to any manufacturer covered by patient's insurance. 04/09/23   Glade Lloyd, MD  buPROPion (WELLBUTRIN XL) 300 MG 24 hr tablet Take 300 mg by mouth in the morning. 03/24/22   [provider]  dexlansoprazole (DEXILANT) 60 MG capsule Take 60 mg by  mouth daily. 07/26/22   [provider]  diclofenac Sodium (VOLTAREN) 1 % GEL Apply 2 g topically 4 (four) times daily as needed (pain). 06/29/17   [provider]  gabapentin (NEURONTIN) 300 MG capsule Take 300 mg by mouth 3 (three) times daily. 08/19/22   [provider]  Glucose Blood (BLOOD GLUCOSE TEST STRIPS) STRP 1 each by In Vitro route in the morning, at noon, and at bedtime. May substitute to any manufacturer covered by patient's insurance. 04/09/23   Glade Lloyd, MD  hydrALAZINE (APRESOLINE) 25 MG tablet Take 1 tablet (25 mg total) by mouth daily as needed (BP>150). 04/26/23   Chandrasekhar, Mahesh A, MD  insulin glargine (LANTUS) 100 UNIT/ML Solostar Pen Inject 15 Units into the skin daily. 04/09/23   Glade Lloyd, MD  Insulin Pen Needle 31G X 5 MM MISC Lantus 15 units daily 04/09/23   Glade Lloyd, MD  linagliptin (TRADJENTA) 5 MG TABS tablet Take 5 mg by mouth daily.     [provider]  metoprolol tartrate (LOPRESSOR) 25 MG tablet Take 0.5 tablets (12.5 mg total) by mouth 2 (two) times daily. 05/13/23   Lewie Chamber, MD  naloxone [facility]) nasal spray 4 mg/0.1 mL PRN for suspected opiate OD 04/24/23   Glyn Ade, MD  oxyCODONE ER [facility]) 36 MG C12A Take 1 capsule (36 mg total) by mouth every 8 (eight) hours. 05/14/23     Oxycodone HCl 10 MG TABS Take 1-2 tablets (10-20 mg total) by mouth every 4 (four) hours as needed. 05/13/23 05/12/24  Lewie Chamber, MD  Oxycodone HCl 10 MG TABS Take 1-2 tablets (10-20 mg total) by mouth every 3 (three) hours as directed for pain. 05/14/23     Oyster Shell (OYSTER CALCIUM) 500 MG TABS tablet Take 500 mg of elemental calcium by mouth daily.    [provider]  polyethylene glycol powder (GLYCOLAX/MIRALAX) 17 GM/SCOOP powder Take 17 g by mouth daily. 05/07/23   Glade Lloyd, MD  PREMARIN 0.45 MG tablet Take 0.45 mg by mouth daily. 01/11/22   [provider]  QUEtiapine (SEROQUEL) 25 MG tablet Take 25 mg by mouth as needed. 03/17/23   [provider]  senna (SENOKOT) 8.6 MG TABS tablet Take 2 tablets (17.2 mg total) by mouth daily. 05/07/23   Glade Lloyd, MD  tobramycin-dexamethasone [facility]) ophthalmic solution 1 drop 4 (four) times daily. 04/14/23   [provider]  traZODone (DESYREL) 100 MG tablet Take 200 mg by mouth at bedtime. Pt takes 2 tablets 200 mg at bedtime.    [provider]  TRINTELLIX 10 MG TABS tablet Take 10 mg by mouth daily. 10/22/22   [provider]  warfarin (COUMADIN) 5 MG tablet Take 5 mg by mouth as directed. Take 5mg  po daily, except 2.5mg  on Sunday.    [provider]    Physical Exam: Vitals:   05/15/23 2245 05/16/23 0000 05/16/23 0026 05/16/23 0030  BP: (!) 87/73 (!) 106/91 103/78 111/88  Pulse: (!) 119     Resp: 20 15 20 16   Temp:   (!) 97.5 F (36.4 C)   TempSrc:   Oral   SpO2: 97%  96%    Constitutional: NAD, calm,  comfortable Respiratory: clear to auscultation bilaterally, no wheezing, no crackles. Normal respiratory effort. No accessory muscle use.  Cardiovascular: Tachycardic Abdomen: no tenderness, no masses palpated. No hepatosplenomegaly. Bowel sounds positive.  Skin: Contusion and 1 cm lac to right forehead.  Swelling of R face Neurologic: CN 2-12  grossly intact. Sensation intact, DTR normal. Strength 5/5 in all 4.  Psychiatric: Normal judgment and insight. Alert and oriented x 3. Normal mood.   Data Reviewed:    Labs on Admission: I have personally reviewed following labs and imaging studies  CBC: Recent Labs  Lab 05/10/23 1754 05/10/23 1818 05/11/23 0445 05/12/23 0406 05/13/23 0334 05/15/23 2026 05/15/23 2037  WBC 7.2  --  6.6 5.7 5.5 7.7  --   NEUTROABS 4.7  --   --   --   --   --   --   HGB 13.0   < > 11.3* 10.2* 10.2* 10.0* 11.6*  HCT 38.3   < > 33.9* 30.5* 30.5* 30.2* 34.0*  MCV 91.6  --  92.1 92.7 93.6 93.8  --   PLT 169  --  141* 128* 131* 165  --    < > = values in this interval not displayed.   Basic Metabolic Panel: Recent Labs  Lab 05/10/23 1754 05/10/23 1818 05/11/23 0445 05/15/23 2026 05/15/23 2037  NA 131* 133* 131* 129* 131*  K 4.7 4.4 3.8 3.8 3.9  CL 98 97* 101 97* 99  CO2 22  --  21* 18*  --   GLUCOSE 288* 277* 201* 182* 180*  BUN 25* 27* 17 17 18   CREATININE 0.89 0.70 0.64 1.90* 1.90*  CALCIUM 8.5*  --  7.9* 8.4*  --   MG  --   --  1.6*  --   --   PHOS  --   --  2.7  --   --    GFR: Estimated Creatinine Clearance: 33.9 mL/min (A) (by C-G formula based on SCr of 1.9 mg/dL (H)). Liver Function Tests: Recent Labs  Lab 05/10/23 1754 05/11/23 0445 05/15/23 2026  AST 28 25 25   ALT 32 29 32  ALKPHOS 126 108 127*  BILITOT 0.9 0.8 0.6  PROT 6.8 6.0* 6.0*  ALBUMIN 3.8 3.3* 3.4*   Recent Labs  Lab 05/10/23 1754 05/15/23 2026  LIPASE 52* 28   No results for input(s): "AMMONIA" in the last 168 hours. Coagulation Profile: Recent Labs  Lab  05/10/23 1811 05/12/23 0406 05/13/23 0334 05/15/23 2026  INR 1.0 1.2 1.4* 1.8*   Cardiac Enzymes: No results for input(s): "CKTOTAL", "CKMB", "CKMBINDEX", "TROPONINI" in the last 168 hours. BNP (last 3 results) Recent Labs    08/24/22 1555 12/01/22 1629  PROBNP 2,927* 2,487*   HbA1C: No results for input(s): "HGBA1C" in the last 72 hours. CBG: Recent Labs  Lab 05/12/23 1147 05/12/23 1715 05/12/23 2304 05/13/23 0718 05/13/23 1147  GLUCAP 156* 195* 190* 238* 254*   Lipid Profile: No results for input(s): "CHOL", "HDL", "LDLCALC", "TRIG", "CHOLHDL", "LDLDIRECT" in the last 72 hours. Thyroid Function Tests: No results for input(s): "TSH", "T4TOTAL", "FREET4", "T3FREE", "THYROIDAB" in the last 72 hours. Anemia Panel: No results for input(s): "VITAMINB12", "FOLATE", "FERRITIN", "TIBC", "IRON", "RETICCTPCT" in the last 72 hours. Urine analysis:    Component Value Date/Time   COLORURINE YELLOW 05/16/2023 0026   APPEARANCEUR HAZY (A) 05/16/2023 0026   LABSPEC 1.021 05/16/2023 0026   PHURINE 5.0 05/16/2023 0026   GLUCOSEU NEGATIVE 05/16/2023 0026   GLUCOSEU NEG mg/dL 40/98/1191 4782   HGBUR NEGATIVE 05/16/2023 0026   HGBUR negative 02/27/2007 1327   BILIRUBINUR NEGATIVE 05/16/2023 0026   KETONESUR NEGATIVE 05/16/2023 0026   PROTEINUR NEGATIVE 05/16/2023 0026   UROBILINOGEN 1.0 06/10/2010 1729   NITRITE NEGATIVE 05/16/2023 0026   LEUKOCYTESUR NEGATIVE 05/16/2023 0026    Radiological  Exams on Admission: CT CHEST ABDOMEN PELVIS W CONTRAST  Result Date: 05/15/2023 CLINICAL DATA:  Polytrauma, blunt EXAM: CT CHEST, ABDOMEN, AND PELVIS WITH CONTRAST TECHNIQUE: Multidetector CT imaging of the chest, abdomen and pelvis was performed following the standard protocol during bolus administration of intravenous contrast. RADIATION DOSE REDUCTION: This exam was performed according to the departmental dose-optimization program which includes automated exposure control, adjustment of the  mA and/or kV according to patient size and/or use of iterative reconstruction technique. CONTRAST:  75mL OMNIPAQUE IOHEXOL 350 MG/ML SOLN COMPARISON:  Abdominopelvic CT 5 days ago 05/10/2023. Chest CT 04/05/2023 FINDINGS: CT CHEST FINDINGS Cardiovascular: No evidence of acute vascular or aortic injury. Aortic atherosclerosis. Right chest port tip in the SVC. The heart is normal in size. Prosthetic mitral and tricuspid valves. No pericardial effusion. There is no central pulmonary embolus. Mediastinum/Nodes: No mediastinal hemorrhage. No mediastinal adenopathy. Patulous esophagus but no wall thickening. Lungs/Pleura: No pneumothorax. No evidence of pulmonary contusion. No pleural fluid. Again seen scattered pulmonary nodules. Reference right lower lobe nodule measures 9 x 5 mm, series 5, image 123 unchanged. Trachea and central airways are clear. Musculoskeletal: Prior median sternotomy. No acute fracture of the sternum, ribs, included clavicles or shoulder girdles. Thoracic degenerative change without acute fracture. There is no evidence of focal bone lesion. CT ABDOMEN PELVIS FINDINGS Hepatobiliary: No evidence of hepatic injury or perihepatic hematoma. Known hepatic metastatic disease is not significantly changed over the last 5 days. Multiple hypodense liver lesions. The gallbladder is nondistended. Periportal adenopathy is causing mass effect on the main portal vein. Pancreas: Hypodense pancreatic body and tail mass is unchanged from recent exam. There is no evidence of pancreatic injury or inflammation. Peripancreatic adenopathy. Spleen: No splenic injury or perisplenic hematoma. Adrenals/Urinary Tract: No adrenal nodule. No evidence of renal injury or focal renal abnormality. No evidence of bladder injury. There is diminished air within the bladder wall. Persistent but decreased perivesicular air anteriorly. Stomach/Bowel: No evidence of bowel or mesenteric injury. No bowel or gastric outlet obstruction. No  bowel inflammation. Small to moderate volume of colonic stool. Vascular/Lymphatic: There is mass effect on the main portal vein from peripancreatic/portacaval adenopathy. The adenopathy was present on prior, however mass effect is new. Pancreatic/periportal node measures 18 mm, series 8, image 8. Additional prominent portal caval and periportal nodes. 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 Aortic atherosclerosis. No evidence of vascular injury. Reproductive: Hysterectomy without adnexal mass. Other: Persistent but diminished perivesicular air anterior to the bladder. No free fluid or hemorrhage. Musculoskeletal: No acute fracture of the pelvis or lumbar spine. No evidence of focal bone lesion. IMPRESSION: 1. No evidence of acute traumatic injury to the chest, abdomen, or pelvis. 2. Known pancreatic malignancy without significant change over the last 5 days. 3. Known hepatic metastatic disease is not significantly changed. Periportal adenopathy is causing mass effect on the main portal vein. 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 4. Unchanged pulmonary nodules. 5. Persistent but diminished perivesicular air. Aortic Atherosclerosis (ICD10-I70.0). Electronically Signed   By: Narda Rutherford M.D.   On: 05/15/2023 21:27   CT Head Wo Contrast  Result Date: 05/15/2023 CLINICAL DATA:  Head trauma, coagulopathy (Age 71-64y) EXAM: CT HEAD WITHOUT CONTRAST TECHNIQUE: Contiguous axial images were obtained from the base of the skull through the vertex without intravenous contrast. RADIATION DOSE REDUCTION: This exam was performed according to the departmental dose-optimization program which includes automated exposure control, adjustment of the mA and/or kV according to patient size  and/or use of iterative reconstruction technique. COMPARISON:  Head CT 08/03/2022 FINDINGS: Brain: No intracranial hemorrhage, mass effect, or midline shift. No hydrocephalus. The basilar cisterns are patent. No evidence of  territorial infarct or acute ischemia. No extra-axial or intracranial fluid collection. Vascular: No hyperdense vessel or unexpected calcification. Skull: No fracture or focal lesion. Sinuses/Orbits: Paranasal sinuses and mastoid air cells are clear. The visualized orbits are unremarkable. Other: Small right frontal scalp laceration. IMPRESSION: Small right frontal scalp laceration. No acute intracranial abnormality. No skull fracture. Electronically Signed   By: Narda Rutherford M.D.   On: 05/15/2023 21:18   CT Cervical Spine Wo Contrast  Result Date: 05/15/2023 CLINICAL DATA:  Neck trauma, dangerous injury mechanism (Age 13-64y) EXAM: CT CERVICAL SPINE WITHOUT CONTRAST TECHNIQUE: Multidetector CT imaging of the cervical spine was performed without intravenous contrast. Multiplanar CT image reconstructions were also generated. RADIATION DOSE REDUCTION: This exam was performed according to the departmental dose-optimization program which includes automated exposure control, adjustment of the mA and/or kV according to patient size and/or use of iterative reconstruction technique. COMPARISON:  None Available. FINDINGS: Alignment: Normal. Skull base and vertebrae: No acute fracture. Vertebral body heights are maintained. The dens and skull base are intact. Soft tissues and spinal canal: No prevertebral fluid or swelling. No visible canal hematoma. Disc levels: Degenerative disc disease with disc space narrowing and spurring. No high-grade canal stenosis Upper chest: Assessed on concurrent chest CT, reported separately. Other: There is air in the venous vasculature. IMPRESSION: Degenerative disc disease without acute fracture or subluxation of the cervical spine. Electronically Signed   By: Narda Rutherford M.D.   On: 05/15/2023 21:16   DG Pelvis Portable  Result Date: 05/15/2023 CLINICAL DATA:  Status post fall. EXAM: PORTABLE PELVIS 1-2 VIEWS COMPARISON:  None Available. FINDINGS: There is no evidence of pelvic  fracture or diastasis. No pelvic bone lesions are seen. Ill-defined surgical sutures are seen overlying the midline of the mid pelvis. IMPRESSION: Negative. Electronically Signed   By: Aram Candela M.D.   On: 05/15/2023 21:16   DG Chest Port 1 View  Result Date: 05/15/2023 CLINICAL DATA:  Insert epic diagnosis and indication. Fall. EXAM: PORTABLE CHEST 1 VIEW COMPARISON:  Most recent radiographs chest CT 04/05/2023, radiograph 08/12/2022 FINDINGS: Right chest port in place. Prior median sternotomy. Previous cardiac valve repair. Stable heart size and mediastinal contours. No pulmonary edema, pleural fluid or pneumothorax. Pulmonary nodules on CT are not well seen by radiograph. On limited assessment, no acute osseous findings. IMPRESSION: 1. No acute abnormality. 2. Pulmonary nodules on CT are not well seen by radiograph. Electronically Signed   By: Narda Rutherford M.D.   On: 05/15/2023 20:48    EKG: Independently reviewed.   Assessment and Plan: * AKI (acute kidney injury) (HCC) Very likely pre-renal / ATN in setting of hypotension + very poor PO intake over past 2 days since discharge.  Suspect poor PO intake secondary to her advanced stage 4 pancreatic cancer. IVF Hold home BP meds Strict intake and output Repeat BMP in AM No evidence of obstruction on CT today. No evidence of recurrent UTI on UA today and symptoms had improved per patient.  Pancreatic cancer (HCC) Pt on hospice at home currently. Sounds like she's not having adequate PO fluid intake secondary to cancer pain and feeling full since discharge 2 days ago. Cont Oxycontin long acting Hold PO oxy short acting Using IV dilaudid for pain for the moment since this seems to work better for her (  or worked well in ED anyhow). Will put in for pal care to see her in AM again. Question need for placement given poor results going home. PT/OT if not ready for placement / IP hospice candidate.  Paroxysmal atrial fibrillation  (HCC) Cont coumadin Hold metoprolol due to soft BPs. Currently S.Tach to 125, but doesn't look like s.tach is new and pt also confirms that this is ongoing / chronic. Was S.Tach to 129 at time of discharge 2 days ago.  Type 2 diabetes mellitus with hyperlipidemia (HCC) Lantus 10u daily SSI sensitive Q4H Will let pt eat, but sounds like she's not eating much at this stage (ensures and that's about it).      Advance Care Planning:   Code Status: DNR  Consults: Consult put in to pal care in Epic  Family Communication: No family in room  Severity of Illness: The appropriate patient status for this patient is OBSERVATION. Observation status is judged to be reasonable and necessary in order to provide the required intensity of service to ensure the patient's safety. The patient's presenting symptoms, physical exam findings, and initial radiographic and laboratory data in the context of their medical condition is felt to place them at decreased risk for further clinical deterioration. Furthermore, it is anticipated that the patient will be medically stable for discharge from the hospital within 2 midnights of admission.   Author: Hillary Bow., DO 05/16/2023 1:43 AM  For on call review [email or web address].

## 2023-05-16 NOTE — Plan of Care (Signed)

## 2023-05-16 NOTE — ED Notes (Signed)
ED TO INPATIENT HANDOFF REPORT  ED Nurse Name and Phone #: Vena Austria, RN  S Name/Age/Gender [name] 62 y.o. female Room/Bed: 010C/010C  Code Status   Code Status: DNR  Home/SNF/Other Home Patient oriented to: self Is this baseline? Yes   Triage Complete: Triage complete  Chief Complaint AKI (acute kidney injury) (HCC) [N17.9]  Triage Note Pt BIB [facility] from home c/o a fall on thinners. Pt had a syncopal episode while walking around outside. Pt had LOC for at least 1 minute. Pt has an obvious gash to the left eye brow and abrasions on the left cheek. Pt is a stage 4 cancer pt. Per EMS pt's BP was in the low 100s and 90s.    Allergies Allergies  Allergen Reactions   Norco [Hydrocodone-Acetaminophen] Itching and Other (See Comments)    Tolerates Oxycodone    Level of Care/Admitting Diagnosis ED Disposition     ED Disposition  Admit   Condition  --   Comment  Hospital Area: [facility] [[id number]]  Level of Care: Telemetry Medical [[id number]]  May place patient in observation at [facility] or [facility] if equivalent level of care is available:: No  Covid Evaluation: Asymptomatic - no recent exposure (last 10 days) testing not required  Diagnosis: AKI (acute kidney injury) [facility]) [[id number]]  Admitting Physician: Hillary Bow [phone]  Attending Physician: Hillary Bow [[id number]]          B Medical/Surgery History Past Medical History:  Diagnosis Date   Acute pericardial effusion 05/17/2022   AKI (acute kidney injury) (HCC) 05/17/2022   Anxiety    Arthritis    Cervicalgia    CHF (congestive heart failure) (HCC)    Chondromalacia of patella    Chronic neck pain    Chronic pain syndrome    Depression    secondary to loss of her son at age 40   Diabetic neuropathy (HCC)    DMII (diabetes mellitus, type 2) (HCC)    Dysrhythmia    Enthesopathy of hip region    Fibromyalgia    GERD (gastroesophageal reflux disease)    Hep C w/o  coma, chronic (HCC)    Hepatitis C    diagnosed 2005   Hypertension    Insomnia    Low back pain    Lumbago    Mitral regurgitation    Obesity    Pain in joint, upper arm    Primary localized osteoarthrosis, lower leg    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   Substance abuse (HCC)    sober since 2001   Tobacco abuse    Tricuspid regurgitation    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   Past Surgical History:  Procedure Laterality Date   ABDOMINAL HYSTERECTOMY     at age 73, unknown reasons   BIOPSY  04/07/2023   Procedure: BIOPSY;  Surgeon: Lemar Lofty., MD;  Location: [facility] END[facility]COPY;  Service: Gastroenterology;;   Fidela Salisbury RELEASE  12/07/2011   left side   COLONOSCOPY WITH PROPOFOL N/A 07/30/2022   Procedure: COLONOSCOPY WITH PROPOFOL;  Surgeon: Meryl Dare, MD;  Location: [facility] ENDOSCOPY;  Service: Gastroenterology;  Laterality: N/A;   ESOPHAGOGASTRODUODENOSCOPY (EGD) WITH PROPOFOL N/A 04/07/2023   Procedure: ESOPHAGOGASTRODUODENOSCOPY (EGD) WITH PROPOFOL;  Surgeon: Meridee Score Netty Starring., MD;  Location: [facility] ENDOSCOPY;  Service: Gastroenterology;  Laterality: N/A;   EUS N/A 04/07/2023   Procedure: ESOPHAGEAL ENDOSCOPIC ULTRASOUND (EUS) RADIAL;  Surgeon: Lemar Lofty., MD;  Location: [facility]  ENDOSCOPY;  Service: Gastroenterology;  Laterality: N/A;   EYE SURGERY     FINE NEEDLE ASPIRATION N/A 04/07/2023   Procedure: FINE NEEDLE ASPIRATION (FNA) LINEAR;  Surgeon: Lemar Lofty., MD;  Location: [facility] ENDOSCOPY;  Service: Gastroenterology;  Laterality: N/A;   FOOT SURGERY Bilateral    HOT HEMOSTASIS N/A 07/30/2022   Procedure: HOT HEMOSTASIS (ARGON PLASMA COAGULATION/BICAP);  Surgeon: Meryl Dare, MD;  Location: [facility] ENDOSCOPY;  Service: Gastroenterology;  Laterality: N/A;   IR IMAGING GUIDED PORT INSERTION  05/03/2023   MITRAL VALVE REPAIR N/A 04/19/2022   Procedure: MITRAL VALVE REPAIR WITH PHYSIO II ANNULOPLASTY RING;  Surgeon:  Loreli Slot, MD;  Location[facility] OR;  Service: Open Heart Surgery;  Laterality: N/A;   PERICARDIOCENTESIS N/A 05/10/2022   Procedure: PERICARDIOCENTESIS;  Surgeon: Corky Crafts, MD;  Location: [facility] INVASIVE CV LAB;  Service: Cardiovascular;  Laterality: N/A;   POLYPECTOMY  07/30/2022   Procedure: POLYPECTOMY;  Surgeon: Meryl Dare, MD;  Location: [facility] ENDOSCOPY;  Service: Gastroenterology;;   RIGHT/LEFT HEART CATH AND CORONARY ANGIOGRAPHY N/A 02/12/2022   Procedure: RIGHT/LEFT HEART CATH AND CORONARY ANGIOGRAPHY;  Surgeon: Lyn Records, MD;  Location: [facility] INVASIVE CV LAB;  Service: Cardiovascular;  Laterality: N/A;   TEE WITHOUT CARDIOVERSION N/A 02/03/2022   Procedure: TRANSESOPHAGEAL ECHOCARDIOGRAM (TEE);  Surgeon: Wendall Stade, MD;  Location: [facility] ENDOSCOPY;  Service: Cardiovascular;  Laterality: N/A;   TEE WITHOUT CARDIOVERSION N/A 04/19/2022   Procedure: TRANSESOPHAGEAL ECHOCARDIOGRAM (TEE);  Surgeon: Loreli Slot, MD;  Location: [facility] OR;  Service: Open Heart Surgery;  Laterality: N/A;   TEE WITHOUT CARDIOVERSION N/A 07/28/2022   Procedure: TRANSESOPHAGEAL ECHOCARDIOGRAM (TEE);  Surgeon: Thurmon Fair, MD;  Location: [facility] ENDOSCOPY;  Service: Cardiovascular;  Laterality: N/A;   TRICUSPID VALVE REPLACEMENT N/A 04/19/2022   Procedure: TRICUSPID VALVE REPAIR WITH MC3 ANNULOPLASTY RING;  Surgeon: Loreli Slot, MD;  Location: [facility] OR;  Service: Open Heart Surgery;  Laterality: N/A;     A IV Location/Drains/Wounds Patient Lines/Drains/Airways Status     Active Line/Drains/Airways     Name Placement date Placement time Site Days   Implanted Port 05/03/23 Right Chest 05/03/23  1445  Chest  13            Intake/Output Last 24 hours  Intake/Output Summary (Last 24 hours) at 05/16/2023 0043 Last data filed at 05/15/2023 2123 Gross per 24 hour  Intake 1000 ml  Output --  Net 1000 ml    Labs/Imaging Results for orders placed or performed during the  hospital encounter of 05/15/23 (from the past 48 hour(s))  CBC     Status: Abnormal   Collection Time: 05/15/23  8:26 PM  Result Value Ref Range   WBC 7.7 4.0 - 10.5 K/uL   RBC 3.22 (L) 3.87 - 5.11 MIL/uL   Hemoglobin 10.0 (L) 12.0 - 15.0 g/dL   HCT 16.1 (L) 09.6 - 04.5 %   MCV 93.8 80.0 - 100.0 fL   MCH 31.1 26.0 - 34.0 pg   MCHC 33.1 30.0 - 36.0 g/dL   RDW 40.9 81.1 - 91.4 %   Platelets 165 150 - 400 K/uL   nRBC 0.4 (H) 0.0 - 0.2 %    Comment: Performed at [facility], [address] [place], [place] [address]  Comprehensive metabolic panel     Status: Abnormal   Collection Time: 05/15/23  8:26 PM  Result Value Ref Range   Sodium 129 (L) 135 - 145 mmol/L  Potassium 3.8 3.5 - 5.1 mmol/L   Chloride 97 (L) 98 - 111 mmol/L   CO2 18 (L) 22 - 32 mmol/L   Glucose, Bld 182 (H) 70 - 99 mg/dL    Comment: Glucose reference range applies only to samples taken after fasting for at least 8 hours.   BUN 17 8 - 23 mg/dL   Creatinine, Ser 2.13 (H) 0.44 - 1.00 mg/dL   Calcium 8.4 (L) 8.9 - 10.3 mg/dL   Total Protein 6.0 (L) 6.5 - 8.1 g/dL   Albumin 3.4 (L) 3.5 - 5.0 g/dL   AST 25 15 - 41 U/L   ALT 32 0 - 44 U/L   Alkaline Phosphatase 127 (H) 38 - 126 U/L   Total Bilirubin 0.6 0.3 - 1.2 mg/dL   GFR, Estimated 29 (L) >60 mL/min    Comment: (NOTE) Calculated using the CKD-EPI Creatinine Equation (2021)    Anion gap 14 5 - 15    Comment: Performed at [facility], [address], [place], [place] [address]  Protime-INR     Status: Abnormal   Collection Time: 05/15/23  8:26 PM  Result Value Ref Range   Prothrombin Time 20.6 (H) 11.4 - 15.2 seconds   INR 1.8 (H) 0.8 - 1.2    Comment: (NOTE) INR goal varies based on device and disease states. Performed at [facility], [address] [place], [place] [address]   Type and screen     Status: None   Collection Time: 05/15/23  8:26 PM  Result Value Ref Range   ABO/RH(D) B POS    Antibody Screen NEG    Sample  Expiration      05/18/2023,2359 Performed at [facility], [address], [place], [place] [address]   Lipase, blood     Status: None   Collection Time: 05/15/23  8:26 PM  Result Value Ref Range   Lipase 28 11 - 51 U/L    Comment: Performed at [facility], [address] [place], [place] [address]  Lactic acid, plasma     Status: Abnormal   Collection Time: 05/15/23  8:26 PM  Result Value Ref Range   Lactic Acid, Venous 2.5 (HH) 0.5 - 1.9 mmol/L    Comment: CRITICAL RESULT CALLED TO, READ BACK BY AND VERIFIED WITH Arlyss Queen, RN, 2221, 05/15/23, EADEDOKUN Performed at [facility], [address], [place], [place] [address]   I-stat chem 8, ed     Status: Abnormal   Collection Time: 05/15/23  8:37 PM  Result Value Ref Range   Sodium 131 (L) 135 - 145 mmol/L   Potassium 3.9 3.5 - 5.1 mmol/L   Chloride 99 98 - 111 mmol/L   BUN 18 8 - 23 mg/dL   Creatinine, Ser 7.25 (H) 0.44 - 1.00 mg/dL   Glucose, Bld 366 (H) 70 - 99 mg/dL    Comment: Glucose reference range applies only to samples taken after fasting for at least 8 hours.   Calcium, Ion 1.10 (L) 1.15 - 1.40 mmol/L   TCO2 20 (L) 22 - 32 mmol/L   Hemoglobin 11.6 (L) 12.0 - 15.0 g/dL   HCT 44.0 (L) 34.7 - 42.5 %   CT CHEST ABDOMEN PELVIS W CONTRAST  Result Date: 05/15/2023 CLINICAL DATA:  Polytrauma, blunt EXAM: CT CHEST, ABDOMEN, AND PELVIS WITH CONTRAST TECHNIQUE: Multidetector CT imaging of the chest, abdomen and pelvis was performed following the standard protocol during bolus administration of  intravenous contrast. RADIATION DOSE REDUCTION: This exam was performed according to the departmental dose-optimization program which includes automated exposure control, adjustment of the mA and/or kV according to patient size and/or use of iterative reconstruction technique. CONTRAST:  75mL OMNIPAQUE IOHEXOL 350 MG/ML SOLN COMPARISON:  Abdominopelvic CT 5 days ago 05/10/2023. Chest CT 04/05/2023 FINDINGS: CT CHEST FINDINGS  Cardiovascular: No evidence of acute vascular or aortic injury. Aortic atherosclerosis. Right chest port tip in the SVC. The heart is normal in size. Prosthetic mitral and tricuspid valves. No pericardial effusion. There is no central pulmonary embolus. Mediastinum/Nodes: No mediastinal hemorrhage. No mediastinal adenopathy. Patulous esophagus but no wall thickening. Lungs/Pleura: No pneumothorax. No evidence of pulmonary contusion. No pleural fluid. Again seen scattered pulmonary nodules. Reference right lower lobe nodule measures 9 x 5 mm, series 5, image 123 unchanged. Trachea and central airways are clear. Musculoskeletal: Prior median sternotomy. No acute fracture of the sternum, ribs, included clavicles or shoulder girdles. Thoracic degenerative change without acute fracture. There is no evidence of focal bone lesion. CT ABDOMEN PELVIS FINDINGS Hepatobiliary: No evidence of hepatic injury or perihepatic hematoma. Known hepatic metastatic disease is not significantly changed over the last 5 days. Multiple hypodense liver lesions. The gallbladder is nondistended. Periportal adenopathy is causing mass effect on the main portal vein. Pancreas: Hypodense pancreatic body and tail mass is unchanged from recent exam. There is no evidence of pancreatic injury or inflammation. Peripancreatic adenopathy. Spleen: No splenic injury or perisplenic hematoma. Adrenals/Urinary Tract: No adrenal nodule. No evidence of renal injury or focal renal abnormality. No evidence of bladder injury. There is diminished air within the bladder wall. Persistent but decreased perivesicular air anteriorly. Stomach/Bowel: No evidence of bowel or mesenteric injury. No bowel or gastric outlet obstruction. No bowel inflammation. Small to moderate volume of colonic stool. Vascular/Lymphatic: There is mass effect on the main portal vein from peripancreatic/portacaval adenopathy. The adenopathy was present on prior, however mass effect is new.  Pancreatic/periportal node measures 18 mm, series 8, image 8. Additional prominent portal caval and periportal nodes. 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 Aortic atherosclerosis. No evidence of vascular injury. Reproductive: Hysterectomy without adnexal mass. Other: Persistent but diminished perivesicular air anterior to the bladder. No free fluid or hemorrhage. Musculoskeletal: No acute fracture of the pelvis or lumbar spine. No evidence of focal bone lesion. IMPRESSION: 1. No evidence of acute traumatic injury to the chest, abdomen, or pelvis. 2. Known pancreatic malignancy without significant change over the last 5 days. 3. Known hepatic metastatic disease is not significantly changed. Periportal adenopathy is causing mass effect on the main portal vein. 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 4. Unchanged pulmonary nodules. 5. Persistent but diminished perivesicular air. Aortic Atherosclerosis (ICD10-I70.0). Electronically Signed   By: Narda Rutherford M.D.   On: 05/15/2023 21:27   CT Head Wo Contrast  Result Date: 05/15/2023 CLINICAL DATA:  Head trauma, coagulopathy (Age 1-64y) EXAM: CT HEAD WITHOUT CONTRAST TECHNIQUE: Contiguous axial images were obtained from the base of the skull through the vertex without intravenous contrast. RADIATION DOSE REDUCTION: This exam was performed according to the departmental dose-optimization program which includes automated exposure control, adjustment of the mA and/or kV according to patient size and/or use of iterative reconstruction technique. COMPARISON:  Head CT 08/03/2022 FINDINGS: Brain: No intracranial hemorrhage, mass effect, or midline shift. No hydrocephalus. The basilar cisterns are patent. No evidence of territorial infarct or acute ischemia. No extra-axial or intracranial fluid collection. Vascular: No hyperdense vessel  or unexpected calcification. Skull: No fracture or focal lesion. Sinuses/Orbits: Paranasal sinuses and mastoid air cells  are clear. The visualized orbits are unremarkable. Other: Small right frontal scalp laceration. IMPRESSION: Small right frontal scalp laceration. No acute intracranial abnormality. No skull fracture. Electronically Signed   By: Narda Rutherford M.D.   On: 05/15/2023 21:18   CT Cervical Spine Wo Contrast  Result Date: 05/15/2023 CLINICAL DATA:  Neck trauma, dangerous injury mechanism (Age 83-64y) EXAM: CT CERVICAL SPINE WITHOUT CONTRAST TECHNIQUE: Multidetector CT imaging of the cervical spine was performed without intravenous contrast. Multiplanar CT image reconstructions were also generated. RADIATION DOSE REDUCTION: This exam was performed according to the departmental dose-optimization program which includes automated exposure control, adjustment of the mA and/or kV according to patient size and/or use of iterative reconstruction technique. COMPARISON:  None Available. FINDINGS: Alignment: Normal. Skull base and vertebrae: No acute fracture. Vertebral body heights are maintained. The dens and skull base are intact. Soft tissues and spinal canal: No prevertebral fluid or swelling. No visible canal hematoma. Disc levels: Degenerative disc disease with disc space narrowing and spurring. No high-grade canal stenosis Upper chest: Assessed on concurrent chest CT, reported separately. Other: There is air in the venous vasculature. IMPRESSION: Degenerative disc disease without acute fracture or subluxation of the cervical spine. Electronically Signed   By: Narda Rutherford M.D.   On: 05/15/2023 21:16   DG Pelvis Portable  Result Date: 05/15/2023 CLINICAL DATA:  Status post fall. EXAM: PORTABLE PELVIS 1-2 VIEWS COMPARISON:  None Available. FINDINGS: There is no evidence of pelvic fracture or diastasis. No pelvic bone lesions are seen. Ill-defined surgical sutures are seen overlying the midline of the mid pelvis. IMPRESSION: Negative. Electronically Signed   By: Aram Candela M.D.   On: 05/15/2023 21:16   DG  Chest Port 1 View  Result Date: 05/15/2023 CLINICAL DATA:  Insert epic diagnosis and indication. Fall. EXAM: PORTABLE CHEST 1 VIEW COMPARISON:  Most recent radiographs chest CT 04/05/2023, radiograph 08/12/2022 FINDINGS: Right chest port in place. Prior median sternotomy. Previous cardiac valve repair. Stable heart size and mediastinal contours. No pulmonary edema, pleural fluid or pneumothorax. Pulmonary nodules on CT are not well seen by radiograph. On limited assessment, no acute osseous findings. IMPRESSION: 1. No acute abnormality. 2. Pulmonary nodules on CT are not well seen by radiograph. Electronically Signed   By: Narda Rutherford M.D.   On: 05/15/2023 20:48    Pending Labs Unresulted Labs (From admission, onward)     Start     Ordered   05/16/23 0500  Basic metabolic panel  Tomorrow morning,   R        05/15/23 2313   05/16/23 0500  CBC  Tomorrow morning,   R        05/15/23 2326   05/15/23 2245  Urinalysis, Complete w Microscopic -Urine, Clean Catch  Once,   URGENT       Question:  Specimen Source  Answer:  Urine, Clean Catch   05/15/23 2244   05/15/23 2040  Lactic acid, plasma  (Undifferentiated presentation (screening labs and basic nursing orders))  Now then every 2 hours,   R (with STAT occurrences)      05/15/23 2039   05/15/23 2040  Blood Culture (routine x 2)  (Undifferentiated presentation (screening labs and basic nursing orders))  BLOOD CULTURE X 2,   STAT      05/15/23 2039            Vitals/Pain Today's Vitals  05/15/23 2245 05/16/23 0000 05/16/23 0026 05/16/23 0037  BP: (!) 87/73 (!) 106/91 103/78   Pulse: (!) 119     Resp: 20 15 20    Temp:   (!) 97.5 F (36.4 C)   TempSrc:   Oral   SpO2: 97%  96%   PainSc:    9     Isolation Precautions No active isolations  Medications Medications  sodium chloride flush (NS) 0.9 % injection 10-40 mL (has no administration in time range)  sodium chloride flush (NS) 0.9 % injection 10-40 mL (has no administration  in time range)  Chlorhexidine Gluconate Cloth 2 % PADS 6 each (has no administration in time range)  HYDROmorphone (DILAUDID) injection 0.5-1 mg (1 mg Intravenous Given 05/15/23 2354)  lactated ringers infusion (has no administration in time range)  acetaminophen (TYLENOL) tablet 650 mg (has no administration in time range)    Or  acetaminophen (TYLENOL) suppository 650 mg (has no administration in time range)  ondansetron (ZOFRAN) tablet 4 mg (has no administration in time range)    Or  ondansetron (ZOFRAN) injection 4 mg (has no administration in time range)  lactated ringers bolus 1,000 mL (0 mLs Intravenous Stopped 05/15/23 2123)  lactated ringers bolus 1,000 mL ( Intravenous Restarted 05/15/23 2354)  HYDROmorphone (DILAUDID) injection 0.5 mg (0.5 mg Intravenous Given 05/15/23 2122)  ondansetron (ZOFRAN) injection 4 mg (4 mg Intravenous Given 05/15/23 2122)  iohexol (OMNIPAQUE) 350 MG/ML injection 75 mL (75 mLs Intravenous Contrast Given 05/15/23 2045)  lidocaine-EPINEPHrine (XYLOCAINE W/EPI) 2 %-1:200000 (PF) injection 20 mL (20 mLs Infiltration Given by Other 05/15/23 2229)  lactated ringers bolus 1,000 mL (1,000 mLs Intravenous New Bag/Given 05/15/23 2357)  HYDROmorphone (DILAUDID) injection 0.5 mg (0.5 mg Intravenous Given 05/15/23 2212)    Mobility walks with device     Focused Assessments Cardiac Assessment Handoff:  Cardiac Rhythm: Sinus tachycardia Lab Results  Component Value Date   CKTOTAL 29 (L) 08/07/2022   CKMB 0.6 06/11/2010   TROPONINI 0.01        NO INDICATION OF MYOCARDIAL INJURY. 06/11/2010   Lab Results  Component Value Date   DDIMER 4.41 (H) 05/25/2022   Does the Patient currently have chest pain? No    R Recommendations: See Admitting Provider Note  Report given to:   Additional Notes: Pt fell at home on blood thinners, laceration to right side of forehead.

## 2023-05-16 NOTE — TOC CAGE-AID Note (Signed)
Transition of Care [facility]) - CAGE-AID Screening   Patient Details  Name: [name] MRN: [id number] Date of Birth: [birth year]  Transition of [facility]) CM/SW Contact:    Leota Sauers, RN Phone Number: 05/16/2023, 12:37 AM   Clinical Narrative:  Patient denies use of alcohol and illicit drugs. Education not offered at this time  CAGE-AID Screening:    Have You Ever Felt You Ought to Cut Down on Your Drinking or Drug Use?: No Have People Annoyed You By [employer] Your Drinking Or Drug Use?: No Have You Felt Bad Or Guilty About Your Drinking Or Drug Use?: No Have You Ever Had a Drink or Used Drugs First Thing In The Morning to Steady Your Nerves or to Get Rid of a Hangover?: No CAGE-AID Score: 0  Substance Abuse Education Offered: No

## 2023-05-16 NOTE — Progress Notes (Signed)
ANTICOAGULATION CONSULT NOTE - Initial Consult  Pharmacy Consult for warfarin Indication: atrial fibrillation  Allergies  Allergen Reactions   Norco [Hydrocodone-Acetaminophen] Itching and Other (See Comments)    Tolerates Oxycodone    Patient Measurements: Height: 5\' 11"  (180.3 cm) Weight: 77 kg (169 lb 12.1 oz) IBW/kg (Calculated) : 70.8  Vital Signs: Temp: 97.5 F (36.4 C) (06/10 0026) Temp Source: Oral (06/10 0026) BP: 111/88 (06/10 0030) Pulse Rate: 119 (06/09 2245)  Labs: Recent Labs    05/13/23 0334 05/15/23 2026 05/15/23 2037  HGB 10.2* 10.0* 11.6*  HCT 30.5* 30.2* 34.0*  PLT 131* 165  --   LABPROT 17.8* 20.6*  --   INR 1.4* 1.8*  --   CREATININE  --  1.90* 1.90*    Estimated Creatinine Clearance: 33.9 mL/min (A) (by C-G formula based on SCr of 1.9 mg/dL (H)).   Medical History: Past Medical History:  Diagnosis Date   Acute pericardial effusion 05/17/2022   AKI (acute kidney injury) (HCC) 05/17/2022   Anxiety    Arthritis    Cervicalgia    CHF (congestive heart failure) (HCC)    Chondromalacia of patella    Chronic neck pain    Chronic pain syndrome    Depression    secondary to loss of her son at age 47   Diabetic neuropathy (HCC)    DMII (diabetes mellitus, type 2) (HCC)    Dysrhythmia    Enthesopathy of hip region    Fibromyalgia    GERD (gastroesophageal reflux disease)    Hep C w/o coma, chronic (HCC)    Hepatitis C    diagnosed 2005   Hypertension    Insomnia    Low back pain    Lumbago    Mitral regurgitation    Obesity    Pain in joint, upper arm    Primary localized osteoarthrosis, lower leg    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 355 mL 0    ARIPiprazole (ABILIFY) 5 MG tablet Take 2.5 mg by mouth daily.      Blood Glucose Monitoring Suppl DEVI 1 each by Does not apply route in the morning, at noon, and at bedtime. May substitute to any manufacturer covered by patient's insurance. 1 each 0    buPROPion (WELLBUTRIN XL) 300 MG 24 hr tablet Take 300 mg by mouth in the morning.      dexlansoprazole (DEXILANT) 60 MG capsule Take 60 mg by mouth daily.      diclofenac Sodium (VOLTAREN) 1 % GEL Apply 2 g topically 4 (four) times daily as needed (pain).      gabapentin (NEURONTIN) 300 MG capsule Take 300 mg by mouth 3 (three) times daily.      Glucose Blood (BLOOD GLUCOSE TEST STRIPS) STRP 1 each by In Vitro route in the morning, at noon, and at bedtime. May substitute to any manufacturer covered by patient's insurance. 100 each 0    hydrALAZINE (APRESOLINE) 25 MG tablet Take 1 tablet (25 mg total) by mouth daily as needed (BP>150). 30 tablet 3    insulin glargine (LANTUS) 100 UNIT/ML  Solostar Pen Inject 15 Units into the skin daily. 15 mL 0    Insulin Pen Needle 31G X 5 MM MISC Lantus 15 units daily 100 each 0    linagliptin (TRADJENTA) 5 MG TABS tablet Take 5 mg by mouth daily.      metoprolol tartrate (LOPRESSOR) 25 MG tablet Take 0.5 tablets (12.5 mg total) by mouth 2 (two) times daily. 60 tablet 3    naloxone (NARCAN) nasal spray 4 mg/0.1 mL PRN for suspected opiate OD 1 each 0    oxyCODONE ER (XTAMPZA ER) 36 MG C12A Take 1 capsule (36 mg total) by mouth every 8 (eight) hours. 90 capsule 0    Oxycodone HCl 10 MG TABS Take 1-2 tablets (10-20 mg total) by mouth every 4 (four) hours as needed. 60 tablet 0    Oxycodone HCl 10 MG TABS Take 1-2 tablets (10-20 mg total) by mouth every 3 (three) hours as directed for pain. 60 tablet 0    Oyster Shell (OYSTER CALCIUM) 500 MG TABS tablet Take 500 mg of elemental calcium by mouth daily.       polyethylene glycol powder (GLYCOLAX/MIRALAX) 17 GM/SCOOP powder Take 17 g by mouth daily. 238 g 0    PREMARIN 0.45 MG tablet Take 0.45 mg by mouth daily.      QUEtiapine (SEROQUEL) 25 MG tablet Take 25 mg by mouth as needed.      senna (SENOKOT) 8.6 MG TABS tablet Take 2 tablets (17.2 mg total) by mouth daily. 30 tablet 0    tobramycin-dexamethasone (TOBRADEX) ophthalmic solution 1 drop 4 (four) times daily.      traZODone (DESYREL) 100 MG tablet Take 200 mg by mouth at bedtime. Pt takes 2 tablets 200 mg at bedtime.      TRINTELLIX 10 MG TABS tablet Take 10 mg by mouth daily.      warfarin (COUMADIN) 5 MG tablet Take 5 mg by mouth as directed. Take 5mg  po daily, except 2.5mg  on Sunday.      Scheduled:   Chlorhexidine Gluconate Cloth  6 each Topical Daily   insulin aspart  0-9 Units Subcutaneous Q4H   insulin glargine-yfgn  10 Units Subcutaneous Daily   oxyCODONE  40 mg Oral Q12H   sodium chloride flush  10-40 mL Intracatheter Q12H   Infusions:   lactated ringers 75 mL/hr at 05/16/23 0117    Assessment: 62yo female admitted w/ AKI and hypotension, to continue warfarin for Afib; current INR slightly below goal.  Goal of Therapy:  INR 2-3 Monitor platelets by anticoagulation protocol: Yes   Plan:  Warfarin 7.5mg  po x1 this afternoon. Monitor INR for dose adjustments.  Vernard Gambles, PharmD, BCPS  05/16/2023,3:25 AM

## 2023-05-16 NOTE — Assessment & Plan Note (Signed)
Lantus 10u daily SSI sensitive Q4H Will let pt eat, but sounds like she's not eating much at this stage (ensures and that's about it).

## 2023-05-16 NOTE — Assessment & Plan Note (Signed)
Pt on hospice at home currently. Sounds like she's not having adequate PO fluid intake secondary to cancer pain and feeling full since discharge 2 days ago. Cont Oxycontin long acting Hold PO oxy short acting Using IV dilaudid for pain for the moment since this seems to work better for her (or worked well in ED anyhow). Will put in for pal care to see her in AM again. Question need for placement given poor results going home. PT/OT if not ready for placement / IP hospice candidate.

## 2023-05-16 NOTE — Discharge Summary (Signed)
Physician Discharge Summary  [name] ZOX:[id number] DOB: [birth year] DOA: 05/15/2023  PCP: Nicole Racer, MD  Admit date: 05/15/2023  Discharge date: 05/16/2023  Admitted From:Home  Disposition:  Hospice facility  Recommendations for Outpatient Follow-up:  Follow up with hospice  Home Health:None  Equipment/Devices:NOne  Discharge Condition:Stable  CODE STATUS: Full  Diet recommendation: Regular  Brief/Interim Summary: [name] is a 62 y.o. female with medical history significant of stage 4 pancreatic cancer, PAF on coumadin, HTN, DM2.   Pt recently admitted 6/4-6/7 with UTI, treated with rocephin for a sensitive e.coli, discharged home with home hospice.  Per that DC summary: "She was evaluated by physical therapy as well during hospitalization and not felt to warrant needing SNF. Patient was unable to afford assisted or independent living and therefore she was arranged for home health aide and RN at discharge as well as establishing care with hospice. Given her poor prognosis and expected physical decline, she may warrant further hospitalization and/or placement with residential hospice once she is no longer able to live alone as she has no reliable care from family/friends."    Since discharge she has had poor oral intake and worsening generalized weakness.  She was admitted with AKI-prerenal in the setting of hypotension and poor oral intake in the setting of her advanced pancreatic cancer.  She was started on IV fluid with improvement in creatinine levels noted, but not quite at baseline. She has been seen by palliative care and is now eligible for discharge to [facility] residential hospice facility.  Discharge Diagnoses:  Principal Problem:   AKI (acute kidney injury) (HCC) Active Problems:   Pancreatic cancer (HCC)   Cancer related pain   Type 2 diabetes mellitus with hyperlipidemia (HCC)   Paroxysmal atrial fibrillation (HCC)  Prinicipal Discharge  Diagnosis: Pre-renal AKI due to poor oral intake in setting of stage 4 pancreatic cancer.  Discharge Instructions  Discharge Instructions     Diet - low sodium heart healthy   Complete by: As directed    If the dressing is still on your incision site when you go home, remove it on the third day after your surgery date. Remove dressing if it begins to fall off, or if it is dirty or damaged before the third day.   Complete by: As directed    Increase activity slowly   Complete by: As directed       Allergies as of 05/16/2023       Reactions   Norco [hydrocodone-acetaminophen] Itching, Other (See Comments)   Tolerates Oxycodone        Medication List     STOP taking these medications    ARIPiprazole 5 MG tablet Commonly known as: ABILIFY   Blood Glucose Monitoring Suppl Devi   BLOOD GLUCOSE TEST STRIPS Strp   buPROPion 300 MG 24 hr tablet Commonly known as: WELLBUTRIN XL   dexlansoprazole 60 MG capsule Commonly known as: DEXILANT   gabapentin 300 MG capsule Commonly known as: NEURONTIN   Geri-Lanta 200-200-20 MG/5ML suspension Generic drug: alum & mag hydroxide-simeth   hydrALAZINE 25 MG tablet Commonly known as: APRESOLINE   insulin glargine 100 UNIT/ML Solostar Pen Commonly known as: LANTUS   Insulin Pen Needle 31G X 5 MM Misc   linagliptin 5 MG Tabs tablet Commonly known as: TRADJENTA   metoprolol tartrate 25 MG tablet Commonly known as: LOPRESSOR   naloxone 4 MG/0.1ML Liqd nasal spray kit Commonly known as: NARCAN   Oxycodone HCl 10 MG Tabs   oyster  calcium 500 MG Tabs tablet   polyethylene glycol powder 17 GM/SCOOP powder Commonly known as: GLYCOLAX/MIRALAX   Premarin 0.45 MG tablet Generic drug: estrogens (conjugated)   QUEtiapine 25 MG tablet Commonly known as: SEROQUEL   senna 8.6 MG Tabs tablet Commonly known as: SENOKOT   tobramycin-dexamethasone ophthalmic solution Commonly known as: TOBRADEX   traZODone 100 MG  tablet Commonly known as: DESYREL   Trintellix 10 MG Tabs tablet Generic drug: vortioxetine HBr   Voltaren 1 % Gel Generic drug: diclofenac Sodium   warfarin 5 MG tablet Commonly known as: COUMADIN   Xtampza ER 36 MG C12a Generic drug: oxyCODONE ER               Discharge Care Instructions  (From admission, onward)           Start     Ordered   05/16/23 0000  If the dressing is still on your incision site when you go home, remove it on the third day after your surgery date. Remove dressing if it begins to fall off, or if it is dirty or damaged before the third day.        05/16/23 1152            Allergies  Allergen Reactions   Norco [Hydrocodone-Acetaminophen] Itching and Other (See Comments)    Tolerates Oxycodone    Consultations: Palliative Care   Procedures/Studies: CT CHEST ABDOMEN PELVIS W CONTRAST  Result Date: 05/15/2023 CLINICAL DATA:  Polytrauma, blunt EXAM: CT CHEST, ABDOMEN, AND PELVIS WITH CONTRAST TECHNIQUE: Multidetector CT imaging of the chest, abdomen and pelvis was performed following the standard protocol during bolus administration of intravenous contrast. RADIATION DOSE REDUCTION: This exam was performed according to the departmental dose-optimization program which includes automated exposure control, adjustment of the mA and/or kV according to patient size and/or use of iterative reconstruction technique. CONTRAST:  75mL OMNIPAQUE IOHEXOL 350 MG/ML SOLN COMPARISON:  Abdominopelvic CT 5 days ago 05/10/2023. Chest CT 04/05/2023 FINDINGS: CT CHEST FINDINGS Cardiovascular: No evidence of acute vascular or aortic injury. Aortic atherosclerosis. Right chest port tip in the SVC. The heart is normal in size. Prosthetic mitral and tricuspid valves. No pericardial effusion. There is no central pulmonary embolus. Mediastinum/Nodes: No mediastinal hemorrhage. No mediastinal adenopathy. Patulous esophagus but no wall thickening. Lungs/Pleura: No  pneumothorax. No evidence of pulmonary contusion. No pleural fluid. Again seen scattered pulmonary nodules. Reference right lower lobe nodule measures 9 x 5 mm, series 5, image 123 unchanged. Trachea and central airways are clear. Musculoskeletal: Prior median sternotomy. No acute fracture of the sternum, ribs, included clavicles or shoulder girdles. Thoracic degenerative change without acute fracture. There is no evidence of focal bone lesion. CT ABDOMEN PELVIS FINDINGS Hepatobiliary: No evidence of hepatic injury or perihepatic hematoma. Known hepatic metastatic disease is not significantly changed over the last 5 days. Multiple hypodense liver lesions. The gallbladder is nondistended. Periportal adenopathy is causing mass effect on the main portal vein. Pancreas: Hypodense pancreatic body and tail mass is unchanged from recent exam. There is no evidence of pancreatic injury or inflammation. Peripancreatic adenopathy. Spleen: No splenic injury or perisplenic hematoma. Adrenals/Urinary Tract: No adrenal nodule. No evidence of renal injury or focal renal abnormality. No evidence of bladder injury. There is diminished air within the bladder wall. Persistent but decreased perivesicular air anteriorly. Stomach/Bowel: No evidence of bowel or mesenteric injury. No bowel or gastric outlet obstruction. No bowel inflammation. Small to moderate volume of colonic stool. Vascular/Lymphatic: There is mass effect on  the main portal vein from peripancreatic/portacaval adenopathy. The adenopathy was present on prior, however mass effect is new. Pancreatic/periportal node measures 18 mm, series 8, image 8. Additional prominent portal caval and periportal nodes. 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 Aortic atherosclerosis. No evidence of vascular injury. Reproductive: Hysterectomy without adnexal mass. Other: Persistent but diminished perivesicular air anterior to the bladder. No free fluid or hemorrhage. Musculoskeletal:  No acute fracture of the pelvis or lumbar spine. No evidence of focal bone lesion. IMPRESSION: 1. No evidence of acute traumatic injury to the chest, abdomen, or pelvis. 2. Known pancreatic malignancy without significant change over the last 5 days. 3. Known hepatic metastatic disease is not significantly changed. Periportal adenopathy is causing mass effect on the main portal vein. 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 4. Unchanged pulmonary nodules. 5. Persistent but diminished perivesicular air. Aortic Atherosclerosis (ICD10-I70.0). Electronically Signed   By: Narda Rutherford M.D.   On: 05/15/2023 21:27   CT Head Wo Contrast  Result Date: 05/15/2023 CLINICAL DATA:  Head trauma, coagulopathy (Age 63-64y) EXAM: CT HEAD WITHOUT CONTRAST TECHNIQUE: Contiguous axial images were obtained from the base of the skull through the vertex without intravenous contrast. RADIATION DOSE REDUCTION: This exam was performed according to the departmental dose-optimization program which includes automated exposure control, adjustment of the mA and/or kV according to patient size and/or use of iterative reconstruction technique. COMPARISON:  Head CT 08/03/2022 FINDINGS: Brain: No intracranial hemorrhage, mass effect, or midline shift. No hydrocephalus. The basilar cisterns are patent. No evidence of territorial infarct or acute ischemia. No extra-axial or intracranial fluid collection. Vascular: No hyperdense vessel or unexpected calcification. Skull: No fracture or focal lesion. Sinuses/Orbits: Paranasal sinuses and mastoid air cells are clear. The visualized orbits are unremarkable. Other: Small right frontal scalp laceration. IMPRESSION: Small right frontal scalp laceration. No acute intracranial abnormality. No skull fracture. Electronically Signed   By: Narda Rutherford M.D.   On: 05/15/2023 21:18   CT Cervical Spine Wo Contrast  Result Date: 05/15/2023 CLINICAL DATA:  Neck trauma, dangerous injury mechanism (Age  24-64y) EXAM: CT CERVICAL SPINE WITHOUT CONTRAST TECHNIQUE: Multidetector CT imaging of the cervical spine was performed without intravenous contrast. Multiplanar CT image reconstructions were also generated. RADIATION DOSE REDUCTION: This exam was performed according to the departmental dose-optimization program which includes automated exposure control, adjustment of the mA and/or kV according to patient size and/or use of iterative reconstruction technique. COMPARISON:  None Available. FINDINGS: Alignment: Normal. Skull base and vertebrae: No acute fracture. Vertebral body heights are maintained. The dens and skull base are intact. Soft tissues and spinal canal: No prevertebral fluid or swelling. No visible canal hematoma. Disc levels: Degenerative disc disease with disc space narrowing and spurring. No high-grade canal stenosis Upper chest: Assessed on concurrent chest CT, reported separately. Other: There is air in the venous vasculature. IMPRESSION: Degenerative disc disease without acute fracture or subluxation of the cervical spine. Electronically Signed   By: Narda Rutherford M.D.   On: 05/15/2023 21:16   DG Pelvis Portable  Result Date: 05/15/2023 CLINICAL DATA:  Status post fall. EXAM: PORTABLE PELVIS 1-2 VIEWS COMPARISON:  None Available. FINDINGS: There is no evidence of pelvic fracture or diastasis. No pelvic bone lesions are seen. Ill-defined surgical sutures are seen overlying the midline of the mid pelvis. IMPRESSION: Negative. Electronically Signed   By: Aram Candela M.D.   On: 05/15/2023 21:16   DG Chest Port 1 View  Result Date: 05/15/2023  CLINICAL DATA:  Insert epic diagnosis and indication. Fall. EXAM: PORTABLE CHEST 1 VIEW COMPARISON:  Most recent radiographs chest CT 04/05/2023, radiograph 08/12/2022 FINDINGS: Right chest port in place. Prior median sternotomy. Previous cardiac valve repair. Stable heart size and mediastinal contours. No pulmonary edema, pleural fluid or  pneumothorax. Pulmonary nodules on CT are not well seen by radiograph. On limited assessment, no acute osseous findings. IMPRESSION: 1. No acute abnormality. 2. Pulmonary nodules on CT are not well seen by radiograph. Electronically Signed   By: Narda Rutherford M.D.   On: 05/15/2023 20:48   CT ABDOMEN PELVIS W CONTRAST  Result Date: 05/10/2023 CLINICAL DATA:  Abdominal pain for several weeks, initial encounter EXAM: CT ABDOMEN AND PELVIS WITH CONTRAST TECHNIQUE: Multidetector CT imaging of the abdomen and pelvis was performed using the standard protocol following bolus administration of intravenous contrast. RADIATION DOSE REDUCTION: This exam was performed according to the departmental dose-optimization program which includes automated exposure control, adjustment of the mA and/or kV according to patient size and/or use of iterative reconstruction technique. CONTRAST:  OMNIPAQUE IOHEXOL 300 MG/ML  SOLN COMPARISON:  04/24/2023 FINDINGS: Lower chest: Lung bases show no focal infiltrate or sizable effusion. A pleural based nodule is noted which measures approximately 6 mm which is stable from the prior exam. Hepatobiliary: Multiple peripherally enhancing lesions are identified throughout the liver similar to that seen on prior CT examination consistent with metastatic disease. The gallbladder is decompressed. Pancreas: Pancreas again demonstrates a predominately hypodense mass lesion within the body and tail of the pancreas similar to that seen on prior CT examination consistent with the known pancreatic malignancy. Spleen: Normal in size without focal abnormality. Adrenals/Urinary Tract: Adrenal glands are within normal limits. Kidneys demonstrate a normal enhancement pattern bilaterally. Normal excretion is noted. No renal calculi or obstructive changes are seen. The bladder is decompressed. There is air in within the anterior aspect of the bladder wall suspicious for emphysematous cystitis. Correlate with  urinalysis. These changes are new from the prior exam. Stomach/Bowel: No obstructive or inflammatory changes of the colon are seen. The appendix is not well visualized and may have been surgically removed. No inflammatory changes are seen. Small bowel and stomach are unremarkable. Vascular/Lymphatic: Atherosclerotic calcifications of the abdominal aorta are noted. No aneurysmal dilatation is seen. There is again noted encasement of the celiac axis as well as a fat plane adjacent to the superior mesenteric artery consistent with localized neoplastic invasion. 18 mm short axis node is noted in the portacaval space stable from the prior exam. Splenic vein occlusion is not again seen with some collateral flow identified. Reproductive: Status post hysterectomy. No adnexal masses. Other: No abdominal wall hernia or abnormality. No abdominopelvic ascites. Musculoskeletal: Degenerative changes of lumbar spine are seen. IMPRESSION: Changes consistent with the known history of pancreatic neoplasm stable in appearance from the recent exam. Associated vascular encasement is noted as well as splenic vein occlusion and adjacent portacaval adenopathy. Hepatic metastatic disease is again identified and stable. Stable 6 mm subpleural nodule in the right lower lobe. This is stable dating back to January of 2022 and likely of a benign etiology. No acute abnormality is noted. Electronically Signed   By: Alcide Clever M.D.   On: 05/10/2023 19:26   CT CELIAC PLEXUS BLOCK NEUROLYTIC  Result Date: 05/04/2023 CLINICAL DATA:  Refractory abdominal pain secondary to metastatic pancreatic carcinoma. The patient presents for celiac block with planned neurolysis of the celiac plexus. EXAM: CT GUIDED NEUROLYTIC ABLATION OF THE CELIAC  AXIS ANESTHESIA/SEDATION: Moderate (conscious) sedation was employed during this procedure. A total of Versed 2.0 mg and Fentanyl 100 mcg was administered intravenously. Moderate Sedation Time: 23 minutes. The  patient's level of consciousness and vital signs were monitored continuously by radiology nursing throughout the procedure under my direct supervision. PROCEDURE: The procedure risks, benefits, and alternatives were explained to the patient. Questions regarding the procedure were encouraged and answered. The patient understands and consents to the procedure. A time-out was performed prior to initiating the procedure. CT was performed in a supine position through the upper to mid abdomen. The anterior abdominal wall was prepped with chlorhexidine in a sterile fashion, and a sterile drape was applied covering the operative field. A sterile gown and sterile gloves were used for the procedure. Local anesthesia was provided with 1% Lidocaine. A 22 gauge Chiba needle was advanced under CT guidance to the level of the celiac plexus. After confirming needle tip position, approximately three ml of a 1:10 dilution of Omnipaque-300 contrast and saline was injected. Spread of diluted contrast material was confirmed by CT. A mixture of 5 ml of 0.5% Sensorcaine and 80 mg Depo-Medrol was then made. This was injected through the needle. Alcohol neurolytic ablation of the celiac plexus was then performed with injection of 20 ml of absolute alcohol. COMPLICATIONS: None FINDINGS: Contrast injection showed excellent spread across the midline at the level of the celiac plexus. Alcohol ablation was successfully performed. IMPRESSION: CT guided neurolytic ablation of the celiac plexus as above. Alcohol ablation was performed with 20 ml of absolute alcohol. Therapeutic injection of Sensorcaine and Depo-Medrol was also performed just prior to alcohol ablation. Electronically Signed   By: Irish Lack M.D.   On: 05/04/2023 14:17   US BIOPSY (LIVER)  Result Date: 05/03/2023 INDICATION: 62 year old with pancreatic mass and liver lesions. Probable metastatic pancreatic cancer. Port-A-Cath was placed immediately prior to this procedure.  EXAM: ULTRASOUND-GUIDED LIVER LESION BIOPSY MEDICATIONS: Moderate sedation ANESTHESIA/SEDATION: Moderate (conscious) sedation was employed during this procedure. A total of Versed 1 mg was administered intravenously by the radiology nurse. Additional Versed and fentanyl was given immediately prior to this procedure for the Port-A-Cath placement. Total intra-service moderate Sedation Time: 20 minutes. The patient's level of consciousness and vital signs were monitored continuously by radiology nursing throughout the procedure under my direct supervision. FLUOROSCOPY TIME:  None COMPLICATIONS: None immediate. PROCEDURE: Informed written consent was obtained from the patient after a thorough discussion of the procedural risks, benefits and alternatives. All questions were addressed. Maximal Sterile Barrier Technique was utilized including caps, mask, sterile gowns, sterile gloves, sterile drape, hand hygiene and skin antiseptic. A timeout was performed prior to the initiation of the procedure. Ultrasound demonstrated subtle hypoechoic liver lesions. Right hepatic lesion was targeted. Right side of the abdomen was prepped with chlorhexidine and sterile field was created. Skin was anesthetized using 1% lidocaine. Small incision was made. Using ultrasound guidance, 17 gauge coaxial needle was directed into the right hepatic lobe and a hypoechoic lesion. Three core biopsies were obtained with an 18 gauge core device. Specimens placed in formalin. 17 gauge needle was removed without complication. Bandage placed over the puncture site. FINDINGS: Subtle hypoechoic lesions in the liver. Core biopsy needle was confirmed within a small right hepatic lesion. No immediate bleeding or hematoma formation. IMPRESSION: Ultrasound-guided core biopsy of a right hepatic lesion. Electronically Signed   By: Richarda Overlie M.D.   On: 05/03/2023 21:38   IR IMAGING GUIDED PORT INSERTION  Result Date: 05/03/2023  INDICATION: 62 year old with  pancreatic mass and liver lesions. Probable metastatic pancreatic cancer. Plan for placement of Port-A-Cath and liver lesion biopsy. EXAM: FLUOROSCOPIC AND ULTRASOUND GUIDED PLACEMENT OF A SUBCUTANEOUS PORT COMPARISON:  None Available. MEDICATIONS: Moderate sedation ANESTHESIA/SEDATION: Moderate (conscious) sedation was employed during this procedure. A total of Versed 1.75 mg and fentanyl 100 mcg was administered intravenously at the order of the provider performing the procedure. Total intra-service moderate sedation time: 29 minutes. Patient's level of consciousness and vital signs were monitored continuously by radiology nurse throughout the procedure under the supervision of the provider performing the procedure. FLUOROSCOPY TIME:  Radiation Exposure Index (as provided by the fluoroscopic device): 1 mGy Kerma COMPLICATIONS: None immediate. PROCEDURE: The procedure, risks, benefits, and alternatives were explained to the patient. Questions regarding the procedure were encouraged and answered. The patient understands and consents to the procedure. Patient was placed supine on the interventional table. Ultrasound confirmed a patent right internal jugular vein. Ultrasound image was saved for documentation. The right chest and neck were cleaned with a skin antiseptic and a sterile drape was placed. Maximal barrier sterile technique was utilized including caps, mask, sterile gowns, sterile gloves, sterile drape, hand hygiene and skin antiseptic. The right neck was anesthetized with 1% lidocaine. Small incision was made in the right neck with a blade. Micropuncture set was placed in the right internal jugular vein with ultrasound guidance. The micropuncture wire was used for measurement purposes. The right chest was anesthetized with 1% lidocaine with epinephrine. #15 blade was used to make an incision and a subcutaneous port pocket was formed. 8 french Power Port was assembled. Subcutaneous tunnel was formed with a  stiff tunneling device. The port catheter was brought through the subcutaneous tunnel. The port was placed in the subcutaneous pocket. The micropuncture set was exchanged for a peel-away sheath. The catheter was placed through the peel-away sheath and the tip was positioned at the superior cavoatrial junction. Catheter placement was confirmed with fluoroscopy. The port was accessed and flushed with saline. The port pocket was closed using two layers of absorbable sutures and Dermabond. The vein skin site was closed using a single layer of absorbable suture and Dermabond. Sterile dressings were applied. Patient tolerated the procedure well without an immediate complication. Ultrasound and fluoroscopic images were taken and saved for this procedure. IMPRESSION: Placement of a subcutaneous power-injectable port device. Catheter tip at the superior cavoatrial junction. Electronically Signed   By: Richarda Overlie M.D.   On: 05/03/2023 21:35   CT ABDOMEN PELVIS W CONTRAST  Result Date: 04/24/2023 CLINICAL DATA:  Abdominal pain.  Metastatic pancreatic cancer. EXAM: CT ABDOMEN AND PELVIS WITH CONTRAST TECHNIQUE: Multidetector CT imaging of the abdomen and pelvis was performed using the standard protocol following bolus administration of intravenous contrast. RADIATION DOSE REDUCTION: This exam was performed according to the departmental dose-optimization program which includes automated exposure control, adjustment of the mA and/or kV according to patient size and/or use of iterative reconstruction technique. CONTRAST:  OMNIPAQUE IOHEXOL 300 MG/ML  SOLN COMPARISON:  CT abdomen pelvis dated 04/03/2023. FINDINGS: Lower chest: Similar appearance of right lung base subpleural nodules measure up to 8 mm. Mitral and aortic valve repairs. No intra-abdominal free air or free fluid. Hepatobiliary: Multiple hypoenhancing hepatic metastatic disease. These lesions appear more conspicuous or have increased since the prior CT. No  biliary dilatation. The gallbladder is unremarkable. Pancreas: Hypoenhancing mass involving the body and tail of the pancreas as seen previously in keeping with known malignancy. Spleen: Normal  in size without focal abnormality. Adrenals/Urinary Tract: The adrenal glands are unremarkable. There is no hydronephrosis on either side. There is symmetric enhancement and excretion of contrast by both kidneys. The visualized ureters and the bladder appear unremarkable. Stomach/Bowel: Postsurgical changes of bowel with anastomotic suture in the pelvis. There is no bowel obstruction or active inflammation. The appendix is not visualized with certainty. No inflammatory changes identified in the right lower quadrant. Vascular/Lymphatic: Mild aortoiliac atherosclerotic disease. There is tumor encasement of the celiac trunk. There is infiltration of the fat plane around the proximal SMA suggestive of encasement. The celiac artery, SMA, IMA are patent. The renal arteries are patent. There is in case mint of the proximal half of the splenic artery with mass effect and high-grade luminal narrowing. The splenic vein is not visualized and appears thrombosed. The SMV and main portal vein are patent. A 2.7 x 2.0 cm hypoenhancing soft tissue abutting the main portal vein (25/2) may represent an enlarged and metastatic adenopathy versus infiltrative tumor. No portal venous gas. Reproductive: Hysterectomy.  No adnexal masses. Other: Midline vertical anterior pelvic wall incisional scar. Musculoskeletal: Degenerative changes of the spine. No acute osseous pathology. IMPRESSION: 1. No acute intra-abdominal or pelvic pathology. 2. Hypoenhancing mass involving the body and tail of the pancreas in keeping with known malignancy. 3. Multiple hypoenhancing hepatic metastatic disease. These lesions appear more conspicuous or have increased since the prior CT. 4. 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 5. Occluded  splenic vein. 6. Similar appearance of right lung base subpleural nodules measure up to 8 mm. 7.  Aortic Atherosclerosis (ICD10-I70.0). Electronically Signed   By: Elgie Collard M.D.   On: 04/24/2023 21:06     Discharge Exam: Vitals:   05/16/23 1057 05/16/23 1117  BP:  (!) 86/75  Pulse: (!) 126 (!) 118  Resp:  18  Temp:  97.9 F (36.6 C)  SpO2:  100%   Vitals:   05/16/23 1048 05/16/23 1050 05/16/23 1057 05/16/23 1117  BP: 93/74 93/74  (!) 86/75  Pulse: (!) 123 (!) 138 (!) 126 (!) 118  Resp:    18  Temp:    97.9 F (36.6 C)  TempSrc:      SpO2:  100%  100%  Weight:      Height:        General: Pt is alert, awake, not in acute distress Cardiovascular: RRR, S1/S2 +, no rubs, no gallops Respiratory: CTA bilaterally, no wheezing, no rhonchi Abdominal: Soft, NT, ND, bowel sounds + Extremities: no edema, no cyanosis    The results of significant diagnostics from this hospitalization (including imaging, microbiology, ancillary and laboratory) are listed below for reference.     Microbiology: Recent Results (from the past 240 hour(s))  Urine Culture (for pregnant, neutropenic or urologic patients or patients with an indwelling urinary catheter)     Status: Abnormal   Collection Time: 05/10/23  5:25 PM   Specimen: Urine, Clean Catch  Result Value Ref Range Status   Specimen Description   Final    URINE, CLEAN CATCH Performed at [facility], [address], [place], [place] [address]    Special Requests   Final    NONE Performed at [facility], [address], [place], [place] [address]    Culture >=100,000 COLONIES/mL ESCHERICHIA COLI (A)  Final   Report Status 05/12/2023 FINAL  Final   Organism ID, Bacteria ESCHERICHIA COLI (A)  Final  Susceptibility   Escherichia coli - MIC*    AMPICILLIN >=32 RESISTANT Resistant     CEFAZOLIN <=4 SENSITIVE Sensitive     CEFEPIME <=0.12 SENSITIVE Sensitive     CEFTRIAXONE <=0.25  SENSITIVE Sensitive     CIPROFLOXACIN >=4 RESISTANT Resistant     GENTAMICIN <=1 SENSITIVE Sensitive     IMIPENEM <=0.25 SENSITIVE Sensitive     NITROFURANTOIN <=16 SENSITIVE Sensitive     TRIMETH/SULFA >=320 RESISTANT Resistant     AMPICILLIN/SULBACTAM 16 INTERMEDIATE Intermediate     PIP/TAZO <=4 SENSITIVE Sensitive     * >=100,000 COLONIES/mL ESCHERICHIA COLI  Blood culture (routine x 2)     Status: None   Collection Time: 05/10/23  9:10 PM   Specimen: BLOOD  Result Value Ref Range Status   Specimen Description   Final    BLOOD BLOOD RIGHT ARM Performed at [facility], [address], [place], [place] [address]    Special Requests   Final    BOTTLES DRAWN AEROBIC AND ANAEROBIC Blood Culture results may not be optimal due to an inadequate volume of blood received in culture bottles Performed at [facility], [address], [place], [place] [address]    Culture   Final    NO GROWTH 5 DAYS Performed at [facility], [address] [place], [place] [address]    Report Status 05/15/2023 FINAL  Final  Blood culture (routine x 2)     Status: None   Collection Time: 05/11/23  4:46 AM   Specimen: BLOOD  Result Value Ref Range Status   Specimen Description   Final    BLOOD BLOOD RIGHT ARM AEROBIC BOTTLE ONLY Performed at [facility], [address] [place], [place] [address]    Special Requests   Final    BOTTLES DRAWN AEROBIC ONLY Blood Culture adequate volume Performed at [facility], [address], [place], [place] [address]    Culture   Final    NO GROWTH 5 DAYS Performed at [facility], [address], [place], [place] [address]    Report Status 05/16/2023 FINAL  Final  Blood Culture (routine x 2)     Status: None (Preliminary result)   Collection Time: 05/15/23  9:15 PM   Specimen: BLOOD  Result Value Ref Range Status   Specimen Description BLOOD SITE NOT SPECIFIED  Final    Special Requests   Final    BOTTLES DRAWN AEROBIC AND ANAEROBIC Blood Culture adequate volume   Culture   Final    NO GROWTH < 12 HOURS Performed at [facility], [address], [place], [place] [address]    Report Status PENDING  Incomplete     Labs: BNP (last 3 results) Recent Labs    08/10/22 0231 08/12/22 1352 08/12/22 1829  BNP 713.2* 555.6* 497.2*   Basic Metabolic Panel: Recent Labs  Lab 05/10/23 1754 05/10/23 1818 05/11/23 0445 05/15/23 2026 05/15/23 2037 05/16/23 0428  NA 131* 133* 131* 129* 131* 131*  K 4.7 4.4 3.8 3.8 3.9 3.6  CL 98 97* 101 97* 99 98  CO2 22  --  21* 18*  --  20*  GLUCOSE 288* 277* 201* 182* 180* 134*  BUN 25* 27* 17 17 18 15   CREATININE 0.89 0.70 0.64 1.90* 1.90* 1.40*  CALCIUM 8.5*  --  7.9* 8.4*  --  7.9*  MG  --   --  1.6*  --   --   --  PHOS  --   --  2.7  --   --   --    Liver Function Tests: Recent Labs  Lab 05/10/23 1754 05/11/23 0445 05/15/23 2026  AST 28 25 25   ALT 32 29 32  ALKPHOS 126 108 127*  BILITOT 0.9 0.8 0.6  PROT 6.8 6.0* 6.0*  ALBUMIN 3.8 3.3* 3.4*   Recent Labs  Lab 05/10/23 1754 05/15/23 2026  LIPASE 52* 28   No results for input(s): "AMMONIA" in the last 168 hours. CBC: Recent Labs  Lab 05/10/23 1754 05/10/23 1818 05/11/23 0445 05/12/23 0406 05/13/23 0334 05/15/23 2026 05/15/23 2037 05/16/23 0428  WBC 7.2  --  6.6 5.7 5.5 7.7  --  6.4  NEUTROABS 4.7  --   --   --   --   --   --   --   HGB 13.0   < > 11.3* 10.2* 10.2* 10.0* 11.6* 9.3*  HCT 38.3   < > 33.9* 30.5* 30.5* 30.2* 34.0* 28.2*  MCV 91.6  --  92.1 92.7 93.6 93.8  --  95.3  PLT 169  --  141* 128* 131* 165  --  148*   < > = values in this interval not displayed.   Cardiac Enzymes: No results for input(s): "CKTOTAL", "CKMB", "CKMBINDEX", "TROPONINI" in the last 168 hours. BNP: Invalid input(s): "POCBNP" CBG: Recent Labs  Lab 05/13/23 0718 05/13/23 1147 05/16/23 0409 05/16/23 0815 05/16/23 1121  GLUCAP 238* 254* 129*  122* 141*   D-Dimer No results for input(s): "DDIMER" in the last 72 hours. Hgb A1c No results for input(s): "HGBA1C" in the last 72 hours. Lipid Profile No results for input(s): "CHOL", "HDL", "LDLCALC", "TRIG", "CHOLHDL", "LDLDIRECT" in the last 72 hours. Thyroid function studies No results for input(s): "TSH", "T4TOTAL", "T3FREE", "THYROIDAB" in the last 72 hours.  Invalid input(s): "FREET3" Anemia work up No results for input(s): "VITAMINB12", "FOLATE", "FERRITIN", "TIBC", "IRON", "RETICCTPCT" in the last 72 hours. Urinalysis    Component Value Date/Time   COLORURINE YELLOW 05/16/2023 0026   APPEARANCEUR HAZY (A) 05/16/2023 0026   LABSPEC 1.021 05/16/2023 0026   PHURINE 5.0 05/16/2023 0026   GLUCOSEU NEGATIVE 05/16/2023 0026   GLUCOSEU NEG mg/dL 40/98/1191 4782   HGBUR NEGATIVE 05/16/2023 0026   HGBUR negative 02/27/2007 1327   BILIRUBINUR NEGATIVE 05/16/2023 0026   KETONESUR NEGATIVE 05/16/2023 0026   PROTEINUR NEGATIVE 05/16/2023 0026   UROBILINOGEN 1.0 06/10/2010 1729   NITRITE NEGATIVE 05/16/2023 0026   LEUKOCYTESUR NEGATIVE 05/16/2023 0026   Sepsis Labs Recent Labs  Lab 05/12/23 0406 05/13/23 0334 05/15/23 2026 05/16/23 0428  WBC 5.7 5.5 7.7 6.4   Microbiology Recent Results (from the past 240 hour(s))  Urine Culture (for pregnant, neutropenic or urologic patients or patients with an indwelling urinary catheter)     Status: Abnormal   Collection Time: 05/10/23  5:25 PM   Specimen: Urine, Clean Catch  Result Value Ref Range Status   Specimen Description   Final    URINE, CLEAN CATCH Performed at [facility], [address] [place], [place] [address]    Special Requests   Final    NONE Performed at [facility], [address], [place], [place] [address]    Culture >=100,000 COLONIES/mL ESCHERICHIA COLI (A)  Final   Report Status 05/12/2023 FINAL  Final   Organism ID, Bacteria ESCHERICHIA COLI (A)  Final       Susceptibility   Escherichia coli - MIC*    AMPICILLIN >=  32 RESISTANT Resistant     CEFAZOLIN <=4 SENSITIVE Sensitive     CEFEPIME <=0.12 SENSITIVE Sensitive     CEFTRIAXONE <=0.25 SENSITIVE Sensitive     CIPROFLOXACIN >=4 RESISTANT Resistant     GENTAMICIN <=1 SENSITIVE Sensitive     IMIPENEM <=0.25 SENSITIVE Sensitive     NITROFURANTOIN <=16 SENSITIVE Sensitive     TRIMETH/SULFA >=320 RESISTANT Resistant     AMPICILLIN/SULBACTAM 16 INTERMEDIATE Intermediate     PIP/TAZO <=4 SENSITIVE Sensitive     * >=100,000 COLONIES/mL ESCHERICHIA COLI  Blood culture (routine x 2)     Status: None   Collection Time: 05/10/23  9:10 PM   Specimen: BLOOD  Result Value Ref Range Status   Specimen Description   Final    BLOOD BLOOD RIGHT ARM Performed at [facility], [address] [place], [place] [address]    Special Requests   Final    BOTTLES DRAWN AEROBIC AND ANAEROBIC Blood Culture results may not be optimal due to an inadequate volume of blood received in culture bottles Performed at [facility], [address] [place], [place] [address]    Culture   Final    NO GROWTH 5 DAYS Performed at [facility], [address], [place], [place] [address]    Report Status 05/15/2023 FINAL  Final  Blood culture (routine x 2)     Status: None   Collection Time: 05/11/23  4:46 AM   Specimen: BLOOD  Result Value Ref Range Status   Specimen Description   Final    BLOOD BLOOD RIGHT ARM AEROBIC BOTTLE ONLY Performed at [facility], [address], [place], [place] [address]    Special Requests   Final    BOTTLES DRAWN AEROBIC ONLY Blood Culture adequate volume Performed at [facility], [address] [place], [place] [address]    Culture   Final    NO GROWTH 5 DAYS Performed at [facility], [address], [place], [place] [address]    Report Status 05/16/2023 FINAL  Final  Blood Culture (routine x 2)      Status: None (Preliminary result)   Collection Time: 05/15/23  9:15 PM   Specimen: BLOOD  Result Value Ref Range Status   Specimen Description BLOOD SITE NOT SPECIFIED  Final   Special Requests   Final    BOTTLES DRAWN AEROBIC AND ANAEROBIC Blood Culture adequate volume   Culture   Final    NO GROWTH < 12 HOURS Performed at [facility], [address], [place], [place] [address]    Report Status PENDING  Incomplete     Time coordinating discharge: 35 minutes  SIGNED:   Erick Blinks, DO [facility] 05/16/2023, 11:55 AM  If 7PM-7AM, please contact night-coverage [email or web address]

## 2023-05-16 NOTE — Assessment & Plan Note (Addendum)
Very likely pre-renal / ATN in setting of hypotension + very poor PO intake over past 2 days since discharge.  Suspect poor PO intake secondary to her advanced stage 4 pancreatic cancer. IVF Hold home BP meds Strict intake and output Repeat BMP in AM No evidence of obstruction on CT today. No evidence of recurrent UTI on UA today and symptoms had improved per patient.

## 2023-05-16 NOTE — Assessment & Plan Note (Addendum)
Cont coumadin Hold metoprolol due to soft BPs. Currently S.Tach to 125, but doesn't look like s.tach is new and pt also confirms that this is ongoing / chronic. Was S.Tach to 129 at time of discharge 2 days ago.

## 2023-05-16 NOTE — Progress Notes (Signed)
[facility]) Hospitalized Hospice Patient Note:  Ms. Funchess was admitted on [facility] services on 05/13/23 upon discharge from [facility] last week with a terminal hospice diagnosis of Abnormal Weight Loss. Over the weekend, the Patient experienced a fall in the home sustaining a head laceration after complaints of pain and dizziness. Patient was then transported to [facility] for evaluation late last night then admitted to the [facility] 2W30 for observation post status fall. Per Dr. Anne Fu, an [facility] physician, this is a related hospital admission.   If medically stable, [facility] has offered the patient a bed to transfer after 2pm today for pain symptom management. Once discharged, Please use [facility] services for transport as [facility] contracts with them for all their patient's transportation needs.   Please send DNR with patient to [facility] and have bedside RN call report to [phone].   In addition, [facility] and [facility] have been in conversation and working together to complete the proper paperwork needed for the extra support, through Medicaid, in the home so patient can safely discharge back home after [facility] has been able to manage her symptoms.    Please feel free to reach out if you have any hospice related questions,  Roda Shutters, RN  RN PRN/Hospital Liaison  (On AMION) [phone]

## 2023-05-16 NOTE — Progress Notes (Signed)
[name] is a 62 y.o. female with medical history significant of stage 4 pancreatic cancer, PAF on coumadin, HTN, DM2.   Pt recently admitted 6/4-6/7 with UTI, treated with rocephin for a sensitive e.coli, discharged home with home hospice.  Per that DC summary: "She was evaluated by physical therapy as well during hospitalization and not felt to warrant needing SNF. Patient was unable to afford assisted or independent living and therefore she was arranged for home health aide and RN at discharge as well as establishing care with hospice. Given her poor prognosis and expected physical decline, she may warrant further hospitalization and/or placement with residential hospice once she is no longer able to live alone as she has no reliable care from family/friends."   Since discharge she has had poor oral intake and worsening generalized weakness.  She was admitted with AKI-prerenal in the setting of hypotension and poor oral intake in the setting of her advanced pancreatic cancer.  She was started on IV fluid with improvement in creatinine levels noted, but not quite at baseline.    Patient seen and evaluated at bedside and has been admitted after midnight.  Plan to continue IV fluids through today and have palliative see patient for further disposition planning with likely need for long-term care with hospice versus inpatient hospice.  A.m. labs ordered.  Total care time: 25 minutes.

## 2023-05-16 NOTE — TOC Initial Note (Addendum)
Transition of Care [facility]) - Initial/Assessment Note    Patient Details  Name: [name] MRN: [id number] Date of Birth: 1960-12-10  Transition of Care [facility]) CM/SW Contact:    Janae Bridgeman, RN Phone Number: 05/16/2023, 11:16 AM  Clinical Narrative:                 CM met with the patient at the bedside to discuss TOC needs.  The patient was admitted to the hospital and is currently being evaluated [facility] for hopeful admission to the facility for symptom management.  The patient lives at home and is followed by [facility] for services.  The patient is unable to care for herself at this time and the sister is unable to provide 24 hour care in the home.  DME in the home at this time is WC, RW and Cane.    The patient would like to transition to [facility] place for 1-2 weeks and then return home with [facility] through [facility] rather than going to LTC facility for care.  Patient makes 1200.00 per month disability.  The patient states that she does not have PCS Services at this time- I will complete the paperwork and send in to [facility] for CSW at [facility] place to have attending MD sign and expedite to the [place] lifts provider.  I spoke with Felicita Gage, LCSW at [facility] and she is aware and will update Summer Wright at [place] place to follow up.  05/16/23 1229 - Patient was accepted  at [facility]  [facility] will be contacted now to transport the patient after 2 pm to the facility for care.  Bedside nursing - please see Roda Shutters note to call report to the facility.  Ambulance packet is at the secretary's desk.  Expected Discharge Plan: Hospice Medical Facility Barriers to Discharge: Continued Medical Work up   Patient Goals and CMS Choice Patient states their goals for this hospitalization and ongoing recovery are:: Admit to [facility] for symptom management CMS Medicare.gov Compare Post Acute Care list provided to:: Patient  Represenative (must comment) (sister is present at the bedside) Choice offered to / list presented to : Patient [place] ownership interest in [facility].provided to:: Patient    Expected Discharge Plan and Services In-house Referral: Clinical Social Work, Hospice / Palliative Care Discharge Planning Services: CM Consult   Living arrangements for the past 2 months: Apartment                                      Prior Living Arrangements/Services Living arrangements for the past 2 months: Apartment Lives with:: Self Patient language and need for interpreter reviewed:: Yes Do you feel safe going back to the place where you live?: No (Patient lives alone and will transition to [facility] for symptom management)      Need for Family Participation in Patient Care: Yes (Comment) Care giver support system in place?: Yes (comment) Current home services: DME, Hospice (DME at the home includes WC, RW, Cane, [facility] is active with the patient,  Patient will be re-establishes for PCS services by [facility] MSW) Criminal Activity/Legal Involvement Pertinent to Current Situation/Hospitalization: No - Comment as needed  Activities of Daily Living Home Assistive Devices/Equipment: Dentures (specify type), Eyeglasses ADL Screening (condition at time of admission) Patient's cognitive ability adequate to safely complete daily activities?: Yes Is the patient deaf  or have difficulty hearing?: No Does the patient have difficulty seeing, even when wearing glasses/contacts?: No Does the patient have difficulty concentrating, remembering, or making decisions?: No Patient able to express need for assistance with ADLs?: Yes Does the patient have difficulty dressing or bathing?: No Independently performs ADLs?: Yes (appropriate for developmental age) Does the patient have difficulty walking or climbing stairs?: Yes Weakness of Legs: Both Weakness of Arms/Hands:  None  Permission Sought/Granted Permission sought to share information with : Case Manager, Family Supports, Oceanographer granted to share information with : Yes, Verbal Permission Granted     Permission granted to share info w AGENCY: [facility] to evaluate for Inpatient hospice placement for symptom management  Permission granted to share info w Relationship: sister - Alvira Philips is at the bedside     Emotional Assessment Appearance:: Appears stated age Attitude/Demeanor/Rapport: Gracious Affect (typically observed): Accepting Orientation: : Oriented to Self, Oriented to Place, Oriented to  Time, Oriented to Situation Alcohol / Substance Use: Not Applicable Psych Involvement: No (comment)  Admission diagnosis:  Syncope and collapse [R55] Dehydration [E86.0] Failure to thrive in adult [R62.7] Protein-calorie malnutrition, severe (HCC) [E43] AKI (acute kidney injury) (HCC) [N17.9] Contusion of face, initial encounter [S00.83XA] Forehead laceration, initial encounter [S01.81XA] 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) [C79.9, C25.9] Hypotension due to hypovolemia [E86.1] Patient Active Problem List   Diagnosis Date Noted   AKI (acute kidney injury) (HCC) 05/15/2023   Palliative care patient 05/13/2023   DNR (do not resuscitate) 05/11/2023   Cancer related pain 05/11/2023   Complicated UTI (urinary tract infection) 05/10/2023   Goals of care, counseling/discussion 05/02/2023   Itching 05/01/2023   Counseling and coordination of care 05/01/2023   Medication management 04/30/2023   Pain of upper abdomen 04/28/2023   High risk medication use 04/28/2023   Need for emotional support 04/28/2023   Constipation 04/28/2023   Pancreatic cancer metastasized to liver (HCC) 04/28/2023   Palliative care encounter 04/28/2023   Essential hypertension 04/27/2023   Thrombocytopenia (HCC) 04/27/2023   Intractable pain 04/26/2023   Pancreatic cancer  (HCC) 04/12/2023   Pancreatic mass 04/04/2023   Mass of pancreas 04/03/2023   Chronic kidney disease, stage 3a (HCC) 04/03/2023   Pulmonary nodules 04/03/2023   Mixed hyperlipidemia 02/28/2023   Rheumatic disease of heart valve 11/19/2022   COPD (chronic obstructive pulmonary disease) (HCC) 08/05/2022   Anxiety and depression 08/05/2022   QT prolongation 08/05/2022   Benign neoplasm of ascending colon    AVM (arteriovenous malformation) of colon without hemorrhage    Benign neoplasm of cecum    Benign neoplasm of transverse colon    Normocytic anemia 07/23/2022   Pain due to onychomycosis of toenails of both feet 06/23/2022   Encounter for therapeutic drug monitoring 06/11/2022   Hypomagnesemia 05/24/2022   Long term current use of anticoagulant therapy 05/17/2022   S/P MVR/TVR  04/19/22 05/17/2022   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 05/17/2022   PAF (paroxysmal atrial fibrillation) (HCC)    Pain of left hand 01/01/2021   Nasal vestibulitis 04/25/2020   FH: CHF (congestive heart failure) 04/05/2020   Acquired hallux interphalangeus 02/26/2020   Hallux rigidus 02/26/2020   Retained orthopedic hardware 02/26/2020   Mechanical complication associated with orthopedic device (HCC) 08/31/2019   Hypercholesterolemia 08/17/2019   Lateral epicondylitis of right elbow 03/31/2018   Arthritis of knee 03/09/2018   Arthritis of foot, degenerative 02/28/2018   Hallux valgus (acquired), left foot 02/28/2018   Long term current  use of oral hypoglycemic drug 01/16/2018   Pharyngeal dysphagia 12/21/2017   Left thyroid nodule 12/21/2017   History of colon polyps 09/21/2017   History of atrial fibrillation 08/23/2017   GERD (gastroesophageal reflux disease) 08/23/2017   Presbyopia of both eyes 07/15/2017   Nuclear sclerotic cataract of both eyes 07/15/2017   Dry eye syndrome of both lacrimal glands 07/15/2017   Acquired trigger finger of left ring finger 05/04/2017   Hypoglycemia  secondary to sulfonylurea 01/17/2017   Paroxysmal atrial fibrillation (HCC) 12/30/2016   Osteoarthritis 12/30/2016   NSAID long-term use 12/30/2016   DDD (degenerative disc disease), lumbar 11/26/2015   Facet syndrome, lumbar 11/26/2015   Greater trochanteric bursitis 11/26/2015   Diabetic neuropathy (HCC) 11/26/2015   Insomnia 11/11/2015   PTSD (post-traumatic stress disorder) 11/11/2015   GAD (generalized anxiety disorder) 11/11/2015   Severe episode of recurrent major depressive disorder, without psychotic features (HCC) 11/11/2015   Tobacco use 06/27/2012   Sacroiliac joint dysfunction 05/18/2012   Right low back pain 04/19/2012   Cervical neck pain with evidence of disc disease 02/11/2012   Trochanteric bursitis of right hip 02/11/2012   Hematochezia 04/29/2011   Plantar fasciitis 03/11/2011   Acute cystitis 06/19/2010   INSOMNIA 01/18/2008   Chronic pain syndrome 12/15/2007   DIABETIC PERIPHERAL NEUROPATHY 05/08/2007   HEPATITIS C 02/27/2007   Type 2 diabetes mellitus with hyperlipidemia (HCC) 02/27/2007   Hypertension associated with diabetes (HCC) 02/27/2007   PCP:  Chrys Racer, MD Pharmacy:   [facility] [address] - [place], [place] - [address] [place] [address] Phone: [phone] Fax: [phone]  [facility] - [place], [place] - [address] [place] [address] Phone: [phone] Fax: [phone]  [id number] - [address] [place] - [address] [facility] [address] [address] [place] [address] Phone: [phone] Fax: [phone]  [facility] - [facility] [address] [place] [address] Phone: [phone] Fax: [phone]     Social Determinants of Health (SDOH) Social History: SDOH Screenings   Food Insecurity: No Food Insecurity (05/16/2023)  Housing: Low Risk  (05/16/2023)  Transportation Needs: No Transportation Needs  (05/16/2023)  Utilities: Not At Risk (05/16/2023)  Recent Concern: Utilities - At Risk (04/04/2023)  Tobacco Use: High Risk (05/16/2023)   SDOH Interventions:     Readmission Risk Interventions    05/11/2023   11:31 AM 04/08/2023    2:15 PM 08/06/2022    2:13 PM  Readmission Risk Prevention Plan  Transportation Screening Complete Complete Complete  Medication Review Oceanographer) Complete  Complete  PCP or Specialist appointment within 3-5 days of discharge Complete  Complete  HRI or Home Care Consult Complete Complete Complete  SW Recovery Care/Counseling Consult Complete Complete Complete  Palliative Care Screening Complete Not Applicable Not Applicable  Skilled Nursing Facility Not Applicable Not Applicable Not Applicable

## 2023-05-17 LAB — CULTURE, BLOOD (ROUTINE X 2): Culture: NO GROWTH

## 2023-05-19 ENCOUNTER — Other Ambulatory Visit: Payer: Self-pay

## 2023-05-19 ENCOUNTER — Other Ambulatory Visit (HOSPITAL_COMMUNITY): Payer: Self-pay

## 2023-05-19 LAB — CULTURE, BLOOD (ROUTINE X 2): Culture: NO GROWTH

## 2023-05-19 MED ORDER — ASPIRIN 325 MG PO TABS
325.0000 mg | ORAL_TABLET | Freq: Every day | ORAL | 0 refills | Status: DC
  Filled 2023-05-19: qty 7, 7d supply, fill #0

## 2023-05-20 ENCOUNTER — Other Ambulatory Visit (HOSPITAL_COMMUNITY): Payer: Self-pay

## 2023-05-20 LAB — CULTURE, BLOOD (ROUTINE X 2): Special Requests: ADEQUATE

## 2023-05-21 LAB — CULTURE, BLOOD (ROUTINE X 2): Special Requests: ADEQUATE

## 2023-05-25 ENCOUNTER — Other Ambulatory Visit (HOSPITAL_COMMUNITY): Payer: Self-pay

## 2023-05-26 ENCOUNTER — Encounter (HOSPITAL_COMMUNITY): Payer: Self-pay | Admitting: Hematology

## 2023-07-11 ENCOUNTER — Encounter (HOSPITAL_COMMUNITY): Payer: Self-pay | Admitting: Internal Medicine

## 2023-07-11 ENCOUNTER — Ambulatory Visit (HOSPITAL_COMMUNITY): Payer: 59 | Attending: Internal Medicine

## 2023-07-14 ENCOUNTER — Ambulatory Visit: Payer: 59 | Admitting: Podiatry

## 2023-07-31 ENCOUNTER — Other Ambulatory Visit: Payer: Self-pay | Admitting: Internal Medicine

## 2023-08-07 DEATH — deceased

## 2023-08-15 NOTE — Progress Notes (Deleted)
this morning and had elevated blood pressure requiring IV hydralazine and labetalol.  Findings Coronary Findings Diagnostic  Dominance: Right  No diagnostic findings have been documented. Intervention  No interventions have been documented.     ECHOCARDIOGRAM  ECHOCARDIOGRAM COMPLETE 08/13/2022  Narrative ECHOCARDIOGRAM REPORT    Patient Name:   [name] Date of Exam: 08/13/2022 Medical Rec #:  [id number]         Height:       71.0 in Accession #:    [id number]        Weight:       203.9 lb Date of Birth:  06-08-1961         BSA:          2.126 m Patient Age:    62 years          BP:           93/57 mmHg Patient Gender: F                 HR:           71 bpm. Exam Location:  Inpatient  Procedure: 2D Echo, Cardiac Doppler and Color Doppler  Indications:    Sinus bradycardia  History:        Patient has prior history of Echocardiogram examinations, most recent 07/28/2022. CHF, Mitral Valve Disease, Arrythmias:Atrial Fibrillation; Risk Factors:Hypertension and Diabetes.  Mitral Valve: prosthetic annuloplasty ring valve is present in the mitral position. Procedure Date: 04/19/22.  Sonographer:    Milda Smart Referring Phys: [id number] CHRISTOPHER J TEGELER  IMPRESSIONS   1. S/P MV repair with mild MS (1.74 cm2; mean gradient 7 mmHg; HR 70); mild MR. 2. Left ventricular ejection fraction, by estimation, is 60 to 65%. The left ventricle has normal function. The left ventricle has no regional wall motion abnormalities. There is moderate concentric left ventricular hypertrophy. Left  ventricular diastolic parameters are indeterminate. Elevated left atrial pressure. There is the interventricular septum is flattened in systole and diastole, consistent with right ventricular pressure and volume overload. 3. Right ventricular systolic function is normal. The right ventricular size is normal. There is mildly elevated pulmonary artery systolic pressure. 4. Left atrial size was moderately dilated. 5. Right atrial size was mildly dilated. 6. The mitral valve has been repaired/replaced. Mild mitral valve regurgitation. Mild mitral stenosis. 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 Procedure Date: 04/19/22. 7. Tricuspid valve regurgitation is mild to moderate. 8. The aortic valve is tricuspid. Aortic valve regurgitation is not visualized. No aortic stenosis is present. 9. The inferior vena cava is normal in size with greater than 50% respiratory variability, suggesting right atrial pressure of 3 mmHg.  FINDINGS Left Ventricle: Left ventricular ejection fraction, by estimation, is 60 to 65%. The left ventricle has normal function. The left ventricle has no regional wall motion abnormalities. The left ventricular internal cavity size was normal in size. There is moderate concentric left ventricular hypertrophy. The interventricular septum is flattened in systole and diastole, consistent with right ventricular pressure and volume overload. Left ventricular diastolic parameters are indeterminate. Elevated left atrial pressure.  Right Ventricle: The right ventricular size is normal. Right ventricular systolic function is normal. There is mildly elevated pulmonary artery systolic pressure. The tricuspid regurgitant velocity is 3.18 m/s, and with an assumed right atrial pressure of 3 mmHg, the estimated right ventricular systolic pressure is 43.4 mmHg.  Left Atrium: Left atrial size was moderately dilated.  Right Atrium: Right atrial size was  this morning and had elevated blood pressure requiring IV hydralazine and labetalol.  Findings Coronary Findings Diagnostic  Dominance: Right  No diagnostic findings have been documented. Intervention  No interventions have been documented.     ECHOCARDIOGRAM  ECHOCARDIOGRAM COMPLETE 08/13/2022  Narrative ECHOCARDIOGRAM REPORT    Patient Name:   [name] Date of Exam: 08/13/2022 Medical Rec #:  [id number]         Height:       71.0 in Accession #:    [id number]        Weight:       203.9 lb Date of Birth:  06-08-1961         BSA:          2.126 m Patient Age:    62 years          BP:           93/57 mmHg Patient Gender: F                 HR:           71 bpm. Exam Location:  Inpatient  Procedure: 2D Echo, Cardiac Doppler and Color Doppler  Indications:    Sinus bradycardia  History:        Patient has prior history of Echocardiogram examinations, most recent 07/28/2022. CHF, Mitral Valve Disease, Arrythmias:Atrial Fibrillation; Risk Factors:Hypertension and Diabetes.  Mitral Valve: prosthetic annuloplasty ring valve is present in the mitral position. Procedure Date: 04/19/22.  Sonographer:    Milda Smart Referring Phys: [id number] CHRISTOPHER J TEGELER  IMPRESSIONS   1. S/P MV repair with mild MS (1.74 cm2; mean gradient 7 mmHg; HR 70); mild MR. 2. Left ventricular ejection fraction, by estimation, is 60 to 65%. The left ventricle has normal function. The left ventricle has no regional wall motion abnormalities. There is moderate concentric left ventricular hypertrophy. Left  ventricular diastolic parameters are indeterminate. Elevated left atrial pressure. There is the interventricular septum is flattened in systole and diastole, consistent with right ventricular pressure and volume overload. 3. Right ventricular systolic function is normal. The right ventricular size is normal. There is mildly elevated pulmonary artery systolic pressure. 4. Left atrial size was moderately dilated. 5. Right atrial size was mildly dilated. 6. The mitral valve has been repaired/replaced. Mild mitral valve regurgitation. Mild mitral stenosis. 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 Procedure Date: 04/19/22. 7. Tricuspid valve regurgitation is mild to moderate. 8. The aortic valve is tricuspid. Aortic valve regurgitation is not visualized. No aortic stenosis is present. 9. The inferior vena cava is normal in size with greater than 50% respiratory variability, suggesting right atrial pressure of 3 mmHg.  FINDINGS Left Ventricle: Left ventricular ejection fraction, by estimation, is 60 to 65%. The left ventricle has normal function. The left ventricle has no regional wall motion abnormalities. The left ventricular internal cavity size was normal in size. There is moderate concentric left ventricular hypertrophy. The interventricular septum is flattened in systole and diastole, consistent with right ventricular pressure and volume overload. Left ventricular diastolic parameters are indeterminate. Elevated left atrial pressure.  Right Ventricle: The right ventricular size is normal. Right ventricular systolic function is normal. There is mildly elevated pulmonary artery systolic pressure. The tricuspid regurgitant velocity is 3.18 m/s, and with an assumed right atrial pressure of 3 mmHg, the estimated right ventricular systolic pressure is 43.4 mmHg.  Left Atrium: Left atrial size was moderately dilated.  Right Atrium: Right atrial size was  Vmax:      433.00 cm/s MV E velocity: 201.00 cm/s MV A velocity: 121.00 cm/s MV E/A ratio:  1.66  Olga Millers MD Electronically signed by Olga Millers MD Signature Date/Time: 08/13/2022/12:32:14 PM    Final   TEE  ECHO TEE 07/28/2022  Narrative TRANSESOPHOGEAL ECHO REPORT    Patient Name:   [name] Date of Exam: 07/28/2022 Medical Rec #:  [id number]         Height:       71.0 in Accession #:    [id number]        Weight:       205.0 lb Date of Birth:  1961-04-07         BSA:          2.131 m Patient Age:    62 years          BP:           114/72 mmHg Patient Gender: F                 HR:           83 bpm. Exam Location:  Inpatient  Procedure: Transesophageal Echo, 3D Echo, Color Doppler and Cardiac Doppler  Indications:     elevated mitral valve gradients  History:         Patient has prior history of Echocardiogram examinations, most recent 07/27/2022. Risk Factors:Diabetes, Hypertension, GERD and Current Smoker.  Mitral Valve: prosthetic annuloplasty ring valve is present in the mitral position. Procedure Date: 04/19/22.  Sonographer:     Eulah Pont RDCS Referring Phys:  [id number] [facility] A Izora Ribas Diagnosing Phys: Thurmon Fair MD  PROCEDURE: After discussion of the risks and benefits of a TEE, an informed consent was obtained from the patient. The transesophogeal probe was passed without difficulty through the esophogus of the patient. Sedation performed by different physician. The  patient was monitored while under deep sedation. Anesthestetic sedation was provided intravenously by Anesthesiology: 137.65mg  of Propofol, 80mg  of Lidocaine. The patient's vital signs; including heart rate, blood pressure, and oxygen saturation; remained stable throughout the procedure. The patient developed no complications during the procedure.  IMPRESSIONS   1. Left ventricular ejection fraction, by estimation, is 60 to 65%. The left ventricle has normal function. The left ventricle has no regional wall motion abnormalities. There is mild concentric left ventricular hypertrophy. Left ventricular diastolic function could not be evaluated. 2. Right ventricular systolic function is normal. The right ventricular size is normal. There is moderately elevated pulmonary artery systolic pressure. The estimated right ventricular systolic pressure is 54.6 mmHg. 3. Left atrial size was severely dilated. No left atrial/left atrial appendage thrombus was detected. The LAA emptying velocity was 70 cm/s. 4. Right atrial size was mildly dilated. 5. The mitral valve has been repaired/replaced. Mild mitral valve regurgitation. Mild mitral stenosis. The mean mitral valve gradient is 6.6 mmHg with average heart rate of 83 bpm. 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 Procedure Date: 04/19/22. 6. The tricuspid valve is has been repaired/replaced. 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 Tricuspid valve regurgitation is moderate. 7. The aortic valve is tricuspid. Aortic valve regurgitation is not visualized. No aortic stenosis is present.  Comparison(s): A "double envelope" or "ghosting" artifact is seen during CW interrogation of the mitral inflow. This may have led to overestimation of the transmitral gradient on the previous study.  FINDINGS Left Ventricle: Left ventricular ejection fraction, by estimation, is  Cardiology Office Note:    Date:  08/15/2023   ID:  Capri Huppert, DOB February 23, 1961, MRN [id number]  PCP:  Chrys Racer, MD   [facility] Providers Cardiologist:  Christell Constant, MD     Referring MD: Chrys Racer, MD   CC:  Hospital follow up  History of Present Illness:    Stephy Mccoppin is a 62 y.o. female with a hx of rheumatic heart disease (MV and TV) who underwent MVR and TVR in 2023.  She has had 10 ED visits or hospitalizations since surgery in May. 2023Gilberto Better a pericardial effusion and tamponade in 05/2022.  She has had GI bleed and hypotension, she was last returned to warfarin, she has received blood transfusions and GI evaluation.  She had had hypotension and at one point was on midodrine.  She has had repeat TEE without evidence of severe MS.  She had profound bradycardia with confusion related to her beta blockers.  She had had severe electrolyte derrangement. She was diagnosed with COPD.  Labile BP with Pain med we backed of some of her AV nodal therapy. 2024: needed some lasix every day down to a few.  She was doing well after difficult 2023.  After stabilization of her cardiovascular disease she was found to have pancreatic cancer- then found to be metastatic.  She has had multiple hospitalizations and is not transitioning to hospice care.  Patient notes that she is doing ***.   Since last visit notes *** . There are no*** interval hospital/ED visit.   EKG showed ***  No chest pain or pressure ***.  No SOB/DOE*** and no PND/Orthopnea***.  No weight gain or leg swelling***.  No palpitations or syncope ***.  Ambulatory blood pressure ***.   Past Medical History:  Diagnosis Date   Acute pericardial effusion 05/17/2022   AKI (acute kidney injury) (HCC) 05/17/2022   Anxiety    Arthritis    Cervicalgia    CHF (congestive heart failure) (HCC)    Chondromalacia of patella    Chronic neck pain    Chronic pain syndrome    Depression     secondary to loss of her son at age 63   Diabetic neuropathy (HCC)    DMII (diabetes mellitus, type 2) (HCC)    Dysrhythmia    Enthesopathy of hip region    Fibromyalgia    GERD (gastroesophageal reflux disease)    Hep C w/o coma, chronic (HCC)    Hepatitis C    diagnosed 2005   Hypertension    Insomnia    Low back pain    Lumbago    Mitral regurgitation    Obesity    Pain in joint, upper arm    Primary localized osteoarthrosis, lower leg    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   Substance abuse (HCC)    sober since 2001   Tobacco abuse    Tricuspid regurgitation    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    Past Surgical History:  Procedure Laterality Date   ABDOMINAL HYSTERECTOMY     at age 41, unknown reasons   BIOPSY  04/07/2023   Procedure: BIOPSY;  Surgeon: Lemar Lofty., MD;  Location: [facility];  Service: Gastroenterology;;   Fidela Salisbury RELEASE  12/07/2011   left side   COLONOSCOPY WITH PROPOFOL N/A 07/30/2022   Procedure: COLONOSCOPY WITH PROPOFOL;  Surgeon: Meryl Dare, MD;  Location: [facility];  Service: Gastroenterology;  Laterality: N/A;   ESOPHAGOGASTRODUODENOSCOPY (  Vmax:      433.00 cm/s MV E velocity: 201.00 cm/s MV A velocity: 121.00 cm/s MV E/A ratio:  1.66  Olga Millers MD Electronically signed by Olga Millers MD Signature Date/Time: 08/13/2022/12:32:14 PM    Final   TEE  ECHO TEE 07/28/2022  Narrative TRANSESOPHOGEAL ECHO REPORT    Patient Name:   [name] Date of Exam: 07/28/2022 Medical Rec #:  [id number]         Height:       71.0 in Accession #:    [id number]        Weight:       205.0 lb Date of Birth:  1961-04-07         BSA:          2.131 m Patient Age:    62 years          BP:           114/72 mmHg Patient Gender: F                 HR:           83 bpm. Exam Location:  Inpatient  Procedure: Transesophageal Echo, 3D Echo, Color Doppler and Cardiac Doppler  Indications:     elevated mitral valve gradients  History:         Patient has prior history of Echocardiogram examinations, most recent 07/27/2022. Risk Factors:Diabetes, Hypertension, GERD and Current Smoker.  Mitral Valve: prosthetic annuloplasty ring valve is present in the mitral position. Procedure Date: 04/19/22.  Sonographer:     Eulah Pont RDCS Referring Phys:  [id number] [facility] A Izora Ribas Diagnosing Phys: Thurmon Fair MD  PROCEDURE: After discussion of the risks and benefits of a TEE, an informed consent was obtained from the patient. The transesophogeal probe was passed without difficulty through the esophogus of the patient. Sedation performed by different physician. The  patient was monitored while under deep sedation. Anesthestetic sedation was provided intravenously by Anesthesiology: 137.65mg  of Propofol, 80mg  of Lidocaine. The patient's vital signs; including heart rate, blood pressure, and oxygen saturation; remained stable throughout the procedure. The patient developed no complications during the procedure.  IMPRESSIONS   1. Left ventricular ejection fraction, by estimation, is 60 to 65%. The left ventricle has normal function. The left ventricle has no regional wall motion abnormalities. There is mild concentric left ventricular hypertrophy. Left ventricular diastolic function could not be evaluated. 2. Right ventricular systolic function is normal. The right ventricular size is normal. There is moderately elevated pulmonary artery systolic pressure. The estimated right ventricular systolic pressure is 54.6 mmHg. 3. Left atrial size was severely dilated. No left atrial/left atrial appendage thrombus was detected. The LAA emptying velocity was 70 cm/s. 4. Right atrial size was mildly dilated. 5. The mitral valve has been repaired/replaced. Mild mitral valve regurgitation. Mild mitral stenosis. The mean mitral valve gradient is 6.6 mmHg with average heart rate of 83 bpm. 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 Procedure Date: 04/19/22. 6. The tricuspid valve is has been repaired/replaced. 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 Tricuspid valve regurgitation is moderate. 7. The aortic valve is tricuspid. Aortic valve regurgitation is not visualized. No aortic stenosis is present.  Comparison(s): A "double envelope" or "ghosting" artifact is seen during CW interrogation of the mitral inflow. This may have led to overestimation of the transmitral gradient on the previous study.  FINDINGS Left Ventricle: Left ventricular ejection fraction, by estimation, is  this morning and had elevated blood pressure requiring IV hydralazine and labetalol.  Findings Coronary Findings Diagnostic  Dominance: Right  No diagnostic findings have been documented. Intervention  No interventions have been documented.     ECHOCARDIOGRAM  ECHOCARDIOGRAM COMPLETE 08/13/2022  Narrative ECHOCARDIOGRAM REPORT    Patient Name:   [name] Date of Exam: 08/13/2022 Medical Rec #:  [id number]         Height:       71.0 in Accession #:    [id number]        Weight:       203.9 lb Date of Birth:  06-08-1961         BSA:          2.126 m Patient Age:    62 years          BP:           93/57 mmHg Patient Gender: F                 HR:           71 bpm. Exam Location:  Inpatient  Procedure: 2D Echo, Cardiac Doppler and Color Doppler  Indications:    Sinus bradycardia  History:        Patient has prior history of Echocardiogram examinations, most recent 07/28/2022. CHF, Mitral Valve Disease, Arrythmias:Atrial Fibrillation; Risk Factors:Hypertension and Diabetes.  Mitral Valve: prosthetic annuloplasty ring valve is present in the mitral position. Procedure Date: 04/19/22.  Sonographer:    Milda Smart Referring Phys: [id number] CHRISTOPHER J TEGELER  IMPRESSIONS   1. S/P MV repair with mild MS (1.74 cm2; mean gradient 7 mmHg; HR 70); mild MR. 2. Left ventricular ejection fraction, by estimation, is 60 to 65%. The left ventricle has normal function. The left ventricle has no regional wall motion abnormalities. There is moderate concentric left ventricular hypertrophy. Left  ventricular diastolic parameters are indeterminate. Elevated left atrial pressure. There is the interventricular septum is flattened in systole and diastole, consistent with right ventricular pressure and volume overload. 3. Right ventricular systolic function is normal. The right ventricular size is normal. There is mildly elevated pulmonary artery systolic pressure. 4. Left atrial size was moderately dilated. 5. Right atrial size was mildly dilated. 6. The mitral valve has been repaired/replaced. Mild mitral valve regurgitation. Mild mitral stenosis. 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 Procedure Date: 04/19/22. 7. Tricuspid valve regurgitation is mild to moderate. 8. The aortic valve is tricuspid. Aortic valve regurgitation is not visualized. No aortic stenosis is present. 9. The inferior vena cava is normal in size with greater than 50% respiratory variability, suggesting right atrial pressure of 3 mmHg.  FINDINGS Left Ventricle: Left ventricular ejection fraction, by estimation, is 60 to 65%. The left ventricle has normal function. The left ventricle has no regional wall motion abnormalities. The left ventricular internal cavity size was normal in size. There is moderate concentric left ventricular hypertrophy. The interventricular septum is flattened in systole and diastole, consistent with right ventricular pressure and volume overload. Left ventricular diastolic parameters are indeterminate. Elevated left atrial pressure.  Right Ventricle: The right ventricular size is normal. Right ventricular systolic function is normal. There is mildly elevated pulmonary artery systolic pressure. The tricuspid regurgitant velocity is 3.18 m/s, and with an assumed right atrial pressure of 3 mmHg, the estimated right ventricular systolic pressure is 43.4 mmHg.  Left Atrium: Left atrial size was moderately dilated.  Right Atrium: Right atrial size was  Cardiology Office Note:    Date:  08/15/2023   ID:  Capri Huppert, DOB February 23, 1961, MRN [id number]  PCP:  Chrys Racer, MD   [facility] Providers Cardiologist:  Christell Constant, MD     Referring MD: Chrys Racer, MD   CC:  Hospital follow up  History of Present Illness:    Stephy Mccoppin is a 62 y.o. female with a hx of rheumatic heart disease (MV and TV) who underwent MVR and TVR in 2023.  She has had 10 ED visits or hospitalizations since surgery in May. 2023Gilberto Better a pericardial effusion and tamponade in 05/2022.  She has had GI bleed and hypotension, she was last returned to warfarin, she has received blood transfusions and GI evaluation.  She had had hypotension and at one point was on midodrine.  She has had repeat TEE without evidence of severe MS.  She had profound bradycardia with confusion related to her beta blockers.  She had had severe electrolyte derrangement. She was diagnosed with COPD.  Labile BP with Pain med we backed of some of her AV nodal therapy. 2024: needed some lasix every day down to a few.  She was doing well after difficult 2023.  After stabilization of her cardiovascular disease she was found to have pancreatic cancer- then found to be metastatic.  She has had multiple hospitalizations and is not transitioning to hospice care.  Patient notes that she is doing ***.   Since last visit notes *** . There are no*** interval hospital/ED visit.   EKG showed ***  No chest pain or pressure ***.  No SOB/DOE*** and no PND/Orthopnea***.  No weight gain or leg swelling***.  No palpitations or syncope ***.  Ambulatory blood pressure ***.   Past Medical History:  Diagnosis Date   Acute pericardial effusion 05/17/2022   AKI (acute kidney injury) (HCC) 05/17/2022   Anxiety    Arthritis    Cervicalgia    CHF (congestive heart failure) (HCC)    Chondromalacia of patella    Chronic neck pain    Chronic pain syndrome    Depression     secondary to loss of her son at age 63   Diabetic neuropathy (HCC)    DMII (diabetes mellitus, type 2) (HCC)    Dysrhythmia    Enthesopathy of hip region    Fibromyalgia    GERD (gastroesophageal reflux disease)    Hep C w/o coma, chronic (HCC)    Hepatitis C    diagnosed 2005   Hypertension    Insomnia    Low back pain    Lumbago    Mitral regurgitation    Obesity    Pain in joint, upper arm    Primary localized osteoarthrosis, lower leg    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   Substance abuse (HCC)    sober since 2001   Tobacco abuse    Tricuspid regurgitation    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    Past Surgical History:  Procedure Laterality Date   ABDOMINAL HYSTERECTOMY     at age 41, unknown reasons   BIOPSY  04/07/2023   Procedure: BIOPSY;  Surgeon: Lemar Lofty., MD;  Location: [facility];  Service: Gastroenterology;;   Fidela Salisbury RELEASE  12/07/2011   left side   COLONOSCOPY WITH PROPOFOL N/A 07/30/2022   Procedure: COLONOSCOPY WITH PROPOFOL;  Surgeon: Meryl Dare, MD;  Location: [facility];  Service: Gastroenterology;  Laterality: N/A;   ESOPHAGOGASTRODUODENOSCOPY (  Vmax:      433.00 cm/s MV E velocity: 201.00 cm/s MV A velocity: 121.00 cm/s MV E/A ratio:  1.66  Olga Millers MD Electronically signed by Olga Millers MD Signature Date/Time: 08/13/2022/12:32:14 PM    Final   TEE  ECHO TEE 07/28/2022  Narrative TRANSESOPHOGEAL ECHO REPORT    Patient Name:   [name] Date of Exam: 07/28/2022 Medical Rec #:  [id number]         Height:       71.0 in Accession #:    [id number]        Weight:       205.0 lb Date of Birth:  1961-04-07         BSA:          2.131 m Patient Age:    62 years          BP:           114/72 mmHg Patient Gender: F                 HR:           83 bpm. Exam Location:  Inpatient  Procedure: Transesophageal Echo, 3D Echo, Color Doppler and Cardiac Doppler  Indications:     elevated mitral valve gradients  History:         Patient has prior history of Echocardiogram examinations, most recent 07/27/2022. Risk Factors:Diabetes, Hypertension, GERD and Current Smoker.  Mitral Valve: prosthetic annuloplasty ring valve is present in the mitral position. Procedure Date: 04/19/22.  Sonographer:     Eulah Pont RDCS Referring Phys:  [id number] [facility] Izora Ribas Diagnosing Phys: Thurmon Fair MD  PROCEDURE: After discussion of the risks and benefits of a TEE, an informed consent was obtained from the patient. The transesophogeal probe was passed without difficulty through the esophogus of the patient. Sedation performed by different physician. The  patient was monitored while under deep sedation. Anesthestetic sedation was provided intravenously by Anesthesiology: 137.65mg  of Propofol, 80mg  of Lidocaine. The patient's vital signs; including heart rate, blood pressure, and oxygen saturation; remained stable throughout the procedure. The patient developed no complications during the procedure.  IMPRESSIONS   1. Left ventricular ejection fraction, by estimation, is 60 to 65%. The left ventricle has normal function. The left ventricle has no regional wall motion abnormalities. There is mild concentric left ventricular hypertrophy. Left ventricular diastolic function could not be evaluated. 2. Right ventricular systolic function is normal. The right ventricular size is normal. There is moderately elevated pulmonary artery systolic pressure. The estimated right ventricular systolic pressure is 54.6 mmHg. 3. Left atrial size was severely dilated. No left atrial/left atrial appendage thrombus was detected. The LAA emptying velocity was 70 cm/s. 4. Right atrial size was mildly dilated. 5. The mitral valve has been repaired/replaced. Mild mitral valve regurgitation. Mild mitral stenosis. The mean mitral valve gradient is 6.6 mmHg with average heart rate of 83 bpm. 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 Procedure Date: 04/19/22. 6. The tricuspid valve is has been repaired/replaced. 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 Tricuspid valve regurgitation is moderate. 7. The aortic valve is tricuspid. Aortic valve regurgitation is not visualized. No aortic stenosis is present.  Comparison(s): A "double envelope" or "ghosting" artifact is seen during CW interrogation of the mitral inflow. This may have led to overestimation of the transmitral gradient on the previous study.  FINDINGS Left Ventricle: Left ventricular ejection fraction, by estimation, is

## 2023-08-19 ENCOUNTER — Other Ambulatory Visit: Payer: Self-pay

## 2023-08-22 ENCOUNTER — Other Ambulatory Visit: Payer: Self-pay

## 2023-08-22 ENCOUNTER — Ambulatory Visit: Payer: Medicaid Other | Admitting: Internal Medicine

## 2024-08-23 ENCOUNTER — Other Ambulatory Visit (HOSPITAL_COMMUNITY): Payer: Self-pay
